# Patient Record
Sex: Male | Born: 1985
Health system: Southern US, Community
[De-identification: ages and names within clinical notes are randomized; demographics above are authoritative.]

## PROBLEM LIST (undated history)

## (undated) DIAGNOSIS — Z8719 Personal history of other diseases of the digestive system: Secondary | ICD-10-CM

## (undated) DIAGNOSIS — R011 Cardiac murmur, unspecified: Secondary | ICD-10-CM

## (undated) DIAGNOSIS — K279 Peptic ulcer, site unspecified, unspecified as acute or chronic, without hemorrhage or perforation: Secondary | ICD-10-CM

## (undated) DIAGNOSIS — K2210 Ulcer of esophagus without bleeding: Secondary | ICD-10-CM

## (undated) DIAGNOSIS — F329 Major depressive disorder, single episode, unspecified: Secondary | ICD-10-CM

## (undated) DIAGNOSIS — R519 Headache, unspecified: Secondary | ICD-10-CM

## (undated) DIAGNOSIS — D649 Anemia, unspecified: Secondary | ICD-10-CM

## (undated) DIAGNOSIS — Z992 Dependence on renal dialysis: Secondary | ICD-10-CM

## (undated) DIAGNOSIS — K209 Esophagitis, unspecified without bleeding: Secondary | ICD-10-CM

## (undated) DIAGNOSIS — R112 Nausea with vomiting, unspecified: Secondary | ICD-10-CM

## (undated) DIAGNOSIS — Z9889 Other specified postprocedural states: Secondary | ICD-10-CM

## (undated) DIAGNOSIS — N289 Disorder of kidney and ureter, unspecified: Secondary | ICD-10-CM

## (undated) DIAGNOSIS — G4700 Insomnia, unspecified: Secondary | ICD-10-CM

## (undated) DIAGNOSIS — F419 Anxiety disorder, unspecified: Secondary | ICD-10-CM

## (undated) DIAGNOSIS — N189 Chronic kidney disease, unspecified: Secondary | ICD-10-CM

## (undated) DIAGNOSIS — F32A Depression, unspecified: Secondary | ICD-10-CM

## (undated) DIAGNOSIS — G629 Polyneuropathy, unspecified: Secondary | ICD-10-CM

## (undated) DIAGNOSIS — R569 Unspecified convulsions: Secondary | ICD-10-CM

## (undated) DIAGNOSIS — R109 Unspecified abdominal pain: Secondary | ICD-10-CM

## (undated) DIAGNOSIS — E1043 Type 1 diabetes mellitus with diabetic autonomic (poly)neuropathy: Secondary | ICD-10-CM

## (undated) DIAGNOSIS — M419 Scoliosis, unspecified: Secondary | ICD-10-CM

## (undated) DIAGNOSIS — K3184 Gastroparesis: Secondary | ICD-10-CM

## (undated) DIAGNOSIS — M1990 Unspecified osteoarthritis, unspecified site: Secondary | ICD-10-CM

## (undated) DIAGNOSIS — M549 Dorsalgia, unspecified: Secondary | ICD-10-CM

## (undated) DIAGNOSIS — I10 Essential (primary) hypertension: Secondary | ICD-10-CM

## (undated) DIAGNOSIS — J189 Pneumonia, unspecified organism: Secondary | ICD-10-CM

## (undated) DIAGNOSIS — N186 End stage renal disease: Secondary | ICD-10-CM

## (undated) DIAGNOSIS — R51 Headache: Secondary | ICD-10-CM

## (undated) DIAGNOSIS — K219 Gastro-esophageal reflux disease without esophagitis: Secondary | ICD-10-CM

## (undated) DIAGNOSIS — E7800 Pure hypercholesterolemia, unspecified: Secondary | ICD-10-CM

## (undated) DIAGNOSIS — Q231 Congenital insufficiency of aortic valve: Secondary | ICD-10-CM

## (undated) DIAGNOSIS — E119 Type 2 diabetes mellitus without complications: Secondary | ICD-10-CM

## (undated) DIAGNOSIS — Q2381 Bicuspid aortic valve: Secondary | ICD-10-CM

## (undated) DIAGNOSIS — G8929 Other chronic pain: Secondary | ICD-10-CM

## (undated) HISTORY — PX: TYMPANOSTOMY TUBE PLACEMENT: SHX32

## (undated) HISTORY — PX: WISDOM TOOTH EXTRACTION: SHX21

## (undated) HISTORY — PX: EYE SURGERY: SHX253

## (undated) HISTORY — DX: Peptic ulcer, site unspecified, unspecified as acute or chronic, without hemorrhage or perforation: K27.9

---
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
## 2007-01-07 ENCOUNTER — Emergency Department (HOSPITAL_COMMUNITY): Admission: EM | Admit: 2007-01-07 | Discharge: 2007-01-08 | Payer: Self-pay | Admitting: Emergency Medicine

## 2007-02-07 ENCOUNTER — Emergency Department (HOSPITAL_COMMUNITY): Admission: EM | Admit: 2007-02-07 | Discharge: 2007-02-07 | Payer: Self-pay | Admitting: Emergency Medicine

## 2007-02-16 ENCOUNTER — Emergency Department (HOSPITAL_COMMUNITY): Admission: EM | Admit: 2007-02-16 | Discharge: 2007-02-16 | Payer: Self-pay | Admitting: Emergency Medicine

## 2007-02-23 ENCOUNTER — Emergency Department (HOSPITAL_COMMUNITY): Admission: EM | Admit: 2007-02-23 | Discharge: 2007-02-24 | Payer: Self-pay | Admitting: Emergency Medicine

## 2007-04-05 ENCOUNTER — Inpatient Hospital Stay (HOSPITAL_COMMUNITY): Admission: EM | Admit: 2007-04-05 | Discharge: 2007-04-07 | Payer: Self-pay | Admitting: Emergency Medicine

## 2009-07-06 ENCOUNTER — Encounter (INDEPENDENT_AMBULATORY_CARE_PROVIDER_SITE_OTHER): Payer: Self-pay | Admitting: Internal Medicine

## 2009-07-06 ENCOUNTER — Ambulatory Visit: Payer: Self-pay | Admitting: Cardiology

## 2010-01-13 ENCOUNTER — Inpatient Hospital Stay (HOSPITAL_COMMUNITY): Admission: EM | Admit: 2010-01-13 | Discharge: 2009-07-07 | Payer: Self-pay | Admitting: Emergency Medicine

## 2010-02-08 ENCOUNTER — Emergency Department (HOSPITAL_COMMUNITY)
Admission: EM | Admit: 2010-02-08 | Discharge: 2010-02-08 | Payer: Managed Care, Other (non HMO) | Source: Home / Self Care | Admitting: Emergency Medicine

## 2010-04-14 ENCOUNTER — Emergency Department (HOSPITAL_COMMUNITY): Payer: Managed Care, Other (non HMO)

## 2010-04-14 ENCOUNTER — Emergency Department (HOSPITAL_COMMUNITY)
Admission: EM | Admit: 2010-04-14 | Discharge: 2010-04-14 | Disposition: A | Discharge status: Home / Self Care | Payer: Managed Care, Other (non HMO) | Attending: Emergency Medicine | Admitting: Emergency Medicine

## 2010-04-14 DIAGNOSIS — S83106A Unspecified dislocation of unspecified knee, initial encounter: Secondary | ICD-10-CM | POA: Insufficient documentation

## 2010-04-14 DIAGNOSIS — X500XXA Overexertion from strenuous movement or load, initial encounter: Secondary | ICD-10-CM | POA: Insufficient documentation

## 2010-04-14 DIAGNOSIS — Y92009 Unspecified place in unspecified non-institutional (private) residence as the place of occurrence of the external cause: Secondary | ICD-10-CM | POA: Insufficient documentation

## 2010-04-14 LAB — GLUCOSE, CAPILLARY
Glucose-Capillary: 597 mg/dL (ref 70–99)
Glucose-Capillary: 314 mg/dL — ABNORMAL HIGH (ref 70–99)

## 2010-04-14 LAB — POCT I-STAT, CHEM 8: HCT: 45 % (ref 39.0–52.0)

## 2010-04-18 LAB — DIFFERENTIAL
Eosinophils Absolute: 0.1 10*3/uL (ref 0.0–0.7)
Eosinophils Relative: 1 % (ref 0–5)
Lymphocytes Relative: 34 % (ref 12–46)
Neutrophils Relative %: 60 % (ref 43–77)

## 2010-04-18 LAB — URINALYSIS, ROUTINE W REFLEX MICROSCOPIC
Glucose, UA: 500 mg/dL — AB
Protein, ur: 30 mg/dL — AB
Specific Gravity, Urine: >1.03 — ABNORMAL HIGH (ref 1.005–1.030)
Urobilinogen, UA: 0.2 mg/dL (ref 0.0–1.0)

## 2010-04-18 LAB — GLUCOSE, CAPILLARY
Glucose-Capillary: 229 mg/dL — ABNORMAL HIGH (ref 70–99)
Glucose-Capillary: 324 mg/dL — ABNORMAL HIGH (ref 70–99)

## 2010-04-18 LAB — COMPREHENSIVE METABOLIC PANEL
ALT: 17 U/L (ref 0–53)
Albumin: 5.2 g/dL (ref 3.5–5.2)
Alkaline Phosphatase: 88 U/L (ref 39–117)
BUN: 12 mg/dL (ref 6–23)
Calcium: 10 mg/dL (ref 8.4–10.5)
Creatinine, Ser: 1.43 mg/dL (ref 0.4–1.5)
Glucose, Bld: 336 mg/dL — ABNORMAL HIGH (ref 70–99)
Potassium: 4.2 mEq/L (ref 3.5–5.1)
Total Bilirubin: 1.8 mg/dL — ABNORMAL HIGH (ref 0.3–1.2)
Total Protein: 9.5 g/dL — ABNORMAL HIGH (ref 6.0–8.3)

## 2010-04-18 LAB — CBC
Hemoglobin: 17.4 g/dL — ABNORMAL HIGH (ref 13.0–17.0)
MCHC: 36.9 g/dL — ABNORMAL HIGH (ref 30.0–36.0)

## 2010-04-25 ENCOUNTER — Other Ambulatory Visit (HOSPITAL_COMMUNITY): Payer: Self-pay | Admitting: Orthopaedic Surgery

## 2010-04-25 DIAGNOSIS — M25569 Pain in unspecified knee: Secondary | ICD-10-CM

## 2010-04-25 LAB — URINALYSIS, ROUTINE W REFLEX MICROSCOPIC
Glucose, UA: >1000 mg/dL — AB
Leukocytes, UA: NEGATIVE
Nitrite: NEGATIVE
Protein, ur: NEGATIVE mg/dL
Urobilinogen, UA: 0.2 mg/dL (ref 0.0–1.0)

## 2010-04-25 LAB — CULTURE, BLOOD (ROUTINE X 2)
Report Status: 6052011
Report Status: 6052011

## 2010-04-25 LAB — BASIC METABOLIC PANEL
BUN: 10 mg/dL (ref 6–23)
BUN: 12 mg/dL (ref 6–23)
BUN: 12 mg/dL (ref 6–23)
BUN: 13 mg/dL (ref 6–23)
BUN: 13 mg/dL (ref 6–23)
CO2: 23 mEq/L (ref 19–32)
CO2: 27 mEq/L (ref 19–32)
CO2: 29 mEq/L (ref 19–32)
Calcium: 8.6 mg/dL (ref 8.4–10.5)
Calcium: 8.7 mg/dL (ref 8.4–10.5)
Calcium: 9.6 mg/dL (ref 8.4–10.5)
Chloride: 104 mEq/L (ref 96–112)
Chloride: 104 mEq/L (ref 96–112)
Chloride: 95 mEq/L — ABNORMAL LOW (ref 96–112)
Chloride: 96 mEq/L (ref 96–112)
Creatinine, Ser: 0.69 mg/dL (ref 0.4–1.5)
Creatinine, Ser: 0.69 mg/dL (ref 0.4–1.5)
Creatinine, Ser: 0.71 mg/dL (ref 0.4–1.5)
Creatinine, Ser: 0.76 mg/dL (ref 0.4–1.5)
Creatinine, Ser: 0.93 mg/dL (ref 0.4–1.5)
GFR calc Af Amer: >60 mL/min (ref >60)
GFR calc Af Amer: >60 mL/min (ref >60)
GFR calc Af Amer: >60 mL/min (ref >60)
GFR calc non Af Amer: >60 mL/min (ref >60)
GFR calc non Af Amer: >60 mL/min (ref >60)
GFR calc non Af Amer: >60 mL/min (ref >60)
GFR calc non Af Amer: >60 mL/min (ref >60)
Glucose, Bld: 179 mg/dL — ABNORMAL HIGH (ref 70–99)
Glucose, Bld: 151 mg/dL — ABNORMAL HIGH (ref 70–99)
Glucose, Bld: 252 mg/dL — ABNORMAL HIGH (ref 70–99)
Glucose, Bld: 418 mg/dL — ABNORMAL HIGH (ref 70–99)
Glucose, Bld: 482 mg/dL — ABNORMAL HIGH (ref 70–99)
Potassium: 3 mEq/L — ABNORMAL LOW (ref 3.5–5.1)
Potassium: 3.1 mEq/L — ABNORMAL LOW (ref 3.5–5.1)
Potassium: 3.6 mEq/L (ref 3.5–5.1)
Potassium: 4 mEq/L (ref 3.5–5.1)
Sodium: 138 mEq/L (ref 135–145)
Sodium: 134 mEq/L — ABNORMAL LOW (ref 135–145)
Sodium: 137 mEq/L (ref 135–145)
Sodium: 139 mEq/L (ref 135–145)

## 2010-04-25 LAB — GLUCOSE, CAPILLARY
Glucose-Capillary: 193 mg/dL — ABNORMAL HIGH (ref 70–99)
Glucose-Capillary: 127 mg/dL — ABNORMAL HIGH (ref 70–99)
Glucose-Capillary: 144 mg/dL — ABNORMAL HIGH (ref 70–99)
Glucose-Capillary: 152 mg/dL — ABNORMAL HIGH (ref 70–99)
Glucose-Capillary: 169 mg/dL — ABNORMAL HIGH (ref 70–99)
Glucose-Capillary: 215 mg/dL — ABNORMAL HIGH (ref 70–99)
Glucose-Capillary: 254 mg/dL — ABNORMAL HIGH (ref 70–99)
Glucose-Capillary: 271 mg/dL — ABNORMAL HIGH (ref 70–99)
Glucose-Capillary: 335 mg/dL — ABNORMAL HIGH (ref 70–99)
Glucose-Capillary: 421 mg/dL — ABNORMAL HIGH (ref 70–99)

## 2010-04-25 LAB — POCT CARDIAC MARKERS
CKMB, poc: <1 ng/mL — ABNORMAL LOW (ref 1.0–8.0)
Myoglobin, poc: 26.2 ng/mL (ref 12–200)
Troponin i, poc: <0.05 ng/mL (ref 0.00–0.09)

## 2010-04-25 LAB — CBC
HCT: 43.7 % (ref 39.0–52.0)
Hemoglobin: 14.8 g/dL (ref 13.0–17.0)
Platelets: 293 10*3/uL (ref 150–400)
RBC: 4.76 MIL/uL (ref 4.22–5.81)
RDW: 12.4 % (ref 11.5–15.5)

## 2010-04-25 LAB — DIFFERENTIAL
Basophils Relative: 0 % (ref 0–1)
Lymphocytes Relative: 15 % (ref 12–46)
Monocytes Absolute: 0.4 10*3/uL (ref 0.1–1.0)
Monocytes Relative: 4 % (ref 3–12)
Neutro Abs: 9.7 10*3/uL — ABNORMAL HIGH (ref 1.7–7.7)
Neutrophils Relative %: 81 % — ABNORMAL HIGH (ref 43–77)

## 2010-04-25 LAB — LIPID PANEL
Cholesterol: 187 mg/dL (ref 0–200)
HDL: 33 mg/dL — ABNORMAL LOW (ref >39)
LDL Cholesterol: 110 mg/dL — ABNORMAL HIGH (ref 0–99)
Total CHOL/HDL Ratio: 5.7 RATIO

## 2010-04-25 LAB — CARDIAC PANEL(CRET KIN+CKTOT+MB+TROPI)
CK, MB: 0.7 ng/mL (ref 0.3–4.0)
Relative Index: INVALID (ref 0.0–2.5)
Relative Index: INVALID (ref 0.0–2.5)
Total CK: 41 U/L (ref 7–232)
Total CK: 45 U/L (ref 7–232)
Total CK: 48 U/L (ref 7–232)
Troponin I: 0.01 ng/mL (ref 0.00–0.06)

## 2010-04-25 LAB — RAPID URINE DRUG SCREEN, HOSP PERFORMED
Amphetamines: NOT DETECTED
Opiates: NOT DETECTED
Tetrahydrocannabinol: NOT DETECTED

## 2010-04-25 LAB — URINE MICROSCOPIC-ADD ON

## 2010-04-25 LAB — PHOSPHORUS: Phosphorus: 2.6 mg/dL (ref 2.3–4.6)

## 2010-04-25 LAB — URINE CULTURE
Colony Count: NO GROWTH
Culture: NO GROWTH
Special Requests: NEGATIVE

## 2010-04-26 ENCOUNTER — Other Ambulatory Visit (HOSPITAL_COMMUNITY): Payer: Managed Care, Other (non HMO)

## 2010-04-28 ENCOUNTER — Ambulatory Visit (HOSPITAL_COMMUNITY)
Admission: RE | Admit: 2010-04-28 | Discharge: 2010-04-28 | Disposition: A | Discharge status: Home / Self Care | Payer: Managed Care, Other (non HMO) | Source: Ambulatory Visit | Attending: Orthopaedic Surgery | Admitting: Orthopaedic Surgery

## 2010-04-28 DIAGNOSIS — M949 Disorder of cartilage, unspecified: Secondary | ICD-10-CM | POA: Insufficient documentation

## 2010-04-28 DIAGNOSIS — M25469 Effusion, unspecified knee: Secondary | ICD-10-CM | POA: Insufficient documentation

## 2010-04-28 DIAGNOSIS — M899 Disorder of bone, unspecified: Secondary | ICD-10-CM | POA: Insufficient documentation

## 2010-04-28 DIAGNOSIS — M25569 Pain in unspecified knee: Secondary | ICD-10-CM | POA: Insufficient documentation

## 2010-06-21 NOTE — H&P (Signed)
NAME:  Brent Daniels, Brent Daniels NO.:  [id number]   MEDICAL RECORD NO.:  [id number]          PATIENT TYPE:  EMS   LOCATION:  ED                            FACILITY:  [facility]   PHYSICIAN:  Bonnielee Haff, MD     DATE OF BIRTH:  November 27, 1985   DATE OF ADMISSION:  04/05/2007  DATE OF DISCHARGE:  LH                              HISTORY & PHYSICAL   PRIMARY CARE PHYSICIAN:  Dr. Theda Belfast in [place], [place], and he saw  Dr. Theda Belfast about two weeks ago.   ADMISSION DIAGNOSES:  1. Severe diabetic ketoacidosis.  2. Leukocytosis of unclear etiology.  3. Mild acute renal failure.   CHIEF COMPLAINT:  Nausea and vomiting, diarrhea since this morning.   HISTORY OF PRESENT ILLNESS:  The patient is a 25 year old Caucasian male  who has a history of type 1 diabetes who unfortunately is not very  compliant and is not on any basal insulin.  He takes only sliding scale  insulin.  He was in his usual state of health until last night and  actually early this morning when he started having nausea, vomiting, and  diarrhea.  Apparently his sister is also sick with similar complaints.  He got worse this morning, his blood sugars went very high, and then he  decided to come into the hospital.  He is complaining of some acid  reflux, some pain in the right side of his chest after he started  vomiting.  He denies any fever or chills and denies any abdominal pain  at this time.  Upon asking him why he does not take Lantus, he said it  caused him cold sweats and so he discontinued it about six months ago.  He was taking about 32 units on a daily basis.   MEDICATIONS:  Currently he is just on Humalog and a sliding scale.   ALLERGIES:  No known drug allergies.   PAST MEDICAL HISTORY:  Positive for insulin-dependent diabetes mellitus  for the past 13 years.  He has not had any surgeries in the past and no  other medical problems.   SOCIAL HISTORY:  He lives in [place], [place], with his mother.   He  smokes a pack of cigarettes on a daily basis.  No alcohol use and no  illicit drug use.  He lost his job just a day ago and he was working in  [employer]ishing [employer].   FAMILY HISTORY:  Unremarkable for diabetes.   REVIEW OF SYSTEMS:  Difficult to do as the patient is not very  cooperative at this time.  He is somewhat in an agitated state  requesting something to drink.   PHYSICAL EXAMINATION:  VITAL SIGNS:  Temperature 97.8, blood pressure  160/92, heart rate 130s and regular, respiratory rate 20, saturations  99% on room air.  GENERAL:  Thin white male agitated, but in no distress.  HEENT:  No pallor and no icterus.  Oral mucosa is very dry.  Enlarged  tonsils are noted and does not appear to be inflamed.  NECK:  Soft and supple.  No thyromegaly  is appreciated.  LUNGS:  Clear to auscultation bilaterally with no wheezing, rales, or  rhonchi.  CARDIOVASCULAR:  S1 and S2 is normal and regular, no murmurs  appreciated.  No S3 or S4.  No rubs are noted.  No bruits appreciated.  ABDOMEN:  Soft, nontender, and nondistended.  Bowel sounds present.  No  mass or organomegaly is appreciated.  CHEST:  Right chest is tender to palpation.  The pain is reproducible.  NEUROLOGY:  The patient is alert and agitated, but oriented to time,  place, and person.  No neurological deficits are present.   LABORATORY DATA:  White count is 24,500, with 84% neutrophils,  hemoglobin 16.8, platelet count 373.  Sodium 127, potassium is 5.9,  chloride 88, bicarb 10, glucose 742, BUN 13, creatinine 1.9, calcium  11.1.  His corrected sodium is 137.  His anion gap is 29.   No imaging studies have been done yet.   ASSESSMENT:  This is a 25 year old Caucasian male with type 1 diabetes  who presents with a few-hour history of nausea, vomiting, and diarrhea,  who presents with hyperglycemia.  The patient has severe diabetic  ketoacidosis.  He probably has gastroenteritis as he has contact  exposure to similar  symptoms.  He has leukocytosis, possibly from  dehydration rather than infection.  He has hyperkalemia and other  metabolic disturbances.   PLAN:  1. Diabetic ketoacidosis.  He will be admitted to the telemetry floor      on diabetic ketoacidosis protocol with IV insulin and IV fluids.      The goal will be to get his blood sugar quickly under control and      get his acidosis under control as well.  He will be kept NPO.  The      patient is requesting something to eat and drink and is somewhat      belligerent about this.  I have made it very clear to him the      importance of being NPO at this time.  2. His metabolic disturbances should hopefully correct with IV fluids      and control of his blood sugars.  We will keep a close watch on his      metabolic profile every two hours.  3. For his leukocytosis, I will empirically give him one dose of      Levaquin, recheck his labs tomorrow.  Blood cultures will be sent      if he becomes febrile.  UA will be checked when he starts making      urine.  If he does not urinate in the next 3-4 hours a Foley will      be placed.  I will also check LFT's, amylase, and lipase to make      sure there is no pancreatitis or other hepatic abnormalities.  4. His right chest pain is probably coming from the process of his      emesis, but I will get a chest x-ray to be sure there is no other      reason for his chest pain.  5. Acute renal failure should improve with IV hydration.   I have told him the importance of taking some kind of basal insulin such  as Lantus to ensure that this does not happen again.      Bonnielee Haff, MD  Electronically Signed     GK/MEDQ  D:  04/05/2007  T:  04/05/2007  Job:  [id number]

## 2010-06-21 NOTE — Discharge Summary (Signed)
NAME:  Brent Daniels, Brent Daniels NO.:  [id number]   MEDICAL RECORD NO.:  [id number]          PATIENT TYPE:  INP   LOCATION:  A326                          FACILITY:  [facility]   PHYSICIAN:  Bonnielee Haff, MD     DATE OF BIRTH:  1985/02/22   DATE OF ADMISSION:  04/05/2007  DATE OF DISCHARGE:  03/01/2009LH                               DISCHARGE SUMMARY   PRIMARY MEDICAL DOCTOR:  Dr. Wenda Overland in [place], [place].   DISCHARGE DIAGNOSES:  1. Diabetic ketoacidosis.  2. Gastroenteritis.   BRIEF HOSPITAL COURSE:  Briefly, this is a 25 year old Caucasian male  who has type 1 diabetes, who presented with complaints of nausea,  vomiting and diarrhea.  The patient's sister had similar complaints as  well prior to his onset of symptoms.  The patient started having  gastroenteritis, which triggered his DKA.  He was found to have elevated  anion gap and his blood sugars were 742.  The patient was admitted to  the hospital, put on an insulin drip, which was transitioned to Lantus.  The patient told at the time of admission that he was not taking Lantus  for the past 6 month because it did not suit him.  I have told him the  importance of continuing to take Lantus insulin, which he has tolerated  during this hospitalization.  I have told him the importance of having  some kind of a basal insulin in his body; otherwise, he can again  relapse into a DKA and which could potentially be very harmful to his  health.  He seems to understand now.   His gastroenteritis symptoms have also resolved with symptomatic  treatment.   He was found to have a leukocytosis of 24,000, though he was afebrile.  His UA did not suggest any infection.  He was put on Levaquin for 3  days; his white count has come down to 11 today.  I think his elevated  white count was a combination of gastroenteritis and the fact that he  was extremely dehydrated.   He also had an elevated lipase of 130, but his amylase was  normal.  It  was felt that his lipase was elevated because of nausea and vomiting  rather than pancreatitis.  A repeat check showed normalization of the  lipase.  He also had when he came in elevated potassium of 5.9, which  normalized and actually this morning went down to 2.8.  We will replace  this aggressively and recheck his potassium this afternoon and if it is  normalizing, we will let him go home.   On the day of discharge, the patient is feeling well, no nausea,  vomiting or diarrhea, no abdominal pain.  He is tolerating his p.o.  intake.  His blood sugars show these have been running 229, 240, 191.   Examination was unremarkable.   We will plan to increase the Lantus, replace his potassium and hopefully  he will be able to go home later today.   DISCHARGE MEDICATIONS:  1. Lantus insulin 20 units subcu nightly.  2. Prilosec 20 mg  daily for 4 weeks.  3. Humalog on a sliding scale.   FOLLOWUP:  Dr. Wenda Overland within a week.   DIET:  Modified carbohydrate.   PHYSICAL ACTIVITY:  No restrictions.   COMMENT:  Providing his potassium comes up to within normal range, he  will be discharged later today.   TOTAL TIME SPENT ON THIS DISCHARGE ENCOUNTER:  About 35 inches.   COMMENT:  Please note above is preliminary until signed.      Bonnielee Haff, MD  Electronically Signed     GK/MEDQ  D:  04/07/2007  T:  04/07/2007  Job:  [id number]   cc:   Magda Paganini MD

## 2010-09-18 ENCOUNTER — Emergency Department (HOSPITAL_COMMUNITY): Payer: Managed Care, Other (non HMO)

## 2010-09-18 ENCOUNTER — Emergency Department (HOSPITAL_COMMUNITY)
Admission: EM | Admit: 2010-09-18 | Discharge: 2010-09-18 | Disposition: A | Discharge status: Home / Self Care | Payer: Managed Care, Other (non HMO) | Attending: Emergency Medicine | Admitting: Emergency Medicine

## 2010-09-18 ENCOUNTER — Encounter: Payer: Self-pay | Admitting: Emergency Medicine

## 2010-09-18 DIAGNOSIS — Z794 Long term (current) use of insulin: Secondary | ICD-10-CM | POA: Insufficient documentation

## 2010-09-18 DIAGNOSIS — W540XXA Bitten by dog, initial encounter: Secondary | ICD-10-CM | POA: Insufficient documentation

## 2010-09-18 DIAGNOSIS — E119 Type 2 diabetes mellitus without complications: Secondary | ICD-10-CM | POA: Insufficient documentation

## 2010-09-18 DIAGNOSIS — S61409A Unspecified open wound of unspecified hand, initial encounter: Secondary | ICD-10-CM | POA: Insufficient documentation

## 2010-09-18 HISTORY — DX: Cardiac murmur, unspecified: R01.1

## 2010-09-18 MED ORDER — HYDROCODONE-ACETAMINOPHEN 5-325 MG PO TABS
2.0000 | ORAL_TABLET | Freq: Once | ORAL | Status: AC
  Administered 2010-09-18: 2 via ORAL
  Filled 2010-09-18: qty 2

## 2010-09-18 MED ORDER — AMOXICILLIN-POT CLAVULANATE 875-125 MG PO TABS: 1.0000 | ORAL_TABLET | Freq: Two times a day (BID) | ORAL | Status: AC

## 2010-09-18 MED ORDER — HYDROCODONE-ACETAMINOPHEN 5-325 MG PO TABS: 2.0000 | ORAL_TABLET | ORAL | Status: AC | PRN

## 2010-09-18 NOTE — ED Notes (Signed)
Med admin per [facility] and MD order, pt to Xray.

## 2010-09-18 NOTE — ED Provider Notes (Signed)
Scribed for Brent Essex, MD, the patient was seen in room 8. This chart was scribed by Pincus Badder. This patient's care was started at 09:32.   CSN: [id number] Arrival date & time: 09/18/2010  9:05 AM  Chief Complaint  Patient presents with  . Animal Bite   HPI Brent Daniels is a 25 y.o. male with a history of diabetes mellitus currently on Insulin who presents to the Emergency Department complaining of pain and puncture wounds to right hand status post dog bite. Patient has two pit bull terriers at home. States last night the two dogs were fighting, and in attempt to separate them, the dog bit him on the right hand. Patient reports puncture wounds and multiple abrasions on his right hand, and pain is exacerbated by movement. He denies any distal weakness or sensory problems. Tetanus is up to date, ~2-3 years ago. There are no other associated symptoms and no other alleviating or aggravating factors.   HPI ELEMENTS:  Location: Right hand  Onset: last night Timing: constant   Severity: "9"/10  Modifying factors: aggravating by movement  Context: as above   Past Medical History  Diagnosis Date  . Diabetes mellitus   . Heart murmur     Past Surgical History  Procedure Date  . Tympanostomy tube placement     Family History  Problem Relation Age of Onset  . Cancer Father    MEDICATIONS:  Insulin  Previous Medications   INSULIN REGULAR (HUMULIN R,NOVOLIN R) 100 UNITS/ML INJECTION    Inject into the skin as directed. Sliding Scale. Inject required units based on Blood Glucose levels up to 5 times daily with meals.     ALLERGIES:  Allergies as of 09/18/2010  . (No Known Allergies)     History  Substance Use Topics  . Smoking status: Current Everyday Smoker -- 0.5 packs/day    Types: Cigarettes  . Smokeless tobacco: Former Systems developer    Quit date: 09/17/2000  . Alcohol Use: No    Review of Systems  Musculoskeletal:       Right hand pain with puncture wounds/abrasions  from dog bite   All other systems reviewed and are negative.    Physical Exam  BP 145/101  Pulse 93  Temp(Src) 98.4 F (36.9 C) (Oral)  Resp 17  Ht 6\' 2"  (1.88 m)  Wt 185 lb (83.915 kg)  BMI 23.75 kg/m2  SpO2 98%  Physical Exam  Constitutional: He is oriented to person, place, and time. He appears well-developed and well-nourished. No distress.  HENT:  Head: Normocephalic and atraumatic.  Eyes: Pupils are equal, round, and reactive to light.  Neck: Neck supple.  Cardiovascular: Intact distal pulses.   Pulses:      Radial pulses are 2+ on the right side, and 2+ on the left side.  Pulmonary/Chest: Effort normal.  Abdominal: He exhibits no distension.  Musculoskeletal: Normal range of motion.       Arms:      +2 radial pulse. Intact motor and sensation right hand. Carpal hands movements are intact  Neurological: He is alert and oriented to person, place, and time. He has normal strength. No sensory deficit.  Skin: Skin is warm and dry.  Psychiatric: He has a normal mood and affect. His behavior is normal.    Procedures  OTHER DATA REVIEWED: Nursing notes, vital signs, and past medical records reviewed.   DIAGNOSTIC STUDIES: Oxygen Saturation is 98% on room air, normal  by my interpretation.    LABS:  Results for orders placed during the hospital encounter of 09/18/10  GLUCOSE, CAPILLARY      Component Value Range   Glucose-Capillary 268 (*) 70 - 99 (mg/dL)    RADIOLOGY:  XRAY RIGHT HAND: 3 View; Interpreted by Radiologist Dr. Julian Hy and reviewed by me: No fracture or dislocation is seen.   ED COURSE / COORDINATION OF CARE: 09:35 - ED physician informed the patient that dog bite wounds are unsuturable in order to prevent infection. XRAy right hand ordered.  10:41 - XRAY shows no acute abnormalities. Patient stable for discharge.   MDM: animal bite, bleeding controlled and neurovascularly intact.  Tetanus UTD.  Xray, pain control, clean  wounds.   IMPRESSION: Diagnoses that have been ruled out:  Diagnoses that are still under consideration:  Final diagnoses:  Dog bite    PLAN:  Home  The patient is to return the emergency department if there is any worsening of symptoms. I have reviewed the discharge instructions with the patient.    CONDITION ON DISCHARGE: Stable   MEDICATIONS GIVEN IN THE E.D.  Medications  insulin regular (HUMULIN R,NOVOLIN R) 100 units/mL injection (not administered)  amoxicillin-clavulanate (AUGMENTIN) 875-125 MG per tablet (not administered)  HYDROcodone-acetaminophen (NORCO) 5-325 MG per tablet (not administered)  HYDROcodone-acetaminophen (NORCO) 5-325 MG per tablet 2 tablet (2 tablet Oral Given 09/18/10 0942)     DISCHARGE MEDICATIONS: New Prescriptions   AMOXICILLIN-CLAVULANATE (AUGMENTIN) 875-125 MG PER TABLET    Take 1 tablet by mouth every 12 (twelve) hours.   HYDROCODONE-ACETAMINOPHEN (NORCO) 5-325 MG PER TABLET    Take 2 tablets by mouth every 4 (four) hours as needed for pain.   I personally performed the services described in this documentation, which was scribed in my presence.  The recorded information has been reviewed and considered.     Brent Essex, MD 09/18/10 1425

## 2010-09-18 NOTE — ED Notes (Signed)
Pt bitten by pit bull around 1230 this am. C/O numbness in last 2 digits, unable to move last digit of R hand.

## 2010-10-27 LAB — URINALYSIS, ROUTINE W REFLEX MICROSCOPIC
Bilirubin Urine: NEGATIVE
Ketones, ur: NEGATIVE
Protein, ur: NEGATIVE
Urobilinogen, UA: 0.2

## 2010-10-27 LAB — BASIC METABOLIC PANEL
CO2: 29
Calcium: 10.2
Creatinine, Ser: 0.82
GFR calc Af Amer: >60
GFR calc non Af Amer: >60
Glucose, Bld: 474 — ABNORMAL HIGH

## 2010-10-28 LAB — BASIC METABOLIC PANEL
BUN: 15
CO2: 10 — ABNORMAL LOW
CO2: 19
Calcium: 10
Calcium: 11.1 — ABNORMAL HIGH
Calcium: 9.9
Chloride: 102
Chloride: 103
Chloride: 88 — ABNORMAL LOW
Creatinine, Ser: 1.28
Creatinine, Ser: 1.38
Creatinine, Ser: 1.45
Creatinine, Ser: 1.65 — ABNORMAL HIGH
GFR calc Af Amer: 53 — ABNORMAL LOW
GFR calc Af Amer: >60
GFR calc Af Amer: >60
GFR calc Af Amer: >60
GFR calc Af Amer: >60
GFR calc Af Amer: >60
GFR calc Af Amer: >60
GFR calc non Af Amer: 52 — ABNORMAL LOW
GFR calc non Af Amer: 59 — ABNORMAL LOW
GFR calc non Af Amer: >60
Potassium: 3.6
Potassium: 4.2
Potassium: 4.2
Potassium: 5.2 — ABNORMAL HIGH
Potassium: 5.9 — ABNORMAL HIGH
Sodium: 127 — ABNORMAL LOW
Sodium: 133 — ABNORMAL LOW
Sodium: 134 — ABNORMAL LOW
Sodium: 134 — ABNORMAL LOW
Sodium: 135
Sodium: 136

## 2010-10-28 LAB — DIFFERENTIAL
Basophils Relative: 0
Eosinophils Relative: 0
Lymphocytes Relative: 10 — ABNORMAL LOW
Lymphs Abs: 2.1
Monocytes Absolute: 1.2 — ABNORMAL HIGH
Monocytes Relative: 5
Neutro Abs: 20.5 — ABNORMAL HIGH

## 2010-10-28 LAB — CBC
HCT: 42.3
HCT: 49.1
Hemoglobin: 14.9
Hemoglobin: 16.8
MCHC: 34.2
MCV: 92.5
Platelets: 300
RBC: 4.7
RBC: 5.31
WBC: 22 — ABNORMAL HIGH

## 2010-10-28 LAB — URINALYSIS, ROUTINE W REFLEX MICROSCOPIC
Specific Gravity, Urine: 1.025
Urobilinogen, UA: 0.2

## 2010-10-28 LAB — HEPATIC FUNCTION PANEL
ALT: 18
Alkaline Phosphatase: 92
Indirect Bilirubin: 1.3 — ABNORMAL HIGH
Total Protein: 6.9

## 2010-10-28 LAB — MAGNESIUM: Magnesium: 2.3

## 2010-10-28 LAB — AMYLASE: Amylase: 79

## 2010-10-28 LAB — PHOSPHORUS: Phosphorus: 7.4 — ABNORMAL HIGH

## 2010-10-31 LAB — COMPREHENSIVE METABOLIC PANEL
BUN: 5 — ABNORMAL LOW
CO2: 26
Chloride: 100
Creatinine, Ser: 0.97
GFR calc non Af Amer: >60
Total Bilirubin: 1.7 — ABNORMAL HIGH

## 2010-10-31 LAB — DIFFERENTIAL
Blasts: 0
Lymphocytes Relative: 20
Metamyelocytes Relative: 0
Monocytes Relative: 4
Promyelocytes Absolute: 0
nRBC: 0

## 2010-10-31 LAB — CBC
HCT: 39.5
Hemoglobin: 14
MCV: 90.9
RBC: 4.35
WBC: 11 — ABNORMAL HIGH

## 2010-10-31 LAB — POTASSIUM: Potassium: 3.6

## 2011-01-03 ENCOUNTER — Other Ambulatory Visit (HOSPITAL_COMMUNITY): Payer: Self-pay | Admitting: Orthopaedic Surgery

## 2011-01-03 DIAGNOSIS — M25561 Pain in right knee: Secondary | ICD-10-CM

## 2011-01-04 ENCOUNTER — Ambulatory Visit (HOSPITAL_COMMUNITY)
Admission: RE | Admit: 2011-01-04 | Discharge: 2011-01-04 | Disposition: A | Discharge status: Home / Self Care | Payer: Managed Care, Other (non HMO) | Source: Ambulatory Visit | Attending: Orthopaedic Surgery | Admitting: Orthopaedic Surgery

## 2011-01-04 DIAGNOSIS — M25569 Pain in unspecified knee: Secondary | ICD-10-CM | POA: Insufficient documentation

## 2011-01-04 DIAGNOSIS — R937 Abnormal findings on diagnostic imaging of other parts of musculoskeletal system: Secondary | ICD-10-CM | POA: Insufficient documentation

## 2011-01-04 DIAGNOSIS — M25561 Pain in right knee: Secondary | ICD-10-CM

## 2011-01-25 ENCOUNTER — Encounter (HOSPITAL_COMMUNITY): Payer: Self-pay | Admitting: *Deleted

## 2011-01-25 ENCOUNTER — Emergency Department (HOSPITAL_COMMUNITY)
Admission: EM | Admit: 2011-01-25 | Discharge: 2011-01-25 | Disposition: A | Discharge status: Home / Self Care | Payer: Managed Care, Other (non HMO) | Attending: Emergency Medicine | Admitting: Emergency Medicine

## 2011-01-25 ENCOUNTER — Emergency Department (HOSPITAL_COMMUNITY): Payer: Managed Care, Other (non HMO)

## 2011-01-25 DIAGNOSIS — F17200 Nicotine dependence, unspecified, uncomplicated: Secondary | ICD-10-CM | POA: Insufficient documentation

## 2011-01-25 DIAGNOSIS — E119 Type 2 diabetes mellitus without complications: Secondary | ICD-10-CM | POA: Insufficient documentation

## 2011-01-25 DIAGNOSIS — Z794 Long term (current) use of insulin: Secondary | ICD-10-CM | POA: Insufficient documentation

## 2011-01-25 DIAGNOSIS — J40 Bronchitis, not specified as acute or chronic: Secondary | ICD-10-CM | POA: Insufficient documentation

## 2011-01-25 MED ORDER — ALBUTEROL SULFATE (5 MG/ML) 0.5% IN NEBU
2.5000 mg | INHALATION_SOLUTION | Freq: Once | RESPIRATORY_TRACT | Status: AC
  Administered 2011-01-25: 2.5 mg via RESPIRATORY_TRACT
  Filled 2011-01-25: qty 0.5

## 2011-01-25 MED ORDER — DEXAMETHASONE 10 MG/ML FOR PEDIATRIC ORAL USE
10.0000 mg | Freq: Once | INTRAMUSCULAR | Status: AC
  Administered 2011-01-25: 10 mg via ORAL
  Filled 2011-01-25: qty 1

## 2011-01-25 MED ORDER — TRAMADOL HCL 50 MG PO TABS: 50.0000 mg | ORAL_TABLET | Freq: Three times a day (TID) | ORAL | Status: AC | PRN

## 2011-01-25 MED ORDER — KETOROLAC TROMETHAMINE 60 MG/2ML IM SOLN
60.0000 mg | Freq: Once | INTRAMUSCULAR | Status: AC
  Administered 2011-01-25: 60 mg via INTRAMUSCULAR
  Filled 2011-01-25: qty 2

## 2011-01-25 NOTE — ED Provider Notes (Signed)
History     CSN: [id number] Arrival date & time: 01/25/2011  5:58 PM   First MD Initiated Contact with Patient 01/25/11 1806      Chief Complaint  Patient presents with  . Cough    (Consider location/radiation/quality/duration/timing/severity/associated sxs/prior treatment) HPI This young male with insulin-dependent diabetes now presents with 3 days of cough, congestion, generalized discomfort. He notes that his pain is soreness, with worsening when coughing. No relief with OTC medications. No vomiting, no diarrhea. The patient also complains of mild subjective fever. Past Medical History  Diagnosis Date  . Diabetes mellitus   . Heart murmur     Past Surgical History  Procedure Date  . Tympanostomy tube placement     Family History  Problem Relation Age of Onset  . Cancer Father     History  Substance Use Topics  . Smoking status: Current Everyday Smoker -- 0.5 packs/day    Types: Cigarettes  . Smokeless tobacco: Former Systems developer    Quit date: 09/17/2000  . Alcohol Use: No      Review of Systems  Constitutional:       Per HPI, otherwise negative  HENT:       Per HPI, otherwise negative  Eyes: Negative.   Respiratory:       Per HPI, otherwise negative  Cardiovascular:       Per HPI, otherwise negative  Gastrointestinal: Negative for vomiting.  Genitourinary: Negative.   Musculoskeletal:       Per HPI, otherwise negative  Skin: Negative.   Neurological: Negative for syncope.    Allergies  Review of patient's allergies indicates no known allergies.  Home Medications   Current Outpatient Rx  Name Route Sig Dispense Refill  . INSULIN REGULAR HUMAN 100 UNIT/ML IJ SOLN Subcutaneous Inject into the skin as directed. Sliding Scale. Inject required units based on Blood Glucose levels up to 5 times daily with meals.    . TRAMADOL HCL 50 MG PO TABS Oral Take 1 tablet (50 mg total) by mouth every 8 (eight) hours as needed for pain. Maximum dose= 8 tablets per day  15 tablet 0    BP 123/76  Pulse 80  Temp(Src) 98.1 F (36.7 C) (Oral)  Resp 20  Ht 6\' 2"  (1.88 m)  Wt 185 lb (83.915 kg)  BMI 23.75 kg/m2  SpO2 100%  Physical Exam  Nursing note and vitals reviewed. Constitutional: He is oriented to person, place, and time. He appears well-developed and well-nourished.  HENT:  Head: Normocephalic and atraumatic.  Mouth/Throat: Oropharynx is clear and moist. No oropharyngeal exudate.  Eyes: Conjunctivae and EOM are normal.  Cardiovascular: Normal rate and regular rhythm.   Pulmonary/Chest: No stridor. No respiratory distress.  Abdominal: Soft. There is no tenderness.  Musculoskeletal: He exhibits no edema.  Neurological: He is alert and oriented to person, place, and time.  Skin: Skin is warm and dry.  Psychiatric: He has a normal mood and affect.    ED Course  Procedures (including critical care time)  Labs Reviewed - No data to display Dg Chest 2 View  01/25/2011  *RADIOLOGY REPORT*  Clinical Data: Cough  CHEST - 2 VIEW  Comparison: 07/06/2009  Findings: Normal heart size, mediastinal contours, and pulmonary vascularity. Peribronchial thickening with slight hyperaeration. No pulmonary infiltrate, pleural effusion or pneumothorax. No acute osseous findings.  IMPRESSION: Peribronchial thickening and slight hyperaeration, which could represent bronchitis or asthma. No acute infiltrate.  Original Report Authenticated By: Burnetta Sabin, M.D.   Reviewed by  me  1. Bronchitis       MDM  This 25 year old male presents with 3 days of generalized discomfort, cough, congestion. On exam the patient is in no distress though he appears uncomfortable. The patient's vital signs are unremarkable, as is his physical exam. The patient's chest x-ray suggests bronchitis.  The patient received a nebulizer treatment for comfort, as well as Toradol, and per his request was discharged with analgesics to followup with his primary care physician for this episode of  what seems most consistent with bronchitis versus URI        Carmin Muskrat, MD 01/25/11 [phone]

## 2011-01-25 NOTE — ED Notes (Signed)
C/o non-productive cough, nasal congestion, body aches x 3 days; c/o chest pain and abd pain worse with coughing, onset after coughing-states feels like sore muscles.

## 2011-01-25 NOTE — ED Notes (Signed)
Respiratory at the bedside at this time.

## 2011-01-25 NOTE — ED Notes (Signed)
Nonproductive cough x 3 days. States abdomen, chest and back are sore from coughing, "feels like I pulled some muscles" (referring to pervious areas) Denies fever. Sore throat x 1 day from the cough, per. Pt.

## 2011-02-15 ENCOUNTER — Telehealth: Payer: Self-pay | Admitting: Orthopedic Surgery

## 2011-02-15 NOTE — Telephone Encounter (Signed)
Patient called back this afternoon and cancelled the 30-minute ACL evaluation appointment for 02/16/11. States he does not have the finances to cover the "high deductible" that his [employer] requires. He states he is covered as an "adult dependent" on his father's insurance.  He asked what the fee would be; I asked about copay amount on his card, and I also quoted our minimum payment amounts, as he said he originally planned to "pay for the full visit".  He states he is actually unable to pay any upfront amount at all this time.    He said he still can drop off the MRI film and he will bring in his insurance card for [place] to further check.  He said he would be best to re-schedule the appointment in May or June, and apologized for having to cancel.

## 2011-02-16 ENCOUNTER — Ambulatory Visit: Payer: Managed Care, Other (non HMO) | Admitting: Orthopedic Surgery

## 2011-02-20 NOTE — Telephone Encounter (Signed)
Dr Aline Brochure is aware, and referring office, Dr. Luna Glasgow, also aware.

## 2011-05-31 ENCOUNTER — Encounter: Payer: Self-pay | Admitting: Orthopedic Surgery

## 2011-05-31 ENCOUNTER — Ambulatory Visit (INDEPENDENT_AMBULATORY_CARE_PROVIDER_SITE_OTHER): Payer: Managed Care, Other (non HMO) | Admitting: Orthopedic Surgery

## 2011-05-31 VITALS — BP 138/60 | Ht 74.0 in | Wt 173.0 lb

## 2011-05-31 DIAGNOSIS — M23329 Other meniscus derangements, posterior horn of medial meniscus, unspecified knee: Secondary | ICD-10-CM

## 2011-05-31 MED ORDER — HYDROCODONE-ACETAMINOPHEN 7.5-325 MG PO TABS: 1.0000 | ORAL_TABLET | ORAL | Status: DC | PRN

## 2011-05-31 NOTE — Progress Notes (Signed)
Patient ID: Brent Daniels, male   DOB: March 16, 1985, 26 y.o.   MRN: [id number]  Referral from Dr Luna Glasgow   chief complaint RIGHT knee pain  The patient was unloading some furniture, the other person, lost some grip of furniture. He fell down some stairs onto a flexed knee in the spring of 2012 since that time. He is having consistent pain described as sharp and throbbing in the RIGHT knee with increased pain standing, walking, working. He cannot ask pain, painful employment secondary to knee pain, swelling in the knee gives out. His pain level is 10 out of 10. He's had 2 MRIs one in March when in November. Both show ACL abnormalities with what looks like a partial ACL tear in the RIGHT knee.  Review of Systems  Constitutional: Negative.   HENT: Negative.   Eyes: Negative.   Respiratory: Negative.   Cardiovascular: Negative.   Gastrointestinal: Negative.   Genitourinary: Negative.   Musculoskeletal: Positive for joint pain.  Skin: Negative.   Neurological: Negative.   Endo/Heme/Allergies: Negative.   Psychiatric/Behavioral: Negative.     Vital signs are stable as recorded  General appearance is normal  The patient is alert and oriented x3  The patient's mood and affect are normal  Gait assessment: The date is normal without assistive device The cardiovascular exam reveals normal pulses and temperature without edema swelling.  The lymphatic system is negative for palpable lymph nodes  The sensory exam is normal.  There are no pathologic reflexes.  Balance is normal.   Exam of the RIGHT knee shows an intact Lockman pain with the pivot shift maneuver without instability and a positive McMurray sign with medial joint line tenderness. Her range of motion is normal. Collateral ligaments are stable. Muscle tone is excellent. Skin intact.  Exam of the LEFT knee shows an intact Lockman normal range of motion, stability, and strength, with no tenderness or swelling.  An MRI was  obtained and reviewed from March and also of November. They are somewhat inconclusive, although there are bone contusions consistent with ACL injury.  Most likely he had a partial tear. There no meniscal changes noted that his clinical exam is consistent with medial meniscal tear.  I recommend that we do an exam under anesthesia Then follow that with arthroscopic examination of the knee Then, a partial medial meniscectomy versus meniscal repair.  The patient is aware of the risks and benefits of the procedure and agrees to proceed with surgery.   Diagnoses #1 torn medial meniscus. Diagnosis #2 partial ACL tear.   Procedure planned arthroscopy, LEFT knee exam under anesthesia, and partial medial meniscectomy versus meniscal repair Subjective:     Referral from Dr Luna Glasgow   chief complaint RIGHT knee pain  The patient was unloading some furniture, the other person, lost some grip of furniture. He fell down some stairs onto a flexed knee in the spring of 2012 since that time. He is having consistent pain described as sharp and throbbing in the RIGHT knee with increased pain standing, walking, working. He cannot ask pain, painful employment secondary to knee pain, swelling in the knee gives out. His pain level is 10 out of 10. He's had 2 MRIs one in March when in November. Both show ACL abnormalities with what looks like a partial ACL tear in the RIGHT knee.  Review of Systems  Constitutional: Negative.   HENT: Negative.   Eyes: Negative.   Respiratory: Negative.   Cardiovascular: Negative.   Gastrointestinal: Negative.  Genitourinary: Negative.   Musculoskeletal: Positive for joint pain.  Skin: Negative.   Neurological: Negative.   Endo/Heme/Allergies: Negative.   Psychiatric/Behavioral: Negative.     Vital signs are stable as recorded  General appearance is normal  The patient is alert and oriented x3  The patient's mood and affect are normal  Gait assessment: The date  is normal without assistive device The cardiovascular exam reveals normal pulses and temperature without edema swelling.  The lymphatic system is negative for palpable lymph nodes  The sensory exam is normal.  There are no pathologic reflexes.  Balance is normal.   Exam of the RIGHT knee shows an intact Lockman pain with the pivot shift maneuver without instability and a positive McMurray sign with medial joint line tenderness. Her range of motion is normal. Collateral ligaments are stable. Muscle tone is excellent. Skin intact.  Exam of the LEFT knee shows an intact Lockman normal range of motion, stability, and strength, with no tenderness or swelling.  An MRI was obtained and reviewed from March and also of November. They are somewhat inconclusive, although there are bone contusions consistent with ACL injury.  Most likely he had a partial tear. There no meniscal changes noted that his clinical exam is consistent with medial meniscal tear.  I recommend that we do an exam under anesthesia Then follow that with arthroscopic examination of the knee Then, a partial medial meniscectomy versus meniscal repair.  The patient is aware of the risks and benefits of the procedure and agrees to proceed with surgery.   Diagnoses #1 torn medial meniscus. Diagnosis #2 partial ACL tear.   Procedure planned arthroscopy, LEFT knee exam under anesthesia, and partial medial meniscectomy versus meniscal repair

## 2011-05-31 NOTE — Patient Instructions (Addendum)
Right knee arthroscopy examine under anesthesia and partial menisectomy   You have been scheduled for arthroscocpic knee surgery.  All surgeries carry some risk.  Remember you always have the option of continued nonsurgical treatment. However in this situation the risks vs. the benefits favor surgery as the best treatment option. The risks of the surgery includes the following but is not limited to bleeding, infection, pulmonary embolus, death from anesthesia, nerve injury vascular injury or need for further surgery, continued pain.  Specific to this procedure the following risks and complications are rare but possible Stiffness, pain, weakness, giving out  I expect  recovery will be in 3-4 weeks some patients take 6 weeks.  You  will need physical therapy after the procedure   Stop any medications that can cause bleeding such as Ibuprofen, aspirin, naprosyn, etc

## 2011-06-15 ENCOUNTER — Encounter (HOSPITAL_COMMUNITY): Payer: Self-pay | Admitting: Pharmacy Technician

## 2011-06-19 ENCOUNTER — Other Ambulatory Visit: Payer: Self-pay | Admitting: Orthopedic Surgery

## 2011-06-19 ENCOUNTER — Encounter (HOSPITAL_COMMUNITY)
Admission: RE | Admit: 2011-06-19 | Discharge: 2011-06-19 | Payer: Managed Care, Other (non HMO) | Source: Ambulatory Visit

## 2011-06-19 DIAGNOSIS — M23329 Other meniscus derangements, posterior horn of medial meniscus, unspecified knee: Secondary | ICD-10-CM

## 2011-06-19 MED ORDER — HYDROCODONE-ACETAMINOPHEN 7.5-325 MG PO TABS: 1.0000 | ORAL_TABLET | ORAL | Status: DC | PRN

## 2011-06-19 NOTE — Patient Instructions (Signed)
[address]EL[address]  06/19/2011   Your procedure is scheduled on:  5.24.13  Report to [facility] at [place] AM.  Call this number if you have problems the morning of surgery: [phone]   Remember:   Do not eat food:After Midnight.  May have clear liquids:until Midnight .  Clear liquids include soda, tea, black coffee, apple or grape juice, broth.  Take these medicines the morning of surgery with A SIP OF WATER: pain pill if needed. NO INSULIN the day of procedure.   Do not wear jewelry, make-up or nail polish.  Do not wear lotions, powders, or perfumes. You may wear deodorant.  Do not shave 48 hours prior to surgery.  Do not bring valuables to the hospital.  Contacts, dentures or bridgework may not be worn into surgery.  Leave suitcase in the car. After surgery it may be brought to your room.  For patients admitted to the hospital, checkout time is 11:00 AM the day of discharge.   Patients discharged the day of surgery will not be allowed to drive home.  Name and phone number of your driver: family  Special Instructions: CHG Shower Use Special Wash: 1/2 bottle night before surgery and 1/2 bottle morning of surgery.   Please read over the following fact sheets that you were given: Pain Booklet, MRSA Information, Surgical Site Infection Prevention, Anesthesia Post-op Instructions and Care and Recovery After Surgery PATIENT INSTRUCTIONS POST-ANESTHESIA  IMMEDIATELY FOLLOWING SURGERY:  Do not drive or operate machinery for the first twenty four hours after surgery.  Do not make any important decisions for twenty four hours after surgery or while taking narcotic pain medications or sedatives.  If you develop intractable nausea and vomiting or a severe headache please notify your doctor immediately.  FOLLOW-UP:  Please make an appointment with your surgeon as instructed. You do not need to follow up with anesthesia unless specifically instructed to do so.  WOUND CARE INSTRUCTIONS (if  applicable):  Keep a dry clean dressing on the anesthesia/puncture wound site if there is drainage.  Once the wound has quit draining you may leave it open to air.  Generally you should leave the bandage intact for twenty four hours unless there is drainage.  If the epidural site drains for more than 36-48 hours please call the anesthesia department.  QUESTIONS?:  Please feel free to call your physician or the hospital operator if you have any questions, and they will be happy to assist you.         Arthroscopic Procedure, Knee An arthroscopic procedure can find what is wrong with your knee. PROCEDURE Arthroscopy is a surgical technique that allows your orthopedic surgeon to diagnose and treat your knee injury with accuracy. They will look into your knee through a small instrument. This is almost like a small (pencil sized) telescope. Because arthroscopy affects your knee less than open knee surgery, you can anticipate a more rapid recovery. Taking an active role by following your caregiver's instructions will help with rapid and complete recovery. Use crutches, rest, elevation, ice, and knee exercises as instructed. The length of recovery depends on various factors including type of injury, age, physical condition, medical conditions, and your rehabilitation. Your knee is the joint between the large bones (femur and tibia) in your leg. Cartilage covers these bone ends which are smooth and slippery and allow your knee to bend and move smoothly. Two menisci, thick, semi-lunar shaped pads of cartilage which form a rim inside the joint, help absorb  shock and stabilize your knee. Ligaments bind the bones together and support your knee joint. Muscles move the joint, help support your knee, and take stress off the joint itself. Because of this all programs and physical therapy to rehabilitate an injured or repaired knee require rebuilding and strengthening your muscles. AFTER THE PROCEDURE  After the  procedure, you will be moved to a recovery area until most of the effects of the medication have worn off. Your caregiver will discuss the test results with you.   Only take over-the-counter or prescription medicines for pain, discomfort, or fever as directed by your caregiver.  SEEK MEDICAL CARE IF:   You have increased bleeding from your wounds.   You see redness, swelling, or have increasing pain in your wounds.   You have pus coming from your wound.   You have an oral temperature above 102 F (38.9 C).   You notice a bad smell coming from the wound or dressing.   You have severe pain with any motion of your knee.  SEEK IMMEDIATE MEDICAL CARE IF:   You develop a rash.   You have difficulty breathing.   You have any allergic problems.  Document Released: 01/21/2000 Document Revised: 01/12/2011 Document Reviewed: 08/14/2007 [facility] Patient Information 2012 [place].

## 2011-06-21 ENCOUNTER — Encounter (HOSPITAL_COMMUNITY)
Admission: RE | Admit: 2011-06-21 | Discharge: 2011-06-21 | Disposition: A | Discharge status: Home / Self Care | Payer: Managed Care, Other (non HMO) | Source: Ambulatory Visit | Attending: Orthopedic Surgery | Admitting: Orthopedic Surgery

## 2011-06-21 ENCOUNTER — Encounter (HOSPITAL_COMMUNITY): Payer: Self-pay

## 2011-06-21 HISTORY — DX: Gastro-esophageal reflux disease without esophagitis: K21.9

## 2011-06-21 HISTORY — DX: Unspecified osteoarthritis, unspecified site: M19.90

## 2011-06-21 LAB — BASIC METABOLIC PANEL
BUN: 15 mg/dL (ref 6–23)
CO2: 29 mEq/L (ref 19–32)
Chloride: 95 mEq/L — ABNORMAL LOW (ref 96–112)
Creatinine, Ser: 0.71 mg/dL (ref 0.50–1.35)

## 2011-06-23 ENCOUNTER — Telehealth: Payer: Self-pay | Admitting: Orthopedic Surgery

## 2011-06-23 NOTE — Telephone Encounter (Signed)
Riva, ph# [phone], regarding out-patient surgery, 06/30/11 at [facility], CPT codes [phone], [phone].  Reached automated response system, which indicates no pre-authorization required for these procedures for in-network providers.  Conf/Ref# for CPT [id number]:  [id number];  Ref# for CPT 29880: [id number].

## 2011-06-29 NOTE — H&P (Addendum)
  Patient ID: Brent Daniels, male DOB: [birth year], 26 y.o. MRN: [id number]  Referral from Dr Luna Glasgow   chief complaint RIGHT knee pain   The patient was unloading some furniture, the other person, lost some grip of furniture. He fell down some stairs onto a flexed knee in the spring of 2012 since that time. He is having consistent pain described as sharp and throbbing in the RIGHT knee with increased pain standing, walking, working. He cannot ask pain, painful employment secondary to knee pain, swelling in the knee gives out. His pain level is 10 out of 10. He's had 2 MRIs one in March when in November. Both show ACL abnormalities with what looks like a partial ACL tear in the RIGHT knee.   Review of Systems  Constitutional: Negative.  HENT: Negative.  Eyes: Negative.  Respiratory: Negative.  Cardiovascular: Negative.  Gastrointestinal: Negative.  Genitourinary: Negative.  Musculoskeletal: Positive for joint pain.  Skin: Negative.  Neurological: Negative.  Endo/Heme/Allergies: Negative.  Psychiatric/Behavioral: Negative.   Vital signs are stable as recorded   General appearance is normal   The patient is alert and oriented x3   The patient's mood and affect are normal   Gait assessment: The date is normal without assistive device   The cardiovascular exam reveals normal pulses and temperature without edema swelling.   The lymphatic system is negative for palpable lymph nodes   The sensory exam is normal.   There are no pathologic reflexes.   Balance is normal.   Exam of the RIGHT knee shows an intact Lockman pain with the pivot shift maneuver without instability and a positive McMurray sign with medial joint line tenderness. Her range of motion is normal. Collateral ligaments are stable. Muscle tone is excellent. Skin intact.   Exam of the LEFT knee shows an intact Lockman normal range of motion, stability, and strength, with no tenderness or swelling.    An MRI was  obtained and reviewed from March and also of November. They are somewhat inconclusive, although there are bone contusions consistent with ACL injury.   Most likely he had a partial tear. There no meniscal changes noted that his clinical exam is consistent with medial meniscal tear.   I recommend that we do an exam under anesthesia  Then follow that with arthroscopic examination of the knee  Then, a partial medial meniscectomy versus meniscal repair.   The patient is aware of the risks and benefits of the procedure and agrees to proceed with surgery.   Diagnoses #1 torn medial meniscus.  Diagnosis #2 partial ACL tear.   Procedure planned arthroscopy, right  knee exam under anesthesia, and partial medial meniscectomy versus meniscal repair

## 2011-06-30 ENCOUNTER — Encounter (HOSPITAL_COMMUNITY)
Admission: RE | Disposition: A | Discharge status: Home / Self Care | Payer: Self-pay | Source: Ambulatory Visit | Attending: Orthopedic Surgery

## 2011-06-30 ENCOUNTER — Encounter (HOSPITAL_COMMUNITY): Payer: Self-pay | Admitting: Anesthesiology

## 2011-06-30 ENCOUNTER — Encounter (HOSPITAL_COMMUNITY): Payer: Self-pay | Admitting: *Deleted

## 2011-06-30 ENCOUNTER — Ambulatory Visit (HOSPITAL_COMMUNITY)
Admission: RE | Admit: 2011-06-30 | Discharge: 2011-06-30 | Disposition: A | Discharge status: Home / Self Care | Payer: Managed Care, Other (non HMO) | Source: Ambulatory Visit | Attending: Orthopedic Surgery | Admitting: Orthopedic Surgery

## 2011-06-30 ENCOUNTER — Ambulatory Visit (HOSPITAL_COMMUNITY): Payer: Managed Care, Other (non HMO) | Admitting: Anesthesiology

## 2011-06-30 DIAGNOSIS — M2350 Chronic instability of knee, unspecified knee: Secondary | ICD-10-CM

## 2011-06-30 DIAGNOSIS — Z794 Long term (current) use of insulin: Secondary | ICD-10-CM | POA: Insufficient documentation

## 2011-06-30 DIAGNOSIS — Z01812 Encounter for preprocedural laboratory examination: Secondary | ICD-10-CM | POA: Insufficient documentation

## 2011-06-30 DIAGNOSIS — E119 Type 2 diabetes mellitus without complications: Secondary | ICD-10-CM | POA: Insufficient documentation

## 2011-06-30 DIAGNOSIS — M2390 Unspecified internal derangement of unspecified knee: Secondary | ICD-10-CM

## 2011-06-30 DIAGNOSIS — S83519A Sprain of anterior cruciate ligament of unspecified knee, initial encounter: Secondary | ICD-10-CM

## 2011-06-30 DIAGNOSIS — M25569 Pain in unspecified knee: Secondary | ICD-10-CM

## 2011-06-30 HISTORY — PX: KNEE ARTHROSCOPY: SHX127

## 2011-06-30 LAB — GLUCOSE, CAPILLARY: Glucose-Capillary: 366 mg/dL — ABNORMAL HIGH (ref 70–99)

## 2011-06-30 SURGERY — ARTHROSCOPY, KNEE
Anesthesia: General | Site: Knee | Laterality: Right | Wound class: Clean

## 2011-06-30 MED ORDER — MIDAZOLAM HCL 2 MG/2ML IJ SOLN
INTRAMUSCULAR | Status: AC
  Filled 2011-06-30: qty 2

## 2011-06-30 MED ORDER — ONDANSETRON HCL 4 MG/2ML IJ SOLN
4.0000 mg | Freq: Once | INTRAMUSCULAR | Status: AC
  Administered 2011-06-30: 4 mg via INTRAVENOUS

## 2011-06-30 MED ORDER — CHLORHEXIDINE GLUCONATE 4 % EX LIQD: 60.0000 mL | Freq: Once | CUTANEOUS | Status: DC

## 2011-06-30 MED ORDER — SUCCINYLCHOLINE CHLORIDE 20 MG/ML IJ SOLN
INTRAMUSCULAR | Status: AC
  Filled 2011-06-30: qty 1

## 2011-06-30 MED ORDER — OXYCODONE HCL 5 MG PO TABS
5.0000 mg | ORAL_TABLET | ORAL | Status: DC
  Administered 2011-06-30: 5 mg via ORAL

## 2011-06-30 MED ORDER — ONDANSETRON HCL 4 MG/2ML IJ SOLN
4.0000 mg | Freq: Once | INTRAMUSCULAR | Status: AC | PRN
  Administered 2011-06-30: 4 mg via INTRAVENOUS

## 2011-06-30 MED ORDER — FENTANYL CITRATE 0.05 MG/ML IJ SOLN
INTRAMUSCULAR | Status: DC | PRN
  Administered 2011-06-30: 100 ug via INTRAVENOUS
  Administered 2011-06-30 (×2): 50 ug via INTRAVENOUS

## 2011-06-30 MED ORDER — LACTATED RINGERS IV SOLN
INTRAVENOUS | Status: DC | PRN
  Administered 2011-06-30: 1000 mL
  Administered 2011-06-30 (×2): via INTRAVENOUS

## 2011-06-30 MED ORDER — ACETAMINOPHEN 10 MG/ML IV SOLN
1000.0000 mg | Freq: Once | INTRAVENOUS | Status: AC
  Administered 2011-06-30: 1000 mg via INTRAVENOUS

## 2011-06-30 MED ORDER — EPHEDRINE SULFATE 50 MG/ML IJ SOLN
INTRAMUSCULAR | Status: DC | PRN
  Administered 2011-06-30 (×2): 10 mg via INTRAVENOUS

## 2011-06-30 MED ORDER — CEFAZOLIN SODIUM 1-5 GM-% IV SOLN: 1.0000 g | INTRAVENOUS | Status: DC

## 2011-06-30 MED ORDER — FENTANYL CITRATE 0.05 MG/ML IJ SOLN
25.0000 ug | INTRAMUSCULAR | Status: DC | PRN
  Administered 2011-06-30 (×2): 50 ug via INTRAVENOUS

## 2011-06-30 MED ORDER — MIDAZOLAM HCL 2 MG/2ML IJ SOLN
INTRAMUSCULAR | Status: AC
  Administered 2011-06-30: 2 mg via INTRAVENOUS
  Filled 2011-06-30: qty 2

## 2011-06-30 MED ORDER — ONDANSETRON HCL 4 MG/2ML IJ SOLN
INTRAMUSCULAR | Status: AC
  Administered 2011-06-30: 4 mg via INTRAVENOUS
  Filled 2011-06-30: qty 2

## 2011-06-30 MED ORDER — ONDANSETRON HCL 4 MG/2ML IJ SOLN: 4.0000 mg | Freq: Once | INTRAMUSCULAR | Status: DC

## 2011-06-30 MED ORDER — PROPOFOL 10 MG/ML IV EMUL
INTRAVENOUS | Status: AC
  Filled 2011-06-30: qty 20

## 2011-06-30 MED ORDER — MIDAZOLAM HCL 5 MG/5ML IJ SOLN
INTRAMUSCULAR | Status: DC | PRN
  Administered 2011-06-30: 2 mg via INTRAVENOUS

## 2011-06-30 MED ORDER — EPINEPHRINE HCL 1 MG/ML IJ SOLN
INTRAMUSCULAR | Status: AC
  Filled 2011-06-30: qty 5

## 2011-06-30 MED ORDER — OXYCODONE HCL 5 MG PO TABS
ORAL_TABLET | ORAL | Status: AC
  Administered 2011-06-30: 5 mg via ORAL
  Filled 2011-06-30: qty 1

## 2011-06-30 MED ORDER — ACETAMINOPHEN 10 MG/ML IV SOLN
INTRAVENOUS | Status: AC
  Administered 2011-06-30: 1000 mg via INTRAVENOUS
  Filled 2011-06-30: qty 100

## 2011-06-30 MED ORDER — LACTATED RINGERS IV SOLN: INTRAVENOUS | Status: DC

## 2011-06-30 MED ORDER — PROPOFOL 10 MG/ML IV EMUL
INTRAVENOUS | Status: DC | PRN
  Administered 2011-06-30: 200 mg via INTRAVENOUS
  Administered 2011-06-30: 100 mg via INTRAVENOUS

## 2011-06-30 MED ORDER — INSULIN ASPART 100 UNIT/ML ~~LOC~~ SOLN
15.0000 [IU] | Freq: Once | SUBCUTANEOUS | Status: AC
  Administered 2011-06-30: 15 [IU] via SUBCUTANEOUS

## 2011-06-30 MED ORDER — SODIUM CHLORIDE 0.9 % IR SOLN
Status: DC | PRN
  Administered 2011-06-30: 08:00:00

## 2011-06-30 MED ORDER — MIDAZOLAM HCL 2 MG/2ML IJ SOLN
1.0000 mg | INTRAMUSCULAR | Status: DC | PRN
  Administered 2011-06-30 (×2): 2 mg via INTRAVENOUS

## 2011-06-30 MED ORDER — CELECOXIB 100 MG PO CAPS
400.0000 mg | ORAL_CAPSULE | Freq: Once | ORAL | Status: AC
  Administered 2011-06-30: 400 mg via ORAL

## 2011-06-30 MED ORDER — CELECOXIB 100 MG PO CAPS
ORAL_CAPSULE | ORAL | Status: AC
  Administered 2011-06-30: 400 mg via ORAL
  Filled 2011-06-30: qty 4

## 2011-06-30 MED ORDER — GLYCOPYRROLATE 0.2 MG/ML IJ SOLN
0.2000 mg | Freq: Once | INTRAMUSCULAR | Status: AC
  Administered 2011-06-30: 0.2 mg via INTRAVENOUS

## 2011-06-30 MED ORDER — PROMETHAZINE HCL 12.5 MG PO TABS: 12.5000 mg | ORAL_TABLET | Freq: Four times a day (QID) | ORAL | Status: DC | PRN

## 2011-06-30 MED ORDER — BUPIVACAINE-EPINEPHRINE PF 0.5-1:200000 % IJ SOLN
INTRAMUSCULAR | Status: AC
  Filled 2011-06-30: qty 20

## 2011-06-30 MED ORDER — SUCCINYLCHOLINE CHLORIDE 20 MG/ML IJ SOLN
INTRAMUSCULAR | Status: DC | PRN
  Administered 2011-06-30: 100 mg via INTRAVENOUS

## 2011-06-30 MED ORDER — CEFAZOLIN SODIUM 1-5 GM-% IV SOLN
INTRAVENOUS | Status: DC | PRN
  Administered 2011-06-30: 1 g via INTRAVENOUS

## 2011-06-30 MED ORDER — ACETAMINOPHEN 325 MG PO TABS
325.0000 mg | ORAL_TABLET | ORAL | Status: DC | PRN
Start: 2011-06-30 — End: 2011-06-30

## 2011-06-30 MED ORDER — FENTANYL CITRATE 0.05 MG/ML IJ SOLN
INTRAMUSCULAR | Status: AC
  Administered 2011-06-30: 50 ug via INTRAVENOUS
  Filled 2011-06-30: qty 2

## 2011-06-30 MED ORDER — CEFAZOLIN SODIUM 1-5 GM-% IV SOLN
INTRAVENOUS | Status: AC
  Filled 2011-06-30: qty 50

## 2011-06-30 MED ORDER — GLYCOPYRROLATE 0.2 MG/ML IJ SOLN
INTRAMUSCULAR | Status: AC
  Administered 2011-06-30: 0.2 mg via INTRAVENOUS
  Filled 2011-06-30: qty 1

## 2011-06-30 MED ORDER — EPHEDRINE SULFATE 50 MG/ML IJ SOLN
INTRAMUSCULAR | Status: AC
  Filled 2011-06-30: qty 1

## 2011-06-30 MED ORDER — OXYCODONE-ACETAMINOPHEN 5-325 MG PO TABS: 1.0000 | ORAL_TABLET | ORAL | Status: AC | PRN

## 2011-06-30 MED ORDER — LIDOCAINE HCL (PF) 1 % IJ SOLN
INTRAMUSCULAR | Status: AC
  Filled 2011-06-30: qty 5

## 2011-06-30 MED ORDER — BUPIVACAINE-EPINEPHRINE PF 0.5-1:200000 % IJ SOLN
INTRAMUSCULAR | Status: DC | PRN
  Administered 2011-06-30: 60 mL

## 2011-06-30 MED ORDER — FENTANYL CITRATE 0.05 MG/ML IJ SOLN
INTRAMUSCULAR | Status: AC
  Filled 2011-06-30: qty 2

## 2011-06-30 MED ORDER — LIDOCAINE HCL (CARDIAC) 10 MG/ML IV SOLN
INTRAVENOUS | Status: DC | PRN
  Administered 2011-06-30: 50 mg via INTRAVENOUS

## 2011-06-30 SURGICAL SUPPLY — 56 items
ARTHROWAND PARAGON T2 (SURGICAL WAND)
BAG HAMPER (MISCELLANEOUS) ×3 IMPLANT
BANDAGE ELASTIC 6 VELCRO NS (GAUZE/BANDAGES/DRESSINGS) ×3 IMPLANT
BLADE AGGRESSIVE PLUS 4.0 (BLADE) ×3 IMPLANT
BLADE SURG SZ11 CARB STEEL (BLADE) ×3 IMPLANT
CHLORAPREP W/TINT 26ML (MISCELLANEOUS) ×4 IMPLANT
CLOTH BEACON ORANGE TIMEOUT ST (SAFETY) ×3 IMPLANT
COOLER CRYO IC GRAV AND TUBE (ORTHOPEDIC SUPPLIES) ×3 IMPLANT
CUFF CRYO KNEE LG 20X31 COOLER (ORTHOPEDIC SUPPLIES) IMPLANT
CUFF CRYO KNEE18X23 MED (MISCELLANEOUS) ×2 IMPLANT
CUFF TOURNIQUET SINGLE 34IN LL (TOURNIQUET CUFF) ×2 IMPLANT
CUFF TOURNIQUET SINGLE 44IN (TOURNIQUET CUFF) IMPLANT
CUTTER ANGLED DBL BITE 4.5 (BURR) IMPLANT
DECANTER SPIKE VIAL GLASS SM (MISCELLANEOUS) ×6 IMPLANT
FLOOR PAD 36X40 (MISCELLANEOUS)
GAUZE SPONGE 4X4 16PLY XRAY LF (GAUZE/BANDAGES/DRESSINGS) ×3 IMPLANT
GAUZE XEROFORM 5X9 LF (GAUZE/BANDAGES/DRESSINGS) ×3 IMPLANT
GLOVE BIOGEL PI IND STRL 7.0 (GLOVE) ×1 IMPLANT
GLOVE BIOGEL PI INDICATOR 7.0 (GLOVE) ×1
GLOVE ECLIPSE 7.0 STRL STRAW (GLOVE) ×2 IMPLANT
GLOVE EXAM NITRILE MD LF STRL (GLOVE) ×4 IMPLANT
GLOVE SKINSENSE NS SZ8.0 LF (GLOVE) ×1
GLOVE SKINSENSE STRL SZ8.0 LF (GLOVE) ×2 IMPLANT
GLOVE SS N UNI LF 8.5 STRL (GLOVE) ×3 IMPLANT
GOWN STRL REIN XL XLG (GOWN DISPOSABLE) ×5 IMPLANT
HLDR LEG FOAM (MISCELLANEOUS) ×2 IMPLANT
IV NS IRRIG 3000ML ARTHROMATIC (IV SOLUTION) ×8 IMPLANT
KIT BLADEGUARD II DBL (SET/KITS/TRAYS/PACK) ×3 IMPLANT
KIT ROOM TURNOVER AP CYSTO (KITS) ×3 IMPLANT
LEG HOLDER FOAM (MISCELLANEOUS) ×1
MANIFOLD NEPTUNE II (INSTRUMENTS) ×3 IMPLANT
MARKER SKIN DUAL TIP RULER LAB (MISCELLANEOUS) ×3 IMPLANT
NDL HYPO 18GX1.5 BLUNT FILL (NEEDLE) ×1 IMPLANT
NDL HYPO 21X1.5 SAFETY (NEEDLE) ×1 IMPLANT
NDL SPNL 18GX3.5 QUINCKE PK (NEEDLE) ×1 IMPLANT
NEEDLE HYPO 18GX1.5 BLUNT FILL (NEEDLE) ×3 IMPLANT
NEEDLE HYPO 21X1.5 SAFETY (NEEDLE) ×3 IMPLANT
NEEDLE SPNL 18GX3.5 QUINCKE PK (NEEDLE) ×3 IMPLANT
NS IRRIG 1000ML POUR BTL (IV SOLUTION) ×3 IMPLANT
PACK ARTHRO LIMB DRAPE STRL (MISCELLANEOUS) ×3 IMPLANT
PAD ABD 5X9 TENDERSORB (GAUZE/BANDAGES/DRESSINGS) ×3 IMPLANT
PAD ARMBOARD 7.5X6 YLW CONV (MISCELLANEOUS) ×3 IMPLANT
PAD FLOOR 36X40 (MISCELLANEOUS) ×1 IMPLANT
PADDING CAST COTTON 6X4 STRL (CAST SUPPLIES) ×3 IMPLANT
SET ARTHROSCOPY INST (INSTRUMENTS) ×3 IMPLANT
SET ARTHROSCOPY PUMP TUBE (IRRIGATION / IRRIGATOR) ×3 IMPLANT
SET BASIN LINEN APH (SET/KITS/TRAYS/PACK) ×3 IMPLANT
SPONGE GAUZE 4X4 12PLY (GAUZE/BANDAGES/DRESSINGS) ×3 IMPLANT
STRIP CLOSURE SKIN 1/2X4 (GAUZE/BANDAGES/DRESSINGS) ×1 IMPLANT
SUT ETHILON 3 0 FSL (SUTURE) ×2 IMPLANT
SYR 30ML LL (SYRINGE) ×3 IMPLANT
SYRINGE 10CC LL (SYRINGE) ×3 IMPLANT
WAND 50 DEG COVAC W/CORD (SURGICAL WAND) IMPLANT
WAND 90 DEG TURBOVAC W/CORD (SURGICAL WAND) IMPLANT
WAND ARTHRO PARAGON T2 (SURGICAL WAND) IMPLANT
YANKAUER SUCT BULB TIP 10FT TU (MISCELLANEOUS) ×9 IMPLANT

## 2011-06-30 NOTE — Interval H&P Note (Signed)
History and Physical Interval Note:  06/30/2011 7:27 AM  Brent Daniels  has presented today for surgery, with the diagnosis of right knee arthroscopy with medial menisectomy  The various methods of treatment have been discussed with the patient and family. After consideration of risks, benefits and other options for treatment, the patient has consented to  Procedure(s) (LRB): KNEE ARTHROSCOPY WITH MEDIAL MENISECTOMY (Right) or repair as a surgical intervention .  The patients' history has been reviewed, patient examined, no change in status, stable for surgery.  I have reviewed the patients' chart and labs.  Questions were answered to the patient's satisfaction.     Arther Abbott

## 2011-06-30 NOTE — Preoperative (Signed)
Beta Blockers   Reason not to administer Beta Blockers:Not Applicable 

## 2011-06-30 NOTE — Anesthesia Preprocedure Evaluation (Signed)
Anesthesia Evaluation  Patient identified by MRN, date of birth, ID band Patient awake    Reviewed: Allergy & Precautions, H&P , NPO status , Patient's Chart, lab work & pertinent test results  Airway Mallampati: I TM Distance: >3 FB Neck ROM: Full    Dental No notable dental hx.    Pulmonary Current Smoker,    Pulmonary exam normal       Cardiovascular + Valvular Problems/Murmurs (asyptomatic for past 10 years) AS Rhythm:Regular Rate:Normal     Neuro/Psych negative neurological ROS  negative psych ROS   GI/Hepatic GERD-  ,  Endo/Other  Diabetes mellitus-, Type 1, Insulin Dependent  Renal/GU      Musculoskeletal  (+) Arthritis -,   Abdominal Normal abdominal exam  (+)   Peds  Hematology  (+) Blood dyscrasia, anemia ,   Anesthesia Other Findings   Reproductive/Obstetrics                           Anesthesia Physical Anesthesia Plan  ASA: II  Anesthesia Plan: General   Post-op Pain Management:    Induction: Intravenous  Airway Management Planned: LMA  Additional Equipment:   Intra-op Plan:   Post-operative Plan: Extubation in OR  Informed Consent: I have reviewed the patients History and Physical, chart, labs and discussed the procedure including the risks, benefits and alternatives for the proposed anesthesia with the patient or authorized representative who has indicated his/her understanding and acceptance.   Dental advisory given  Plan Discussed with: CRNA  Anesthesia Plan Comments:         Anesthesia Quick Evaluation

## 2011-06-30 NOTE — Anesthesia Procedure Notes (Addendum)
Procedure Name: LMA Insertion Date/Time: 06/30/2011 7:38 AM Performed by: Antony Contras, Ulrick Methot L Pre-anesthesia Checklist: Patient identified, Timeout performed, Emergency Drugs available, Suction available and Patient being monitored Patient Re-evaluated:Patient Re-evaluated prior to inductionOxygen Delivery Method: Circle system utilized Preoxygenation: Pre-oxygenation with 100% oxygen Intubation Type: IV induction Ventilation: Mask ventilation without difficulty LMA: LMA inserted LMA Size: 4.0 Number of attempts: 1 Placement Confirmation: breath sounds checked- equal and bilateral and positive ETCO2 Tube secured with: Tape Dental Injury: Teeth and Oropharynx as per pre-operative assessment    Procedure Name: Intubation Date/Time: 06/30/2011 7:46 AM Performed by: Antony Contras, Kahmari Herard L Pre-anesthesia Checklist: Patient identified, Patient being monitored, Timeout performed, Emergency Drugs available and Suction available Patient Re-evaluated:Patient Re-evaluated prior to inductionOxygen Delivery Method: Circle System Utilized Preoxygenation: Pre-oxygenation with 100% oxygen Intubation Type: IV induction Ventilation: Mask ventilation without difficulty Laryngoscope Size: 3 and Miller Grade View: Grade I Tube type: Oral Tube size: 7.0 mm Number of attempts: 1 Airway Equipment and Method: stylet Placement Confirmation: ETT inserted through vocal cords under direct vision,  positive ETCO2 and breath sounds checked- equal and bilateral Secured at: 21 cm Tube secured with: Tape Dental Injury: Teeth and Oropharynx as per pre-operative assessment

## 2011-06-30 NOTE — Brief Op Note (Signed)
06/30/2011  8:29 AM  PATIENT:  Brent Daniels  26 y.o. male  PRE-OPERATIVE DIAGNOSIS:  Medial meniscal tear vs acl tear  POST-OPERATIVE DIAGNOSIS:  partial acl tear  PROCEDURE:  Procedure(s) (LRB):EUA,  DIAGNOSTIC KNEE ARTHROSCOPY  (Right)  SURGEON:  Surgeon(s) and Role:    * Carole Civil, MD - Primary  PHYSICIAN ASSISTANT:  FINDINGS: The exam under anesthesia revealed full range of motion. Lachman test right to left normal and equal. Negative pivot shift. Negative posterior drawer. The lateral ligaments stable. Intra-articular findings normal medial meniscus and medial joint normal patellofemoral joint normal lateral compartment and lateral cartilage and lateral meniscus. Posterior-lateral bundle intact, fraying but intact anteromedial bundle   ASSISTANTS: none   ANESTHESIA:   general  EBL:  Total I/O In: 600 [I.V.:600] Out: -   BLOOD ADMINISTERED:none  DRAINS: none   LOCAL MEDICATIONS USED:  MARCAINE WITH EPI  , Amount: 60 ml   SPECIMEN:  No Specimen  DISPOSITION OF SPECIMEN:  N/A  COUNTS:  YES  TOURNIQUET:  * No tourniquets in log *  DICTATION: .Dragon Dictation  PLAN OF CARE: Discharge to home after PACU  PATIENT DISPOSITION:  PACU - hemodynamically stable.   Delay start of Pharmacological VTE agent (>24hrs) due to surgical blood loss or risk of bleeding: not applicable

## 2011-06-30 NOTE — Transfer of Care (Signed)
Immediate Anesthesia Transfer of Care Note  Patient: Brent Daniels  Procedure(s) Performed: Procedure(s) (LRB): ARTHROSCOPY KNEE (Right)  Patient Location: PACU  Anesthesia Type: General  Level of Consciousness: awake, alert , oriented and patient cooperative  Airway & Oxygen Therapy: Patient Spontanous Breathing and Patient connected to face mask oxygen  Post-op Assessment: Report given to PACU RN and Post -op Vital signs reviewed and stable  Post vital signs: Reviewed and stable  Complications: No apparent anesthesia complications

## 2011-06-30 NOTE — Anesthesia Postprocedure Evaluation (Signed)
  Anesthesia Post-op Note  Patient: Brent Daniels  Procedure(s) Performed: Procedure(s) (LRB): ARTHROSCOPY KNEE (Right)  Patient Location: PACU  Anesthesia Type: General  Level of Consciousness: awake, alert , oriented and patient cooperative  Airway and Oxygen Therapy: Patient Spontanous Breathing and Patient connected to face mask oxygen  Post-op Pain: moderate  Post-op Assessment: Post-op Vital signs reviewed, Patient's Cardiovascular Status Stable, Respiratory Function Stable, Patent Airway and No signs of Nausea or vomiting  Post-op Vital Signs: Reviewed and stable  Complications: No apparent anesthesia complications

## 2011-06-30 NOTE — Op Note (Signed)
06/30/2011  8:29 AM  PATIENT:  Brent Daniels  26 y.o. male  PRE-OPERATIVE DIAGNOSIS:  Medial meniscal tear vs acl tear  POST-OPERATIVE DIAGNOSIS:  partial acl tear  PROCEDURE:  Procedure(s) (LRB):EUA,  DIAGNOSTIC KNEE ARTHROSCOPY  (Right)  SURGEON:  Surgeon(s) and Role:    * Carole Civil, MD - Primary  PHYSICIAN ASSISTANT:   FINDINGS: The exam under anesthesia revealed full range of motion. Lachman test right to left normal and equal. Negative pivot shift. Negative posterior drawer. The lateral ligaments stable. Intra-articular findings normal medial meniscus and medial joint normal patellofemoral joint normal lateral compartment and lateral cartilage and lateral meniscus. Posterior-lateral bundle intact, fraying but intact anteromedial bundle  Details of procedure: The patient was identified in the preop area the surgical site was confirmed as a right knee and marked. The chart was reviewed. Consent was signed. The patient was given preoperative antibiotics and taken to the operating room for general anesthesia. Once under general anesthesia an exam under anesthesia was performed in the supine position. Exam under anesthesia revealed no abnormal ligamentous findings.  After timeout was completed a lateral portal was established and a diagnostic arthroscopy was performed. A medial portal was established and a diagnostic arthroscopy was repeated with the assistance of a probe. The medial structures were normal including the medial meniscus. The lateral structures were normal including the lateral meniscus. The patellofemoral joint was normal. The anteromedial bundle had some striations of the fibers all fibers were intact and the posterolateral bundle fibers were also intact and were seen inserted into the femur. The tension on the ligament was normal in extension as well as flexion.  The knee was irrigated and the portals were closed we injected 60 cc of Marcaine with epinephrine and  applied a sterile dressing and a Cryo/Cuff which was activated. ASSISTANTS: none   ANESTHESIA:   general  EBL:  Total I/O In: 600 [I.V.:600] Out: -   BLOOD ADMINISTERED:none  DRAINS: none   LOCAL MEDICATIONS USED:  MARCAINE WITH EPI  , Amount: 60 ml   SPECIMEN:  No Specimen  DISPOSITION OF SPECIMEN:  N/A  COUNTS:  YES  TOURNIQUET:  * No tourniquets in log *  DICTATION: .Dragon Dictation  PLAN OF CARE: Discharge to home after PACU  PATIENT DISPOSITION:  PACU - hemodynamically stable.   Delay start of Pharmacological VTE agent (>24hrs) due to surgical blood loss or risk of bleeding: not applicable

## 2011-06-30 NOTE — Discharge Instructions (Signed)
Anterior Cruciate Ligament Tear with Rehab The anterior cruciate ligament (ACL) of the knee is one of the four major ligaments of the knee. The ACL is responsible for preventing the shinbone (tibia) from moving to far forward (anteriorly) in relation to the thigh bone (femur). An ACL tear (sprain) is a common injury for athletes. The ACL is most important for sports in which pivoting, changing cutting (direction), or jumping and landing is necessary. In general, ligaments do not heal well, and these injuries often require surgery. Approximately 50% of people who tear their ACL also tear the cartilage of their meniscus at the same time. SYMPTOMS   "Pop" or tear heard or felt at the time of injury.   An inability to continue playing after the injury.   Swelling of the knee within 48 hours.   Inability to straighten knee.   Knee giving way or buckling, particularly when trying to pivot, cut (rapidly change direction) or jump.   Occasionally, locking when there is concurrent injury to the meniscus cartilage.  CAUSES  An ACL tear occurs when the ligament is subjected to a greater force than it can withstand. ACL tears commonly occur from contact (being tackled at the knee) or non-contact (ie. landing awkwardly) events. RISK INCREASES WITH:  Sports that require pivoting, jumping, cutting, or changing direction (ie. basketball, soccer, or volleyball) or contact sports. (football, rugby or lacrosse).   Poor strength and flexibility.   Women tend to be at a higher risk than men.   Improperly fitted or padded equipment.  PREVENTION   Warm up and stretch properly before activity.   Maintain physical fitness:   Thigh, leg, and knee flexibility.   Muscle strength and endurance.   Cardiovascular fitness.   Learn and use proper technique.   Wear properly fitted equipment (appropriate length of cleats for surface).  PROGNOSIS  ACL tears do not heal on their own. However, most people will  be able to perform activities of daily living after completing a rehabilitation program. For individuals who desire to return to activities that require pivoting, cutting, or jumping and landing, surgery is usually required. RELATED COMPLICATIONS   Frequent recurrence of symptoms, such as knee giving way, instability, and swelling.   Meniscus injury, which may cause locking and swelling of the knee.   Injury to other structures of the knee.   Arthritis of the knee.   Knee stiffness (loss of knee motion).  TREATMENT Treatment initially involves ice and pain medication to help reduce pain and inflammation. It is often recommended that one walk with crutches until your knee will allow walking without a limp. Rehabilitation programs that involve strengthening and stretching exercises to regain strength and a full range of motion are necessary to regain proper functioning of the knee. These exercises may be completed at home or with a therapist. Your caregiver may give you a knee brace to help support the instable joint. Rehabilitation programs also will educate you on how to avoid further injury to the joint.  MEDICATION  If pain medication is necessary, then nonsteroidal anti-inflammatory medications, such as aspirin and ibuprofen, or other minor pain relievers, such as acetaminophen, are often recommended.   Do not take pain medication within 7 days before surgery.   Prescription pain relievers may be given if deemed necessary by your caregiver. Use only as directed and only as much as you need.  HEAT AND COLD  Cold treatment (icing) relieves pain and reduces inflammation. Cold treatment should be applied for  10 to 15 minutes every 2 to 3 hours for inflammation and pain and immediately after any activity that aggravates your symptoms. Use ice packs or an ice massage.   Heat treatment may be used prior to performing the stretching and strengthening activities prescribed by your caregiver,  physical therapist or athletic trainer. Use a heat pack or a warm soak.  SEEK MEDICAL CARE IF:   Symptoms get worse or do not improve in 6 weeks despite treatment.   New, unexplained symptoms develop. (Drugs used in treatment may produce side effects).

## 2011-06-30 NOTE — Progress Notes (Signed)
Awake. Talking. Demanding. Asking multiple question. Wants to put clothes on. Wants to go home. Reassurance given. Ride called.

## 2011-06-30 NOTE — Progress Notes (Signed)
Awake. Talking. Wants pain med. Reassurance given. Voiced understanding.

## 2011-06-30 NOTE — Progress Notes (Signed)
FS[facility] 358. Dr Mirna Mires notified. Order given for novolog 15 units. Novolog 15 units SQ lt lower abd per pt. Pt wanted to give own injection of insulin.

## 2011-07-04 ENCOUNTER — Encounter: Payer: Self-pay | Admitting: Orthopedic Surgery

## 2011-07-04 ENCOUNTER — Ambulatory Visit (INDEPENDENT_AMBULATORY_CARE_PROVIDER_SITE_OTHER): Payer: Managed Care, Other (non HMO) | Admitting: Orthopedic Surgery

## 2011-07-04 VITALS — BP 110/70 | Ht 74.0 in | Wt 190.0 lb

## 2011-07-04 DIAGNOSIS — Z9889 Other specified postprocedural states: Secondary | ICD-10-CM

## 2011-07-04 HISTORY — DX: Other specified postprocedural states: Z98.890

## 2011-07-04 MED ORDER — HYDROCODONE-ACETAMINOPHEN 7.5-325 MG PO TABS: 1.0000 | ORAL_TABLET | ORAL | Status: AC | PRN

## 2011-07-04 MED ORDER — IBUPROFEN 800 MG PO TABS: 800.0000 mg | ORAL_TABLET | Freq: Three times a day (TID) | ORAL | Status: AC | PRN

## 2011-07-04 NOTE — Progress Notes (Signed)
Patient ID: Brent Daniels, male   DOB: 1985-11-25, 26 y.o.   MRN: [id number] Chief Complaint  Patient presents with  . Routine Post Op    post op 1 right knee, DOS 06/30/11   BP 110/70  Ht 6\' 2"  (1.88 m)  Wt 190 lb (86.183 kg)  BMI 24.39 kg/m2  Status post arthroscopy and examination under anesthesia right knee  Operative findings intact anterior cruciate ligament and normal exam under anesthesia  Meniscus not torn  Complains of swelling stiffness of the right knee. He came in nonweightbearing with crutches.  Stitches are removed. Joint effusion noted.  Recommend weightbearing as tolerated start physical therapy. He is to wear a hinged economy brace wrap style.  No anterior cruciate ligament surgery necessary exam under anesthesia showed no ligamentous abnormalities in the cruciate ligament was intact. Meniscus was intact.

## 2011-07-04 NOTE — Patient Instructions (Addendum)
Start physical therapy at [facility]   Start weight bearing with crutches

## 2011-07-05 ENCOUNTER — Ambulatory Visit (HOSPITAL_COMMUNITY): Payer: Managed Care, Other (non HMO) | Admitting: Physical Therapy

## 2011-07-07 ENCOUNTER — Inpatient Hospital Stay (HOSPITAL_COMMUNITY)
Admission: RE | Admit: 2011-07-07 | Payer: Managed Care, Other (non HMO) | Source: Ambulatory Visit | Admitting: Physical Therapy

## 2011-07-12 ENCOUNTER — Ambulatory Visit (HOSPITAL_COMMUNITY): Payer: Managed Care, Other (non HMO) | Admitting: Physical Therapy

## 2011-07-24 ENCOUNTER — Other Ambulatory Visit: Payer: Self-pay | Admitting: *Deleted

## 2011-07-24 MED ORDER — HYDROCODONE-ACETAMINOPHEN 5-325 MG PO TABS: 1.0000 | ORAL_TABLET | ORAL | Status: AC | PRN

## 2011-07-26 ENCOUNTER — Inpatient Hospital Stay (HOSPITAL_COMMUNITY)
Admission: RE | Admit: 2011-07-26 | Payer: Managed Care, Other (non HMO) | Source: Ambulatory Visit | Admitting: Physical Therapy

## 2011-08-02 ENCOUNTER — Ambulatory Visit (HOSPITAL_COMMUNITY)
Admission: RE | Admit: 2011-08-02 | Discharge: 2011-08-02 | Disposition: A | Discharge status: Home / Self Care | Payer: Managed Care, Other (non HMO) | Source: Ambulatory Visit | Attending: Orthopedic Surgery | Admitting: Orthopedic Surgery

## 2011-08-02 DIAGNOSIS — R262 Difficulty in walking, not elsewhere classified: Secondary | ICD-10-CM | POA: Insufficient documentation

## 2011-08-02 DIAGNOSIS — R29898 Other symptoms and signs involving the musculoskeletal system: Secondary | ICD-10-CM | POA: Insufficient documentation

## 2011-08-02 DIAGNOSIS — M25561 Pain in right knee: Secondary | ICD-10-CM | POA: Insufficient documentation

## 2011-08-02 DIAGNOSIS — M25669 Stiffness of unspecified knee, not elsewhere classified: Secondary | ICD-10-CM | POA: Insufficient documentation

## 2011-08-02 DIAGNOSIS — M25569 Pain in unspecified knee: Secondary | ICD-10-CM | POA: Insufficient documentation

## 2011-08-02 DIAGNOSIS — M6281 Muscle weakness (generalized): Secondary | ICD-10-CM | POA: Insufficient documentation

## 2011-08-02 DIAGNOSIS — IMO0001 Reserved for inherently not codable concepts without codable children: Secondary | ICD-10-CM | POA: Insufficient documentation

## 2011-08-02 NOTE — Evaluation (Signed)
Physical Therapy Evaluation  Patient Details  Name: Brent Daniels MRN: [id number] Date of Birth: Oct 16, 1985  Today's Date: 08/02/2011 Time: [id number] PT Time Calculation (min): 42 min  Visit#: 1  of 12   Re-eval: 09/01/11 Assessment Diagnosis: Partial ACL repair Surgical Date: 08/10/11 Next MD Visit: 08/13/2011   Past Medical History:  Past Medical History  Diagnosis Date  . Heart murmur   . Diabetes mellitus     type 1  . Arthritis   . Back pain   . GERD (gastroesophageal reflux disease)    Past Surgical History:  Past Surgical History  Procedure Date  . Tympanostomy tube placement   . Knee arthroscopy 06/30/2011    Procedure: ARTHROSCOPY KNEE;  Surgeon: Carole Civil, MD;  Location: [facility];  Service: Orthopedics;  Laterality: Right;  diagnostic arthroscopy    Subjective Symptoms/Limitations Symptoms: Brent Daniels states that he injured his right knee about a year ago while lifting furniture.  The patient continuted to have pain therefore he went to his orthopedic MD.  He had arthroscopic surgery on 06/30/11 and is being referred to PT due to continuted pain and weakness How long can you sit comfortably?: 30 minutes and then his knee becomes stiff but he can continue to sit. How long can you stand comfortably?: He can stand for 15 minutes before he has increased pain How long can you walk comfortably?: He can walk for ten minutes before he has increased pain but he will continue to push himself. Pain Assessment Currently in Pain?: Yes Pain Score:   6 Pain Location: Knee Pain Orientation: Right;Medial Pain Type: Surgical pain Pain Onset: 1 to 4 weeks ago Pain Frequency: Constant Pain Relieving Factors: ice Effect of Pain on Daily Activities: pt states pain stays about the same.   Cognition/Observation Cognition Overall Cognitive Status: Appears within functional limits for tasks assessed  Assessment RLE Strength Right Hip Flexion: 3/5 Right Hip Extension:  3+/5 Right Hip ABduction: 3+/5 Right Hip ADduction: 3/5 Right Knee Flexion: 3+/5 Right Knee Extension: 3/5 Right Ankle Dorsiflexion: 3+/5 Right Ankle Plantar Flexion: 3/5  Exercise/Treatments    Seated Long Arc Quad: 10 reps Other Seated Knee Exercises: ankle DF w/ blue t-band Supine Quad Sets: 10 reps Straight Leg Raises: 10 reps Sidelying Hip ABduction: 10 reps;Limitations Hip ABduction Limitations: 3# Hip ADduction: 10 reps Prone  Hamstring Curl: 10 reps;Limitations Hamstring Curl Limitations: 3# Hip Extension: 10 reps;Limitations Hip Extension Limitations: 3#   Modalities Modalities: Cryotherapy Cryotherapy Number Minutes Cryotherapy: 12 Minutes Cryotherapy Location: Knee Type of Cryotherapy: Ice pack  Physical Therapy Assessment and Plan PT Assessment and Plan Clinical Impression Statement: Pt s/p arthroscopic surgery for a partial ACL tear who will benefit from skilled PT to improve strength and proprioception to retrun pt to prior functional level. Pt will benefit from skilled therapeutic intervention in order to improve on the following deficits: Decreased strength;Difficulty walking;Pain Rehab Potential: Good PT Frequency: Min 2X/week PT Duration: 6 weeks PT Treatment/Interventions: Therapeutic activities;Therapeutic exercise PT Plan: begin elliptical, heel raise, squats, lateral step ups, standing terminal ext, standing knee flex w/wt, standing Baps, SLS next treatment   Third treatment add cybex leg pres; quad and ham machines.  Fourth begin wall sits and forward step ups, lunges both forward and side ...    Goals Home Exercise Program Pt will Perform Home Exercise Program: Independently PT Short Term Goals Time to Complete Short Term Goals: 2 weeks PT Short Term Goal 1: Pain to be no greater than a 4  PT Short Term Goal 2: strength to be increased 1/2 grade PT Short Term Goal 3: Pt to be able to walk for 20 min without increased pain PT Long Term  Goals Time to Complete Long Term Goals:  (6 weeks) PT Long Term Goal 1: I in advance HEP PT Long Term Goal 2: Pain less than a 2 80% of the day Long Term Goal 3: mm strength improved one grade  Long Term Goal 4: pt able to walk for an hour without any increased discomfort PT Long Term Goal 5: Pt able to ascend and desend steps without discomfort.  Problem List Patient Active Problem List  Diagnosis  . S/P arthroscopic knee surgery  . Difficulty in walking  . Pain in right knee  . Weakness of right leg    PT - End of Session Activity Tolerance: Patient tolerated treatment well General Behavior During Session: [facility] for tasks performed Cognition: [facility] for tasks performed PT Plan of Care PT Home Exercise Plan: Given Consulted and Agree with Plan of Care: Patient  GP    Lynanne Delgreco,CINDY 08/02/2011, 2:12 PM  Physician Documentation Your signature is required to indicate approval of the treatment plan as stated above.  Please sign and either send electronically or make a copy of this report for your files and return this physician signed original.   Please mark one 1.__approve of plan  2. ___approve of plan with the following conditions.   ______________________________                                                          _____________________ Physician Signature                                                                                                             Date

## 2011-08-14 ENCOUNTER — Encounter (HOSPITAL_COMMUNITY): Payer: Self-pay | Admitting: *Deleted

## 2011-08-14 ENCOUNTER — Emergency Department (HOSPITAL_COMMUNITY)
Admission: EM | Admit: 2011-08-14 | Discharge: 2011-08-14 | Disposition: A | Discharge status: Home / Self Care | Payer: Managed Care, Other (non HMO) | Attending: Emergency Medicine | Admitting: Emergency Medicine

## 2011-08-14 ENCOUNTER — Emergency Department (HOSPITAL_COMMUNITY): Payer: Managed Care, Other (non HMO)

## 2011-08-14 DIAGNOSIS — K219 Gastro-esophageal reflux disease without esophagitis: Secondary | ICD-10-CM | POA: Insufficient documentation

## 2011-08-14 DIAGNOSIS — L02419 Cutaneous abscess of limb, unspecified: Secondary | ICD-10-CM | POA: Insufficient documentation

## 2011-08-14 DIAGNOSIS — L03116 Cellulitis of left lower limb: Secondary | ICD-10-CM

## 2011-08-14 DIAGNOSIS — S20219A Contusion of unspecified front wall of thorax, initial encounter: Secondary | ICD-10-CM

## 2011-08-14 DIAGNOSIS — Z8739 Personal history of other diseases of the musculoskeletal system and connective tissue: Secondary | ICD-10-CM | POA: Insufficient documentation

## 2011-08-14 DIAGNOSIS — L03119 Cellulitis of unspecified part of limb: Secondary | ICD-10-CM | POA: Insufficient documentation

## 2011-08-14 DIAGNOSIS — E109 Type 1 diabetes mellitus without complications: Secondary | ICD-10-CM | POA: Insufficient documentation

## 2011-08-14 DIAGNOSIS — F17200 Nicotine dependence, unspecified, uncomplicated: Secondary | ICD-10-CM | POA: Insufficient documentation

## 2011-08-14 MED ORDER — CEPHALEXIN 500 MG PO CAPS: 500.0000 mg | ORAL_CAPSULE | Freq: Four times a day (QID) | ORAL | Status: AC

## 2011-08-14 MED ORDER — OXYCODONE-ACETAMINOPHEN 5-325 MG PO TABS: 1.0000 | ORAL_TABLET | ORAL | Status: AC | PRN

## 2011-08-14 MED ORDER — SULFAMETHOXAZOLE-TMP DS 800-160 MG PO TABS
1.0000 | ORAL_TABLET | Freq: Once | ORAL | Status: AC
  Administered 2011-08-14: 1 via ORAL
  Filled 2011-08-14: qty 1

## 2011-08-14 MED ORDER — KETOROLAC TROMETHAMINE 60 MG/2ML IM SOLN
60.0000 mg | Freq: Once | INTRAMUSCULAR | Status: AC
  Administered 2011-08-14: 60 mg via INTRAMUSCULAR
  Filled 2011-08-14: qty 2

## 2011-08-14 MED ORDER — SULFAMETHOXAZOLE-TRIMETHOPRIM 800-160 MG PO TABS: 1.0000 | ORAL_TABLET | Freq: Two times a day (BID) | ORAL | Status: DC

## 2011-08-14 MED ORDER — NAPROXEN 500 MG PO TABS: 500.0000 mg | ORAL_TABLET | Freq: Two times a day (BID) | ORAL | Status: DC
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
## 2011-08-15 ENCOUNTER — Ambulatory Visit (HOSPITAL_COMMUNITY): Payer: Managed Care, Other (non HMO) | Admitting: *Deleted

## 2011-08-15 ENCOUNTER — Ambulatory Visit: Payer: Managed Care, Other (non HMO) | Admitting: Orthopedic Surgery

## 2011-08-17 ENCOUNTER — Ambulatory Visit (HOSPITAL_COMMUNITY): Payer: Managed Care, Other (non HMO) | Attending: Orthopedic Surgery | Admitting: *Deleted

## 2011-08-17 DIAGNOSIS — M25669 Stiffness of unspecified knee, not elsewhere classified: Secondary | ICD-10-CM | POA: Insufficient documentation

## 2011-08-17 DIAGNOSIS — M25569 Pain in unspecified knee: Secondary | ICD-10-CM | POA: Insufficient documentation

## 2011-08-17 DIAGNOSIS — M6281 Muscle weakness (generalized): Secondary | ICD-10-CM | POA: Insufficient documentation

## 2011-08-17 DIAGNOSIS — IMO0001 Reserved for inherently not codable concepts without codable children: Secondary | ICD-10-CM | POA: Insufficient documentation

## 2011-08-22 ENCOUNTER — Ambulatory Visit (HOSPITAL_COMMUNITY): Payer: Managed Care, Other (non HMO) | Admitting: *Deleted

## 2011-08-24 ENCOUNTER — Ambulatory Visit (HOSPITAL_COMMUNITY): Payer: Managed Care, Other (non HMO) | Admitting: Physical Therapy

## 2011-08-29 ENCOUNTER — Inpatient Hospital Stay (HOSPITAL_COMMUNITY)
Admission: RE | Admit: 2011-08-29 | Discharge: 2011-08-29 | Payer: Managed Care, Other (non HMO) | Source: Ambulatory Visit | Attending: Physical Therapy | Admitting: Physical Therapy

## 2011-08-29 NOTE — Progress Notes (Signed)
  Patient Details  Name: Brent Daniels MRN: [id number] Date of Birth: October 13, 1985  Today's Date: 08/29/2011  Pt. Has not returned to therapy since initial evaluation on 08/02/11.  Pt. Will be discharged from therapy at this time.  Notification sent to Dr. Aline Brochure.  Teena Irani, PTA/CLT 08/29/2011, 2:04 PM

## 2011-08-31 ENCOUNTER — Ambulatory Visit (HOSPITAL_COMMUNITY): Payer: Managed Care, Other (non HMO) | Admitting: Physical Therapy

## 2011-09-20 ENCOUNTER — Ambulatory Visit (INDEPENDENT_AMBULATORY_CARE_PROVIDER_SITE_OTHER): Payer: Managed Care, Other (non HMO) | Admitting: Orthopedic Surgery

## 2011-09-20 ENCOUNTER — Encounter: Payer: Self-pay | Admitting: Orthopedic Surgery

## 2011-09-20 VITALS — BP 138/80 | Ht 74.0 in | Wt 163.0 lb

## 2011-09-20 DIAGNOSIS — M25569 Pain in unspecified knee: Secondary | ICD-10-CM

## 2011-09-20 MED ORDER — HYDROCODONE-ACETAMINOPHEN 5-325 MG PO TABS: 1.0000 | ORAL_TABLET | Freq: Four times a day (QID) | ORAL | Status: AC | PRN

## 2011-09-20 NOTE — Patient Instructions (Addendum)
Refer to [facility] for 2nd opinion

## 2011-09-20 NOTE — Progress Notes (Signed)
Patient ID: Brent Daniels, male   DOB: 26-Jan-1986, 26 y.o.   MRN: [id number] Chief Complaint  Patient presents with  . Follow-up    recheck Right knee, DOS 06/30/11    BP 138/80  Ht 6\' 2"  (1.88 m)  Wt 163 lb (73.936 kg)  BMI 20.93 kg/m2  Status post arthroscopy right knee exam under anesthesia we did not find evidence of anterior cruciate ligament symptomatic tear. Cruciate ligament was stable under anesthesia as well as visually arthroscopically. The patient still complains of medial knee pain and posterior knee pain  I told him I don't have a reason for his pain. He asked for second opinion.  I refilled his medication given a couple of refills told him he could not get any more pain medication he says he cannot take ibuprofen. Tylenol not relieving his pain.  He went to therapy visits and he says that there was discrepancy with the appointments regarding which day he was supposed to go so he did the therapy on his own and he was discharged from therapy.  He will be referred to another provider per her notes and operative reports will be sent.  PS: PRE-OPERATIVE DIAGNOSIS: Medial meniscal tear vs acl tear  POST-OPERATIVE DIAGNOSIS: partial acl tear  PROCEDURE: Procedure(s) (LRB):EUA,  DIAGNOSTIC KNEE ARTHROSCOPY (Right)  SURGEON: Surgeon(s) and Role:  * Carole Civil, MD - Primary  PHYSICIAN ASSISTANT:  FINDINGS: The exam under anesthesia revealed full range of motion. Lachman test right to left normal and equal. Negative pivot shift. Negative posterior drawer. The lateral ligaments stable.  Intra-articular findings normal medial meniscus and medial joint normal patellofemoral joint normal lateral compartment and lateral cartilage and lateral meniscus. Posterior-lateral bundle intact, fraying but intact anteromedial bundle  Details of procedure: The patient was identified in the preop area the surgical site was confirmed as a right knee and marked. The chart was reviewed. Consent  was signed. The patient was given preoperative antibiotics and taken to the operating room for general anesthesia. Once under general anesthesia an exam under anesthesia was performed in the supine position. Exam under anesthesia revealed no abnormal ligamentous findings.

## 2011-09-26 ENCOUNTER — Other Ambulatory Visit: Payer: Self-pay | Admitting: *Deleted

## 2011-09-26 ENCOUNTER — Telehealth: Payer: Self-pay | Admitting: *Deleted

## 2011-09-26 DIAGNOSIS — M25561 Pain in right knee: Secondary | ICD-10-CM

## 2011-09-26 NOTE — Telephone Encounter (Signed)
Referral sent to [facility] for second opinion, right knee pain

## 2011-10-07 ENCOUNTER — Emergency Department (HOSPITAL_COMMUNITY)
Admission: EM | Admit: 2011-10-07 | Discharge: 2011-10-07 | Disposition: A | Discharge status: Home / Self Care | Payer: Managed Care, Other (non HMO) | Attending: Emergency Medicine | Admitting: Emergency Medicine

## 2011-10-07 ENCOUNTER — Encounter (HOSPITAL_COMMUNITY): Payer: Self-pay | Admitting: *Deleted

## 2011-10-07 DIAGNOSIS — R011 Cardiac murmur, unspecified: Secondary | ICD-10-CM | POA: Insufficient documentation

## 2011-10-07 DIAGNOSIS — R229 Localized swelling, mass and lump, unspecified: Secondary | ICD-10-CM | POA: Insufficient documentation

## 2011-10-07 DIAGNOSIS — M79609 Pain in unspecified limb: Secondary | ICD-10-CM | POA: Insufficient documentation

## 2011-10-07 DIAGNOSIS — X12XXXA Contact with other hot fluids, initial encounter: Secondary | ICD-10-CM | POA: Insufficient documentation

## 2011-10-07 DIAGNOSIS — Z794 Long term (current) use of insulin: Secondary | ICD-10-CM | POA: Insufficient documentation

## 2011-10-07 DIAGNOSIS — E109 Type 1 diabetes mellitus without complications: Secondary | ICD-10-CM | POA: Insufficient documentation

## 2011-10-07 DIAGNOSIS — K219 Gastro-esophageal reflux disease without esophagitis: Secondary | ICD-10-CM | POA: Insufficient documentation

## 2011-10-07 DIAGNOSIS — X131XXA Other contact with steam and other hot vapors, initial encounter: Secondary | ICD-10-CM | POA: Insufficient documentation

## 2011-10-07 DIAGNOSIS — T23029A Burn of unspecified degree of unspecified single finger (nail) except thumb, initial encounter: Secondary | ICD-10-CM | POA: Insufficient documentation

## 2011-10-07 DIAGNOSIS — F17200 Nicotine dependence, unspecified, uncomplicated: Secondary | ICD-10-CM | POA: Insufficient documentation

## 2011-10-07 DIAGNOSIS — T300 Burn of unspecified body region, unspecified degree: Secondary | ICD-10-CM

## 2011-10-07 LAB — BASIC METABOLIC PANEL
CO2: 28 mEq/L (ref 19–32)
Calcium: 9.6 mg/dL (ref 8.4–10.5)
GFR calc non Af Amer: >90 mL/min (ref >90)
Sodium: 134 mEq/L — ABNORMAL LOW (ref 135–145)

## 2011-10-07 LAB — CBC WITH DIFFERENTIAL/PLATELET
Basophils Absolute: 0.1 10*3/uL (ref 0.0–0.1)
Eosinophils Relative: 6 % — ABNORMAL HIGH (ref 0–5)
Lymphocytes Relative: 24 % (ref 12–46)
MCV: 91.2 fL (ref 78.0–100.0)
Platelets: 355 10*3/uL (ref 150–400)
RDW: 13.5 % (ref 11.5–15.5)
WBC: 11 10*3/uL — ABNORMAL HIGH (ref 4.0–10.5)

## 2011-10-07 LAB — GLUCOSE, CAPILLARY: Glucose-Capillary: 421 mg/dL — ABNORMAL HIGH (ref 70–99)

## 2011-10-07 MED ORDER — DOXYCYCLINE HYCLATE 100 MG PO CAPS: 100.0000 mg | ORAL_CAPSULE | Freq: Two times a day (BID) | ORAL | Status: AC

## 2011-10-07 MED ORDER — VANCOMYCIN HCL IN DEXTROSE 1-5 GM/200ML-% IV SOLN
1000.0000 mg | Freq: Once | INTRAVENOUS | Status: AC
  Administered 2011-10-07: 1000 mg via INTRAVENOUS
  Filled 2011-10-07: qty 200

## 2011-10-07 MED ORDER — HYDROCODONE-ACETAMINOPHEN 5-325 MG PO TABS: 1.0000 | ORAL_TABLET | Freq: Four times a day (QID) | ORAL | Status: AC | PRN

## 2011-10-07 MED ORDER — ONDANSETRON HCL 4 MG/2ML IJ SOLN
4.0000 mg | Freq: Once | INTRAMUSCULAR | Status: AC
  Administered 2011-10-07: 4 mg via INTRAVENOUS
  Filled 2011-10-07: qty 2

## 2011-10-07 MED ORDER — HYDROMORPHONE HCL PF 1 MG/ML IJ SOLN
1.0000 mg | Freq: Once | INTRAMUSCULAR | Status: AC
Start: 2011-10-07 — End: 2011-10-07
  Administered 2011-10-07: 1 mg via INTRAVENOUS
  Filled 2011-10-07: qty 1
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
## 2011-10-07 NOTE — ED Provider Notes (Signed)
History   This chart was scribed for Maudry Diego, MD scribed by Mitchell Heir. The patient was seen in room APA02/APA02 at 16:22   CSN: [id number]  Arrival date & time 10/07/11  1503     Chief Complaint  Patient presents with  . Hand Burn    (Consider location/radiation/quality/duration/timing/severity/associated sxs/prior treatment) HPI Comments: Brent Daniels is a 26 y.o. male who presents to the Emergency Department complaining of constant moderate right hand pain with associated swelling as a result of a burn. Pt explains that he was changing his oil 4 days ago when he burned right hand. He explains that shortly after a blister appeared on his right middle finger, which later scarred and fell off. Pt says two days after the blister fell off he began experiencing moderate right hand swelling. Pt states he has tried ice application, kept his hand elevated, and applied Bactine with no relief. Tetanus UTD PCP: Dr. Wenda Overland  Patient is a 26 y.o. male presenting with hand pain. The history is provided by the patient. No language interpreter was used.  Hand Pain The current episode started more than 2 days ago. The problem occurs constantly. The problem has not changed since onset.Pertinent negatives include no chest pain, no abdominal pain and no headaches. Nothing aggravates the symptoms. Nothing relieves the symptoms. He has tried a cold compress for the symptoms. The treatment provided no relief.    Past Medical History  Diagnosis Date  . Heart murmur   . Diabetes mellitus     type 1  . Arthritis   . Back pain   . GERD (gastroesophageal reflux disease)     Past Surgical History  Procedure Date  . Tympanostomy tube placement   . Knee arthroscopy 06/30/2011    Procedure: ARTHROSCOPY KNEE;  Surgeon: Carole Civil, MD;  Location: [facility];  Service: Orthopedics;  Laterality: Right;  diagnostic arthroscopy    Family History  Problem Relation Age of Onset  . Cancer Father     . Pseudochol deficiency Neg Hx   . Malignant hyperthermia Neg Hx   . Hypotension Neg Hx   . Anesthesia problems Neg Hx     History  Substance Use Topics  . Smoking status: Current Everyday Smoker -- 1.0 packs/day for 10 years    Types: Cigarettes  . Smokeless tobacco: Former Systems developer    Quit date: 09/17/2000  . Alcohol Use: No      Review of Systems  Constitutional: Negative for fatigue.  HENT: Negative for congestion, sinus pressure and ear discharge.   Eyes: Negative for discharge.  Respiratory: Negative for cough.   Cardiovascular: Negative for chest pain.  Gastrointestinal: Negative for abdominal pain and diarrhea.  Genitourinary: Negative for frequency and hematuria.  Musculoskeletal: Negative for back pain.  Skin: Positive for wound. Negative for rash.  Neurological: Negative for seizures and headaches.  Hematological: Negative.   Psychiatric/Behavioral: Negative for hallucinations.  All other systems reviewed and are negative.    Allergies  Ibuprofen; Naproxen; and Sulfa antibiotics  Home Medications   Current Outpatient Rx  Name Route Sig Dispense Refill  . INSULIN REGULAR HUMAN 100 UNIT/ML IJ SOLN Subcutaneous Inject into the skin as directed. Sliding Scale. Inject required units based on Blood Glucose levels up to 5 times daily with meals.      BP 150/82  Pulse 98  Temp 98.2 F (36.8 C) (Oral)  Resp 18  Ht 6\' 2"  (1.88 m)  Wt 185 lb (83.915 kg)  BMI  23.75 kg/m2  SpO2 97%  Physical Exam  Nursing note and vitals reviewed. Constitutional: He is oriented to person, place, and time. He appears well-developed and well-nourished. No distress.  HENT:  Head: Normocephalic.  Eyes: Conjunctivae and EOM are normal.  Neck: Normal range of motion. No tracheal deviation present.  Cardiovascular: Normal rate.   No murmur heard. Pulmonary/Chest: Effort normal.  Musculoskeletal: Normal range of motion. He exhibits edema.       Right hand healing burn on the  middle finger around the PIT joint . A couple abrasions to the back of the right hand and swelling.   Neurological: He is alert and oriented to person, place, and time.  Skin: Skin is warm and dry. He is not diaphoretic.  Psychiatric: He has a normal mood and affect. His behavior is normal.    ED Course  Procedures (including critical care time) DIAGNOSTIC STUDIES: Oxygen Saturation is 97% on room air, normal by my interpretation.    COORDINATION OF CARE: 16:23: Physical exam started. 16:24: Physical exam completed.  18:38: Provided intent to place on abx and pt notified labs were nml except for high sugars. Recommended recheck on Tuesday. Notified if condition worsens for pt to follow-up at [facility] with hand specialist.  MEDICATIONS ORDERED 1630:  VANCOCIN IVPB 1000 mg/200 mL premix Once DILAUDID injection 1 mg Once  ZOFRAN injection 4 mg  Labs Reviewed  GLUCOSE, CAPILLARY - Abnormal; Notable for the following:    Glucose-Capillary 421 (*)     All other components within normal limits  CBC WITH DIFFERENTIAL - Abnormal; Notable for the following:    WBC 11.0 (*)     RBC 4.11 (*)     Hemoglobin 12.6 (*)     HCT 37.5 (*)     Eosinophils Relative 6 (*)     All other components within normal limits  BASIC METABOLIC PANEL - Abnormal; Notable for the following:    Sodium 134 (*)     Glucose, Bld 384 (*)     Creatinine, Ser 0.49 (*)     All other components within normal limits   No results found.   No diagnosis found.   Burn to right hand with swelling.  Possible infection.   MDM  The chart was scribed for me under my direct supervision.  I personally performed the history, physical, and medical decision making and all procedures in the evaluation of this patient.Maudry Diego, MD 10/07/11 [phone]

## 2012-01-04 ENCOUNTER — Emergency Department (HOSPITAL_COMMUNITY)
Admission: EM | Admit: 2012-01-04 | Discharge: 2012-01-04 | Disposition: A | Discharge status: Home / Self Care | Payer: Managed Care, Other (non HMO) | Attending: Emergency Medicine | Admitting: Emergency Medicine

## 2012-01-04 ENCOUNTER — Emergency Department (HOSPITAL_COMMUNITY): Payer: Managed Care, Other (non HMO)

## 2012-01-04 ENCOUNTER — Encounter (HOSPITAL_COMMUNITY): Payer: Self-pay | Admitting: *Deleted

## 2012-01-04 DIAGNOSIS — Z8739 Personal history of other diseases of the musculoskeletal system and connective tissue: Secondary | ICD-10-CM | POA: Insufficient documentation

## 2012-01-04 DIAGNOSIS — F17200 Nicotine dependence, unspecified, uncomplicated: Secondary | ICD-10-CM | POA: Insufficient documentation

## 2012-01-04 DIAGNOSIS — S8000XA Contusion of unspecified knee, initial encounter: Secondary | ICD-10-CM | POA: Insufficient documentation

## 2012-01-04 DIAGNOSIS — S8001XA Contusion of right knee, initial encounter: Secondary | ICD-10-CM

## 2012-01-04 DIAGNOSIS — Z8719 Personal history of other diseases of the digestive system: Secondary | ICD-10-CM | POA: Insufficient documentation

## 2012-01-04 DIAGNOSIS — Z794 Long term (current) use of insulin: Secondary | ICD-10-CM | POA: Insufficient documentation

## 2012-01-04 DIAGNOSIS — E119 Type 2 diabetes mellitus without complications: Secondary | ICD-10-CM | POA: Insufficient documentation

## 2012-01-04 DIAGNOSIS — Y93H9 Activity, other involving exterior property and land maintenance, building and construction: Secondary | ICD-10-CM | POA: Insufficient documentation

## 2012-01-04 DIAGNOSIS — Z862 Personal history of diseases of the blood and blood-forming organs and certain disorders involving the immune mechanism: Secondary | ICD-10-CM | POA: Insufficient documentation

## 2012-01-04 DIAGNOSIS — Z9889 Other specified postprocedural states: Secondary | ICD-10-CM | POA: Insufficient documentation

## 2012-01-04 DIAGNOSIS — Y92009 Unspecified place in unspecified non-institutional (private) residence as the place of occurrence of the external cause: Secondary | ICD-10-CM | POA: Insufficient documentation

## 2012-01-04 DIAGNOSIS — W208XXA Other cause of strike by thrown, projected or falling object, initial encounter: Secondary | ICD-10-CM | POA: Insufficient documentation

## 2012-01-04 LAB — GLUCOSE, CAPILLARY: Glucose-Capillary: 228 mg/dL — ABNORMAL HIGH (ref 70–99)

## 2012-01-04 MED ORDER — INSULIN REGULAR HUMAN 100 UNIT/ML IJ SOLN: INTRAMUSCULAR | Status: DC

## 2012-01-04 MED ORDER — TRAMADOL-ACETAMINOPHEN 37.5-325 MG PO TABS: ORAL_TABLET | ORAL | Status: DC

## 2012-01-04 MED ORDER — HYDROCODONE-ACETAMINOPHEN 5-325 MG PO TABS: ORAL_TABLET | ORAL | Status: DC

## 2012-01-04 MED ORDER — HYDROCODONE-ACETAMINOPHEN 5-325 MG PO TABS
2.0000 | ORAL_TABLET | Freq: Once | ORAL | Status: AC
  Administered 2012-01-04: 2 via ORAL
  Filled 2012-01-04: qty 2

## 2012-01-04 NOTE — ED Notes (Signed)
Pt was sawing a tree an hour ago to remove from driveway, states that tree had tension and snapped and popped back on his right knee, per pt had surgery may 2013

## 2012-01-04 NOTE — ED Provider Notes (Signed)
History   This chart was scribed for Janice Norrie, MD by Hampton Abbot, ED Scribe. This patient was seen in room APA16A/APA16A and the patient's care was started at 7:48 PM    CSN: [id number]  Arrival date & time 01/04/12  1933   First MD Initiated Contact with Patient 01/04/12 1940      Chief Complaint  Patient presents with  . Knee Pain     The history is provided by the patient. No language interpreter was used.   Brent Daniels is a 26 y.o. male with h/o JODM and GERD who presents to the Emergency Department complaining of right knee pain after cutting a tree branch loose that was under tension and it snapped back against medial aspect of his right knee today.  Pt was able to ambulate afterwards but with high level of pain and a limp after about 20 minutes and then with assistance.  Has pain on standing and ROM. Pt had ACL reconstructive surgery in May 2013 by Dr. Aline Brochure and only recently removed knee immobilizer 2 weeks ago and finished his rehab about 6 weeks ago.    Pt has no further complaints and states BG levels have been normal and under control recently. States his CBG this time of day is usually around 200.  Pt is a current everyday smoker but denies alcohol use.    PCP is Dr. Wenda Overland. Orthopedist Dr Aline Brochure  Past Medical History  Diagnosis Date  . Heart murmur   . Diabetes mellitus     type 1  Since 26 yo  . Arthritis   . Back pain   . GERD (gastroesophageal reflux disease)     Past Surgical History  Procedure Date  . Tympanostomy tube placement   . Knee arthroscopy 06/30/2011    Procedure: ARTHROSCOPY KNEE;  Surgeon: Carole Civil, MD;  Location: [facility];  Service: Orthopedics;  Laterality: Right;  diagnostic arthroscopy    Family History  Problem Relation Age of Onset  . Cancer Father   . Pseudochol deficiency Neg Hx   . Malignant hyperthermia Neg Hx   . Hypotension Neg Hx   . Anesthesia problems Neg Hx     History  Substance Use Topics  .  Smoking status: Current Every Day Smoker -- 1.0 packs/day for 10 years    Types: Cigarettes  . Smokeless tobacco: Former Systems developer    Quit date: 09/17/2000  . Alcohol Use: No   Full time student   Review of Systems  Musculoskeletal:       Right knee pain.  Skin: Negative for wound.    Allergies  Ibuprofen; Naproxen; Sulfa antibiotics; and Morphine and related  Home Medications   Current Outpatient Rx  Name  Route  Sig  Dispense  Refill  . INSULIN REGULAR HUMAN 100 UNIT/ML IJ SOLN   Subcutaneous   Inject into the skin as directed. Sliding Scale. Inject required units based on Blood Glucose levels up to 5 times daily with meals.           BP 141/90  Pulse 106  Temp 98 F (36.7 C) (Oral)  Resp 18  Ht 6\' 2"  (1.88 m)  Wt 185 lb (83.915 kg)  BMI 23.75 kg/m2  SpO2 100%  Vital signs normal except tachycardia   Physical Exam  Nursing note and vitals reviewed. Constitutional: He is oriented to person, place, and time. He appears well-developed and well-nourished.  Non-toxic appearance. He does not appear ill. No distress.  HENT:  Head: Normocephalic and atraumatic.  Right Ear: External ear normal.  Left Ear: External ear normal.  Nose: Nose normal. No mucosal edema or rhinorrhea.  Mouth/Throat: Oropharynx is clear and moist and mucous membranes are normal. No dental abscesses or uvula swelling.  Eyes: Conjunctivae normal and EOM are normal. Pupils are equal, round, and reactive to light.  Neck: Normal range of motion and full passive range of motion without pain. Neck supple.  Pulmonary/Chest: Effort normal and breath sounds normal. No respiratory distress. He has no rhonchi. He exhibits no crepitus.  Abdominal: Normal appearance.  Musculoskeletal: Normal range of motion. He exhibits tenderness. He exhibits no edema.       Legs:      No pain over right shin, no right or left knee joint effusion, right patellar tendon intact, redness and minimal swelling over right knee  medial joint.  Right foot and ankle non-tender, scattered 0.5cm well-healed lesions over lower legs.  Right DP pulse normal  Neurological: He is alert and oriented to person, place, and time. He has normal strength. No cranial nerve deficit.  Skin: Skin is warm, dry and intact. No rash noted. No erythema. No pallor.  Psychiatric: He has a normal mood and affect. His speech is normal and behavior is normal. His mood appears not anxious.    ED Course  Procedures (including critical care time)   Medications  HYDROcodone-acetaminophen (NORCO/VICODIN) 5-325 MG per tablet 2 tablet (2 tablet Oral Given 01/04/12 2008)    DIAGNOSTIC STUDIES: Oxygen Saturation is 100% on room air, normal by my interpretation.    COORDINATION OF CARE: 7:52 PM- Patient informed of clinical course, understands medical decision-making process, and agrees with plan.  Ordered PO Vicodin and right knee XR.  8:31 PM- Informed pt of negative XR.  Also discussed getting back into his brace and using his crutches and to recheck with Dr Aline Brochure this week if he continues to have pain   Wife states she lost his insulin bottle and are requesting a prescription  Labs Reviewed  GLUCOSE, CAPILLARY - Abnormal; Notable for the following:    Glucose-Capillary 228 (*)     All other components within normal limits   Laboratory interpretation all normal except mild hyperglycemia (states this is his usual CBG for this time of day)   Dg Knee Complete 4 Views Right  01/04/2012  *RADIOLOGY REPORT*  Clinical Data: 26 year old male with right knee pain following injury.  RIGHT KNEE - COMPLETE 4+ VIEW  Comparison: 04/14/2010 radiographs  Findings: No evidence of acute fracture, subluxation or dislocation identified.  No joint effusion noted.  No radio-opaque foreign bodies are present.  No focal bony lesions are noted.  The joint spaces are unremarkable.  IMPRESSION: Unremarkable right knee.   Original Report Authenticated By: Margarette Canada,  M.D.      1. Contusion of knee, right    New Prescriptions   HYDROCODONE-ACETAMINOPHEN (NORCO/VICODIN) 5-325 MG PER TABLET    Take 1 or 2 po Q 6hrs for pain   INSULIN REGULAR (HUMULIN R) 100 UNITS/ML INJECTION    Use sliding scale   TRAMADOL-ACETAMINOPHEN (ULTRACET) 37.5-325 MG PER TABLET    2 tabs po QID prn pain    Plan discharge  Rolland Porter, MD, FACEP   MDM   I personally performed the services described in this documentation, which was scribed in my presence. The recorded information has been reviewed and considered.  Rolland Porter, MD, [place],  MD 01/04/12 2049

## 2012-01-04 NOTE — ED Notes (Signed)
Patient with no complaints at this time. Respirations even and unlabored. Skin warm/dry. Discharge instructions reviewed with patient at this time. Patient given opportunity to voice concerns/ask questions. Patient discharged at this time and left Emergency Department with steady gait.   

## 2012-01-05 ENCOUNTER — Encounter (HOSPITAL_COMMUNITY): Payer: Self-pay | Admitting: *Deleted

## 2012-01-05 ENCOUNTER — Emergency Department (HOSPITAL_COMMUNITY)
Admission: EM | Admit: 2012-01-05 | Discharge: 2012-01-05 | Disposition: A | Discharge status: Home / Self Care | Payer: Managed Care, Other (non HMO) | Attending: Emergency Medicine | Admitting: Emergency Medicine

## 2012-01-05 DIAGNOSIS — E1065 Type 1 diabetes mellitus with hyperglycemia: Secondary | ICD-10-CM | POA: Insufficient documentation

## 2012-01-05 DIAGNOSIS — Z8739 Personal history of other diseases of the musculoskeletal system and connective tissue: Secondary | ICD-10-CM | POA: Insufficient documentation

## 2012-01-05 DIAGNOSIS — E1165 Type 2 diabetes mellitus with hyperglycemia: Secondary | ICD-10-CM

## 2012-01-05 DIAGNOSIS — Z794 Long term (current) use of insulin: Secondary | ICD-10-CM | POA: Insufficient documentation

## 2012-01-05 DIAGNOSIS — F17200 Nicotine dependence, unspecified, uncomplicated: Secondary | ICD-10-CM | POA: Insufficient documentation

## 2012-01-05 DIAGNOSIS — IMO0002 Reserved for concepts with insufficient information to code with codable children: Secondary | ICD-10-CM | POA: Insufficient documentation

## 2012-01-05 DIAGNOSIS — M25569 Pain in unspecified knee: Secondary | ICD-10-CM | POA: Insufficient documentation

## 2012-01-05 DIAGNOSIS — Z8679 Personal history of other diseases of the circulatory system: Secondary | ICD-10-CM | POA: Insufficient documentation

## 2012-01-05 DIAGNOSIS — Z8719 Personal history of other diseases of the digestive system: Secondary | ICD-10-CM | POA: Insufficient documentation

## 2012-01-05 DIAGNOSIS — S8000XA Contusion of unspecified knee, initial encounter: Secondary | ICD-10-CM

## 2012-01-05 LAB — GLUCOSE, CAPILLARY
Glucose-Capillary: >600 mg/dL (ref 70–99)
Glucose-Capillary: 305 mg/dL — ABNORMAL HIGH (ref 70–99)
Glucose-Capillary: 510 mg/dL — ABNORMAL HIGH (ref 70–99)

## 2012-01-05 LAB — CBC
HCT: 37.5 % — ABNORMAL LOW (ref 39.0–52.0)
Hemoglobin: 12.5 g/dL — ABNORMAL LOW (ref 13.0–17.0)
MCH: 31.5 pg (ref 26.0–34.0)
MCHC: 33.3 g/dL (ref 30.0–36.0)
MCV: 94.5 fL (ref 78.0–100.0)
Platelets: 225 10*3/uL (ref 150–400)
RBC: 3.97 MIL/uL — ABNORMAL LOW (ref 4.22–5.81)
RDW: 14.4 % (ref 11.5–15.5)
WBC: 8.4 10*3/uL (ref 4.0–10.5)

## 2012-01-05 LAB — BASIC METABOLIC PANEL
BUN: 18 mg/dL (ref 6–23)
CO2: 28 mEq/L (ref 19–32)
Calcium: 9 mg/dL (ref 8.4–10.5)
Chloride: 89 mEq/L — ABNORMAL LOW (ref 96–112)
Creatinine, Ser: 1.16 mg/dL (ref 0.50–1.35)
GFR calc Af Amer: >90 mL/min (ref >90)
GFR calc non Af Amer: 84 mL/min — ABNORMAL LOW (ref >90)
Glucose, Bld: 722 mg/dL (ref 70–99)
Potassium: 4.8 mEq/L (ref 3.5–5.1)
Sodium: 127 mEq/L — ABNORMAL LOW (ref 135–145)

## 2012-01-05 MED ORDER — HYDROMORPHONE HCL PF 1 MG/ML IJ SOLN
INTRAMUSCULAR | Status: AC
  Filled 2012-01-05: qty 1

## 2012-01-05 MED ORDER — SODIUM CHLORIDE 0.9 % IV BOLUS (SEPSIS)
1000.0000 mL | Freq: Once | INTRAVENOUS | Status: AC
  Administered 2012-01-05: 1000 mL via INTRAVENOUS

## 2012-01-05 MED ORDER — HYDROMORPHONE HCL PF 1 MG/ML IJ SOLN
1.0000 mg | Freq: Once | INTRAMUSCULAR | Status: AC
  Administered 2012-01-05: 1 mg via INTRAVENOUS

## 2012-01-05 MED ORDER — HYDROMORPHONE HCL PF 1 MG/ML IJ SOLN
1.0000 mg | Freq: Once | INTRAMUSCULAR | Status: AC
  Administered 2012-01-05: 1 mg via INTRAMUSCULAR

## 2012-01-05 MED ORDER — INSULIN REGULAR HUMAN 100 UNIT/ML IJ SOLN
16.0000 [IU] | Freq: Once | INTRAMUSCULAR | Status: AC
  Administered 2012-01-05: 16 [IU] via INTRAVENOUS
  Filled 2012-01-05: qty 0.16

## 2012-01-05 MED ORDER — INSULIN REGULAR HUMAN 100 UNIT/ML IJ SOLN
12.0000 [IU] | Freq: Once | INTRAMUSCULAR | Status: AC
  Administered 2012-01-05: 12 [IU] via INTRAVENOUS
  Filled 2012-01-05: qty 0.12

## 2012-01-05 NOTE — ED Notes (Signed)
Patient was seen earlier today in er and was dispensed lortab which did not help. Usually  takes percocet for pain per patient

## 2012-01-05 NOTE — ED Notes (Signed)
Patient refused to sign discharge papers. Left with male at side and stating he was going to another hospital to get his percocet. Upon leaving hospital he left papers in hallway .

## 2012-01-05 NOTE — ED Notes (Signed)
CRITICAL VALUE ALERT  Critical value received:  Glucose 722  Date of notification:  01/05/2012  Time of notification:  0303  Critical value read back:yes  Nurse who received alert: Lucy Antigua, RN   MD notified (1st page):  Dr. Wilson Singer

## 2012-01-05 NOTE — ED Provider Notes (Signed)
History    26yM with R knee pain. Seen in ED early today for same. Cutting tree branch under tension and snapped back and struck in R knee. Coming back because says told to if pain got worse. Denies new trauma. No numbness or tingling. Discharged with lortab. Requesting percocet. Pt with hx of IDDM. Says out of insulin just since today. Has script he can fill though. CSN: [id number]  Arrival date & time 01/05/12  0143   First MD Initiated Contact with Patient 01/05/12 0151      Chief Complaint  Patient presents with  . Knee Pain    (Consider location/radiation/quality/duration/timing/severity/associated sxs/prior treatment) HPI  Past Medical History  Diagnosis Date  . Heart murmur   . Diabetes mellitus     type 1  . Arthritis   . Back pain   . GERD (gastroesophageal reflux disease)     Past Surgical History  Procedure Date  . Tympanostomy tube placement   . Knee arthroscopy 06/30/2011    Procedure: ARTHROSCOPY KNEE;  Surgeon: Carole Civil, MD;  Location: [facility];  Service: Orthopedics;  Laterality: Right;  diagnostic arthroscopy    Family History  Problem Relation Age of Onset  . Cancer Father   . Pseudochol deficiency Neg Hx   . Malignant hyperthermia Neg Hx   . Hypotension Neg Hx   . Anesthesia problems Neg Hx     History  Substance Use Topics  . Smoking status: Current Every Day Smoker -- 1.0 packs/day for 10 years    Types: Cigarettes  . Smokeless tobacco: Former Systems developer    Quit date: 09/17/2000  . Alcohol Use: No      Review of Systems   Review of symptoms negative unless otherwise noted in HPI.   Allergies  Ibuprofen; Naproxen; Sulfa antibiotics; and Morphine and related  Home Medications   Current Outpatient Rx  Name  Route  Sig  Dispense  Refill  . HYDROCODONE-ACETAMINOPHEN 5-325 MG PO TABS      Take 1 or 2 po Q 6hrs for pain   6 tablet   0   . INSULIN REGULAR HUMAN 100 UNIT/ML IJ SOLN      Use sliding scale   10 mL   12   .  INSULIN REGULAR HUMAN 100 UNIT/ML IJ SOLN   Subcutaneous   Inject into the skin as directed. Sliding Scale. Inject required units based on Blood Glucose levels up to 5 times daily with meals.         . TRAMADOL-ACETAMINOPHEN 37.5-325 MG PO TABS      2 tabs po QID prn pain   16 tablet   0     BP 114/74  Pulse 75  Temp 97.8 F (36.6 C) (Oral)  Resp 20  Ht 6\' 2"  (1.88 m)  Wt 185 lb (83.915 kg)  BMI 23.75 kg/m2  SpO2 99%  Physical Exam  Nursing note and vitals reviewed. Constitutional: He appears well-developed and well-nourished. No distress.  HENT:  Head: Normocephalic and atraumatic.  Mouth/Throat: Oropharynx is clear and moist.       Mucus membranes moist.  Eyes: Conjunctivae normal are normal. Right eye exhibits no discharge. Left eye exhibits no discharge.  Neck: Neck supple.  Cardiovascular: Normal rate, regular rhythm and normal heart sounds.  Exam reveals no gallop and no friction rub.   No murmur heard. Pulmonary/Chest: Effort normal and breath sounds normal. No respiratory distress.  Abdominal: Soft. He exhibits no distension. There is no tenderness.  Musculoskeletal:  He exhibits no edema and no tenderness.       contusion to medial aspect R knee. NVI distally. Skin intact.   Neurological: He is alert.  Skin: Skin is warm and dry.  Psychiatric: He has a normal mood and affect. His behavior is normal. Thought content normal.    ED Course  Procedures (including critical care time)  Labs Reviewed  BASIC METABOLIC PANEL - Abnormal; Notable for the following:    Sodium 127 (*)     Chloride 89 (*)     Glucose, Bld 722 (*)     GFR calc non Af Amer 84 (*)     All other components within normal limits  CBC - Abnormal; Notable for the following:    RBC 3.97 (*)     Hemoglobin 12.5 (*)     HCT 37.5 (*)     All other components within normal limits  GLUCOSE, CAPILLARY - Abnormal; Notable for the following:    Glucose-Capillary >600 (*)     All other components  within normal limits  GLUCOSE, CAPILLARY - Abnormal; Notable for the following:    Glucose-Capillary 510 (*)     All other components within normal limits  GLUCOSE, CAPILLARY - Abnormal; Notable for the following:    Glucose-Capillary 305 (*)     All other components within normal limits  LAB REPORT - SCANNED   Dg Knee Complete 4 Views Right  01/04/2012  *RADIOLOGY REPORT*  Clinical Data: 26 year old male with right knee pain following injury.  RIGHT KNEE - COMPLETE 4+ VIEW  Comparison: 04/14/2010 radiographs  Findings: No evidence of acute fracture, subluxation or dislocation identified.  No joint effusion noted.  No radio-opaque foreign bodies are present.  No focal bony lesions are noted.  The joint spaces are unremarkable.  IMPRESSION: Unremarkable right knee.   Original Report Authenticated By: Margarette Canada, M.D.      1. Uncontrolled diabetes mellitus   2. Knee contusion       MDM  26 year old male with right knee pain. Patient was evaluated for the same earlier in the ER today. Denies any interim trauma since last evaluation. Imaging from this visit was reviewed. No acute osseous abnormality. Per patient and nursing report, swelling has improved since his last evaluation. Is requesting additional narcotic pain medication. He was was given multiple doses of IV narcotics in the ER today. Without evidence of fracture do not feel that he needs another prescription for more narcotics. Is also noted to be significantly hyperglycemic without evidence of metabolic acidosis. He was treated with IV fluids and insulin with improved blood glucose level. Patient states that had only been out of insulin for the past day and has prescription he can fill. Will DC for outpt FU.        Virgel Manifold, MD 01/08/12 [phone]

## 2012-01-09 MED FILL — Hydrocodone-Acetaminophen Tab 5-325 MG: ORAL | Qty: 6 | Status: AC

## 2012-06-21 ENCOUNTER — Encounter (HOSPITAL_COMMUNITY): Payer: Self-pay

## 2012-06-27 ENCOUNTER — Ambulatory Visit (HOSPITAL_COMMUNITY): Payer: Self-pay | Admitting: Psychiatry

## 2012-07-11 ENCOUNTER — Ambulatory Visit (HOSPITAL_COMMUNITY): Payer: 59 | Admitting: Psychiatry

## 2012-07-11 ENCOUNTER — Ambulatory Visit (INDEPENDENT_AMBULATORY_CARE_PROVIDER_SITE_OTHER): Payer: 59 | Admitting: Psychiatry

## 2012-07-11 ENCOUNTER — Encounter (HOSPITAL_COMMUNITY): Payer: Self-pay | Admitting: Psychiatry

## 2012-07-11 VITALS — BP 114/61 | HR 98 | Ht 73.0 in | Wt 180.4 lb

## 2012-07-11 DIAGNOSIS — E1122 Type 2 diabetes mellitus with diabetic chronic kidney disease: Secondary | ICD-10-CM | POA: Insufficient documentation

## 2012-07-11 DIAGNOSIS — M419 Scoliosis, unspecified: Secondary | ICD-10-CM

## 2012-07-11 DIAGNOSIS — M549 Dorsalgia, unspecified: Secondary | ICD-10-CM

## 2012-07-11 DIAGNOSIS — F329 Major depressive disorder, single episode, unspecified: Secondary | ICD-10-CM

## 2012-07-11 DIAGNOSIS — F411 Generalized anxiety disorder: Secondary | ICD-10-CM

## 2012-07-11 DIAGNOSIS — I10 Essential (primary) hypertension: Secondary | ICD-10-CM

## 2012-07-11 DIAGNOSIS — E119 Type 2 diabetes mellitus without complications: Secondary | ICD-10-CM

## 2012-07-11 DIAGNOSIS — F3289 Other specified depressive episodes: Secondary | ICD-10-CM

## 2012-07-11 HISTORY — DX: Scoliosis, unspecified: M41.9

## 2012-07-11 MED ORDER — CLONAZEPAM 0.5 MG PO TABS: 0.5000 mg | ORAL_TABLET | Freq: Every evening | ORAL | Status: DC | PRN

## 2012-07-11 MED ORDER — DULOXETINE HCL 30 MG PO CPEP: ORAL_CAPSULE | ORAL | Status: DC

## 2012-07-11 NOTE — Patient Instructions (Signed)
You are schedule to See Dr Sima Matas on June 9th at 1:45 PM.
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
## 2012-07-15 ENCOUNTER — Ambulatory Visit (HOSPITAL_COMMUNITY): Payer: Self-pay | Admitting: Psychology

## 2013-06-15 ENCOUNTER — Emergency Department (HOSPITAL_COMMUNITY): Payer: Managed Care, Other (non HMO)

## 2013-06-15 ENCOUNTER — Inpatient Hospital Stay (HOSPITAL_COMMUNITY)
Admission: EM | Admit: 2013-06-15 | Discharge: 2013-06-18 | DRG: 637 | Disposition: A | Discharge status: Home / Self Care | Payer: Managed Care, Other (non HMO) | Attending: Internal Medicine | Admitting: Internal Medicine

## 2013-06-15 ENCOUNTER — Encounter (HOSPITAL_COMMUNITY): Payer: Self-pay | Admitting: Emergency Medicine

## 2013-06-15 DIAGNOSIS — IMO0001 Reserved for inherently not codable concepts without codable children: Secondary | ICD-10-CM | POA: Diagnosis present

## 2013-06-15 DIAGNOSIS — M129 Arthropathy, unspecified: Secondary | ICD-10-CM | POA: Diagnosis present

## 2013-06-15 DIAGNOSIS — K209 Esophagitis, unspecified without bleeding: Secondary | ICD-10-CM | POA: Diagnosis present

## 2013-06-15 DIAGNOSIS — G8929 Other chronic pain: Secondary | ICD-10-CM | POA: Diagnosis present

## 2013-06-15 DIAGNOSIS — B3781 Candidal esophagitis: Secondary | ICD-10-CM | POA: Diagnosis present

## 2013-06-15 DIAGNOSIS — E1165 Type 2 diabetes mellitus with hyperglycemia: Secondary | ICD-10-CM

## 2013-06-15 DIAGNOSIS — K208 Other esophagitis without bleeding: Secondary | ICD-10-CM | POA: Diagnosis present

## 2013-06-15 DIAGNOSIS — Z9119 Patient's noncompliance with other medical treatment and regimen: Secondary | ICD-10-CM

## 2013-06-15 DIAGNOSIS — I10 Essential (primary) hypertension: Secondary | ICD-10-CM | POA: Diagnosis present

## 2013-06-15 DIAGNOSIS — K2971 Gastritis, unspecified, with bleeding: Secondary | ICD-10-CM | POA: Diagnosis present

## 2013-06-15 DIAGNOSIS — G589 Mononeuropathy, unspecified: Secondary | ICD-10-CM | POA: Diagnosis present

## 2013-06-15 DIAGNOSIS — K920 Hematemesis: Secondary | ICD-10-CM

## 2013-06-15 DIAGNOSIS — E1110 Type 2 diabetes mellitus with ketoacidosis without coma: Secondary | ICD-10-CM | POA: Diagnosis present

## 2013-06-15 DIAGNOSIS — E876 Hypokalemia: Secondary | ICD-10-CM | POA: Diagnosis present

## 2013-06-15 DIAGNOSIS — IMO0002 Reserved for concepts with insufficient information to code with codable children: Secondary | ICD-10-CM

## 2013-06-15 DIAGNOSIS — K3184 Gastroparesis: Secondary | ICD-10-CM | POA: Diagnosis present

## 2013-06-15 DIAGNOSIS — Z794 Long term (current) use of insulin: Secondary | ICD-10-CM

## 2013-06-15 DIAGNOSIS — F17200 Nicotine dependence, unspecified, uncomplicated: Secondary | ICD-10-CM | POA: Diagnosis present

## 2013-06-15 DIAGNOSIS — K219 Gastro-esophageal reflux disease without esophagitis: Secondary | ICD-10-CM | POA: Diagnosis present

## 2013-06-15 DIAGNOSIS — E119 Type 2 diabetes mellitus without complications: Secondary | ICD-10-CM

## 2013-06-15 DIAGNOSIS — Z91199 Patient's noncompliance with other medical treatment and regimen due to unspecified reason: Secondary | ICD-10-CM

## 2013-06-15 DIAGNOSIS — M549 Dorsalgia, unspecified: Secondary | ICD-10-CM | POA: Diagnosis present

## 2013-06-15 DIAGNOSIS — K259 Gastric ulcer, unspecified as acute or chronic, without hemorrhage or perforation: Secondary | ICD-10-CM

## 2013-06-15 DIAGNOSIS — E1010 Type 1 diabetes mellitus with ketoacidosis without coma: Principal | ICD-10-CM | POA: Diagnosis present

## 2013-06-15 DIAGNOSIS — E860 Dehydration: Secondary | ICD-10-CM | POA: Diagnosis present

## 2013-06-15 DIAGNOSIS — D72829 Elevated white blood cell count, unspecified: Secondary | ICD-10-CM | POA: Diagnosis present

## 2013-06-15 DIAGNOSIS — K254 Chronic or unspecified gastric ulcer with hemorrhage: Secondary | ICD-10-CM | POA: Diagnosis present

## 2013-06-15 DIAGNOSIS — K2960 Other gastritis without bleeding: Secondary | ICD-10-CM | POA: Diagnosis present

## 2013-06-15 DIAGNOSIS — K2991 Gastroduodenitis, unspecified, with bleeding: Secondary | ICD-10-CM

## 2013-06-15 HISTORY — DX: Essential (primary) hypertension: I10

## 2013-06-15 LAB — BASIC METABOLIC PANEL
BUN: 28 mg/dL — ABNORMAL HIGH (ref 6–23)
BUN: 28 mg/dL — ABNORMAL HIGH (ref 6–23)
BUN: 29 mg/dL — AB (ref 6–23)
BUN: 26 mg/dL — ABNORMAL HIGH (ref 6–23)
BUN: 26 mg/dL — ABNORMAL HIGH (ref 6–23)
CHLORIDE: 84 meq/L — AB (ref 96–112)
CHLORIDE: 89 meq/L — AB (ref 96–112)
CO2: 16 meq/L — AB (ref 19–32)
CO2: 14 meq/L — AB (ref 19–32)
CO2: 14 mEq/L — ABNORMAL LOW (ref 19–32)
CO2: 19 meq/L (ref 19–32)
CO2: 20 meq/L (ref 19–32)
Calcium: 10.3 mg/dL (ref 8.4–10.5)
Calcium: 9 mg/dL (ref 8.4–10.5)
Calcium: 8.9 mg/dL (ref 8.4–10.5)
Calcium: 8.8 mg/dL (ref 8.4–10.5)
Calcium: 9 mg/dL (ref 8.4–10.5)
Chloride: 74 mEq/L — ABNORMAL LOW (ref 96–112)
Chloride: 81 mEq/L — ABNORMAL LOW (ref 96–112)
Chloride: 89 mEq/L — ABNORMAL LOW (ref 96–112)
Creatinine, Ser: 1.16 mg/dL (ref 0.50–1.35)
Creatinine, Ser: 1.02 mg/dL (ref 0.50–1.35)
Creatinine, Ser: 1.03 mg/dL (ref 0.50–1.35)
Creatinine, Ser: 0.98 mg/dL (ref 0.50–1.35)
Creatinine, Ser: 0.95 mg/dL (ref 0.50–1.35)
GFR calc Af Amer: >90 mL/min (ref >90)
GFR calc Af Amer: >90 mL/min (ref >90)
GFR calc Af Amer: >90 mL/min (ref >90)
GFR calc Af Amer: >90 mL/min (ref >90)
GFR calc non Af Amer: 84 mL/min — ABNORMAL LOW (ref >90)
GFR calc non Af Amer: >90 mL/min (ref >90)
GFR calc non Af Amer: >90 mL/min (ref >90)
GLUCOSE: 668 mg/dL — AB (ref 70–99)
GLUCOSE: 643 mg/dL — AB (ref 70–99)
GLUCOSE: 569 mg/dL — AB (ref 70–99)
GLUCOSE: 279 mg/dL — AB (ref 70–99)
Glucose, Bld: 353 mg/dL — ABNORMAL HIGH (ref 70–99)
POTASSIUM: 4.4 meq/L (ref 3.7–5.3)
POTASSIUM: 4.4 meq/L (ref 3.7–5.3)
POTASSIUM: 4.1 meq/L (ref 3.7–5.3)
POTASSIUM: 4 meq/L (ref 3.7–5.3)
POTASSIUM: 3.6 meq/L — AB (ref 3.7–5.3)
SODIUM: 131 meq/L — AB (ref 137–147)
SODIUM: 133 meq/L — AB (ref 137–147)
SODIUM: 135 meq/L — AB (ref 137–147)
SODIUM: 135 meq/L — AB (ref 137–147)
Sodium: 134 mEq/L — ABNORMAL LOW (ref 137–147)

## 2013-06-15 LAB — CBC WITH DIFFERENTIAL/PLATELET
Basophils Absolute: 0.1 10*3/uL (ref 0.0–0.1)
Basophils Relative: 0 % (ref 0–1)
EOS PCT: 0 % (ref 0–5)
Eosinophils Absolute: 0 10*3/uL (ref 0.0–0.7)
HCT: 45.7 % (ref 39.0–52.0)
HEMOGLOBIN: 15.6 g/dL (ref 13.0–17.0)
LYMPHS ABS: 1.4 10*3/uL (ref 0.7–4.0)
LYMPHS PCT: 7 % — AB (ref 12–46)
MCH: 31.3 pg (ref 26.0–34.0)
MCHC: 34.1 g/dL (ref 30.0–36.0)
MCV: 91.6 fL (ref 78.0–100.0)
Monocytes Absolute: 1 10*3/uL (ref 0.1–1.0)
Monocytes Relative: 5 % (ref 3–12)
NEUTROS PCT: 88 % — AB (ref 43–77)
Neutro Abs: 16.7 10*3/uL — ABNORMAL HIGH (ref 1.7–7.7)
PLATELETS: 410 10*3/uL — AB (ref 150–400)
RBC: 4.99 MIL/uL (ref 4.22–5.81)
RDW: 13.4 % (ref 11.5–15.5)
WBC: 19.1 10*3/uL — AB (ref 4.0–10.5)

## 2013-06-15 LAB — GLUCOSE, CAPILLARY
GLUCOSE-CAPILLARY: 326 mg/dL — AB (ref 70–99)
GLUCOSE-CAPILLARY: 256 mg/dL — AB (ref 70–99)
Glucose-Capillary: 394 mg/dL — ABNORMAL HIGH (ref 70–99)
Glucose-Capillary: 321 mg/dL — ABNORMAL HIGH (ref 70–99)
Glucose-Capillary: 243 mg/dL — ABNORMAL HIGH (ref 70–99)

## 2013-06-15 LAB — URINALYSIS, ROUTINE W REFLEX MICROSCOPIC
Bilirubin Urine: NEGATIVE
GLUCOSE, UA: 500 mg/dL — AB
HGB URINE DIPSTICK: NEGATIVE
Ketones, ur: >80 mg/dL — AB
Leukocytes, UA: NEGATIVE
Nitrite: NEGATIVE
Protein, ur: NEGATIVE mg/dL
SPECIFIC GRAVITY, URINE: 1.025 (ref 1.005–1.030)
Urobilinogen, UA: 0.2 mg/dL (ref 0.0–1.0)
pH: 5.5 (ref 5.0–8.0)

## 2013-06-15 LAB — RAPID URINE DRUG SCREEN, HOSP PERFORMED
Amphetamines: NOT DETECTED
BARBITURATES: NOT DETECTED
Benzodiazepines: NOT DETECTED
Cocaine: NOT DETECTED
Opiates: NOT DETECTED
Tetrahydrocannabinol: NOT DETECTED

## 2013-06-15 LAB — MRSA PCR SCREENING: MRSA by PCR: NEGATIVE

## 2013-06-15 LAB — LACTIC ACID, PLASMA: Lactic Acid, Venous: 2.2 mmol/L (ref 0.5–2.2)

## 2013-06-15 LAB — CBG MONITORING, ED: Glucose-Capillary: 573 mg/dL (ref 70–99)

## 2013-06-15 LAB — LIPASE, BLOOD: Lipase: 8 U/L — ABNORMAL LOW (ref 11–59)

## 2013-06-15 LAB — MAGNESIUM: Magnesium: 2.2 mg/dL (ref 1.5–2.5)

## 2013-06-15 MED ORDER — SODIUM CHLORIDE 0.9 % IV SOLN
INTRAVENOUS | Status: AC
  Administered 2013-06-15: 18:00:00 via INTRAVENOUS

## 2013-06-15 MED ORDER — BIOTENE DRY MOUTH MT LIQD
15.0000 mL | Freq: Two times a day (BID) | OROMUCOSAL | Status: DC
  Administered 2013-06-15 – 2013-06-18 (×6): 15 mL via OROMUCOSAL

## 2013-06-15 MED ORDER — ENOXAPARIN SODIUM 40 MG/0.4ML ~~LOC~~ SOLN: 40.0000 mg | SUBCUTANEOUS | Status: DC

## 2013-06-15 MED ORDER — PROMETHAZINE HCL 25 MG/ML IJ SOLN
25.0000 mg | INTRAMUSCULAR | Status: DC | PRN
  Administered 2013-06-15 – 2013-06-17 (×3): 25 mg via INTRAVENOUS
  Filled 2013-06-15 (×3): qty 1

## 2013-06-15 MED ORDER — SODIUM CHLORIDE 0.9 % IV SOLN: INTRAVENOUS | Status: AC

## 2013-06-15 MED ORDER — GI COCKTAIL ~~LOC~~
30.0000 mL | Freq: Once | ORAL | Status: AC
  Administered 2013-06-15: 30 mL via ORAL
  Filled 2013-06-15: qty 30

## 2013-06-15 MED ORDER — FAMOTIDINE IN NACL 20-0.9 MG/50ML-% IV SOLN
INTRAVENOUS | Status: AC
  Filled 2013-06-15: qty 50

## 2013-06-15 MED ORDER — DEXTROSE 50 % IV SOLN: 25.0000 mL | INTRAVENOUS | Status: DC | PRN

## 2013-06-15 MED ORDER — SODIUM CHLORIDE 0.9 % IV BOLUS (SEPSIS)
1000.0000 mL | Freq: Once | INTRAVENOUS | Status: AC
  Administered 2013-06-15: 1000 mL via INTRAVENOUS

## 2013-06-15 MED ORDER — LORAZEPAM BOLUS VIA INFUSION: 1.0000 mg | Freq: Four times a day (QID) | INTRAVENOUS | Status: DC | PRN

## 2013-06-15 MED ORDER — LORAZEPAM 2 MG/ML IJ SOLN
2.0000 mg | Freq: Once | INTRAMUSCULAR | Status: AC
  Administered 2013-06-15: 2 mg via INTRAVENOUS
  Filled 2013-06-15: qty 1

## 2013-06-15 MED ORDER — POTASSIUM CHLORIDE 10 MEQ/100ML IV SOLN
10.0000 meq | INTRAVENOUS | Status: AC
  Administered 2013-06-15 (×2): 10 meq via INTRAVENOUS
  Filled 2013-06-15: qty 100

## 2013-06-15 MED ORDER — HYDRALAZINE HCL 20 MG/ML IJ SOLN: 10.0000 mg | Freq: Four times a day (QID) | INTRAMUSCULAR | Status: DC | PRN

## 2013-06-15 MED ORDER — SODIUM CHLORIDE 0.9 % IV SOLN
INTRAVENOUS | Status: DC
  Administered 2013-06-15: 5.1 [IU]/h via INTRAVENOUS
  Administered 2013-06-16: 10:00:00 via INTRAVENOUS
  Filled 2013-06-15: qty 1

## 2013-06-15 MED ORDER — SODIUM CHLORIDE 0.9 % IV SOLN
Freq: Once | INTRAVENOUS | Status: AC
  Administered 2013-06-15: 1000 mL via INTRAVENOUS

## 2013-06-15 MED ORDER — DEXTROSE-NACL 5-0.45 % IV SOLN: INTRAVENOUS | Status: DC

## 2013-06-15 MED ORDER — SODIUM CHLORIDE 0.9 % IV SOLN
INTRAVENOUS | Status: DC
  Filled 2013-06-15: qty 1

## 2013-06-15 MED ORDER — SODIUM CHLORIDE 0.9 % IV SOLN: INTRAVENOUS | Status: DC

## 2013-06-15 MED ORDER — FAMOTIDINE IN NACL 20-0.9 MG/50ML-% IV SOLN
20.0000 mg | Freq: Two times a day (BID) | INTRAVENOUS | Status: DC
  Administered 2013-06-15: 20 mg via INTRAVENOUS
  Filled 2013-06-15 (×2): qty 50

## 2013-06-15 MED ORDER — SODIUM CHLORIDE 0.9 % IV SOLN
INTRAVENOUS | Status: DC
  Administered 2013-06-15 (×2): via INTRAVENOUS

## 2013-06-15 MED ORDER — INSULIN ASPART 100 UNIT/ML IV SOLN
10.0000 [IU] | Freq: Once | INTRAVENOUS | Status: AC
  Administered 2013-06-15: 10 [IU] via INTRAVENOUS

## 2013-06-15 MED ORDER — LORAZEPAM 2 MG/ML IJ SOLN
1.0000 mg | Freq: Four times a day (QID) | INTRAMUSCULAR | Status: DC | PRN
  Administered 2013-06-16 (×2): 1 mg via INTRAVENOUS
  Filled 2013-06-15 (×2): qty 1

## 2013-06-15 MED ORDER — HYDROMORPHONE HCL PF 1 MG/ML IJ SOLN
1.0000 mg | Freq: Once | INTRAMUSCULAR | Status: AC
  Administered 2013-06-15: 1 mg via INTRAVENOUS
  Filled 2013-06-15: qty 1

## 2013-06-15 MED ORDER — HYDROMORPHONE HCL PF 1 MG/ML IJ SOLN
1.0000 mg | INTRAMUSCULAR | Status: DC | PRN
  Administered 2013-06-15 – 2013-06-16 (×3): 1 mg via INTRAVENOUS
  Filled 2013-06-15 (×3): qty 1

## 2013-06-15 MED ORDER — METOCLOPRAMIDE HCL 5 MG/ML IJ SOLN
10.0000 mg | Freq: Four times a day (QID) | INTRAMUSCULAR | Status: DC | PRN
  Administered 2013-06-15 – 2013-06-16 (×2): 10 mg via INTRAVENOUS
  Filled 2013-06-15 (×2): qty 2

## 2013-06-15 MED ORDER — ONDANSETRON HCL 4 MG/2ML IJ SOLN
4.0000 mg | Freq: Once | INTRAMUSCULAR | Status: AC
  Administered 2013-06-15: 4 mg via INTRAVENOUS
  Filled 2013-06-15: qty 2

## 2013-06-15 MED ORDER — DEXTROSE-NACL 5-0.45 % IV SOLN
INTRAVENOUS | Status: DC
  Administered 2013-06-15 – 2013-06-16 (×2): via INTRAVENOUS

## 2013-06-15 NOTE — H&P (Signed)
[facility] History and Physical  Brent Daniels [id number] DOB: [birth year] DOA: 06/15/2013  Referring physician: Dr. Roderic Palau PCP: Followed it PCP in [facility] [place]  Chief Complaint:  Nausea and vomiting for 2 days  HPI:  28 year old male with uncontrolled type 1 diabetes mellitus with history of DKA, GERD with hx 26f gastric ulcers and chr back pain, anxiety and depression, presented to the ED with persistent nausea and vomiting for past 2 days . Patient reports ongoing nausea and vomiting with inability to hold anything down. Also has polyuria and polydipsia for the same duration. He reports his symptoms to be similar to DKA that he had about 6 years back. His last hemoglobin A1c was 13 and fingersticks at home range between 150-350. He informs taking his insulin regularly and tries to be compliant with his diet. Reports dizziness and generalized weakness. Also reports epigastric pain. He denies eating anything outside, sick contacts, fevers, chills, headache, blurred vision. Denies chest pain, palpitations, SOB or bowel symptoms. Denies change in weight or appetite.  Course in the ED patient was tachycardic up to 140s. He was difficult to continue to and had mildly elevated blood pressure. He was afebrile. Blood will done showed WBC of 19,000, normal hemoglobin and hematocrit, normal platelets. Chemistry showed anion gap acidosis with gap of 41 (sodium 131, potassium 4.4, chloride 74, CO2 of 16, BUN of 28 and creatinine 1.1 ). Glucose was 668. Weight findings although DKA patient was given 2 L IV normal saline bolus. On my evaluation patient was getting second bag of IV normal saline and was still tachycardic to 120s. Chest x-ray was unremarkable. UA and EKG pending. Patient order for insulin drip and  hospitalists called for admission to step down  Review of Systems:  Constitutional: Denies fever, chills, diaphoresis, appetite change and fatigue.  HEENT: Denies photophobia, eye pain,  redness, hearing loss, ear pain, congestion, sore throat, rhinorrhea, sneezing, mouth sores, trouble swallowing, neck pain, neck stiffness and tinnitus.   Respiratory: Denies SOB, DOE, cough, chest tightness,  and wheezing.   Cardiovascular: Denies chest pain, palpitations and leg swelling.  Gastrointestinal:  nausea, vomiting, abdominal pain, denies diarrhea, constipation, blood in stool and abdominal distention.  Genitourinary: Denies dysuria, urgency, frequency, hematuria, flank pain and difficulty urinating.  Endocrine: Denies hot or cold intolerance,, polyuria, polydipsia. Musculoskeletal: Denies myalgias, back pain, joint swelling, arthralgias and gait problem.  Skin: Denies pallor, rash and wound.  Neurological: Denies dizziness, weakness and lightheadedness, denies seizures, syncope, numbness and headaches.  Psychiatric/Behavioral: Denies mood changes, confusion, nervousness, sleep disturbance and agitation   Past Medical History  Diagnosis Date  . Heart murmur   . Diabetes mellitus     type 1  . Arthritis   . Back pain   . GERD (gastroesophageal reflux disease)   . Multiple gastric ulcers    Past Surgical History  Procedure Laterality Date  . Tympanostomy tube placement    . Knee arthroscopy  06/30/2011    Procedure: ARTHROSCOPY KNEE;  Surgeon: Carole Civil, MD;  Location: [facility];  Service: Orthopedics;  Laterality: Right;  diagnostic arthroscopy   Social History:  reports that he has been smoking Cigarettes.  He has a 10 pack-year smoking history. He quit smokeless tobacco use about 12 years ago. He reports that he does not drink alcohol or use illicit drugs.  Allergies  Allergen Reactions  . Ibuprofen Other (See Comments)    Stomach ulcers  . Naproxen Other (See Comments)    Stomach ulcers  .  Sulfa Antibiotics Other (See Comments)    Stomach ulcers  . Morphine And Related Rash    Family History  Problem Relation Age of Onset  . Cancer Father   . Alcohol  abuse Father   . Pseudochol deficiency Neg Hx   . Malignant hyperthermia Neg Hx   . Hypotension Neg Hx   . Anesthesia problems Neg Hx     Prior to Admission medications   Medication Sig Start Date End Date Taking? Authorizing Provider  gabapentin (NEURONTIN) 600 MG tablet Take 600 mg by mouth 3 (three) times daily.   Yes Historical Provider, MD  insulin NPH-regular Human (NOVOLIN 70/30) (70-30) 100 UNIT/ML injection Inject 15-25 Units into the skin 2 (two) times daily. Patient takes 15 units in the morning and 25 units at bedtime   Yes Historical Provider, MD  insulin regular (HUMULIN R) 100 units/mL injection Use sliding scale 01/04/12  Yes Janice Norrie, MD  insulin regular (HUMULIN R,NOVOLIN R) 100 units/mL injection Inject into the skin as directed. Sliding Scale. Inject required units based on Blood Glucose levels up to 5 times daily with meals.   Yes Historical Provider, MD  lisinopril (PRINIVIL,ZESTRIL) 20 MG tablet Take 20 mg by mouth daily.   Yes Historical Provider, MD  traMADol (ULTRAM) 50 MG tablet Take 50 mg by mouth every 6 (six) hours as needed. pain   Yes Historical Provider, MD     Physical Exam:  Filed Vitals:   06/15/13 1442 06/15/13 1528  BP: 152/85 151/85  Pulse: 141 127  Temp: 98.5 F (36.9 C)   TempSrc: Oral   Resp: 22 20  Height: 6\' 2"  (1.88 m)   Weight: 81.647 kg (180 lb)   SpO2: 100% 100%    Constitutional: Vital signs reviewed.  Young male sitting up in bed in distress with ongoing nausea and vomiting HEENT: no pallor, no icterus, dry oral mucosa, no cervical lymphadenopathy Cardiovascular: S1 and S2 tachycardic, no MRG Chest: CTAB, no wheezes, rales, or rhonchi Abdominal: Soft.  non-distended, bowel sounds are normal, epigastric tenderness to palpation, no masses, organomegaly, or guarding present.  GU: no CVA tenderness Ext: warm, no edema Neurological: A&O x3, non focal  Labs on Admission:  Basic Metabolic Panel:  Recent Labs Lab  06/15/13 1512  NA 131*  K 4.4  CL 74*  CO2 16*  GLUCOSE 668*  BUN 28*  CREATININE 1.16  CALCIUM 10.3   Liver Function Tests: No results found for this basename: AST, ALT, ALKPHOS, BILITOT, PROT, ALBUMIN,  in the last 168 hours No results found for this basename: LIPASE, AMYLASE,  in the last 168 hours No results found for this basename: AMMONIA,  in the last 168 hours CBC:  Recent Labs Lab 06/15/13 1512  WBC 19.1*  NEUTROABS 16.7*  HGB 15.6  HCT 45.7  MCV 91.6  PLT 410*   Cardiac Enzymes: No results found for this basename: CKTOTAL, CKMB, CKMBINDEX, TROPONINI,  in the last 168 hours BNP: No components found with this basename: POCBNP,  CBG:  Recent Labs Lab 06/15/13 1439  GLUCAP >600*    Radiological Exams on Admission: Dg Chest Portable 1 View  06/15/2013   CLINICAL DATA:  Abdominal pain shortness of breath.  EXAM: PORTABLE CHEST - 1 VIEW  COMPARISON:  Single view of the chest 08/14/2011. PA and lateral chest 01/25/2011.  FINDINGS: The lungs are clear. Heart size is normal. There is no pneumothorax or pleural effusion.  IMPRESSION: Negative chest.   Electronically Signed  By: Inge Rise M.D.   On: 06/15/2013 15:39    EKG:   Assessment/Plan  Principal Problem:   DKA (diabetic ketoacidoses)  in the setting of uncontrolled diabetes mellitus Admit to stepdown. Anion gap of 41 on presentation. Started on insulin drip in ED. getting second liter IV normal saline bolus in the ED followed by normal saline at 150 cc per hour.  -Start on a glucose stabilizer  protocol. Check BMET 2 hrs for closure of anion gap. Monitor k and mg. Once anion gap closes will add basal insulin and overlap drip for next 2 hours. -Supportive care with IV Pepcid and antiemetics. -Patient reports his last A1c to be 13. Needs aggressive blood glucose control. Check A1C Diabetic coordinator consult.   Active Problems:   HTN (hypertension) Will place on when necessary  hydralazine.  Abdominal pain with nausea and vomiting Like in the setting of DKA. Check lipase and lactic acid. Check urine for drug screen  Leukocytosis Likely reactive. Monitor in a.m.    Back pain -Cannot take oral pain medications due to persistent nausea and vomiting. We'll place on when necessary dilatated    GERD (gastroesophageal reflux disease) IV Pepcid twice a day. Added GI cocktail if tolerated.       Diet: N.p.o.  DVT prophylaxis: sq lovenox   Code Status: full code Family Communication: discussed with mother at bedside Disposition Plan: Home once improved  Lynnley Doddridge [facility] Pager [phone]  Total time spent on admission :70 minutes  If 7PM-7AM, please contact night-coverage [email or web address] Password [email or web address] 06/15/2013, 5:04 PM

## 2013-06-15 NOTE — ED Provider Notes (Signed)
CSN: [id number]     Arrival date & time 06/15/13  1426 History  This chart was scribed for Maudry Diego, MD by Zettie Pho, ED Scribe. This patient was seen in room [facility] and the patient's care was started at 3:07 PM.   Chief Complaint  Patient presents with  . Emesis   Patient is a 28 y.o. male presenting with vomiting. The history is provided by the patient. No language interpreter was used.  Emesis Severity:  Severe Timing:  Intermittent Quality:  Stomach contents Chronicity:  New Relieved by:  Nothing Worsened by:  Nothing tried Ineffective treatments:  None tried Associated symptoms: abdominal pain, chills, fever, myalgias and sore throat   Associated symptoms: no diarrhea and no headaches   Abdominal pain:    Location:  Generalized   Severity:  Moderate   Onset quality:  Gradual   Timing:  Intermittent   Progression:  Unchanged   Chronicity:  New Fever:    Timing:  Intermittent   Temp source:  Subjective   Progression:  Resolved Myalgias:    Location:  Generalized   Severity:  Moderate   Onset quality:  Gradual   Timing:  Constant   Progression:  Unchanged Sore throat:    Severity:  Mild   Onset quality:  Gradual   Timing:  Constant   Progression:  Unable to specify Risk factors: diabetes    HPI Comments: Brent Daniels is a 28 y.o. male who presents to the Emergency Department complaining of multiple episodes of emesis with associated generalized abdominal pain, subjective fever (patient is afebrile at 98.5 in the ED), chills, sore throat, and diffuse myalgias onset 2-3 days ago. Patient has allergies to ibuprofen, naproxen, morphine and related drugs, sulfa antibiotics. Patient has a history of DM, GERD, and multiple gastric ulcers.   Past Medical History  Diagnosis Date  . Heart murmur   . Diabetes mellitus     type 1  . Arthritis   . Back pain   . GERD (gastroesophageal reflux disease)   . Multiple gastric ulcers    Past Surgical History   Procedure Laterality Date  . Tympanostomy tube placement    . Knee arthroscopy  06/30/2011    Procedure: ARTHROSCOPY KNEE;  Surgeon: Carole Civil, MD;  Location: [facility];  Service: Orthopedics;  Laterality: Right;  diagnostic arthroscopy   Family History  Problem Relation Age of Onset  . Cancer Father   . Alcohol abuse Father   . Pseudochol deficiency Neg Hx   . Malignant hyperthermia Neg Hx   . Hypotension Neg Hx   . Anesthesia problems Neg Hx    History  Substance Use Topics  . Smoking status: Current Every Day Smoker -- 1.00 packs/day for 10 years    Types: Cigarettes  . Smokeless tobacco: Former Systems developer    Quit date: 09/17/2000  . Alcohol Use: No    Review of Systems  Constitutional: Positive for chills. Negative for appetite change and fatigue.  HENT: Positive for sore throat. Negative for congestion, ear discharge and sinus pressure.   Eyes: Negative for discharge.  Respiratory: Negative for cough.   Cardiovascular: Negative for chest pain.  Gastrointestinal: Positive for vomiting and abdominal pain. Negative for diarrhea.  Genitourinary: Negative for frequency and hematuria.  Musculoskeletal: Positive for myalgias. Negative for back pain.  Skin: Negative for rash.  Neurological: Negative for seizures and headaches.  Psychiatric/Behavioral: Negative for hallucinations.      Allergies  Ibuprofen; Naproxen; Sulfa antibiotics; and  Morphine and related  Home Medications   Prior to Admission medications   Medication Sig Start Date End Date Taking? Authorizing Provider  citalopram (CELEXA) 40 MG tablet Take 40 mg by mouth daily.    Historical Provider, MD  clonazePAM (KLONOPIN) 0.5 MG tablet Take 1 tablet (0.5 mg total) by mouth at bedtime as needed for anxiety. 07/11/12 07/11/13  Kathlee Nations, MD  DULoxetine (CYMBALTA) 30 MG capsule Take 1 tab daily for 1 week and than 2 tab daily 07/11/12   Kathlee Nations, MD  gabapentin (NEURONTIN) 600 MG tablet Take 600 mg by mouth 3  (three) times daily.    Historical Provider, MD  HYDROcodone-acetaminophen (NORCO/VICODIN) 5-325 MG per tablet Take 1 or 2 po Q 6hrs for pain 01/04/12   Janice Norrie, MD  insulin regular (HUMULIN R) 100 units/mL injection Use sliding scale 01/04/12   Janice Norrie, MD  insulin regular (HUMULIN R,NOVOLIN R) 100 units/mL injection Inject into the skin as directed. Sliding Scale. Inject required units based on Blood Glucose levels up to 5 times daily with meals.    Historical Provider, MD  lisinopril (PRINIVIL,ZESTRIL) 20 MG tablet Take 20 mg by mouth daily.    Historical Provider, MD  traMADol-acetaminophen (ULTRACET) 37.5-325 MG per tablet 2 tabs po QID prn pain 01/04/12   Janice Norrie, MD   Triage Vitals: BP 152/85  Pulse 141  Temp(Src) 98.5 F (36.9 C) (Oral)  Resp 22  Ht 6\' 2"  (1.88 m)  Wt 180 lb (81.647 kg)  BMI 23.10 kg/m2  SpO2 100%  Physical Exam  Constitutional: He is oriented to person, place, and time. He appears well-developed.  HENT:  Head: Normocephalic.  Slightly dry mucus membranes.   Eyes: Conjunctivae and EOM are normal. No scleral icterus.  Neck: Neck supple. No thyromegaly present.  Cardiovascular: Regular rhythm.  Exam reveals no gallop and no friction rub.   No murmur heard. Tachycardic.   Pulmonary/Chest: No stridor. He has no wheezes. He has no rales. He exhibits no tenderness.  Abdominal: He exhibits no distension. There is tenderness. There is no rebound.  Moderate, generalized tenderness.   Musculoskeletal: Normal range of motion. He exhibits no edema.  Lymphadenopathy:    He has no cervical adenopathy.  Neurological: He is oriented to person, place, and time. He exhibits normal muscle tone. Coordination normal.  Skin: No rash noted. No erythema.  Psychiatric: He has a normal mood and affect. His behavior is normal.    ED Course  Procedures (including critical care time)  DIAGNOSTIC STUDIES: Oxygen Saturation is 100% on room air, normal by my  interpretation.    COORDINATION OF CARE: 3:09 PM- Ordered chest x-ray, UA, CBC, BMP, CBG. Ordered IV fluids, Zofran, and Dilaudid to manage symptoms. Discussed treatment plan with patient at bedside and patient verbalized agreement.     Labs Review Labs Reviewed  CBG MONITORING, ED - Abnormal; Notable for the following:    Glucose-Capillary >600 (*)    All other components within normal limits  CBC WITH DIFFERENTIAL  BASIC METABOLIC PANEL  CBG MONITORING, ED    Imaging Review No results found.   EKG Interpretation None     CRITICAL CARE Performed by: Maudry Diego Total critical care time: 40 Critical care time was exclusive of separately billable procedures and treating other patients. Critical care was necessary to treat or prevent imminent or life-threatening deterioration. Critical care was time spent personally by me on the following activities: development of treatment  plan with patient and/or surrogate as well as nursing, discussions with consultants, evaluation of patient's response to treatment, examination of patient, obtaining history from patient or surrogate, ordering and performing treatments and interventions, ordering and review of laboratory studies, ordering and review of radiographic studies, pulse oximetry and re-evaluation of patient's condition.   MDM   Final diagnoses:  None   The chart was scribed for me under my direct supervision.  I personally performed the history, physical, and medical decision making and all procedures in the evaluation of this patient.Maudry Diego, MD 06/15/13 [phone]

## 2013-06-15 NOTE — ED Notes (Signed)
Pt c/o n/v, generalized pain that started three days ago, ran out of insulin strips yesterday,

## 2013-06-15 NOTE — ED Notes (Signed)
CRITICAL VALUE ALERT  Critical value received:  Glucose-668  Date of notification:  06/15/13  Time of notification:  1554  Critical value read back:yes  Nurse who received alert:  t vogler rn  MD notified (1st page):    Time of first page:   MD notified (2nd page):  Time of second page:  Responding MD:    Time MD responded:

## 2013-06-16 ENCOUNTER — Encounter (HOSPITAL_COMMUNITY): Payer: Self-pay | Admitting: Gastroenterology

## 2013-06-16 DIAGNOSIS — E1110 Type 2 diabetes mellitus with ketoacidosis without coma: Secondary | ICD-10-CM

## 2013-06-16 DIAGNOSIS — M549 Dorsalgia, unspecified: Secondary | ICD-10-CM

## 2013-06-16 DIAGNOSIS — K219 Gastro-esophageal reflux disease without esophagitis: Secondary | ICD-10-CM

## 2013-06-16 DIAGNOSIS — K920 Hematemesis: Secondary | ICD-10-CM

## 2013-06-16 DIAGNOSIS — I10 Essential (primary) hypertension: Secondary | ICD-10-CM

## 2013-06-16 LAB — BASIC METABOLIC PANEL
BUN: 11 mg/dL (ref 6–23)
BUN: 13 mg/dL (ref 6–23)
BUN: 14 mg/dL (ref 6–23)
BUN: 18 mg/dL (ref 6–23)
BUN: 19 mg/dL (ref 6–23)
BUN: 22 mg/dL (ref 6–23)
BUN: 22 mg/dL (ref 6–23)
BUN: 23 mg/dL (ref 6–23)
BUN: 25 mg/dL — ABNORMAL HIGH (ref 6–23)
BUN: 9 mg/dL (ref 6–23)
BUN: 23 mg/dL (ref 6–23)
BUN: 16 mg/dL (ref 6–23)
BUN: 10 mg/dL (ref 6–23)
CALCIUM: 8.8 mg/dL (ref 8.4–10.5)
CALCIUM: 8.8 mg/dL (ref 8.4–10.5)
CALCIUM: 9 mg/dL (ref 8.4–10.5)
CALCIUM: 9 mg/dL (ref 8.4–10.5)
CALCIUM: 8.9 mg/dL (ref 8.4–10.5)
CHLORIDE: 93 meq/L — AB (ref 96–112)
CHLORIDE: 93 meq/L — AB (ref 96–112)
CHLORIDE: 94 meq/L — AB (ref 96–112)
CO2: 24 mEq/L (ref 19–32)
CO2: 24 meq/L (ref 19–32)
CO2: 26 meq/L (ref 19–32)
CO2: 30 meq/L (ref 19–32)
CO2: 31 meq/L (ref 19–32)
CO2: 23 mEq/L (ref 19–32)
CO2: 23 mEq/L (ref 19–32)
CO2: 24 mEq/L (ref 19–32)
CO2: 26 mEq/L (ref 19–32)
CO2: 27 mEq/L (ref 19–32)
CO2: 29 mEq/L (ref 19–32)
CO2: 29 mEq/L (ref 19–32)
CO2: 30 mEq/L (ref 19–32)
CREATININE: 0.78 mg/dL (ref 0.50–1.35)
CREATININE: 0.69 mg/dL (ref 0.50–1.35)
CREATININE: 0.68 mg/dL (ref 0.50–1.35)
CREATININE: 0.68 mg/dL (ref 0.50–1.35)
CREATININE: 0.62 mg/dL (ref 0.50–1.35)
CREATININE: 0.62 mg/dL (ref 0.50–1.35)
Calcium: 8.9 mg/dL (ref 8.4–10.5)
Calcium: 8.9 mg/dL (ref 8.4–10.5)
Calcium: 9 mg/dL (ref 8.4–10.5)
Calcium: 8.9 mg/dL (ref 8.4–10.5)
Calcium: 8.8 mg/dL (ref 8.4–10.5)
Calcium: 8.9 mg/dL (ref 8.4–10.5)
Calcium: 9 mg/dL (ref 8.4–10.5)
Calcium: 9 mg/dL (ref 8.4–10.5)
Chloride: 92 mEq/L — ABNORMAL LOW (ref 96–112)
Chloride: 92 mEq/L — ABNORMAL LOW (ref 96–112)
Chloride: 92 mEq/L — ABNORMAL LOW (ref 96–112)
Chloride: 93 mEq/L — ABNORMAL LOW (ref 96–112)
Chloride: 93 mEq/L — ABNORMAL LOW (ref 96–112)
Chloride: 93 mEq/L — ABNORMAL LOW (ref 96–112)
Chloride: 93 mEq/L — ABNORMAL LOW (ref 96–112)
Chloride: 93 mEq/L — ABNORMAL LOW (ref 96–112)
Chloride: 95 mEq/L — ABNORMAL LOW (ref 96–112)
Chloride: 96 mEq/L (ref 96–112)
Creatinine, Ser: 0.87 mg/dL (ref 0.50–1.35)
Creatinine, Ser: 0.78 mg/dL (ref 0.50–1.35)
Creatinine, Ser: 0.64 mg/dL (ref 0.50–1.35)
Creatinine, Ser: 0.66 mg/dL (ref 0.50–1.35)
Creatinine, Ser: 0.67 mg/dL (ref 0.50–1.35)
Creatinine, Ser: 0.58 mg/dL (ref 0.50–1.35)
Creatinine, Ser: 0.59 mg/dL (ref 0.50–1.35)
GFR calc Af Amer: >90 mL/min (ref >90)
GFR calc Af Amer: >90 mL/min (ref >90)
GFR calc Af Amer: >90 mL/min (ref >90)
GFR calc Af Amer: >90 mL/min (ref >90)
GFR calc Af Amer: >90 mL/min (ref >90)
GFR calc Af Amer: >90 mL/min (ref >90)
GFR calc Af Amer: >90 mL/min (ref >90)
GFR calc Af Amer: >90 mL/min (ref >90)
GFR calc Af Amer: >90 mL/min (ref >90)
GFR calc non Af Amer: >90 mL/min (ref >90)
GFR calc non Af Amer: >90 mL/min (ref >90)
GFR calc non Af Amer: >90 mL/min (ref >90)
GFR calc non Af Amer: >90 mL/min (ref >90)
GFR calc non Af Amer: >90 mL/min (ref >90)
GFR calc non Af Amer: >90 mL/min (ref >90)
GFR calc non Af Amer: >90 mL/min (ref >90)
GLUCOSE: 152 mg/dL — AB (ref 70–99)
GLUCOSE: 154 mg/dL — AB (ref 70–99)
GLUCOSE: 167 mg/dL — AB (ref 70–99)
GLUCOSE: 178 mg/dL — AB (ref 70–99)
GLUCOSE: 89 mg/dL (ref 70–99)
GLUCOSE: 192 mg/dL — AB (ref 70–99)
Glucose, Bld: 148 mg/dL — ABNORMAL HIGH (ref 70–99)
Glucose, Bld: 155 mg/dL — ABNORMAL HIGH (ref 70–99)
Glucose, Bld: 169 mg/dL — ABNORMAL HIGH (ref 70–99)
Glucose, Bld: 161 mg/dL — ABNORMAL HIGH (ref 70–99)
Glucose, Bld: 183 mg/dL — ABNORMAL HIGH (ref 70–99)
Glucose, Bld: 166 mg/dL — ABNORMAL HIGH (ref 70–99)
Glucose, Bld: 265 mg/dL — ABNORMAL HIGH (ref 70–99)
POTASSIUM: 3.5 meq/L — AB (ref 3.7–5.3)
POTASSIUM: 3.4 meq/L — AB (ref 3.7–5.3)
POTASSIUM: 2.9 meq/L — AB (ref 3.7–5.3)
POTASSIUM: 3.8 meq/L (ref 3.7–5.3)
Potassium: 3.7 mEq/L (ref 3.7–5.3)
Potassium: 3.5 mEq/L — ABNORMAL LOW (ref 3.7–5.3)
Potassium: 3.4 mEq/L — ABNORMAL LOW (ref 3.7–5.3)
Potassium: 3.6 mEq/L — ABNORMAL LOW (ref 3.7–5.3)
Potassium: 3.3 mEq/L — ABNORMAL LOW (ref 3.7–5.3)
Potassium: 3.2 mEq/L — ABNORMAL LOW (ref 3.7–5.3)
Potassium: 3.3 mEq/L — ABNORMAL LOW (ref 3.7–5.3)
Potassium: 3.1 mEq/L — ABNORMAL LOW (ref 3.7–5.3)
Potassium: 3 mEq/L — ABNORMAL LOW (ref 3.7–5.3)
SODIUM: 136 meq/L — AB (ref 137–147)
SODIUM: 135 meq/L — AB (ref 137–147)
SODIUM: 133 meq/L — AB (ref 137–147)
Sodium: 133 mEq/L — ABNORMAL LOW (ref 137–147)
Sodium: 134 mEq/L — ABNORMAL LOW (ref 137–147)
Sodium: 135 mEq/L — ABNORMAL LOW (ref 137–147)
Sodium: 135 mEq/L — ABNORMAL LOW (ref 137–147)
Sodium: 136 mEq/L — ABNORMAL LOW (ref 137–147)
Sodium: 136 mEq/L — ABNORMAL LOW (ref 137–147)
Sodium: 136 mEq/L — ABNORMAL LOW (ref 137–147)
Sodium: 136 mEq/L — ABNORMAL LOW (ref 137–147)
Sodium: 138 mEq/L (ref 137–147)
Sodium: 139 mEq/L (ref 137–147)

## 2013-06-16 LAB — GLUCOSE, CAPILLARY
GLUCOSE-CAPILLARY: 148 mg/dL — AB (ref 70–99)
GLUCOSE-CAPILLARY: 151 mg/dL — AB (ref 70–99)
GLUCOSE-CAPILLARY: 151 mg/dL — AB (ref 70–99)
GLUCOSE-CAPILLARY: 146 mg/dL — AB (ref 70–99)
GLUCOSE-CAPILLARY: 155 mg/dL — AB (ref 70–99)
GLUCOSE-CAPILLARY: 152 mg/dL — AB (ref 70–99)
Glucose-Capillary: 133 mg/dL — ABNORMAL HIGH (ref 70–99)
Glucose-Capillary: 164 mg/dL — ABNORMAL HIGH (ref 70–99)
Glucose-Capillary: 152 mg/dL — ABNORMAL HIGH (ref 70–99)
Glucose-Capillary: 152 mg/dL — ABNORMAL HIGH (ref 70–99)
Glucose-Capillary: 167 mg/dL — ABNORMAL HIGH (ref 70–99)
Glucose-Capillary: 141 mg/dL — ABNORMAL HIGH (ref 70–99)
Glucose-Capillary: 196 mg/dL — ABNORMAL HIGH (ref 70–99)
Glucose-Capillary: 185 mg/dL — ABNORMAL HIGH (ref 70–99)
Glucose-Capillary: 97 mg/dL (ref 70–99)

## 2013-06-16 LAB — CBC
HCT: 37.5 % — ABNORMAL LOW (ref 39.0–52.0)
Hemoglobin: 13.1 g/dL (ref 13.0–17.0)
MCH: 31.5 pg (ref 26.0–34.0)
MCHC: 34.9 g/dL (ref 30.0–36.0)
MCV: 90.1 fL (ref 78.0–100.0)
PLATELETS: 359 10*3/uL (ref 150–400)
RBC: 4.16 MIL/uL — AB (ref 4.22–5.81)
RDW: 13.2 % (ref 11.5–15.5)
WBC: 19 10*3/uL — AB (ref 4.0–10.5)

## 2013-06-16 LAB — HEMOGLOBIN A1C
HEMOGLOBIN A1C: 11.7 % — AB (ref <5.7)
Mean Plasma Glucose: 289 mg/dL — ABNORMAL HIGH (ref <117)

## 2013-06-16 LAB — ETHANOL

## 2013-06-16 MED ORDER — HYDROMORPHONE HCL PF 1 MG/ML IJ SOLN
1.0000 mg | Freq: Four times a day (QID) | INTRAMUSCULAR | Status: DC | PRN
  Administered 2013-06-16 – 2013-06-18 (×9): 1 mg via INTRAVENOUS
  Filled 2013-06-16 (×10): qty 1

## 2013-06-16 MED ORDER — LORAZEPAM 2 MG/ML IJ SOLN
1.0000 mg | Freq: Two times a day (BID) | INTRAMUSCULAR | Status: DC | PRN
  Administered 2013-06-16 – 2013-06-18 (×4): 1 mg via INTRAVENOUS
  Filled 2013-06-16 (×4): qty 1

## 2013-06-16 MED ORDER — POTASSIUM CHLORIDE 20 MEQ/15ML (10%) PO LIQD
40.0000 meq | Freq: Once | ORAL | Status: AC
  Administered 2013-06-16: 40 meq via ORAL
  Filled 2013-06-16: qty 30

## 2013-06-16 MED ORDER — POTASSIUM CHLORIDE IN NACL 40-0.9 MEQ/L-% IV SOLN
INTRAVENOUS | Status: AC
Start: 2013-06-16 — End: 2013-06-16
  Filled 2013-06-16: qty 1000

## 2013-06-16 MED ORDER — POTASSIUM CHLORIDE 20 MEQ/15ML (10%) PO LIQD: 40.0000 meq | Freq: Once | ORAL | Status: DC

## 2013-06-16 MED ORDER — INSULIN ASPART PROT & ASPART (70-30 MIX) 100 UNIT/ML ~~LOC~~ SUSP
SUBCUTANEOUS | Status: AC
  Filled 2013-06-16: qty 10

## 2013-06-16 MED ORDER — POTASSIUM CHLORIDE CRYS ER 20 MEQ PO TBCR: 40.0000 meq | EXTENDED_RELEASE_TABLET | Freq: Once | ORAL | Status: DC

## 2013-06-16 MED ORDER — INSULIN ASPART 100 UNIT/ML ~~LOC~~ SOLN
0.0000 [IU] | Freq: Three times a day (TID) | SUBCUTANEOUS | Status: DC
  Administered 2013-06-17 – 2013-06-18 (×3): 2 [IU] via SUBCUTANEOUS
  Administered 2013-06-18: 1 [IU] via SUBCUTANEOUS

## 2013-06-16 MED ORDER — SODIUM CHLORIDE 0.9 % IV SOLN
INTRAVENOUS | Status: DC
  Administered 2013-06-16: 19:00:00 via INTRAVENOUS
  Filled 2013-06-16 (×2): qty 1000

## 2013-06-16 MED ORDER — METOCLOPRAMIDE HCL 5 MG/ML IJ SOLN
5.0000 mg | Freq: Three times a day (TID) | INTRAMUSCULAR | Status: DC
  Administered 2013-06-16 – 2013-06-17 (×3): 5 mg via INTRAVENOUS
  Filled 2013-06-16 (×3): qty 2

## 2013-06-16 MED ORDER — PANTOPRAZOLE SODIUM 40 MG IV SOLR
40.0000 mg | Freq: Two times a day (BID) | INTRAVENOUS | Status: DC
  Administered 2013-06-16 (×2): 40 mg via INTRAVENOUS
  Filled 2013-06-16 (×2): qty 40

## 2013-06-16 MED ORDER — INSULIN ASPART PROT & ASPART (70-30 MIX) 100 UNIT/ML ~~LOC~~ SUSP
20.0000 [IU] | Freq: Two times a day (BID) | SUBCUTANEOUS | Status: DC
  Administered 2013-06-17 – 2013-06-18 (×3): 20 [IU] via SUBCUTANEOUS
  Filled 2013-06-16: qty 10

## 2013-06-16 MED ORDER — POTASSIUM CHLORIDE 10 MEQ/100ML IV SOLN
10.0000 meq | INTRAVENOUS | Status: AC
  Administered 2013-06-16 (×3): 10 meq via INTRAVENOUS
  Filled 2013-06-16 (×3): qty 100

## 2013-06-16 NOTE — Progress Notes (Addendum)
[facility] PROGRESS NOTE  Brent Daniels [id number] DOB: 1985/04/26 DOA: 06/15/2013 PCP: Celedonio Savage, MD  Assessment/Plan: 1-DKA: appears to be secondary to non-compliance/lack of insulin on current hypoglycemic regimen -follow A1C -Anion gap still around 19; will continue insulin drip -continue NPO status, IVF's and supportive care -follow electrolytes and replete them as needed  2-nausea/vomiting: due to gastroparesis most likely and DKA. -continue PRN antiemetics -start reglan  3-GERD and hematemesis: most likely gastritis/esophagitis and mallory-weiss -will use IV protonis -GI has been consulted, will rec's  4-leukocytosis: no signs of infection appreciated. Most likely stress demargination with dehydration and DKA -will follow trend  5-chronic back pain: continue PRN pain meds  6-HTN:will continue PRN hydralazine for now given NPO status. -once full hydrated and tolerating PO's will resume lisinopril  DVT: SCD's  Code Status: Full Family Communication: no family at bedside Disposition Plan: home when medically stable   Consultants:  GI  Procedures:  See below for x-ray reports  Antibiotics:  None   HPI/Subjective: Complaining of N/V and epigastric pain. No fever, no CP. Patient with episode of coffee ground emesis after multiple episodes of vomiting.  Objective: Filed Vitals:   06/16/13 0831  BP:   Pulse:   Temp: 98.2 F (36.8 C)  Resp:     Intake/Output Summary (Last 24 hours) at 06/16/13 1112 Last data filed at 06/16/13 0600  Gross per 24 hour  Intake 2026.12 ml  Output   2150 ml  Net -123.88 ml   Filed Weights   06/15/13 1442 06/15/13 1738 06/16/13 0453  Weight: 81.647 kg (180 lb) 78 kg (171 lb 15.3 oz) 76.8 kg (169 lb 5 oz)    Exam:   General:  Afebrile, mild distress secondary to N/V; denies CP; but endorses abd pain  Cardiovascular: mild tachycardic, no rubs, gallops or murmurs   Respiratory: CTA  bilaterally  Abdomen: soft, ND, positive BS; tender to palpation in epigastric area  Musculoskeletal: no edema, no cyanosis  Data Reviewed: Basic Metabolic Panel:  Recent Labs Lab 06/15/13 1735  06/16/13 0128 06/16/13 0256 06/16/13 0507 06/16/13 0713 06/16/13 0917  NA 134*  < > 135* 136* 139 138 136*  K 4.1  < > 3.5* 3.5* 3.4* 3.6* 3.3*  CL 84*  < > 92* 93* 96 95* 92*  CO2 14*  < > 23 23 24 24 26   GLUCOSE 569*  < > 148* 154* 155* 169* 178*  BUN 29*  < > 23 23 22 22 19   CREATININE 1.03  < > 0.78 0.78 0.69 0.68 0.68  CALCIUM 8.9  < > 8.9 8.9 9.0 9.0 8.9  MG 2.2  --   --   --   --   --   --   < > = values in this interval not displayed. Liver Function Tests:  Recent Labs Lab 06/15/13 1735  LIPASE 8*   CBC:  Recent Labs Lab 06/15/13 1512 06/16/13 1053  WBC 19.1* 19.0*  NEUTROABS 16.7*  --   HGB 15.6 13.1  HCT 45.7 37.5*  MCV 91.6 90.1  PLT 410* 359   CBG:  Recent Labs Lab 06/16/13 0452 06/16/13 0709 06/16/13 0806 06/16/13 0947 06/16/13 1109  GLUCAP 152* 152* 151* 167* 151*    Recent Results (from the past 240 hour(s))  MRSA PCR SCREENING     Status: None   Collection Time    06/15/13  5:27 PM      Result Value Ref Range Status   MRSA by PCR NEGATIVE  NEGATIVE Final   Comment:            The GeneXpert MRSA Assay (FDA     approved for NASAL specimens     only), is one component of a     comprehensive MRSA colonization     surveillance program. It is not     intended to diagnose MRSA     infection nor to guide or     monitor treatment for     MRSA infections.     Studies: Dg Chest Portable 1 View  06/15/2013   CLINICAL DATA:  Abdominal pain shortness of breath.  EXAM: PORTABLE CHEST - 1 VIEW  COMPARISON:  Single view of the chest 08/14/2011. PA and lateral chest 01/25/2011.  FINDINGS: The lungs are clear. Heart size is normal. There is no pneumothorax or pleural effusion.  IMPRESSION: Negative chest.   Electronically Signed   By: Inge Rise M.D.   On: 06/15/2013 15:39    Scheduled Meds: . antiseptic oral rinse  15 mL Mouth Rinse BID  . pantoprazole (PROTONIX) IV  40 mg Intravenous Q12H   Continuous Infusions: . sodium chloride    . sodium chloride Stopped (06/15/13 2313)  . dextrose 5 % and 0.45% NaCl    . dextrose 5 % and 0.45% NaCl 75 mL/hr at 06/16/13 0600  . insulin (NOVOLIN-R) infusion 3.7 Units/hr (06/16/13 0506)  . insulin (NOVOLIN-R) infusion      Principal Problem:   DKA (diabetic ketoacidoses) Active Problems:   HTN (hypertension)   Back pain   Uncontrolled diabetes mellitus   GERD (gastroesophageal reflux disease)   DKA, type 1    Time spent: >30 minutes    [facility] Pager [phone]. If 7PM-7AM, please contact night-coverage at [email or web address], password [facility] 06/16/2013, 11:12 AM  LOS: 1 day

## 2013-06-16 NOTE — Progress Notes (Signed)
Patient has removed Tele monitor, refused to wear. MD aware.

## 2013-06-16 NOTE — Progress Notes (Signed)
Critical Potassium of 2.9 called Midlevel provider notified by text page

## 2013-06-16 NOTE — Progress Notes (Signed)
Inpatient Diabetes Program Recommendations  AACE/ADA: New Consensus Statement on Inpatient Glycemic Control (2013)  Target Ranges:  Prepandial:   less than 140 mg/dL      Peak postprandial:   less than 180 mg/dL (1-2 hours)      Critically ill patients:  140 - 180 mg/dL   Results for Brent Daniels, Brent Daniels (MRN [id number]) as of 06/16/2013 14:16  Ref. Range 06/16/2013 01:08 06/16/2013 02:20 06/16/2013 03:52 06/16/2013 04:52 06/16/2013 07:09 06/16/2013 08:06 06/16/2013 09:47 06/16/2013 11:09 06/16/2013 12:05  Glucose-Capillary Latest Range: 70-99 mg/dL 133 (H) 148 (H) 164 (H) 152 (H) 152 (H) 151 (H) 167 (H) 151 (H) 141 (H)   Diabetes history: DM1 Outpatient Diabetes medications: Novolin 70/30 15 units in the morning, 70/30 25 units QHS, Novolin R sliding scale Current orders for Inpatient glycemic control: Novolin R via insulin drip per GlucoStabilizer under DKA order set  Inpatient Diabetes Program Recommendations Insulin - IV drip/GlucoStabilizer: At this time, patient remains on IV insulin per GlucoStabilizer under DKA order set. Labs drawn 06/16/13 @ 13:29 indicate CO2 29 and AG 14. Blood glucose has been in target range for over 12 hours. Note anion gap is not completely closed and MD wants to wait until AG closed before transitioning from IV insulin to SQ insulin. Insulin - Basal: At time of transition from IV insulin to SQ insulin, please consider ordering 70/30 20 units BID (based on 76.8 kg x 0.3 units and equivilant to Lantus 23 units) Correction (SSI): At time of transition from IV insulin to SQ insulin, please conisder ordering Novolog sensitive correction scale Q4H.  Note: Spoke with patient about diabetes and home regimen for diabetes control.  Patient was asleep upon entering the room and took several times of speaking to and touching shoulder to get him awake to talk. Patient states that he is followed by Dr. Ledora Daniels in [facility] for diabetes control.  Currently patient is taking Novolin  70/30 15 units in the morning, Novolin 70/30 25 units QHS, and Novolin R sliding scale for diabetes control.  Inquired if patient was able to get insulins as prescribed. Patient states that he has insurance but the copays for his insulin is expensive so he gets Novolin 70/30 and Novolin R from [facility] for $24.88 per vial.  He admits that he sometimes runs out of insulin and is not able to afford to get it.  Inquired about any prior attempt to apply for patient medication assistance through insulin manufactures.  Patient reports that he has applied before but was denied due to a prior drug charge on his record.  Author is not aware if insulin manufacture considers criminal record and no such information noted to be collected in reviewing current applications for Brent Daniels, Brent Daniels, and Brent Daniels patient Copy. Encouraged patient to check into insulin manufacture medication assistance programs again.  Inquired about most current A1C results and patient states that his last A1C was in the 13% range. Discussed what an A1C is and importance of getting A1C down and toward goal of <7.0%.  When asked if patient checks his blood sugar at home he states that he checks his blood sugar several times a day.  However during our conversation patient reports that he is out of testing strips.  Patient states that he has the Reli-On glucometer and gets his test strips from [facility] which is the most affordable brand of meter and testing supplies ($17 for Reli-On glucometer and $9 for 50 Reli-On test strips). Stressed importance of  checking CBGs and maintaining good CBG control to prevent long-term and short-term complications. Discussed impact of nutrition, exercise, stress, sickness, and medication compliance on diabetes control. Patient reports that he knows what he needs to do but is limited by financial barriers in being able to afford medications and supplies for diabetes management. Discussed community  resources (health department, free clinic, social services to apply for Medicaid) with the patient and he reports that he has been denied by Medicaid in the past. Encouraged patient to reapply for assistance through social services and health department.  Patient verbalized understanding of information discussed and states that he does not have any other questions related to diabetes at this time. Discussed patient situation with Tammy, RN, Case Manager and Brent Daniels, Therapist, sports. Will continue to follow while inpatient.  Thanks, Brent Alderman, RN, MSN, CCRN Diabetes Coordinator Inpatient Diabetes Program [phone] (Team Pager) [phone] ([facility] office) [phone] [name] office)

## 2013-06-16 NOTE — Progress Notes (Signed)
Patient is refusing to take potassium. Says it makes him throw up. Will not take iv or po route does not  Want any more dosages. Currently has NS with 45meq of potassium through main IV fluids. Will continue to monitor potassium levels through q2h bmets. Importance of potassium replacement explained to patient and education on electrolyte levels and the effects on the body completed.

## 2013-06-16 NOTE — Progress Notes (Signed)
Patient had and critical 12 lead EKG this AM with a Long QTc.  Patient has had several emesis episodes and is now hiccupping.  Patient did emesis blood with his last episode.  MD call and informed of his increased confusion as well.  GI consult order since the patient has a history of multiple gastric ulcers.

## 2013-06-16 NOTE — Consult Note (Addendum)
Referring Provider: Dr. Darrick Meigs Primary Care Physician:  Celedonio Savage, MD Primary Gastroenterologist:  Dr. [name]   Date of Admission: 06/15/13 Date of Consultation: 06/16/13  Reason for Consultation:  Hematemesis, possible history of gastric ulcers  HPI:  Brent Daniels is a 28 year old male with a history of uncontrolled Type 1 diabetes, history of DKA, GERD, presenting to the ED with persistent nausea and vomiting. Admitted with DKA. Several episodes of emesis this morning with blood in last episode of emesis. GI consulted for further evaluation.  States diffuse abdominal pain prior to presentation. Multiple episodes of nausea and vomiting. States he saw red blood yesterday, about a cupful. Another incidence early this morning. No melena. Pain 9/10. States legs/stomach/back/chest hurt. States he was told he had gastric ulcers a few years back. States his doctor in [place] told him. NO PRIOR EGD. No NSAIDs or aspirin powders since that time. Hx of GERD, takes Omeprazole BID. Doesn't seem to control it. No dysphagia. No ETOH use.   Past Medical History  Diagnosis Date  . Heart murmur   . Diabetes mellitus     type 1  . Arthritis   . Back pain   . GERD (gastroesophageal reflux disease)   . Multiple gastric ulcers     per patient, NO EGD PERFORMED IN PAST    Past Surgical History  Procedure Laterality Date  . Tympanostomy tube placement    . Knee arthroscopy  06/30/2011    Procedure: ARTHROSCOPY KNEE;  Surgeon: Carole Civil, MD;  Location: [facility];  Service: Orthopedics;  Laterality: Right;  diagnostic arthroscopy    Prior to Admission medications   Medication Sig Start Date End Date Taking? Authorizing Provider  gabapentin (NEURONTIN) 600 MG tablet Take 600 mg by mouth 3 (three) times daily.   Yes Historical Provider, MD  insulin NPH-regular Human (NOVOLIN 70/30) (70-30) 100 UNIT/ML injection Inject 15-25 Units into the skin 2 (two) times daily. Patient takes 15 units in the  morning and 25 units at bedtime   Yes Historical Provider, MD  insulin regular (HUMULIN R) 100 units/mL injection Use sliding scale 01/04/12  Yes Janice Norrie, MD  insulin regular (HUMULIN R,NOVOLIN R) 100 units/mL injection Inject into the skin as directed. Sliding Scale. Inject required units based on Blood Glucose levels up to 5 times daily with meals.   Yes Historical Provider, MD  lisinopril (PRINIVIL,ZESTRIL) 20 MG tablet Take 20 mg by mouth daily.   Yes Historical Provider, MD  traMADol (ULTRAM) 50 MG tablet Take 50 mg by mouth every 6 (six) hours as needed. pain   Yes Historical Provider, MD    Current Facility-Administered Medications  Medication Dose Route Frequency Provider Last Rate Last Dose  . 0.9 %  sodium chloride infusion   Intravenous Continuous Maudry Diego, MD      . 0.9 %  sodium chloride infusion   Intravenous Continuous Nishant Dhungel, MD      . antiseptic oral rinse (BIOTENE) solution 15 mL  15 mL Mouth Rinse BID Nishant Dhungel, MD   15 mL at 06/16/13 0829  . dextrose 5 %-0.45 % sodium chloride infusion   Intravenous Continuous Maudry Diego, MD      . dextrose 5 %-0.45 % sodium chloride infusion   Intravenous Continuous Nishant Dhungel, MD 75 mL/hr at 06/16/13 0600    . dextrose 50 % solution 25 mL  25 mL Intravenous PRN Maudry Diego, MD      . dextrose  50 % solution 25 mL  25 mL Intravenous PRN Nishant Dhungel, MD      . enoxaparin (LOVENOX) injection 40 mg  40 mg Subcutaneous Q24H Nishant Dhungel, MD      . famotidine (PEPCID) IVPB 20 mg  20 mg Intravenous Q12H Nishant Dhungel, MD   20 mg at 06/15/13 2150  . hydrALAZINE (APRESOLINE) injection 10 mg  10 mg Intravenous Q6H PRN Nishant Dhungel, MD      . HYDROmorphone (DILAUDID) injection 1 mg  1 mg Intravenous Q4H PRN Nishant Dhungel, MD   1 mg at 06/16/13 0826  . insulin regular (NOVOLIN R,HUMULIN R) 1 Units/mL in sodium chloride 0.9 % 100 mL infusion   Intravenous Continuous Maudry Diego, MD 3.7 mL/hr at  06/16/13 0506 3.7 Units/hr at 06/16/13 0506  . insulin regular (NOVOLIN R,HUMULIN R) 1 Units/mL in sodium chloride 0.9 % 100 mL infusion   Intravenous Continuous Nishant Dhungel, MD      . LORazepam (ATIVAN) injection 1 mg  1 mg Intravenous Q6H PRN Nishant Dhungel, MD   1 mg at 06/16/13 0826  . metoCLOPramide (REGLAN) injection 10 mg  10 mg Intravenous Q6H PRN Nishant Dhungel, MD   10 mg at 06/16/13 0459  . promethazine (PHENERGAN) injection 25 mg  25 mg Intravenous Q4H PRN Nishant Dhungel, MD   25 mg at 06/15/13 2336    Allergies as of 06/15/2013 - Review Complete 06/15/2013  Allergen Reaction Noted  . Ibuprofen Other (See Comments) 08/14/2011  . Naproxen Other (See Comments) 08/14/2011  . Sulfa antibiotics Other (See Comments) 08/14/2011  . Morphine and related Rash 01/04/2012    Family History  Problem Relation Age of Onset  . Cancer Father   . Alcohol abuse Father   . Pseudochol deficiency Neg Hx   . Malignant hyperthermia Neg Hx   . Hypotension Neg Hx   . Anesthesia problems Neg Hx   . Colon cancer Neg Hx     History   Social History  . Marital Status: Single    Spouse Name: N/A    Number of Children: N/A  . Years of Education: 12   Occupational History  . unemployed     trying to get disability   Social History Main Topics  . Smoking status: Current Every Day Smoker -- 0.50 packs/day for 10 years    Types: Cigarettes  . Smokeless tobacco: Former Systems developer    Quit date: 09/17/2000  . Alcohol Use: No  . Drug Use: No  . Sexual Activity: Not on file   Other Topics Concern  . Not on file   Social History Narrative  . No narrative on file    Review of Systems: Gen: see HPI CV: +chest pain Resp: Denies shortness of breath with rest, cough, wheezing GI: see HPI GU : Denies urinary burning, urinary frequency, urinary incontinence.  MS: back pain, leg pain  Derm: Denies rash, itching, dry skin Psych: Denies depression, anxiety,confusion, or memory loss Heme:  Denies bruising, bleeding, and enlarged lymph nodes.  Physical Exam: Vital signs in last 24 hours: Temp:  [98.2 F (36.8 C)-98.8 F (37.1 C)] 98.2 F (36.8 C) (05/11 0831) Pulse Rate:  [122-141] 138 (05/10 1738) Resp:  [13-37] 20 (05/11 0637) BP: (112-163)/(63-106) 159/93 mmHg (05/11 0637) SpO2:  [100 %] 100 % (05/10 1738) Weight:  [169 lb 5 oz (76.8 kg)-180 lb (81.647 kg)] 169 lb 5 oz (76.8 kg) (05/11 0453) Last BM Date: 06/15/13 General:   Alert,  Well-developed, well-nourished, appears  irritable. Short answers.  Head:  Normocephalic and atraumatic. Eyes:  Sclera clear, no icterus.    Ears:  Normal auditory acuity. Nose:  No deformity, discharge,  or lesions. Mouth:  No deformity or lesions, dentition normal. Neck:  Supple; no masses or thyromegaly. Lungs:  Clear throughout to auscultation.    Heart:  S1 S2 present, tachycardic Abdomen:  Soft, nondistended. Significantly TTP diffusely with only light touch. Out of proportion to physical exam. +BS.  Rectal:  Deferred  Msk:  Symmetrical without gross deformities. Normal posture. Extremities:  Without clubbing or edema. Neurologic:  Alert and  oriented x4;  grossly normal neurologically. Skin:  Intact without significant lesions or rashes. Cervical Nodes:  No significant cervical adenopathy. Psych:  Alert and cooperative but with short answers, appears irritable.   Intake/Output from previous day: 05/10 0701 - 05/11 0700 In: 2026.1 [P.O.:270; I.V.:1506.1; IV Piggyback:250] Out: 2150 [Urine:1500] Intake/Output this shift:    Lab Results:  Recent Labs  06/15/13 1512  WBC 19.1*  HGB 15.6  HCT 45.7  PLT 410*   BMET  Recent Labs  06/16/13 0256 06/16/13 0507 06/16/13 0713  NA 136* 139 138  K 3.5* 3.4* 3.6*  CL 93* 96 95*  CO2 23 24 24   GLUCOSE 154* 155* 169*  BUN 23 22 22   CREATININE 0.78 0.69 0.68  CALCIUM 8.9 9.0 9.0    Studies/Results: Dg Chest Portable 1 View  06/15/2013   CLINICAL DATA:  Abdominal  pain shortness of breath.  EXAM: PORTABLE CHEST - 1 VIEW  COMPARISON:  Single view of the chest 08/14/2011. PA and lateral chest 01/25/2011.  FINDINGS: The lungs are clear. Heart size is normal. There is no pneumothorax or pleural effusion.  IMPRESSION: Negative chest.   Electronically Signed   By: Inge Rise M.D.   On: 06/15/2013 15:39    Impression: 28 year old male admitted with DKA, hematemesis after multiple episodes of vomiting. Self-reported history of gastric ulcers, although no prior EGD has been performed to document this. Likely dealing with Mallory-Weiss tear, possible gastritis, esophagitis, PUD unable to be excluded. Denies use of NSAIDs or aspirin powders for several years. Needs EGD for further evaluation; however, patient is tachycardic with elevated BP, likely related to pain. Will order stat CBC. Need to hold Lovenox due to hematemesis. Discontinue Pepcid and start Protonix BID. Could consider EGD later this afternoon if tachycardia/BP improves.   Plan: CBC now Discontinue Lovenox and Pepcid Start Protonix IV BID Follow H/H, monitor for any further overt GI bleeding Supportive measures to include anti-emetics and pain control EGD possibly later this afternoon with Dr. [name]. Discussed risks and benefits in detail with patient, who states understanding.   Orvil Feil, ANP-BC [facility]     LOS: 1 day    06/16/2013, 8:36 AM   Attending note:  Patient seen and examined. Agree with above assessment and recommendations. Lovenox now held.  Patient has requested deep sedation for his EGD. I feel this is a good idea. We'll arrange deep sedation tomorrow. Patient understands Dr. Oneida Alar will perform.

## 2013-06-16 NOTE — Progress Notes (Signed)
UR chart review completed.  

## 2013-06-17 ENCOUNTER — Inpatient Hospital Stay (HOSPITAL_COMMUNITY): Payer: Managed Care, Other (non HMO) | Admitting: Anesthesiology

## 2013-06-17 ENCOUNTER — Encounter (HOSPITAL_COMMUNITY)
Admission: EM | Disposition: A | Discharge status: Home / Self Care | Payer: Self-pay | Source: Home / Self Care | Attending: Internal Medicine

## 2013-06-17 ENCOUNTER — Encounter (HOSPITAL_COMMUNITY): Payer: Self-pay | Admitting: *Deleted

## 2013-06-17 ENCOUNTER — Encounter (HOSPITAL_COMMUNITY): Payer: Managed Care, Other (non HMO) | Admitting: Anesthesiology

## 2013-06-17 DIAGNOSIS — K259 Gastric ulcer, unspecified as acute or chronic, without hemorrhage or perforation: Secondary | ICD-10-CM

## 2013-06-17 HISTORY — PX: ESOPHAGOGASTRODUODENOSCOPY (EGD) WITH PROPOFOL: SHX5813

## 2013-06-17 HISTORY — PX: BIOPSY: SHX5522

## 2013-06-17 LAB — GLUCOSE, CAPILLARY
GLUCOSE-CAPILLARY: 153 mg/dL — AB (ref 70–99)
GLUCOSE-CAPILLARY: 68 mg/dL — AB (ref 70–99)
GLUCOSE-CAPILLARY: 135 mg/dL — AB (ref 70–99)
Glucose-Capillary: 165 mg/dL — ABNORMAL HIGH (ref 70–99)
Glucose-Capillary: 75 mg/dL (ref 70–99)
Glucose-Capillary: 171 mg/dL — ABNORMAL HIGH (ref 70–99)

## 2013-06-17 LAB — BASIC METABOLIC PANEL
BUN: 10 mg/dL (ref 6–23)
CO2: 28 meq/L (ref 19–32)
Calcium: 9.1 mg/dL (ref 8.4–10.5)
Chloride: 96 mEq/L (ref 96–112)
Creatinine, Ser: 0.66 mg/dL (ref 0.50–1.35)
GFR calc Af Amer: >90 mL/min (ref >90)
GFR calc non Af Amer: >90 mL/min (ref >90)
Glucose, Bld: 217 mg/dL — ABNORMAL HIGH (ref 70–99)
POTASSIUM: 4.2 meq/L (ref 3.7–5.3)
SODIUM: 135 meq/L — AB (ref 137–147)

## 2013-06-17 LAB — KOH PREP

## 2013-06-17 LAB — BASIC METABOLIC PANEL WITH GFR
BUN: 10 mg/dL (ref 6–23)
CO2: 26 meq/L (ref 19–32)
Calcium: 9.2 mg/dL (ref 8.4–10.5)
Chloride: 90 meq/L — ABNORMAL LOW (ref 96–112)
Creatinine, Ser: 0.68 mg/dL (ref 0.50–1.35)
GFR calc Af Amer: >90 mL/min
GFR calc non Af Amer: >90 mL/min
Glucose, Bld: 322 mg/dL — ABNORMAL HIGH (ref 70–99)
Potassium: 4.4 meq/L (ref 3.7–5.3)
Sodium: 133 meq/L — ABNORMAL LOW (ref 137–147)

## 2013-06-17 SURGERY — ESOPHAGOGASTRODUODENOSCOPY (EGD) WITH PROPOFOL
Anesthesia: Monitor Anesthesia Care

## 2013-06-17 MED ORDER — GABAPENTIN 300 MG PO CAPS
600.0000 mg | ORAL_CAPSULE | Freq: Three times a day (TID) | ORAL | Status: DC
  Administered 2013-06-17 – 2013-06-18 (×5): 600 mg via ORAL
  Filled 2013-06-17 (×5): qty 2

## 2013-06-17 MED ORDER — MIDAZOLAM HCL 2 MG/2ML IJ SOLN
INTRAMUSCULAR | Status: AC
  Filled 2013-06-17: qty 2

## 2013-06-17 MED ORDER — MIDAZOLAM HCL 2 MG/2ML IJ SOLN
1.0000 mg | INTRAMUSCULAR | Status: DC | PRN
  Administered 2013-06-17 (×2): 2 mg via INTRAVENOUS
  Filled 2013-06-17: qty 2

## 2013-06-17 MED ORDER — PROPOFOL INFUSION 10 MG/ML OPTIME
INTRAVENOUS | Status: DC | PRN
  Administered 2013-06-17: 150 ug/kg/min via INTRAVENOUS
  Administered 2013-06-17: 200 ug/kg/min via INTRAVENOUS

## 2013-06-17 MED ORDER — TRAMADOL HCL 50 MG PO TABS
50.0000 mg | ORAL_TABLET | Freq: Four times a day (QID) | ORAL | Status: DC | PRN
  Administered 2013-06-17 (×2): 50 mg via ORAL
  Filled 2013-06-17 (×2): qty 1

## 2013-06-17 MED ORDER — PROPOFOL 10 MG/ML IV BOLUS
INTRAVENOUS | Status: AC
  Filled 2013-06-17: qty 20

## 2013-06-17 MED ORDER — GLYCOPYRROLATE 0.2 MG/ML IJ SOLN
INTRAMUSCULAR | Status: AC
  Filled 2013-06-17: qty 1

## 2013-06-17 MED ORDER — LACTATED RINGERS IV SOLN
INTRAVENOUS | Status: DC
  Administered 2013-06-17: 07:00:00 via INTRAVENOUS

## 2013-06-17 MED ORDER — PROPOFOL 10 MG/ML IV BOLUS
INTRAVENOUS | Status: DC | PRN
  Administered 2013-06-17: 20 mg via INTRAVENOUS

## 2013-06-17 MED ORDER — FENTANYL CITRATE 0.05 MG/ML IJ SOLN
INTRAMUSCULAR | Status: AC
  Filled 2013-06-17: qty 2

## 2013-06-17 MED ORDER — STERILE WATER FOR IRRIGATION IR SOLN
Status: DC | PRN
  Administered 2013-06-17: 08:00:00

## 2013-06-17 MED ORDER — FENTANYL CITRATE 0.05 MG/ML IJ SOLN
INTRAMUSCULAR | Status: DC | PRN
  Administered 2013-06-17 (×4): 25 ug via INTRAVENOUS

## 2013-06-17 MED ORDER — FENTANYL CITRATE 0.05 MG/ML IJ SOLN
25.0000 ug | INTRAMUSCULAR | Status: AC
  Administered 2013-06-17 (×2): 25 ug via INTRAVENOUS

## 2013-06-17 MED ORDER — METOCLOPRAMIDE HCL 5 MG/ML IJ SOLN
5.0000 mg | Freq: Three times a day (TID) | INTRAMUSCULAR | Status: DC
  Administered 2013-06-17 – 2013-06-18 (×4): 5 mg via INTRAVENOUS
  Filled 2013-06-17 (×4): qty 2

## 2013-06-17 MED ORDER — SUCRALFATE 1 GM/10ML PO SUSP
1.0000 g | Freq: Three times a day (TID) | ORAL | Status: DC
  Administered 2013-06-17 – 2013-06-18 (×5): 1 g via ORAL
  Filled 2013-06-17 (×5): qty 10

## 2013-06-17 MED ORDER — ONDANSETRON HCL 4 MG/2ML IJ SOLN
4.0000 mg | Freq: Once | INTRAMUSCULAR | Status: AC
  Administered 2013-06-17: 4 mg via INTRAVENOUS

## 2013-06-17 MED ORDER — LIDOCAINE HCL (PF) 1 % IJ SOLN
INTRAMUSCULAR | Status: AC
  Filled 2013-06-17: qty 5

## 2013-06-17 MED ORDER — SODIUM CHLORIDE 0.9 % IV SOLN
INTRAVENOUS | Status: DC
  Administered 2013-06-17: 10:00:00 via INTRAVENOUS

## 2013-06-17 MED ORDER — ONDANSETRON HCL 4 MG/2ML IJ SOLN
INTRAMUSCULAR | Status: AC
  Filled 2013-06-17: qty 2

## 2013-06-17 MED ORDER — FENTANYL CITRATE 0.05 MG/ML IJ SOLN: 25.0000 ug | INTRAMUSCULAR | Status: DC | PRN

## 2013-06-17 MED ORDER — SODIUM CHLORIDE 0.9 % IV SOLN: INTRAVENOUS | Status: DC

## 2013-06-17 MED ORDER — ONDANSETRON HCL 4 MG/2ML IJ SOLN: 4.0000 mg | Freq: Once | INTRAMUSCULAR | Status: DC | PRN

## 2013-06-17 MED ORDER — POTASSIUM CHLORIDE IN NACL 40-0.9 MEQ/L-% IV SOLN
INTRAVENOUS | Status: DC
  Administered 2013-06-17: 09:00:00 via INTRAVENOUS

## 2013-06-17 MED ORDER — GLYCOPYRROLATE 0.2 MG/ML IJ SOLN
0.2000 mg | Freq: Once | INTRAMUSCULAR | Status: AC
  Administered 2013-06-17: 0.2 mg via INTRAVENOUS

## 2013-06-17 MED ORDER — PANTOPRAZOLE SODIUM 40 MG PO TBEC
40.0000 mg | DELAYED_RELEASE_TABLET | Freq: Two times a day (BID) | ORAL | Status: DC
  Administered 2013-06-17 – 2013-06-18 (×2): 40 mg via ORAL
  Filled 2013-06-17 (×2): qty 1

## 2013-06-17 SURGICAL SUPPLY — 13 items
BLOCK BITE 60FR ADLT L/F BLUE (MISCELLANEOUS) ×2 IMPLANT
BRUSH CYTO GASTROINTESTINAL (MISCELLANEOUS) ×2 IMPLANT
FLOOR PAD 36X40 (MISCELLANEOUS) ×3
FORCEPS BIOP RAD 4 LRG CAP 4 (CUTTING FORCEPS) ×2 IMPLANT
FORMALIN 10 PREFIL 20ML (MISCELLANEOUS) ×2 IMPLANT
KIT CLEAN ENDO COMPLIANCE (KITS) ×2 IMPLANT
MANIFOLD NEPTUNE II (INSTRUMENTS) ×3 IMPLANT
PAD FLOOR 36X40 (MISCELLANEOUS) IMPLANT
SYR 50ML LL SCALE MARK (SYRINGE) ×2 IMPLANT
SYRINGE 10CC LL (SYRINGE) ×2 IMPLANT
TUBING ENDO SMARTCAP PENTAX (MISCELLANEOUS) ×2 IMPLANT
TUBING IRRIGATION ENDOGATOR (MISCELLANEOUS) ×2 IMPLANT
WATER STERILE IRR 1000ML POUR (IV SOLUTION) ×2 IMPLANT

## 2013-06-17 NOTE — Progress Notes (Signed)
Inpatient Diabetes Program Recommendations  AACE/ADA: New Consensus Statement on Inpatient Glycemic Control (2013)  Target Ranges:  Prepandial:   less than 140 mg/dL      Peak postprandial:   less than 180 mg/dL (1-2 hours)      Critically ill patients:  140 - 180 mg/dL   Results for Brent Daniels, Brent Daniels (MRN [id number]) as of 06/17/2013 12:59  Ref. Range 06/17/2013 06:42 06/17/2013 08:07 06/17/2013 11:26 06/17/2013 11:54  Glucose-Capillary Latest Range: 70-99 mg/dL 153 (H) 165 (H) 68 (L) 75   Diabetes history: DM1  Outpatient Diabetes medications: Novolin 70/30 15 units in the morning, 70/30 25 units QHS, Novolin R sliding scale  Current orders for Inpatient glycemic control:70/30 20 units BID, Novolog 0-9 units AC   Inpatient Diabetes Program Recommendations Insulin - Basal: Currently ordered 70/30 20 units BID. Noted Fasting glucose of 165 mg/dl and then 68 mg/dl at 11:26 am. Please consider decreasing am dose of 70/30 to 18 units in am.  Thanks, Barnie Alderman, RN, MSN, [place] Diabetes Coordinator Inpatient Diabetes Program [phone] (Team Pager) [phone] ([facility] office) [phone] [facility] office)

## 2013-06-17 NOTE — Op Note (Signed)
[facility] [address] [place], [address]   ENDOSCOPY PROCEDURE REPORT  PATIENT: Brent Daniels, Brent Daniels  MR#: [id number] BIRTHDATE: 1985-10-04 , 27  yrs. old GENDER: Male  ENDOSCOPIST: Barney Drain, MD REFERRED [id number] Wenda Overland, M.D.  PROCEDURE DATE: 06/17/2013 PROCEDURE:   EGD w/ biopsy INDICATIONS:Hematemesis.   ABDOMINAL PAIN. MEDICATIONS: MAC sedation, administered by CRNA TOPICAL ANESTHETIC:   Cetacaine Spray  DESCRIPTION OF PROCEDURE:     Physical exam was performed.  Informed consent was obtained from the patient after explaining the benefits, risks, and alternatives to the procedure.  The patient was connected to the monitor and placed in the left lateral position.  Continuous oxygen was provided by nasal cannula and IV medicine administered through an indwelling cannula.  After administration of sedation, the patients esophagus was intubated and the     endoscope was advanced under direct visualization to the second portion of the duodenum.  The scope was removed slowly by carefully examining the color, texture, anatomy, and integrity of the mucosa on the way out.  The patient was recovered in endoscopy and discharged home in satisfactory condition.   ESOPHAGUS: UES 17 CM FROM THE TEETH.  LINEAR EROSIONS & ERYTHEMA STARTING 29 CM FROM THE TEETH AND EXTENDING TO THE GE JUNCTION. BRUSH BIOPSIES OBTAINED TO EVALUATE FOR CANDIDA ESOPHAGITIS. STOMACH: TWO SHALLOW/LINEAR ULCERS WITH SUROUNDING ERYTHEMA/EDEMA IN THE FUNDUS.  COLD FORCEPS BIOPSIES OBTAINED.   Mild non-erosive gastritis (inflammation) was found in the gastric antrum.  Multiple biopsies were performed using cold forceps.   DUODENUM: The duodenal mucosa showed no abnormalities in the bulb and second portion of the duodenum.  COMPLICATIONS:   None  ENDOSCOPIC IMPRESSION: 1.   HEMATEMESIS DUE TO ESOPHAGITIS 2.   TWO GASTRIC ULCERS 3.   MILD ANTRAL gastritis  RECOMMENDATIONS: BID PPI FULL  LIQUID DIET. ADVANCE AS TOLERATED. AWAIT BIOPSY & KOH PREP. REGLAN QAC   REPEAT EXAM:   _______________________________ Lorrin MaisBarney Drain, MD 06/17/2013 11:10 AM

## 2013-06-17 NOTE — Anesthesia Postprocedure Evaluation (Signed)
  Anesthesia Post-op Note  Patient: Brent Daniels  Procedure(s) Performed: Procedure(s): ESOPHAGOGASTRODUODENOSCOPY (EGD) WITH PROPOFOL GASTRIC ULCER AND ANTRAL BIOPSIES; ESOPHAGEAL BRUSHING  Patient Location: PACU  Anesthesia Type:MAC  Level of Consciousness: awake and patient cooperative  Airway and Oxygen Therapy: Patient Spontanous Breathing  Post-op Pain: mild  Post-op Assessment: Post-op Vital signs reviewed, Patient's Cardiovascular Status Stable, Respiratory Function Stable, Patent Airway and No signs of Nausea or vomiting  Post-op Vital Signs: Reviewed and stable  Last Vitals:  Filed Vitals:   06/17/13 0804  BP: 121/72  Pulse: 115  Temp: 36.7 C  Resp: 14    Complications: No apparent anesthesia complications [id number] Glucose

## 2013-06-17 NOTE — Transfer of Care (Signed)
Immediate Anesthesia Transfer of Care Note  Patient: Brent Daniels  Procedure(s) Performed: Procedure(s): ESOPHAGOGASTRODUODENOSCOPY (EGD) WITH PROPOFOL GASTRIC ULCER AND ANTRAL BIOPSIES; ESOPHAGEAL BRUSHING  Patient Location: PACU  Anesthesia Type:MAC  Level of Consciousness: sedated and patient cooperative  Airway & Oxygen Therapy: Patient Spontanous Breathing  Post-op Assessment: Report given to PACU RN, Post -op Vital signs reviewed and stable and Patient moving all extremities  Post vital signs: Reviewed and stable  Complications: No apparent anesthesia complications

## 2013-06-17 NOTE — Anesthesia Procedure Notes (Signed)
Procedure Name: MAC Date/Time: 06/17/2013 7:31 AM Performed by: Vista Deck Pre-anesthesia Checklist: Patient identified, Emergency Drugs available, Suction available, Timeout performed and Patient being monitored Patient Re-evaluated:Patient Re-evaluated prior to inductionOxygen Delivery Method: Nasal Cannula    Procedure Name: MAC Date/Time: 06/17/2013 7:35 AM Performed by: Vista Deck Pre-anesthesia Checklist: Patient identified, Emergency Drugs available, Suction available, Timeout performed and Patient being monitored Patient Re-evaluated:Patient Re-evaluated prior to inductionOxygen Delivery Method: Non-rebreather mask

## 2013-06-17 NOTE — Progress Notes (Signed)
[facility] PROGRESS NOTE  Brent Daniels [id number] DOB: [birth year] DOA: 06/15/2013 PCP: Celedonio Savage, MD  Assessment/Plan: 1-type 1 diabetes with DKA on presentation: appears to be secondary to non-compliance/lack of insulin on current hypoglycemic regimen -A1C 11.7 (with mean plasma glucose of 289). -Anion gap closed (11), bicarbonate 28 and CBGs less than 200. -DKA is now resolved and the patient has been transitioned off insulin drip. -Follow electrolytes and replete them as needed -continue 70/30 20 BID and SSI; will follow CBG's and continue adjusting insulin as needed.  2-nausea/vomiting: due to gastroparesis, esophagitis, gastritis, gastric ulcer and DKA. -continue PRN antiemetics -Continue Reglan -Better now that the DKA has improved.  3-GERD and hematemesis: Secondary to gastric ulcer, severe esophagitis and gastritis -Status post EGD; no active bleeding appreciated. -Will advance diet to full liquid -Continue PPI twice a day and a start Carafate.  4-leukocytosis: no signs of infection appreciated. Most likely stress demargination with dehydration and DKA -will follow trend. -Patient remains afebrile.  5-chronic back pain and neuropathy: continue PRN pain meds and resumed Neurontin  6-HTN:will continue PRN hydralazine for now given NPO status. -once full hydrated and tolerating PO's will resume lisinopril  7-Hypokalemia: repleted.  DVT: SCD's  Code Status: Full Family Communication: no family at bedside Disposition Plan: home when medically stable   Consultants:  GI  Procedures:  See below for x-ray reports  EGD (06/17/2013): Severe esophagitis, gastric ulcer and positive gastritis.  Antibiotics:  None   HPI/Subjective: Afebrile, NAD; still complaining of in upper abdomen area and on his legs; DKA is not resolved. Patient is status post endoscopy (no complications).  Objective: Filed Vitals:   06/17/13 0841  BP: 131/87  Pulse:   Temp:  98.1 F (36.7 C)  Resp:     Intake/Output Summary (Last 24 hours) at 06/17/13 1009 Last data filed at 06/17/13 0805  Gross per 24 hour  Intake 1951.72 ml  Output   1800 ml  Net 151.72 ml   Filed Weights   06/15/13 1738 06/16/13 0453 06/17/13 0607  Weight: 78 kg (171 lb 15.3 oz) 76.8 kg (169 lb 5 oz) 78.8 kg (173 lb 11.6 oz)    Exam:   General:  Afebrile, NAD; still complaining of in upper abdomen area and on his legs  Cardiovascular: mild tachycardic, no rubs, gallops or murmurs   Respiratory: CTA bilaterally  Abdomen: soft, ND, positive BS; patient reports soreness with deep palpation in upper abdomen area.  Musculoskeletal: no edema, no cyanosis  Data Reviewed: Basic Metabolic Panel:  Recent Labs Lab 06/15/13 1735  06/16/13 1905 06/16/13 2100 06/16/13 2310 06/17/13 0116 06/17/13 0557  NA 134*  < > 133* 135* 133* 133* 135*  K 4.1  < > 2.9* 3.0* 3.8 4.4 4.2  CL 84*  < > 93* 94* 92* 90* 96  CO2 14*  < > 30 31 26 26 28   GLUCOSE 569*  < > 166* 89 265* 322* 217*  BUN 29*  < > 11 10 9 10 10   CREATININE 1.03  < > 0.58 0.59 0.62 0.68 0.66  CALCIUM 8.9  < > 8.9 9.0 9.0 9.2 9.1  MG 2.2  --   --   --   --   --   --   < > = values in this interval not displayed. Liver Function Tests:  Recent Labs Lab 06/15/13 1735  LIPASE 8*   CBC:  Recent Labs Lab 06/15/13 1512 06/16/13 1053  WBC 19.1* 19.0*  NEUTROABS 16.7*  --  HGB 15.6 13.1  HCT 45.7 37.5*  MCV 91.6 90.1  PLT 410* 359   CBG:  Recent Labs Lab 06/16/13 1626 06/16/13 1736 06/16/13 2145 06/17/13 0642 06/17/13 0807  GLUCAP 196* 185* 97 153* 165*    Recent Results (from the past 240 hour(s))  MRSA PCR SCREENING     Status: None   Collection Time    06/15/13  5:27 PM      Result Value Ref Range Status   MRSA by PCR NEGATIVE  NEGATIVE Final   Comment:            The GeneXpert MRSA Assay (FDA     approved for NASAL specimens     only), is one component of a     comprehensive MRSA  colonization     surveillance program. It is not     intended to diagnose MRSA     infection nor to guide or     monitor treatment for     MRSA infections.     Studies: Dg Chest Portable 1 View  06/15/2013   CLINICAL DATA:  Abdominal pain shortness of breath.  EXAM: PORTABLE CHEST - 1 VIEW  COMPARISON:  Single view of the chest 08/14/2011. PA and lateral chest 01/25/2011.  FINDINGS: The lungs are clear. Heart size is normal. There is no pneumothorax or pleural effusion.  IMPRESSION: Negative chest.   Electronically Signed   By: Inge Rise M.D.   On: 06/15/2013 15:39    Scheduled Meds: . antiseptic oral rinse  15 mL Mouth Rinse BID  . gabapentin  600 mg Oral TID  . insulin aspart  0-9 Units Subcutaneous TID WC  . insulin aspart protamine- aspart  20 Units Subcutaneous BID WC  . metoCLOPramide (REGLAN) injection  5 mg Intravenous TID AC  . pantoprazole  40 mg Oral BID AC  . potassium chloride  40 mEq Oral Once  . sucralfate  1 g Oral TID WC & HS   Continuous Infusions: . sodium chloride    . insulin (NOVOLIN-R) infusion Stopped (06/16/13 1925)    Principal Problem:   DKA (diabetic ketoacidoses) Active Problems:   HTN (hypertension)   Back pain   Uncontrolled diabetes mellitus   GERD (gastroesophageal reflux disease)   DKA, type 1    Time spent: >30 minutes    [facility] Pager [phone]. If 7PM-7AM, please contact night-coverage at [email or web address], password [email or web address] 06/17/2013, 10:09 AM  LOS: 2 days

## 2013-06-17 NOTE — Anesthesia Preprocedure Evaluation (Signed)
Anesthesia Evaluation  Patient identified by MRN, date of birth, ID band Patient awake    Reviewed: Allergy & Precautions, H&P , NPO status , Patient's Chart, lab work & pertinent test results  Airway Mallampati: III TM Distance: >3 FB Neck ROM: Full    Dental  (+) Teeth Intact   Pulmonary Current Smoker,  breath sounds clear to auscultation        Cardiovascular hypertension, Pt. on medications Rhythm:Regular Rate:Normal     Neuro/Psych    GI/Hepatic PUD, GERD-  ,  Endo/Other  diabetes, Poorly Controlled, Type 1, Insulin Dependent  Renal/GU      Musculoskeletal   Abdominal   Peds  Hematology   Anesthesia Other Findings   Reproductive/Obstetrics                           Anesthesia Physical Anesthesia Plan  ASA: III  Anesthesia Plan: MAC   Post-op Pain Management:    Induction: Intravenous  Airway Management Planned: Simple Face Mask  Additional Equipment:   Intra-op Plan:   Post-operative Plan:   Informed Consent: I have reviewed the patients History and Physical, chart, labs and discussed the procedure including the risks, benefits and alternatives for the proposed anesthesia with the patient or authorized representative who has indicated his/her understanding and acceptance.     Plan Discussed with:   Anesthesia Plan Comments:         Anesthesia Quick Evaluation

## 2013-06-17 NOTE — H&P (Signed)
Primary Care Physician:  Celedonio Savage, MD Primary Gastroenterologist:  Dr. Oneida Alar  Pre-Procedure History & Physical: HPI:  Brent Daniels is a 28 y.o. male here for HEMATEMESIS.  Past Medical History  Diagnosis Date  . Heart murmur   . Diabetes mellitus     type 1  . Arthritis   . Back pain   . GERD (gastroesophageal reflux disease)   . Multiple gastric ulcers     per patient, NO EGD PERFORMED IN PAST  . Hypertension     Past Surgical History  Procedure Laterality Date  . Tympanostomy tube placement    . Knee arthroscopy  06/30/2011    Procedure: ARTHROSCOPY KNEE;  Surgeon: Carole Civil, MD;  Location: [facility];  Service: Orthopedics;  Laterality: Right;  diagnostic arthroscopy    Prior to Admission medications   Medication Sig Start Date End Date Taking? Authorizing Provider  gabapentin (NEURONTIN) 600 MG tablet Take 600 mg by mouth 3 (three) times daily.   Yes Historical Provider, MD  insulin NPH-regular Human (NOVOLIN 70/30) (70-30) 100 UNIT/ML injection Inject 15-25 Units into the skin 2 (two) times daily. Patient takes 15 units in the morning and 25 units at bedtime   Yes Historical Provider, MD  insulin regular (HUMULIN R) 100 units/mL injection Use sliding scale 01/04/12  Yes Janice Norrie, MD  insulin regular (HUMULIN R,NOVOLIN R) 100 units/mL injection Inject into the skin as directed. Sliding Scale. Inject required units based on Blood Glucose levels up to 5 times daily with meals.   Yes Historical Provider, MD  lisinopril (PRINIVIL,ZESTRIL) 20 MG tablet Take 20 mg by mouth daily.   Yes Historical Provider, MD  traMADol (ULTRAM) 50 MG tablet Take 50 mg by mouth every 6 (six) hours as needed. pain   Yes Historical Provider, MD    Allergies as of 06/15/2013 - Review Complete 06/15/2013  Allergen Reaction Noted  . Ibuprofen Other (See Comments) 08/14/2011  . Naproxen Other (See Comments) 08/14/2011  . Sulfa antibiotics Other (See Comments) 08/14/2011  . Morphine and  related Rash 01/04/2012    Family History  Problem Relation Age of Onset  . Cancer Father   . Alcohol abuse Father   . Pseudochol deficiency Neg Hx   . Malignant hyperthermia Neg Hx   . Hypotension Neg Hx   . Anesthesia problems Neg Hx   . Colon cancer Neg Hx     History   Social History  . Marital Status: Single    Spouse Name: N/A    Number of Children: N/A  . Years of Education: 12   Occupational History  . unemployed     trying to get disability   Social History Main Topics  . Smoking status: Current Every Day Smoker -- 0.50 packs/day for 10 years    Types: Cigarettes  . Smokeless tobacco: Former Systems developer    Quit date: 09/17/2000  . Alcohol Use: No  . Drug Use: No  . Sexual Activity: Not on file   Other Topics Concern  . Not on file   Social History Narrative  . No narrative on file    Review of Systems: See HPI, otherwise negative ROS   Physical Exam: BP 132/86  Pulse 138  Temp(Src) 98.4 F (36.9 C) (Oral)  Resp 26  Ht 6\' 2"  (1.88 m)  Wt 173 lb 11.6 oz (78.8 kg)  BMI 22.30 kg/m2  SpO2 100% General:   Alert,  pleasant and cooperative in NAD Head:  Normocephalic and atraumatic.  Neck:  Supple; Lungs:  Clear throughout to auscultation.    Heart:  Regular rate and rhythm. Abdomen:  Soft, nontender and nondistended. Normal bowel sounds, without guarding, and without rebound.   Neurologic:  Alert and  oriented x4;  grossly normal neurologically.  Impression/Plan:     HEMATEMESIS  PLAN:  EGD TODAY

## 2013-06-18 ENCOUNTER — Encounter (HOSPITAL_COMMUNITY): Payer: Self-pay | Admitting: Gastroenterology

## 2013-06-18 ENCOUNTER — Encounter: Payer: Self-pay | Admitting: Gastroenterology

## 2013-06-18 DIAGNOSIS — E119 Type 2 diabetes mellitus without complications: Secondary | ICD-10-CM

## 2013-06-18 LAB — CBC
HEMATOCRIT: 32.5 % — AB (ref 39.0–52.0)
Hemoglobin: 10.9 g/dL — ABNORMAL LOW (ref 13.0–17.0)
MCH: 30.8 pg (ref 26.0–34.0)
MCHC: 33.5 g/dL (ref 30.0–36.0)
MCV: 91.8 fL (ref 78.0–100.0)
PLATELETS: 254 10*3/uL (ref 150–400)
RBC: 3.54 MIL/uL — ABNORMAL LOW (ref 4.22–5.81)
RDW: 12.8 % (ref 11.5–15.5)
WBC: 9.3 10*3/uL (ref 4.0–10.5)

## 2013-06-18 LAB — GLUCOSE, CAPILLARY
GLUCOSE-CAPILLARY: 125 mg/dL — AB (ref 70–99)
Glucose-Capillary: 155 mg/dL — ABNORMAL HIGH (ref 70–99)

## 2013-06-18 LAB — BASIC METABOLIC PANEL
BUN: 5 mg/dL — AB (ref 6–23)
CALCIUM: 8.6 mg/dL (ref 8.4–10.5)
CO2: 32 mEq/L (ref 19–32)
Chloride: 101 mEq/L (ref 96–112)
Creatinine, Ser: 0.76 mg/dL (ref 0.50–1.35)
GFR calc Af Amer: >90 mL/min (ref >90)
GFR calc non Af Amer: >90 mL/min (ref >90)
Glucose, Bld: 71 mg/dL (ref 70–99)
Potassium: 3 mEq/L — ABNORMAL LOW (ref 3.7–5.3)
Sodium: 141 mEq/L (ref 137–147)

## 2013-06-18 MED ORDER — PANTOPRAZOLE SODIUM 40 MG PO TBEC: 40.0000 mg | DELAYED_RELEASE_TABLET | Freq: Two times a day (BID) | ORAL | Status: DC

## 2013-06-18 MED ORDER — NICOTINE 21 MG/24HR TD PT24
21.0000 mg | MEDICATED_PATCH | Freq: Every day | TRANSDERMAL | Status: DC
  Administered 2013-06-18 (×2): 21 mg via TRANSDERMAL
  Filled 2013-06-18 (×2): qty 1

## 2013-06-18 MED ORDER — OXYCODONE HCL 5 MG PO TABS: 5.0000 mg | ORAL_TABLET | Freq: Three times a day (TID) | ORAL | Status: DC | PRN

## 2013-06-18 MED ORDER — GABAPENTIN 600 MG PO TABS: 600.0000 mg | ORAL_TABLET | Freq: Three times a day (TID) | ORAL | Status: DC

## 2013-06-18 MED ORDER — FLUCONAZOLE 100 MG PO TABS
200.0000 mg | ORAL_TABLET | Freq: Once | ORAL | Status: AC
  Administered 2013-06-18: 200 mg via ORAL
  Filled 2013-06-18: qty 2

## 2013-06-18 MED ORDER — FLUCONAZOLE 100 MG PO TABS: 100.0000 mg | ORAL_TABLET | Freq: Every day | ORAL | Status: AC

## 2013-06-18 MED ORDER — FLUCONAZOLE 100 MG PO TABS: 100.0000 mg | ORAL_TABLET | Freq: Every day | ORAL | Status: DC

## 2013-06-18 MED ORDER — NICOTINE 21 MG/24HR TD PT24: 21.0000 mg | MEDICATED_PATCH | Freq: Every day | TRANSDERMAL | Status: DC

## 2013-06-18 MED ORDER — SUCRALFATE 1 GM/10ML PO SUSP: 1.0000 g | Freq: Three times a day (TID) | ORAL | Status: DC

## 2013-06-18 MED ORDER — INSULIN NPH ISOPHANE & REGULAR (70-30) 100 UNIT/ML ~~LOC~~ SUSP: 25.0000 [IU] | Freq: Two times a day (BID) | SUBCUTANEOUS | Status: DC

## 2013-06-18 NOTE — Discharge Instructions (Signed)
Gastroesophageal Reflux Disease, Adult  Gastroesophageal reflux disease (GERD) happens when acid from your stomach flows up into the esophagus. When acid comes in contact with the esophagus, the acid causes soreness (inflammation) in the esophagus. Over time, GERD may create small holes (ulcers) in the lining of the esophagus.  CAUSES   · Increased body weight. This puts pressure on the stomach, making acid rise from the stomach into the esophagus.  · Smoking. This increases acid production in the stomach.  · Drinking alcohol. This causes decreased pressure in the lower esophageal sphincter (valve or ring of muscle between the esophagus and stomach), allowing acid from the stomach into the esophagus.  · Late evening meals and a full stomach. This increases pressure and acid production in the stomach.  · A malformed lower esophageal sphincter.  Sometimes, no cause is found.  SYMPTOMS   · Burning pain in the lower part of the mid-chest behind the breastbone and in the mid-stomach area. This may occur twice a week or more often.  · Trouble swallowing.  · Sore throat.  · Dry cough.  · Asthma-like symptoms including chest tightness, shortness of breath, or wheezing.  DIAGNOSIS   Your caregiver may be able to diagnose GERD based on your symptoms. In some cases, X-rays and other tests may be done to check for complications or to check the condition of your stomach and esophagus.  TREATMENT   Your caregiver may recommend over-the-counter or prescription medicines to help decrease acid production. Ask your caregiver before starting or adding any new medicines.   HOME CARE INSTRUCTIONS   · Change the factors that you can control. Ask your caregiver for guidance concerning weight loss, quitting smoking, and alcohol consumption.  · Avoid foods and drinks that make your symptoms worse, such as:  · Caffeine or alcoholic drinks.  · Chocolate.  · Peppermint or mint flavorings.  · Garlic and onions.  · Spicy foods.  · Citrus fruits,  such as oranges, lemons, or limes.  · Tomato-based foods such as sauce, chili, salsa, and pizza.  · Fried and fatty foods.  · Avoid lying down for the 3 hours prior to your bedtime or prior to taking a nap.  · Eat small, frequent meals instead of large meals.  · Wear loose-fitting clothing. Do not wear anything tight around your waist that causes pressure on your stomach.  · Raise the head of your bed 6 to 8 inches with wood blocks to help you sleep. Extra pillows will not help.  · Only take over-the-counter or prescription medicines for pain, discomfort, or fever as directed by your caregiver.  · Do not take aspirin, ibuprofen, or other nonsteroidal anti-inflammatory drugs (NSAIDs).  SEEK IMMEDIATE MEDICAL CARE IF:   · You have pain in your arms, neck, jaw, teeth, or back.  · Your pain increases or changes in intensity or duration.  · You develop nausea, vomiting, or sweating (diaphoresis).  · You develop shortness of breath, or you faint.  · Your vomit is green, yellow, black, or looks like coffee grounds or blood.  · Your stool is red, bloody, or black.  These symptoms could be signs of other problems, such as heart disease, gastric bleeding, or esophageal bleeding.  MAKE SURE YOU:   · Understand these instructions.  · Will watch your condition.  · Will get help right away if you are not doing well or get worse.  Document Released: 11/02/2004 Document Revised: 04/17/2011 Document Reviewed: 08/12/2010  [employer]® Patient   Information ©2014 [employer].

## 2013-06-18 NOTE — Progress Notes (Signed)
    Subjective: Wants to go home. Would like to eat something. Mild abdominal discomfort/back/leg. No N/V, melena. States he took Ibuprofen sparingly as outpatient, "only if severe cases".   Objective: Vital signs in last 24 hours: Temp:  [97.7 F (36.5 C)-98.4 F (36.9 C)] 98 F (36.7 C) (05/13 0546) Pulse Rate:  [83-115] 83 (05/13 0546) Resp:  [14-18] 18 (05/13 0546) BP: (118-186)/(72-96) 138/83 mmHg (05/13 0546) SpO2:  [98 %-100 %] 99 % (05/13 0546) Last BM Date: 06/15/13 General:   Alert and oriented, pleasant Head:  Normocephalic and atraumatic. Eyes:  No icterus, sclera clear. Conjuctiva pink.  Abdomen:  Bowel sounds present, soft, non-tender, non-distended. No HSM or hernias noted. No rebound or guarding. No masses appreciated  Msk:  Symmetrical without gross deformities. Normal posture. Extremities:  Without edema. Neurologic:  Alert and  oriented x4;  grossly normal neurologically. Psych:  Alert and cooperative. Normal mood and affect.  Intake/Output from previous day: 05/12 0701 - 05/13 0700 In: 700 [I.V.:700] Out: 900 [Urine:900] Intake/Output this shift:    Lab Results:  Recent Labs  06/15/13 1512 06/16/13 1053 06/18/13 0446  WBC 19.1* 19.0* 9.3  HGB 15.6 13.1 10.9*  HCT 45.7 37.5* 32.5*  PLT 410* 359 254   BMET  Recent Labs  06/17/13 0116 06/17/13 0557 06/18/13 0446  NA 133* 135* 141  K 4.4 4.2 3.0*  CL 90* 96 101  CO2 26 28 32  GLUCOSE 322* 217* 71  BUN 10 10 5*  CREATININE 0.68 0.66 0.76  CALCIUM 9.2 9.1 8.6    Assessment: 28 year old male admitted with DKA, noted hematemesis after multiple episodes of vomiting, s/p EGD with candida esophagitis and 2 gastric ulcers, mild antral gastritis. Gastric biopsies pending but KOH prep positive. Clinically improved and appropriate for discharge home today. We will arrange follow-up as outpatient.    Plan: BID PPI Follow-up on gastric biopsies Full liquids, advance as tolerated Reglan with  meals Diflucan for candida esophagitis X 21 days; first loading dose now, then 100 mg daily thereafter Outpatient follow-up in our office in 2-3 months; arrange surveillance EGD at that time   Orvil Feil, ANP-BC [facility]    LOS: 3 days    06/18/2013, 7:41 AM  Attending note:  Agree with above. Patient needs to absolutely refrain from taking nonsteroidal agents

## 2013-06-18 NOTE — Progress Notes (Unsigned)
Patient needs outpatient follow-up with [place] in about 2 months. Need to set up surveillance EGD at that time.

## 2013-06-18 NOTE — Progress Notes (Signed)
Patient states understanding of discharge instructions, prescriptions given 

## 2013-06-18 NOTE — Discharge Summary (Signed)
Physician Discharge Summary  Brent Daniels [id number] DOB: Dec 16, 1985 DOA: 06/15/2013  PCP: Celedonio Savage, MD  Admit date: 06/15/2013 Discharge date: 06/18/2013  Time spent: >30 minutes  Recommendations for Outpatient Follow-up:  -Close follow up to CBG's and diabetes; adjust hypoglycemic regimen as needed and if necessary referred to endocrinologist. -reassess BP and adjust therapy if required -BMET to follow electrolytes and renal function -assist with smoking cessation -will need follow up with GI in approx 2 months -further evaluation/treatment of chronic pain; if needed referred patient to pain clinic -reassess symptoms of chronic abd pain after therapy for current gastritis/GERD/esophagitis is completed. If discomfort and symptoms persist he might need chronic treatment for gastroparesis.  Discharge Diagnoses:  DKA (diabetic ketoacidoses) Diabetes type 1, uncontrolled HTN (hypertension) Chronic Back pain and legs pain Gastritis/gastric ulcer Candida esophagitis Hematemesis GERD (gastroesophageal reflux disease) Tobacco abuse   Discharge Condition: stable and improved. Discharge home with instruction to quit smoking, take medications as prescribed and follow with PCP in approx 10 days.  Diet recommendation: heart healthy and low carbohydrates diet.  Filed Weights   06/15/13 1738 06/16/13 0453 06/17/13 0607  Weight: 78 kg (171 lb 15.3 oz) 76.8 kg (169 lb 5 oz) 78.8 kg (173 lb 11.6 oz)    History of present illness:  28 year old male with uncontrolled type 1 diabetes mellitus with history of DKA, GERD with hx 60f gastric ulcers and chr back pain, anxiety and depression, presented to the ED with persistent nausea and vomiting for past 2 days . Patient reports ongoing nausea and vomiting with inability to hold anything down. Also has polyuria and polydipsia for the same duration. He reports his symptoms to be similar to DKA that he had about 6 years back. His last hemoglobin  A1c was 13 and fingersticks at home range between 150-350. He informs taking his insulin regularly and tries to be compliant with his diet. Reports dizziness and generalized weakness. Also reports epigastric pain. He denies eating anything outside, sick contacts, fevers, chills, headache, blurred vision. Denies chest pain, palpitations, SOB or bowel symptoms. Denies change in weight or appetite.   Hospital Course:  1-type 1 diabetes with DKA on presentation: appears to be secondary to non-compliance/not enough insulin on current hypoglycemic regimen  -A1C 11.7 (with mean plasma glucose of 289).  -Anion gap closed (11), bicarbonate 28 and CBGs in the 150-180 range.  -DKA resolved at discharge -patient insulin regimen adjusted to 70/30 25 units BID and has been advised to follow with low carbohydrates diet -close follow up with PCP for further adjustments to his regimen base on CBG's trend.   2-nausea/vomiting: due to gastroparesis, esophagitis, gastritis, gastric ulcer and DKA.  -continue PRN antiemetics  -good response to reglan while inpatient -Better now that the DKA has improved. -follow up symptoms and evaluate for needs of chronic therapy for gastroparesis -good control of diabetes.   3-GERD and hematemesis: Secondary to gastric ulcer, severe esophagitis (candida esophagitis) and gastritis  -Status post EGD; no active bleeding appreciated.  -Continue PPI twice a day and Carafate.  -will treat with diflucan for 21 days -follow up with GI in outpatient setting.  4-leukocytosis: no signs of infection appreciated. Most likely stress demargination with dehydration and DKA  -at discharge WBC's back to WNL range -Patient remains afebrile.   5-chronic back pain and neuropathy: continue PRN pain meds and Neurontin  -patient might benefit of referral to pain clinic.  6-HTN: -stable. -will continue home medication regimen (lisinopril) -patient advised to follow a  low sodium heart healthy  diet.  7-Hypokalemia: repleted prior to discharge  8-Tobacco abuse: smoking cessation counseling provided -nicotine patch prescription provided   Procedures: See below for x-ray reports  EGD (06/17/2013): Severe esophagitis, gastric ulcer and positive gastritis.   Consultations:  GI   Discharge Exam: Filed Vitals:   06/18/13 1459  BP: 148/85  Pulse: 96  Temp: 97.7 F (36.5 C)  Resp: 18   General: Afebrile, NAD; still complaining of some upper abdomen area and on his legs  Cardiovascular: RRR, no rubs, gallops or murmurs  Respiratory: CTA bilaterally  Abdomen: soft, ND, positive BS; patient reports soreness with deep palpation in upper abdomen area.  Musculoskeletal: no edema, no cyanosis   Discharge Instructions You were cared for by a hospitalist during your hospital stay. If you have any questions about your discharge medications or the care you received while you were in the hospital after you are discharged, you can call the unit and asked to speak with the hospitalist on call if the hospitalist that took care of you is not available. Once you are discharged, your primary care physician will handle any further medical issues. Please note that NO REFILLS for any discharge medications will be authorized once you are discharged, as it is imperative that you return to your primary care physician (or establish a relationship with a primary care physician if you do not have one) for your aftercare needs so that they can reassess your need for medications and monitor your lab values.  Discharge Orders   Future Orders Complete By Expires   Diet - low sodium heart healthy  As directed    Discharge instructions  As directed        Medication List         fluconazole 100 MG tablet  Commonly known as:  DIFLUCAN  Take 1 tablet (100 mg total) by mouth daily.  Start taking on:  06/19/2013     gabapentin 600 MG tablet  Commonly known as:  NEURONTIN  Take 1 tablet (600 mg  total) by mouth 3 (three) times daily.     insulin NPH-regular Human (70-30) 100 UNIT/ML injection  Commonly known as:  NOVOLIN 70/30  Inject 25 Units into the skin 2 (two) times daily with a meal. Patient takes 15 units in the morning and 25 units at bedtime     insulin regular 100 units/mL injection  Commonly known as:  NOVOLIN R,HUMULIN R  Inject into the skin as directed. Sliding Scale. Inject required units based on Blood Glucose levels up to 5 times daily with meals.     lisinopril 20 MG tablet  Commonly known as:  PRINIVIL,ZESTRIL  Take 20 mg by mouth daily.     nicotine 21 mg/24hr patch  Commonly known as:  NICODERM CQ - dosed in mg/24 hours  Place 1 patch (21 mg total) onto the skin daily.     oxyCODONE 5 MG immediate release tablet  Commonly known as:  ROXICODONE  Take 1 tablet (5 mg total) by mouth every 8 (eight) hours as needed for severe pain.     pantoprazole 40 MG tablet  Commonly known as:  PROTONIX  Take 1 tablet (40 mg total) by mouth 2 (two) times daily before a meal.     sucralfate 1 GM/10ML suspension  Commonly known as:  CARAFATE  Take 10 mLs (1 g total) by mouth 4 (four) times daily -  with meals and at bedtime.     traMADol  50 MG tablet  Commonly known as:  ULTRAM  Take 50 mg by mouth every 6 (six) hours as needed. pain       Allergies  Allergen Reactions  . Ibuprofen Other (See Comments)    Stomach ulcers  . Naproxen Other (See Comments)    Stomach ulcers  . Sulfa Antibiotics Other (See Comments)    Stomach ulcers  . Morphine And Related Rash       Follow-up Information   Follow up with Celedonio Savage, MD In 1 week.   Specialty:  Family Medicine   Contact information:   [place] [address] [phone]       Follow up with Manus Rudd, MD. (office will call you with appointment details.)    Specialty:  Gastroenterology   Contact information:   [place] [address] [place]  [address] [phone]        The results of significant diagnostics from this hospitalization (including imaging, microbiology, ancillary and laboratory) are listed below for reference.    Significant Diagnostic Studies: Dg Chest Portable 1 View  06/15/2013   CLINICAL DATA:  Abdominal pain shortness of breath.  EXAM: PORTABLE CHEST - 1 VIEW  COMPARISON:  Single view of the chest 08/14/2011. PA and lateral chest 01/25/2011.  FINDINGS: The lungs are clear. Heart size is normal. There is no pneumothorax or pleural effusion.  IMPRESSION: Negative chest.   Electronically Signed   By: Inge Rise M.D.   On: 06/15/2013 15:39    Microbiology: Recent Results (from the past 240 hour(s))  MRSA PCR SCREENING     Status: None   Collection Time    06/15/13  5:27 PM      Result Value Ref Range Status   MRSA by PCR NEGATIVE  NEGATIVE Final   Comment:            The GeneXpert MRSA Assay (FDA     approved for NASAL specimens     only), is one component of a     comprehensive MRSA colonization     surveillance program. It is not     intended to diagnose MRSA     infection nor to guide or     monitor treatment for     MRSA infections.  KOH PREP     Status: None   Collection Time    06/17/13  7:54 AM      Result Value Ref Range Status   Specimen Description ESOPHAGUS BRUSHINGS   Final   Special Requests NONE   Final   KOH Prep     Final   Value: FEW YEAST WITH PSEUDOHYPHAE     Performed at [facility]   Report Status 06/17/2013 FINAL   Final     Labs: Basic Metabolic Panel:  Recent Labs Lab 06/15/13 1735  06/16/13 2100 06/16/13 2310 06/17/13 0116 06/17/13 0557 06/18/13 0446  NA 134*  < > 135* 133* 133* 135* 141  K 4.1  < > 3.0* 3.8 4.4 4.2 3.0*  CL 84*  < > 94* 92* 90* 96 101  CO2 14*  < > 31 26 26 28  32  GLUCOSE 569*  < > 89 265* 322* 217* 71  BUN 29*  < > 10 9 10 10  5*  CREATININE 1.03  < > 0.59 0.62 0.68 0.66 0.76  CALCIUM 8.9  < > 9.0 9.0 9.2 9.1 8.6  MG 2.2  --    --   --   --   --   --   < > =  values in this interval not displayed.  Recent Labs Lab 06/15/13 1735  LIPASE 8*   CBC:  Recent Labs Lab 06/15/13 1512 06/16/13 1053 06/18/13 0446  WBC 19.1* 19.0* 9.3  NEUTROABS 16.7*  --   --   HGB 15.6 13.1 10.9*  HCT 45.7 37.5* 32.5*  MCV 91.6 90.1 91.8  PLT 410* 359 254   CBG:  Recent Labs Lab 06/17/13 1154 06/17/13 1638 06/17/13 2104 06/18/13 0723 06/18/13 1120  GLUCAP 75 171* 135* 155* 125*    Signed:  Barton Dubois  [facility] 06/18/2013, 3:50 PM

## 2013-06-19 ENCOUNTER — Encounter: Payer: Self-pay | Admitting: Gastroenterology

## 2013-06-19 NOTE — Progress Notes (Signed)
Pt is aware of OV on 7/14 at 11 with AS and appt card mailed

## 2013-07-02 ENCOUNTER — Telehealth: Payer: Self-pay | Admitting: Gastroenterology

## 2013-07-02 NOTE — Telephone Encounter (Signed)
Please call pt. His stomach Bx shows gastritis AND HE DOES NOT HAVE H PYLORI INFECTION IN HIS STOMACH.   CONTINUE PROTONIX. TAKE 30 MINUTES PRIOR TO MEALS TWICE DAILY.  FOLLOW A LOW FAT/Diabetic DIET.   He needs an OPV e30 CANDIDA ESOPHAGITIS/REFLUX/PUD IN AUG 2015.  HE WILL NEED A  REPEAT UPPER ENDOSCOPY IN SEP 2015.

## 2013-07-02 NOTE — Telephone Encounter (Signed)
I called and informed pt of the results. He said he is having a lot of abdominal pain, he can hardly eat. His stomach is sensitive to touch. He is taking all of his meds as prescribed.   He is scheduled to come in for an OV on 08/19/2013 at 11:00 AM to see Laban Emperor, NP.  He feels he needs something done before then. Please advise!

## 2013-07-03 ENCOUNTER — Other Ambulatory Visit: Payer: Self-pay

## 2013-07-03 DIAGNOSIS — R109 Unspecified abdominal pain: Secondary | ICD-10-CM

## 2013-07-03 MED ORDER — SUCRALFATE 1 GM/10ML PO SUSP: 1.0000 g | Freq: Three times a day (TID) | ORAL | Status: DC

## 2013-07-03 NOTE — Addendum Note (Signed)
Addended by: Orvil Feil on: 07/03/2013 01:25 PM   Modules accepted: Orders

## 2013-07-03 NOTE — Telephone Encounter (Signed)
Check lipase, HFP.  I will send in refill of Carafate for short-term use. May need CT if no improvement

## 2013-07-03 NOTE — Telephone Encounter (Signed)
Routing to Laban Emperor, NP in Dr. Nona Dell absence today.

## 2013-07-03 NOTE — Telephone Encounter (Signed)
Called and informed pt. Lab orders faxed to [facility].

## 2013-07-03 NOTE — Telephone Encounter (Signed)
Reminder in epic to have repeat upper endoscopy done in Sept 2015

## 2013-07-07 LAB — HEPATIC FUNCTION PANEL
ALT: 18 U/L (ref 0–53)
AST: 18 U/L (ref 0–37)
Albumin: 4.4 g/dL (ref 3.5–5.2)
Alkaline Phosphatase: 115 U/L (ref 39–117)
BILIRUBIN DIRECT: 0.1 mg/dL (ref 0.0–0.3)
BILIRUBIN INDIRECT: 0.4 mg/dL (ref 0.2–1.2)
Total Bilirubin: 0.5 mg/dL (ref 0.2–1.2)
Total Protein: 7.3 g/dL (ref 6.0–8.3)

## 2013-07-07 LAB — LIPASE

## 2013-07-08 NOTE — Telephone Encounter (Signed)
PLEASE NOTE PT IS AN RMR PT. WOULD DISCUSS SUBSEQUENT MANAGEMENT WITH DR. [name].

## 2013-07-08 NOTE — Telephone Encounter (Signed)
Noted  

## 2013-07-16 NOTE — Progress Notes (Signed)
Quick Note:  Normal lipase and HFP.  If persistent abdominal pain, let's pursue CT abd/pelvis. ______

## 2013-08-19 ENCOUNTER — Other Ambulatory Visit: Payer: Self-pay | Admitting: Internal Medicine

## 2013-08-19 ENCOUNTER — Encounter (INDEPENDENT_AMBULATORY_CARE_PROVIDER_SITE_OTHER): Payer: Self-pay

## 2013-08-19 ENCOUNTER — Ambulatory Visit (INDEPENDENT_AMBULATORY_CARE_PROVIDER_SITE_OTHER): Payer: Managed Care, Other (non HMO) | Admitting: Gastroenterology

## 2013-08-19 ENCOUNTER — Encounter: Payer: Self-pay | Admitting: Gastroenterology

## 2013-08-19 VITALS — BP 120/85 | HR 94 | Temp 97.6°F | Resp 20 | Ht 73.0 in | Wt 172.0 lb

## 2013-08-19 DIAGNOSIS — R1013 Epigastric pain: Secondary | ICD-10-CM | POA: Insufficient documentation

## 2013-08-19 MED ORDER — DEXLANSOPRAZOLE 60 MG PO CPDR: 60.0000 mg | DELAYED_RELEASE_CAPSULE | Freq: Two times a day (BID) | ORAL | Status: DC

## 2013-08-19 MED ORDER — HYDROCODONE-ACETAMINOPHEN 5-325 MG PO TABS: 1.0000 | ORAL_TABLET | Freq: Four times a day (QID) | ORAL | Status: DC | PRN

## 2013-08-19 NOTE — Assessment & Plan Note (Signed)
28 year old male with known PUD documented via EGD in May 2015 in the setting of occasional NSAID use; no H.pylori on path. Candida esophagitis treated with Diflucan. No further dysphagia but persistent epigastric pain, somewhat out of proportion to physical findings and medical history. Gallbladder remains in situ but does not appear to be biliary in nature. Recent LFTs and lipase completely normal. Due to persistent pain, proceed with CT abd/pelvis. Add CBC. Switch from Protonix to [place]. Finally, proceed with surveillance EGD with Dr. [name], utilizing Propofol for sedation, which was used during last EGD. Risks and benefits discussed with stated understanding.

## 2013-08-19 NOTE — Progress Notes (Signed)
Referring Provider: Celedonio Savage, MD Primary Care Physician:  No primary provider on file. Primary GI: Dr. [name]   Chief Complaint  Patient presents with  . Follow-up    HPI:   Brent Daniels presents today in hospital follow-up after admission for DKA, hematemesis. EGD performed with 2 gastric ulcers, mild antral gastritis, candida esophagitis. Treated with Diflucan. On Protonix BID, Carafate QID. Constant abdominal pain. Epigastric location. Will ache and throb, sometimes constant pain. Will have vomiting spells every now and then. Eating makes it better if can keep it down. Gallbladder remains in situ. If reflux really bad, can't swallow well. Doesn't feel constipation. States maybe twice a week. Baseline for him. Oxycodone only thing that helps. Tried/failed Nexium. Failed Prilosec. Admitted to sparse use of NSAIDs during hospitalization. Adamantly denies any currently.   Past Medical History  Diagnosis Date  . Heart murmur   . Diabetes mellitus     type 1  . Arthritis   . Back pain   . GERD (gastroesophageal reflux disease)   . PUD (peptic ulcer disease)   . Hypertension     Past Surgical History  Procedure Laterality Date  . Tympanostomy tube placement    . Knee arthroscopy  06/30/2011    Procedure: ARTHROSCOPY KNEE;  Surgeon: Carole Civil, MD;  Location: [facility];  Service: Orthopedics;  Laterality: Right;  diagnostic arthroscopy  . Esophagogastroduodenoscopy (egd) with propofol  06/17/2013    Dr. Oneida Alar: two gastric ulcers in fundus, mild antral gastritis, candida esophagitis  . Esophageal biopsy  06/17/2013    Procedure: GASTRIC ULCER AND ANTRAL BIOPSIES; ESOPHAGEAL BRUSHING;  Surgeon: Danie Binder, MD;  Location: [facility];  Service: Endoscopy;;    Current Outpatient Prescriptions  Medication Sig Dispense Refill  . gabapentin (NEURONTIN) 600 MG tablet Take 1 tablet (600 mg total) by mouth 3 (three) times daily.  90 tablet  0  . insulin NPH-regular Human  (NOVOLIN 70/30) (70-30) 100 UNIT/ML injection Inject 25 Units into the skin 2 (two) times daily with a meal. Patient takes 15 units in the morning and 25 units at bedtime  10 mL  3  . insulin regular (HUMULIN R,NOVOLIN R) 100 units/mL injection Inject into the skin as directed. Sliding Scale. Inject required units based on Blood Glucose levels up to 5 times daily with meals.      Marland Kitchen lisinopril (PRINIVIL,ZESTRIL) 20 MG tablet Take 20 mg by mouth daily.      . nicotine (NICODERM CQ - DOSED IN MG/24 HOURS) 21 mg/24hr patch Place 1 patch (21 mg total) onto the skin daily.  28 patch  0  . oxyCODONE (ROXICODONE) 5 MG immediate release tablet Take 1 tablet (5 mg total) by mouth every 8 (eight) hours as needed for severe pain.  40 tablet  0  . pantoprazole (PROTONIX) 40 MG tablet Take 1 tablet (40 mg total) by mouth 2 (two) times daily before a meal.  60 tablet  2  . sucralfate (CARAFATE) 1 GM/10ML suspension Take 10 mLs (1 g total) by mouth 4 (four) times daily -  with meals and at bedtime.  420 mL  1  . traMADol (ULTRAM) 50 MG tablet Take 50 mg by mouth every 6 (six) hours as needed. pain      . dexlansoprazole (DEXILANT) 60 MG capsule Take 1 capsule (60 mg total) by mouth 2 (two) times daily.  60 capsule  3  . HYDROcodone-acetaminophen (NORCO) 5-325 MG per tablet Take 1 tablet  by mouth every 6 (six) hours as needed for moderate pain.  30 tablet  0   No current facility-administered medications for this visit.    Allergies as of 08/19/2013 - Review Complete 08/19/2013  Allergen Reaction Noted  . Ibuprofen Other (See Comments) 08/14/2011  . Naproxen Other (See Comments) 08/14/2011  . Sulfa antibiotics Other (See Comments) 08/14/2011  . Morphine and related Rash 01/04/2012    Family History  Problem Relation Age of Onset  . Cancer Father   . Alcohol abuse Father   . Pseudochol deficiency Neg Hx   . Malignant hyperthermia Neg Hx   . Hypotension Neg Hx   . Anesthesia problems Neg Hx   . Colon  cancer Neg Hx     History   Social History  . Marital Status: Single    Spouse Name: N/A    Number of Children: N/A  . Years of Education: 12   Occupational History  . unemployed     trying to get disability   Social History Main Topics  . Smoking status: Current Every Day Smoker -- 0.50 packs/day for 10 years    Types: Cigarettes  . Smokeless tobacco: Former Systems developer    Quit date: 09/17/2000     Comment: smokes 4 cigarettes a day   . Alcohol Use: No  . Drug Use: No  . Sexual Activity: None   Other Topics Concern  . None   Social History Narrative  . None    Review of Systems: As mentioned in HPI  Physical Exam: BP 120/85  Pulse 94  Temp(Src) 97.6 F (36.4 C) (Oral)  Resp 20  Ht 6\' 1"  (1.854 m)  Wt 172 lb (78.019 kg)  BMI 22.70 kg/m2 General:   Alert and oriented. No distress noted. Pleasant and cooperative.  Head:  Normocephalic and atraumatic. Eyes:  Conjuctiva clear without scleral icterus. Mouth:  Oral mucosa pink and moist. Good dentition. No lesions. Neck:  Supple, without mass or thyromegaly. Heart:  S1, S2 present without murmurs, rubs, or gallops. Regular rate and rhythm. Abdomen:  +BS, soft, mild discomfort with palpation diffusely but without peritoneal signs. non-distended. No rebound or guarding.  Extremities:  Without edema. Neurologic:  Alert and  oriented x4;  grossly normal neurologically. Skin:  Intact without significant lesions or rashes. Psych:  Alert and cooperative. Normal mood and affect.  Lab Results  Component Value Date   WBC 9.3 06/18/2013   HGB 10.9* 06/18/2013   HCT 32.5* 06/18/2013   MCV 91.8 06/18/2013   PLT 254 06/18/2013   Lab Results  Component Value Date   ALT 18 07/07/2013   AST 18 07/07/2013   ALKPHOS 115 07/07/2013   BILITOT 0.5 07/07/2013   Lab Results  Component Value Date   LIPASE <10 07/07/2013

## 2013-08-19 NOTE — Patient Instructions (Addendum)
We have provided Dexilant samples. Take this twice a day for now. I have sent this to your pharmacy as well.   We have scheduled a CT scan for further evaluation of your pain.  Please have blood work done as well.  Finally, we have set you up for a repeat upper endoscopy to make sure the ulcer is healing. Do not take any insulin the morning of the procedure.

## 2013-08-20 ENCOUNTER — Other Ambulatory Visit: Payer: Self-pay | Admitting: Internal Medicine

## 2013-08-20 DIAGNOSIS — R1013 Epigastric pain: Secondary | ICD-10-CM

## 2013-08-20 NOTE — Progress Notes (Signed)
No pcp

## 2013-08-23 ENCOUNTER — Emergency Department (HOSPITAL_COMMUNITY): Payer: Managed Care, Other (non HMO)

## 2013-08-23 ENCOUNTER — Encounter (HOSPITAL_COMMUNITY): Payer: Self-pay | Admitting: Emergency Medicine

## 2013-08-23 ENCOUNTER — Emergency Department (HOSPITAL_COMMUNITY)
Admission: EM | Admit: 2013-08-23 | Discharge: 2013-08-23 | Disposition: A | Discharge status: Home / Self Care | Payer: Managed Care, Other (non HMO) | Attending: Emergency Medicine | Admitting: Emergency Medicine

## 2013-08-23 DIAGNOSIS — R109 Unspecified abdominal pain: Secondary | ICD-10-CM

## 2013-08-23 DIAGNOSIS — Z8711 Personal history of peptic ulcer disease: Secondary | ICD-10-CM | POA: Insufficient documentation

## 2013-08-23 DIAGNOSIS — K219 Gastro-esophageal reflux disease without esophagitis: Secondary | ICD-10-CM | POA: Insufficient documentation

## 2013-08-23 DIAGNOSIS — Z79899 Other long term (current) drug therapy: Secondary | ICD-10-CM | POA: Insufficient documentation

## 2013-08-23 DIAGNOSIS — R739 Hyperglycemia, unspecified: Secondary | ICD-10-CM

## 2013-08-23 DIAGNOSIS — F17200 Nicotine dependence, unspecified, uncomplicated: Secondary | ICD-10-CM | POA: Insufficient documentation

## 2013-08-23 DIAGNOSIS — M129 Arthropathy, unspecified: Secondary | ICD-10-CM | POA: Insufficient documentation

## 2013-08-23 DIAGNOSIS — Z794 Long term (current) use of insulin: Secondary | ICD-10-CM | POA: Insufficient documentation

## 2013-08-23 DIAGNOSIS — E109 Type 1 diabetes mellitus without complications: Secondary | ICD-10-CM | POA: Insufficient documentation

## 2013-08-23 DIAGNOSIS — R011 Cardiac murmur, unspecified: Secondary | ICD-10-CM | POA: Insufficient documentation

## 2013-08-23 DIAGNOSIS — I10 Essential (primary) hypertension: Secondary | ICD-10-CM | POA: Insufficient documentation

## 2013-08-23 LAB — CBC WITH DIFFERENTIAL/PLATELET
BASOS ABS: 0.1 10*3/uL (ref 0.0–0.1)
Basophils Relative: 1 % (ref 0–1)
Eosinophils Absolute: 0.5 10*3/uL (ref 0.0–0.7)
Eosinophils Relative: 4 % (ref 0–5)
HCT: 37.4 % — ABNORMAL LOW (ref 39.0–52.0)
Hemoglobin: 13.1 g/dL (ref 13.0–17.0)
Lymphocytes Relative: 40 % (ref 12–46)
Lymphs Abs: 4.4 10*3/uL — ABNORMAL HIGH (ref 0.7–4.0)
MCH: 31.3 pg (ref 26.0–34.0)
MCHC: 35 g/dL (ref 30.0–36.0)
MCV: 89.3 fL (ref 78.0–100.0)
MONO ABS: 0.8 10*3/uL (ref 0.1–1.0)
Monocytes Relative: 7 % (ref 3–12)
NEUTROS ABS: 5.2 10*3/uL (ref 1.7–7.7)
NEUTROS PCT: 48 % (ref 43–77)
Platelets: 296 10*3/uL (ref 150–400)
RBC: 4.19 MIL/uL — ABNORMAL LOW (ref 4.22–5.81)
RDW: 13.1 % (ref 11.5–15.5)
WBC: 10.9 10*3/uL — ABNORMAL HIGH (ref 4.0–10.5)

## 2013-08-23 LAB — COMPREHENSIVE METABOLIC PANEL
ALBUMIN: 4.1 g/dL (ref 3.5–5.2)
ALT: 17 U/L (ref 0–53)
AST: 18 U/L (ref 0–37)
Alkaline Phosphatase: 108 U/L (ref 39–117)
Anion gap: 14 (ref 5–15)
BILIRUBIN TOTAL: 0.4 mg/dL (ref 0.3–1.2)
BUN: 8 mg/dL (ref 6–23)
CHLORIDE: 91 meq/L — AB (ref 96–112)
CO2: 29 mEq/L (ref 19–32)
CREATININE: 0.82 mg/dL (ref 0.50–1.35)
Calcium: 10 mg/dL (ref 8.4–10.5)
GFR calc Af Amer: >90 mL/min (ref >90)
GFR calc non Af Amer: >90 mL/min (ref >90)
Glucose, Bld: 552 mg/dL (ref 70–99)
POTASSIUM: 3.9 meq/L (ref 3.7–5.3)
Sodium: 134 mEq/L — ABNORMAL LOW (ref 137–147)
Total Protein: 7.5 g/dL (ref 6.0–8.3)

## 2013-08-23 LAB — URINALYSIS, ROUTINE W REFLEX MICROSCOPIC
Bilirubin Urine: NEGATIVE
Glucose, UA: >1000 mg/dL — AB
HGB URINE DIPSTICK: NEGATIVE
KETONES UR: NEGATIVE mg/dL
Leukocytes, UA: NEGATIVE
NITRITE: NEGATIVE
PROTEIN: NEGATIVE mg/dL
Specific Gravity, Urine: <1.005 — ABNORMAL LOW (ref 1.005–1.030)
UROBILINOGEN UA: 0.2 mg/dL (ref 0.0–1.0)
pH: 5.5 (ref 5.0–8.0)

## 2013-08-23 LAB — URINE MICROSCOPIC-ADD ON

## 2013-08-23 LAB — CBG MONITORING, ED: Glucose-Capillary: 157 mg/dL — ABNORMAL HIGH (ref 70–99)

## 2013-08-23 LAB — LIPASE, BLOOD: LIPASE: 29 U/L (ref 11–59)

## 2013-08-23 MED ORDER — HYDROMORPHONE HCL PF 1 MG/ML IJ SOLN
1.0000 mg | Freq: Once | INTRAMUSCULAR | Status: AC
  Administered 2013-08-23: 1 mg via INTRAVENOUS
  Filled 2013-08-23: qty 1

## 2013-08-23 MED ORDER — INSULIN ASPART 100 UNIT/ML ~~LOC~~ SOLN
10.0000 [IU] | Freq: Once | SUBCUTANEOUS | Status: AC
  Administered 2013-08-23: 10 [IU] via INTRAVENOUS
  Filled 2013-08-23: qty 1

## 2013-08-23 MED ORDER — SODIUM CHLORIDE 0.9 % IV BOLUS (SEPSIS)
1000.0000 mL | Freq: Once | INTRAVENOUS | Status: AC
  Administered 2013-08-23: 1000 mL via INTRAVENOUS

## 2013-08-23 MED ORDER — ONDANSETRON HCL 4 MG/2ML IJ SOLN
4.0000 mg | Freq: Once | INTRAMUSCULAR | Status: AC
  Administered 2013-08-23: 4 mg via INTRAVENOUS
  Filled 2013-08-23: qty 2

## 2013-08-23 MED ORDER — OXYCODONE HCL 5 MG PO TABS: 5.0000 mg | ORAL_TABLET | ORAL | Status: DC | PRN

## 2013-08-23 MED ORDER — IOHEXOL 300 MG/ML  SOLN
100.0000 mL | Freq: Once | INTRAMUSCULAR | Status: AC | PRN
  Administered 2013-08-23: 100 mL via INTRAVENOUS

## 2013-08-23 MED ORDER — PROMETHAZINE HCL 25 MG PO TABS: 25.0000 mg | ORAL_TABLET | Freq: Four times a day (QID) | ORAL | Status: DC | PRN

## 2013-08-23 MED ORDER — SODIUM CHLORIDE 0.9 % IV SOLN
Freq: Once | INTRAVENOUS | Status: AC
  Administered 2013-08-23: 06:00:00 via INTRAVENOUS

## 2013-08-23 MED ORDER — IOHEXOL 300 MG/ML  SOLN
50.0000 mL | Freq: Once | INTRAMUSCULAR | Status: AC | PRN
  Administered 2013-08-23: 50 mL via ORAL

## 2013-08-23 MED ORDER — PANTOPRAZOLE SODIUM 40 MG IV SOLR
40.0000 mg | Freq: Once | INTRAVENOUS | Status: AC
  Administered 2013-08-23: 40 mg via INTRAVENOUS
  Filled 2013-08-23: qty 40

## 2013-08-23 MED ORDER — GI COCKTAIL ~~LOC~~
30.0000 mL | Freq: Once | ORAL | Status: AC
  Administered 2013-08-23: 30 mL via ORAL
  Filled 2013-08-23: qty 30

## 2013-08-23 NOTE — ED Notes (Signed)
Patient states "I was diagnosed with a peptic ulcer about 2 months ago and started having severe abdominal pain radiating into my back about 7 hours ago." Also complaining of vomiting.

## 2013-08-23 NOTE — Discharge Instructions (Signed)
Follow up with your PCP and your gastroenterologist. Continue taking all of your medication for your stomach.  Peptic Ulcer A peptic ulcer is a sore in the lining of in your esophagus (esophageal ulcer), stomach (gastric ulcer), or in the first part of your small intestine (duodenal ulcer). The ulcer causes erosion into the deeper tissue. CAUSES  Normally, the lining of the stomach and the small intestine protects itself from the acid that digests food. The protective lining can be damaged by:  An infection caused by a bacterium called Helicobacter pylori (H. pylori).  Regular use of nonsteroidal anti-inflammatory drugs (NSAIDs), such as ibuprofen or aspirin.  Smoking tobacco. Other risk factors include being older than 94, drinking alcohol excessively, and having a family history of ulcer disease.  SYMPTOMS   Burning pain or gnawing in the area between the chest and the belly button.  Heartburn.  Nausea and vomiting.  Bloating. The pain can be worse on an empty stomach and at night. If the ulcer results in bleeding, it can cause:  Black, tarry stools.  Vomiting of bright red blood.  Vomiting of coffee ground looking materials. DIAGNOSIS  A diagnosis is usually made based upon your history and an exam. Other tests and procedures may be performed to find the cause of the ulcer. Finding a cause will help determine the best treatment. Tests and procedures may include:  Blood tests, stool tests, or breath tests to check for the bacterium H. pylori.  An upper gastrointestinal (GI) series of the esophagus, stomach, and small intestine.  An endoscopy to examine the esophagus, stomach, and small intestine.  A biopsy. TREATMENT  Treatment may include:  Eliminating the cause of the ulcer, such as smoking, NSAIDs, or alcohol.  Medicines to reduce the amount of acid in your digestive tract.  Antibiotic medicines if the ulcer is caused by the H. pylori bacterium.  An upper  endoscopy to treat a bleeding ulcer.  Surgery if the bleeding is severe or if the ulcer created a hole somewhere in the digestive system. HOME CARE INSTRUCTIONS   Avoid tobacco, alcohol, and caffeine. Smoking can increase the acid in the stomach, and continued smoking will impair the healing of ulcers.  Avoid foods and drinks that seem to cause discomfort or aggravate your ulcer.  Only take medicines as directed by your caregiver. Do not substitute over-the-counter medicines for prescription medicines without talking to your caregiver.  Keep any follow-up appointments and tests as directed. SEEK MEDICAL CARE IF:   Your do not improve within 7 days of starting treatment.  You have ongoing indigestion or heartburn. SEEK IMMEDIATE MEDICAL CARE IF:   You have sudden, sharp, or persistent abdominal pain.  You have bloody or dark black, tarry stools.  You vomit blood or vomit that looks like coffee grounds.  You become light headed, weak, or feel faint.  You become sweaty or clammy. MAKE SURE YOU:   Understand these instructions.  Will watch your condition.  Will get help right away if you are not doing well or get worse. Document Released: 01/21/2000 Document Revised: 10/18/2011 Document Reviewed: 08/23/2011 [facility] Patient Information 2015 [place], [place]. This information is not intended to replace advice given to you by your health care provider. Make sure you discuss any questions you have with your health care provider.  Oxycodone tablets or capsules What is this medicine? OXYCODONE (ox i KOE done) is a pain reliever. It is used to treat moderate to severe pain. This medicine may be used  for other purposes; ask your health care provider or pharmacist if you have questions. COMMON BRAND NAME(S): Dazidox, Endocodone, OXECTA, OxyIR, Percolone, Roxicodone What should I tell my health care provider before I take this medicine? They need to know if you have any of these  conditions: -Addison's disease -brain tumor -drug abuse or addiction -head injury -heart disease -if you frequently drink alcohol containing drinks -kidney disease or problems going to the bathroom -liver disease -lung disease, asthma, or breathing problems -mental problems -an unusual or allergic reaction to oxycodone, codeine, hydrocodone, morphine, other medicines, foods, dyes, or preservatives -pregnant or trying to get pregnant -breast-feeding How should I use this medicine? Take this medicine by mouth with a glass of water. Follow the directions on the prescription label. You can take it with or without food. If it upsets your stomach, take it with food. Take your medicine at regular intervals. Do not take it more often than directed. Do not stop taking except on your doctor's advice. Some brands of this medicine, like Oxecta, have special instructions. Ask your doctor or pharmacist if these directions are for you: Do not cut, crush or chew this medicine. Swallow only one tablet at a time. Do not wet, soak, or lick the tablet before you take it. Talk to your pediatrician regarding the use of this medicine in children. Special care may be needed. Overdosage: If you think you have taken too much of this medicine contact a poison control center or emergency room at once. NOTE: This medicine is only for you. Do not share this medicine with others. What if I miss a dose? If you miss a dose, take it as soon as you can. If it is almost time for your next dose, take only that dose. Do not take double or extra doses. What may interact with this medicine? -alcohol -antihistamines -certain medicines used for nausea like chlorpromazine, droperidol -erythromycin -ketoconazole -medicines for depression, anxiety, or psychotic disturbances -medicines for sleep -muscle relaxants -naloxone -naltrexone -narcotic medicines (opiates) for  pain -nilotinib -phenobarbital -phenytoin -rifampin -ritonavir -voriconazole This list may not describe all possible interactions. Give your health care provider a list of all the medicines, herbs, non-prescription drugs, or dietary supplements you use. Also tell them if you smoke, drink alcohol, or use illegal drugs. Some items may interact with your medicine. What should I watch for while using this medicine? Tell your doctor or health care professional if your pain does not go away, if it gets worse, or if you have new or a different type of pain. You may develop tolerance to the medicine. Tolerance means that you will need a higher dose of the medicine for pain relief. Tolerance is normal and is expected if you take this medicine for a long time. Do not suddenly stop taking your medicine because you may develop a severe reaction. Your body becomes used to the medicine. This does NOT mean you are addicted. Addiction is a behavior related to getting and using a drug for a non-medical reason. If you have pain, you have a medical reason to take pain medicine. Your doctor will tell you how much medicine to take. If your doctor wants you to stop the medicine, the dose will be slowly lowered over time to avoid any side effects. You may get drowsy or dizzy when you first start taking this medicine or change doses. Do not drive, use machinery, or do anything that may be dangerous until you know how the medicine affects you. Stand  or sit up slowly. There are different types of narcotic medicines (opiates) for pain. If you take more than one type at the same time, you may have more side effects. Give your health care provider a list of all medicines you use. Your doctor will tell you how much medicine to take. Do not take more medicine than directed. Call emergency for help if you have problems breathing. This medicine will cause constipation. Try to have a bowel movement at least every 2 to 3 days. If you do  not have a bowel movement for 3 days, call your doctor or health care professional. Your mouth may get dry. Drinking water, chewing sugarless gum, or sucking on hard candy may help. See your dentist every 6 months. What side effects may I notice from receiving this medicine? Side effects that you should report to your doctor or health care professional as soon as possible: -allergic reactions like skin rash, itching or hives, swelling of the face, lips, or tongue -breathing problems -confusion -feeling faint or lightheaded, falls -trouble passing urine or change in the amount of urine -unusually weak or tired Side effects that usually do not require medical attention (report to your doctor or health care professional if they continue or are bothersome): -constipation -dry mouth -itching -nausea, vomiting -upset stomach This list may not describe all possible side effects. Call your doctor for medical advice about side effects. You may report side effects to FDA at 1-800-FDA-1088. Where should I keep my medicine? Keep out of the reach of children. This medicine can be abused. Keep your medicine in a safe place to protect it from theft. Do not share this medicine with anyone. Selling or giving away this medicine is dangerous and against the law. Store at room temperature between 15 and 30 degrees C (59 and 86 degrees F). Protect from light. Keep container tightly closed. This medicine may cause accidental overdose and death if it is taken by other adults, children, or pets. Flush any unused medicine down the toilet to reduce the chance of harm. Do not use the medicine after the expiration date. NOTE: This sheet is a summary. It may not cover all possible information. If you have questions about this medicine, talk to your doctor, pharmacist, or health care provider.  2015, [employer]. (2012-10-03 13:43:33)  Promethazine tablets What is this medicine? PROMETHAZINE (proe METH a  zeen) is an antihistamine. It is used to treat allergic reactions and to treat or prevent nausea and vomiting from illness or motion sickness. It is also used to make you sleep before surgery, and to help treat pain or nausea after surgery. This medicine may be used for other purposes; ask your health care provider or pharmacist if you have questions. COMMON BRAND NAME(S): Phenergan What should I tell my health care provider before I take this medicine? They need to know if you have any of these conditions: -glaucoma -high blood pressure or heart disease -kidney disease -liver disease -lung or breathing disease, like asthma -prostate trouble -pain or difficulty passing urine -seizures -an unusual or allergic reaction to promethazine or phenothiazines, other medicines, foods, dyes, or preservatives -pregnant or trying to get pregnant -breast-feeding How should I use this medicine? Take this medicine by mouth with a glass of water. Follow the directions on the prescription label. Take your doses at regular intervals. Do not take your medicine more often than directed. Talk to your pediatrician regarding the use of this medicine in children. Special care may be  needed. This medicine should not be given to infants and children younger than 12 years old. Overdosage: If you think you have taken too much of this medicine contact a poison control center or emergency room at once. NOTE: This medicine is only for you. Do not share this medicine with others. What if I miss a dose? If you miss a dose, take it as soon as you can. If it is almost time for your next dose, take only that dose. Do not take double or extra doses. What may interact with this medicine? Do not take this medicine with any of the following medications: -cisapride -dofetilide -dronedarone -MAOIs like Carbex, Eldepryl, Marplan, Nardil, Parnate -pimozide -quinidine, including dextromethorphan;  quinidine -thioridazine -ziprasidone This medicine may also interact with the following medications: -certain medicines for depression, anxiety, or psychotic disturbances -certain medicines for anxiety or sleep -certain medicines for seizures like carbamazepine, phenobarbital, phenytoin -certain medicines for movement abnormalities as in Parkinson's disease, or for gastrointestinal problems -epinephrine -medicines for allergies or colds -muscle relaxants -narcotic medicines for pain -other medicines that prolong the QT interval (cause an abnormal heart rhythm) -tramadol -trimethobenzamide This list may not describe all possible interactions. Give your health care provider a list of all the medicines, herbs, non-prescription drugs, or dietary supplements you use. Also tell them if you smoke, drink alcohol, or use illegal drugs. Some items may interact with your medicine. What should I watch for while using this medicine? Tell your doctor or health care professional if your symptoms do not start to get better in 1 to 2 days. You may get drowsy or dizzy. Do not drive, use machinery, or do anything that needs mental alertness until you know how this medicine affects you. To reduce the risk of dizzy or fainting spells, do not stand or sit up quickly, especially if you are an older patient. Alcohol may increase dizziness and drowsiness. Avoid alcoholic drinks. Your mouth may get dry. Chewing sugarless gum or sucking hard candy, and drinking plenty of water may help. Contact your doctor if the problem does not go away or is severe. This medicine may cause dry eyes and blurred vision. If you wear contact lenses you may feel some discomfort. Lubricating drops may help. See your eye doctor if the problem does not go away or is severe. This medicine can make you more sensitive to the sun. Keep out of the sun. If you cannot avoid being in the sun, wear protective clothing and use sunscreen. Do not use sun  lamps or tanning beds/booths. If you are diabetic, check your blood-sugar levels regularly. What side effects may I notice from receiving this medicine? Side effects that you should report to your doctor or health care professional as soon as possible: -blurred vision -irregular heartbeat, palpitations or chest pain -muscle or facial twitches -pain or difficulty passing urine -seizures -skin rash -slowed or shallow breathing -unusual bleeding or bruising -yellowing of the eyes or skin Side effects that usually do not require medical attention (report to your doctor or health care professional if they continue or are bothersome): -headache -nightmares, agitation, nervousness, excitability, not able to sleep (these are more likely in children) -stuffy nose This list may not describe all possible side effects. Call your doctor for medical advice about side effects. You may report side effects to FDA at 1-800-FDA-1088. Where should I keep my medicine? Keep out of the reach of children. Store at room temperature, between 20 and 25 degrees C (68 and 77 degrees  F). Protect from light. Throw away any unused medicine after the expiration date. NOTE: This sheet is a summary. It may not cover all possible information. If you have questions about this medicine, talk to your doctor, pharmacist, or health care provider.  2015, [employer]. (2012-09-24 15:04:46)

## 2013-08-23 NOTE — ED Notes (Signed)
CRITICAL VALUE ALERT  Critical value received:  Glucose - 552  Date of notification:  08/23/2013  Time of notification:  [id number]  Critical value read back: yes  Nurse who received alert:  [id number]  MD notified (1st page):  Dr Roxanne Mins  Time of first page:  [id number]  MD notified (2nd page):  Time of second page:  Responding MD:  Dr Roxanne Mins  Time MD responded:  [id number]

## 2013-08-23 NOTE — ED Provider Notes (Signed)
CSN: [id number]     Arrival date & time 08/23/13  0335 History   None    Chief Complaint  Patient presents with  . Abdominal Pain     (Consider location/radiation/quality/duration/timing/severity/associated sxs/prior Treatment) Patient is a 28 y.o. male presenting with abdominal pain. The history is provided by the patient.  Abdominal Pain He has been treated for gastric ulcers but noted sudden onset about 7 hours ago of severe epigastric pain with some radiation to the back and chest. Pain is dull with sharp components. There is associated nausea and vomiting. He denies any hematemesis. Pain is somewhat better he gets into a fetal position. Nothing makes it worse. He denies fever chills or sweats. He denies recent alcohol or NSAID use. He does smoke 4 cigarettes a day. He had seen his gastroenterologist earlier this week and was started on dexlansoprazole.  Past Medical History  Diagnosis Date  . Heart murmur   . Diabetes mellitus     type 1  . Arthritis   . Back pain   . GERD (gastroesophageal reflux disease)   . PUD (peptic ulcer disease)   . Hypertension    Past Surgical History  Procedure Laterality Date  . Tympanostomy tube placement    . Knee arthroscopy  06/30/2011    Procedure: ARTHROSCOPY KNEE;  Surgeon: Carole Civil, MD;  Location: [facility];  Service: Orthopedics;  Laterality: Right;  diagnostic arthroscopy  . Esophagogastroduodenoscopy (egd) with propofol  06/17/2013    Dr. Oneida Alar: two gastric ulcers in fundus, mild antral gastritis, candida esophagitis  . Esophageal biopsy  06/17/2013    Procedure: GASTRIC ULCER AND ANTRAL BIOPSIES; ESOPHAGEAL BRUSHING;  Surgeon: Danie Binder, MD;  Location: [facility];  Service: Endoscopy;;   Family History  Problem Relation Age of Onset  . Cancer Father   . Alcohol abuse Father   . Pseudochol deficiency Neg Hx   . Malignant hyperthermia Neg Hx   . Hypotension Neg Hx   . Anesthesia problems Neg Hx   . Colon cancer Neg Hx     History  Substance Use Topics  . Smoking status: Current Every Day Smoker -- 0.50 packs/day for 10 years    Types: Cigarettes  . Smokeless tobacco: Former Systems developer    Quit date: 09/17/2000     Comment: smokes 4 cigarettes a day   . Alcohol Use: No    Review of Systems  Gastrointestinal: Positive for abdominal pain.  All other systems reviewed and are negative.     Allergies  Hydrocodone; Ibuprofen; Naproxen; Sulfa antibiotics; and Morphine and related  Home Medications   Prior to Admission medications   Medication Sig Start Date End Date Taking? Authorizing Provider  dexlansoprazole (DEXILANT) 60 MG capsule Take 1 capsule (60 mg total) by mouth 2 (two) times daily. 08/19/13   Orvil Feil, NP  gabapentin (NEURONTIN) 600 MG tablet Take 1 tablet (600 mg total) by mouth 3 (three) times daily. 06/18/13   Barton Dubois, MD  HYDROcodone-acetaminophen (NORCO) 5-325 MG per tablet Take 1 tablet by mouth every 6 (six) hours as needed for moderate pain. 08/19/13   Orvil Feil, NP  insulin NPH-regular Human (NOVOLIN 70/30) (70-30) 100 UNIT/ML injection Inject 25 Units into the skin 2 (two) times daily with a meal. Patient takes 15 units in the morning and 25 units at bedtime 06/18/13   Barton Dubois, MD  insulin regular (HUMULIN R,NOVOLIN R) 100 units/mL injection Inject into the skin as directed. Sliding Scale. Inject required units  based on Blood Glucose levels up to 5 times daily with meals.    Historical Provider, MD  lisinopril (PRINIVIL,ZESTRIL) 20 MG tablet Take 20 mg by mouth daily.    Historical Provider, MD  nicotine (NICODERM CQ - DOSED IN MG/24 HOURS) 21 mg/24hr patch Place 1 patch (21 mg total) onto the skin daily. 06/18/13   Barton Dubois, MD  oxyCODONE (ROXICODONE) 5 MG immediate release tablet Take 1 tablet (5 mg total) by mouth every 8 (eight) hours as needed for severe pain. 06/18/13   Barton Dubois, MD  pantoprazole (PROTONIX) 40 MG tablet Take 1 tablet (40 mg total) by mouth 2 (two)  times daily before a meal. 06/18/13   Barton Dubois, MD  sucralfate (CARAFATE) 1 GM/10ML suspension Take 10 mLs (1 g total) by mouth 4 (four) times daily -  with meals and at bedtime. 07/03/13   Orvil Feil, NP  traMADol (ULTRAM) 50 MG tablet Take 50 mg by mouth every 6 (six) hours as needed. pain    Historical Provider, MD   BP 167/110  Pulse 118  Temp(Src) 98.1 F (36.7 C) (Oral)  Resp 20  Ht 6\' 1"  (1.854 m)  Wt 185 lb (83.915 kg)  BMI 24.41 kg/m2  SpO2 100% Physical Exam  Nursing note and vitals reviewed.  .28 year old male, who appears to be in pain, but in no acute distress. Vital signs are significant for tachycardia with heart rate 118, and hypertension with blood pressure 100/60 seven over one. Oxygen saturation is 100%, which is normal. Head is normocephalic and atraumatic. PERRLA, EOMI. Oropharynx is clear. Neck is nontender and supple without adenopathy or JVD. Back is nontender and there is no CVA tenderness. Lungs are clear without rales, wheezes, or rhonchi. Chest is nontender. Heart has regular rate and rhythm without murmur. Abdomen is soft, flat, with moderate tenderness across the upper abdomen. There's some voluntary guarding but no rebound tenderness. There are no masses or hepatosplenomegaly and peristalsis is hypoactive. Extremities have no cyanosis or edema, full range of motion is present. Skin is warm and dry without rash. Neurologic: Mental status is normal, cranial nerves are intact, there are no motor or sensory deficits.  ED Course  Procedures (including critical care time) Labs Review Results for orders placed during the hospital encounter of 08/23/13  CBC WITH DIFFERENTIAL      Result Value Ref Range   WBC 10.9 (*) 4.0 - 10.5 K/uL   RBC 4.19 (*) 4.22 - 5.81 MIL/uL   Hemoglobin 13.1  13.0 - 17.0 g/dL   HCT 37.4 (*) 39.0 - 52.0 %   MCV 89.3  78.0 - 100.0 fL   MCH 31.3  26.0 - 34.0 pg   MCHC 35.0  30.0 - 36.0 g/dL   RDW 13.1  11.5 - 15.5 %    Platelets 296  150 - 400 K/uL   Neutrophils Relative % 48  43 - 77 %   Neutro Abs 5.2  1.7 - 7.7 K/uL   Lymphocytes Relative 40  12 - 46 %   Lymphs Abs 4.4 (*) 0.7 - 4.0 K/uL   Monocytes Relative 7  3 - 12 %   Monocytes Absolute 0.8  0.1 - 1.0 K/uL   Eosinophils Relative 4  0 - 5 %   Eosinophils Absolute 0.5  0.0 - 0.7 K/uL   Basophils Relative 1  0 - 1 %   Basophils Absolute 0.1  0.0 - 0.1 K/uL  COMPREHENSIVE METABOLIC PANEL  Result Value Ref Range   Sodium 134 (*) 137 - 147 mEq/L   Potassium 3.9  3.7 - 5.3 mEq/L   Chloride 91 (*) 96 - 112 mEq/L   CO2 29  19 - 32 mEq/L   Glucose, Bld 552 (*) 70 - 99 mg/dL   BUN 8  6 - 23 mg/dL   Creatinine, Ser 0.82  0.50 - 1.35 mg/dL   Calcium 10.0  8.4 - 10.5 mg/dL   Total Protein 7.5  6.0 - 8.3 g/dL   Albumin 4.1  3.5 - 5.2 g/dL   AST 18  0 - 37 U/L   ALT 17  0 - 53 U/L   Alkaline Phosphatase 108  39 - 117 U/L   Total Bilirubin 0.4  0.3 - 1.2 mg/dL   GFR calc non Af Amer >90  >90 mL/min   GFR calc Af Amer >90  >90 mL/min   Anion gap 14  5 - 15  LIPASE, BLOOD      Result Value Ref Range   Lipase 29  11 - 59 U/L  URINALYSIS, ROUTINE W REFLEX MICROSCOPIC      Result Value Ref Range   Color, Urine YELLOW  YELLOW   APPearance CLEAR  CLEAR   Specific Gravity, Urine <1.005 (*) 1.005 - 1.030   pH 5.5  5.0 - 8.0   Glucose, UA >1000 (*) NEGATIVE mg/dL   Hgb urine dipstick NEGATIVE  NEGATIVE   Bilirubin Urine NEGATIVE  NEGATIVE   Ketones, ur NEGATIVE  NEGATIVE mg/dL   Protein, ur NEGATIVE  NEGATIVE mg/dL   Urobilinogen, UA 0.2  0.0 - 1.0 mg/dL   Nitrite NEGATIVE  NEGATIVE   Leukocytes, UA NEGATIVE  NEGATIVE  URINE MICROSCOPIC-ADD ON      Result Value Ref Range   Squamous Epithelial / LPF RARE  RARE   WBC, UA 0-2  <3 WBC/hpf   RBC / HPF 0-2  <3 RBC/hpf   Bacteria, UA RARE  RARE  CBG MONITORING, ED      Result Value Ref Range   Glucose-Capillary 157 (*) 70 - 99 mg/dL   Imaging Review Ct Abdomen Pelvis W Contrast  08/23/2013    CLINICAL DATA:  Epigastric abdominal pain and back pain. Nausea and vomiting. History of smoking.  EXAM: CT ABDOMEN AND PELVIS WITH CONTRAST  TECHNIQUE: Multidetector CT imaging of the abdomen and pelvis was performed using the standard protocol following bolus administration of intravenous contrast.  CONTRAST:  18mL OMNIPAQUE IOHEXOL 300 MG/ML SOLN, 152mL OMNIPAQUE IOHEXOL 300 MG/ML SOLN  COMPARISON:  Abdominal radiograph performed 04/05/2007  FINDINGS: Minimal right basilar atelectasis is noted.  The liver and spleen are unremarkable in appearance. The gallbladder is within normal limits. The pancreas and adrenal glands are unremarkable.  The kidneys are unremarkable in appearance. There is no evidence of hydronephrosis. No renal or ureteral stones are seen. No perinephric stranding is appreciated.  No free fluid is identified. The small bowel is unremarkable in appearance. The stomach is within normal limits. No acute vascular abnormalities are seen. Minimal calcification is seen along the common iliac arteries bilaterally.  The appendix is difficult to fully assess, but there is no evidence for appendicitis. The colon is partially filled with stool and is unremarkable in appearance.  The bladder is moderately distended and grossly unremarkable. The prostate remains normal in size. No inguinal lymphadenopathy is seen.  No acute osseous abnormalities are identified.  IMPRESSION: No acute abnormality seen in the abdomen or pelvis.  Electronically Signed   By: Garald Balding M.D.   On: 08/23/2013 06:15   Images viewed by me.   MDM   Final diagnoses:  Abdominal pain, unspecified abdominal location  Hyperglycemia    Severe abdominal pain in patient with known history of ulcer disease. Old records are reviewed and he did have an EGD showing esophagitis as well as gastritis and gastric ulcers when he was hospitalized at 2 months ago. That hospitalization was actually for diabetic ketoacidosis. Office record  states that he was to be scheduled for CT of his abdomen. You'll be sent for CT today both to evaluate his more chronic pain and also has acute pain. Certainly, he is at risk for perforation. In the meantime, he will be given IV fluids, hydromorphone for pain, ondansetron for nausea, and pantoprazole.  He initially did not get adequate pain relief with hydromorphone but did get adequate relief with the second dose. The trigger is come down with IV hydration and IV insulin. CT and laboratory workup is otherwise unremarkable. He states that he is unable to tolerate hydrocodone-acetaminophen which was prescribed by his gastroenterologist and he feels that if he had a prescription for oxycodone that he would do better. He had been prescribed that when he was discharged from the hospital. He is also requesting a prescription for promethazine. He is discharged with prescriptions for oxycodone and promethazine and is to followup with his PCP and with his gastroenterologist. Blood sugar has come down well with IV hydration and a single dose of insulin.  Delora Fuel, MD [id number]

## 2013-08-23 NOTE — ED Notes (Signed)
Patient complaining of returning pain. Advised Dr Roxanne Mins.

## 2013-08-28 ENCOUNTER — Encounter (HOSPITAL_COMMUNITY): Payer: Self-pay | Admitting: Pharmacy Technician

## 2013-09-03 NOTE — Patient Instructions (Signed)
CALYB SIMONSON  09/03/2013   Your procedure is scheduled on:  09/11/13  Report to [facility] at 06:15 AM.  Call this number if you have problems the morning of surgery: [phone]   Remember:   Do not eat food or drink liquids after midnight.   Take these medicines the morning of surgery with A SIP OF WATER: Lisinopril, Protonix, Gabapentin. May take your Oxycodone, Tizanidine, Tramadol and Phenergan if needed. Take only half of your dose of Novolin 70/30 insulin the night before your procedure.   Do not wear jewelry, make-up or nail polish.  Do not wear lotions, powders, or perfumes. You may wear deodorant.  Do not shave 48 hours prior to surgery. Men may shave face and neck.  Do not bring valuables to the hospital.  [facility] is not responsible for any belongings or valuables.               Contacts, dentures or bridgework may not be worn into surgery.  Leave suitcase in the car. After surgery it may be brought to your room.  For patients admitted to the hospital, discharge time is determined by your treatment team.               Patients discharged the day of surgery will not be allowed to drive home.    Special Instructions: Shower using Hibiclens the night before surgery and the morning of surgery.   Please read over the following fact sheets that you were given: Anesthesia Post-op Instructions and Care and Recovery After Surgery    Esophagogastroduodenoscopy Esophagogastroduodenoscopy (EGD) is a procedure to examine the lining of the esophagus, stomach, and first part of the small intestine (duodenum). A long, flexible, lighted tube with a camera attached (endoscope) is inserted down the throat to view these organs. This procedure is done to detect problems or abnormalities, such as inflammation, bleeding, ulcers, or growths, in order to treat them. The procedure lasts about 5-20 minutes. It is usually an outpatient procedure, but it may need to be performed in emergency cases  in the hospital. LET YOUR CAREGIVER KNOW ABOUT:   Allergies to food or medicine.  All medicines you are taking, including vitamins, herbs, eyedrops, and over-the-counter medicines and creams.  Use of steroids (by mouth or creams).  Previous problems you or members of your family have had with the use of anesthetics.  Any blood disorders you have.  Previous surgeries you have had.  Other health problems you have.  Possibility of pregnancy, if this applies. RISKS AND COMPLICATIONS  Generally, EGD is a safe procedure. However, as with any procedure, complications can occur. Possible complications include:  Infection.  Bleeding.  Tearing (perforation) of the esophagus, stomach, or duodenum.  Difficulty breathing or not being able to breath.  Excessive sweating.  Spasms of the larynx.  Slowed heartbeat.  Low blood pressure. BEFORE THE PROCEDURE  Do not eat or drink anything for 6-8 hours before the procedure or as directed by your caregiver.  Ask your caregiver about changing or stopping your regular medicines.  If you wear dentures, be prepared to remove them before the procedure.  Arrange for someone to drive you home after the procedure. PROCEDURE   A vein will be accessed to give medicines and fluids. A medicine to relax you (sedative) and a pain reliever will be given through that access into the vein.  A numbing medicine (local anesthetic) may be sprayed on your throat for comfort and to stop you from  gagging or coughing.  A mouth guard may be placed in your mouth to protect your teeth and to keep you from biting on the endoscope.  You will be asked to lie on your left side.  The endoscope is inserted down your throat and into the esophagus, stomach, and duodenum.  Air is put through the endoscope to allow your caregiver to view the lining of your esophagus clearly.  The esophagus, stomach, and duodenum is then examined. During the exam, your caregiver  may:  Remove tissue to be examined under a microscope (biopsy) for inflammation, infection, or other medical problems.  Remove growths.  Remove objects (foreign bodies) that are stuck.  Treat any bleeding with medicines or other devices that stop tissues from bleeding (hot cautery, clipping devices).  Widen (dilate) or stretch narrowed areas of the esophagus and stomach.  The endoscope will then be withdrawn. AFTER THE PROCEDURE  You will be taken to a recovery area to be monitored. You will be able to go home once you are stable and alert.  Do not eat or drink anything until the local anesthetic and numbing medicines have worn off. You may choke.  It is normal to feel bloated, have pain with swallowing, or have a sore throat for a short time. This will wear off.  Your caregiver should be able to discuss his or her findings with you. It will take longer to discuss the test results if any biopsies were taken. Document Released: 05/26/2004 Document Revised: 06/09/2013 Document Reviewed: 12/27/2011 [facility] Patient Information 2015 [place], [place]. This information is not intended to replace advice given to you by your health care provider. Make sure you discuss any questions you have with your health care provider.    PATIENT INSTRUCTIONS POST-ANESTHESIA  IMMEDIATELY FOLLOWING SURGERY:  Do not drive or operate machinery for the first twenty four hours after surgery.  Do not make any important decisions for twenty four hours after surgery or while taking narcotic pain medications or sedatives.  If you develop intractable nausea and vomiting or a severe headache please notify your doctor immediately.  FOLLOW-UP:  Please make an appointment with your surgeon as instructed. You do not need to follow up with anesthesia unless specifically instructed to do so.  WOUND CARE INSTRUCTIONS (if applicable):  Keep a dry clean dressing on the anesthesia/puncture wound site if there is drainage.   Once the wound has quit draining you may leave it open to air.  Generally you should leave the bandage intact for twenty four hours unless there is drainage.  If the epidural site drains for more than 36-48 hours please call the anesthesia department.  QUESTIONS?:  Please feel free to call your physician or the hospital operator if you have any questions, and they will be happy to assist you.

## 2013-09-04 NOTE — Patient Instructions (Signed)
Brent Daniels  09/04/2013   Your procedure is scheduled on:  09/11/13  Report to [facility] at 06:15 AM.  Call this number if you have problems the morning of surgery: [phone]   Remember:   Do not eat food or drink liquids after midnight.   Take these medicines the morning of surgery with A SIP OF WATER: Gabapentin, Lisinopril and Pantoprazole. May take your Oxycodone, Tramadol and Promethazine if needed.   Do not wear jewelry, make-up or nail polish.  Do not wear lotions, powders, or perfumes. You may wear deodorant.  Do not shave 48 hours prior to surgery. Men may shave face and neck.  Do not bring valuables to the hospital.  [facility] is not responsible for any belongings or valuables.               Contacts, dentures or bridgework may not be worn into surgery.  Leave suitcase in the car. After surgery it may be brought to your room.  For patients admitted to the hospital, discharge time is determined by your treatment team.               Patients discharged the day of surgery will not be allowed to drive home.    Special Instructions: N/A   Please read over the following fact sheets that you were given: Anesthesia Post-op Instructions and Care and Recovery After Surgery    Esophagogastroduodenoscopy Esophagogastroduodenoscopy (EGD) is a procedure to examine the lining of the esophagus, stomach, and first part of the small intestine (duodenum). A long, flexible, lighted tube with a camera attached (endoscope) is inserted down the throat to view these organs. This procedure is done to detect problems or abnormalities, such as inflammation, bleeding, ulcers, or growths, in order to treat them. The procedure lasts about 5-20 minutes. It is usually an outpatient procedure, but it may need to be performed in emergency cases in the hospital. LET YOUR CAREGIVER KNOW ABOUT:   Allergies to food or medicine.  All medicines you are taking, including vitamins, herbs, eyedrops, and  over-the-counter medicines and creams.  Use of steroids (by mouth or creams).  Previous problems you or members of your family have had with the use of anesthetics.  Any blood disorders you have.  Previous surgeries you have had.  Other health problems you have.  Possibility of pregnancy, if this applies. RISKS AND COMPLICATIONS  Generally, EGD is a safe procedure. However, as with any procedure, complications can occur. Possible complications include:  Infection.  Bleeding.  Tearing (perforation) of the esophagus, stomach, or duodenum.  Difficulty breathing or not being able to breath.  Excessive sweating.  Spasms of the larynx.  Slowed heartbeat.  Low blood pressure. BEFORE THE PROCEDURE  Do not eat or drink anything for 6-8 hours before the procedure or as directed by your caregiver.  Ask your caregiver about changing or stopping your regular medicines.  If you wear dentures, be prepared to remove them before the procedure.  Arrange for someone to drive you home after the procedure. PROCEDURE   A vein will be accessed to give medicines and fluids. A medicine to relax you (sedative) and a pain reliever will be given through that access into the vein.  A numbing medicine (local anesthetic) may be sprayed on your throat for comfort and to stop you from gagging or coughing.  A mouth guard may be placed in your mouth to protect your teeth and to keep you from biting on the endoscope.  You will be asked to lie on your left side.  The endoscope is inserted down your throat and into the esophagus, stomach, and duodenum.  Air is put through the endoscope to allow your caregiver to view the lining of your esophagus clearly.  The esophagus, stomach, and duodenum is then examined. During the exam, your caregiver may:  Remove tissue to be examined under a microscope (biopsy) for inflammation, infection, or other medical problems.  Remove growths.  Remove objects  (foreign bodies) that are stuck.  Treat any bleeding with medicines or other devices that stop tissues from bleeding (hot cautery, clipping devices).  Widen (dilate) or stretch narrowed areas of the esophagus and stomach.  The endoscope will then be withdrawn. AFTER THE PROCEDURE  You will be taken to a recovery area to be monitored. You will be able to go home once you are stable and alert.  Do not eat or drink anything until the local anesthetic and numbing medicines have worn off. You may choke.  It is normal to feel bloated, have pain with swallowing, or have a sore throat for a short time. This will wear off.  Your caregiver should be able to discuss his or her findings with you. It will take longer to discuss the test results if any biopsies were taken. Document Released: 05/26/2004 Document Revised: 06/09/2013 Document Reviewed: 12/27/2011 [facility] Patient Information 2015 [place], [place]. This information is not intended to replace advice given to you by your health care provider. Make sure you discuss any questions you have with your health care provider.    PATIENT INSTRUCTIONS POST-ANESTHESIA  IMMEDIATELY FOLLOWING SURGERY:  Do not drive or operate machinery for the first twenty four hours after surgery.  Do not make any important decisions for twenty four hours after surgery or while taking narcotic pain medications or sedatives.  If you develop intractable nausea and vomiting or a severe headache please notify your doctor immediately.  FOLLOW-UP:  Please make an appointment with your surgeon as instructed. You do not need to follow up with anesthesia unless specifically instructed to do so.  WOUND CARE INSTRUCTIONS (if applicable):  Keep a dry clean dressing on the anesthesia/puncture wound site if there is drainage.  Once the wound has quit draining you may leave it open to air.  Generally you should leave the bandage intact for twenty four hours unless there is drainage.   If the epidural site drains for more than 36-48 hours please call the anesthesia department.  QUESTIONS?:  Please feel free to call your physician or the hospital operator if you have any questions, and they will be happy to assist you.

## 2013-09-05 ENCOUNTER — Other Ambulatory Visit: Payer: Self-pay

## 2013-09-05 ENCOUNTER — Encounter (HOSPITAL_COMMUNITY): Payer: Self-pay

## 2013-09-05 ENCOUNTER — Encounter (HOSPITAL_COMMUNITY)
Admission: RE | Admit: 2013-09-05 | Discharge: 2013-09-05 | Disposition: A | Discharge status: Home / Self Care | Payer: Managed Care, Other (non HMO) | Source: Ambulatory Visit | Attending: Internal Medicine | Admitting: Internal Medicine

## 2013-09-05 DIAGNOSIS — Z01818 Encounter for other preprocedural examination: Secondary | ICD-10-CM | POA: Insufficient documentation

## 2013-09-05 DIAGNOSIS — Z01812 Encounter for preprocedural laboratory examination: Secondary | ICD-10-CM | POA: Insufficient documentation

## 2013-09-05 HISTORY — DX: Polyneuropathy, unspecified: G62.9

## 2013-09-05 LAB — BASIC METABOLIC PANEL
ANION GAP: 13 (ref 5–15)
BUN: 13 mg/dL (ref 6–23)
CO2: 29 meq/L (ref 19–32)
Calcium: 9.9 mg/dL (ref 8.4–10.5)
Chloride: 93 mEq/L — ABNORMAL LOW (ref 96–112)
Creatinine, Ser: 0.74 mg/dL (ref 0.50–1.35)
GFR calc Af Amer: >90 mL/min (ref >90)
GFR calc non Af Amer: >90 mL/min (ref >90)
GLUCOSE: 484 mg/dL — AB (ref 70–99)
POTASSIUM: 4.9 meq/L (ref 3.7–5.3)
Sodium: 135 mEq/L — ABNORMAL LOW (ref 137–147)

## 2013-09-05 LAB — HEMOGLOBIN AND HEMATOCRIT, BLOOD
HEMATOCRIT: 38.5 % — AB (ref 39.0–52.0)
HEMOGLOBIN: 13.4 g/dL (ref 13.0–17.0)

## 2013-09-05 NOTE — Pre-Procedure Instructions (Signed)
Patient given information to sign up for my chart at home. 

## 2013-09-10 NOTE — OR Nursing (Signed)
Abnormal glucose oof 484 reported to Dr. Patsey Berthold.  Ordered for CBG on arrival

## 2013-09-11 ENCOUNTER — Encounter (HOSPITAL_COMMUNITY): Payer: Managed Care, Other (non HMO) | Admitting: Anesthesiology

## 2013-09-11 ENCOUNTER — Encounter (HOSPITAL_COMMUNITY): Payer: Self-pay | Admitting: *Deleted

## 2013-09-11 ENCOUNTER — Emergency Department (HOSPITAL_COMMUNITY)
Admission: EM | Admit: 2013-09-11 | Discharge: 2013-09-11 | Discharge status: Against Medical Advice | Payer: Managed Care, Other (non HMO) | Attending: Emergency Medicine | Admitting: Emergency Medicine

## 2013-09-11 ENCOUNTER — Ambulatory Visit (HOSPITAL_COMMUNITY)
Admission: RE | Admit: 2013-09-11 | Discharge: 2013-09-11 | Disposition: A | Discharge status: Home / Self Care | Payer: Managed Care, Other (non HMO) | Source: Ambulatory Visit | Attending: Internal Medicine | Admitting: Internal Medicine

## 2013-09-11 ENCOUNTER — Encounter (HOSPITAL_COMMUNITY): Payer: Self-pay | Admitting: Emergency Medicine

## 2013-09-11 ENCOUNTER — Encounter (HOSPITAL_COMMUNITY)
Admission: RE | Disposition: A | Discharge status: Home / Self Care | Payer: Self-pay | Source: Ambulatory Visit | Attending: Internal Medicine

## 2013-09-11 ENCOUNTER — Ambulatory Visit (HOSPITAL_COMMUNITY): Payer: Managed Care, Other (non HMO) | Admitting: Anesthesiology

## 2013-09-11 DIAGNOSIS — R109 Unspecified abdominal pain: Secondary | ICD-10-CM | POA: Insufficient documentation

## 2013-09-11 DIAGNOSIS — F17200 Nicotine dependence, unspecified, uncomplicated: Secondary | ICD-10-CM | POA: Insufficient documentation

## 2013-09-11 DIAGNOSIS — I10 Essential (primary) hypertension: Secondary | ICD-10-CM | POA: Insufficient documentation

## 2013-09-11 DIAGNOSIS — K219 Gastro-esophageal reflux disease without esophagitis: Secondary | ICD-10-CM | POA: Insufficient documentation

## 2013-09-11 DIAGNOSIS — E109 Type 1 diabetes mellitus without complications: Secondary | ICD-10-CM | POA: Insufficient documentation

## 2013-09-11 DIAGNOSIS — Z538 Procedure and treatment not carried out for other reasons: Secondary | ICD-10-CM

## 2013-09-11 DIAGNOSIS — Z794 Long term (current) use of insulin: Secondary | ICD-10-CM | POA: Insufficient documentation

## 2013-09-11 DIAGNOSIS — G8929 Other chronic pain: Secondary | ICD-10-CM | POA: Insufficient documentation

## 2013-09-11 DIAGNOSIS — J392 Other diseases of pharynx: Secondary | ICD-10-CM | POA: Insufficient documentation

## 2013-09-11 DIAGNOSIS — Z8739 Personal history of other diseases of the musculoskeletal system and connective tissue: Secondary | ICD-10-CM | POA: Insufficient documentation

## 2013-09-11 DIAGNOSIS — Z8711 Personal history of peptic ulcer disease: Secondary | ICD-10-CM | POA: Insufficient documentation

## 2013-09-11 DIAGNOSIS — G608 Other hereditary and idiopathic neuropathies: Secondary | ICD-10-CM | POA: Insufficient documentation

## 2013-09-11 DIAGNOSIS — R011 Cardiac murmur, unspecified: Secondary | ICD-10-CM | POA: Insufficient documentation

## 2013-09-11 DIAGNOSIS — Z79899 Other long term (current) drug therapy: Secondary | ICD-10-CM | POA: Insufficient documentation

## 2013-09-11 DIAGNOSIS — R1013 Epigastric pain: Secondary | ICD-10-CM | POA: Insufficient documentation

## 2013-09-11 HISTORY — PX: ESOPHAGOGASTRODUODENOSCOPY (EGD) WITH PROPOFOL: SHX5813

## 2013-09-11 LAB — HEMOGLOBIN A1C
Hgb A1c MFr Bld: 11.4 % — ABNORMAL HIGH (ref <5.7)
Mean Plasma Glucose: 280 mg/dL — ABNORMAL HIGH (ref <117)

## 2013-09-11 LAB — CBG MONITORING, ED: Glucose-Capillary: 397 mg/dL — ABNORMAL HIGH (ref 70–99)

## 2013-09-11 LAB — GLUCOSE, CAPILLARY
GLUCOSE-CAPILLARY: 162 mg/dL — AB (ref 70–99)
Glucose-Capillary: 199 mg/dL — ABNORMAL HIGH (ref 70–99)

## 2013-09-11 SURGERY — ESOPHAGOGASTRODUODENOSCOPY (EGD) WITH PROPOFOL
Anesthesia: Monitor Anesthesia Care

## 2013-09-11 MED ORDER — FENTANYL CITRATE 0.05 MG/ML IJ SOLN
INTRAMUSCULAR | Status: AC
  Filled 2013-09-11: qty 2

## 2013-09-11 MED ORDER — SUCCINYLCHOLINE CHLORIDE 20 MG/ML IJ SOLN
INTRAMUSCULAR | Status: AC
  Filled 2013-09-11: qty 1

## 2013-09-11 MED ORDER — FENTANYL CITRATE 0.05 MG/ML IJ SOLN
25.0000 ug | Freq: Once | INTRAMUSCULAR | Status: AC
  Administered 2013-09-11: 50 ug via INTRAVENOUS

## 2013-09-11 MED ORDER — LIDOCAINE VISCOUS 2 % MT SOLN
OROMUCOSAL | Status: AC
  Administered 2013-09-11: 3 mL via OROMUCOSAL
  Filled 2013-09-11: qty 15

## 2013-09-11 MED ORDER — PROPOFOL 10 MG/ML IV BOLUS
INTRAVENOUS | Status: AC
  Filled 2013-09-11: qty 20

## 2013-09-11 MED ORDER — MIDAZOLAM HCL 2 MG/2ML IJ SOLN
INTRAMUSCULAR | Status: AC
  Filled 2013-09-11: qty 2

## 2013-09-11 MED ORDER — LIDOCAINE VISCOUS 2 % MT SOLN
OROMUCOSAL | Status: DC
Start: 2013-09-11 — End: 2013-09-11
  Filled 2013-09-11: qty 15

## 2013-09-11 MED ORDER — MIDAZOLAM HCL 2 MG/2ML IJ SOLN
1.0000 mg | INTRAMUSCULAR | Status: DC | PRN
  Administered 2013-09-11 (×2): 2 mg via INTRAVENOUS
  Filled 2013-09-11: qty 2

## 2013-09-11 MED ORDER — PROPOFOL INFUSION 10 MG/ML OPTIME
INTRAVENOUS | Status: DC | PRN
  Administered 2013-09-11: 100 ug/kg/min via INTRAVENOUS

## 2013-09-11 MED ORDER — LACTATED RINGERS IV SOLN
INTRAVENOUS | Status: DC | PRN
  Administered 2013-09-11: 07:00:00 via INTRAVENOUS

## 2013-09-11 MED ORDER — MIDAZOLAM HCL 5 MG/5ML IJ SOLN
INTRAMUSCULAR | Status: DC | PRN
  Administered 2013-09-11 (×2): 1 mg via INTRAVENOUS

## 2013-09-11 MED ORDER — EPHEDRINE SULFATE 50 MG/ML IJ SOLN
INTRAMUSCULAR | Status: AC
  Filled 2013-09-11: qty 1

## 2013-09-11 MED ORDER — FENTANYL CITRATE 0.05 MG/ML IJ SOLN
25.0000 ug | INTRAMUSCULAR | Status: AC
  Administered 2013-09-11 (×2): 25 ug via INTRAVENOUS

## 2013-09-11 MED ORDER — SODIUM CHLORIDE 0.9 % IJ SOLN
INTRAMUSCULAR | Status: AC
  Filled 2013-09-11: qty 10

## 2013-09-11 MED ORDER — ONDANSETRON HCL 4 MG/2ML IJ SOLN
INTRAMUSCULAR | Status: AC
  Filled 2013-09-11: qty 2

## 2013-09-11 MED ORDER — SODIUM CHLORIDE 0.9 % IV BOLUS (SEPSIS): 1000.0000 mL | Freq: Once | INTRAVENOUS | Status: DC

## 2013-09-11 MED ORDER — GI COCKTAIL ~~LOC~~
30.0000 mL | Freq: Once | ORAL | Status: AC
  Filled 2013-09-11: qty 30

## 2013-09-11 MED ORDER — ONDANSETRON HCL 4 MG/2ML IJ SOLN: 4.0000 mg | Freq: Once | INTRAMUSCULAR | Status: DC | PRN

## 2013-09-11 MED ORDER — PROMETHAZINE HCL 25 MG/ML IJ SOLN
12.5000 mg | Freq: Once | INTRAMUSCULAR | Status: AC
  Administered 2013-09-11: 12.5 mg via INTRAVENOUS

## 2013-09-11 MED ORDER — PROMETHAZINE HCL 25 MG/ML IJ SOLN
INTRAMUSCULAR | Status: AC
  Filled 2013-09-11: qty 1

## 2013-09-11 MED ORDER — FENTANYL CITRATE 0.05 MG/ML IJ SOLN
25.0000 ug | INTRAMUSCULAR | Status: DC | PRN
  Administered 2013-09-11 (×2): 25 ug via INTRAVENOUS
  Filled 2013-09-11: qty 2

## 2013-09-11 MED ORDER — LACTATED RINGERS IV SOLN
INTRAVENOUS | Status: DC
  Administered 2013-09-11: 07:00:00 via INTRAVENOUS

## 2013-09-11 MED ORDER — ONDANSETRON HCL 4 MG/2ML IJ SOLN
4.0000 mg | Freq: Once | INTRAMUSCULAR | Status: AC
  Administered 2013-09-11: 4 mg via INTRAVENOUS

## 2013-09-11 MED ORDER — LIDOCAINE HCL (PF) 1 % IJ SOLN
INTRAMUSCULAR | Status: AC
  Filled 2013-09-11: qty 5

## 2013-09-11 MED ORDER — LIDOCAINE HCL (CARDIAC) 10 MG/ML IV SOLN
INTRAVENOUS | Status: DC | PRN
  Administered 2013-09-11: 50 mg via INTRAVENOUS

## 2013-09-11 MED ORDER — FENTANYL CITRATE 0.05 MG/ML IJ SOLN
INTRAMUSCULAR | Status: DC | PRN
  Administered 2013-09-11 (×2): 50 ug via INTRAVENOUS

## 2013-09-11 MED ORDER — LIDOCAINE VISCOUS 2 % MT SOLN
3.0000 mL | Freq: Once | OROMUCOSAL | Status: AC
  Administered 2013-09-11: 3 mL via OROMUCOSAL
  Filled 2013-09-11: qty 5

## 2013-09-11 SURGICAL SUPPLY — 21 items
BLOCK BITE 60FR ADLT L/F BLUE (MISCELLANEOUS) ×2 IMPLANT
DEVICE CLIP HEMOSTAT 235CM (CLIP) IMPLANT
ELECT REM PT RETURN 9FT ADLT (ELECTROSURGICAL)
ELECTRODE REM PT RTRN 9FT ADLT (ELECTROSURGICAL) IMPLANT
FLOOR PAD 36X40 (MISCELLANEOUS) ×3
FORCEPS BIOP RAD 4 LRG CAP 4 (CUTTING FORCEPS) IMPLANT
FORMALIN 10 PREFIL 20ML (MISCELLANEOUS) IMPLANT
KIT CLEAN ENDO COMPLIANCE (KITS) ×2 IMPLANT
MANIFOLD NEPTUNE II (INSTRUMENTS) ×2 IMPLANT
NDL SCLEROTHERAPY 25GX240 (NEEDLE) IMPLANT
NEEDLE SCLEROTHERAPY 25GX240 (NEEDLE) IMPLANT
PAD FLOOR 36X40 (MISCELLANEOUS) IMPLANT
PROBE APC STR FIRE (PROBE) IMPLANT
PROBE INJECTION GOLD (MISCELLANEOUS)
PROBE INJECTION GOLD 7FR (MISCELLANEOUS) IMPLANT
SNARE ROTATE MED OVAL 20MM (MISCELLANEOUS) IMPLANT
SNARE SHORT THROW 13M SML OVAL (MISCELLANEOUS) ×1 IMPLANT
SYR 50ML LL SCALE MARK (SYRINGE) ×2 IMPLANT
SYR INFLATION 60ML (SYRINGE) ×1 IMPLANT
TUBING IRRIGATION ENDOGATOR (MISCELLANEOUS) ×2 IMPLANT
WATER STERILE IRR 1000ML POUR (IV SOLUTION) ×2 IMPLANT

## 2013-09-11 NOTE — H&P (View-Only) (Signed)
Referring Provider: Celedonio Savage, MD Primary Care Physician:  No primary provider on file. Primary GI: Dr. [name]   Chief Complaint  Patient presents with  . Follow-up    HPI:   Brent Daniels presents today in hospital follow-up after admission for DKA, hematemesis. EGD performed with 2 gastric ulcers, mild antral gastritis, candida esophagitis. Treated with Diflucan. On Protonix BID, Carafate QID. Constant abdominal pain. Epigastric location. Will ache and throb, sometimes constant pain. Will have vomiting spells every now and then. Eating makes it better if can keep it down. Gallbladder remains in situ. If reflux really bad, can't swallow well. Doesn't feel constipation. States maybe twice a week. Baseline for him. Oxycodone only thing that helps. Tried/failed Nexium. Failed Prilosec. Admitted to sparse use of NSAIDs during hospitalization. Adamantly denies any currently.   Past Medical History  Diagnosis Date  . Heart murmur   . Diabetes mellitus     type 1  . Arthritis   . Back pain   . GERD (gastroesophageal reflux disease)   . PUD (peptic ulcer disease)   . Hypertension     Past Surgical History  Procedure Laterality Date  . Tympanostomy tube placement    . Knee arthroscopy  06/30/2011    Procedure: ARTHROSCOPY KNEE;  Surgeon: Carole Civil, MD;  Location: [facility];  Service: Orthopedics;  Laterality: Right;  diagnostic arthroscopy  . Esophagogastroduodenoscopy (egd) with propofol  06/17/2013    Dr. Oneida Alar: two gastric ulcers in fundus, mild antral gastritis, candida esophagitis  . Esophageal biopsy  06/17/2013    Procedure: GASTRIC ULCER AND ANTRAL BIOPSIES; ESOPHAGEAL BRUSHING;  Surgeon: Danie Binder, MD;  Location: [facility];  Service: Endoscopy;;    Current Outpatient Prescriptions  Medication Sig Dispense Refill  . gabapentin (NEURONTIN) 600 MG tablet Take 1 tablet (600 mg total) by mouth 3 (three) times daily.  90 tablet  0  . insulin NPH-regular Human  (NOVOLIN 70/30) (70-30) 100 UNIT/ML injection Inject 25 Units into the skin 2 (two) times daily with a meal. Patient takes 15 units in the morning and 25 units at bedtime  10 mL  3  . insulin regular (HUMULIN R,NOVOLIN R) 100 units/mL injection Inject into the skin as directed. Sliding Scale. Inject required units based on Blood Glucose levels up to 5 times daily with meals.      Marland Kitchen lisinopril (PRINIVIL,ZESTRIL) 20 MG tablet Take 20 mg by mouth daily.      . nicotine (NICODERM CQ - DOSED IN MG/24 HOURS) 21 mg/24hr patch Place 1 patch (21 mg total) onto the skin daily.  28 patch  0  . oxyCODONE (ROXICODONE) 5 MG immediate release tablet Take 1 tablet (5 mg total) by mouth every 8 (eight) hours as needed for severe pain.  40 tablet  0  . pantoprazole (PROTONIX) 40 MG tablet Take 1 tablet (40 mg total) by mouth 2 (two) times daily before a meal.  60 tablet  2  . sucralfate (CARAFATE) 1 GM/10ML suspension Take 10 mLs (1 g total) by mouth 4 (four) times daily -  with meals and at bedtime.  420 mL  1  . traMADol (ULTRAM) 50 MG tablet Take 50 mg by mouth every 6 (six) hours as needed. pain      . dexlansoprazole (DEXILANT) 60 MG capsule Take 1 capsule (60 mg total) by mouth 2 (two) times daily.  60 capsule  3  . HYDROcodone-acetaminophen (NORCO) 5-325 MG per tablet Take 1 tablet  by mouth every 6 (six) hours as needed for moderate pain.  30 tablet  0   No current facility-administered medications for this visit.    Allergies as of 08/19/2013 - Review Complete 08/19/2013  Allergen Reaction Noted  . Ibuprofen Other (See Comments) 08/14/2011  . Naproxen Other (See Comments) 08/14/2011  . Sulfa antibiotics Other (See Comments) 08/14/2011  . Morphine and related Rash 01/04/2012    Family History  Problem Relation Age of Onset  . Cancer Father   . Alcohol abuse Father   . Pseudochol deficiency Neg Hx   . Malignant hyperthermia Neg Hx   . Hypotension Neg Hx   . Anesthesia problems Neg Hx   . Colon  cancer Neg Hx     History   Social History  . Marital Status: Single    Spouse Name: N/A    Number of Children: N/A  . Years of Education: 12   Occupational History  . unemployed     trying to get disability   Social History Main Topics  . Smoking status: Current Every Day Smoker -- 0.50 packs/day for 10 years    Types: Cigarettes  . Smokeless tobacco: Former Systems developer    Quit date: 09/17/2000     Comment: smokes 4 cigarettes a day   . Alcohol Use: No  . Drug Use: No  . Sexual Activity: None   Other Topics Concern  . None   Social History Narrative  . None    Review of Systems: As mentioned in HPI  Physical Exam: BP 120/85  Pulse 94  Temp(Src) 97.6 F (36.4 C) (Oral)  Resp 20  Ht 6\' 1"  (1.854 m)  Wt 172 lb (78.019 kg)  BMI 22.70 kg/m2 General:   Alert and oriented. No distress noted. Pleasant and cooperative.  Head:  Normocephalic and atraumatic. Eyes:  Conjuctiva clear without scleral icterus. Mouth:  Oral mucosa pink and moist. Good dentition. No lesions. Neck:  Supple, without mass or thyromegaly. Heart:  S1, S2 present without murmurs, rubs, or gallops. Regular rate and rhythm. Abdomen:  +BS, soft, mild discomfort with palpation diffusely but without peritoneal signs. non-distended. No rebound or guarding.  Extremities:  Without edema. Neurologic:  Alert and  oriented x4;  grossly normal neurologically. Skin:  Intact without significant lesions or rashes. Psych:  Alert and cooperative. Normal mood and affect.  Lab Results  Component Value Date   WBC 9.3 06/18/2013   HGB 10.9* 06/18/2013   HCT 32.5* 06/18/2013   MCV 91.8 06/18/2013   PLT 254 06/18/2013   Lab Results  Component Value Date   ALT 18 07/07/2013   AST 18 07/07/2013   ALKPHOS 115 07/07/2013   BILITOT 0.5 07/07/2013   Lab Results  Component Value Date   LIPASE <10 07/07/2013

## 2013-09-11 NOTE — ED Provider Notes (Signed)
This chart was scribed for [place], DO by Erling Conte, ED Scribe. This patient was seen in room [facility]  TIME SEEN: 10:20 AM   CHIEF COMPLAINT:  Chief Complaint  Patient presents with  . Abdominal Pain     HPI:  Brent Daniels is a 28 y.o. male with a h/o type 1 DM, arthritis, GERD, PUD, HTN, valvular stenosis, and neuropathy who presents to the Emergency Department complaining of generalized,  non radiating, "throbbing", "aching", "10/10" abdominal pain.  He states he has had associated chills, nausea and emesis due to a current stomach ulcer that he reports his been going on for 5 years. Patient states he was admitted 3 months ago for a stomach ulcer. Pt was seen in endo this morning with Dr. [name] and placed under general anesthesia but unable to complete the procedure because was told his stomach was too full. Patient was confused as to why the procedure could not be completed because he states his last meal was at 9:00 PM last night. Patient has DM and is insulin dependent. States that he monitors his blood sugars and they have been "good."   Patient states that he has taken oxycodone in the past for this pain. He states that Dr. [name] did not prescribe any pain medication nor will his primary care provider and that is why he is in the ER. He states he currently takes Protonix and Carafate. He denies any fever or diarrhea.  PCP: Dr. Talbert Forest Corrington   ROS: See HPI Constitutional: no fever. Positive for chills Eyes: no drainage  ENT: no runny nose   Cardiovascular:  no chest pain  Resp: no SOB  GI: no diarrhea. Positive for generalized abdominal pain or emesis.  GU: no dysuria Integumentary: no rash  Allergy: no hives  Musculoskeletal: no leg swelling  Neurological: no slurred speech ROS otherwise negative  PAST MEDICAL HISTORY/PAST SURGICAL HISTORY:  Past Medical History  Diagnosis Date  . Diabetes mellitus     type 1  . Arthritis   . Back pain   . GERD  (gastroesophageal reflux disease)   . PUD (peptic ulcer disease)   . Hypertension   . Heart murmur     valvular stenosis  . Neuropathy     MEDICATIONS:  Prior to Admission medications   Medication Sig Start Date End Date Taking? Authorizing Provider  gabapentin (NEURONTIN) 600 MG tablet Take 1 tablet (600 mg total) by mouth 3 (three) times daily. 06/18/13   Barton Dubois, MD  insulin NPH-regular Human (NOVOLIN 70/30) (70-30) 100 UNIT/ML injection Inject 25 Units into the skin 2 (two) times daily with a meal. Patient takes 15 units in the morning and 25 units at bedtime 06/18/13   Barton Dubois, MD  insulin regular (HUMULIN R,NOVOLIN R) 100 units/mL injection Inject into the skin as directed. Sliding Scale. Inject required units based on Blood Glucose levels up to 5 times daily with meals.    Historical Provider, MD  lisinopril (PRINIVIL,ZESTRIL) 20 MG tablet Take 20 mg by mouth daily.    Historical Provider, MD  oxyCODONE (ROXICODONE) 5 MG immediate release tablet Take 1 tablet (5 mg total) by mouth every 4 (four) hours as needed for severe pain. [id number]   Delora Fuel, MD  pantoprazole (PROTONIX) 40 MG tablet Take 1 tablet (40 mg total) by mouth 2 (two) times daily before a meal. 06/18/13   Barton Dubois, MD  promethazine (PHENERGAN) 25 MG tablet Take 1 tablet (25 mg total) by mouth  every 6 (six) hours as needed for nausea or vomiting. [id number]   Delora Fuel, MD  sucralfate (CARAFATE) 1 GM/10ML suspension Take 10 mLs (1 g total) by mouth 4 (four) times daily -  with meals and at bedtime. 07/03/13   Orvil Feil, NP  tiZANidine (ZANAFLEX) 4 MG tablet Take 1 tablet by mouth every 8 (eight) hours as needed for muscle spasms.  07/30/13   Historical Provider, MD  traMADol (ULTRAM) 50 MG tablet Take 50 mg by mouth every 6 (six) hours as needed. pain    Historical Provider, MD    ALLERGIES:  Allergies  Allergen Reactions  . Hydrocodone Hives  . Ibuprofen Other (See Comments)    Stomach ulcers  .  Naproxen Other (See Comments)    Stomach ulcers  . Sulfa Antibiotics Other (See Comments)    Stomach ulcers  . Morphine And Related Rash    SOCIAL HISTORY:  History  Substance Use Topics  . Smoking status: Current Every Day Smoker -- 0.50 packs/day for 10 years    Types: Cigarettes  . Smokeless tobacco: Former Systems developer    Quit date: 02/17/2013     Comment: smokes 4 cigarettes a day   . Alcohol Use: No    FAMILY HISTORY: Family History  Problem Relation Age of Onset  . Cancer Father   . Alcohol abuse Father   . Pseudochol deficiency Neg Hx   . Malignant hyperthermia Neg Hx   . Hypotension Neg Hx   . Anesthesia problems Neg Hx   . Colon cancer Neg Hx     EXAM:  Triage Vitals: BP 146/103  Pulse 99  Temp(Src) 97.7 F (36.5 C)  Resp 18  Ht 6\' 2"  (1.88 m)  Wt 179 lb (81.194 kg)  BMI 22.97 kg/m2  SpO2 99%  CONSTITUTIONAL: Alert and oriented and responds appropriately to questions. Well-appearing; well-nourished HEAD: Normocephalic EYES: Conjunctivae clear, PERRL ENT: normal nose; no rhinorrhea; moist mucous membranes; pharynx without lesions noted NECK: Supple, no meningismus, no LAD  CARD: RRR; S1 and S2 appreciated; no murmurs, no clicks, no rubs, no gallops RESP: Normal chest excursion without splinting or tachypnea; breath sounds clear and equal bilaterally; no wheezes, no rhonchi, no rales,  ABD/GI: Normal bowel sounds; non-distended; soft, when I auscultate patient's abdomen with a stethoscope I palpated his abdomen deeply without any pain. When I removed the stethoscope and palpate with my hand, patient complains of pain diffusely and is voluntarily guarding. BACK:  The back appears normal and is non-tender to palpation, there is no CVA tenderness EXT: Normal ROM in all joints; non-tender to palpation; no edema; normal capillary refill; no cyanosis    SKIN: Normal color for age and race; warm NEURO: Moves all extremities equally PSYCH: The patient's mood and manner  are appropriate. Grooming and personal hygiene are appropriate.  MEDICAL DECISION MAKING: Patient here with complaints of chronic pain. He has had history of antral gastritis, peptic ulcers and esophagitis that has been bothering him for 5 years. Discussed with patient that I do not feel it is appropriate to treat his chronic pain from the emergency department. He was told by both his primary care physician and GI but they also do not feel is appropriate to treat him with narcotic pain medication that is why he came to the ED. He states "the last doctor gave me 30 tablets of Percocet". He states he is out of this medication and would like more. When he is distracted, his abdominal exam is  completely benign. Have discussed with patient that I will contact his gastroenterologist given he did have an EGD this morning and discuss pain management options. Have offered GI cocktail which patient has refused. We'll check CBG.  ED PROGRESS: Patient's CBG is 397. Have recommended placing and ID, checking labs, giving IV fluids. Have placed a page to Dr. [name] with gastroenterology. Prior to discussing with gastroenterology or obtaining labs, patient left the emergency department [place]. He did not tell anyone he was leaving and I was unable to talk to him further and recommend further treatment for his hyperglycemia.     [place], DO 09/11/13 1047

## 2013-09-11 NOTE — ED Notes (Signed)
Pt has stomach ulcer. Pt seen in Endo (and placed under general anesthesia) this am for proceduce but unable to complete d/t still having food in stomach. Pt states the GI doctor would not prescribe him any pain medication for his ulcer so he came to ED.

## 2013-09-11 NOTE — Interval H&P Note (Signed)
History and Physical Interval Note:  09/11/2013 7:37 AM  Brent Daniels  has presented today for surgery, with the diagnosis of ESOPHAGEAL PAIN  The various methods of treatment have been discussed with the patient and family. After consideration of risks, benefits and other options for treatment, the patient has consented to  Procedure(s): ESOPHAGOGASTRODUODENOSCOPY (EGD) WITH PROPOFOL (N/A) as a surgical intervention .  The patient's history has been reviewed, patient examined, no change in status, stable for surgery.  I have reviewed the patient's chart and labs.  Questions were answered to the patient's satisfaction.     No change. CT and labs okay except for elevated blood sugar. EGD per plan.The risks, benefits, limitations, alternatives and imponderables have been reviewed with the patient. Potential for esophageal dilation, biopsy, etc. have also been reviewed.  Questions have been answered. All parties agreeable.   Manus Rudd

## 2013-09-11 NOTE — OR Nursing (Signed)
Upon arrival to post op, patient was very agitated, stating that his abdominal pain was 10/10.  Was hyper focused on the fact that he had no remaining pain medication at home.  No script provided for pain medication per MD, as patient has gastroparesis and has been encouraged not to take pain medication unless absolutely necessary.  Order obtained for 1 dose of fentanyl 50 mcg IV and Phenergan 12.5 mg IV from Dr Patsey Berthold.  Medication given as ordered, however patient states that he did not receive relief from medication and would be going to the ER to get a prescription.  Patients aunt Malachy Mood, made aware of situation and discharge instructions given to both of them, clearly outlining MD recommendations.

## 2013-09-11 NOTE — Op Note (Signed)
[facility] [address] [place], [address]   ENDOSCOPY PROCEDURE REPORT  PATIENT: Brent Daniels, Brent Daniels  MR#: [id number] BIRTHDATE: 12-08-1985 , 28  yrs. old GENDER: Male ENDOSCOPIST: R.  Garfield Cornea, MD [facility] REFERRED BY:     Dr. Neta Mends PROCEDURE DATE:  09/11/2013 PROCEDURE:     EGD-diagnostic-incomplete  INDICATIONS:     History of peptic ulcer disease; persisting abdominal pain. Negative CT LFTs and serum lipase recently. Poorly controlled blood sugar  INFORMED CONSENT:   The risks, benefits, limitations, alternatives and imponderables have been discussed.  The potential for biopsy, esophogeal dilation, etc. have also been reviewed.  Questions have been answered.  All parties agreeable.  Please see the history and physical in the medical record for more information.  MEDICATIONS: Deep sedation per Dr. [facility]  DESCRIPTION OF PROCEDURE:   The     endoscope was introduced through the mouth and advanced to the second portion of the duodenum without difficulty or limitations.  The mucosal surfaces were surveyed very carefully during advancement of the scope and upon withdrawal.  Retroflexion view of the proximal stomach and esophagogastric junction was performed.      FINDINGS:   Abnormal hypopharynx with what appears to be massively enlarged tonsils encroaching on the upper airway. Please see photos. Normal esophagus. Stomach full of food which precluded complete examination today. I was able to determine the pyloric channel was patent; it was easily traversed with the scope.  The first and second portion of the duodenum appeared normal. Stomach full of food. Gastric mucosa not completely seen. Examination terminated for this reason.  THERAPEUTIC / DIAGNOSTIC MANEUVERS PERFORMED:  none   COMPLICATIONS:  None  IMPRESSION:  Abnormal hypopharynx. Query massively enlarged tonsils. Normal esophagus. Stomach full of food precluded  complete examination. Patent pylorus. I suspect gastroparesis in the setting of very poorly controlled blood sugars.  RECOMMENDATIONS:    Continued Protonix 40 mg twice daily. Avoid narcotics. Patient is to stop Carafate. Minimize narcotics. Begin Reglan 5 mg a.c. and at bedtime-discussed side effects with family members. Gastroparesis diet. Patient should be referred to an ENT physician by their primary care provider. Office visit with [place] in one month. Will need a repeat EGD. Hemoglobin A1c today    _______________________________ R. Garfield Cornea, MD FACP [facility] eSigned:  R. Garfield Cornea, MD FACP [facility] 09/11/2013 8:19 AM     CC:  PATIENT NAME:  Brent Daniels, Brent Daniels MR#: [id number]

## 2013-09-11 NOTE — ED Notes (Signed)
Pt refused GI cocktail, stating it would not help. Attempted to explain the benefit of the GI cocktail but pt still refused. Explained to patient that labs were ordered and fluids in attempts to decrease glucose level. Pt refused and stated he needed something for pain and walked out, refusing to sign AMA, which was witnessed by Marcille Buffy, RN

## 2013-09-11 NOTE — Anesthesia Procedure Notes (Signed)
Procedure Name: MAC Date/Time: 09/11/2013 7:41 AM Performed by: Andree Elk, Keylor Rands A Pre-anesthesia Checklist: Patient identified, Timeout performed, Emergency Drugs available, Suction available and Patient being monitored Oxygen Delivery Method: Simple face mask

## 2013-09-11 NOTE — Anesthesia Postprocedure Evaluation (Signed)
  Anesthesia Post-op Note  Patient: Brent Daniels  Procedure(s) Performed: Procedure(s): ESOPHAGOGASTRODUODENOSCOPY (EGD) WITH PROPOFOL (N/A)  Patient Location: PACU  Anesthesia Type:MAC  Level of Consciousness: awake, alert , oriented and patient cooperative  Airway and Oxygen Therapy: Patient Spontanous Breathing and Patient connected to face mask oxygen  Post-op Pain: mild  Post-op Assessment: Post-op Vital signs reviewed, Patient's Cardiovascular Status Stable, Respiratory Function Stable, Patent Airway, No signs of Nausea or vomiting and Pain level controlled  Post-op Vital Signs: Reviewed and stable  Last Vitals:  Filed Vitals:   09/11/13 0740  BP: 125/88  Temp:   Resp: 20    Complications: No apparent anesthesia complications

## 2013-09-11 NOTE — Anesthesia Preprocedure Evaluation (Signed)
Anesthesia Evaluation  Patient identified by MRN, date of birth, ID band Patient awake    Reviewed: Allergy & Precautions, H&P , NPO status , Patient's Chart, lab work & pertinent test results  Airway Mallampati: III TM Distance: >3 FB Neck ROM: Full    Dental  (+) Teeth Intact   Pulmonary Current Smoker,  breath sounds clear to auscultation        Cardiovascular hypertension, Pt. on medications Rhythm:Regular Rate:Normal     Neuro/Psych    GI/Hepatic PUD, GERD-  ,  Endo/Other  diabetes, Poorly Controlled, Type 1, Insulin Dependent  Renal/GU      Musculoskeletal   Abdominal   Peds  Hematology   Anesthesia Other Findings   Reproductive/Obstetrics                           Anesthesia Physical Anesthesia Plan  ASA: III  Anesthesia Plan: MAC   Post-op Pain Management:    Induction: Intravenous  Airway Management Planned: Simple Face Mask  Additional Equipment:   Intra-op Plan:   Post-operative Plan:   Informed Consent: I have reviewed the patients History and Physical, chart, labs and discussed the procedure including the risks, benefits and alternatives for the proposed anesthesia with the patient or authorized representative who has indicated his/her understanding and acceptance.     Plan Discussed with:   Anesthesia Plan Comments:         Anesthesia Quick Evaluation

## 2013-09-11 NOTE — Transfer of Care (Signed)
Immediate Anesthesia Transfer of Care Note  Patient: Brent Daniels  Procedure(s) Performed: Procedure(s): ESOPHAGOGASTRODUODENOSCOPY (EGD) WITH PROPOFOL (N/A)  Patient Location: PACU  Anesthesia Type:MAC  Level of Consciousness: awake, alert , oriented and patient cooperative  Airway & Oxygen Therapy: Patient Spontanous Breathing and Patient connected to face mask oxygen  Post-op Assessment: Report given to PACU RN and Post -op Vital signs reviewed and stable  Post vital signs: Reviewed and stable  Complications: No apparent anesthesia complications

## 2013-09-11 NOTE — Discharge Instructions (Addendum)
EGD Discharge instructions Please read the instructions outlined below and refer to this sheet in the next few weeks. These discharge instructions provide you with general information on caring for yourself after you leave the hospital. Your doctor may also give you specific instructions. While your treatment has been planned according to the most current medical practices available, unavoidable complications occasionally occur. If you have any problems or questions after discharge, please call your doctor. ACTIVITY  You may resume your regular activity but move at a slower pace for the next 24 hours.   Take frequent rest periods for the next 24 hours.   Walking will help expel (get rid of) the air and reduce the bloated feeling in your abdomen.   No driving for 24 hours (because of the anesthesia (medicine) used during the test).   You may shower.   Do not sign any important legal documents or operate any machinery for 24 hours (because of the anesthesia used during the test).  NUTRITION  Drink plenty of fluids.   You may resume your normal diet.   Begin with a light meal and progress to your normal diet.   Avoid alcoholic beverages for 24 hours or as instructed by your caregiver.  MEDICATIONS  You may resume your normal medications unless your caregiver tells you otherwise.  WHAT YOU CAN EXPECT TODAY  You may experience abdominal discomfort such as a feeling of fullness or gas pains.  FOLLOW-UP  Your doctor will discuss the results of your test with you.  SEEK IMMEDIATE MEDICAL ATTENTION IF ANY OF THE FOLLOWING OCCUR:  Excessive nausea (feeling sick to your stomach) and/or vomiting.   Severe abdominal pain and distention (swelling).   Trouble swallowing.   Temperature over 101 F (37.8 C).   Rectal bleeding or vomiting of blood.      Hemoglobin A1c today  Continue Protonix 40 mg twice daily.  Stop Carafate  Begin Reglan 5 mg before meals and at  bedtime-discuss side effects with family members  See ENT doctor about abnormal throat, possible markedly enlarged tonsils  Office visit with [place] in one month. Will need a repeat EGD because your stomach was full of food today  Gastroparesis diet  You should really avoid all narcotics as much as possible as this will worsen or stomach emptying

## 2013-09-12 ENCOUNTER — Encounter (HOSPITAL_COMMUNITY): Payer: Self-pay | Admitting: Internal Medicine

## 2013-09-28 ENCOUNTER — Encounter (HOSPITAL_COMMUNITY): Payer: Self-pay | Admitting: Emergency Medicine

## 2013-09-28 ENCOUNTER — Inpatient Hospital Stay (HOSPITAL_COMMUNITY)
Admission: EM | Admit: 2013-09-28 | Discharge: 2013-10-01 | DRG: 638 | Disposition: A | Discharge status: Home / Self Care | Payer: Managed Care, Other (non HMO) | Attending: Internal Medicine | Admitting: Internal Medicine

## 2013-09-28 DIAGNOSIS — G8929 Other chronic pain: Secondary | ICD-10-CM

## 2013-09-28 DIAGNOSIS — D72829 Elevated white blood cell count, unspecified: Secondary | ICD-10-CM | POA: Diagnosis present

## 2013-09-28 DIAGNOSIS — E1049 Type 1 diabetes mellitus with other diabetic neurological complication: Secondary | ICD-10-CM

## 2013-09-28 DIAGNOSIS — E869 Volume depletion, unspecified: Secondary | ICD-10-CM | POA: Diagnosis present

## 2013-09-28 DIAGNOSIS — I498 Other specified cardiac arrhythmias: Secondary | ICD-10-CM | POA: Diagnosis present

## 2013-09-28 DIAGNOSIS — R51 Headache: Secondary | ICD-10-CM | POA: Diagnosis present

## 2013-09-28 DIAGNOSIS — E1142 Type 2 diabetes mellitus with diabetic polyneuropathy: Secondary | ICD-10-CM | POA: Diagnosis present

## 2013-09-28 DIAGNOSIS — Z8711 Personal history of peptic ulcer disease: Secondary | ICD-10-CM

## 2013-09-28 DIAGNOSIS — R110 Nausea: Secondary | ICD-10-CM

## 2013-09-28 DIAGNOSIS — I10 Essential (primary) hypertension: Secondary | ICD-10-CM | POA: Diagnosis present

## 2013-09-28 DIAGNOSIS — M4120 Other idiopathic scoliosis, site unspecified: Secondary | ICD-10-CM | POA: Diagnosis present

## 2013-09-28 DIAGNOSIS — E1010 Type 1 diabetes mellitus with ketoacidosis without coma: Secondary | ICD-10-CM | POA: Diagnosis not present

## 2013-09-28 DIAGNOSIS — R1115 Cyclical vomiting syndrome unrelated to migraine: Secondary | ICD-10-CM | POA: Diagnosis not present

## 2013-09-28 DIAGNOSIS — K920 Hematemesis: Secondary | ICD-10-CM

## 2013-09-28 DIAGNOSIS — D72819 Decreased white blood cell count, unspecified: Secondary | ICD-10-CM | POA: Diagnosis present

## 2013-09-28 DIAGNOSIS — K219 Gastro-esophageal reflux disease without esophagitis: Secondary | ICD-10-CM | POA: Diagnosis present

## 2013-09-28 DIAGNOSIS — R1084 Generalized abdominal pain: Secondary | ICD-10-CM

## 2013-09-28 DIAGNOSIS — Z794 Long term (current) use of insulin: Secondary | ICD-10-CM

## 2013-09-28 DIAGNOSIS — M129 Arthropathy, unspecified: Secondary | ICD-10-CM | POA: Diagnosis present

## 2013-09-28 DIAGNOSIS — K3184 Gastroparesis: Secondary | ICD-10-CM

## 2013-09-28 DIAGNOSIS — F17200 Nicotine dependence, unspecified, uncomplicated: Secondary | ICD-10-CM | POA: Diagnosis present

## 2013-09-28 DIAGNOSIS — E1165 Type 2 diabetes mellitus with hyperglycemia: Secondary | ICD-10-CM

## 2013-09-28 DIAGNOSIS — E1043 Type 1 diabetes mellitus with diabetic autonomic (poly)neuropathy: Secondary | ICD-10-CM

## 2013-09-28 DIAGNOSIS — R000 Tachycardia, unspecified: Secondary | ICD-10-CM | POA: Diagnosis present

## 2013-09-28 DIAGNOSIS — R1013 Epigastric pain: Secondary | ICD-10-CM

## 2013-09-28 DIAGNOSIS — E1065 Type 1 diabetes mellitus with hyperglycemia: Secondary | ICD-10-CM | POA: Diagnosis present

## 2013-09-28 DIAGNOSIS — R1110 Vomiting, unspecified: Secondary | ICD-10-CM

## 2013-09-28 DIAGNOSIS — M549 Dorsalgia, unspecified: Secondary | ICD-10-CM | POA: Diagnosis present

## 2013-09-28 DIAGNOSIS — R112 Nausea with vomiting, unspecified: Secondary | ICD-10-CM | POA: Diagnosis present

## 2013-09-28 DIAGNOSIS — E1140 Type 2 diabetes mellitus with diabetic neuropathy, unspecified: Secondary | ICD-10-CM | POA: Diagnosis present

## 2013-09-28 DIAGNOSIS — IMO0002 Reserved for concepts with insufficient information to code with codable children: Secondary | ICD-10-CM

## 2013-09-28 HISTORY — DX: Other specified postprocedural states: Z98.890

## 2013-09-28 HISTORY — DX: Gastroparesis: K31.84

## 2013-09-28 HISTORY — DX: Scoliosis, unspecified: M41.9

## 2013-09-28 HISTORY — DX: Type 1 diabetes mellitus with diabetic autonomic (poly)neuropathy: E10.43

## 2013-09-28 LAB — CBC WITH DIFFERENTIAL/PLATELET
Basophils Absolute: 0.1 10*3/uL (ref 0.0–0.1)
Basophils Relative: 1 % (ref 0–1)
Eosinophils Absolute: 0.2 10*3/uL (ref 0.0–0.7)
Eosinophils Relative: 2 % (ref 0–5)
HEMATOCRIT: 48.5 % (ref 39.0–52.0)
Hemoglobin: 16.7 g/dL (ref 13.0–17.0)
LYMPHS ABS: 2.6 10*3/uL (ref 0.7–4.0)
LYMPHS PCT: 17 % (ref 12–46)
MCH: 31 pg (ref 26.0–34.0)
MCHC: 34.4 g/dL (ref 30.0–36.0)
MCV: 90.1 fL (ref 78.0–100.0)
Monocytes Absolute: 0.6 10*3/uL (ref 0.1–1.0)
Monocytes Relative: 4 % (ref 3–12)
Neutro Abs: 12 10*3/uL — ABNORMAL HIGH (ref 1.7–7.7)
Neutrophils Relative %: 76 % (ref 43–77)
Platelets: 357 10*3/uL (ref 150–400)
RBC: 5.38 MIL/uL (ref 4.22–5.81)
RDW: 14 % (ref 11.5–15.5)
WBC: 15.6 10*3/uL — AB (ref 4.0–10.5)

## 2013-09-28 LAB — BASIC METABOLIC PANEL
Anion gap: 12 (ref 5–15)
Anion gap: 18 — ABNORMAL HIGH (ref 5–15)
BUN: 13 mg/dL (ref 6–23)
BUN: 12 mg/dL (ref 6–23)
CALCIUM: 9.4 mg/dL (ref 8.4–10.5)
CO2: 31 meq/L (ref 19–32)
CO2: 25 mEq/L (ref 19–32)
CREATININE: 0.84 mg/dL (ref 0.50–1.35)
CREATININE: 0.71 mg/dL (ref 0.50–1.35)
Calcium: 9.6 mg/dL (ref 8.4–10.5)
Chloride: 96 mEq/L (ref 96–112)
Chloride: 96 mEq/L (ref 96–112)
GFR calc Af Amer: >90 mL/min (ref >90)
GFR calc non Af Amer: >90 mL/min (ref >90)
Glucose, Bld: 273 mg/dL — ABNORMAL HIGH (ref 70–99)
Glucose, Bld: 251 mg/dL — ABNORMAL HIGH (ref 70–99)
POTASSIUM: 4.2 meq/L (ref 3.7–5.3)
Potassium: 4.7 mEq/L (ref 3.7–5.3)
Sodium: 139 mEq/L (ref 137–147)
Sodium: 139 mEq/L (ref 137–147)

## 2013-09-28 LAB — COMPREHENSIVE METABOLIC PANEL
ALT: 20 U/L (ref 0–53)
AST: 20 U/L (ref 0–37)
Albumin: 5.2 g/dL (ref 3.5–5.2)
Alkaline Phosphatase: 111 U/L (ref 39–117)
Anion gap: 21 — ABNORMAL HIGH (ref 5–15)
BILIRUBIN TOTAL: 1.8 mg/dL — AB (ref 0.3–1.2)
BUN: 15 mg/dL (ref 6–23)
CHLORIDE: 87 meq/L — AB (ref 96–112)
CO2: 29 meq/L (ref 19–32)
CREATININE: 0.9 mg/dL (ref 0.50–1.35)
Calcium: 10.8 mg/dL — ABNORMAL HIGH (ref 8.4–10.5)
GFR calc Af Amer: >90 mL/min (ref >90)
Glucose, Bld: 444 mg/dL — ABNORMAL HIGH (ref 70–99)
POTASSIUM: 4 meq/L (ref 3.7–5.3)
Sodium: 137 mEq/L (ref 137–147)
Total Protein: 9 g/dL — ABNORMAL HIGH (ref 6.0–8.3)

## 2013-09-28 LAB — GLUCOSE, CAPILLARY
GLUCOSE-CAPILLARY: 157 mg/dL — AB (ref 70–99)
GLUCOSE-CAPILLARY: 224 mg/dL — AB (ref 70–99)
GLUCOSE-CAPILLARY: 249 mg/dL — AB (ref 70–99)
Glucose-Capillary: 192 mg/dL — ABNORMAL HIGH (ref 70–99)
Glucose-Capillary: 196 mg/dL — ABNORMAL HIGH (ref 70–99)

## 2013-09-28 LAB — LIPASE, BLOOD: LIPASE: 10 U/L — AB (ref 11–59)

## 2013-09-28 LAB — URINALYSIS, ROUTINE W REFLEX MICROSCOPIC
Bilirubin Urine: NEGATIVE
HGB URINE DIPSTICK: NEGATIVE
KETONES UR: 40 mg/dL — AB
LEUKOCYTES UA: NEGATIVE
Nitrite: NEGATIVE
PH: 6 (ref 5.0–8.0)
PROTEIN: NEGATIVE mg/dL
Specific Gravity, Urine: <1.005 — ABNORMAL LOW (ref 1.005–1.030)
Urobilinogen, UA: 0.2 mg/dL (ref 0.0–1.0)

## 2013-09-28 LAB — MRSA PCR SCREENING: MRSA by PCR: NEGATIVE

## 2013-09-28 LAB — CBG MONITORING, ED
Glucose-Capillary: 419 mg/dL — ABNORMAL HIGH (ref 70–99)
Glucose-Capillary: 271 mg/dL — ABNORMAL HIGH (ref 70–99)

## 2013-09-28 LAB — URINE MICROSCOPIC-ADD ON

## 2013-09-28 MED ORDER — METOCLOPRAMIDE HCL 5 MG/ML IJ SOLN
10.0000 mg | Freq: Once | INTRAMUSCULAR | Status: AC
  Administered 2013-09-28: 10 mg via INTRAVENOUS
  Filled 2013-09-28: qty 2

## 2013-09-28 MED ORDER — ACETAMINOPHEN 650 MG RE SUPP: 650.0000 mg | Freq: Four times a day (QID) | RECTAL | Status: DC | PRN

## 2013-09-28 MED ORDER — FENTANYL CITRATE 0.05 MG/ML IJ SOLN
100.0000 ug | Freq: Once | INTRAMUSCULAR | Status: AC
  Administered 2013-09-28: 100 ug via INTRAVENOUS
  Filled 2013-09-28: qty 2

## 2013-09-28 MED ORDER — ONDANSETRON HCL 4 MG/2ML IJ SOLN
4.0000 mg | Freq: Once | INTRAMUSCULAR | Status: AC
  Administered 2013-09-28: 4 mg via INTRAVENOUS
  Filled 2013-09-28: qty 2

## 2013-09-28 MED ORDER — ACETAMINOPHEN 325 MG PO TABS: 650.0000 mg | ORAL_TABLET | Freq: Four times a day (QID) | ORAL | Status: DC | PRN

## 2013-09-28 MED ORDER — METOCLOPRAMIDE HCL 5 MG/ML IJ SOLN
5.0000 mg | Freq: Four times a day (QID) | INTRAMUSCULAR | Status: DC
  Administered 2013-09-28 – 2013-09-30 (×7): 5 mg via INTRAVENOUS
  Filled 2013-09-28 (×7): qty 2

## 2013-09-28 MED ORDER — PROMETHAZINE HCL 25 MG/ML IJ SOLN
25.0000 mg | Freq: Once | INTRAMUSCULAR | Status: AC
  Administered 2013-09-28: 25 mg via INTRAVENOUS
  Filled 2013-09-28: qty 1

## 2013-09-28 MED ORDER — INSULIN GLARGINE 100 UNIT/ML ~~LOC~~ SOLN
15.0000 [IU] | Freq: Once | SUBCUTANEOUS | Status: AC
  Administered 2013-09-28: 15 [IU] via SUBCUTANEOUS
  Filled 2013-09-28: qty 0.15

## 2013-09-28 MED ORDER — PROMETHAZINE HCL 25 MG/ML IJ SOLN
12.5000 mg | Freq: Four times a day (QID) | INTRAMUSCULAR | Status: DC | PRN
  Administered 2013-09-28 – 2013-09-30 (×4): 12.5 mg via INTRAVENOUS
  Filled 2013-09-28 (×4): qty 1

## 2013-09-28 MED ORDER — CLONIDINE HCL 0.1 MG/24HR TD PTWK
0.1000 mg | MEDICATED_PATCH | TRANSDERMAL | Status: DC
  Administered 2013-09-28: 0.1 mg via TRANSDERMAL
  Filled 2013-09-28: qty 1

## 2013-09-28 MED ORDER — ONDANSETRON HCL 4 MG/2ML IJ SOLN
4.0000 mg | Freq: Once | INTRAMUSCULAR | Status: DC
  Filled 2013-09-28: qty 2

## 2013-09-28 MED ORDER — ALBUTEROL SULFATE (2.5 MG/3ML) 0.083% IN NEBU: 2.5000 mg | INHALATION_SOLUTION | RESPIRATORY_TRACT | Status: DC | PRN

## 2013-09-28 MED ORDER — PANTOPRAZOLE SODIUM 40 MG IV SOLR
40.0000 mg | Freq: Two times a day (BID) | INTRAVENOUS | Status: DC
  Administered 2013-09-28 – 2013-09-30 (×4): 40 mg via INTRAVENOUS
  Filled 2013-09-28 (×4): qty 40

## 2013-09-28 MED ORDER — HYDROMORPHONE HCL PF 1 MG/ML IJ SOLN
1.0000 mg | INTRAMUSCULAR | Status: DC | PRN
  Administered 2013-09-28 – 2013-09-29 (×5): 1 mg via INTRAVENOUS
  Filled 2013-09-28 (×5): qty 1

## 2013-09-28 MED ORDER — DEXTROSE-NACL 5-0.45 % IV SOLN
INTRAVENOUS | Status: DC
Start: 2013-09-28 — End: 2013-09-28

## 2013-09-28 MED ORDER — ONDANSETRON HCL 4 MG PO TABS: 4.0000 mg | ORAL_TABLET | Freq: Four times a day (QID) | ORAL | Status: DC | PRN

## 2013-09-28 MED ORDER — SODIUM CHLORIDE 0.9 % IV SOLN
Freq: Once | INTRAVENOUS | Status: AC
  Administered 2013-09-28: 13:00:00 via INTRAVENOUS

## 2013-09-28 MED ORDER — SODIUM CHLORIDE 0.9 % IV SOLN
1000.0000 mL | INTRAVENOUS | Status: DC
  Administered 2013-09-28: 1000 mL via INTRAVENOUS

## 2013-09-28 MED ORDER — ONDANSETRON HCL 4 MG/2ML IJ SOLN
4.0000 mg | Freq: Four times a day (QID) | INTRAMUSCULAR | Status: DC | PRN
  Administered 2013-09-28 – 2013-09-29 (×3): 4 mg via INTRAVENOUS
  Filled 2013-09-28 (×3): qty 2

## 2013-09-28 MED ORDER — INSULIN ASPART 100 UNIT/ML ~~LOC~~ SOLN
0.0000 [IU] | SUBCUTANEOUS | Status: DC
  Administered 2013-09-28: 2 [IU] via SUBCUTANEOUS
  Administered 2013-09-28: 3 [IU] via SUBCUTANEOUS
  Administered 2013-09-29: 2 [IU] via SUBCUTANEOUS
  Administered 2013-09-29: 17:00:00 via SUBCUTANEOUS
  Administered 2013-09-29: 5 [IU] via SUBCUTANEOUS
  Administered 2013-09-29: 3 [IU] via SUBCUTANEOUS
  Administered 2013-09-29: 5 [IU] via SUBCUTANEOUS

## 2013-09-28 MED ORDER — HYDRALAZINE HCL 20 MG/ML IJ SOLN
5.0000 mg | INTRAMUSCULAR | Status: DC | PRN
  Administered 2013-09-28: 5 mg via INTRAVENOUS
  Filled 2013-09-28: qty 1

## 2013-09-28 MED ORDER — SODIUM CHLORIDE 0.9 % IV SOLN
INTRAVENOUS | Status: DC
  Administered 2013-09-28: 2.1 [IU]/h via INTRAVENOUS
  Filled 2013-09-28: qty 2.5

## 2013-09-28 MED ORDER — SODIUM CHLORIDE 0.9 % IV SOLN
INTRAVENOUS | Status: DC
  Administered 2013-09-28 – 2013-09-30 (×6): via INTRAVENOUS

## 2013-09-28 MED ORDER — DEXTROSE-NACL 5-0.45 % IV SOLN: INTRAVENOUS | Status: DC

## 2013-09-28 NOTE — H&P (Signed)
[facility] History and Physical  DAKHARI CHICHESTER [id number] DOB: [birth year] DOA: 09/28/2013  Referring physician: ED PHYSICIAN, DR. Alvino Chapel PCP: [name], MD   Chief Complaint: Nausea, vomiting, and abdominal pain.  HPI: Brent Daniels is a 28 y.o. male with a history of type 1 diabetes mellitus, chronic back pain, and peptic ulcer disease. His history is also significant for a recent EGD by Dr. Buford Dresser on 09/11/13 that revealed a suspicion of gastroparesis in the setting of very poorly controlled blood sugars. He presents to the emergency department today with a complaint of intractable nausea, vomiting, and acute on chronic abdominal pain. His symptoms started yesterday morning. They have progressed. His pain is described as sharp and achy. It has been constant. It as a 10 over 10 in intensity. It is generalized, but tends to radiate to the left upper and left lower quadrant. The pain has been associated with multiple episodes of nausea and vomiting. He has had more than 20 episodes of emesis over the past 24 hours. He has had several dark red colored emesis which he believes was blood. His last bowel movement was this morning and was considered normal. He denies black tarry stools. He denies associated fever, chills, chest pain, shortness of breath, or pain with urination. He denies missing any doses of insulin. He has not been able to eat or drink much over the past 24 hours.  In the emergency department, he was afebrile and hypertensive with a blood pressure ranging from 162/103-186-108. His lab data were significant for a venous glucose of 444, WBC of 15.6 and an elevated anion gap, but is CO2 was 29. His urinalysis revealed greater than 1000 glucose and 40 ketones. He was admitted for further evaluation and management.   Review of Systems:  As above in history present illness. In addition, he has chronic neuropathic numbness and tingling in his extremities; he has chronic low  back pain; he has chronic abdominal pain; otherwise review of systems is negative.   Past Medical History  Diagnosis Date  . Diabetes mellitus     type 1  . Arthritis   . Back pain   . GERD (gastroesophageal reflux disease)   . PUD (peptic ulcer disease)   . Hypertension   . Heart murmur     valvular stenosis  . Neuropathy   . Diabetic gastroparesis associated with type 1 diabetes mellitus   . S/P arthroscopic knee surgery 07/04/2011  . Scoliosis 07/11/2012   Past Surgical History  Procedure Laterality Date  . Tympanostomy tube placement    . Knee arthroscopy  06/30/2011    Procedure: ARTHROSCOPY KNEE;  Surgeon: Carole Civil, MD;  Location: [facility];  Service: Orthopedics;  Laterality: Right;  diagnostic arthroscopy  . Esophagogastroduodenoscopy (egd) with propofol  06/17/2013    Dr. Oneida Alar: two gastric ulcers in fundus, mild antral gastritis, candida esophagitis  . Esophageal biopsy  06/17/2013    Procedure: GASTRIC ULCER AND ANTRAL BIOPSIES; ESOPHAGEAL BRUSHING;  Surgeon: Danie Binder, MD;  Location: [facility];  Service: Endoscopy;;  . Esophagogastroduodenoscopy (egd) with propofol N/A 09/11/2013    Procedure: ESOPHAGOGASTRODUODENOSCOPY (EGD) WITH PROPOFOL DIAGNOSTIC INCOMPLETE (STOMACH FULL PRECLUDED FULL EXAM);  Surgeon: Daneil Dolin, MD;  Location: [facility];  Service: Endoscopy;  Laterality: N/A;   Social History: He is single. He has no children. He is unemployed. He lives with his mother. He smokes 5-6 cigarettes daily. He denies illicit drug use and alcohol use.  Allergies  Allergen Reactions  .  Hydrocodone Hives  . Ibuprofen Other (See Comments)    Stomach ulcers  . Naproxen Other (See Comments)    Stomach ulcers  . Sulfa Antibiotics Other (See Comments)    Stomach ulcers  . Morphine And Related Rash    Family History  Problem Relation Age of Onset  . Cancer Father   . Alcohol abuse Father   . Pseudochol deficiency Neg Hx   . Malignant hyperthermia Neg Hx   .  Hypotension Neg Hx   . Anesthesia problems Neg Hx   . Colon cancer Neg Hx      Prior to Admission medications   Medication Sig Start Date End Date Taking? Authorizing Provider  gabapentin (NEURONTIN) 600 MG tablet Take 1 tablet (600 mg total) by mouth 3 (three) times daily. 06/18/13  Yes Barton Dubois, MD  insulin NPH-regular Human (NOVOLIN 70/30) (70-30) 100 UNIT/ML injection Inject 15-25 Units into the skin 2 (two) times daily with a meal. 15 units in the morning and 25 at night.   Yes Historical Provider, MD  lisinopril (PRINIVIL,ZESTRIL) 20 MG tablet Take 20 mg by mouth daily.   Yes Historical Provider, MD  pantoprazole (PROTONIX) 40 MG tablet Take 1 tablet (40 mg total) by mouth 2 (two) times daily before a meal. 06/18/13  Yes Barton Dubois, MD  pravastatin (PRAVACHOL) 20 MG tablet Take 20 mg by mouth daily.  09/10/13 09/10/14 Yes Historical Provider, MD  promethazine (PHENERGAN) 25 MG tablet Take 1 tablet (25 mg total) by mouth every 6 (six) hours as needed for nausea or vomiting. [id number]  Yes Delora Fuel, MD  tiZANidine (ZANAFLEX) 4 MG tablet Take 1 tablet by mouth every 8 (eight) hours as needed for muscle spasms.  07/30/13  Yes Historical Provider, MD   Physical Exam: Filed Vitals:   09/28/13 1330 09/28/13 1400 09/28/13 1430 09/28/13 1500  BP: 186/108 169/92 162/103 176/98  Pulse: 103 83 83 93  Temp:      TempSrc:      Resp:      Height:      Weight:      SpO2: 100% 98% 98% 98%    Wt Readings from Last 3 Encounters:  09/28/13 81.647 kg (180 lb)  09/11/13 81.194 kg (179 lb)  09/05/13 80.287 kg (177 lb)    General:  Alert 28 year old man sitting up in bed, who appears in pain. Eyes: PERRL, normal lids, irises & conjunctiva ENT: grossly normal hearing, lips & tongue; mucous membranes are mildly dry. No posterior exudates or erythema. Neck: no LAD, masses or thyromegaly Cardiovascular: RRR, no m/r/g. No LE edema. Telemetry: SR, no arrhythmias  Respiratory: CTA bilaterally, no  w/r/r. Normal respiratory effort. Abdomen: Hypoactive bowel sounds, soft, moderately diffusely tender without distention; mild voluntary guarding; no masses palpated.  Skin: no rash or induration seen on limited exam Musculoskeletal: grossly normal tone BUE/BLE Psychiatric: Flat affect; speech fluent and appropriate Neurologic: grossly non-focal; cranial nerves II through XII are intact.           Labs on Admission:  Basic Metabolic Panel:  Recent Labs Lab 09/28/13 1215 09/28/13 1506  NA 137 139  K 4.0 4.7  CL 87* 96  CO2 29 31  GLUCOSE 444* 273*  BUN 15 13  CREATININE 0.90 0.84  CALCIUM 10.8* 9.6   Liver Function Tests:  Recent Labs Lab 09/28/13 1215  AST 20  ALT 20  ALKPHOS 111  BILITOT 1.8*  PROT 9.0*  ALBUMIN 5.2    Recent Labs Lab 09/28/13  1215  LIPASE 10*   No results found for this basename: AMMONIA,  in the last 168 hours CBC:  Recent Labs Lab 09/28/13 1215  WBC 15.6*  NEUTROABS 12.0*  HGB 16.7  HCT 48.5  MCV 90.1  PLT 357   Cardiac Enzymes: No results found for this basename: CKTOTAL, CKMB, CKMBINDEX, TROPONINI,  in the last 168 hours  BNP (last 3 results) No results found for this basename: PROBNP,  in the last 8760 hours CBG:  Recent Labs Lab 09/28/13 1205 09/28/13 1447  GLUCAP 419* 271*    Radiological Exams on Admission: No results found.  EKG: Independently reviewed. Not applicable.  Assessment/Plan Principal Problem:   DKA, type 1 Active Problems:   Intractable nausea and vomiting   Diabetic gastroparesis associated with type 1 diabetes mellitus   HTN (hypertension)   Chronic generalized abdominal pain   Hematemesis   Diabetic neuropathy   1. Type 1 diabetes mellitus with DKA. His anion gap was 20 on admission and has close to 12 following insulin drip and IV fluids started in the ED. He has a history of uncontrolled diabetes, but reports that his blood sugars have been relatively controlled at home up until today.  However, his hemoglobin A1c ordered a couple weeks ago was 11.4, indicating poor outpatient control. For ongoing treatment, will transition the insulin therapy to sliding scale NovoLog every 4 hours. We'll give him 15 units of Lantus prior to the discontinuation of the insulin drip. We'll continue vigorous IV fluids. We'll continue to monitor his blood glucose closely. 2. Intractable nausea and vomiting and abdominal pain. His lipase and liver transaminases are unremarkable. Per the recent evaluation by gastroenterology, there was a suspicion of gastroparesis as his stomach still had food in it during the exam. He also has a history of peptic ulcer disease. He was given IV fentanyl, Reglan, Phenergan, and Zofran in the ED without too much relief. Will try to manage his symptoms with as needed IV hydromorphone, Protonix IV every 12 hours, and every 6 hour dosing of Reglan. Will also give when necessary Zofran. We'll make him n.p.o. for now. [facility] consult gastroenterology for for further recommendations. 3. Possible hematemesis. The patient reports several emesis with suspected dark red blood this morning. He denies any recent hematemesis the last few times that he has vomited. We will continue PPI therapy IV every 12 hours. He has no history of liver disease. [facility] consult gastroenterology. 4. Hypertension. He is treated chronically with enalapril. His elevated blood pressure may be the consequence of pain, in part. While he is n.p.o., we'll treat with clonidine patch and when necessary IV hydralazine.    Code Status: Full code DVT Prophylaxis: SCDs Family Communication: Discussed with patient; family not available. Disposition Plan: Discharge to home when clinically improved.  Time spent: One hour.  [facility] Pager [phone]  **Disclaimer: This note may have been dictated with voice recognition software. Similar sounding words can inadvertently be transcribed and this note may  contain transcription errors which may not have been corrected upon publication of note.**

## 2013-09-28 NOTE — ED Provider Notes (Signed)
CSN: [id number]     Arrival date & time 09/28/13  1152 History  This chart was scribed for [employer]. Alvino Chapel, MD by Starleen Arms, ED Scribe. This patient was seen in room [facility] and the patient's care was started at 12:22 PM.    Chief Complaint  Patient presents with  . Emesis   The history is provided by the patient. No language interpreter was used.    HPI Comments: Brent Daniels is a 28 y.o. male with a history of PUD who presents to the Emergency Department complaining of vomiting consisting of normal stomach contents onset yesterday with associated nausea.  He states it feels like a typical exacerbation of his stomach ulcer.  Patient states he has never been examined for gastroparesis.  Patient states he takes insulin for DM and is compliant.  He states he can tell when his blood sugar is abnormal, but currently denies feeling that way.  He states he ran out of DM testing supplies yesterday.  Patient denies fever, diarrhea, sick contacts.   Past Medical History  Diagnosis Date  . Diabetes mellitus     type 1  . Arthritis   . Back pain   . GERD (gastroesophageal reflux disease)   . PUD (peptic ulcer disease)   . Hypertension   . Heart murmur     valvular stenosis  . Neuropathy    Past Surgical History  Procedure Laterality Date  . Tympanostomy tube placement    . Knee arthroscopy  06/30/2011    Procedure: ARTHROSCOPY KNEE;  Surgeon: Carole Civil, MD;  Location: [facility];  Service: Orthopedics;  Laterality: Right;  diagnostic arthroscopy  . Esophagogastroduodenoscopy (egd) with propofol  06/17/2013    Dr. Oneida Alar: two gastric ulcers in fundus, mild antral gastritis, candida esophagitis  . Esophageal biopsy  06/17/2013    Procedure: GASTRIC ULCER AND ANTRAL BIOPSIES; ESOPHAGEAL BRUSHING;  Surgeon: Danie Binder, MD;  Location: [facility];  Service: Endoscopy;;  . Esophagogastroduodenoscopy (egd) with propofol N/A 09/11/2013    Procedure: ESOPHAGOGASTRODUODENOSCOPY (EGD)  WITH PROPOFOL DIAGNOSTIC INCOMPLETE (STOMACH FULL PRECLUDED FULL EXAM);  Surgeon: Daneil Dolin, MD;  Location: [facility];  Service: Endoscopy;  Laterality: N/A;   Family History  Problem Relation Age of Onset  . Cancer Father   . Alcohol abuse Father   . Pseudochol deficiency Neg Hx   . Malignant hyperthermia Neg Hx   . Hypotension Neg Hx   . Anesthesia problems Neg Hx   . Colon cancer Neg Hx    History  Substance Use Topics  . Smoking status: Current Every Day Smoker -- 0.50 packs/day for 10 years    Types: Cigarettes  . Smokeless tobacco: Former Systems developer    Quit date: 02/17/2013     Comment: smokes 4 cigarettes a day   . Alcohol Use: No    Review of Systems  Constitutional: Negative for fever.  Gastrointestinal: Positive for nausea and vomiting. Negative for diarrhea.      Allergies  Hydrocodone; Ibuprofen; Naproxen; Sulfa antibiotics; and Morphine and related  Home Medications   Prior to Admission medications   Medication Sig Start Date End Date Taking? Authorizing Provider  gabapentin (NEURONTIN) 600 MG tablet Take 1 tablet (600 mg total) by mouth 3 (three) times daily. 06/18/13  Yes Barton Dubois, MD  insulin NPH-regular Human (NOVOLIN 70/30) (70-30) 100 UNIT/ML injection Inject 15-25 Units into the skin 2 (two) times daily with a meal. 15 units in the morning and 25 at night.  Yes Historical Provider, MD  lisinopril (PRINIVIL,ZESTRIL) 20 MG tablet Take 20 mg by mouth daily.   Yes Historical Provider, MD  pantoprazole (PROTONIX) 40 MG tablet Take 1 tablet (40 mg total) by mouth 2 (two) times daily before a meal. 06/18/13  Yes Barton Dubois, MD  pravastatin (PRAVACHOL) 20 MG tablet Take 20 mg by mouth daily.  09/10/13 09/10/14 Yes Historical Provider, MD  promethazine (PHENERGAN) 25 MG tablet Take 1 tablet (25 mg total) by mouth every 6 (six) hours as needed for nausea or vomiting. [id number]  Yes Delora Fuel, MD  tiZANidine (ZANAFLEX) 4 MG tablet Take 1 tablet by mouth every 8  (eight) hours as needed for muscle spasms.  07/30/13  Yes Historical Provider, MD   BP 169/104  Pulse 75  Temp(Src) 98 F (36.7 C) (Oral)  Resp 18  Ht 6\' 1"  (1.854 m)  Wt 180 lb (81.647 kg)  BMI 23.75 kg/m2  SpO2 100% Physical Exam  Nursing note and vitals reviewed. Constitutional:  Uncomfortable.   Cardiovascular:  Tachycardic  Abdominal: There is tenderness.  Upper abdominal tenderness but will not allow abdominal examination.     ED Course  Procedures (including critical care time)   COORDINATION OF CARE:  12:29 PM Discussed treatment plan with patient at bedside.  Patient acknowledges and agrees with plan.     Labs Review Labs Reviewed  CBC WITH DIFFERENTIAL - Abnormal; Notable for the following:    WBC 15.6 (*)    Neutro Abs 12.0 (*)    All other components within normal limits  URINALYSIS, ROUTINE W REFLEX MICROSCOPIC - Abnormal; Notable for the following:    Specific Gravity, Urine <1.005 (*)    Glucose, UA >1000 (*)    Ketones, ur 40 (*)    All other components within normal limits  LIPASE, BLOOD - Abnormal; Notable for the following:    Lipase 10 (*)    All other components within normal limits  COMPREHENSIVE METABOLIC PANEL - Abnormal; Notable for the following:    Chloride 87 (*)    Glucose, Bld 444 (*)    Calcium 10.8 (*)    Total Protein 9.0 (*)    Total Bilirubin 1.8 (*)    Anion gap 21 (*)    All other components within normal limits  CBG MONITORING, ED - Abnormal; Notable for the following:    Glucose-Capillary 419 (*)    All other components within normal limits  CBG MONITORING, ED - Abnormal; Notable for the following:    Glucose-Capillary 271 (*)    All other components within normal limits  URINE MICROSCOPIC-ADD ON  BASIC METABOLIC PANEL    Imaging Review No results found.   EKG Interpretation None      MDM   Final diagnoses:  Abdominal pain, epigastric  Diabetic ketoacidosis without coma associated with type 1 diabetes  mellitus    Patient with DKA and acute on chronic abdominal pain. Sugar of 400. Ketones in urine and has anion gap. Continued pain and nausea. Will admit to internal medicine.  I personally performed the services described in this documentation, which was scribed in my presence. The recorded information has been reviewed and is accurate.     Jasper Riling. Alvino Chapel, MD 09/28/13 [phone]

## 2013-09-28 NOTE — ED Notes (Signed)
Pt c/o n/v that started two days ago, pt also concerned that his blood sugar is running high, states that he has not checked it in three days due to not having test strips,

## 2013-09-29 ENCOUNTER — Inpatient Hospital Stay (HOSPITAL_COMMUNITY): Payer: Managed Care, Other (non HMO)

## 2013-09-29 ENCOUNTER — Encounter (HOSPITAL_COMMUNITY): Payer: Self-pay | Admitting: Gastroenterology

## 2013-09-29 DIAGNOSIS — R000 Tachycardia, unspecified: Secondary | ICD-10-CM | POA: Diagnosis present

## 2013-09-29 DIAGNOSIS — IMO0001 Reserved for inherently not codable concepts without codable children: Secondary | ICD-10-CM

## 2013-09-29 DIAGNOSIS — I10 Essential (primary) hypertension: Secondary | ICD-10-CM

## 2013-09-29 DIAGNOSIS — D72829 Elevated white blood cell count, unspecified: Secondary | ICD-10-CM | POA: Diagnosis present

## 2013-09-29 DIAGNOSIS — E1165 Type 2 diabetes mellitus with hyperglycemia: Secondary | ICD-10-CM

## 2013-09-29 DIAGNOSIS — D72819 Decreased white blood cell count, unspecified: Secondary | ICD-10-CM | POA: Diagnosis present

## 2013-09-29 LAB — COMPREHENSIVE METABOLIC PANEL
ALT: 13 U/L (ref 0–53)
AST: 15 U/L (ref 0–37)
Albumin: 4 g/dL (ref 3.5–5.2)
Alkaline Phosphatase: 82 U/L (ref 39–117)
BUN: 13 mg/dL (ref 6–23)
CALCIUM: 9.4 mg/dL (ref 8.4–10.5)
CO2: 21 mEq/L (ref 19–32)
Chloride: 96 mEq/L (ref 96–112)
Creatinine, Ser: 0.76 mg/dL (ref 0.50–1.35)
GFR calc non Af Amer: >90 mL/min (ref >90)
GLUCOSE: 302 mg/dL — AB (ref 70–99)
POTASSIUM: 4.1 meq/L (ref 3.7–5.3)
SODIUM: 140 meq/L (ref 137–147)
TOTAL PROTEIN: 7.2 g/dL (ref 6.0–8.3)
Total Bilirubin: 1.6 mg/dL — ABNORMAL HIGH (ref 0.3–1.2)

## 2013-09-29 LAB — URINALYSIS, ROUTINE W REFLEX MICROSCOPIC
Bilirubin Urine: NEGATIVE
GLUCOSE, UA: 500 mg/dL — AB
HGB URINE DIPSTICK: NEGATIVE
Leukocytes, UA: NEGATIVE
Nitrite: NEGATIVE
PROTEIN: NEGATIVE mg/dL
Specific Gravity, Urine: 1.025 (ref 1.005–1.030)
UROBILINOGEN UA: 0.2 mg/dL (ref 0.0–1.0)
pH: 5.5 (ref 5.0–8.0)

## 2013-09-29 LAB — GLUCOSE, CAPILLARY
GLUCOSE-CAPILLARY: 287 mg/dL — AB (ref 70–99)
Glucose-Capillary: 239 mg/dL — ABNORMAL HIGH (ref 70–99)
Glucose-Capillary: 266 mg/dL — ABNORMAL HIGH (ref 70–99)
Glucose-Capillary: 248 mg/dL — ABNORMAL HIGH (ref 70–99)
Glucose-Capillary: 199 mg/dL — ABNORMAL HIGH (ref 70–99)

## 2013-09-29 LAB — CBC
HCT: 41.2 % (ref 39.0–52.0)
HEMOGLOBIN: 14 g/dL (ref 13.0–17.0)
MCH: 30.8 pg (ref 26.0–34.0)
MCHC: 34 g/dL (ref 30.0–36.0)
MCV: 90.5 fL (ref 78.0–100.0)
Platelets: 320 10*3/uL (ref 150–400)
RBC: 4.55 MIL/uL (ref 4.22–5.81)
RDW: 14.2 % (ref 11.5–15.5)
WBC: 19.7 10*3/uL — ABNORMAL HIGH (ref 4.0–10.5)

## 2013-09-29 MED ORDER — ONDANSETRON HCL 4 MG/2ML IJ SOLN
4.0000 mg | Freq: Three times a day (TID) | INTRAMUSCULAR | Status: DC
  Administered 2013-09-29 – 2013-09-30 (×5): 4 mg via INTRAVENOUS
  Filled 2013-09-29 (×5): qty 2

## 2013-09-29 MED ORDER — INSULIN GLARGINE 100 UNIT/ML ~~LOC~~ SOLN
20.0000 [IU] | Freq: Two times a day (BID) | SUBCUTANEOUS | Status: DC
  Administered 2013-09-29 – 2013-09-30 (×3): 20 [IU] via SUBCUTANEOUS
  Filled 2013-09-29 (×5): qty 0.2

## 2013-09-29 MED ORDER — GI COCKTAIL ~~LOC~~
30.0000 mL | Freq: Once | ORAL | Status: AC
  Administered 2013-09-29: 30 mL via ORAL
  Filled 2013-09-29: qty 30

## 2013-09-29 MED ORDER — HYDROMORPHONE HCL PF 1 MG/ML IJ SOLN
1.0000 mg | INTRAMUSCULAR | Status: DC | PRN
  Administered 2013-09-29 – 2013-10-01 (×17): 1 mg via INTRAVENOUS
  Filled 2013-09-29 (×17): qty 1

## 2013-09-29 MED ORDER — GI COCKTAIL ~~LOC~~
30.0000 mL | Freq: Once | ORAL | Status: AC
  Administered 2013-09-29: 30 mL via ORAL
  Filled 2013-09-29 (×2): qty 30

## 2013-09-29 NOTE — Progress Notes (Signed)
UR chart review completed.  

## 2013-09-29 NOTE — Progress Notes (Signed)
Pt has had multiple recurrent bouts of emesis  All anti-emetic/nausea meds given  Physician(David) notified - orders for GI cocktail et Al received read back and given. Pt states some relief at this notation time.

## 2013-09-29 NOTE — Progress Notes (Signed)
Patient was hospitalized May 10-13 2015 with DKA and diabetes coordinator talked with patient extensively about glycemic control and various resources. Spoke with patient about diabetes and home regimen for diabetes control. Patient states that he was diagnosed with diabetes at the age of 28 years old and is followed by Dr. Talbert Forest Corrington in [place] for diabetes control. Currently patient is taking Novolin 70/30 15 units in the morning, Novolin 70/30 25 units QHS, and Novolin R sliding scale for diabetes control. Inquired if patient was able to get insulins as prescribed. Patient states that he has insurance but they do not cover any of the cost for Novolin 70/30 or Novolin R ("since they are considered over the counter medications) so he  gets Novolin 70/30 and Novolin R from [employer] for $24.88 per vial. He admits that he sometimes runs out of insulin and is not able to afford them. When patient was seen in May he was encouraged to look into medication assistance through insulin manufactures.  Patient reports that he has applied before them before but he is always turned down. Patient also states that he has applied for disability 3 times and has been turned down each time.  Encouraged patient to check into insulin manufacture medication assistance programs again. Discussed A1C (11.4% on 09/11/13) and importance of getting A1C down and toward goal of <7.0%. When asked if patient checks his blood sugar at home he states that he checks his blood sugar several times a day and reports that he has the Reli-On glucometer and gets his test strips from [employer] which is the most affordable brand of meter and testing supplies ($17 for Reli-On glucometer and $9 for 50 Reli-On test strips). Stressed importance of checking CBGs and maintaining good CBG control to prevent long-term and short-term complications. Patient reports that he knows what he needs to do but is limited by financial barriers in being able to afford  medications and supplies for diabetes management. Discussed community resources (health department, free clinic, social services to apply for Medicaid) with the patient and he reports that he has been denied by Medicaid in the past. Encouraged patient to reapply for assistance through social services and health department. Patient verbalized understanding of information discussed and states that he does not have any other questions related to diabetes at this time.  Thanks, Barnie Alderman, RN, MSN, CCRN Diabetes Coordinator Inpatient Diabetes Program [phone] (Team Pager) [phone] ([facility] office) [phone] [facility] office)

## 2013-09-29 NOTE — Consult Note (Addendum)
Referring Provider: Dr. Caryn Section Primary Care Physician:  [name], MD Primary Gastroenterologist:  Dr. [name]   Date of Admission: 09/28/13 Date of Consultation: 09/29/13  Reason for Consultation:  Intractable N/V, hematemesis  HPI:  Brent Daniels is a 28 year old male with a history of PUD, candida esophagitis, DKA, and likely diabetic gastroparesis, presenting with intractable nausea, vomiting, and acute on chronic abdominal pain. Originally, EGD in May 2015 revealed 2 gastric ulcers in fundus, mild antral gastritis, and candida esophagitis; surveillance EGD in Aug 2015 incomplete due to stomach full of food. He still will need a repeat EGD for verification of ulcer healing at some point in the near future.   Admitted again with DKA. States acute on chronic N/V 2 days ok. Prior to this was having vomiting spells 2-3 times a week but not as severe. Notes constant upper abdominal/LUQ pain. Appetite fair prior to acute illness. Denies NSAIDs, aspirin powders. States he has been compliant with Reglan dosing and Protonix BID. However, Reglan is not listed in outpatient medications. Unable to afford glucose test strips, stating they are not covered by insurance. No fever, chills, ETOH use, illicit drug use reported. No lower GI symptoms. Small amount of blood-tinged hematemesis yesterday. Wants ice chips/water to drink.   Past Medical History  Diagnosis Date  . Diabetes mellitus     type 1  . Arthritis   . Back pain   . GERD (gastroesophageal reflux disease)   . PUD (peptic ulcer disease)   . Hypertension   . Heart murmur     valvular stenosis  . Neuropathy   . Diabetic gastroparesis associated with type 1 diabetes mellitus   . S/P arthroscopic knee surgery 07/04/2011  . Scoliosis 07/11/2012    Past Surgical History  Procedure Laterality Date  . Tympanostomy tube placement    . Knee arthroscopy  06/30/2011    Procedure: ARTHROSCOPY KNEE;  Surgeon: Carole Civil, MD;   Location: [facility];  Service: Orthopedics;  Laterality: Right;  diagnostic arthroscopy  . Esophagogastroduodenoscopy (egd) with propofol  06/17/2013    Dr. Oneida Alar: two gastric ulcers in fundus, mild antral gastritis, candida esophagitis  . Esophageal biopsy  06/17/2013    Procedure: GASTRIC ULCER AND ANTRAL BIOPSIES; ESOPHAGEAL BRUSHING;  Surgeon: Danie Binder, MD;  Location: [facility];  Service: Endoscopy;;  . Esophagogastroduodenoscopy (egd) with propofol N/A 09/11/2013    Dr. [name]: abnormal hypopharynx, question massively enlarged tonsils, stomach full of food precluded exam, needs EGD for verification of ulcer healing at a later date    Prior to Admission medications   Medication Sig Start Date End Date Taking? Authorizing Provider  gabapentin (NEURONTIN) 600 MG tablet Take 1 tablet (600 mg total) by mouth 3 (three) times daily. 06/18/13  Yes Barton Dubois, MD  insulin NPH-regular Human (NOVOLIN 70/30) (70-30) 100 UNIT/ML injection Inject 15-25 Units into the skin 2 (two) times daily with a meal. 15 units in the morning and 25 at night.   Yes Historical Provider, MD  lisinopril (PRINIVIL,ZESTRIL) 20 MG tablet Take 20 mg by mouth daily.   Yes Historical Provider, MD  pantoprazole (PROTONIX) 40 MG tablet Take 1 tablet (40 mg total) by mouth 2 (two) times daily before a meal. 06/18/13  Yes Barton Dubois, MD  pravastatin (PRAVACHOL) 20 MG tablet Take 20 mg by mouth daily.  09/10/13 09/10/14 Yes Historical Provider, MD  promethazine (PHENERGAN) 25 MG tablet Take 1 tablet (25 mg total) by mouth every 6 (six) hours as needed  for nausea or vomiting. [id number]  Yes Delora Fuel, MD  tiZANidine (ZANAFLEX) 4 MG tablet Take 1 tablet by mouth every 8 (eight) hours as needed for muscle spasms.  07/30/13  Yes Historical Provider, MD    Current Facility-Administered Medications  Medication Dose Route Frequency Provider Last Rate Last Dose  . 0.9 %  sodium chloride infusion   Intravenous Continuous Rexene Alberts, MD 150  mL/hr at 09/29/13 0802    . acetaminophen (TYLENOL) tablet 650 mg  650 mg Oral Q6H PRN Rexene Alberts, MD       Or  . acetaminophen (TYLENOL) suppository 650 mg  650 mg Rectal Q6H PRN Rexene Alberts, MD      . albuterol (PROVENTIL) (2.5 MG/3ML) 0.083% nebulizer solution 2.5 mg  2.5 mg Nebulization Q2H PRN Rexene Alberts, MD      . cloNIDine (CATAPRES - Dosed in mg/24 hr) patch 0.1 mg  0.1 mg Transdermal Weekly Rexene Alberts, MD   0.1 mg at 09/28/13 1655  . hydrALAZINE (APRESOLINE) injection 5 mg  5 mg Intravenous Q4H PRN Rexene Alberts, MD   5 mg at 09/28/13 1817  . HYDROmorphone (DILAUDID) injection 1 mg  1 mg Intravenous Q4H PRN Rexene Alberts, MD   1 mg at 09/29/13 0759  . insulin aspart (novoLOG) injection 0-9 Units  0-9 Units Subcutaneous 6 times per day Rexene Alberts, MD   3 Units at 09/29/13 [phone]  . insulin glargine (LANTUS) injection 20 Units  20 Units Subcutaneous BID Rexene Alberts, MD      . metoCLOPramide (REGLAN) injection 5 mg  5 mg Intravenous 4 times per day Rexene Alberts, MD   5 mg at 09/29/13 [phone]  . ondansetron (ZOFRAN) tablet 4 mg  4 mg Oral Q6H PRN Rexene Alberts, MD       Or  . ondansetron [facility]) injection 4 mg  4 mg Intravenous Q6H PRN Rexene Alberts, MD   4 mg at 09/29/13 0701  . pantoprazole (PROTONIX) injection 40 mg  40 mg Intravenous Q12H Rexene Alberts, MD   40 mg at 09/29/13 0900  . promethazine (PHENERGAN) injection 12.5 mg  12.5 mg Intravenous Q6H PRN Rhetta Mura Schorr, NP   12.5 mg at 09/29/13 0739    Allergies as of 09/28/2013 - Review Complete 09/28/2013  Allergen Reaction Noted  . Hydrocodone Hives 08/23/2013  . Ibuprofen Other (See Comments) 08/14/2011  . Naproxen Other (See Comments) 08/14/2011  . Sulfa antibiotics Other (See Comments) 08/14/2011  . Morphine and related Rash 01/04/2012    Family History  Problem Relation Age of Onset  . Cancer Father   . Alcohol abuse Father   . Pseudochol deficiency Neg Hx   . Malignant hyperthermia Neg Hx   .  Hypotension Neg Hx   . Anesthesia problems Neg Hx   . Colon cancer Neg Hx     History   Social History  . Marital Status: Single    Spouse Name: N/A    Number of Children: N/A  . Years of Education: 12   Occupational History  . unemployed     trying to get disability   Social History Main Topics  . Smoking status: Current Every Day Smoker -- 0.50 packs/day for 10 years    Types: Cigarettes  . Smokeless tobacco: Former Systems developer    Quit date: 02/17/2013     Comment: smokes 4 cigarettes a day   . Alcohol Use: No  . Drug Use: No  . Sexual Activity: Not on file   Other  Topics Concern  . Not on file   Social History Narrative  . No narrative on file    Review of Systems: As mentioned in HPI.   Physical Exam: Vital signs in last 24 hours: Temp:  [97.9 F (36.6 C)-99.7 F (37.6 C)] 98.4 F (36.9 C) (08/24 0700) Pulse Rate:  [75-120] 111 (08/24 0630) Resp:  [9-25] 19 (08/24 0700) BP: (120-188)/(68-113) 168/89 mmHg (08/24 0700) SpO2:  [92 %-100 %] 95 % (08/24 0630) Weight:  [171 lb 8.3 oz (77.8 kg)-180 lb (81.647 kg)] 171 lb 8.3 oz (77.8 kg) (08/24 0500) Last BM Date: 09/28/13 General:   Alert,  Well-developed, well-nourished, pleasant and cooperative= Head:  Normocephalic and atraumatic. Eyes:  Sclera clear, no icterus.   Conjunctiva pink. Ears:  Normal auditory acuity. Nose:  No deformity, discharge,  or lesions. Lungs:  Clear throughout to auscultation.   No wheezes, crackles, or rhonchi. No acute distress. Heart:  S1 S2 present, no murmurs appreciated, tachycardic in the 1teens Abdomen: Soft. +BS. Winces just with stethoscope placed on abdomen. TTP LUQ/epigastrum.  Rectal:  Deferred  Msk:  Symmetrical without gross deformities. Normal posture. Extremities:  Without clubbing or edema. Neurologic:  Alert and  oriented x4;  grossly normal neurologically. Skin:  Intact without significant lesions or rashes. Psych:  Alert and cooperative. Flat affect  Intake/Output  from previous day: 08/23 0701 - 08/24 0700 In: 480 [P.O.:480] Out: 1650 [Urine:1650] Intake/Output this shift:    Lab Results:  Recent Labs  09/28/13 1215 09/29/13 0505  WBC 15.6* 19.7*  HGB 16.7 14.0  HCT 48.5 41.2  PLT 357 320   BMET  Recent Labs  09/28/13 1506 09/28/13 2042 09/29/13 0505  NA 139 139 140  K 4.7 4.2 4.1  CL 96 96 96  CO2 31 25 21   GLUCOSE 273* 251* 302*  BUN 13 12 13   CREATININE 0.84 0.71 0.76  CALCIUM 9.6 9.4 9.4   LFT  Recent Labs  09/28/13 1215 09/29/13 0505  PROT 9.0* 7.2  ALBUMIN 5.2 4.0  AST 20 15  ALT 20 13  ALKPHOS 111 82  BILITOT 1.8* 1.6*   Lab Results  Component Value Date   LIPASE 10* 09/28/2013     Impression: 28 year old male well-known to our practice from prior admissions and outpatient visits, with history of PUD and likely diabetic gastroparesis. Now with acute on chronic abdominal pain, acute on chronic N/V in the setting of DKA. Scant blood-tinged emesis noted likely secondary to repeated vomiting. No melena or evidence of anemia. Surveillance EGD for PUD attempted in Aug, but stomach was full of food. Elective EGD for surveillance of ulcers can be performed in the outpatient setting unless evidence of anemia, melena, further hematemesis.   Compliance an issue due to finances; admits to not monitoring blood sugars at home due to lack of test strips. Poorly controlled diabetes as culprit for current presentation. Underlying history of PUD could also contribute. Chronic abdominal pain likely multifactorial in this setting.   Leukocytosis: likely reactive, no concerning signs on exam. At baseline, patient winces with just slight palpation, which is consistent with outpatient evaluations previously. LFTs and lipase normal.    Plan: PPI IV BID Scheduled Zofran IV Reglan IV scheduled; may need to increase to 10 mg qac and hs if no improvement with scheduled Zofran.  Phenergan prn Hold on EGD unless anemia, overt GI  bleeding, no improvement in symptoms Needs outpatient surveillance via EGD due to history of PUD, diagnosed in May 2015 Will  allow ice chips/water per patient request Tight glycemic control   Orvil Feil, ANP-BC [facility]     LOS: 1 day    09/29/2013, 8:49 AM Attending note: Patient seen and examined.  Agree with above assessment and recommendations.

## 2013-09-29 NOTE — Care Management Note (Signed)
    Page 1 of 1   09/29/2013     2:04:46 PM CARE MANAGEMENT NOTE 09/29/2013  Patient:  HOLT, LEARD   Account Number:  [id number]  Date Initiated:  09/29/2013  Documentation initiated by:  Theophilus Kinds  Subjective/Objective Assessment:   Pt admitted from home with DKA. Pt lives with his mother and will return home at discharge. Pt is independent with ADL's.     Action/Plan:   No CM needs noted.   Anticipated DC Date:  09/30/2013   Anticipated DC Plan:  [place]  CM consult      Choice offered to / List presented to:             Status of service:  Completed, signed off Medicare Important Message given?   (If response is "NO", the following Medicare IM given date fields will be blank) Date Medicare IM given:   Medicare IM given by:   Date Additional Medicare IM given:   Additional Medicare IM given by:    Discharge Disposition:  HOME/SELF CARE  Per UR Regulation:    If discussed at Long Length of Stay Meetings, dates discussed:    Comments:  09/29/13 [place], RN BSN CM

## 2013-09-29 NOTE — Progress Notes (Signed)
[facility] PROGRESS NOTE  INMAN KOSIN [id number] DOB: 09-28-1985 DOA: 09/28/2013 PCP: [name], MD    Code Status: Full code Family Communication: No family available; discussed with patient Disposition Plan: Discharge to home clinically appropriate   Consultants:  Gastroenterology  Procedures:  None  Antibiotics:  None  HPI/Subjective: The patient continues to complain of 10 over 10 abdominal pain, epigastric, radiating to the left. He has had several episodes of emesis, some blood-tinged this morning. He denies a. cough, diarrhea, or pain with urination. He denies subjective fever or chills.  Objective: Filed Vitals:   09/29/13 0900  BP: 144/89  Pulse:   Temp:   Resp: 15   pulse 111 temperature 98.4 oxygen saturation 95% on room air  Intake/Output Summary (Last 24 hours) at 09/29/13 0944 Last data filed at 09/29/13 0700  Gross per 24 hour  Intake    480 ml  Output   1650 ml  Net  -1170 ml   Filed Weights   09/28/13 1157 09/29/13 0500  Weight: 81.647 kg (180 lb) 77.8 kg (171 lb 8.3 oz)    Exam:   General:  Alert, but ill-appearing 28 year old Caucasian man sitting up in bed, in no acute distress.   Cardiovascular: S1, S2, with tachycardia.   Respiratory: clear to auscultation bilaterally.  Abdomen: rare bowel sounds, soft, moderately tender in the epigastrium and right mid quadrant.   Musculoskeletal: pedal pulses palpable. No pedal edema. No acute hot red joints.   Neuropsychiatric: He is slightly anxious, but his speech is clear. He is alert and oriented x3. Cranial nerves II through XII are intact.    Data Reviewed: Basic Metabolic Panel:  Recent Labs Lab 09/28/13 1215 09/28/13 1506 09/28/13 2042 09/29/13 0505  NA 137 139 139 140  K 4.0 4.7 4.2 4.1  CL 87* 96 96 96  CO2 29 31 25 21   GLUCOSE 444* 273* 251* 302*  BUN 15 13 12 13   CREATININE 0.90 0.84 0.71 0.76  CALCIUM 10.8* 9.6 9.4 9.4   Liver Function  Tests:  Recent Labs Lab 09/28/13 1215 09/29/13 0505  AST 20 15  ALT 20 13  ALKPHOS 111 82  BILITOT 1.8* 1.6*  PROT 9.0* 7.2  ALBUMIN 5.2 4.0    Recent Labs Lab 09/28/13 1215  LIPASE 10*   No results found for this basename: AMMONIA,  in the last 168 hours CBC:  Recent Labs Lab 09/28/13 1215 09/29/13 0505  WBC 15.6* 19.7*  NEUTROABS 12.0*  --   HGB 16.7 14.0  HCT 48.5 41.2  MCV 90.1 90.5  PLT 357 320   Cardiac Enzymes: No results found for this basename: CKTOTAL, CKMB, CKMBINDEX, TROPONINI,  in the last 168 hours BNP (last 3 results) No results found for this basename: PROBNP,  in the last 8760 hours CBG:  Recent Labs Lab 09/28/13 1818 09/28/13 1951 09/28/13 2345 09/29/13 0343 09/29/13 0724  GLUCAP 157* 196* 249* 287* 239*    Recent Results (from the past 240 hour(s))  MRSA PCR SCREENING     Status: None   Collection Time    09/28/13  3:43 PM      Result Value Ref Range Status   MRSA by PCR NEGATIVE  NEGATIVE Final   Comment:            The GeneXpert MRSA Assay (FDA     approved for NASAL specimens     only), is one component of a     comprehensive MRSA colonization  surveillance program. It is not     intended to diagnose MRSA     infection nor to guide or     monitor treatment for     MRSA infections.     Studies: No results found.  Scheduled Meds: . cloNIDine  0.1 mg Transdermal Weekly  . insulin aspart  0-9 Units Subcutaneous 6 times per day  . insulin glargine  20 Units Subcutaneous BID  . metoCLOPramide (REGLAN) injection  5 mg Intravenous 4 times per day  . ondansetron (ZOFRAN) IV  4 mg Intravenous TID AC, HS, 0200  . pantoprazole (PROTONIX) IV  40 mg Intravenous Q12H   Continuous Infusions: . sodium chloride 150 mL/hr at 09/29/13 0802    Principal Problem:   DKA, type 1 Active Problems:   Intractable nausea and vomiting   Diabetic gastroparesis associated with type 1 diabetes mellitus   HTN (hypertension)   Chronic  generalized abdominal pain   Hematemesis   Diabetic neuropathy   Leukocytosis, unspecified   Sinus tachycardia  Type 1 diabetes mellitus with DKA.  Now resolved, status post DKA insulin drip protocol and vigorous IV fluids. We will continue sliding scale NovoLog every 4 hours. We'll add twice a day Lantus to try to achieve better glycemic control. He reports good control of his diabetes, but his hemoglobin A1c a couple weeks ago was 11.4, indicating poor outpatient control. Intractable nausea, vomiting, low-grade hematemesis, and acute on chronic abdominal pain  Etiology likely multifactorial including DKA, peptic ulcer disease, and diabetic gastroparesis. His lipase and liver transaminases were unremarkable. Per the recent evaluation by gastroenterology, there was a suspicion of gastroparesis as his stomach still had food in it during the exam. He also has a history of peptic ulcer disease. We will continue scheduled IV Reglan, with a possibility of increasing the dose to 10 mg IV every 6 hours. We'll continue IV Protonix every 12 hours. Gastroenterology has evaluated the patient and have scheduled Zofran IV rather than when necessary. Continue as needed IV hydromorphone; will increase the frequency to every 3 hours when necessary as the patient's pain is not being relieved. Will defer further evaluation endoscopically or radiographically to GI. Will keep virtually n.p.o. with an occasional ice chips.   Hypertension. He is treated chronically with enalapril. His elevated blood pressure may be the consequence of pain, in part. While he is n.p.o., we'll treat with clonidine patch and when necessary IV hydralazine. Sinus tachycardia. This may be the result of pain and mild volume depletion. We will continue to treat him symptomatically. Continue IV fluid hydration. We'll order TSH for further evaluation. Leukocytosis. He has no infective type symptoms. He is afebrile. His chest x-ray reveals no acute  findings. Followup urinalysis pending, but initial urinalysis was negative for WBCs. We will continue to monitor and treat him supportively.   Time spent: 35 minutes.    [facility] Pager [phone]. If 7PM-7AM, please contact night-coverage at [email or web address], password [facility] 09/29/2013, 9:44 AM  LOS: 1 day

## 2013-09-29 NOTE — Progress Notes (Signed)
Pt a/o.vss. Iv patent. Resting comfortably at this time. Report given to [place], Therapist, sports. Pt to be transferred to room 324 via wheelchair by nursing staff.

## 2013-09-29 NOTE — Progress Notes (Signed)
INITIAL NUTRITION ASSESSMENT  DOCUMENTATION CODES Per approved criteria  -Not Applicable   INTERVENTION: Follow for diet advancement  NUTRITION DIAGNOSIS: Inadequate oral intake related to altered GI function as evidenced by NPO.   Goal: Pt will meet >90% of estimated nutritional needs  Monitor:  Diet advancement, PO intake, labs, weight changes, I/O's  Reason for Assessment: MST=2  28 y.o. male  Admitting Dx: DKA, type 1  Brent Daniels is a 28 y.o. male with a history of type 1 diabetes mellitus, chronic back pain, and peptic ulcer disease. His history is also significant for a recent EGD by Dr. Buford Dresser on 09/11/13 that revealed a suspicion of gastroparesis in the setting of very poorly controlled blood sugars. He presents to the emergency department today with a complaint of intractable nausea, vomiting, and acute on chronic abdominal pain. His symptoms started yesterday morning. They have progressed. His pain is described as sharp and achy. It has been constant. It as a 10 over 10 in intensity. It is generalized, but tends to radiate to the left upper and left lower quadrant. The pain has been associated with multiple episodes of nausea and vomiting. He has had more than 20 episodes of emesis over the past 24 hours. He has had several dark red colored emesis which he believes was blood. His last bowel movement was this morning and was considered normal. He denies black tarry stools. He denies associated fever, chills, chest pain, shortness of breath, or pain with urination. He denies missing any doses of insulin. He has not been able to eat or drink much over the past 24 hours.  In the emergency department, he was afebrile and hypertensive with a blood pressure ranging from 162/103-186-108. His lab data were significant for a venous glucose of 444, WBC of 15.6 and an elevated anion gap, but is CO2 was 29. His urinalysis revealed greater than 1000 glucose and 40 ketones. He was admitted for  further evaluation and management.  ASSESSMENT: Pt admitted with DKA with possible diabetic gastroparesis. Pt with hx of gastric ulcers, per EGD in 5/15. Repeat EGD was unable to be completed this month due to poor prep; will need EGD in the future to verify healing of ulcer. Pt has been noncompliant with self-management due to inability to afford testing strips. Reports compliance with insulin.  He is currently NPO. Unable to perform nutrition focused physical exam at this time.  Wt fluctuates between 173-177# over the past year. No significant wt changes noted.  Labs reviewed. Glucose: 302. CBGs: 239-287. Hgb A1c: 11.4, which demonstrates poor control. Pt would benefit from education once condition improves.  Height: Ht Readings from Last 1 Encounters:  09/28/13 6\' 1"  (1.854 m)    Weight: Wt Readings from Last 1 Encounters:  09/29/13 171 lb 8.3 oz (77.8 kg)    Ideal Body Weight: 184#  % Ideal Body Weight: 93%  Wt Readings from Last 10 Encounters:  09/29/13 171 lb 8.3 oz (77.8 kg)  09/11/13 179 lb (81.194 kg)  09/05/13 177 lb (80.287 kg)  08/23/13 185 lb (83.915 kg)  08/19/13 172 lb (78.019 kg)  06/17/13 173 lb 11.6 oz (78.8 kg)  06/17/13 173 lb 11.6 oz (78.8 kg)  07/11/12 180 lb 6.4 oz (81.829 kg)  01/05/12 185 lb (83.915 kg)  01/04/12 185 lb (83.915 kg)    Usual Body Weight: 185#  % Usual Body Weight: 92%  BMI:  Body mass index is 22.63 kg/(m^2).  Estimated Nutritional Needs: Kcal: 1900-2100 Protein: 79-89  grams Fluid: 1.9-2.1 L  Skin: WDL  Diet Order: NPO  EDUCATION NEEDS: -Education not appropriate at this time   Intake/Output Summary (Last 24 hours) at 09/29/13 1040 Last data filed at 09/29/13 0700  Gross per 24 hour  Intake    480 ml  Output   1650 ml  Net  -1170 ml    Last BM: 09/28/13  Labs:   Recent Labs Lab 09/28/13 1506 09/28/13 2042 09/29/13 0505  NA 139 139 140  K 4.7 4.2 4.1  CL 96 96 96  CO2 31 25 21   BUN 13 12 13   CREATININE  0.84 0.71 0.76  CALCIUM 9.6 9.4 9.4  GLUCOSE 273* 251* 302*    CBG (last 3)   Recent Labs  09/28/13 2345 09/29/13 0343 09/29/13 0724  GLUCAP 249* 287* 239*   Lab Results  Component Value Date   HGBA1C 11.4* 09/11/2013   Scheduled Meds: . cloNIDine  0.1 mg Transdermal Weekly  . insulin aspart  0-9 Units Subcutaneous 6 times per day  . insulin glargine  20 Units Subcutaneous BID  . metoCLOPramide (REGLAN) injection  5 mg Intravenous 4 times per day  . ondansetron (ZOFRAN) IV  4 mg Intravenous TID AC, HS, 0200  . pantoprazole (PROTONIX) IV  40 mg Intravenous Q12H    Continuous Infusions: . sodium chloride 150 mL/hr at 09/29/13 [id number]    Past Medical History  Diagnosis Date  . Diabetes mellitus     type 1  . Arthritis   . Back pain   . GERD (gastroesophageal reflux disease)   . PUD (peptic ulcer disease)   . Hypertension   . Heart murmur     valvular stenosis  . Neuropathy   . Diabetic gastroparesis associated with type 1 diabetes mellitus   . S/P arthroscopic knee surgery 07/04/2011  . Scoliosis 07/11/2012    Past Surgical History  Procedure Laterality Date  . Tympanostomy tube placement    . Knee arthroscopy  06/30/2011    Procedure: ARTHROSCOPY KNEE;  Surgeon: Carole Civil, MD;  Location: [facility];  Service: Orthopedics;  Laterality: Right;  diagnostic arthroscopy  . Esophagogastroduodenoscopy (egd) with propofol  06/17/2013    Dr. Oneida Alar: two gastric ulcers in fundus, mild antral gastritis, candida esophagitis  . Esophageal biopsy  06/17/2013    Procedure: GASTRIC ULCER AND ANTRAL BIOPSIES; ESOPHAGEAL BRUSHING;  Surgeon: Danie Binder, MD;  Location: [facility];  Service: Endoscopy;;  . Esophagogastroduodenoscopy (egd) with propofol N/A 09/11/2013    Dr. [name]: abnormal hypopharynx, question massively enlarged tonsils, stomach full of food precluded exam, needs EGD for verification of ulcer healing at a later date    Lotta [place] A. Jimmye Norman, RD, LDN Pager: [phone]

## 2013-09-30 DIAGNOSIS — R1013 Epigastric pain: Secondary | ICD-10-CM

## 2013-09-30 DIAGNOSIS — R1115 Cyclical vomiting syndrome unrelated to migraine: Secondary | ICD-10-CM

## 2013-09-30 LAB — GLUCOSE, CAPILLARY
GLUCOSE-CAPILLARY: 100 mg/dL — AB (ref 70–99)
GLUCOSE-CAPILLARY: 212 mg/dL — AB (ref 70–99)
GLUCOSE-CAPILLARY: 139 mg/dL — AB (ref 70–99)
GLUCOSE-CAPILLARY: 191 mg/dL — AB (ref 70–99)
Glucose-Capillary: 125 mg/dL — ABNORMAL HIGH (ref 70–99)
Glucose-Capillary: 172 mg/dL — ABNORMAL HIGH (ref 70–99)
Glucose-Capillary: 62 mg/dL — ABNORMAL LOW (ref 70–99)

## 2013-09-30 LAB — BASIC METABOLIC PANEL
Anion gap: 14 (ref 5–15)
BUN: 10 mg/dL (ref 6–23)
CHLORIDE: 100 meq/L (ref 96–112)
CO2: 25 meq/L (ref 19–32)
Calcium: 8.7 mg/dL (ref 8.4–10.5)
Creatinine, Ser: 0.65 mg/dL (ref 0.50–1.35)
GFR calc Af Amer: >90 mL/min (ref >90)
GFR calc non Af Amer: >90 mL/min (ref >90)
GLUCOSE: 121 mg/dL — AB (ref 70–99)
Potassium: 3.6 mEq/L — ABNORMAL LOW (ref 3.7–5.3)
SODIUM: 139 meq/L (ref 137–147)

## 2013-09-30 LAB — CBC
HCT: 36.5 % — ABNORMAL LOW (ref 39.0–52.0)
HEMOGLOBIN: 12.3 g/dL — AB (ref 13.0–17.0)
MCH: 30.4 pg (ref 26.0–34.0)
MCHC: 33.7 g/dL (ref 30.0–36.0)
MCV: 90.1 fL (ref 78.0–100.0)
PLATELETS: 259 10*3/uL (ref 150–400)
RBC: 4.05 MIL/uL — AB (ref 4.22–5.81)
RDW: 13.8 % (ref 11.5–15.5)
WBC: 12.2 10*3/uL — ABNORMAL HIGH (ref 4.0–10.5)

## 2013-09-30 LAB — TSH: TSH: 0.904 u[IU]/mL (ref 0.350–4.500)

## 2013-09-30 MED ORDER — INSULIN GLARGINE 100 UNIT/ML ~~LOC~~ SOLN
15.0000 [IU] | Freq: Two times a day (BID) | SUBCUTANEOUS | Status: DC
  Administered 2013-09-30: 15 [IU] via SUBCUTANEOUS
  Filled 2013-09-30 (×4): qty 0.15

## 2013-09-30 MED ORDER — PANTOPRAZOLE SODIUM 40 MG PO TBEC
40.0000 mg | DELAYED_RELEASE_TABLET | Freq: Two times a day (BID) | ORAL | Status: DC
  Administered 2013-09-30 – 2013-10-01 (×3): 40 mg via ORAL
  Filled 2013-09-30 (×3): qty 1

## 2013-09-30 MED ORDER — METOCLOPRAMIDE HCL 5 MG/ML IJ SOLN
5.0000 mg | Freq: Three times a day (TID) | INTRAMUSCULAR | Status: DC
  Administered 2013-09-30 – 2013-10-01 (×3): 5 mg via INTRAVENOUS
  Filled 2013-09-30 (×3): qty 2

## 2013-09-30 MED ORDER — INSULIN ASPART 100 UNIT/ML ~~LOC~~ SOLN: 0.0000 [IU] | Freq: Three times a day (TID) | SUBCUTANEOUS | Status: DC

## 2013-09-30 MED ORDER — ONDANSETRON HCL 4 MG/2ML IJ SOLN
4.0000 mg | Freq: Four times a day (QID) | INTRAMUSCULAR | Status: DC
  Administered 2013-09-30 – 2013-10-01 (×3): 4 mg via INTRAVENOUS
  Filled 2013-09-30 (×3): qty 2

## 2013-09-30 MED ORDER — POTASSIUM CHLORIDE IN NACL 20-0.9 MEQ/L-% IV SOLN
INTRAVENOUS | Status: DC
  Administered 2013-09-30 – 2013-10-01 (×3): via INTRAVENOUS

## 2013-09-30 MED ORDER — LISINOPRIL 10 MG PO TABS
20.0000 mg | ORAL_TABLET | Freq: Every day | ORAL | Status: DC
  Administered 2013-09-30 – 2013-10-01 (×2): 20 mg via ORAL
  Filled 2013-09-30 (×2): qty 2

## 2013-09-30 MED ORDER — INSULIN ASPART 100 UNIT/ML ~~LOC~~ SOLN: 0.0000 [IU] | Freq: Every day | SUBCUTANEOUS | Status: DC

## 2013-09-30 MED ORDER — DEXTROSE 50 % IV SOLN
50.0000 mL | Freq: Once | INTRAVENOUS | Status: AC
  Administered 2013-09-30: 50 mL via INTRAVENOUS

## 2013-09-30 MED ORDER — DEXTROSE 50 % IV SOLN
INTRAVENOUS | Status: AC
  Filled 2013-09-30: qty 50

## 2013-09-30 MED ORDER — POTASSIUM CHLORIDE CRYS ER 10 MEQ PO TBCR
10.0000 meq | EXTENDED_RELEASE_TABLET | Freq: Two times a day (BID) | ORAL | Status: AC
  Administered 2013-09-30 – 2013-10-01 (×2): 10 meq via ORAL
  Filled 2013-09-30 (×2): qty 1

## 2013-09-30 NOTE — Progress Notes (Signed)
Hypoglycemic Event  CBG: 62  Treatment: D50 IV 50 mL  Symptoms: None  Follow-up CBG: Time:1330 CBG Result:212  Possible Reasons for Event: inadequate meal intake   Comments/MD notified: Dr. Caryn Section made aware, patient tolerating clear liquids well, order from MD to also give D 86 IV    Marcell Anger, Marveen Reeks  Remember to initiate Hypoglycemia Order Set & complete

## 2013-09-30 NOTE — Progress Notes (Signed)
Inpatient Diabetes Program Recommendations  AACE/ADA: New Consensus Statement on Inpatient Glycemic Control (2013)  Target Ranges:  Prepandial:   less than 140 mg/dL      Peak postprandial:   less than 180 mg/dL (1-2 hours)      Critically ill patients:  140 - 180 mg/dL  Results for Brent Daniels, Brent Daniels (MRN [id number]) as of 09/30/2013 08:24  Ref. Range 09/29/2013 07:24 09/29/2013 11:07 09/29/2013 16:55 09/29/2013 20:50 09/29/2013 23:56 09/30/2013 05:22 09/30/2013 08:17  Glucose-Capillary Latest Range: 70-99 mg/dL 239 (H) 266 (H) 248 (H) 199 (H) 172 (H) 125 (H) 100 (H)    Diabetes history: Type 1 Outpatient Diabetes medications: 70/30 15 units qam, 70/30 25 units qpm, Novolin R sliding scale Current orders for Inpatient glycemic control: Lantus 20 units BID,Novolog 0-9 units q 4h  Inpatient Diabetes Program Recommendations Insulin - Basal: May want to consider changing basal insulin from Lantus to home  70/30. Correction (SSI): Please consider changing correction frequency to achs. Insulin - Meal Coverage: If Lantus is continued might consider meal coverage 3-4 units tid with meals  Thank you, Lucious Groves RD, CDE

## 2013-09-30 NOTE — Progress Notes (Signed)
[facility] PROGRESS NOTE  Brent Daniels [id number] DOB: 12-17-1985 DOA: 09/28/2013 PCP: [name], MD    Code Status: Full code Family Communication: No family available; discussed with patient Disposition Plan: Discharge to home clinically appropriate   Consultants:  Gastroenterology  Procedures:  None  Antibiotics:  None  HPI/Subjective: The patient has less abdominal pain and less nausea. His diet was advanced by GI and he is tolerating clear liquids.  Objective: Filed Vitals:   09/30/13 1446  BP: 130/76  Pulse: 83  Temp: 98.2 F (36.8 C)  Resp: 20     Intake/Output Summary (Last 24 hours) at 09/30/13 1659 Last data filed at 09/30/13 0520  Gross per 24 hour  Intake   3945 ml  Output   1400 ml  Net   2545 ml   Filed Weights   09/28/13 1157 09/29/13 0500 09/30/13 0610  Weight: 81.647 kg (180 lb) 77.8 kg (171 lb 8.3 oz) 76.386 kg (168 lb 6.4 oz)    Exam:   General:  Alert. Looks much better.   Cardiovascular: S1, S2, with no tachycardia, no rubs or gallops.   Respiratory: clear to auscultation bilaterally.  Abdomen: rare bowel sounds, soft, less tender in the epigastrium and right mid quadrant.   Musculoskeletal: pedal pulses palpable. No pedal edema. No acute hot red joints.   Neuropsychiatric: He is alert and oriented x3. Cranial nerves II through XII are intact.    Data Reviewed: Basic Metabolic Panel:  Recent Labs Lab 09/28/13 1215 09/28/13 1506 09/28/13 2042 09/29/13 0505 09/30/13 0538  NA 137 139 139 140 139  K 4.0 4.7 4.2 4.1 3.6*  CL 87* 96 96 96 100  CO2 29 31 25 21 25   GLUCOSE 444* 273* 251* 302* 121*  BUN 15 13 12 13 10   CREATININE 0.90 0.84 0.71 0.76 0.65  CALCIUM 10.8* 9.6 9.4 9.4 8.7   Liver Function Tests:  Recent Labs Lab 09/28/13 1215 09/29/13 0505  AST 20 15  ALT 20 13  ALKPHOS 111 82  BILITOT 1.8* 1.6*  PROT 9.0* 7.2  ALBUMIN 5.2 4.0    Recent Labs Lab 09/28/13 1215  LIPASE 10*    No results found for this basename: AMMONIA,  in the last 168 hours CBC:  Recent Labs Lab 09/28/13 1215 09/29/13 0505 09/30/13 0538  WBC 15.6* 19.7* 12.2*  NEUTROABS 12.0*  --   --   HGB 16.7 14.0 12.3*  HCT 48.5 41.2 36.5*  MCV 90.1 90.5 90.1  PLT 357 320 259   Cardiac Enzymes: No results found for this basename: CKTOTAL, CKMB, CKMBINDEX, TROPONINI,  in the last 168 hours BNP (last 3 results) No results found for this basename: PROBNP,  in the last 8760 hours CBG:  Recent Labs Lab 09/29/13 2356 09/30/13 0522 09/30/13 0817 09/30/13 1219 09/30/13 1335  GLUCAP 172* 125* 100* 62* 212*    Recent Results (from the past 240 hour(s))  MRSA PCR SCREENING     Status: None   Collection Time    09/28/13  3:43 PM      Result Value Ref Range Status   MRSA by PCR NEGATIVE  NEGATIVE Final   Comment:            The GeneXpert MRSA Assay (FDA     approved for NASAL specimens     only), is one component of a     comprehensive MRSA colonization     surveillance program. It is not     intended to  diagnose MRSA     infection nor to guide or     monitor treatment for     MRSA infections.     Studies: Dg Chest Port 1 View  09/29/2013   CLINICAL DATA:  Elevated white blood cell count.  EXAM: PORTABLE CHEST - 1 VIEW  COMPARISON:  06/15/2013.  FINDINGS: Mediastinum and hilar structures normal. Lungs are clear. Heart size normal. No pleural effusion or pneumothorax. No acute bony abnormality.  IMPRESSION: No acute abnormality .  Stable chest.   Electronically Signed   By: Marcello Moores  Register   On: 09/29/2013 09:45    Scheduled Meds: . cloNIDine  0.1 mg Transdermal Weekly  . insulin aspart  0-9 Units Subcutaneous 6 times per day  . insulin glargine  20 Units Subcutaneous BID  . metoCLOPramide (REGLAN) injection  5 mg Intravenous TID AC & HS  . ondansetron (ZOFRAN) IV  4 mg Intravenous QID  . pantoprazole  40 mg Oral BID AC   Continuous Infusions: . sodium chloride 150 mL/hr at  09/30/13 1043    Principal Problem:   DKA, type 1 Active Problems:   Intractable nausea and vomiting   Diabetic gastroparesis associated with type 1 diabetes mellitus   HTN (hypertension)   Chronic generalized abdominal pain   Hematemesis   Diabetic neuropathy   Leukocytosis, unspecified   Sinus tachycardia  Type 1 diabetes mellitus with DKA.  Now resolved, status post DKA insulin drip protocol and vigorous IV fluids. His blood glucose fell to 62 earlier. We'll change sliding scale NovoLog q. a.c. and each bedtime from every 4 hours. Will decrease Lantus from 20 units twice a day to 12 units twice a day to avoid recurrent hypoglycemia. Intractable nausea, vomiting, low-grade hematemesis, and acute on chronic abdominal pain  Etiology likely multifactorial including DKA, peptic ulcer disease, and diabetic gastroparesis. His lipase and liver transaminases were unremarkable. Per the recent evaluation by gastroenterology, there was a suspicion of gastroparesis as his stomach still had food in it during the exam. He also has a history of peptic ulcer disease. We will continue scheduled IV Reglan, with a possibility of increasing the dose to 10 mg IV every 6 hours. We'll continue IV Protonix every 12 hours. Gastroenterology has evaluated the patient and have scheduled Zofran IV rather than when necessary. Continue as needed IV hydromorphone. Will defer further evaluation endoscopically or radiographically to GI. GI has advanced his diet to clear liquids which she is tolerating well. He is improving clinically and symptomatically. Hypertension. He is treated chronically with lisinopril. His elevated blood pressure may have been the consequence of pain, in part. While he is n.p.o., he was treated with clonidine patch and when necessary IV hydralazine. We'll discontinue the clonidine patch and restart lisinopril Sinus tachycardia. Likely the result of pain and mild volume depletion. Now resolved  following IV fluid hydration and supportive treatment. TSH ordered and pending. Leukocytosis. Likely reactive and is trending down. He has no infective type symptoms. He is afebrile. His chest x-ray reveals no acute findings.  Initial urinalysis was negative for WBCs.   Time spent: 25 minutes.    [facility] Pager [phone]. If 7PM-7AM, please contact night-coverage at [email or web address], password [facility] 09/30/2013, 4:59 PM  LOS: 2 days

## 2013-09-30 NOTE — Progress Notes (Signed)
REVIEWED-NO ADDITIONAL RECOMMENDATIONS. 

## 2013-09-30 NOTE — Progress Notes (Signed)
    Subjective: Vomiting resolved. Nausea improved. Has headache. Wants to try and advance diet. Abdominal pain persistent.   Objective: Vital signs in last 24 hours: Temp:  [98.2 F (36.8 C)-98.4 F (36.9 C)] 98.2 F (36.8 C) (08/25 0517) Pulse Rate:  [88-117] 88 (08/25 0517) Resp:  [18-22] 20 (08/25 0517) BP: (132-160)/(79-94) 135/79 mmHg (08/25 0517) SpO2:  [99 %-100 %] 99 % (08/25 0517) Weight:  [168 lb 6.4 oz (76.386 kg)] 168 lb 6.4 oz (76.386 kg) (08/25 0610) Last BM Date: 09/29/13 General:   Alert and oriented, flat affect Abdomen:  Bowel sounds present, soft, TTP upper abdomen/LUQ Msk:  Symmetrical without gross deformities. Normal posture. Pulses:  Normal pulses noted. Extremities:  Without clubbing or edema. Neurologic:  Alert and  oriented x4;  grossly normal neurologically. Psych:  Alert and cooperative. Normal mood and affect.  Intake/Output from previous day: 08/24 0701 - 08/25 0700 In: 3945 [P.O.:240; I.V.:3705] Out: 2100 [Urine:2100] Intake/Output this shift:    Lab Results:  Recent Labs  09/28/13 1215 09/29/13 0505 09/30/13 0538  WBC 15.6* 19.7* 12.2*  HGB 16.7 14.0 12.3*  HCT 48.5 41.2 36.5*  PLT 357 320 259   BMET  Recent Labs  09/28/13 2042 09/29/13 0505 09/30/13 0538  NA 139 140 139  K 4.2 4.1 3.6*  CL 96 96 100  CO2 25 21 25   GLUCOSE 251* 302* 121*  BUN 12 13 10   CREATININE 0.71 0.76 0.65  CALCIUM 9.4 9.4 8.7   LFT  Recent Labs  09/28/13 1215 09/29/13 0505  PROT 9.0* 7.2  ALBUMIN 5.2 4.0  AST 20 15  ALT 20 13  ALKPHOS 111 82  BILITOT 1.8* 1.6*     Studies/Results: Dg Chest Port 1 View  09/29/2013   CLINICAL DATA:  Elevated white blood cell count.  EXAM: PORTABLE CHEST - 1 VIEW  COMPARISON:  06/15/2013.  FINDINGS: Mediastinum and hilar structures normal. Lungs are clear. Heart size normal. No pleural effusion or pneumothorax. No acute bony abnormality.  IMPRESSION: No acute abnormality .  Stable chest.   Electronically  Signed   By: Marcello Moores  Register   On: 09/29/2013 09:45    Assessment: 28 year old male well-known to our practice from prior admissions and outpatient visits, with history of PUD and likely diabetic gastroparesis. Now with acute on chronic abdominal pain, acute on chronic N/V in the setting of DKA. Scant blood-tinged emesis noted likely secondary to repeated vomiting. No melena or evidence of anemia. Surveillance EGD for PUD attempted in Aug, but stomach was full of food. Elective EGD for surveillance of ulcers can be performed in the outpatient setting unless evidence of anemia, melena, further hematemesis.   Clinically improved with scheduled Reglan and Zofran.  Poorly controlled diabetes as culprit for current presentation. Underlying history of PUD could also contribute. Chronic abdominal pain likely multifactorial in this setting.   Leukocytosis: likely reactive, no concerning signs on exam. At baseline, patient winces with just slight palpation, which is consistent with outpatient evaluations previously.   Plan: PPI BID; switch to oral when tolerating diet Scheduled Zofran IV with meals and bedtime Reglan IV scheduled Phenergan prn No EGD indicated Outpatient EGD for surveillance Clear liquid diet Tight glycemic control  Orvil Feil, ANP-BC [facility]    LOS: 2 days    09/30/2013, 9:23 AM

## 2013-10-01 DIAGNOSIS — E1010 Type 1 diabetes mellitus with ketoacidosis without coma: Secondary | ICD-10-CM

## 2013-10-01 LAB — BASIC METABOLIC PANEL
ANION GAP: 8 (ref 5–15)
BUN: 3 mg/dL — ABNORMAL LOW (ref 6–23)
CALCIUM: 8.6 mg/dL (ref 8.4–10.5)
CO2: 31 mEq/L (ref 19–32)
Chloride: 103 mEq/L (ref 96–112)
Creatinine, Ser: 0.66 mg/dL (ref 0.50–1.35)
GFR calc non Af Amer: >90 mL/min (ref >90)
Glucose, Bld: 120 mg/dL — ABNORMAL HIGH (ref 70–99)
Potassium: 3.5 mEq/L — ABNORMAL LOW (ref 3.7–5.3)
SODIUM: 142 meq/L (ref 137–147)

## 2013-10-01 LAB — GLUCOSE, CAPILLARY
GLUCOSE-CAPILLARY: 75 mg/dL (ref 70–99)
GLUCOSE-CAPILLARY: 107 mg/dL — AB (ref 70–99)
Glucose-Capillary: 65 mg/dL — ABNORMAL LOW (ref 70–99)
Glucose-Capillary: 95 mg/dL (ref 70–99)
Glucose-Capillary: 108 mg/dL — ABNORMAL HIGH (ref 70–99)
Glucose-Capillary: 91 mg/dL (ref 70–99)

## 2013-10-01 LAB — CBC
HEMATOCRIT: 35.6 % — AB (ref 39.0–52.0)
Hemoglobin: 11.7 g/dL — ABNORMAL LOW (ref 13.0–17.0)
MCH: 29.8 pg (ref 26.0–34.0)
MCHC: 32.9 g/dL (ref 30.0–36.0)
MCV: 90.6 fL (ref 78.0–100.0)
PLATELETS: 227 10*3/uL (ref 150–400)
RBC: 3.93 MIL/uL — ABNORMAL LOW (ref 4.22–5.81)
RDW: 13.6 % (ref 11.5–15.5)
WBC: 7.7 10*3/uL (ref 4.0–10.5)

## 2013-10-01 MED ORDER — PANTOPRAZOLE SODIUM 40 MG PO TBEC: 40.0000 mg | DELAYED_RELEASE_TABLET | Freq: Two times a day (BID) | ORAL | Status: DC

## 2013-10-01 MED ORDER — INSULIN GLARGINE 100 UNIT/ML ~~LOC~~ SOLN
10.0000 [IU] | Freq: Once | SUBCUTANEOUS | Status: AC
  Administered 2013-10-01: 10 [IU] via SUBCUTANEOUS
  Filled 2013-10-01: qty 0.1

## 2013-10-01 MED ORDER — ONDANSETRON HCL 4 MG PO TABS
4.0000 mg | ORAL_TABLET | Freq: Three times a day (TID) | ORAL | Status: DC
  Administered 2013-10-01 (×2): 4 mg via ORAL
  Filled 2013-10-01 (×2): qty 1

## 2013-10-01 MED ORDER — OXYCODONE-ACETAMINOPHEN 5-325 MG PO TABS: 1.0000 | ORAL_TABLET | Freq: Three times a day (TID) | ORAL | Status: DC | PRN

## 2013-10-01 MED ORDER — ONDANSETRON HCL 4 MG PO TABS: 4.0000 mg | ORAL_TABLET | Freq: Three times a day (TID) | ORAL | Status: DC

## 2013-10-01 MED ORDER — METOCLOPRAMIDE HCL 5 MG PO TABS: 5.0000 mg | ORAL_TABLET | Freq: Three times a day (TID) | ORAL | Status: DC

## 2013-10-01 MED ORDER — METOCLOPRAMIDE HCL 10 MG PO TABS
5.0000 mg | ORAL_TABLET | Freq: Three times a day (TID) | ORAL | Status: DC
  Administered 2013-10-01 (×2): 5 mg via ORAL
  Filled 2013-10-01 (×2): qty 1

## 2013-10-01 NOTE — Discharge Summary (Signed)
Physician Discharge Summary  Brent Daniels [id number] DOB: [birth year] DOA: 09/28/2013  PCP: [name], MD  Admit date: 09/28/2013 Discharge date: 10/01/2013  Time spent: 40 minutes  Recommendations for Outpatient Follow-up:  1. Patient will follow with gastroenterology next week. He'll be considered for repeat endoscopy 2. Followup primary care physician in one to 2 weeks  Discharge Diagnoses:  Principal Problem:   DKA, type 1 Active Problems:   HTN (hypertension)   Intractable nausea and vomiting   Chronic generalized abdominal pain   Diabetic gastroparesis associated with type 1 diabetes mellitus   Hematemesis   Diabetic neuropathy   Leukocytosis, unspecified   Sinus tachycardia   Discharge Condition: Improved  Diet recommendation: Low carb  Filed Weights   09/29/13 0500 09/30/13 0610 10/01/13 0541  Weight: 77.8 kg (171 lb 8.3 oz) 76.386 kg (168 lb 6.4 oz) 82.283 kg (181 lb 6.4 oz)    History of present illness and hospital course:  This patient was admitted to the hospital with nausea, vomiting abdominal pain. He was found to have an increased anion gap consistent with type I diabetic ketoacidosis. He was admitted to the step down unit and started on intravenous fluids and insulin infusion per DKA protocol. His anion gap was closed and he was transitioned to subcutaneous insulin. For his intractable nausea and vomiting, he was seen by gastroenterology. It was felt that he had a history of peptic ulcer disease which could be contributing to his symptoms. He likely had an element of gastroparesis which improved with administration of Reglan. With Protonix, Reglan, pain management, patient had significant improvement in his symptoms it is now tolerating a diet without vomiting. He does feel ready to discharge home. He will undergo repeat endoscopy as an outpatient since his last endoscopy in 09/2013 with obstructed by remaining food debris in his stomach. The remainder of  his medical issues appear to be stable. He plans on following up with an endocrinologist for further management of his diabetes.  Procedures:    Consultations:  Gastroenterology  Discharge Exam: Filed Vitals:   10/01/13 1355  BP: 131/86  Pulse: 77  Temp: 98.2 F (36.8 C)  Resp: 16    General: No acute distress Cardiovascular: S1, S2, regular rate and rhythm Respiratory: Clear to auscultation bilaterally  Discharge Instructions You were cared for by a hospitalist during your hospital stay. If you have any questions about your discharge medications or the care you received while you were in the hospital after you are discharged, you can call the unit and asked to speak with the hospitalist on call if the hospitalist that took care of you is not available. Once you are discharged, your primary care physician will handle any further medical issues. Please note that NO REFILLS for any discharge medications will be authorized once you are discharged, as it is imperative that you return to your primary care physician (or establish a relationship with a primary care physician if you do not have one) for your aftercare needs so that they can reassess your need for medications and monitor your lab values.  Discharge Instructions   Call MD for:  persistant nausea and vomiting    Complete by:  As directed      Call MD for:  severe uncontrolled pain    Complete by:  As directed      Diet - low sodium heart healthy    Complete by:  As directed      Diet Carb Modified  Complete by:  As directed      Increase activity slowly    Complete by:  As directed             Medication List         gabapentin 600 MG tablet  Commonly known as:  NEURONTIN  Take 1 tablet (600 mg total) by mouth 3 (three) times daily.     insulin NPH-regular Human (70-30) 100 UNIT/ML injection  Commonly known as:  NOVOLIN 70/30  Inject 15-25 Units into the skin 2 (two) times daily with a meal. 15 units in the  morning and 25 at night.     lisinopril 20 MG tablet  Commonly known as:  PRINIVIL,ZESTRIL  Take 20 mg by mouth daily.     metoCLOPramide 5 MG tablet  Commonly known as:  REGLAN  Take 1 tablet (5 mg total) by mouth 4 (four) times daily -  before meals and at bedtime.     ondansetron 4 MG tablet  Commonly known as:  ZOFRAN  Take 1 tablet (4 mg total) by mouth 3 times daily with meals, bedtime and 2 AM.     oxyCODONE-acetaminophen 5-325 MG per tablet  Commonly known as:  ROXICET  Take 1 tablet by mouth every 8 (eight) hours as needed for severe pain.     pantoprazole 40 MG tablet  Commonly known as:  PROTONIX  Take 1 tablet (40 mg total) by mouth 2 (two) times daily before a meal.     PRAVACHOL 20 MG tablet  Generic drug:  pravastatin  Take 20 mg by mouth daily.     promethazine 25 MG tablet  Commonly known as:  PHENERGAN  Take 1 tablet (25 mg total) by mouth every 6 (six) hours as needed for nausea or vomiting.     tiZANidine 4 MG tablet  Commonly known as:  ZANAFLEX  Take 1 tablet by mouth every 8 (eight) hours as needed for muscle spasms.       Allergies  Allergen Reactions  . Hydrocodone Hives  . Ibuprofen Other (See Comments)    Stomach ulcers  . Naproxen Other (See Comments)    Stomach ulcers  . Sulfa Antibiotics Other (See Comments)    Stomach ulcers  . Morphine And Related Rash       Follow-up Information   Follow up with CORRINGTON,KIP A, MD. Schedule an appointment as soon as possible for a visit in 2 weeks.   Specialty:  Family Medicine   Contact information:   [place] [id number] [phone]       Follow up with Manus Rudd, MD. (as scheduled)    Specialty:  Gastroenterology   Contact information:   [place] [address] [place] [address] [phone]        The results of significant diagnostics from this hospitalization (including imaging, microbiology, ancillary and laboratory)  are listed below for reference.    Significant Diagnostic Studies: Dg Chest Port 1 View  09/29/2013   CLINICAL DATA:  Elevated white blood cell count.  EXAM: PORTABLE CHEST - 1 VIEW  COMPARISON:  06/15/2013.  FINDINGS: Mediastinum and hilar structures normal. Lungs are clear. Heart size normal. No pleural effusion or pneumothorax. No acute bony abnormality.  IMPRESSION: No acute abnormality .  Stable chest.   Electronically Signed   By: Marcello Moores  Register   On: 09/29/2013 09:45    Microbiology: Recent Results (from the past 240  hour(s))  MRSA PCR SCREENING     Status: None   Collection Time    09/28/13  3:43 PM      Result Value Ref Range Status   MRSA by PCR NEGATIVE  NEGATIVE Final   Comment:            The GeneXpert MRSA Assay (FDA     approved for NASAL specimens     only), is one component of a     comprehensive MRSA colonization     surveillance program. It is not     intended to diagnose MRSA     infection nor to guide or     monitor treatment for     MRSA infections.     Labs: Basic Metabolic Panel:  Recent Labs Lab 09/28/13 1506 09/28/13 2042 09/29/13 0505 09/30/13 0538 10/01/13 0546  NA 139 139 140 139 142  K 4.7 4.2 4.1 3.6* 3.5*  CL 96 96 96 100 103  CO2 31 25 21 25 31   GLUCOSE 273* 251* 302* 121* 120*  BUN 13 12 13 10  3*  CREATININE 0.84 0.71 0.76 0.65 0.66  CALCIUM 9.6 9.4 9.4 8.7 8.6   Liver Function Tests:  Recent Labs Lab 09/28/13 1215 09/29/13 0505  AST 20 15  ALT 20 13  ALKPHOS 111 82  BILITOT 1.8* 1.6*  PROT 9.0* 7.2  ALBUMIN 5.2 4.0    Recent Labs Lab 09/28/13 1215  LIPASE 10*   No results found for this basename: AMMONIA,  in the last 168 hours CBC:  Recent Labs Lab 09/28/13 1215 09/29/13 0505 09/30/13 0538 10/01/13 0546  WBC 15.6* 19.7* 12.2* 7.7  NEUTROABS 12.0*  --   --   --   HGB 16.7 14.0 12.3* 11.7*  HCT 48.5 41.2 36.5* 35.6*  MCV 90.1 90.5 90.1 90.6  PLT 357 320 259 227   Cardiac Enzymes: No results found for  this basename: CKTOTAL, CKMB, CKMBINDEX, TROPONINI,  in the last 168 hours BNP: BNP (last 3 results) No results found for this basename: PROBNP,  in the last 8760 hours CBG:  Recent Labs Lab 10/01/13 0251 10/01/13 0558 10/01/13 0741 10/01/13 1136 10/01/13 1649  GLUCAP 95 108* 75 91 107*       Signed:  MEMON,JEHANZEB  [facility] 10/01/2013, 9:13 PM

## 2013-10-01 NOTE — Progress Notes (Signed)
    Subjective: No N/V. Baseline abdominal pain. Desires to go home soon.   Objective: Vital signs in last 24 hours: Temp:  [98.2 F (36.8 C)-98.5 F (36.9 C)] 98.5 F (36.9 C) (08/26 0541) Pulse Rate:  [78-83] 78 (08/26 0541) Resp:  [20] 20 (08/26 0541) BP: (130-142)/(76-86) 130/77 mmHg (08/26 0541) SpO2:  [99 %-100 %] 99 % (08/26 0541) Weight:  [181 lb 6.4 oz (82.283 kg)] 181 lb 6.4 oz (82.283 kg) (08/26 0541) Last BM Date: 09/28/13 General:   Alert and oriented, pleasant Abdomen:  Bowel sounds present, soft, TTP LUQ with just light touch. Out of proportion to physical exam. Baseline for patient. Extremities:  Without clubbing or edema. Neurologic:  Alert and  oriented x4;  grossly normal neurologically.   Intake/Output from previous day: 08/25 0701 - 08/26 0700 In: [id number] [P.O.:720; I.V.:1000] Out: 2450 [Urine:2450] Intake/Output this shift:    Lab Results:  Recent Labs  09/29/13 0505 09/30/13 0538 10/01/13 0546  WBC 19.7* 12.2* 7.7  HGB 14.0 12.3* 11.7*  HCT 41.2 36.5* 35.6*  PLT 320 259 227   BMET  Recent Labs  09/29/13 0505 09/30/13 0538 10/01/13 0546  NA 140 139 142  K 4.1 3.6* 3.5*  CL 96 100 103  CO2 21 25 31   GLUCOSE 302* 121* 120*  BUN 13 10 3*  CREATININE 0.76 0.65 0.66  CALCIUM 9.4 8.7 8.6   LFT  Recent Labs  09/28/13 1215 09/29/13 0505  PROT 9.0* 7.2  ALBUMIN 5.2 4.0  AST 20 15  ALT 20 13  ALKPHOS 111 53  BILITOT 1.8* 1.6*       Assessment: 28 year old male well-known to our practice from prior admissions and outpatient visits, with history of PUD and likely diabetic gastroparesis. Admitted with acute on chronic abdominal pain, acute on chronic N/V in the setting of DKA. Scant blood-tinged emesis noted likely secondary to repeated vomiting. No melena or evidence of anemia. Surveillance EGD for PUD attempted in Aug, but stomach was full of food. Elective EGD for surveillance of ulcers to be performed as outpatient. Clinically  improved with scheduled Reglan and Zofran. Poorly controlled diabetes as culprit for current presentation. Underlying history of PUD could also contribute. Chronic abdominal pain likely multifactorial in this setting. At baseline.   Plan: Continue PPI BID Scheduled Zofran with meals Reglan scheduled with meals Phenergan prn EGD as outpatient for surveillance Advance diet Tight glycemic control Stable from GI standpoint.  Orvil Feil, ANP-BC [facility]    LOS: 3 days    10/01/2013, 7:52 AM

## 2013-10-01 NOTE — Discharge Instructions (Signed)

## 2013-10-02 NOTE — Progress Notes (Signed)
UR review complete.  

## 2013-10-09 ENCOUNTER — Encounter: Payer: Self-pay | Admitting: Gastroenterology

## 2013-10-14 ENCOUNTER — Encounter (HOSPITAL_COMMUNITY): Payer: Self-pay | Admitting: Emergency Medicine

## 2013-10-14 ENCOUNTER — Emergency Department (HOSPITAL_COMMUNITY)
Admission: EM | Admit: 2013-10-14 | Discharge: 2013-10-14 | Disposition: A | Discharge status: Home / Self Care | Payer: Managed Care, Other (non HMO) | Attending: Emergency Medicine | Admitting: Emergency Medicine

## 2013-10-14 ENCOUNTER — Emergency Department (HOSPITAL_COMMUNITY): Payer: Managed Care, Other (non HMO)

## 2013-10-14 DIAGNOSIS — R112 Nausea with vomiting, unspecified: Secondary | ICD-10-CM | POA: Diagnosis present

## 2013-10-14 DIAGNOSIS — Z794 Long term (current) use of insulin: Secondary | ICD-10-CM | POA: Diagnosis not present

## 2013-10-14 DIAGNOSIS — R1110 Vomiting, unspecified: Secondary | ICD-10-CM

## 2013-10-14 DIAGNOSIS — K219 Gastro-esophageal reflux disease without esophagitis: Secondary | ICD-10-CM | POA: Diagnosis not present

## 2013-10-14 DIAGNOSIS — E1049 Type 1 diabetes mellitus with other diabetic neurological complication: Secondary | ICD-10-CM | POA: Insufficient documentation

## 2013-10-14 DIAGNOSIS — R011 Cardiac murmur, unspecified: Secondary | ICD-10-CM | POA: Diagnosis not present

## 2013-10-14 DIAGNOSIS — Z79899 Other long term (current) drug therapy: Secondary | ICD-10-CM | POA: Diagnosis not present

## 2013-10-14 DIAGNOSIS — Z8711 Personal history of peptic ulcer disease: Secondary | ICD-10-CM | POA: Insufficient documentation

## 2013-10-14 DIAGNOSIS — M129 Arthropathy, unspecified: Secondary | ICD-10-CM | POA: Diagnosis not present

## 2013-10-14 DIAGNOSIS — Z8669 Personal history of other diseases of the nervous system and sense organs: Secondary | ICD-10-CM | POA: Diagnosis not present

## 2013-10-14 DIAGNOSIS — I10 Essential (primary) hypertension: Secondary | ICD-10-CM | POA: Diagnosis not present

## 2013-10-14 DIAGNOSIS — R1115 Cyclical vomiting syndrome unrelated to migraine: Secondary | ICD-10-CM | POA: Insufficient documentation

## 2013-10-14 DIAGNOSIS — Z87891 Personal history of nicotine dependence: Secondary | ICD-10-CM | POA: Insufficient documentation

## 2013-10-14 DIAGNOSIS — K3184 Gastroparesis: Secondary | ICD-10-CM | POA: Insufficient documentation

## 2013-10-14 LAB — CBC WITH DIFFERENTIAL/PLATELET
BASOS ABS: 0.1 10*3/uL (ref 0.0–0.1)
Basophils Relative: 1 % (ref 0–1)
EOS ABS: 0.1 10*3/uL (ref 0.0–0.7)
Eosinophils Relative: 1 % (ref 0–5)
HCT: 45.9 % (ref 39.0–52.0)
Hemoglobin: 15.8 g/dL (ref 13.0–17.0)
Lymphocytes Relative: 18 % (ref 12–46)
Lymphs Abs: 2 10*3/uL (ref 0.7–4.0)
MCH: 30.2 pg (ref 26.0–34.0)
MCHC: 34.4 g/dL (ref 30.0–36.0)
MCV: 87.8 fL (ref 78.0–100.0)
Monocytes Absolute: 0.5 10*3/uL (ref 0.1–1.0)
Monocytes Relative: 5 % (ref 3–12)
NEUTROS PCT: 75 % (ref 43–77)
Neutro Abs: 8.5 10*3/uL — ABNORMAL HIGH (ref 1.7–7.7)
PLATELETS: 350 10*3/uL (ref 150–400)
RBC: 5.23 MIL/uL (ref 4.22–5.81)
RDW: 13.5 % (ref 11.5–15.5)
WBC: 11.3 10*3/uL — ABNORMAL HIGH (ref 4.0–10.5)

## 2013-10-14 LAB — COMPREHENSIVE METABOLIC PANEL
ALK PHOS: 105 U/L (ref 39–117)
ALT: 16 U/L (ref 0–53)
AST: 24 U/L (ref 0–37)
Albumin: 4.5 g/dL (ref 3.5–5.2)
Anion gap: 16 — ABNORMAL HIGH (ref 5–15)
BILIRUBIN TOTAL: 0.9 mg/dL (ref 0.3–1.2)
BUN: 17 mg/dL (ref 6–23)
CHLORIDE: 90 meq/L — AB (ref 96–112)
CO2: 31 meq/L (ref 19–32)
CREATININE: 0.81 mg/dL (ref 0.50–1.35)
Calcium: 10.3 mg/dL (ref 8.4–10.5)
GFR calc non Af Amer: >90 mL/min (ref >90)
GLUCOSE: 334 mg/dL — AB (ref 70–99)
POTASSIUM: 4.1 meq/L (ref 3.7–5.3)
Sodium: 137 mEq/L (ref 137–147)
Total Protein: 8.4 g/dL — ABNORMAL HIGH (ref 6.0–8.3)

## 2013-10-14 LAB — TYPE AND SCREEN
ABO/RH(D): A POS
Antibody Screen: NEGATIVE

## 2013-10-14 LAB — LIPASE, BLOOD: Lipase: 9 U/L — ABNORMAL LOW (ref 11–59)

## 2013-10-14 MED ORDER — SODIUM CHLORIDE 0.9 % IV SOLN
1000.0000 mL | INTRAVENOUS | Status: DC
  Administered 2013-10-14: 1000 mL via INTRAVENOUS

## 2013-10-14 MED ORDER — METOCLOPRAMIDE HCL 10 MG PO TABS: 10.0000 mg | ORAL_TABLET | Freq: Four times a day (QID) | ORAL | Status: DC

## 2013-10-14 MED ORDER — GI COCKTAIL ~~LOC~~
30.0000 mL | Freq: Once | ORAL | Status: AC
  Administered 2013-10-14: 30 mL via ORAL
  Filled 2013-10-14: qty 30

## 2013-10-14 MED ORDER — ONDANSETRON HCL 4 MG/2ML IJ SOLN
4.0000 mg | Freq: Once | INTRAMUSCULAR | Status: AC
  Administered 2013-10-14: 4 mg via INTRAVENOUS
  Filled 2013-10-14: qty 2

## 2013-10-14 MED ORDER — METOCLOPRAMIDE HCL 5 MG/ML IJ SOLN
10.0000 mg | Freq: Once | INTRAMUSCULAR | Status: AC
  Administered 2013-10-14: 10 mg via INTRAVENOUS
  Filled 2013-10-14: qty 2

## 2013-10-14 MED ORDER — SODIUM CHLORIDE 0.9 % IV SOLN
1000.0000 mL | Freq: Once | INTRAVENOUS | Status: AC
  Administered 2013-10-14: 1000 mL via INTRAVENOUS

## 2013-10-14 MED ORDER — FENTANYL CITRATE 0.05 MG/ML IJ SOLN
100.0000 ug | Freq: Once | INTRAMUSCULAR | Status: AC
  Administered 2013-10-14: 100 ug via INTRAVENOUS
  Filled 2013-10-14: qty 2

## 2013-10-14 NOTE — ED Notes (Signed)
Pt requested that pain medication be pushed into the closest port to pt and as fast as possible. Informed pt that would not be taking place.

## 2013-10-14 NOTE — ED Notes (Signed)
Pt c/o n/v and lower abd pain since yesterday. Pt states he is vomiting blood. Pt has small amount of emesis in bag that is brown. Pt stated he has no test strips to test sugar with. Pt mm moist. nad at this time. deneis diarrhea.

## 2013-10-14 NOTE — ED Notes (Signed)
Pt actively vomiting brown vomitus. Requesting more nausea medication EDP aware

## 2013-10-14 NOTE — ED Notes (Signed)
Pt constantly dry heaving with intermittent vomiting in triage. Pt unable to take temp at this time.

## 2013-10-14 NOTE — ED Notes (Signed)
Pt actively vomiting in triage 

## 2013-10-14 NOTE — ED Notes (Signed)
Pt given ice chips

## 2013-10-14 NOTE — Discharge Instructions (Signed)

## 2013-10-14 NOTE — ED Notes (Signed)
Pt refusing occult blood collection at this time, states "There is no blood in my stool. I have had two bowel movements since I've been here."

## 2013-10-14 NOTE — ED Notes (Signed)
Pt states he has not been able to check his blood sugar because he is out of strips and does not have money to buy them

## 2013-10-14 NOTE — ED Provider Notes (Signed)
CSN: [id number]     Arrival date & time 10/14/13  1640 History  This chart was scribed for Dorie Rank, MD by Delphia Grates, ED Scribe. This patient was seen in room [facility] and the patient's care was started at 5:10 PM.    Chief Complaint  Patient presents with  . Emesis   The history is provided by the patient. No language interpreter was used.    HPI Comments: Brent Daniels is a 28 y.o. male who presents to the Emergency Department complaining of intermittent, emesis since yesterday. Patient reports history of PUD and suspects this may be related. There is associated upper abdominal pain that is severe and hematemesis. Patient denies fever, LOC, cough, diarrhea. Patient has history of DM, but has been unable to check his sugar in the last week (ran out of test strips). In addition to DM and PUD,  patient also has history of arthritis, back pain, GERD, HTN, neuropathy, and scoliosis. He denies any problems with his pancreas or gallbladder. Patient is allergic to Tylenol, ibuprofen, naproxen, sulfa abx, hydrocodone, and morphine.  Past Medical History  Diagnosis Date  . Diabetes mellitus     type 1  . Arthritis   . Back pain   . GERD (gastroesophageal reflux disease)   . PUD (peptic ulcer disease)   . Hypertension   . Heart murmur     valvular stenosis  . Neuropathy   . Diabetic gastroparesis associated with type 1 diabetes mellitus   . S/P arthroscopic knee surgery 07/04/2011  . Scoliosis 07/11/2012   Past Surgical History  Procedure Laterality Date  . Tympanostomy tube placement    . Knee arthroscopy  06/30/2011    Procedure: ARTHROSCOPY KNEE;  Surgeon: Carole Civil, MD;  Location: [facility];  Service: Orthopedics;  Laterality: Right;  diagnostic arthroscopy  . Esophagogastroduodenoscopy (egd) with propofol  06/17/2013    Dr. Oneida Alar: two gastric ulcers in fundus, mild antral gastritis, candida esophagitis  . Esophageal biopsy  06/17/2013    Procedure: GASTRIC ULCER AND  ANTRAL BIOPSIES; ESOPHAGEAL BRUSHING;  Surgeon: Danie Binder, MD;  Location: [facility];  Service: Endoscopy;;  . Esophagogastroduodenoscopy (egd) with propofol N/A 09/11/2013    Dr. [name]: abnormal hypopharynx, question massively enlarged tonsils, stomach full of food precluded exam, needs EGD for verification of ulcer healing at a later date   Family History  Problem Relation Age of Onset  . Cancer Father   . Alcohol abuse Father   . Pseudochol deficiency Neg Hx   . Malignant hyperthermia Neg Hx   . Hypotension Neg Hx   . Anesthesia problems Neg Hx   . Colon cancer Neg Hx    History  Substance Use Topics  . Smoking status: Former Smoker -- 0.50 packs/day for 10 years    Types: Cigarettes    Quit date: 10/07/2013  . Smokeless tobacco: Former Systems developer    Quit date: 02/17/2013     Comment: smokes 4 cigarettes a day   . Alcohol Use: No    Review of Systems  All other systems reviewed and are negative.  A complete 10 system review of systems was obtained and all systems are negative except as noted in the HPI and PMH.     Allergies  Hydrocodone; Ibuprofen; Naproxen; Sulfa antibiotics; Tramadol; and Morphine and related  Home Medications   Prior to Admission medications   Medication Sig Start Date End Date Taking? Authorizing Provider  gabapentin (NEURONTIN) 800 MG tablet Take 800 mg by mouth  3 (three) times daily.   Yes Historical Provider, MD  lisinopril (PRINIVIL,ZESTRIL) 20 MG tablet Take 20 mg by mouth daily.   Yes Historical Provider, MD  insulin NPH-regular Human (NOVOLIN 70/30) (70-30) 100 UNIT/ML injection Inject 15-25 Units into the skin 2 (two) times daily with a meal. 15 units in the morning and 25 at night.    Historical Provider, MD  metoCLOPramide (REGLAN) 5 MG tablet Take 1 tablet (5 mg total) by mouth 4 (four) times daily -  before meals and at bedtime. 10/01/13   Kathie Dike, MD  ondansetron (ZOFRAN) 4 MG tablet Take 1 tablet (4 mg total) by mouth 3 times daily with  meals, bedtime and 2 AM. 10/01/13   Kathie Dike, MD  oxyCODONE-acetaminophen (ROXICET) 5-325 MG per tablet Take 1 tablet by mouth every 8 (eight) hours as needed for severe pain. 10/01/13   Kathie Dike, MD  pantoprazole (PROTONIX) 40 MG tablet Take 1 tablet (40 mg total) by mouth 2 (two) times daily before a meal. 10/01/13   Kathie Dike, MD  pravastatin (PRAVACHOL) 20 MG tablet Take 20 mg by mouth daily.  09/10/13 09/10/14  Historical Provider, MD  promethazine (PHENERGAN) 25 MG tablet Take 1 tablet (25 mg total) by mouth every 6 (six) hours as needed for nausea or vomiting. [id number]   Delora Fuel, MD  tiZANidine (ZANAFLEX) 4 MG tablet Take 1 tablet by mouth every 8 (eight) hours as needed for muscle spasms.  07/30/13   Historical Provider, MD   Triage Vitals: BP 150/113  Pulse 123  Resp 20  Ht 6\' 1"  (1.854 m)  Wt 180 lb (81.647 kg)  BMI 23.75 kg/m2  SpO2 100%  Physical Exam  Nursing note and vitals reviewed. Constitutional: He appears well-developed and well-nourished. He appears distressed.  HENT:  Head: Normocephalic and atraumatic.  Right Ear: External ear normal.  Left Ear: External ear normal.  Mouth/Throat: No oropharyngeal exudate.  Eyes: Conjunctivae are normal. Right eye exhibits no discharge. Left eye exhibits no discharge. No scleral icterus.  Neck: Neck supple. No tracheal deviation present.  Cardiovascular: Normal rate, regular rhythm and intact distal pulses.   Pulmonary/Chest: Effort normal and breath sounds normal. No stridor. No respiratory distress. He has no wheezes. He has no rales.  Abdominal: Soft. Bowel sounds are normal. He exhibits no distension. There is tenderness in the epigastric area. There is no rebound and no guarding.  Actively vomiting   Musculoskeletal: He exhibits no edema and no tenderness.  Neurological: He is alert. He has normal strength. No cranial nerve deficit (no facial droop, extraocular movements intact, no slurred speech) or sensory  deficit. He exhibits normal muscle tone. He displays no seizure activity. Coordination normal.  Skin: Skin is warm and dry. No rash noted.  Psychiatric: He has a normal mood and affect.    ED Course  Procedures (including critical care time)  DIAGNOSTIC STUDIES: Oxygen Saturation is 100% on room air, normal by my interpretation.    COORDINATION OF CARE: At [id number] Discussed treatment plan with patient which includes imaging, labs, and IV fluids. Patient agrees.   7:31 PM- Labs delayed. Currently awaiting results.   Labs Review Labs Reviewed  CBC WITH DIFFERENTIAL  BASIC METABOLIC PANEL  CBG MONITORING, ED  CBC nl except wbc 11.2, GLuc 334, cl 90, bicarb 31, cr 0.81, bun 17 UA nl, lipase normal  Imaging Review Dg Abd Acute W/chest  10/14/2013   CLINICAL DATA:  Pain.  EXAM: ACUTE ABDOMEN SERIES (ABDOMEN 2 VIEW &  CHEST 1 VIEW)  COMPARISON:  Chest x-ray 05/26/2013.  CT abdomen 08/23/2013.  FINDINGS: Soft tissue structures are unremarkable. The gas pattern is nonspecific. Several nondistended air-filled loops of small bowel noted, this is nonspecific. Pelvic phleboliths. No acute bony abnormality. No acute cardiopulmonary disease.  IMPRESSION: Negative abdominal radiographs.  No acute cardiopulmonary disease.   Electronically Signed   By: Marcello Moores  Register   On: 10/14/2013 17:46      MDM   Final diagnoses:  Intractable vomiting with nausea, vomiting of unspecified type    Patient improved with IV fluids and pain medications eventually was able to tolerate oral fluids. The patient's x-rays did not show any signs of obstruction. His laboratory tests did not show any acidosis to suggest DKA.  I suspect his symptoms could be related to his recurrent gastritis and gastroparesis.  At this time there does not appear to be any evidence of an acute emergency medical condition and the patient appears stable for discharge with appropriate outpatient follow up.   I personally performed the  services described in this documentation, which was scribed in my presence.  The recorded information has been reviewed and is accurate.    Dorie Rank, MD 10/14/13 [phone]

## 2013-10-15 LAB — URINALYSIS, ROUTINE W REFLEX MICROSCOPIC
BILIRUBIN URINE: NEGATIVE
Glucose, UA: >1000 mg/dL — AB
HGB URINE DIPSTICK: NEGATIVE
Ketones, ur: 40 mg/dL — AB
Leukocytes, UA: NEGATIVE
NITRITE: NEGATIVE
Protein, ur: NEGATIVE mg/dL
SPECIFIC GRAVITY, URINE: 1.01 (ref 1.005–1.030)
UROBILINOGEN UA: 0.2 mg/dL (ref 0.0–1.0)
pH: 6 (ref 5.0–8.0)

## 2013-10-15 LAB — URINE MICROSCOPIC-ADD ON

## 2013-10-22 ENCOUNTER — Telehealth: Payer: Self-pay | Admitting: Internal Medicine

## 2013-10-22 NOTE — Telephone Encounter (Signed)
PATIENT CALLED STATING HIS STOMACH ISSUES ARE WORSE AND WANTS TO KNOW IF HE CAN GET IN SOONER TO BE SEEN.  [place]

## 2013-10-23 NOTE — Telephone Encounter (Signed)
Please get him a sooner appt. According to his discharge instructions from 09/11/13, pt should have had a follow up office visit in 1 month.

## 2013-10-24 NOTE — Telephone Encounter (Signed)
I GAVE HIM AN OPEN APPT FOR NEXT WEEK .  HE IS AWARE.

## 2013-10-29 ENCOUNTER — Other Ambulatory Visit: Payer: Self-pay

## 2013-10-29 ENCOUNTER — Encounter (HOSPITAL_COMMUNITY): Payer: Self-pay | Admitting: Pharmacy Technician

## 2013-10-29 ENCOUNTER — Encounter: Payer: Self-pay | Admitting: Gastroenterology

## 2013-10-29 ENCOUNTER — Ambulatory Visit (INDEPENDENT_AMBULATORY_CARE_PROVIDER_SITE_OTHER): Payer: Managed Care, Other (non HMO) | Admitting: Gastroenterology

## 2013-10-29 VITALS — BP 147/105 | HR 112 | Temp 97.9°F | Ht 73.0 in | Wt 174.0 lb

## 2013-10-29 DIAGNOSIS — K3184 Gastroparesis: Secondary | ICD-10-CM

## 2013-10-29 DIAGNOSIS — K279 Peptic ulcer, site unspecified, unspecified as acute or chronic, without hemorrhage or perforation: Secondary | ICD-10-CM | POA: Insufficient documentation

## 2013-10-29 DIAGNOSIS — R1013 Epigastric pain: Secondary | ICD-10-CM

## 2013-10-29 MED ORDER — OXYCODONE-ACETAMINOPHEN 5-325 MG PO TABS: 1.0000 | ORAL_TABLET | ORAL | Status: DC | PRN

## 2013-10-29 NOTE — Patient Instructions (Addendum)
1. If you continue to have fast heart rate, 115-120, consider ER evaluation. 2. Upper endoscopy to recheck ulcer. You need to take Reglan four times a day every day for now. Full liquids 3 days before and clear liquids 2 days before your procedure. Adjust your insulin accordingly.

## 2013-10-29 NOTE — Progress Notes (Signed)
Primary Care Physician:  [name], MD  Primary Gastroenterologist:  Garfield Cornea, MD   Chief Complaint  Patient presents with  . Emesis    gastroparesis  . Peptic Ulcer Disease    x 6 months    HPI:  Brent Daniels is a 28 y.o. male here for followup. History of EGD back in May with 2 gastric ulcers, mild antral gastritis, candida esophagitis. Attempted followup EGD in August to verify ulcer healing. Unfortunately his stomach was full of food felt to be related to gastroparesis. Noted to have abnormal hypopharynx possibly massively enlarged tonsils. Recommended that he discuss with PCP and consider ENT referral at this was not done.   Since the attempted EGD last month, he has been seen in ER on multiple occasions and admitted for intractable N/V, DKA, hematemesis. C/O ongoing N/V at least twice per day. Pain in abd excruciating. Eating frequently even without appetite due to DM. Hematemesis few weeks ago. Taking CBGs 12 times per day. Epigastric/LLQ pain. BM once per week, has always been his norm. Soft/brown. No melena, brbpr. No ASA/NSAIDs. No drugs or etoh.   Current Outpatient Prescriptions  Medication Sig Dispense Refill  . gabapentin (NEURONTIN) 800 MG tablet Take 800 mg by mouth 3 (three) times daily.      . insulin NPH-regular Human (NOVOLIN 70/30) (70-30) 100 UNIT/ML injection Inject 15-25 Units into the skin 2 (two) times daily with a meal. 15 units in the morning and 25 at night.      Marland Kitchen lisinopril (PRINIVIL,ZESTRIL) 20 MG tablet Take 20 mg by mouth daily.      . metoCLOPramide (REGLAN) 10 MG tablet Take 1 tablet (10 mg total) by mouth every 6 (six) hours.  30 tablet  0  . ondansetron (ZOFRAN) 4 MG tablet Take 1 tablet (4 mg total) by mouth 3 times daily with meals, bedtime and 2 AM.  20 tablet  0  . pantoprazole (PROTONIX) 40 MG tablet Take 1 tablet (40 mg total) by mouth 2 (two) times daily before a meal.  60 tablet  2  . pravastatin (PRAVACHOL) 20 MG tablet Take 20 mg  by mouth daily.       Marland Kitchen tiZANidine (ZANAFLEX) 4 MG tablet Take 1 tablet by mouth every 8 (eight) hours as needed for muscle spasms.        No current facility-administered medications for this visit.    Allergies as of 10/29/2013 - Review Complete 10/29/2013  Allergen Reaction Noted  . Hydrocodone Hives 08/23/2013  . Ibuprofen Other (See Comments) 08/14/2011  . Naproxen Other (See Comments) 08/14/2011  . Sulfa antibiotics Other (See Comments) 08/14/2011  . Tramadol  10/14/2013  . Morphine and related Rash 01/04/2012    Past Medical History  Diagnosis Date  . Diabetes mellitus     type 1  . Arthritis   . Back pain   . GERD (gastroesophageal reflux disease)   . PUD (peptic ulcer disease)   . Hypertension   . Heart murmur     valvular stenosis  . Neuropathy   . Diabetic gastroparesis associated with type 1 diabetes mellitus   . S/P arthroscopic knee surgery 07/04/2011  . Scoliosis 07/11/2012    Past Surgical History  Procedure Laterality Date  . Tympanostomy tube placement    . Knee arthroscopy  06/30/2011    Procedure: ARTHROSCOPY KNEE;  Surgeon: Carole Civil, MD;  Location: [facility];  Service: Orthopedics;  Laterality: Right;  diagnostic arthroscopy  . Esophagogastroduodenoscopy (egd) with propofol  06/17/2013    Dr. Oneida Alar: two gastric ulcers in fundus, mild antral gastritis, candida esophagitis  . Esophageal biopsy  06/17/2013    Procedure: GASTRIC ULCER AND ANTRAL BIOPSIES; ESOPHAGEAL BRUSHING;  Surgeon: Danie Binder, MD;  Location: [facility];  Service: Endoscopy;;  . Esophagogastroduodenoscopy (egd) with propofol N/A 09/11/2013    Dr. [name]: abnormal hypopharynx, question massively enlarged tonsils, stomach full of food precluded exam, needs EGD for verification of ulcer healing at a later date    Family History  Problem Relation Age of Onset  . Cancer Father   . Alcohol abuse Father   . Pseudochol deficiency Neg Hx   . Malignant hyperthermia Neg Hx   . Hypotension  Neg Hx   . Anesthesia problems Neg Hx   . Colon cancer Neg Hx     History   Social History  . Marital Status: Single    Spouse Name: N/A    Number of Children: N/A  . Years of Education: 12   Occupational History  . unemployed     trying to get disability   Social History Main Topics  . Smoking status: Current Some Day Smoker -- 0.50 packs/day for 10 years    Types: Cigarettes    Last Attempt to Quit: 10/07/2013  . Smokeless tobacco: Former Systems developer    Quit date: 02/17/2013     Comment: smokes 4 cigarettes a day   . Alcohol Use: No  . Drug Use: No  . Sexual Activity: Not on file   Other Topics Concern  . Not on file   Social History Narrative  . No narrative on file      ROS:  General: Negative for anorexia, weight loss, fever, chills, fatigue, weakness. Eyes: Negative for vision changes.  ENT: Negative for hoarseness, difficulty swallowing , nasal congestion. CV: Negative for chest pain, angina, palpitations, dyspnea on exertion, peripheral edema.  Respiratory: Negative for dyspnea at rest, dyspnea on exertion, cough, sputum, wheezing.  GI: See history of present illness. GU:  Negative for dysuria, hematuria, urinary incontinence, urinary frequency, nocturnal urination.  MS: Negative for joint pain, low back pain.  Derm: Negative for rash or itching.  Neuro: Negative for weakness, abnormal sensation, seizure, frequent headaches, memory loss, confusion.  Psych: Negative for anxiety, depression, suicidal ideation, hallucinations.  Endo: Negative for unusual weight change.  Heme: Negative for bruising or bleeding. Allergy: Negative for rash or hives.    Physical Examination:  BP 147/105  Pulse 112  Temp(Src) 97.9 F (36.6 C) (Oral)  Ht 6\' 1"  (1.854 m)  Wt 174 lb (78.926 kg)  BMI 22.96 kg/m2   General: Well-nourished, well-developed in no acute distress.  Head: Normocephalic, atraumatic.   Eyes: Conjunctiva pink, no icterus. Mouth: Oropharyngeal mucosa  moist and pink , no lesions erythema or exudate. Neck: Supple without thyromegaly, masses, or lymphadenopathy.  Lungs: Clear to auscultation bilaterally.  Heart: Slight tachycardia Regular rhythm, no murmurs rubs or gallops.  Abdomen: Bowel sounds are normal, moderate epigastric tenderness, nondistended, no hepatosplenomegaly or masses, no abdominal bruits or    hernia , no rebound or guarding.   Rectal: Not performed Extremities: No lower extremity edema. No clubbing or deformities.  Neuro: Alert and oriented x 4 , grossly normal neurologically.  Skin: Warm and dry, no rash or jaundice.   Psych: Alert and cooperative, normal mood and affect.  Labs: Lab Results  Component Value Date   WBC 11.3* 10/14/2013   HGB 15.8 10/14/2013   HCT 45.9 10/14/2013  MCV 87.8 10/14/2013   PLT 350 10/14/2013   Lab Results  Component Value Date   CREATININE 0.81 10/14/2013   BUN 17 10/14/2013   NA 137 10/14/2013   K 4.1 10/14/2013   CL 90* 10/14/2013   CO2 31 10/14/2013   Lab Results  Component Value Date   ALT 16 10/14/2013   AST 24 10/14/2013   ALKPHOS 105 10/14/2013   BILITOT 0.9 10/14/2013   Lab Results  Component Value Date   LIPASE 9* 10/14/2013     Imaging Studies: Dg Abd Acute W/chest  10/14/2013   CLINICAL DATA:  Pain.  EXAM: ACUTE ABDOMEN SERIES (ABDOMEN 2 VIEW & CHEST 1 VIEW)  COMPARISON:  Chest x-ray 05/26/2013.  CT abdomen 08/23/2013.  FINDINGS: Soft tissue structures are unremarkable. The gas pattern is nonspecific. Several nondistended air-filled loops of small bowel noted, this is nonspecific. Pelvic phleboliths. No acute bony abnormality. No acute cardiopulmonary disease.  IMPRESSION: Negative abdominal radiographs.  No acute cardiopulmonary disease.   Electronically Signed   By: Marcello Moores  Register   On: 10/14/2013 17:46   CT abdomen pelvis with contrast 08/23/2013 unremarkable

## 2013-10-31 NOTE — Assessment & Plan Note (Addendum)
28 year old gentleman with diabetes mellitus, suspected gastroparesis, peptic ulcer disease who presents for followup. Continues to have ongoing symptoms with daily vomiting and abdominal pain. Due for surveillance EGD, last month EGD precluded by retained gastric contents and was incomplete exam. Needs upper endoscopy this time to further evaluate ongoing symptoms and check for ulcer healing. Gallbladder remains in situ.  EGD with deep sedation given history of previous sedation requirements.  I have discussed the risks, alternatives, benefits with regards to but not limited to the risk of reaction to medication, bleeding, infection, perforation and the patient is agreeable to proceed. Written consent to be obtained.  He will take Reglan QID for now. Fulls/soft mechanical diet three days before EGD.  Clear liquids for two days prior to procedure. Insulin adjusted accordingly. He will recheck his pulse later today and if remains tachycardic, consider ER evaluation. Roxicet provided for abdominal pain, advised to take for severe pain only due to gastroparesis.

## 2013-11-06 NOTE — Progress Notes (Signed)
cc'ed to pcp °

## 2013-11-06 NOTE — Pre-Procedure Instructions (Signed)
Second attempt made to contact patient by phone for interview.

## 2013-11-07 ENCOUNTER — Encounter (HOSPITAL_COMMUNITY)
Admission: RE | Admit: 2013-11-07 | Discharge: 2013-11-07 | Disposition: A | Discharge status: Home / Self Care | Payer: Managed Care, Other (non HMO) | Source: Ambulatory Visit | Attending: Internal Medicine | Admitting: Internal Medicine

## 2013-11-07 ENCOUNTER — Encounter (HOSPITAL_COMMUNITY): Payer: Self-pay

## 2013-11-07 NOTE — Pre-Procedure Instructions (Signed)
Telephone Call Completed.  Verbalized understanding of instructions.  Advised him to cover with his Regular insulin pm prior to procedure and take only 1/2 of usual 70/30 dose evening before, per Dr Patsey Berthold' instruction.

## 2013-11-11 ENCOUNTER — Encounter (HOSPITAL_COMMUNITY): Payer: Self-pay | Admitting: Emergency Medicine

## 2013-11-11 ENCOUNTER — Emergency Department (HOSPITAL_COMMUNITY)
Admission: EM | Admit: 2013-11-11 | Discharge: 2013-11-11 | Disposition: A | Discharge status: Home / Self Care | Payer: Managed Care, Other (non HMO) | Attending: Emergency Medicine | Admitting: Emergency Medicine

## 2013-11-11 ENCOUNTER — Emergency Department (HOSPITAL_COMMUNITY): Payer: Managed Care, Other (non HMO)

## 2013-11-11 DIAGNOSIS — K279 Peptic ulcer, site unspecified, unspecified as acute or chronic, without hemorrhage or perforation: Secondary | ICD-10-CM | POA: Diagnosis not present

## 2013-11-11 DIAGNOSIS — K3184 Gastroparesis: Secondary | ICD-10-CM

## 2013-11-11 DIAGNOSIS — Z8669 Personal history of other diseases of the nervous system and sense organs: Secondary | ICD-10-CM | POA: Insufficient documentation

## 2013-11-11 DIAGNOSIS — Z79899 Other long term (current) drug therapy: Secondary | ICD-10-CM | POA: Insufficient documentation

## 2013-11-11 DIAGNOSIS — Z794 Long term (current) use of insulin: Secondary | ICD-10-CM | POA: Diagnosis not present

## 2013-11-11 DIAGNOSIS — M1990 Unspecified osteoarthritis, unspecified site: Secondary | ICD-10-CM | POA: Insufficient documentation

## 2013-11-11 DIAGNOSIS — Z720 Tobacco use: Secondary | ICD-10-CM | POA: Insufficient documentation

## 2013-11-11 DIAGNOSIS — R011 Cardiac murmur, unspecified: Secondary | ICD-10-CM | POA: Insufficient documentation

## 2013-11-11 DIAGNOSIS — I10 Essential (primary) hypertension: Secondary | ICD-10-CM | POA: Diagnosis not present

## 2013-11-11 DIAGNOSIS — E1043 Type 1 diabetes mellitus with diabetic autonomic (poly)neuropathy: Secondary | ICD-10-CM | POA: Diagnosis not present

## 2013-11-11 DIAGNOSIS — K219 Gastro-esophageal reflux disease without esophagitis: Secondary | ICD-10-CM | POA: Insufficient documentation

## 2013-11-11 DIAGNOSIS — R109 Unspecified abdominal pain: Secondary | ICD-10-CM | POA: Diagnosis present

## 2013-11-11 LAB — CBC WITH DIFFERENTIAL/PLATELET
Basophils Absolute: 0.1 10*3/uL (ref 0.0–0.1)
Basophils Relative: 1 % (ref 0–1)
Eosinophils Absolute: 0 10*3/uL (ref 0.0–0.7)
Eosinophils Relative: 0 % (ref 0–5)
HCT: 40.9 % (ref 39.0–52.0)
Hemoglobin: 14.3 g/dL (ref 13.0–17.0)
Lymphocytes Relative: 12 % (ref 12–46)
Lymphs Abs: 1.1 10*3/uL (ref 0.7–4.0)
MCH: 30.6 pg (ref 26.0–34.0)
MCHC: 35 g/dL (ref 30.0–36.0)
MCV: 87.4 fL (ref 78.0–100.0)
Monocytes Absolute: 0.2 10*3/uL (ref 0.1–1.0)
Monocytes Relative: 2 % — ABNORMAL LOW (ref 3–12)
Neutro Abs: 7.9 10*3/uL — ABNORMAL HIGH (ref 1.7–7.7)
Neutrophils Relative %: 85 % — ABNORMAL HIGH (ref 43–77)
Platelets: 335 10*3/uL (ref 150–400)
RBC: 4.68 MIL/uL (ref 4.22–5.81)
RDW: 13.1 % (ref 11.5–15.5)
WBC: 9.2 10*3/uL (ref 4.0–10.5)

## 2013-11-11 LAB — URINALYSIS, ROUTINE W REFLEX MICROSCOPIC
Bilirubin Urine: NEGATIVE
Glucose, UA: >1000 mg/dL — AB
Hgb urine dipstick: NEGATIVE
Ketones, ur: >80 mg/dL — AB
Leukocytes, UA: NEGATIVE
Nitrite: NEGATIVE
Protein, ur: NEGATIVE mg/dL
Specific Gravity, Urine: <1.005 — ABNORMAL LOW (ref 1.005–1.030)
Urobilinogen, UA: 0.2 mg/dL (ref 0.0–1.0)
pH: 6.5 (ref 5.0–8.0)

## 2013-11-11 LAB — COMPREHENSIVE METABOLIC PANEL
ALBUMIN: 4.5 g/dL (ref 3.5–5.2)
ALT: 16 U/L (ref 0–53)
AST: 16 U/L (ref 0–37)
Alkaline Phosphatase: 94 U/L (ref 39–117)
BUN: 13 mg/dL (ref 6–23)
CO2: 26 mEq/L (ref 19–32)
CREATININE: 0.76 mg/dL (ref 0.50–1.35)
Calcium: 10.1 mg/dL (ref 8.4–10.5)
Chloride: 86 mEq/L — ABNORMAL LOW (ref 96–112)
GFR calc Af Amer: >90 mL/min (ref >90)
GFR calc non Af Amer: >90 mL/min (ref >90)
Glucose, Bld: 575 mg/dL (ref 70–99)
POTASSIUM: 4.9 meq/L (ref 3.7–5.3)
Sodium: 134 mEq/L — ABNORMAL LOW (ref 137–147)
Total Bilirubin: 1.3 mg/dL — ABNORMAL HIGH (ref 0.3–1.2)
Total Protein: 7.9 g/dL (ref 6.0–8.3)

## 2013-11-11 LAB — CBG MONITORING, ED
Glucose-Capillary: 487 mg/dL — ABNORMAL HIGH (ref 70–99)
Glucose-Capillary: 543 mg/dL — ABNORMAL HIGH (ref 70–99)
Glucose-Capillary: 430 mg/dL — ABNORMAL HIGH (ref 70–99)

## 2013-11-11 LAB — URINE MICROSCOPIC-ADD ON

## 2013-11-11 LAB — LIPASE, BLOOD: Lipase: 9 U/L — ABNORMAL LOW (ref 11–59)

## 2013-11-11 MED ORDER — HYDROMORPHONE HCL 1 MG/ML IJ SOLN
1.0000 mg | Freq: Once | INTRAMUSCULAR | Status: AC
  Administered 2013-11-11: 1 mg via INTRAVENOUS
  Filled 2013-11-11: qty 1

## 2013-11-11 MED ORDER — ONDANSETRON HCL 4 MG/2ML IJ SOLN
4.0000 mg | Freq: Once | INTRAMUSCULAR | Status: AC
  Administered 2013-11-11: 4 mg via INTRAVENOUS
  Filled 2013-11-11: qty 2

## 2013-11-11 MED ORDER — HYDROMORPHONE HCL 1 MG/ML IJ SOLN
0.5000 mg | Freq: Once | INTRAMUSCULAR | Status: AC
  Administered 2013-11-11: 0.5 mg via INTRAVENOUS
  Filled 2013-11-11: qty 1

## 2013-11-11 MED ORDER — METOCLOPRAMIDE HCL 5 MG/ML IJ SOLN
10.0000 mg | Freq: Once | INTRAMUSCULAR | Status: AC
  Administered 2013-11-11: 10 mg via INTRAVENOUS
  Filled 2013-11-11: qty 2

## 2013-11-11 MED ORDER — INSULIN ASPART 100 UNIT/ML ~~LOC~~ SOLN
5.0000 [IU] | Freq: Once | SUBCUTANEOUS | Status: AC
  Administered 2013-11-11: 5 [IU] via INTRAVENOUS
  Filled 2013-11-11: qty 1

## 2013-11-11 MED ORDER — SODIUM CHLORIDE 0.9 % IV SOLN
Freq: Once | INTRAVENOUS | Status: AC
  Administered 2013-11-11: 12:00:00 via INTRAVENOUS

## 2013-11-11 MED ORDER — ONDANSETRON HCL 4 MG/2ML IJ SOLN: 4.0000 mg | Freq: Once | INTRAMUSCULAR | Status: DC

## 2013-11-11 MED ORDER — IOHEXOL 300 MG/ML  SOLN
100.0000 mL | Freq: Once | INTRAMUSCULAR | Status: AC | PRN
  Administered 2013-11-11: 100 mL via INTRAVENOUS

## 2013-11-11 MED ORDER — SODIUM CHLORIDE 0.9 % IV BOLUS (SEPSIS)
1000.0000 mL | Freq: Once | INTRAVENOUS | Status: AC
  Administered 2013-11-11: 1000 mL via INTRAVENOUS

## 2013-11-11 MED ORDER — TIZANIDINE HCL 4 MG PO TABS: 4.0000 mg | ORAL_TABLET | Freq: Four times a day (QID) | ORAL | Status: DC | PRN

## 2013-11-11 MED ORDER — OXYCODONE-ACETAMINOPHEN 5-325 MG PO TABS: 1.0000 | ORAL_TABLET | ORAL | Status: DC | PRN

## 2013-11-11 MED ORDER — PROMETHAZINE HCL 25 MG PO TABS: 25.0000 mg | ORAL_TABLET | Freq: Four times a day (QID) | ORAL | Status: DC | PRN

## 2013-11-11 MED ORDER — FAMOTIDINE IN NACL 20-0.9 MG/50ML-% IV SOLN
20.0000 mg | Freq: Once | INTRAVENOUS | Status: AC
  Administered 2013-11-11: 20 mg via INTRAVENOUS
  Filled 2013-11-11: qty 50

## 2013-11-11 NOTE — ED Notes (Signed)
CRITICAL VALUE ALERT  Critical value received:  Glucose - 575  Date of notification:  11/11/2013  Time of notification:  1127  Critical value read back: yes  Nurse who received alert:  LJS  MD notified (1st page):  Dr Lacinda Axon  Time of first page:  1127  MD notified (2nd page):  Time of second page:  Responding MD:  Dr Lacinda Axon  Time MD responded:  1128

## 2013-11-11 NOTE — ED Notes (Signed)
edp back in to reeval

## 2013-11-11 NOTE — ED Notes (Signed)
Pt request more dilaudid. Pt continue to vomit. EDP aware

## 2013-11-11 NOTE — ED Notes (Signed)
Pt states right sided abdominal pain, vomiting, and CBG over 600 at home. Symptoms began yesterday.

## 2013-11-11 NOTE — Discharge Instructions (Signed)
Clear liquids. Medication for pain and nausea. See gastroenterologist in 2 days

## 2013-11-11 NOTE — ED Provider Notes (Signed)
CSN: [id number]     Arrival date & time 11/11/13  [id number] History   This chart was scribed for Nat Christen, MD, by Neta Ehlers, ED Scribe. This patient was seen in room APA19/APA19 and the patient's care was started at 10:39 AM.  First MD Initiated Contact with Patient 11/11/13 1016     Chief Complaint  Patient presents with  . Abdominal Pain  . Hyperglycemia     The history is provided by the patient. No language interpreter was used.   HPI Comments: Brent Daniels is a 28 y.o. male, with a h/o DM type I, diabetic gastroparesis, and PUD, who presents to the Emergency Department complaining of right-sided abdominal pain, onset yesterday, which has been associated with emesis and decreased appetite. He denies  diarrhea. He states his glucose levels were over 500 at home; in the ED his glucose levels are 487. He reports a h/o similar symptoms over the course of four months. The pt has been treated multiple times in the ED for abdominal pain and emesis including four weeks ago.  He reports he is scheduled to have a scope with Dr. [name] in two days on Thursday; the pt believes he has an ulcer.   Past Medical History  Diagnosis Date  . Diabetes mellitus     type 1  . Arthritis   . Back pain   . GERD (gastroesophageal reflux disease)   . PUD (peptic ulcer disease)   . Hypertension   . Heart murmur     valvular stenosis  . Neuropathy   . Diabetic gastroparesis associated with type 1 diabetes mellitus   . S/P arthroscopic knee surgery 07/04/2011  . Scoliosis 07/11/2012   Past Surgical History  Procedure Laterality Date  . Tympanostomy tube placement    . Knee arthroscopy  06/30/2011    Procedure: ARTHROSCOPY KNEE;  Surgeon: Carole Civil, MD;  Location: [facility];  Service: Orthopedics;  Laterality: Right;  diagnostic arthroscopy  . Esophagogastroduodenoscopy (egd) with propofol  06/17/2013    Dr. Oneida Alar: two gastric ulcers in fundus, mild antral gastritis, candida esophagitis  .  Esophageal biopsy  06/17/2013    Procedure: GASTRIC ULCER AND ANTRAL BIOPSIES; ESOPHAGEAL BRUSHING;  Surgeon: Danie Binder, MD;  Location: [facility];  Service: Endoscopy;;  . Esophagogastroduodenoscopy (egd) with propofol N/A 09/11/2013    Dr. [name]: abnormal hypopharynx, question massively enlarged tonsils, stomach full of food precluded exam, needs EGD for verification of ulcer healing at a later date   Family History  Problem Relation Age of Onset  . Cancer Father   . Alcohol abuse Father   . Pseudochol deficiency Neg Hx   . Malignant hyperthermia Neg Hx   . Hypotension Neg Hx   . Anesthesia problems Neg Hx   . Colon cancer Neg Hx    History  Substance Use Topics  . Smoking status: Current Some Day Smoker -- 0.50 packs/day for 10 years    Types: Cigarettes    Last Attempt to Quit: 10/07/2013  . Smokeless tobacco: Former Systems developer    Quit date: 02/17/2013     Comment: smokes 4 cigarettes a day   . Alcohol Use: No    Review of Systems  A complete 10 system review of systems was obtained, and all systems were negative except where indicated in the HPI and PE.    Allergies  Hydrocodone; Ibuprofen; Naproxen; Sulfa antibiotics; Tramadol; and Morphine and related  Home Medications   Prior to Admission medications   Medication  Sig Start Date End Date Taking? Authorizing Provider  metoCLOPramide (REGLAN) 10 MG tablet Take 1 tablet (10 mg total) by mouth every 6 (six) hours. 10/14/13  Yes Dorie Rank, MD  bismuth subsalicylate (PEPTO BISMOL) 262 MG/15ML suspension Take 30 mLs by mouth every 6 (six) hours as needed for indigestion or diarrhea or loose stools.    Historical Provider, MD  gabapentin (NEURONTIN) 800 MG tablet Take 800 mg by mouth 3 (three) times daily.    Historical Provider, MD  insulin NPH-regular Human (NOVOLIN 70/30) (70-30) 100 UNIT/ML injection Inject 15-25 Units into the skin 2 (two) times daily with a meal. 15 units in the morning and 25 at night.    Historical Provider, MD   insulin regular (NOVOLIN R,HUMULIN R) 100 units/mL injection Inject 2-10 Units into the skin 3 (three) times daily before meals. Sliding scale.    Historical Provider, MD  lisinopril (PRINIVIL,ZESTRIL) 20 MG tablet Take 20 mg by mouth daily.    Historical Provider, MD  oxyCODONE-acetaminophen (PERCOCET) 5-325 MG per tablet Take 1-2 tablets by mouth every 4 (four) hours as needed. 11/11/13   Nat Christen, MD  pantoprazole (PROTONIX) 40 MG tablet Take 1 tablet (40 mg total) by mouth 2 (two) times daily before a meal. 10/01/13   Kathie Dike, MD  pravastatin (PRAVACHOL) 20 MG tablet Take 20 mg by mouth daily.  09/10/13 09/10/14  Historical Provider, MD  promethazine (PHENERGAN) 25 MG tablet Take 1 tablet (25 mg total) by mouth every 6 (six) hours as needed. 11/11/13   Nat Christen, MD  tiZANidine (ZANAFLEX) 4 MG tablet Take 1 tablet by mouth every 8 (eight) hours as needed for muscle spasms.  07/30/13   Historical Provider, MD  tiZANidine (ZANAFLEX) 4 MG tablet Take 1 tablet (4 mg total) by mouth every 6 (six) hours as needed for muscle spasms. 11/11/13   Nat Christen, MD   Triage Vitals: BP 185/87  Pulse 79  Resp 16  Ht 6\' 2"  (1.88 m)  Wt 174 lb (78.926 kg)  BMI 22.33 kg/m2  SpO2 100%  Physical Exam  Nursing note and vitals reviewed. Constitutional: He is oriented to person, place, and time. He appears well-developed and well-nourished.  HENT:  Head: Normocephalic and atraumatic.  Eyes: Conjunctivae and EOM are normal. Pupils are equal, round, and reactive to light.  Neck: Normal range of motion. Neck supple.  Cardiovascular: Normal rate, regular rhythm and normal heart sounds.   Pulmonary/Chest: Effort normal and breath sounds normal.  Abdominal: Soft. Bowel sounds are normal. There is tenderness.  Epigastric and right lower abdominal pain.   Musculoskeletal: Normal range of motion.  Neurological: He is alert and oriented to person, place, and time.  Skin: Skin is warm and dry.  Psychiatric: He  has a normal mood and affect. His behavior is normal.    ED Course  Procedures (including critical care time)  DIAGNOSTIC STUDIES: Oxygen Saturation is 100% on room air, normal by my interpretation.    COORDINATION OF CARE:  10:42 AM- Discussed treatment plan with patient, and the patient agreed to the plan. The pt requests pain medication. The plan includes lab work.   Results for orders placed during the hospital encounter of 11/11/13  COMPREHENSIVE METABOLIC PANEL      Result Value Ref Range   Sodium 134 (*) 137 - 147 mEq/L   Potassium 4.9  3.7 - 5.3 mEq/L   Chloride 86 (*) 96 - 112 mEq/L   CO2 26  19 - 32 mEq/L  Glucose, Bld 575 (*) 70 - 99 mg/dL   BUN 13  6 - 23 mg/dL   Creatinine, Ser 0.76  0.50 - 1.35 mg/dL   Calcium 10.1  8.4 - 10.5 mg/dL   Total Protein 7.9  6.0 - 8.3 g/dL   Albumin 4.5  3.5 - 5.2 g/dL   AST 16  0 - 37 U/L   ALT 16  0 - 53 U/L   Alkaline Phosphatase 94  39 - 117 U/L   Total Bilirubin 1.3 (*) 0.3 - 1.2 mg/dL   GFR calc non Af Amer >90  >90 mL/min   GFR calc Af Amer >90  >90 mL/min  CBC WITH DIFFERENTIAL      Result Value Ref Range   WBC 9.2  4.0 - 10.5 K/uL   RBC 4.68  4.22 - 5.81 MIL/uL   Hemoglobin 14.3  13.0 - 17.0 g/dL   HCT 40.9  39.0 - 52.0 %   MCV 87.4  78.0 - 100.0 fL   MCH 30.6  26.0 - 34.0 pg   MCHC 35.0  30.0 - 36.0 g/dL   RDW 13.1  11.5 - 15.5 %   Platelets 335  150 - 400 K/uL   Neutrophils Relative % 85 (*) 43 - 77 %   Neutro Abs 7.9 (*) 1.7 - 7.7 K/uL   Lymphocytes Relative 12  12 - 46 %   Lymphs Abs 1.1  0.7 - 4.0 K/uL   Monocytes Relative 2 (*) 3 - 12 %   Monocytes Absolute 0.2  0.1 - 1.0 K/uL   Eosinophils Relative 0  0 - 5 %   Eosinophils Absolute 0.0  0.0 - 0.7 K/uL   Basophils Relative 1  0 - 1 %   Basophils Absolute 0.1  0.0 - 0.1 K/uL  LIPASE, BLOOD      Result Value Ref Range   Lipase 9 (*) 11 - 59 U/L  URINALYSIS, ROUTINE W REFLEX MICROSCOPIC      Result Value Ref Range   Color, Urine YELLOW  YELLOW    APPearance CLEAR  CLEAR   Specific Gravity, Urine <1.005 (*) 1.005 - 1.030   pH 6.5  5.0 - 8.0   Glucose, UA >1000 (*) NEGATIVE mg/dL   Hgb urine dipstick NEGATIVE  NEGATIVE   Bilirubin Urine NEGATIVE  NEGATIVE   Ketones, ur >80 (*) NEGATIVE mg/dL   Protein, ur NEGATIVE  NEGATIVE mg/dL   Urobilinogen, UA 0.2  0.0 - 1.0 mg/dL   Nitrite NEGATIVE  NEGATIVE   Leukocytes, UA NEGATIVE  NEGATIVE  URINE MICROSCOPIC-ADD ON      Result Value Ref Range   RBC / HPF 0-2  <3 RBC/hpf  CBG MONITORING, ED      Result Value Ref Range   Glucose-Capillary 487 (*) 70 - 99 mg/dL  CBG MONITORING, ED      Result Value Ref Range   Glucose-Capillary 543 (*) 70 - 99 mg/dL   Comment 1 Notify RN    CBG MONITORING, ED      Result Value Ref Range   Glucose-Capillary 430 (*) 70 - 99 mg/dL    Imaging Review Ct Abdomen Pelvis W Contrast  11/11/2013   CLINICAL DATA:  Right lower quadrant pain  EXAM: CT ABDOMEN AND PELVIS WITH CONTRAST  TECHNIQUE: Multidetector CT imaging of the abdomen and pelvis was performed using the standard protocol following bolus administration of intravenous contrast.  CONTRAST:  145mL OMNIPAQUE IOHEXOL 300 MG/ML  SOLN  COMPARISON:  CT  08/23/2013  FINDINGS: Lung bases are clear.  Heart size normal  Liver gallbladder and bile ducts are normal. Atrophic pancreas without edema or mass. Spleen is normal. Kidneys show no obstruction or mass. No renal calculi  Negative for bowel obstruction. No bowel thickening or mass. No adenopathy or free fluid.  Lumbar spine is negative.  IMPRESSION: Negative   Electronically Signed   By: Franchot Gallo M.D.   On: 11/11/2013 14:10     EKG Interpretation None      MDM   Final diagnoses:  Gastroparesis  PUD (peptic ulcer disease)  Diabetic gastroparesis associated with type 1 diabetes mellitus    CT scan reveals no acute findings. Patient is not in DKA. Rx IV fluids, pain management. No acute abdomen. Patient will see a gastroenterologist in 2 days for  EGD.  Discharge medications Percocet, Phenergan 25 mg, Zanaflex 4 mg  I personally performed the services described in this documentation, which was scribed in my presence. The recorded information has been reviewed and is accurate.    Nat Christen, MD 11/12/13 [phone]

## 2013-11-13 ENCOUNTER — Emergency Department (HOSPITAL_COMMUNITY): Payer: Managed Care, Other (non HMO)

## 2013-11-13 ENCOUNTER — Ambulatory Visit (HOSPITAL_COMMUNITY)
Admission: RE | Admit: 2013-11-13 | Discharge: 2013-11-13 | Disposition: A | Discharge status: Still In Hospital | Payer: Managed Care, Other (non HMO) | Source: Ambulatory Visit | Attending: Internal Medicine | Admitting: Internal Medicine

## 2013-11-13 ENCOUNTER — Encounter (HOSPITAL_COMMUNITY)
Admission: RE | Disposition: A | Discharge status: Still In Hospital | Payer: Self-pay | Source: Ambulatory Visit | Attending: Internal Medicine

## 2013-11-13 ENCOUNTER — Encounter (HOSPITAL_COMMUNITY): Payer: Self-pay | Admitting: Anesthesiology

## 2013-11-13 ENCOUNTER — Inpatient Hospital Stay (HOSPITAL_COMMUNITY)
Admission: EM | Admit: 2013-11-13 | Discharge: 2013-11-17 | DRG: 638 | Disposition: A | Discharge status: Home / Self Care | Payer: Managed Care, Other (non HMO) | Attending: Internal Medicine | Admitting: Internal Medicine

## 2013-11-13 ENCOUNTER — Encounter (HOSPITAL_COMMUNITY): Payer: Self-pay | Admitting: Emergency Medicine

## 2013-11-13 ENCOUNTER — Telehealth: Payer: Self-pay | Admitting: Internal Medicine

## 2013-11-13 ENCOUNTER — Encounter (HOSPITAL_COMMUNITY): Payer: Self-pay | Admitting: *Deleted

## 2013-11-13 DIAGNOSIS — M1990 Unspecified osteoarthritis, unspecified site: Secondary | ICD-10-CM | POA: Diagnosis present

## 2013-11-13 DIAGNOSIS — Z794 Long term (current) use of insulin: Secondary | ICD-10-CM | POA: Diagnosis not present

## 2013-11-13 DIAGNOSIS — F17210 Nicotine dependence, cigarettes, uncomplicated: Secondary | ICD-10-CM | POA: Diagnosis present

## 2013-11-13 DIAGNOSIS — K920 Hematemesis: Secondary | ICD-10-CM | POA: Diagnosis present

## 2013-11-13 DIAGNOSIS — IMO0002 Reserved for concepts with insufficient information to code with codable children: Secondary | ICD-10-CM

## 2013-11-13 DIAGNOSIS — R1110 Vomiting, unspecified: Secondary | ICD-10-CM

## 2013-11-13 DIAGNOSIS — R109 Unspecified abdominal pain: Secondary | ICD-10-CM

## 2013-11-13 DIAGNOSIS — Z809 Family history of malignant neoplasm, unspecified: Secondary | ICD-10-CM | POA: Diagnosis not present

## 2013-11-13 DIAGNOSIS — E860 Dehydration: Secondary | ICD-10-CM | POA: Diagnosis present

## 2013-11-13 DIAGNOSIS — E1110 Type 2 diabetes mellitus with ketoacidosis without coma: Secondary | ICD-10-CM | POA: Diagnosis present

## 2013-11-13 DIAGNOSIS — M419 Scoliosis, unspecified: Secondary | ICD-10-CM | POA: Diagnosis present

## 2013-11-13 DIAGNOSIS — R1011 Right upper quadrant pain: Secondary | ICD-10-CM | POA: Diagnosis present

## 2013-11-13 DIAGNOSIS — K219 Gastro-esophageal reflux disease without esophagitis: Secondary | ICD-10-CM | POA: Diagnosis present

## 2013-11-13 DIAGNOSIS — R1013 Epigastric pain: Secondary | ICD-10-CM

## 2013-11-13 DIAGNOSIS — E1010 Type 1 diabetes mellitus with ketoacidosis without coma: Secondary | ICD-10-CM | POA: Diagnosis not present

## 2013-11-13 DIAGNOSIS — G8929 Other chronic pain: Secondary | ICD-10-CM

## 2013-11-13 DIAGNOSIS — K279 Peptic ulcer, site unspecified, unspecified as acute or chronic, without hemorrhage or perforation: Secondary | ICD-10-CM

## 2013-11-13 DIAGNOSIS — K3184 Gastroparesis: Secondary | ICD-10-CM

## 2013-11-13 DIAGNOSIS — E1165 Type 2 diabetes mellitus with hyperglycemia: Secondary | ICD-10-CM

## 2013-11-13 DIAGNOSIS — I10 Essential (primary) hypertension: Secondary | ICD-10-CM | POA: Diagnosis present

## 2013-11-13 DIAGNOSIS — E1043 Type 1 diabetes mellitus with diabetic autonomic (poly)neuropathy: Secondary | ICD-10-CM

## 2013-11-13 LAB — BASIC METABOLIC PANEL
ANION GAP: 20 — AB (ref 5–15)
ANION GAP: 17 — AB (ref 5–15)
ANION GAP: 14 (ref 5–15)
BUN: 13 mg/dL (ref 6–23)
BUN: 12 mg/dL (ref 6–23)
BUN: 11 mg/dL (ref 6–23)
CALCIUM: 9 mg/dL (ref 8.4–10.5)
CHLORIDE: 96 meq/L (ref 96–112)
CHLORIDE: 96 meq/L (ref 96–112)
CO2: 20 mEq/L (ref 19–32)
CO2: 21 mEq/L (ref 19–32)
CO2: 25 meq/L (ref 19–32)
CREATININE: 0.67 mg/dL (ref 0.50–1.35)
Calcium: 8.3 mg/dL — ABNORMAL LOW (ref 8.4–10.5)
Calcium: 8.7 mg/dL (ref 8.4–10.5)
Chloride: 94 mEq/L — ABNORMAL LOW (ref 96–112)
Creatinine, Ser: 0.74 mg/dL (ref 0.50–1.35)
Creatinine, Ser: 0.67 mg/dL (ref 0.50–1.35)
GFR calc Af Amer: >90 mL/min (ref >90)
GFR calc Af Amer: >90 mL/min (ref >90)
GFR calc non Af Amer: >90 mL/min (ref >90)
GFR calc non Af Amer: >90 mL/min (ref >90)
GFR calc non Af Amer: >90 mL/min (ref >90)
Glucose, Bld: 233 mg/dL — ABNORMAL HIGH (ref 70–99)
Glucose, Bld: 178 mg/dL — ABNORMAL HIGH (ref 70–99)
Glucose, Bld: 138 mg/dL — ABNORMAL HIGH (ref 70–99)
Potassium: 3.9 mEq/L (ref 3.7–5.3)
Potassium: 3.6 mEq/L — ABNORMAL LOW (ref 3.7–5.3)
Potassium: 3.4 mEq/L — ABNORMAL LOW (ref 3.7–5.3)
SODIUM: 134 meq/L — AB (ref 137–147)
SODIUM: 135 meq/L — AB (ref 137–147)
Sodium: 134 mEq/L — ABNORMAL LOW (ref 137–147)

## 2013-11-13 LAB — COMPREHENSIVE METABOLIC PANEL
ALK PHOS: 82 U/L (ref 39–117)
ALT: 10 U/L (ref 0–53)
ANION GAP: 28 — AB (ref 5–15)
AST: 10 U/L (ref 0–37)
Albumin: 4 g/dL (ref 3.5–5.2)
BILIRUBIN TOTAL: 1.4 mg/dL — AB (ref 0.3–1.2)
BUN: 13 mg/dL (ref 6–23)
CHLORIDE: 85 meq/L — AB (ref 96–112)
CO2: 18 mEq/L — ABNORMAL LOW (ref 19–32)
Calcium: 9.3 mg/dL (ref 8.4–10.5)
Creatinine, Ser: 0.74 mg/dL (ref 0.50–1.35)
GFR calc Af Amer: >90 mL/min (ref >90)
GLUCOSE: 450 mg/dL — AB (ref 70–99)
Potassium: 4 mEq/L (ref 3.7–5.3)
Sodium: 131 mEq/L — ABNORMAL LOW (ref 137–147)
TOTAL PROTEIN: 7.3 g/dL (ref 6.0–8.3)

## 2013-11-13 LAB — GLUCOSE, CAPILLARY
GLUCOSE-CAPILLARY: 233 mg/dL — AB (ref 70–99)
GLUCOSE-CAPILLARY: 208 mg/dL — AB (ref 70–99)
Glucose-Capillary: 280 mg/dL — ABNORMAL HIGH (ref 70–99)
Glucose-Capillary: 182 mg/dL — ABNORMAL HIGH (ref 70–99)
Glucose-Capillary: 154 mg/dL — ABNORMAL HIGH (ref 70–99)
Glucose-Capillary: 144 mg/dL — ABNORMAL HIGH (ref 70–99)
Glucose-Capillary: 130 mg/dL — ABNORMAL HIGH (ref 70–99)

## 2013-11-13 LAB — CBG MONITORING, ED
GLUCOSE-CAPILLARY: 419 mg/dL — AB (ref 70–99)
Glucose-Capillary: 374 mg/dL — ABNORMAL HIGH (ref 70–99)

## 2013-11-13 LAB — CBC
HCT: 39.6 % (ref 39.0–52.0)
Hemoglobin: 13.5 g/dL (ref 13.0–17.0)
MCH: 30.1 pg (ref 26.0–34.0)
MCHC: 34.1 g/dL (ref 30.0–36.0)
MCV: 88.4 fL (ref 78.0–100.0)
Platelets: 320 10*3/uL (ref 150–400)
RBC: 4.48 MIL/uL (ref 4.22–5.81)
RDW: 13 % (ref 11.5–15.5)
WBC: 15.4 10*3/uL — ABNORMAL HIGH (ref 4.0–10.5)

## 2013-11-13 LAB — URINALYSIS, ROUTINE W REFLEX MICROSCOPIC
Bilirubin Urine: NEGATIVE
GLUCOSE, UA: 500 mg/dL — AB
HGB URINE DIPSTICK: NEGATIVE
Ketones, ur: >80 mg/dL — AB
Leukocytes, UA: NEGATIVE
Nitrite: NEGATIVE
PH: 5 (ref 5.0–8.0)
Protein, ur: NEGATIVE mg/dL
SPECIFIC GRAVITY, URINE: 1.015 (ref 1.005–1.030)
Urobilinogen, UA: 0.2 mg/dL (ref 0.0–1.0)

## 2013-11-13 LAB — LACTIC ACID, PLASMA: Lactic Acid, Venous: 1.7 mmol/L (ref 0.5–2.2)

## 2013-11-13 LAB — LIPASE, BLOOD: Lipase: 8 U/L — ABNORMAL LOW (ref 11–59)

## 2013-11-13 LAB — MRSA PCR SCREENING: MRSA by PCR: NEGATIVE

## 2013-11-13 SURGERY — CANCELLED PROCEDURE

## 2013-11-13 SURGERY — ESOPHAGOGASTRODUODENOSCOPY (EGD) WITH PROPOFOL
Anesthesia: Monitor Anesthesia Care

## 2013-11-13 MED ORDER — DEXTROSE-NACL 5-0.45 % IV SOLN
INTRAVENOUS | Status: DC
  Administered 2013-11-13: 18:00:00 via INTRAVENOUS

## 2013-11-13 MED ORDER — ONDANSETRON HCL 4 MG/2ML IJ SOLN
4.0000 mg | Freq: Once | INTRAMUSCULAR | Status: AC
  Administered 2013-11-13: 4 mg via INTRAVENOUS
  Filled 2013-11-13: qty 2

## 2013-11-13 MED ORDER — SODIUM CHLORIDE 0.9 % IV SOLN: 250.0000 mL | INTRAVENOUS | Status: DC | PRN

## 2013-11-13 MED ORDER — GABAPENTIN 100 MG PO CAPS
ORAL_CAPSULE | ORAL | Status: AC
  Filled 2013-11-13: qty 2

## 2013-11-13 MED ORDER — ONDANSETRON HCL 4 MG/2ML IJ SOLN
4.0000 mg | Freq: Three times a day (TID) | INTRAMUSCULAR | Status: AC | PRN
  Administered 2013-11-13: 4 mg via INTRAVENOUS
  Filled 2013-11-13: qty 2

## 2013-11-13 MED ORDER — POTASSIUM CHLORIDE 10 MEQ/100ML IV SOLN
10.0000 meq | Freq: Once | INTRAVENOUS | Status: AC
  Administered 2013-11-13: 10 meq via INTRAVENOUS
  Filled 2013-11-13: qty 100

## 2013-11-13 MED ORDER — SODIUM CHLORIDE 0.9 % IV SOLN
INTRAVENOUS | Status: DC
  Administered 2013-11-13: 3.1 [IU]/h via INTRAVENOUS
  Filled 2013-11-13: qty 2.5

## 2013-11-13 MED ORDER — GABAPENTIN 300 MG PO CAPS
ORAL_CAPSULE | ORAL | Status: AC
  Filled 2013-11-13: qty 2

## 2013-11-13 MED ORDER — LISINOPRIL 10 MG PO TABS
20.0000 mg | ORAL_TABLET | Freq: Every day | ORAL | Status: DC
  Administered 2013-11-13 – 2013-11-17 (×5): 20 mg via ORAL
  Filled 2013-11-13 (×5): qty 2

## 2013-11-13 MED ORDER — HYDROMORPHONE HCL 1 MG/ML IJ SOLN
1.0000 mg | INTRAMUSCULAR | Status: AC | PRN
  Administered 2013-11-13 – 2013-11-14 (×4): 1 mg via INTRAVENOUS
  Filled 2013-11-13 (×5): qty 1

## 2013-11-13 MED ORDER — SODIUM CHLORIDE 0.9 % IV SOLN
INTRAVENOUS | Status: DC
  Administered 2013-11-13: 15:00:00 via INTRAVENOUS

## 2013-11-13 MED ORDER — ASPIRIN 81 MG PO CHEW: 324.0000 mg | CHEWABLE_TABLET | ORAL | Status: AC

## 2013-11-13 MED ORDER — SODIUM CHLORIDE 0.9 % IV BOLUS (SEPSIS)
1000.0000 mL | Freq: Once | INTRAVENOUS | Status: AC
  Administered 2013-11-13: 1000 mL via INTRAVENOUS

## 2013-11-13 MED ORDER — SODIUM CHLORIDE 0.9 % IV SOLN: INTRAVENOUS | Status: AC

## 2013-11-13 MED ORDER — DEXTROSE-NACL 5-0.45 % IV SOLN
INTRAVENOUS | Status: DC
  Administered 2013-11-13: 20:00:00 via INTRAVENOUS

## 2013-11-13 MED ORDER — HYDROMORPHONE HCL 1 MG/ML IJ SOLN: 1.0000 mg | INTRAMUSCULAR | Status: DC | PRN

## 2013-11-13 MED ORDER — GABAPENTIN 400 MG PO CAPS
800.0000 mg | ORAL_CAPSULE | Freq: Three times a day (TID) | ORAL | Status: DC
  Administered 2013-11-13 – 2013-11-17 (×12): 800 mg via ORAL
  Filled 2013-11-13 (×12): qty 2

## 2013-11-13 MED ORDER — HYDROMORPHONE HCL 1 MG/ML IJ SOLN
1.0000 mg | Freq: Once | INTRAMUSCULAR | Status: AC
  Administered 2013-11-13: 1 mg via INTRAVENOUS
  Filled 2013-11-13: qty 1

## 2013-11-13 MED ORDER — PANTOPRAZOLE SODIUM 40 MG IV SOLR
40.0000 mg | Freq: Every day | INTRAVENOUS | Status: DC
  Administered 2013-11-13: 40 mg via INTRAVENOUS
  Filled 2013-11-13: qty 40

## 2013-11-13 MED ORDER — INSULIN ASPART 100 UNIT/ML ~~LOC~~ SOLN
10.0000 [IU] | Freq: Once | SUBCUTANEOUS | Status: AC
  Administered 2013-11-13: 10 [IU] via INTRAVENOUS
  Filled 2013-11-13: qty 1

## 2013-11-13 MED ORDER — SODIUM CHLORIDE 0.9 % IV SOLN: INTRAVENOUS | Status: DC

## 2013-11-13 MED ORDER — POTASSIUM CHLORIDE 10 MEQ/100ML IV SOLN
10.0000 meq | INTRAVENOUS | Status: AC
  Administered 2013-11-14 (×2): 10 meq via INTRAVENOUS
  Filled 2013-11-13 (×2): qty 100

## 2013-11-13 MED ORDER — ASPIRIN 300 MG RE SUPP: 300.0000 mg | RECTAL | Status: AC

## 2013-11-13 MED ORDER — PANTOPRAZOLE SODIUM 40 MG IV SOLR
40.0000 mg | Freq: Once | INTRAVENOUS | Status: AC
  Administered 2013-11-13: 40 mg via INTRAVENOUS
  Filled 2013-11-13: qty 40

## 2013-11-13 MED ORDER — METOCLOPRAMIDE HCL 5 MG/ML IJ SOLN
10.0000 mg | Freq: Once | INTRAMUSCULAR | Status: AC
  Administered 2013-11-13: 10 mg via INTRAVENOUS
  Filled 2013-11-13: qty 2

## 2013-11-13 MED ORDER — GABAPENTIN 800 MG PO TABS
800.0000 mg | ORAL_TABLET | Freq: Three times a day (TID) | ORAL | Status: DC
  Filled 2013-11-13 (×2): qty 1

## 2013-11-13 SURGICAL SUPPLY — 22 items
BLOCK BITE 60FR ADLT L/F BLUE (MISCELLANEOUS) IMPLANT
DEVICE CLIP HEMOSTAT 235CM (CLIP) IMPLANT
ELECT REM PT RETURN 9FT ADLT (ELECTROSURGICAL)
ELECTRODE REM PT RTRN 9FT ADLT (ELECTROSURGICAL) IMPLANT
FLOOR PAD 36X40 (MISCELLANEOUS)
FORCEPS BIOP RAD 4 LRG CAP 4 (CUTTING FORCEPS) IMPLANT
FORMALIN 10 PREFIL 20ML (MISCELLANEOUS) IMPLANT
KIT CLEAN ENDO COMPLIANCE (KITS) IMPLANT
MANIFOLD NEPTUNE II (INSTRUMENTS) IMPLANT
NDL SCLEROTHERAPY 25GX240 (NEEDLE) IMPLANT
NEEDLE SCLEROTHERAPY 25GX240 (NEEDLE) IMPLANT
PAD FLOOR 36X40 (MISCELLANEOUS) IMPLANT
PROBE APC STR FIRE (PROBE) IMPLANT
PROBE INJECTION GOLD (MISCELLANEOUS)
PROBE INJECTION GOLD 7FR (MISCELLANEOUS) IMPLANT
SNARE ROTATE MED OVAL 20MM (MISCELLANEOUS) IMPLANT
SNARE SHORT THROW 13M SML OVAL (MISCELLANEOUS) ×1 IMPLANT
SYR 50ML LL SCALE MARK (SYRINGE) IMPLANT
SYR INFLATION 60ML (SYRINGE) ×1 IMPLANT
TUBING INSUFFLATOR CO2MPACT (TUBING) ×1 IMPLANT
TUBING IRRIGATION ENDOGATOR (MISCELLANEOUS) IMPLANT
WATER STERILE IRR 1000ML POUR (IV SOLUTION) IMPLANT

## 2013-11-13 NOTE — ED Notes (Signed)
POC CBG 419.

## 2013-11-13 NOTE — ED Provider Notes (Signed)
CSN: [id number]     Arrival date & time 11/13/13  1247 History  This chart was scribed for Brent Sorrow, MD by Zola Button, ED Scribe. This patient was seen in room APA04/APA04 and the patient's care was started at 1:34 PM.     Chief Complaint  Patient presents with  . Abdominal Pain     Patient is a 28 y.o. male presenting with abdominal pain. The history is provided by the patient. No language interpreter was used.  Abdominal Pain Pain location:  Epigastric Pain quality: sharp   Pain radiates to:  Chest and back Duration:  1 week Timing:  Constant Progression:  Unchanged Chronicity:  Chronic Relieved by:  Nothing Worsened by:  Nothing tried Ineffective treatments:  None tried Associated symptoms: chest pain, nausea and vomiting   Associated symptoms: no chills, no diarrhea, no dysuria, no fever, no hematuria and no shortness of breath    HPI Comments: Brent Daniels is a 28 y.o. male who presents to the Emergency Department complaining of chronic, constant, sharp, epigastric and right-sided abdominal pain radiating to his chest and back that he has had for a long time, but worsened 1 week ago. Patient has been vomiting at least 5 times a day, sometimes with blood. He notes pools of blood with his vomit yesterday. He was seen 2 days ago and had a CT scan done that turned out normal. He notes associated nausea and vomiting that began more than 3 days ago. He was scheduled for an upper endoscopy procedure this morning with Dr. Gala Daniels, but could not have it done today due to his conditions. He has tried Pepto but without relief to symptoms. Patient has been on Reglan. He states his abdominal pain is similar to prior episodes. Patient denies diarrhea.  PCP: Dr. Neta Mends of [facility] with [facility]  Past Medical History  Diagnosis Date  . Diabetes mellitus     type 1  . Arthritis   . Back pain   . GERD (gastroesophageal reflux disease)   . PUD (peptic ulcer disease)   .  Hypertension   . Heart murmur     valvular stenosis  . Neuropathy   . Diabetic gastroparesis associated with type 1 diabetes mellitus   . S/P arthroscopic knee surgery 07/04/2011  . Scoliosis 07/11/2012   Past Surgical History  Procedure Laterality Date  . Tympanostomy tube placement    . Knee arthroscopy  06/30/2011    Procedure: ARTHROSCOPY KNEE;  Surgeon: Carole Civil, MD;  Location: [facility];  Service: Orthopedics;  Laterality: Right;  diagnostic arthroscopy  . Esophagogastroduodenoscopy (egd) with propofol  06/17/2013    Dr. Oneida Alar: two gastric ulcers in fundus, mild antral gastritis, candida esophagitis  . Esophageal biopsy  06/17/2013    Procedure: GASTRIC ULCER AND ANTRAL BIOPSIES; ESOPHAGEAL BRUSHING;  Surgeon: Danie Binder, MD;  Location: [facility];  Service: Endoscopy;;  . Esophagogastroduodenoscopy (egd) with propofol N/A 09/11/2013    Dr. Gala Daniels: abnormal hypopharynx, question massively enlarged tonsils, stomach full of food precluded exam, needs EGD for verification of ulcer healing at a later date   Family History  Problem Relation Age of Onset  . Cancer Father   . Alcohol abuse Father   . Pseudochol deficiency Neg Hx   . Malignant hyperthermia Neg Hx   . Hypotension Neg Hx   . Anesthesia problems Neg Hx   . Colon cancer Neg Hx    History  Substance Use Topics  . Smoking status: Current Some  Day Smoker -- 0.50 packs/day for 10 years    Types: Cigarettes    Last Attempt to Quit: 10/07/2013  . Smokeless tobacco: Former Systems developer    Quit date: 02/17/2013     Comment: smokes 4 cigarettes a day   . Alcohol Use: No    Review of Systems  Constitutional: Negative for fever and chills.  HENT: Negative for congestion.   Eyes: Negative for visual disturbance.  Respiratory: Negative for choking and shortness of breath.   Cardiovascular: Positive for chest pain. Negative for leg swelling.  Gastrointestinal: Positive for nausea, vomiting and abdominal pain. Negative for diarrhea.   Genitourinary: Negative for dysuria and hematuria.  Musculoskeletal: Positive for back pain.  Skin: Negative for rash.  Neurological: Negative for dizziness, light-headedness and headaches.  Hematological: Does not bruise/bleed easily.  Psychiatric/Behavioral: Negative for confusion.      Allergies  Hydrocodone; Ibuprofen; Naproxen; Sulfa antibiotics; Tramadol; and Morphine and related  Home Medications   Prior to Admission medications   Medication Sig Start Date End Date Taking? Authorizing Provider  bismuth subsalicylate (PEPTO BISMOL) 262 MG/15ML suspension Take 30 mLs by mouth every 6 (six) hours as needed for indigestion or diarrhea or loose stools.    Historical Provider, MD  gabapentin (NEURONTIN) 800 MG tablet Take 800 mg by mouth 3 (three) times daily.    Historical Provider, MD  insulin NPH-regular Human (NOVOLIN 70/30) (70-30) 100 UNIT/ML injection Inject 15-25 Units into the skin 2 (two) times daily with a meal. 15 units in the morning and 25 at night.    Historical Provider, MD  insulin regular (NOVOLIN R,HUMULIN R) 100 units/mL injection Inject 2-10 Units into the skin 3 (three) times daily before meals. Sliding scale.    Historical Provider, MD  lisinopril (PRINIVIL,ZESTRIL) 20 MG tablet Take 20 mg by mouth daily.    Historical Provider, MD  metoCLOPramide (REGLAN) 10 MG tablet Take 1 tablet (10 mg total) by mouth every 6 (six) hours. 10/14/13   Brent Rank, MD  oxyCODONE-acetaminophen (PERCOCET) 5-325 MG per tablet Take 1-2 tablets by mouth every 4 (four) hours as needed. 11/11/13   Nat Christen, MD  pantoprazole (PROTONIX) 40 MG tablet Take 1 tablet (40 mg total) by mouth 2 (two) times daily before a meal. 10/01/13   Kathie Dike, MD  pravastatin (PRAVACHOL) 20 MG tablet Take 20 mg by mouth daily.  09/10/13 09/10/14  Historical Provider, MD  promethazine (PHENERGAN) 25 MG tablet Take 1 tablet (25 mg total) by mouth every 6 (six) hours as needed. 11/11/13   Nat Christen, MD   tiZANidine (ZANAFLEX) 4 MG tablet Take 1 tablet by mouth every 6 (six) hours as needed for muscle spasms.  07/30/13   Historical Provider, MD   BP 151/81  Pulse 119  Temp(Src) 98.2 F (36.8 C) (Oral)  Resp 20  Ht 6\' 2"  (1.88 m)  Wt 170 lb (77.111 kg)  BMI 21.82 kg/m2  SpO2 100% Physical Exam  Nursing note and vitals reviewed. Constitutional: He is oriented to person, place, and time. He appears well-developed and well-nourished. No distress.  HENT:  Head: Normocephalic and atraumatic.  Mouth/Throat: No oropharyngeal exudate.  Dry mucous membranes  Eyes: Conjunctivae and EOM are normal. Pupils are equal, round, and reactive to light.  Neck: Neck supple.  Cardiovascular: Regular rhythm and normal heart sounds.  Tachycardia present.   No murmur heard. Pulmonary/Chest: Effort normal and breath sounds normal.  Abdominal: Soft. There is tenderness. There is no guarding.  Bowel sounds decreased  Musculoskeletal: He exhibits no edema.  Neurological: He is alert and oriented to person, place, and time. No cranial nerve deficit. He exhibits normal muscle tone. Coordination normal.  Skin: Skin is warm and dry. No rash noted.  Psychiatric: He has a normal mood and affect. His behavior is normal.    ED Course  Procedures  DIAGNOSTIC STUDIES: Oxygen Saturation is 100% on RA, nml by my interpretation.    COORDINATION OF CARE: 1:44 PM-Discussed treatment plan which includes labs with pt at bedside and pt agreed to plan.   Labs Review Labs Reviewed  CBC - Abnormal; Notable for the following:    WBC 15.4 (*)    All other components within normal limits  COMPREHENSIVE METABOLIC PANEL - Abnormal; Notable for the following:    Sodium 131 (*)    Chloride 85 (*)    CO2 18 (*)    Glucose, Bld 450 (*)    Total Bilirubin 1.4 (*)    Anion gap 28 (*)    All other components within normal limits  LIPASE, BLOOD - Abnormal; Notable for the following:    Lipase 8 (*)    All other components  within normal limits  URINALYSIS, ROUTINE W REFLEX MICROSCOPIC - Abnormal; Notable for the following:    Glucose, UA 500 (*)    Ketones, ur >80 (*)    All other components within normal limits  CBG MONITORING, ED - Abnormal; Notable for the following:    Glucose-Capillary 419 (*)    All other components within normal limits  BLOOD GAS, VENOUS  LACTIC ACID, PLASMA  CBG MONITORING, ED  CBG MONITORING, ED   Results for orders placed during the hospital encounter of 11/13/13  CBC      Result Value Ref Range   WBC 15.4 (*) 4.0 - 10.5 K/uL   RBC 4.48  4.22 - 5.81 MIL/uL   Hemoglobin 13.5  13.0 - 17.0 g/dL   HCT 39.6  39.0 - 52.0 %   MCV 88.4  78.0 - 100.0 fL   MCH 30.1  26.0 - 34.0 pg   MCHC 34.1  30.0 - 36.0 g/dL   RDW 13.0  11.5 - 15.5 %   Platelets 320  150 - 400 K/uL  COMPREHENSIVE METABOLIC PANEL      Result Value Ref Range   Sodium 131 (*) 137 - 147 mEq/L   Potassium 4.0  3.7 - 5.3 mEq/L   Chloride 85 (*) 96 - 112 mEq/L   CO2 18 (*) 19 - 32 mEq/L   Glucose, Bld 450 (*) 70 - 99 mg/dL   BUN 13  6 - 23 mg/dL   Creatinine, Ser 0.74  0.50 - 1.35 mg/dL   Calcium 9.3  8.4 - 10.5 mg/dL   Total Protein 7.3  6.0 - 8.3 g/dL   Albumin 4.0  3.5 - 5.2 g/dL   AST 10  0 - 37 U/L   ALT 10  0 - 53 U/L   Alkaline Phosphatase 82  39 - 117 U/L   Total Bilirubin 1.4 (*) 0.3 - 1.2 mg/dL   GFR calc non Af Amer >90  >90 mL/min   GFR calc Af Amer >90  >90 mL/min   Anion gap 28 (*) 5 - 15  LIPASE, BLOOD      Result Value Ref Range   Lipase 8 (*) 11 - 59 U/L  URINALYSIS, ROUTINE W REFLEX MICROSCOPIC      Result Value Ref Range   Color, Urine YELLOW  YELLOW   APPearance CLEAR  CLEAR   Specific Gravity, Urine 1.015  1.005 - 1.030   pH 5.0  5.0 - 8.0   Glucose, UA 500 (*) NEGATIVE mg/dL   Hgb urine dipstick NEGATIVE  NEGATIVE   Bilirubin Urine NEGATIVE  NEGATIVE   Ketones, ur >80 (*) NEGATIVE mg/dL   Protein, ur NEGATIVE  NEGATIVE mg/dL   Urobilinogen, UA 0.2  0.0 - 1.0 mg/dL   Nitrite  NEGATIVE  NEGATIVE   Leukocytes, UA NEGATIVE  NEGATIVE  CBG MONITORING, ED      Result Value Ref Range   Glucose-Capillary 419 (*) 70 - 99 mg/dL     Imaging Review Dg Abd Acute W/chest  11/13/2013   CLINICAL DATA:  Right-sided abdominal and periumbilical pain with vomiting for the past 8 days; diabetes; possible gastric [place] is; upper endoscopy originally scheduled for today was canceled due to hyperglycemia  EXAM: ACUTE ABDOMEN SERIES (ABDOMEN 2 VIEW & CHEST 1 VIEW)  COMPARISON:  Abdominal series of October 14, 2013  FINDINGS: The lungs are adequately inflated and clear. The heart and pulmonary vascularity are normal. There is no pleural effusion. Within the abdomen the colonic stool pattern is unremarkable. There is no small bowel obstruction. There is no evidence of perforation. There is air and fluid within the stomach. There is a phlebolith in the pelvis. The lumbar spine and bony pelvis are unremarkable where visualized.  IMPRESSION: There is no acute cardiopulmonary abnormality nor acute intra-abdominal abnormality demonstrated on today's study.   Electronically Signed   By: David  Martinique   On: 11/13/2013 15:02     EKG Interpretation None      CRITICAL CARE Performed by: Brent Daniels Total critical care time: 30 Critical care time was exclusive of separately billable procedures and treating other patients. Critical care was necessary to treat or prevent imminent or life-threatening deterioration. Critical care was time spent personally by me on the following activities: development of treatment plan with patient and/or surrogate as well as nursing, discussions with consultants, evaluation of patient's response to treatment, examination of patient, obtaining history from patient or surrogate, ordering and performing treatments and interventions, ordering and review of laboratory studies, ordering and review of radiographic studies, pulse oximetry and re-evaluation of patient's  condition.    MDM   Final diagnoses:  Gastroparesis  Diabetic ketoacidosis without coma associated with type 1 diabetes mellitus    Patient with history of insulin-dependent diabetes. Patient with nausea and vomiting for about 7 days felt to be related to his gastroparesis. Patient had upper endoscopy ordered today. Unable to get that done due to him persistently vomiting so patient's procedure was canceled and sent over your for further evaluation.  Patient appeared dehydrated. Patient noted that he had vomiting poles of blood yesterday but none today. Hemoglobin and hematocrit seems relatively stable. However is consistent with a metabolic acidosis most likely DKA. Blood sugars were in the 400s. Potassium was not that elevated at 4.0 so patient started with IV potassium along with a glucose stabilizer. Patient did receive 10 units of regular insulin as a bolus. Patient is also receiving fluid hydration start he had 1 L second liter ordered. Will contact the hospitalist for admission. Dr Brent Daniels can follow from GI.  Patient with complaint of abdominal pain but no acute surgical abdomen. Patient did have CT scan of his abdomen on October 6 which was negative. Suspect abdominal pain is related to the acidosis.    I personally performed the services described  in this documentation, which was scribed in my presence. The recorded information has been reviewed and is accurate.     Brent Sorrow, MD 11/13/13 1537

## 2013-11-13 NOTE — Telephone Encounter (Signed)
He needs to address his glucose of over 400 with provider who is managing his diabetes. He has had elevated sugars on several occasions recently and likely needs change in therapy for better control.  Dr. [name] is requesting that he get his sugars under control prior to attempting another EGD.   Please schedule a follow up OV here once he has seen his diabetic doctor.

## 2013-11-13 NOTE — ED Notes (Addendum)
Pt reports generalized pain,n/v,abdominal pain. Pt sent from post-op. Pt supposed to have EGD completed today but due elevated sugar and pain level. EGD not completed. nad noted. CBG 410 in post op.

## 2013-11-13 NOTE — Telephone Encounter (Signed)
Pt is on a SSI  So he would have to check his sugar to know how much insulin to give himself.  Please advise

## 2013-11-13 NOTE — H&P (Signed)
History and Physical  Brent Daniels [id number] DOB: [birth year] DOA: 11/13/2013  Referring physician: Dr Rogene Houston, ED physician PCP: [name], MD   Chief Complaint: High blood sugar  HPI: Brent Daniels is a 28 y.o. male  With a history of type 1 diabetes on insulin, diabetic gastroparesis, GERD, peptic ulcer disease, hypertension, neuropathy. The patient has a history of one week of hematemesis there has been worsening. Due to his hematemesis and severe, sharp, nonradiating abdominal pain, which he rates 10 out of 10 and mostly located in the epigastric area, the patient has had limited dietary intake. He comes today for his limited intake, the patient has not been taking his full dose of insulin his blood sugars have been increasing.  The patient is followed by Dr. [name] of gastroenterology and was scheduled for a procedure this morning to evaluate his peptic ulcer disease in the OR. The patient's blood sugar was checked in the perioperative area, it was found to be quite elevated and patient was sent to the emergency department for evaluation.   Review of Systems:   Pt complains of abdominal pain, hematemesis that last occurred a day ago.  Pt denies any fevers, chills, chest pain, shortness of breath, confusion, blurred vision, edema, headache, palpitations.  Review of systems are otherwise negative  Past Medical History  Diagnosis Date  . Diabetes mellitus     type 1  . Arthritis   . Back pain   . GERD (gastroesophageal reflux disease)   . PUD (peptic ulcer disease)   . Hypertension   . Heart murmur     valvular stenosis  . Neuropathy   . Diabetic gastroparesis associated with type 1 diabetes mellitus   . S/P arthroscopic knee surgery 07/04/2011  . Scoliosis 07/11/2012   Past Surgical History  Procedure Laterality Date  . Tympanostomy tube placement    . Knee arthroscopy  06/30/2011    Procedure: ARTHROSCOPY KNEE;  Surgeon: Carole Civil, MD;  Location: [facility];  Service: Orthopedics;  Laterality: Right;  diagnostic arthroscopy  . Esophagogastroduodenoscopy (egd) with propofol  06/17/2013    Dr. Oneida Alar: two gastric ulcers in fundus, mild antral gastritis, candida esophagitis  . Esophageal biopsy  06/17/2013    Procedure: GASTRIC ULCER AND ANTRAL BIOPSIES; ESOPHAGEAL BRUSHING;  Surgeon: Danie Binder, MD;  Location: [facility];  Service: Endoscopy;;  . Esophagogastroduodenoscopy (egd) with propofol N/A 09/11/2013    Dr. [name]: abnormal hypopharynx, question massively enlarged tonsils, stomach full of food precluded exam, needs EGD for verification of ulcer healing at a later date   Social History:  reports that he has been smoking Cigarettes.  He has a 5 pack-year smoking history. He quit smokeless tobacco use about 8 months ago. He reports that he does not drink alcohol or use illicit drugs. Patient lives at home & is able to participate in activities of daily living without assistance  Allergies  Allergen Reactions  . Hydrocodone Hives  . Ibuprofen Other (See Comments)    Stomach ulcers  . Naproxen Other (See Comments)    Stomach ulcers  . Sulfa Antibiotics Other (See Comments)    Stomach ulcers  . Tramadol   . Morphine And Related Rash    Family History  Problem Relation Age of Onset  . Cancer Father   . Alcohol abuse Father   . Pseudochol deficiency Neg Hx   . Malignant hyperthermia Neg Hx   . Hypotension Neg Hx   . Anesthesia problems Neg Hx   .  Colon cancer Neg Hx      Prior to Admission medications   Medication Sig Start Date End Date Taking? Authorizing Provider  bismuth subsalicylate (PEPTO BISMOL) 262 MG/15ML suspension Take 30 mLs by mouth every 6 (six) hours as needed for indigestion or diarrhea or loose stools.   Yes Historical Provider, MD  gabapentin (NEURONTIN) 800 MG tablet Take 800 mg by mouth 3 (three) times daily.   Yes Historical Provider, MD  insulin NPH-regular Human (NOVOLIN 70/30) (70-30) 100 UNIT/ML injection  Inject 15-25 Units into the skin 2 (two) times daily with a meal. 15 units in the morning and 25 at night.   Yes Historical Provider, MD  insulin regular (NOVOLIN R,HUMULIN R) 100 units/mL injection Inject 2-10 Units into the skin 3 (three) times daily before meals. Sliding scale.   Yes Historical Provider, MD  lisinopril (PRINIVIL,ZESTRIL) 20 MG tablet Take 20 mg by mouth daily.   Yes Historical Provider, MD  metoCLOPramide (REGLAN) 10 MG tablet Take 1 tablet (10 mg total) by mouth every 6 (six) hours. 10/14/13  Yes Dorie Rank, MD  oxyCODONE-acetaminophen (PERCOCET) 5-325 MG per tablet Take 1-2 tablets by mouth every 4 (four) hours as needed. 11/11/13  Yes Nat Christen, MD  pantoprazole (PROTONIX) 40 MG tablet Take 1 tablet (40 mg total) by mouth 2 (two) times daily before a meal. 10/01/13  Yes Kathie Dike, MD  pravastatin (PRAVACHOL) 20 MG tablet Take 20 mg by mouth daily.  09/10/13 09/10/14 Yes Historical Provider, MD  promethazine (PHENERGAN) 25 MG tablet Take 1 tablet (25 mg total) by mouth every 6 (six) hours as needed. 11/11/13  Yes Nat Christen, MD  tiZANidine (ZANAFLEX) 4 MG tablet Take 1 tablet by mouth every 6 (six) hours as needed for muscle spasms.  07/30/13  Yes Historical Provider, MD    Physical Exam: BP 144/90  Pulse 119  Temp(Src) 98.8 F (37.1 C) (Oral)  Resp 12  Ht 6\' 2"  (1.88 m)  Wt 77.111 kg (170 lb)  BMI 21.82 kg/m2  SpO2 100%  General: Young Caucasian male. Awake and alert and oriented x3. No acute cardiopulmonary distress.  Eyes: Pupils equal, round, reactive to light. Extraocular muscles are intact. Sclerae anicteric and noninjected.  ENT: Moist mucosal membranes. No mucosal lesions.   Neck: Neck supple without lymphadenopathy. No carotid bruits. No masses palpated.  Cardiovascular: Regular rate with normal S1-S2 sounds. No murmurs, rubs, gallops auscultated. No JVD.  Respiratory: Good respiratory effort with no wheezes, rales, rhonchi. Lungs clear to auscultation  bilaterally.  Abdomen: Soft,  diffusely tender, although more tender in the epigastric area. His abdomen is nondistended.  Hyperactive bowel sounds. No masses or hepatosplenomegaly.  Mild guarding but no rebound tenderness.  Skin: Dry, warm to touch. 2+ dorsalis pedis and radial pulses. Musculoskeletal: No calf or leg pain. All major joints not erythematous nontender.  Psychiatric: Intact judgment and insight.  Neurologic: No focal neurological deficits. Cranial nerves II through XII are grossly intact.           Labs on Admission:  Basic Metabolic Panel:  Recent Labs Lab 11/11/13 1051 11/13/13 1254  NA 134* 131*  K 4.9 4.0  CL 86* 85*  CO2 26 18*  GLUCOSE 575* 450*  BUN 13 13  CREATININE 0.76 0.74  CALCIUM 10.1 9.3   Liver Function Tests:  Recent Labs Lab 11/11/13 1051 11/13/13 1254  AST 16 10  ALT 16 10  ALKPHOS 94 82  BILITOT 1.3* 1.4*  PROT 7.9 7.3  ALBUMIN  4.5 4.0    Recent Labs Lab 11/11/13 1051 11/13/13 1254  LIPASE 9* 8*   No results found for this basename: AMMONIA,  in the last 168 hours CBC:  Recent Labs Lab 11/11/13 1051 11/13/13 1254  WBC 9.2 15.4*  NEUTROABS 7.9*  --   HGB 14.3 13.5  HCT 40.9 39.6  MCV 87.4 88.4  PLT 335 320   Cardiac Enzymes: No results found for this basename: CKTOTAL, CKMB, CKMBINDEX, TROPONINI,  in the last 168 hours  BNP (last 3 results) No results found for this basename: PROBNP,  in the last 8760 hours CBG:  Recent Labs Lab 11/11/13 1533 11/13/13 1250 11/13/13 1538 11/13/13 1707 11/13/13 1809  GLUCAP 430* 419* 374* 280* 233*    Radiological Exams on Admission: Dg Abd Acute W/chest  11/13/2013   CLINICAL DATA:  Right-sided abdominal and periumbilical pain with vomiting for the past 8 days; diabetes; possible gastric Paris is; upper endoscopy originally scheduled for today was canceled due to hyperglycemia  EXAM: ACUTE ABDOMEN SERIES (ABDOMEN 2 VIEW & CHEST 1 VIEW)  COMPARISON:  Abdominal series of  October 14, 2013  FINDINGS: The lungs are adequately inflated and clear. The heart and pulmonary vascularity are normal. There is no pleural effusion. Within the abdomen the colonic stool pattern is unremarkable. There is no small bowel obstruction. There is no evidence of perforation. There is air and fluid within the stomach. There is a phlebolith in the pelvis. The lumbar spine and bony pelvis are unremarkable where visualized.  IMPRESSION: There is no acute cardiopulmonary abnormality nor acute intra-abdominal abnormality demonstrated on today's study.   Electronically Signed   By: David  Martinique   On: 11/13/2013 15:02    Assessment/Plan Present on Admission:  . DKA (diabetic ketoacidoses) . Hematemesis . DKA, type 1 . Abdominal pain, epigastric  #1 diabetic ketoacidosis Likely secondary to poor intake and poor control with insulin. Admit to step down unit on insulin drip. The last metabolic panel was obtained in the emergency Department 6 hours ago. We'll get a stat BMP now and then repeat basic metabolic panels every 2 hours with blood sugar checks every hour. Should the patient's gap show dramatic improvement, can to decrease basic metabolic checks every 4 hours. As patient is still tachycardic, in only one liter bolus was given in the emergency department, will give an additional bolus of IV fluids.  #2 abdominal pain Likely related to peptic ulcer disease. Will treat the patient with pain medicine attempts to help keep patient comfortable.  #3 hematemesis Possibly related to peptic ulcer disease. Keep patient n.p.o. except for ice chips and sips with meds. Protonix IV will will be given to the patient. Consult GI in the morning. Patient hemodynamically stable at this point.  #4 hypertension Continue lisinopril  #5 chronic neuropathy Gabapentin  DVT prophylaxis: SCDs  Consultants: Gastroenterology  Code Status: Full code  Family Communication: None   Disposition Plan: Home  following treatment  Time spent: 70 minutes  Loma Boston, DO [facility] Pager [phone]  **Disclaimer: This note may have been dictated with voice recognition software. Similar sounding words can inadvertently be transcribed and this note may contain transcription errors which may not have been corrected upon publication of note.**

## 2013-11-13 NOTE — Progress Notes (Signed)
Glucose 410.  Reported to Dr Patsey Berthold and Dr [name].  Procedure cancelled and patient taken to ED for assessment, per Dr [name].

## 2013-11-13 NOTE — ED Notes (Signed)
Normal saline fluids bolusing at time of pt arrival.

## 2013-11-13 NOTE — Telephone Encounter (Signed)
PER RMR PROCEDURE FROM 11/13/13 HAD TO BE CANCELLED DUE TO HIGH BLOOD SUGAR LEVEL.  PATIENT NEEDS TO COME BACK FOR OFFICE VISIT IN A FEW WEEKS ONLY IF HE IS DOING SOMETHING TO TAKE CARE OF HIS BLOOD SUGAR.  PLEASE CALL PATIENT TO FIND OUT IF HE IS DOING THIS AND THEN WE CAN GET HIM AN APPOINTMENT TO START PROCESS OVER AGAIN.

## 2013-11-14 DIAGNOSIS — R1013 Epigastric pain: Secondary | ICD-10-CM

## 2013-11-14 DIAGNOSIS — R110 Nausea: Secondary | ICD-10-CM

## 2013-11-14 DIAGNOSIS — K279 Peptic ulcer, site unspecified, unspecified as acute or chronic, without hemorrhage or perforation: Secondary | ICD-10-CM

## 2013-11-14 DIAGNOSIS — K920 Hematemesis: Secondary | ICD-10-CM

## 2013-11-14 DIAGNOSIS — E1043 Type 1 diabetes mellitus with diabetic autonomic (poly)neuropathy: Secondary | ICD-10-CM

## 2013-11-14 DIAGNOSIS — E1165 Type 2 diabetes mellitus with hyperglycemia: Secondary | ICD-10-CM

## 2013-11-14 DIAGNOSIS — K3184 Gastroparesis: Secondary | ICD-10-CM

## 2013-11-14 LAB — GLUCOSE, CAPILLARY
GLUCOSE-CAPILLARY: 410 mg/dL — AB (ref 70–99)
GLUCOSE-CAPILLARY: 126 mg/dL — AB (ref 70–99)
GLUCOSE-CAPILLARY: 179 mg/dL — AB (ref 70–99)
GLUCOSE-CAPILLARY: 160 mg/dL — AB (ref 70–99)
GLUCOSE-CAPILLARY: 185 mg/dL — AB (ref 70–99)
Glucose-Capillary: 126 mg/dL — ABNORMAL HIGH (ref 70–99)
Glucose-Capillary: 130 mg/dL — ABNORMAL HIGH (ref 70–99)
Glucose-Capillary: 135 mg/dL — ABNORMAL HIGH (ref 70–99)
Glucose-Capillary: 142 mg/dL — ABNORMAL HIGH (ref 70–99)
Glucose-Capillary: 146 mg/dL — ABNORMAL HIGH (ref 70–99)
Glucose-Capillary: 158 mg/dL — ABNORMAL HIGH (ref 70–99)
Glucose-Capillary: 168 mg/dL — ABNORMAL HIGH (ref 70–99)
Glucose-Capillary: 175 mg/dL — ABNORMAL HIGH (ref 70–99)
Glucose-Capillary: 177 mg/dL — ABNORMAL HIGH (ref 70–99)
Glucose-Capillary: 179 mg/dL — ABNORMAL HIGH (ref 70–99)
Glucose-Capillary: 185 mg/dL — ABNORMAL HIGH (ref 70–99)
Glucose-Capillary: 190 mg/dL — ABNORMAL HIGH (ref 70–99)
Glucose-Capillary: 218 mg/dL — ABNORMAL HIGH (ref 70–99)

## 2013-11-14 LAB — BASIC METABOLIC PANEL
ANION GAP: 14 (ref 5–15)
ANION GAP: 16 — AB (ref 5–15)
ANION GAP: 11 (ref 5–15)
Anion gap: 15 (ref 5–15)
BUN: 10 mg/dL (ref 6–23)
BUN: 10 mg/dL (ref 6–23)
BUN: 9 mg/dL (ref 6–23)
BUN: 8 mg/dL (ref 6–23)
CALCIUM: 8.7 mg/dL (ref 8.4–10.5)
CALCIUM: 8.4 mg/dL (ref 8.4–10.5)
CHLORIDE: 96 meq/L (ref 96–112)
CHLORIDE: 96 meq/L (ref 96–112)
CHLORIDE: 96 meq/L (ref 96–112)
CHLORIDE: 97 meq/L (ref 96–112)
CO2: 24 mEq/L (ref 19–32)
CO2: 26 mEq/L (ref 19–32)
CO2: 21 meq/L (ref 19–32)
CO2: 24 meq/L (ref 19–32)
Calcium: 8.7 mg/dL (ref 8.4–10.5)
Calcium: 8.4 mg/dL (ref 8.4–10.5)
Creatinine, Ser: 0.67 mg/dL (ref 0.50–1.35)
Creatinine, Ser: 0.63 mg/dL (ref 0.50–1.35)
Creatinine, Ser: 0.59 mg/dL (ref 0.50–1.35)
Creatinine, Ser: 0.56 mg/dL (ref 0.50–1.35)
GFR calc Af Amer: >90 mL/min (ref >90)
GFR calc Af Amer: >90 mL/min (ref >90)
GFR calc Af Amer: >90 mL/min (ref >90)
GFR calc Af Amer: >90 mL/min (ref >90)
GFR calc non Af Amer: >90 mL/min (ref >90)
GFR calc non Af Amer: >90 mL/min (ref >90)
GFR calc non Af Amer: >90 mL/min (ref >90)
GFR calc non Af Amer: >90 mL/min (ref >90)
Glucose, Bld: 119 mg/dL — ABNORMAL HIGH (ref 70–99)
Glucose, Bld: 139 mg/dL — ABNORMAL HIGH (ref 70–99)
Glucose, Bld: 183 mg/dL — ABNORMAL HIGH (ref 70–99)
Glucose, Bld: 150 mg/dL — ABNORMAL HIGH (ref 70–99)
POTASSIUM: 3.5 meq/L — AB (ref 3.7–5.3)
Potassium: 3.3 mEq/L — ABNORMAL LOW (ref 3.7–5.3)
Potassium: 3.8 mEq/L (ref 3.7–5.3)
Potassium: 3.3 mEq/L — ABNORMAL LOW (ref 3.7–5.3)
SODIUM: 134 meq/L — AB (ref 137–147)
SODIUM: 133 meq/L — AB (ref 137–147)
SODIUM: 134 meq/L — AB (ref 137–147)
Sodium: 135 mEq/L — ABNORMAL LOW (ref 137–147)

## 2013-11-14 LAB — CBC
HCT: 36.4 % — ABNORMAL LOW (ref 39.0–52.0)
Hemoglobin: 12.7 g/dL — ABNORMAL LOW (ref 13.0–17.0)
MCH: 30.5 pg (ref 26.0–34.0)
MCHC: 34.9 g/dL (ref 30.0–36.0)
MCV: 87.3 fL (ref 78.0–100.0)
Platelets: 289 10*3/uL (ref 150–400)
RBC: 4.17 MIL/uL — ABNORMAL LOW (ref 4.22–5.81)
RDW: 13.2 % (ref 11.5–15.5)
WBC: 14.7 10*3/uL — AB (ref 4.0–10.5)

## 2013-11-14 MED ORDER — INSULIN ASPART PROT & ASPART (70-30 MIX) 100 UNIT/ML ~~LOC~~ SUSP
20.0000 [IU] | Freq: Two times a day (BID) | SUBCUTANEOUS | Status: DC
  Administered 2013-11-15 – 2013-11-17 (×5): 20 [IU] via SUBCUTANEOUS
  Filled 2013-11-14: qty 10

## 2013-11-14 MED ORDER — HYDROMORPHONE HCL 1 MG/ML IJ SOLN
1.0000 mg | INTRAMUSCULAR | Status: DC | PRN
  Administered 2013-11-14 – 2013-11-15 (×6): 1 mg via INTRAVENOUS
  Filled 2013-11-14 (×5): qty 1

## 2013-11-14 MED ORDER — INSULIN GLARGINE 100 UNIT/ML ~~LOC~~ SOLN
15.0000 [IU] | Freq: Once | SUBCUTANEOUS | Status: AC
  Administered 2013-11-14: 15 [IU] via SUBCUTANEOUS
  Filled 2013-11-14: qty 0.15

## 2013-11-14 MED ORDER — PROMETHAZINE HCL 25 MG/ML IJ SOLN: 12.5000 mg | Freq: Four times a day (QID) | INTRAMUSCULAR | Status: DC | PRN

## 2013-11-14 MED ORDER — PROMETHAZINE HCL 25 MG/ML IJ SOLN
12.5000 mg | Freq: Four times a day (QID) | INTRAMUSCULAR | Status: DC | PRN
  Administered 2013-11-14 (×2): 12.5 mg via INTRAVENOUS
  Filled 2013-11-14 (×2): qty 1

## 2013-11-14 MED ORDER — METOCLOPRAMIDE HCL 5 MG/ML IJ SOLN
10.0000 mg | Freq: Four times a day (QID) | INTRAMUSCULAR | Status: DC
  Administered 2013-11-14 – 2013-11-17 (×13): 10 mg via INTRAVENOUS
  Filled 2013-11-14 (×13): qty 2

## 2013-11-14 MED ORDER — POTASSIUM CHLORIDE CRYS ER 20 MEQ PO TBCR
40.0000 meq | EXTENDED_RELEASE_TABLET | Freq: Once | ORAL | Status: AC
  Administered 2013-11-14: 40 meq via ORAL
  Filled 2013-11-14: qty 2

## 2013-11-14 MED ORDER — PANTOPRAZOLE SODIUM 40 MG IV SOLR
40.0000 mg | Freq: Two times a day (BID) | INTRAVENOUS | Status: DC
  Administered 2013-11-14 – 2013-11-17 (×6): 40 mg via INTRAVENOUS
  Filled 2013-11-14 (×6): qty 40

## 2013-11-14 MED ORDER — SODIUM CHLORIDE 0.9 % IV SOLN
INTRAVENOUS | Status: DC
  Administered 2013-11-14 – 2013-11-16 (×5): via INTRAVENOUS

## 2013-11-14 MED ORDER — INSULIN ASPART PROT & ASPART (70-30 MIX) 100 UNIT/ML ~~LOC~~ SUSP
20.0000 [IU] | Freq: Two times a day (BID) | SUBCUTANEOUS | Status: DC
  Filled 2013-11-14: qty 10

## 2013-11-14 MED ORDER — INSULIN ASPART 100 UNIT/ML ~~LOC~~ SOLN
0.0000 [IU] | Freq: Three times a day (TID) | SUBCUTANEOUS | Status: DC
  Administered 2013-11-14: 2 [IU] via SUBCUTANEOUS
  Administered 2013-11-15: 5 [IU] via SUBCUTANEOUS
  Administered 2013-11-15: 3 [IU] via SUBCUTANEOUS
  Administered 2013-11-15: 2 [IU] via SUBCUTANEOUS
  Administered 2013-11-16: 5 [IU] via SUBCUTANEOUS
  Administered 2013-11-16: 8 [IU] via SUBCUTANEOUS
  Administered 2013-11-17: 3 [IU] via SUBCUTANEOUS
  Administered 2013-11-17: 8 [IU] via SUBCUTANEOUS

## 2013-11-14 NOTE — Progress Notes (Signed)
Called by staff RN about patient being transitioned off glucostabilizer.  Recommend that patient have one dose of Lantus 15 units one to two hours before transitioning off drip.  Then give Novolog per SENSITIVE correction scale as drip is stopped.  Continue Novolog correction scale every 4 hours if NPO or TID & HS if eating.  Then, in the morning ( on 10/10)  if patient is able to eat, start the 70/30 insulin twice a day as ordered.  Will continue to follow while in hospital.  Harvel Ricks RN BSN CDE

## 2013-11-14 NOTE — Progress Notes (Signed)
Subjective:  28 y/o male with history of gastric ulcers, candida esophagitis in 06/2013, who was recently seen in the office to reschedule EGD to verify ulcer healing. He had attempted EGD in 09/2013 but his stomach was full of food and could not be done. Suspected gastroparesis. He has frequent intractable N/V, epigastric/ruq pain. Multiple ER visits for same and for DKA. Reports hematemesis of fresh blood couple of days ago. BM yesterday, no melena, brbpr.  BM once per week, normal for him. Denies ASA/NSAIDs. No etoh or drugs.   He presented for EGD yesterday and due to glucose over 400 he was sent to ER. Admitted with DKA.   Objective: Vital signs in last 24 hours: Temp:  [97.8 F (36.6 C)-98.8 F (37.1 C)] 97.8 F (36.6 C) (10/09 0730) Pulse Rate:  [108-121] 121 (10/08 1600) Resp:  [12-36] 16 (10/09 1100) BP: (111-167)/(72-97) 167/92 mmHg (10/09 1100) SpO2:  [98 %-100 %] 98 % (10/09 0200) Weight:  [170 lb (77.111 kg)] 170 lb (77.111 kg) (10/08 1248) Last BM Date: 11/12/13 General:   Alert,  Well-developed, well-nourished, pleasant and cooperative in NAD Head:  Normocephalic and atraumatic. Eyes:  Sclera clear, no icterus.  Chest: CTA bilaterally without rales, rhonchi, crackles.    Heart:  Regular rate and rhythm; no murmurs, clicks, rubs,  or gallops. Abdomen:  Soft, diffuse upper abdominal tenderness moderate, and nondistended. No masses, hepatosplenomegaly or hernias noted. Normal bowel sounds, without guarding, and without rebound.   Extremities:  Without clubbing, deformity or edema. Neurologic:  Alert and  oriented x4;  grossly normal neurologically. Skin:  Intact without significant lesions or rashes. Psych:  Alert and cooperative. Normal mood and affect.  Intake/Output from previous day: 10/08 0701 - 10/09 0700 In: 1250 [I.V.:1250] Out: 950 [Urine:950] Intake/Output this shift: Total I/O In: 625 [I.V.:625] Out: -   Lab Results: CBC  Recent Labs  11/13/13 1254  11/14/13 0449  WBC 15.4* 14.7*  HGB 13.5 12.7*  HCT 39.6 36.4*  MCV 88.4 87.3  PLT 320 289   BMET  Recent Labs  11/14/13 0228 11/14/13 0449 11/14/13 0648  NA 134* 133* 134*  K 3.5* 3.8 3.3*  CL 96 96 97  CO2 24 21 26   GLUCOSE 139* 183* 150*  BUN 10 9 8   CREATININE 0.63 0.59 0.56  CALCIUM 8.4 8.7 8.4   LFTs  Recent Labs  11/13/13 1254  BILITOT 1.4*  ALKPHOS 82  AST 10  ALT 10  PROT 7.3  ALBUMIN 4.0    Recent Labs  11/13/13 1254  LIPASE 8*   PT/INR No results found for this basename: LABPROT, INR,  in the last 72 hours    Imaging Studies: Ct Abdomen Pelvis W Contrast  11/11/2013   CLINICAL DATA:  Right lower quadrant pain  EXAM: CT ABDOMEN AND PELVIS WITH CONTRAST  TECHNIQUE: Multidetector CT imaging of the abdomen and pelvis was performed using the standard protocol following bolus administration of intravenous contrast.  CONTRAST:  145mL OMNIPAQUE IOHEXOL 300 MG/ML  SOLN  COMPARISON:  CT 08/23/2013  FINDINGS: Lung bases are clear.  Heart size normal  Liver gallbladder and bile ducts are normal. Atrophic pancreas without edema or mass. Spleen is normal. Kidneys show no obstruction or mass. No renal calculi  Negative for bowel obstruction. No bowel thickening or mass. No adenopathy or free fluid.  Lumbar spine is negative.  IMPRESSION: Negative   Electronically Signed   By: Franchot Gallo M.D.   On: 11/11/2013 14:10   Dg  Abd Acute W/chest  11/13/2013   CLINICAL DATA:  Right-sided abdominal and periumbilical pain with vomiting for the past 8 days; diabetes; possible gastric Paris is; upper endoscopy originally scheduled for today was canceled due to hyperglycemia  EXAM: ACUTE ABDOMEN SERIES (ABDOMEN 2 VIEW & CHEST 1 VIEW)  COMPARISON:  Abdominal series of October 14, 2013  FINDINGS: The lungs are adequately inflated and clear. The heart and pulmonary vascularity are normal. There is no pleural effusion. Within the abdomen the colonic stool pattern is unremarkable.  There is no small bowel obstruction. There is no evidence of perforation. There is air and fluid within the stomach. There is a phlebolith in the pelvis. The lumbar spine and bony pelvis are unremarkable where visualized.  IMPRESSION: There is no acute cardiopulmonary abnormality nor acute intra-abdominal abnormality demonstrated on today's study.   Electronically Signed   By: David  Martinique   On: 11/13/2013 15:02  [2 weeks]   Assessment: 28 y/o male with uncontrolled DM, suspected gastroparesis, PUD who was admitted for DKAs. EGD cancelled yesterday, to be done for h/o gastric ulcers and ongoing abd pain, vomiting. Patient complains of frequent vomiting and daily epigastric/ruq pain. Suspect ongoing PUD. Cannot exclude biliary etiology. Reports hematemesis, without melena. None seen today by nursing staff. Hgb down some likely due to hemodilutional.   Plan: 1. Patient will need EGD once diabetes is better controlled. Procedure will be done with deep sedation and he has h/o failed EGD due to retained gastric contents. Given these factors, ideally should plan for EGD Monday if he is still inpatient. Otherwise, could be offered as outpatient.  2. Would continue clear liquids this weekend. NPO after midnight Sunday. 3. Reglan QID. 4. Pantoprazole IV BID.  5. Watch for signs of overt GI bleeding or significant drop in H/H.  6. Discussed need for patient to address poorly controlled DM with PCP or endocrinologist. Last HgbA1C over 11.   We would like to thank you for the opportunity to participate in the care of Brent Daniels.  There will be no GI coverage after 5pm today through Monday 11/17/13 at 7am.   LOS: 1 day   Neil Crouch  11/14/2013, 11:47 AM  Attending note:  Patient seen and examined the ICU this afternoon. Agree with assessment and plan as outlined above.

## 2013-11-14 NOTE — Progress Notes (Signed)
[facility] PROGRESS NOTE  Brent Daniels [id number] DOB: Mar 10, 1985 DOA: 11/13/2013 PCP: [name], MD  Assessment/Plan: 1. Diabetic ketoacidosis. Improved with IV fluids and insulin infusion. Anion gap is closing and he was transitioned to subcutaneous insulin. We'll check hemoglobin A1c. Advance diet as tolerated 2. Nausea and vomiting. Likely related to peptic ulcer versus gastroparesis. Continue Reglan, Protonix. Advance diet as tolerated 3. Hematemesis. Question Mallory-Weiss tear. Plans are for endoscopy on 10/12 in the operating room. Continue supportive treatment for now. Monitor CBC for now 4. Gastroparesis. Continue Reglan, clear liquids for now 5. Diabetic neuropathy. Continue gabapentin 6. Hypertension. Continue lisinopril  Code Status: Full code Family Communication: discussed with patient Disposition Plan: discharge home once improved   Consultants:  Gastroenterology  Procedures:    Antibiotics:    HPI/Subjective: Has continued epigastric pain and feels nauseous  Objective: Filed Vitals:   11/14/13 1700  BP: 148/89  Pulse:   Temp:   Resp: 17    Intake/Output Summary (Last 24 hours) at 11/14/13 1904 Last data filed at 11/14/13 1600  Gross per 24 hour  Intake   2500 ml  Output   1950 ml  Net    550 ml   Filed Weights   11/13/13 1248  Weight: 77.111 kg (170 lb)    Exam:   General:  NAD  Cardiovascular: S1, S2 RRR  Respiratory: CTA B  Abdomen: soft, tender in epigastrium, bs+  Musculoskeletal: no edema b/l   Data Reviewed: Basic Metabolic Panel:  Recent Labs Lab 11/13/13 2226 11/14/13 0031 11/14/13 0228 11/14/13 0449 11/14/13 0648  NA 135* 135* 134* 133* 134*  K 3.4* 3.3* 3.5* 3.8 3.3*  CL 96 96 96 96 97  CO2 25 24 24 21 26   GLUCOSE 138* 119* 139* 183* 150*  BUN 11 10 10 9 8   CREATININE 0.67 0.67 0.63 0.59 0.56  CALCIUM 8.7 8.7 8.4 8.7 8.4   Liver Function Tests:  Recent Labs Lab 11/11/13 1051  11/13/13 1254  AST 16 10  ALT 16 10  ALKPHOS 94 82  BILITOT 1.3* 1.4*  PROT 7.9 7.3  ALBUMIN 4.5 4.0    Recent Labs Lab 11/11/13 1051 11/13/13 1254  LIPASE 9* 8*   No results found for this basename: AMMONIA,  in the last 168 hours CBC:  Recent Labs Lab 11/11/13 1051 11/13/13 1254 11/14/13 0449  WBC 9.2 15.4* 14.7*  NEUTROABS 7.9*  --   --   HGB 14.3 13.5 12.7*  HCT 40.9 39.6 36.4*  MCV 87.4 88.4 87.3  PLT 335 320 289   Cardiac Enzymes: No results found for this basename: CKTOTAL, CKMB, CKMBINDEX, TROPONINI,  in the last 168 hours BNP (last 3 results) No results found for this basename: PROBNP,  in the last 8760 hours CBG:  Recent Labs Lab 11/14/13 1258 11/14/13 1418 11/14/13 1636 11/14/13 1735 11/14/13 1828  GLUCAP 179* 185* 177* 158* 142*    Recent Results (from the past 240 hour(s))  MRSA PCR SCREENING     Status: None   Collection Time    11/13/13  3:20 PM      Result Value Ref Range Status   MRSA by PCR NEGATIVE  NEGATIVE Final   Comment:            The GeneXpert MRSA Assay (FDA     approved for NASAL specimens     only), is one component of a     comprehensive MRSA colonization     surveillance program. It is  not     intended to diagnose MRSA     infection nor to guide or     monitor treatment for     MRSA infections.     Studies: Dg Abd Acute W/chest  11/13/2013   CLINICAL DATA:  Right-sided abdominal and periumbilical pain with vomiting for the past 8 days; diabetes; possible gastric Paris is; upper endoscopy originally scheduled for today was canceled due to hyperglycemia  EXAM: ACUTE ABDOMEN SERIES (ABDOMEN 2 VIEW & CHEST 1 VIEW)  COMPARISON:  Abdominal series of October 14, 2013  FINDINGS: The lungs are adequately inflated and clear. The heart and pulmonary vascularity are normal. There is no pleural effusion. Within the abdomen the colonic stool pattern is unremarkable. There is no small bowel obstruction. There is no evidence of  perforation. There is air and fluid within the stomach. There is a phlebolith in the pelvis. The lumbar spine and bony pelvis are unremarkable where visualized.  IMPRESSION: There is no acute cardiopulmonary abnormality nor acute intra-abdominal abnormality demonstrated on today's study.   Electronically Signed   By: David  Martinique   On: 11/13/2013 15:02    Scheduled Meds: . aspirin  324 mg Oral NOW   Or  . aspirin  300 mg Rectal NOW  . gabapentin  800 mg Oral TID  . insulin aspart  0-15 Units Subcutaneous TID WC  . [START ON 11/15/2013] insulin aspart protamine- aspart  20 Units Subcutaneous BID WC  . lisinopril  20 mg Oral Daily  . metoCLOPramide (REGLAN) injection  10 mg Intravenous 4 times per day  . pantoprazole (PROTONIX) IV  40 mg Intravenous Q12H   Continuous Infusions: . sodium chloride      Active Problems:   DKA (diabetic ketoacidoses)   DKA, type 1   Abdominal pain, epigastric   Hematemesis    Time spent: 15mins    Aseem Sessums  [facility] Pager [phone]. If 7PM-7AM, please contact night-coverage at [email or web address], password [facility] 11/14/2013, 7:04 PM  LOS: 1 day

## 2013-11-14 NOTE — Progress Notes (Signed)
UR chart review completed.  

## 2013-11-14 NOTE — Progress Notes (Signed)
Inpatient Diabetes Program Recommendations  AACE/ADA: New Consensus Statement on Inpatient Glycemic Control (2013)  Target Ranges:  Prepandial:   less than 140 mg/dL      Peak postprandial:   less than 180 mg/dL (1-2 hours)      Critically ill patients:  140 - 180 mg/dL   Results for GARET, HOOTON (MRN [id number]) as of 11/14/2013 13:05  Ref. Range 11/14/2013 05:31 11/14/2013 06:23 11/14/2013 07:39 11/14/2013 09:07 11/14/2013 10:14 11/14/2013 11:50 11/14/2013 12:58  Glucose-Capillary Latest Range: 70-99 mg/dL 179 (H) 168 (H) 146 (H) 135 (H) 160 (H) 175 (H) 179 (H)   Diabetes history: DM1 Outpatient Diabetes medications: 70/30 15 units QAM, 70/30 25 units QHS, Novolin R 2-10 units TID with meals Current orders for Inpatient glycemic control: Novolin R insulin drip via DKA protocol  Inpatient Diabetes Program Recommendations Insulin - IV drip/GlucoStabilizer: Please consider transitioning from IV insulin drip to SQ insulin injections since criteria has been met (according to labs 10/9 at 6:48 CO2 26 and AG 11; CBGs have been in target for 9 consecutive hours. Insulin - Basal: At time of transition, please consider ordering 70/30 18 units QAM with breakfast and 70/30 20 units QPM with supper (equivalent to 26.6 units of basal insulin).  Correction (SSI): At time of transition, please consider ordering Novolog sensitive correction scale ACHS if diet is resumed (if patient will remain NPO then Q4H). HgbA1C: Last A1C 11.4% on 09/11/13. May want to consider ordering an A1C to evaluate glycemic control over the past 2-3 months.  Thanks, Brent Alderman, RN, MSN, CCRN Diabetes Coordinator Inpatient Diabetes Program [phone] (Team Pager) [phone] ([facility] office) [phone] [facility] office)

## 2013-11-14 NOTE — Progress Notes (Signed)
INITIAL NUTRITION ASSESSMENT  DOCUMENTATION CODES Per approved criteria  -Not Applicable   INTERVENTION: -Follow for diet advancement -Pt would benefit from diabetes education once condition has improved  NUTRITION DIAGNOSIS: Inadequate oral intake related to altered GI function as evidenced by NPO.   Goal: Pt will meet >90% of estimated nutritional needs  Monitor:  Diet advancement, PO intake, labs, weight changes, I/O's  Reason for Assessment: MST=3  28 y.o. male  Admitting Dx: <principal problem not specified>  Brent Daniels is a 28 y.o. male  With a history of type 1 diabetes on insulin, diabetic gastroparesis, GERD, peptic ulcer disease, hypertension, neuropathy. The patient has a history of one week of hematemesis there has been worsening. Due to his hematemesis and severe, sharp, nonradiating abdominal pain, which he rates 10 out of 10 and mostly located in the epigastric area, the patient has had limited dietary intake. He comes today for his limited intake, the patient has not been taking his full dose of insulin his blood sugars have been increasing. The patient is followed by Dr. [name] of gastroenterology and was scheduled for a procedure this morning to evaluate his peptic ulcer disease in the OR. The patient's blood sugar was checked in the perioperative area, it was found to be quite elevated and patient was sent to the emergency department for evaluation.  ASSESSMENT: Pt admitted with DKA. Pt with hx of poorly controlled diabetes and peptic ulcer disease. Pt has hx of noncompliance.  Pt is currently NPO. He was somnolent at time of visit. Wt hx reveals fluctuations in weight. Documented wt hx reveals pt weight ranges from 173-185#. Weight fluctuations may likely be related to uncontrolled diabetes.  Nutrition focused physical exam reveals no evidence of fat or muscle wasting.  Labs reviewed. Na: 134, K: 3.3, Glucose: 150, CBGS: 160-179. Latest Hgb A1c reveals poor  control. Pt may benefit from education once condition improves.   Height: Ht Readings from Last 1 Encounters:  11/13/13 6\' 2"  (1.88 m)    Weight: Wt Readings from Last 1 Encounters:  11/13/13 170 lb (77.111 kg)    Ideal Body Weight: 184#  % Ideal Body Weight: 92%  Wt Readings from Last 50 Encounters:  11/13/13 170 lb (77.111 kg)  11/11/13 174 lb (78.926 kg)  10/29/13 174 lb (78.926 kg)  10/14/13 180 lb (81.647 kg)  10/01/13 181 lb 6.4 oz (82.283 kg)  09/11/13 179 lb (81.194 kg)  09/05/13 177 lb (80.287 kg)  08/23/13 185 lb (83.915 kg)  08/19/13 172 lb (78.019 kg)  06/17/13 173 lb 11.6 oz (78.8 kg)  06/17/13 173 lb 11.6 oz (78.8 kg)  07/11/12 180 lb 6.4 oz (81.829 kg)  01/05/12 185 lb (83.915 kg)  01/04/12 185 lb (83.915 kg)  10/07/11 185 lb (83.915 kg)  09/20/11 163 lb (73.936 kg)  08/14/11 180 lb (81.647 kg)  07/04/11 190 lb (86.183 kg)  06/21/11 190 lb (86.183 kg)  05/31/11 173 lb (78.472 kg)  01/25/11 185 lb (83.915 kg)  09/18/10 185 lb (83.915 kg)   Usual Body Weight: 180#  % Usual Body Weight: 94%  BMI:  Body mass index is 21.82 kg/(m^2). Normal weight range  Estimated Nutritional Needs: Kcal: 1900-2100 Protein: 85-95 grams  Fluid: 1.9-2.1 L  Skin: WDL  Diet Order: NPO  EDUCATION NEEDS: -Education not appropriate at this time   Intake/Output Summary (Last 24 hours) at 11/14/13 1301 Last data filed at 11/14/13 1200  Gross per 24 hour  Intake   2000 ml  Output   1950 ml  Net     50 ml    Last BM: 11/12/13  Labs:   Recent Labs Lab 11/14/13 0228 11/14/13 0449 11/14/13 0648  NA 134* 133* 134*  K 3.5* 3.8 3.3*  CL 96 96 97  CO2 24 21 26   BUN 10 9 8   CREATININE 0.63 0.59 0.56  CALCIUM 8.4 8.7 8.4  GLUCOSE 139* 183* 150*    CBG (last 3)   Recent Labs  11/14/13 1014 11/14/13 1150 11/14/13 1258  GLUCAP 160* 175* 179*   Lab Results  Component Value Date   HGBA1C 11.4* 09/11/2013   Scheduled Meds: . aspirin  324 mg Oral NOW    Or  . aspirin  300 mg Rectal NOW  . gabapentin  800 mg Oral TID  . lisinopril  20 mg Oral Daily  . metoCLOPramide (REGLAN) injection  10 mg Intravenous 4 times per day  . pantoprazole (PROTONIX) IV  40 mg Intravenous QHS    Continuous Infusions: . sodium chloride    . dextrose 5 % and 0.45% NaCl 125 mL/hr at 11/14/13 1100  . insulin (NOVOLIN-R) infusion 1.2 mL/hr at 11/14/13 1151    Past Medical History  Diagnosis Date  . Diabetes mellitus     type 1  . Arthritis   . Back pain   . GERD (gastroesophageal reflux disease)   . PUD (peptic ulcer disease)   . Hypertension   . Heart murmur     valvular stenosis  . Neuropathy   . Diabetic gastroparesis associated with type 1 diabetes mellitus   . S/P arthroscopic knee surgery 07/04/2011  . Scoliosis 07/11/2012    Past Surgical History  Procedure Laterality Date  . Tympanostomy tube placement    . Knee arthroscopy  06/30/2011    Procedure: ARTHROSCOPY KNEE;  Surgeon: Carole Civil, MD;  Location: [facility];  Service: Orthopedics;  Laterality: Right;  diagnostic arthroscopy  . Esophagogastroduodenoscopy (egd) with propofol  06/17/2013    Dr. Oneida Alar: two gastric ulcers in fundus, mild antral gastritis, candida esophagitis  . Esophageal biopsy  06/17/2013    Procedure: GASTRIC ULCER AND ANTRAL BIOPSIES; ESOPHAGEAL BRUSHING;  Surgeon: Danie Binder, MD;  Location: [facility];  Service: Endoscopy;;  . Esophagogastroduodenoscopy (egd) with propofol N/A 09/11/2013    Dr. [name]: abnormal hypopharynx, question massively enlarged tonsils, stomach full of food precluded exam, needs EGD for verification of ulcer healing at a later date   Latrice Storlie A. Jimmye Norman, RD, LDN Pager: [phone]

## 2013-11-14 NOTE — Progress Notes (Signed)
Brent Daniels was notified about [name]. Order received to continue with insulin drip until AG is 12 or less, and give 2 runs of Potassium. Continue to monitor the patient.

## 2013-11-15 LAB — CBC
HCT: 38.7 % — ABNORMAL LOW (ref 39.0–52.0)
HEMOGLOBIN: 13 g/dL (ref 13.0–17.0)
MCH: 30 pg (ref 26.0–34.0)
MCHC: 33.6 g/dL (ref 30.0–36.0)
MCV: 89.2 fL (ref 78.0–100.0)
Platelets: 239 10*3/uL (ref 150–400)
RBC: 4.34 MIL/uL (ref 4.22–5.81)
RDW: 12.7 % (ref 11.5–15.5)
WBC: 8.7 10*3/uL (ref 4.0–10.5)

## 2013-11-15 LAB — GLUCOSE, CAPILLARY
GLUCOSE-CAPILLARY: 134 mg/dL — AB (ref 70–99)
Glucose-Capillary: 207 mg/dL — ABNORMAL HIGH (ref 70–99)
Glucose-Capillary: 153 mg/dL — ABNORMAL HIGH (ref 70–99)
Glucose-Capillary: 121 mg/dL — ABNORMAL HIGH (ref 70–99)

## 2013-11-15 LAB — BASIC METABOLIC PANEL
Anion gap: 8 (ref 5–15)
BUN: 3 mg/dL — AB (ref 6–23)
CHLORIDE: 98 meq/L (ref 96–112)
CO2: 30 meq/L (ref 19–32)
Calcium: 8.5 mg/dL (ref 8.4–10.5)
Creatinine, Ser: 0.6 mg/dL (ref 0.50–1.35)
GFR calc Af Amer: >90 mL/min (ref >90)
GFR calc non Af Amer: >90 mL/min (ref >90)
GLUCOSE: 216 mg/dL — AB (ref 70–99)
POTASSIUM: 3.7 meq/L (ref 3.7–5.3)
Sodium: 136 mEq/L — ABNORMAL LOW (ref 137–147)

## 2013-11-15 MED ORDER — ZOLPIDEM TARTRATE 5 MG PO TABS
5.0000 mg | ORAL_TABLET | Freq: Once | ORAL | Status: AC
  Administered 2013-11-16: 5 mg via ORAL
  Filled 2013-11-15: qty 1

## 2013-11-15 MED ORDER — OXYCODONE-ACETAMINOPHEN 5-325 MG PO TABS
1.0000 | ORAL_TABLET | ORAL | Status: DC | PRN
  Administered 2013-11-15 – 2013-11-16 (×4): 2 via ORAL
  Administered 2013-11-16: 1 via ORAL
  Administered 2013-11-16 (×3): 2 via ORAL
  Filled 2013-11-15 (×7): qty 2
  Filled 2013-11-15: qty 1

## 2013-11-15 NOTE — Progress Notes (Signed)
Pt to be transferred upstairs to 300 as a med-surg pt. Pt is aware and agreeable to transfer. Receiving RN has been given report. All pt belongings will transfer with pt.

## 2013-11-15 NOTE — Progress Notes (Signed)
[facility] PROGRESS NOTE  Brent Daniels [id number] DOB: [birth year] DOA: 11/13/2013 PCP: Brent Rim, MD  Assessment/Plan: 1. Diabetic ketoacidosis. Improved with IV fluids and insulin infusion. Anion gap is closing and he was transitioned to subcutaneous insulin. We'll check hemoglobin A1c. Advance diet as tolerated. Blood sugars currently stable 2. Nausea and vomiting. Likely related to peptic ulcer versus gastroparesis. Continue Reglan, Protonix. Advance diet to carb mod 3. Hematemesis. Question Mallory-Weiss tear. Plans are for endoscopy on 10/12 in the operating room. Continue supportive treatment for now. Monitor CBC for now 4. Gastroparesis. Continue Reglan 5. Diabetic neuropathy. Continue gabapentin 6. Hypertension. Continue lisinopril  Code Status: Full code Family Communication: discussed with patient Disposition Plan: discharge home once improved   Consultants:  Gastroenterology  Procedures:    Antibiotics:    HPI/Subjective: Wants to eat solid food, no vomiting, still has epigastric discomfort and back pain  Objective: Filed Vitals:   11/15/13 1000  BP: 140/89  Pulse:   Temp:   Resp: 16    Intake/Output Summary (Last 24 hours) at 11/15/13 1140 Last data filed at 11/15/13 1100  Gross per 24 hour  Intake   2800 ml  Output   6751 ml  Net  -3951 ml   Filed Weights   11/13/13 1248 11/14/13 2100 11/15/13 0426  Weight: 77.111 kg (170 lb) 78 kg (171 lb 15.3 oz) 78 kg (171 lb 15.3 oz)    Exam:   General:  NAD  Cardiovascular: S1, S2 RRR  Respiratory: CTA B  Abdomen: soft, tender in epigastrium, bs+  Musculoskeletal: no edema b/l   Data Reviewed: Basic Metabolic Panel:  Recent Labs Lab 11/14/13 0031 11/14/13 0228 11/14/13 0449 11/14/13 0648 11/15/13 0831  NA 135* 134* 133* 134* 136*  K 3.3* 3.5* 3.8 3.3* 3.7  CL 96 96 96 97 98  CO2 24 24 21 26 30   GLUCOSE 119* 139* 183* 150* 216*  BUN 10 10 9 8  3*  CREATININE 0.67  0.63 0.59 0.56 0.60  CALCIUM 8.7 8.4 8.7 8.4 8.5   Liver Function Tests:  Recent Labs Lab 11/11/13 1051 11/13/13 1254  AST 16 10  ALT 16 10  ALKPHOS 94 82  BILITOT 1.3* 1.4*  PROT 7.9 7.3  ALBUMIN 4.5 4.0    Recent Labs Lab 11/11/13 1051 11/13/13 1254  LIPASE 9* 8*   No results found for this basename: AMMONIA,  in the last 168 hours CBC:  Recent Labs Lab 11/11/13 1051 11/13/13 1254 11/14/13 0449 11/15/13 0831  WBC 9.2 15.4* 14.7* 8.7  NEUTROABS 7.9*  --   --   --   HGB 14.3 13.5 12.7* 13.0  HCT 40.9 39.6 36.4* 38.7*  MCV 87.4 88.4 87.3 89.2  PLT 335 320 289 239   Cardiac Enzymes: No results found for this basename: CKTOTAL, CKMB, CKMBINDEX, TROPONINI,  in the last 168 hours BNP (last 3 results) No results found for this basename: PROBNP,  in the last 8760 hours CBG:  Recent Labs Lab 11/14/13 1735 11/14/13 1828 11/14/13 2100 11/15/13 0726 11/15/13 1125  GLUCAP 158* 142* 185* 207* 134*    Recent Results (from the past 240 hour(s))  MRSA PCR SCREENING     Status: None   Collection Time    11/13/13  3:20 PM      Result Value Ref Range Status   MRSA by PCR NEGATIVE  NEGATIVE Final   Comment:            The GeneXpert MRSA Assay (FDA  approved for NASAL specimens     only), is one component of a     comprehensive MRSA colonization     surveillance program. It is not     intended to diagnose MRSA     infection nor to guide or     monitor treatment for     MRSA infections.     Studies: Dg Abd Acute W/chest  11/13/2013   CLINICAL DATA:  Right-sided abdominal and periumbilical pain with vomiting for the past 8 days; diabetes; possible gastric [place] is; upper endoscopy originally scheduled for today was canceled due to hyperglycemia  EXAM: ACUTE ABDOMEN SERIES (ABDOMEN 2 VIEW & CHEST 1 VIEW)  COMPARISON:  Abdominal series of October 14, 2013  FINDINGS: The lungs are adequately inflated and clear. The heart and pulmonary vascularity are normal. There  is no pleural effusion. Within the abdomen the colonic stool pattern is unremarkable. There is no small bowel obstruction. There is no evidence of perforation. There is air and fluid within the stomach. There is a phlebolith in the pelvis. The lumbar spine and bony pelvis are unremarkable where visualized.  IMPRESSION: There is no acute cardiopulmonary abnormality nor acute intra-abdominal abnormality demonstrated on today's study.   Electronically Signed   By: Brent  Daniels   On: 11/13/2013 15:02    Scheduled Meds: . gabapentin  800 mg Oral TID  . insulin aspart  0-15 Units Subcutaneous TID WC  . insulin aspart protamine- aspart  20 Units Subcutaneous BID WC  . lisinopril  20 mg Oral Daily  . metoCLOPramide (REGLAN) injection  10 mg Intravenous 4 times per day  . pantoprazole (PROTONIX) IV  40 mg Intravenous Q12H   Continuous Infusions: . sodium chloride 100 mL/hr at 11/15/13 1100    Active Problems:   DKA (diabetic ketoacidoses)   DKA, type 1   Abdominal pain, epigastric   Hematemesis    Time spent: 21mins    Brent Daniels  [facility] Pager [phone]. If 7PM-7AM, please contact night-coverage at [email or web address], password [facility] 11/15/2013, 11:40 AM  LOS: 2 days

## 2013-11-16 DIAGNOSIS — R1110 Vomiting, unspecified: Secondary | ICD-10-CM

## 2013-11-16 LAB — HEMOGLOBIN A1C
Hgb A1c MFr Bld: 11.3 % — ABNORMAL HIGH (ref <5.7)
Mean Plasma Glucose: 278 mg/dL — ABNORMAL HIGH (ref <117)

## 2013-11-16 LAB — GLUCOSE, CAPILLARY
GLUCOSE-CAPILLARY: 273 mg/dL — AB (ref 70–99)
Glucose-Capillary: 175 mg/dL — ABNORMAL HIGH (ref 70–99)
Glucose-Capillary: 250 mg/dL — ABNORMAL HIGH (ref 70–99)
Glucose-Capillary: 118 mg/dL — ABNORMAL HIGH (ref 70–99)
Glucose-Capillary: 163 mg/dL — ABNORMAL HIGH (ref 70–99)

## 2013-11-16 MED ORDER — HYDROMORPHONE HCL 1 MG/ML IJ SOLN
0.5000 mg | INTRAMUSCULAR | Status: DC | PRN
  Administered 2013-11-16 – 2013-11-17 (×7): 0.5 mg via INTRAVENOUS
  Filled 2013-11-16 (×7): qty 1

## 2013-11-16 NOTE — Progress Notes (Addendum)
[facility] PROGRESS NOTE  Brent Daniels [id number] DOB: Jun 11, 1985 DOA: 11/13/2013 PCP: [name], MD  Assessment/Plan: 1. Diabetic ketoacidosis. Improved with IV fluids and insulin infusion. Anion gap is closing and he was transitioned to subcutaneous insulin. We'll check hemoglobin A1c. Advance diet as tolerated. Blood sugars currently stable 2. Nausea and vomiting. Likely related to peptic ulcer versus gastroparesis. Continue Reglan, Protonix. Unfortunately, diet was advanced to carb modified over the weekend. May need to post pone EGD to outpatient. Will discuss with GI tomorrow. 3. Hematemesis. Question Mallory-Weiss tear. Plans are for endoscopy on 10/12 in the operating room. Continue supportive treatment for now. Hemoglobin stable, no further vomiting. 4. Gastroparesis. Continue Reglan 5. Diabetic neuropathy. Continue gabapentin 6. Hypertension. Continue lisinopril  Code Status: Full code Family Communication: discussed with patient Disposition Plan: discharge home once improved   Consultants:  Gastroenterology  Procedures:    Antibiotics:    HPI/Subjective: No vomiting, still has epigastric pain. Wants to get EGD done tomorrow  Objective: Filed Vitals:   11/16/13 1351  BP: 134/88  Pulse: 87  Temp: 98.4 F (36.9 C)  Resp: 18    Intake/Output Summary (Last 24 hours) at 11/16/13 1907 Last data filed at 11/16/13 1700  Gross per 24 hour  Intake   2240 ml  Output      0 ml  Net   2240 ml   Filed Weights   11/14/13 2100 11/15/13 0426 11/16/13 0500  Weight: 78 kg (171 lb 15.3 oz) 78 kg (171 lb 15.3 oz) 78.064 kg (172 lb 1.6 oz)    Exam:   General:  NAD  Cardiovascular: S1, S2 RRR  Respiratory: CTA B  Abdomen: soft, tender in epigastrium, bs+  Musculoskeletal: no edema b/l   Data Reviewed: Basic Metabolic Panel:  Recent Labs Lab 11/14/13 0031 11/14/13 0228 11/14/13 0449 11/14/13 0648 11/15/13 0831  NA 135* 134* 133* 134*  136*  K 3.3* 3.5* 3.8 3.3* 3.7  CL 96 96 96 97 98  CO2 24 24 21 26 30   GLUCOSE 119* 139* 183* 150* 216*  BUN 10 10 9 8  3*  CREATININE 0.67 0.63 0.59 0.56 0.60  CALCIUM 8.7 8.4 8.7 8.4 8.5   Liver Function Tests:  Recent Labs Lab 11/11/13 1051 11/13/13 1254  AST 16 10  ALT 16 10  ALKPHOS 94 82  BILITOT 1.3* 1.4*  PROT 7.9 7.3  ALBUMIN 4.5 4.0    Recent Labs Lab 11/11/13 1051 11/13/13 1254  LIPASE 9* 8*   No results found for this basename: AMMONIA,  in the last 168 hours CBC:  Recent Labs Lab 11/11/13 1051 11/13/13 1254 11/14/13 0449 11/15/13 0831  WBC 9.2 15.4* 14.7* 8.7  NEUTROABS 7.9*  --   --   --   HGB 14.3 13.5 12.7* 13.0  HCT 40.9 39.6 36.4* 38.7*  MCV 87.4 88.4 87.3 89.2  PLT 335 320 289 239   Cardiac Enzymes: No results found for this basename: CKTOTAL, CKMB, CKMBINDEX, TROPONINI,  in the last 168 hours BNP (last 3 results) No results found for this basename: PROBNP,  in the last 8760 hours CBG:  Recent Labs Lab 11/15/13 2149 11/16/13 0240 11/16/13 0801 11/16/13 1136 11/16/13 1644  GLUCAP 121* 175* 273* 250* 118*    Recent Results (from the past 240 hour(s))  MRSA PCR SCREENING     Status: None   Collection Time    11/13/13  3:20 PM      Result Value Ref Range Status   MRSA  by PCR NEGATIVE  NEGATIVE Final   Comment:            The GeneXpert MRSA Assay (FDA     approved for NASAL specimens     only), is one component of a     comprehensive MRSA colonization     surveillance program. It is not     intended to diagnose MRSA     infection nor to guide or     monitor treatment for     MRSA infections.     Studies: No results found.  Scheduled Meds: . gabapentin  800 mg Oral TID  . insulin aspart  0-15 Units Subcutaneous TID WC  . insulin aspart protamine- aspart  20 Units Subcutaneous BID WC  . lisinopril  20 mg Oral Daily  . metoCLOPramide (REGLAN) injection  10 mg Intravenous 4 times per day  . pantoprazole (PROTONIX) IV  40  mg Intravenous Q12H   Continuous Infusions: . sodium chloride 100 mL/hr at 11/16/13 [id number]    Active Problems:   DKA (diabetic ketoacidoses)   DKA, type 1   Abdominal pain, epigastric   Hematemesis    Time spent: 56mins    Coran Dipaola  [facility] Pager [phone]. If 7PM-7AM, please contact night-coverage at [email or web address], password [facility] 11/16/2013, 7:07 PM  LOS: 3 days

## 2013-11-17 ENCOUNTER — Encounter (HOSPITAL_COMMUNITY)
Admission: EM | Disposition: A | Discharge status: Home / Self Care | Payer: Self-pay | Source: Home / Self Care | Attending: Internal Medicine

## 2013-11-17 ENCOUNTER — Encounter: Payer: Self-pay | Admitting: Gastroenterology

## 2013-11-17 ENCOUNTER — Telehealth: Payer: Self-pay | Admitting: Gastroenterology

## 2013-11-17 LAB — GLUCOSE, CAPILLARY: GLUCOSE-CAPILLARY: 297 mg/dL — AB (ref 70–99)

## 2013-11-17 SURGERY — ESOPHAGOGASTRODUODENOSCOPY (EGD) WITH PROPOFOL
Anesthesia: Monitor Anesthesia Care

## 2013-11-17 MED ORDER — OXYCODONE-ACETAMINOPHEN 5-325 MG PO TABS: 30.0000 | ORAL_TABLET | ORAL | Status: DC | PRN

## 2013-11-17 MED ORDER — SODIUM CHLORIDE 0.9 % IV SOLN
INTRAVENOUS | Status: DC
  Administered 2013-11-17: 12:00:00 via INTRAVENOUS

## 2013-11-17 MED ORDER — INSULIN NPH ISOPHANE & REGULAR (70-30) 100 UNIT/ML ~~LOC~~ SUSP
20.0000 [IU] | Freq: Two times a day (BID) | SUBCUTANEOUS | Status: DC
Start: 2013-11-17 — End: 2016-08-27

## 2013-11-17 MED ORDER — LORAZEPAM 2 MG/ML IJ SOLN
0.5000 mg | Freq: Once | INTRAMUSCULAR | Status: AC
  Administered 2013-11-17: 0.5 mg via INTRAVENOUS
  Filled 2013-11-17: qty 1

## 2013-11-17 NOTE — Telephone Encounter (Signed)
This was taken care of while pt was an inpatient at [facility].

## 2013-11-17 NOTE — Plan of Care (Signed)
Problem: Phase I Progression Outcomes Goal: Nausea/vomiting controlled with antiemetics Educated on different ways to control N/V and also to not ignore these symptoms.Marland Kitchenas they may be s/sx of high or low Blood sugars

## 2013-11-17 NOTE — Care Management Note (Signed)
    Page 1 of 1   11/17/2013     11:17:33 AM CARE MANAGEMENT NOTE 11/17/2013  Patient:  Brent Daniels, Brent Daniels   Account Number:  [id number]  Date Initiated:  11/17/2013  Documentation initiated by:  Theophilus Kinds  Subjective/Objective Assessment:   Pt admitted from home with DKA. Pt lives with his mother and will return home at discharge. Pt is independent with ADL's.     Action/Plan:   Pt for discharge today. No CM needs noted.   Anticipated DC Date:  11/17/2013   Anticipated DC Plan:  [place]  CM consult      Choice offered to / List presented to:             Status of service:  Completed, signed off Medicare Important Message given?   (If response is "NO", the following Medicare IM given date fields will be blank) Date Medicare IM given:   Medicare IM given by:   Date Additional Medicare IM given:   Additional Medicare IM given by:    Discharge Disposition:  HOME/SELF CARE  Per UR Regulation:    If discussed at Long Length of Stay Meetings, dates discussed:    Comments:  11/17/13 [place], RN BSN CM

## 2013-11-17 NOTE — Progress Notes (Signed)
Paged MD twice to get patient something for anxiety. Elmo Putt RN

## 2013-11-17 NOTE — Progress Notes (Signed)
Inpatient Diabetes Program Recommendations  AACE/ADA: New Consensus Statement on Inpatient Glycemic Control (2013)  Target Ranges:  Prepandial:   less than 140 mg/dL      Peak postprandial:   less than 180 mg/dL (1-2 hours)      Critically ill patients:  140 - 180 mg/dL   Results for CURVIN, HAGGAN (MRN [id number]) as of 11/17/2013 07:58  Ref. Range 11/15/2013 11:25 11/15/2013 16:40 11/15/2013 21:49 11/16/2013 02:40 11/16/2013 08:01 11/16/2013 11:36 11/16/2013 16:44 11/16/2013 22:38  Glucose-Capillary Latest Range: 70-99 mg/dL 134 (H) 153 (H) 121 (H) 175 (H) 273 (H) 250 (H) 118 (H) 163 (H)   Diabetes history: DM1 Outpatient Diabetes medications: 70/30 15 units QAM, 70/30 25 units QHS, Novolin R 2-10 units TID with meals  Current orders for Inpatient glycemic control: 70/30 20 units BID, Novolog 0-15 units [facility]  Inpatient Diabetes Program Recommendations Insulin - Basal: Noted patient is NPO for Endoscopy in OR today. Since patient is NPO, recommend holding am dose of 70/30 and ordering one time dose of Lantus 8 units x1 now. Correction (SSI): Please consider changing CBGs and Novolog correction to Q4H while NPO.  Note: Patient is currently NPO for Endoscopy in OR today according to the chart and progress notes. Talked with Rodman Key, RN and he reports that CBG this am is 163 mg/dl and patient is indeed NPO for procedure. Milagros Reap, RN in [place] [facility] area and patient is not on OR list today. Then talked with Tommie, RN in Endo room with Dr. [name] and patient is not on OR list and Dr. [name] has not seen any inpatients yet. According to Tommie, RN, If patient is added to schedule he will be done at the end of the day. Explained that patient is a type 1 diabetic and is on 70/30 insulin and since he is NPO would not recommend patient get 70/30 this morning but would appreciate some idea of when he will be scheduled in OR today. Therefore, at this time recommend patient's am dose of 70/30 not be  given, order one time dose of Lantus 8 units for now, and change CBGs and Novolog correction to Q4H while NPO. Paged Dr. Roderic Palau to discuss.  Thanks, Barnie Alderman, RN, MSN, CCRN Diabetes Coordinator Inpatient Diabetes Program [phone] (Team Pager) [phone] ([facility] office) [phone] [facility] office)

## 2013-11-17 NOTE — Telephone Encounter (Signed)
CAN WE PUT HIM NOV 3RD IN AN URGENT SLOT WITH YOU.

## 2013-11-17 NOTE — Telephone Encounter (Signed)
APPT MADE AND LETTER SENT  °

## 2013-11-17 NOTE — Progress Notes (Addendum)
    Subjective: Constant right-sided abdominal pain and epigastric pain. Associated with nausea. Wants to have EGD today. Blood sugars under better control. Hungry. Wants to eat.   Objective: Vital signs in last 24 hours: Temp:  [97.8 F (36.6 C)-98.9 F (37.2 C)] 97.8 F (36.6 C) (10/12 0622) Pulse Rate:  [84-87] 84 (10/12 0622) Resp:  [18-20] 20 (10/12 0622) BP: (134-148)/(88-96) 144/90 mmHg (10/12 0824) SpO2:  [95 %-100 %] 99 % (10/12 0622) Weight:  [168 lb 9.6 oz (76.476 kg)] 168 lb 9.6 oz (76.476 kg) (10/12 0500) Last BM Date: 11/14/13 General:   Alert and oriented, flat affect Head:  Normocephalic and atraumatic. Eyes:  No icterus, sclera clear. Conjuctiva pink.  Abdomen:  Bowel sounds present, soft, TTP to right of umbilicus, RUQ, lower abdomen Msk:  Symmetrical without gross deformities. Normal posture. Neurologic:  Alert and  oriented x4;  grossly normal neurologically. Skin:  Warm and dry, intact without significant lesions.   Intake/Output from previous day: 10/11 0701 - 10/12 0700 In: 2000 [P.O.:1200; I.V.:800] Out: -  Intake/Output this shift:    Lab Results:  Recent Labs  11/15/13 0831  WBC 8.7  HGB 13.0  HCT 38.7*  PLT 239   BMET  Recent Labs  11/15/13 0831  NA 136*  K 3.7  CL 98  CO2 30  GLUCOSE 216*  BUN 3*  CREATININE 0.60  CALCIUM 8.5   Assessment: 28 y/o male with uncontrolled DM, suspected gastroparesis, PUD who was admitted with DKA. EGD cancelled last week due to hyperglycemia, and patient subsequently admitted. Needs EGD for surveillance due to history of PUD, persistent abdominal pain, N/V. Gallbladder remains in situ; unable to completely exclude a biliary component if EGD notes appropriate healing. Likely chronic abdominal pain contributing as well.   Plan: Remain NPO EGD today with Dr. [name] with Propofol Continue PPI BID Reglan 10 mg QID Consider [place] of abdomen if EGD normal Tight glycemic control   Orvil Feil,  ANP-BC [facility]    LOS: 4 days    11/17/2013, 8:37 AM    Addendum at 1000: Due to patient receiving regular diet over the weekend, it would be best to postpone EGD for today. Option #1: clear liquids today with EGD tomorrow or Option #2: Discharge today with outpatient follow-up to arrange EGD. [place] of abdomen can be arranged today regardless. US abdomen ordered.   Orvil Feil, ANP-BC [facility]

## 2013-11-17 NOTE — Discharge Planning (Signed)
Pt stated he was ready to be [facility] and got pain meds just before leaving (since someone else will be driving him home). IV removed and pt educated on future s/sx of hyperglycemia and how to prevent.  Pt FU with [facility] was made and pt asked to consult with PCP to get referral to pain management Dr.  Abbott Pao will be wheeled to car once ride arrives.

## 2013-11-17 NOTE — Discharge Summary (Signed)
Physician Discharge Summary  Brent Daniels [id number] DOB: 01/05/1986 DOA: 11/13/2013  PCP: [name], MD  Admit date: 11/13/2013 Discharge date: 11/17/2013  Time spent: 40 minutes  Recommendations for Outpatient Follow-up:  1. followup with gastroenterology to pursue abdominal ultrasound and EGD with deep sedation 2. Follow up will be scheduled with Dr. Dorris Fetch 3. He will be referred to Dr. Merlene Laughter for pain management  Discharge Diagnoses:  Active Problems:   DKA (diabetic ketoacidoses)   DKA, type 1   Abdominal pain, epigastric   Hematemesis  nausea and vomiting Diabetic gastroparesis Diabetic neuropathy Essential hypertension  Discharge Condition: stable  Diet recommendation: low carb  Filed Weights   11/15/13 0426 11/16/13 0500 11/17/13 0500  Weight: 78 kg (171 lb 15.3 oz) 78.064 kg (172 lb 1.6 oz) 76.476 kg (168 lb 9.6 oz)    History of present illness and hospital course:  This is a gentleman with type 1 diabetes, diabetic gastroparesis who presented to [facility] for scheduled endoscopy in the OR under deep sedation. On evaluation, patient to be tachycardic and hyperglycemic. He was sent to the emergency room where labs were drawn and he was found to be in diabetic ketoacidosis. He was admitted to the hospital started on IV fluids and insulin infusion per protocol. His anion gap closed and he was transitioned back to subcutaneous insulin. Blood sugars have been fair. He'll be referred to an endocrinologist on discharge.  Regarding his nausea, vomiting and epigastric pain, he was followed by gastroenterology. Plans were for endoscopy as well as possible abdominal ultrasound. The patient requested to be discharged today and have these tests done as an outpatient. This was discussed with GI and it was felt appropriate to pursue these procedures as an outpatient. The remainder of his medical problems have been stable he'll be discharged today. He is tolerating  a solid diet without any vomiting.  Procedures:    Consultations:  gastroenterology  Discharge Exam: Filed Vitals:   11/17/13 1411  BP: 142/81  Pulse: 97  Temp: 98.2 F (36.8 C)  Resp: 20    General: NAD Cardiovascular: S1, S2 RRR Respiratory: CTA B Abd: epigastric tenderness, soft, bs+  Discharge Instructions You were cared for by a hospitalist during your hospital stay. If you have any questions about your discharge medications or the care you received while you were in the hospital after you are discharged, you can call the unit and asked to speak with the hospitalist on call if the hospitalist that took care of you is not available. Once you are discharged, your primary care physician will handle any further medical issues. Please note that NO REFILLS for any discharge medications will be authorized once you are discharged, as it is imperative that you return to your primary care physician (or establish a relationship with a primary care physician if you do not have one) for your aftercare needs so that they can reassess your need for medications and monitor your lab values.  Discharge Instructions   Call MD for:  persistant nausea and vomiting    Complete by:  As directed      Call MD for:  severe uncontrolled pain    Complete by:  As directed      Diet - low sodium heart healthy    Complete by:  As directed      Increase activity slowly    Complete by:  As directed           Current Discharge Medication  List    CONTINUE these medications which have CHANGED   Details  insulin NPH-regular Human (NOVOLIN 70/30) (70-30) 100 UNIT/ML injection Inject 20 Units into the skin 2 (two) times daily with a meal. 15 units in the morning and 25 at night. Qty: 10 mL, Refills: 11    oxyCODONE-acetaminophen (PERCOCET) 5-325 MG per tablet Take 30 tablets by mouth every 4 (four) hours as needed. Qty: 35 tablet, Refills: 0      CONTINUE these medications which have NOT CHANGED    Details  bismuth subsalicylate (PEPTO BISMOL) 262 MG/15ML suspension Take 30 mLs by mouth every 6 (six) hours as needed for indigestion or diarrhea or loose stools.    gabapentin (NEURONTIN) 800 MG tablet Take 800 mg by mouth 3 (three) times daily.    insulin regular (NOVOLIN R,HUMULIN R) 100 units/mL injection Inject 2-10 Units into the skin 3 (three) times daily before meals. Sliding scale.    lisinopril (PRINIVIL,ZESTRIL) 20 MG tablet Take 20 mg by mouth daily.    metoCLOPramide (REGLAN) 10 MG tablet Take 1 tablet (10 mg total) by mouth every 6 (six) hours. Qty: 30 tablet, Refills: 0    pantoprazole (PROTONIX) 40 MG tablet Take 1 tablet (40 mg total) by mouth 2 (two) times daily before a meal. Qty: 60 tablet, Refills: 2    pravastatin (PRAVACHOL) 20 MG tablet Take 20 mg by mouth daily.     promethazine (PHENERGAN) 25 MG tablet Take 1 tablet (25 mg total) by mouth every 6 (six) hours as needed. Qty: 20 tablet, Refills: 0    tiZANidine (ZANAFLEX) 4 MG tablet Take 1 tablet by mouth every 6 (six) hours as needed for muscle spasms.        Allergies  Allergen Reactions  . Hydrocodone Hives  . Ibuprofen Other (See Comments)    Stomach ulcers  . Naproxen Other (See Comments)    Stomach ulcers  . Sulfa Antibiotics Other (See Comments)    Stomach ulcers  . Tramadol   . Morphine And Related Rash      The results of significant diagnostics from this hospitalization (including imaging, microbiology, ancillary and laboratory) are listed below for reference.    Significant Diagnostic Studies: Ct Abdomen Pelvis W Contrast  11/11/2013   CLINICAL DATA:  Right lower quadrant pain  EXAM: CT ABDOMEN AND PELVIS WITH CONTRAST  TECHNIQUE: Multidetector CT imaging of the abdomen and pelvis was performed using the standard protocol following bolus administration of intravenous contrast.  CONTRAST:  170mL OMNIPAQUE IOHEXOL 300 MG/ML  SOLN  COMPARISON:  CT 08/23/2013  FINDINGS: Lung bases are  clear.  Heart size normal  Liver gallbladder and bile ducts are normal. Atrophic pancreas without edema or mass. Spleen is normal. Kidneys show no obstruction or mass. No renal calculi  Negative for bowel obstruction. No bowel thickening or mass. No adenopathy or free fluid.  Lumbar spine is negative.  IMPRESSION: Negative   Electronically Signed   By: Franchot Gallo M.D.   On: 11/11/2013 14:10   Dg Abd Acute W/chest  11/13/2013   CLINICAL DATA:  Right-sided abdominal and periumbilical pain with vomiting for the past 8 days; diabetes; possible gastric [place] is; upper endoscopy originally scheduled for today was canceled due to hyperglycemia  EXAM: ACUTE ABDOMEN SERIES (ABDOMEN 2 VIEW & CHEST 1 VIEW)  COMPARISON:  Abdominal series of October 14, 2013  FINDINGS: The lungs are adequately inflated and clear. The heart and pulmonary vascularity are normal. There is no pleural effusion. Within  the abdomen the colonic stool pattern is unremarkable. There is no small bowel obstruction. There is no evidence of perforation. There is air and fluid within the stomach. There is a phlebolith in the pelvis. The lumbar spine and bony pelvis are unremarkable where visualized.  IMPRESSION: There is no acute cardiopulmonary abnormality nor acute intra-abdominal abnormality demonstrated on today's study.   Electronically Signed   By: David  Martinique   On: 11/13/2013 15:02    Microbiology: Recent Results (from the past 240 hour(s))  MRSA PCR SCREENING     Status: None   Collection Time    11/13/13  3:20 PM      Result Value Ref Range Status   MRSA by PCR NEGATIVE  NEGATIVE Final   Comment:            The GeneXpert MRSA Assay (FDA     approved for NASAL specimens     only), is one component of a     comprehensive MRSA colonization     surveillance program. It is not     intended to diagnose MRSA     infection nor to guide or     monitor treatment for     MRSA infections.     Labs: Basic Metabolic Panel:  Recent  Labs Lab 11/14/13 0031 11/14/13 0228 11/14/13 0449 11/14/13 0648 11/15/13 0831  NA 135* 134* 133* 134* 136*  K 3.3* 3.5* 3.8 3.3* 3.7  CL 96 96 96 97 98  CO2 24 24 21 26 30   GLUCOSE 119* 139* 183* 150* 216*  BUN 10 10 9 8  3*  CREATININE 0.67 0.63 0.59 0.56 0.60  CALCIUM 8.7 8.4 8.7 8.4 8.5   Liver Function Tests:  Recent Labs Lab 11/11/13 1051 11/13/13 1254  AST 16 10  ALT 16 10  ALKPHOS 94 82  BILITOT 1.3* 1.4*  PROT 7.9 7.3  ALBUMIN 4.5 4.0    Recent Labs Lab 11/11/13 1051 11/13/13 1254  LIPASE 9* 8*   No results found for this basename: AMMONIA,  in the last 168 hours CBC:  Recent Labs Lab 11/11/13 1051 11/13/13 1254 11/14/13 0449 11/15/13 0831  WBC 9.2 15.4* 14.7* 8.7  NEUTROABS 7.9*  --   --   --   HGB 14.3 13.5 12.7* 13.0  HCT 40.9 39.6 36.4* 38.7*  MCV 87.4 88.4 87.3 89.2  PLT 335 320 289 239   Cardiac Enzymes: No results found for this basename: CKTOTAL, CKMB, CKMBINDEX, TROPONINI,  in the last 168 hours BNP: BNP (last 3 results) No results found for this basename: PROBNP,  in the last 8760 hours CBG:  Recent Labs Lab 11/16/13 0801 11/16/13 1136 11/16/13 1644 11/16/13 2238 11/17/13 1148  GLUCAP 273* 250* 118* 163* 297*       Signed:  MEMON,JEHANZEB  [facility] 11/17/2013, 2:12 PM

## 2013-11-17 NOTE — Telephone Encounter (Signed)
Patient to be discharged today. Needs office follow-up in next 2 weeks or so. We will then schedule him for surveillance EGD with Propofol.  Let's set up an outpatient abdominal ultrasound in the interim.

## 2013-11-17 NOTE — Progress Notes (Signed)
[place] of abdomen cancelled. Will pursue as outpatient. EGD to be arranged as outpatient. Will need OV with [place] first. Patient desires to go home.  Orvil Feil, ANP-BC [facility]

## 2013-11-18 NOTE — Progress Notes (Signed)
UR chart review completed.  

## 2013-11-20 ENCOUNTER — Other Ambulatory Visit: Payer: Self-pay

## 2013-11-20 DIAGNOSIS — R109 Unspecified abdominal pain: Secondary | ICD-10-CM

## 2013-11-26 ENCOUNTER — Ambulatory Visit (HOSPITAL_COMMUNITY)
Admission: RE | Admit: 2013-11-26 | Discharge: 2013-11-26 | Disposition: A | Discharge status: Home / Self Care | Payer: Managed Care, Other (non HMO) | Source: Ambulatory Visit | Attending: Gastroenterology | Admitting: Gastroenterology

## 2013-11-26 DIAGNOSIS — R1013 Epigastric pain: Secondary | ICD-10-CM | POA: Diagnosis present

## 2013-11-26 DIAGNOSIS — R112 Nausea with vomiting, unspecified: Secondary | ICD-10-CM | POA: Diagnosis not present

## 2013-11-26 DIAGNOSIS — R109 Unspecified abdominal pain: Secondary | ICD-10-CM

## 2013-11-26 DIAGNOSIS — G8929 Other chronic pain: Secondary | ICD-10-CM | POA: Diagnosis not present

## 2013-11-28 ENCOUNTER — Ambulatory Visit: Payer: Self-pay | Admitting: Internal Medicine

## 2013-11-29 ENCOUNTER — Emergency Department (HOSPITAL_COMMUNITY)
Admission: EM | Admit: 2013-11-29 | Discharge: 2013-11-30 | Disposition: A | Discharge status: Home / Self Care | Payer: Managed Care, Other (non HMO) | Attending: Emergency Medicine | Admitting: Emergency Medicine

## 2013-11-29 ENCOUNTER — Emergency Department (HOSPITAL_COMMUNITY)
Admission: EM | Admit: 2013-11-29 | Discharge: 2013-11-29 | Disposition: A | Discharge status: Home / Self Care | Payer: Managed Care, Other (non HMO) | Attending: Emergency Medicine | Admitting: Emergency Medicine

## 2013-11-29 ENCOUNTER — Emergency Department (HOSPITAL_COMMUNITY): Payer: Managed Care, Other (non HMO)

## 2013-11-29 ENCOUNTER — Encounter (HOSPITAL_COMMUNITY): Payer: Self-pay | Admitting: Emergency Medicine

## 2013-11-29 DIAGNOSIS — S3992XA Unspecified injury of lower back, initial encounter: Secondary | ICD-10-CM | POA: Diagnosis not present

## 2013-11-29 DIAGNOSIS — M1990 Unspecified osteoarthritis, unspecified site: Secondary | ICD-10-CM | POA: Insufficient documentation

## 2013-11-29 DIAGNOSIS — Z794 Long term (current) use of insulin: Secondary | ICD-10-CM | POA: Insufficient documentation

## 2013-11-29 DIAGNOSIS — Z8711 Personal history of peptic ulcer disease: Secondary | ICD-10-CM | POA: Diagnosis not present

## 2013-11-29 DIAGNOSIS — Z8739 Personal history of other diseases of the musculoskeletal system and connective tissue: Secondary | ICD-10-CM | POA: Insufficient documentation

## 2013-11-29 DIAGNOSIS — W19XXXA Unspecified fall, initial encounter: Secondary | ICD-10-CM

## 2013-11-29 DIAGNOSIS — W1789XA Other fall from one level to another, initial encounter: Secondary | ICD-10-CM | POA: Diagnosis not present

## 2013-11-29 DIAGNOSIS — S3991XA Unspecified injury of abdomen, initial encounter: Secondary | ICD-10-CM | POA: Diagnosis not present

## 2013-11-29 DIAGNOSIS — K219 Gastro-esophageal reflux disease without esophagitis: Secondary | ICD-10-CM | POA: Diagnosis not present

## 2013-11-29 DIAGNOSIS — G629 Polyneuropathy, unspecified: Secondary | ICD-10-CM | POA: Insufficient documentation

## 2013-11-29 DIAGNOSIS — Y9289 Other specified places as the place of occurrence of the external cause: Secondary | ICD-10-CM | POA: Insufficient documentation

## 2013-11-29 DIAGNOSIS — G8929 Other chronic pain: Secondary | ICD-10-CM | POA: Insufficient documentation

## 2013-11-29 DIAGNOSIS — Z9889 Other specified postprocedural states: Secondary | ICD-10-CM | POA: Diagnosis not present

## 2013-11-29 DIAGNOSIS — S0990XA Unspecified injury of head, initial encounter: Secondary | ICD-10-CM | POA: Insufficient documentation

## 2013-11-29 DIAGNOSIS — E1065 Type 1 diabetes mellitus with hyperglycemia: Secondary | ICD-10-CM | POA: Diagnosis present

## 2013-11-29 DIAGNOSIS — Z720 Tobacco use: Secondary | ICD-10-CM | POA: Diagnosis not present

## 2013-11-29 DIAGNOSIS — R0602 Shortness of breath: Secondary | ICD-10-CM | POA: Diagnosis not present

## 2013-11-29 DIAGNOSIS — Z79899 Other long term (current) drug therapy: Secondary | ICD-10-CM | POA: Insufficient documentation

## 2013-11-29 DIAGNOSIS — R739 Hyperglycemia, unspecified: Secondary | ICD-10-CM

## 2013-11-29 DIAGNOSIS — E860 Dehydration: Secondary | ICD-10-CM | POA: Diagnosis not present

## 2013-11-29 DIAGNOSIS — R011 Cardiac murmur, unspecified: Secondary | ICD-10-CM | POA: Insufficient documentation

## 2013-11-29 DIAGNOSIS — K3184 Gastroparesis: Secondary | ICD-10-CM | POA: Diagnosis not present

## 2013-11-29 DIAGNOSIS — R109 Unspecified abdominal pain: Secondary | ICD-10-CM | POA: Diagnosis not present

## 2013-11-29 DIAGNOSIS — R112 Nausea with vomiting, unspecified: Secondary | ICD-10-CM | POA: Insufficient documentation

## 2013-11-29 DIAGNOSIS — E1043 Type 1 diabetes mellitus with diabetic autonomic (poly)neuropathy: Secondary | ICD-10-CM | POA: Diagnosis not present

## 2013-11-29 DIAGNOSIS — I10 Essential (primary) hypertension: Secondary | ICD-10-CM | POA: Insufficient documentation

## 2013-11-29 DIAGNOSIS — S199XXA Unspecified injury of neck, initial encounter: Secondary | ICD-10-CM | POA: Diagnosis not present

## 2013-11-29 LAB — CBC WITH DIFFERENTIAL/PLATELET
BASOS ABS: 0.1 10*3/uL (ref 0.0–0.1)
Basophils Relative: 1 % (ref 0–1)
Eosinophils Absolute: 0.2 10*3/uL (ref 0.0–0.7)
Eosinophils Relative: 3 % (ref 0–5)
HCT: 36.6 % — ABNORMAL LOW (ref 39.0–52.0)
HEMOGLOBIN: 12.4 g/dL — AB (ref 13.0–17.0)
Lymphocytes Relative: 21 % (ref 12–46)
Lymphs Abs: 1.7 10*3/uL (ref 0.7–4.0)
MCH: 30 pg (ref 26.0–34.0)
MCHC: 33.9 g/dL (ref 30.0–36.0)
MCV: 88.6 fL (ref 78.0–100.0)
MONOS PCT: 7 % (ref 3–12)
Monocytes Absolute: 0.5 10*3/uL (ref 0.1–1.0)
NEUTROS PCT: 68 % (ref 43–77)
Neutro Abs: 5.3 10*3/uL (ref 1.7–7.7)
Platelets: 277 10*3/uL (ref 150–400)
RBC: 4.13 MIL/uL — ABNORMAL LOW (ref 4.22–5.81)
RDW: 13.7 % (ref 11.5–15.5)
WBC: 7.9 10*3/uL (ref 4.0–10.5)

## 2013-11-29 LAB — BASIC METABOLIC PANEL
Anion gap: 10 (ref 5–15)
BUN: 13 mg/dL (ref 6–23)
CO2: 32 mEq/L (ref 19–32)
CREATININE: 0.64 mg/dL (ref 0.50–1.35)
Calcium: 10 mg/dL (ref 8.4–10.5)
Chloride: 99 mEq/L (ref 96–112)
GFR calc Af Amer: >90 mL/min (ref >90)
GFR calc non Af Amer: >90 mL/min (ref >90)
Glucose, Bld: 95 mg/dL (ref 70–99)
Potassium: 3.6 mEq/L — ABNORMAL LOW (ref 3.7–5.3)
Sodium: 141 mEq/L (ref 137–147)

## 2013-11-29 LAB — CBG MONITORING, ED: Glucose-Capillary: >600 mg/dL (ref 70–99)

## 2013-11-29 MED ORDER — SODIUM CHLORIDE 0.9 % IV BOLUS (SEPSIS)
1000.0000 mL | Freq: Once | INTRAVENOUS | Status: AC
  Administered 2013-11-29: 1000 mL via INTRAVENOUS

## 2013-11-29 MED ORDER — SODIUM CHLORIDE 0.9 % IJ SOLN
INTRAMUSCULAR | Status: AC
  Filled 2013-11-29: qty 250

## 2013-11-29 MED ORDER — IOHEXOL 300 MG/ML  SOLN
100.0000 mL | Freq: Once | INTRAMUSCULAR | Status: AC | PRN
  Administered 2013-11-29: 100 mL via INTRAVENOUS

## 2013-11-29 MED ORDER — SODIUM CHLORIDE 0.9 % IV SOLN: INTRAVENOUS | Status: DC

## 2013-11-29 MED ORDER — HYDROMORPHONE HCL 1 MG/ML IJ SOLN
1.0000 mg | Freq: Once | INTRAMUSCULAR | Status: AC
  Administered 2013-11-29: 1 mg via INTRAVENOUS
  Filled 2013-11-29: qty 1

## 2013-11-29 MED ORDER — SODIUM CHLORIDE 0.9 % IJ SOLN
INTRAMUSCULAR | Status: AC
  Filled 2013-11-29: qty 30

## 2013-11-29 MED ORDER — ONDANSETRON HCL 4 MG/2ML IJ SOLN
4.0000 mg | Freq: Once | INTRAMUSCULAR | Status: AC
  Administered 2013-11-29: 4 mg via INTRAVENOUS
  Filled 2013-11-29: qty 2

## 2013-11-29 NOTE — ED Notes (Signed)
Pt states he has severe abdominal pain, nausea and vomiting.

## 2013-11-29 NOTE — ED Notes (Signed)
Pt states he was chasing after his dogs and fell over a 12 foot railing and landed on his back. Pt states he has pain from below his neck down to lower back.

## 2013-11-29 NOTE — ED Notes (Signed)
Pt c/o pain, EDP aware. No new order given for pain.

## 2013-11-29 NOTE — ED Provider Notes (Signed)
CSN: [id number]     Arrival date & time 11/29/13  [id number] History  This chart was scribed for Fredia Sorrow, MD by Marlowe Kays, ED Scribe. This patient was seen in room APA12/APA12 and the patient's care was started at 7:27 AM.  No chief complaint on file.  Patient is a 28 y.o. male presenting with back pain and fall. The history is provided by the patient. No language interpreter was used.  Back Pain Location:  Thoracic spine and lumbar spine Quality:  Unable to specify Radiates to:  Does not radiate Pain severity:  Severe Onset quality:  Sudden Timing:  Constant Chronicity:  New Context: falling   Associated symptoms: abdominal pain and headaches   Associated symptoms: no chest pain, no dysuria and no fever   Fall This is a new problem. The current episode started 3 to 5 hours ago. The problem has not changed since onset.Associated symptoms include abdominal pain, headaches and shortness of breath. Pertinent negatives include no chest pain. The symptoms are aggravated by twisting, bending, walking, exertion and standing. Nothing relieves the symptoms. He has tried nothing for the symptoms.   HPI Comments:  Brent Daniels is a 28 y.o. male with PMHx of gastroparesis, DM type I, chronic back pain, and HTN who presents to the Emergency Department complaining of a fall that occurred PTA. Pt states he was chasing after a dog and fell approximately 12 feet landing on his back. He reports severe neck and back pain. He endorses SOB, HA, abdominal pain, nausea, vomiting and HA. He denies leg pain, CP, LOC, head injury, diarrhea, sore throat, rhinorrhea, fever or chills. Pt falls asleep several time during exam and is difficult to arouse to answer questions.  PCP- Dr. Neta Mends  Past Medical History  Diagnosis Date  . Diabetes mellitus     type 1  . Arthritis   . Back pain   . GERD (gastroesophageal reflux disease)   . PUD (peptic ulcer disease)   . Hypertension   . Heart murmur      valvular stenosis  . Neuropathy   . Diabetic gastroparesis associated with type 1 diabetes mellitus   . S/P arthroscopic knee surgery 07/04/2011  . Scoliosis 07/11/2012   Past Surgical History  Procedure Laterality Date  . Tympanostomy tube placement    . Knee arthroscopy  06/30/2011    Procedure: ARTHROSCOPY KNEE;  Surgeon: Carole Civil, MD;  Location: [facility];  Service: Orthopedics;  Laterality: Right;  diagnostic arthroscopy  . Esophagogastroduodenoscopy (egd) with propofol  06/17/2013    Dr. Oneida Alar: two gastric ulcers in fundus, mild antral gastritis, candida esophagitis  . Esophageal biopsy  06/17/2013    Procedure: GASTRIC ULCER AND ANTRAL BIOPSIES; ESOPHAGEAL BRUSHING;  Surgeon: Danie Binder, MD;  Location: [facility];  Service: Endoscopy;;  . Esophagogastroduodenoscopy (egd) with propofol N/A 09/11/2013    Dr. [name]: abnormal hypopharynx, question massively enlarged tonsils, stomach full of food precluded exam, needs EGD for verification of ulcer healing at a later date   Family History  Problem Relation Age of Onset  . Cancer Father   . Alcohol abuse Father   . Pseudochol deficiency Neg Hx   . Malignant hyperthermia Neg Hx   . Hypotension Neg Hx   . Anesthesia problems Neg Hx   . Colon cancer Neg Hx    History  Substance Use Topics  . Smoking status: Current Some Day Smoker -- 0.50 packs/day for 10 years    Types: Cigarettes  Last Attempt to Quit: 10/07/2013  . Smokeless tobacco: Former Systems developer    Quit date: 02/17/2013     Comment: smokes 4 cigarettes a day   . Alcohol Use: No    Review of Systems  Constitutional: Negative for fever and chills.  HENT: Negative for rhinorrhea and sore throat.   Eyes: Negative for visual disturbance.  Respiratory: Positive for shortness of breath. Negative for cough.   Cardiovascular: Negative for chest pain and leg swelling.  Gastrointestinal: Positive for nausea, vomiting and abdominal pain. Negative for diarrhea.  Genitourinary:  Negative for dysuria.  Musculoskeletal: Positive for back pain and neck pain.  Skin: Negative for rash.  Neurological: Positive for headaches. Negative for syncope.  Hematological: Does not bruise/bleed easily.  Psychiatric/Behavioral: Negative for confusion.    Allergies  Hydrocodone; Ibuprofen; Naproxen; Sulfa antibiotics; Tramadol; and Morphine and related  Home Medications   Prior to Admission medications   Medication Sig Start Date End Date Taking? Authorizing Provider  bismuth subsalicylate (PEPTO BISMOL) 262 MG/15ML suspension Take 30 mLs by mouth every 6 (six) hours as needed for indigestion or diarrhea or loose stools.    Historical Provider, MD  gabapentin (NEURONTIN) 800 MG tablet Take 800 mg by mouth 3 (three) times daily.    Historical Provider, MD  insulin NPH-regular Human (NOVOLIN 70/30) (70-30) 100 UNIT/ML injection Inject 20 Units into the skin 2 (two) times daily with a meal. 15 units in the morning and 25 at night. 11/17/13   Kathie Dike, MD  insulin regular (NOVOLIN R,HUMULIN R) 100 units/mL injection Inject 2-10 Units into the skin 3 (three) times daily before meals. Sliding scale.    Historical Provider, MD  lisinopril (PRINIVIL,ZESTRIL) 20 MG tablet Take 20 mg by mouth daily.    Historical Provider, MD  metoCLOPramide (REGLAN) 10 MG tablet Take 1 tablet (10 mg total) by mouth every 6 (six) hours. 10/14/13   Dorie Rank, MD  oxyCODONE-acetaminophen (PERCOCET) 5-325 MG per tablet Take 30 tablets by mouth every 4 (four) hours as needed. 11/17/13   Kathie Dike, MD  pantoprazole (PROTONIX) 40 MG tablet Take 1 tablet (40 mg total) by mouth 2 (two) times daily before a meal. 10/01/13   Kathie Dike, MD  pravastatin (PRAVACHOL) 20 MG tablet Take 20 mg by mouth daily.  09/10/13 09/10/14  Historical Provider, MD  promethazine (PHENERGAN) 25 MG tablet Take 1 tablet (25 mg total) by mouth every 6 (six) hours as needed. 11/11/13   Nat Christen, MD  tiZANidine (ZANAFLEX) 4 MG tablet  Take 1 tablet by mouth every 6 (six) hours as needed for muscle spasms.  07/30/13   Historical Provider, MD   Triage Vitals: BP 175/109  Pulse 122  Temp(Src) 98 F (36.7 C) (Oral)  Resp 28  Ht 6\' 1"  (1.854 m)  Wt 185 lb (83.915 kg)  BMI 24.41 kg/m2  SpO2 100% Physical Exam  Nursing note and vitals reviewed. Constitutional: He is oriented to person, place, and time. He appears well-developed and well-nourished.  HENT:  Head: Normocephalic and atraumatic.  Mouth/Throat: Mucous membranes are normal.  Eyes: EOM are normal.  Neck: Normal range of motion.  Cardiovascular: Normal rate, regular rhythm and normal heart sounds.  Exam reveals no gallop and no friction rub.   No murmur heard. No leg swelling.  Pulmonary/Chest: Effort normal and breath sounds normal. No respiratory distress. He has no wheezes. He has no rales.  Abdominal: Bowel sounds are normal. There is tenderness (lower quadrants). There is guarding.  Musculoskeletal: Normal  range of motion. He exhibits tenderness.  Lumbar back area.  Neurological: He is alert and oriented to person, place, and time. No cranial nerve deficit. He exhibits normal muscle tone. Coordination normal.  Skin: Skin is warm and dry.  Psychiatric: He has a normal mood and affect. His behavior is normal.    ED Course  Procedures (including critical care time) DIAGNOSTIC STUDIES: Oxygen Saturation is 100% on RA, normal by my interpretation.   COORDINATION OF CARE: 7:34 AM- Will order imaging. Pt verbalizes understanding and agrees to plan.  Medications  0.9 %  sodium chloride infusion (not administered)  sodium chloride 0.9 % injection (not administered)  sodium chloride 0.9 % injection (not administered)  sodium chloride 0.9 % bolus 1,000 mL (0 mLs Intravenous Stopped 11/29/13 0917)  ondansetron (ZOFRAN) injection 4 mg (4 mg Intravenous Given 11/29/13 0812)  HYDROmorphone (DILAUDID) injection 1 mg (1 mg Intravenous Given 11/29/13 0812)   iohexol (OMNIPAQUE) 300 MG/ML solution 100 mL (100 mLs Intravenous Contrast Given 11/29/13 0845)    Labs Review Labs Reviewed  BASIC METABOLIC PANEL - Abnormal; Notable for the following:    Potassium 3.6 (*)    All other components within normal limits  CBC WITH DIFFERENTIAL - Abnormal; Notable for the following:    RBC 4.13 (*)    Hemoglobin 12.4 (*)    HCT 36.6 (*)    All other components within normal limits   Results for orders placed during the hospital encounter of [id number]  BASIC METABOLIC PANEL      Result Value Ref Range   Sodium 141  137 - 147 mEq/L   Potassium 3.6 (*) 3.7 - 5.3 mEq/L   Chloride 99  96 - 112 mEq/L   CO2 32  19 - 32 mEq/L   Glucose, Bld 95  70 - 99 mg/dL   BUN 13  6 - 23 mg/dL   Creatinine, Ser 0.64  0.50 - 1.35 mg/dL   Calcium 10.0  8.4 - 10.5 mg/dL   GFR calc non Af Amer >90  >90 mL/min   GFR calc Af Amer >90  >90 mL/min   Anion gap 10  5 - 15  CBC WITH DIFFERENTIAL      Result Value Ref Range   WBC 7.9  4.0 - 10.5 K/uL   RBC 4.13 (*) 4.22 - 5.81 MIL/uL   Hemoglobin 12.4 (*) 13.0 - 17.0 g/dL   HCT 36.6 (*) 39.0 - 52.0 %   MCV 88.6  78.0 - 100.0 fL   MCH 30.0  26.0 - 34.0 pg   MCHC 33.9  30.0 - 36.0 g/dL   RDW 13.7  11.5 - 15.5 %   Platelets 277  150 - 400 K/uL   Neutrophils Relative % 68  43 - 77 %   Neutro Abs 5.3  1.7 - 7.7 K/uL   Lymphocytes Relative 21  12 - 46 %   Lymphs Abs 1.7  0.7 - 4.0 K/uL   Monocytes Relative 7  3 - 12 %   Monocytes Absolute 0.5  0.1 - 1.0 K/uL   Eosinophils Relative 3  0 - 5 %   Eosinophils Absolute 0.2  0.0 - 0.7 K/uL   Basophils Relative 1  0 - 1 %   Basophils Absolute 0.1  0.0 - 0.1 K/uL     Imaging Review Ct Head Wo Contrast  11/29/2013   CLINICAL DATA:  Patient fell approximate 12 feet onto his back this morning. Fell onto back and posterior head  complaining of head and neck pain.  EXAM: CT HEAD WITHOUT CONTRAST  CT CERVICAL SPINE WITHOUT CONTRAST  TECHNIQUE: Multidetector CT imaging of the head  and cervical spine was performed following the standard protocol without intravenous contrast. Multiplanar CT image reconstructions of the cervical spine were also generated.  COMPARISON:  None.  FINDINGS: CT HEAD FINDINGS  Ventricles are normal in configuration. There is mild ventricular and sulcal enlargement reflecting volume loss greater than generally seen in a patient of this age.  No parenchymal masses or mass effect. There is no evidence of an infarct. There are no extra-axial masses or abnormal fluid collections.  No intracranial hemorrhage.  No skull fracture.  There is mild ethmoid sinus mucosal thickening.  CT CERVICAL SPINE FINDINGS  No fracture. No spondylolisthesis. There are no significant degenerative changes. Soft tissues are unremarkable. Lung apices are clear.  IMPRESSION: HEAD CT: No acute intracranial abnormalities. Volume loss greater than expected for this patient's age.  CERVICAL CT:  Normal.   Electronically Signed   By: Lajean Manes M.D.   On: 11/29/2013 09:14   Ct Chest W Contrast  11/29/2013   CLINICAL DATA:  Patient fell approximate 12 feet off his porch onto his back this morning. Complaining of back pain. History of hypertension.  EXAM: CT CHEST, ABDOMEN, AND PELVIS WITH CONTRAST  TECHNIQUE: Multidetector CT imaging of the chest, abdomen and pelvis was performed following the standard protocol during bolus administration of intravenous contrast.  CONTRAST:  182mL OMNIPAQUE IOHEXOL 300 MG/ML  SOLN  COMPARISON:  None.  FINDINGS: CT CHEST FINDINGS  Thoracic inlet is unremarkable. Cardiac silhouette is normal in size and configuration. Great vessels are normal in caliber. No mediastinal hematoma. No mediastinal or hilar masses or adenopathy. Clear lungs. No pleural effusion or pneumothorax  CT ABDOMEN AND PELVIS FINDINGS  Liver is mildly enlarged measuring 25 cm in greatest dimension. No liver mass or focal lesion. No liver contusion or laceration.  Normal spleen, gallbladder,  pancreas, adrenal glands, kidneys, ureters and bladder.  No adenopathy.  No abnormal fluid collections.  No evidence of a bowel wall hematoma or mesenteric hematoma. Colon and small bowel are unremarkable.  MUSCULOSKELETAL:  No fractures.  IMPRESSION: 1. No acute injury to the chest, abdomen or pelvis. 2. Mild hepatomegaly of unclear etiology. 3. No other abnormalities.   Electronically Signed   By: Lajean Manes M.D.   On: 11/29/2013 09:21   Ct Cervical Spine Wo Contrast  11/29/2013   CLINICAL DATA:  Patient fell approximate 12 feet onto his back this morning. Fell onto back and posterior head complaining of head and neck pain.  EXAM: CT HEAD WITHOUT CONTRAST  CT CERVICAL SPINE WITHOUT CONTRAST  TECHNIQUE: Multidetector CT imaging of the head and cervical spine was performed following the standard protocol without intravenous contrast. Multiplanar CT image reconstructions of the cervical spine were also generated.  COMPARISON:  None.  FINDINGS: CT HEAD FINDINGS  Ventricles are normal in configuration. There is mild ventricular and sulcal enlargement reflecting volume loss greater than generally seen in a patient of this age.  No parenchymal masses or mass effect. There is no evidence of an infarct. There are no extra-axial masses or abnormal fluid collections.  No intracranial hemorrhage.  No skull fracture.  There is mild ethmoid sinus mucosal thickening.  CT CERVICAL SPINE FINDINGS  No fracture. No spondylolisthesis. There are no significant degenerative changes. Soft tissues are unremarkable. Lung apices are clear.  IMPRESSION: HEAD CT: No acute intracranial abnormalities. Volume  loss greater than expected for this patient's age.  CERVICAL CT:  Normal.   Electronically Signed   By: Lajean Manes M.D.   On: 11/29/2013 09:14   Ct Abdomen Pelvis W Contrast  11/29/2013   CLINICAL DATA:  Patient fell approximate 12 feet off his porch onto his back this morning. Complaining of back pain. History of  hypertension.  EXAM: CT CHEST, ABDOMEN, AND PELVIS WITH CONTRAST  TECHNIQUE: Multidetector CT imaging of the chest, abdomen and pelvis was performed following the standard protocol during bolus administration of intravenous contrast.  CONTRAST:  13mL OMNIPAQUE IOHEXOL 300 MG/ML  SOLN  COMPARISON:  None.  FINDINGS: CT CHEST FINDINGS  Thoracic inlet is unremarkable. Cardiac silhouette is normal in size and configuration. Great vessels are normal in caliber. No mediastinal hematoma. No mediastinal or hilar masses or adenopathy. Clear lungs. No pleural effusion or pneumothorax  CT ABDOMEN AND PELVIS FINDINGS  Liver is mildly enlarged measuring 25 cm in greatest dimension. No liver mass or focal lesion. No liver contusion or laceration.  Normal spleen, gallbladder, pancreas, adrenal glands, kidneys, ureters and bladder.  No adenopathy.  No abnormal fluid collections.  No evidence of a bowel wall hematoma or mesenteric hematoma. Colon and small bowel are unremarkable.  MUSCULOSKELETAL:  No fractures.  IMPRESSION: 1. No acute injury to the chest, abdomen or pelvis. 2. Mild hepatomegaly of unclear etiology. 3. No other abnormalities.   Electronically Signed   By: Lajean Manes M.D.   On: 11/29/2013 09:21     EKG Interpretation None      MDM   Final diagnoses:  Fall    Patient with history of fall workup head CT head neck chest abdomen and pelvis without any significant findings. That includes the bony part of the neck and the back. Patient treated with pain medication here. Patient has a history of chronic pain and gastroparesis issues. Patient will go home and rest. Patient will not be given a prescription of narcotics. Patient nontoxic no acute distress.  I personally performed the services described in this documentation, which was scribed in my presence. The recorded information has been reviewed and is accurate.    Fredia Sorrow, MD 11/29/13 1008

## 2013-11-29 NOTE — Discharge Instructions (Signed)
Workup for the fall without any acute findings. Expect to be sore and stiff for a couple days. Rest for the next 2 days.

## 2013-11-29 NOTE — ED Notes (Signed)
Pt cursing and upset he is being discharged without pain prescription, EDP at bedside explaining results and reason for no prescriptions.

## 2013-11-30 LAB — CBC WITH DIFFERENTIAL/PLATELET
Basophils Absolute: 0.1 10*3/uL (ref 0.0–0.1)
Basophils Relative: 1 % (ref 0–1)
Eosinophils Absolute: 0.2 10*3/uL (ref 0.0–0.7)
Eosinophils Relative: 4 % (ref 0–5)
HCT: 33.7 % — ABNORMAL LOW (ref 39.0–52.0)
Hemoglobin: 11.3 g/dL — ABNORMAL LOW (ref 13.0–17.0)
LYMPHS ABS: 1.7 10*3/uL (ref 0.7–4.0)
LYMPHS PCT: 29 % (ref 12–46)
MCH: 30.5 pg (ref 26.0–34.0)
MCHC: 33.5 g/dL (ref 30.0–36.0)
MCV: 91.1 fL (ref 78.0–100.0)
Monocytes Absolute: 0.4 10*3/uL (ref 0.1–1.0)
Monocytes Relative: 7 % (ref 3–12)
NEUTROS PCT: 59 % (ref 43–77)
Neutro Abs: 3.4 10*3/uL (ref 1.7–7.7)
PLATELETS: 247 10*3/uL (ref 150–400)
RBC: 3.7 MIL/uL — AB (ref 4.22–5.81)
RDW: 14 % (ref 11.5–15.5)
WBC: 5.8 10*3/uL (ref 4.0–10.5)

## 2013-11-30 LAB — BASIC METABOLIC PANEL
Anion gap: 16 — ABNORMAL HIGH (ref 5–15)
BUN: 13 mg/dL (ref 6–23)
CO2: 24 mEq/L (ref 19–32)
Calcium: 9.4 mg/dL (ref 8.4–10.5)
Chloride: 87 mEq/L — ABNORMAL LOW (ref 96–112)
Creatinine, Ser: 0.87 mg/dL (ref 0.50–1.35)
Glucose, Bld: 569 mg/dL (ref 70–99)
POTASSIUM: 4.2 meq/L (ref 3.7–5.3)
SODIUM: 127 meq/L — AB (ref 137–147)

## 2013-11-30 LAB — CBG MONITORING, ED: GLUCOSE-CAPILLARY: 249 mg/dL — AB (ref 70–99)

## 2013-11-30 MED ORDER — SODIUM CHLORIDE 0.9 % IV BOLUS (SEPSIS)
1000.0000 mL | Freq: Once | INTRAVENOUS | Status: AC
  Administered 2013-11-30: 1000 mL via INTRAVENOUS

## 2013-11-30 MED ORDER — SODIUM CHLORIDE 0.9 % IV BOLUS (SEPSIS): 1000.0000 mL | Freq: Once | INTRAVENOUS | Status: DC

## 2013-11-30 MED ORDER — METOCLOPRAMIDE HCL 5 MG/ML IJ SOLN
10.0000 mg | Freq: Once | INTRAMUSCULAR | Status: AC
  Administered 2013-11-30: 10 mg via INTRAVENOUS
  Filled 2013-11-30: qty 2

## 2013-11-30 MED ORDER — SODIUM CHLORIDE 0.9 % IV SOLN
INTRAVENOUS | Status: DC
  Filled 2013-11-30: qty 2.5

## 2013-11-30 NOTE — ED Notes (Signed)
Security called to pt's room by charge nurse for pt's disruptive behavior towards his mother. Dr [name] in room, pt refusing further care. Cursive towards staff. Pt pulled off EKG leads, did allow me to d/c his IV but then promptly walked out of ED, leaving his mother behind. I asked mom if she felt safe going home with him and she felt she was OK. Mom visibly shaken by this episode, apologizing for him and states, "he keeps exhibiting this addictive behavior and I just don't know what to do" I advised her to first and foremost keep herself safe and to call 911 if she ever feels threatened. She was escorted to her car by security and Teaching laboratory technician who was in the department.

## 2013-11-30 NOTE — ED Notes (Signed)
CRITICAL VALUE ALERT  Critical value received:  Glucose 569  Date of notification:  11/30/13  Time of notification:  0057  Critical value read back: Yes  Nurse who received alert:  Elwanda Brooklyn, RN  MD notified:  Dr. [name]  Time of notification:  [phone]

## 2013-11-30 NOTE — ED Provider Notes (Signed)
CSN: [id number]     Arrival date & time 11/29/13  2320 History  This chart was scribed for Brent Cable, MD by Jeanell Sparrow, ED Scribe. This patient was seen in room APA04/[facility]04 and the patient's care was started at 12:23 AM.   Chief Complaint  Patient presents with  . Hyperglycemia   Patient is a 28 y.o. male presenting with hyperglycemia. The history is provided by the patient. No language interpreter was used.  Hyperglycemia Severity:  Moderate Onset quality:  Sudden Duration:  1 day Timing:  Sporadic Progression:  Unchanged Associated symptoms: abdominal pain, nausea and vomiting   Associated symptoms: no fever    HPI Comments: Brent Daniels is a 28 y.o. male with a hx of type DM who presents to the Emergency Department complaining of constant moderate diffuse abdominal pain that started today. He reports that his pain is due to hyperglycemia. He reports that the pain radiates to his chest and back. He states that he has associated nausea and vomiting. He denies any diarrhea, blood in stools or emesis, fevers, cough, weakness, or numbness.   Past Medical History  Diagnosis Date  . Diabetes mellitus     type 1  . Arthritis   . Back pain   . GERD (gastroesophageal reflux disease)   . PUD (peptic ulcer disease)   . Hypertension   . Heart murmur     valvular stenosis  . Neuropathy   . Diabetic gastroparesis associated with type 1 diabetes mellitus   . S/P arthroscopic knee surgery 07/04/2011  . Scoliosis 07/11/2012   Past Surgical History  Procedure Laterality Date  . Tympanostomy tube placement    . Knee arthroscopy  06/30/2011    Procedure: ARTHROSCOPY KNEE;  Surgeon: Carole Civil, MD;  Location: [facility];  Service: Orthopedics;  Laterality: Right;  diagnostic arthroscopy  . Esophagogastroduodenoscopy (egd) with propofol  06/17/2013    Dr. Oneida Alar: two gastric ulcers in fundus, mild antral gastritis, candida esophagitis  . Esophageal biopsy  06/17/2013    Procedure:  GASTRIC ULCER AND ANTRAL BIOPSIES; ESOPHAGEAL BRUSHING;  Surgeon: Danie Binder, MD;  Location: [facility];  Service: Endoscopy;;  . Esophagogastroduodenoscopy (egd) with propofol N/A 09/11/2013    Dr. [name]: abnormal hypopharynx, question massively enlarged tonsils, stomach full of food precluded exam, needs EGD for verification of ulcer healing at a later date   Family History  Problem Relation Age of Onset  . Cancer Father   . Alcohol abuse Father   . Pseudochol deficiency Neg Hx   . Malignant hyperthermia Neg Hx   . Hypotension Neg Hx   . Anesthesia problems Neg Hx   . Colon cancer Neg Hx    History  Substance Use Topics  . Smoking status: Current Some Day Smoker -- 0.50 packs/day for 10 years    Types: Cigarettes    Last Attempt to Quit: 10/07/2013  . Smokeless tobacco: Former Systems developer    Quit date: 02/17/2013     Comment: smokes 4 cigarettes a day   . Alcohol Use: No    Review of Systems  Constitutional: Negative for fever.  Respiratory: Negative for cough.   Gastrointestinal: Positive for nausea, vomiting and abdominal pain. Negative for diarrhea.  Neurological: Negative for weakness and numbness.  All other systems reviewed and are negative.   Allergies  Hydrocodone; Ibuprofen; Naproxen; Sulfa antibiotics; Tramadol; and Morphine and related  Home Medications   Prior to Admission medications   Medication Sig Start Date End Date Taking?  Authorizing Provider  bismuth subsalicylate (PEPTO BISMOL) 262 MG/15ML suspension Take 30 mLs by mouth every 6 (six) hours as needed for indigestion or diarrhea or loose stools.    Historical Provider, MD  gabapentin (NEURONTIN) 800 MG tablet Take 800 mg by mouth 3 (three) times daily.    Historical Provider, MD  insulin NPH-regular Human (NOVOLIN 70/30) (70-30) 100 UNIT/ML injection Inject 20 Units into the skin 2 (two) times daily with a meal. 15 units in the morning and 25 at night. 11/17/13   Kathie Dike, MD  insulin regular (NOVOLIN  R,HUMULIN R) 100 units/mL injection Inject 2-10 Units into the skin 3 (three) times daily before meals. Sliding scale.    Historical Provider, MD  lisinopril (PRINIVIL,ZESTRIL) 20 MG tablet Take 20 mg by mouth daily.    Historical Provider, MD  pantoprazole (PROTONIX) 40 MG tablet Take 1 tablet (40 mg total) by mouth 2 (two) times daily before a meal. 10/01/13   Kathie Dike, MD  pravastatin (PRAVACHOL) 20 MG tablet Take 20 mg by mouth daily.  09/10/13 09/10/14  Historical Provider, MD  tiZANidine (ZANAFLEX) 4 MG tablet Take 1 tablet by mouth every 6 (six) hours as needed for muscle spasms.  07/30/13   Historical Provider, MD   BP 141/99  Pulse 112  Temp(Src) 98.1 F (36.7 C)  Resp 20  Ht 6\' 1"  (1.854 m)  Wt 185 lb (83.915 kg)  BMI 24.41 kg/m2  SpO2 100% Physical Exam CONSTITUTIONAL: Well developed/well nourished HEAD: Normocephalic/atraumatic EYES: EOMI/PERRL ENMT: Mucous membranes moist NECK: supple no meningeal signs SPINE:entire spine nontender CV: S1/S2 noted, no murmurs/rubs/gallops noted LUNGS: Lungs are clear to auscultation bilaterally, no apparent distress ABDOMEN: soft, mild diffuse TTP, no rebound or guarding GU:no cva tenderness NEURO: Pt is awake/alert, moves all extremitiesx4 EXTREMITIES: pulses normal, full ROM SKIN: warm, color normal PSYCH: no abnormalities of mood noted  ED Course  Procedures  DIAGNOSTIC STUDIES: Oxygen Saturation is 100% on RA, normal by my interpretation.    COORDINATION OF CARE: 12:29 AM- Pt advised of plan for treatment which includes medication and labs and pt agrees. Hyperglycemia improved with IV fluids He is dehydrated by no signs of DKA He is in no distress He just had CT imaging of abd/pelvis on 10/24 that was negative I don't feel further workup indicated Pt became abusive towards staff when he was not given any IV narcotics He was given reglan for known h/o gastroparesis Pt ambulatory, no distress, discharged home with  mother Labs Review Labs Reviewed  BASIC METABOLIC PANEL - Abnormal; Notable for the following:    Sodium 127 (*)    Chloride 87 (*)    Glucose, Bld 569 (*)    Anion gap 16 (*)    All other components within normal limits  CBC WITH DIFFERENTIAL - Abnormal; Notable for the following:    RBC 3.70 (*)    Hemoglobin 11.3 (*)    HCT 33.7 (*)    All other components within normal limits  CBG MONITORING, ED - Abnormal; Notable for the following:    Glucose-Capillary >600 (*)    All other components within normal limits  CBG MONITORING, ED - Abnormal; Notable for the following:    Glucose-Capillary 249 (*)    All other components within normal limits    Imaging Review Ct Head Wo Contrast  11/29/2013   CLINICAL DATA:  Patient fell approximate 12 feet onto his back this morning. Fell onto back and posterior head complaining of head and neck  pain.  EXAM: CT HEAD WITHOUT CONTRAST  CT CERVICAL SPINE WITHOUT CONTRAST  TECHNIQUE: Multidetector CT imaging of the head and cervical spine was performed following the standard protocol without intravenous contrast. Multiplanar CT image reconstructions of the cervical spine were also generated.  COMPARISON:  None.  FINDINGS: CT HEAD FINDINGS  Ventricles are normal in configuration. There is mild ventricular and sulcal enlargement reflecting volume loss greater than generally seen in a patient of this age.  No parenchymal masses or mass effect. There is no evidence of an infarct. There are no extra-axial masses or abnormal fluid collections.  No intracranial hemorrhage.  No skull fracture.  There is mild ethmoid sinus mucosal thickening.  CT CERVICAL SPINE FINDINGS  No fracture. No spondylolisthesis. There are no significant degenerative changes. Soft tissues are unremarkable. Lung apices are clear.  IMPRESSION: HEAD CT: No acute intracranial abnormalities. Volume loss greater than expected for this patient's age.  CERVICAL CT:  Normal.   Electronically Signed    By: Lajean Manes M.D.   On: 11/29/2013 09:14   Ct Chest W Contrast  11/29/2013   CLINICAL DATA:  Patient fell approximate 12 feet off his porch onto his back this morning. Complaining of back pain. History of hypertension.  EXAM: CT CHEST, ABDOMEN, AND PELVIS WITH CONTRAST  TECHNIQUE: Multidetector CT imaging of the chest, abdomen and pelvis was performed following the standard protocol during bolus administration of intravenous contrast.  CONTRAST:  121mL OMNIPAQUE IOHEXOL 300 MG/ML  SOLN  COMPARISON:  None.  FINDINGS: CT CHEST FINDINGS  Thoracic inlet is unremarkable. Cardiac silhouette is normal in size and configuration. Great vessels are normal in caliber. No mediastinal hematoma. No mediastinal or hilar masses or adenopathy. Clear lungs. No pleural effusion or pneumothorax  CT ABDOMEN AND PELVIS FINDINGS  Liver is mildly enlarged measuring 25 cm in greatest dimension. No liver mass or focal lesion. No liver contusion or laceration.  Normal spleen, gallbladder, pancreas, adrenal glands, kidneys, ureters and bladder.  No adenopathy.  No abnormal fluid collections.  No evidence of a bowel wall hematoma or mesenteric hematoma. Colon and small bowel are unremarkable.  MUSCULOSKELETAL:  No fractures.  IMPRESSION: 1. No acute injury to the chest, abdomen or pelvis. 2. Mild hepatomegaly of unclear etiology. 3. No other abnormalities.   Electronically Signed   By: Lajean Manes M.D.   On: 11/29/2013 09:21   Ct Cervical Spine Wo Contrast  11/29/2013   CLINICAL DATA:  Patient fell approximate 12 feet onto his back this morning. Fell onto back and posterior head complaining of head and neck pain.  EXAM: CT HEAD WITHOUT CONTRAST  CT CERVICAL SPINE WITHOUT CONTRAST  TECHNIQUE: Multidetector CT imaging of the head and cervical spine was performed following the standard protocol without intravenous contrast. Multiplanar CT image reconstructions of the cervical spine were also generated.  COMPARISON:  None.  FINDINGS:  CT HEAD FINDINGS  Ventricles are normal in configuration. There is mild ventricular and sulcal enlargement reflecting volume loss greater than generally seen in a patient of this age.  No parenchymal masses or mass effect. There is no evidence of an infarct. There are no extra-axial masses or abnormal fluid collections.  No intracranial hemorrhage.  No skull fracture.  There is mild ethmoid sinus mucosal thickening.  CT CERVICAL SPINE FINDINGS  No fracture. No spondylolisthesis. There are no significant degenerative changes. Soft tissues are unremarkable. Lung apices are clear.  IMPRESSION: HEAD CT: No acute intracranial abnormalities. Volume loss greater than expected for  this patient's age.  CERVICAL CT:  Normal.   Electronically Signed   By: Lajean Manes M.D.   On: 11/29/2013 09:14   Ct Abdomen Pelvis W Contrast  11/29/2013   CLINICAL DATA:  Patient fell approximate 12 feet off his porch onto his back this morning. Complaining of back pain. History of hypertension.  EXAM: CT CHEST, ABDOMEN, AND PELVIS WITH CONTRAST  TECHNIQUE: Multidetector CT imaging of the chest, abdomen and pelvis was performed following the standard protocol during bolus administration of intravenous contrast.  CONTRAST:  181mL OMNIPAQUE IOHEXOL 300 MG/ML  SOLN  COMPARISON:  None.  FINDINGS: CT CHEST FINDINGS  Thoracic inlet is unremarkable. Cardiac silhouette is normal in size and configuration. Great vessels are normal in caliber. No mediastinal hematoma. No mediastinal or hilar masses or adenopathy. Clear lungs. No pleural effusion or pneumothorax  CT ABDOMEN AND PELVIS FINDINGS  Liver is mildly enlarged measuring 25 cm in greatest dimension. No liver mass or focal lesion. No liver contusion or laceration.  Normal spleen, gallbladder, pancreas, adrenal glands, kidneys, ureters and bladder.  No adenopathy.  No abnormal fluid collections.  No evidence of a bowel wall hematoma or mesenteric hematoma. Colon and small bowel are  unremarkable.  MUSCULOSKELETAL:  No fractures.  IMPRESSION: 1. No acute injury to the chest, abdomen or pelvis. 2. Mild hepatomegaly of unclear etiology. 3. No other abnormalities.   Electronically Signed   By: Lajean Manes M.D.   On: 11/29/2013 09:21     EKG Interpretation   Date/Time:  Sunday November 30 2013 00:33:32 EDT Ventricular Rate:  101 PR Interval:  142 QRS Duration: 110 QT Interval:  353 QTC Calculation: 457 R Axis:   51 Text Interpretation:  Sinus tachycardia RSR' in V1 or V2, right VCD or RVH  Non-specific ST-t changes No significant change since last tracing  Confirmed by [name]  MD, [place] ([id number]) on 11/30/2013 1:26:59 AM      MDM   Final diagnoses:  Chronic abdominal pain  Hyperglycemia  Dehydration    Nursing notes including past medical history and social history reviewed and considered in documentation Previous records reviewed and considered Labs/vital reviewed and considered   I personally performed the services described in this documentation, which was scribed in my presence. The recorded information has been reviewed and is accurate.       Brent Cable, MD 11/30/13 [phone]

## 2013-11-30 NOTE — ED Notes (Signed)
Sleeping until I disturbed him with IV change. Needs to go to bathroom, offered urinal, states he needs to have BM and wants to go to the bathroom. surly, argumentative with staff and his mom.  Refused help getting off stretcher.cursive towards staff for not giving him pain medications.

## 2013-11-30 NOTE — ED Notes (Signed)
Arguing with his mom, states "reglan never does anything" did not refuse the reglan. "what am I supposed to do when I'm still in excruciating pain in 45 min" I told him we would reassess and talk with the physician as needed. Pt very sleepy, head bobbing on stretcher, fighting sleep. tried to reassure him, turned down the lights, TV off.

## 2013-11-30 NOTE — Discharge Instructions (Signed)

## 2013-12-03 NOTE — Progress Notes (Signed)
Quick Note:  [place] with small amount of gallbladder sludge.  Let's proceed with HIDA.   ______

## 2013-12-05 ENCOUNTER — Other Ambulatory Visit: Payer: Self-pay

## 2013-12-05 DIAGNOSIS — R109 Unspecified abdominal pain: Secondary | ICD-10-CM

## 2013-12-09 ENCOUNTER — Encounter: Payer: Self-pay | Admitting: Gastroenterology

## 2013-12-09 ENCOUNTER — Encounter (HOSPITAL_COMMUNITY): Payer: Self-pay

## 2013-12-09 ENCOUNTER — Encounter (HOSPITAL_COMMUNITY)
Admission: RE | Admit: 2013-12-09 | Discharge: 2013-12-09 | Disposition: A | Discharge status: Home / Self Care | Payer: Managed Care, Other (non HMO) | Source: Ambulatory Visit | Attending: Diagnostic Radiology | Admitting: Diagnostic Radiology

## 2013-12-09 ENCOUNTER — Other Ambulatory Visit: Payer: Self-pay

## 2013-12-09 ENCOUNTER — Ambulatory Visit (INDEPENDENT_AMBULATORY_CARE_PROVIDER_SITE_OTHER): Payer: Managed Care, Other (non HMO) | Admitting: Gastroenterology

## 2013-12-09 VITALS — BP 152/108 | HR 121 | Temp 98.0°F | Ht 73.0 in | Wt 176.0 lb

## 2013-12-09 DIAGNOSIS — R112 Nausea with vomiting, unspecified: Secondary | ICD-10-CM | POA: Insufficient documentation

## 2013-12-09 DIAGNOSIS — R109 Unspecified abdominal pain: Secondary | ICD-10-CM

## 2013-12-09 DIAGNOSIS — R1013 Epigastric pain: Secondary | ICD-10-CM

## 2013-12-09 DIAGNOSIS — R1011 Right upper quadrant pain: Secondary | ICD-10-CM | POA: Insufficient documentation

## 2013-12-09 DIAGNOSIS — K279 Peptic ulcer, site unspecified, unspecified as acute or chronic, without hemorrhage or perforation: Secondary | ICD-10-CM

## 2013-12-09 MED ORDER — PROMETHAZINE HCL 25 MG PO TABS: 25.0000 mg | ORAL_TABLET | Freq: Four times a day (QID) | ORAL | Status: DC | PRN

## 2013-12-09 MED ORDER — OXYCODONE HCL 5 MG PO CAPS: 5.0000 mg | ORAL_CAPSULE | ORAL | Status: DC | PRN

## 2013-12-09 MED ORDER — TECHNETIUM TC 99M MEBROFENIN IV KIT
5.0000 | PACK | Freq: Once | INTRAVENOUS | Status: AC | PRN
  Administered 2013-12-09: 5 via INTRAVENOUS

## 2013-12-09 MED ORDER — STERILE WATER FOR INJECTION IJ SOLN
INTRAMUSCULAR | Status: AC
  Administered 2013-12-09: 1.68 mL via INTRAVENOUS
  Filled 2013-12-09: qty 10

## 2013-12-09 MED ORDER — ONDANSETRON HCL 4 MG PO TABS: 4.0000 mg | ORAL_TABLET | Freq: Three times a day (TID) | ORAL | Status: DC

## 2013-12-09 MED ORDER — SODIUM CHLORIDE 0.9 % IJ SOLN
INTRAMUSCULAR | Status: AC
  Filled 2013-12-09: qty 36

## 2013-12-09 MED ORDER — SINCALIDE 5 MCG IJ SOLR
INTRAMUSCULAR | Status: AC
  Administered 2013-12-09: 1.68 ug via INTRAVENOUS
  Filled 2013-12-09: qty 5

## 2013-12-09 NOTE — Patient Instructions (Signed)
Start taking Zofran with meals, scheduled. Continue Reglan with meals and at bedtime. I sent Phenergan to your pharmacy to use as needed. I have provided a short course of pain medication. If you do not start feeling better, present to the emergency room.   We have scheduled you for an upper endoscopy with Dr. [name] for assessment of ulcer healing.   I have referred you to Dr. Arnoldo Morale to discuss elective cholecystectomy.

## 2013-12-09 NOTE — Progress Notes (Signed)
Referring Provider: [name], MD Primary Care Physician:  [name], MD  Primary GI: Dr. [name]   Chief Complaint  Patient presents with  . Gastoephresis    HPI:   KITAI DEVINCENT presents today in follow-up with history of uncontrolled diabetes, suspected gastroparesis, and PUD initially diagnosed in May 2015. Surveillance EGD still needed. Aug 2015 EGD with food debris in stomach, limiting exam. US abdomen 11/2013 with normal CBD, no gallstones, small gallbladder sludge. Subsequent HIDA with normal EF at 88%. He DID experience symptoms during CCK infusion.   Been on clear liquids since last hospitalized. Chest pain, back pain, radiating throughout chest. Vomiting about twice a day. HIDA scan with severe symptoms. Protonix BID. Reglan QID. Zofran doesn't help as well as Phenergan. Checks blood sugars 6 times a day. States keeping blood sugar regulated. No blood sugar over 200 for past 2 weeks. Schedule to see an endocrinologist in [place] in a few weeks.   Past Medical History  Diagnosis Date  . Diabetes mellitus     type 1  . Arthritis   . Back pain   . GERD (gastroesophageal reflux disease)   . PUD (peptic ulcer disease)   . Hypertension   . Heart murmur     valvular stenosis  . Neuropathy   . Diabetic gastroparesis associated with type 1 diabetes mellitus   . S/P arthroscopic knee surgery 07/04/2011  . Scoliosis 07/11/2012    Past Surgical History  Procedure Laterality Date  . Tympanostomy tube placement    . Knee arthroscopy  06/30/2011    Procedure: ARTHROSCOPY KNEE;  Surgeon: Carole Civil, MD;  Location: [facility];  Service: Orthopedics;  Laterality: Right;  diagnostic arthroscopy  . Esophagogastroduodenoscopy (egd) with propofol  06/17/2013    Dr. Oneida Alar: two gastric ulcers in fundus, mild antral gastritis, candida esophagitis  . Esophageal biopsy  06/17/2013    Procedure: GASTRIC ULCER AND ANTRAL BIOPSIES; ESOPHAGEAL BRUSHING;  Surgeon: Danie Binder, MD;  Location: [facility];  Service: Endoscopy;;  . Esophagogastroduodenoscopy (egd) with propofol N/A 09/11/2013    Dr. [name]: abnormal hypopharynx, question massively enlarged tonsils, stomach full of food precluded exam, needs EGD for verification of ulcer healing at a later date    Current Outpatient Prescriptions  Medication Sig Dispense Refill  . bismuth subsalicylate (PEPTO BISMOL) 262 MG/15ML suspension Take 30 mLs by mouth every 6 (six) hours as needed for indigestion or diarrhea or loose stools.    . gabapentin (NEURONTIN) 800 MG tablet Take 800 mg by mouth 3 (three) times daily.    . insulin NPH-regular Human (NOVOLIN 70/30) (70-30) 100 UNIT/ML injection Inject 20 Units into the skin 2 (two) times daily with a meal. 15 units in the morning and 25 at night. 10 mL 11  . insulin regular (NOVOLIN R,HUMULIN R) 100 units/mL injection Inject 2-10 Units into the skin 3 (three) times daily before meals. Sliding scale.    Marland Kitchen lisinopril (PRINIVIL,ZESTRIL) 20 MG tablet Take 20 mg by mouth daily.    . metoCLOPramide (REGLAN) 10 MG tablet Take 10 mg by mouth 4 (four) times daily -  before meals and at bedtime.    . pantoprazole (PROTONIX) 40 MG tablet Take 1 tablet (40 mg total) by mouth 2 (two) times daily before a meal. 60 tablet 2  . pravastatin (PRAVACHOL) 20 MG tablet Take 20 mg by mouth daily.     Marland Kitchen tiZANidine (ZANAFLEX) 4 MG tablet Take 1 tablet by mouth  every 6 (six) hours as needed for muscle spasms.     . ondansetron (ZOFRAN) 4 MG tablet Take 1 tablet (4 mg total) by mouth 3 (three) times daily with meals. 90 tablet 5  . oxycodone (OXY-IR) 5 MG capsule Take 1 capsule (5 mg total) by mouth every 4 (four) hours as needed. 20 capsule 0  . promethazine (PHENERGAN) 25 MG tablet Take 1 tablet (25 mg total) by mouth every 6 (six) hours as needed for nausea or vomiting. 40 tablet 1   No current facility-administered medications for this visit.    Allergies as of 12/09/2013 - Review Complete  12/09/2013  Allergen Reaction Noted  . Hydrocodone Hives 08/23/2013  . Ibuprofen Other (See Comments) 08/14/2011  . Naproxen Other (See Comments) 08/14/2011  . Sulfa antibiotics Other (See Comments) 08/14/2011  . Tramadol  10/14/2013  . Morphine and related Rash 01/04/2012    Family History  Problem Relation Age of Onset  . Cancer Father   . Alcohol abuse Father   . Pseudochol deficiency Neg Hx   . Malignant hyperthermia Neg Hx   . Hypotension Neg Hx   . Anesthesia problems Neg Hx   . Colon cancer Neg Hx     History   Social History  . Marital Status: Single    Spouse Name: N/A    Number of Children: N/A  . Years of Education: 12   Occupational History  . unemployed     trying to get disability   Social History Main Topics  . Smoking status: Current Some Day Smoker -- 0.50 packs/day for 10 years    Types: Cigarettes    Last Attempt to Quit: 10/07/2013  . Smokeless tobacco: Former Systems developer    Quit date: 02/17/2013     Comment: smokes 4 cigarettes a day   . Alcohol Use: No  . Drug Use: No  . Sexual Activity: None   Other Topics Concern  . None   Social History Narrative    Review of Systems: As mentioned in HPI  Physical Exam: BP 152/108 mmHg  Pulse 121  Temp(Src) 98 F (36.7 C) (Oral)  Ht 6\' 1"  (1.854 m)  Wt 176 lb (79.833 kg)  BMI 23.23 kg/m2 General:   Alert and oriented. Tachycardic and hypertensive without overtly concerning features. States significant pain from HIDA, which was done just prior to presentation to our office.  Head:  Normocephalic and atraumatic. Eyes:  Conjuctiva clear without scleral icterus. Mouth:  Oral mucosa pink and moist. Good dentition. No lesions. Neck:  Supple, without mass or thyromegaly. Heart:  S1, S2 present without murmurs, rubs, or gallops. Regular rate and rhythm. Abdomen:  +BS, soft, TTP epigastric and RUQ and non-distended. No rebound or guarding. No HSM or masses noted. Msk:  Symmetrical without gross deformities.  Normal posture. Extremities:  Without edema. Neurologic:  Alert and  oriented x4;  grossly normal neurologically. Skin:  Intact without significant lesions or rashes. Psych:  Alert and cooperative. Normal mood and affect.

## 2013-12-11 NOTE — Assessment & Plan Note (Signed)
28 year old male with known PUD, due for surveillance EGD now. Last complete EGD in May 2015. Continue Protonix 40 mg po BID. Needs clear liquid diet for 2 days prior to EGD due to concern for underlying diabetic gastroparesis.   Proceed with upper endoscopy in the near future with Dr. [name]. The risks, benefits, and alternatives have been discussed in detail with patient. They have stated understanding and desire to proceed.  PROPOFOL for sedation.

## 2013-12-11 NOTE — Assessment & Plan Note (Signed)
Likely multifactorial. Gallbladder remains in situ. [place] normal, subsequent HIDA with NORMAL EF at 88%; however, patient notes significant pain with CCK infusion. Will refer to Dr. Arnoldo Morale for consideration of elective cholecystectomy; however, I am not convinced this will alleviate his chronic abdominal pain. Discussed this with the patient, informing him that even despite cholecystectomy, he may still be dealing with chronic abdominal pain. Will leave at the discretion of Dr. Arnoldo Morale.

## 2013-12-15 ENCOUNTER — Telehealth: Payer: Self-pay | Admitting: Internal Medicine

## 2013-12-15 NOTE — Telephone Encounter (Signed)
Patient called having severe abdominal pain.  Wants to have another script for pain medicine.  Please advise [phone]

## 2013-12-15 NOTE — Telephone Encounter (Signed)
Does he have a PCP? Needs to obtain further from there.   When is appt with Dr. Arnoldo Morale?

## 2013-12-16 NOTE — Progress Notes (Signed)
cc'ed to pcp °

## 2013-12-16 NOTE — Telephone Encounter (Signed)
Referral has been faxed today

## 2013-12-16 NOTE — Telephone Encounter (Signed)
Patient states Dr Neta Mends in [place] is his PCP.   I made him aware of Anna's recommendation for pain meds.  He voiced understanding and disconnected the call.

## 2013-12-16 NOTE — Telephone Encounter (Signed)
Routing Ginger

## 2013-12-17 NOTE — Patient Instructions (Signed)
Brent Daniels  12/17/2013   Your procedure is scheduled on:   12/25/2013  Report to [facility] at  745  AM.  Call this number if you have problems the morning of surgery: [phone]   Remember:   Do not eat food or drink liquids after midnight.   Take these medicines the morning of surgery with A SIP OF WATER:  Gabapentin, lisinopril, reglan, zofran or ( phenergan), oxycodone, protonix, zanaflex   Do not wear jewelry, make-up or nail polish.  Do not wear lotions, powders, or perfumes.   Do not shave 48 hours prior to surgery. Men may shave face and neck.  Do not bring valuables to the hospital.  [facility] is not responsible for any belongings or valuables.               Contacts, dentures or bridgework may not be worn into surgery.  Leave suitcase in the car. After surgery it may be brought to your room.  For patients admitted to the hospital, discharge time is determined by your treatment team.               Patients discharged the day of surgery will not be allowed to drive home.  Name and phone number of your driver: family  Special Instructions: N/A   Please read over the following fact sheets that you were given: Pain Booklet, Coughing and Deep Breathing, Surgical Site Infection Prevention, Anesthesia Post-op Instructions and Care and Recovery After Surgery Esophagogastroduodenoscopy Esophagogastroduodenoscopy (EGD) is a procedure to examine the lining of the esophagus, stomach, and first part of the small intestine (duodenum). A long, flexible, lighted tube with a camera attached (endoscope) is inserted down the throat to view these organs. This procedure is done to detect problems or abnormalities, such as inflammation, bleeding, ulcers, or growths, in order to treat them. The procedure lasts about 5-20 minutes. It is usually an outpatient procedure, but it may need to be performed in emergency cases in the hospital. LET YOUR CAREGIVER KNOW ABOUT:   Allergies to  food or medicine.  All medicines you are taking, including vitamins, herbs, eyedrops, and over-the-counter medicines and creams.  Use of steroids (by mouth or creams).  Previous problems you or members of your family have had with the use of anesthetics.  Any blood disorders you have.  Previous surgeries you have had.  Other health problems you have.  Possibility of pregnancy, if this applies. RISKS AND COMPLICATIONS  Generally, EGD is a safe procedure. However, as with any procedure, complications can occur. Possible complications include:  Infection.  Bleeding.  Tearing (perforation) of the esophagus, stomach, or duodenum.  Difficulty breathing or not being able to breath.  Excessive sweating.  Spasms of the larynx.  Slowed heartbeat.  Low blood pressure. BEFORE THE PROCEDURE  Do not eat or drink anything for 6-8 hours before the procedure or as directed by your caregiver.  Ask your caregiver about changing or stopping your regular medicines.  If you wear dentures, be prepared to remove them before the procedure.  Arrange for someone to drive you home after the procedure. PROCEDURE   A vein will be accessed to give medicines and fluids. A medicine to relax you (sedative) and a pain reliever will be given through that access into the vein.  A numbing medicine (local anesthetic) may be sprayed on your throat for comfort and to stop you from gagging or coughing.  A mouth guard may be placed  in your mouth to protect your teeth and to keep you from biting on the endoscope.  You will be asked to lie on your left side.  The endoscope is inserted down your throat and into the esophagus, stomach, and duodenum.  Air is put through the endoscope to allow your caregiver to view the lining of your esophagus clearly.  The esophagus, stomach, and duodenum is then examined. During the exam, your caregiver may:  Remove tissue to be examined under a microscope (biopsy) for  inflammation, infection, or other medical problems.  Remove growths.  Remove objects (foreign bodies) that are stuck.  Treat any bleeding with medicines or other devices that stop tissues from bleeding (hot cautery, clipping devices).  Widen (dilate) or stretch narrowed areas of the esophagus and stomach.  The endoscope will then be withdrawn. AFTER THE PROCEDURE  You will be taken to a recovery area to be monitored. You will be able to go home once you are stable and alert.  Do not eat or drink anything until the local anesthetic and numbing medicines have worn off. You may choke.  It is normal to feel bloated, have pain with swallowing, or have a sore throat for a short time. This will wear off.  Your caregiver should be able to discuss his or her findings with you. It will take longer to discuss the test results if any biopsies were taken. Document Released: 05/26/2004 Document Revised: 06/09/2013 Document Reviewed: 12/27/2011 [facility] Patient Information 2015 [place], [place]. This information is not intended to replace advice given to you by your health care provider. Make sure you discuss any questions you have with your health care provider. PATIENT INSTRUCTIONS POST-ANESTHESIA  IMMEDIATELY FOLLOWING SURGERY:  Do not drive or operate machinery for the first twenty four hours after surgery.  Do not make any important decisions for twenty four hours after surgery or while taking narcotic pain medications or sedatives.  If you develop intractable nausea and vomiting or a severe headache please notify your doctor immediately.  FOLLOW-UP:  Please make an appointment with your surgeon as instructed. You do not need to follow up with anesthesia unless specifically instructed to do so.  WOUND CARE INSTRUCTIONS (if applicable):  Keep a dry clean dressing on the anesthesia/puncture wound site if there is drainage.  Once the wound has quit draining you may leave it open to air.  Generally you  should leave the bandage intact for twenty four hours unless there is drainage.  If the epidural site drains for more than 36-48 hours please call the anesthesia department.  QUESTIONS?:  Please feel free to call your physician or the hospital operator if you have any questions, and they will be happy to assist you.

## 2013-12-17 NOTE — Telephone Encounter (Signed)
Pt has appointment with Dr.Jenkins on 12/30/13 @ 10:45. He is aware

## 2013-12-19 ENCOUNTER — Encounter (HOSPITAL_COMMUNITY)
Admission: RE | Admit: 2013-12-19 | Discharge: 2013-12-19 | Disposition: A | Discharge status: Home / Self Care | Payer: Managed Care, Other (non HMO) | Source: Ambulatory Visit | Attending: Internal Medicine | Admitting: Internal Medicine

## 2013-12-19 ENCOUNTER — Encounter (HOSPITAL_COMMUNITY): Payer: Self-pay | Admitting: Emergency Medicine

## 2013-12-19 ENCOUNTER — Inpatient Hospital Stay (HOSPITAL_COMMUNITY)
Admission: EM | Admit: 2013-12-19 | Discharge: 2013-12-23 | DRG: 391 | Disposition: A | Discharge status: Home / Self Care | Payer: Managed Care, Other (non HMO) | Attending: Internal Medicine | Admitting: Internal Medicine

## 2013-12-19 DIAGNOSIS — F17210 Nicotine dependence, cigarettes, uncomplicated: Secondary | ICD-10-CM | POA: Diagnosis present

## 2013-12-19 DIAGNOSIS — G8929 Other chronic pain: Secondary | ICD-10-CM | POA: Diagnosis present

## 2013-12-19 DIAGNOSIS — R112 Nausea with vomiting, unspecified: Secondary | ICD-10-CM | POA: Diagnosis not present

## 2013-12-19 DIAGNOSIS — K279 Peptic ulcer, site unspecified, unspecified as acute or chronic, without hemorrhage or perforation: Secondary | ICD-10-CM | POA: Diagnosis present

## 2013-12-19 DIAGNOSIS — R739 Hyperglycemia, unspecified: Secondary | ICD-10-CM

## 2013-12-19 DIAGNOSIS — D72829 Elevated white blood cell count, unspecified: Secondary | ICD-10-CM | POA: Diagnosis present

## 2013-12-19 DIAGNOSIS — E1043 Type 1 diabetes mellitus with diabetic autonomic (poly)neuropathy: Secondary | ICD-10-CM | POA: Diagnosis present

## 2013-12-19 DIAGNOSIS — E1165 Type 2 diabetes mellitus with hyperglycemia: Secondary | ICD-10-CM | POA: Diagnosis present

## 2013-12-19 DIAGNOSIS — K210 Gastro-esophageal reflux disease with esophagitis: Principal | ICD-10-CM | POA: Diagnosis present

## 2013-12-19 DIAGNOSIS — K3184 Gastroparesis: Secondary | ICD-10-CM

## 2013-12-19 DIAGNOSIS — M419 Scoliosis, unspecified: Secondary | ICD-10-CM | POA: Diagnosis present

## 2013-12-19 DIAGNOSIS — R1110 Vomiting, unspecified: Secondary | ICD-10-CM

## 2013-12-19 DIAGNOSIS — M1990 Unspecified osteoarthritis, unspecified site: Secondary | ICD-10-CM | POA: Diagnosis present

## 2013-12-19 DIAGNOSIS — K209 Esophagitis, unspecified without bleeding: Secondary | ICD-10-CM | POA: Diagnosis present

## 2013-12-19 DIAGNOSIS — E1010 Type 1 diabetes mellitus with ketoacidosis without coma: Secondary | ICD-10-CM | POA: Diagnosis present

## 2013-12-19 DIAGNOSIS — Z794 Long term (current) use of insulin: Secondary | ICD-10-CM

## 2013-12-19 DIAGNOSIS — R109 Unspecified abdominal pain: Secondary | ICD-10-CM

## 2013-12-19 DIAGNOSIS — R1084 Generalized abdominal pain: Secondary | ICD-10-CM

## 2013-12-19 DIAGNOSIS — IMO0002 Reserved for concepts with insufficient information to code with codable children: Secondary | ICD-10-CM | POA: Diagnosis present

## 2013-12-19 DIAGNOSIS — R000 Tachycardia, unspecified: Secondary | ICD-10-CM | POA: Diagnosis present

## 2013-12-19 DIAGNOSIS — Z8711 Personal history of peptic ulcer disease: Secondary | ICD-10-CM

## 2013-12-19 DIAGNOSIS — E1110 Type 2 diabetes mellitus with ketoacidosis without coma: Secondary | ICD-10-CM | POA: Diagnosis present

## 2013-12-19 DIAGNOSIS — I10 Essential (primary) hypertension: Secondary | ICD-10-CM | POA: Diagnosis present

## 2013-12-19 HISTORY — DX: Nausea with vomiting, unspecified: R11.2

## 2013-12-19 HISTORY — DX: Esophagitis, unspecified without bleeding: K20.90

## 2013-12-19 HISTORY — DX: Dorsalgia, unspecified: M54.9

## 2013-12-19 HISTORY — DX: Unspecified abdominal pain: R10.9

## 2013-12-19 HISTORY — DX: Esophagitis, unspecified: K20.9

## 2013-12-19 HISTORY — DX: Other chronic pain: G89.29

## 2013-12-19 LAB — CBG MONITORING, ED: Glucose-Capillary: 414 mg/dL — ABNORMAL HIGH (ref 70–99)

## 2013-12-19 MED ORDER — SODIUM CHLORIDE 0.9 % IV BOLUS (SEPSIS)
1000.0000 mL | Freq: Once | INTRAVENOUS | Status: AC
  Administered 2013-12-19: 1000 mL via INTRAVENOUS

## 2013-12-19 MED ORDER — FAMOTIDINE IN NACL 20-0.9 MG/50ML-% IV SOLN
20.0000 mg | Freq: Once | INTRAVENOUS | Status: AC
  Administered 2013-12-20: 20 mg via INTRAVENOUS
  Filled 2013-12-19: qty 50

## 2013-12-19 MED ORDER — PROMETHAZINE HCL 25 MG/ML IJ SOLN
12.5000 mg | Freq: Once | INTRAMUSCULAR | Status: AC
  Administered 2013-12-19: 12.5 mg via INTRAVENOUS
  Filled 2013-12-19: qty 1

## 2013-12-19 MED ORDER — FENTANYL CITRATE 0.05 MG/ML IJ SOLN
100.0000 ug | INTRAMUSCULAR | Status: AC | PRN
  Administered 2013-12-20 (×2): 100 ug via INTRAVENOUS
  Filled 2013-12-19 (×2): qty 2

## 2013-12-19 MED ORDER — SODIUM CHLORIDE 0.9 % IV SOLN
INTRAVENOUS | Status: DC
  Administered 2013-12-19: 1000 mL via INTRAVENOUS

## 2013-12-19 NOTE — ED Notes (Signed)
Patient was given Zofran 4mg  IV en route.  Patient states took Insulin about 5 hours ago.  Patient actively vomiting at this time.

## 2013-12-19 NOTE — ED Notes (Signed)
Patient c/o vomiting and hyperglycemia x 1 day.  Patient states has OR consult later this month for cholecystectomy.

## 2013-12-19 NOTE — ED Notes (Signed)
Pt states he is out of test strips & unable to check blood sugars. Pt also complaining of abdominal pain. Has appt to see Dr Arnoldo Morale on the 24th about his gall bladder.

## 2013-12-20 ENCOUNTER — Observation Stay (HOSPITAL_COMMUNITY): Payer: Managed Care, Other (non HMO)

## 2013-12-20 ENCOUNTER — Emergency Department (HOSPITAL_COMMUNITY): Payer: Managed Care, Other (non HMO)

## 2013-12-20 DIAGNOSIS — G8929 Other chronic pain: Secondary | ICD-10-CM

## 2013-12-20 DIAGNOSIS — R112 Nausea with vomiting, unspecified: Secondary | ICD-10-CM | POA: Diagnosis present

## 2013-12-20 DIAGNOSIS — K210 Gastro-esophageal reflux disease with esophagitis: Secondary | ICD-10-CM | POA: Diagnosis present

## 2013-12-20 DIAGNOSIS — Z8711 Personal history of peptic ulcer disease: Secondary | ICD-10-CM | POA: Diagnosis not present

## 2013-12-20 DIAGNOSIS — M1990 Unspecified osteoarthritis, unspecified site: Secondary | ICD-10-CM | POA: Diagnosis present

## 2013-12-20 DIAGNOSIS — R1110 Vomiting, unspecified: Secondary | ICD-10-CM

## 2013-12-20 DIAGNOSIS — M419 Scoliosis, unspecified: Secondary | ICD-10-CM | POA: Diagnosis present

## 2013-12-20 DIAGNOSIS — E1010 Type 1 diabetes mellitus with ketoacidosis without coma: Secondary | ICD-10-CM | POA: Diagnosis present

## 2013-12-20 DIAGNOSIS — D72829 Elevated white blood cell count, unspecified: Secondary | ICD-10-CM | POA: Diagnosis present

## 2013-12-20 DIAGNOSIS — E1165 Type 2 diabetes mellitus with hyperglycemia: Secondary | ICD-10-CM

## 2013-12-20 DIAGNOSIS — E1043 Type 1 diabetes mellitus with diabetic autonomic (poly)neuropathy: Secondary | ICD-10-CM | POA: Diagnosis present

## 2013-12-20 DIAGNOSIS — K279 Peptic ulcer, site unspecified, unspecified as acute or chronic, without hemorrhage or perforation: Secondary | ICD-10-CM

## 2013-12-20 DIAGNOSIS — K3184 Gastroparesis: Secondary | ICD-10-CM | POA: Diagnosis present

## 2013-12-20 DIAGNOSIS — F17210 Nicotine dependence, cigarettes, uncomplicated: Secondary | ICD-10-CM | POA: Diagnosis present

## 2013-12-20 DIAGNOSIS — I10 Essential (primary) hypertension: Secondary | ICD-10-CM | POA: Diagnosis present

## 2013-12-20 DIAGNOSIS — Z794 Long term (current) use of insulin: Secondary | ICD-10-CM | POA: Diagnosis not present

## 2013-12-20 DIAGNOSIS — R739 Hyperglycemia, unspecified: Secondary | ICD-10-CM | POA: Diagnosis present

## 2013-12-20 DIAGNOSIS — R1084 Generalized abdominal pain: Secondary | ICD-10-CM

## 2013-12-20 LAB — BASIC METABOLIC PANEL
Anion gap: 20 — ABNORMAL HIGH (ref 5–15)
Anion gap: 20 — ABNORMAL HIGH (ref 5–15)
Anion gap: 20 — ABNORMAL HIGH (ref 5–15)
Anion gap: 22 — ABNORMAL HIGH (ref 5–15)
BUN: 19 mg/dL (ref 6–23)
BUN: 18 mg/dL (ref 6–23)
BUN: 15 mg/dL (ref 6–23)
BUN: 14 mg/dL (ref 6–23)
BUN: 15 mg/dL (ref 6–23)
CHLORIDE: 95 meq/L — AB (ref 96–112)
CHLORIDE: 93 meq/L — AB (ref 96–112)
CO2: 27 meq/L (ref 19–32)
CO2: 25 meq/L (ref 19–32)
CO2: 24 meq/L (ref 19–32)
CO2: 24 meq/L (ref 19–32)
CO2: 21 meq/L (ref 19–32)
CREATININE: 0.92 mg/dL (ref 0.50–1.35)
Calcium: 9.2 mg/dL (ref 8.4–10.5)
Calcium: 9.2 mg/dL (ref 8.4–10.5)
Calcium: 9.1 mg/dL (ref 8.4–10.5)
Calcium: 9.2 mg/dL (ref 8.4–10.5)
Calcium: 9.3 mg/dL (ref 8.4–10.5)
Chloride: 94 mEq/L — ABNORMAL LOW (ref 96–112)
Chloride: 94 mEq/L — ABNORMAL LOW (ref 96–112)
Chloride: 94 mEq/L — ABNORMAL LOW (ref 96–112)
Creatinine, Ser: 0.95 mg/dL (ref 0.50–1.35)
Creatinine, Ser: 0.85 mg/dL (ref 0.50–1.35)
Creatinine, Ser: 0.83 mg/dL (ref 0.50–1.35)
Creatinine, Ser: 0.79 mg/dL (ref 0.50–1.35)
GFR calc Af Amer: >90 mL/min (ref >90)
GFR calc Af Amer: >90 mL/min (ref >90)
GFR calc Af Amer: >90 mL/min (ref >90)
GFR calc Af Amer: >90 mL/min (ref >90)
GFR calc non Af Amer: >90 mL/min (ref >90)
GFR calc non Af Amer: >90 mL/min (ref >90)
GFR calc non Af Amer: >90 mL/min (ref >90)
GFR calc non Af Amer: >90 mL/min (ref >90)
GLUCOSE: 273 mg/dL — AB (ref 70–99)
Glucose, Bld: 252 mg/dL — ABNORMAL HIGH (ref 70–99)
Glucose, Bld: 226 mg/dL — ABNORMAL HIGH (ref 70–99)
Glucose, Bld: 228 mg/dL — ABNORMAL HIGH (ref 70–99)
Glucose, Bld: 246 mg/dL — ABNORMAL HIGH (ref 70–99)
POTASSIUM: 3.9 meq/L (ref 3.7–5.3)
POTASSIUM: 3.9 meq/L (ref 3.7–5.3)
POTASSIUM: 3.9 meq/L (ref 3.7–5.3)
POTASSIUM: 4 meq/L (ref 3.7–5.3)
Potassium: 4.4 mEq/L (ref 3.7–5.3)
SODIUM: 138 meq/L (ref 137–147)
SODIUM: 138 meq/L (ref 137–147)
SODIUM: 136 meq/L — AB (ref 137–147)
Sodium: 141 mEq/L (ref 137–147)
Sodium: 142 mEq/L (ref 137–147)

## 2013-12-20 LAB — COMPREHENSIVE METABOLIC PANEL
ALT: 13 U/L (ref 0–53)
ALT: 11 U/L (ref 0–53)
ANION GAP: 22 — AB (ref 5–15)
AST: 14 U/L (ref 0–37)
AST: 13 U/L (ref 0–37)
Albumin: 4.5 g/dL (ref 3.5–5.2)
Albumin: 4.1 g/dL (ref 3.5–5.2)
Alkaline Phosphatase: 104 U/L (ref 39–117)
Alkaline Phosphatase: 94 U/L (ref 39–117)
Anion gap: 19 — ABNORMAL HIGH (ref 5–15)
BUN: 21 mg/dL (ref 6–23)
BUN: 19 mg/dL (ref 6–23)
CALCIUM: 9.1 mg/dL (ref 8.4–10.5)
CALCIUM: 9.7 mg/dL (ref 8.4–10.5)
CO2: 28 mEq/L (ref 19–32)
CO2: 27 mEq/L (ref 19–32)
CREATININE: 0.95 mg/dL (ref 0.50–1.35)
CREATININE: 1.09 mg/dL (ref 0.50–1.35)
Chloride: 89 mEq/L — ABNORMAL LOW (ref 96–112)
Chloride: 96 mEq/L (ref 96–112)
GFR calc non Af Amer: >90 mL/min (ref >90)
GFR, EST NON AFRICAN AMERICAN: 90 mL/min — AB (ref >90)
GLUCOSE: 251 mg/dL — AB (ref 70–99)
GLUCOSE: 354 mg/dL — AB (ref 70–99)
Potassium: 3.7 mEq/L (ref 3.7–5.3)
Potassium: 4 mEq/L (ref 3.7–5.3)
Sodium: 139 mEq/L (ref 137–147)
Sodium: 142 mEq/L (ref 137–147)
TOTAL PROTEIN: 7.5 g/dL (ref 6.0–8.3)
Total Bilirubin: 1.3 mg/dL — ABNORMAL HIGH (ref 0.3–1.2)
Total Bilirubin: 1.1 mg/dL (ref 0.3–1.2)
Total Protein: 8 g/dL (ref 6.0–8.3)

## 2013-12-20 LAB — GLUCOSE, CAPILLARY
GLUCOSE-CAPILLARY: 210 mg/dL — AB (ref 70–99)
GLUCOSE-CAPILLARY: 189 mg/dL — AB (ref 70–99)
Glucose-Capillary: 242 mg/dL — ABNORMAL HIGH (ref 70–99)
Glucose-Capillary: 226 mg/dL — ABNORMAL HIGH (ref 70–99)
Glucose-Capillary: 216 mg/dL — ABNORMAL HIGH (ref 70–99)
Glucose-Capillary: 239 mg/dL — ABNORMAL HIGH (ref 70–99)
Glucose-Capillary: 247 mg/dL — ABNORMAL HIGH (ref 70–99)

## 2013-12-20 LAB — CBC
HCT: 38.1 % — ABNORMAL LOW (ref 39.0–52.0)
HEMATOCRIT: 38.8 % — AB (ref 39.0–52.0)
HEMOGLOBIN: 13.3 g/dL (ref 13.0–17.0)
HEMOGLOBIN: 13.2 g/dL (ref 13.0–17.0)
MCH: 30.6 pg (ref 26.0–34.0)
MCH: 30.3 pg (ref 26.0–34.0)
MCHC: 34.9 g/dL (ref 30.0–36.0)
MCHC: 34 g/dL (ref 30.0–36.0)
MCV: 87.8 fL (ref 78.0–100.0)
MCV: 89.2 fL (ref 78.0–100.0)
Platelets: 395 10*3/uL (ref 150–400)
Platelets: 418 10*3/uL — ABNORMAL HIGH (ref 150–400)
RBC: 4.34 MIL/uL (ref 4.22–5.81)
RBC: 4.35 MIL/uL (ref 4.22–5.81)
RDW: 14.4 % (ref 11.5–15.5)
RDW: 14.7 % (ref 11.5–15.5)
WBC: 21.8 10*3/uL — AB (ref 4.0–10.5)
WBC: 22.3 10*3/uL — ABNORMAL HIGH (ref 4.0–10.5)

## 2013-12-20 LAB — CBC WITH DIFFERENTIAL/PLATELET
BASOS ABS: 0 10*3/uL (ref 0.0–0.1)
Basophils Relative: 0 % (ref 0–1)
Eosinophils Absolute: 0 10*3/uL (ref 0.0–0.7)
Eosinophils Relative: 0 % (ref 0–5)
HCT: 40.4 % (ref 39.0–52.0)
Hemoglobin: 14 g/dL (ref 13.0–17.0)
LYMPHS ABS: 1.4 10*3/uL (ref 0.7–4.0)
LYMPHS PCT: 8 % — AB (ref 12–46)
MCH: 30.2 pg (ref 26.0–34.0)
MCHC: 34.7 g/dL (ref 30.0–36.0)
MCV: 87.3 fL (ref 78.0–100.0)
MONO ABS: 1 10*3/uL (ref 0.1–1.0)
Monocytes Relative: 6 % (ref 3–12)
Neutro Abs: 14.9 10*3/uL — ABNORMAL HIGH (ref 1.7–7.7)
Neutrophils Relative %: 86 % — ABNORMAL HIGH (ref 43–77)
Platelets: 409 10*3/uL — ABNORMAL HIGH (ref 150–400)
RBC: 4.63 MIL/uL (ref 4.22–5.81)
RDW: 14.3 % (ref 11.5–15.5)
WBC: 17.4 10*3/uL — AB (ref 4.0–10.5)

## 2013-12-20 LAB — URINALYSIS, ROUTINE W REFLEX MICROSCOPIC
Hgb urine dipstick: NEGATIVE
Ketones, ur: >80 mg/dL — AB
Leukocytes, UA: NEGATIVE
Nitrite: NEGATIVE
Protein, ur: NEGATIVE mg/dL
Specific Gravity, Urine: 1.015 (ref 1.005–1.030)
Urobilinogen, UA: 0.2 mg/dL (ref 0.0–1.0)
pH: 5.5 (ref 5.0–8.0)

## 2013-12-20 LAB — URINE MICROSCOPIC-ADD ON

## 2013-12-20 LAB — CBG MONITORING, ED: GLUCOSE-CAPILLARY: 296 mg/dL — AB (ref 70–99)

## 2013-12-20 LAB — GLUCOSE, RANDOM: Glucose, Bld: 270 mg/dL — ABNORMAL HIGH (ref 70–99)

## 2013-12-20 LAB — HEMOGLOBIN A1C
HEMOGLOBIN A1C: 10.1 % — AB (ref <5.7)
MEAN PLASMA GLUCOSE: 243 mg/dL — AB (ref <117)

## 2013-12-20 LAB — LIPASE, BLOOD: LIPASE: 7 U/L — AB (ref 11–59)

## 2013-12-20 MED ORDER — INSULIN ASPART 100 UNIT/ML ~~LOC~~ SOLN
3.0000 [IU] | Freq: Once | SUBCUTANEOUS | Status: AC
  Administered 2013-12-20: 3 [IU] via SUBCUTANEOUS
  Filled 2013-12-20: qty 1

## 2013-12-20 MED ORDER — HYDROMORPHONE HCL 1 MG/ML IJ SOLN: 0.5000 mg | Freq: Once | INTRAMUSCULAR | Status: AC

## 2013-12-20 MED ORDER — METOPROLOL TARTRATE 1 MG/ML IV SOLN
5.0000 mg | INTRAVENOUS | Status: DC
  Administered 2013-12-20 – 2013-12-22 (×12): 5 mg via INTRAVENOUS
  Filled 2013-12-20 (×13): qty 5

## 2013-12-20 MED ORDER — KCL IN DEXTROSE-NACL 20-5-0.45 MEQ/L-%-% IV SOLN
INTRAVENOUS | Status: DC
  Administered 2013-12-20: 06:00:00 via INTRAVENOUS

## 2013-12-20 MED ORDER — LORAZEPAM 2 MG/ML IJ SOLN
0.2500 mg | Freq: Four times a day (QID) | INTRAMUSCULAR | Status: DC | PRN
  Administered 2013-12-20 – 2013-12-23 (×8): 0.25 mg via INTRAVENOUS
  Filled 2013-12-20 (×8): qty 1

## 2013-12-20 MED ORDER — PROMETHAZINE HCL 25 MG/ML IJ SOLN
12.5000 mg | Freq: Once | INTRAMUSCULAR | Status: AC
  Administered 2013-12-20: 12.5 mg via INTRAVENOUS

## 2013-12-20 MED ORDER — GABAPENTIN 400 MG PO CAPS: 800.0000 mg | ORAL_CAPSULE | Freq: Three times a day (TID) | ORAL | Status: DC

## 2013-12-20 MED ORDER — ALUM & MAG HYDROXIDE-SIMETH 200-200-20 MG/5ML PO SUSP: 30.0000 mL | Freq: Four times a day (QID) | ORAL | Status: DC | PRN

## 2013-12-20 MED ORDER — ONDANSETRON HCL 4 MG PO TABS: 4.0000 mg | ORAL_TABLET | Freq: Four times a day (QID) | ORAL | Status: DC | PRN

## 2013-12-20 MED ORDER — HYDROMORPHONE HCL 1 MG/ML IJ SOLN
1.0000 mg | INTRAMUSCULAR | Status: DC | PRN
  Administered 2013-12-20 – 2013-12-23 (×23): 1 mg via INTRAVENOUS
  Filled 2013-12-20 (×24): qty 1

## 2013-12-20 MED ORDER — PANTOPRAZOLE SODIUM 40 MG IV SOLR
40.0000 mg | Freq: Two times a day (BID) | INTRAVENOUS | Status: DC
  Administered 2013-12-20 – 2013-12-22 (×6): 40 mg via INTRAVENOUS
  Filled 2013-12-20 (×6): qty 40

## 2013-12-20 MED ORDER — POTASSIUM CHLORIDE IN NACL 20-0.45 MEQ/L-% IV SOLN
INTRAVENOUS | Status: DC
  Administered 2013-12-20: 09:00:00 via INTRAVENOUS
  Filled 2013-12-20 (×7): qty 1000

## 2013-12-20 MED ORDER — TIZANIDINE HCL 4 MG PO TABS
4.0000 mg | ORAL_TABLET | Freq: Four times a day (QID) | ORAL | Status: DC | PRN
  Filled 2013-12-20: qty 1

## 2013-12-20 MED ORDER — HYDROMORPHONE HCL 1 MG/ML IJ SOLN
1.0000 mg | INTRAMUSCULAR | Status: DC | PRN
  Filled 2013-12-20: qty 1

## 2013-12-20 MED ORDER — GABAPENTIN 400 MG PO CAPS
800.0000 mg | ORAL_CAPSULE | Freq: Three times a day (TID) | ORAL | Status: DC
  Administered 2013-12-20: 800 mg via ORAL
  Filled 2013-12-20: qty 2

## 2013-12-20 MED ORDER — INSULIN ASPART 100 UNIT/ML ~~LOC~~ SOLN
1.0000 [IU] | Freq: Once | SUBCUTANEOUS | Status: AC
Start: 2013-12-20 — End: 2013-12-20
  Administered 2013-12-20: 1 [IU] via SUBCUTANEOUS

## 2013-12-20 MED ORDER — METOCLOPRAMIDE HCL 5 MG/ML IJ SOLN
10.0000 mg | Freq: Four times a day (QID) | INTRAMUSCULAR | Status: DC
  Administered 2013-12-20 – 2013-12-22 (×8): 10 mg via INTRAVENOUS
  Filled 2013-12-20 (×9): qty 2

## 2013-12-20 MED ORDER — METOCLOPRAMIDE HCL 5 MG/ML IJ SOLN
10.0000 mg | Freq: Three times a day (TID) | INTRAMUSCULAR | Status: DC
  Administered 2013-12-20: 10 mg via INTRAVENOUS
  Filled 2013-12-20: qty 2

## 2013-12-20 MED ORDER — SODIUM CHLORIDE 0.9 % IV SOLN
INTRAVENOUS | Status: DC
  Administered 2013-12-20: 04:00:00 via INTRAVENOUS

## 2013-12-20 MED ORDER — ENOXAPARIN SODIUM 40 MG/0.4ML ~~LOC~~ SOLN
40.0000 mg | SUBCUTANEOUS | Status: DC
  Administered 2013-12-20 – 2013-12-22 (×3): 40 mg via SUBCUTANEOUS
  Filled 2013-12-20 (×3): qty 0.4

## 2013-12-20 MED ORDER — LISINOPRIL 10 MG PO TABS
20.0000 mg | ORAL_TABLET | Freq: Every day | ORAL | Status: DC
  Filled 2013-12-20: qty 2

## 2013-12-20 MED ORDER — INSULIN ASPART 100 UNIT/ML ~~LOC~~ SOLN
2.0000 [IU] | Freq: Once | SUBCUTANEOUS | Status: AC
  Administered 2013-12-20: 2 [IU] via SUBCUTANEOUS

## 2013-12-20 MED ORDER — SODIUM CHLORIDE 0.9 % IV BOLUS (SEPSIS): 1000.0000 mL | Freq: Once | INTRAVENOUS | Status: DC

## 2013-12-20 MED ORDER — ONDANSETRON HCL 4 MG/2ML IJ SOLN
4.0000 mg | Freq: Four times a day (QID) | INTRAMUSCULAR | Status: DC | PRN
  Administered 2013-12-20 (×2): 4 mg via INTRAVENOUS
  Filled 2013-12-20 (×2): qty 2

## 2013-12-20 MED ORDER — SODIUM CHLORIDE 0.9 % IJ SOLN
3.0000 mL | Freq: Two times a day (BID) | INTRAMUSCULAR | Status: DC
  Administered 2013-12-20 – 2013-12-22 (×4): 3 mL via INTRAVENOUS
  Administered 2013-12-22: 10 mL via INTRAVENOUS
  Administered 2013-12-22: 3 mL via INTRAVENOUS

## 2013-12-20 MED ORDER — PROMETHAZINE HCL 25 MG/ML IJ SOLN
INTRAMUSCULAR | Status: AC
  Filled 2013-12-20: qty 1

## 2013-12-20 MED ORDER — PANTOPRAZOLE SODIUM 40 MG PO TBEC: 40.0000 mg | DELAYED_RELEASE_TABLET | Freq: Two times a day (BID) | ORAL | Status: DC

## 2013-12-20 MED ORDER — HYDROMORPHONE HCL 1 MG/ML IJ SOLN
1.0000 mg | INTRAMUSCULAR | Status: DC | PRN
  Administered 2013-12-20: 1 mg via INTRAVENOUS

## 2013-12-20 MED ORDER — SODIUM CHLORIDE 0.9 % IV SOLN: Freq: Once | INTRAVENOUS | Status: DC

## 2013-12-20 MED ORDER — ONDANSETRON HCL 4 MG/2ML IJ SOLN: 4.0000 mg | Freq: Three times a day (TID) | INTRAMUSCULAR | Status: DC | PRN

## 2013-12-20 MED ORDER — SODIUM CHLORIDE 0.9 % IV BOLUS (SEPSIS)
1000.0000 mL | Freq: Once | INTRAVENOUS | Status: AC
  Administered 2013-12-20: 1000 mL via INTRAVENOUS

## 2013-12-20 MED ORDER — INSULIN ASPART 100 UNIT/ML ~~LOC~~ SOLN
0.0000 [IU] | SUBCUTANEOUS | Status: DC
  Administered 2013-12-20: 3 [IU] via SUBCUTANEOUS
  Administered 2013-12-20 (×3): 5 [IU] via SUBCUTANEOUS
  Administered 2013-12-21: 8 [IU] via SUBCUTANEOUS
  Administered 2013-12-21: 2 [IU] via SUBCUTANEOUS

## 2013-12-20 MED ORDER — CLONIDINE HCL 0.1 MG/24HR TD PTWK
0.1000 mg | MEDICATED_PATCH | TRANSDERMAL | Status: DC
  Administered 2013-12-20: 0.1 mg via TRANSDERMAL
  Filled 2013-12-20: qty 1

## 2013-12-20 MED ORDER — SODIUM CHLORIDE 0.9 % IV SOLN: INTRAVENOUS | Status: DC

## 2013-12-20 MED ORDER — ONDANSETRON HCL 4 MG/2ML IJ SOLN
4.0000 mg | INTRAMUSCULAR | Status: DC | PRN
  Administered 2013-12-20: 4 mg via INTRAVENOUS
  Filled 2013-12-20: qty 2

## 2013-12-20 NOTE — ED Notes (Signed)
Pt vomiting again at this time & requesting more pain meds.

## 2013-12-20 NOTE — Progress Notes (Signed)
UR completed 

## 2013-12-20 NOTE — H&P (Signed)
PCP:   [name], MD   Chief Complaint:  abd pain n/v  HPI: 28 yo male h/o type 1 dm (since age 64yo), gastroparesis, PUD, chronic abd pain comes in with ruq abd pain with associated n/v.  Pt has been seeing GI as outpt and has had hida scan which showed nml ED but pt had pain with cck infusion.  He has been referred to dr Arnoldo Morale as outpt for evaluation of elective cholecystecomy.  Pt says he avoids all unhealthy foods, b/c when he eats certain things such as pork or fried foods the pain is worse (pt last hga1c last month was over 11%).  When these episodes occur his reglan does not help.  No fevers.  He denies his diabetes is poorly controlled.  But does admit that he cannot afford his insulin and insulin stripes.  He is on his fathers health insurance policy at [employer] (he is a lifelong dependent) and the insurance does not pay for his insulin /or stripes he says because its all over the counter.  Denies any blood in vomit.  Review of Systems:  Positive and negative as per HPI otherwise all other systems are negative  Past Medical History: Past Medical History  Diagnosis Date  . Diabetes mellitus     type 1  . Arthritis   . GERD (gastroesophageal reflux disease)   . PUD (peptic ulcer disease)   . Hypertension   . Heart murmur     valvular stenosis  . Neuropathy   . Diabetic gastroparesis associated with type 1 diabetes mellitus   . S/P arthroscopic knee surgery 07/04/2011  . Scoliosis 07/11/2012  . Chronic back pain   . Chronic abdominal pain   . Nausea and vomiting     chronic, recurrent   Past Surgical History  Procedure Laterality Date  . Tympanostomy tube placement    . Knee arthroscopy  06/30/2011    Procedure: ARTHROSCOPY KNEE;  Surgeon: Carole Civil, MD;  Location: [facility];  Service: Orthopedics;  Laterality: Right;  diagnostic arthroscopy  . Esophagogastroduodenoscopy (egd) with propofol  06/17/2013    Dr. Oneida Alar: two gastric ulcers in fundus, mild  antral gastritis, candida esophagitis  . Esophageal biopsy  06/17/2013    Procedure: GASTRIC ULCER AND ANTRAL BIOPSIES; ESOPHAGEAL BRUSHING;  Surgeon: Danie Binder, MD;  Location: [facility];  Service: Endoscopy;;  . Esophagogastroduodenoscopy (egd) with propofol N/A 09/11/2013    Dr. [name]: abnormal hypopharynx, question massively enlarged tonsils, stomach full of food precluded exam, needs EGD for verification of ulcer healing at a later date    Medications: Prior to Admission medications   Medication Sig Start Date End Date Taking? Authorizing Provider  bismuth subsalicylate (PEPTO BISMOL) 262 MG/15ML suspension Take 30 mLs by mouth every 6 (six) hours as needed for indigestion or diarrhea or loose stools.   Yes Historical Provider, MD  gabapentin (NEURONTIN) 800 MG tablet Take 800 mg by mouth 3 (three) times daily.   Yes Historical Provider, MD  insulin NPH-regular Human (NOVOLIN 70/30) (70-30) 100 UNIT/ML injection Inject 20 Units into the skin 2 (two) times daily with a meal. 15 units in the morning and 25 at night. 11/17/13  Yes Kathie Dike, MD  insulin regular (NOVOLIN R,HUMULIN R) 100 units/mL injection Inject 2-10 Units into the skin 3 (three) times daily before meals. Sliding scale.   Yes Historical Provider, MD  lisinopril (PRINIVIL,ZESTRIL) 20 MG tablet Take 20 mg by mouth daily.   Yes Historical Provider, MD  metoCLOPramide (REGLAN) 10 MG tablet Take 10 mg by mouth 4 (four) times daily -  before meals and at bedtime.   Yes Historical Provider, MD  ondansetron (ZOFRAN) 4 MG tablet Take 1 tablet (4 mg total) by mouth 3 (three) times daily with meals. 12/09/13  Yes Orvil Feil, NP  oxycodone (OXY-IR) 5 MG capsule Take 1 capsule (5 mg total) by mouth every 4 (four) hours as needed. 12/09/13  Yes Orvil Feil, NP  pantoprazole (PROTONIX) 40 MG tablet Take 1 tablet (40 mg total) by mouth 2 (two) times daily before a meal. 10/01/13  Yes Kathie Dike, MD  pravastatin (PRAVACHOL) 20 MG tablet  Take 20 mg by mouth daily.  09/10/13 09/10/14 Yes Historical Provider, MD  promethazine (PHENERGAN) 25 MG tablet Take 1 tablet (25 mg total) by mouth every 6 (six) hours as needed for nausea or vomiting. 12/09/13  Yes Orvil Feil, NP  tiZANidine (ZANAFLEX) 4 MG tablet Take 1 tablet by mouth every 6 (six) hours as needed for muscle spasms.  07/30/13  Yes Historical Provider, MD    Allergies:   Allergies  Allergen Reactions  . Hydrocodone Hives  . Ibuprofen Other (See Comments)    Stomach ulcers  . Naproxen Other (See Comments)    Stomach ulcers  . Sulfa Antibiotics Other (See Comments)    Stomach ulcers  . Tramadol   . Morphine And Related Rash    Social History:  reports that he has been smoking Cigarettes.  He has a 5 pack-year smoking history. He quit smokeless tobacco use about 10 months ago. He reports that he does not drink alcohol or use illicit drugs.  Family History: Family History  Problem Relation Age of Onset  . Cancer Father   . Alcohol abuse Father   . Pseudochol deficiency Neg Hx   . Malignant hyperthermia Neg Hx   . Hypotension Neg Hx   . Anesthesia problems Neg Hx   . Colon cancer Neg Hx     Physical Exam: Filed Vitals:   12/19/13 2336  BP: 171/102  Pulse: 115  Temp: 98.1 F (36.7 C)  TempSrc: Oral  Resp: 18  SpO2: 100%   General appearance: alert, cooperative and mild distress Head: Normocephalic, without obvious abnormality, atraumatic Eyes: negative Nose: Nares normal. Septum midline. Mucosa normal. No drainage or sinus tenderness. Neck: no JVD and supple, symmetrical, trachea midline Lungs: clear to auscultation bilaterally Heart: regular rate and rhythm, S1, S2 normal, no murmur, click, rub or gallop Abdomen: soft, non-tender; bowel sounds normal; no masses,  no organomegaly  ttp ruq nonacute abd Extremities: extremities normal, atraumatic, no cyanosis or edema Pulses: 2+ and symmetric Skin: Skin color, texture, turgor normal. No rashes or  lesions Neurologic: Grossly normal    Labs on Admission:   Recent Labs  12/20/13 0040  NA 139  K 3.7  CL 89*  CO2 28  GLUCOSE 354*  BUN 21  CREATININE 1.09  CALCIUM 9.7    Recent Labs  12/20/13 0040  AST 14  ALT 13  ALKPHOS 104  BILITOT 1.3*  PROT 8.0  ALBUMIN 4.5    Recent Labs  12/20/13 0040  LIPASE 7*    Recent Labs  12/20/13 0040  WBC 17.4*  NEUTROABS 14.9*  HGB 14.0  HCT 40.4  MCV 87.3  PLT 409*   Radiological Exams on Admission: US Abdomen Complete  11/26/2013   CLINICAL DATA:  Chronic abdominal pain, epigastric pain, nausea and vomiting  EXAM: ULTRASOUND ABDOMEN  COMPLETE  COMPARISON:  CT scan 11/11/2013  FINDINGS: Gallbladder: No shadowing gallstones are noted within gallbladder. Small gallbladder sludge. No thickening of gallbladder wall. No sonographic Murphy's sign.  Common bile duct: Diameter: 2.7 mm in diameter within normal limits.  Liver: No focal lesion identified. Within normal limits in parenchymal echogenicity. Liver measures 17 cm in length.  IVC: No abnormality visualized.  Pancreas: Visualized portion unremarkable.  Spleen: Size and appearance within normal limits. Measures 8 cm in length.  Right Kidney: Length: 12.2 cm. Echogenicity within normal limits. No mass or hydronephrosis visualized.  Left Kidney: Length: 13.2 cm. Echogenicity within normal limits. No mass or hydronephrosis visualized.  Abdominal aorta: No aneurysm visualized. Measures up to 1.9 cm in diameter.  Other findings: None.  IMPRESSION: 1. No shadowing gallstones are noted.  Small gallbladder sludge. 2. Normal CBD. 3. No hydronephrosis or renal calculus. 4. No aortic aneurysm.   Electronically Signed   By: Lahoma Crocker M.D.   On: 11/26/2013 10:54   Ct Abdomen Pelvis W Contrast  11/29/2013   CLINICAL DATA:  Patient fell approximate 12 feet off his porch onto his back this morning. Complaining of back pain. History of hypertension.  EXAM: CT CHEST, ABDOMEN, AND PELVIS WITH  CONTRAST  TECHNIQUE: Multidetector CT imaging of the chest, abdomen and pelvis was performed following the standard protocol during bolus administration of intravenous contrast.  CONTRAST:  161mL OMNIPAQUE IOHEXOL 300 MG/ML  SOLN  COMPARISON:  None.  FINDINGS: CT CHEST FINDINGS  Thoracic inlet is unremarkable. Cardiac silhouette is normal in size and configuration. Great vessels are normal in caliber. No mediastinal hematoma. No mediastinal or hilar masses or adenopathy. Clear lungs. No pleural effusion or pneumothorax  CT ABDOMEN AND PELVIS FINDINGS  Liver is mildly enlarged measuring 25 cm in greatest dimension. No liver mass or focal lesion. No liver contusion or laceration.  Normal spleen, gallbladder, pancreas, adrenal glands, kidneys, ureters and bladder.  No adenopathy.  No abnormal fluid collections.  No evidence of a bowel wall hematoma or mesenteric hematoma. Colon and small bowel are unremarkable.  MUSCULOSKELETAL:  No fractures.  IMPRESSION: 1. No acute injury to the chest, abdomen or pelvis. 2. Mild hepatomegaly of unclear etiology. 3. No other abnormalities.   Electronically Signed   By: Lajean Manes M.D.   On: 11/29/2013 09:21   Nm Hepato W/eject Fract  12/09/2013   CLINICAL DATA:  Right upper quadrant abdominal pain, nausea, vomiting for 7 months.  EXAM: NUCLEAR MEDICINE HEPATOBILIARY IMAGING WITH GALLBLADDER EF  TECHNIQUE: Sequential images of the abdomen were obtained out to 60 minutes following intravenous administration of radiopharmaceutical. After slow intravenous infusion of 1.68 micrograms Cholecystokinin, gallbladder ejection fraction was determined.  RADIOPHARMACEUTICALS:  5.0 Millicurie 123XX123 Choletec  COMPARISON:  CT 11/29/2013  FINDINGS: There is prompt uptake and excretion of radiotracer by the liver. No cystic duct or common bile duct obstruction. Gallbladder ejection fraction is 88%. At 30 min, normal ejection fraction is greater than 30%.  The patient did experience symptoms  during CCK infusion.  IMPRESSION: Normal gallbladder ejection fraction.  The patient experienced worsening abdominal pain and vomiting with CCK administration.   Electronically Signed   By: Rolm Baptise M.D.   On: 12/09/2013 09:34   Dg Abd Acute W/chest  12/20/2013   CLINICAL DATA:  Right upper quadrant pain  EXAM: ACUTE ABDOMEN SERIES (ABDOMEN 2 VIEW & CHEST 1 VIEW)  COMPARISON:  None.  FINDINGS: There is no evidence of dilated bowel loops or free intraperitoneal  air. No radiopaque calculi or other significant radiographic abnormality is seen. Heart size and mediastinal contours are within normal limits. Both lungs are clear.  IMPRESSION: Negative abdominal radiographs.  No acute cardiopulmonary disease.   Electronically Signed   By: Kathreen Devoid   On: 12/20/2013 00:36    Assessment/Plan  28 yo male with abd pain , n/v of unclear etiology possible gallbladder disease with mild dka  Principal Problem:   Intractable nausea and vomiting-  Will obtain gen surgery consult as inpt.  Unclear if this is his gallbladder.  Keep npo.  Place on zofran/dilaudid/reglan/protonix.  Exam is benign.  Active Problems:   DKA (diabetic ketoacidoses)-  Try to manage on telemetry overnight.  Last hgba1c over 11%.  Not clear with whats going on with his insurance ?   Uncontrolled diabetes mellitus   Abdominal pain, epigastric   Chronic generalized abdominal pain   Gastroparesis   PUD (peptic ulcer disease)  obs on tele. Full code.  Burke Terry A 12/20/2013, 2:06 AM

## 2013-12-20 NOTE — Consult Note (Signed)
Patient seen, chart reviewed.  Full consult to follow. 

## 2013-12-20 NOTE — Progress Notes (Signed)
The patient is a 28 year old with a history of type 1 diabetes mellitus, diabetic neuropathy, peptic ulcer disease, and diabetic gastroparesis, who was admitted this morning by Dr. Shanon Brow for acute on chronic abdominal pain, nausea, and vomiting. He was briefly seen and examined. His chart, vital signs, and laboratory studies were reviewed. Agree with management with exceptions/additions below:  1. Will change Protonix to IV every 12 hours. 2. Will make him completely nothing by mouth. 3. Will increase frequency IV Reglan to every 6 hours. 4. Will change basic metabolic panel to every 4 hours and change sliding scale NovoLog to every 4 hours. Continue to monitor for improvement or worsening DKA. 5. Discontinue oral lisinopril and add scheduled IV metoprolol and Catapres. 6. Blood cultures for low-grade fever. 7. Ultrasound of the abdomen.   Review of patient's chart reveals that he had a HIDA scan on 12/09/13 which was normal. General surgeon, Dr. Arnoldo Morale consulted.

## 2013-12-20 NOTE — ED Provider Notes (Signed)
CSN: [id number]     Arrival date & time 12/19/13  2330 History   First MD Initiated Contact with Patient 12/19/13 2344     Chief Complaint  Patient presents with  . Hyperglycemia      HPI Pt was seen at 2345. Per pt, c/o gradual onset and persistence of multiple intermittent episodes of N/V that began this morning. Endorses he has N/V "daily." Has been associated with acute flair of his chronic abd "pain."  Pt states he is due to see the General Surgeon regarding elective cholecystectomy in the next few weeks. Denies any change in his usual chronic recurrent symptoms. Pt states he has not taken his insulin 70/30 since yesterday night before bedtime, but did take his regular insulin 5 hours ago. EMS gave IV zofran en route without relief of N/V. Denies diarrhea, no CP/SOB, no back pain, no fevers, no black or blood in stools or emesis. The symptoms have been associated with no other complaints. The patient has a significant history of similar symptoms previously, recently being evaluated for this complaint and multiple prior evals for same.       Past Medical History  Diagnosis Date  . Diabetes mellitus     type 1  . Arthritis   . GERD (gastroesophageal reflux disease)   . PUD (peptic ulcer disease)   . Hypertension   . Heart murmur     valvular stenosis  . Neuropathy   . Diabetic gastroparesis associated with type 1 diabetes mellitus   . S/P arthroscopic knee surgery 07/04/2011  . Scoliosis 07/11/2012  . Chronic back pain   . Chronic abdominal pain   . Nausea and vomiting     chronic, recurrent   Past Surgical History  Procedure Laterality Date  . Tympanostomy tube placement    . Knee arthroscopy  06/30/2011    Procedure: ARTHROSCOPY KNEE;  Surgeon: Carole Civil, MD;  Location: [facility];  Service: Orthopedics;  Laterality: Right;  diagnostic arthroscopy  . Esophagogastroduodenoscopy (egd) with propofol  06/17/2013    Dr. Oneida Alar: two gastric ulcers in fundus, mild antral  gastritis, candida esophagitis  . Esophageal biopsy  06/17/2013    Procedure: GASTRIC ULCER AND ANTRAL BIOPSIES; ESOPHAGEAL BRUSHING;  Surgeon: Danie Binder, MD;  Location: [facility];  Service: Endoscopy;;  . Esophagogastroduodenoscopy (egd) with propofol N/A 09/11/2013    Dr. [name]: abnormal hypopharynx, question massively enlarged tonsils, stomach full of food precluded exam, needs EGD for verification of ulcer healing at a later date   Family History  Problem Relation Age of Onset  . Cancer Father   . Alcohol abuse Father   . Pseudochol deficiency Neg Hx   . Malignant hyperthermia Neg Hx   . Hypotension Neg Hx   . Anesthesia problems Neg Hx   . Colon cancer Neg Hx    History  Substance Use Topics  . Smoking status: Current Some Day Smoker -- 0.50 packs/day for 10 years    Types: Cigarettes    Last Attempt to Quit: 10/07/2013  . Smokeless tobacco: Former Systems developer    Quit date: 02/17/2013     Comment: smokes 4 cigarettes a day   . Alcohol Use: No    Review of Systems ROS: Statement: All systems negative except as marked or noted in the HPI; Constitutional: Negative for fever and chills. ; ; Eyes: Negative for eye pain, redness and discharge. ; ; ENMT: Negative for ear pain, hoarseness, nasal congestion, sinus pressure and sore throat. ; ; Cardiovascular:  Negative for chest pain, palpitations, diaphoresis, dyspnea and peripheral edema. ; ; Respiratory: Negative for cough, wheezing and stridor. ; ; Gastrointestinal: +N/V, abd pain. Negative for diarrhea, blood in stool, hematemesis, jaundice and rectal bleeding. . ; ; Genitourinary: Negative for dysuria, flank pain and hematuria. ; ; Musculoskeletal: Negative for back pain and neck pain. Negative for swelling and trauma.; ; Skin: Negative for pruritus, rash, abrasions, blisters, bruising and skin lesion.; ; Neuro: Negative for headache, lightheadedness and neck stiffness. Negative for weakness, altered level of consciousness , altered mental  status, extremity weakness, paresthesias, involuntary movement, seizure and syncope.      Allergies  Hydrocodone; Ibuprofen; Naproxen; Sulfa antibiotics; Tramadol; and Morphine and related  Home Medications   Prior to Admission medications   Medication Sig Start Date End Date Taking? Authorizing Provider  bismuth subsalicylate (PEPTO BISMOL) 262 MG/15ML suspension Take 30 mLs by mouth every 6 (six) hours as needed for indigestion or diarrhea or loose stools.   Yes Historical Provider, MD  gabapentin (NEURONTIN) 800 MG tablet Take 800 mg by mouth 3 (three) times daily.   Yes Historical Provider, MD  insulin NPH-regular Human (NOVOLIN 70/30) (70-30) 100 UNIT/ML injection Inject 20 Units into the skin 2 (two) times daily with a meal. 15 units in the morning and 25 at night. 11/17/13  Yes Kathie Dike, MD  insulin regular (NOVOLIN R,HUMULIN R) 100 units/mL injection Inject 2-10 Units into the skin 3 (three) times daily before meals. Sliding scale.   Yes Historical Provider, MD  lisinopril (PRINIVIL,ZESTRIL) 20 MG tablet Take 20 mg by mouth daily.   Yes Historical Provider, MD  metoCLOPramide (REGLAN) 10 MG tablet Take 10 mg by mouth 4 (four) times daily -  before meals and at bedtime.   Yes Historical Provider, MD  ondansetron (ZOFRAN) 4 MG tablet Take 1 tablet (4 mg total) by mouth 3 (three) times daily with meals. 12/09/13  Yes Orvil Feil, NP  oxycodone (OXY-IR) 5 MG capsule Take 1 capsule (5 mg total) by mouth every 4 (four) hours as needed. 12/09/13  Yes Orvil Feil, NP  pantoprazole (PROTONIX) 40 MG tablet Take 1 tablet (40 mg total) by mouth 2 (two) times daily before a meal. 10/01/13  Yes Kathie Dike, MD  pravastatin (PRAVACHOL) 20 MG tablet Take 20 mg by mouth daily.  09/10/13 09/10/14 Yes Historical Provider, MD  promethazine (PHENERGAN) 25 MG tablet Take 1 tablet (25 mg total) by mouth every 6 (six) hours as needed for nausea or vomiting. 12/09/13  Yes Orvil Feil, NP  tiZANidine (ZANAFLEX)  4 MG tablet Take 1 tablet by mouth every 6 (six) hours as needed for muscle spasms.  07/30/13  Yes Historical Provider, MD   BP 171/102 mmHg  Pulse 115  Temp(Src) 98.1 F (36.7 C) (Oral)  Resp 18  SpO2 100% Physical Exam  2350: Physical examination:  Nursing notes reviewed; Vital signs and O2 SAT reviewed;  Constitutional: Well developed, Well nourished, Well hydrated, +gagging and dry heaving during exam.; Head:  Normocephalic, atraumatic; Eyes: EOMI, PERRL, No scleral icterus; ENMT: Mouth and pharynx normal, Mucous membranes moist; Neck: Supple, Full range of motion, No lymphadenopathy; Cardiovascular: Regular rate and rhythm, No gallop; Respiratory: Breath sounds clear & equal bilaterally, No rales, rhonchi, wheezes.  Speaking full sentences with ease, Normal respiratory effort/excursion; Chest: Nontender, Movement normal; Abdomen: Soft, +mid-epigastric tenderness to palp. No rebound or guarding. Nondistended, Normal bowel sounds; Genitourinary: No CVA tenderness; Extremities: Pulses normal, No tenderness, No edema, No calf  edema or asymmetry.; Neuro: AA&Ox3, Major CN grossly intact.  Speech clear. No gross focal motor or sensory deficits in extremities.; Skin: Color normal, Warm, Dry.    ED Course  Procedures    2355:  Pt told the ED RN and myself that his symptoms "are my gallbladder" and "my gallbladder is bad." Pt reminded of the conversation he had with his GI provider (below). Pt then stated: "well, I need some pain medicines then." Pt also requesting "phenergan" for his N/V "because it's the only thing that works."  Will dose both while workup progresses.     Abdominal pain - Orvil Feil, NP at 12/11/2013 1:52 PM     Status: Written Related Problem: Abdominal pain   Expand All Collapse All   Likely multifactorial. Gallbladder remains in situ. [place] normal, subsequent HIDA with NORMAL EF at 88%; however, patient notes significant pain with CCK infusion. Will refer to Dr. Arnoldo Morale for  consideration of elective cholecystectomy; however, I am not convinced this will alleviate his chronic abdominal pain. Discussed this with the patient, informing him that even despite cholecystectomy, he may still be dealing with chronic abdominal pain. Will leave at the discretion of Dr. Arnoldo Morale.       0205:  Pt continues to c/o nausea and is unable to tol PO without N/V despite multiple doses of IV zofran and phenergan. IVF x2L given with CBG trending downward. AG 22; will dose SQ insulin.   T/C to [facility] Dr. Shanon Brow, case discussed, including:  HPI, pertinent PM/SHx, VS/PE, dx testing, ED course and treatment:  Agreeable to admit, requests to write temporary orders, obtain observation tele bed.      EKG Interpretation None      MDM  MDM Reviewed: previous chart, nursing note and vitals Reviewed previous: labs, ultrasound and CT scan Interpretation: labs and x-ray     Results for orders placed or performed during the hospital encounter of 12/19/13  Urinalysis, Routine w reflex microscopic  Result Value Ref Range   Color, Urine YELLOW YELLOW   APPearance CLEAR CLEAR   Specific Gravity, Urine 1.015 1.005 - 1.030   pH 5.5 5.0 - 8.0   Glucose, UA >1000 (A) NEGATIVE mg/dL   Hgb urine dipstick NEGATIVE NEGATIVE   Bilirubin Urine SMALL (A) NEGATIVE   Ketones, ur >80 (A) NEGATIVE mg/dL   Protein, ur NEGATIVE NEGATIVE mg/dL   Urobilinogen, UA 0.2 0.0 - 1.0 mg/dL   Nitrite NEGATIVE NEGATIVE   Leukocytes, UA NEGATIVE NEGATIVE  CBC with Differential  Result Value Ref Range   WBC 17.4 (H) 4.0 - 10.5 K/uL   RBC 4.63 4.22 - 5.81 MIL/uL   Hemoglobin 14.0 13.0 - 17.0 g/dL   HCT 40.4 39.0 - 52.0 %   MCV 87.3 78.0 - 100.0 fL   MCH 30.2 26.0 - 34.0 pg   MCHC 34.7 30.0 - 36.0 g/dL   RDW 14.3 11.5 - 15.5 %   Platelets 409 (H) 150 - 400 K/uL   Neutrophils Relative % 86 (H) 43 - 77 %   Neutro Abs 14.9 (H) 1.7 - 7.7 K/uL   Lymphocytes Relative 8 (L) 12 - 46 %   Lymphs Abs 1.4 0.7 - 4.0 K/uL    Monocytes Relative 6 3 - 12 %   Monocytes Absolute 1.0 0.1 - 1.0 K/uL   Eosinophils Relative 0 0 - 5 %   Eosinophils Absolute 0.0 0.0 - 0.7 K/uL   Basophils Relative 0 0 - 1 %   Basophils Absolute  0.0 0.0 - 0.1 K/uL  Comprehensive metabolic panel  Result Value Ref Range   Sodium 139 137 - 147 mEq/L   Potassium 3.7 3.7 - 5.3 mEq/L   Chloride 89 (L) 96 - 112 mEq/L   CO2 28 19 - 32 mEq/L   Glucose, Bld 354 (H) 70 - 99 mg/dL   BUN 21 6 - 23 mg/dL   Creatinine, Ser 1.09 0.50 - 1.35 mg/dL   Calcium 9.7 8.4 - 10.5 mg/dL   Total Protein 8.0 6.0 - 8.3 g/dL   Albumin 4.5 3.5 - 5.2 g/dL   AST 14 0 - 37 U/L   ALT 13 0 - 53 U/L   Alkaline Phosphatase 104 39 - 117 U/L   Total Bilirubin 1.3 (H) 0.3 - 1.2 mg/dL   GFR calc non Af Amer 90 (L) >90 mL/min   GFR calc Af Amer >90 >90 mL/min   Anion gap 22 (H) 5 - 15  Lipase, blood  Result Value Ref Range   Lipase 7 (L) 11 - 59 U/L  Urine microscopic-add on  Result Value Ref Range   Squamous Epithelial / LPF RARE RARE   WBC, UA 0-2 <3 WBC/hpf   RBC / HPF 0-2 <3 RBC/hpf   Bacteria, UA RARE RARE  CBG monitoring, ED  Result Value Ref Range   Glucose-Capillary 414 (H) 70 - 99 mg/dL   Comment 1 Documented in Chart    Comment 2 Notify RN   POC CBG, ED  Result Value Ref Range   Glucose-Capillary 296 (H) 70 - 99 mg/dL    Dg Abd Acute W/chest 12/20/2013   CLINICAL DATA:  Right upper quadrant pain  EXAM: ACUTE ABDOMEN SERIES (ABDOMEN 2 VIEW & CHEST 1 VIEW)  COMPARISON:  None.  FINDINGS: There is no evidence of dilated bowel loops or free intraperitoneal air. No radiopaque calculi or other significant radiographic abnormality is seen. Heart size and mediastinal contours are within normal limits. Both lungs are clear.  IMPRESSION: Negative abdominal radiographs.  No acute cardiopulmonary disease.   Electronically Signed   By: Kathreen Devoid   On: 12/20/2013 00:36      Francine Graven, DO 12/22/13 [id number]

## 2013-12-21 DIAGNOSIS — E1010 Type 1 diabetes mellitus with ketoacidosis without coma: Secondary | ICD-10-CM

## 2013-12-21 DIAGNOSIS — E1043 Type 1 diabetes mellitus with diabetic autonomic (poly)neuropathy: Secondary | ICD-10-CM

## 2013-12-21 LAB — CBC
HEMATOCRIT: 38.4 % — AB (ref 39.0–52.0)
HEMOGLOBIN: 13.1 g/dL (ref 13.0–17.0)
MCH: 30.6 pg (ref 26.0–34.0)
MCHC: 34.1 g/dL (ref 30.0–36.0)
MCV: 89.7 fL (ref 78.0–100.0)
Platelets: 373 10*3/uL (ref 150–400)
RBC: 4.28 MIL/uL (ref 4.22–5.81)
RDW: 14.5 % (ref 11.5–15.5)
WBC: 18.9 10*3/uL — ABNORMAL HIGH (ref 4.0–10.5)

## 2013-12-21 LAB — BASIC METABOLIC PANEL
Anion gap: 18 — ABNORMAL HIGH (ref 5–15)
Anion gap: 19 — ABNORMAL HIGH (ref 5–15)
Anion gap: 20 — ABNORMAL HIGH (ref 5–15)
Anion gap: 23 — ABNORMAL HIGH (ref 5–15)
Anion gap: 26 — ABNORMAL HIGH (ref 5–15)
BUN: 10 mg/dL (ref 6–23)
BUN: 11 mg/dL (ref 6–23)
BUN: 12 mg/dL (ref 6–23)
BUN: 13 mg/dL (ref 6–23)
BUN: 14 mg/dL (ref 6–23)
BUN: 9 mg/dL (ref 6–23)
CALCIUM: 9.3 mg/dL (ref 8.4–10.5)
CALCIUM: 9.5 mg/dL (ref 8.4–10.5)
CHLORIDE: 93 meq/L — AB (ref 96–112)
CO2: 19 mEq/L (ref 19–32)
CO2: 20 mEq/L (ref 19–32)
CO2: 17 mEq/L — ABNORMAL LOW (ref 19–32)
CO2: 17 meq/L — AB (ref 19–32)
CO2: 22 mEq/L (ref 19–32)
CO2: 21 mEq/L (ref 19–32)
CREATININE: 0.71 mg/dL (ref 0.50–1.35)
Calcium: 9.5 mg/dL (ref 8.4–10.5)
Calcium: 9.6 mg/dL (ref 8.4–10.5)
Calcium: 9.3 mg/dL (ref 8.4–10.5)
Calcium: 9.4 mg/dL (ref 8.4–10.5)
Chloride: 90 mEq/L — ABNORMAL LOW (ref 96–112)
Chloride: 93 mEq/L — ABNORMAL LOW (ref 96–112)
Chloride: 94 mEq/L — ABNORMAL LOW (ref 96–112)
Chloride: 94 mEq/L — ABNORMAL LOW (ref 96–112)
Chloride: 95 mEq/L — ABNORMAL LOW (ref 96–112)
Creatinine, Ser: 0.75 mg/dL (ref 0.50–1.35)
Creatinine, Ser: 0.75 mg/dL (ref 0.50–1.35)
Creatinine, Ser: 0.76 mg/dL (ref 0.50–1.35)
Creatinine, Ser: 0.8 mg/dL (ref 0.50–1.35)
Creatinine, Ser: 0.82 mg/dL (ref 0.50–1.35)
GFR calc Af Amer: >90 mL/min (ref >90)
GFR calc Af Amer: >90 mL/min (ref >90)
GFR calc Af Amer: >90 mL/min (ref >90)
GFR calc Af Amer: >90 mL/min (ref >90)
GFR calc Af Amer: >90 mL/min (ref >90)
GFR calc non Af Amer: >90 mL/min (ref >90)
GFR calc non Af Amer: >90 mL/min (ref >90)
GFR calc non Af Amer: >90 mL/min (ref >90)
GLUCOSE: 143 mg/dL — AB (ref 70–99)
GLUCOSE: 158 mg/dL — AB (ref 70–99)
GLUCOSE: 203 mg/dL — AB (ref 70–99)
GLUCOSE: 220 mg/dL — AB (ref 70–99)
GLUCOSE: 265 mg/dL — AB (ref 70–99)
Glucose, Bld: 140 mg/dL — ABNORMAL HIGH (ref 70–99)
POTASSIUM: 4.1 meq/L (ref 3.7–5.3)
POTASSIUM: 4.3 meq/L (ref 3.7–5.3)
Potassium: 4 mEq/L (ref 3.7–5.3)
Potassium: 4.1 mEq/L (ref 3.7–5.3)
Potassium: 4.3 mEq/L (ref 3.7–5.3)
Potassium: 4.4 mEq/L (ref 3.7–5.3)
SODIUM: 133 meq/L — AB (ref 137–147)
Sodium: 133 mEq/L — ABNORMAL LOW (ref 137–147)
Sodium: 134 mEq/L — ABNORMAL LOW (ref 137–147)
Sodium: 133 mEq/L — ABNORMAL LOW (ref 137–147)
Sodium: 135 mEq/L — ABNORMAL LOW (ref 137–147)
Sodium: 134 mEq/L — ABNORMAL LOW (ref 137–147)

## 2013-12-21 LAB — GLUCOSE, CAPILLARY
GLUCOSE-CAPILLARY: 152 mg/dL — AB (ref 70–99)
GLUCOSE-CAPILLARY: 287 mg/dL — AB (ref 70–99)
GLUCOSE-CAPILLARY: 236 mg/dL — AB (ref 70–99)
GLUCOSE-CAPILLARY: 140 mg/dL — AB (ref 70–99)
GLUCOSE-CAPILLARY: 163 mg/dL — AB (ref 70–99)
GLUCOSE-CAPILLARY: 211 mg/dL — AB (ref 70–99)
GLUCOSE-CAPILLARY: 179 mg/dL — AB (ref 70–99)
Glucose-Capillary: 131 mg/dL — ABNORMAL HIGH (ref 70–99)
Glucose-Capillary: 143 mg/dL — ABNORMAL HIGH (ref 70–99)
Glucose-Capillary: 144 mg/dL — ABNORMAL HIGH (ref 70–99)
Glucose-Capillary: 149 mg/dL — ABNORMAL HIGH (ref 70–99)
Glucose-Capillary: 151 mg/dL — ABNORMAL HIGH (ref 70–99)
Glucose-Capillary: 158 mg/dL — ABNORMAL HIGH (ref 70–99)
Glucose-Capillary: 174 mg/dL — ABNORMAL HIGH (ref 70–99)
Glucose-Capillary: 210 mg/dL — ABNORMAL HIGH (ref 70–99)
Glucose-Capillary: 218 mg/dL — ABNORMAL HIGH (ref 70–99)
Glucose-Capillary: 262 mg/dL — ABNORMAL HIGH (ref 70–99)
Glucose-Capillary: 270 mg/dL — ABNORMAL HIGH (ref 70–99)

## 2013-12-21 LAB — MRSA PCR SCREENING: MRSA by PCR: NEGATIVE

## 2013-12-21 LAB — HEPATIC FUNCTION PANEL
ALT: 12 U/L (ref 0–53)
AST: 14 U/L (ref 0–37)
Albumin: 4.2 g/dL (ref 3.5–5.2)
Alkaline Phosphatase: 100 U/L (ref 39–117)
BILIRUBIN DIRECT: 0.2 mg/dL (ref 0.0–0.3)
Indirect Bilirubin: 1.3 mg/dL — ABNORMAL HIGH (ref 0.3–0.9)
TOTAL PROTEIN: 7.5 g/dL (ref 6.0–8.3)
Total Bilirubin: 1.5 mg/dL — ABNORMAL HIGH (ref 0.3–1.2)

## 2013-12-21 MED ORDER — INSULIN REGULAR HUMAN 100 UNIT/ML IJ SOLN
INTRAMUSCULAR | Status: DC
  Administered 2013-12-21: 2.3 [IU]/h via INTRAVENOUS
  Filled 2013-12-21: qty 2.5

## 2013-12-21 MED ORDER — ACETAMINOPHEN 325 MG PO TABS
650.0000 mg | ORAL_TABLET | Freq: Four times a day (QID) | ORAL | Status: DC | PRN
  Administered 2013-12-21: 650 mg via ORAL
  Filled 2013-12-21: qty 2

## 2013-12-21 MED ORDER — DEXTROSE 50 % IV SOLN
INTRAVENOUS | Status: AC
  Filled 2013-12-21: qty 50

## 2013-12-21 MED ORDER — DEXTROSE 50 % IV SOLN: 25.0000 mL | INTRAVENOUS | Status: DC | PRN

## 2013-12-21 MED ORDER — DEXTROSE 50 % IV SOLN
1.0000 | Freq: Once | INTRAVENOUS | Status: AC
  Administered 2013-12-21: 50 mL via INTRAVENOUS

## 2013-12-21 MED ORDER — DEXTROSE-NACL 5-0.45 % IV SOLN: INTRAVENOUS | Status: DC

## 2013-12-21 MED ORDER — DEXTROSE-NACL 5-0.45 % IV SOLN
INTRAVENOUS | Status: DC
  Administered 2013-12-21: 1000 mL via INTRAVENOUS
  Administered 2013-12-21 – 2013-12-22 (×2): via INTRAVENOUS

## 2013-12-21 MED ORDER — SODIUM CHLORIDE 0.9 % IV SOLN
INTRAVENOUS | Status: DC
  Administered 2013-12-21: 10:00:00 via INTRAVENOUS

## 2013-12-21 NOTE — Progress Notes (Signed)
Pt transferred to floor from unit 300. Pt alert and oriented x4. VS stable, will continue to monitor.

## 2013-12-21 NOTE — Plan of Care (Signed)
Problem: Phase I Progression Outcomes Goal: Hemodynamically stable Outcome: Progressing     

## 2013-12-21 NOTE — Progress Notes (Signed)
Subjective: Patient still having nonspecific abdominal pain. He was post to see me my office in one week for elective cholecystectomy.  Objective: Vital signs in last 24 hours: Temp:  [98.2 F (36.8 C)-99.5 F (37.5 C)] 98.5 F (36.9 C) (11/15 0510) Pulse Rate:  [100-123] 108 (11/15 0510) Resp:  [20] 20 (11/15 0510) BP: (130-157)/(71-93) 145/93 mmHg (11/15 0510) SpO2:  [95 %-100 %] 100 % (11/15 0510) Last BM Date: 12/18/13  Intake/Output from previous day:   Intake/Output this shift:    General appearance: alert, cooperative and no distress GI: soft with nonspecific tenderness in the upper abdomen. No rigidity is noted.  Lab Results:   Recent Labs  12/20/13 0559 12/21/13 0456  WBC 22.3* 18.9*  HGB 13.2 13.1  HCT 38.8* 38.4*  PLT 418* 373   BMET  Recent Labs  12/21/13 0456 12/21/13 0817  NA 133* 134*  K 4.1 4.3  CL 93* 94*  CO2 19 20  GLUCOSE 140* 158*  BUN 13 12  CREATININE 0.75 0.76  CALCIUM 9.5 9.6   PT/INR No results for input(s): LABPROT, INR in the last 72 hours.  Studies/Results: US Abdomen Complete  12/20/2013   CLINICAL DATA:  Abdominal pain.  EXAM: ULTRASOUND ABDOMEN COMPLETE  COMPARISON:  Nuclear medicine gallbladder study 12/09/2013. CT of the abdomen and pelvis 11/29/2013. Acute abdominal series from the same day. Gallbladder wall thickness is 1.6 mm, within normal limits.  FINDINGS: Gallbladder: No gallstones or wall thickening visualized. The gallbladder is mildly distended. The gallbladder wall thickness is within normal limits at 1.6 mm. The patient did exhibit a sonographic Murphy's sign with pressure over the gallbladder.  Common bile duct: Diameter: 3.1 mm, within normal limits  Liver: The liver is mildly enlarged, measuring up to 20.8 cm in length. This corresponds with the prior CT scan and may represent a Riedel lobe. No focal lesions are present.  IVC: No abnormality visualized.  Pancreas: The pancreas is poorly visualized due to  overlying bowel gas. The visualized portions are within normal limits.  Spleen: The spleen is of normal size and echotexture, measuring 9.0 cm maximally, within normal limits  Right Kidney: Length: 12.6 cm, within normal limits. Echogenicity within normal limits. No mass or hydronephrosis visualized.  Left Kidney: Length: 13.0 cm, within normal limits. Echogenicity within normal limits. No mass or hydronephrosis visualized.  Abdominal aorta: No aneurysm visualized.  Other findings: None.  IMPRESSION: 1. Although there are no stones or gallbladder wall thickening, the patient is tender with pressure over the gallbladder. This is nonspecific but could suggest inflammation of the gallbladder. 2. Otherwise negative abdominal ultrasound.   Electronically Signed   By: Lawrence Santiago M.D.   On: 12/20/2013 12:10   Dg Abd Acute W/chest  12/20/2013   CLINICAL DATA:  Right upper quadrant pain  EXAM: ACUTE ABDOMEN SERIES (ABDOMEN 2 VIEW & CHEST 1 VIEW)  COMPARISON:  None.  FINDINGS: There is no evidence of dilated bowel loops or free intraperitoneal air. No radiopaque calculi or other significant radiographic abnormality is seen. Heart size and mediastinal contours are within normal limits. Both lungs are clear.  IMPRESSION: Negative abdominal radiographs.  No acute cardiopulmonary disease.   Electronically Signed   By: Kathreen Devoid   On: 12/20/2013 00:36    Anti-infectives: Anti-infectives    None      Assessment/Plan: Impression: Patient currently in DKA. Ultrasounds gallbladder was negative. His liver enzyme tests are within normal limits. No need for acute surgical intervention at this time.  After discussion with the patient, we have decided to delay surgery and discuss it at his scheduled outpatient visit with me. Will follow peripherally with you.  LOS: 2 days    Jeff Frieden A 12/21/2013

## 2013-12-21 NOTE — Progress Notes (Signed)
[facility] PROGRESS NOTE  Brent Daniels [id number] DOB: 11-Nov-1985 DOA: 12/19/2013 PCP: [name], MD    Code Status: full code Family Communication: family not available; discussed with patient Disposition Plan: discharge when clinically appropriate   Consultants:  Gen. Surgery, Dr. Arnoldo Morale  Procedures:  none  Antibiotics:  none  HPI/Subjective: Patient still complains of moderate to severe epigastric/right upper quadrant pain. IV hydromorphone is helping. He continues to have nausea and vomiting with witnessed green colored vomitus in the basin.  Objective: Filed Vitals:   12/21/13 1430  BP: 133/91  Pulse: 104  Temp:   Resp: 19  oxygen saturation 100%.  Intake/Output Summary (Last 24 hours) at 12/21/13 1524 Last data filed at 12/21/13 1300  Gross per 24 hour  Intake 3223.81 ml  Output    700 ml  Net 2523.81 ml   Filed Weights   12/20/13 0617  Weight: 77.066 kg (169 lb 14.4 oz)    Exam:   General: ill-appearing 28 year old Caucasian man sitting up in bed.  Cardiovascular: S1, S2, with mild tachycardia.  Respiratory: occasional wheezes auscultated.  Abdomen: hypoactive bowel sounds, soft, moderately tender in the epigastrium and right upper quadrant. No masses palpated.  Musculoskeletal: no acute hot red joints. No pedal edema.   Data Reviewed: Basic Metabolic Panel:  Recent Labs Lab 12/20/13 1948 12/20/13 2348 12/21/13 0456 12/21/13 0817 12/21/13 1120  NA 136* 133* 133* 134* 133*  K 4.4 4.4 4.1 4.3 4.3  CL 93* 90* 93* 94* 93*  CO2 21 17* 19 20 17*  GLUCOSE 246* 265* 140* 158* 220*  BUN 15 14 13 12 11   CREATININE 0.79 0.80 0.75 0.76 0.82  CALCIUM 9.3 9.3 9.5 9.6 9.5   Liver Function Tests:  Recent Labs Lab 12/20/13 0040 12/20/13 0441 12/21/13 0456  AST 14 13 14   ALT 13 11 12   ALKPHOS 104 94 100  BILITOT 1.3* 1.1 1.5*  PROT 8.0 7.5 7.5  ALBUMIN 4.5 4.1 4.2    Recent Labs Lab 12/20/13 0040  LIPASE 7*    No results for input(s): AMMONIA in the last 168 hours. CBC:  Recent Labs Lab 12/20/13 0040 12/20/13 0441 12/20/13 0559 12/21/13 0456  WBC 17.4* 21.8* 22.3* 18.9*  NEUTROABS 14.9*  --   --   --   HGB 14.0 13.3 13.2 13.1  HCT 40.4 38.1* 38.8* 38.4*  MCV 87.3 87.8 89.2 89.7  PLT 409* 395 418* 373   Cardiac Enzymes: No results for input(s): CKTOTAL, CKMB, CKMBINDEX, TROPONINI in the last 168 hours. BNP (last 3 results) No results for input(s): PROBNP in the last 8760 hours. CBG:  Recent Labs Lab 12/21/13 1031 12/21/13 1130 12/21/13 1234 12/21/13 1332 12/21/13 1446  GLUCAP 236* 218* 151* 144* 131*    Recent Results (from the past 240 hour(s))  Culture, blood (routine x 2)     Status: None (Preliminary result)   Collection Time: 12/20/13 10:37 AM  Result Value Ref Range Status   Specimen Description RIGHT ANTECUBITAL  Final   Special Requests BOTTLES DRAWN AEROBIC AND ANAEROBIC 8CC  Final   Culture NO GROWTH 1 DAY  Final   Report Status PENDING  Incomplete  Culture, blood (routine x 2)     Status: None (Preliminary result)   Collection Time: 12/20/13 10:37 AM  Result Value Ref Range Status   Specimen Description BLOOD RIGHT HAND  Final   Special Requests BOTTLES DRAWN AEROBIC AND ANAEROBIC 4CC  Final   Culture NO GROWTH 1 DAY  Final  Report Status PENDING  Incomplete  MRSA PCR Screening     Status: None   Collection Time: 12/21/13 11:14 AM  Result Value Ref Range Status   MRSA by PCR NEGATIVE NEGATIVE Final    Comment:        The GeneXpert MRSA Assay (FDA approved for NASAL specimens only), is one component of a comprehensive MRSA colonization surveillance program. It is not intended to diagnose MRSA infection nor to guide or monitor treatment for MRSA infections.      Studies: US Abdomen Complete  12/20/2013   CLINICAL DATA:  Abdominal pain.  EXAM: ULTRASOUND ABDOMEN COMPLETE  COMPARISON:  Nuclear medicine gallbladder study 12/09/2013. CT of the  abdomen and pelvis 11/29/2013. Acute abdominal series from the same day. Gallbladder wall thickness is 1.6 mm, within normal limits.  FINDINGS: Gallbladder: No gallstones or wall thickening visualized. The gallbladder is mildly distended. The gallbladder wall thickness is within normal limits at 1.6 mm. The patient did exhibit a sonographic Murphy's sign with pressure over the gallbladder.  Common bile duct: Diameter: 3.1 mm, within normal limits  Liver: The liver is mildly enlarged, measuring up to 20.8 cm in length. This corresponds with the prior CT scan and may represent a Riedel lobe. No focal lesions are present.  IVC: No abnormality visualized.  Pancreas: The pancreas is poorly visualized due to overlying bowel gas. The visualized portions are within normal limits.  Spleen: The spleen is of normal size and echotexture, measuring 9.0 cm maximally, within normal limits  Right Kidney: Length: 12.6 cm, within normal limits. Echogenicity within normal limits. No mass or hydronephrosis visualized.  Left Kidney: Length: 13.0 cm, within normal limits. Echogenicity within normal limits. No mass or hydronephrosis visualized.  Abdominal aorta: No aneurysm visualized.  Other findings: None.  IMPRESSION: 1. Although there are no stones or gallbladder wall thickening, the patient is tender with pressure over the gallbladder. This is nonspecific but could suggest inflammation of the gallbladder. 2. Otherwise negative abdominal ultrasound.   Electronically Signed   By: Lawrence Santiago M.D.   On: 12/20/2013 12:10   Dg Abd Acute W/chest  12/20/2013   CLINICAL DATA:  Right upper quadrant pain  EXAM: ACUTE ABDOMEN SERIES (ABDOMEN 2 VIEW & CHEST 1 VIEW)  COMPARISON:  None.  FINDINGS: There is no evidence of dilated bowel loops or free intraperitoneal air. No radiopaque calculi or other significant radiographic abnormality is seen. Heart size and mediastinal contours are within normal limits. Both lungs are clear.  IMPRESSION:  Negative abdominal radiographs.  No acute cardiopulmonary disease.   Electronically Signed   By: Kathreen Devoid   On: 12/20/2013 00:36    Scheduled Meds: . cloNIDine  0.1 mg Transdermal Weekly  . enoxaparin (LOVENOX) injection  40 mg Subcutaneous Q24H  . metoCLOPramide (REGLAN) injection  10 mg Intravenous Q6H  . metoprolol  5 mg Intravenous Q4H  . pantoprazole (PROTONIX) IV  40 mg Intravenous Q12H  . sodium chloride  3 mL Intravenous Q12H   Continuous Infusions: . dextrose 5 % and 0.45% NaCl 50 mL/hr at 12/21/13 1300  . insulin (NOVOLIN-R) infusion 1.4 Units/hr (12/21/13 1447)   Assessment and plan:  Principal Problem:   Intractable nausea and vomiting Active Problems:   Diabetic gastroparesis associated with type 1 diabetes mellitus   DKA, type 1   PUD (peptic ulcer disease)   Uncontrolled diabetes mellitus   Chronic generalized abdominal pain   Sinus tachycardia   1. Acute on chronic epigastric/right upper quadrant abdominal  pain with associated intractable nausea and vomiting. His symptomatology is likely secondary to recurrent diabetic gastroparesis with concomitant DKA. He may also have biliary colic and exacerbation of gastritis or peptic ulcer disease. -We'll continue IV fluid hydration, every 12 hours IV Protonix, every 6 hours IV Reglan. -Utrasound of the abdomen revealed no gallstones or gallbladder wall thickening, but the patient is tender over the gallbladder; otherwise negative ultrasound. -Lipase and liver transaminases were within normal limits, but total bilirubin was slightly elevated at 1.5. -Dr. Arnoldo Morale evaluated the patient and recommended outpatient evaluation for an elective cholecystectomy.  DKA in type 1 diabetes mellitus. The patient's anion gap widened, so the insulin drip protocol has been started. Will monitor his before meals metabolic panel every 4 hours and his CBGs every hour until his gap closes. Then we'll transition him to sliding scale NovoLog  and Lantus.  Sinus tachycardia. The patient has a history of sinus tachycardia associated with pain and DKA. Generally, it improves with supportive treatment.  Essential hypertension. His blood pressure has improved on IV metoprolol and Catapres patch.Lisinopril is on hold while he is nothing by mouth.  Leukocytosis. Etiology likely reactive from DKA. His urinalysis and chest x-ray revealed no evidence of infections. Blood cultures 2 are negative to date. We'll continue to monitor.   Time spent: 35 minutes    [facility] Pager [phone]. If 7PM-7AM, please contact night-coverage at [email or web address], password [facility] 12/21/2013, 3:24 PM  LOS: 2 days

## 2013-12-21 NOTE — Progress Notes (Signed)
Notified by MD that patient needed to be transferred to stepdown unit to be placed on insulin drip. Orders were placed. Report was called and given to Jadene Pierini, RN in ICU. Patient was transferred to [place]. Patient was alert and oriented and had no complaints at the time.

## 2013-12-22 LAB — GLUCOSE, CAPILLARY
GLUCOSE-CAPILLARY: 125 mg/dL — AB (ref 70–99)
GLUCOSE-CAPILLARY: 119 mg/dL — AB (ref 70–99)
GLUCOSE-CAPILLARY: 119 mg/dL — AB (ref 70–99)
GLUCOSE-CAPILLARY: 173 mg/dL — AB (ref 70–99)
GLUCOSE-CAPILLARY: 172 mg/dL — AB (ref 70–99)
GLUCOSE-CAPILLARY: 123 mg/dL — AB (ref 70–99)
GLUCOSE-CAPILLARY: 127 mg/dL — AB (ref 70–99)
Glucose-Capillary: 124 mg/dL — ABNORMAL HIGH (ref 70–99)
Glucose-Capillary: 125 mg/dL — ABNORMAL HIGH (ref 70–99)
Glucose-Capillary: 144 mg/dL — ABNORMAL HIGH (ref 70–99)
Glucose-Capillary: 144 mg/dL — ABNORMAL HIGH (ref 70–99)
Glucose-Capillary: 150 mg/dL — ABNORMAL HIGH (ref 70–99)
Glucose-Capillary: 164 mg/dL — ABNORMAL HIGH (ref 70–99)
Glucose-Capillary: 168 mg/dL — ABNORMAL HIGH (ref 70–99)
Glucose-Capillary: 176 mg/dL — ABNORMAL HIGH (ref 70–99)
Glucose-Capillary: 188 mg/dL — ABNORMAL HIGH (ref 70–99)
Glucose-Capillary: 197 mg/dL — ABNORMAL HIGH (ref 70–99)

## 2013-12-22 LAB — URINE CULTURE
COLONY COUNT: NO GROWTH
CULTURE: NO GROWTH

## 2013-12-22 LAB — BASIC METABOLIC PANEL
ANION GAP: 10 (ref 5–15)
ANION GAP: 11 (ref 5–15)
ANION GAP: 13 (ref 5–15)
ANION GAP: 13 (ref 5–15)
Anion gap: 14 (ref 5–15)
BUN: 8 mg/dL (ref 6–23)
BUN: 7 mg/dL (ref 6–23)
BUN: 5 mg/dL — AB (ref 6–23)
BUN: 7 mg/dL (ref 6–23)
BUN: 7 mg/dL (ref 6–23)
CALCIUM: 9.2 mg/dL (ref 8.4–10.5)
CHLORIDE: 97 meq/L (ref 96–112)
CHLORIDE: 96 meq/L (ref 96–112)
CHLORIDE: 96 meq/L (ref 96–112)
CO2: 25 mEq/L (ref 19–32)
CO2: 26 mEq/L (ref 19–32)
CO2: 25 mEq/L (ref 19–32)
CO2: 26 mEq/L (ref 19–32)
CO2: 27 mEq/L (ref 19–32)
CREATININE: 0.68 mg/dL (ref 0.50–1.35)
Calcium: 9.4 mg/dL (ref 8.4–10.5)
Calcium: 9.4 mg/dL (ref 8.4–10.5)
Calcium: 9.2 mg/dL (ref 8.4–10.5)
Calcium: 9.3 mg/dL (ref 8.4–10.5)
Chloride: 96 mEq/L (ref 96–112)
Chloride: 96 mEq/L (ref 96–112)
Creatinine, Ser: 0.66 mg/dL (ref 0.50–1.35)
Creatinine, Ser: 0.62 mg/dL (ref 0.50–1.35)
Creatinine, Ser: 0.68 mg/dL (ref 0.50–1.35)
Creatinine, Ser: 0.7 mg/dL (ref 0.50–1.35)
GFR calc Af Amer: >90 mL/min (ref >90)
GFR calc Af Amer: >90 mL/min (ref >90)
GFR calc Af Amer: >90 mL/min (ref >90)
GFR calc non Af Amer: >90 mL/min (ref >90)
GFR calc non Af Amer: >90 mL/min (ref >90)
GLUCOSE: 178 mg/dL — AB (ref 70–99)
Glucose, Bld: 124 mg/dL — ABNORMAL HIGH (ref 70–99)
Glucose, Bld: 121 mg/dL — ABNORMAL HIGH (ref 70–99)
Glucose, Bld: 187 mg/dL — ABNORMAL HIGH (ref 70–99)
Glucose, Bld: 129 mg/dL — ABNORMAL HIGH (ref 70–99)
POTASSIUM: 4 meq/L (ref 3.7–5.3)
Potassium: 3.3 mEq/L — ABNORMAL LOW (ref 3.7–5.3)
Potassium: 3.4 mEq/L — ABNORMAL LOW (ref 3.7–5.3)
Potassium: 3.8 mEq/L (ref 3.7–5.3)
Potassium: 3.9 mEq/L (ref 3.7–5.3)
SODIUM: 136 meq/L — AB (ref 137–147)
Sodium: 134 mEq/L — ABNORMAL LOW (ref 137–147)
Sodium: 135 mEq/L — ABNORMAL LOW (ref 137–147)
Sodium: 133 mEq/L — ABNORMAL LOW (ref 137–147)
Sodium: 133 mEq/L — ABNORMAL LOW (ref 137–147)

## 2013-12-22 LAB — CBC
HCT: 38 % — ABNORMAL LOW (ref 39.0–52.0)
HEMOGLOBIN: 13.2 g/dL (ref 13.0–17.0)
MCH: 30.6 pg (ref 26.0–34.0)
MCHC: 34.7 g/dL (ref 30.0–36.0)
MCV: 88.2 fL (ref 78.0–100.0)
Platelets: 314 10*3/uL (ref 150–400)
RBC: 4.31 MIL/uL (ref 4.22–5.81)
RDW: 14 % (ref 11.5–15.5)
WBC: 11 10*3/uL — ABNORMAL HIGH (ref 4.0–10.5)

## 2013-12-22 MED ORDER — INSULIN ASPART 100 UNIT/ML ~~LOC~~ SOLN: 0.0000 [IU] | SUBCUTANEOUS | Status: DC

## 2013-12-22 MED ORDER — METOCLOPRAMIDE HCL 5 MG/ML IJ SOLN
10.0000 mg | Freq: Three times a day (TID) | INTRAMUSCULAR | Status: DC
  Administered 2013-12-22 (×3): 10 mg via INTRAVENOUS
  Filled 2013-12-22 (×3): qty 2

## 2013-12-22 MED ORDER — INSULIN GLARGINE 100 UNIT/ML ~~LOC~~ SOLN
7.0000 [IU] | Freq: Two times a day (BID) | SUBCUTANEOUS | Status: DC
Start: 2013-12-22 — End: 2013-12-23
  Administered 2013-12-22: 7 [IU] via SUBCUTANEOUS
  Filled 2013-12-22 (×2): qty 0.07

## 2013-12-22 MED ORDER — INSULIN ASPART 100 UNIT/ML ~~LOC~~ SOLN
0.0000 [IU] | SUBCUTANEOUS | Status: DC
  Administered 2013-12-22 (×2): 1 [IU] via SUBCUTANEOUS
  Administered 2013-12-23: 2 [IU] via SUBCUTANEOUS

## 2013-12-22 MED ORDER — METOCLOPRAMIDE HCL 5 MG/ML IJ SOLN: 10.0000 mg | Freq: Three times a day (TID) | INTRAMUSCULAR | Status: DC

## 2013-12-22 MED ORDER — POTASSIUM CHLORIDE 2 MEQ/ML IV SOLN: INTRAVENOUS | Status: DC

## 2013-12-22 MED ORDER — KCL IN DEXTROSE-NACL 20-5-0.45 MEQ/L-%-% IV SOLN
INTRAVENOUS | Status: DC
  Administered 2013-12-22: 125 mL via INTRAVENOUS
  Administered 2013-12-22: 16:00:00 via INTRAVENOUS

## 2013-12-22 MED ORDER — ONDANSETRON HCL 4 MG/2ML IJ SOLN
4.0000 mg | Freq: Three times a day (TID) | INTRAMUSCULAR | Status: DC
  Administered 2013-12-22 (×3): 4 mg via INTRAVENOUS
  Filled 2013-12-22 (×3): qty 2

## 2013-12-22 MED ORDER — POTASSIUM CHLORIDE 10 MEQ/100ML IV SOLN
10.0000 meq | INTRAVENOUS | Status: AC
  Administered 2013-12-22 (×3): 10 meq via INTRAVENOUS
  Filled 2013-12-22 (×3): qty 100

## 2013-12-22 MED ORDER — INSULIN GLARGINE 100 UNIT/ML ~~LOC~~ SOLN
15.0000 [IU] | Freq: Two times a day (BID) | SUBCUTANEOUS | Status: DC
  Administered 2013-12-22: 15 [IU] via SUBCUTANEOUS
  Filled 2013-12-22 (×3): qty 0.15

## 2013-12-22 NOTE — Progress Notes (Addendum)
[facility] PROGRESS NOTE  Brent Daniels [id number] DOB: October 15, 1985 DOA: 12/19/2013 PCP: [name], MD    Code Status: full code Family Communication: family not available; discussed with patient Disposition Plan: discharge when clinically appropriate   Consultants:  Gen. Surgery, Dr. Arnoldo Morale  gastroenterology  Procedures:  none  Antibiotics:  none  HPI/Subjective: Patient still complains of epigastric and right upper quadrant abdominal pain, but slightly less compared to yesterday. No further nausea or vomiting. He is keeping noncaloric liquids down.  Objective: Filed Vitals:   12/22/13 0846  BP: 138/93  Pulse: 90  Temp:   Resp: 12  temperature 97.1. Oxygen saturation 99% on room air.  Intake/Output Summary (Last 24 hours) at 12/22/13 0930 Last data filed at 12/22/13 0657  Gross per 24 hour  Intake 5343.62 ml  Output   2200 ml  Net 3143.62 ml   Filed Weights   12/20/13 0617 12/22/13 0500  Weight: 77.066 kg (169 lb 14.4 oz) 77.1 kg (169 lb 15.6 oz)    Exam:   General: 28 year old Caucasian man sitting up in bed. He appears less ill.  Cardiovascular: S1, S2, with resolved tachycardia  Respiratory: occasional wheezes auscultated.  Abdomen: hypoactive bowel sounds, soft, moderately tender in the epigastrium and right upper quadrant. No masses palpated.  Musculoskeletal: no acute hot red joints. No pedal edema.   Data Reviewed: Basic Metabolic Panel:  Recent Labs Lab 12/21/13 1523 12/21/13 1951 12/22/13 0054 12/22/13 0336 12/22/13 0749  NA 135* 134* 134* 136* 135*  K 4.1 4.0 3.3* 3.4* 4.0  CL 95* 94* 96 97 96  CO2 22 21 25 26 25   GLUCOSE 143* 203* 124* 121* 187*  BUN 10 9 8 7 7   CREATININE 0.75 0.71 0.66 0.62 0.68  CALCIUM 9.3 9.4 9.4 9.4 9.2   Liver Function Tests:  Recent Labs Lab 12/20/13 0040 12/20/13 0441 12/21/13 0456  AST 14 13 14   ALT 13 11 12   ALKPHOS 104 94 100  BILITOT 1.3* 1.1 1.5*  PROT 8.0 7.5 7.5   ALBUMIN 4.5 4.1 4.2    Recent Labs Lab 12/20/13 0040  LIPASE 7*   No results for input(s): AMMONIA in the last 168 hours. CBC:  Recent Labs Lab 12/20/13 0040 12/20/13 0441 12/20/13 0559 12/21/13 0456 12/22/13 0336  WBC 17.4* 21.8* 22.3* 18.9* 11.0*  NEUTROABS 14.9*  --   --   --   --   HGB 14.0 13.3 13.2 13.1 13.2  HCT 40.4 38.1* 38.8* 38.4* 38.0*  MCV 87.3 87.8 89.2 89.7 88.2  PLT 409* 395 418* 373 314   Cardiac Enzymes: No results for input(s): CKTOTAL, CKMB, CKMBINDEX, TROPONINI in the last 168 hours. BNP (last 3 results) No results for input(s): PROBNP in the last 8760 hours. CBG:  Recent Labs Lab 12/22/13 0451 12/22/13 0555 12/22/13 0653 12/22/13 0745 12/22/13 0856  GLUCAP 144* 144* 164* 173* 197*    Recent Results (from the past 240 hour(s))  Urine culture     Status: None   Collection Time: 12/20/13 12:51 AM  Result Value Ref Range Status   Specimen Description URINE, RANDOM  Final   Special Requests NONE  Final   Culture  Setup Time   Final    12/20/2013 23:56 Performed at [place] Performed at [facility]   Final   Culture NO GROWTH Performed at [facility]   Final   Report Status 12/22/2013 FINAL  Final  Culture, blood (  routine x 2)     Status: None (Preliminary result)   Collection Time: 12/20/13 10:37 AM  Result Value Ref Range Status   Specimen Description RIGHT ANTECUBITAL  Final   Special Requests BOTTLES DRAWN AEROBIC AND ANAEROBIC 8CC  Final   Culture NO GROWTH 1 DAY  Final   Report Status PENDING  Incomplete  Culture, blood (routine x 2)     Status: None (Preliminary result)   Collection Time: 12/20/13 10:37 AM  Result Value Ref Range Status   Specimen Description BLOOD RIGHT HAND  Final   Special Requests BOTTLES DRAWN AEROBIC AND ANAEROBIC 4CC  Final   Culture NO GROWTH 1 DAY  Final   Report Status PENDING  Incomplete  MRSA PCR Screening     Status: None   Collection  Time: 12/21/13 11:14 AM  Result Value Ref Range Status   MRSA by PCR NEGATIVE NEGATIVE Final    Comment:        The GeneXpert MRSA Assay (FDA approved for NASAL specimens only), is one component of a comprehensive MRSA colonization surveillance program. It is not intended to diagnose MRSA infection nor to guide or monitor treatment for MRSA infections.      Studies: US Abdomen Complete  12/20/2013   CLINICAL DATA:  Abdominal pain.  EXAM: ULTRASOUND ABDOMEN COMPLETE  COMPARISON:  Nuclear medicine gallbladder study 12/09/2013. CT of the abdomen and pelvis 11/29/2013. Acute abdominal series from the same day. Gallbladder wall thickness is 1.6 mm, within normal limits.  FINDINGS: Gallbladder: No gallstones or wall thickening visualized. The gallbladder is mildly distended. The gallbladder wall thickness is within normal limits at 1.6 mm. The patient did exhibit a sonographic Murphy's sign with pressure over the gallbladder.  Common bile duct: Diameter: 3.1 mm, within normal limits  Liver: The liver is mildly enlarged, measuring up to 20.8 cm in length. This corresponds with the prior CT scan and may represent a Riedel lobe. No focal lesions are present.  IVC: No abnormality visualized.  Pancreas: The pancreas is poorly visualized due to overlying bowel gas. The visualized portions are within normal limits.  Spleen: The spleen is of normal size and echotexture, measuring 9.0 cm maximally, within normal limits  Right Kidney: Length: 12.6 cm, within normal limits. Echogenicity within normal limits. No mass or hydronephrosis visualized.  Left Kidney: Length: 13.0 cm, within normal limits. Echogenicity within normal limits. No mass or hydronephrosis visualized.  Abdominal aorta: No aneurysm visualized.  Other findings: None.  IMPRESSION: 1. Although there are no stones or gallbladder wall thickening, the patient is tender with pressure over the gallbladder. This is nonspecific but could suggest  inflammation of the gallbladder. 2. Otherwise negative abdominal ultrasound.   Electronically Signed   By: Lawrence Santiago M.D.   On: 12/20/2013 12:10    Scheduled Meds: . cloNIDine  0.1 mg Transdermal Weekly  . enoxaparin (LOVENOX) injection  40 mg Subcutaneous Q24H  . metoCLOPramide (REGLAN) injection  10 mg Intravenous TID WC & HS  . ondansetron (ZOFRAN) IV  4 mg Intravenous TID WC & HS  . pantoprazole (PROTONIX) IV  40 mg Intravenous Q12H  . potassium chloride  10 mEq Intravenous Q1 Hr x 3  . sodium chloride  3 mL Intravenous Q12H   Continuous Infusions: . dextrose 5 % and 0.45 % NaCl with KCl 20 mEq/L Stopped (12/22/13 0741)  . insulin (NOVOLIN-R) infusion 2.7 Units/hr (12/22/13 0858)   Assessment and plan:  Principal Problem:   Intractable nausea and vomiting Active  Problems:   Diabetic gastroparesis associated with type 1 diabetes mellitus   DKA, type 1   PUD (peptic ulcer disease)   Essential hypertension   Uncontrolled diabetes mellitus   Chronic generalized abdominal pain   Leukocytosis   Sinus tachycardia   1. Acute on chronic epigastric/right upper quadrant abdominal pain with associated intractable nausea and vomiting. Clinically improving slowly His symptomatology is likely secondary to recurrent diabetic gastroparesis with concomitant DKA. He may also have biliary colic and exacerbation of gastritis or peptic ulcer disease. We'll continue IV fluid hydration, every 12 hours IV Protonix, every 6 hours IV Reglan. Gastroenterology evaluated the patient and started IV Zofran-scheduled. EGD is being considered. Utrasound of the abdomen revealed no gallstones or gallbladder wall thickening, but the patient is tender over the gallbladder; otherwise negative ultrasound. Lipase and liver transaminases were within normal limits, but total bilirubin was slightly elevated at 1.5. General surgeon,Dr. Arnoldo Morale evaluated the patient and recommended outpatient evaluation for an  elective cholecystectomy.  DKA in type 1 diabetes mellitus. His anion gap is closing on start of the insulin drip protocol on 11/15. Increase rate of D5 half-normal saline as his blood glucose was in the 120s. Will monitor his before meals metabolic panel every 4 hours and his CBGs every hour until his gap closes. Then we'll transition him to sliding scale NovoLog and Lantus.  Sinus tachycardia. The patient has a history of sinus tachycardia associated with pain and DKA. Generally, it improves with supportive treatment. Now resolved.  Essential hypertension. His blood pressure has improved on IV metoprolol and Catapres patch.Lisinopril is on hold while he is nothing by mouth. Restart lisinopril when he is reliably taking by mouth liquids without nausea/vomiting.  Leukocytosis. Etiology likely reactive from DKA. His urinalysis and chest x-ray revealed no evidence of infections. Blood cultures 2 are negative to date. His white blood cell count has improved. We'll continue to monitor.   Time spent: 30 minutes    [place] [facility] Pager [phone]. If 7PM-7AM, please contact night-coverage at [email or web address], password [facility] 12/22/2013, 9:30 AM  LOS: 3 days

## 2013-12-22 NOTE — Progress Notes (Addendum)
Inpatient Diabetes Program Recommendations  AACE/ADA: New Consensus Statement on Inpatient Glycemic Control (2013)  Target Ranges:  Prepandial:   less than 140 mg/dL      Peak postprandial:   less than 180 mg/dL (1-2 hours)      Critically ill patients:  140 - 180 mg/dL   Results for WINTON, OFFORD (MRN [id number]) as of 12/22/2013 08:11  Ref. Range 12/21/2013 22:26 12/21/2013 23:18 12/22/2013 00:38 12/22/2013 01:26 12/22/2013 02:37 12/22/2013 03:46 12/22/2013 04:51 12/22/2013 05:55 12/22/2013 06:53 12/22/2013 07:45  Glucose-Capillary Latest Range: 70-99 mg/dL 179 (H) 149 (H) 150 (H) 125 (H) 119 (H) 119 (H) 144 (H) 144 (H) 164 (H) 173 (H)   Diabetes history: DM1 Outpatient Diabetes medications: 70/30 15 units QAM, 70/30 25 units QPM, Novolin R 2-10 units TID with meals Current orders for Inpatient glycemic control: Novolin R insulin drip via GlucoStabilizer  Inpatient Diabetes Program Recommendations Insulin - IV drip/GlucoStabilizer: Please consider transitioning from IV insulin drip to SQ insulin injections since criteria has been met (according to labs 12/22/13@ 3:36am CO2 26 and AG 13) and CBGs have been in target range for over 10 hours. Insulin - Basal: At time of transition from IV to SQ insulin, please consider ordering 70/30 20 units BID (if diet is resumed; if not then please consider ordering NPH 14 BID). Correction (SSI): At time of transition from IV to SQ insulin, please consider ordering Novolog sensitive correction scale.  Thanks, Barnie Alderman, RN, MSN, CCRN, CDE Diabetes Coordinator Inpatient Diabetes Program [phone] (Team Pager) [phone] ([facility] office) [phone] [facility] office)

## 2013-12-22 NOTE — Progress Notes (Deleted)
Went in with Dr. Sarajane Jews to talk with patient about complaints. Offered to restart IV and Nitroglycerine per doctors orders. Patient states that he does not want an IV restarted, he wants his discharge papers signed, and to go to another hospital. Reminded the patient that the doctor was not going to sign discharge orders because he did not recommend discharge with such a high blood pressure. Patient states he will be leaving. Expressed to patient to please let me know if he decides to leave for good or if he changes his mind and would like to me to restart his IV and medications. Patient refused.

## 2013-12-22 NOTE — Progress Notes (Addendum)
Subjective: Persistent abdominal pain, RUQ/Right-sided abdomen/RLQ. Nausea. States small amount hematemesis on admission. No further vomiting.   Objective: Vital signs in last 24 hours: Temp:  [97.1 F (36.2 C)-97.8 F (36.6 C)] 97.1 F (36.2 C) (11/16 0804) Pulse Rate:  [76-108] 90 (11/16 0846) Resp:  [10-25] 12 (11/16 0846) BP: (122-157)/(69-95) 138/93 mmHg (11/16 0846) SpO2:  [95 %-100 %] 99 % (11/16 0846) Weight:  [169 lb 15.6 oz (77.1 kg)] 169 lb 15.6 oz (77.1 kg) (11/16 0500) Last BM Date: 12/19/13 General:   Alert and oriented, pleasant Head:  Normocephalic and atraumatic. Abdomen:  Bowel sounds present, soft, TTP RUQ, right-sided abdomen, RLQ, no rebound, no HSM Neurologic:  Alert and  oriented x4;  grossly normal neurologically. Psych:  Alert and cooperative. Flat affect but pleasant   Intake/Output from previous day: 11/15 0701 - 11/16 0700 In: 5343.6 [I.V.:5343.6] Out: 2200 [Urine:2200] Intake/Output this shift:    Lab Results:  Recent Labs  12/20/13 0559 12/21/13 0456 12/22/13 0336  WBC 22.3* 18.9* 11.0*  HGB 13.2 13.1 13.2  HCT 38.8* 38.4* 38.0*  PLT 418* 373 314   BMET  Recent Labs  12/22/13 0054 12/22/13 0336 12/22/13 0749  NA 134* 136* 135*  K 3.3* 3.4* 4.0  CL 96 97 96  CO2 25 26 25   GLUCOSE 124* 121* 187*  BUN 8 7 7   CREATININE 0.66 0.62 0.68  CALCIUM 9.4 9.4 9.2   LFT  Recent Labs  12/20/13 0040 12/20/13 0441 12/21/13 0456  PROT 8.0 7.5 7.5  ALBUMIN 4.5 4.1 4.2  AST 14 13 14   ALT 13 11 12   ALKPHOS 104 94 100  BILITOT 1.3* 1.1 1.5*  BILIDIR  --   --  0.2  IBILI  --   --  1.3*     Studies/Results: US Abdomen Complete  12/20/2013   CLINICAL DATA:  Abdominal pain.  EXAM: ULTRASOUND ABDOMEN COMPLETE  COMPARISON:  Nuclear medicine gallbladder study 12/09/2013. CT of the abdomen and pelvis 11/29/2013. Acute abdominal series from the same day. Gallbladder wall thickness is 1.6 mm, within normal limits.  FINDINGS:  Gallbladder: No gallstones or wall thickening visualized. The gallbladder is mildly distended. The gallbladder wall thickness is within normal limits at 1.6 mm. The patient did exhibit a sonographic Murphy's sign with pressure over the gallbladder.  Common bile duct: Diameter: 3.1 mm, within normal limits  Liver: The liver is mildly enlarged, measuring up to 20.8 cm in length. This corresponds with the prior CT scan and may represent a Riedel lobe. No focal lesions are present.  IVC: No abnormality visualized.  Pancreas: The pancreas is poorly visualized due to overlying bowel gas. The visualized portions are within normal limits.  Spleen: The spleen is of normal size and echotexture, measuring 9.0 cm maximally, within normal limits  Right Kidney: Length: 12.6 cm, within normal limits. Echogenicity within normal limits. No mass or hydronephrosis visualized.  Left Kidney: Length: 13.0 cm, within normal limits. Echogenicity within normal limits. No mass or hydronephrosis visualized.  Abdominal aorta: No aneurysm visualized.  Other findings: None.  IMPRESSION: 1. Although there are no stones or gallbladder wall thickening, the patient is tender with pressure over the gallbladder. This is nonspecific but could suggest inflammation of the gallbladder. 2. Otherwise negative abdominal ultrasound.   Electronically Signed   By: Lawrence Santiago M.D.   On: 12/20/2013 12:10    Assessment: 28 year old male well-known to our practice from multiple prior admissions with history of PUD, likely diabetic gastroparesis,  admitted now with recurrent acute on chronic abdominal pain, N/V in the setting of DKA. Reports hematemesis on admission but none recently; abdominal pain persistent and likely multifactorial with known PUD, likely chronic abdominal pain. US/HIDA performed as outpatient with normal EF at 88% but significant pain with CCK infusion. As discussed in the outpatient setting, elective cholecystectomy could be considered  once surveillance EGD performed for PUD and ruling out further GI etiology. However, I did discuss with him as an outpatient that a cholecystectomy may not be indicated, as well as the possibility that even despite a cholecystectomy, he may still be dealing with chronic abdominal pain. Needs to keep appt with Dr. Arnoldo Morale as outpatient.   While inpatient, could proceed with EGD with Propofol, which has already been scheduled for 11/19; will discuss timing with Dr. [name]. Will need clear liquids for at least 2 days prior due to failed prior EGD in the setting of retained gastric contents.   Plan: PPI BID Reglan scheduled at mealtimes and evening Start Zofran IV scheduled qac and hs Sips of clears EGD with Propofol indicated in near future: will discuss timing with Dr. [name] Will hold Lovenox X 24 hours prior to EGD Keep outpatient appt with Dr. Arnoldo Morale Will continue to follow with you   Orvil Feil, ANP-BC [facility]     LOS: 3 days    12/22/2013, 8:50 AM   Attending note:  Patient seen and examined this afternoon in the ICU. Agree with the above assessment and recommendations. We'll plan for EGD tomorrow with deep sedation. Risks, benefits, limitations, alternatives and imponderables have been reviewed. Patient is agreeable.

## 2013-12-22 NOTE — Care Management Note (Addendum)
    Page 1 of 1   12/23/2013     1:32:25 PM CARE MANAGEMENT NOTE 12/23/2013  Patient:  Brent Daniels, Brent Daniels   Account Number:  [id number]  Date Initiated:  12/22/2013  Documentation initiated by:  Jolene Provost  Subjective/Objective Assessment:   Pt lives with mother. Pt has no HH serivces, medication needs or DME's prior to admission.     Action/Plan:   Pt plans to discharge home with self care. No CM needs identified at this time.   Anticipated DC Date:  12/23/2013   Anticipated DC Plan:  [place]  CM consult      Choice offered to / List presented to:             Status of service:  Completed, signed off Medicare Important Message given?   (If response is "NO", the following Medicare IM given date fields will be blank) Date Medicare IM given:   Medicare IM given by:   Date Additional Medicare IM given:   Additional Medicare IM given by:    Discharge Disposition:  HOME/SELF CARE  Per UR Regulation:    If discussed at Long Length of Stay Meetings, dates discussed:    Comments:  12/23/2013 [place], RN, MSN, PCCN Pt discharged home today with self care. No CM needs at the time of discahrge. 12/22/2013 [place], RN, MSN, [employer]

## 2013-12-23 ENCOUNTER — Inpatient Hospital Stay (HOSPITAL_COMMUNITY): Payer: Managed Care, Other (non HMO) | Admitting: Anesthesiology

## 2013-12-23 ENCOUNTER — Encounter (HOSPITAL_COMMUNITY)
Admission: EM | Disposition: A | Discharge status: Home / Self Care | Payer: Self-pay | Source: Home / Self Care | Attending: Internal Medicine

## 2013-12-23 ENCOUNTER — Encounter (HOSPITAL_COMMUNITY): Payer: Self-pay | Admitting: *Deleted

## 2013-12-23 DIAGNOSIS — K209 Esophagitis, unspecified without bleeding: Secondary | ICD-10-CM | POA: Diagnosis present

## 2013-12-23 DIAGNOSIS — K2210 Ulcer of esophagus without bleeding: Secondary | ICD-10-CM

## 2013-12-23 DIAGNOSIS — I10 Essential (primary) hypertension: Secondary | ICD-10-CM

## 2013-12-23 HISTORY — DX: Ulcer of esophagus without bleeding: K22.10

## 2013-12-23 HISTORY — PX: BIOPSY: SHX5522

## 2013-12-23 HISTORY — PX: ESOPHAGOGASTRODUODENOSCOPY (EGD) WITH PROPOFOL: SHX5813

## 2013-12-23 LAB — KOH PREP

## 2013-12-23 LAB — BASIC METABOLIC PANEL
ANION GAP: 11 (ref 5–15)
BUN: 4 mg/dL — ABNORMAL LOW (ref 6–23)
CALCIUM: 9.4 mg/dL (ref 8.4–10.5)
CHLORIDE: 97 meq/L (ref 96–112)
CO2: 29 mEq/L (ref 19–32)
Creatinine, Ser: 0.69 mg/dL (ref 0.50–1.35)
GFR calc Af Amer: >90 mL/min (ref >90)
GFR calc non Af Amer: >90 mL/min (ref >90)
Glucose, Bld: 194 mg/dL — ABNORMAL HIGH (ref 70–99)
POTASSIUM: 3.5 meq/L — AB (ref 3.7–5.3)
Sodium: 137 mEq/L (ref 137–147)

## 2013-12-23 LAB — GLUCOSE, CAPILLARY
Glucose-Capillary: 123 mg/dL — ABNORMAL HIGH (ref 70–99)
Glucose-Capillary: 132 mg/dL — ABNORMAL HIGH (ref 70–99)
Glucose-Capillary: 188 mg/dL — ABNORMAL HIGH (ref 70–99)

## 2013-12-23 LAB — CBC
HCT: 37.7 % — ABNORMAL LOW (ref 39.0–52.0)
HEMOGLOBIN: 13.1 g/dL (ref 13.0–17.0)
MCH: 30.5 pg (ref 26.0–34.0)
MCHC: 34.7 g/dL (ref 30.0–36.0)
MCV: 87.9 fL (ref 78.0–100.0)
Platelets: 291 10*3/uL (ref 150–400)
RBC: 4.29 MIL/uL (ref 4.22–5.81)
RDW: 13.9 % (ref 11.5–15.5)
WBC: 7.5 10*3/uL (ref 4.0–10.5)

## 2013-12-23 SURGERY — ESOPHAGOGASTRODUODENOSCOPY (EGD) WITH PROPOFOL
Anesthesia: Monitor Anesthesia Care

## 2013-12-23 MED ORDER — FENTANYL CITRATE 0.05 MG/ML IJ SOLN
INTRAMUSCULAR | Status: DC | PRN
  Administered 2013-12-23: 25 ug via INTRAVENOUS

## 2013-12-23 MED ORDER — SIMETHICONE 40 MG/0.6ML PO SUSP
ORAL | Status: AC
  Filled 2013-12-23: qty 0.6

## 2013-12-23 MED ORDER — MIDAZOLAM HCL 2 MG/2ML IJ SOLN
1.0000 mg | INTRAMUSCULAR | Status: DC | PRN
  Administered 2013-12-23 (×2): 2 mg via INTRAVENOUS
  Filled 2013-12-23: qty 2

## 2013-12-23 MED ORDER — MIDAZOLAM HCL 2 MG/2ML IJ SOLN
INTRAMUSCULAR | Status: AC
  Filled 2013-12-23: qty 2

## 2013-12-23 MED ORDER — ONDANSETRON HCL 4 MG/2ML IJ SOLN: 4.0000 mg | Freq: Once | INTRAMUSCULAR | Status: DC | PRN

## 2013-12-23 MED ORDER — SODIUM CHLORIDE 0.9 % IV SOLN: INTRAVENOUS | Status: DC

## 2013-12-23 MED ORDER — LACTATED RINGERS IV SOLN
INTRAVENOUS | Status: DC
  Administered 2013-12-23: 08:00:00 via INTRAVENOUS

## 2013-12-23 MED ORDER — PROPOFOL INFUSION 10 MG/ML OPTIME
INTRAVENOUS | Status: DC | PRN
  Administered 2013-12-23: 175 ug/kg/min via INTRAVENOUS

## 2013-12-23 MED ORDER — PROPOFOL 10 MG/ML IV BOLUS
INTRAVENOUS | Status: DC | PRN
  Administered 2013-12-23: 10 mg via INTRAVENOUS
  Administered 2013-12-23: 20 mg via INTRAVENOUS

## 2013-12-23 MED ORDER — LIDOCAINE VISCOUS 2 % MT SOLN
OROMUCOSAL | Status: AC
  Filled 2013-12-23: qty 15

## 2013-12-23 MED ORDER — FENTANYL CITRATE 0.05 MG/ML IJ SOLN
25.0000 ug | INTRAMUSCULAR | Status: DC
  Administered 2013-12-23 (×2): 25 ug via INTRAVENOUS

## 2013-12-23 MED ORDER — LIDOCAINE VISCOUS 2 % MT SOLN
3.0000 mL | Freq: Once | OROMUCOSAL | Status: AC
  Administered 2013-12-23: 3 mL via OROMUCOSAL
  Filled 2013-12-23: qty 5

## 2013-12-23 MED ORDER — FENTANYL CITRATE 0.05 MG/ML IJ SOLN: 25.0000 ug | INTRAMUSCULAR | Status: DC | PRN

## 2013-12-23 MED ORDER — FENTANYL CITRATE 0.05 MG/ML IJ SOLN
INTRAMUSCULAR | Status: AC
  Filled 2013-12-23: qty 2

## 2013-12-23 MED ORDER — SUCRALFATE 1 GM/10ML PO SUSP: 1.0000 g | Freq: Three times a day (TID) | ORAL | Status: DC

## 2013-12-23 SURGICAL SUPPLY — 22 items
BLOCK BITE 60FR ADLT L/F BLUE (MISCELLANEOUS) ×2 IMPLANT
BRUSH CYTO GASTROINTESTINAL (MISCELLANEOUS) ×2 IMPLANT
DEVICE CLIP HEMOSTAT 235CM (CLIP) IMPLANT
ELECT REM PT RETURN 9FT ADLT (ELECTROSURGICAL)
ELECTRODE REM PT RTRN 9FT ADLT (ELECTROSURGICAL) IMPLANT
FLOOR PAD 36X40 (MISCELLANEOUS) ×3
FORCEPS BIOP RAD 4 LRG CAP 4 (CUTTING FORCEPS) ×2 IMPLANT
FORMALIN 10 PREFIL 20ML (MISCELLANEOUS) IMPLANT
KIT CLEAN ENDO COMPLIANCE (KITS) ×2 IMPLANT
MANIFOLD NEPTUNE II (INSTRUMENTS) ×2 IMPLANT
NDL SCLEROTHERAPY 25GX240 (NEEDLE) IMPLANT
NEEDLE SCLEROTHERAPY 25GX240 (NEEDLE) IMPLANT
PAD FLOOR 36X40 (MISCELLANEOUS) IMPLANT
PROBE APC STR FIRE (PROBE) IMPLANT
PROBE INJECTION GOLD (MISCELLANEOUS)
PROBE INJECTION GOLD 7FR (MISCELLANEOUS) IMPLANT
SNARE ROTATE MED OVAL 20MM (MISCELLANEOUS) IMPLANT
SNARE SHORT THROW 13M SML OVAL (MISCELLANEOUS) ×1 IMPLANT
SYR 50ML LL SCALE MARK (SYRINGE) ×2 IMPLANT
SYR INFLATION 60ML (SYRINGE) ×1 IMPLANT
TUBING IRRIGATION ENDOGATOR (MISCELLANEOUS) ×2 IMPLANT
WATER STERILE IRR 1000ML POUR (IV SOLUTION) ×2 IMPLANT

## 2013-12-23 NOTE — Discharge Summary (Signed)
Physician Discharge Summary  Brent Daniels [id number] DOB: 23-Apr-1985 DOA: 12/19/2013  PCP: [name], MD  Admit date: 12/19/2013 Discharge date: 12/23/2013  Time spent: less than 30 minutes  Recommendations for Outpatient Follow-up:  1. The patient left AMA  Discharge Diagnoses:  1. The patient left AMA. 2. Severe for quadrant exudative erosive reflux esophagitis, per EGD, Dr. [name], 12/23/13. 3. Type 1 diabetes mellitus with DKA. 4. Diabetic gastroparesis associated with type 1 diabetes mellitus. 5. Acute on chronic abdominal pain with nausea and vomiting. Likely secondary to #2 and #4. 6. Essential hypertension. 7. Reactive leukocytosis. 8. Sinus tachycardia secondary to volume depletion and pain, resolved.  Discharge Condition: patient left AMA  Diet recommendation: patient left [facility]  Filed Weights   12/20/13 0617 12/22/13 0500 12/23/13 0500  Weight: 77.066 kg (169 lb 14.4 oz) 77.1 kg (169 lb 15.6 oz) 73.9 kg (162 lb 14.7 oz)    History of present illness:  The patient is a 28 year old with type 1 diabetes mellitus, diabetic asked paresis, and peptic ulcer disease, who presented to the emergency department on 12/19/13, with a complaint of recurrent abdominal pain, nausea, and vomiting. In the ED, he was hypertensive and mildly tachycardic. His glucose was 354, CO2 28, white blood cell count 17.4, a urinalysis negative for WBCs/bacteria. His anion gap was slightly elevated. His lipase and liver transaminases were within normal limits. He was admitted for further evaluation and management.  Hospital Course:   1. Acute on chronic epigastric/right upper quadrant abdominal pain with associated intractable nausea and vomiting.; in part secondary to erosive exudative reflux esophagitis. The patient was started on supportive treatment with IV fluid hydration, scheduled IV Reglan, scheduled IV Protonix, and as needed IV analgesics and antiemetics. For further evaluation,  number studies were ordered. His lipase and liver transaminases were within normal limits, but his total bilirubin was slightly elevated at 1.5. Ultrasound of his abdomen revealed no gallstones or gallbladder wall thickening, but the patient was tender over the gallbladder. General surgeon, Dr. Arnoldo Morale evaluated the patient and recommended outpatient evaluation for an elective cholecystectomy. Gastroenterology was consulted. Dr. [name] performed an EGD prior to the patient leaving AMA. The results of the EGD were dictated above and, in essence, revealed severe erosive exudative reflux esophagitis. The area was biopsied and brushed for KOH. He was started on a clear liquid diet and Carafate. However, the patient left AMA before he was seen by the dictating physician.  DKA in type 1 diabetes mellitus. He was started on the insulin drip protocol. When his anion gap closed, he was started on sliding scale NovoLog and Lantus. Vigorous IV fluids were given and adjusted per the insulin drip protocol.  Sinus tachycardia. The patient has a history of sinus tachycardia associated with pain and DKA. Generally, it improved with supportive treatment. It did resolve.  Essential hypertension. His lisinopril was placed on hold. His blood pressure was managed with IV metoprolol and Catapres patch. His blood pressure improved.His blood pressure has improved on IV metoprolol and Catapres patch.  Leukocytosis.  His urinalysis and chest x-ray revealed no evidence of infections. Blood cultures 2 were negative to date. His white blood cell count  Improved.  Patient left AMA. He was not seen prior to leaving. Was unclear why he left.   Procedures: EGD 12/23/13:  ENDOSCOPIC IMPRESSION: "Severe exudative esophagitis?"likely predominantly reflux related. Superimposed Candida infection not excluded?"status post KOH brushing and biopsy. Localized excoriating gastric mucosa most consistent with trauma(vomiting). No  evidence of peptic  ulcer disease or other gastric/duodenal pathology. I suspect severe inflammation involving the distal esophagus may account for at least some of patient's upper abdominal complaints."-per Dr. [name]  Consultations:  Gastroenterology  General surgery  Discharge Exam: Filed Vitals:   12/23/13 0815  BP: 124/77  Pulse: 112  Temp: 98.4 F (36.9 C)  Resp: 15      Discharge Instructions You were cared for by a hospitalist during your hospital stay. If you have any questions about your discharge medications or the care you received while you were in the hospital after you are discharged, you can call the unit and asked to speak with the hospitalist on call if the hospitalist that took care of you is not available. Once you are discharged, your primary care physician will handle any further medical issues. Please note that NO REFILLS for any discharge medications will be authorized once you are discharged, as it is imperative that you return to your primary care physician (or establish a relationship with a primary care physician if you do not have one) for your aftercare needs so that they can reassess your need for medications and monitor your lab values.   Discharge Medication List as of 12/23/2013 10:42 AM    CONTINUE these medications which have NOT CHANGED   Details  bismuth subsalicylate (PEPTO BISMOL) 262 MG/15ML suspension Take 30 mLs by mouth every 6 (six) hours as needed for indigestion or diarrhea or loose stools., Until Discontinued, Historical Med    gabapentin (NEURONTIN) 800 MG tablet Take 800 mg by mouth 3 (three) times daily., Until Discontinued, Historical Med    insulin NPH-regular Human (NOVOLIN 70/30) (70-30) 100 UNIT/ML injection Inject 20 Units into the skin 2 (two) times daily with a meal. 15 units in the morning and 25 at night., Starting 11/17/2013, Until Discontinued, Normal    insulin regular (NOVOLIN R,HUMULIN R) 100 units/mL injection  Inject 2-10 Units into the skin 3 (three) times daily before meals. Sliding scale., Until Discontinued, Historical Med    lisinopril (PRINIVIL,ZESTRIL) 20 MG tablet Take 20 mg by mouth daily., Until Discontinued, Historical Med    metoCLOPramide (REGLAN) 10 MG tablet Take 10 mg by mouth 4 (four) times daily -  before meals and at bedtime., Until Discontinued, Historical Med    ondansetron (ZOFRAN) 4 MG tablet Take 1 tablet (4 mg total) by mouth 3 (three) times daily with meals., Starting 12/09/2013, Until Discontinued, Normal    pantoprazole (PROTONIX) 40 MG tablet Take 1 tablet (40 mg total) by mouth 2 (two) times daily before a meal., Starting 10/01/2013, Until Discontinued, Print    pravastatin (PRAVACHOL) 20 MG tablet Take 20 mg by mouth daily. , Starting 09/10/2013, Until Thu 09/10/14, Historical Med    promethazine (PHENERGAN) 25 MG tablet Take 1 tablet (25 mg total) by mouth every 6 (six) hours as needed for nausea or vomiting., Starting 12/09/2013, Until Discontinued, Normal    tiZANidine (ZANAFLEX) 4 MG tablet Take 1 tablet by mouth every 6 (six) hours as needed for muscle spasms. , Starting 07/30/2013, Until Discontinued, Historical Med       Allergies  Allergen Reactions  . Hydrocodone Hives  . Ibuprofen Other (See Comments)    Stomach ulcers  . Naproxen Other (See Comments)    Stomach ulcers  . Sulfa Antibiotics Other (See Comments)    Stomach ulcers  . Tramadol   . Morphine And Related Rash      The results of significant diagnostics from this hospitalization (including imaging, microbiology,  ancillary and laboratory) are listed below for reference.    Significant Diagnostic Studies: Ct Head Wo Contrast  11/29/2013   CLINICAL DATA:  Patient fell approximate 12 feet onto his back this morning. Fell onto back and posterior head complaining of head and neck pain.  EXAM: CT HEAD WITHOUT CONTRAST  CT CERVICAL SPINE WITHOUT CONTRAST  TECHNIQUE: Multidetector CT imaging of the  head and cervical spine was performed following the standard protocol without intravenous contrast. Multiplanar CT image reconstructions of the cervical spine were also generated.  COMPARISON:  None.  FINDINGS: CT HEAD FINDINGS  Ventricles are normal in configuration. There is mild ventricular and sulcal enlargement reflecting volume loss greater than generally seen in a patient of this age.  No parenchymal masses or mass effect. There is no evidence of an infarct. There are no extra-axial masses or abnormal fluid collections.  No intracranial hemorrhage.  No skull fracture.  There is mild ethmoid sinus mucosal thickening.  CT CERVICAL SPINE FINDINGS  No fracture. No spondylolisthesis. There are no significant degenerative changes. Soft tissues are unremarkable. Lung apices are clear.  IMPRESSION: HEAD CT: No acute intracranial abnormalities. Volume loss greater than expected for this patient's age.  CERVICAL CT:  Normal.   Electronically Signed   By: Lajean Manes M.D.   On: 11/29/2013 09:14   Ct Chest W Contrast  11/29/2013   CLINICAL DATA:  Patient fell approximate 12 feet off his porch onto his back this morning. Complaining of back pain. History of hypertension.  EXAM: CT CHEST, ABDOMEN, AND PELVIS WITH CONTRAST  TECHNIQUE: Multidetector CT imaging of the chest, abdomen and pelvis was performed following the standard protocol during bolus administration of intravenous contrast.  CONTRAST:  140mL OMNIPAQUE IOHEXOL 300 MG/ML  SOLN  COMPARISON:  None.  FINDINGS: CT CHEST FINDINGS  Thoracic inlet is unremarkable. Cardiac silhouette is normal in size and configuration. Great vessels are normal in caliber. No mediastinal hematoma. No mediastinal or hilar masses or adenopathy. Clear lungs. No pleural effusion or pneumothorax  CT ABDOMEN AND PELVIS FINDINGS  Liver is mildly enlarged measuring 25 cm in greatest dimension. No liver mass or focal lesion. No liver contusion or laceration.  Normal spleen, gallbladder,  pancreas, adrenal glands, kidneys, ureters and bladder.  No adenopathy.  No abnormal fluid collections.  No evidence of a bowel wall hematoma or mesenteric hematoma. Colon and small bowel are unremarkable.  MUSCULOSKELETAL:  No fractures.  IMPRESSION: 1. No acute injury to the chest, abdomen or pelvis. 2. Mild hepatomegaly of unclear etiology. 3. No other abnormalities.   Electronically Signed   By: Lajean Manes M.D.   On: 11/29/2013 09:21   Ct Cervical Spine Wo Contrast  11/29/2013   CLINICAL DATA:  Patient fell approximate 12 feet onto his back this morning. Fell onto back and posterior head complaining of head and neck pain.  EXAM: CT HEAD WITHOUT CONTRAST  CT CERVICAL SPINE WITHOUT CONTRAST  TECHNIQUE: Multidetector CT imaging of the head and cervical spine was performed following the standard protocol without intravenous contrast. Multiplanar CT image reconstructions of the cervical spine were also generated.  COMPARISON:  None.  FINDINGS: CT HEAD FINDINGS  Ventricles are normal in configuration. There is mild ventricular and sulcal enlargement reflecting volume loss greater than generally seen in a patient of this age.  No parenchymal masses or mass effect. There is no evidence of an infarct. There are no extra-axial masses or abnormal fluid collections.  No intracranial hemorrhage.  No skull fracture.  There is mild ethmoid sinus mucosal thickening.  CT CERVICAL SPINE FINDINGS  No fracture. No spondylolisthesis. There are no significant degenerative changes. Soft tissues are unremarkable. Lung apices are clear.  IMPRESSION: HEAD CT: No acute intracranial abnormalities. Volume loss greater than expected for this patient's age.  CERVICAL CT:  Normal.   Electronically Signed   By: Lajean Manes M.D.   On: 11/29/2013 09:14   US Abdomen Complete  12/20/2013   CLINICAL DATA:  Abdominal pain.  EXAM: ULTRASOUND ABDOMEN COMPLETE  COMPARISON:  Nuclear medicine gallbladder study 12/09/2013. CT of the abdomen  and pelvis 11/29/2013. Acute abdominal series from the same day. Gallbladder wall thickness is 1.6 mm, within normal limits.  FINDINGS: Gallbladder: No gallstones or wall thickening visualized. The gallbladder is mildly distended. The gallbladder wall thickness is within normal limits at 1.6 mm. The patient did exhibit a sonographic Murphy's sign with pressure over the gallbladder.  Common bile duct: Diameter: 3.1 mm, within normal limits  Liver: The liver is mildly enlarged, measuring up to 20.8 cm in length. This corresponds with the prior CT scan and may represent a Riedel lobe. No focal lesions are present.  IVC: No abnormality visualized.  Pancreas: The pancreas is poorly visualized due to overlying bowel gas. The visualized portions are within normal limits.  Spleen: The spleen is of normal size and echotexture, measuring 9.0 cm maximally, within normal limits  Right Kidney: Length: 12.6 cm, within normal limits. Echogenicity within normal limits. No mass or hydronephrosis visualized.  Left Kidney: Length: 13.0 cm, within normal limits. Echogenicity within normal limits. No mass or hydronephrosis visualized.  Abdominal aorta: No aneurysm visualized.  Other findings: None.  IMPRESSION: 1. Although there are no stones or gallbladder wall thickening, the patient is tender with pressure over the gallbladder. This is nonspecific but could suggest inflammation of the gallbladder. 2. Otherwise negative abdominal ultrasound.   Electronically Signed   By: Lawrence Santiago M.D.   On: 12/20/2013 12:10   US Abdomen Complete  11/26/2013   CLINICAL DATA:  Chronic abdominal pain, epigastric pain, nausea and vomiting  EXAM: ULTRASOUND ABDOMEN COMPLETE  COMPARISON:  CT scan 11/11/2013  FINDINGS: Gallbladder: No shadowing gallstones are noted within gallbladder. Small gallbladder sludge. No thickening of gallbladder wall. No sonographic Murphy's sign.  Common bile duct: Diameter: 2.7 mm in diameter within normal limits.   Liver: No focal lesion identified. Within normal limits in parenchymal echogenicity. Liver measures 17 cm in length.  IVC: No abnormality visualized.  Pancreas: Visualized portion unremarkable.  Spleen: Size and appearance within normal limits. Measures 8 cm in length.  Right Kidney: Length: 12.2 cm. Echogenicity within normal limits. No mass or hydronephrosis visualized.  Left Kidney: Length: 13.2 cm. Echogenicity within normal limits. No mass or hydronephrosis visualized.  Abdominal aorta: No aneurysm visualized. Measures up to 1.9 cm in diameter.  Other findings: None.  IMPRESSION: 1. No shadowing gallstones are noted.  Small gallbladder sludge. 2. Normal CBD. 3. No hydronephrosis or renal calculus. 4. No aortic aneurysm.   Electronically Signed   By: Lahoma Crocker M.D.   On: 11/26/2013 10:54   Ct Abdomen Pelvis W Contrast  11/29/2013   CLINICAL DATA:  Patient fell approximate 12 feet off his porch onto his back this morning. Complaining of back pain. History of hypertension.  EXAM: CT CHEST, ABDOMEN, AND PELVIS WITH CONTRAST  TECHNIQUE: Multidetector CT imaging of the chest, abdomen and pelvis was performed following the standard protocol during bolus administration of intravenous contrast.  CONTRAST:  134mL OMNIPAQUE IOHEXOL 300 MG/ML  SOLN  COMPARISON:  None.  FINDINGS: CT CHEST FINDINGS  Thoracic inlet is unremarkable. Cardiac silhouette is normal in size and configuration. Great vessels are normal in caliber. No mediastinal hematoma. No mediastinal or hilar masses or adenopathy. Clear lungs. No pleural effusion or pneumothorax  CT ABDOMEN AND PELVIS FINDINGS  Liver is mildly enlarged measuring 25 cm in greatest dimension. No liver mass or focal lesion. No liver contusion or laceration.  Normal spleen, gallbladder, pancreas, adrenal glands, kidneys, ureters and bladder.  No adenopathy.  No abnormal fluid collections.  No evidence of a bowel wall hematoma or mesenteric hematoma. Colon and small bowel are  unremarkable.  MUSCULOSKELETAL:  No fractures.  IMPRESSION: 1. No acute injury to the chest, abdomen or pelvis. 2. Mild hepatomegaly of unclear etiology. 3. No other abnormalities.   Electronically Signed   By: Lajean Manes M.D.   On: 11/29/2013 09:21   Nm Hepato W/eject Fract  12/09/2013   CLINICAL DATA:  Right upper quadrant abdominal pain, nausea, vomiting for 7 months.  EXAM: NUCLEAR MEDICINE HEPATOBILIARY IMAGING WITH GALLBLADDER EF  TECHNIQUE: Sequential images of the abdomen were obtained out to 60 minutes following intravenous administration of radiopharmaceutical. After slow intravenous infusion of 1.68 micrograms Cholecystokinin, gallbladder ejection fraction was determined.  RADIOPHARMACEUTICALS:  5.0 [place] 123XX123 Choletec  COMPARISON:  CT 11/29/2013  FINDINGS: There is prompt uptake and excretion of radiotracer by the liver. No cystic duct or common bile duct obstruction. Gallbladder ejection fraction is 88%. At 30 min, normal ejection fraction is greater than 30%.  The patient did experience symptoms during CCK infusion.  IMPRESSION: Normal gallbladder ejection fraction.  The patient experienced worsening abdominal pain and vomiting with CCK administration.   Electronically Signed   By: Rolm Baptise M.D.   On: 12/09/2013 09:34   Dg Abd Acute W/chest  12/20/2013   CLINICAL DATA:  Right upper quadrant pain  EXAM: ACUTE ABDOMEN SERIES (ABDOMEN 2 VIEW & CHEST 1 VIEW)  COMPARISON:  None.  FINDINGS: There is no evidence of dilated bowel loops or free intraperitoneal air. No radiopaque calculi or other significant radiographic abnormality is seen. Heart size and mediastinal contours are within normal limits. Both lungs are clear.  IMPRESSION: Negative abdominal radiographs.  No acute cardiopulmonary disease.   Electronically Signed   By: Kathreen Devoid   On: 12/20/2013 00:36    Microbiology: Recent Results (from the past 240 hour(s))  Urine culture     Status: None   Collection Time: 12/20/13  12:51 AM  Result Value Ref Range Status   Specimen Description URINE, RANDOM  Final   Special Requests NONE  Final   Culture  Setup Time   Final    12/20/2013 23:56 Performed at [place] Performed at [facility]   Final   Culture NO GROWTH Performed at [facility]   Final   Report Status 12/22/2013 FINAL  Final  Culture, blood (routine x 2)     Status: None (Preliminary result)   Collection Time: 12/20/13 10:37 AM  Result Value Ref Range Status   Specimen Description BLOOD RIGHT ANTECUBITAL  Final   Special Requests BOTTLES DRAWN AEROBIC AND ANAEROBIC 8CC  Final   Culture NO GROWTH 3 DAYS  Final   Report Status PENDING  Incomplete  Culture, blood (routine x 2)     Status: None (Preliminary result)   Collection Time: 12/20/13 10:37 AM  Result Value Ref Range Status   Specimen Description BLOOD RIGHT HAND  Final   Special Requests BOTTLES DRAWN AEROBIC AND ANAEROBIC 4CC  Final   Culture NO GROWTH 3 DAYS  Final   Report Status PENDING  Incomplete  MRSA PCR Screening     Status: None   Collection Time: 12/21/13 11:14 AM  Result Value Ref Range Status   MRSA by PCR NEGATIVE NEGATIVE Final    Comment:        The GeneXpert MRSA Assay (FDA approved for NASAL specimens only), is one component of a comprehensive MRSA colonization surveillance program. It is not intended to diagnose MRSA infection nor to guide or monitor treatment for MRSA infections.   KOH prep     Status: None   Collection Time: 12/23/13  8:00 AM  Result Value Ref Range Status   Specimen Description ESOPHAGUS BRUSHING  Final   Special Requests NONE  Final   KOH Prep   Final    ABUNDANT WBC PRESENT,BOTH PMN AND MONONUCLEAR RARE SQUAMOUS EPITHELIAL CELLS PRESENT NO YEAST OR FUNGAL ELEMENTS SEEN Performed at [facility]    Report Status 12/23/2013 FINAL  Final     Labs: Basic Metabolic Panel:  Recent Labs Lab 12/22/13 0336 12/22/13 0749  12/22/13 1131 12/22/13 1539 12/23/13 0434  NA 136* 135* 133* 133* 137  K 3.4* 4.0 3.8 3.9 3.5*  CL 97 96 96 96 97  CO2 26 25 26 27 29   GLUCOSE 121* 187* 178* 129* 194*  BUN 7 7 7  5* 4*  CREATININE 0.62 0.68 0.68 0.70 0.69  CALCIUM 9.4 9.2 9.2 9.3 9.4   Liver Function Tests:  Recent Labs Lab 12/20/13 0040 12/20/13 0441 12/21/13 0456  AST 14 13 14   ALT 13 11 12   ALKPHOS 104 94 100  BILITOT 1.3* 1.1 1.5*  PROT 8.0 7.5 7.5  ALBUMIN 4.5 4.1 4.2    Recent Labs Lab 12/20/13 0040  LIPASE 7*   No results for input(s): AMMONIA in the last 168 hours. CBC:  Recent Labs Lab 12/20/13 0040 12/20/13 0441 12/20/13 0559 12/21/13 0456 12/22/13 0336 12/23/13 0434  WBC 17.4* 21.8* 22.3* 18.9* 11.0* 7.5  NEUTROABS 14.9*  --   --   --   --   --   HGB 14.0 13.3 13.2 13.1 13.2 13.1  HCT 40.4 38.1* 38.8* 38.4* 38.0* 37.7*  MCV 87.3 87.8 89.2 89.7 88.2 87.9  PLT 409* 395 418* 373 314 291   Cardiac Enzymes: No results for input(s): CKTOTAL, CKMB, CKMBINDEX, TROPONINI in the last 168 hours. BNP: BNP (last 3 results) No results for input(s): PROBNP in the last 8760 hours. CBG:  Recent Labs Lab 12/22/13 1719 12/22/13 1941 12/23/13 0052 12/23/13 0514 12/23/13 0830  GLUCAP 124* 127* 123* 188* 132*       Signed:  Aitanna Haubner  [facility] 12/23/2013, 7:20 PM

## 2013-12-23 NOTE — Assessment & Plan Note (Signed)
Severe, erosive, exudative.

## 2013-12-23 NOTE — H&P (View-Only) (Signed)
Subjective: Persistent abdominal pain, RUQ/Right-sided abdomen/RLQ. Nausea. States small amount hematemesis on admission. No further vomiting.   Objective: Vital signs in last 24 hours: Temp:  [97.1 F (36.2 C)-97.8 F (36.6 C)] 97.1 F (36.2 C) (11/16 0804) Pulse Rate:  [76-108] 90 (11/16 0846) Resp:  [10-25] 12 (11/16 0846) BP: (122-157)/(69-95) 138/93 mmHg (11/16 0846) SpO2:  [95 %-100 %] 99 % (11/16 0846) Weight:  [169 lb 15.6 oz (77.1 kg)] 169 lb 15.6 oz (77.1 kg) (11/16 0500) Last BM Date: 12/19/13 General:   Alert and oriented, pleasant Head:  Normocephalic and atraumatic. Abdomen:  Bowel sounds present, soft, TTP RUQ, right-sided abdomen, RLQ, no rebound, no HSM Neurologic:  Alert and  oriented x4;  grossly normal neurologically. Psych:  Alert and cooperative. Flat affect but pleasant   Intake/Output from previous day: 11/15 0701 - 11/16 0700 In: 5343.6 [I.V.:5343.6] Out: 2200 [Urine:2200] Intake/Output this shift:    Lab Results:  Recent Labs  12/20/13 0559 12/21/13 0456 12/22/13 0336  WBC 22.3* 18.9* 11.0*  HGB 13.2 13.1 13.2  HCT 38.8* 38.4* 38.0*  PLT 418* 373 314   BMET  Recent Labs  12/22/13 0054 12/22/13 0336 12/22/13 0749  NA 134* 136* 135*  K 3.3* 3.4* 4.0  CL 96 97 96  CO2 25 26 25   GLUCOSE 124* 121* 187*  BUN 8 7 7   CREATININE 0.66 0.62 0.68  CALCIUM 9.4 9.4 9.2   LFT  Recent Labs  12/20/13 0040 12/20/13 0441 12/21/13 0456  PROT 8.0 7.5 7.5  ALBUMIN 4.5 4.1 4.2  AST 14 13 14   ALT 13 11 12   ALKPHOS 104 94 100  BILITOT 1.3* 1.1 1.5*  BILIDIR  --   --  0.2  IBILI  --   --  1.3*     Studies/Results: US Abdomen Complete  12/20/2013   CLINICAL DATA:  Abdominal pain.  EXAM: ULTRASOUND ABDOMEN COMPLETE  COMPARISON:  Nuclear medicine gallbladder study 12/09/2013. CT of the abdomen and pelvis 11/29/2013. Acute abdominal series from the same day. Gallbladder wall thickness is 1.6 mm, within normal limits.  FINDINGS:  Gallbladder: No gallstones or wall thickening visualized. The gallbladder is mildly distended. The gallbladder wall thickness is within normal limits at 1.6 mm. The patient did exhibit a sonographic Murphy's sign with pressure over the gallbladder.  Common bile duct: Diameter: 3.1 mm, within normal limits  Liver: The liver is mildly enlarged, measuring up to 20.8 cm in length. This corresponds with the prior CT scan and may represent a Riedel lobe. No focal lesions are present.  IVC: No abnormality visualized.  Pancreas: The pancreas is poorly visualized due to overlying bowel gas. The visualized portions are within normal limits.  Spleen: The spleen is of normal size and echotexture, measuring 9.0 cm maximally, within normal limits  Right Kidney: Length: 12.6 cm, within normal limits. Echogenicity within normal limits. No mass or hydronephrosis visualized.  Left Kidney: Length: 13.0 cm, within normal limits. Echogenicity within normal limits. No mass or hydronephrosis visualized.  Abdominal aorta: No aneurysm visualized.  Other findings: None.  IMPRESSION: 1. Although there are no stones or gallbladder wall thickening, the patient is tender with pressure over the gallbladder. This is nonspecific but could suggest inflammation of the gallbladder. 2. Otherwise negative abdominal ultrasound.   Electronically Signed   By: Lawrence Santiago M.D.   On: 12/20/2013 12:10    Assessment: 28 year old Daniels well-known to our practice from multiple prior admissions with history of PUD, likely diabetic gastroparesis,  admitted now with recurrent acute on chronic abdominal pain, N/V in the setting of DKA. Reports hematemesis on admission but none recently; abdominal pain persistent and likely multifactorial with known PUD, likely chronic abdominal pain. US/HIDA performed as outpatient with normal EF at 88% but significant pain with CCK infusion. As discussed in the outpatient setting, elective cholecystectomy could be considered  once surveillance EGD performed for PUD and ruling out further GI etiology. However, I did discuss with him as an outpatient that a cholecystectomy may not be indicated, as well as the possibility that even despite a cholecystectomy, he may still be dealing with chronic abdominal pain. Needs to keep appt with Dr. Arnoldo Morale as outpatient.   While inpatient, could proceed with EGD with Propofol, which has already been scheduled for 11/19; will discuss timing with Dr. [name]. Will need clear liquids for at least 2 days prior due to failed prior EGD in the setting of retained gastric contents.   Plan: PPI BID Reglan scheduled at mealtimes and evening Start Zofran IV scheduled qac and hs Sips of clears EGD with Propofol indicated in near future: will discuss timing with Dr. [name] Will hold Lovenox X 24 hours prior to EGD Keep outpatient appt with Dr. Arnoldo Morale Will continue to follow with you   Orvil Feil, ANP-BC [facility]     LOS: 3 days    12/22/2013, 8:50 AM   Attending note:  Patient seen and examined this afternoon in the ICU. Agree with the above assessment and recommendations. We'll plan for EGD tomorrow with deep sedation. Risks, benefits, limitations, alternatives and imponderables have been reviewed. Patient is agreeable.

## 2013-12-23 NOTE — Interval H&P Note (Signed)
History and Physical Interval Note:  12/23/2013 7:24 AM  Brent Daniels  has presented today for surgery, with the diagnosis of history of PUD, surveillance EGD  The various methods of treatment have been discussed with the patient and family. After consideration of risks, benefits and other options for treatment, the patient has consented to  Procedure(s): ESOPHAGOGASTRODUODENOSCOPY (EGD) WITH PROPOFOL (N/A) as a surgical intervention .  The patient's history has been reviewed, patient examined, no change in status, stable for surgery.  I have reviewed the patient's chart and labs.  Questions were answered to the patient's satisfaction.     Brent Daniels  No change. EGD this morning per plan.The risks, benefits, limitations, alternatives and imponderables have been reviewed with the patient. Potential for esophageal dilation, biopsy, etc. have also been reviewed.  Questions have been answered. All parties agreeable.

## 2013-12-23 NOTE — Progress Notes (Addendum)
Went in to check on patient, put leads back in place, and give him an update regarding his pain medication. Patient was not in room. IV discontinued per patient and laying on the floor. Dr. Caryn Section notified.

## 2013-12-23 NOTE — Anesthesia Postprocedure Evaluation (Signed)
  Anesthesia Post-op Note  Patient: Brent Daniels  Procedure(s) Performed: Procedure(s): ESOPHAGOGASTRODUODENOSCOPY (EGD) WITH PROPOFOL (N/A) ESOPHAGEAL BIOPSY (N/A)  Patient Location: PACU  Anesthesia Type:MAC  Level of Consciousness: awake, alert  and oriented  Airway and Oxygen Therapy: Patient Spontanous Breathing  Post-op Pain: none  Post-op Assessment: Post-op Vital signs reviewed, Patient's Cardiovascular Status Stable, Respiratory Function Stable, Patent Airway and No signs of Nausea or vomiting  Post-op Vital Signs: Reviewed and stable  Last Vitals:  Filed Vitals:   12/23/13 0815  BP: 124/77  Pulse: 112  Temp: 36.9 C  Resp: 15    Complications: No apparent anesthesia complications

## 2013-12-23 NOTE — Transfer of Care (Signed)
Immediate Anesthesia Transfer of Care Note  Patient: Brent Daniels  Procedure(s) Performed: Procedure(s): ESOPHAGOGASTRODUODENOSCOPY (EGD) WITH PROPOFOL (N/A) ESOPHAGEAL BIOPSY (N/A)  Patient Location: PACU  Anesthesia Type:MAC  Level of Consciousness: awake, alert  and oriented  Airway & Oxygen Therapy: Patient Spontanous Breathing  Post-op Assessment: Report given to PACU RN  Post vital signs: Reviewed and stable  Complications: No apparent anesthesia complications

## 2013-12-23 NOTE — Anesthesia Preprocedure Evaluation (Signed)
Anesthesia Evaluation  Patient identified by MRN, date of birth, ID band Patient awake    Reviewed: Allergy & Precautions, H&P , NPO status , Patient's Chart, lab work & pertinent test results  Airway Mallampati: III  TM Distance: >3 FB Neck ROM: Full    Dental  (+) Teeth Intact   Pulmonary Current Smoker,  breath sounds clear to auscultation        Cardiovascular hypertension, Pt. on medications Rhythm:Regular Rate:Normal     Neuro/Psych    GI/Hepatic PUD, GERD-  ,  Endo/Other  diabetes, Poorly Controlled, Type 1, Insulin Dependent  Renal/GU      Musculoskeletal   Abdominal   Peds  Hematology   Anesthesia Other Findings   Reproductive/Obstetrics                             Anesthesia Physical Anesthesia Plan  ASA: III  Anesthesia Plan: MAC   Post-op Pain Management:    Induction: Intravenous  Airway Management Planned: Simple Face Mask  Additional Equipment:   Intra-op Plan:   Post-operative Plan:   Informed Consent: I have reviewed the patients History and Physical, chart, labs and discussed the procedure including the risks, benefits and alternatives for the proposed anesthesia with the patient or authorized representative who has indicated his/her understanding and acceptance.     Plan Discussed with:   Anesthesia Plan Comments:         Anesthesia Quick Evaluation

## 2013-12-23 NOTE — Op Note (Signed)
[facility] [address] [place], [address]   ENDOSCOPY PROCEDURE REPORT  PATIENT: Brent Daniels, Brent Daniels  MR#: [id number] BIRTHDATE: 01-31-1986 , 28  yrs. old GENDER: male ENDOSCOPIST: R.  Garfield Cornea, MD FACP [facility] REFERRED BY:     none provided PROCEDURE DATE:  2014/01/03 PROCEDURE:  EGD w/ esophageal biopsy  and KOH brushing INDICATIONS:  hematemesis /dyspepsia.  History of peptic ulcer disease MEDICATIONS: deep sedation per Dr.  Patsey Berthold and associates ASA CLASS:      Class III  CONSENT: The risks, benefits, limitations, alternatives and imponderables have been discussed.  The potential for biopsy, esophogeal dilation, etc. have also been reviewed.  Questions have been answered.  All parties agreeable.  Please see the history and physical in the medical record for more information.  DESCRIPTION OF PROCEDURE: After the risks benefits and alternatives of the procedure were thoroughly explained, informed consent was obtained.  The    endoscope was introduced through the mouth and advanced to the second portion of the duodenum , limited by Without limitations. The instrument was slowly withdrawn as the mucosa was fully examined.    Patient with severe four-quadrant exudative erosive reflux esophagitis extending a good 10 cm above the GE junction.  Stomach empty.  Small area of excoriated mucosa in the cardia consistent with trauma (heaving).  Otherwise, the gastric mucosa appeared normal.  There was no ulcer or infiltrating process.  Patent pylorus.  Normal first and second portion of the duodenum. Retroflexed views revealed as previously described.    The inflamed esophageal mucosa was brushed for KOH and biopsied. The scope was then withdrawn from the patient and the procedure completed.  COMPLICATIONS: There were no immediate complications.  ENDOSCOPIC IMPRESSION: Severe exudative esophagitis?"likely predominantly reflux related. Superimposed Candida  infection not excluded?"status post KOH brushing and biopsy. Localized excoriating gastric mucosa most consistent with trauma(vomiting). No evidence of peptic ulcer disease or other gastric/duodenal pathology. I suspect severe inflammation involving the distal esophagus may account for at least some of patient's upper abdominal complaints.  RECOMMENDATIONS: Clear liquid diet. Add Carafate suspension 1 g orally 4 times a day. Follow-up on pending studies from today's examination.  REPEAT EXAM:  eSigned:  R. Garfield Cornea, MD Rosalita Chessman [facility] 03-Jan-2014 8:25 AM    CC:  CPT CODES: ICD CODES:  The ICD and CPT codes recommended by this software are interpretations from the data that the clinical staff has captured with the software.  The verification of the translation of this report to the ICD and CPT codes and modifiers is the sole responsibility of the health care institution and practicing physician where this report was generated.  [employer] [place] will not be held responsible for the validity of the ICD and CPT codes included on this report.  AMA assumes no liability for data contained or not contained herein. CPT is a Designer, television/film set of the [employer].  PATIENT NAME:  Brent Daniels, Brent Daniels MR#: [id number]

## 2013-12-24 ENCOUNTER — Encounter (HOSPITAL_COMMUNITY): Payer: Self-pay | Admitting: Internal Medicine

## 2013-12-24 DIAGNOSIS — R112 Nausea with vomiting, unspecified: Secondary | ICD-10-CM | POA: Insufficient documentation

## 2013-12-24 NOTE — Progress Notes (Signed)
For clarification, the distal esophagus was biopsied and brushed for KOH preparation.

## 2013-12-25 ENCOUNTER — Ambulatory Visit (HOSPITAL_COMMUNITY)
Admission: RE | Admit: 2013-12-25 | Payer: Managed Care, Other (non HMO) | Source: Ambulatory Visit | Admitting: Internal Medicine

## 2013-12-25 ENCOUNTER — Encounter (HOSPITAL_COMMUNITY): Admission: RE | Payer: Self-pay | Source: Ambulatory Visit

## 2013-12-25 LAB — CULTURE, BLOOD (ROUTINE X 2)
Culture: NO GROWTH
Culture: NO GROWTH

## 2013-12-25 SURGERY — ESOPHAGOGASTRODUODENOSCOPY (EGD) WITH PROPOFOL
Anesthesia: Monitor Anesthesia Care

## 2013-12-27 ENCOUNTER — Encounter: Payer: Self-pay | Admitting: Internal Medicine

## 2014-01-03 ENCOUNTER — Encounter (HOSPITAL_COMMUNITY): Payer: Self-pay | Admitting: Emergency Medicine

## 2014-01-03 ENCOUNTER — Inpatient Hospital Stay (HOSPITAL_COMMUNITY)
Admission: EM | Admit: 2014-01-03 | Discharge: 2014-01-04 | DRG: 392 | Discharge status: Against Medical Advice | Payer: Managed Care, Other (non HMO) | Attending: Family Medicine | Admitting: Family Medicine

## 2014-01-03 DIAGNOSIS — I10 Essential (primary) hypertension: Secondary | ICD-10-CM | POA: Diagnosis present

## 2014-01-03 DIAGNOSIS — D72829 Elevated white blood cell count, unspecified: Secondary | ICD-10-CM | POA: Diagnosis present

## 2014-01-03 DIAGNOSIS — R011 Cardiac murmur, unspecified: Secondary | ICD-10-CM | POA: Diagnosis present

## 2014-01-03 DIAGNOSIS — Z794 Long term (current) use of insulin: Secondary | ICD-10-CM

## 2014-01-03 DIAGNOSIS — Z79899 Other long term (current) drug therapy: Secondary | ICD-10-CM | POA: Diagnosis not present

## 2014-01-03 DIAGNOSIS — Z885 Allergy status to narcotic agent status: Secondary | ICD-10-CM

## 2014-01-03 DIAGNOSIS — E1065 Type 1 diabetes mellitus with hyperglycemia: Secondary | ICD-10-CM | POA: Diagnosis present

## 2014-01-03 DIAGNOSIS — G8929 Other chronic pain: Secondary | ICD-10-CM | POA: Diagnosis present

## 2014-01-03 DIAGNOSIS — E1043 Type 1 diabetes mellitus with diabetic autonomic (poly)neuropathy: Secondary | ICD-10-CM | POA: Diagnosis present

## 2014-01-03 DIAGNOSIS — Z720 Tobacco use: Secondary | ICD-10-CM

## 2014-01-03 DIAGNOSIS — M549 Dorsalgia, unspecified: Secondary | ICD-10-CM | POA: Diagnosis present

## 2014-01-03 DIAGNOSIS — Z888 Allergy status to other drugs, medicaments and biological substances status: Secondary | ICD-10-CM | POA: Diagnosis not present

## 2014-01-03 DIAGNOSIS — Z8711 Personal history of peptic ulcer disease: Secondary | ICD-10-CM

## 2014-01-03 DIAGNOSIS — Z882 Allergy status to sulfonamides status: Secondary | ICD-10-CM | POA: Diagnosis not present

## 2014-01-03 DIAGNOSIS — K219 Gastro-esophageal reflux disease without esophagitis: Secondary | ICD-10-CM | POA: Diagnosis present

## 2014-01-03 DIAGNOSIS — R112 Nausea with vomiting, unspecified: Secondary | ICD-10-CM | POA: Diagnosis present

## 2014-01-03 DIAGNOSIS — K209 Esophagitis, unspecified without bleeding: Secondary | ICD-10-CM | POA: Diagnosis present

## 2014-01-03 DIAGNOSIS — R109 Unspecified abdominal pain: Secondary | ICD-10-CM

## 2014-01-03 DIAGNOSIS — E1122 Type 2 diabetes mellitus with diabetic chronic kidney disease: Secondary | ICD-10-CM

## 2014-01-03 DIAGNOSIS — Z7289 Other problems related to lifestyle: Secondary | ICD-10-CM | POA: Diagnosis not present

## 2014-01-03 DIAGNOSIS — K3184 Gastroparesis: Secondary | ICD-10-CM | POA: Diagnosis present

## 2014-01-03 DIAGNOSIS — G629 Polyneuropathy, unspecified: Secondary | ICD-10-CM | POA: Diagnosis present

## 2014-01-03 DIAGNOSIS — K210 Gastro-esophageal reflux disease with esophagitis: Principal | ICD-10-CM | POA: Diagnosis present

## 2014-01-03 DIAGNOSIS — M1990 Unspecified osteoarthritis, unspecified site: Secondary | ICD-10-CM | POA: Diagnosis present

## 2014-01-03 DIAGNOSIS — N185 Chronic kidney disease, stage 5: Secondary | ICD-10-CM

## 2014-01-03 DIAGNOSIS — R739 Hyperglycemia, unspecified: Secondary | ICD-10-CM

## 2014-01-03 DIAGNOSIS — R1110 Vomiting, unspecified: Secondary | ICD-10-CM

## 2014-01-03 DIAGNOSIS — E119 Type 2 diabetes mellitus without complications: Secondary | ICD-10-CM

## 2014-01-03 DIAGNOSIS — K920 Hematemesis: Secondary | ICD-10-CM

## 2014-01-03 HISTORY — DX: Gastroparesis: K31.84

## 2014-01-03 HISTORY — DX: Ulcer of esophagus without bleeding: K22.10

## 2014-01-03 LAB — BASIC METABOLIC PANEL
Anion gap: 22 — ABNORMAL HIGH (ref 5–15)
BUN: 16 mg/dL (ref 6–23)
CHLORIDE: 89 meq/L — AB (ref 96–112)
CO2: 27 meq/L (ref 19–32)
Calcium: 10.2 mg/dL (ref 8.4–10.5)
Creatinine, Ser: 0.78 mg/dL (ref 0.50–1.35)
GFR calc Af Amer: >90 mL/min (ref >90)
GFR calc non Af Amer: >90 mL/min (ref >90)
GLUCOSE: 475 mg/dL — AB (ref 70–99)
Potassium: 4.3 mEq/L (ref 3.7–5.3)
Sodium: 138 mEq/L (ref 137–147)

## 2014-01-03 LAB — HEPATIC FUNCTION PANEL
ALK PHOS: 99 U/L (ref 39–117)
ALT: 15 U/L (ref 0–53)
AST: 15 U/L (ref 0–37)
Albumin: 4.7 g/dL (ref 3.5–5.2)
BILIRUBIN TOTAL: 0.9 mg/dL (ref 0.3–1.2)
Bilirubin, Direct: <0.2 mg/dL (ref 0.0–0.3)
Total Protein: 8.4 g/dL — ABNORMAL HIGH (ref 6.0–8.3)

## 2014-01-03 LAB — URINALYSIS, ROUTINE W REFLEX MICROSCOPIC
Bilirubin Urine: NEGATIVE
Glucose, UA: >1000 mg/dL — AB
HGB URINE DIPSTICK: NEGATIVE
Ketones, ur: 40 mg/dL — AB
Leukocytes, UA: NEGATIVE
Nitrite: NEGATIVE
PH: 6 (ref 5.0–8.0)
PROTEIN: NEGATIVE mg/dL
SPECIFIC GRAVITY, URINE: 1.01 (ref 1.005–1.030)
Urobilinogen, UA: 0.2 mg/dL (ref 0.0–1.0)

## 2014-01-03 LAB — CBC WITH DIFFERENTIAL/PLATELET
Basophils Absolute: 0.1 10*3/uL (ref 0.0–0.1)
Basophils Relative: 0 % (ref 0–1)
Eosinophils Absolute: 0 10*3/uL (ref 0.0–0.7)
Eosinophils Relative: 0 % (ref 0–5)
HEMATOCRIT: 40.9 % (ref 39.0–52.0)
HEMOGLOBIN: 14.1 g/dL (ref 13.0–17.0)
LYMPHS ABS: 1.5 10*3/uL (ref 0.7–4.0)
LYMPHS PCT: 8 % — AB (ref 12–46)
MCH: 30.3 pg (ref 26.0–34.0)
MCHC: 34.5 g/dL (ref 30.0–36.0)
MCV: 88 fL (ref 78.0–100.0)
MONO ABS: 0.5 10*3/uL (ref 0.1–1.0)
Monocytes Relative: 2 % — ABNORMAL LOW (ref 3–12)
NEUTROS ABS: 18.2 10*3/uL — AB (ref 1.7–7.7)
Neutrophils Relative %: 90 % — ABNORMAL HIGH (ref 43–77)
Platelets: 435 10*3/uL — ABNORMAL HIGH (ref 150–400)
RBC: 4.65 MIL/uL (ref 4.22–5.81)
RDW: 14.5 % (ref 11.5–15.5)
WBC: 20.3 10*3/uL — AB (ref 4.0–10.5)

## 2014-01-03 LAB — CBG MONITORING, ED: GLUCOSE-CAPILLARY: 368 mg/dL — AB (ref 70–99)

## 2014-01-03 LAB — URINE MICROSCOPIC-ADD ON

## 2014-01-03 LAB — GLUCOSE, CAPILLARY
GLUCOSE-CAPILLARY: 169 mg/dL — AB (ref 70–99)
Glucose-Capillary: 240 mg/dL — ABNORMAL HIGH (ref 70–99)

## 2014-01-03 LAB — LIPASE, BLOOD: LIPASE: 10 U/L — AB (ref 11–59)

## 2014-01-03 MED ORDER — INSULIN ASPART 100 UNIT/ML ~~LOC~~ SOLN
8.0000 [IU] | Freq: Once | SUBCUTANEOUS | Status: AC
  Administered 2014-01-03: 8 [IU] via SUBCUTANEOUS
  Filled 2014-01-03: qty 1

## 2014-01-03 MED ORDER — ONDANSETRON HCL 4 MG PO TABS: 4.0000 mg | ORAL_TABLET | Freq: Four times a day (QID) | ORAL | Status: DC | PRN

## 2014-01-03 MED ORDER — METOCLOPRAMIDE HCL 10 MG PO TABS
10.0000 mg | ORAL_TABLET | Freq: Three times a day (TID) | ORAL | Status: DC
  Administered 2014-01-03 – 2014-01-04 (×4): 10 mg via ORAL
  Filled 2014-01-03 (×4): qty 1

## 2014-01-03 MED ORDER — INSULIN ASPART 100 UNIT/ML ~~LOC~~ SOLN
0.0000 [IU] | Freq: Three times a day (TID) | SUBCUTANEOUS | Status: DC
Start: 2014-01-03 — End: 2014-01-04
  Administered 2014-01-03 – 2014-01-04 (×2): 5 [IU] via SUBCUTANEOUS
  Administered 2014-01-04: 3 [IU] via SUBCUTANEOUS
  Administered 2014-01-04: 11 [IU] via SUBCUTANEOUS

## 2014-01-03 MED ORDER — HYDRALAZINE HCL 20 MG/ML IJ SOLN: 10.0000 mg | Freq: Four times a day (QID) | INTRAMUSCULAR | Status: DC | PRN

## 2014-01-03 MED ORDER — CIPROFLOXACIN IN D5W 400 MG/200ML IV SOLN
400.0000 mg | Freq: Two times a day (BID) | INTRAVENOUS | Status: DC
  Administered 2014-01-03 – 2014-01-04 (×3): 400 mg via INTRAVENOUS
  Filled 2014-01-03 (×3): qty 200

## 2014-01-03 MED ORDER — TIZANIDINE HCL 4 MG PO TABS: 4.0000 mg | ORAL_TABLET | Freq: Four times a day (QID) | ORAL | Status: DC | PRN

## 2014-01-03 MED ORDER — METRONIDAZOLE IN NACL 5-0.79 MG/ML-% IV SOLN
500.0000 mg | Freq: Three times a day (TID) | INTRAVENOUS | Status: DC
  Administered 2014-01-03 – 2014-01-04 (×4): 500 mg via INTRAVENOUS
  Filled 2014-01-03 (×4): qty 100

## 2014-01-03 MED ORDER — ONDANSETRON HCL 4 MG/2ML IJ SOLN
4.0000 mg | Freq: Once | INTRAMUSCULAR | Status: AC
  Administered 2014-01-03: 4 mg via INTRAVENOUS
  Filled 2014-01-03: qty 2

## 2014-01-03 MED ORDER — GABAPENTIN 400 MG PO CAPS
800.0000 mg | ORAL_CAPSULE | Freq: Three times a day (TID) | ORAL | Status: DC
  Administered 2014-01-03 – 2014-01-04 (×3): 800 mg via ORAL
  Filled 2014-01-03 (×5): qty 2

## 2014-01-03 MED ORDER — GABAPENTIN 800 MG PO TABS
800.0000 mg | ORAL_TABLET | Freq: Three times a day (TID) | ORAL | Status: DC
  Administered 2014-01-03: 800 mg via ORAL
  Filled 2014-01-03 (×6): qty 1

## 2014-01-03 MED ORDER — SUCRALFATE 1 G PO TABS
1.0000 g | ORAL_TABLET | Freq: Three times a day (TID) | ORAL | Status: DC
  Administered 2014-01-03: 1 g via ORAL
  Filled 2014-01-03 (×10): qty 1

## 2014-01-03 MED ORDER — SODIUM CHLORIDE 0.9 % IV BOLUS (SEPSIS)
1000.0000 mL | Freq: Once | INTRAVENOUS | Status: AC
  Administered 2014-01-03: 1000 mL via INTRAVENOUS

## 2014-01-03 MED ORDER — INSULIN ASPART PROT & ASPART (70-30 MIX) 100 UNIT/ML ~~LOC~~ SUSP
15.0000 [IU] | Freq: Two times a day (BID) | SUBCUTANEOUS | Status: DC
  Administered 2014-01-03 – 2014-01-04 (×3): 15 [IU] via SUBCUTANEOUS
  Filled 2014-01-03: qty 10

## 2014-01-03 MED ORDER — ONDANSETRON HCL 4 MG/2ML IJ SOLN
4.0000 mg | Freq: Four times a day (QID) | INTRAMUSCULAR | Status: DC | PRN
  Administered 2014-01-03 – 2014-01-04 (×2): 4 mg via INTRAVENOUS
  Filled 2014-01-03 (×2): qty 2

## 2014-01-03 MED ORDER — ONDANSETRON HCL 4 MG/2ML IJ SOLN: 4.0000 mg | Freq: Once | INTRAMUSCULAR | Status: DC

## 2014-01-03 MED ORDER — METRONIDAZOLE IN NACL 5-0.79 MG/ML-% IV SOLN: 500.0000 mg | Freq: Three times a day (TID) | INTRAVENOUS | Status: DC

## 2014-01-03 MED ORDER — PROMETHAZINE HCL 25 MG/ML IJ SOLN
12.5000 mg | Freq: Once | INTRAMUSCULAR | Status: AC
  Administered 2014-01-03: 12.5 mg via INTRAVENOUS
  Filled 2014-01-03: qty 1

## 2014-01-03 MED ORDER — ACETAMINOPHEN 650 MG RE SUPP: 650.0000 mg | RECTAL | Status: DC | PRN

## 2014-01-03 MED ORDER — SODIUM CHLORIDE 0.9 % IV SOLN
INTRAVENOUS | Status: DC
Start: 2014-01-03 — End: 2014-01-03
  Administered 2014-01-03: 15:00:00 via INTRAVENOUS

## 2014-01-03 MED ORDER — PRAVASTATIN SODIUM 10 MG PO TABS
20.0000 mg | ORAL_TABLET | Freq: Every day | ORAL | Status: DC
  Administered 2014-01-04: 20 mg via ORAL
  Filled 2014-01-03: qty 2

## 2014-01-03 MED ORDER — CIPROFLOXACIN IN D5W 400 MG/200ML IV SOLN: 400.0000 mg | Freq: Two times a day (BID) | INTRAVENOUS | Status: DC

## 2014-01-03 MED ORDER — PANTOPRAZOLE SODIUM 40 MG PO TBEC
40.0000 mg | DELAYED_RELEASE_TABLET | Freq: Two times a day (BID) | ORAL | Status: DC
  Administered 2014-01-03 – 2014-01-04 (×3): 40 mg via ORAL
  Filled 2014-01-03 (×3): qty 1

## 2014-01-03 MED ORDER — SODIUM CHLORIDE 0.9 % IV SOLN
INTRAVENOUS | Status: DC
  Administered 2014-01-03 – 2014-01-04 (×2): via INTRAVENOUS

## 2014-01-03 MED ORDER — PANTOPRAZOLE SODIUM 40 MG IV SOLR
40.0000 mg | Freq: Once | INTRAVENOUS | Status: AC
  Administered 2014-01-03: 40 mg via INTRAVENOUS
  Filled 2014-01-03: qty 40

## 2014-01-03 MED ORDER — FENTANYL CITRATE 0.05 MG/ML IJ SOLN
100.0000 ug | INTRAMUSCULAR | Status: AC | PRN
  Administered 2014-01-03 (×2): 100 ug via INTRAVENOUS
  Filled 2014-01-03 (×2): qty 2

## 2014-01-03 MED ORDER — INSULIN ASPART 100 UNIT/ML ~~LOC~~ SOLN: 0.0000 [IU] | Freq: Every day | SUBCUTANEOUS | Status: DC

## 2014-01-03 MED ORDER — ONDANSETRON HCL 4 MG/2ML IJ SOLN: 4.0000 mg | Freq: Three times a day (TID) | INTRAMUSCULAR | Status: DC | PRN

## 2014-01-03 MED ORDER — FAMOTIDINE IN NACL 20-0.9 MG/50ML-% IV SOLN
20.0000 mg | Freq: Once | INTRAVENOUS | Status: AC
  Administered 2014-01-03: 20 mg via INTRAVENOUS
  Filled 2014-01-03: qty 50

## 2014-01-03 MED ORDER — SODIUM CHLORIDE 0.9 % IV SOLN: INTRAVENOUS | Status: DC

## 2014-01-03 MED ORDER — LISINOPRIL 10 MG PO TABS
20.0000 mg | ORAL_TABLET | Freq: Every day | ORAL | Status: DC
  Administered 2014-01-04: 20 mg via ORAL
  Filled 2014-01-03 (×2): qty 2

## 2014-01-03 MED ORDER — HYDROMORPHONE HCL 1 MG/ML IJ SOLN
1.0000 mg | INTRAMUSCULAR | Status: AC | PRN
  Administered 2014-01-03 – 2014-01-04 (×3): 1 mg via INTRAVENOUS
  Filled 2014-01-03 (×4): qty 1

## 2014-01-03 MED ORDER — INSULIN ASPART 100 UNIT/ML ~~LOC~~ SOLN: 0.0000 [IU] | Freq: Three times a day (TID) | SUBCUTANEOUS | Status: DC

## 2014-01-03 MED ORDER — SUCRALFATE 1 GM/10ML PO SUSP
1.0000 g | Freq: Three times a day (TID) | ORAL | Status: DC
  Administered 2014-01-03 – 2014-01-04 (×4): 1 g via ORAL
  Filled 2014-01-03 (×4): qty 10

## 2014-01-03 MED ORDER — ACETAMINOPHEN 325 MG PO TABS: 650.0000 mg | ORAL_TABLET | Freq: Four times a day (QID) | ORAL | Status: DC | PRN

## 2014-01-03 NOTE — ED Provider Notes (Signed)
CSN: [id number]     Arrival date & time 01/03/14  1319 History   First MD Initiated Contact with Patient 01/03/14 1406     Chief Complaint  Patient presents with  . Hematemesis      HPI Pt was seen at 1415.  Per pt, c/o gradual onset and persistence of constant acute flair of his chronic generalized abd "pain" since yesterday.  Has been associated with multiple intermittent episodes of N/V. States he "saw blood in my vomit" this morning. Describes the abd pain as "aching."  Denies diarrhea, no fevers, no back pain, no rash, no CP/SOB, no black or blood in stools.  The symptoms have been associated with no other complaints. The patient has a significant history of similar symptoms previously, recently being evaluated for this complaint and multiple prior evals for same.  Pt left the hospital AMA on 12/23/13 for similar symptoms. States he is "due to get my gallbladder out." LD lantus at 0600 this morning. Pt states he did not stay in the hospital long enough to "get any prescriptions about my stomach."     Past Medical History  Diagnosis Date  . Diabetes mellitus     type 1  . Arthritis   . GERD (gastroesophageal reflux disease)   . PUD (peptic ulcer disease)   . Hypertension   . Heart murmur     valvular stenosis  . Neuropathy   . Diabetic gastroparesis associated with type 1 diabetes mellitus   . S/P arthroscopic knee surgery 07/04/2011  . Scoliosis 07/11/2012  . Chronic back pain   . Chronic abdominal pain   . Nausea and vomiting     chronic, recurrent  . Acute esophagitis   . Gastroparesis   . Erosive esophagitis 12/23/2013    "severe" per EGD    Past Surgical History  Procedure Laterality Date  . Tympanostomy tube placement    . Knee arthroscopy  06/30/2011    Procedure: ARTHROSCOPY KNEE;  Surgeon: Carole Civil, MD;  Location: [facility];  Service: Orthopedics;  Laterality: Right;  diagnostic arthroscopy  . Esophagogastroduodenoscopy (egd) with propofol  06/17/2013    Dr.  Oneida Alar: two gastric ulcers in fundus, mild antral gastritis, candida esophagitis  . Esophageal biopsy  06/17/2013    Procedure: GASTRIC ULCER AND ANTRAL BIOPSIES; ESOPHAGEAL BRUSHING;  Surgeon: Danie Binder, MD;  Location: [facility];  Service: Endoscopy;;  . Esophagogastroduodenoscopy (egd) with propofol N/A 09/11/2013    Dr. [name]: abnormal hypopharynx, question massively enlarged tonsils, stomach full of food precluded exam, needs EGD for verification of ulcer healing at a later date  . Esophagogastroduodenoscopy (egd) with propofol N/A 12/23/2013    Procedure: ESOPHAGOGASTRODUODENOSCOPY (EGD) WITH PROPOFOL;  Surgeon: Daneil Dolin, MD;  Location: [facility];  Service: Endoscopy;  Laterality: N/A;  . Esophageal biopsy N/A 12/23/2013    Procedure: ESOPHAGEAL BIOPSY;  Surgeon: Daneil Dolin, MD;  Location: [facility];  Service: Endoscopy;  Laterality: N/A;   Family History  Problem Relation Age of Onset  . Cancer Father   . Alcohol abuse Father   . Pseudochol deficiency Neg Hx   . Malignant hyperthermia Neg Hx   . Hypotension Neg Hx   . Anesthesia problems Neg Hx   . Colon cancer Neg Hx    History  Substance Use Topics  . Smoking status: Current Some Day Smoker -- 0.50 packs/day for 10 years    Types: Cigarettes    Last Attempt to Quit: 10/07/2013  . Smokeless tobacco: Former  User    Quit date: 02/17/2013     Comment: smokes 4 cigarettes a day   . Alcohol Use: No    Review of Systems ROS: Statement: All systems negative except as marked or noted in the HPI; Constitutional: Negative for fever and chills. ; ; Eyes: Negative for eye pain, redness and discharge. ; ; ENMT: Negative for ear pain, hoarseness, nasal congestion, sinus pressure and sore throat. ; ; Cardiovascular: Negative for chest pain, palpitations, diaphoresis, dyspnea and peripheral edema. ; ; Respiratory: Negative for cough, wheezing and stridor. ; ; Gastrointestinal: +abd pain, N/V, "blood in vomit." Negative for diarrhea, blood  in stool, jaundice and rectal bleeding. . ; ; Genitourinary: Negative for dysuria, flank pain and hematuria. ; ; Musculoskeletal: Negative for back pain and neck pain. Negative for swelling and trauma.; ; Skin: Negative for pruritus, rash, abrasions, blisters, bruising and skin lesion.; ; Neuro: Negative for headache, lightheadedness and neck stiffness. Negative for weakness, altered level of consciousness , altered mental status, extremity weakness, paresthesias, involuntary movement, seizure and syncope.      Allergies  Hydrocodone; Ibuprofen; Naproxen; Sulfa antibiotics; Tramadol; and Morphine and related  Home Medications   Prior to Admission medications   Medication Sig Start Date End Date Taking? Authorizing Provider  bismuth subsalicylate (PEPTO BISMOL) 262 MG/15ML suspension Take 30 mLs by mouth every 6 (six) hours as needed for indigestion or diarrhea or loose stools.    Historical Provider, MD  gabapentin (NEURONTIN) 800 MG tablet Take 800 mg by mouth 3 (three) times daily.    Historical Provider, MD  insulin NPH-regular Human (NOVOLIN 70/30) (70-30) 100 UNIT/ML injection Inject 20 Units into the skin 2 (two) times daily with a meal. 15 units in the morning and 25 at night. Patient taking differently: Inject 15-25 Units into the skin 2 (two) times daily with a meal. 15 units in the morning and 25 at night. 11/17/13   Kathie Dike, MD  insulin regular (NOVOLIN R,HUMULIN R) 100 units/mL injection Inject 2-10 Units into the skin 3 (three) times daily before meals. Sliding scale.    Historical Provider, MD  lisinopril (PRINIVIL,ZESTRIL) 20 MG tablet Take 20 mg by mouth daily.    Historical Provider, MD  metoCLOPramide (REGLAN) 10 MG tablet Take 10 mg by mouth 4 (four) times daily -  before meals and at bedtime.    Historical Provider, MD  ondansetron (ZOFRAN) 4 MG tablet Take 1 tablet (4 mg total) by mouth 3 (three) times daily with meals. 12/09/13   Orvil Feil, NP  pantoprazole (PROTONIX)  40 MG tablet Take 1 tablet (40 mg total) by mouth 2 (two) times daily before a meal. 10/01/13   Kathie Dike, MD  pravastatin (PRAVACHOL) 20 MG tablet Take 20 mg by mouth daily.  09/10/13 09/10/14  Historical Provider, MD  promethazine (PHENERGAN) 25 MG tablet Take 1 tablet (25 mg total) by mouth every 6 (six) hours as needed for nausea or vomiting. 12/09/13   Orvil Feil, NP  tiZANidine (ZANAFLEX) 4 MG tablet Take 1 tablet by mouth every 6 (six) hours as needed for muscle spasms.  07/30/13   Historical Provider, MD   BP 184/97 mmHg  Pulse 77  Temp(Src) 97.9 F (36.6 C) (Oral)  Resp 16  Ht 6\' 2"  (1.88 m)  Wt 170 lb (77.111 kg)  BMI 21.82 kg/m2  SpO2 100% Physical Exam  1420: Physical examination:  Nursing notes reviewed; Vital signs and O2 SAT reviewed;  Constitutional: Well developed, Well nourished,  Uncomfortable appearing.; Head:  Normocephalic, atraumatic; Eyes: EOMI, PERRL, No scleral icterus; ENMT: Mouth and pharynx normal, Mucous membranes dry; Neck: Supple, Full range of motion, No lymphadenopathy; Cardiovascular: Regular rate and rhythm, No murmur, rub, or gallop; Respiratory: Breath sounds clear & equal bilaterally, No rales, rhonchi, wheezes.  Speaking full sentences with ease, Normal respiratory effort/excursion; Chest: Nontender, Movement normal; Abdomen: Soft, +diffuse tenderness to palp. No rebound or guarding. Nondistended, Normal bowel sounds; Genitourinary: No CVA tenderness; Extremities: Pulses normal, No tenderness, No edema, No calf edema or asymmetry.; Neuro: AA&Ox3, Major CN grossly intact.  Speech clear. No gross focal motor or sensory deficits in extremities.; Skin: Color normal, Warm, Dry.   ED Course  Procedures     MDM  MDM Reviewed: previous chart, nursing note and vitals Reviewed previous: labs Interpretation: labs     Results for orders placed or performed during the hospital encounter of 01/03/14  Urinalysis, Routine w reflex microscopic  Result Value Ref  Range   Color, Urine YELLOW YELLOW   APPearance CLEAR CLEAR   Specific Gravity, Urine 1.010 1.005 - 1.030   pH 6.0 5.0 - 8.0   Glucose, UA >1000 (A) NEGATIVE mg/dL   Hgb urine dipstick NEGATIVE NEGATIVE   Bilirubin Urine NEGATIVE NEGATIVE   Ketones, ur 40 (A) NEGATIVE mg/dL   Protein, ur NEGATIVE NEGATIVE mg/dL   Urobilinogen, UA 0.2 0.0 - 1.0 mg/dL   Nitrite NEGATIVE NEGATIVE   Leukocytes, UA NEGATIVE NEGATIVE  CBC with Differential  Result Value Ref Range   WBC 20.3 (H) 4.0 - 10.5 K/uL   RBC 4.65 4.22 - 5.81 MIL/uL   Hemoglobin 14.1 13.0 - 17.0 g/dL   HCT 40.9 39.0 - 52.0 %   MCV 88.0 78.0 - 100.0 fL   MCH 30.3 26.0 - 34.0 pg   MCHC 34.5 30.0 - 36.0 g/dL   RDW 14.5 11.5 - 15.5 %   Platelets 435 (H) 150 - 400 K/uL   Neutrophils Relative % 90 (H) 43 - 77 %   Neutro Abs 18.2 (H) 1.7 - 7.7 K/uL   Lymphocytes Relative 8 (L) 12 - 46 %   Lymphs Abs 1.5 0.7 - 4.0 K/uL   Monocytes Relative 2 (L) 3 - 12 %   Monocytes Absolute 0.5 0.1 - 1.0 K/uL   Eosinophils Relative 0 0 - 5 %   Eosinophils Absolute 0.0 0.0 - 0.7 K/uL   Basophils Relative 0 0 - 1 %   Basophils Absolute 0.1 0.0 - 0.1 K/uL  Basic metabolic panel  Result Value Ref Range   Sodium 138 137 - 147 mEq/L   Potassium 4.3 3.7 - 5.3 mEq/L   Chloride 89 (L) 96 - 112 mEq/L   CO2 27 19 - 32 mEq/L   Glucose, Bld 475 (H) 70 - 99 mg/dL   BUN 16 6 - 23 mg/dL   Creatinine, Ser 0.78 0.50 - 1.35 mg/dL   Calcium 10.2 8.4 - 10.5 mg/dL   GFR calc non Af Amer >90 >90 mL/min   GFR calc Af Amer >90 >90 mL/min   Anion gap 22 (H) 5 - 15  Lipase, blood  Result Value Ref Range   Lipase 10 (L) 11 - 59 U/L  Urine microscopic-add on  Result Value Ref Range   WBC, UA 0-2 <3 WBC/hpf   RBC / HPF 0-2 <3 RBC/hpf   US Abdomen Complete 12/20/2013   CLINICAL DATA:  Abdominal pain.  EXAM: ULTRASOUND ABDOMEN COMPLETE  COMPARISON:  Nuclear medicine gallbladder study  12/09/2013. CT of the abdomen and pelvis 11/29/2013. Acute abdominal series from  the same day. Gallbladder wall thickness is 1.6 mm, within normal limits.  FINDINGS: Gallbladder: No gallstones or wall thickening visualized. The gallbladder is mildly distended. The gallbladder wall thickness is within normal limits at 1.6 mm. The patient did exhibit a sonographic Murphy's sign with pressure over the gallbladder.  Common bile duct: Diameter: 3.1 mm, within normal limits  Liver: The liver is mildly enlarged, measuring up to 20.8 cm in length. This corresponds with the prior CT scan and may represent a Riedel lobe. No focal lesions are present.  IVC: No abnormality visualized.  Pancreas: The pancreas is poorly visualized due to overlying bowel gas. The visualized portions are within normal limits.  Spleen: The spleen is of normal size and echotexture, measuring 9.0 cm maximally, within normal limits  Right Kidney: Length: 12.6 cm, within normal limits. Echogenicity within normal limits. No mass or hydronephrosis visualized.  Left Kidney: Length: 13.0 cm, within normal limits. Echogenicity within normal limits. No mass or hydronephrosis visualized.  Abdominal aorta: No aneurysm visualized.  Other findings: None.  IMPRESSION: 1. Although there are no stones or gallbladder wall thickening, the patient is tender with pressure over the gallbladder. This is nonspecific but could suggest inflammation of the gallbladder. 2. Otherwise negative abdominal ultrasound.   Electronically Signed   By: Lawrence Santiago M.D.   On: 12/20/2013 12:10   Nm Hepato W/eject Fract 12/09/2013   CLINICAL DATA:  Right upper quadrant abdominal pain, nausea, vomiting for 7 months.  EXAM: NUCLEAR MEDICINE HEPATOBILIARY IMAGING WITH GALLBLADDER EF  TECHNIQUE: Sequential images of the abdomen were obtained out to 60 minutes following intravenous administration of radiopharmaceutical. After slow intravenous infusion of 1.68 micrograms Cholecystokinin, gallbladder ejection fraction was determined.  RADIOPHARMACEUTICALS:  5.0  [place] [id number] Choletec  COMPARISON:  CT 11/29/2013  FINDINGS: There is prompt uptake and excretion of radiotracer by the liver. No cystic duct or common bile duct obstruction. Gallbladder ejection fraction is 88%. At 30 min, normal ejection fraction is greater than 30%.  The patient did experience symptoms during CCK infusion.  IMPRESSION: Normal gallbladder ejection fraction.  The patient experienced worsening abdominal pain and vomiting with CCK administration.   Electronically Signed   By: Rolm Baptise M.D.   On: 12/09/2013 09:34    1455:  No frank hematemesis while in the ED; will dose IV pepcid and protonix for known erosive esophagitis. CBG trending downward after IVF. Will dose SQ insulin for elevated glucose with AG. WBC count elevated, but pt remains afebrile and without specific area of tenderness on abd exam; doubt acute surgical abd at this time. T/C to [facility] Dr. Wendee Beavers, case discussed, including:  HPI, pertinent PM/SHx, VS/PE, dx testing, ED course and treatment:  Agreeable to admit, requests to write temporary orders, obtain medical bed to team [facility].      Francine Graven, DO 01/05/14 [phone]

## 2014-01-03 NOTE — H&P (Signed)
[facility] History and Physical  Brent Daniels [id number] DOB: 1985/12/11 DOA: 01/03/2014  Referring physician: Dr. Thurnell Garbe PCP: [name], MD   Chief Complaint: Nausea and emesis  HPI: Brent Daniels is a 28 y.o. male  With history of diabetes mellitus, hypertension, GERD, back pain. Who presented to the hospital complaining of the above complaints. The patient recently was admitted for the same problem and diagnosed after EGD with esophagitis. He left AMA and presents today with the above complaints. The problem has been persistent. Eating and retching makes it worse. Nothing he is aware of makes it better. Patient has a lot of reported intolerances and allergies to medications including morphine. While I was evaluating patient was asking for Dilaudid and exhibited drug-seeking behaviors.  While in the ED patient was found to have an elevated white blood cell count of 20.3 and we were subsequently consulted for further evaluation recommendations.  Review of Systems:  Constitutional:  No weight loss, night sweats, Fevers, chills, fatigue.  HEENT:  No headaches, Difficulty swallowing,Tooth/dental problems,Sore throat,  No sneezing, itching, ear ache, nasal congestion, post nasal drip,  Cardio-vascular:  No chest pain, Orthopnea, PND, swelling in lower extremities, anasarca, dizziness, palpitations  GI:  No heartburn, indigestion, + abdominal discomfort, + nausea, + vomiting, diarrhea, change in bowel habits, loss of appetite  Resp:  No shortness of breath with exertion or at rest. No excess mucus, no productive cough, No non-productive cough, No coughing up of blood.No change in color of mucus.No wheezing.No chest wall deformity  Skin:  no rash or lesions.  GU:  no dysuria, change in color of urine, no urgency or frequency. No flank pain.  Musculoskeletal:  No joint pain or swelling. No decreased range of motion. No back pain.  Psych:  No change in mood or  affect. No depression or anxiety. No memory loss.   Past Medical History  Diagnosis Date  . Diabetes mellitus     type 1  . Arthritis   . GERD (gastroesophageal reflux disease)   . PUD (peptic ulcer disease)   . Hypertension   . Heart murmur     valvular stenosis  . Neuropathy   . Diabetic gastroparesis associated with type 1 diabetes mellitus   . S/P arthroscopic knee surgery 07/04/2011  . Scoliosis 07/11/2012  . Chronic back pain   . Chronic abdominal pain   . Nausea and vomiting     chronic, recurrent  . Acute esophagitis   . Gastroparesis   . Erosive esophagitis 12/23/2013    "severe" per EGD    Past Surgical History  Procedure Laterality Date  . Tympanostomy tube placement    . Knee arthroscopy  06/30/2011    Procedure: ARTHROSCOPY KNEE;  Surgeon: Carole Civil, MD;  Location: [facility];  Service: Orthopedics;  Laterality: Right;  diagnostic arthroscopy  . Esophagogastroduodenoscopy (egd) with propofol  06/17/2013    Dr. Oneida Alar: two gastric ulcers in fundus, mild antral gastritis, candida esophagitis  . Esophageal biopsy  06/17/2013    Procedure: GASTRIC ULCER AND ANTRAL BIOPSIES; ESOPHAGEAL BRUSHING;  Surgeon: Danie Binder, MD;  Location: [facility];  Service: Endoscopy;;  . Esophagogastroduodenoscopy (egd) with propofol N/A 09/11/2013    Dr. [name]: abnormal hypopharynx, question massively enlarged tonsils, stomach full of food precluded exam, needs EGD for verification of ulcer healing at a later date  . Esophagogastroduodenoscopy (egd) with propofol N/A 12/23/2013    Procedure: ESOPHAGOGASTRODUODENOSCOPY (EGD) WITH PROPOFOL;  Surgeon: Daneil Dolin, MD;  Location:  [facility];  Service: Endoscopy;  Laterality: N/A;  . Esophageal biopsy N/A 12/23/2013    Procedure: ESOPHAGEAL BIOPSY;  Surgeon: Daneil Dolin, MD;  Location: [facility];  Service: Endoscopy;  Laterality: N/A;   Social History:  reports that he has been smoking Cigarettes.  He has a 5 pack-year smoking history. He quit  smokeless tobacco use about 10 months ago. He reports that he does not drink alcohol or use illicit drugs.  Allergies  Allergen Reactions  . Hydrocodone Hives  . Tramadol Anaphylaxis and Other (See Comments)    Acid Reflux  . Ibuprofen Other (See Comments)    Stomach ulcers  . Naproxen Other (See Comments)    Stomach ulcers  . Sulfa Antibiotics Other (See Comments)    Stomach ulcers  . Morphine And Related Rash    Family History  Problem Relation Age of Onset  . Cancer Father   . Alcohol abuse Father   . Pseudochol deficiency Neg Hx   . Malignant hyperthermia Neg Hx   . Hypotension Neg Hx   . Anesthesia problems Neg Hx   . Colon cancer Neg Hx      Prior to Admission medications   Medication Sig Start Date End Date Taking? Authorizing Provider  bismuth subsalicylate (PEPTO BISMOL) 262 MG/15ML suspension Take 30 mLs by mouth every 6 (six) hours as needed for indigestion or diarrhea or loose stools.   Yes Historical Provider, MD  gabapentin (NEURONTIN) 800 MG tablet Take 800 mg by mouth 3 (three) times daily.   Yes Historical Provider, MD  insulin NPH-regular Human (NOVOLIN 70/30) (70-30) 100 UNIT/ML injection Inject 20 Units into the skin 2 (two) times daily with a meal. 15 units in the morning and 25 at night. Patient taking differently: Inject 15-25 Units into the skin 2 (two) times daily with a meal. 15 units in the morning and 25 at night. 11/17/13  Yes Kathie Dike, MD  insulin regular (NOVOLIN R,HUMULIN R) 100 units/mL injection Inject 2-10 Units into the skin 3 (three) times daily before meals. Sliding scale.   Yes Historical Provider, MD  lisinopril (PRINIVIL,ZESTRIL) 20 MG tablet Take 20 mg by mouth daily.   Yes Historical Provider, MD  metoCLOPramide (REGLAN) 10 MG tablet Take 10 mg by mouth 4 (four) times daily -  before meals and at bedtime.   Yes Historical Provider, MD  ondansetron (ZOFRAN) 4 MG tablet Take 1 tablet (4 mg total) by mouth 3 (three) times daily with  meals. Patient taking differently: Take 4 mg by mouth every 8 (eight) hours as needed for nausea or vomiting.  12/09/13  Yes Orvil Feil, NP  pantoprazole (PROTONIX) 40 MG tablet Take 1 tablet (40 mg total) by mouth 2 (two) times daily before a meal. 10/01/13  Yes Kathie Dike, MD  pravastatin (PRAVACHOL) 20 MG tablet Take 20 mg by mouth daily.  09/10/13 09/10/14 Yes Historical Provider, MD  promethazine (PHENERGAN) 25 MG tablet Take 1 tablet (25 mg total) by mouth every 6 (six) hours as needed for nausea or vomiting. 12/09/13  Yes Orvil Feil, NP  tiZANidine (ZANAFLEX) 4 MG tablet Take 1 tablet by mouth every 6 (six) hours as needed for muscle spasms.  07/30/13  Yes Historical Provider, MD   Physical Exam: Filed Vitals:   01/03/14 1325 01/03/14 1606  BP: 184/97 191/106  Pulse: 77 101  Temp: 97.9 F (36.6 C)   TempSrc: Oral   Resp: 16 18  Height: 6\' 2"  (1.88 m)  Weight: 77.111 kg (170 lb)   SpO2: 100% 100%    Wt Readings from Last 3 Encounters:  01/03/14 77.111 kg (170 lb)  12/23/13 73.9 kg (162 lb 14.7 oz)  12/09/13 79.833 kg (176 lb)    General:  Patient is alert and awake Eyes: PERRL, normal lids, irises & conjunctiva ENT: grossly normal hearing, lips & tongue Neck: no LAD, masses or thyromegaly Cardiovascular: RRR, no m/r/g. No LE edema. Telemetry: SR, no arrhythmias  Respiratory: CTA bilaterally, no w/r/r. Normal respiratory effort. Abdomen: soft, nondistended, generalized tenderness on palpation Skin: no rash or induration seen on limited exam Musculoskeletal: grossly normal tone BUE/BLE Psychiatric: grossly normal mood and affect, speech fluent and appropriate Neurologic: grossly non-focal.          Labs on Admission:  Basic Metabolic Panel:  Recent Labs Lab 01/03/14 1330  NA 138  K 4.3  CL 89*  CO2 27  GLUCOSE 475*  BUN 16  CREATININE 0.78  CALCIUM 10.2   Liver Function Tests:  Recent Labs Lab 01/03/14 1420  AST 15  ALT 15  ALKPHOS 99  BILITOT 0.9    PROT 8.4*  ALBUMIN 4.7    Recent Labs Lab 01/03/14 1330  LIPASE 10*   No results for input(s): AMMONIA in the last 168 hours. CBC:  Recent Labs Lab 01/03/14 1330  WBC 20.3*  NEUTROABS 18.2*  HGB 14.1  HCT 40.9  MCV 88.0  PLT 435*   Cardiac Enzymes: No results for input(s): CKTOTAL, CKMB, CKMBINDEX, TROPONINI in the last 168 hours.  BNP (last 3 results) No results for input(s): PROBNP in the last 8760 hours. CBG:  Recent Labs Lab 01/03/14 1458  GLUCAP 368*    Radiological Exams on Admission: No results found.  EKG: Independently reviewed. Sinus tachycardia with no ST elevations or depressions.  Assessment/Plan  Leukocytosis - At this juncture could be stress reaction to nausea and emesis. - Nonetheless we'll cover with Cipro and Flagyl given history of esophagitis. - Reassess white blood cell count levels next a.m.    DM (diabetes mellitus) - Currently not well controlled - We'll place on NovoLog 70/30 at 15 units. Subcutaneous twice a day - Clear liquid diet  Esophagitis - As per recommendations from gastroenterologist will place on sucralfate - Most likely contributing to principal problem  Active Problems:   Essential hypertension - Continue home regimen -We'll add hydralazine 10 mg IV to be administered every 6 hours as needed for elevated blood pressure please see orders for specific 8    Back pain - Patient has a lot of drug allergies to opioids and unable to take NSAIDs as such will treat pain with acetaminophen    GERD (gastroesophageal reflux disease) - Patient will be given Protonix 40 mg by mouth twice a day if unable to tolerate by mouth we will change to IV    Intractable nausea and vomiting - We'll treat supportively Zofran on board either by mouth or IV depending on route which patient is able to tolerate      Code Status: full DVT Prophylaxis: SCD's Family Communication: none at bedside Disposition Plan: Med surg  Time spent:  > 45 minutes  Velvet Bathe [facility] Pager [phone]

## 2014-01-03 NOTE — ED Notes (Signed)
Pt started vomiting last night and noticed blood in vomit this morning.

## 2014-01-04 LAB — BASIC METABOLIC PANEL
ANION GAP: 17 — AB (ref 5–15)
BUN: 13 mg/dL (ref 6–23)
CHLORIDE: 97 meq/L (ref 96–112)
CO2: 27 meq/L (ref 19–32)
Calcium: 9.1 mg/dL (ref 8.4–10.5)
Creatinine, Ser: 0.65 mg/dL (ref 0.50–1.35)
GFR calc non Af Amer: >90 mL/min (ref >90)
Glucose, Bld: 268 mg/dL — ABNORMAL HIGH (ref 70–99)
POTASSIUM: 3.7 meq/L (ref 3.7–5.3)
SODIUM: 141 meq/L (ref 137–147)

## 2014-01-04 LAB — CBC
HEMATOCRIT: 36.2 % — AB (ref 39.0–52.0)
HEMOGLOBIN: 12.3 g/dL — AB (ref 13.0–17.0)
MCH: 30.3 pg (ref 26.0–34.0)
MCHC: 34 g/dL (ref 30.0–36.0)
MCV: 89.2 fL (ref 78.0–100.0)
Platelets: 366 10*3/uL (ref 150–400)
RBC: 4.06 MIL/uL — AB (ref 4.22–5.81)
RDW: 14.9 % (ref 11.5–15.5)
WBC: 16.6 10*3/uL — AB (ref 4.0–10.5)

## 2014-01-04 LAB — GLUCOSE, CAPILLARY
GLUCOSE-CAPILLARY: 324 mg/dL — AB (ref 70–99)
Glucose-Capillary: 165 mg/dL — ABNORMAL HIGH (ref 70–99)
Glucose-Capillary: 218 mg/dL — ABNORMAL HIGH (ref 70–99)

## 2014-01-04 MED ORDER — LIDOCAINE VISCOUS 2 % MT SOLN
15.0000 mL | Freq: Four times a day (QID) | OROMUCOSAL | Status: DC | PRN
  Administered 2014-01-04: 15 mL via OROMUCOSAL
  Filled 2014-01-04: qty 15

## 2014-01-04 MED ORDER — HYDROMORPHONE HCL 1 MG/ML IJ SOLN
1.0000 mg | INTRAMUSCULAR | Status: DC | PRN
  Administered 2014-01-04 (×3): 1 mg via INTRAVENOUS
  Filled 2014-01-04 (×4): qty 1

## 2014-01-04 NOTE — Discharge Planning (Addendum)
Pt asked for his pain meds and I noticed that his orders had recently expired. Dr. Was called to get meds re-started.  When pt was informed that meds would not be restarted.  Pt ripped IV out and proceeded to walk out of the hospital.  Pt did sign AMA paperwork and was informed of possible risks of leaving against dr. Kayleen Memos or before care was complete. Dr. Informed of pt leaving AMA.   Pt's full dose of 1mg  dilaudid was pulled - but unable to give, per Dr. Kayleen Memos OR waste (since pt left AMA and was  from pyxis  - prior to attempting to waste. [facility] and instructed to write progress note and waste in sink.  Witnessed by Fransisca Connors, RN. Full dose of 1mg  dilaudid wasted in sink.

## 2014-01-04 NOTE — Progress Notes (Signed)
Witnessed waste of 1 mg dilaudid in sink

## 2014-01-04 NOTE — Progress Notes (Signed)
[facility] PROGRESS NOTE  ZAYAAN SANGHERA [id number] DOB: Apr 27, 1985 DOA: 01/03/2014 PCP: [name], MD  Assessment/Plan: Esophagitis - We'll continue current medical management and supportive therapy. - We'll add viscous lidocaine - Advance diet as tolerated   Leukocytosis -Patient was placed on Cipro and Flagyl. Currently white blood cell count trending down.  Active Problems:   Essential hypertension - Patient is currently on lisinopril scheduled and hydralazine when necessary    DM (diabetes mellitus) - Continue sliding scale insulin and daily at bedtime coverage. - Currently on clear liquid diet -We'll continue NovoLog 70/30 at 15 units subcutaneous twice a day. Should blood sugars continue to be elevated we'll plan on increasing    Back pain - Tylenol    GERD (gastroesophageal reflux disease) - Most likely cause of principal problem listed above. We'll continue PPI    Intractable nausea and vomiting - Continue Zofran    Code Status: full Family Communication: None at bedside Disposition Plan: Pending continued improvement. We'll try to advance diet   Consultants:  None  Procedures:  None  Antibiotics:  Cipro and Flagyl  HPI/Subjective: Patient reports feeling better today.  Objective: Filed Vitals:   01/04/14 1425  BP: 153/89  Pulse: 94  Temp: 99.4 F (37.4 C)  Resp: 18    Intake/Output Summary (Last 24 hours) at 01/04/14 1608 Last data filed at 01/04/14 1354  Gross per 24 hour  Intake    360 ml  Output      0 ml  Net    360 ml   Filed Weights   01/03/14 1325 01/03/14 1659  Weight: 77.111 kg (170 lb) 77.11 kg (170 lb)    Exam:   General:  Pt in nad, alert and awake  Cardiovascular: rrr, no mrg  Respiratory: cta bl, no wheezes  Abdomen: soft, NT, ND  Musculoskeletal: no cyanosis or clubbing   Data Reviewed: Basic Metabolic Panel:  Recent Labs Lab 01/03/14 1330 01/04/14 0554  NA 138 141  K 4.3 3.7   CL 89* 97  CO2 27 27  GLUCOSE 475* 268*  BUN 16 13  CREATININE 0.78 0.65  CALCIUM 10.2 9.1   Liver Function Tests:  Recent Labs Lab 01/03/14 1420  AST 15  ALT 15  ALKPHOS 99  BILITOT 0.9  PROT 8.4*  ALBUMIN 4.7    Recent Labs Lab 01/03/14 1330  LIPASE 10*   No results for input(s): AMMONIA in the last 168 hours. CBC:  Recent Labs Lab 01/03/14 1330 01/04/14 0554  WBC 20.3* 16.6*  NEUTROABS 18.2*  --   HGB 14.1 12.3*  HCT 40.9 36.2*  MCV 88.0 89.2  PLT 435* 366   Cardiac Enzymes: No results for input(s): CKTOTAL, CKMB, CKMBINDEX, TROPONINI in the last 168 hours. BNP (last 3 results) No results for input(s): PROBNP in the last 8760 hours. CBG:  Recent Labs Lab 01/03/14 1458 01/03/14 1747 01/03/14 2027 01/04/14 0744 01/04/14 1130  GLUCAP 368* 240* 169* 324* 165*    No results found for this or any previous visit (from the past 240 hour(s)).   Studies: No results found.  Scheduled Meds: . sodium chloride   Intravenous STAT  . ciprofloxacin  400 mg Intravenous Q12H  . gabapentin  800 mg Oral TID  . insulin aspart  0-15 Units Subcutaneous TID WC  . insulin aspart  0-5 Units Subcutaneous QHS  . insulin aspart protamine- aspart  15 Units Subcutaneous BID WC  . lisinopril  20 mg Oral Daily  . metoCLOPramide  10 mg Oral TID AC & HS  . metronidazole  500 mg Intravenous Q8H  . pantoprazole  40 mg Oral BID AC  . pravastatin  20 mg Oral q1800  . sucralfate  1 g Oral TID WC & HS   Continuous Infusions: . sodium chloride 75 mL/hr at 01/04/14 0527    Time spent: > 35 minutes    Velvet Bathe  [facility] Pager [phone] If 7PM-7AM, please contact night-coverage at [email or web address], password [facility] 01/04/2014, 4:08 PM  LOS: 1 day

## 2014-01-05 NOTE — Discharge Summary (Signed)
Patient left AMA on 11/29 2015.   Was evaluated earlier that day prior to leaving. Patient has multiple opioid allergies to morphine, fentanyl, tramadol. Was requesting specifically Dilaudid of which I did not agree to continue. Patient's pain was treated with acetaminophen as well as viscous lidocaine. The patient left minutes after Dilaudid was discontinued. Patient exhibited drug-seeking behaviors and in the future I would avoid prescribing Dilaudid to this patient as he has multiple opioid allergies and reported history of diabetic gastroparesis.

## 2014-01-07 NOTE — Care Management Utilization Note (Signed)
UR completed 

## 2014-01-21 NOTE — H&P (Signed)
  NTS SOAP Note  Vital Signs:  Vitals as of: [id number]: Systolic [id number]: Diastolic 83: Heart Rate 83: Temp 98.2F: Height 39ft 1.5in: Weight 176Lbs 0 Ounces: Pain Level 9: BMI 22.91  BMI : 22.91 kg/m2  Subjective: This 28 year old male presents for of upper abdominal pain.  Has been having right upper and epigastric abdominal pain and nausea for several months.  Has difficulty eating.  U/S negative.  HIDA showed normal ejection fraction but significant reproduction of his symptoms with cck injection.  No fever,  chills,  jaundice.  Review of Symptoms:  Constitutional:unremarkable   Head:unremarkable Eyes:unremarkable   Nose/Mouth/Throat:unremarkable Cardiovascular:  unremarkable Respiratory:unremarkable Gastrointestinabdominal pain, nausea, vomiting, heartburn Genitourinary:unremarkable   joint,  back,  and neck pain Hematolgic/Lymphatic:unremarkable   Allergic/Immunologic:unremarkable   Past Medical History:  Reviewed  Past Medical History  Surgical History: none Medical Problems: IDDM,  HTN,  high cholesterol  Allergies: hydrocodone,  morphine,  NSAID,  sulfa,  tramadol  Medications: insulin,  gabapentin,  protonix,  pravastatin   Social History:Reviewed  Social History  Preferred Language: English Race:  White Ethnicity: Not Hispanic / Latino Age: 58 year Marital Status:  S Alcohol: no   Smoking Status: Current every day smoker reviewed on 01/15/2014 Started Date:  Packs per day: 0.50 Functional Status reviewed on 01/15/2014 ------------------------------------------------ Bathing: Normal Cooking: Normal Dressing: Normal Driving: Normal Eating: Normal Managing Meds: Normal Oral Care: Normal Shopping: Normal Toileting: Normal Transferring: Normal Walking: Normal Cognitive Status reviewed on 01/15/2014 ------------------------------------------------ Attention: Normal Decision Making: Normal Language: Normal Memory: Normal Motor:  Normal Perception: Normal Problem Solving: Normal Visual and Spatial: Normal   Family History:Reviewed  Family Health History Mother, Living; Healthy;  Father, Living; Healthy;     Objective Information: General:Well appearing, well nourished in no distress. no scleral icterus Heart:RRR, no murmur or gallop.  Normal S1, S2.  No S3, S4.  Lungs:  CTA bilaterally, no wheezes, rhonchi, rales.  Breathing unlabored. Abdomen:Soft, tender in right upper quadrant to palpation,  ND, normal bowel sounds, no HSM, no masses.  No peritoneal signs.  Assessment:Chronic cholecystitis  Diagnoses: 575.11  K81.1 Chronic cholecystitis (Chronic cholecystitis)  Procedures: [id number] - OFFICE OUTPATIENT NEW 30 MINUTES    Plan:  Scheduled for laparoscopic cholecystectomy on 01/28/14.   Patient Education:Alternative treatments to surgery were discussed with patient (and family).  Risks and benefits  of procedure iincluding bleeding,  infection,  hepatobiliary injury,  the possibility of an open procedure,  and recurrence of the pain were fully explained to the patient (and family) who gave informed consent. Patient/family questions were addressed.  Follow-up:Pending Surgery

## 2014-01-23 ENCOUNTER — Encounter (HOSPITAL_COMMUNITY)
Admission: RE | Admit: 2014-01-23 | Discharge: 2014-01-23 | Disposition: A | Discharge status: Home / Self Care | Payer: Managed Care, Other (non HMO) | Source: Ambulatory Visit | Attending: General Surgery | Admitting: General Surgery

## 2014-01-23 ENCOUNTER — Encounter (HOSPITAL_COMMUNITY): Payer: Self-pay

## 2014-01-23 DIAGNOSIS — Z01812 Encounter for preprocedural laboratory examination: Secondary | ICD-10-CM | POA: Diagnosis present

## 2014-01-23 HISTORY — DX: Anxiety disorder, unspecified: F41.9

## 2014-01-23 HISTORY — DX: Insomnia, unspecified: G47.00

## 2014-01-23 HISTORY — DX: Pure hypercholesterolemia, unspecified: E78.00

## 2014-01-23 LAB — HEPATIC FUNCTION PANEL
ALT: 13 U/L (ref 0–53)
AST: 17 U/L (ref 0–37)
Albumin: 3.5 g/dL (ref 3.5–5.2)
Alkaline Phosphatase: 94 U/L (ref 39–117)
Bilirubin, Direct: <0.2 mg/dL (ref 0.0–0.3)
TOTAL PROTEIN: 6.7 g/dL (ref 6.0–8.3)
Total Bilirubin: 0.3 mg/dL (ref 0.3–1.2)

## 2014-01-23 LAB — BASIC METABOLIC PANEL
Anion gap: 15 (ref 5–15)
BUN: 15 mg/dL (ref 6–23)
CHLORIDE: 93 meq/L — AB (ref 96–112)
CO2: 26 meq/L (ref 19–32)
CREATININE: 0.73 mg/dL (ref 0.50–1.35)
Calcium: 9.5 mg/dL (ref 8.4–10.5)
GFR calc Af Amer: >90 mL/min (ref >90)
GFR calc non Af Amer: >90 mL/min (ref >90)
Glucose, Bld: 531 mg/dL — ABNORMAL HIGH (ref 70–99)
POTASSIUM: 4.6 meq/L (ref 3.7–5.3)
Sodium: 134 mEq/L — ABNORMAL LOW (ref 137–147)

## 2014-01-23 LAB — CBC WITH DIFFERENTIAL/PLATELET
BASOS PCT: 1 % (ref 0–1)
Basophils Absolute: 0.1 10*3/uL (ref 0.0–0.1)
Eosinophils Absolute: 0.5 10*3/uL (ref 0.0–0.7)
Eosinophils Relative: 4 % (ref 0–5)
HEMATOCRIT: 37.9 % — AB (ref 39.0–52.0)
Hemoglobin: 12.4 g/dL — ABNORMAL LOW (ref 13.0–17.0)
LYMPHS ABS: 2.5 10*3/uL (ref 0.7–4.0)
Lymphocytes Relative: 20 % (ref 12–46)
MCH: 30 pg (ref 26.0–34.0)
MCHC: 32.7 g/dL (ref 30.0–36.0)
MCV: 91.5 fL (ref 78.0–100.0)
MONO ABS: 0.6 10*3/uL (ref 0.1–1.0)
MONOS PCT: 5 % (ref 3–12)
Neutro Abs: 9.1 10*3/uL — ABNORMAL HIGH (ref 1.7–7.7)
Neutrophils Relative %: 70 % (ref 43–77)
Platelets: 314 10*3/uL (ref 150–400)
RBC: 4.14 MIL/uL — AB (ref 4.22–5.81)
RDW: 14 % (ref 11.5–15.5)
WBC: 12.8 10*3/uL — ABNORMAL HIGH (ref 4.0–10.5)

## 2014-01-23 NOTE — Pre-Procedure Instructions (Signed)
Patient given information to sign up for my chart at home. 

## 2014-01-23 NOTE — Patient Instructions (Signed)
Brent Daniels  01/23/2014   Your procedure is scheduled on:  01/28/2014  Report to [facility] at  4  AM.  Call this number if you have problems the morning of surgery: [phone]   Remember:   Do not eat food or drink liquids after midnight.   Take these medicines the morning of surgery with A SIP OF WATER:  Gabapentin, lisinopril, reglan, zofran, oxycodone, protonix, phenergan, zanaflex. Take 1/2 of your insulin the night before.   Do not wear jewelry, make-up or nail polish.  Do not wear lotions, powders, or perfumes.   Do not shave 48 hours prior to surgery. Men may shave face and neck.  Do not bring valuables to the hospital.  [facility] is not responsible for any belongings or valuables.               Contacts, dentures or bridgework may not be worn into surgery.  Leave suitcase in the car. After surgery it may be brought to your room.  For patients admitted to the hospital, discharge time is determined by your treatment team.               Patients discharged the day of surgery will not be allowed to drive home.  Name and phone number of your driver: family  Special Instructions: Shower using CHG 2 nights before surgery and the night before surgery.  If you shower the day of surgery use CHG.  Use special wash - you have one bottle of CHG for all showers.  You should use approximately 1/3 of the bottle for each shower.   Please read over the following fact sheets that you were given: Pain Booklet, Coughing and Deep Breathing, Surgical Site Infection Prevention, Anesthesia Post-op Instructions and Care and Recovery After Surgery Laparoscopic Cholecystectomy Laparoscopic cholecystectomy is surgery to remove the gallbladder. The gallbladder is located in the upper right part of the abdomen, behind the liver. It is a storage sac for bile produced in the liver. Bile aids in the digestion and absorption of fats. Cholecystectomy is often done for inflammation of the  gallbladder (cholecystitis). This condition is usually caused by a buildup of gallstones (cholelithiasis) in your gallbladder. Gallstones can block the flow of bile, resulting in inflammation and pain. In severe cases, emergency surgery may be required. When emergency surgery is not required, you will have time to prepare for the procedure. Laparoscopic surgery is an alternative to open surgery. Laparoscopic surgery has a shorter recovery time. Your common bile duct may also need to be examined during the procedure. If stones are found in the common bile duct, they may be removed. LET [facility] CARE PROVIDER KNOW ABOUT:  Any allergies you have.  All medicines you are taking, including vitamins, herbs, eye drops, creams, and over-the-counter medicines.  Previous problems you or members of your family have had with the use of anesthetics.  Any blood disorders you have.  Previous surgeries you have had.  Medical conditions you have. RISKS AND COMPLICATIONS Generally, this is a safe procedure. However, as with any procedure, complications can occur. Possible complications include:  Infection.  Damage to the common bile duct, nerves, arteries, veins, or other internal organs such as the stomach, liver, or intestines.  Bleeding.  A stone may remain in the common bile duct.  A bile leak from the cyst duct that is clipped when your gallbladder is removed.  The need to convert to open surgery, which requires a  larger incision in the abdomen. This may be necessary if your surgeon thinks it is not safe to continue with a laparoscopic procedure. BEFORE THE PROCEDURE  Ask your health care provider about changing or stopping any regular medicines. You will need to stop taking aspirin or blood thinners at least 5 days prior to surgery.  Do not eat or drink anything after midnight the night before surgery.  Let your health care provider know if you develop a cold or other infectious problem  before surgery. PROCEDURE   You will be given medicine to make you sleep through the procedure (general anesthetic). A breathing tube will be placed in your mouth.  When you are asleep, your surgeon will make several small cuts (incisions) in your abdomen.  A thin, lighted tube with a tiny camera on the end (laparoscope) is inserted through one of the small incisions. The camera on the laparoscope sends a picture to a TV screen in the operating room. This gives the surgeon a good view inside your abdomen.  A gas will be pumped into your abdomen. This expands your abdomen so that the surgeon has more room to perform the surgery.  Other tools needed for the procedure are inserted through the other incisions. The gallbladder is removed through one of the incisions.  After the removal of your gallbladder, the incisions will be closed with stitches, staples, or skin glue. AFTER THE PROCEDURE  You will be taken to a recovery area where your progress will be checked often.  You may be allowed to go home the same day if your pain is controlled and you can tolerate liquids. Document Released: 01/23/2005 Document Revised: 11/13/2012 Document Reviewed: 09/04/2012 [facility] Patient Information 2015 [place], [place]. This information is not intended to replace advice given to you by your health care provider. Make sure you discuss any questions you have with your health care provider. PATIENT INSTRUCTIONS POST-ANESTHESIA  IMMEDIATELY FOLLOWING SURGERY:  Do not drive or operate machinery for the first twenty four hours after surgery.  Do not make any important decisions for twenty four hours after surgery or while taking narcotic pain medications or sedatives.  If you develop intractable nausea and vomiting or a severe headache please notify your doctor immediately.  FOLLOW-UP:  Please make an appointment with your surgeon as instructed. You do not need to follow up with anesthesia unless specifically  instructed to do so.  WOUND CARE INSTRUCTIONS (if applicable):  Keep a dry clean dressing on the anesthesia/puncture wound site if there is drainage.  Once the wound has quit draining you may leave it open to air.  Generally you should leave the bandage intact for twenty four hours unless there is drainage.  If the epidural site drains for more than 36-48 hours please call the anesthesia department.  QUESTIONS?:  Please feel free to call your physician or the hospital operator if you have any questions, and they will be happy to assist you.

## 2014-01-28 ENCOUNTER — Ambulatory Visit (HOSPITAL_COMMUNITY): Payer: Managed Care, Other (non HMO) | Admitting: Anesthesiology

## 2014-01-28 ENCOUNTER — Ambulatory Visit (HOSPITAL_COMMUNITY)
Admission: RE | Admit: 2014-01-28 | Discharge: 2014-01-28 | Disposition: A | Discharge status: Home / Self Care | Payer: Managed Care, Other (non HMO) | Source: Ambulatory Visit | Attending: General Surgery | Admitting: General Surgery

## 2014-01-28 ENCOUNTER — Encounter (HOSPITAL_COMMUNITY)
Admission: RE | Disposition: A | Discharge status: Home / Self Care | Payer: Self-pay | Source: Ambulatory Visit | Attending: General Surgery

## 2014-01-28 ENCOUNTER — Encounter (HOSPITAL_COMMUNITY): Payer: Self-pay

## 2014-01-28 DIAGNOSIS — F17200 Nicotine dependence, unspecified, uncomplicated: Secondary | ICD-10-CM | POA: Diagnosis not present

## 2014-01-28 DIAGNOSIS — Z79899 Other long term (current) drug therapy: Secondary | ICD-10-CM | POA: Diagnosis not present

## 2014-01-28 DIAGNOSIS — E780 Pure hypercholesterolemia: Secondary | ICD-10-CM | POA: Insufficient documentation

## 2014-01-28 DIAGNOSIS — K219 Gastro-esophageal reflux disease without esophagitis: Secondary | ICD-10-CM | POA: Diagnosis not present

## 2014-01-28 DIAGNOSIS — I10 Essential (primary) hypertension: Secondary | ICD-10-CM | POA: Insufficient documentation

## 2014-01-28 DIAGNOSIS — K811 Chronic cholecystitis: Secondary | ICD-10-CM | POA: Diagnosis not present

## 2014-01-28 DIAGNOSIS — K7689 Other specified diseases of liver: Secondary | ICD-10-CM | POA: Diagnosis not present

## 2014-01-28 DIAGNOSIS — Z87891 Personal history of nicotine dependence: Secondary | ICD-10-CM | POA: Diagnosis not present

## 2014-01-28 DIAGNOSIS — Z794 Long term (current) use of insulin: Secondary | ICD-10-CM | POA: Insufficient documentation

## 2014-01-28 DIAGNOSIS — E108 Type 1 diabetes mellitus with unspecified complications: Secondary | ICD-10-CM | POA: Diagnosis not present

## 2014-01-28 DIAGNOSIS — E1065 Type 1 diabetes mellitus with hyperglycemia: Secondary | ICD-10-CM | POA: Insufficient documentation

## 2014-01-28 HISTORY — PX: CHOLECYSTECTOMY: SHX55

## 2014-01-28 LAB — GLUCOSE, CAPILLARY
GLUCOSE-CAPILLARY: 308 mg/dL — AB (ref 70–99)
GLUCOSE-CAPILLARY: 372 mg/dL — AB (ref 70–99)
Glucose-Capillary: 394 mg/dL — ABNORMAL HIGH (ref 70–99)
Glucose-Capillary: 282 mg/dL — ABNORMAL HIGH (ref 70–99)

## 2014-01-28 LAB — GLUCOSE, RANDOM: Glucose, Bld: 307 mg/dL — ABNORMAL HIGH (ref 70–99)

## 2014-01-28 SURGERY — LAPAROSCOPIC CHOLECYSTECTOMY
Anesthesia: General

## 2014-01-28 MED ORDER — SUCCINYLCHOLINE CHLORIDE 20 MG/ML IJ SOLN
INTRAMUSCULAR | Status: AC
  Filled 2014-01-28: qty 1

## 2014-01-28 MED ORDER — INSULIN ASPART 100 UNIT/ML ~~LOC~~ SOLN
8.0000 [IU] | Freq: Once | SUBCUTANEOUS | Status: AC
  Administered 2014-01-28: 8 [IU] via SUBCUTANEOUS

## 2014-01-28 MED ORDER — POVIDONE-IODINE 10 % OINT PACKET
TOPICAL_OINTMENT | CUTANEOUS | Status: DC | PRN
  Administered 2014-01-28: 1 via TOPICAL

## 2014-01-28 MED ORDER — FENTANYL CITRATE 0.05 MG/ML IJ SOLN
INTRAMUSCULAR | Status: AC
  Filled 2014-01-28: qty 2

## 2014-01-28 MED ORDER — GLYCOPYRROLATE 0.2 MG/ML IJ SOLN
INTRAMUSCULAR | Status: DC | PRN
  Administered 2014-01-28: .6 mg via INTRAVENOUS

## 2014-01-28 MED ORDER — ROCURONIUM BROMIDE 100 MG/10ML IV SOLN
INTRAVENOUS | Status: DC | PRN
  Administered 2014-01-28: 23 mg via INTRAVENOUS
  Administered 2014-01-28: 7 mg via INTRAVENOUS

## 2014-01-28 MED ORDER — LIDOCAINE HCL 1 % IJ SOLN
INTRAMUSCULAR | Status: DC | PRN
  Administered 2014-01-28: 40 mg via INTRADERMAL

## 2014-01-28 MED ORDER — ONDANSETRON HCL 4 MG/2ML IJ SOLN
INTRAMUSCULAR | Status: AC
  Filled 2014-01-28: qty 2

## 2014-01-28 MED ORDER — ROCURONIUM BROMIDE 50 MG/5ML IV SOLN
INTRAVENOUS | Status: AC
  Filled 2014-01-28: qty 1

## 2014-01-28 MED ORDER — HEMOSTATIC AGENTS (NO CHARGE) OPTIME
TOPICAL | Status: DC | PRN
  Administered 2014-01-28: 1 via TOPICAL

## 2014-01-28 MED ORDER — PROMETHAZINE HCL 25 MG/ML IJ SOLN
INTRAMUSCULAR | Status: AC
  Filled 2014-01-28: qty 1

## 2014-01-28 MED ORDER — BUPIVACAINE HCL (PF) 0.5 % IJ SOLN
INTRAMUSCULAR | Status: AC
  Filled 2014-01-28: qty 30

## 2014-01-28 MED ORDER — POVIDONE-IODINE 10 % EX OINT
TOPICAL_OINTMENT | CUTANEOUS | Status: AC
  Filled 2014-01-28: qty 1

## 2014-01-28 MED ORDER — SCOPOLAMINE 1 MG/3DAYS TD PT72
MEDICATED_PATCH | TRANSDERMAL | Status: AC
  Filled 2014-01-28: qty 1

## 2014-01-28 MED ORDER — SCOPOLAMINE 1 MG/3DAYS TD PT72
1.0000 | MEDICATED_PATCH | TRANSDERMAL | Status: DC
  Administered 2014-01-28: 1.5 mg via TRANSDERMAL

## 2014-01-28 MED ORDER — PROMETHAZINE HCL 25 MG/ML IJ SOLN
12.5000 mg | Freq: Once | INTRAMUSCULAR | Status: AC
  Administered 2014-01-28: 12.5 mg via INTRAVENOUS

## 2014-01-28 MED ORDER — HYDROMORPHONE HCL 1 MG/ML IJ SOLN
INTRAMUSCULAR | Status: AC
  Filled 2014-01-28: qty 1

## 2014-01-28 MED ORDER — SUCCINYLCHOLINE CHLORIDE 20 MG/ML IJ SOLN
INTRAMUSCULAR | Status: DC | PRN
  Administered 2014-01-28: 120 mg via INTRAVENOUS

## 2014-01-28 MED ORDER — PROPOFOL 10 MG/ML IV EMUL
INTRAVENOUS | Status: AC
  Filled 2014-01-28: qty 20

## 2014-01-28 MED ORDER — FENTANYL CITRATE 0.05 MG/ML IJ SOLN
INTRAMUSCULAR | Status: AC
  Filled 2014-01-28: qty 5

## 2014-01-28 MED ORDER — MIDAZOLAM HCL 2 MG/2ML IJ SOLN
INTRAMUSCULAR | Status: AC
  Filled 2014-01-28: qty 2

## 2014-01-28 MED ORDER — FENTANYL CITRATE 0.05 MG/ML IJ SOLN
INTRAMUSCULAR | Status: DC | PRN
  Administered 2014-01-28 (×7): 50 ug via INTRAVENOUS

## 2014-01-28 MED ORDER — SODIUM CHLORIDE 0.9 % IR SOLN
Status: DC | PRN
  Administered 2014-01-28: 1000 mL

## 2014-01-28 MED ORDER — LIDOCAINE HCL (PF) 1 % IJ SOLN
INTRAMUSCULAR | Status: AC
  Filled 2014-01-28: qty 5

## 2014-01-28 MED ORDER — CIPROFLOXACIN IN D5W 400 MG/200ML IV SOLN
INTRAVENOUS | Status: AC
  Filled 2014-01-28: qty 200

## 2014-01-28 MED ORDER — CIPROFLOXACIN IN D5W 400 MG/200ML IV SOLN
400.0000 mg | INTRAVENOUS | Status: AC
  Administered 2014-01-28: 400 mg via INTRAVENOUS

## 2014-01-28 MED ORDER — ONDANSETRON HCL 4 MG/2ML IJ SOLN: 4.0000 mg | Freq: Once | INTRAMUSCULAR | Status: DC | PRN

## 2014-01-28 MED ORDER — HYDROMORPHONE HCL 1 MG/ML IJ SOLN
0.5000 mg | INTRAMUSCULAR | Status: AC | PRN
  Administered 2014-01-28 (×4): 0.5 mg via INTRAVENOUS

## 2014-01-28 MED ORDER — ONDANSETRON HCL 4 MG/2ML IJ SOLN
4.0000 mg | Freq: Once | INTRAMUSCULAR | Status: AC
  Administered 2014-01-28: 4 mg via INTRAVENOUS

## 2014-01-28 MED ORDER — CHLORHEXIDINE GLUCONATE 4 % EX LIQD: 1.0000 "application " | Freq: Once | CUTANEOUS | Status: DC

## 2014-01-28 MED ORDER — OXYCODONE-ACETAMINOPHEN 10-325 MG PO TABS: 1.0000 | ORAL_TABLET | ORAL | Status: DC | PRN

## 2014-01-28 MED ORDER — BUPIVACAINE HCL (PF) 0.5 % IJ SOLN
INTRAMUSCULAR | Status: DC | PRN
  Administered 2014-01-28: 10 mL

## 2014-01-28 MED ORDER — MIDAZOLAM HCL 2 MG/2ML IJ SOLN
1.0000 mg | INTRAMUSCULAR | Status: DC | PRN
  Administered 2014-01-28 (×2): 2 mg via INTRAVENOUS
  Filled 2014-01-28: qty 2

## 2014-01-28 MED ORDER — LACTATED RINGERS IV SOLN
INTRAVENOUS | Status: DC
  Administered 2014-01-28 (×2): via INTRAVENOUS

## 2014-01-28 MED ORDER — NEOSTIGMINE METHYLSULFATE 10 MG/10ML IV SOLN
INTRAVENOUS | Status: DC | PRN
  Administered 2014-01-28: 3 mg via INTRAVENOUS

## 2014-01-28 MED ORDER — FENTANYL CITRATE 0.05 MG/ML IJ SOLN
25.0000 ug | INTRAMUSCULAR | Status: DC | PRN
  Administered 2014-01-28 (×4): 50 ug via INTRAVENOUS

## 2014-01-28 MED ORDER — PROPOFOL 10 MG/ML IV BOLUS
INTRAVENOUS | Status: DC | PRN
  Administered 2014-01-28: 50 mg via INTRAVENOUS
  Administered 2014-01-28: 200 mg via INTRAVENOUS

## 2014-01-28 MED ORDER — ONDANSETRON HCL 4 MG/2ML IJ SOLN
INTRAMUSCULAR | Status: DC | PRN
  Administered 2014-01-28: 4 mg via INTRAVENOUS

## 2014-01-28 SURGICAL SUPPLY — 46 items
APPLIER CLIP LAPSCP 10X32 DD (CLIP) ×3 IMPLANT
BAG HAMPER (MISCELLANEOUS) ×3 IMPLANT
BAG SPEC RTRVL LRG 6X4 10 (ENDOMECHANICALS) ×1
BLADE 11 SAFETY STRL DISP (BLADE) ×3 IMPLANT
CHLORAPREP W/TINT 26ML (MISCELLANEOUS) ×3 IMPLANT
CLOTH BEACON ORANGE TIMEOUT ST (SAFETY) ×3 IMPLANT
COVER LIGHT HANDLE STERIS (MISCELLANEOUS) ×6 IMPLANT
DECANTER SPIKE VIAL GLASS SM (MISCELLANEOUS) ×3 IMPLANT
ELECT REM PT RETURN 9FT ADLT (ELECTROSURGICAL) ×3
ELECTRODE REM PT RTRN 9FT ADLT (ELECTROSURGICAL) ×1 IMPLANT
FILTER SMOKE EVAC LAPAROSHD (FILTER) ×3 IMPLANT
FORMALIN 10 PREFIL 120ML (MISCELLANEOUS) ×3 IMPLANT
GLOVE BIO SURGEON STRL SZ7 (GLOVE) ×2 IMPLANT
GLOVE BIOGEL PI IND STRL 7.0 (GLOVE) IMPLANT
GLOVE BIOGEL PI IND STRL 7.5 (GLOVE) IMPLANT
GLOVE BIOGEL PI IND STRL 8 (GLOVE) IMPLANT
GLOVE BIOGEL PI INDICATOR 7.0 (GLOVE) ×4
GLOVE BIOGEL PI INDICATOR 7.5 (GLOVE) ×2
GLOVE BIOGEL PI INDICATOR 8 (GLOVE) ×2
GLOVE SURG SS PI 7.5 STRL IVOR (GLOVE) ×5 IMPLANT
GOWN STRL REUS W/ TWL XL LVL3 (GOWN DISPOSABLE) ×1 IMPLANT
GOWN STRL REUS W/TWL LRG LVL3 (GOWN DISPOSABLE) ×8 IMPLANT
GOWN STRL REUS W/TWL XL LVL3 (GOWN DISPOSABLE) ×3
HEMOSTAT SNOW SURGICEL 2X4 (HEMOSTASIS) ×3 IMPLANT
INST SET LAPROSCOPIC AP (KITS) ×3 IMPLANT
KIT ROOM TURNOVER APOR (KITS) ×3 IMPLANT
MANIFOLD NEPTUNE II (INSTRUMENTS) ×3 IMPLANT
NDL INSUFFLATION 14GA 120MM (NEEDLE) ×1 IMPLANT
NEEDLE INSUFFLATION 14GA 120MM (NEEDLE) ×3 IMPLANT
NS IRRIG 1000ML POUR BTL (IV SOLUTION) ×3 IMPLANT
PACK LAP CHOLE LZT030E (CUSTOM PROCEDURE TRAY) ×3 IMPLANT
PAD ARMBOARD 7.5X6 YLW CONV (MISCELLANEOUS) ×3 IMPLANT
POUCH SPECIMEN RETRIEVAL 10MM (ENDOMECHANICALS) ×3 IMPLANT
SET BASIN LINEN APH (SET/KITS/TRAYS/PACK) ×3 IMPLANT
SLEEVE ENDOPATH XCEL 5M (ENDOMECHANICALS) ×3 IMPLANT
SPONGE GAUZE 2X2 8PLY STER LF (GAUZE/BANDAGES/DRESSINGS) ×4
SPONGE GAUZE 2X2 8PLY STRL LF (GAUZE/BANDAGES/DRESSINGS) ×8 IMPLANT
STAPLER VISISTAT (STAPLE) ×3 IMPLANT
SUT VICRYL 0 UR6 27IN ABS (SUTURE) ×3 IMPLANT
TAPE CLOTH SURG 4X10 WHT LF (GAUZE/BANDAGES/DRESSINGS) ×2 IMPLANT
TROCAR ENDO BLADELESS 11MM (ENDOMECHANICALS) ×3 IMPLANT
TROCAR XCEL NON-BLD 5MMX100MML (ENDOMECHANICALS) ×3 IMPLANT
TROCAR XCEL UNIV SLVE 11M 100M (ENDOMECHANICALS) ×3 IMPLANT
TUBING INSUFFLATION (TUBING) ×3 IMPLANT
WARMER LAPAROSCOPE (MISCELLANEOUS) ×3 IMPLANT
YANKAUER SUCT 12FT TUBE ARGYLE (SUCTIONS) ×3 IMPLANT

## 2014-01-28 NOTE — Op Note (Signed)
Patient:  Brent Daniels  DOB:  November 11, 1985  MRN:  [id number]   Preop Diagnosis:  Chronic cholecystitis  Postop Diagnosis:  Same  Procedure:  Laparoscopic cholecystectomy  Surgeon:  Aviva Signs, M.D.  Anes:  Gen. endotracheal  Indications:  Patient is a 28 year old white male who presents with biliary colic secondary to chronic cholecystitis. The risks and benefits of the procedure including bleeding, infection, hepatobiliary injury, and the possibility of an open procedure were fully explained to the patient, who gave informed consent.  Procedure note:  The patient was placed the supine position. After induction of general endotracheal anesthesia, the abdomen was prepped and draped using usual sterile technique with ChloraPrep. Surgical site confirmation was performed.  An infraumbilical incision was made down to the fascia. A Veress needle was introduced into the abdominal cavity and confirmation of placement was done using the saline drop test. The abdomen was then insufflated 15 mmHg pressure. An 11 mm trocar was introduced into the abdominal cavity under direct visualization without difficulty. The patient was placed in reverse Trendelenburg position and an additional 11 mm trocar was placed the epigastric region and 5 mm trochars were placed the right upper quadrant and right flank regions. The liver was inspected and noted within normal limits. The gallbladder had some omental adhesions around it. These were freed away without difficulty. The gallbladder was retracted in a dynamic fashion in order to expose the triangle of Calot. The cystic duct was first identified. Its junction to the infundibulum was fully identified. Endoclips placed proximally distally on the cystic duct, and cystic duct was divided. This was likewise done cystic artery. The gallbladder was freed away from the gallbladder fossa using Bovie electrocautery. The gallbladder was delivered to the epigastric trocar site  using an Endo Catch bag. The gallbladder fossa was inspected and no abnormal bleeding or bile leakage was noted. Surgicel is placed the gallbladder fossa. All fluid and air were then evacuated from the abdominal cavity prior to removal of the trochars.  All wounds were irrigated with normal saline. All wounds were injected with 0.5% Sensorcaine. The infraumbilical fashion as well as epigastric fascia were reapproximated using 0 Vicryl interrupted sutures. All skin incisions were closed using staples. Betadine ointment and dry sterile dressings were applied.  All tape and needle counts were correct at the end of the procedure. The patient was extubated in the operating room and transferred to PACU in stable condition.  Complications:  None  EBL:  Minimal  Specimen:  Gallbladder

## 2014-01-28 NOTE — Transfer of Care (Signed)
Immediate Anesthesia Transfer of Care Note  Patient: Brent Daniels  Procedure(s) Performed: Procedure(s): LAPAROSCOPIC CHOLECYSTECTOMY (N/A)  Patient Location: PACU  Anesthesia Type:General  Level of Consciousness: awake, alert  and oriented  Airway & Oxygen Therapy: Patient Spontanous Breathing and Patient connected to face mask oxygen  Post-op Assessment: Report given to PACU RN  Post vital signs: Reviewed and stable  Complications: No apparent anesthesia complications

## 2014-01-28 NOTE — Discharge Instructions (Signed)

## 2014-01-28 NOTE — Anesthesia Preprocedure Evaluation (Addendum)
Anesthesia Evaluation  Patient identified by MRN, date of birth, ID band Patient awake    Reviewed: Allergy & Precautions, H&P , NPO status , Patient's Chart, lab work & pertinent test results  Airway Mallampati: III  TM Distance: >3 FB Neck ROM: Full    Dental  (+) Teeth Intact   Pulmonary Current Smoker,  breath sounds clear to auscultation        Cardiovascular hypertension, Pt. on medications Rhythm:Regular Rate:Normal     Neuro/Psych PSYCHIATRIC DISORDERS Anxiety    GI/Hepatic PUD, GERD-  Poorly Controlled,  Endo/Other  diabetes, Poorly Controlled, Type 1, Insulin Dependent  Renal/GU      Musculoskeletal   Abdominal   Peds  Hematology   Anesthesia Other Findings   Reproductive/Obstetrics                            Anesthesia Physical Anesthesia Plan  ASA: III  Anesthesia Plan: General   Post-op Pain Management:    Induction: Intravenous, Rapid sequence and Cricoid pressure planned  Airway Management Planned: Oral ETT  Additional Equipment:   Intra-op Plan:   Post-operative Plan: Extubation in OR  Informed Consent: I have reviewed the patients History and Physical, chart, labs and discussed the procedure including the risks, benefits and alternatives for the proposed anesthesia with the patient or authorized representative who has indicated his/her understanding and acceptance.     Plan Discussed with:   Anesthesia Plan Comments:         Anesthesia Quick Evaluation

## 2014-01-28 NOTE — Interval H&P Note (Signed)
History and Physical Interval Note:  01/28/2014 9:07 AM  Brent Daniels  has presented today for surgery, with the diagnosis of chronic cholecystitis  The various methods of treatment have been discussed with the patient and family. After consideration of risks, benefits and other options for treatment, the patient has consented to  Procedure(s): LAPAROSCOPIC CHOLECYSTECTOMY (N/A) as a surgical intervention .  The patient's history has been reviewed, patient examined, no change in status, stable for surgery.  I have reviewed the patient's chart and labs.  Questions were answered to the patient's satisfaction.     Aviva Signs A

## 2014-01-28 NOTE — Anesthesia Postprocedure Evaluation (Signed)
  Anesthesia Post-op Note  Patient: Brent Daniels  Procedure(s) Performed: Procedure(s): LAPAROSCOPIC CHOLECYSTECTOMY (N/A)  Patient Location: PACU  Anesthesia Type:General  Level of Consciousness: awake, alert  and oriented  Airway and Oxygen Therapy: Patient Spontanous Breathing and Patient connected to face mask oxygen  Post-op Pain: none  Post-op Assessment: Post-op Vital signs reviewed, Patient's Cardiovascular Status Stable, Respiratory Function Stable and Patent Airway  Post-op Vital Signs: Reviewed and stable  Last Vitals:  Filed Vitals:   01/28/14 1042  BP: 154/104  Pulse: 69  Temp: 37.1 C  Resp: 10    Complications: No apparent anesthesia complications

## 2014-01-28 NOTE — Anesthesia Procedure Notes (Signed)
Procedure Name: Intubation Performed by: Ollen Bowl Pre-anesthesia Checklist: Patient identified, Patient being monitored, Timeout performed, Emergency Drugs available and Suction available Patient Re-evaluated:Patient Re-evaluated prior to inductionOxygen Delivery Method: Circle System Utilized Preoxygenation: Pre-oxygenation with 100% oxygen Intubation Type: IV induction, Rapid sequence and Cricoid Pressure applied Ventilation: Mask ventilation without difficulty Laryngoscope Size: Mac and 3 Grade View: Grade I Tube type: Oral Tube size: 8.0 mm Number of attempts: 1 Airway Equipment and Method: stylet Placement Confirmation: ETT inserted through vocal cords under direct vision,  positive ETCO2 and breath sounds checked- equal and bilateral Secured at: 23 cm Tube secured with: Tape Dental Injury: Teeth and Oropharynx as per pre-operative assessment

## 2014-01-31 ENCOUNTER — Inpatient Hospital Stay (HOSPITAL_COMMUNITY)
Admission: EM | Admit: 2014-01-31 | Discharge: 2014-02-02 | DRG: 380 | Disposition: A | Discharge status: Home / Self Care | Payer: Managed Care, Other (non HMO) | Attending: Family Medicine | Admitting: Family Medicine

## 2014-01-31 ENCOUNTER — Encounter (HOSPITAL_COMMUNITY): Payer: Self-pay | Admitting: Emergency Medicine

## 2014-01-31 DIAGNOSIS — E10649 Type 1 diabetes mellitus with hypoglycemia without coma: Secondary | ICD-10-CM | POA: Diagnosis present

## 2014-01-31 DIAGNOSIS — E873 Alkalosis: Secondary | ICD-10-CM | POA: Diagnosis present

## 2014-01-31 DIAGNOSIS — G4700 Insomnia, unspecified: Secondary | ICD-10-CM | POA: Diagnosis present

## 2014-01-31 DIAGNOSIS — Z882 Allergy status to sulfonamides status: Secondary | ICD-10-CM

## 2014-01-31 DIAGNOSIS — E1010 Type 1 diabetes mellitus with ketoacidosis without coma: Secondary | ICD-10-CM | POA: Diagnosis present

## 2014-01-31 DIAGNOSIS — K2210 Ulcer of esophagus without bleeding: Principal | ICD-10-CM | POA: Diagnosis present

## 2014-01-31 DIAGNOSIS — Z720 Tobacco use: Secondary | ICD-10-CM

## 2014-01-31 DIAGNOSIS — Z794 Long term (current) use of insulin: Secondary | ICD-10-CM

## 2014-01-31 DIAGNOSIS — Z809 Family history of malignant neoplasm, unspecified: Secondary | ICD-10-CM

## 2014-01-31 DIAGNOSIS — G8929 Other chronic pain: Secondary | ICD-10-CM | POA: Diagnosis present

## 2014-01-31 DIAGNOSIS — Z9119 Patient's noncompliance with other medical treatment and regimen: Secondary | ICD-10-CM | POA: Diagnosis present

## 2014-01-31 DIAGNOSIS — E1040 Type 1 diabetes mellitus with diabetic neuropathy, unspecified: Secondary | ICD-10-CM | POA: Diagnosis present

## 2014-01-31 DIAGNOSIS — F419 Anxiety disorder, unspecified: Secondary | ICD-10-CM | POA: Diagnosis present

## 2014-01-31 DIAGNOSIS — I10 Essential (primary) hypertension: Secondary | ICD-10-CM | POA: Diagnosis present

## 2014-01-31 DIAGNOSIS — R109 Unspecified abdominal pain: Secondary | ICD-10-CM

## 2014-01-31 DIAGNOSIS — Z888 Allergy status to other drugs, medicaments and biological substances status: Secondary | ICD-10-CM

## 2014-01-31 DIAGNOSIS — K3184 Gastroparesis: Secondary | ICD-10-CM | POA: Diagnosis present

## 2014-01-31 DIAGNOSIS — K219 Gastro-esophageal reflux disease without esophagitis: Secondary | ICD-10-CM | POA: Diagnosis present

## 2014-01-31 DIAGNOSIS — M1990 Unspecified osteoarthritis, unspecified site: Secondary | ICD-10-CM | POA: Diagnosis present

## 2014-01-31 DIAGNOSIS — E780 Pure hypercholesterolemia: Secondary | ICD-10-CM | POA: Diagnosis present

## 2014-01-31 DIAGNOSIS — Z9049 Acquired absence of other specified parts of digestive tract: Secondary | ICD-10-CM | POA: Diagnosis present

## 2014-01-31 LAB — CBG MONITORING, ED: Glucose-Capillary: 499 mg/dL — ABNORMAL HIGH (ref 70–99)

## 2014-01-31 MED ORDER — ONDANSETRON HCL 4 MG/2ML IJ SOLN
INTRAMUSCULAR | Status: AC
  Administered 2014-01-31: 4 mg via INTRAMUSCULAR
  Filled 2014-01-31: qty 2

## 2014-01-31 NOTE — ED Notes (Signed)
Patient reports had cholecystectomy on 01/28/14. Reports abdominal and emesis since that is worsening.

## 2014-02-01 ENCOUNTER — Emergency Department (HOSPITAL_COMMUNITY): Payer: Managed Care, Other (non HMO)

## 2014-02-01 ENCOUNTER — Observation Stay (HOSPITAL_COMMUNITY): Payer: Managed Care, Other (non HMO)

## 2014-02-01 DIAGNOSIS — E873 Alkalosis: Secondary | ICD-10-CM | POA: Diagnosis present

## 2014-02-01 DIAGNOSIS — M1990 Unspecified osteoarthritis, unspecified site: Secondary | ICD-10-CM | POA: Diagnosis present

## 2014-02-01 DIAGNOSIS — R109 Unspecified abdominal pain: Secondary | ICD-10-CM

## 2014-02-01 DIAGNOSIS — E10649 Type 1 diabetes mellitus with hypoglycemia without coma: Secondary | ICD-10-CM | POA: Diagnosis present

## 2014-02-01 DIAGNOSIS — K2210 Ulcer of esophagus without bleeding: Secondary | ICD-10-CM | POA: Diagnosis present

## 2014-02-01 DIAGNOSIS — Z882 Allergy status to sulfonamides status: Secondary | ICD-10-CM | POA: Diagnosis not present

## 2014-02-01 DIAGNOSIS — I10 Essential (primary) hypertension: Secondary | ICD-10-CM | POA: Diagnosis present

## 2014-02-01 DIAGNOSIS — K219 Gastro-esophageal reflux disease without esophagitis: Secondary | ICD-10-CM | POA: Diagnosis present

## 2014-02-01 DIAGNOSIS — Z9049 Acquired absence of other specified parts of digestive tract: Secondary | ICD-10-CM | POA: Diagnosis present

## 2014-02-01 DIAGNOSIS — F419 Anxiety disorder, unspecified: Secondary | ICD-10-CM | POA: Diagnosis present

## 2014-02-01 DIAGNOSIS — E1010 Type 1 diabetes mellitus with ketoacidosis without coma: Secondary | ICD-10-CM | POA: Diagnosis present

## 2014-02-01 DIAGNOSIS — K3184 Gastroparesis: Secondary | ICD-10-CM | POA: Diagnosis present

## 2014-02-01 DIAGNOSIS — Z720 Tobacco use: Secondary | ICD-10-CM | POA: Diagnosis not present

## 2014-02-01 DIAGNOSIS — Z794 Long term (current) use of insulin: Secondary | ICD-10-CM | POA: Diagnosis not present

## 2014-02-01 DIAGNOSIS — Z888 Allergy status to other drugs, medicaments and biological substances status: Secondary | ICD-10-CM | POA: Diagnosis not present

## 2014-02-01 DIAGNOSIS — Z9119 Patient's noncompliance with other medical treatment and regimen: Secondary | ICD-10-CM | POA: Diagnosis present

## 2014-02-01 DIAGNOSIS — E780 Pure hypercholesterolemia: Secondary | ICD-10-CM | POA: Diagnosis present

## 2014-02-01 DIAGNOSIS — E1040 Type 1 diabetes mellitus with diabetic neuropathy, unspecified: Secondary | ICD-10-CM | POA: Diagnosis present

## 2014-02-01 DIAGNOSIS — Z809 Family history of malignant neoplasm, unspecified: Secondary | ICD-10-CM | POA: Diagnosis not present

## 2014-02-01 DIAGNOSIS — G4700 Insomnia, unspecified: Secondary | ICD-10-CM | POA: Diagnosis present

## 2014-02-01 DIAGNOSIS — G8929 Other chronic pain: Secondary | ICD-10-CM | POA: Diagnosis present

## 2014-02-01 LAB — BASIC METABOLIC PANEL
ANION GAP: 12 (ref 5–15)
Anion gap: 12 (ref 5–15)
BUN: 25 mg/dL — AB (ref 6–23)
BUN: 19 mg/dL (ref 6–23)
CALCIUM: 9.3 mg/dL (ref 8.4–10.5)
CHLORIDE: 87 meq/L — AB (ref 96–112)
CO2: 38 mmol/L — ABNORMAL HIGH (ref 19–32)
CO2: 34 mmol/L — ABNORMAL HIGH (ref 19–32)
CREATININE: 0.89 mg/dL (ref 0.50–1.35)
Calcium: 8.8 mg/dL (ref 8.4–10.5)
Chloride: 85 mEq/L — ABNORMAL LOW (ref 96–112)
Creatinine, Ser: 0.98 mg/dL (ref 0.50–1.35)
GFR calc Af Amer: >90 mL/min (ref >90)
GFR calc non Af Amer: >90 mL/min (ref >90)
Glucose, Bld: 201 mg/dL — ABNORMAL HIGH (ref 70–99)
Glucose, Bld: 196 mg/dL — ABNORMAL HIGH (ref 70–99)
POTASSIUM: 3.3 mmol/L — AB (ref 3.5–5.1)
Potassium: 3 mmol/L — ABNORMAL LOW (ref 3.5–5.1)
Sodium: 135 mmol/L (ref 135–145)
Sodium: 133 mmol/L — ABNORMAL LOW (ref 135–145)

## 2014-02-01 LAB — BLOOD GAS, ARTERIAL
ACID-BASE EXCESS: 12.4 mmol/L — AB (ref 0.0–2.0)
BICARBONATE: 37.2 meq/L — AB (ref 20.0–24.0)
Drawn by: 22223
FIO2: 21 %
O2 Saturation: 95.5 %
PCO2 ART: 54.8 mmHg — AB (ref 35.0–45.0)
Patient temperature: 37
TCO2: 33.2 mmol/L (ref 0–100)
pH, Arterial: 7.447 (ref 7.350–7.450)
pO2, Arterial: 76 mmHg — ABNORMAL LOW (ref 80.0–100.0)

## 2014-02-01 LAB — GLUCOSE, CAPILLARY
GLUCOSE-CAPILLARY: 244 mg/dL — AB (ref 70–99)
GLUCOSE-CAPILLARY: 131 mg/dL — AB (ref 70–99)
GLUCOSE-CAPILLARY: 277 mg/dL — AB (ref 70–99)
GLUCOSE-CAPILLARY: 218 mg/dL — AB (ref 70–99)
Glucose-Capillary: 195 mg/dL — ABNORMAL HIGH (ref 70–99)
Glucose-Capillary: 209 mg/dL — ABNORMAL HIGH (ref 70–99)
Glucose-Capillary: 218 mg/dL — ABNORMAL HIGH (ref 70–99)
Glucose-Capillary: 102 mg/dL — ABNORMAL HIGH (ref 70–99)

## 2014-02-01 LAB — COMPREHENSIVE METABOLIC PANEL WITH GFR
ALT: 44 U/L (ref 0–53)
AST: 23 U/L (ref 0–37)
Albumin: 4.4 g/dL (ref 3.5–5.2)
Alkaline Phosphatase: 145 U/L — ABNORMAL HIGH (ref 39–117)
Anion gap: 20 — ABNORMAL HIGH (ref 5–15)
BUN: 33 mg/dL — ABNORMAL HIGH (ref 6–23)
CO2: 37 mmol/L — ABNORMAL HIGH (ref 19–32)
Calcium: 10.2 mg/dL (ref 8.4–10.5)
Chloride: 69 meq/L — ABNORMAL LOW (ref 96–112)
Creatinine, Ser: 1.33 mg/dL (ref 0.50–1.35)
GFR calc Af Amer: 82 mL/min — ABNORMAL LOW
GFR calc non Af Amer: 71 mL/min — ABNORMAL LOW
Glucose, Bld: 446 mg/dL — ABNORMAL HIGH (ref 70–99)
Potassium: 3.7 mmol/L (ref 3.5–5.1)
Sodium: 126 mmol/L — ABNORMAL LOW (ref 135–145)
Total Bilirubin: 1.5 mg/dL — ABNORMAL HIGH (ref 0.3–1.2)
Total Protein: 8.9 g/dL — ABNORMAL HIGH (ref 6.0–8.3)

## 2014-02-01 LAB — CBC WITH DIFFERENTIAL/PLATELET
Basophils Absolute: 0 10*3/uL (ref 0.0–0.1)
Basophils Relative: 0 % (ref 0–1)
EOS PCT: 0 % (ref 0–5)
Eosinophils Absolute: 0 10*3/uL (ref 0.0–0.7)
HCT: 39.7 % (ref 39.0–52.0)
Hemoglobin: 13.4 g/dL (ref 13.0–17.0)
LYMPHS ABS: 1.6 10*3/uL (ref 0.7–4.0)
Lymphocytes Relative: 8 % — ABNORMAL LOW (ref 12–46)
MCH: 30.2 pg (ref 26.0–34.0)
MCHC: 33.8 g/dL (ref 30.0–36.0)
MCV: 89.6 fL (ref 78.0–100.0)
Monocytes Absolute: 1.2 10*3/uL — ABNORMAL HIGH (ref 0.1–1.0)
Monocytes Relative: 6 % (ref 3–12)
Neutro Abs: 17.8 10*3/uL — ABNORMAL HIGH (ref 1.7–7.7)
Neutrophils Relative %: 86 % — ABNORMAL HIGH (ref 43–77)
Platelets: 473 10*3/uL — ABNORMAL HIGH (ref 150–400)
RBC: 4.43 MIL/uL (ref 4.22–5.81)
RDW: 13.7 % (ref 11.5–15.5)
WBC: 20.7 10*3/uL — ABNORMAL HIGH (ref 4.0–10.5)

## 2014-02-01 LAB — CBC
HEMATOCRIT: 37.2 % — AB (ref 39.0–52.0)
Hemoglobin: 12.2 g/dL — ABNORMAL LOW (ref 13.0–17.0)
MCH: 29.7 pg (ref 26.0–34.0)
MCHC: 32.8 g/dL (ref 30.0–36.0)
MCV: 90.5 fL (ref 78.0–100.0)
PLATELETS: 422 10*3/uL — AB (ref 150–400)
RBC: 4.11 MIL/uL — ABNORMAL LOW (ref 4.22–5.81)
RDW: 13.7 % (ref 11.5–15.5)
WBC: 17.7 10*3/uL — ABNORMAL HIGH (ref 4.0–10.5)

## 2014-02-01 LAB — URINALYSIS, ROUTINE W REFLEX MICROSCOPIC
Glucose, UA: >1000 mg/dL — AB
Hgb urine dipstick: NEGATIVE
KETONES UR: 40 mg/dL — AB
Leukocytes, UA: NEGATIVE
NITRITE: NEGATIVE
PH: 7 (ref 5.0–8.0)
PROTEIN: NEGATIVE mg/dL
Specific Gravity, Urine: <1.005 — ABNORMAL LOW (ref 1.005–1.030)
Urobilinogen, UA: 0.2 mg/dL (ref 0.0–1.0)

## 2014-02-01 LAB — CREATININE, SERUM
Creatinine, Ser: 1.17 mg/dL (ref 0.50–1.35)
GFR calc Af Amer: >90 mL/min (ref >90)
GFR calc non Af Amer: 82 mL/min — ABNORMAL LOW (ref >90)

## 2014-02-01 LAB — CBG MONITORING, ED
Glucose-Capillary: 379 mg/dL — ABNORMAL HIGH (ref 70–99)
Glucose-Capillary: 385 mg/dL — ABNORMAL HIGH (ref 70–99)
Glucose-Capillary: 435 mg/dL — ABNORMAL HIGH (ref 70–99)
Glucose-Capillary: 360 mg/dL — ABNORMAL HIGH (ref 70–99)

## 2014-02-01 LAB — URINE MICROSCOPIC-ADD ON

## 2014-02-01 LAB — LACTIC ACID, PLASMA: Lactic Acid, Venous: 1.4 mmol/L (ref 0.5–2.2)

## 2014-02-01 LAB — MRSA PCR SCREENING: MRSA BY PCR: NEGATIVE

## 2014-02-01 LAB — LIPASE, BLOOD: Lipase: 17 U/L (ref 11–59)

## 2014-02-01 MED ORDER — SODIUM CHLORIDE 0.9 % IV SOLN
INTRAVENOUS | Status: DC
  Administered 2014-02-01: 07:00:00 via INTRAVENOUS
  Filled 2014-02-01: qty 2.5

## 2014-02-01 MED ORDER — HYDROMORPHONE HCL 1 MG/ML IJ SOLN
INTRAMUSCULAR | Status: AC
  Filled 2014-02-01: qty 1

## 2014-02-01 MED ORDER — PANTOPRAZOLE SODIUM 40 MG IV SOLR
40.0000 mg | Freq: Two times a day (BID) | INTRAVENOUS | Status: DC
  Administered 2014-02-01 – 2014-02-02 (×3): 40 mg via INTRAVENOUS
  Filled 2014-02-01 (×3): qty 40

## 2014-02-01 MED ORDER — HYDROMORPHONE HCL 1 MG/ML IJ SOLN
1.0000 mg | INTRAMUSCULAR | Status: DC | PRN
  Administered 2014-02-01 – 2014-02-02 (×7): 1 mg via INTRAVENOUS
  Filled 2014-02-01 (×7): qty 1

## 2014-02-01 MED ORDER — HYDROMORPHONE HCL 1 MG/ML IJ SOLN
1.0000 mg | Freq: Once | INTRAMUSCULAR | Status: AC
  Administered 2014-02-01: 1 mg via INTRAVENOUS

## 2014-02-01 MED ORDER — INSULIN REGULAR BOLUS VIA INFUSION
0.0000 [IU] | Freq: Three times a day (TID) | INTRAVENOUS | Status: DC
  Filled 2014-02-01: qty 10

## 2014-02-01 MED ORDER — OXYCODONE-ACETAMINOPHEN 10-325 MG PO TABS: 1.0000 | ORAL_TABLET | ORAL | Status: DC | PRN

## 2014-02-01 MED ORDER — DEXTROSE 50 % IV SOLN
INTRAVENOUS | Status: AC
  Filled 2014-02-01: qty 50

## 2014-02-01 MED ORDER — ONDANSETRON HCL 4 MG/2ML IJ SOLN
4.0000 mg | Freq: Once | INTRAMUSCULAR | Status: AC
  Administered 2014-01-31: 4 mg via INTRAMUSCULAR

## 2014-02-01 MED ORDER — IOHEXOL 300 MG/ML  SOLN
100.0000 mL | Freq: Once | INTRAMUSCULAR | Status: AC | PRN
  Administered 2014-02-01: 100 mL via INTRAVENOUS

## 2014-02-01 MED ORDER — ALUM & MAG HYDROXIDE-SIMETH 200-200-20 MG/5ML PO SUSP
15.0000 mL | Freq: Four times a day (QID) | ORAL | Status: DC | PRN
  Administered 2014-02-01: 15 mL via ORAL
  Filled 2014-02-01: qty 30

## 2014-02-01 MED ORDER — ONDANSETRON HCL 4 MG PO TABS
4.0000 mg | ORAL_TABLET | Freq: Three times a day (TID) | ORAL | Status: DC
Start: 2014-02-01 — End: 2014-02-02
  Administered 2014-02-01 – 2014-02-02 (×5): 4 mg via ORAL
  Filled 2014-02-01 (×5): qty 1

## 2014-02-01 MED ORDER — SUCRALFATE 1 GM/10ML PO SUSP
1.0000 g | Freq: Three times a day (TID) | ORAL | Status: DC
  Administered 2014-02-01 – 2014-02-02 (×6): 1 g via ORAL
  Filled 2014-02-01 (×6): qty 10

## 2014-02-01 MED ORDER — SODIUM CHLORIDE 0.9 % IV SOLN
1000.0000 mL | Freq: Once | INTRAVENOUS | Status: AC
  Administered 2014-02-01: 1000 mL via INTRAVENOUS

## 2014-02-01 MED ORDER — SODIUM CHLORIDE 0.9 % IV BOLUS (SEPSIS): 1000.0000 mL | Freq: Once | INTRAVENOUS | Status: DC

## 2014-02-01 MED ORDER — METOCLOPRAMIDE HCL 10 MG PO TABS
10.0000 mg | ORAL_TABLET | Freq: Three times a day (TID) | ORAL | Status: DC
  Administered 2014-02-01 – 2014-02-02 (×6): 10 mg via ORAL
  Filled 2014-02-01 (×6): qty 1

## 2014-02-01 MED ORDER — SODIUM CHLORIDE 0.9 % IV SOLN: 1000.0000 mL | INTRAVENOUS | Status: DC

## 2014-02-01 MED ORDER — DEXTROSE-NACL 5-0.45 % IV SOLN
INTRAVENOUS | Status: DC
  Administered 2014-02-01: 06:00:00 via INTRAVENOUS

## 2014-02-01 MED ORDER — OXYCODONE-ACETAMINOPHEN 5-325 MG PO TABS
1.0000 | ORAL_TABLET | ORAL | Status: DC | PRN
  Administered 2014-02-01: 1 via ORAL
  Filled 2014-02-01: qty 1

## 2014-02-01 MED ORDER — GABAPENTIN 400 MG PO CAPS
800.0000 mg | ORAL_CAPSULE | Freq: Three times a day (TID) | ORAL | Status: DC
  Administered 2014-02-01 – 2014-02-02 (×5): 800 mg via ORAL
  Filled 2014-02-01 (×5): qty 2

## 2014-02-01 MED ORDER — SODIUM CHLORIDE 0.9 % IV SOLN
INTRAVENOUS | Status: DC
  Administered 2014-02-01 – 2014-02-02 (×3): via INTRAVENOUS

## 2014-02-01 MED ORDER — HEPARIN SODIUM (PORCINE) 5000 UNIT/ML IJ SOLN
5000.0000 [IU] | Freq: Three times a day (TID) | INTRAMUSCULAR | Status: DC
  Administered 2014-02-01 – 2014-02-02 (×4): 5000 [IU] via SUBCUTANEOUS
  Filled 2014-02-01 (×5): qty 1

## 2014-02-01 MED ORDER — PROMETHAZINE HCL 12.5 MG PO TABS
25.0000 mg | ORAL_TABLET | Freq: Four times a day (QID) | ORAL | Status: DC | PRN
  Administered 2014-02-01 (×3): 25 mg via ORAL
  Filled 2014-02-01 (×3): qty 2

## 2014-02-01 MED ORDER — TIZANIDINE HCL 4 MG PO TABS
4.0000 mg | ORAL_TABLET | Freq: Four times a day (QID) | ORAL | Status: DC | PRN
  Filled 2014-02-01: qty 1

## 2014-02-01 MED ORDER — PROMETHAZINE HCL 25 MG/ML IJ SOLN
INTRAMUSCULAR | Status: AC
  Administered 2014-02-01: 12.5 mg
  Filled 2014-02-01: qty 1

## 2014-02-01 MED ORDER — NICOTINE 14 MG/24HR TD PT24
14.0000 mg | MEDICATED_PATCH | Freq: Every day | TRANSDERMAL | Status: DC
  Administered 2014-02-01 – 2014-02-02 (×2): 14 mg via TRANSDERMAL
  Filled 2014-02-01 (×2): qty 1

## 2014-02-01 MED ORDER — SODIUM CHLORIDE 0.9 % IV SOLN
INTRAVENOUS | Status: DC
  Administered 2014-02-01: 3.2 [IU]/h via INTRAVENOUS
  Filled 2014-02-01: qty 2.5

## 2014-02-01 MED ORDER — INSULIN DETEMIR 100 UNIT/ML ~~LOC~~ SOLN
7.0000 [IU] | SUBCUTANEOUS | Status: DC
  Administered 2014-02-01: 7 [IU] via SUBCUTANEOUS
  Filled 2014-02-01 (×3): qty 0.07

## 2014-02-01 MED ORDER — HYDROMORPHONE HCL 1 MG/ML IJ SOLN
1.0000 mg | INTRAMUSCULAR | Status: DC | PRN
  Administered 2014-02-01: 1 mg via INTRAVENOUS
  Filled 2014-02-01: qty 1

## 2014-02-01 MED ORDER — HYDROMORPHONE HCL 1 MG/ML IJ SOLN
1.0000 mg | Freq: Once | INTRAMUSCULAR | Status: AC
  Administered 2014-02-01: 1 mg via INTRAVENOUS
  Filled 2014-02-01: qty 1

## 2014-02-01 MED ORDER — ONDANSETRON HCL 4 MG/2ML IJ SOLN
4.0000 mg | Freq: Once | INTRAMUSCULAR | Status: AC
Start: 2014-02-01 — End: 2014-02-01
  Administered 2014-02-01: 4 mg via INTRAVENOUS
  Filled 2014-02-01: qty 2

## 2014-02-01 MED ORDER — HYDROMORPHONE HCL 1 MG/ML IJ SOLN
INTRAMUSCULAR | Status: AC
Start: 2014-02-01 — End: 2014-02-01
  Filled 2014-02-01: qty 1

## 2014-02-01 MED ORDER — DEXTROSE-NACL 5-0.45 % IV SOLN: INTRAVENOUS | Status: DC

## 2014-02-01 MED ORDER — OXYCODONE HCL 5 MG PO TABS
5.0000 mg | ORAL_TABLET | ORAL | Status: DC | PRN
  Administered 2014-02-01: 5 mg via ORAL
  Filled 2014-02-01: qty 1

## 2014-02-01 MED ORDER — SODIUM CHLORIDE 0.9 % IV SOLN
INTRAVENOUS | Status: DC
  Administered 2014-02-01: 10:00:00 via INTRAVENOUS

## 2014-02-01 MED ORDER — INSULIN ASPART 100 UNIT/ML ~~LOC~~ SOLN
3.0000 [IU] | Freq: Three times a day (TID) | SUBCUTANEOUS | Status: DC
  Administered 2014-02-02 (×2): 3 [IU] via SUBCUTANEOUS

## 2014-02-01 MED ORDER — IOHEXOL 300 MG/ML  SOLN
50.0000 mL | Freq: Once | INTRAMUSCULAR | Status: AC | PRN
  Administered 2014-02-01: 50 mL via ORAL

## 2014-02-01 MED ORDER — HYDROMORPHONE HCL 1 MG/ML IJ SOLN
0.5000 mg | INTRAMUSCULAR | Status: DC | PRN
  Administered 2014-02-01: 1 mg via INTRAVENOUS
  Filled 2014-02-01: qty 1

## 2014-02-01 MED ORDER — ONDANSETRON HCL 4 MG/2ML IJ SOLN
4.0000 mg | Freq: Once | INTRAMUSCULAR | Status: AC
  Administered 2014-02-01: 4 mg via INTRAVENOUS
  Filled 2014-02-01: qty 2

## 2014-02-01 MED ORDER — INSULIN ASPART 100 UNIT/ML ~~LOC~~ SOLN
0.0000 [IU] | Freq: Three times a day (TID) | SUBCUTANEOUS | Status: DC
  Administered 2014-02-01: 5 [IU] via SUBCUTANEOUS
  Administered 2014-02-02: 3 [IU] via SUBCUTANEOUS
  Administered 2014-02-02: 9 [IU] via SUBCUTANEOUS

## 2014-02-01 MED ORDER — DEXTROSE 50 % IV SOLN: 25.0000 mL | INTRAVENOUS | Status: DC | PRN

## 2014-02-01 MED ORDER — PRAVASTATIN SODIUM 20 MG PO TABS
20.0000 mg | ORAL_TABLET | Freq: Every day | ORAL | Status: DC
  Filled 2014-02-01: qty 1

## 2014-02-01 NOTE — Progress Notes (Addendum)
7:58 AM I agree with HPI/GPe and A/P per Dr. Ernestina Patches  28 y/o ? known h/o severe Esophagitis on Endo11/17/15 followed by Dr. [name], recent uncomplicatedGB surgery Dr. Arnoldo Morale 01/28/14 admitted with DKA-AG ion admit=20, Sodum 126, Cl 69.  Found to have other mixed metabolic anomalies, thought 2/2 to n/v + DKA Patient admitted ot ICU, placed on Insulin Stabilizer  Has continued to have nausea vomiting and feels queasy in his stomach. He states however he is hungry in the same sentence. Has not eaten properly meal in the past 2-3 days States he's had some weight loss since all of his reflux issues started. Requesting IV Dilaudid for pain control as nothing by mouth and post-op 4 days out       HEENT alert but disheveled Caucasian male no apparent distress CHEST clinically clear no added sound CARDIAC S1-S2 no murmur rub or gallop ABDOMEN soft nontender surgical scars noted in abdomen which appear clean NEURO grossly intact SKIN/MUSCULAR see above  Patient Active Problem List   Diagnosis Date Noted  . Esophagitis 01/03/2014  . Nausea with vomiting   . Acute esophagitis   . Hyperglycemia 12/20/2013  . Abdominal pain 12/09/2013  . PUD (peptic ulcer disease) 10/29/2013  . Leukocytosis 09/29/2013  . Sinus tachycardia 09/29/2013  . Intractable nausea and vomiting 09/28/2013  . Chronic generalized abdominal pain 09/28/2013  . Diabetic gastroparesis associated with type 1 diabetes mellitus 09/28/2013  . Hematemesis 09/28/2013  . Diabetic neuropathy 09/28/2013  . Abdominal pain, epigastric 08/19/2013  . Uncontrolled diabetes mellitus 06/15/2013  . GERD (gastroesophageal reflux disease) 06/15/2013  . DKA, type 1 06/15/2013  . Essential hypertension 07/11/2012  . DM (diabetes mellitus) 07/11/2012  . Scoliosis 07/11/2012  . Back pain 07/11/2012  . S/P arthroscopic knee surgery 07/04/2011   Abdominal pain secondary to multiple factors-mild DKA =/- diabetic gastroparesis versus erosive  esophagitis on CT scan-gastroenterology Dr. to to see the patient in consult-last EGD 12/2013 showed severe erosive esophagitis. Patient has appropriately been placed on IV Protonix 40 twice a day and Carafate 1 g 3 times a day with meals and bedtime. Type 1 diabetes mellitus diagnosed at age 53-H and does not have an endocrinologist. He needs regular screening for neuropathy, op, they as well as nephropathy-I'm not sure how compliant he is and we will inquire into this.  He should be able to transition to basal bolus insulin from IV insulin stabilizer today. History of noncompliance-patient has left against medical device on numerous occasions secondary to his repeated requests for IV Dilaudid. He has outpatient scheduled follow-up with primary care physician in [facility] as well as his gastroenterologist--patient voices to me himself that "I might have chronic pain" and I think it is an excellent idea to get a chronic pain physician to help him with his multiple other issues and needs Tobacco abuse-I will place the patient on 14 g patch Sinus tachycardia-probably related to volume depletion from vomiting, DKA-bolus IV fluids 1 L now and then continue IV fluids at 150 cc per hour  Rest as per history of present illness dictated earlier this morning  Verneita Griffes, MD [facility] [phone]

## 2014-02-01 NOTE — H&P (Signed)
Hospitalist Admission History and Physical  Patient name: Brent Daniels Medical record number: [id number] Date of birth: 27-Mar-1985 Age: 28 y.o. Gender: male  Primary Care Provider: [name], MD  Chief Complaint: abd pain, nausea and vomiting, leukocytosis, hyperglycemia   History of Present Illness:This is a 28 y.o. year old male with significant past medical history of chronic abdominal pain, intractable nausea and vomiting,  esophagitis, chronic cholecystitis s/p cholecystectomy, IDDM  presenting with abd pain, nausea, vomiting, leukocytosis, hyperglycemia. Patient noted to have an elective cholecystectomy 4 days ago. Reports severe generalized abdominal pain, nausea and vomiting over past 1-2 days. Subjective fevers and chills at home. This is a recurrent issue. States the intensity is much worsened previous. No change morphology. Denies any alcohol use. Presents to the ER temperature 98.4, heart rate in the 110s to 130s, blood pressure in the 130s to 170s over 90s to 100s, satting 97% on room air. Ambulating with minimal observable pain. White blood cell count 20.7, sodium 126, bicarbonate 37, creatinine 1.33, glucose 446. ALP 145. Lipase 17. Lactate 1.4. CT of abdomen shows no acute abnormalities. Small amount fluid around the gallbladder fossa status post cholecystectomy with no evidence of bile leak. Small amount of free air tracking consistent with recent cholecystectomy. Diffuse thickening of the distal esophagus. Started on insulin drip.  Assessment and Plan: Brent Daniels is a 28 y.o. year old male presenting with abd pain, nausea and vomiting, leukocytosis, hyperglycemia Active Problems:   Abdominal pain   1- Abd Pain/Nausea and Vomiting  - Acute on chronic issue -Status post cholecystectomy 4 days ago -No acute findings on imaging concerning for biliary leak -Pain control -Continue anti-emetic and gastroparesis regimen -Suspect element of recurrent esophagitis -IV  protonix gtt -Carafate -GI consult as clinically indicated -Touch base with surgery in the morning   2-leukocytosis -Elevated white blood cell count in the postoperative state -Afebrile -Urinalysis pending -Check chest x-ray -Defer antibiotics for now -Continue to follow closely -Follow imaging and lab work  3-hyperglycemia -No metabolic acidosis or anion gap -Continue insulin drip -Transition to home regimen as clinically indicated  4-metabolic alkalosis -Likely secondary to recurrent nausea and vomiting -hydrate  -follow    FEN/GI: NPO  Prophylaxis: sub q heparin  Disposition: pending further evaluation  Code Status:Full Code    Patient Active Problem List   Diagnosis Date Noted  . Esophagitis 01/03/2014  . Nausea with vomiting   . Acute esophagitis   . Hyperglycemia 12/20/2013  . Abdominal pain 12/09/2013  . PUD (peptic ulcer disease) 10/29/2013  . Leukocytosis 09/29/2013  . Sinus tachycardia 09/29/2013  . Intractable nausea and vomiting 09/28/2013  . Chronic generalized abdominal pain 09/28/2013  . Diabetic gastroparesis associated with type 1 diabetes mellitus 09/28/2013  . Hematemesis 09/28/2013  . Diabetic neuropathy 09/28/2013  . Abdominal pain, epigastric 08/19/2013  . Uncontrolled diabetes mellitus 06/15/2013  . GERD (gastroesophageal reflux disease) 06/15/2013  . DKA, type 1 06/15/2013  . Essential hypertension 07/11/2012  . DM (diabetes mellitus) 07/11/2012  . Scoliosis 07/11/2012  . Back pain 07/11/2012  . S/P arthroscopic knee surgery 07/04/2011   Past Medical History: Past Medical History  Diagnosis Date  . Diabetes mellitus     type 1  . Arthritis   . GERD (gastroesophageal reflux disease)   . PUD (peptic ulcer disease)   . Hypertension   . Heart murmur     valvular stenosis  . Neuropathy   . Diabetic gastroparesis associated with type 1 diabetes mellitus   .  S/P arthroscopic knee surgery 07/04/2011  . Scoliosis 07/11/2012  .  Chronic back pain   . Chronic abdominal pain   . Nausea and vomiting     chronic, recurrent  . Acute esophagitis   . Gastroparesis   . Erosive esophagitis 12/23/2013    "severe" per EGD   . Insomnia   . Anxiety   . Hypercholesteremia     Past Surgical History: Past Surgical History  Procedure Laterality Date  . Tympanostomy tube placement    . Knee arthroscopy  06/30/2011    Procedure: ARTHROSCOPY KNEE;  Surgeon: Carole Civil, MD;  Location: [facility];  Service: Orthopedics;  Laterality: Right;  diagnostic arthroscopy  . Esophagogastroduodenoscopy (egd) with propofol  06/17/2013    Dr. Oneida Alar: two gastric ulcers in fundus, mild antral gastritis, candida esophagitis  . Esophageal biopsy  06/17/2013    Procedure: GASTRIC ULCER AND ANTRAL BIOPSIES; ESOPHAGEAL BRUSHING;  Surgeon: Danie Binder, MD;  Location: [facility];  Service: Endoscopy;;  . Esophagogastroduodenoscopy (egd) with propofol N/A 09/11/2013    Dr. [name]: abnormal hypopharynx, question massively enlarged tonsils, stomach full of food precluded exam, needs EGD for verification of ulcer healing at a later date  . Esophagogastroduodenoscopy (egd) with propofol N/A 12/23/2013    Procedure: ESOPHAGOGASTRODUODENOSCOPY (EGD) WITH PROPOFOL;  Surgeon: Daneil Dolin, MD;  Location: [facility];  Service: Endoscopy;  Laterality: N/A;  . Esophageal biopsy N/A 12/23/2013    Procedure: ESOPHAGEAL BIOPSY;  Surgeon: Daneil Dolin, MD;  Location: [facility];  Service: Endoscopy;  Laterality: N/A;    Social History: History   Social History  . Marital Status: Single    Spouse Name: N/A    Number of Children: N/A  . Years of Education: 12   Occupational History  . unemployed     trying to get disability   Social History Main Topics  . Smoking status: Current Some Day Smoker -- 0.25 packs/day for 10 years    Types: Cigarettes  . Smokeless tobacco: Former Systems developer    Quit date: 02/17/2013     Comment: smokes 4 cigarettes a day   . Alcohol  Use: No  . Drug Use: No  . Sexual Activity: None   Other Topics Concern  . None   Social History Narrative    Family History: Family History  Problem Relation Age of Onset  . Cancer Father   . Alcohol abuse Father   . Pseudochol deficiency Neg Hx   . Malignant hyperthermia Neg Hx   . Hypotension Neg Hx   . Anesthesia problems Neg Hx   . Colon cancer Neg Hx     Allergies: Allergies  Allergen Reactions  . Hydrocodone Hives  . Tramadol Anaphylaxis and Other (See Comments)    Acid Reflux  . Ibuprofen Other (See Comments)    Stomach ulcers  . Naproxen Other (See Comments)    Stomach ulcers  . Sulfa Antibiotics Other (See Comments)    Stomach ulcers  . Morphine And Related Rash    Current Facility-Administered Medications  Medication Dose Route Frequency Provider Last Rate Last Dose  . 0.9 %  sodium chloride infusion  1,000 mL Intravenous Continuous Evalee Jefferson, PA-C      . 0.9 %  sodium chloride infusion   Intravenous Continuous Shanda Howells, MD      . dextrose 5 %-0.45 % sodium chloride infusion   Intravenous Continuous Almyra Free Idol, PA-C      . dextrose 5 %-0.45 % sodium chloride infusion  Intravenous Continuous Shanda Howells, MD      . dextrose 50 % solution 25 mL  25 mL Intravenous PRN Shanda Howells, MD      . heparin injection 5,000 Units  5,000 Units Subcutaneous 3 times per day Shanda Howells, MD      . HYDROmorphone (DILAUDID) injection 0.5-1 mg  0.5-1 mg Intravenous Q3H PRN Shanda Howells, MD      . insulin regular (NOVOLIN R,HUMULIN R) 250 Units in sodium chloride 0.9 % 250 mL (1 Units/mL) infusion   Intravenous Continuous Shanda Howells, MD      . insulin regular bolus via infusion 0-10 Units  0-10 Units Intravenous TID WC Shanda Howells, MD       Current Outpatient Prescriptions  Medication Sig Dispense Refill  . gabapentin (NEURONTIN) 800 MG tablet Take 800 mg by mouth 3 (three) times daily.    . insulin NPH-regular Human (NOVOLIN 70/30) (70-30) 100 UNIT/ML  injection Inject 20 Units into the skin 2 (two) times daily with a meal. 15 units in the morning and 25 at night. (Patient taking differently: Inject 15-25 Units into the skin 2 (two) times daily with a meal. 15 units in the morning and 25 at night.) 10 mL 11  . insulin regular (NOVOLIN R,HUMULIN R) 100 units/mL injection Inject 2-10 Units into the skin 3 (three) times daily before meals. Sliding scale.    Marland Kitchen lisinopril (PRINIVIL,ZESTRIL) 20 MG tablet Take 20 mg by mouth daily.    . metoCLOPramide (REGLAN) 10 MG tablet Take 10 mg by mouth 4 (four) times daily -  before meals and at bedtime.    . ondansetron (ZOFRAN) 4 MG tablet Take 1 tablet (4 mg total) by mouth 3 (three) times daily with meals. (Patient taking differently: Take 4 mg by mouth every 8 (eight) hours as needed for nausea or vomiting. ) 90 tablet 5  . oxyCODONE-acetaminophen (PERCOCET) 10-325 MG per tablet Take 1 tablet by mouth every 4 (four) hours as needed for pain. 50 tablet 0  . pantoprazole (PROTONIX) 40 MG tablet Take 1 tablet (40 mg total) by mouth 2 (two) times daily before a meal. 60 tablet 2  . pravastatin (PRAVACHOL) 20 MG tablet Take 20 mg by mouth daily.     . promethazine (PHENERGAN) 25 MG tablet Take 1 tablet (25 mg total) by mouth every 6 (six) hours as needed for nausea or vomiting. 40 tablet 1  . tiZANidine (ZANAFLEX) 4 MG tablet Take 1 tablet by mouth every 6 (six) hours as needed for muscle spasms.      Review Of Systems: 12 point ROS negative except as noted above in HPI.  Physical Exam: Filed Vitals:   02/01/14 0200  BP: 164/102  Pulse: 119  Temp:   Resp:     General: alert, cooperative and NAD  HEENT: PERRLA and extra ocular movement intact Heart: S1, S2 normal, no murmur, rub or gallop, regular rate and rhythm Lungs: clear to auscultation, no wheezes or rales and unlabored breathing Abdomen: post surgical changes on abd-healing appropriately. Mild abd tenderness, diffusely. + bowel sounds Extremities:  extremities normal, atraumatic, no cyanosis or edema Skin:no rashes Neurology: normal without focal findings  Labs and Imaging: Lab Results  Component Value Date/Time   NA 126* 02/01/2014 12:11 AM   K 3.7 02/01/2014 12:11 AM   CL 69* 02/01/2014 12:11 AM   CO2 37* 02/01/2014 12:11 AM   BUN 33* 02/01/2014 12:11 AM   CREATININE 1.33 02/01/2014 12:11 AM   GLUCOSE 446*  02/01/2014 12:11 AM   Lab Results  Component Value Date   WBC 20.7* 02/01/2014   HGB 13.4 02/01/2014   HCT 39.7 02/01/2014   MCV 89.6 02/01/2014   PLT 473* 02/01/2014   Urinalysis    Component Value Date/Time   COLORURINE YELLOW 02/01/2014 0150   APPEARANCEUR CLEAR 02/01/2014 0150   LABSPEC <1.005* 02/01/2014 0150   PHURINE 7.0 02/01/2014 0150   GLUCOSEU >1000* 02/01/2014 0150   HGBUR NEGATIVE 02/01/2014 0150   BILIRUBINUR SMALL* 02/01/2014 0150   KETONESUR 40* 02/01/2014 0150   PROTEINUR NEGATIVE 02/01/2014 0150   UROBILINOGEN 0.2 02/01/2014 0150   NITRITE NEGATIVE 02/01/2014 0150   LEUKOCYTESUR NEGATIVE 02/01/2014 0150       Ct Abdomen Pelvis W Contrast  02/01/2014   CLINICAL DATA:  Acute onset of generalized abdominal pain and vomiting. Status post cholecystectomy 4 days ago. Initial encounter.  EXAM: CT ABDOMEN AND PELVIS WITH CONTRAST  TECHNIQUE: Multidetector CT imaging of the abdomen and pelvis was performed using the standard protocol following bolus administration of intravenous contrast.  CONTRAST:  81mL OMNIPAQUE IOHEXOL 300 MG/ML SOLN, 175mL OMNIPAQUE IOHEXOL 300 MG/ML SOLN  COMPARISON:  CT of the abdomen and pelvis from 11/29/2013, and abdominal ultrasound performed 12/20/2013  FINDINGS: The visualized lung bases are clear. Diffuse thickening is noted at the distal esophagus. Would correlate to exclude esophagitis, and correlate with prior esophageal biopsy results.  Free air is noted tracking about the upper abdomen. This is thought to remain within normal limits 4 days status post cholecystectomy.   A small amount of fluid and air is noted tracking along the gallbladder fossa. This likely remains within normal limits given recent cholecystectomy. No free fluid is seen tracking into the pelvis to suggest bile leak. Scattered clips are seen in the gallbladder fossa.  The liver and spleen are unremarkable in appearance. The pancreas and adrenal glands are unremarkable.  The kidneys are unremarkable in appearance. There is no evidence of hydronephrosis. No renal or ureteral stones are seen. No perinephric stranding is appreciated.  No free fluid is identified. The small bowel is unremarkable in appearance. The stomach is within normal limits. No acute vascular abnormalities are seen.  The appendix is not definitely seen; there is no evidence of appendicitis. The colon is partially filled with stool and is unremarkable in appearance.  The bladder is moderately distended and grossly unremarkable. The prostate remains normal in size. No inguinal lymphadenopathy is seen.  No acute osseous abnormalities are identified.  IMPRESSION: 1. No acute abnormality seen to explain the patient's symptoms. 2. Small amount of fluid and air at the gallbladder fossa likely remains within normal limits given recent cholecystectomy. No evidence of bile leak. 3. Free air tracking about the upper abdomen is thought to remain within normal limits, 4 days status post cholecystectomy. 4. Diffuse thickening at the distal esophagus. Would correlate to exclude esophagitis, and correlate with prior esophageal biopsy results.   Electronically Signed   By: Garald Balding M.D.   On: 02/01/2014 02:49           Shanda Howells MD  Pager: [phone]

## 2014-02-01 NOTE — Progress Notes (Signed)
Aware of admission.  Nothing to add from surgical standpoint.  Will follow with you.

## 2014-02-01 NOTE — Progress Notes (Signed)
Patient transferred to medical floor. Report given to [facility]. Vital signs stable at transfer.

## 2014-02-01 NOTE — ED Provider Notes (Signed)
CSN: [id number]     Arrival date & time 01/31/14  2229 History   First MD Initiated Contact with Patient 01/31/14 2338     Chief Complaint  Patient presents with  . Abdominal Pain  . Hyperglycemia     (Consider location/radiation/quality/duration/timing/severity/associated sxs/prior Treatment) The history is provided by the patient and a parent.   Brent Daniels is a 28 y.o. male with a medical history significant for insulin dependent diabetes with recent history of dka and who is 3 days out from undergoing a lap chole by Dr Arnoldo Morale reporting nausea, vomiting and diffuse abdominal and back pain which radiates into his chest which has worsened today.  He has had associated subjective fevers and chills.  He has been unable to maintain any PO intake since his surgery.  His pain is constant, burning in quality and feels his back in "on fire". He denies diarrhea, dysuria and shortness of breath, but does endorse worsened abdominal pain with deep inspiration.  He was prescribed percocet post surgery but has been unable to keep this medicine down.      Past Medical History  Diagnosis Date  . Diabetes mellitus     type 1  . Arthritis   . GERD (gastroesophageal reflux disease)   . PUD (peptic ulcer disease)   . Hypertension   . Heart murmur     valvular stenosis  . Neuropathy   . Diabetic gastroparesis associated with type 1 diabetes mellitus   . S/P arthroscopic knee surgery 07/04/2011  . Scoliosis 07/11/2012  . Chronic back pain   . Chronic abdominal pain   . Nausea and vomiting     chronic, recurrent  . Acute esophagitis   . Gastroparesis   . Erosive esophagitis 12/23/2013    "severe" per EGD   . Insomnia   . Anxiety   . Hypercholesteremia    Past Surgical History  Procedure Laterality Date  . Tympanostomy tube placement    . Knee arthroscopy  06/30/2011    Procedure: ARTHROSCOPY KNEE;  Surgeon: Carole Civil, MD;  Location: [facility];  Service: Orthopedics;  Laterality:  Right;  diagnostic arthroscopy  . Esophagogastroduodenoscopy (egd) with propofol  06/17/2013    Dr. Oneida Alar: two gastric ulcers in fundus, mild antral gastritis, candida esophagitis  . Esophageal biopsy  06/17/2013    Procedure: GASTRIC ULCER AND ANTRAL BIOPSIES; ESOPHAGEAL BRUSHING;  Surgeon: Danie Binder, MD;  Location: [facility];  Service: Endoscopy;;  . Esophagogastroduodenoscopy (egd) with propofol N/A 09/11/2013    Dr. [name]: abnormal hypopharynx, question massively enlarged tonsils, stomach full of food precluded exam, needs EGD for verification of ulcer healing at a later date  . Esophagogastroduodenoscopy (egd) with propofol N/A 12/23/2013    Procedure: ESOPHAGOGASTRODUODENOSCOPY (EGD) WITH PROPOFOL;  Surgeon: Daneil Dolin, MD;  Location: [facility];  Service: Endoscopy;  Laterality: N/A;  . Esophageal biopsy N/A 12/23/2013    Procedure: ESOPHAGEAL BIOPSY;  Surgeon: Daneil Dolin, MD;  Location: [facility];  Service: Endoscopy;  Laterality: N/A;   Family History  Problem Relation Age of Onset  . Cancer Father   . Alcohol abuse Father   . Pseudochol deficiency Neg Hx   . Malignant hyperthermia Neg Hx   . Hypotension Neg Hx   . Anesthesia problems Neg Hx   . Colon cancer Neg Hx    History  Substance Use Topics  . Smoking status: Current Some Day Smoker -- 0.25 packs/day for 10 years    Types: Cigarettes  .  Smokeless tobacco: Former Systems developer    Quit date: 02/17/2013     Comment: smokes 4 cigarettes a day   . Alcohol Use: No    Review of Systems  Constitutional: Positive for fever.  HENT: Negative for congestion and sore throat.   Eyes: Negative.   Respiratory: Negative for chest tightness and shortness of breath.   Gastrointestinal: Positive for nausea, vomiting and abdominal pain.  Genitourinary: Negative.   Musculoskeletal: Positive for back pain. Negative for joint swelling, arthralgias and neck pain.  Skin: Negative.  Negative for rash and wound.  Neurological: Negative for  dizziness, weakness, light-headedness, numbness and headaches.  Psychiatric/Behavioral: Negative.       Allergies  Hydrocodone; Tramadol; Ibuprofen; Naproxen; Sulfa antibiotics; and Morphine and related  Home Medications   Prior to Admission medications   Medication Sig Start Date End Date Taking? Authorizing Provider  gabapentin (NEURONTIN) 800 MG tablet Take 800 mg by mouth 3 (three) times daily.    Historical Provider, MD  insulin NPH-regular Human (NOVOLIN 70/30) (70-30) 100 UNIT/ML injection Inject 20 Units into the skin 2 (two) times daily with a meal. 15 units in the morning and 25 at night. Patient taking differently: Inject 15-25 Units into the skin 2 (two) times daily with a meal. 15 units in the morning and 25 at night. 11/17/13   Kathie Dike, MD  insulin regular (NOVOLIN R,HUMULIN R) 100 units/mL injection Inject 2-10 Units into the skin 3 (three) times daily before meals. Sliding scale.    Historical Provider, MD  lisinopril (PRINIVIL,ZESTRIL) 20 MG tablet Take 20 mg by mouth daily.    Historical Provider, MD  metoCLOPramide (REGLAN) 10 MG tablet Take 10 mg by mouth 4 (four) times daily -  before meals and at bedtime.    Historical Provider, MD  ondansetron (ZOFRAN) 4 MG tablet Take 1 tablet (4 mg total) by mouth 3 (three) times daily with meals. Patient taking differently: Take 4 mg by mouth every 8 (eight) hours as needed for nausea or vomiting.  12/09/13   Orvil Feil, NP  oxyCODONE-acetaminophen (PERCOCET) 10-325 MG per tablet Take 1 tablet by mouth every 4 (four) hours as needed for pain. 01/28/14   Jamesetta So, MD  pantoprazole (PROTONIX) 40 MG tablet Take 1 tablet (40 mg total) by mouth 2 (two) times daily before a meal. 10/01/13   Kathie Dike, MD  pravastatin (PRAVACHOL) 20 MG tablet Take 20 mg by mouth daily.  09/10/13 09/10/14  Historical Provider, MD  promethazine (PHENERGAN) 25 MG tablet Take 1 tablet (25 mg total) by mouth every 6 (six) hours as needed for nausea  or vomiting. 12/09/13   Orvil Feil, NP  tiZANidine (ZANAFLEX) 4 MG tablet Take 1 tablet by mouth every 6 (six) hours as needed for muscle spasms.  07/30/13   Historical Provider, MD   BP 158/90 mmHg  Pulse 119  Temp(Src) 98.4 F (36.9 C) (Oral)  Resp 22  Ht 6' (1.829 m)  Wt 185 lb (83.915 kg)  BMI 25.08 kg/m2  SpO2 91% Physical Exam  Constitutional: He appears well-developed and well-nourished.  Actively vomiting.  HENT:  Head: Normocephalic and atraumatic.  Eyes: Conjunctivae are normal.  Neck: Normal range of motion.  Cardiovascular: Regular rhythm, normal heart sounds and intact distal pulses.  Tachycardia present.   Pulmonary/Chest: Effort normal and breath sounds normal. He has no wheezes.  Abdominal: Soft. Bowel sounds are normal. There is generalized tenderness. There is guarding. There is no rigidity.  Surgical staples  in place, no edema, erythema, no drainage.  Musculoskeletal: Normal range of motion.  Neurological: He is alert.  Skin: Skin is warm and dry.  Psychiatric: He has a normal mood and affect.  Nursing note and vitals reviewed.   ED Course  Procedures (including critical care time) Labs Review Labs Reviewed  CBC WITH DIFFERENTIAL - Abnormal; Notable for the following:    WBC 20.7 (*)    Platelets 473 (*)    Neutrophils Relative % 86 (*)    Neutro Abs 17.8 (*)    Lymphocytes Relative 8 (*)    Monocytes Absolute 1.2 (*)    All other components within normal limits  COMPREHENSIVE METABOLIC PANEL - Abnormal; Notable for the following:    Sodium 126 (*)    Chloride 69 (*)    CO2 37 (*)    Glucose, Bld 446 (*)    BUN 33 (*)    Total Protein 8.9 (*)    Alkaline Phosphatase 145 (*)    Total Bilirubin 1.5 (*)    GFR calc non Af Amer 71 (*)    GFR calc Af Amer 82 (*)    Anion gap 20 (*)    All other components within normal limits  URINALYSIS, ROUTINE W REFLEX MICROSCOPIC - Abnormal; Notable for the following:    Specific Gravity, Urine <1.005 (*)     Glucose, UA >1000 (*)    Bilirubin Urine SMALL (*)    Ketones, ur 40 (*)    All other components within normal limits  BLOOD GAS, ARTERIAL - Abnormal; Notable for the following:    pCO2 arterial 54.8 (*)    pO2, Arterial 76.0 (*)    Bicarbonate 37.2 (*)    Acid-Base Excess 12.4 (*)    All other components within normal limits  CBG MONITORING, ED - Abnormal; Notable for the following:    Glucose-Capillary 499 (*)    All other components within normal limits  CBG MONITORING, ED - Abnormal; Notable for the following:    Glucose-Capillary 379 (*)    All other components within normal limits  LIPASE, BLOOD  LACTIC ACID, PLASMA  URINE MICROSCOPIC-ADD ON    Imaging Review No results found.   EKG Interpretation None        No results found.  Medications  0.9 %  sodium chloride infusion (1,000 mLs Intravenous New Bag/Given 02/01/14 0147)    Followed by  0.9 %  sodium chloride infusion (0 mLs Intravenous Stopped 02/01/14 0145)    Followed by  0.9 %  sodium chloride infusion (0 mLs Intravenous Stopped 02/01/14 0201)    Followed by  0.9 %  sodium chloride infusion (not administered)  dextrose 5 %-0.45 % sodium chloride infusion (not administered)  insulin regular (NOVOLIN R,HUMULIN R) 250 Units in sodium chloride 0.9 % 250 mL (1 Units/mL) infusion (3.2 Units/hr Intravenous New Bag/Given 02/01/14 0224)  HYDROmorphone (DILAUDID) injection 1 mg (1 mg Intravenous Given 02/01/14 0020)  ondansetron (ZOFRAN) injection 4 mg (4 mg Intramuscular Given 01/31/14 2331)  promethazine (PHENERGAN) 25 MG/ML injection (12.5 mg  Given 02/01/14 0052)  iohexol (OMNIPAQUE) 300 MG/ML solution 50 mL (50 mLs Oral Contrast Given 02/01/14 0209)  iohexol (OMNIPAQUE) 300 MG/ML solution 100 mL (100 mLs Intravenous Contrast Given 02/01/14 0209)  promethazine (PHENERGAN) 25 MG/ML injection (12.5 mg  Given 02/01/14 0224)   Pt was given NS per IV, dilaudid with zofran with persistent pain and nausea.  Repeated  dilaudid x 1, phenergan 12.5 mg. Persistent emesis. Repeat of  phenergan x 1 with improved nausea.      MDM   Final diagnoses:  Abdominal pain    Pt with mixed respiratory acidosis/metabolic alkalosis.  Hyperglycemia.  Corrected Na+ 132.  Abdominal pain and leukocytosis s/p lab chole.  Currently obtaining Ct abdomen.  Dr Lita Mains to dispo patient once results obtained.      Evalee Jefferson, PA-C 02/01/14 [id number]  Julianne Rice, MD 02/03/14 [phone]

## 2014-02-02 ENCOUNTER — Encounter (HOSPITAL_COMMUNITY): Payer: Self-pay | Admitting: General Surgery

## 2014-02-02 DIAGNOSIS — R1013 Epigastric pain: Secondary | ICD-10-CM

## 2014-02-02 LAB — COMPREHENSIVE METABOLIC PANEL
ALT: 26 U/L (ref 0–53)
ANION GAP: 19 — AB (ref 5–15)
AST: 12 U/L (ref 0–37)
Albumin: 3.3 g/dL — ABNORMAL LOW (ref 3.5–5.2)
Alkaline Phosphatase: 108 U/L (ref 39–117)
BUN: 15 mg/dL (ref 6–23)
CALCIUM: 9 mg/dL (ref 8.4–10.5)
CO2: 20 mmol/L (ref 19–32)
CREATININE: 1.08 mg/dL (ref 0.50–1.35)
Chloride: 92 mEq/L — ABNORMAL LOW (ref 96–112)
GFR calc Af Amer: >90 mL/min (ref >90)
GLUCOSE: 382 mg/dL — AB (ref 70–99)
Potassium: 3.9 mmol/L (ref 3.5–5.1)
Sodium: 131 mmol/L — ABNORMAL LOW (ref 135–145)
TOTAL PROTEIN: 6.5 g/dL (ref 6.0–8.3)
Total Bilirubin: 2 mg/dL — ABNORMAL HIGH (ref 0.3–1.2)

## 2014-02-02 LAB — CBC WITH DIFFERENTIAL/PLATELET
Basophils Absolute: 0.1 10*3/uL (ref 0.0–0.1)
Basophils Relative: 1 % (ref 0–1)
Eosinophils Absolute: 0.2 10*3/uL (ref 0.0–0.7)
Eosinophils Relative: 2 % (ref 0–5)
HCT: 37.1 % — ABNORMAL LOW (ref 39.0–52.0)
HEMOGLOBIN: 12.4 g/dL — AB (ref 13.0–17.0)
LYMPHS ABS: 2.4 10*3/uL (ref 0.7–4.0)
Lymphocytes Relative: 19 % (ref 12–46)
MCH: 30.3 pg (ref 26.0–34.0)
MCHC: 33.4 g/dL (ref 30.0–36.0)
MCV: 90.7 fL (ref 78.0–100.0)
MONOS PCT: 7 % (ref 3–12)
Monocytes Absolute: 0.9 10*3/uL (ref 0.1–1.0)
NEUTROS ABS: 9 10*3/uL — AB (ref 1.7–7.7)
NEUTROS PCT: 71 % (ref 43–77)
PLATELETS: 377 10*3/uL (ref 150–400)
RBC: 4.09 MIL/uL — AB (ref 4.22–5.81)
RDW: 12.9 % (ref 11.5–15.5)
WBC: 12.6 10*3/uL — AB (ref 4.0–10.5)

## 2014-02-02 LAB — GLUCOSE, CAPILLARY
Glucose-Capillary: 391 mg/dL — ABNORMAL HIGH (ref 70–99)
Glucose-Capillary: 235 mg/dL — ABNORMAL HIGH (ref 70–99)

## 2014-02-02 MED ORDER — NICOTINE 14 MG/24HR TD PT24: 14.0000 mg | MEDICATED_PATCH | Freq: Every day | TRANSDERMAL | Status: DC

## 2014-02-02 MED ORDER — SUCRALFATE 1 GM/10ML PO SUSP: 1.0000 g | Freq: Three times a day (TID) | ORAL | Status: DC

## 2014-02-02 MED ORDER — OXYCODONE-ACETAMINOPHEN 7.5-325 MG PO TABS: 1.0000 | ORAL_TABLET | ORAL | Status: DC | PRN

## 2014-02-02 MED ORDER — SENNOSIDES-DOCUSATE SODIUM 8.6-50 MG PO TABS
2.0000 | ORAL_TABLET | Freq: Two times a day (BID) | ORAL | Status: DC
  Administered 2014-02-02: 2 via ORAL
  Filled 2014-02-02: qty 2

## 2014-02-02 NOTE — Discharge Summary (Signed)
Physician Discharge Summary  Brent Daniels [id number] DOB: Dec 04, 1985 DOA: 01/31/2014  PCP: [name], MD  Admit date: 01/31/2014 Discharge date: 02/02/2014  Time spent: 15 minutes  Recommendations for Outpatient Follow-up:  1. Needs OP pain MD 2. Follow up already scheduled with Dr. Arnoldo Morale 3. Follow up with GI    Discharge Diagnoses:  Active Problems:   Abdominal pain   Discharge Condition:  fair  Diet recommendation:  Soft diet  Filed Weights   01/31/14 2305 02/01/14 0225 02/01/14 0614  Weight: 83.915 kg (185 lb) 83.915 kg (185 lb) 77.7 kg (171 lb 4.8 oz)    History of present illness:  28 y/o ? known h/o severe Esophagitis on Endo11/17/15 followed by Dr. [name], recent uncomplicatedGB surgery Dr. Arnoldo Morale 01/28/14 admitted with DKA-AG ion admit=20, Sodum 126, Cl 69. Found to have other mixed metabolic anomalies, thought 2/2 to n/v + DKA Patient admitted ot ICU, placed on Insulin Stabilizer  anion gap closed quickly , patient was able to tolerate soft diet but still had some queasiness in his abdomen. She was seen as a courtesy visit by Dr. Arnoldo Morale general surgery and he was in less pain although still nauseous. He subsequently was discharged home in stable state and encouraged follow-up Dr. Arnoldo Morale He was given 40 tablets of assessment 7.5 -325 and Will get further refills as an out issue from his primary care physician  It is recommended that he find a pain physician   Consultations:   general surgery  Discharge Exam: Filed Vitals:   02/02/14 0647  BP: 173/99  Pulse: 108  Temp: 98.6 F (37 C)  Resp: 16    General:  EOMI NCAT Cardiovascular:  S1-S2 no murmur rub or gallop Respiratory:  Clinically clear  Postsurgical changes to abdomen with staples abdomen nontender soft benign and nondistended  Discharge Instructions   Discharge Instructions    Diet - low sodium heart healthy    Complete by:  As directed      Discharge instructions     Complete by:  As directed   FOllow up woth both Dr. Arnoldo Morale and Dr. [name]     Increase activity slowly    Complete by:  As directed           Current Discharge Medication List    START taking these medications   Details  nicotine (NICODERM CQ - DOSED IN MG/24 HOURS) 14 mg/24hr patch Place 1 patch (14 mg total) onto the skin daily. Qty: 28 patch, Refills: 0    oxyCODONE-acetaminophen (PERCOCET) 7.5-325 MG per tablet Take 1 tablet by mouth every 4 (four) hours as needed for pain. Qty: 40 tablet, Refills: 0    sucralfate (CARAFATE) 1 GM/10ML suspension Take 10 mLs (1 g total) by mouth 4 (four) times daily -  with meals and at bedtime. Qty: 420 mL, Refills: 0      CONTINUE these medications which have NOT CHANGED   Details  gabapentin (NEURONTIN) 800 MG tablet Take 800 mg by mouth 3 (three) times daily.    insulin NPH-regular Human (NOVOLIN 70/30) (70-30) 100 UNIT/ML injection Inject 20 Units into the skin 2 (two) times daily with a meal. 15 units in the morning and 25 at night. Qty: 10 mL, Refills: 11    insulin regular (NOVOLIN R,HUMULIN R) 100 units/mL injection Inject 2-10 Units into the skin 3 (three) times daily before meals. Sliding scale.    lisinopril (PRINIVIL,ZESTRIL) 20 MG tablet Take 20 mg by mouth daily.    metoCLOPramide (  REGLAN) 10 MG tablet Take 10 mg by mouth 4 (four) times daily -  before meals and at bedtime.    ondansetron (ZOFRAN) 4 MG tablet Take 1 tablet (4 mg total) by mouth 3 (three) times daily with meals. Qty: 90 tablet, Refills: 5    pantoprazole (PROTONIX) 40 MG tablet Take 1 tablet (40 mg total) by mouth 2 (two) times daily before a meal. Qty: 60 tablet, Refills: 2    pravastatin (PRAVACHOL) 20 MG tablet Take 20 mg by mouth daily.     promethazine (PHENERGAN) 25 MG tablet Take 1 tablet (25 mg total) by mouth every 6 (six) hours as needed for nausea or vomiting. Qty: 40 tablet, Refills: 1    tiZANidine (ZANAFLEX) 4 MG tablet Take 1 tablet by  mouth every 6 (six) hours as needed for muscle spasms.        Allergies  Allergen Reactions  . Hydrocodone Hives  . Tramadol Anaphylaxis and Other (See Comments)    Acid Reflux  . Ibuprofen Other (See Comments)    Stomach ulcers  . Naproxen Other (See Comments)    Stomach ulcers  . Sulfa Antibiotics Other (See Comments)    Stomach ulcers  . Morphine And Related Rash   Follow-up Information    Follow up with Jamesetta So, MD. Schedule an appointment as soon as possible for a visit on 02/05/2014.   Specialty:  General Surgery   Why:  10:30am   Contact information:   [id number] [facility] [id number] [phone]        The results of significant diagnostics from this hospitalization (including imaging, microbiology, ancillary and laboratory) are listed below for reference.    Significant Diagnostic Studies: Ct Abdomen Pelvis W Contrast  02/01/2014   CLINICAL DATA:  Acute onset of generalized abdominal pain and vomiting. Status post cholecystectomy 4 days ago. Initial encounter.  EXAM: CT ABDOMEN AND PELVIS WITH CONTRAST  TECHNIQUE: Multidetector CT imaging of the abdomen and pelvis was performed using the standard protocol following bolus administration of intravenous contrast.  CONTRAST:  51mL OMNIPAQUE IOHEXOL 300 MG/ML SOLN, 135mL OMNIPAQUE IOHEXOL 300 MG/ML SOLN  COMPARISON:  CT of the abdomen and pelvis from 11/29/2013, and abdominal ultrasound performed 12/20/2013  FINDINGS: The visualized lung bases are clear. Diffuse thickening is noted at the distal esophagus. Would correlate to exclude esophagitis, and correlate with prior esophageal biopsy results.  Free air is noted tracking about the upper abdomen. This is thought to remain within normal limits 4 days status post cholecystectomy.  A small amount of fluid and air is noted tracking along the gallbladder fossa. This likely remains within normal limits given recent cholecystectomy. No free fluid is seen tracking  into the pelvis to suggest bile leak. Scattered clips are seen in the gallbladder fossa.  The liver and spleen are unremarkable in appearance. The pancreas and adrenal glands are unremarkable.  The kidneys are unremarkable in appearance. There is no evidence of hydronephrosis. No renal or ureteral stones are seen. No perinephric stranding is appreciated.  No free fluid is identified. The small bowel is unremarkable in appearance. The stomach is within normal limits. No acute vascular abnormalities are seen.  The appendix is not definitely seen; there is no evidence of appendicitis. The colon is partially filled with stool and is unremarkable in appearance.  The bladder is moderately distended and grossly unremarkable. The prostate remains normal in size. No inguinal lymphadenopathy is seen.  No acute osseous abnormalities are identified.  IMPRESSION: 1.  No acute abnormality seen to explain the patient's symptoms. 2. Small amount of fluid and air at the gallbladder fossa likely remains within normal limits given recent cholecystectomy. No evidence of bile leak. 3. Free air tracking about the upper abdomen is thought to remain within normal limits, 4 days status post cholecystectomy. 4. Diffuse thickening at the distal esophagus. Would correlate to exclude esophagitis, and correlate with prior esophageal biopsy results.   Electronically Signed   By: Garald Balding M.D.   On: 02/01/2014 02:49   Dg Chest Port 1 View  02/01/2014   CLINICAL DATA:  Laparoscopic cholecystectomy 4 days ago with abdominal pain and vomiting.  EXAM: PORTABLE CHEST - 1 VIEW  COMPARISON:  12/20/2013  FINDINGS: Lungs are adequately inflated and otherwise clear. Cardiomediastinal silhouette is within normal. Tiny amount of free right-sided subdiaphragmatic air from patient's recent cholecystectomy. Remainder of the exam is unchanged.  IMPRESSION: No active disease.   Electronically Signed   By: Marin Olp M.D.   On: 02/01/2014 08:53     Microbiology: Recent Results (from the past 240 hour(s))  MRSA PCR Screening     Status: None   Collection Time: 02/01/14  6:16 AM  Result Value Ref Range Status   MRSA by PCR NEGATIVE NEGATIVE Final    Comment:        The GeneXpert MRSA Assay (FDA approved for NASAL specimens only), is one component of a comprehensive MRSA colonization surveillance program. It is not intended to diagnose MRSA infection nor to guide or monitor treatment for MRSA infections.      Labs: Basic Metabolic Panel:  Recent Labs Lab 01/28/14 0924 02/01/14 0011 02/01/14 0536 02/01/14 0815 02/01/14 1406 02/02/14 0652  NA  --  126*  --  135 133* 131*  K  --  3.7  --  3.0* 3.3* 3.9  CL  --  69*  --  85* 87* 92*  CO2  --  37*  --  38* 34* 20  GLUCOSE 307* 446*  --  201* 196* 382*  BUN  --  33*  --  25* 19 15  CREATININE  --  1.33 1.17 0.98 0.89 1.08  CALCIUM  --  10.2  --  9.3 8.8 9.0   Liver Function Tests:  Recent Labs Lab 02/01/14 0011 02/02/14 0652  AST 23 12  ALT 44 26  ALKPHOS 145* 108  BILITOT 1.5* 2.0*  PROT 8.9* 6.5  ALBUMIN 4.4 3.3*    Recent Labs Lab 02/01/14 0011  LIPASE 17   No results for input(s): AMMONIA in the last 168 hours. CBC:  Recent Labs Lab 02/01/14 0011 02/01/14 0536 02/02/14 0652  WBC 20.7* 17.7* 12.6*  NEUTROABS 17.8*  --  9.0*  HGB 13.4 12.2* 12.4*  HCT 39.7 37.2* 37.1*  MCV 89.6 90.5 90.7  PLT 473* 422* 377   Cardiac Enzymes: No results for input(s): CKTOTAL, CKMB, CKMBINDEX, TROPONINI in the last 168 hours. BNP: BNP (last 3 results) No results for input(s): PROBNP in the last 8760 hours. CBG:  Recent Labs Lab 02/01/14 1141 02/01/14 1704 02/01/14 2031 02/02/14 0715 02/02/14 1135  GLUCAP 102* 277* 218* 391* 235*       Signed:  Nita Sells  [facility] 02/02/2014, 1:54 PM

## 2014-02-02 NOTE — Progress Notes (Signed)
UR chart review completed.  

## 2014-02-02 NOTE — Progress Notes (Signed)
Inpatient Diabetes Program Recommendations  AACE/ADA: New Consensus Statement on Inpatient Glycemic Control (2013)  Target Ranges:  Prepandial:   less than 140 mg/dL      Peak postprandial:   less than 180 mg/dL (1-2 hours)      Critically ill patients:  140 - 180 mg/dL   Results for KENT, GRABINSKI (MRN [id number]) as of 02/02/2014 11:56  Ref. Range 02/01/2014 07:30 02/01/2014 08:28 02/01/2014 09:29 02/01/2014 10:49 02/01/2014 11:41 02/01/2014 17:04 02/01/2014 20:31 02/02/2014 07:15  Glucose-Capillary Latest Range: 70-99 mg/dL 209 (H) 195 (H) 218 (H) 131 (H) 102 (H) 277 (H) 218 (H) 391 (H)   Diabetes history: DM1 Outpatient Diabetes medications: 70/30 15 units with breakfast, 70/30 25 units with supper, Novolin R 2-10 units TID with meals Current orders for Inpatient glycemic control: Levemir 7 units Q24H, Novolog 0-9 units AC, Novolog 3 units TID with meals  Inpatient Diabetes Program Recommendations Insulin - Basal: Patient has Type 1 DM and takes 70/3015 units with breakfast, 70/30 25 units with supper as an outpatient. Currently ordered: Levemir 7 units Q24H for basal insulin and fasting glucose 391 mg/dl this morning. Please consider increasing Levemir to 20 units Q24H (starting now). Correction (SSI): Please order Novolog bedtime correction scale.  Thanks, Barnie Alderman, RN, MSN, CCRN, CDE Diabetes Coordinator Inpatient Diabetes Program [phone] (Team Pager) [phone] ([facility] office) [phone] [facility] office)

## 2014-02-02 NOTE — Progress Notes (Signed)
Patient discharged with instructions, prescription, and care notes.  Verbalized understanding via teach back.  IV was removed and the site was WNL. Patient voiced no further complaints or concerns at the time of discharge.  Appointments scheduled per instructions.  Patient left the floor via w/c with staff and family in stable condition. 

## 2014-02-19 ENCOUNTER — Encounter (HOSPITAL_COMMUNITY): Payer: Self-pay | Admitting: Internal Medicine

## 2014-03-06 ENCOUNTER — Inpatient Hospital Stay (HOSPITAL_COMMUNITY)
Admission: EM | Admit: 2014-03-06 | Discharge: 2014-03-10 | DRG: 074 | Disposition: A | Discharge status: Home / Self Care | Payer: Managed Care, Other (non HMO) | Attending: Internal Medicine | Admitting: Internal Medicine

## 2014-03-06 ENCOUNTER — Emergency Department (HOSPITAL_COMMUNITY): Payer: Managed Care, Other (non HMO)

## 2014-03-06 ENCOUNTER — Encounter (HOSPITAL_COMMUNITY): Payer: Self-pay

## 2014-03-06 DIAGNOSIS — K210 Gastro-esophageal reflux disease with esophagitis: Secondary | ICD-10-CM | POA: Diagnosis present

## 2014-03-06 DIAGNOSIS — R1010 Upper abdominal pain, unspecified: Secondary | ICD-10-CM

## 2014-03-06 DIAGNOSIS — R109 Unspecified abdominal pain: Secondary | ICD-10-CM

## 2014-03-06 DIAGNOSIS — I10 Essential (primary) hypertension: Secondary | ICD-10-CM | POA: Diagnosis present

## 2014-03-06 DIAGNOSIS — M419 Scoliosis, unspecified: Secondary | ICD-10-CM | POA: Diagnosis present

## 2014-03-06 DIAGNOSIS — E1065 Type 1 diabetes mellitus with hyperglycemia: Secondary | ICD-10-CM | POA: Diagnosis present

## 2014-03-06 DIAGNOSIS — R112 Nausea with vomiting, unspecified: Secondary | ICD-10-CM | POA: Diagnosis present

## 2014-03-06 DIAGNOSIS — R079 Chest pain, unspecified: Secondary | ICD-10-CM

## 2014-03-06 DIAGNOSIS — Z794 Long term (current) use of insulin: Secondary | ICD-10-CM | POA: Diagnosis not present

## 2014-03-06 DIAGNOSIS — Z9049 Acquired absence of other specified parts of digestive tract: Secondary | ICD-10-CM | POA: Diagnosis present

## 2014-03-06 DIAGNOSIS — F17210 Nicotine dependence, cigarettes, uncomplicated: Secondary | ICD-10-CM | POA: Diagnosis present

## 2014-03-06 DIAGNOSIS — Z809 Family history of malignant neoplasm, unspecified: Secondary | ICD-10-CM

## 2014-03-06 DIAGNOSIS — B370 Candidal stomatitis: Secondary | ICD-10-CM | POA: Insufficient documentation

## 2014-03-06 DIAGNOSIS — M1990 Unspecified osteoarthritis, unspecified site: Secondary | ICD-10-CM | POA: Diagnosis present

## 2014-03-06 DIAGNOSIS — R1011 Right upper quadrant pain: Secondary | ICD-10-CM

## 2014-03-06 DIAGNOSIS — Z9114 Patient's other noncompliance with medication regimen: Secondary | ICD-10-CM | POA: Diagnosis present

## 2014-03-06 DIAGNOSIS — E872 Acidosis, unspecified: Secondary | ICD-10-CM | POA: Diagnosis not present

## 2014-03-06 DIAGNOSIS — R1110 Vomiting, unspecified: Secondary | ICD-10-CM

## 2014-03-06 DIAGNOSIS — Z79891 Long term (current) use of opiate analgesic: Secondary | ICD-10-CM | POA: Diagnosis not present

## 2014-03-06 DIAGNOSIS — R188 Other ascites: Secondary | ICD-10-CM

## 2014-03-06 DIAGNOSIS — G8918 Other acute postprocedural pain: Secondary | ICD-10-CM

## 2014-03-06 DIAGNOSIS — E1043 Type 1 diabetes mellitus with diabetic autonomic (poly)neuropathy: Principal | ICD-10-CM | POA: Diagnosis present

## 2014-03-06 DIAGNOSIS — R739 Hyperglycemia, unspecified: Secondary | ICD-10-CM

## 2014-03-06 DIAGNOSIS — E780 Pure hypercholesterolemia: Secondary | ICD-10-CM | POA: Diagnosis present

## 2014-03-06 DIAGNOSIS — K3184 Gastroparesis: Secondary | ICD-10-CM | POA: Diagnosis present

## 2014-03-06 DIAGNOSIS — G8929 Other chronic pain: Secondary | ICD-10-CM | POA: Diagnosis present

## 2014-03-06 DIAGNOSIS — I2699 Other pulmonary embolism without acute cor pulmonale: Secondary | ICD-10-CM

## 2014-03-06 DIAGNOSIS — R1084 Generalized abdominal pain: Secondary | ICD-10-CM

## 2014-03-06 LAB — RAPID URINE DRUG SCREEN, HOSP PERFORMED
Amphetamines: NOT DETECTED
Barbiturates: NOT DETECTED
Benzodiazepines: NOT DETECTED
Cocaine: NOT DETECTED
Opiates: POSITIVE — AB
Tetrahydrocannabinol: POSITIVE — AB

## 2014-03-06 LAB — COMPREHENSIVE METABOLIC PANEL
ALT: 26 U/L (ref 0–53)
AST: 28 U/L (ref 0–37)
Albumin: 4.7 g/dL (ref 3.5–5.2)
Alkaline Phosphatase: 106 U/L (ref 39–117)
Anion gap: 11 (ref 5–15)
BILIRUBIN TOTAL: 0.8 mg/dL (ref 0.3–1.2)
BUN: 16 mg/dL (ref 6–23)
CALCIUM: 10 mg/dL (ref 8.4–10.5)
CHLORIDE: 97 mmol/L (ref 96–112)
CO2: 27 mmol/L (ref 19–32)
Creatinine, Ser: 0.91 mg/dL (ref 0.50–1.35)
GFR calc Af Amer: >90 mL/min (ref >90)
GFR calc non Af Amer: >90 mL/min (ref >90)
GLUCOSE: 269 mg/dL — AB (ref 70–99)
POTASSIUM: 3.6 mmol/L (ref 3.5–5.1)
Sodium: 135 mmol/L (ref 135–145)
Total Protein: 8.4 g/dL — ABNORMAL HIGH (ref 6.0–8.3)

## 2014-03-06 LAB — CBC WITH DIFFERENTIAL/PLATELET
Basophils Absolute: 0.1 10*3/uL (ref 0.0–0.1)
Basophils Relative: 0 % (ref 0–1)
EOS PCT: 1 % (ref 0–5)
Eosinophils Absolute: 0.1 10*3/uL (ref 0.0–0.7)
HEMATOCRIT: 43.5 % (ref 39.0–52.0)
Hemoglobin: 14.6 g/dL (ref 13.0–17.0)
LYMPHS ABS: 1.9 10*3/uL (ref 0.7–4.0)
LYMPHS PCT: 14 % (ref 12–46)
MCH: 29.4 pg (ref 26.0–34.0)
MCHC: 33.6 g/dL (ref 30.0–36.0)
MCV: 87.7 fL (ref 78.0–100.0)
MONO ABS: 0.5 10*3/uL (ref 0.1–1.0)
Monocytes Relative: 4 % (ref 3–12)
Neutro Abs: 11.2 10*3/uL — ABNORMAL HIGH (ref 1.7–7.7)
Neutrophils Relative %: 81 % — ABNORMAL HIGH (ref 43–77)
PLATELETS: 352 10*3/uL (ref 150–400)
RBC: 4.96 MIL/uL (ref 4.22–5.81)
RDW: 13.5 % (ref 11.5–15.5)
WBC: 13.8 10*3/uL — ABNORMAL HIGH (ref 4.0–10.5)

## 2014-03-06 LAB — URINALYSIS, ROUTINE W REFLEX MICROSCOPIC
Bilirubin Urine: NEGATIVE
Glucose, UA: 500 mg/dL — AB
HGB URINE DIPSTICK: NEGATIVE
Ketones, ur: 15 mg/dL — AB
Leukocytes, UA: NEGATIVE
Nitrite: NEGATIVE
PH: 7 (ref 5.0–8.0)
Protein, ur: NEGATIVE mg/dL
SPECIFIC GRAVITY, URINE: 1.015 (ref 1.005–1.030)
UROBILINOGEN UA: 0.2 mg/dL (ref 0.0–1.0)

## 2014-03-06 LAB — GLUCOSE, CAPILLARY
Glucose-Capillary: 284 mg/dL — ABNORMAL HIGH (ref 70–99)
Glucose-Capillary: 347 mg/dL — ABNORMAL HIGH (ref 70–99)

## 2014-03-06 LAB — CBG MONITORING, ED
GLUCOSE-CAPILLARY: 269 mg/dL — AB (ref 70–99)
Glucose-Capillary: 236 mg/dL — ABNORMAL HIGH (ref 70–99)
Glucose-Capillary: 164 mg/dL — ABNORMAL HIGH (ref 70–99)

## 2014-03-06 LAB — LIPASE, BLOOD: Lipase: 16 U/L (ref 11–59)

## 2014-03-06 MED ORDER — POTASSIUM CHLORIDE IN NACL 20-0.9 MEQ/L-% IV SOLN
INTRAVENOUS | Status: DC
  Administered 2014-03-06 – 2014-03-08 (×5): via INTRAVENOUS

## 2014-03-06 MED ORDER — INSULIN ASPART 100 UNIT/ML ~~LOC~~ SOLN: 0.0000 [IU] | Freq: Every day | SUBCUTANEOUS | Status: DC

## 2014-03-06 MED ORDER — ONDANSETRON HCL 4 MG/2ML IJ SOLN
4.0000 mg | Freq: Four times a day (QID) | INTRAMUSCULAR | Status: DC | PRN
  Administered 2014-03-06 – 2014-03-07 (×2): 4 mg via INTRAVENOUS
  Filled 2014-03-06 (×2): qty 2

## 2014-03-06 MED ORDER — SODIUM CHLORIDE 0.9 % IV SOLN
INTRAVENOUS | Status: AC
  Administered 2014-03-06: 17:00:00 via INTRAVENOUS

## 2014-03-06 MED ORDER — INSULIN ASPART 100 UNIT/ML ~~LOC~~ SOLN: 0.0000 [IU] | SUBCUTANEOUS | Status: DC

## 2014-03-06 MED ORDER — HEPARIN SODIUM (PORCINE) 5000 UNIT/ML IJ SOLN
5000.0000 [IU] | Freq: Three times a day (TID) | INTRAMUSCULAR | Status: DC
  Administered 2014-03-06 – 2014-03-07 (×4): 5000 [IU] via SUBCUTANEOUS
  Filled 2014-03-06 (×3): qty 1

## 2014-03-06 MED ORDER — METOCLOPRAMIDE HCL 5 MG/ML IJ SOLN
10.0000 mg | Freq: Once | INTRAMUSCULAR | Status: AC
  Administered 2014-03-06: 10 mg via INTRAVENOUS
  Filled 2014-03-06: qty 2

## 2014-03-06 MED ORDER — HYDROMORPHONE HCL 1 MG/ML IJ SOLN
1.0000 mg | INTRAMUSCULAR | Status: DC | PRN
  Administered 2014-03-06 – 2014-03-09 (×16): 1 mg via INTRAVENOUS
  Filled 2014-03-06 (×16): qty 1

## 2014-03-06 MED ORDER — INSULIN ASPART 100 UNIT/ML ~~LOC~~ SOLN
10.0000 [IU] | Freq: Once | SUBCUTANEOUS | Status: AC
  Administered 2014-03-06: 10 [IU] via SUBCUTANEOUS
  Filled 2014-03-06: qty 1

## 2014-03-06 MED ORDER — ONDANSETRON HCL 4 MG PO TABS: 4.0000 mg | ORAL_TABLET | Freq: Four times a day (QID) | ORAL | Status: DC | PRN

## 2014-03-06 MED ORDER — METOCLOPRAMIDE HCL 5 MG/ML IJ SOLN
10.0000 mg | Freq: Four times a day (QID) | INTRAMUSCULAR | Status: DC
  Administered 2014-03-06 – 2014-03-10 (×15): 10 mg via INTRAVENOUS
  Filled 2014-03-06 (×15): qty 2

## 2014-03-06 MED ORDER — GI COCKTAIL ~~LOC~~
30.0000 mL | Freq: Once | ORAL | Status: AC
  Administered 2014-03-06: 30 mL via ORAL
  Filled 2014-03-06: qty 30

## 2014-03-06 MED ORDER — ONDANSETRON HCL 4 MG/2ML IJ SOLN
4.0000 mg | Freq: Once | INTRAMUSCULAR | Status: AC
  Administered 2014-03-06: 4 mg via INTRAVENOUS
  Filled 2014-03-06: qty 2

## 2014-03-06 MED ORDER — INSULIN ASPART 100 UNIT/ML ~~LOC~~ SOLN
0.0000 [IU] | SUBCUTANEOUS | Status: DC
  Administered 2014-03-06: 11 [IU] via SUBCUTANEOUS
  Administered 2014-03-06 – 2014-03-07 (×2): 8 [IU] via SUBCUTANEOUS
  Administered 2014-03-07: 3 [IU] via SUBCUTANEOUS
  Administered 2014-03-07: 8 [IU] via SUBCUTANEOUS
  Administered 2014-03-07 (×2): 5 [IU] via SUBCUTANEOUS
  Administered 2014-03-07 – 2014-03-08 (×2): 11 [IU] via SUBCUTANEOUS
  Administered 2014-03-08: 8 [IU] via SUBCUTANEOUS
  Administered 2014-03-08: 3 [IU] via SUBCUTANEOUS
  Administered 2014-03-08: 2 [IU] via SUBCUTANEOUS
  Administered 2014-03-08: 8 [IU] via SUBCUTANEOUS
  Administered 2014-03-08: 2 [IU] via SUBCUTANEOUS
  Administered 2014-03-09: 3 [IU] via SUBCUTANEOUS
  Administered 2014-03-09: 8 [IU] via SUBCUTANEOUS

## 2014-03-06 MED ORDER — HYDRALAZINE HCL 20 MG/ML IJ SOLN
5.0000 mg | INTRAMUSCULAR | Status: DC | PRN
  Administered 2014-03-06: 5 mg via INTRAVENOUS
  Filled 2014-03-06 (×2): qty 1

## 2014-03-06 MED ORDER — SODIUM CHLORIDE 0.9 % IV SOLN
Freq: Once | INTRAVENOUS | Status: AC
  Administered 2014-03-06: 1000 mL via INTRAVENOUS

## 2014-03-06 MED ORDER — HYDROMORPHONE HCL 1 MG/ML IJ SOLN
1.0000 mg | Freq: Once | INTRAMUSCULAR | Status: AC
Start: 2014-03-06 — End: 2014-03-06
  Administered 2014-03-06: 1 mg via INTRAVENOUS
  Filled 2014-03-06: qty 1

## 2014-03-06 MED ORDER — PROMETHAZINE HCL 25 MG/ML IJ SOLN
25.0000 mg | Freq: Once | INTRAMUSCULAR | Status: AC
  Administered 2014-03-06: 25 mg via INTRAVENOUS
  Filled 2014-03-06: qty 1

## 2014-03-06 MED ORDER — ONDANSETRON HCL 4 MG/2ML IJ SOLN: 4.0000 mg | Freq: Three times a day (TID) | INTRAMUSCULAR | Status: DC | PRN

## 2014-03-06 MED ORDER — FENTANYL CITRATE 0.05 MG/ML IJ SOLN
100.0000 ug | Freq: Once | INTRAMUSCULAR | Status: AC
  Administered 2014-03-06: 100 ug via INTRAVENOUS
  Filled 2014-03-06: qty 2

## 2014-03-06 NOTE — H&P (Signed)
[facility] History and Physical  LAVERL ANGERS [id number] DOB: [birth year] DOA: 03/06/2014  Referring physician: ER PCP: [name], MD   Chief Complaint: Intractable nausea and vomiting.  HPI: JOVON LETTER is a 29 y.o. male  This is a 29 year old man who has type 1 diabetes for the last 20 years, who presents now with at least 24 hours history of intractable nausea and vomiting. He does have a history of gastroparesis and has seen gastroenterology in the past and this appears to be a similar presentation. Thankfully he does not appear to be in DKA on this admission. He has abdominal pain with his nausea and vomiting. He does have a history of chronic abdominal pain and he is awaiting to see a pain clinic. He is having difficulty getting into one. He denies any fever. There is no diarrhea. There is no hematemesis or rectal bleeding. There is no chest pain, dyspnea or palpitations. He is now being admitted to control his gastroparesis.   Review of Systems:  Apart from symptoms above, all systems negative.   Past Medical History  Diagnosis Date  . Diabetes mellitus     type 1  . Arthritis   . GERD (gastroesophageal reflux disease)   . PUD (peptic ulcer disease)   . Hypertension   . Heart murmur     valvular stenosis  . Neuropathy   . Diabetic gastroparesis associated with type 1 diabetes mellitus   . S/P arthroscopic knee surgery 07/04/2011  . Scoliosis 07/11/2012  . Chronic back pain   . Chronic abdominal pain   . Nausea and vomiting     chronic, recurrent  . Acute esophagitis   . Gastroparesis   . Erosive esophagitis 12/23/2013    "severe" per EGD   . Insomnia   . Anxiety   . Hypercholesteremia    Past Surgical History  Procedure Laterality Date  . Tympanostomy tube placement    . Knee arthroscopy  06/30/2011    Procedure: ARTHROSCOPY KNEE;  Surgeon: Carole Civil, MD;  Location: [facility];  Service: Orthopedics;  Laterality: Right;  diagnostic  arthroscopy  . Esophagogastroduodenoscopy (egd) with propofol  06/17/2013    Dr. Oneida Alar: two gastric ulcers in fundus, mild antral gastritis, candida esophagitis  . Esophageal biopsy  06/17/2013    Procedure: GASTRIC ULCER AND ANTRAL BIOPSIES; ESOPHAGEAL BRUSHING;  Surgeon: Danie Binder, MD;  Location: [facility];  Service: Endoscopy;;  . Esophagogastroduodenoscopy (egd) with propofol N/A 09/11/2013    Dr. [name]: abnormal hypopharynx, question massively enlarged tonsils, stomach full of food precluded exam, needs EGD for verification of ulcer healing at a later date  . Esophagogastroduodenoscopy (egd) with propofol N/A 12/23/2013    Procedure: ESOPHAGOGASTRODUODENOSCOPY (EGD) WITH PROPOFOL;  Surgeon: Daneil Dolin, MD;  Location: [facility];  Service: Endoscopy;  Laterality: N/A;  . Esophageal biopsy N/A 12/23/2013    Procedure: ESOPHAGEAL BIOPSY;  Surgeon: Daneil Dolin, MD;  Location: [facility];  Service: Endoscopy;  Laterality: N/A;  . Cholecystectomy N/A 01/28/2014    Procedure: LAPAROSCOPIC CHOLECYSTECTOMY;  Surgeon: Jamesetta So, MD;  Location: [facility];  Service: General;  Laterality: N/A;   Social History:  reports that he has been smoking Cigarettes.  He has a 2.5 pack-year smoking history. He quit smokeless tobacco use about 12 months ago. He reports that he does not drink alcohol or use illicit drugs.  Allergies  Allergen Reactions  . Hydrocodone Hives  . Tramadol Anaphylaxis and Other (See Comments)  Acid Reflux  . Ibuprofen Other (See Comments)    Stomach ulcers  . Naproxen Other (See Comments)    Stomach ulcers  . Sulfa Antibiotics Other (See Comments)    Stomach ulcers  . Morphine And Related Rash    Family History  Problem Relation Age of Onset  . Cancer Father   . Alcohol abuse Father   . Pseudochol deficiency Neg Hx   . Malignant hyperthermia Neg Hx   . Hypotension Neg Hx   . Anesthesia problems Neg Hx   . Colon cancer Neg Hx      Prior to Admission medications     Medication Sig Start Date End Date Taking? Authorizing Provider  gabapentin (NEURONTIN) 800 MG tablet Take 800 mg by mouth 3 (three) times daily.   Yes Historical Provider, MD  insulin NPH-regular Human (NOVOLIN 70/30) (70-30) 100 UNIT/ML injection Inject 20 Units into the skin 2 (two) times daily with a meal. 15 units in the morning and 25 at night. Patient taking differently: Inject 15-25 Units into the skin 2 (two) times daily with a meal. 15 units in the morning and 25 at night. 11/17/13  Yes Kathie Dike, MD  insulin regular (NOVOLIN R,HUMULIN R) 100 units/mL injection Inject 2-10 Units into the skin 3 (three) times daily before meals. Sliding scale.   Yes Historical Provider, MD  lisinopril (PRINIVIL,ZESTRIL) 20 MG tablet Take 20 mg by mouth daily.   Yes Historical Provider, MD  metoCLOPramide (REGLAN) 10 MG tablet Take 10 mg by mouth 4 (four) times daily -  before meals and at bedtime.   Yes Historical Provider, MD  ondansetron (ZOFRAN) 4 MG tablet Take 1 tablet (4 mg total) by mouth 3 (three) times daily with meals. Patient taking differently: Take 4 mg by mouth every 8 (eight) hours as needed for nausea or vomiting.  12/09/13  Yes Orvil Feil, NP  pantoprazole (PROTONIX) 40 MG tablet Take 1 tablet (40 mg total) by mouth 2 (two) times daily before a meal. 10/01/13  Yes Kathie Dike, MD  pravastatin (PRAVACHOL) 20 MG tablet Take 20 mg by mouth daily.  09/10/13 09/10/14 Yes Historical Provider, MD  promethazine (PHENERGAN) 25 MG tablet Take 1 tablet (25 mg total) by mouth every 6 (six) hours as needed for nausea or vomiting. 12/09/13  Yes Orvil Feil, NP  tiZANidine (ZANAFLEX) 4 MG tablet Take 1 tablet by mouth every 6 (six) hours as needed for muscle spasms.  07/30/13  Yes Historical Provider, MD  nicotine (NICODERM CQ - DOSED IN MG/24 HOURS) 14 mg/24hr patch Place 1 patch (14 mg total) onto the skin daily. Patient not taking: Reported on 03/06/2014 02/02/14   Nita Sells, MD   oxyCODONE-acetaminophen (PERCOCET) 7.5-325 MG per tablet Take 1 tablet by mouth every 4 (four) hours as needed for pain. Patient not taking: Reported on 03/06/2014 02/02/14   Nita Sells, MD  sucralfate (CARAFATE) 1 GM/10ML suspension Take 10 mLs (1 g total) by mouth 4 (four) times daily -  with meals and at bedtime. Patient not taking: Reported on 03/06/2014 02/02/14   Nita Sells, MD   Physical Exam: Filed Vitals:   03/06/14 1316 03/06/14 1517  BP: 182/121 169/102  Pulse: 120 107  Temp: 98.9 F (37.2 C)   TempSrc: Oral   Resp: 22 22  Height: 6\' 1"  (1.854 m)   Weight: 81.647 kg (180 lb)   SpO2: 100% 100%    Wt Readings from Last 3 Encounters:  03/06/14 81.647 kg (180 lb)  02/01/14 77.7 kg (171 lb 4.8 oz)  01/03/14 77.11 kg (170 lb)    General:  Appears in pain. Not clinically dehydrated at this point. He does not have Kussmaul respiration. Eyes: PERRL, normal lids, irises & conjunctiva ENT: grossly normal hearing, lips & tongue Neck: no LAD, masses or thyromegaly Cardiovascular: RRR, no m/r/g. No LE edema. Telemetry: SR, no arrhythmias  Respiratory: CTA bilaterally, no w/r/r. Normal respiratory effort. Abdomen: soft, nonspecific mild tenderness. Bowel sounds are heard but scanty. Skin: no rash or induration seen on limited exam Musculoskeletal: grossly normal tone BUE/BLE Psychiatric: grossly normal mood and affect, speech fluent and appropriate Neurologic: grossly non-focal.          Labs on Admission:  Basic Metabolic Panel:  Recent Labs Lab 03/06/14 1321  NA 135  K 3.6  CL 97  CO2 27  GLUCOSE 269*  BUN 16  CREATININE 0.91  CALCIUM 10.0   Liver Function Tests:  Recent Labs Lab 03/06/14 1321  AST 28  ALT 26  ALKPHOS 106  BILITOT 0.8  PROT 8.4*  ALBUMIN 4.7    Recent Labs Lab 03/06/14 1321  LIPASE 16   No results for input(s): AMMONIA in the last 168 hours. CBC:  Recent Labs Lab 03/06/14 1321  WBC 13.8*  NEUTROABS 11.2*   HGB 14.6  HCT 43.5  MCV 87.7  PLT 352   Cardiac Enzymes: No results for input(s): CKTOTAL, CKMB, CKMBINDEX, TROPONINI in the last 168 hours.  BNP (last 3 results) No results for input(s): PROBNP in the last 8760 hours. CBG:  Recent Labs Lab 03/06/14 1351 03/06/14 1525 03/06/14 1618  GLUCAP 269* 236* 164*    Radiological Exams on Admission: Dg Abd 2 Views  03/06/2014   CLINICAL DATA:  Generalized abdominal pain, back pain, vomiting  EXAM: ABDOMEN - 2 VIEW  COMPARISON:  None.  FINDINGS: There is nonspecific nonobstructive bowel gas pattern. No free abdominal air is noted. Postcholecystectomy surgical clips are noted.  IMPRESSION: Negative.   Electronically Signed   By: Lahoma Crocker M.D.   On: 03/06/2014 14:30      Assessment/Plan   1. Intractable nausea and vomiting. This is likely secondary to the gastroparesis. He will be treated with intravenous metoclopramide and when necessary Zofran. He'll be given IV fluids. Nothing by mouth. 2. Abdominal pain. I suspect this is multifactorial but part of the pain is secondary to his gastroparesis. He will be treated as needed. 3. Diabetic gastroparesis. We will ask gastroenterology to see him again. 4. Type 1 diabetes mellitus. Tight control of his diabetes with sliding scale of insulin. 5. Hypertension, uncontrolled. Since he is nothing by mouth, he will be treated with intravenous hydralazine as required for systolic blood pressure above 160.  Further recommendations will depend on patient's hospital progress.   Code Status: Full code.  DVT Prophylaxis: Heparin.  Family Communication: I discussed the plan with the patient at the bedside.   Disposition Plan: Home when medically stable.   Time spent: 60 minutes.  Doree Albee [facility] Pager [phone].

## 2014-03-06 NOTE — Progress Notes (Signed)
Pharmacy called and stated that pt has orders for both night coverage insulin and q4hr sliding scale. MD called and stated to cancel night time coverage and continue with sliding scale since pt is NPO. Notified pharmacy.

## 2014-03-06 NOTE — ED Notes (Signed)
Pt reports n/v and abd pain since last night.  Denies diarrhea.  LBM was this morning.  Reports vomiting bright red blood, says he thinks he has an ulcer.

## 2014-03-06 NOTE — ED Provider Notes (Signed)
CSN: [id number]     Arrival date & time 03/06/14  1308 History   First MD Initiated Contact with Patient 03/06/14 1320     Chief Complaint  Patient presents with  . Abdominal Pain     (Consider location/radiation/quality/duration/timing/severity/associated sxs/prior Treatment) HPI Comments: The patient is a 29 year old male with a history of type 1 diabetes, peptic ulcer disease as seen on endoscopy, diabetic gastroparesis, esophagitis, cholecystectomy in December 2015 approximately one month ago. He states that he  has had intermittent nausea and vomiting which is gradually worsening over the last several days. Has not had anything to eat in the last 24 hours. Symptoms are persistent, severe, gradually worsening, associated with epigastric pain. he also reports a small mount of blood when he vomits. He denies diarrhea, swelling, fever, chills, blurred vision.   Patient is a 29 y.o. male presenting with abdominal pain. The history is provided by the patient.  Abdominal Pain   Past Medical History  Diagnosis Date  . Diabetes mellitus     type 1  . Arthritis   . GERD (gastroesophageal reflux disease)   . PUD (peptic ulcer disease)   . Hypertension   . Heart murmur     valvular stenosis  . Neuropathy   . Diabetic gastroparesis associated with type 1 diabetes mellitus   . S/P arthroscopic knee surgery 07/04/2011  . Scoliosis 07/11/2012  . Chronic back pain   . Chronic abdominal pain   . Nausea and vomiting     chronic, recurrent  . Acute esophagitis   . Gastroparesis   . Erosive esophagitis 12/23/2013    "severe" per EGD   . Insomnia   . Anxiety   . Hypercholesteremia    Past Surgical History  Procedure Laterality Date  . Tympanostomy tube placement    . Knee arthroscopy  06/30/2011    Procedure: ARTHROSCOPY KNEE;  Surgeon: Carole Civil, MD;  Location: [facility];  Service: Orthopedics;  Laterality: Right;  diagnostic arthroscopy  . Esophagogastroduodenoscopy (egd) with  propofol  06/17/2013    Dr. Oneida Alar: two gastric ulcers in fundus, mild antral gastritis, candida esophagitis  . Esophageal biopsy  06/17/2013    Procedure: GASTRIC ULCER AND ANTRAL BIOPSIES; ESOPHAGEAL BRUSHING;  Surgeon: Danie Binder, MD;  Location: [facility];  Service: Endoscopy;;  . Esophagogastroduodenoscopy (egd) with propofol N/A 09/11/2013    Dr. [name]: abnormal hypopharynx, question massively enlarged tonsils, stomach full of food precluded exam, needs EGD for verification of ulcer healing at a later date  . Esophagogastroduodenoscopy (egd) with propofol N/A 12/23/2013    Procedure: ESOPHAGOGASTRODUODENOSCOPY (EGD) WITH PROPOFOL;  Surgeon: Daneil Dolin, MD;  Location: [facility];  Service: Endoscopy;  Laterality: N/A;  . Esophageal biopsy N/A 12/23/2013    Procedure: ESOPHAGEAL BIOPSY;  Surgeon: Daneil Dolin, MD;  Location: [facility];  Service: Endoscopy;  Laterality: N/A;  . Cholecystectomy N/A 01/28/2014    Procedure: LAPAROSCOPIC CHOLECYSTECTOMY;  Surgeon: Jamesetta So, MD;  Location: [facility];  Service: General;  Laterality: N/A;   Family History  Problem Relation Age of Onset  . Cancer Father   . Alcohol abuse Father   . Pseudochol deficiency Neg Hx   . Malignant hyperthermia Neg Hx   . Hypotension Neg Hx   . Anesthesia problems Neg Hx   . Colon cancer Neg Hx    History  Substance Use Topics  . Smoking status: Current Some Day Smoker -- 0.25 packs/day for 10 years    Types: Cigarettes  .  Smokeless tobacco: Former Systems developer    Quit date: 02/17/2013     Comment: smokes 4 cigarettes a day   . Alcohol Use: No    Review of Systems  Gastrointestinal: Positive for abdominal pain.  All other systems reviewed and are negative.     Allergies  Hydrocodone; Tramadol; Ibuprofen; Naproxen; Sulfa antibiotics; and Morphine and related  Home Medications   Prior to Admission medications   Medication Sig Start Date End Date Taking? Authorizing Provider  gabapentin (NEURONTIN) 800 MG  tablet Take 800 mg by mouth 3 (three) times daily.   Yes Historical Provider, MD  insulin NPH-regular Human (NOVOLIN 70/30) (70-30) 100 UNIT/ML injection Inject 20 Units into the skin 2 (two) times daily with a meal. 15 units in the morning and 25 at night. Patient taking differently: Inject 15-25 Units into the skin 2 (two) times daily with a meal. 15 units in the morning and 25 at night. 11/17/13  Yes Kathie Dike, MD  insulin regular (NOVOLIN R,HUMULIN R) 100 units/mL injection Inject 2-10 Units into the skin 3 (three) times daily before meals. Sliding scale.   Yes Historical Provider, MD  lisinopril (PRINIVIL,ZESTRIL) 20 MG tablet Take 20 mg by mouth daily.   Yes Historical Provider, MD  metoCLOPramide (REGLAN) 10 MG tablet Take 10 mg by mouth 4 (four) times daily -  before meals and at bedtime.   Yes Historical Provider, MD  ondansetron (ZOFRAN) 4 MG tablet Take 1 tablet (4 mg total) by mouth 3 (three) times daily with meals. Patient taking differently: Take 4 mg by mouth every 8 (eight) hours as needed for nausea or vomiting.  12/09/13  Yes Orvil Feil, NP  pantoprazole (PROTONIX) 40 MG tablet Take 1 tablet (40 mg total) by mouth 2 (two) times daily before a meal. 10/01/13  Yes Kathie Dike, MD  pravastatin (PRAVACHOL) 20 MG tablet Take 20 mg by mouth daily.  09/10/13 09/10/14 Yes Historical Provider, MD  promethazine (PHENERGAN) 25 MG tablet Take 1 tablet (25 mg total) by mouth every 6 (six) hours as needed for nausea or vomiting. 12/09/13  Yes Orvil Feil, NP  tiZANidine (ZANAFLEX) 4 MG tablet Take 1 tablet by mouth every 6 (six) hours as needed for muscle spasms.  07/30/13  Yes Historical Provider, MD  nicotine (NICODERM CQ - DOSED IN MG/24 HOURS) 14 mg/24hr patch Place 1 patch (14 mg total) onto the skin daily. Patient not taking: Reported on 03/06/2014 02/02/14   Nita Sells, MD  oxyCODONE-acetaminophen (PERCOCET) 7.5-325 MG per tablet Take 1 tablet by mouth every 4 (four) hours as needed  for pain. Patient not taking: Reported on 03/06/2014 02/02/14   Nita Sells, MD  sucralfate (CARAFATE) 1 GM/10ML suspension Take 10 mLs (1 g total) by mouth 4 (four) times daily -  with meals and at bedtime. Patient not taking: Reported on 03/06/2014 02/02/14   Nita Sells, MD   BP 169/102 mmHg  Pulse 107  Temp(Src) 98.9 F (37.2 C) (Oral)  Resp 22  Ht 6\' 1"  (1.854 m)  Wt 180 lb (81.647 kg)  BMI 23.75 kg/m2  SpO2 100% Physical Exam  Constitutional: He appears well-developed and well-nourished. He appears distressed.  HENT:  Head: Normocephalic and atraumatic.  Mouth/Throat: Oropharynx is clear and moist. No oropharyngeal exudate.  Eyes: Conjunctivae and EOM are normal. Pupils are equal, round, and reactive to light. Right eye exhibits no discharge. Left eye exhibits no discharge. No scleral icterus.  Neck: Normal range of motion. Neck supple. No JVD  present. No thyromegaly present.  Cardiovascular: Regular rhythm, normal heart sounds and intact distal pulses.  Exam reveals no gallop and no friction rub.   No murmur heard. tachycardic  Pulmonary/Chest: Effort normal and breath sounds normal. No respiratory distress. He has no wheezes. He has no rales.  Abdominal: Soft. Bowel sounds are normal. He exhibits no distension and no mass. There is tenderness.  The patient has reproducible tenderness on epigastric palpation, no distention, no tympanitic sounds to percussion, nonsurgical abdomen  Musculoskeletal: Normal range of motion. He exhibits no edema or tenderness.  Lymphadenopathy:    He has no cervical adenopathy.  Neurological: He is alert. Coordination normal.  Skin: Skin is warm and dry. No rash noted. No erythema.  Psychiatric: He has a normal mood and affect. His behavior is normal.  Nursing note and vitals reviewed.   ED Course  Procedures (including critical care time) Labs Review Labs Reviewed  CBC WITH DIFFERENTIAL/PLATELET - Abnormal; Notable for the  following:    WBC 13.8 (*)    Neutrophils Relative % 81 (*)    Neutro Abs 11.2 (*)    All other components within normal limits  COMPREHENSIVE METABOLIC PANEL - Abnormal; Notable for the following:    Glucose, Bld 269 (*)    Total Protein 8.4 (*)    All other components within normal limits  CBG MONITORING, ED - Abnormal; Notable for the following:    Glucose-Capillary 269 (*)    All other components within normal limits  CBG MONITORING, ED - Abnormal; Notable for the following:    Glucose-Capillary 236 (*)    All other components within normal limits  LIPASE, BLOOD  URINALYSIS, ROUTINE W REFLEX MICROSCOPIC  URINE RAPID DRUG SCREEN (HOSP PERFORMED)    Imaging Review Dg Abd 2 Views  03/06/2014   CLINICAL DATA:  Generalized abdominal pain, back pain, vomiting  EXAM: ABDOMEN - 2 VIEW  COMPARISON:  None.  FINDINGS: There is nonspecific nonobstructive bowel gas pattern. No free abdominal air is noted. Postcholecystectomy surgical clips are noted.  IMPRESSION: Negative.   Electronically Signed   By: Lahoma Crocker M.D.   On: 03/06/2014 14:30    ED ECG REPORT  I personally interpreted this EKG   Date: 03/06/2014   Rate: 111  Rhythm: sinus tachycardia  QRS Axis: normal  Intervals: normal  ST/T Wave abnormalities: nonspecific T wave changes  Conduction Disutrbances:none  Narrative Interpretation:   Old EKG Reviewed: none available   MDM   Final diagnoses:  Abdominal pain  Hyperglycemia  Gastroparesis    The patient is tachycardic, persistently vomiting, has reducible epigastric tenderness concerning for either pancreatitis, peptic ulcer disease, doubt perforation, or would also consider cyclic vomiting from diabetic gastroparesis. Less likely to be bowel obstruction given his abdominal exam, labs, urinalysis, symptomatically controlled.  The pt has had ongoing N/V, he has ongoing tachycardia and persistent hypertension He is not tolerating meds well He has hx of diabetic  gastroparesis, Not makine much urine Labs otherwise no signs of DKA.  Imaging reassuring, Admit for hydration / symptom control.  D/w Dr. Anastasio Champion who will admit  Meds given in ED:  Medications  metoCLOPramide (REGLAN) injection 10 mg (not administered)  gi cocktail (Maalox,Lidocaine,Donnatal) (30 mLs Oral Given 03/06/14 1347)  ondansetron (ZOFRAN) injection 4 mg (4 mg Intravenous Given 03/06/14 1340)  ondansetron (ZOFRAN) injection 4 mg (4 mg Intravenous Given 03/06/14 1418)  HYDROmorphone (DILAUDID) injection 1 mg (1 mg Intravenous Given 03/06/14 1347)  0.9 %  sodium chloride  infusion (1,000 mLs Intravenous New Bag/Given 03/06/14 1337)  promethazine (PHENERGAN) injection 25 mg (25 mg Intravenous Given 03/06/14 1521)  fentaNYL (SUBLIMAZE) injection 100 mcg (100 mcg Intravenous Given 03/06/14 1525)  insulin aspart (novoLOG) injection 10 Units (10 Units Subcutaneous Given 03/06/14 1526)    New Prescriptions   No medications on file      Johnna Acosta, MD 03/06/14 [phone]

## 2014-03-07 ENCOUNTER — Inpatient Hospital Stay (HOSPITAL_COMMUNITY): Payer: Managed Care, Other (non HMO)

## 2014-03-07 DIAGNOSIS — D72829 Elevated white blood cell count, unspecified: Secondary | ICD-10-CM

## 2014-03-07 DIAGNOSIS — R1011 Right upper quadrant pain: Secondary | ICD-10-CM

## 2014-03-07 LAB — COMPREHENSIVE METABOLIC PANEL
ALT: 21 U/L (ref 0–53)
ANION GAP: 15 (ref 5–15)
AST: 17 U/L (ref 0–37)
Albumin: 4 g/dL (ref 3.5–5.2)
Alkaline Phosphatase: 99 U/L (ref 39–117)
BILIRUBIN TOTAL: 1.3 mg/dL — AB (ref 0.3–1.2)
BUN: 16 mg/dL (ref 6–23)
CHLORIDE: 91 mmol/L — AB (ref 96–112)
CO2: 27 mmol/L (ref 19–32)
Calcium: 9 mg/dL (ref 8.4–10.5)
Creatinine, Ser: 0.9 mg/dL (ref 0.50–1.35)
GFR calc Af Amer: >90 mL/min (ref >90)
GFR calc non Af Amer: >90 mL/min (ref >90)
GLUCOSE: 254 mg/dL — AB (ref 70–99)
Potassium: 3.4 mmol/L — ABNORMAL LOW (ref 3.5–5.1)
Sodium: 133 mmol/L — ABNORMAL LOW (ref 135–145)
Total Protein: 7.2 g/dL (ref 6.0–8.3)

## 2014-03-07 LAB — HEMOGLOBIN A1C
Hgb A1c MFr Bld: 10.6 % — ABNORMAL HIGH (ref 4.8–5.6)
MEAN PLASMA GLUCOSE: 258 mg/dL

## 2014-03-07 LAB — CBC
HCT: 39.6 % (ref 39.0–52.0)
Hemoglobin: 13.2 g/dL (ref 13.0–17.0)
MCH: 29.7 pg (ref 26.0–34.0)
MCHC: 33.3 g/dL (ref 30.0–36.0)
MCV: 89 fL (ref 78.0–100.0)
Platelets: 351 10*3/uL (ref 150–400)
RBC: 4.45 MIL/uL (ref 4.22–5.81)
RDW: 13.9 % (ref 11.5–15.5)
WBC: 19.5 10*3/uL — AB (ref 4.0–10.5)

## 2014-03-07 LAB — GLUCOSE, CAPILLARY
GLUCOSE-CAPILLARY: 294 mg/dL — AB (ref 70–99)
GLUCOSE-CAPILLARY: 253 mg/dL — AB (ref 70–99)
GLUCOSE-CAPILLARY: 203 mg/dL — AB (ref 70–99)
Glucose-Capillary: 185 mg/dL — ABNORMAL HIGH (ref 70–99)
Glucose-Capillary: 215 mg/dL — ABNORMAL HIGH (ref 70–99)
Glucose-Capillary: 237 mg/dL — ABNORMAL HIGH (ref 70–99)
Glucose-Capillary: 335 mg/dL — ABNORMAL HIGH (ref 70–99)

## 2014-03-07 MED ORDER — PANTOPRAZOLE SODIUM 40 MG IV SOLR
40.0000 mg | Freq: Two times a day (BID) | INTRAVENOUS | Status: DC
  Administered 2014-03-07 – 2014-03-09 (×6): 40 mg via INTRAVENOUS
  Filled 2014-03-07 (×6): qty 40

## 2014-03-07 MED ORDER — TECHNETIUM TC 99M MEBROFENIN IV KIT: 5.0000 | PACK | Freq: Once | INTRAVENOUS | Status: AC | PRN

## 2014-03-07 MED ORDER — SODIUM CHLORIDE 0.9 % IV SOLN
1.5000 g | Freq: Four times a day (QID) | INTRAVENOUS | Status: DC
  Administered 2014-03-07 – 2014-03-10 (×12): 1.5 g via INTRAVENOUS
  Filled 2014-03-07 (×16): qty 1.5

## 2014-03-07 MED ORDER — STERILE WATER FOR INJECTION IJ SOLN
INTRAMUSCULAR | Status: AC
  Filled 2014-03-07: qty 10

## 2014-03-07 MED ORDER — PROMETHAZINE HCL 25 MG/ML IJ SOLN
12.5000 mg | Freq: Four times a day (QID) | INTRAMUSCULAR | Status: DC | PRN
  Administered 2014-03-07 – 2014-03-09 (×8): 12.5 mg via INTRAVENOUS
  Filled 2014-03-07 (×8): qty 1

## 2014-03-07 MED ORDER — SODIUM CHLORIDE 0.9 % IV SOLN
INTRAVENOUS | Status: AC
  Filled 2014-03-07 (×3): qty 1.5

## 2014-03-07 NOTE — Progress Notes (Signed)
Patient unable to tolerate completion of HIDA.  However, early images obtained suggest a bile leak.  Discussed with Dr. Autumn Patty and Dr. Roderic Palau.  Recommend begining IV Unasyn and stopping heparin.  RUQ ultrasound in am to assess for fluid in GB fossa.  Pt may well need ERCP with stenting.

## 2014-03-07 NOTE — Progress Notes (Signed)
Utilization review Completed Ephraim Reichel RN BSN   

## 2014-03-07 NOTE — Progress Notes (Signed)
Pharmacy Note:  Unasyn per protocol  Initial antibiotics for Unasyn ordered for intra-abdominal infection.  Estimated Creatinine Clearance: 135.6 mL/min (by C-G formula based on Cr of 0.9).   Allergies  Allergen Reactions  . Hydrocodone Hives  . Tramadol Anaphylaxis and Other (See Comments)    Acid Reflux  . Ibuprofen Other (See Comments)    Stomach ulcers  . Naproxen Other (See Comments)    Stomach ulcers  . Sulfa Antibiotics Other (See Comments)    Stomach ulcers  . Morphine And Related Rash    Filed Vitals:   03/07/14 1300  BP: 158/86  Pulse: 118  Temp: 98.9 F (37.2 C)  Resp: 20    Anti-infectives    Start     Dose/Rate Route Frequency Ordered Stop   03/07/14 2000  ampicillin-sulbactam (UNASYN) 1.5 g in sodium chloride 0.9 % 50 mL IVPB     1.5 g100 mL/hr over 30 Minutes Intravenous Every 6 hours 03/07/14 1920       Assessment/Plan: 29yo male with good renal fxn.  Estimated Creatinine Clearance: 135.6 mL/min (by C-G formula based on Cr of 0.9).  Initial doses of Unasyn 1.5gm IV q6hrs. F/U labs, progress, and cultures  Ena Dawley, [facility] 03/07/2014 7:21 PM

## 2014-03-07 NOTE — Consult Note (Signed)
Referring Provider: No ref. provider found Primary Care Physician:  [name], MD Dr. Roderic Palau Primary Gastroenterologist:  Dr.Lilyanna Lunt  Reason for Consultation:  Nausea and vomiting   HPI:   29 year old gentleman with very poorly controlled insulin-dependent diabetes mellitus (hemoglobin A1c  consistently in the 10 range), cannabis exposure,  status post cholecystectomy a little over a month ago for biliary colic now admitted with ongoing upper abdominal pain, leukocytosis and refractory nonbloody emesis. Persisting leukocytosis noted, waxing and waning-19,000 this admission. LFTs okay;  lipase 10. Patient states gallbladder surgery did not help. 4 days after laparoscopic cholecystectomy, he presented to the ED where he was found to have some free air and fluid in the location of the gallbladder fossa-felt to be consistent with a four-day postoperative abdomen.  Back in November of last year, I performed an EGD and found severe ulcerative reflux esophagitis (KOH and biopsies negative for candida).  Patient reports he has only been taking omeprazole 40 mg once daily and drinking "milk" for reflux. Has been on Reglan as an outpatient. Also, oxycodone.  Urine drugs screen positive for cannabis. Patient states he does not smoke but he has multiple family members that smoke marijuana within the home and he is exposed to that smoke.  Patient complains of ongoing upper abdominal and lower chest pain which is actually persisted for couple of months now. He states he did not derive any improvement from cholecystectomy.  Past Medical History  Diagnosis Date  . Diabetes mellitus     type 1  . Arthritis   . GERD (gastroesophageal reflux disease)   . PUD (peptic ulcer disease)   . Hypertension   . Heart murmur     valvular stenosis  . Neuropathy   . Diabetic gastroparesis associated with type 1 diabetes mellitus   . S/P arthroscopic knee surgery 07/04/2011  . Scoliosis 07/11/2012  . Chronic  back pain   . Chronic abdominal pain   . Nausea and vomiting     chronic, recurrent  . Acute esophagitis   . Gastroparesis   . Erosive esophagitis 12/23/2013    "severe" per EGD   . Insomnia   . Anxiety   . Hypercholesteremia     Past Surgical History  Procedure Laterality Date  . Tympanostomy tube placement    . Knee arthroscopy  06/30/2011    Procedure: ARTHROSCOPY KNEE;  Surgeon: Carole Civil, MD;  Location: [facility];  Service: Orthopedics;  Laterality: Right;  diagnostic arthroscopy  . Esophagogastroduodenoscopy (egd) with propofol  06/17/2013    Dr. Oneida Alar: two gastric ulcers in fundus, mild antral gastritis, candida esophagitis  . Esophageal biopsy  06/17/2013    Procedure: GASTRIC ULCER AND ANTRAL BIOPSIES; ESOPHAGEAL BRUSHING;  Surgeon: Danie Binder, MD;  Location: [facility];  Service: Endoscopy;;  . Esophagogastroduodenoscopy (egd) with propofol N/A 09/11/2013    Dr. [name]: abnormal hypopharynx, question massively enlarged tonsils, stomach full of food precluded exam, needs EGD for verification of ulcer healing at a later date  . Esophagogastroduodenoscopy (egd) with propofol N/A 12/23/2013    Procedure: ESOPHAGOGASTRODUODENOSCOPY (EGD) WITH PROPOFOL;  Surgeon: Daneil Dolin, MD;  Location: [facility];  Service: Endoscopy;  Laterality: N/A;  . Esophageal biopsy N/A 12/23/2013    Procedure: ESOPHAGEAL BIOPSY;  Surgeon: Daneil Dolin, MD;  Location: [facility];  Service: Endoscopy;  Laterality: N/A;  . Cholecystectomy N/A 01/28/2014    Procedure: LAPAROSCOPIC CHOLECYSTECTOMY;  Surgeon: Jamesetta So, MD;  Location: [facility];  Service: General;  Laterality: N/A;    Prior to Admission medications   Medication Sig Start Date End Date Taking? Authorizing Provider  gabapentin (NEURONTIN) 800 MG tablet Take 800 mg by mouth 3 (three) times daily.   Yes Historical Provider, MD  insulin NPH-regular Human (NOVOLIN 70/30) (70-30) 100 UNIT/ML injection Inject 20 Units into the skin 2 (two) times  daily with a meal. 15 units in the morning and 25 at night. Patient taking differently: Inject 15-25 Units into the skin 2 (two) times daily with a meal. 15 units in the morning and 25 at night. 11/17/13  Yes Kathie Dike, MD  insulin regular (NOVOLIN R,HUMULIN R) 100 units/mL injection Inject 2-10 Units into the skin 3 (three) times daily before meals. Sliding scale.   Yes Historical Provider, MD  lisinopril (PRINIVIL,ZESTRIL) 20 MG tablet Take 20 mg by mouth daily.   Yes Historical Provider, MD  metoCLOPramide (REGLAN) 10 MG tablet Take 10 mg by mouth 4 (four) times daily -  before meals and at bedtime.   Yes Historical Provider, MD  ondansetron (ZOFRAN) 4 MG tablet Take 1 tablet (4 mg total) by mouth 3 (three) times daily with meals. Patient taking differently: Take 4 mg by mouth every 8 (eight) hours as needed for nausea or vomiting.  12/09/13  Yes Orvil Feil, NP  pantoprazole (PROTONIX) 40 MG tablet Take 1 tablet (40 mg total) by mouth 2 (two) times daily before a meal. 10/01/13  Yes Kathie Dike, MD  pravastatin (PRAVACHOL) 20 MG tablet Take 20 mg by mouth daily.  09/10/13 09/10/14 Yes Historical Provider, MD  promethazine (PHENERGAN) 25 MG tablet Take 1 tablet (25 mg total) by mouth every 6 (six) hours as needed for nausea or vomiting. 12/09/13  Yes Orvil Feil, NP  tiZANidine (ZANAFLEX) 4 MG tablet Take 1 tablet by mouth every 6 (six) hours as needed for muscle spasms.  07/30/13  Yes Historical Provider, MD  nicotine (NICODERM CQ - DOSED IN MG/24 HOURS) 14 mg/24hr patch Place 1 patch (14 mg total) onto the skin daily. Patient not taking: Reported on 03/06/2014 02/02/14   Nita Sells, MD  oxyCODONE-acetaminophen (PERCOCET) 7.5-325 MG per tablet Take 1 tablet by mouth every 4 (four) hours as needed for pain. Patient not taking: Reported on 03/06/2014 02/02/14   Nita Sells, MD  sucralfate (CARAFATE) 1 GM/10ML suspension Take 10 mLs (1 g total) by mouth 4 (four) times daily -  with  meals and at bedtime. Patient not taking: Reported on 03/06/2014 02/02/14   Nita Sells, MD    Current Facility-Administered Medications  Medication Dose Route Frequency Provider Last Rate Last Dose  . 0.9 % NaCl with KCl 20 mEq/ L  infusion   Intravenous Continuous Doree Albee, MD 125 mL/hr at 03/07/14 0922    . heparin injection 5,000 Units  5,000 Units Subcutaneous 3 times per day Doree Albee, MD   5,000 Units at 03/07/14 0518  . hydrALAZINE (APRESOLINE) injection 5 mg  5 mg Intravenous Q4H PRN Doree Albee, MD   5 mg at 03/06/14 2118  . HYDROmorphone (DILAUDID) injection 1 mg  1 mg Intravenous Q4H PRN Doree Albee, MD   1 mg at 03/07/14 0920  . insulin aspart (novoLOG) injection 0-15 Units  0-15 Units Subcutaneous Q4H Doree Albee, MD   8 Units at 03/07/14 1213  . metoCLOPramide (REGLAN) injection 10 mg  10 mg Intravenous 4 times per day Doree Albee, MD   10 mg at 03/07/14 1136  .  pantoprazole (PROTONIX) injection 40 mg  40 mg Intravenous Q12H Kathie Dike, MD   40 mg at 03/07/14 0820  . promethazine (PHENERGAN) injection 12.5 mg  12.5 mg Intravenous Q6H PRN Kathie Dike, MD   12.5 mg at 03/07/14 0820    Allergies as of 03/06/2014 - Review Complete 03/06/2014  Allergen Reaction Noted  . Hydrocodone Hives 08/23/2013  . Tramadol Anaphylaxis and Other (See Comments) 10/14/2013  . Ibuprofen Other (See Comments) 08/14/2011  . Naproxen Other (See Comments) 08/14/2011  . Sulfa antibiotics Other (See Comments) 08/14/2011  . Morphine and related Rash 01/04/2012    Family History  Problem Relation Age of Onset  . Cancer Father   . Alcohol abuse Father   . Pseudochol deficiency Neg Hx   . Malignant hyperthermia Neg Hx   . Hypotension Neg Hx   . Anesthesia problems Neg Hx   . Colon cancer Neg Hx     History   Social History  . Marital Status: Single    Spouse Name: N/A    Number of Children: N/A  . Years of Education: 12   Occupational  History  . unemployed     trying to get disability   Social History Main Topics  . Smoking status: Current Some Day Smoker -- 0.25 packs/day for 10 years    Types: Cigarettes  . Smokeless tobacco: Former Systems developer    Quit date: 02/17/2013     Comment: smokes 4 cigarettes a day   . Alcohol Use: No  . Drug Use: No  . Sexual Activity: Not on file   Other Topics Concern  . Not on file   Social History Narrative    Review of Systems:  Resp: Denies dyspnea at rest, dyspnea with exercise, cough, sputum, wheezing, coughing up blood, and pleurisy. GI: Denies vomiting blood, jaundice, and fecal incontinence.   Denies dysphagia or odynophagia. Derm: Denies rash, itching, dry skin, hives, moles, warts, or unhealing ulcers.  Psych: Denies depression, anxiety, memory loss, suicidal ideation, hallucinations, paranoia, and confusion. Heme: Denies bruising, bleeding, and enlarged lymph nodes.   Physical Exam: Vital signs in last 24 hours: Temp:  [98.4 F (36.9 C)-99.2 F (37.3 C)] 98.6 F (37 C) (01/30 0650) Pulse Rate:  [107-132] 132 (01/30 0650) Resp:  [16-22] 20 (01/30 0650) BP: (161-189)/(81-121) 177/83 mmHg (01/30 0650) SpO2:  [97 %-100 %] 100 % (01/30 0650) Weight:  [180 lb (81.647 kg)] 180 lb (81.647 kg) (01/29 1316) Last BM Date: 03/06/14 General:   Alert,  Well-developed, well-nourished, pleasant and cooperative in NAD Head:  Normocephalic and atraumatic. Eyes:  Sclera clear, no icterus.   Conjunctiva pink. Ears:  Normal auditory acuity. Nose:  No deformity, discharge,  or lesions. Mouth:  No deformity or lesions, dentition normal. Neck:  Supple; no masses or thyromegaly. Lungs:  Clear throughout to auscultation.   No wheezes, crackles, or rhonchi. No acute distress. Heart:  Regular rate and rhythm; no murmurs, clicks, rubs,  or gallops. Abdomen:  Nondistended. Laparoscopy port sites appear to be healing well. Positive bowel sounds. No succussion splash appreciated.Marland Kitchen He has  diffuse abdominal tenderness to palpation worse in both upper quadrants but localizes to the right upper quadrant. No appreciable mass or organomegaly.    Msk:  Symmetrical without gross deformities. Normal posture. Pulses:  Normal pulses noted. Extremities:  Without clubbing or edema. Neurologic:  Alert and  oriented x4;  grossly normal neurologically. Skin:  Intact without significant lesions or rashes. Cervical Nodes:  No significant cervical adenopathy. Psych:  Alert and cooperative. Normal mood and affect.  Intake/Output from previous day: 01/29 0701 - 01/30 0700 In: -  Out: 700 [Emesis/NG output:700] Intake/Output this shift:    Lab Results:  Recent Labs  03/06/14 1321 03/07/14 0621  WBC 13.8* 19.5*  HGB 14.6 13.2  HCT 43.5 39.6  PLT 352 351   BMET  Recent Labs  03/06/14 1321 03/07/14 0621  NA 135 133*  K 3.6 3.4*  CL 97 91*  CO2 27 27  GLUCOSE 269* 254*  BUN 16 16  CREATININE 0.91 0.90  CALCIUM 10.0 9.0   LFT  Recent Labs  03/07/14 0621  PROT 7.2  ALBUMIN 4.0  AST 17  ALT 21  ALKPHOS 99  BILITOT 1.3*   LOS: 1 day   Impression:   29 year old gentleman with very poorly controlled insulin-dependent diabetes mellitus (double digit A1 C's) admitted to the hospital with recurrent nausea, vomiting chest and abdominal pain. He is about 5 weeks out from cholecystectomy. Drug screen positive for cannabis although patient denies using directly (patient admits to second hand smoke exposure). Noncompliant with his acid suppression regimen.  Chronic narcotic use.  Nausea and vomiting almost certainly related to very poor gastric emptying in the setting of out-of-control diabetes, narcotic use and cannabis exposure. An element of cannabinoid hyperemesis syndrome is not excluded.  With ongoing narcotic use, cannabis exposure and out-of-control diabetes mellitus,  he will likely continue to have difficulties with symptomatic gastroparesis and secondary severe reflux  esophagitis..  Waxing and waning leukocytosis is bothersome. This could be somewhat explained by the stress of refractory nausea and vomiting and ulcerative reflux esophagitis. Ongoing right upper quadrant abdominal pain may well be a result of recurrent abdominal wall/diaphragm strain from ongoing nausea and vomiting. However, with his leukocytosis, we need to rule out a postoperative biliary leak which I have personally seen diagnosed as late as 6 weeks following cholecystectomy.  Recommendations:    Agree with scheduled IV metoclopramide dosing for now. Agree with twice a day PPI therapy.  He needs to come off narcotic therapy if at all possible and eliminate cannabis exposure if gastroparesis is to be successfully managed going forward.  Proceed with a HIDA scan to rule out a postoperative biliary leak  Further recommendations to follow.     Manus Rudd  03/07/2014, 12:54 PM    Notice:  This dictation was prepared with Dragon dictation along with smaller phrase technology. Any transcriptional errors that result from this process are unintentional and may not be corrected upon review.

## 2014-03-07 NOTE — Progress Notes (Signed)
[facility] PROGRESS NOTE  Brent Daniels [id number] DOB: July 18, 1985 DOA: 03/06/2014 PCP: [name], MD  Assessment/Plan: 1. Intractable nausea and vomiting. Felt to be due to gastroparesis. GI following, appreciate assistance. HIDA scan ordered to evaluate for bile leak from recent cholecystectomy. Continue on reglan, anti emetics. Patient is currently npo, will start ice chips 2. Abdominal pain. Likely related to #1 3. Diabetic gastroparesis. On reglan. Will be difficult to manage since patient is on chronic narcotics and uses cannabis 4. DM type 1. Continue sliding scale insulin. 5. Essential hypertension. Lisinopril on hold due to nausea and vomiting. Use PRN hydralazine 6. Esophagitis. Continue protonix. 7. Leukocytosis. Present since 12/2013. Patient does not appear septic or toxic. Will likely need further outpatient work up.  Code Status: full code Family Communication: discussed with patient Disposition Plan: discharge home once improved   Consultants:  Gastroenterology  Procedures:    Antibiotics:    HPI/Subjective: Complains of continued nausea, vomiting and abdominal pain  Objective: Filed Vitals:   03/07/14 1300  BP: 158/86  Pulse: 118  Temp: 98.9 F (37.2 C)  Resp: 20    Intake/Output Summary (Last 24 hours) at 03/07/14 1604 Last data filed at 03/07/14 1500  Gross per 24 hour  Intake   1000 ml  Output    700 ml  Net    300 ml   Filed Weights   03/06/14 1316  Weight: 81.647 kg (180 lb)    Exam:   General:  NAD  Cardiovascular: s1, s2, tachycardic  Respiratory: CTA B  Abdomen: soft, diffusely tender, bs+  Musculoskeletal: no edema b/l   Data Reviewed: Basic Metabolic Panel:  Recent Labs Lab 03/06/14 1321 03/07/14 0621  NA 135 133*  K 3.6 3.4*  CL 97 91*  CO2 27 27  GLUCOSE 269* 254*  BUN 16 16  CREATININE 0.91 0.90  CALCIUM 10.0 9.0   Liver Function Tests:  Recent Labs Lab 03/06/14 1321 03/07/14 0621   AST 28 17  ALT 26 21  ALKPHOS 106 99  BILITOT 0.8 1.3*  PROT 8.4* 7.2  ALBUMIN 4.7 4.0    Recent Labs Lab 03/06/14 1321  LIPASE 16   No results for input(s): AMMONIA in the last 168 hours. CBC:  Recent Labs Lab 03/06/14 1321 03/07/14 0621  WBC 13.8* 19.5*  NEUTROABS 11.2*  --   HGB 14.6 13.2  HCT 43.5 39.6  MCV 87.7 89.0  PLT 352 351   Cardiac Enzymes: No results for input(s): CKTOTAL, CKMB, CKMBINDEX, TROPONINI in the last 168 hours. BNP (last 3 results) No results for input(s): PROBNP in the last 8760 hours. CBG:  Recent Labs Lab 03/07/14 0007 03/07/14 0149 03/07/14 0418 03/07/14 0750 03/07/14 1128  GLUCAP 237* 215* 185* 294* 253*    No results found for this or any previous visit (from the past 240 hour(s)).   Studies: Dg Abd 2 Views  03/06/2014   CLINICAL DATA:  Generalized abdominal pain, back pain, vomiting  EXAM: ABDOMEN - 2 VIEW  COMPARISON:  None.  FINDINGS: There is nonspecific nonobstructive bowel gas pattern. No free abdominal air is noted. Postcholecystectomy surgical clips are noted.  IMPRESSION: Negative.   Electronically Signed   By: Lahoma Crocker M.D.   On: 03/06/2014 14:30    Scheduled Meds: . heparin  5,000 Units Subcutaneous 3 times per day  . insulin aspart  0-15 Units Subcutaneous Q4H  . metoCLOPramide (REGLAN) injection  10 mg Intravenous 4 times per day  . pantoprazole (PROTONIX) IV  40 mg Intravenous Q12H  . sterile water (preservative free)       Continuous Infusions: . 0.9 % NaCl with KCl 20 mEq / L 125 mL/hr at 03/07/14 [id number]    Active Problems:   Essential hypertension   Intractable nausea and vomiting   Chronic generalized abdominal pain   Diabetic gastroparesis associated with type 1 diabetes mellitus   Hyperglycemia    Time spent: 79mins    Telecia Larocque  [facility] Pager [phone]. If 7PM-7AM, please contact night-coverage at [email or web address], password [facility] 03/07/2014, 4:04 PM  LOS: 1 day

## 2014-03-07 NOTE — Progress Notes (Signed)
Patient has had fewer and fewer vomiting episodes since the administration of Reglan. Pt has vomited clear liquid four times since the beginning of shift. Pt states he had his gall bladder removed in December. Pt states he is upset that the MD did not prescribe Phenergan. Overall pt's blood sugars have been decreasing. Last blood sugar was 215 and pt will get coverage.

## 2014-03-08 ENCOUNTER — Inpatient Hospital Stay (HOSPITAL_COMMUNITY): Payer: Managed Care, Other (non HMO)

## 2014-03-08 DIAGNOSIS — R109 Unspecified abdominal pain: Secondary | ICD-10-CM | POA: Diagnosis present

## 2014-03-08 DIAGNOSIS — R100 Acute abdomen: Secondary | ICD-10-CM

## 2014-03-08 DIAGNOSIS — R188 Other ascites: Secondary | ICD-10-CM | POA: Insufficient documentation

## 2014-03-08 DIAGNOSIS — R1084 Generalized abdominal pain: Secondary | ICD-10-CM

## 2014-03-08 DIAGNOSIS — G8929 Other chronic pain: Secondary | ICD-10-CM

## 2014-03-08 LAB — BASIC METABOLIC PANEL
ANION GAP: 16 — AB (ref 5–15)
BUN: 12 mg/dL (ref 6–23)
CO2: 14 mmol/L — AB (ref 19–32)
Calcium: 9 mg/dL (ref 8.4–10.5)
Chloride: 104 mmol/L (ref 96–112)
Creatinine, Ser: 0.95 mg/dL (ref 0.50–1.35)
GFR calc non Af Amer: >90 mL/min (ref >90)
Glucose, Bld: 227 mg/dL — ABNORMAL HIGH (ref 70–99)
POTASSIUM: 4.5 mmol/L (ref 3.5–5.1)
SODIUM: 134 mmol/L — AB (ref 135–145)

## 2014-03-08 LAB — GLUCOSE, CAPILLARY
GLUCOSE-CAPILLARY: 293 mg/dL — AB (ref 70–99)
GLUCOSE-CAPILLARY: 325 mg/dL — AB (ref 70–99)
GLUCOSE-CAPILLARY: 190 mg/dL — AB (ref 70–99)
Glucose-Capillary: 303 mg/dL — ABNORMAL HIGH (ref 70–99)
Glucose-Capillary: 144 mg/dL — ABNORMAL HIGH (ref 70–99)
Glucose-Capillary: 194 mg/dL — ABNORMAL HIGH (ref 70–99)

## 2014-03-08 LAB — D-DIMER, QUANTITATIVE: D-Dimer, Quant: 0.57 ug/mL-FEU — ABNORMAL HIGH (ref 0.00–0.48)

## 2014-03-08 LAB — CBC
HEMATOCRIT: 40 % (ref 39.0–52.0)
Hemoglobin: 13.2 g/dL (ref 13.0–17.0)
MCH: 29.7 pg (ref 26.0–34.0)
MCHC: 33 g/dL (ref 30.0–36.0)
MCV: 89.9 fL (ref 78.0–100.0)
PLATELETS: 336 10*3/uL (ref 150–400)
RBC: 4.45 MIL/uL (ref 4.22–5.81)
RDW: 13.7 % (ref 11.5–15.5)
WBC: 23.5 10*3/uL — ABNORMAL HIGH (ref 4.0–10.5)

## 2014-03-08 LAB — COMPREHENSIVE METABOLIC PANEL
ALT: 21 U/L (ref 0–53)
AST: 19 U/L (ref 0–37)
Albumin: 4.2 g/dL (ref 3.5–5.2)
Alkaline Phosphatase: 104 U/L (ref 39–117)
Anion gap: 24 — ABNORMAL HIGH (ref 5–15)
BUN: 16 mg/dL (ref 6–23)
CALCIUM: 9.1 mg/dL (ref 8.4–10.5)
CHLORIDE: 94 mmol/L — AB (ref 96–112)
CO2: 15 mmol/L — ABNORMAL LOW (ref 19–32)
Creatinine, Ser: 1.17 mg/dL (ref 0.50–1.35)
GFR calc Af Amer: >90 mL/min (ref >90)
GFR calc non Af Amer: 82 mL/min — ABNORMAL LOW (ref >90)
Glucose, Bld: 362 mg/dL — ABNORMAL HIGH (ref 70–99)
Potassium: 4.6 mmol/L (ref 3.5–5.1)
SODIUM: 133 mmol/L — AB (ref 135–145)
Total Bilirubin: 2.3 mg/dL — ABNORMAL HIGH (ref 0.3–1.2)
Total Protein: 7.2 g/dL (ref 6.0–8.3)

## 2014-03-08 LAB — TROPONIN I: Troponin I: <0.03 ng/mL (ref <0.031)

## 2014-03-08 LAB — CK TOTAL AND CKMB (NOT AT ARMC)
CK, MB: 1.7 ng/mL (ref 0.3–4.0)
Relative Index: INVALID (ref 0.0–2.5)
Total CK: 48 U/L (ref 7–232)

## 2014-03-08 LAB — LACTIC ACID, PLASMA: Lactic Acid, Venous: 0.7 mmol/L (ref 0.5–2.0)

## 2014-03-08 MED ORDER — SODIUM CHLORIDE 0.9 % IV BOLUS (SEPSIS)
1000.0000 mL | Freq: Once | INTRAVENOUS | Status: AC
  Administered 2014-03-08: 1000 mL via INTRAVENOUS

## 2014-03-08 MED ORDER — IOHEXOL 350 MG/ML SOLN
100.0000 mL | Freq: Once | INTRAVENOUS | Status: AC | PRN
  Administered 2014-03-08: 100 mL via INTRAVENOUS

## 2014-03-08 MED ORDER — METOPROLOL TARTRATE 1 MG/ML IV SOLN
INTRAVENOUS | Status: AC
  Filled 2014-03-08: qty 5

## 2014-03-08 MED ORDER — SODIUM CHLORIDE 0.45 % IV SOLN
INTRAVENOUS | Status: DC
  Administered 2014-03-08 – 2014-03-09 (×2): via INTRAVENOUS
  Filled 2014-03-08 (×3): qty 1000

## 2014-03-08 MED ORDER — HYDROMORPHONE HCL 1 MG/ML IJ SOLN
1.0000 mg | INTRAMUSCULAR | Status: AC
  Administered 2014-03-08: 1 mg via INTRAVENOUS

## 2014-03-08 MED ORDER — SODIUM BICARBONATE 8.4 % IV SOLN
INTRAVENOUS | Status: AC
  Filled 2014-03-08: qty 100

## 2014-03-08 MED ORDER — METOPROLOL TARTRATE 1 MG/ML IV SOLN
2.5000 mg | Freq: Once | INTRAVENOUS | Status: AC
  Administered 2014-03-08: 2.5 mg via INTRAVENOUS

## 2014-03-08 NOTE — Progress Notes (Signed)
Patient continues to have a right upper quadrant abdominal pain. Significant chest pain component overnight for which he underwent a cardiac evaluation CT angiogram; all of which  negative.  White count 23,000 this morning.  Ultrasound demonstrated a 3 x 4 cm fluid collection in the gallbladder fossa. Total bilirubin 2.3 but alkaline phosphatase and transaminases normal.  Discussed case at length with the radiologist, Dr. Orlene Plum.  We re-reviewed HIDA and ultrasound.  HIDA felt not to be conclusive for a leak but suspicious. Fluid collection nonspecific but bothersome when you compare ultrasound to CT which was done some 5 weeks ago.   Vital signs in last 24 hours: Temp:  [97.8 F (36.6 C)-99.1 F (37.3 C)] 97.8 F (36.6 C) (01/31 0233) Pulse Rate:  [118-139] 139 (01/31 0233) Resp:  [20] 20 (01/31 0233) BP: (158-162)/(76-86) 161/76 mmHg (01/31 0233) SpO2:  [98 %-100 %] 100 % (01/31 0233) Last BM Date: 03/06/14 General:   Alert,   cooperative ; continues to appear not to feel well but does not appear toxic at this time.  Abdomen:  Nondistended. Positive bowel sounds no succussion splash. Continues to have localized tenderness right upper quadrant  Extremities:  Without clubbing or edema.    Intake/Output from previous day: 01/30 0701 - 01/31 0700 In: 1000 [I.V.:1000] Out: -  Intake/Output this shift:    Lab Results:  Recent Labs  03/06/14 1321 03/07/14 0621 03/08/14 0240  WBC 13.8* 19.5* 23.5*  HGB 14.6 13.2 13.2  HCT 43.5 39.6 40.0  PLT 352 351 336   BMET  Recent Labs  03/06/14 1321 03/07/14 0621 03/08/14 0240  NA 135 133* 133*  K 3.6 3.4* 4.6  CL 97 91* 94*  CO2 27 27 15*  GLUCOSE 269* 254* 362*  BUN 16 16 16   CREATININE 0.91 0.90 1.17  CALCIUM 10.0 9.0 9.1   LFT  Recent Labs  03/08/14 0240  PROT 7.2  ALBUMIN 4.2  AST 19  ALT 21  ALKPHOS 104  BILITOT 2.3*   Impression:  29 year old with poorly controlled diabetes mellitus, gastroparesis,  nausea vomiting, severe reflux esophagitis, persisting leukocytosis and right upper quadrant abdominal pain. HIDA inconclusive but suspicious for leak. Ultrasound shows a fluid collection but the study was technically limited as well.  I continue to be somewhat suspicious for a leak. Database does not surpass the threshold for an ERCP at this time.  Fluid collection could be a biloma, seroma or subhepatic abscess.  Recommendations:  Continue IV Unasyn. Continue nothing by mouth. Proceed with contrast CT the abdomen and pelvis.  Continue IV Reglan and IV twice a day PPI therapy. Further recommendations to follow.

## 2014-03-08 NOTE — Progress Notes (Addendum)
Pt requesting ice chips. Pt resting comfortably in bed. Will continue to monitor pt.

## 2014-03-08 NOTE — Progress Notes (Signed)
[facility] PROGRESS NOTE  Brent Daniels [id number] DOB: [birth year] DOA: 03/06/2014 PCP: [name], MD  Assessment/Plan: 1. Intractable nausea and vomiting. Multifactorial. Likely has an element of gastroparesis and esophagitis. Continue with reglan and anti emetics. Continue NPO. 2. Abdominal pain. Patient had a cholecystectomy done approximately 5 weeks ago. He underwent HIDA scan yesterday which could not rule out an underlying bile leak. Ultrasound of the abdomen today indicated a fluid collection in the gallbladder fossa. This will need to be further evaluated with CT. Patient will be able to receive IV contrast tomorrow, since he received IV contrast overnight. Patient has been empirically started on antibiotics. 3. Diabetic gastroparesis. On reglan. Will be difficult to manage since patient is on chronic narcotics and uses cannabis 4. DM type 1. Continue sliding scale insulin. 5. Essential hypertension. Lisinopril on hold due to nausea and vomiting. Use PRN hydralazine 6. Esophagitis. Continue protonix. 7. Leukocytosis. Present since 12/2013. Although there may be a chronic component to this, it is worrisome setting of #2. Continue antibiotics for now. 8. Metabolic acidosis. Lactic acid is normal. Suspect this is related to GI losses from vomiting. We'll start the patient on bicarbonate infusion.  Code Status: full code Family Communication: discussed with patient Disposition Plan: discharge home once improved   Consultants:  Gastroenterology  Procedures:    Antibiotics:  unasyn 1/30  HPI/Subjective: Had some chest pain overnight. Described it as a burning type of pain. With his ongoing tachycardia, he underwent CT angio of chest that did not show any pulmonary embolus. Complains of continued vomiting and abdominal pain  Objective: Filed Vitals:   03/08/14 0233  BP: 161/76  Pulse: 139  Temp: 97.8 F (36.6 C)  Resp: 20    Intake/Output Summary (Last  24 hours) at 03/08/14 1511 Last data filed at 03/07/14 1747  Gross per 24 hour  Intake      0 ml  Output      0 ml  Net      0 ml   Filed Weights   03/06/14 1316  Weight: 81.647 kg (180 lb)    Exam:   General:  Appears to be uncomfortable  Cardiovascular: s1, s2, tachycardic  Respiratory: CTA B  Abdomen: soft, diffusely tender, bs+  Musculoskeletal: no edema b/l   Data Reviewed: Basic Metabolic Panel:  Recent Labs Lab 03/06/14 1321 03/07/14 0621 03/08/14 0240  NA 135 133* 133*  K 3.6 3.4* 4.6  CL 97 91* 94*  CO2 27 27 15*  GLUCOSE 269* 254* 362*  BUN 16 16 16   CREATININE 0.91 0.90 1.17  CALCIUM 10.0 9.0 9.1   Liver Function Tests:  Recent Labs Lab 03/06/14 1321 03/07/14 0621 03/08/14 0240  AST 28 17 19   ALT 26 21 21   ALKPHOS 106 99 104  BILITOT 0.8 1.3* 2.3*  PROT 8.4* 7.2 7.2  ALBUMIN 4.7 4.0 4.2    Recent Labs Lab 03/06/14 1321  LIPASE 16   No results for input(s): AMMONIA in the last 168 hours. CBC:  Recent Labs Lab 03/06/14 1321 03/07/14 0621 03/08/14 0240  WBC 13.8* 19.5* 23.5*  NEUTROABS 11.2*  --   --   HGB 14.6 13.2 13.2  HCT 43.5 39.6 40.0  MCV 87.7 89.0 89.9  PLT 352 351 336   Cardiac Enzymes:  Recent Labs Lab 03/08/14 0240 03/08/14 0245 03/08/14 0809  CKTOTAL 48  --   --   CKMB PENDING  --   --   TROPONINI  --  <0.03 <0.03  BNP (last 3 results) No results for input(s): PROBNP in the last 8760 hours. CBG:  Recent Labs Lab 03/07/14 2021 03/08/14 0204 03/08/14 0422 03/08/14 0729 03/08/14 1135  GLUCAP 203* 293* 303* 144* 194*    No results found for this or any previous visit (from the past 240 hour(s)).   Studies: Dg Chest 1 View  03/08/2014   CLINICAL DATA:  29 year old male with mid chest pain radiating to the right upper quadrant.  EXAM: CHEST - 1 VIEW  COMPARISON:  Chest x-ray 02/01/2014.  FINDINGS: Lung volumes are normal. No consolidative airspace disease. No pleural effusions. No pneumothorax. No  pulmonary nodule or mass noted. Pulmonary vasculature and the cardiomediastinal silhouette are within normal limits.  IMPRESSION: No radiographic evidence of acute cardiopulmonary disease.   Electronically Signed   By: Vinnie Langton M.D.   On: 03/08/2014 07:29   Ct Angio Chest Pe W/cm &/or Wo Cm  03/08/2014   CLINICAL DATA:  Increasing chest pain and shortness of breath for 3 days. Recent cholecystectomy.  EXAM: CT ANGIOGRAPHY CHEST WITH CONTRAST  TECHNIQUE: Multidetector CT imaging of the chest was performed using the standard protocol during bolus administration of intravenous contrast. Multiplanar CT image reconstructions and MIPs were obtained to evaluate the vascular anatomy.  CONTRAST:  175mL OMNIPAQUE IOHEXOL 350 MG/ML SOLN  COMPARISON:  CT abdomen and pelvis 02/01/2014  FINDINGS: Technically adequate study with good opacification of the central and segmental pulmonary arteries. No focal filling defects demonstrated. No evidence of significant pulmonary embolus.  Normal heart size. Normal caliber thoracic aorta. No evidence of aortic dissection allowing for motion artifact. Great vessel origins are patent. Esophagus is decompressed. No significant lymphadenopathy in the chest.  No focal airspace disease or consolidation in the lungs. No interstitial changes. No pleural effusions. No pneumothorax. Airways appear patent.  Included portions of the upper abdominal organs demonstrate diffuse fatty infiltration of the liver. No destructive bone lesions.  Review of the MIP images confirms the above findings.  IMPRESSION: No evidence of significant pulmonary embolus. No evidence of aortic dissection. No evidence of active pulmonary disease. Diffuse fatty infiltration of the visualized liver.   Electronically Signed   By: Lucienne Capers M.D.   On: 03/08/2014 06:39   Nm Hepatobiliary Liver Func  03/07/2014   CLINICAL DATA:  Tc Choletec 5 mCi in Right wrist. Patient vomiting and in severe abdominal pain.  Patient refused to finish the scan due to level of pain and vomiting. Radiologist called and consulted before leaving the department. Patient unable to make it 30 mins and insisted on being able to go back to his room.  EXAM: NUCLEAR MEDICINE HEPATOBILIARY IMAGING  TECHNIQUE: Sequential images of the abdomen were obtained, acquired for less than 30 min as detailed above, following intravenous administration of radiopharmaceutical.  RADIOPHARMACEUTICALS:  5.0 [place] 123XX123 Choletec  COMPARISON:  CT, 02/01/2014  FINDINGS: There is homogeneous accumulation of radiotracer by the liver. During short span of this exam, no activity was seen in the intra or extrahepatic biliary tree.  Final image shows some hazy activity proceeding medially from the porta hepatis region of the liver which suggests, but is not conclusive for, a biliary leak.  IMPRESSION: 1. Exam incomplete. Patient could not tolerate the full study. The early findings do suggest a biliary leak, with some subtle on dispersing medially from the porta hepatis of the liver, and show no evidence of normal activity within the intrahepatic or extrahepatic biliary tree.   Electronically Signed   By:  Lajean Manes M.D.   On: 03/07/2014 16:28   US Abdomen Limited Ruq  03/08/2014   CLINICAL DATA:  29 year old male status post cholecystectomy on 01/28/2014. Evaluate for potential bile leak.  EXAM: US ABDOMEN LIMITED - RIGHT UPPER QUADRANT  COMPARISON:  No priors.  FINDINGS: Gallbladder:  Status post cholecystectomy. Poorly defined hypoechoic collection of material in the gallbladder fossa estimated to measure approximately 3.9 x 2.7 cm, best demonstrated on image 43.  Common bile duct:  Diameter: 4 mm.  Liver:  No focal lesion identified. Within normal limits in parenchymal echogenicity. The liver appears enlarged measuring up to 22 cm in craniocaudal span.  IMPRESSION: 1. Status post cholecystectomy with ill-defined hypoechoic collection in the region of the  gallbladder fossa suspicious for either a persistent postoperative fluid collection (e.g., seroma) or potential biloma. This is incompletely imaged by ultrasound secondary to overlying shadowing, and could be better evaluated with contrast-enhanced CT scan to evaluate the full extent of this collection, if clinically appropriate. 2. Hepatomegaly. These results will be called to the ordering clinician or representative by the Radiologist Assistant, and communication documented in the PACS or zVision Dashboard.   Electronically Signed   By: Vinnie Langton M.D.   On: 03/08/2014 09:37    Scheduled Meds: . ampicillin-sulbactam (UNASYN) IV  1.5 g Intravenous Q6H  . insulin aspart  0-15 Units Subcutaneous Q4H  . metoCLOPramide (REGLAN) injection  10 mg Intravenous 4 times per day  . pantoprazole (PROTONIX) IV  40 mg Intravenous Q12H   Continuous Infusions: . 0.9 % NaCl with KCl 20 mEq / L 125 mL/hr at 03/08/14 0116    Active Problems:   Essential hypertension   Intractable nausea and vomiting   Chronic generalized abdominal pain   Diabetic gastroparesis associated with type 1 diabetes mellitus   Hyperglycemia    Time spent: 54mins    MEMON,JEHANZEB  [facility] Pager [phone]. If 7PM-7AM, please contact night-coverage at [email or web address], password [facility] 03/08/2014, 3:11 PM  LOS: 2 days

## 2014-03-08 NOTE — Progress Notes (Addendum)
Went into pt's room to give insulin. Pt started complaining of chest pain and began grabbing at chest. Obtained pt's vitals. BP 161/76, T 97.8, HR 141 O2 100. A Stat EKG was ordered. Pt was placed on telemetry. MD was notified. MD stated to give pt metoprolol to help slow down the heart rate. D Dimer, CBC, Troponin Labs, and portable chest x ray ordered.

## 2014-03-08 NOTE — Progress Notes (Signed)
Notified MD of lab results and CT angio ordered.

## 2014-03-09 ENCOUNTER — Inpatient Hospital Stay (HOSPITAL_COMMUNITY): Payer: Managed Care, Other (non HMO)

## 2014-03-09 DIAGNOSIS — B370 Candidal stomatitis: Secondary | ICD-10-CM | POA: Insufficient documentation

## 2014-03-09 DIAGNOSIS — E872 Acidosis, unspecified: Secondary | ICD-10-CM | POA: Diagnosis not present

## 2014-03-09 LAB — BLOOD GAS, ARTERIAL
Acid-base deficit: 17 mmol/L — ABNORMAL HIGH (ref 0.0–2.0)
BICARBONATE: 8.9 meq/L — AB (ref 20.0–24.0)
Drawn by: 23534
FIO2: 0.21 %
O2 Content: 21 L/min
O2 Saturation: 97.9 %
PATIENT TEMPERATURE: 37
TCO2: 8.3 mmol/L (ref 0–100)
pCO2 arterial: 20.9 mmHg — ABNORMAL LOW (ref 35.0–45.0)
pH, Arterial: 7.252 — ABNORMAL LOW (ref 7.350–7.450)
pO2, Arterial: 118 mmHg — ABNORMAL HIGH (ref 80.0–100.0)

## 2014-03-09 LAB — COMPREHENSIVE METABOLIC PANEL
ALK PHOS: 88 U/L (ref 39–117)
ALT: 18 U/L (ref 0–53)
AST: 14 U/L (ref 0–37)
Albumin: 3.6 g/dL (ref 3.5–5.2)
Anion gap: 18 — ABNORMAL HIGH (ref 5–15)
BUN: 11 mg/dL (ref 6–23)
CALCIUM: 8.6 mg/dL (ref 8.4–10.5)
CO2: 11 mmol/L — ABNORMAL LOW (ref 19–32)
Chloride: 101 mmol/L (ref 96–112)
Creatinine, Ser: 1.02 mg/dL (ref 0.50–1.35)
GFR calc Af Amer: >90 mL/min (ref >90)
GFR calc non Af Amer: >90 mL/min (ref >90)
GLUCOSE: 242 mg/dL — AB (ref 70–99)
Potassium: 4.2 mmol/L (ref 3.5–5.1)
Sodium: 130 mmol/L — ABNORMAL LOW (ref 135–145)
Total Bilirubin: 1.9 mg/dL — ABNORMAL HIGH (ref 0.3–1.2)
Total Protein: 6.6 g/dL (ref 6.0–8.3)

## 2014-03-09 LAB — CBC
HCT: 37.3 % — ABNORMAL LOW (ref 39.0–52.0)
Hemoglobin: 12.1 g/dL — ABNORMAL LOW (ref 13.0–17.0)
MCH: 29.6 pg (ref 26.0–34.0)
MCHC: 32.4 g/dL (ref 30.0–36.0)
MCV: 91.2 fL (ref 78.0–100.0)
Platelets: 309 10*3/uL (ref 150–400)
RBC: 4.09 MIL/uL — ABNORMAL LOW (ref 4.22–5.81)
RDW: 13.7 % (ref 11.5–15.5)
WBC: 16.4 10*3/uL — AB (ref 4.0–10.5)

## 2014-03-09 LAB — GLUCOSE, CAPILLARY
GLUCOSE-CAPILLARY: 253 mg/dL — AB (ref 70–99)
GLUCOSE-CAPILLARY: 272 mg/dL — AB (ref 70–99)
Glucose-Capillary: 189 mg/dL — ABNORMAL HIGH (ref 70–99)
Glucose-Capillary: 191 mg/dL — ABNORMAL HIGH (ref 70–99)
Glucose-Capillary: 234 mg/dL — ABNORMAL HIGH (ref 70–99)
Glucose-Capillary: 246 mg/dL — ABNORMAL HIGH (ref 70–99)
Glucose-Capillary: 262 mg/dL — ABNORMAL HIGH (ref 70–99)

## 2014-03-09 LAB — KETONES, QUALITATIVE

## 2014-03-09 MED ORDER — DEXTROSE 5 % IV SOLN
INTRAVENOUS | Status: DC
  Administered 2014-03-09 – 2014-03-10 (×3): via INTRAVENOUS
  Filled 2014-03-09 (×7): qty 1000

## 2014-03-09 MED ORDER — INSULIN ASPART 100 UNIT/ML ~~LOC~~ SOLN
0.0000 [IU] | SUBCUTANEOUS | Status: DC
  Administered 2014-03-09: 5 [IU] via SUBCUTANEOUS
  Administered 2014-03-09: 3 [IU] via SUBCUTANEOUS
  Administered 2014-03-09: 5 [IU] via SUBCUTANEOUS
  Administered 2014-03-09: 3 [IU] via SUBCUTANEOUS
  Administered 2014-03-10: 5 [IU] via SUBCUTANEOUS
  Administered 2014-03-10 (×3): 2 [IU] via SUBCUTANEOUS
  Administered 2014-03-10: 3 [IU] via SUBCUTANEOUS

## 2014-03-09 MED ORDER — INSULIN GLARGINE 100 UNIT/ML ~~LOC~~ SOLN
20.0000 [IU] | Freq: Every day | SUBCUTANEOUS | Status: DC
  Administered 2014-03-09 – 2014-03-10 (×2): 20 [IU] via SUBCUTANEOUS
  Filled 2014-03-09 (×3): qty 0.2

## 2014-03-09 MED ORDER — HEPARIN SODIUM (PORCINE) 5000 UNIT/ML IJ SOLN
5000.0000 [IU] | Freq: Three times a day (TID) | INTRAMUSCULAR | Status: DC
  Administered 2014-03-09 – 2014-03-10 (×3): 5000 [IU] via SUBCUTANEOUS
  Filled 2014-03-09 (×3): qty 1

## 2014-03-09 MED ORDER — HYDROMORPHONE HCL 1 MG/ML IJ SOLN
1.0000 mg | INTRAMUSCULAR | Status: DC | PRN
  Administered 2014-03-09 – 2014-03-10 (×11): 1 mg via INTRAVENOUS
  Filled 2014-03-09 (×11): qty 1

## 2014-03-09 MED ORDER — FLUCONAZOLE 100 MG PO TABS
250.0000 mg | ORAL_TABLET | Freq: Every day | ORAL | Status: DC
  Administered 2014-03-10: 250 mg via ORAL
  Filled 2014-03-09: qty 3

## 2014-03-09 MED ORDER — SODIUM BICARBONATE 8.4 % IV SOLN
INTRAVENOUS | Status: AC
  Filled 2014-03-09: qty 100

## 2014-03-09 MED ORDER — FLUCONAZOLE 100 MG PO TABS
400.0000 mg | ORAL_TABLET | Freq: Every day | ORAL | Status: AC
  Administered 2014-03-09: 400 mg via ORAL
  Filled 2014-03-09: qty 4

## 2014-03-09 MED ORDER — IOHEXOL 300 MG/ML  SOLN
100.0000 mL | Freq: Once | INTRAMUSCULAR | Status: AC | PRN
Start: 2014-03-09 — End: 2014-03-09
  Administered 2014-03-09: 100 mL via INTRAVENOUS

## 2014-03-09 NOTE — Care Management Note (Signed)
    Page 1 of 1   03/09/2014     1:16:43 PM CARE MANAGEMENT NOTE 03/09/2014  Patient:  Brent Daniels, Brent Daniels   Account Number:  [id number]  Date Initiated:  03/09/2014  Documentation initiated by:  Theophilus Kinds  Subjective/Objective Assessment:   Pt admitted from home with nausea and vomiting. Pt lives with his mother and will return home at discharge. Pt is independent with ADl's.     Action/Plan:   Pt requesting a new MD to manage pain. Cm explained to pt that PCP are no longer managing chronic pain and he stated that the clinics turn him down. He has a PCP.   Anticipated DC Date:  03/11/2014   Anticipated DC Plan:  [place]  CM consult      Choice offered to / List presented to:             Status of service:  Completed, signed off Medicare Important Message given?   (If response is "NO", the following Medicare IM given date fields will be blank) Date Medicare IM given:   Medicare IM given by:   Date Additional Medicare IM given:   Additional Medicare IM given by:    Discharge Disposition:  HOME/SELF CARE  Per UR Regulation:    If discussed at Long Length of Stay Meetings, dates discussed:    Comments:  03/09/14 [place], RN BSN CM

## 2014-03-09 NOTE — Consult Note (Signed)
Patient seen and chart reviewed. Appreciate Dr. [name] evaluation and follow-up. CT scan of the abdomen today was negative for biloma. In talking with patient, he states the abdominal pain is similar to what he had prior to his cholecystectomy. Bile leak is farther down on differential diagnostic list given the CT scan findings. Will follow peripherally with you.

## 2014-03-09 NOTE — Progress Notes (Signed)
Subjective: Persistent RUQ pain, no relieving or exacerbating factors. Unable to tolerate a diet. Intermittent N/V. Endorses painful swallowing.   Objective: Vital signs in last 24 hours: Temp:  [98.3 F (36.8 C)-98.6 F (37 C)] 98.3 F (36.8 C) (02/01 0422) Pulse Rate:  [116-137] 116 (02/01 0422) Resp:  [20] 20 (02/01 0422) BP: (136-146)/(73-76) 146/76 mmHg (02/01 0422) SpO2:  [99 %-100 %] 100 % (02/01 0422) Last BM Date: 03/06/14 General:   Alert and oriented, flat affect, appears uncomfortable Head:  Normocephalic and atraumatic. Mouth: white plaques noted on tongue. Beefy red buccal mucosa Abdomen:  Bowel sounds present, soft, TTP diffusely with just light touch, significantly moreso RUQ Extremities:  Without edema. Neurologic:  Alert and  oriented x4 Psych:  Alert and cooperative. Flat affect  Intake/Output from previous day: 01/31 0701 - 02/01 0700 In: 4954.2 [I.V.:2754.2; IV Piggyback:2200] Out: -  Intake/Output this shift:    Lab Results:  Recent Labs  03/07/14 0621 03/08/14 0240 03/09/14 0722  WBC 19.5* 23.5* 16.4*  HGB 13.2 13.2 12.1*  HCT 39.6 40.0 37.3*  PLT 351 336 309   BMET  Recent Labs  03/08/14 0240 03/08/14 1436 03/09/14 0722  NA 133* 134* 130*  K 4.6 4.5 4.2  CL 94* 104 101  CO2 15* 14* 11*  GLUCOSE 362* 227* 242*  BUN 16 12 11   CREATININE 1.17 0.95 1.02  CALCIUM 9.1 9.0 8.6   LFT  Recent Labs  03/07/14 0621 03/08/14 0240 03/09/14 0722  PROT 7.2 7.2 6.6  ALBUMIN 4.0 4.2 3.6  AST 17 19 14   ALT 21 21 18   ALKPHOS 99 104 88  BILITOT 1.3* 2.3* 1.9*    Studies/Results: Dg Chest 1 View  03/08/2014   CLINICAL DATA:  29 year old male with mid chest pain radiating to the right upper quadrant.  EXAM: CHEST - 1 VIEW  COMPARISON:  Chest x-ray 02/01/2014.  FINDINGS: Lung volumes are normal. No consolidative airspace disease. No pleural effusions. No pneumothorax. No pulmonary nodule or mass noted. Pulmonary vasculature and the  cardiomediastinal silhouette are within normal limits.  IMPRESSION: No radiographic evidence of acute cardiopulmonary disease.   Electronically Signed   By: Vinnie Langton M.D.   On: 03/08/2014 07:29   Ct Angio Chest Pe W/cm &/or Wo Cm  03/08/2014   CLINICAL DATA:  Increasing chest pain and shortness of breath for 3 days. Recent cholecystectomy.  EXAM: CT ANGIOGRAPHY CHEST WITH CONTRAST  TECHNIQUE: Multidetector CT imaging of the chest was performed using the standard protocol during bolus administration of intravenous contrast. Multiplanar CT image reconstructions and MIPs were obtained to evaluate the vascular anatomy.  CONTRAST:  117mL OMNIPAQUE IOHEXOL 350 MG/ML SOLN  COMPARISON:  CT abdomen and pelvis 02/01/2014  FINDINGS: Technically adequate study with good opacification of the central and segmental pulmonary arteries. No focal filling defects demonstrated. No evidence of significant pulmonary embolus.  Normal heart size. Normal caliber thoracic aorta. No evidence of aortic dissection allowing for motion artifact. Great vessel origins are patent. Esophagus is decompressed. No significant lymphadenopathy in the chest.  No focal airspace disease or consolidation in the lungs. No interstitial changes. No pleural effusions. No pneumothorax. Airways appear patent.  Included portions of the upper abdominal organs demonstrate diffuse fatty infiltration of the liver. No destructive bone lesions.  Review of the MIP images confirms the above findings.  IMPRESSION: No evidence of significant pulmonary embolus. No evidence of aortic dissection. No evidence of active pulmonary disease. Diffuse fatty infiltration of the  visualized liver.   Electronically Signed   By: Lucienne Capers M.D.   On: 03/08/2014 06:39   Nm Hepatobiliary Liver Func  03/07/2014   CLINICAL DATA:  Tc Choletec 5 mCi in Right wrist. Patient vomiting and in severe abdominal pain. Patient refused to finish the scan due to level of pain and  vomiting. Radiologist called and consulted before leaving the department. Patient unable to make it 30 mins and insisted on being able to go back to his room.  EXAM: NUCLEAR MEDICINE HEPATOBILIARY IMAGING  TECHNIQUE: Sequential images of the abdomen were obtained, acquired for less than 30 min as detailed above, following intravenous administration of radiopharmaceutical.  RADIOPHARMACEUTICALS:  5.0 [employer] 123XX123 Choletec  COMPARISON:  CT, 02/01/2014  FINDINGS: There is homogeneous accumulation of radiotracer by the liver. During short span of this exam, no activity was seen in the intra or extrahepatic biliary tree.  Final image shows some hazy activity proceeding medially from the porta hepatis region of the liver which suggests, but is not conclusive for, a biliary leak.  IMPRESSION: 1. Exam incomplete. Patient could not tolerate the full study. The early findings do suggest a biliary leak, with some subtle on dispersing medially from the porta hepatis of the liver, and show no evidence of normal activity within the intrahepatic or extrahepatic biliary tree.   Electronically Signed   By: Lajean Manes M.D.   On: 03/07/2014 16:28   Ct Abdomen Pelvis W Contrast  03/09/2014   CLINICAL DATA:  29 year old male with history of cholecystectomy 5 weeks ago. Possible postoperative fluid collection in the gallbladder fossa noted on ultrasound examination 03/08/2014. Clinically suspected biliary leak.  EXAM: CT ABDOMEN AND PELVIS WITH CONTRAST  TECHNIQUE: Multidetector CT imaging of the abdomen and pelvis was performed using the standard protocol following bolus administration of intravenous contrast.  CONTRAST:  143mL OMNIPAQUE IOHEXOL 300 MG/ML  SOLN  COMPARISON:  CT of the abdomen and pelvis 02/01/2014. Abdominal ultrasound 03/08/2014.  FINDINGS: Lower chest:  Unremarkable.  Hepatobiliary: Diffuse low attenuation throughout the hepatic parenchyma, indicative of hepatic steatosis. No discrete cystic or solid hepatic  lesions. No intra or extrahepatic biliary ductal dilatation. Status post cholecystectomy. No definite fluid collection identified within the gallbladder fossa on today's examination. No surrounding fluid or other fluid collections identified near the undersurface of the liver.  Pancreas: Unremarkable.  Spleen: Unremarkable.  Adrenals/Urinary Tract: Normal appearance of the kidneys and adrenal glands bilaterally. No hydroureteronephrosis. Urinary bladder is normal in appearance.  Stomach/Bowel: Normal appearance of the stomach. No pathologic dilatation of small bowel or colon. Normal appendix.  Vascular/Lymphatic: Mild atherosclerosis in the abdominal and pelvic vasculature, without evidence of aneurysm or dissection. No lymphadenopathy noted in the abdomen or pelvis.  Reproductive: Prostate gland and seminal vesicles are normal in appearance.  Other: Trace volume of ascites in the lower abdomen and pelvis, presumably related to recent surgery. No larger volume of ascites or focal loculated fluid collection within the peritoneal cavity in the abdomen or pelvis. No pneumoperitoneum.  Musculoskeletal: Small amount of gas in the subcutaneous fat of the anterior abdominal wall, presumably iatrogenic in this patient with history of type 1 diabetes. There are no aggressive appearing lytic or blastic lesions noted in the visualized portions of the skeleton.  IMPRESSION: 1. Status post cholecystectomy. No unexpected postoperative fluid collection identified in the gallbladder fossa. The finding on the recent ultrasound examination was presumably a fluid-filled loop of duodenum. No suspicious fluid collections elsewhere in the abdomen or pelvis to  suggests a biliary leak or biloma. 2. Low-attenuation in the hepatic parenchyma, indicative of hepatic steatosis. 3. Trace volume of ascites. 4. Additional incidental findings, as above.   Electronically Signed   By: Vinnie Langton M.D.   On: 03/09/2014 07:52   US Abdomen  Limited Ruq  03/08/2014   CLINICAL DATA:  29 year old male status post cholecystectomy on 01/28/2014. Evaluate for potential bile leak.  EXAM: US ABDOMEN LIMITED - RIGHT UPPER QUADRANT  COMPARISON:  No priors.  FINDINGS: Gallbladder:  Status post cholecystectomy. Poorly defined hypoechoic collection of material in the gallbladder fossa estimated to measure approximately 3.9 x 2.7 cm, best demonstrated on image 43.  Common bile duct:  Diameter: 4 mm.  Liver:  No focal lesion identified. Within normal limits in parenchymal echogenicity. The liver appears enlarged measuring up to 22 cm in craniocaudal span.  IMPRESSION: 1. Status post cholecystectomy with ill-defined hypoechoic collection in the region of the gallbladder fossa suspicious for either a persistent postoperative fluid collection (e.g., seroma) or potential biloma. This is incompletely imaged by ultrasound secondary to overlying shadowing, and could be better evaluated with contrast-enhanced CT scan to evaluate the full extent of this collection, if clinically appropriate. 2. Hepatomegaly. These results will be called to the ordering clinician or representative by the Radiologist Assistant, and communication documented in the PACS or zVision Dashboard.   Electronically Signed   By: Vinnie Langton M.D.   On: 03/08/2014 09:37    Assessment: 29 year old male with chronic N/V secondary to diabetic gastroparesis, poorly controlled diabetes, severe reflux esophagitis, RUQ pain s/p cholecystectomy. CT negative for bile leak. LFTs normal except for non-specific mild elevation of Tbili at 1.9. Oral thrush noted on exam; will empirically treat with Diflucan in case of candida esophagitis as well, with hopes of some improvement in constellation of symptoms. If no improvement in the next 48 hours, consider EGD with Propofol.   Plan: Diflucan X 21 days (weight based dosing of 3mg /kg daily for 21 days).  Repeat HFP in am Clear liquids as tolerated Change IV  pain medication to every 3 hours instead of 4 for 24 hours then back to every 4 hours PPI BID Reglan QID  Orvil Feil, ANP-BC [facility]     LOS: 3 days    03/09/2014, 8:19 AM  Attending note:  CT scan reviewed with Dr. Orlene Plum. Findings benign and not consistent with an ongoing biliary leak. Agree with treating for thrush/empirically for Candida esophagitis. Hopefully we will see improvement in symptoms in the next 24-48 hours.

## 2014-03-09 NOTE — Progress Notes (Signed)
ANTIBIOTIC CONSULT NOTE - FOLLOW UP  Pharmacy Consult for Unasyn Indication: intra-abdominal infection  Allergies  Allergen Reactions  . Hydrocodone Hives  . Tramadol Anaphylaxis and Other (See Comments)    Acid Reflux  . Ibuprofen Other (See Comments)    Stomach ulcers  . Naproxen Other (See Comments)    Stomach ulcers  . Sulfa Antibiotics Other (See Comments)    Stomach ulcers  . Morphine And Related Rash   Patient Measurements: Height: 6\' 1"  (185.4 cm) Weight: 180 lb (81.647 kg) IBW/kg (Calculated) : 79.9  Vital Signs: Temp: 98.3 F (36.8 C) (02/01 0422) Temp Source: Oral (02/01 0422) BP: 146/76 mmHg (02/01 0422) Pulse Rate: 116 (02/01 0422) Intake/Output from previous day: 01/31 0701 - 02/01 0700 In: 4954.2 [I.V.:2754.2; IV Piggyback:2200] Out: -  Intake/Output from this shift:    Labs:  Recent Labs  03/07/14 0621 03/08/14 0240 03/08/14 1436 03/09/14 0722  WBC 19.5* 23.5*  --  16.4*  HGB 13.2 13.2  --  12.1*  PLT 351 336  --  309  CREATININE 0.90 1.17 0.95 1.02   Estimated Creatinine Clearance: 119.7 mL/min (by C-G formula based on Cr of 1.02). No results for input(s): VANCOTROUGH, VANCOPEAK, VANCORANDOM, GENTTROUGH, GENTPEAK, GENTRANDOM, TOBRATROUGH, TOBRAPEAK, TOBRARND, AMIKACINPEAK, AMIKACINTROU, AMIKACIN in the last 72 hours.   Microbiology: No results found for this or any previous visit (from the past 720 hour(s)).  Anti-infectives    Start     Dose/Rate Route Frequency Ordered Stop   03/10/14 1000  fluconazole (DIFLUCAN) tablet 250 mg     250 mg Oral Daily 03/09/14 0909 03/30/14 0959   03/09/14 1000  fluconazole (DIFLUCAN) tablet 400 mg    Comments:  Loading dose.   400 mg Oral Daily 03/09/14 0909 03/09/14 0923   03/07/14 2000  ampicillin-sulbactam (UNASYN) 1.5 g in sodium chloride 0.9 % 50 mL IVPB     1.5 g100 mL/hr over 30 Minutes Intravenous Every 6 hours 03/07/14 1920       Assessment: 29yo male with recent cholecystectomy.  Pt with  n/v and abdominal pain.  Pt on Unasyn for intra-abdominal infection.  Diflucan has been added.  Afebrile.  WBC improving.  Goal of Therapy:  Eradicate infection.  Plan:  Continue Unasyn 1.5gm IV q6hrs Continue Diflucan per MD (anticipate 21 days Rx) Switch to PO Augmentin when appropriate Monitor labs and progress  Hart Robinsons A 03/09/2014,11:48 AM

## 2014-03-09 NOTE — Progress Notes (Signed)
Inpatient Diabetes Program Recommendations  AACE/ADA: New Consensus Statement on Inpatient Glycemic Control (2013)  Target Ranges:  Prepandial:   less than 140 mg/dL      Peak postprandial:   less than 180 mg/dL (1-2 hours)      Critically ill patients:  140 - 180 mg/dL   Results for ABHIJAY, MOSCOSO (MRN [id number]) as of 03/09/2014 07:40  Ref. Range 03/08/2014 04:22 03/08/2014 07:29 03/08/2014 11:35 03/08/2014 16:23 03/08/2014 19:42 03/09/2014 00:32 03/09/2014 04:16  Glucose-Capillary Latest Range: 70-99 mg/dL 303 (H) 144 (H) 194 (H) 325 (H) 190 (H) 272 (H) 189 (H)   Diabetes history: DM1 Outpatient Diabetes medications: Novolin 70/30 15 units QAM, 70/30 25 units QPM, Novolin R 2-10 units TID with meals Current orders for Inpatient glycemic control: Novolog 0-15 units Q4H  Inpatient Diabetes Program Recommendations Insulin - Basal: Blood glucose has fluctuated from 144-325 mg/dl over the past 24 hours.  Patient has Type 1 diabetes and requires basal insulin. Please consider ordering Lantus instead of 70/30 at this time since patient is NPO. Please order Lantus 20 units Q24H starting now. Correction (SSI): Patient has Type 1 diabetes and is sensitive to insulin. Please decrease Novolog correction to sensitive scale Q4H.  Thanks, Barnie Alderman, RN, MSN, CCRN, CDE Diabetes Coordinator Inpatient Diabetes Program [phone] (Team Pager) [phone] ([place] office) [phone] [facility] office)

## 2014-03-09 NOTE — Progress Notes (Signed)
[facility] PROGRESS NOTE  Brent Daniels [id number] DOB: [birth year] DOA: 03/06/2014 PCP: [name], MD  Assessment/Plan: 1. Intractable nausea and vomiting. Multifactorial. Likely has an element of gastroparesis and esophagitis. Continue with reglan and anti emetics. Started on clear liquids. 2. Abdominal pain. Patient had a cholecystectomy done approximately 5 weeks ago. He initially underwent HIDA scan which could not rule out an underlying bile leak. Subsequent Ultrasound of the abdomen indicated a fluid collection in the gallbladder fossa. To further characterize this finding, he underwent CT abdomen and pelvis which did not show any acute abnormalities. It was felt that the fluid collection was likely related to small bowel. 3. Diabetic gastroparesis. On reglan. Will be difficult to manage since patient is on chronic narcotics and uses cannabis 4. DM type 1. Continue sliding scale insulin. 5. Essential hypertension. Lisinopril on hold due to nausea and vomiting. Use PRN hydralazine 6. Esophagitis. Continue protonix. Concern for underlying Candida esophagitis. Patient started on fluconazole. 7. Leukocytosis. Present since 12/2013. Although there may be a chronic component to this, it is worrisome in the setting of #2. Continue antibiotics for now. 8. Metabolic acidosis. Lactic acid is normal. Suspect this is related to GI losses from vomiting. Continue bicarbonate infusion for now.  Code Status: full code Family Communication: discussed with patient Disposition Plan: discharge home once improved   Consultants:  Gastroenterology  Procedures:    Antibiotics:  unasyn 1/30  Fluconazole 2/1  HPI/Subjective: Reports that he is not feeling better today. Has continued nausea or vomiting. Still having significant abdominal pain.  Objective: Filed Vitals:   03/09/14 1541  BP: 129/71  Pulse: 113  Temp: 98.1 F (36.7 C)  Resp: 20    Intake/Output Summary (Last  24 hours) at 03/09/14 1816 Last data filed at 03/09/14 1332  Gross per 24 hour  Intake 1974.17 ml  Output      0 ml  Net 1974.17 ml   Filed Weights   03/06/14 1316  Weight: 81.647 kg (180 lb)    Exam:   General:  Sitting up beside the bed  Cardiovascular: s1, s2, tachycardic  Respiratory: CTA B  Abdomen: soft, diffusely tender, bs+  Musculoskeletal: no edema b/l   Data Reviewed: Basic Metabolic Panel:  Recent Labs Lab 03/06/14 1321 03/07/14 0621 03/08/14 0240 03/08/14 1436 03/09/14 0722  NA 135 133* 133* 134* 130*  K 3.6 3.4* 4.6 4.5 4.2  CL 97 91* 94* 104 101  CO2 27 27 15* 14* 11*  GLUCOSE 269* 254* 362* 227* 242*  BUN 16 16 16 12 11   CREATININE 0.91 0.90 1.17 0.95 1.02  CALCIUM 10.0 9.0 9.1 9.0 8.6   Liver Function Tests:  Recent Labs Lab 03/06/14 1321 03/07/14 0621 03/08/14 0240 03/09/14 0722  AST 28 17 19 14   ALT 26 21 21 18   ALKPHOS 106 99 104 88  BILITOT 0.8 1.3* 2.3* 1.9*  PROT 8.4* 7.2 7.2 6.6  ALBUMIN 4.7 4.0 4.2 3.6    Recent Labs Lab 03/06/14 1321  LIPASE 16   No results for input(s): AMMONIA in the last 168 hours. CBC:  Recent Labs Lab 03/06/14 1321 03/07/14 0621 03/08/14 0240 03/09/14 0722  WBC 13.8* 19.5* 23.5* 16.4*  NEUTROABS 11.2*  --   --   --   HGB 14.6 13.2 13.2 12.1*  HCT 43.5 39.6 40.0 37.3*  MCV 87.7 89.0 89.9 91.2  PLT 352 351 336 309   Cardiac Enzymes:  Recent Labs Lab 03/08/14 0240 03/08/14 0245 03/08/14 0809 03/08/14  Tatum  --   --   --   CKMB 1.7  --   --   --   TROPONINI  --  <0.03 <0.03 <0.03   BNP (last 3 results) No results for input(s): PROBNP in the last 8760 hours. CBG:  Recent Labs Lab 03/09/14 0032 03/09/14 0416 03/09/14 0752 03/09/14 1134 03/09/14 1618  GLUCAP 272* 189* 234* 253* 262*    No results found for this or any previous visit (from the past 240 hour(s)).   Studies: Dg Chest 1 View  03/08/2014   CLINICAL DATA:  29 year old male with mid chest pain  radiating to the right upper quadrant.  EXAM: CHEST - 1 VIEW  COMPARISON:  Chest x-ray 02/01/2014.  FINDINGS: Lung volumes are normal. No consolidative airspace disease. No pleural effusions. No pneumothorax. No pulmonary nodule or mass noted. Pulmonary vasculature and the cardiomediastinal silhouette are within normal limits.  IMPRESSION: No radiographic evidence of acute cardiopulmonary disease.   Electronically Signed   By: Vinnie Langton M.D.   On: 03/08/2014 07:29   Ct Angio Chest Pe W/cm &/or Wo Cm  03/08/2014   CLINICAL DATA:  Increasing chest pain and shortness of breath for 3 days. Recent cholecystectomy.  EXAM: CT ANGIOGRAPHY CHEST WITH CONTRAST  TECHNIQUE: Multidetector CT imaging of the chest was performed using the standard protocol during bolus administration of intravenous contrast. Multiplanar CT image reconstructions and MIPs were obtained to evaluate the vascular anatomy.  CONTRAST:  157mL OMNIPAQUE IOHEXOL 350 MG/ML SOLN  COMPARISON:  CT abdomen and pelvis 02/01/2014  FINDINGS: Technically adequate study with good opacification of the central and segmental pulmonary arteries. No focal filling defects demonstrated. No evidence of significant pulmonary embolus.  Normal heart size. Normal caliber thoracic aorta. No evidence of aortic dissection allowing for motion artifact. Great vessel origins are patent. Esophagus is decompressed. No significant lymphadenopathy in the chest.  No focal airspace disease or consolidation in the lungs. No interstitial changes. No pleural effusions. No pneumothorax. Airways appear patent.  Included portions of the upper abdominal organs demonstrate diffuse fatty infiltration of the liver. No destructive bone lesions.  Review of the MIP images confirms the above findings.  IMPRESSION: No evidence of significant pulmonary embolus. No evidence of aortic dissection. No evidence of active pulmonary disease. Diffuse fatty infiltration of the visualized liver.    Electronically Signed   By: Lucienne Capers M.D.   On: 03/08/2014 06:39   Ct Abdomen Pelvis W Contrast  03/09/2014   CLINICAL DATA:  29 year old male with history of cholecystectomy 5 weeks ago. Possible postoperative fluid collection in the gallbladder fossa noted on ultrasound examination 03/08/2014. Clinically suspected biliary leak.  EXAM: CT ABDOMEN AND PELVIS WITH CONTRAST  TECHNIQUE: Multidetector CT imaging of the abdomen and pelvis was performed using the standard protocol following bolus administration of intravenous contrast.  CONTRAST:  167mL OMNIPAQUE IOHEXOL 300 MG/ML  SOLN  COMPARISON:  CT of the abdomen and pelvis 02/01/2014. Abdominal ultrasound 03/08/2014.  FINDINGS: Lower chest:  Unremarkable.  Hepatobiliary: Diffuse low attenuation throughout the hepatic parenchyma, indicative of hepatic steatosis. No discrete cystic or solid hepatic lesions. No intra or extrahepatic biliary ductal dilatation. Status post cholecystectomy. No definite fluid collection identified within the gallbladder fossa on today's examination. No surrounding fluid or other fluid collections identified near the undersurface of the liver.  Pancreas: Unremarkable.  Spleen: Unremarkable.  Adrenals/Urinary Tract: Normal appearance of the kidneys and adrenal glands bilaterally. No hydroureteronephrosis. Urinary bladder is normal in appearance.  Stomach/Bowel: Normal appearance of the stomach. No pathologic dilatation of small bowel or colon. Normal appendix.  Vascular/Lymphatic: Mild atherosclerosis in the abdominal and pelvic vasculature, without evidence of aneurysm or dissection. No lymphadenopathy noted in the abdomen or pelvis.  Reproductive: Prostate gland and seminal vesicles are normal in appearance.  Other: Trace volume of ascites in the lower abdomen and pelvis, presumably related to recent surgery. No larger volume of ascites or focal loculated fluid collection within the peritoneal cavity in the abdomen or pelvis. No  pneumoperitoneum.  Musculoskeletal: Small amount of gas in the subcutaneous fat of the anterior abdominal wall, presumably iatrogenic in this patient with history of type 1 diabetes. There are no aggressive appearing lytic or blastic lesions noted in the visualized portions of the skeleton.  IMPRESSION: 1. Status post cholecystectomy. No unexpected postoperative fluid collection identified in the gallbladder fossa. The finding on the recent ultrasound examination was presumably a fluid-filled loop of duodenum. No suspicious fluid collections elsewhere in the abdomen or pelvis to suggests a biliary leak or biloma. 2. Low-attenuation in the hepatic parenchyma, indicative of hepatic steatosis. 3. Trace volume of ascites. 4. Additional incidental findings, as above.   Electronically Signed   By: Vinnie Langton M.D.   On: 03/09/2014 07:52   US Abdomen Limited Ruq  03/08/2014   CLINICAL DATA:  29 year old male status post cholecystectomy on 01/28/2014. Evaluate for potential bile leak.  EXAM: US ABDOMEN LIMITED - RIGHT UPPER QUADRANT  COMPARISON:  No priors.  FINDINGS: Gallbladder:  Status post cholecystectomy. Poorly defined hypoechoic collection of material in the gallbladder fossa estimated to measure approximately 3.9 x 2.7 cm, best demonstrated on image 43.  Common bile duct:  Diameter: 4 mm.  Liver:  No focal lesion identified. Within normal limits in parenchymal echogenicity. The liver appears enlarged measuring up to 22 cm in craniocaudal span.  IMPRESSION: 1. Status post cholecystectomy with ill-defined hypoechoic collection in the region of the gallbladder fossa suspicious for either a persistent postoperative fluid collection (e.g., seroma) or potential biloma. This is incompletely imaged by ultrasound secondary to overlying shadowing, and could be better evaluated with contrast-enhanced CT scan to evaluate the full extent of this collection, if clinically appropriate. 2. Hepatomegaly. These results will  be called to the ordering clinician or representative by the Radiologist Assistant, and communication documented in the PACS or zVision Dashboard.   Electronically Signed   By: Vinnie Langton M.D.   On: 03/08/2014 09:37    Scheduled Meds: . ampicillin-sulbactam (UNASYN) IV  1.5 g Intravenous Q6H  . [START ON 03/10/2014] fluconazole  250 mg Oral Daily  . insulin aspart  0-9 Units Subcutaneous 6 times per day  . insulin glargine  20 Units Subcutaneous Q lunch  . metoCLOPramide (REGLAN) injection  10 mg Intravenous 4 times per day  . pantoprazole (PROTONIX) IV  40 mg Intravenous Q12H   Continuous Infusions: . dextrose 5 % 1,000 mL with sodium bicarbonate 150 mEq infusion 125 mL/hr at 03/09/14 1011    Active Problems:   Essential hypertension   Intractable nausea and vomiting   Chronic generalized abdominal pain   Diabetic gastroparesis associated with type 1 diabetes mellitus   Hyperglycemia   Abdominal fluid collection   Abdominal pain, acute   Oropharyngeal candidiasis    Time spent: 46mins    Brylie Sneath  [facility] Pager [phone]. If 7PM-7AM, please contact night-coverage at [email or web address], password [facility] 03/09/2014, 6:16 PM  LOS: 3 days

## 2014-03-10 ENCOUNTER — Telehealth: Payer: Self-pay | Admitting: Gastroenterology

## 2014-03-10 LAB — BASIC METABOLIC PANEL
ANION GAP: 9 (ref 5–15)
BUN: 6 mg/dL (ref 6–23)
CO2: 29 mmol/L (ref 19–32)
Calcium: 8.7 mg/dL (ref 8.4–10.5)
Chloride: 96 mmol/L (ref 96–112)
Creatinine, Ser: 0.81 mg/dL (ref 0.50–1.35)
GFR calc Af Amer: >90 mL/min (ref >90)
GFR calc non Af Amer: >90 mL/min (ref >90)
Glucose, Bld: 211 mg/dL — ABNORMAL HIGH (ref 70–99)
POTASSIUM: 3 mmol/L — AB (ref 3.5–5.1)
Sodium: 134 mmol/L — ABNORMAL LOW (ref 135–145)

## 2014-03-10 LAB — CBC
HEMATOCRIT: 36.1 % — AB (ref 39.0–52.0)
HEMOGLOBIN: 12 g/dL — AB (ref 13.0–17.0)
MCH: 29.6 pg (ref 26.0–34.0)
MCHC: 33.2 g/dL (ref 30.0–36.0)
MCV: 88.9 fL (ref 78.0–100.0)
PLATELETS: 294 10*3/uL (ref 150–400)
RBC: 4.06 MIL/uL — ABNORMAL LOW (ref 4.22–5.81)
RDW: 13.2 % (ref 11.5–15.5)
WBC: 8.5 10*3/uL (ref 4.0–10.5)

## 2014-03-10 LAB — GLUCOSE, CAPILLARY
GLUCOSE-CAPILLARY: 192 mg/dL — AB (ref 70–99)
GLUCOSE-CAPILLARY: 201 mg/dL — AB (ref 70–99)
Glucose-Capillary: 200 mg/dL — ABNORMAL HIGH (ref 70–99)
Glucose-Capillary: 265 mg/dL — ABNORMAL HIGH (ref 70–99)

## 2014-03-10 MED ORDER — METOCLOPRAMIDE HCL 10 MG PO TABS
10.0000 mg | ORAL_TABLET | Freq: Three times a day (TID) | ORAL | Status: DC
  Administered 2014-03-10 (×2): 10 mg via ORAL
  Filled 2014-03-10 (×2): qty 1

## 2014-03-10 MED ORDER — POTASSIUM CHLORIDE CRYS ER 20 MEQ PO TBCR
40.0000 meq | EXTENDED_RELEASE_TABLET | Freq: Once | ORAL | Status: AC
Start: 2014-03-10 — End: 2014-03-10
  Administered 2014-03-10: 40 meq via ORAL
  Filled 2014-03-10: qty 2

## 2014-03-10 MED ORDER — OXYCODONE-ACETAMINOPHEN 7.5-325 MG PO TABS: 1.0000 | ORAL_TABLET | ORAL | Status: DC | PRN

## 2014-03-10 MED ORDER — FLUCONAZOLE 50 MG PO TABS: 250.0000 mg | ORAL_TABLET | Freq: Every day | ORAL | Status: DC

## 2014-03-10 MED ORDER — METOCLOPRAMIDE HCL 10 MG PO TABS: 10.0000 mg | ORAL_TABLET | Freq: Three times a day (TID) | ORAL | Status: DC

## 2014-03-10 MED ORDER — PANTOPRAZOLE SODIUM 40 MG PO TBEC
40.0000 mg | DELAYED_RELEASE_TABLET | Freq: Two times a day (BID) | ORAL | Status: DC
  Administered 2014-03-10 (×2): 40 mg via ORAL
  Filled 2014-03-10 (×2): qty 1

## 2014-03-10 NOTE — Telephone Encounter (Signed)
Please cancel appt on 2/4 and reschedule for several weeks from now, hospital follow-up.

## 2014-03-10 NOTE — Progress Notes (Signed)
Inpatient Diabetes Program Recommendations  AACE/ADA: New Consensus Statement on Inpatient Glycemic Control (2013)  Target Ranges:  Prepandial:   less than 140 mg/dL      Peak postprandial:   less than 180 mg/dL (1-2 hours)      Critically ill patients:  140 - 180 mg/dL   Results for LANDRY, SCHWINN (MRN [id number]) as of 03/10/2014 07:53  Ref. Range 03/09/2014 04:16 03/09/2014 07:52 03/09/2014 11:34 03/09/2014 16:18 03/09/2014 20:07 03/09/2014 23:51 03/10/2014 04:07 03/10/2014 07:35  Glucose-Capillary Latest Range: 70-99 mg/dL 189 (H) 234 (H) 253 (H) 262 (H) 246 (H) 191 (H) 192 (H) 201 (H)   Diabetes history: DM1 Outpatient Diabetes medications: Novolin 70/30 15 units QAM, 70/30 25 units QPM, Novolin R 2-10 units TID with meals Current orders for Inpatient glycemic control: Lantus 20 units daily with lunch, Novolog 0-9 units Q4H  Inpatient Diabetes Program Recommendations Insulin - Basal: Please consider increasing Lantus to 25 units daily with lunch. Correction (SSI): If diet is advanced and patient eating well, please consider changing frequency to ACHS. Insulin - Meal Coverage: If diet is advanced and patient tolerating well, please consider ordering Novolog 3 units TID with meals for meal coverage.  Thanks, Barnie Alderman, RN, MSN, CCRN, CDE Diabetes Coordinator Inpatient Diabetes Program [phone] (Team Pager) [phone] ([facility] office) [phone] [facility] office)

## 2014-03-10 NOTE — Progress Notes (Signed)
Subjective: Nausea but no vomiting. Wants to go home. Abdominal pain improved slightly. States abdominal pain worsens nausea.   Objective: Vital signs in last 24 hours: Temp:  [98 F (36.7 C)-98.9 F (37.2 C)] 98 F (36.7 C) (02/02 0404) Pulse Rate:  [90-119] 90 (02/02 0404) Resp:  [20] 20 (02/02 0404) BP: (129-156)/(70-87) 148/87 mmHg (02/02 0404) SpO2:  [98 %-100 %] 100 % (02/02 0404) Last BM Date: 03/06/14 General:   Alert and oriented, pleasant Head:  Normocephalic and atraumatic. Abdomen:  Bowel sounds present, soft,TTP diffusely, moreso epigastric/RUQ Extremities:  Without  edema. Neurologic:  Alert and  oriented x4;  grossly normal neurologically. Psych:  Alert and cooperative. Flat affect  Intake/Output from previous day: 02/01 0701 - 02/02 0700 In: 240 [P.O.:240] Out: -  Intake/Output this shift:    Lab Results:  Recent Labs  03/08/14 0240 03/09/14 0722 03/10/14 0834  WBC 23.5* 16.4* 8.5  HGB 13.2 12.1* 12.0*  HCT 40.0 37.3* 36.1*  PLT 336 309 294   BMET  Recent Labs  03/08/14 1436 03/09/14 0722 03/10/14 0834  NA 134* 130* 134*  K 4.5 4.2 3.0*  CL 104 101 96  CO2 14* 11* 29  GLUCOSE 227* 242* 211*  BUN 12 11 6   CREATININE 0.95 1.02 0.81  CALCIUM 9.0 8.6 8.7   LFT  Recent Labs  03/08/14 0240 03/09/14 0722  PROT 7.2 6.6  ALBUMIN 4.2 3.6  AST 19 14  ALT 21 18  ALKPHOS 104 88  BILITOT 2.3* 1.9*     Studies/Results: Ct Abdomen Pelvis W Contrast  03/09/2014   CLINICAL DATA:  29 year old male with history of cholecystectomy 5 weeks ago. Possible postoperative fluid collection in the gallbladder fossa noted on ultrasound examination 03/08/2014. Clinically suspected biliary leak.  EXAM: CT ABDOMEN AND PELVIS WITH CONTRAST  TECHNIQUE: Multidetector CT imaging of the abdomen and pelvis was performed using the standard protocol following bolus administration of intravenous contrast.  CONTRAST:  1106mL OMNIPAQUE IOHEXOL 300 MG/ML  SOLN   COMPARISON:  CT of the abdomen and pelvis 02/01/2014. Abdominal ultrasound 03/08/2014.  FINDINGS: Lower chest:  Unremarkable.  Hepatobiliary: Diffuse low attenuation throughout the hepatic parenchyma, indicative of hepatic steatosis. No discrete cystic or solid hepatic lesions. No intra or extrahepatic biliary ductal dilatation. Status post cholecystectomy. No definite fluid collection identified within the gallbladder fossa on today's examination. No surrounding fluid or other fluid collections identified near the undersurface of the liver.  Pancreas: Unremarkable.  Spleen: Unremarkable.  Adrenals/Urinary Tract: Normal appearance of the kidneys and adrenal glands bilaterally. No hydroureteronephrosis. Urinary bladder is normal in appearance.  Stomach/Bowel: Normal appearance of the stomach. No pathologic dilatation of small bowel or colon. Normal appendix.  Vascular/Lymphatic: Mild atherosclerosis in the abdominal and pelvic vasculature, without evidence of aneurysm or dissection. No lymphadenopathy noted in the abdomen or pelvis.  Reproductive: Prostate gland and seminal vesicles are normal in appearance.  Other: Trace volume of ascites in the lower abdomen and pelvis, presumably related to recent surgery. No larger volume of ascites or focal loculated fluid collection within the peritoneal cavity in the abdomen or pelvis. No pneumoperitoneum.  Musculoskeletal: Small amount of gas in the subcutaneous fat of the anterior abdominal wall, presumably iatrogenic in this patient with history of type 1 diabetes. There are no aggressive appearing lytic or blastic lesions noted in the visualized portions of the skeleton.  IMPRESSION: 1. Status post cholecystectomy. No unexpected postoperative fluid collection identified in the gallbladder fossa. The finding on the  recent ultrasound examination was presumably a fluid-filled loop of duodenum. No suspicious fluid collections elsewhere in the abdomen or pelvis to suggests a  biliary leak or biloma. 2. Low-attenuation in the hepatic parenchyma, indicative of hepatic steatosis. 3. Trace volume of ascites. 4. Additional incidental findings, as above.   Electronically Signed   By: Vinnie Langton M.D.   On: 03/09/2014 07:52    Assessment: 29 year old male with chronic N/V secondary to diabetic gastroparesis, poorly controlled diabetes, severe reflux esophagitis, RUQ pain s/p cholecystectomy. CT negative for bile leak. LFTs normal except for non-specific mild elevation of Tbili at 1.9. Treatment for oral thrush and possible candida esophagitis empirically with Diflucan weight-based dosing. Clinically, patient notes improvement with abdominal pain and nausea today, asking to go home. Tolerating small amounts of clear liquids but desires a soft diet. Will advance to soft diet. If able to tolerate, hopeful discharge in near future. Would benefit from pain management referral versus tertiary care facility evaluation.   Plan: Diflucan for 21 days total Advance to soft diet PPI BID Reglan QID    Orvil Feil, ANP-BC [facility]     LOS: 4 days    03/10/2014, 9:47 AM

## 2014-03-10 NOTE — Progress Notes (Signed)
AVS reviewed with patient. IVs' removed and site with in normal limits. Verbalized understanding of discharge instructions, medications, and physician follow up. Prescriptions given to patient. Patient walked to Emergency Room Entrance by nurse. Stable at time of discharge.

## 2014-03-11 ENCOUNTER — Encounter: Payer: Self-pay | Admitting: Internal Medicine

## 2014-03-11 NOTE — Telephone Encounter (Signed)
APPOINTMENT MOVED TO MARCH AND LETTER SENT

## 2014-03-11 NOTE — Discharge Summary (Signed)
Physician Discharge Summary  Brent Daniels [id number] DOB: [birth year] DOA: 03/06/2014  PCP: [name], MD  Admit date: 03/06/2014 Discharge date: 03/10/2014  Time spent: 40 minutes  Recommendations for Outpatient Follow-up:  1. Follow up with primary care physician in 1-2 weeks 2. Follow up with pain management clinic for abdominal pain  Discharge Diagnoses:  Active Problems:   Essential hypertension   Intractable nausea and vomiting   Chronic generalized abdominal pain   Diabetic gastroparesis associated with type 1 diabetes mellitus   Hyperglycemia   Abdominal fluid collection   Abdominal pain, acute   Oropharyngeal candidiasis   Metabolic acidosis   Discharge Condition: improved  Diet recommendation: low carb, low salt  Filed Weights   03/06/14 1316  Weight: 81.647 kg (180 lb)    History of present illness:  This patient was admitted to the hospital with acute on chronic abdominal pain and intractable nausea and vomiting.  Hospital Course:  This patient was admitted to the hospital with abdominal pain and vomiting. He was treated supportively with IV fluids, antiemetics. He was seen by GI and underwent initial imaging of abdomen with HIDA scan which initially showed a potential bile leak. This was further evaluated with CT abdomen which did not show any acute findings. It was noted that he did have oropharyngeal candidiasis and was started on fluconazole. His diet was slowly advanced and he is now tolerating a solid diet. He did develop metabolic acidosis due to his persistent vomiting, but that has resolved after bicarbonate infusion. Patient is feeling significantly improved today and is requesting discharge home. He will follow up with GI as an outpatient.  Procedures:    Consultations:  gastroenterology  Discharge Exam: Filed Vitals:   03/10/14 1342  BP: 145/90  Pulse: 99  Temp: 98.2 F (36.8 C)  Resp: 20    General: NAD Cardiovascular: s1,  s2, rrr Respiratory: cta b  Discharge Instructions   Discharge Instructions    Call MD for:  persistant nausea and vomiting    Complete by:  As directed      Call MD for:  severe uncontrolled pain    Complete by:  As directed      Call MD for:  temperature >100.4    Complete by:  As directed      Diet - low sodium heart healthy    Complete by:  As directed      Diet Carb Modified    Complete by:  As directed      Increase activity slowly    Complete by:  As directed           Discharge Medication List as of 03/10/2014  5:17 PM    START taking these medications   Details  fluconazole (DIFLUCAN) 50 MG tablet Take 5 tablets (250 mg total) by mouth daily., Starting 03/10/2014, Until Discontinued, Print      CONTINUE these medications which have CHANGED   Details  metoCLOPramide (REGLAN) 10 MG tablet Take 1 tablet (10 mg total) by mouth 4 (four) times daily -  before meals and at bedtime., Starting 03/10/2014, Until Discontinued, Print    oxyCODONE-acetaminophen (PERCOCET) 7.5-325 MG per tablet Take 1 tablet by mouth every 4 (four) hours as needed for pain., Starting 03/10/2014, Until Discontinued, Print      CONTINUE these medications which have NOT CHANGED   Details  gabapentin (NEURONTIN) 800 MG tablet Take 800 mg by mouth 3 (three) times daily., Until Discontinued, Historical Med  insulin NPH-regular Human (NOVOLIN 70/30) (70-30) 100 UNIT/ML injection Inject 20 Units into the skin 2 (two) times daily with a meal. 15 units in the morning and 25 at night., Starting 11/17/2013, Until Discontinued, Normal    insulin regular (NOVOLIN R,HUMULIN R) 100 units/mL injection Inject 2-10 Units into the skin 3 (three) times daily before meals. Sliding scale., Until Discontinued, Historical Med    lisinopril (PRINIVIL,ZESTRIL) 20 MG tablet Take 20 mg by mouth daily., Until Discontinued, Historical Med    ondansetron (ZOFRAN) 4 MG tablet Take 1 tablet (4 mg total) by mouth 3 (three) times  daily with meals., Starting 12/09/2013, Until Discontinued, Normal    pantoprazole (PROTONIX) 40 MG tablet Take 1 tablet (40 mg total) by mouth 2 (two) times daily before a meal., Starting 10/01/2013, Until Discontinued, Print    pravastatin (PRAVACHOL) 20 MG tablet Take 20 mg by mouth daily. , Starting 09/10/2013, Until Thu 09/10/14, Historical Med    promethazine (PHENERGAN) 25 MG tablet Take 1 tablet (25 mg total) by mouth every 6 (six) hours as needed for nausea or vomiting., Starting 12/09/2013, Until Discontinued, Normal    tiZANidine (ZANAFLEX) 4 MG tablet Take 1 tablet by mouth every 6 (six) hours as needed for muscle spasms. , Starting 07/30/2013, Until Discontinued, Historical Med    nicotine (NICODERM CQ - DOSED IN MG/24 HOURS) 14 mg/24hr patch Place 1 patch (14 mg total) onto the skin daily., Starting 02/02/2014, Until Discontinued, Normal      STOP taking these medications     sucralfate (CARAFATE) 1 GM/10ML suspension        Allergies  Allergen Reactions  . Hydrocodone Hives  . Tramadol Anaphylaxis and Other (See Comments)    Acid Reflux  . Ibuprofen Other (See Comments)    Stomach ulcers  . Naproxen Other (See Comments)    Stomach ulcers  . Sulfa Antibiotics Other (See Comments)    Stomach ulcers  . Morphine And Related Rash   Follow-up Information    Follow up with Manus Rudd, MD. Schedule an appointment as soon as possible for a visit in 2 weeks.   Specialty:  Gastroenterology   Contact information:   [address] [place] [address] [phone]        The results of significant diagnostics from this hospitalization (including imaging, microbiology, ancillary and laboratory) are listed below for reference.    Significant Diagnostic Studies: Dg Chest 1 View  03/08/2014   CLINICAL DATA:  29 year old male with mid chest pain radiating to the right upper quadrant.  EXAM: CHEST - 1 VIEW  COMPARISON:  Chest x-ray 02/01/2014.  FINDINGS: Lung volumes are  normal. No consolidative airspace disease. No pleural effusions. No pneumothorax. No pulmonary nodule or mass noted. Pulmonary vasculature and the cardiomediastinal silhouette are within normal limits.  IMPRESSION: No radiographic evidence of acute cardiopulmonary disease.   Electronically Signed   By: Vinnie Langton M.D.   On: 03/08/2014 07:29   Ct Angio Chest Pe W/cm &/or Wo Cm  03/08/2014   CLINICAL DATA:  Increasing chest pain and shortness of breath for 3 days. Recent cholecystectomy.  EXAM: CT ANGIOGRAPHY CHEST WITH CONTRAST  TECHNIQUE: Multidetector CT imaging of the chest was performed using the standard protocol during bolus administration of intravenous contrast. Multiplanar CT image reconstructions and MIPs were obtained to evaluate the vascular anatomy.  CONTRAST:  169mL OMNIPAQUE IOHEXOL 350 MG/ML SOLN  COMPARISON:  CT abdomen and pelvis 02/01/2014  FINDINGS: Technically adequate study with good opacification of the  central and segmental pulmonary arteries. No focal filling defects demonstrated. No evidence of significant pulmonary embolus.  Normal heart size. Normal caliber thoracic aorta. No evidence of aortic dissection allowing for motion artifact. Great vessel origins are patent. Esophagus is decompressed. No significant lymphadenopathy in the chest.  No focal airspace disease or consolidation in the lungs. No interstitial changes. No pleural effusions. No pneumothorax. Airways appear patent.  Included portions of the upper abdominal organs demonstrate diffuse fatty infiltration of the liver. No destructive bone lesions.  Review of the MIP images confirms the above findings.  IMPRESSION: No evidence of significant pulmonary embolus. No evidence of aortic dissection. No evidence of active pulmonary disease. Diffuse fatty infiltration of the visualized liver.   Electronically Signed   By: Lucienne Capers M.D.   On: 03/08/2014 06:39   Nm Hepatobiliary Liver Func  03/07/2014   CLINICAL DATA:   Tc Choletec 5 mCi in Right wrist. Patient vomiting and in severe abdominal pain. Patient refused to finish the scan due to level of pain and vomiting. Radiologist called and consulted before leaving the department. Patient unable to make it 30 mins and insisted on being able to go back to his room.  EXAM: NUCLEAR MEDICINE HEPATOBILIARY IMAGING  TECHNIQUE: Sequential images of the abdomen were obtained, acquired for less than 30 min as detailed above, following intravenous administration of radiopharmaceutical.  RADIOPHARMACEUTICALS:  5.0 [place] 123XX123 Choletec  COMPARISON:  CT, 02/01/2014  FINDINGS: There is homogeneous accumulation of radiotracer by the liver. During short span of this exam, no activity was seen in the intra or extrahepatic biliary tree.  Final image shows some hazy activity proceeding medially from the porta hepatis region of the liver which suggests, but is not conclusive for, a biliary leak.  IMPRESSION: 1. Exam incomplete. Patient could not tolerate the full study. The early findings do suggest a biliary leak, with some subtle on dispersing medially from the porta hepatis of the liver, and show no evidence of normal activity within the intrahepatic or extrahepatic biliary tree.   Electronically Signed   By: Lajean Manes M.D.   On: 03/07/2014 16:28   Ct Abdomen Pelvis W Contrast  03/09/2014   CLINICAL DATA:  29 year old male with history of cholecystectomy 5 weeks ago. Possible postoperative fluid collection in the gallbladder fossa noted on ultrasound examination 03/08/2014. Clinically suspected biliary leak.  EXAM: CT ABDOMEN AND PELVIS WITH CONTRAST  TECHNIQUE: Multidetector CT imaging of the abdomen and pelvis was performed using the standard protocol following bolus administration of intravenous contrast.  CONTRAST:  156mL OMNIPAQUE IOHEXOL 300 MG/ML  SOLN  COMPARISON:  CT of the abdomen and pelvis 02/01/2014. Abdominal ultrasound 03/08/2014.  FINDINGS: Lower chest:  Unremarkable.   Hepatobiliary: Diffuse low attenuation throughout the hepatic parenchyma, indicative of hepatic steatosis. No discrete cystic or solid hepatic lesions. No intra or extrahepatic biliary ductal dilatation. Status post cholecystectomy. No definite fluid collection identified within the gallbladder fossa on today's examination. No surrounding fluid or other fluid collections identified near the undersurface of the liver.  Pancreas: Unremarkable.  Spleen: Unremarkable.  Adrenals/Urinary Tract: Normal appearance of the kidneys and adrenal glands bilaterally. No hydroureteronephrosis. Urinary bladder is normal in appearance.  Stomach/Bowel: Normal appearance of the stomach. No pathologic dilatation of small bowel or colon. Normal appendix.  Vascular/Lymphatic: Mild atherosclerosis in the abdominal and pelvic vasculature, without evidence of aneurysm or dissection. No lymphadenopathy noted in the abdomen or pelvis.  Reproductive: Prostate gland and seminal vesicles are normal in appearance.  Other: Trace volume of ascites in the lower abdomen and pelvis, presumably related to recent surgery. No larger volume of ascites or focal loculated fluid collection within the peritoneal cavity in the abdomen or pelvis. No pneumoperitoneum.  Musculoskeletal: Small amount of gas in the subcutaneous fat of the anterior abdominal wall, presumably iatrogenic in this patient with history of type 1 diabetes. There are no aggressive appearing lytic or blastic lesions noted in the visualized portions of the skeleton.  IMPRESSION: 1. Status post cholecystectomy. No unexpected postoperative fluid collection identified in the gallbladder fossa. The finding on the recent ultrasound examination was presumably a fluid-filled loop of duodenum. No suspicious fluid collections elsewhere in the abdomen or pelvis to suggests a biliary leak or biloma. 2. Low-attenuation in the hepatic parenchyma, indicative of hepatic steatosis. 3. Trace volume of  ascites. 4. Additional incidental findings, as above.   Electronically Signed   By: Vinnie Langton M.D.   On: 03/09/2014 07:52   Dg Abd 2 Views  03/06/2014   CLINICAL DATA:  Generalized abdominal pain, back pain, vomiting  EXAM: ABDOMEN - 2 VIEW  COMPARISON:  None.  FINDINGS: There is nonspecific nonobstructive bowel gas pattern. No free abdominal air is noted. Postcholecystectomy surgical clips are noted.  IMPRESSION: Negative.   Electronically Signed   By: Lahoma Crocker M.D.   On: 03/06/2014 14:30   US Abdomen Limited Ruq  03/08/2014   CLINICAL DATA:  29 year old male status post cholecystectomy on 01/28/2014. Evaluate for potential bile leak.  EXAM: US ABDOMEN LIMITED - RIGHT UPPER QUADRANT  COMPARISON:  No priors.  FINDINGS: Gallbladder:  Status post cholecystectomy. Poorly defined hypoechoic collection of material in the gallbladder fossa estimated to measure approximately 3.9 x 2.7 cm, best demonstrated on image 43.  Common bile duct:  Diameter: 4 mm.  Liver:  No focal lesion identified. Within normal limits in parenchymal echogenicity. The liver appears enlarged measuring up to 22 cm in craniocaudal span.  IMPRESSION: 1. Status post cholecystectomy with ill-defined hypoechoic collection in the region of the gallbladder fossa suspicious for either a persistent postoperative fluid collection (e.g., seroma) or potential biloma. This is incompletely imaged by ultrasound secondary to overlying shadowing, and could be better evaluated with contrast-enhanced CT scan to evaluate the full extent of this collection, if clinically appropriate. 2. Hepatomegaly. These results will be called to the ordering clinician or representative by the Radiologist Assistant, and communication documented in the PACS or zVision Dashboard.   Electronically Signed   By: Vinnie Langton M.D.   On: 03/08/2014 09:37    Microbiology: No results found for this or any previous visit (from the past 240 hour(s)).   Labs: Basic  Metabolic Panel:  Recent Labs Lab 03/07/14 0621 03/08/14 0240 03/08/14 1436 03/09/14 0722 03/10/14 0834  NA 133* 133* 134* 130* 134*  K 3.4* 4.6 4.5 4.2 3.0*  CL 91* 94* 104 101 96  CO2 27 15* 14* 11* 29  GLUCOSE 254* 362* 227* 242* 211*  BUN 16 16 12 11 6   CREATININE 0.90 1.17 0.95 1.02 0.81  CALCIUM 9.0 9.1 9.0 8.6 8.7   Liver Function Tests:  Recent Labs Lab 03/06/14 1321 03/07/14 0621 03/08/14 0240 03/09/14 0722  AST 28 17 19 14   ALT 26 21 21 18   ALKPHOS 106 99 104 88  BILITOT 0.8 1.3* 2.3* 1.9*  PROT 8.4* 7.2 7.2 6.6  ALBUMIN 4.7 4.0 4.2 3.6    Recent Labs Lab 03/06/14 1321  LIPASE 16   No results  for input(s): AMMONIA in the last 168 hours. CBC:  Recent Labs Lab 03/06/14 1321 03/07/14 0621 03/08/14 0240 03/09/14 0722 03/10/14 0834  WBC 13.8* 19.5* 23.5* 16.4* 8.5  NEUTROABS 11.2*  --   --   --   --   HGB 14.6 13.2 13.2 12.1* 12.0*  HCT 43.5 39.6 40.0 37.3* 36.1*  MCV 87.7 89.0 89.9 91.2 88.9  PLT 352 351 336 309 294   Cardiac Enzymes:  Recent Labs Lab 03/08/14 0240 03/08/14 0245 03/08/14 0809 03/08/14 1436  CKTOTAL 48  --   --   --   CKMB 1.7  --   --   --   TROPONINI  --  <0.03 <0.03 <0.03   BNP: BNP (last 3 results) No results for input(s): BNP in the last 8760 hours.  ProBNP (last 3 results) No results for input(s): PROBNP in the last 8760 hours.  CBG:  Recent Labs Lab 03/09/14 2351 03/10/14 0407 03/10/14 0735 03/10/14 1123 03/10/14 1706  GLUCAP 191* 192* 201* 200* 265*       Signed:  MEMON,JEHANZEB  [facility] 03/11/2014, 12:17 AM

## 2014-03-12 ENCOUNTER — Ambulatory Visit: Payer: Managed Care, Other (non HMO) | Admitting: Gastroenterology

## 2014-03-12 NOTE — Progress Notes (Signed)
UR chart review completed.  

## 2014-03-19 ENCOUNTER — Emergency Department (HOSPITAL_COMMUNITY)
Admission: EM | Admit: 2014-03-19 | Discharge: 2014-03-19 | Disposition: A | Discharge status: Home / Self Care | Payer: Managed Care, Other (non HMO) | Attending: Emergency Medicine | Admitting: Emergency Medicine

## 2014-03-19 ENCOUNTER — Emergency Department (HOSPITAL_COMMUNITY): Payer: Managed Care, Other (non HMO)

## 2014-03-19 ENCOUNTER — Encounter (HOSPITAL_COMMUNITY): Payer: Self-pay | Admitting: Emergency Medicine

## 2014-03-19 DIAGNOSIS — Z9089 Acquired absence of other organs: Secondary | ICD-10-CM | POA: Insufficient documentation

## 2014-03-19 DIAGNOSIS — G8929 Other chronic pain: Secondary | ICD-10-CM | POA: Insufficient documentation

## 2014-03-19 DIAGNOSIS — K219 Gastro-esophageal reflux disease without esophagitis: Secondary | ICD-10-CM | POA: Diagnosis not present

## 2014-03-19 DIAGNOSIS — Z79899 Other long term (current) drug therapy: Secondary | ICD-10-CM | POA: Insufficient documentation

## 2014-03-19 DIAGNOSIS — R011 Cardiac murmur, unspecified: Secondary | ICD-10-CM | POA: Diagnosis not present

## 2014-03-19 DIAGNOSIS — Z720 Tobacco use: Secondary | ICD-10-CM | POA: Diagnosis not present

## 2014-03-19 DIAGNOSIS — K5900 Constipation, unspecified: Secondary | ICD-10-CM

## 2014-03-19 DIAGNOSIS — Z8711 Personal history of peptic ulcer disease: Secondary | ICD-10-CM | POA: Insufficient documentation

## 2014-03-19 DIAGNOSIS — I10 Essential (primary) hypertension: Secondary | ICD-10-CM | POA: Diagnosis not present

## 2014-03-19 DIAGNOSIS — F419 Anxiety disorder, unspecified: Secondary | ICD-10-CM | POA: Insufficient documentation

## 2014-03-19 DIAGNOSIS — E109 Type 1 diabetes mellitus without complications: Secondary | ICD-10-CM | POA: Insufficient documentation

## 2014-03-19 DIAGNOSIS — R1013 Epigastric pain: Secondary | ICD-10-CM

## 2014-03-19 DIAGNOSIS — Z8639 Personal history of other endocrine, nutritional and metabolic disease: Secondary | ICD-10-CM | POA: Diagnosis not present

## 2014-03-19 DIAGNOSIS — Z794 Long term (current) use of insulin: Secondary | ICD-10-CM | POA: Diagnosis not present

## 2014-03-19 DIAGNOSIS — Z9889 Other specified postprocedural states: Secondary | ICD-10-CM | POA: Diagnosis not present

## 2014-03-19 DIAGNOSIS — R112 Nausea with vomiting, unspecified: Secondary | ICD-10-CM | POA: Diagnosis not present

## 2014-03-19 DIAGNOSIS — E780 Pure hypercholesterolemia: Secondary | ICD-10-CM | POA: Diagnosis not present

## 2014-03-19 DIAGNOSIS — G629 Polyneuropathy, unspecified: Secondary | ICD-10-CM | POA: Insufficient documentation

## 2014-03-19 LAB — URINALYSIS, ROUTINE W REFLEX MICROSCOPIC
BILIRUBIN URINE: NEGATIVE
Hgb urine dipstick: NEGATIVE
Ketones, ur: NEGATIVE mg/dL
Leukocytes, UA: NEGATIVE
Nitrite: NEGATIVE
PH: 6 (ref 5.0–8.0)
Protein, ur: NEGATIVE mg/dL
SPECIFIC GRAVITY, URINE: 1.015 (ref 1.005–1.030)
Urobilinogen, UA: 0.2 mg/dL (ref 0.0–1.0)

## 2014-03-19 LAB — CBC WITH DIFFERENTIAL/PLATELET
BASOS ABS: 0.1 10*3/uL (ref 0.0–0.1)
BASOS PCT: 1 % (ref 0–1)
Eosinophils Absolute: 0.4 10*3/uL (ref 0.0–0.7)
Eosinophils Relative: 3 % (ref 0–5)
HCT: 35.6 % — ABNORMAL LOW (ref 39.0–52.0)
Hemoglobin: 11.6 g/dL — ABNORMAL LOW (ref 13.0–17.0)
LYMPHS ABS: 4.2 10*3/uL — AB (ref 0.7–4.0)
LYMPHS PCT: 35 % (ref 12–46)
MCH: 29.1 pg (ref 26.0–34.0)
MCHC: 32.6 g/dL (ref 30.0–36.0)
MCV: 89.2 fL (ref 78.0–100.0)
MONO ABS: 1.3 10*3/uL — AB (ref 0.1–1.0)
Monocytes Relative: 11 % (ref 3–12)
NEUTROS ABS: 6 10*3/uL (ref 1.7–7.7)
Neutrophils Relative %: 50 % (ref 43–77)
PLATELETS: 339 10*3/uL (ref 150–400)
RBC: 3.99 MIL/uL — AB (ref 4.22–5.81)
RDW: 13.9 % (ref 11.5–15.5)
WBC: 12 10*3/uL — AB (ref 4.0–10.5)

## 2014-03-19 LAB — COMPREHENSIVE METABOLIC PANEL
ALT: 21 U/L (ref 0–53)
ANION GAP: 8 (ref 5–15)
AST: 23 U/L (ref 0–37)
Albumin: 3.8 g/dL (ref 3.5–5.2)
Alkaline Phosphatase: 86 U/L (ref 39–117)
BUN: 11 mg/dL (ref 6–23)
CALCIUM: 9.4 mg/dL (ref 8.4–10.5)
CO2: 30 mmol/L (ref 19–32)
CREATININE: 0.7 mg/dL (ref 0.50–1.35)
Chloride: 99 mmol/L (ref 96–112)
GFR calc Af Amer: >90 mL/min (ref >90)
Glucose, Bld: 175 mg/dL — ABNORMAL HIGH (ref 70–99)
Potassium: 3.1 mmol/L — ABNORMAL LOW (ref 3.5–5.1)
SODIUM: 137 mmol/L (ref 135–145)
TOTAL PROTEIN: 6.8 g/dL (ref 6.0–8.3)
Total Bilirubin: 0.4 mg/dL (ref 0.3–1.2)

## 2014-03-19 LAB — I-STAT CG4 LACTIC ACID, ED: LACTIC ACID, VENOUS: 0.85 mmol/L (ref 0.5–2.0)

## 2014-03-19 LAB — CBG MONITORING, ED
Glucose-Capillary: 153 mg/dL — ABNORMAL HIGH (ref 70–99)
Glucose-Capillary: 131 mg/dL — ABNORMAL HIGH (ref 70–99)

## 2014-03-19 LAB — RAPID URINE DRUG SCREEN, HOSP PERFORMED
AMPHETAMINES: NOT DETECTED
BENZODIAZEPINES: NOT DETECTED
Barbiturates: NOT DETECTED
Cocaine: NOT DETECTED
Opiates: NOT DETECTED
Tetrahydrocannabinol: POSITIVE — AB

## 2014-03-19 LAB — URINE MICROSCOPIC-ADD ON

## 2014-03-19 LAB — LIPASE, BLOOD: LIPASE: 23 U/L (ref 11–59)

## 2014-03-19 MED ORDER — PANTOPRAZOLE SODIUM 40 MG IV SOLR
40.0000 mg | Freq: Once | INTRAVENOUS | Status: AC
  Administered 2014-03-19: 40 mg via INTRAVENOUS
  Filled 2014-03-19: qty 40

## 2014-03-19 MED ORDER — POTASSIUM CHLORIDE 10 MEQ/100ML IV SOLN
10.0000 meq | INTRAVENOUS | Status: AC
  Administered 2014-03-19 (×2): 10 meq via INTRAVENOUS
  Filled 2014-03-19 (×2): qty 100

## 2014-03-19 MED ORDER — POTASSIUM CHLORIDE CRYS ER 20 MEQ PO TBCR
EXTENDED_RELEASE_TABLET | ORAL | Status: AC
  Filled 2014-03-19: qty 1

## 2014-03-19 MED ORDER — METOCLOPRAMIDE HCL 5 MG/ML IJ SOLN
10.0000 mg | Freq: Once | INTRAMUSCULAR | Status: AC
  Administered 2014-03-19: 10 mg via INTRAVENOUS
  Filled 2014-03-19: qty 2

## 2014-03-19 MED ORDER — HYDROMORPHONE HCL 1 MG/ML IJ SOLN
1.0000 mg | Freq: Once | INTRAMUSCULAR | Status: AC
  Administered 2014-03-19: 1 mg via INTRAVENOUS
  Filled 2014-03-19: qty 1

## 2014-03-19 MED ORDER — SODIUM CHLORIDE 0.9 % IV BOLUS (SEPSIS)
1000.0000 mL | Freq: Once | INTRAVENOUS | Status: AC
  Administered 2014-03-19: 1000 mL via INTRAVENOUS

## 2014-03-19 MED ORDER — POTASSIUM CHLORIDE CRYS ER 20 MEQ PO TBCR
40.0000 meq | EXTENDED_RELEASE_TABLET | Freq: Once | ORAL | Status: AC
  Administered 2014-03-19: 40 meq via ORAL
  Filled 2014-03-19: qty 2

## 2014-03-19 MED ORDER — GI COCKTAIL ~~LOC~~
30.0000 mL | Freq: Once | ORAL | Status: AC
  Administered 2014-03-19: 30 mL via ORAL
  Filled 2014-03-19: qty 30

## 2014-03-19 MED ORDER — ONDANSETRON HCL 4 MG/2ML IJ SOLN
4.0000 mg | Freq: Once | INTRAMUSCULAR | Status: AC
  Administered 2014-03-19: 4 mg via INTRAVENOUS
  Filled 2014-03-19: qty 2

## 2014-03-19 MED ORDER — POLYETHYLENE GLYCOL 3350 17 G PO PACK: 17.0000 g | PACK | Freq: Every day | ORAL | Status: DC

## 2014-03-19 MED ORDER — OXYCODONE-ACETAMINOPHEN 5-325 MG PO TABS: 2.0000 | ORAL_TABLET | ORAL | Status: DC | PRN

## 2014-03-19 NOTE — ED Notes (Signed)
Pt given diet sprite and water. Pt tolerating well. Pt denies nausea. nad noted.

## 2014-03-19 NOTE — ED Notes (Signed)
Potassium infusion paused prior to dilaudid admin. Line flushed with normal saline and potassium infusion restarted.nad noted. Pt tolerated well.

## 2014-03-19 NOTE — ED Notes (Signed)
EDP aware of pt CBG. EDP reported for pt to be discharged once potassium infuses.

## 2014-03-19 NOTE — ED Provider Notes (Signed)
CSN: [id number]     Arrival date & time 03/19/14  [id number] History   First MD Initiated Contact with Patient 03/19/14 [phone]     Chief Complaint  Patient presents with  . Abdominal Pain     (Consider location/radiation/quality/duration/timing/severity/associated sxs/prior Treatment) HPI Comments: Patient is a 29 year old male with history of type 1 diabetes, chronic epigastric pain felt to be due to gastroparesis, and recent cholecystectomy. He presents today with complaints of severe epigastric pain that started yesterday evening at approximately 10:00. This is associated with nausea and nonbloody vomiting, but no diarrhea. He denies fevers or chills. He denies any urinary complaints.  He states that his primary doctor will not help him and his gastroenterologist (Dr. [name]) is unable to see him for another 2 weeks. He has had multiple admissions with similar presentations.  Patient is a 29 y.o. male presenting with abdominal pain. The history is provided by the patient.  Abdominal Pain Pain location:  Epigastric Pain quality: stabbing   Pain radiates to:  Does not radiate Pain severity:  Severe Onset quality:  Sudden Duration:  10 hours Timing:  Constant Progression:  Worsening Chronicity:  Recurrent Relieved by:  Nothing Worsened by:  Nothing tried Ineffective treatments:  None tried Associated symptoms: nausea and vomiting   Associated symptoms: no diarrhea     Past Medical History  Diagnosis Date  . Diabetes mellitus     type 1  . Arthritis   . GERD (gastroesophageal reflux disease)   . PUD (peptic ulcer disease)   . Hypertension   . Heart murmur     valvular stenosis  . Neuropathy   . Diabetic gastroparesis associated with type 1 diabetes mellitus   . S/P arthroscopic knee surgery 07/04/2011  . Scoliosis 07/11/2012  . Chronic back pain   . Chronic abdominal pain   . Nausea and vomiting     chronic, recurrent  . Acute esophagitis   . Gastroparesis   . Erosive  esophagitis 12/23/2013    "severe" per EGD   . Insomnia   . Anxiety   . Hypercholesteremia    Past Surgical History  Procedure Laterality Date  . Tympanostomy tube placement    . Knee arthroscopy  06/30/2011    Procedure: ARTHROSCOPY KNEE;  Surgeon: Carole Civil, MD;  Location: [facility];  Service: Orthopedics;  Laterality: Right;  diagnostic arthroscopy  . Esophagogastroduodenoscopy (egd) with propofol  06/17/2013    Dr. Oneida Alar: two gastric ulcers in fundus, mild antral gastritis, candida esophagitis  . Esophageal biopsy  06/17/2013    Procedure: GASTRIC ULCER AND ANTRAL BIOPSIES; ESOPHAGEAL BRUSHING;  Surgeon: Danie Binder, MD;  Location: [facility];  Service: Endoscopy;;  . Esophagogastroduodenoscopy (egd) with propofol N/A 09/11/2013    Dr. [name]: abnormal hypopharynx, question massively enlarged tonsils, stomach full of food precluded exam, needs EGD for verification of ulcer healing at a later date  . Esophagogastroduodenoscopy (egd) with propofol N/A 12/23/2013    RMR: Severe exudative esophagitis likely predominatly reflux related. Superimposed Candida infection not excluded status post KOH brushing and biopsy. Localized excoriating gastric mucosa most consistant with trauma(vomiting). No evidence of peptic ulcer disease or other gastric/duodenal pathology. I suspect severe inflammation involving the distal esophagus may account  for at least some of patii  . Esophageal biopsy N/A 12/23/2013    Procedure: ESOPHAGEAL BIOPSY;  Surgeon: Daneil Dolin, MD;  Location: [facility];  Service: Endoscopy;  Laterality: N/A;  . Cholecystectomy N/A 01/28/2014    Procedure: LAPAROSCOPIC CHOLECYSTECTOMY;  Surgeon: Jamesetta So, MD;  Location: [facility];  Service: General;  Laterality: N/A;   Family History  Problem Relation Age of Onset  . Cancer Father   . Alcohol abuse Father   . Pseudochol deficiency Neg Hx   . Malignant hyperthermia Neg Hx   . Hypotension Neg Hx   . Anesthesia problems Neg Hx    . Colon cancer Neg Hx    History  Substance Use Topics  . Smoking status: Current Some Day Smoker -- 0.25 packs/day for 10 years    Types: Cigarettes  . Smokeless tobacco: Former Systems developer    Quit date: 02/17/2013     Comment: smokes 4 cigarettes a day   . Alcohol Use: No    Review of Systems  Gastrointestinal: Positive for nausea, vomiting and abdominal pain. Negative for diarrhea.  All other systems reviewed and are negative.     Allergies  Hydrocodone; Tramadol; Ibuprofen; Naproxen; Sulfa antibiotics; and Morphine and related  Home Medications   Prior to Admission medications   Medication Sig Start Date End Date Taking? Authorizing Provider  gabapentin (NEURONTIN) 800 MG tablet Take 800 mg by mouth 3 (three) times daily.   Yes Historical Provider, MD  insulin NPH-regular Human (NOVOLIN 70/30) (70-30) 100 UNIT/ML injection Inject 20 Units into the skin 2 (two) times daily with a meal. 15 units in the morning and 25 at night. Patient taking differently: Inject 15-25 Units into the skin 2 (two) times daily with a meal. 15 units in the morning and 25 at night. 11/17/13  Yes Kathie Dike, MD  insulin regular (NOVOLIN R,HUMULIN R) 100 units/mL injection Inject 2-10 Units into the skin 3 (three) times daily before meals. Sliding scale.   Yes Historical Provider, MD  lisinopril (PRINIVIL,ZESTRIL) 20 MG tablet Take 20 mg by mouth daily.   Yes Historical Provider, MD  metoCLOPramide (REGLAN) 10 MG tablet Take 1 tablet (10 mg total) by mouth 4 (four) times daily -  before meals and at bedtime. 03/10/14  Yes Kathie Dike, MD  ondansetron (ZOFRAN) 4 MG tablet Take 1 tablet (4 mg total) by mouth 3 (three) times daily with meals. Patient taking differently: Take 4 mg by mouth every 8 (eight) hours as needed for nausea or vomiting.  12/09/13  Yes Orvil Feil, NP  pantoprazole (PROTONIX) 40 MG tablet Take 1 tablet (40 mg total) by mouth 2 (two) times daily before a meal. 10/01/13  Yes Kathie Dike, MD  pravastatin (PRAVACHOL) 20 MG tablet Take 20 mg by mouth daily.  09/10/13 09/10/14 Yes Historical Provider, MD  promethazine (PHENERGAN) 25 MG tablet Take 1 tablet (25 mg total) by mouth every 6 (six) hours as needed for nausea or vomiting. 12/09/13  Yes Orvil Feil, NP  tiZANidine (ZANAFLEX) 4 MG tablet Take 1 tablet by mouth every 6 (six) hours as needed for muscle spasms.  07/30/13  Yes Historical Provider, MD  fluconazole (DIFLUCAN) 50 MG tablet Take 5 tablets (250 mg total) by mouth daily. 03/10/14   Kathie Dike, MD  nicotine (NICODERM CQ - DOSED IN MG/24 HOURS) 14 mg/24hr patch Place 1 patch (14 mg total) onto the skin daily. Patient not taking: Reported on 03/06/2014 02/02/14   Nita Sells, MD  oxyCODONE-acetaminophen (PERCOCET) 7.5-325 MG per tablet Take 1 tablet by mouth every 4 (four) hours as needed for pain. 03/10/14   Kathie Dike, MD   BP 158/93 mmHg  Pulse 82  Temp(Src) 98.1 F (36.7 C) (Oral)  Resp 18  Ht 6' 1.5" (1.867 m)  Wt 185 lb (83.915 kg)  BMI 24.07 kg/m2  SpO2 100% Physical Exam  Constitutional: He is oriented to person, place, and time. He appears well-developed and well-nourished. No distress.  HENT:  Head: Normocephalic and atraumatic.  Mouth/Throat: Oropharynx is clear and moist.  Neck: Normal range of motion. Neck supple.  Cardiovascular: Normal rate, regular rhythm and normal heart sounds.   No murmur heard. Pulmonary/Chest: Effort normal and breath sounds normal. No respiratory distress. He has no wheezes.  Abdominal: Soft. Bowel sounds are normal. He exhibits no distension and no mass. There is tenderness. There is no rebound and no guarding.  There is tenderness to palpation in the epigastric region.  Musculoskeletal: Normal range of motion. He exhibits no edema.  Lymphadenopathy:    He has no cervical adenopathy.  Neurological: He is alert and oriented to person, place, and time.  Skin: Skin is warm and dry. He is not diaphoretic.   Nursing note and vitals reviewed.   ED Course  Procedures (including critical care time) Labs Review Labs Reviewed  CBC WITH DIFFERENTIAL/PLATELET  LIPASE, BLOOD  COMPREHENSIVE METABOLIC PANEL    Imaging Review No results found.   EKG Interpretation None      MDM   Final diagnoses:  None    Patient with history of chronic abdominal pain. He presents with ongoing epigastric discomfort several weeks following cholecystectomy. His physical examination reveals tenderness in the epigastric region, however no peritoneal signs. Upon reviewing his record he has been admitted in the past with similar complaints, however no definite cause has been found.  He was given IV hydration, pain medication, anti-medics and will be observed further. His laboratory studies at this point are pending. His care will be signed out at shift change to Dr. Wyvonnia Dusky who will obtain the results of the studies and determine the final disposition.    Veryl Speak, MD 03/20/14 [phone]

## 2014-03-19 NOTE — ED Notes (Signed)
EDP aware of pt CBG. no other orders given.

## 2014-03-19 NOTE — ED Notes (Signed)
EDP aware of pt v/s. EDP reported okay to continue with d/c if pt has bp med dose at home. Pt reports hasn't taken bp meds today and reports does have dose at home. Pt aware that needs to take bp med as soon as gets home. nad noted at d/c.

## 2014-03-19 NOTE — ED Provider Notes (Signed)
Care assumed from Dr. Stark Jock at [facility]. Patient with recurrent upper abdominal pain, nausea and vomiting. Recent admission for same. Recent CT scan was negative for bile leak status post cholecystectomy.  Labs remarkable for hypokalemia. LFTs and lipase normal. Anion gap normal no evidence of DKA. Lactate normal.  UA negative.  Pain controlled in the ED.  Patient tolerating PO. Hypokalemia replaced.   patient will be given a short course of pain medication. He'll be given Miralax for constipation. Follow-up with Dr. [name] as scheduled.  BP 153/95 mmHg  Pulse 99  Temp(Src) 98.4 F (36.9 C) (Oral)  Resp 23  Ht 6' 1.5" (1.867 m)  Wt 185 lb (83.915 kg)  BMI 24.07 kg/m2  SpO2 97%   Brent Essex, MD 03/19/14 [phone]

## 2014-03-19 NOTE — ED Notes (Signed)
Pt has chronic abdominal pain, pain started at 2000 hrs last night, nausea, denies V/D. Have appt at [facility] 04/23/14 for stomach problems.

## 2014-03-19 NOTE — Discharge Instructions (Signed)
Abdominal Pain Take your medications as prescribed. Followup with Dr. [name]. Return to the ED if you develop new or worsening symptoms. Many things can cause abdominal pain. Usually, abdominal pain is not caused by a disease and will improve without treatment. It can often be observed and treated at home. Your health care provider will do a physical exam and possibly order blood tests and X-rays to help determine the seriousness of your pain. However, in many cases, more time must pass before a clear cause of the pain can be found. Before that point, your health care provider may not know if you need more testing or further treatment. HOME CARE INSTRUCTIONS  Monitor your abdominal pain for any changes. The following actions may help to alleviate any discomfort you are experiencing:  Only take over-the-counter or prescription medicines as directed by your health care provider.  Do not take laxatives unless directed to do so by your health care provider.  Try a clear liquid diet (broth, tea, or water) as directed by your health care provider. Slowly move to a bland diet as tolerated. SEEK MEDICAL CARE IF:  You have unexplained abdominal pain.  You have abdominal pain associated with nausea or diarrhea.  You have pain when you urinate or have a bowel movement.  You experience abdominal pain that wakes you in the night.  You have abdominal pain that is worsened or improved by eating food.  You have abdominal pain that is worsened with eating fatty foods.  You have a fever. SEEK IMMEDIATE MEDICAL CARE IF:   Your pain does not go away within 2 hours.  You keep throwing up (vomiting).  Your pain is felt only in portions of the abdomen, such as the right side or the left lower portion of the abdomen.  You pass bloody or black tarry stools. MAKE SURE YOU:  Understand these instructions.   Will watch your condition.   Will get help right away if you are not doing well or get worse.   Document Released: 11/02/2004 Document Revised: 01/28/2013 Document Reviewed: 10/02/2012 [facility] Patient Information 2015 [place], [place]. This information is not intended to replace advice given to you by your health care provider. Make sure you discuss any questions you have with your health care provider.

## 2014-03-27 ENCOUNTER — Telehealth: Payer: Self-pay | Admitting: Internal Medicine

## 2014-03-27 NOTE — Telephone Encounter (Signed)
Pt is aware.  Please move up his appt.

## 2014-03-27 NOTE — Telephone Encounter (Signed)
Narcotic therapy is not in his best interest. He should touch base with his primary care doctor. Let's move his appointment up by one week with [place]. If he can't stand the pain or it gets worse, he has to fall back on the emergency department.

## 2014-03-27 NOTE — Telephone Encounter (Signed)
PATIENT CALLED COMPLAINING OF SEVERE ABD PAIN, HAS GONE TO HOSPITAL.  iF HE CAN NOT BE SEEN SOONER CAN HE HAVE SOMETHING FOR PAIN.  PLEASE CALL

## 2014-03-27 NOTE — Telephone Encounter (Signed)
Spoke with the pt-  He said he continues to have abd pain and vomiting. He vomits 1-2x a day. He is on reglan and zofran and that is helping some. He is following a gastroparesis diet. He is currently on clear liquids only. He has an ov March 4th with [facility].  He is concerned because he is having so much abd pain. He said that it was all the time and he doesn't have any pain meds.  He also said they told him in the ER to not come back because they couldn't do anything for him and he doesn't know what to do.  He wants to know if he can have something for the pain?

## 2014-03-29 ENCOUNTER — Encounter (HOSPITAL_COMMUNITY): Payer: Self-pay | Admitting: Emergency Medicine

## 2014-03-29 ENCOUNTER — Emergency Department (HOSPITAL_COMMUNITY)
Admission: EM | Admit: 2014-03-29 | Discharge: 2014-03-29 | Disposition: A | Discharge status: Home / Self Care | Payer: Managed Care, Other (non HMO) | Attending: Emergency Medicine | Admitting: Emergency Medicine

## 2014-03-29 DIAGNOSIS — Z79899 Other long term (current) drug therapy: Secondary | ICD-10-CM | POA: Diagnosis not present

## 2014-03-29 DIAGNOSIS — K219 Gastro-esophageal reflux disease without esophagitis: Secondary | ICD-10-CM | POA: Diagnosis not present

## 2014-03-29 DIAGNOSIS — M419 Scoliosis, unspecified: Secondary | ICD-10-CM | POA: Diagnosis not present

## 2014-03-29 DIAGNOSIS — R1013 Epigastric pain: Secondary | ICD-10-CM

## 2014-03-29 DIAGNOSIS — G621 Alcoholic polyneuropathy: Secondary | ICD-10-CM | POA: Diagnosis not present

## 2014-03-29 DIAGNOSIS — K3184 Gastroparesis: Secondary | ICD-10-CM | POA: Diagnosis not present

## 2014-03-29 DIAGNOSIS — Z8711 Personal history of peptic ulcer disease: Secondary | ICD-10-CM | POA: Diagnosis not present

## 2014-03-29 DIAGNOSIS — R000 Tachycardia, unspecified: Secondary | ICD-10-CM | POA: Diagnosis not present

## 2014-03-29 DIAGNOSIS — E1043 Type 1 diabetes mellitus with diabetic autonomic (poly)neuropathy: Secondary | ICD-10-CM | POA: Diagnosis not present

## 2014-03-29 DIAGNOSIS — I10 Essential (primary) hypertension: Secondary | ICD-10-CM | POA: Insufficient documentation

## 2014-03-29 DIAGNOSIS — M1990 Unspecified osteoarthritis, unspecified site: Secondary | ICD-10-CM | POA: Insufficient documentation

## 2014-03-29 DIAGNOSIS — Z9049 Acquired absence of other specified parts of digestive tract: Secondary | ICD-10-CM | POA: Insufficient documentation

## 2014-03-29 DIAGNOSIS — R112 Nausea with vomiting, unspecified: Secondary | ICD-10-CM | POA: Diagnosis not present

## 2014-03-29 DIAGNOSIS — R011 Cardiac murmur, unspecified: Secondary | ICD-10-CM | POA: Insufficient documentation

## 2014-03-29 DIAGNOSIS — Z720 Tobacco use: Secondary | ICD-10-CM | POA: Insufficient documentation

## 2014-03-29 DIAGNOSIS — Z794 Long term (current) use of insulin: Secondary | ICD-10-CM | POA: Diagnosis not present

## 2014-03-29 DIAGNOSIS — M549 Dorsalgia, unspecified: Secondary | ICD-10-CM | POA: Insufficient documentation

## 2014-03-29 DIAGNOSIS — G8929 Other chronic pain: Secondary | ICD-10-CM | POA: Insufficient documentation

## 2014-03-29 DIAGNOSIS — G4700 Insomnia, unspecified: Secondary | ICD-10-CM | POA: Diagnosis not present

## 2014-03-29 DIAGNOSIS — F419 Anxiety disorder, unspecified: Secondary | ICD-10-CM | POA: Diagnosis not present

## 2014-03-29 DIAGNOSIS — Z9889 Other specified postprocedural states: Secondary | ICD-10-CM | POA: Insufficient documentation

## 2014-03-29 DIAGNOSIS — E780 Pure hypercholesterolemia: Secondary | ICD-10-CM | POA: Insufficient documentation

## 2014-03-29 LAB — CBC WITH DIFFERENTIAL/PLATELET
Basophils Absolute: 0.1 10*3/uL (ref 0.0–0.1)
Basophils Relative: 1 % (ref 0–1)
Eosinophils Absolute: 0.3 10*3/uL (ref 0.0–0.7)
Eosinophils Relative: 2 % (ref 0–5)
HCT: 40 % (ref 39.0–52.0)
Hemoglobin: 13.2 g/dL (ref 13.0–17.0)
Lymphocytes Relative: 10 % — ABNORMAL LOW (ref 12–46)
Lymphs Abs: 1.9 10*3/uL (ref 0.7–4.0)
MCH: 29.6 pg (ref 26.0–34.0)
MCHC: 33 g/dL (ref 30.0–36.0)
MCV: 89.7 fL (ref 78.0–100.0)
Monocytes Absolute: 0.8 10*3/uL (ref 0.1–1.0)
Monocytes Relative: 4 % (ref 3–12)
Neutro Abs: 16.7 10*3/uL — ABNORMAL HIGH (ref 1.7–7.7)
Neutrophils Relative %: 84 % — ABNORMAL HIGH (ref 43–77)
Platelets: 416 10*3/uL — ABNORMAL HIGH (ref 150–400)
RBC: 4.46 MIL/uL (ref 4.22–5.81)
RDW: 14.1 % (ref 11.5–15.5)
WBC: 19.8 10*3/uL — ABNORMAL HIGH (ref 4.0–10.5)

## 2014-03-29 LAB — URINALYSIS, ROUTINE W REFLEX MICROSCOPIC
Bilirubin Urine: NEGATIVE
Glucose, UA: >1000 mg/dL — AB
Hgb urine dipstick: NEGATIVE
Ketones, ur: NEGATIVE mg/dL
Leukocytes, UA: NEGATIVE
Nitrite: NEGATIVE
Protein, ur: NEGATIVE mg/dL
Specific Gravity, Urine: 1.015 (ref 1.005–1.030)
Urobilinogen, UA: 0.2 mg/dL (ref 0.0–1.0)
pH: 8 (ref 5.0–8.0)

## 2014-03-29 LAB — COMPREHENSIVE METABOLIC PANEL
ALT: 27 U/L (ref 0–53)
AST: 23 U/L (ref 0–37)
Albumin: 4 g/dL (ref 3.5–5.2)
Alkaline Phosphatase: 119 U/L — ABNORMAL HIGH (ref 39–117)
Anion gap: 4 — ABNORMAL LOW (ref 5–15)
BUN: 14 mg/dL (ref 6–23)
CO2: 31 mmol/L (ref 19–32)
Calcium: 9.5 mg/dL (ref 8.4–10.5)
Chloride: 99 mmol/L (ref 96–112)
Creatinine, Ser: 0.89 mg/dL (ref 0.50–1.35)
GFR calc Af Amer: >90 mL/min (ref >90)
GFR calc non Af Amer: >90 mL/min (ref >90)
Glucose, Bld: 239 mg/dL — ABNORMAL HIGH (ref 70–99)
Potassium: 4.6 mmol/L (ref 3.5–5.1)
Sodium: 134 mmol/L — ABNORMAL LOW (ref 135–145)
Total Bilirubin: 0.6 mg/dL (ref 0.3–1.2)
Total Protein: 7.6 g/dL (ref 6.0–8.3)

## 2014-03-29 LAB — URINE MICROSCOPIC-ADD ON

## 2014-03-29 LAB — LIPASE, BLOOD: Lipase: 19 U/L (ref 11–59)

## 2014-03-29 MED ORDER — HYDROMORPHONE HCL 2 MG/ML IJ SOLN
1.5000 mg | Freq: Once | INTRAMUSCULAR | Status: AC
  Administered 2014-03-29: 1.5 mg via INTRAVENOUS
  Filled 2014-03-29: qty 1

## 2014-03-29 MED ORDER — OXYCODONE-ACETAMINOPHEN 5-325 MG PO TABS: 1.0000 | ORAL_TABLET | ORAL | Status: DC | PRN

## 2014-03-29 MED ORDER — HYDROMORPHONE HCL 2 MG/ML IJ SOLN
2.0000 mg | Freq: Once | INTRAMUSCULAR | Status: AC
  Administered 2014-03-29: 2 mg via INTRAVENOUS
  Filled 2014-03-29: qty 1

## 2014-03-29 MED ORDER — PROMETHAZINE HCL 25 MG/ML IJ SOLN
12.5000 mg | Freq: Once | INTRAMUSCULAR | Status: AC
  Administered 2014-03-29: 12.5 mg via INTRAVENOUS
  Filled 2014-03-29: qty 1

## 2014-03-29 MED ORDER — SODIUM CHLORIDE 0.9 % IV BOLUS (SEPSIS)
1000.0000 mL | Freq: Once | INTRAVENOUS | Status: AC
  Administered 2014-03-29: 1000 mL via INTRAVENOUS

## 2014-03-29 NOTE — ED Provider Notes (Signed)
CSN: [id number]     Arrival date & time 03/29/14  [id number] History  This chart was scribed for Virgel Manifold, MD by Dellis Filbert, ED Scribe. The patient was seen in [place] and the patient's care was started at 9:42 AM.  Chief Complaint  Patient presents with  . Emesis   Patient is a 29 y.o. male presenting with vomiting. The history is provided by the patient. No language interpreter was used.  Emesis Severity:  Severe Duration:  8 hours Progression:  Unchanged Chronicity:  Recurrent Associated symptoms: abdominal pain   Associated symptoms: no chills and no diarrhea   Abdominal pain:    Location:  Epigastric   Severity:  Severe   Duration:  8 hours   Chronicity:  Chronic   HPI Comments: Brent Daniels is a 29 y.o. male with chronic abdominal painwho presents to the Emergency Department complaining of epigastric abdominal pain, onset 2 AM this evening. The pain radiates through to his back and down both legs. Has nausea and several episodes of vomiting as associated symptoms. He is been seen here several times with similar symptoms and is usually treated with dilaudid and phenergan which provides relief. Pt is scheduled to his his gastroenterologist. Dr. [name] on the 4th of March. No SOB, fever or chills.  Past Medical History  Diagnosis Date  . Diabetes mellitus     type 1  . Arthritis   . GERD (gastroesophageal reflux disease)   . PUD (peptic ulcer disease)   . Hypertension   . Heart murmur     valvular stenosis  . Neuropathy   . Diabetic gastroparesis associated with type 1 diabetes mellitus   . S/P arthroscopic knee surgery 07/04/2011  . Scoliosis 07/11/2012  . Chronic back pain   . Chronic abdominal pain   . Nausea and vomiting     chronic, recurrent  . Acute esophagitis   . Gastroparesis   . Erosive esophagitis 12/23/2013    "severe" per EGD   . Insomnia   . Anxiety   . Hypercholesteremia    Past Surgical History  Procedure Laterality Date  . Tympanostomy  tube placement    . Knee arthroscopy  06/30/2011    Procedure: ARTHROSCOPY KNEE;  Surgeon: Carole Civil, MD;  Location: [facility];  Service: Orthopedics;  Laterality: Right;  diagnostic arthroscopy  . Esophagogastroduodenoscopy (egd) with propofol  06/17/2013    Dr. Oneida Alar: two gastric ulcers in fundus, mild antral gastritis, candida esophagitis  . Esophageal biopsy  06/17/2013    Procedure: GASTRIC ULCER AND ANTRAL BIOPSIES; ESOPHAGEAL BRUSHING;  Surgeon: Danie Binder, MD;  Location: [facility];  Service: Endoscopy;;  . Esophagogastroduodenoscopy (egd) with propofol N/A 09/11/2013    Dr. [name]: abnormal hypopharynx, question massively enlarged tonsils, stomach full of food precluded exam, needs EGD for verification of ulcer healing at a later date  . Esophagogastroduodenoscopy (egd) with propofol N/A 12/23/2013    RMR: Severe exudative esophagitis likely predominatly reflux related. Superimposed Candida infection not excluded status post KOH brushing and biopsy. Localized excoriating gastric mucosa most consistant with trauma(vomiting). No evidence of peptic ulcer disease or other gastric/duodenal pathology. I suspect severe inflammation involving the distal esophagus may account  for at least some of patii  . Esophageal biopsy N/A 12/23/2013    Procedure: ESOPHAGEAL BIOPSY;  Surgeon: Daneil Dolin, MD;  Location: [facility];  Service: Endoscopy;  Laterality: N/A;  . Cholecystectomy N/A 01/28/2014    Procedure: LAPAROSCOPIC CHOLECYSTECTOMY;  Surgeon: Jamesetta So,  MD;  Location: [facility];  Service: General;  Laterality: N/A;   Family History  Problem Relation Age of Onset  . Cancer Father   . Alcohol abuse Father   . Pseudochol deficiency Neg Hx   . Malignant hyperthermia Neg Hx   . Hypotension Neg Hx   . Anesthesia problems Neg Hx   . Colon cancer Neg Hx    History  Substance Use Topics  . Smoking status: Current Some Day Smoker -- 0.25 packs/day for 10 years    Types: Cigarettes  .  Smokeless tobacco: Former Systems developer    Quit date: 02/17/2013     Comment: smokes 4 cigarettes a day   . Alcohol Use: No    Review of Systems  Constitutional: Negative for fever and chills.  Respiratory: Negative for shortness of breath.   Gastrointestinal: Positive for nausea, vomiting and abdominal pain. Negative for diarrhea.  Musculoskeletal: Positive for back pain.  All other systems reviewed and are negative.   Allergies  Hydrocodone; Tramadol; Ibuprofen; Naproxen; Sulfa antibiotics; and Morphine and related  Home Medications   Prior to Admission medications   Medication Sig Start Date End Date Taking? Authorizing Provider  fluconazole (DIFLUCAN) 50 MG tablet Take 5 tablets (250 mg total) by mouth daily. Patient not taking: Reported on 03/19/2014 03/10/14   Kathie Dike, MD  gabapentin (NEURONTIN) 800 MG tablet Take 800 mg by mouth 3 (three) times daily.    Historical Provider, MD  insulin NPH-regular Human (NOVOLIN 70/30) (70-30) 100 UNIT/ML injection Inject 20 Units into the skin 2 (two) times daily with a meal. 15 units in the morning and 25 at night. Patient taking differently: Inject 15-25 Units into the skin 2 (two) times daily with a meal. 15 units in the morning and 25 at night. 11/17/13   Kathie Dike, MD  insulin regular (NOVOLIN R,HUMULIN R) 100 units/mL injection Inject 2-10 Units into the skin 3 (three) times daily before meals. Sliding scale.    Historical Provider, MD  lisinopril (PRINIVIL,ZESTRIL) 20 MG tablet Take 20 mg by mouth daily.    Historical Provider, MD  metoCLOPramide (REGLAN) 10 MG tablet Take 1 tablet (10 mg total) by mouth 4 (four) times daily -  before meals and at bedtime. 03/10/14   Kathie Dike, MD  nicotine (NICODERM CQ - DOSED IN MG/24 HOURS) 14 mg/24hr patch Place 1 patch (14 mg total) onto the skin daily. Patient not taking: Reported on 03/06/2014 02/02/14   Nita Sells, MD  ondansetron (ZOFRAN) 4 MG tablet Take 1 tablet (4 mg total) by  mouth 3 (three) times daily with meals. Patient taking differently: Take 4 mg by mouth every 8 (eight) hours as needed for nausea or vomiting.  12/09/13   Orvil Feil, NP  oxyCODONE-acetaminophen (PERCOCET/ROXICET) 5-325 MG per tablet Take 2 tablets by mouth every 4 (four) hours as needed for severe pain. 03/19/14   [name], MD  pantoprazole (PROTONIX) 40 MG tablet Take 1 tablet (40 mg total) by mouth 2 (two) times daily before a meal. 10/01/13   Kathie Dike, MD  polyethylene glycol (MIRALAX) packet Take 17 g by mouth daily. 03/19/14   [name], MD  pravastatin (PRAVACHOL) 20 MG tablet Take 20 mg by mouth daily.  09/10/13 09/10/14  Historical Provider, MD  promethazine (PHENERGAN) 25 MG tablet Take 1 tablet (25 mg total) by mouth every 6 (six) hours as needed for nausea or vomiting. 12/09/13   Orvil Feil, NP  tiZANidine (ZANAFLEX) 4 MG tablet Take  1 tablet by mouth every 6 (six) hours as needed for muscle spasms.  07/30/13   Historical Provider, MD   BP 150/101 mmHg  Pulse 116  Temp(Src) 98.4 F (36.9 C) (Oral)  Resp 18  Ht 6\' 2"  (1.88 m)  Wt 190 lb (86.183 kg)  BMI 24.38 kg/m2  SpO2 100% Physical Exam  Constitutional: He appears well-developed and well-nourished. No distress.  Actively vomiting.  HENT:  Head: Normocephalic and atraumatic.  Eyes: Conjunctivae are normal. Right eye exhibits no discharge. Left eye exhibits no discharge.  Neck: Neck supple.  Cardiovascular: Regular rhythm and normal heart sounds.  Tachycardia present.  Exam reveals no gallop and no friction rub.   No murmur heard. Pulmonary/Chest: Effort normal and breath sounds normal. No respiratory distress.  Abdominal: Soft. He exhibits no distension. There is tenderness. There is guarding.  Tender in his epigastrium with voluntary guarding  Musculoskeletal: He exhibits no edema or tenderness.  Neurological: He is alert.  Skin: Skin is warm and dry.  Psychiatric: He has a normal mood and affect. His  behavior is normal. Thought content normal.  Nursing note and vitals reviewed.   ED Course  Procedures  DIAGNOSTIC STUDIES: Oxygen Saturation is 100% on room air, normal by my interpretation.    COORDINATION OF CARE: 9:47 AM Discussed treatment plan with pt at bedside and pt agreed to plan.  Labs Review Labs Reviewed  COMPREHENSIVE METABOLIC PANEL - Abnormal; Notable for the following:    Sodium 134 (*)    Glucose, Bld 239 (*)    Alkaline Phosphatase 119 (*)    Anion gap 4 (*)    All other components within normal limits  CBC WITH DIFFERENTIAL/PLATELET - Abnormal; Notable for the following:    WBC 19.8 (*)    Platelets 416 (*)    Neutrophils Relative % 84 (*)    Neutro Abs 16.7 (*)    Lymphocytes Relative 10 (*)    All other components within normal limits  URINALYSIS, ROUTINE W REFLEX MICROSCOPIC - Abnormal; Notable for the following:    Glucose, UA >1000 (*)    All other components within normal limits  LIPASE, BLOOD  URINE MICROSCOPIC-ADD ON    Imaging Review No results found.   EKG Interpretation None      MDM   Final diagnoses:  Epigastric pain  Non-intractable vomiting with nausea, vomiting of unspecified type   29year-old male with abdominal pain and nausea and vomiting. History of gastroparesis and reports that he is followed by Dr. [name]. Similarity of current symptoms to previous. He is afebrile. Hemodynamically stable. Has a leukocytosis, but this is nonspecific. Lipase is normal. Hyperglycemia without ketonuria or metabolic acidosis. She's symptomatically with improvement. Of note, patient with numerous requests for pain medicine and other behaviors concerning for possible med seeking such as requesting nursing to push his medicines faster. Low suspicion for emergent process. Pt feels comfortable with discharge.   I personally preformed the services scribed in my presence. The recorded information has been reviewed is accurate. Virgel Manifold,  MD.      Virgel Manifold, MD 04/08/14 [phone]

## 2014-03-29 NOTE — ED Notes (Signed)
Pt updated on delay, expressed understanding,  

## 2014-03-29 NOTE — ED Notes (Signed)
Pt c/o abd pain with n/v that pt reports has been ongoing since he had his gallbadder out in December,

## 2014-03-29 NOTE — ED Notes (Signed)
Pt states that the pain is "a little better" still remains constant, urine sample obtained, sent to lab,

## 2014-03-29 NOTE — Discharge Instructions (Signed)

## 2014-03-29 NOTE — ED Notes (Addendum)
Dr Wilson Singer notified of pt's request for pain medication,

## 2014-03-29 NOTE — ED Notes (Signed)
While drawing up pt's medications, pt insisted on RN giving him his pain medication first, explained to pt that the pain medication could have a side effect of n/v and that it would be better to give the nausea medication first.  After being given phenergan in upper port with 10cc of NS, pt requested RN to given dilaudid at port closeted to iv site so that he could get the pain relief faster, explained to pt that RN could not push the 1.5mg  dilaudid extremitally fast. Pt expressed understanding

## 2014-03-29 NOTE — ED Notes (Signed)
Pt reports that his pain is not any better, nausea has improved, Dr Wilson Singer notified, additional orders given

## 2014-03-29 NOTE — ED Notes (Signed)
Having vomiting since 2am, vomiting about every 30 minutes.

## 2014-03-29 NOTE — ED Notes (Signed)
Pt unable to obtain urine sample

## 2014-03-30 ENCOUNTER — Encounter (HOSPITAL_COMMUNITY): Payer: Self-pay | Admitting: Emergency Medicine

## 2014-03-30 ENCOUNTER — Inpatient Hospital Stay (HOSPITAL_COMMUNITY): Payer: Managed Care, Other (non HMO)

## 2014-03-30 ENCOUNTER — Emergency Department (HOSPITAL_COMMUNITY): Payer: Managed Care, Other (non HMO)

## 2014-03-30 ENCOUNTER — Inpatient Hospital Stay (HOSPITAL_COMMUNITY)
Admission: EM | Admit: 2014-03-30 | Discharge: 2014-03-31 | DRG: 074 | Discharge status: Against Medical Advice | Payer: Managed Care, Other (non HMO) | Attending: Family Medicine | Admitting: Family Medicine

## 2014-03-30 DIAGNOSIS — E780 Pure hypercholesterolemia: Secondary | ICD-10-CM | POA: Diagnosis present

## 2014-03-30 DIAGNOSIS — I10 Essential (primary) hypertension: Secondary | ICD-10-CM | POA: Diagnosis present

## 2014-03-30 DIAGNOSIS — M419 Scoliosis, unspecified: Secondary | ICD-10-CM | POA: Diagnosis present

## 2014-03-30 DIAGNOSIS — K3184 Gastroparesis: Secondary | ICD-10-CM | POA: Diagnosis present

## 2014-03-30 DIAGNOSIS — Z809 Family history of malignant neoplasm, unspecified: Secondary | ICD-10-CM | POA: Diagnosis not present

## 2014-03-30 DIAGNOSIS — E1049 Type 1 diabetes mellitus with other diabetic neurological complication: Secondary | ICD-10-CM

## 2014-03-30 DIAGNOSIS — Z720 Tobacco use: Secondary | ICD-10-CM

## 2014-03-30 DIAGNOSIS — K219 Gastro-esophageal reflux disease without esophagitis: Secondary | ICD-10-CM | POA: Diagnosis present

## 2014-03-30 DIAGNOSIS — M1990 Unspecified osteoarthritis, unspecified site: Secondary | ICD-10-CM | POA: Diagnosis present

## 2014-03-30 DIAGNOSIS — R739 Hyperglycemia, unspecified: Secondary | ICD-10-CM | POA: Diagnosis not present

## 2014-03-30 DIAGNOSIS — E1165 Type 2 diabetes mellitus with hyperglycemia: Secondary | ICD-10-CM | POA: Diagnosis present

## 2014-03-30 DIAGNOSIS — E1143 Type 2 diabetes mellitus with diabetic autonomic (poly)neuropathy: Secondary | ICD-10-CM

## 2014-03-30 DIAGNOSIS — E1043 Type 1 diabetes mellitus with diabetic autonomic (poly)neuropathy: Secondary | ICD-10-CM | POA: Diagnosis not present

## 2014-03-30 DIAGNOSIS — E1140 Type 2 diabetes mellitus with diabetic neuropathy, unspecified: Secondary | ICD-10-CM | POA: Diagnosis present

## 2014-03-30 DIAGNOSIS — R079 Chest pain, unspecified: Secondary | ICD-10-CM

## 2014-03-30 DIAGNOSIS — E785 Hyperlipidemia, unspecified: Secondary | ICD-10-CM

## 2014-03-30 DIAGNOSIS — G8929 Other chronic pain: Secondary | ICD-10-CM | POA: Diagnosis present

## 2014-03-30 DIAGNOSIS — R7989 Other specified abnormal findings of blood chemistry: Secondary | ICD-10-CM

## 2014-03-30 DIAGNOSIS — R1084 Generalized abdominal pain: Secondary | ICD-10-CM

## 2014-03-30 DIAGNOSIS — Z811 Family history of alcohol abuse and dependence: Secondary | ICD-10-CM | POA: Diagnosis not present

## 2014-03-30 DIAGNOSIS — R000 Tachycardia, unspecified: Secondary | ICD-10-CM

## 2014-03-30 DIAGNOSIS — Z794 Long term (current) use of insulin: Secondary | ICD-10-CM | POA: Diagnosis not present

## 2014-03-30 DIAGNOSIS — D72829 Elevated white blood cell count, unspecified: Secondary | ICD-10-CM | POA: Diagnosis present

## 2014-03-30 DIAGNOSIS — F1910 Other psychoactive substance abuse, uncomplicated: Secondary | ICD-10-CM

## 2014-03-30 DIAGNOSIS — F17210 Nicotine dependence, cigarettes, uncomplicated: Secondary | ICD-10-CM | POA: Diagnosis present

## 2014-03-30 DIAGNOSIS — R109 Unspecified abdominal pain: Secondary | ICD-10-CM

## 2014-03-30 DIAGNOSIS — IMO0002 Reserved for concepts with insufficient information to code with codable children: Secondary | ICD-10-CM | POA: Diagnosis present

## 2014-03-30 LAB — CBC WITH DIFFERENTIAL/PLATELET
BASOS ABS: 0.1 10*3/uL (ref 0.0–0.1)
Basophils Relative: 1 % (ref 0–1)
Eosinophils Absolute: 0.1 10*3/uL (ref 0.0–0.7)
Eosinophils Relative: 1 % (ref 0–5)
HCT: 42.9 % (ref 39.0–52.0)
HEMOGLOBIN: 14.3 g/dL (ref 13.0–17.0)
Lymphocytes Relative: 10 % — ABNORMAL LOW (ref 12–46)
Lymphs Abs: 1.6 10*3/uL (ref 0.7–4.0)
MCH: 29.9 pg (ref 26.0–34.0)
MCHC: 33.3 g/dL (ref 30.0–36.0)
MCV: 89.6 fL (ref 78.0–100.0)
Monocytes Absolute: 0.5 10*3/uL (ref 0.1–1.0)
Monocytes Relative: 3 % (ref 3–12)
NEUTROS PCT: 85 % — AB (ref 43–77)
Neutro Abs: 13.2 10*3/uL — ABNORMAL HIGH (ref 1.7–7.7)
Platelets: 464 10*3/uL — ABNORMAL HIGH (ref 150–400)
RBC: 4.79 MIL/uL (ref 4.22–5.81)
RDW: 14.1 % (ref 11.5–15.5)
WBC: 15.5 10*3/uL — AB (ref 4.0–10.5)

## 2014-03-30 LAB — COMPREHENSIVE METABOLIC PANEL
ALBUMIN: 4.6 g/dL (ref 3.5–5.2)
ALT: 30 U/L (ref 0–53)
AST: 27 U/L (ref 0–37)
Alkaline Phosphatase: 134 U/L — ABNORMAL HIGH (ref 39–117)
Anion gap: 15 (ref 5–15)
BUN: 14 mg/dL (ref 6–23)
CHLORIDE: 92 mmol/L — AB (ref 96–112)
CO2: 25 mmol/L (ref 19–32)
CREATININE: 1.05 mg/dL (ref 0.50–1.35)
Calcium: 9.6 mg/dL (ref 8.4–10.5)
GFR calc Af Amer: >90 mL/min (ref >90)
Glucose, Bld: 420 mg/dL — ABNORMAL HIGH (ref 70–99)
POTASSIUM: 4.5 mmol/L (ref 3.5–5.1)
SODIUM: 132 mmol/L — AB (ref 135–145)
Total Bilirubin: 1.8 mg/dL — ABNORMAL HIGH (ref 0.3–1.2)
Total Protein: 8.5 g/dL — ABNORMAL HIGH (ref 6.0–8.3)

## 2014-03-30 LAB — LIPASE, BLOOD: Lipase: 16 U/L (ref 11–59)

## 2014-03-30 LAB — CBG MONITORING, ED
GLUCOSE-CAPILLARY: 354 mg/dL — AB (ref 70–99)
Glucose-Capillary: 399 mg/dL — ABNORMAL HIGH (ref 70–99)

## 2014-03-30 LAB — GLUCOSE, CAPILLARY: Glucose-Capillary: 398 mg/dL — ABNORMAL HIGH (ref 70–99)

## 2014-03-30 LAB — LACTIC ACID, PLASMA

## 2014-03-30 MED ORDER — SODIUM CHLORIDE 0.9 % IV BOLUS (SEPSIS)
1000.0000 mL | Freq: Once | INTRAVENOUS | Status: AC
  Administered 2014-03-30: 1000 mL via INTRAVENOUS

## 2014-03-30 MED ORDER — IOHEXOL 300 MG/ML  SOLN
100.0000 mL | Freq: Once | INTRAMUSCULAR | Status: AC | PRN
  Administered 2014-03-30: 100 mL via INTRAVENOUS

## 2014-03-30 MED ORDER — ACETAMINOPHEN 650 MG RE SUPP: 650.0000 mg | Freq: Four times a day (QID) | RECTAL | Status: DC | PRN

## 2014-03-30 MED ORDER — LISINOPRIL 10 MG PO TABS: 20.0000 mg | ORAL_TABLET | Freq: Every day | ORAL | Status: DC

## 2014-03-30 MED ORDER — INSULIN ASPART 100 UNIT/ML ~~LOC~~ SOLN
0.0000 [IU] | Freq: Every day | SUBCUTANEOUS | Status: DC
  Administered 2014-03-30: 5 [IU] via SUBCUTANEOUS

## 2014-03-30 MED ORDER — PROMETHAZINE HCL 25 MG/ML IJ SOLN
12.5000 mg | Freq: Four times a day (QID) | INTRAMUSCULAR | Status: DC | PRN
Start: 2014-03-30 — End: 2014-03-31
  Administered 2014-03-30: 25 mg via INTRAVENOUS
  Filled 2014-03-30: qty 1

## 2014-03-30 MED ORDER — PROMETHAZINE HCL 25 MG/ML IJ SOLN
INTRAMUSCULAR | Status: AC
  Filled 2014-03-30: qty 1

## 2014-03-30 MED ORDER — HYDROMORPHONE HCL 1 MG/ML IJ SOLN
1.0000 mg | INTRAMUSCULAR | Status: DC | PRN
  Administered 2014-03-30 – 2014-03-31 (×2): 1 mg via INTRAVENOUS
  Filled 2014-03-30 (×2): qty 1

## 2014-03-30 MED ORDER — PROMETHAZINE HCL 25 MG/ML IJ SOLN
12.5000 mg | Freq: Once | INTRAMUSCULAR | Status: AC
  Administered 2014-03-30: 12.5 mg via INTRAVENOUS
  Filled 2014-03-30 (×2): qty 1

## 2014-03-30 MED ORDER — INSULIN ASPART 100 UNIT/ML ~~LOC~~ SOLN
10.0000 [IU] | Freq: Once | SUBCUTANEOUS | Status: AC
  Administered 2014-03-30: 10 [IU] via INTRAVENOUS
  Filled 2014-03-30: qty 1

## 2014-03-30 MED ORDER — SODIUM CHLORIDE 0.9 % IV SOLN
INTRAVENOUS | Status: DC
  Administered 2014-03-30 (×2): via INTRAVENOUS

## 2014-03-30 MED ORDER — PANTOPRAZOLE SODIUM 40 MG IV SOLR: 40.0000 mg | INTRAVENOUS | Status: DC

## 2014-03-30 MED ORDER — SODIUM CHLORIDE 0.9 % IV SOLN: INTRAVENOUS | Status: DC

## 2014-03-30 MED ORDER — INSULIN ASPART 100 UNIT/ML ~~LOC~~ SOLN: 0.0000 [IU] | Freq: Three times a day (TID) | SUBCUTANEOUS | Status: DC

## 2014-03-30 MED ORDER — NICOTINE 14 MG/24HR TD PT24: 14.0000 mg | MEDICATED_PATCH | Freq: Every day | TRANSDERMAL | Status: DC

## 2014-03-30 MED ORDER — METOCLOPRAMIDE HCL 5 MG/ML IJ SOLN
10.0000 mg | Freq: Once | INTRAMUSCULAR | Status: AC
  Administered 2014-03-30: 10 mg via INTRAVENOUS
  Filled 2014-03-30: qty 2

## 2014-03-30 MED ORDER — PRAVASTATIN SODIUM 20 MG PO TABS
20.0000 mg | ORAL_TABLET | Freq: Every day | ORAL | Status: DC
  Filled 2014-03-30: qty 1

## 2014-03-30 MED ORDER — GABAPENTIN 400 MG PO CAPS
800.0000 mg | ORAL_CAPSULE | Freq: Three times a day (TID) | ORAL | Status: DC
  Administered 2014-03-30: 800 mg via ORAL
  Filled 2014-03-30: qty 2

## 2014-03-30 MED ORDER — PANTOPRAZOLE SODIUM 40 MG IV SOLR
40.0000 mg | INTRAVENOUS | Status: DC
  Administered 2014-03-30: 40 mg via INTRAVENOUS
  Filled 2014-03-30: qty 40

## 2014-03-30 MED ORDER — ONDANSETRON HCL 4 MG/2ML IJ SOLN
4.0000 mg | Freq: Four times a day (QID) | INTRAMUSCULAR | Status: DC | PRN
  Administered 2014-03-30 – 2014-03-31 (×2): 4 mg via INTRAVENOUS
  Filled 2014-03-30 (×3): qty 2

## 2014-03-30 MED ORDER — POLYETHYLENE GLYCOL 3350 17 G PO PACK: 17.0000 g | PACK | Freq: Every day | ORAL | Status: DC | PRN

## 2014-03-30 MED ORDER — SENNA 8.6 MG PO TABS
1.0000 | ORAL_TABLET | Freq: Two times a day (BID) | ORAL | Status: DC
  Administered 2014-03-30: 8.6 mg via ORAL
  Filled 2014-03-30: qty 1

## 2014-03-30 MED ORDER — HEPARIN SODIUM (PORCINE) 5000 UNIT/ML IJ SOLN
5000.0000 [IU] | Freq: Three times a day (TID) | INTRAMUSCULAR | Status: DC
  Administered 2014-03-30: 5000 [IU] via SUBCUTANEOUS
  Filled 2014-03-30: qty 1

## 2014-03-30 MED ORDER — METOCLOPRAMIDE HCL 10 MG PO TABS
10.0000 mg | ORAL_TABLET | Freq: Three times a day (TID) | ORAL | Status: DC
  Administered 2014-03-30: 10 mg via ORAL
  Filled 2014-03-30: qty 1

## 2014-03-30 MED ORDER — ACETAMINOPHEN 325 MG PO TABS: 650.0000 mg | ORAL_TABLET | Freq: Four times a day (QID) | ORAL | Status: DC | PRN

## 2014-03-30 MED ORDER — DEXTROSE-NACL 5-0.45 % IV SOLN: INTRAVENOUS | Status: DC

## 2014-03-30 MED ORDER — SODIUM CHLORIDE 0.9 % IV SOLN
INTRAVENOUS | Status: DC
  Filled 2014-03-30: qty 2.5

## 2014-03-30 MED ORDER — IOHEXOL 300 MG/ML  SOLN
50.0000 mL | Freq: Once | INTRAMUSCULAR | Status: AC | PRN
  Administered 2014-03-30: 50 mL via ORAL

## 2014-03-30 MED ORDER — HYDROMORPHONE HCL 1 MG/ML IJ SOLN
1.0000 mg | Freq: Once | INTRAMUSCULAR | Status: AC
Start: 2014-03-30 — End: 2014-03-30
  Administered 2014-03-30: 1 mg via INTRAVENOUS
  Filled 2014-03-30: qty 1

## 2014-03-30 MED ORDER — HYDRALAZINE HCL 20 MG/ML IJ SOLN: 5.0000 mg | INTRAMUSCULAR | Status: DC | PRN

## 2014-03-30 MED ORDER — PROMETHAZINE HCL 25 MG/ML IJ SOLN
12.5000 mg | Freq: Once | INTRAMUSCULAR | Status: AC
  Administered 2014-03-30: 12.5 mg via INTRAVENOUS
  Filled 2014-03-30: qty 1

## 2014-03-30 MED ORDER — SODIUM CHLORIDE 0.9 % IJ SOLN
INTRAMUSCULAR | Status: AC
  Filled 2014-03-30: qty 60

## 2014-03-30 MED ORDER — HYDROMORPHONE HCL 1 MG/ML IJ SOLN
1.0000 mg | Freq: Once | INTRAMUSCULAR | Status: AC
  Administered 2014-03-30: 1 mg via INTRAVENOUS
  Filled 2014-03-30: qty 1

## 2014-03-30 MED ORDER — LORAZEPAM 2 MG/ML IJ SOLN
0.5000 mg | INTRAMUSCULAR | Status: AC | PRN
  Administered 2014-03-30 – 2014-03-31 (×2): 0.5 mg via INTRAVENOUS
  Filled 2014-03-30 (×2): qty 1

## 2014-03-30 NOTE — ED Notes (Signed)
POC CBG result:[id number]

## 2014-03-30 NOTE — Progress Notes (Signed)
CRITICAL VALUE ALERT  Critical value received:  Lactic Acid 3.3  Date of notification:  03/30/2014  Time of notification: 19:55  Critical value read back: yes  Nurse who received alert:  Stephannie Peters  MD notified (1st page):  Dr. Tylene Fantasia  Time of first page:  19:57  MD notified (2nd page):   Time of second page:  Responding MD:  Dr. Baltazar Najjar  Time MD responded:  20:07

## 2014-03-30 NOTE — ED Notes (Signed)
Pt reports to [facility] station stating now his chest hurts, pt taken to room for EKG, EKG given to EDP. Pt returned to [place].

## 2014-03-30 NOTE — Progress Notes (Signed)
Paged mid level. Pt states he is anxious and cannot sleep. He is requesting medication to help him rest and to decrease anxiety.

## 2014-03-30 NOTE — ED Notes (Signed)
Patient requesting more pain medication. MD aware. No further orders at present.

## 2014-03-30 NOTE — ED Notes (Signed)
Patient called out for nausea and pain medication. Dr Marily Memos on phone with RN. Verbal order for 10 mg Reglan.

## 2014-03-30 NOTE — Progress Notes (Signed)
Patient arrive to unit via wheelchair.

## 2014-03-30 NOTE — H&P (Addendum)
[facility] History and Physical  Brent Daniels [id number] DOB: 11-Feb-1985 DOA: 03/30/2014  Referring physician: Dr Rogene Houston - [facility] PCP: Brent Rim, MD   Chief Complaint: Nausea and vomiting  HPI: Brent Daniels is a 30 y.o. male  Developed nausea and vomiting at 02:00 today. Associated with epigastric pain.. Reports dark brown, nonbilious emesis. Symptoms are constant Reports 26 episodes of emesis since onset. Patient states that these episodes are similar to episodes of gastroparesis he's had in the past. Tried Maalox, tums w/o benefit.  Home CBG 350s in the days leading up to symptoms BM QOD.  Next scheduled appointment with GI specialist Dr. [name] on March 4.  Review of Systems:  Constitutional:  No weight loss, night sweats, Fevers, chills, fatigue.  HEENT: No headaches, Difficulty swallowing,Tooth/dental problems,Sore throat,  No sneezing, itching, ear ache, nasal congestion, post nasal drip,  Cardio-vascular: No chest pain, Orthopnea, PND, swelling in lower extremities, anasarca, dizziness, palpitations  GI: Per HPI  Resp:   No shortness of breath with exertion or at rest. No excess mucus, no productive cough, No non-productive cough, No coughing up of blood.No change in color of mucus.No wheezing.No chest wall deformity  Skin:  no rash or lesions.  GU:  no dysuria, change in color of urine, no urgency or frequency. No flank pain.  Musculoskeletal:   No joint pain or swelling. No decreased range of motion. No back pain.  Psych:  No change in mood or affect. No depression or anxiety. No memory loss.   Past Medical History  Diagnosis Date  . Diabetes mellitus     type 1  . Arthritis   . GERD (gastroesophageal reflux disease)   . PUD (peptic ulcer disease)   . Hypertension   . Heart murmur     valvular stenosis  . Neuropathy   . Diabetic gastroparesis associated with type 1 diabetes mellitus   . S/P arthroscopic knee surgery 07/04/2011  .  Scoliosis 07/11/2012  . Chronic back pain   . Chronic abdominal pain   . Nausea and vomiting     chronic, recurrent  . Acute esophagitis   . Gastroparesis   . Erosive esophagitis 12/23/2013    "severe" per EGD   . Insomnia   . Anxiety   . Hypercholesteremia    Past Surgical History  Procedure Laterality Date  . Tympanostomy tube placement    . Knee arthroscopy  06/30/2011    Procedure: ARTHROSCOPY KNEE;  Surgeon: Carole Civil, MD;  Location: [facility];  Service: Orthopedics;  Laterality: Right;  diagnostic arthroscopy  . Esophagogastroduodenoscopy (egd) with propofol  06/17/2013    Dr. Oneida Alar: two gastric ulcers in fundus, mild antral gastritis, candida esophagitis  . Esophageal biopsy  06/17/2013    Procedure: GASTRIC ULCER AND ANTRAL BIOPSIES; ESOPHAGEAL BRUSHING;  Surgeon: Danie Binder, MD;  Location: [facility];  Service: Endoscopy;;  . Esophagogastroduodenoscopy (egd) with propofol N/A 09/11/2013    Dr. [name]: abnormal hypopharynx, question massively enlarged tonsils, stomach full of food precluded exam, needs EGD for verification of ulcer healing at a later date  . Esophagogastroduodenoscopy (egd) with propofol N/A 12/23/2013    RMR: Severe exudative esophagitis likely predominatly reflux related. Superimposed Candida infection not excluded status post KOH brushing and biopsy. Localized excoriating gastric mucosa most consistant with trauma(vomiting). No evidence of peptic ulcer disease or other gastric/duodenal pathology. I suspect severe inflammation involving the distal esophagus may account  for at least some of patii  . Esophageal biopsy N/A 12/23/2013  Procedure: ESOPHAGEAL BIOPSY;  Surgeon: Daneil Dolin, MD;  Location: [facility];  Service: Endoscopy;  Laterality: N/A;  . Cholecystectomy N/A 01/28/2014    Procedure: LAPAROSCOPIC CHOLECYSTECTOMY;  Surgeon: Jamesetta So, MD;  Location: [facility];  Service: General;  Laterality: N/A;   Social History:  reports that he has been  smoking Cigarettes.  He has a 2.5 pack-year smoking history. He quit smokeless tobacco use about 13 months ago. He reports that he does not drink alcohol or use illicit drugs.  Allergies  Allergen Reactions  . Hydrocodone Hives  . Tramadol Anaphylaxis and Other (See Comments)    Acid Reflux  . Ibuprofen Other (See Comments)    Stomach ulcers  . Naproxen Other (See Comments)    Stomach ulcers  . Sulfa Antibiotics Other (See Comments)    Stomach ulcers  . Morphine And Related Rash    Family History  Problem Relation Age of Onset  . Cancer Father   . Alcohol abuse Father   . Pseudochol deficiency Neg Hx   . Malignant hyperthermia Neg Hx   . Hypotension Neg Hx   . Anesthesia problems Neg Hx   . Colon cancer Neg Hx      Prior to Admission medications   Medication Sig Start Date End Date Taking? Authorizing Provider  fluconazole (DIFLUCAN) 50 MG tablet Take 5 tablets (250 mg total) by mouth daily. Patient not taking: Reported on 03/19/2014 03/10/14   Kathie Dike, MD  gabapentin (NEURONTIN) 800 MG tablet Take 800 mg by mouth 3 (three) times daily.    Historical Provider, MD  insulin NPH-regular Human (NOVOLIN 70/30) (70-30) 100 UNIT/ML injection Inject 20 Units into the skin 2 (two) times daily with a meal. 15 units in the morning and 25 at night. Patient taking differently: Inject 15-25 Units into the skin 2 (two) times daily with a meal. 15 units in the morning and 25 at night. 11/17/13   Kathie Dike, MD  insulin regular (NOVOLIN R,HUMULIN R) 100 units/mL injection Inject 2-10 Units into the skin 3 (three) times daily before meals. Sliding scale.    Historical Provider, MD  lisinopril (PRINIVIL,ZESTRIL) 20 MG tablet Take 20 mg by mouth daily.    Historical Provider, MD  metoCLOPramide (REGLAN) 10 MG tablet Take 1 tablet (10 mg total) by mouth 4 (four) times daily -  before meals and at bedtime. 03/10/14   Kathie Dike, MD  nicotine (NICODERM CQ - DOSED IN MG/24 HOURS) 14 mg/24hr  patch Place 1 patch (14 mg total) onto the skin daily. Patient not taking: Reported on 03/06/2014 02/02/14   Nita Sells, MD  ondansetron (ZOFRAN) 4 MG tablet Take 1 tablet (4 mg total) by mouth 3 (three) times daily with meals. Patient taking differently: Take 4 mg by mouth every 8 (eight) hours as needed for nausea or vomiting.  12/09/13   Orvil Feil, NP  oxyCODONE-acetaminophen (PERCOCET/ROXICET) 5-325 MG per tablet Take 2 tablets by mouth every 4 (four) hours as needed for severe pain. 03/19/14   [name], MD  oxyCODONE-acetaminophen (PERCOCET/ROXICET) 5-325 MG per tablet Take 1-2 tablets by mouth every 4 (four) hours as needed. 03/29/14   Virgel Manifold, MD  pantoprazole (PROTONIX) 40 MG tablet Take 1 tablet (40 mg total) by mouth 2 (two) times daily before a meal. 10/01/13   Kathie Dike, MD  polyethylene glycol (MIRALAX) packet Take 17 g by mouth daily. 03/19/14   [name], MD  pravastatin (PRAVACHOL) 20 MG tablet Take 20 mg by  mouth daily.  09/10/13 09/10/14  Historical Provider, MD  promethazine (PHENERGAN) 25 MG tablet Take 1 tablet (25 mg total) by mouth every 6 (six) hours as needed for nausea or vomiting. 12/09/13   Orvil Feil, NP  tiZANidine (ZANAFLEX) 4 MG tablet Take 1 tablet by mouth every 6 (six) hours as needed for muscle spasms.  07/30/13   Historical Provider, MD   Physical Exam: Filed Vitals:   03/30/14 1600 03/30/14 1612 03/30/14 1700 03/30/14 1833  BP: 194/109 195/107 194/112 189/106  Pulse: 118 101  113  Temp:    98.1 F (36.7 C)  TempSrc:    Oral  Resp:  18  18  Height:      Weight:      SpO2: 98% 100%  96%    Wt Readings from Last 3 Encounters:  03/30/14 86.183 kg (190 lb)  03/29/14 86.183 kg (190 lb)  03/19/14 83.915 kg (185 lb)    General: Anxious and appears to be in pain Eyes:  PERRL, normal lids, irises & conjunctiva ENT: dry mucus membranes Neck:  no LAD, masses or thyromegaly Cardiovascular:  RRR, no m/r/g. No LE edema. Respiratory:   CTA bilaterally, no w/r/r. Normal respiratory effort. Abdomen: Diffusely ttp throughout. Skin:  no rash or induration seen on limited exam Musculoskeletal:  grossly normal tone BUE/BLE Psychiatric:  grossly normal mood and affect, speech fluent and appropriate Neurologic:  grossly non-focal.          Labs on Admission:  Basic Metabolic Panel:  Recent Labs Lab 03/29/14 0959 03/30/14 1355  NA 134* 132*  K 4.6 4.5  CL 99 92*  CO2 31 25  GLUCOSE 239* 420*  BUN 14 14  CREATININE 0.89 1.05  CALCIUM 9.5 9.6   Liver Function Tests:  Recent Labs Lab 03/29/14 0959 03/30/14 1355  AST 23 27  ALT 27 30  ALKPHOS 119* 134*  BILITOT 0.6 1.8*  PROT 7.6 8.5*  ALBUMIN 4.0 4.6    Recent Labs Lab 03/29/14 0959 03/30/14 1355  LIPASE 19 16   No results for input(s): AMMONIA in the last 168 hours. CBC:  Recent Labs Lab 03/29/14 0959 03/30/14 1355  WBC 19.8* 15.5*  NEUTROABS 16.7* 13.2*  HGB 13.2 14.3  HCT 40.0 42.9  MCV 89.7 89.6  PLT 416* 464*   Cardiac Enzymes: No results for input(s): CKTOTAL, CKMB, CKMBINDEX, TROPONINI in the last 168 hours.  BNP (last 3 results) No results for input(s): BNP in the last 8760 hours.  ProBNP (last 3 results) No results for input(s): PROBNP in the last 8760 hours.  CBG:  Recent Labs Lab 03/30/14 1308 03/30/14 1611  GLUCAP 399* 354*    Radiological Exams on Admission: Dg Abd Acute W/chest  03/30/2014   CLINICAL DATA:  29 year old male with mid epigastric pain. Chest and back pain with nausea vomiting for 2-3 days. Initial encounter.  EXAM: ACUTE ABDOMEN SERIES (ABDOMEN 2 VIEW & CHEST 1 VIEW)  COMPARISON:  03/19/2014 and earlier.  FINDINGS: Stable lung volumes. The lungs remain clear. No pneumothorax or pneumoperitoneum.  Stable cholecystectomy clips. Non obstructed bowel gas pattern. Abdominal and pelvic visceral contours are stable and within normal limits. No acute osseous abnormality identified.  IMPRESSION: 1.  Normal bowel  gas pattern, no free air. 2.  No acute cardiopulmonary abnormality.   Electronically Signed   By: Genevie Ann M.D.   On: 03/30/2014 14:40    EKG: Independently reviewed. Sinus. Tachy. No ACS  Assessment/Plan Principal Problem:   Diabetic  gastroparesis associated with type 1 diabetes mellitus Active Problems:   Essential hypertension   Uncontrolled diabetes mellitus   GERD (gastroesophageal reflux disease)   Chronic generalized abdominal pain   Diabetic neuropathy   Leukocytosis   Drug abuse   Tobacco use   HLD (hyperlipidemia)   Nausea, vomiting and abdominal pain: Chronic issue for pt. Likely secondary to diabetic gastroparesis vs opioid/THC abuse/withdrawel vs psychosomatic. Marked elevation in glucose over the last several days per patient report. Reports compliance with home Reglan. Reports bowel movements every other day. Abdominal film without acute process. White count 15.5. Afebrile. Discussed case w/ Dr. [name] and Fields who agree w/ admission and further investigative workup - Admit  - Lactic acid - CT abd if lactic acid elevated - NS 11ml/ht - UDS (THC + at last admission but adamently denies use - Reglan, phenergan, zofran - Morphine 4mg  Q4 - Glucose control as below - Hpylori  DM type 1: out of control glucose 420 on admission w/o DKA. Endorses glucose in mid 300s on a regular basis. A1c 10.6 in January 2016 - SSI  HTN: elevated. Likely from pain - continue lisinorpil - hydralazine PRN  Leukocytosis: 15.5 on admission. Likely stress reaction but workup above to include infectious etiology.   Neuropathic pain:  - continue neurontin  Drug abuse: pt denies drug use. "I'm to ill to do that stuff." Last UDS + for THC on 03/19/14 - UDS  GERD: Continue ppi  Tobacco: 1/2 ppd - nicotine patch  HLD - continue pravastatin   Code Status: FULL DVT Prophylaxis: Hep Family Communication: None Disposition Plan: pending improvement  Sueann Brownley Lenna Sciara, MD Family  [facility] [email or web address] Password [email or web address]

## 2014-03-30 NOTE — ED Notes (Signed)
Pt states that he has been having abd pain with n/v since yesterday.  Was seen yesterday for same thing.

## 2014-03-30 NOTE — ED Provider Notes (Addendum)
CSN: [id number]     Arrival date & time 03/30/14  1036 History  This chart was scribed for Fredia Sorrow, MD by Stephania Fragmin, ED Scribe. This patient was seen in room APA01/APA01 and the patient's care was started at 1:19 PM.     Chief Complaint  Patient presents with  . Abdominal Pain  . Emesis   Patient is a 29 y.o. male presenting with abdominal pain and vomiting. The history is provided by the patient. No language interpreter was used.  Abdominal Pain Pain location:  RUQ Pain radiates to:  Does not radiate Pain severity:  Severe Onset quality:  Sudden Duration:  11 hours Timing:  Constant Progression:  Worsening Chronicity:  Recurrent Relieved by:  None tried Worsened by:  Nothing tried Ineffective treatments:  None tried Associated symptoms: nausea and vomiting   Associated symptoms: no chest pain, no chills, no cough, no diarrhea, no dysuria, no fever, no hematuria and no sore throat   Emesis Associated symptoms: abdominal pain   Associated symptoms: no chills, no diarrhea, no headaches, no myalgias and no sore throat     HPI Comments: Brent Daniels is a 29 y.o. male with a history of IDDM and gastroparesis who presents to the Emergency Department complaining of constant RUQ and epigastric abdominal pain that began yesterday, subsided for a period of time, and returned at 11 PM last night, as well as about 25 episodes of vomiting today since 2 AM. He complains of associated burning chest pain, likely from vomiting. Patient was seen for the same yesterday, and he states that he thinks he needs to be admitted for DKA. Phenergan and Dilaudid usually alleviate his symptoms. Patient denies fevers, chills, visual changes, cough, rhinorrhea, sore throat, SOB, diarrhea, dysuria, hematuria, leg swelling, bleeding easily, myalgia, rash, or headache.  Dr. [name] is patient's gastroenterologist.  Past Medical History  Diagnosis Date  . Diabetes mellitus     type 1  . Arthritis   . GERD  (gastroesophageal reflux disease)   . PUD (peptic ulcer disease)   . Hypertension   . Heart murmur     valvular stenosis  . Neuropathy   . Diabetic gastroparesis associated with type 1 diabetes mellitus   . S/P arthroscopic knee surgery 07/04/2011  . Scoliosis 07/11/2012  . Chronic back pain   . Chronic abdominal pain   . Nausea and vomiting     chronic, recurrent  . Acute esophagitis   . Gastroparesis   . Erosive esophagitis 12/23/2013    "severe" per EGD   . Insomnia   . Anxiety   . Hypercholesteremia    Past Surgical History  Procedure Laterality Date  . Tympanostomy tube placement    . Knee arthroscopy  06/30/2011    Procedure: ARTHROSCOPY KNEE;  Surgeon: Carole Civil, MD;  Location: [facility];  Service: Orthopedics;  Laterality: Right;  diagnostic arthroscopy  . Esophagogastroduodenoscopy (egd) with propofol  06/17/2013    Dr. Oneida Alar: two gastric ulcers in fundus, mild antral gastritis, candida esophagitis  . Esophageal biopsy  06/17/2013    Procedure: GASTRIC ULCER AND ANTRAL BIOPSIES; ESOPHAGEAL BRUSHING;  Surgeon: Danie Binder, MD;  Location: [facility];  Service: Endoscopy;;  . Esophagogastroduodenoscopy (egd) with propofol N/A 09/11/2013    Dr. [name]: abnormal hypopharynx, question massively enlarged tonsils, stomach full of food precluded exam, needs EGD for verification of ulcer healing at a later date  . Esophagogastroduodenoscopy (egd) with propofol N/A 12/23/2013    RMR: Severe exudative esophagitis likely  predominatly reflux related. Superimposed Candida infection not excluded status post KOH brushing and biopsy. Localized excoriating gastric mucosa most consistant with trauma(vomiting). No evidence of peptic ulcer disease or other gastric/duodenal pathology. I suspect severe inflammation involving the distal esophagus may account  for at least some of patii  . Esophageal biopsy N/A 12/23/2013    Procedure: ESOPHAGEAL BIOPSY;  Surgeon: Daneil Dolin, MD;  Location: [facility];  Service: Endoscopy;  Laterality: N/A;  . Cholecystectomy N/A 01/28/2014    Procedure: LAPAROSCOPIC CHOLECYSTECTOMY;  Surgeon: Jamesetta So, MD;  Location: [facility];  Service: General;  Laterality: N/A;   Family History  Problem Relation Age of Onset  . Cancer Father   . Alcohol abuse Father   . Pseudochol deficiency Neg Hx   . Malignant hyperthermia Neg Hx   . Hypotension Neg Hx   . Anesthesia problems Neg Hx   . Colon cancer Neg Hx    History  Substance Use Topics  . Smoking status: Current Some Day Smoker -- 0.25 packs/day for 10 years    Types: Cigarettes  . Smokeless tobacco: Former Systems developer    Quit date: 02/17/2013     Comment: smokes 4 cigarettes a day   . Alcohol Use: No    Review of Systems  Constitutional: Negative for fever and chills.  HENT: Negative for rhinorrhea and sore throat.   Eyes: Negative for visual disturbance.  Respiratory: Negative for cough.   Cardiovascular: Negative for chest pain and leg swelling.  Gastrointestinal: Positive for nausea, vomiting and abdominal pain. Negative for diarrhea.  Genitourinary: Negative for dysuria and hematuria.  Musculoskeletal: Negative for myalgias and back pain.  Skin: Negative for rash.  Neurological: Negative for headaches.  Hematological: Does not bruise/bleed easily.  All other systems reviewed and are negative.     Allergies  Hydrocodone; Tramadol; Ibuprofen; Naproxen; Sulfa antibiotics; and Morphine and related  Home Medications   Prior to Admission medications   Medication Sig Start Date End Date Taking? Authorizing Provider  fluconazole (DIFLUCAN) 50 MG tablet Take 5 tablets (250 mg total) by mouth daily. Patient not taking: Reported on 03/19/2014 03/10/14   Kathie Dike, MD  gabapentin (NEURONTIN) 800 MG tablet Take 800 mg by mouth 3 (three) times daily.    Historical Provider, MD  insulin NPH-regular Human (NOVOLIN 70/30) (70-30) 100 UNIT/ML injection Inject 20 Units into the skin 2 (two) times  daily with a meal. 15 units in the morning and 25 at night. Patient taking differently: Inject 15-25 Units into the skin 2 (two) times daily with a meal. 15 units in the morning and 25 at night. 11/17/13   Kathie Dike, MD  insulin regular (NOVOLIN R,HUMULIN R) 100 units/mL injection Inject 2-10 Units into the skin 3 (three) times daily before meals. Sliding scale.    Historical Provider, MD  lisinopril (PRINIVIL,ZESTRIL) 20 MG tablet Take 20 mg by mouth daily.    Historical Provider, MD  metoCLOPramide (REGLAN) 10 MG tablet Take 1 tablet (10 mg total) by mouth 4 (four) times daily -  before meals and at bedtime. 03/10/14   Kathie Dike, MD  nicotine (NICODERM CQ - DOSED IN MG/24 HOURS) 14 mg/24hr patch Place 1 patch (14 mg total) onto the skin daily. Patient not taking: Reported on 03/06/2014 02/02/14   Nita Sells, MD  ondansetron (ZOFRAN) 4 MG tablet Take 1 tablet (4 mg total) by mouth 3 (three) times daily with meals. Patient taking differently: Take 4 mg by mouth every 8 (eight) hours  as needed for nausea or vomiting.  12/09/13   Orvil Feil, NP  oxyCODONE-acetaminophen (PERCOCET/ROXICET) 5-325 MG per tablet Take 2 tablets by mouth every 4 (four) hours as needed for severe pain. 03/19/14   [name], MD  oxyCODONE-acetaminophen (PERCOCET/ROXICET) 5-325 MG per tablet Take 1-2 tablets by mouth every 4 (four) hours as needed. 03/29/14   Virgel Manifold, MD  pantoprazole (PROTONIX) 40 MG tablet Take 1 tablet (40 mg total) by mouth 2 (two) times daily before a meal. 10/01/13   Kathie Dike, MD  polyethylene glycol (MIRALAX) packet Take 17 g by mouth daily. 03/19/14   [name], MD  pravastatin (PRAVACHOL) 20 MG tablet Take 20 mg by mouth daily.  09/10/13 09/10/14  Historical Provider, MD  promethazine (PHENERGAN) 25 MG tablet Take 1 tablet (25 mg total) by mouth every 6 (six) hours as needed for nausea or vomiting. 12/09/13   Orvil Feil, NP  tiZANidine (ZANAFLEX) 4 MG tablet Take 1 tablet  by mouth every 6 (six) hours as needed for muscle spasms.  07/30/13   Historical Provider, MD   BP 195/107 mmHg  Pulse 101  Temp(Src) 98.2 F (36.8 C) (Oral)  Resp 18  Ht 6\' 2"  (1.88 m)  Wt 190 lb (86.183 kg)  BMI 24.38 kg/m2  SpO2 100% Physical Exam  Constitutional: He is oriented to person, place, and time. He appears well-developed and well-nourished. No distress.  HENT:  Head: Normocephalic and atraumatic.  Mouth/Throat: Oropharynx is clear and moist.  Mucous membranes are moist.  Eyes: Conjunctivae and EOM are normal. Pupils are equal, round, and reactive to light. No scleral icterus.  Neck: Neck supple. No tracheal deviation present.  Cardiovascular: Regular rhythm.  Tachycardia present.   Tachycardic but regular.  Pulmonary/Chest: Effort normal and breath sounds normal. No respiratory distress. He has no wheezes. He has no rales.  Lungs are clear to auscultation.  Abdominal: Soft. Bowel sounds are normal. There is tenderness.  Diffusely tender.  Musculoskeletal: Normal range of motion. He exhibits no edema (No leg swelling.).  Neurological: He is alert and oriented to person, place, and time.  Skin: Skin is warm and dry.  Psychiatric: He has a normal mood and affect. His behavior is normal.  Nursing note and vitals reviewed.   ED Course  Procedures (including critical care time)  DIAGNOSTIC STUDIES: Oxygen Saturation is 100% on room air, normal by my interpretation.    COORDINATION OF CARE: 1:30 PM - Discussed treatment plan with pt at bedside which includes dilaudid, phenergan, and lab tests, and pt agreed to plan.   Labs Review Labs Reviewed  COMPREHENSIVE METABOLIC PANEL - Abnormal; Notable for the following:    Sodium 132 (*)    Chloride 92 (*)    Glucose, Bld 420 (*)    Total Protein 8.5 (*)    Alkaline Phosphatase 134 (*)    Total Bilirubin 1.8 (*)    All other components within normal limits  CBC WITH DIFFERENTIAL/PLATELET - Abnormal; Notable for the  following:    WBC 15.5 (*)    Platelets 464 (*)    Neutrophils Relative % 85 (*)    Neutro Abs 13.2 (*)    Lymphocytes Relative 10 (*)    All other components within normal limits  CBG MONITORING, ED - Abnormal; Notable for the following:    Glucose-Capillary 399 (*)    All other components within normal limits  CBG MONITORING, ED - Abnormal; Notable for the following:    Glucose-Capillary 354 (*)  All other components within normal limits  LIPASE, BLOOD   Results for orders placed or performed during the hospital encounter of 03/30/14  Comprehensive metabolic panel  Result Value Ref Range   Sodium 132 (L) 135 - 145 mmol/L   Potassium 4.5 3.5 - 5.1 mmol/L   Chloride 92 (L) 96 - 112 mmol/L   CO2 25 19 - 32 mmol/L   Glucose, Bld 420 (H) 70 - 99 mg/dL   BUN 14 6 - 23 mg/dL   Creatinine, Ser 1.05 0.50 - 1.35 mg/dL   Calcium 9.6 8.4 - 10.5 mg/dL   Total Protein 8.5 (H) 6.0 - 8.3 g/dL   Albumin 4.6 3.5 - 5.2 g/dL   AST 27 0 - 37 U/L   ALT 30 0 - 53 U/L   Alkaline Phosphatase 134 (H) 39 - 117 U/L   Total Bilirubin 1.8 (H) 0.3 - 1.2 mg/dL   GFR calc non Af Amer >90 >90 mL/min   GFR calc Af Amer >90 >90 mL/min   Anion gap 15 5 - 15  CBC with Differential/Platelet  Result Value Ref Range   WBC 15.5 (H) 4.0 - 10.5 K/uL   RBC 4.79 4.22 - 5.81 MIL/uL   Hemoglobin 14.3 13.0 - 17.0 g/dL   HCT 42.9 39.0 - 52.0 %   MCV 89.6 78.0 - 100.0 fL   MCH 29.9 26.0 - 34.0 pg   MCHC 33.3 30.0 - 36.0 g/dL   RDW 14.1 11.5 - 15.5 %   Platelets 464 (H) 150 - 400 K/uL   Neutrophils Relative % 85 (H) 43 - 77 %   Neutro Abs 13.2 (H) 1.7 - 7.7 K/uL   Lymphocytes Relative 10 (L) 12 - 46 %   Lymphs Abs 1.6 0.7 - 4.0 K/uL   Monocytes Relative 3 3 - 12 %   Monocytes Absolute 0.5 0.1 - 1.0 K/uL   Eosinophils Relative 1 0 - 5 %   Eosinophils Absolute 0.1 0.0 - 0.7 K/uL   Basophils Relative 1 0 - 1 %   Basophils Absolute 0.1 0.0 - 0.1 K/uL  Lipase, blood  Result Value Ref Range   Lipase 16 11 - 59  U/L  CBG monitoring, ED  Result Value Ref Range   Glucose-Capillary 399 (H) 70 - 99 mg/dL  POC CBG, ED  Result Value Ref Range   Glucose-Capillary 354 (H) 70 - 99 mg/dL     Imaging Review Dg Abd Acute W/chest  03/30/2014   CLINICAL DATA:  29 year old male with mid epigastric pain. Chest and back pain with nausea vomiting for 2-3 days. Initial encounter.  EXAM: ACUTE ABDOMEN SERIES (ABDOMEN 2 VIEW & CHEST 1 VIEW)  COMPARISON:  03/19/2014 and earlier.  FINDINGS: Stable lung volumes. The lungs remain clear. No pneumothorax or pneumoperitoneum.  Stable cholecystectomy clips. Non obstructed bowel gas pattern. Abdominal and pelvic visceral contours are stable and within normal limits. No acute osseous abnormality identified.  IMPRESSION: 1.  Normal bowel gas pattern, no free air. 2.  No acute cardiopulmonary abnormality.   Electronically Signed   By: Genevie Ann M.D.   On: 03/30/2014 14:40     EKG Interpretation   Date/Time:  Monday March 30 2014 11:51:36 EST Ventricular Rate:  104 PR Interval:  132 QRS Duration: 90 QT Interval:  350 QTC Calculation: 460 R Axis:   44 Text Interpretation:  Sinus tachycardia Possible Left atrial enlargement  Borderline ECG Abnormal ekg Since last tracing rate slower Confirmed by  MILLER  MD,  BRIAN ([id number]) on 03/30/2014 12:32:48 PM Also confirmed by  Rogene Houston  MD, Vivek Grealish [phone])  on 03/30/2014 1:13:21 PM        CRITICAL CARE Performed by: Fredia Sorrow Total critical care time: 30 Critical care time was exclusive of separately billable procedures and treating other patients. Critical care was necessary to treat or prevent imminent or life-threatening deterioration. Critical care was time spent personally by me on the following activities: development of treatment plan with patient and/or surrogate as well as nursing, discussions with consultants, evaluation of patient's response to treatment, examination of patient, obtaining history from patient or  surrogate, ordering and performing treatments and interventions, ordering and review of laboratory studies, ordering and review of radiographic studies, pulse oximetry and re-evaluation of patient's condition.  MDM   Final diagnoses:  Abdominal pain  Chest pain  Hyperglycemia  Tachycardia  Gastroparesis due to DM   The patient with known chronic pain. Patient also with known problems with gastroparesis. Followed by Dr. Sydell Axon from GI. Primary care doctor is in the [facility] area. Patient seen here last evening for similar symptoms. The vomiting restarted his vomited about 26 times since midnight. Patient with hyperglycemia no evidence of DKA currently. Was persistent with the vomiting. Will need hydration and control of the gastroparesis. Patient received 10 units of IV insulin. Will be started on the glucose stabilizer. Patient's blood sugar has improved but is still in the 300 range. Patient will require admission. Hospitalist consult. Patient's of vomiting and pain treated with 2 mg total of hydromorphone. And has received a total of 25 mg of Phenergan. Some improvement but not resolved. The patient started on the glucose stabilizer to control his blood sugars. This normally requires admission to the stepdown unit.    I personally performed the services described in this documentation, which was scribed in my presence. The recorded information has been reviewed and is accurate.      Fredia Sorrow, MD 03/30/14 1644  Fredia Sorrow, MD 03/30/14 [place], MD 03/30/14 [phone]

## 2014-03-30 NOTE — ED Notes (Signed)
Patient instructing RN how to give pain medications to him. Patient stating he wanted pain medication first, followed by nausea medication. RN instructed patient on order of medications. While pushing medications, patient states that RN should push the medication faster, "it doesn't bother me or sting". Patient again instructed on rate of medication administration.

## 2014-03-30 NOTE — ED Notes (Signed)
Dr. Merrell at bedside. 

## 2014-03-31 ENCOUNTER — Inpatient Hospital Stay (HOSPITAL_COMMUNITY)
Admission: EM | Admit: 2014-03-31 | Discharge: 2014-04-02 | DRG: 638 | Discharge status: Against Medical Advice | Payer: Managed Care, Other (non HMO) | Attending: Internal Medicine | Admitting: Internal Medicine

## 2014-03-31 ENCOUNTER — Emergency Department (HOSPITAL_COMMUNITY): Payer: Managed Care, Other (non HMO)

## 2014-03-31 ENCOUNTER — Encounter (HOSPITAL_COMMUNITY): Payer: Self-pay | Admitting: Emergency Medicine

## 2014-03-31 DIAGNOSIS — M549 Dorsalgia, unspecified: Secondary | ICD-10-CM | POA: Diagnosis present

## 2014-03-31 DIAGNOSIS — R1084 Generalized abdominal pain: Secondary | ICD-10-CM

## 2014-03-31 DIAGNOSIS — F1120 Opioid dependence, uncomplicated: Secondary | ICD-10-CM | POA: Diagnosis present

## 2014-03-31 DIAGNOSIS — I10 Essential (primary) hypertension: Secondary | ICD-10-CM | POA: Diagnosis present

## 2014-03-31 DIAGNOSIS — G8929 Other chronic pain: Secondary | ICD-10-CM | POA: Diagnosis present

## 2014-03-31 DIAGNOSIS — K219 Gastro-esophageal reflux disease without esophagitis: Secondary | ICD-10-CM | POA: Diagnosis present

## 2014-03-31 DIAGNOSIS — Z79899 Other long term (current) drug therapy: Secondary | ICD-10-CM | POA: Diagnosis not present

## 2014-03-31 DIAGNOSIS — Z794 Long term (current) use of insulin: Secondary | ICD-10-CM

## 2014-03-31 DIAGNOSIS — M1990 Unspecified osteoarthritis, unspecified site: Secondary | ICD-10-CM | POA: Diagnosis present

## 2014-03-31 DIAGNOSIS — E1043 Type 1 diabetes mellitus with diabetic autonomic (poly)neuropathy: Secondary | ICD-10-CM | POA: Diagnosis present

## 2014-03-31 DIAGNOSIS — K3184 Gastroparesis: Secondary | ICD-10-CM | POA: Diagnosis not present

## 2014-03-31 DIAGNOSIS — R739 Hyperglycemia, unspecified: Secondary | ICD-10-CM | POA: Diagnosis present

## 2014-03-31 DIAGNOSIS — G629 Polyneuropathy, unspecified: Secondary | ICD-10-CM | POA: Diagnosis present

## 2014-03-31 DIAGNOSIS — F419 Anxiety disorder, unspecified: Secondary | ICD-10-CM | POA: Diagnosis present

## 2014-03-31 DIAGNOSIS — E1140 Type 2 diabetes mellitus with diabetic neuropathy, unspecified: Secondary | ICD-10-CM | POA: Diagnosis present

## 2014-03-31 DIAGNOSIS — E872 Acidosis, unspecified: Secondary | ICD-10-CM | POA: Diagnosis present

## 2014-03-31 DIAGNOSIS — S46812A Strain of other muscles, fascia and tendons at shoulder and upper arm level, left arm, initial encounter: Secondary | ICD-10-CM

## 2014-03-31 DIAGNOSIS — E1010 Type 1 diabetes mellitus with ketoacidosis without coma: Secondary | ICD-10-CM | POA: Diagnosis not present

## 2014-03-31 DIAGNOSIS — E780 Pure hypercholesterolemia: Secondary | ICD-10-CM | POA: Diagnosis present

## 2014-03-31 DIAGNOSIS — E0810 Diabetes mellitus due to underlying condition with ketoacidosis without coma: Secondary | ICD-10-CM

## 2014-03-31 DIAGNOSIS — G4700 Insomnia, unspecified: Secondary | ICD-10-CM | POA: Diagnosis present

## 2014-03-31 DIAGNOSIS — R112 Nausea with vomiting, unspecified: Secondary | ICD-10-CM

## 2014-03-31 LAB — URINALYSIS, ROUTINE W REFLEX MICROSCOPIC
Bilirubin Urine: NEGATIVE
HGB URINE DIPSTICK: NEGATIVE
Leukocytes, UA: NEGATIVE
Nitrite: NEGATIVE
PH: 5.5 (ref 5.0–8.0)
Protein, ur: NEGATIVE mg/dL
SPECIFIC GRAVITY, URINE: 1.02 (ref 1.005–1.030)
Urobilinogen, UA: 0.2 mg/dL (ref 0.0–1.0)

## 2014-03-31 LAB — BASIC METABOLIC PANEL
Anion gap: 15 (ref 5–15)
Anion gap: 11 (ref 5–15)
Anion gap: 10 (ref 5–15)
BUN: 22 mg/dL (ref 6–23)
BUN: 18 mg/dL (ref 6–23)
BUN: 16 mg/dL (ref 6–23)
CALCIUM: 8.7 mg/dL (ref 8.4–10.5)
CALCIUM: 8.7 mg/dL (ref 8.4–10.5)
CHLORIDE: 99 mmol/L (ref 96–112)
CO2: 15 mmol/L — ABNORMAL LOW (ref 19–32)
CO2: 19 mmol/L (ref 19–32)
CO2: 21 mmol/L (ref 19–32)
CREATININE: 1.42 mg/dL — AB (ref 0.50–1.35)
Calcium: 8.9 mg/dL (ref 8.4–10.5)
Chloride: 99 mmol/L (ref 96–112)
Chloride: 102 mmol/L (ref 96–112)
Creatinine, Ser: 1.12 mg/dL (ref 0.50–1.35)
Creatinine, Ser: 0.92 mg/dL (ref 0.50–1.35)
GFR calc Af Amer: 76 mL/min — ABNORMAL LOW (ref >90)
GFR calc non Af Amer: 87 mL/min — ABNORMAL LOW (ref >90)
GFR calc non Af Amer: >90 mL/min (ref >90)
GFR, EST NON AFRICAN AMERICAN: 65 mL/min — AB (ref >90)
GLUCOSE: 174 mg/dL — AB (ref 70–99)
Glucose, Bld: 250 mg/dL — ABNORMAL HIGH (ref 70–99)
Glucose, Bld: 173 mg/dL — ABNORMAL HIGH (ref 70–99)
POTASSIUM: 4.1 mmol/L (ref 3.5–5.1)
POTASSIUM: 4 mmol/L (ref 3.5–5.1)
Potassium: 4.3 mmol/L (ref 3.5–5.1)
SODIUM: 129 mmol/L — AB (ref 135–145)
SODIUM: 129 mmol/L — AB (ref 135–145)
Sodium: 133 mmol/L — ABNORMAL LOW (ref 135–145)

## 2014-03-31 LAB — I-STAT CHEM 8, ED
BUN: 23 mg/dL (ref 6–23)
CALCIUM ION: 1.15 mmol/L (ref 1.12–1.23)
CHLORIDE: 95 mmol/L — AB (ref 96–112)
Creatinine, Ser: 1 mg/dL (ref 0.50–1.35)
Glucose, Bld: 616 mg/dL (ref 70–99)
HEMATOCRIT: 49 % (ref 39.0–52.0)
Hemoglobin: 16.7 g/dL (ref 13.0–17.0)
POTASSIUM: 5.6 mmol/L — AB (ref 3.5–5.1)
Sodium: 127 mmol/L — ABNORMAL LOW (ref 135–145)
TCO2: 8 mmol/L (ref 0–100)

## 2014-03-31 LAB — BLOOD GAS, VENOUS
ACID-BASE DEFICIT: 10.9 mmol/L — AB (ref 0.0–2.0)
Bicarbonate: 14.2 mEq/L — ABNORMAL LOW (ref 20.0–24.0)
O2 Saturation: 92.7 %
PCO2 VEN: 29.6 mmHg — AB (ref 45.0–50.0)
PH VEN: 7.302 — AB (ref 7.250–7.300)
PO2 VEN: 68.7 mmHg — AB (ref 30.0–45.0)
Patient temperature: 37
TCO2: 13.2 mmol/L (ref 0–100)

## 2014-03-31 LAB — RAPID URINE DRUG SCREEN, HOSP PERFORMED
Amphetamines: NOT DETECTED
Barbiturates: NOT DETECTED
Benzodiazepines: NOT DETECTED
Cocaine: NOT DETECTED
OPIATES: NOT DETECTED
Tetrahydrocannabinol: NOT DETECTED

## 2014-03-31 LAB — CBC WITH DIFFERENTIAL/PLATELET
Basophils Absolute: 0.1 10*3/uL (ref 0.0–0.1)
Basophils Relative: 0 % (ref 0–1)
Eosinophils Absolute: 0 10*3/uL (ref 0.0–0.7)
Eosinophils Relative: 0 % (ref 0–5)
HEMATOCRIT: 36.2 % — AB (ref 39.0–52.0)
HEMOGLOBIN: 11.6 g/dL — AB (ref 13.0–17.0)
LYMPHS ABS: 1.1 10*3/uL (ref 0.7–4.0)
Lymphocytes Relative: 4 % — ABNORMAL LOW (ref 12–46)
MCH: 29.7 pg (ref 26.0–34.0)
MCHC: 32 g/dL (ref 30.0–36.0)
MCV: 92.6 fL (ref 78.0–100.0)
MONO ABS: 1.7 10*3/uL — AB (ref 0.1–1.0)
MONOS PCT: 6 % (ref 3–12)
NEUTROS PCT: 91 % — AB (ref 43–77)
Neutro Abs: 27.1 10*3/uL — ABNORMAL HIGH (ref 1.7–7.7)
RBC: 3.91 MIL/uL — AB (ref 4.22–5.81)
RDW: 14.7 % (ref 11.5–15.5)
WBC: 29.9 10*3/uL — ABNORMAL HIGH (ref 4.0–10.5)

## 2014-03-31 LAB — GLUCOSE, CAPILLARY
GLUCOSE-CAPILLARY: 281 mg/dL — AB (ref 70–99)
GLUCOSE-CAPILLARY: 192 mg/dL — AB (ref 70–99)
GLUCOSE-CAPILLARY: 175 mg/dL — AB (ref 70–99)
GLUCOSE-CAPILLARY: 164 mg/dL — AB (ref 70–99)
Glucose-Capillary: 530 mg/dL — ABNORMAL HIGH (ref 70–99)
Glucose-Capillary: 404 mg/dL — ABNORMAL HIGH (ref 70–99)
Glucose-Capillary: 333 mg/dL — ABNORMAL HIGH (ref 70–99)
Glucose-Capillary: 206 mg/dL — ABNORMAL HIGH (ref 70–99)
Glucose-Capillary: 146 mg/dL — ABNORMAL HIGH (ref 70–99)

## 2014-03-31 LAB — COMPREHENSIVE METABOLIC PANEL
ALK PHOS: 129 U/L — AB (ref 39–117)
ALT: 25 U/L (ref 0–53)
AST: 19 U/L (ref 0–37)
Albumin: 4.3 g/dL (ref 3.5–5.2)
BUN: 24 mg/dL — AB (ref 6–23)
CO2: 9 mmol/L — CL (ref 19–32)
Calcium: 9 mg/dL (ref 8.4–10.5)
Chloride: 89 mmol/L — ABNORMAL LOW (ref 96–112)
Creatinine, Ser: 1.62 mg/dL — ABNORMAL HIGH (ref 0.50–1.35)
GFR calc non Af Amer: 56 mL/min — ABNORMAL LOW (ref >90)
GFR, EST AFRICAN AMERICAN: 64 mL/min — AB (ref >90)
Glucose, Bld: 582 mg/dL (ref 70–99)
POTASSIUM: 5.6 mmol/L — AB (ref 3.5–5.1)
Sodium: 126 mmol/L — ABNORMAL LOW (ref 135–145)
TOTAL PROTEIN: 7.8 g/dL (ref 6.0–8.3)
Total Bilirubin: 2.5 mg/dL — ABNORMAL HIGH (ref 0.3–1.2)

## 2014-03-31 LAB — CBG MONITORING, ED
Glucose-Capillary: 522 mg/dL — ABNORMAL HIGH (ref 70–99)
Glucose-Capillary: 539 mg/dL — ABNORMAL HIGH (ref 70–99)

## 2014-03-31 LAB — ETHANOL

## 2014-03-31 LAB — LACTIC ACID, PLASMA: Lactic Acid, Venous: 2.4 mmol/L (ref 0.5–2.0)

## 2014-03-31 LAB — KETONES, QUALITATIVE

## 2014-03-31 LAB — URINE MICROSCOPIC-ADD ON

## 2014-03-31 MED ORDER — SODIUM CHLORIDE 0.9 % IV SOLN: INTRAVENOUS | Status: AC

## 2014-03-31 MED ORDER — HEPARIN SODIUM (PORCINE) 5000 UNIT/ML IJ SOLN
5000.0000 [IU] | Freq: Three times a day (TID) | INTRAMUSCULAR | Status: DC
  Administered 2014-03-31 – 2014-04-01 (×5): 5000 [IU] via SUBCUTANEOUS
  Filled 2014-03-31 (×5): qty 1

## 2014-03-31 MED ORDER — INSULIN ASPART 100 UNIT/ML ~~LOC~~ SOLN
0.0000 [IU] | SUBCUTANEOUS | Status: DC
  Administered 2014-03-31 (×2): 2 [IU] via SUBCUTANEOUS
  Administered 2014-04-01: 9 [IU] via SUBCUTANEOUS
  Administered 2014-04-01: 5 [IU] via SUBCUTANEOUS

## 2014-03-31 MED ORDER — DIPHENHYDRAMINE HCL 50 MG/ML IJ SOLN
25.0000 mg | Freq: Once | INTRAMUSCULAR | Status: AC
  Administered 2014-03-31: 25 mg via INTRAVENOUS
  Filled 2014-03-31: qty 1

## 2014-03-31 MED ORDER — ONDANSETRON HCL 4 MG/2ML IJ SOLN: 4.0000 mg | Freq: Four times a day (QID) | INTRAMUSCULAR | Status: DC

## 2014-03-31 MED ORDER — INSULIN ASPART 100 UNIT/ML ~~LOC~~ SOLN
20.0000 [IU] | Freq: Once | SUBCUTANEOUS | Status: AC
Start: 2014-03-31 — End: 2014-03-31
  Administered 2014-03-31: 20 [IU] via INTRAVENOUS
  Filled 2014-03-31: qty 1

## 2014-03-31 MED ORDER — INSULIN ASPART 100 UNIT/ML ~~LOC~~ SOLN: 0.0000 [IU] | Freq: Three times a day (TID) | SUBCUTANEOUS | Status: DC

## 2014-03-31 MED ORDER — SODIUM CHLORIDE 0.9 % IV SOLN
INTRAVENOUS | Status: DC
  Administered 2014-03-31: 09:00:00 via INTRAVENOUS

## 2014-03-31 MED ORDER — METOCLOPRAMIDE HCL 5 MG/ML IJ SOLN
10.0000 mg | Freq: Once | INTRAMUSCULAR | Status: AC
  Administered 2014-03-31: 10 mg via INTRAVENOUS
  Filled 2014-03-31: qty 2

## 2014-03-31 MED ORDER — INSULIN ASPART 100 UNIT/ML ~~LOC~~ SOLN: 0.0000 [IU] | SUBCUTANEOUS | Status: DC

## 2014-03-31 MED ORDER — HYDROMORPHONE HCL 1 MG/ML IJ SOLN
1.0000 mg | INTRAMUSCULAR | Status: DC | PRN
  Administered 2014-03-31 – 2014-04-02 (×12): 1 mg via INTRAVENOUS
  Filled 2014-03-31 (×12): qty 1

## 2014-03-31 MED ORDER — SODIUM CHLORIDE 0.9 % IV SOLN
1000.0000 mL | Freq: Once | INTRAVENOUS | Status: AC
  Administered 2014-03-31: 1000 mL via INTRAVENOUS

## 2014-03-31 MED ORDER — FENTANYL CITRATE 0.05 MG/ML IJ SOLN
50.0000 ug | Freq: Once | INTRAMUSCULAR | Status: AC
  Administered 2014-03-31: 50 ug via INTRAVENOUS
  Filled 2014-03-31: qty 2

## 2014-03-31 MED ORDER — ONDANSETRON HCL 4 MG/2ML IJ SOLN
4.0000 mg | Freq: Four times a day (QID) | INTRAMUSCULAR | Status: DC | PRN
  Administered 2014-03-31 – 2014-04-02 (×7): 4 mg via INTRAVENOUS
  Filled 2014-03-31 (×8): qty 2

## 2014-03-31 MED ORDER — SODIUM CHLORIDE 0.9 % IV SOLN
INTRAVENOUS | Status: DC
  Administered 2014-03-31: 4.7 [IU]/h via INTRAVENOUS
  Filled 2014-03-31: qty 2.5

## 2014-03-31 MED ORDER — LORAZEPAM 2 MG/ML IJ SOLN
1.0000 mg | INTRAMUSCULAR | Status: DC
  Administered 2014-03-31 (×3): 2 mg via INTRAVENOUS
  Administered 2014-03-31: 1 mg via INTRAVENOUS
  Administered 2014-04-01 – 2014-04-02 (×9): 2 mg via INTRAVENOUS
  Filled 2014-03-31 (×13): qty 1

## 2014-03-31 MED ORDER — FENTANYL CITRATE 0.05 MG/ML IJ SOLN: 50.0000 ug | Freq: Once | INTRAMUSCULAR | Status: DC

## 2014-03-31 MED ORDER — SODIUM CHLORIDE 0.9 % IV SOLN: 1000.0000 mL | INTRAVENOUS | Status: DC

## 2014-03-31 MED ORDER — INSULIN GLARGINE 100 UNIT/ML ~~LOC~~ SOLN
10.0000 [IU] | Freq: Every day | SUBCUTANEOUS | Status: DC
  Administered 2014-03-31: 10 [IU] via SUBCUTANEOUS
  Filled 2014-03-31 (×2): qty 0.1

## 2014-03-31 MED ORDER — MORPHINE SULFATE 2 MG/ML IJ SOLN
1.0000 mg | INTRAMUSCULAR | Status: DC | PRN
  Administered 2014-03-31: 1 mg via INTRAVENOUS
  Filled 2014-03-31: qty 1

## 2014-03-31 MED ORDER — SODIUM CHLORIDE 0.9 % IV SOLN
INTRAVENOUS | Status: DC
  Filled 2014-03-31: qty 2.5

## 2014-03-31 MED ORDER — NICOTINE 14 MG/24HR TD PT24
14.0000 mg | MEDICATED_PATCH | Freq: Every day | TRANSDERMAL | Status: DC
  Administered 2014-03-31 – 2014-04-02 (×3): 14 mg via TRANSDERMAL
  Filled 2014-03-31 (×3): qty 1

## 2014-03-31 MED ORDER — DEXTROSE-NACL 5-0.45 % IV SOLN
INTRAVENOUS | Status: DC
  Administered 2014-03-31 (×2): via INTRAVENOUS

## 2014-03-31 MED ORDER — LORAZEPAM 2 MG/ML IJ SOLN
1.0000 mg | Freq: Four times a day (QID) | INTRAMUSCULAR | Status: DC | PRN
  Filled 2014-03-31: qty 1
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
## 2014-03-31 NOTE — Progress Notes (Signed)
Patient pulled out NG tube and vomiting dark brown emesis. States he cannot stand having the NG tube and it makes him more nauseated. Zofran given per PRN orders, patient's nurse, Mikki Santee, notified.

## 2014-03-31 NOTE — Progress Notes (Signed)
Patient's sister came to visit. Stated that patient buys prescription pain medications off the street. She also states that when he has been sent home with prescription narcotics, he took the entire bottle in one day. Doctor notified. Patient anxious, restless at this time. May need to start CIWA protocol.
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
## 2014-03-31 NOTE — Progress Notes (Signed)
Patient continues to have bouts of nausea, and coffee ground emesis. NGT, 12 fr, placed right nare, by Terence Lux RN. On low-intermittent wall suction. Patient tolerated procedure.

## 2014-03-31 NOTE — Progress Notes (Signed)
CRITICAL VALUE ALERT  Critical value received:  Lactic Acid  Date of notification:  03/30/2014  Time of notification:  22:37  Critical value read back: yes  Nurse who received alert:  Stephannie Peters  MD notified (1st page):  Ernestina Patches  Time of first page:  2300  MD notified (2nd page):  Time of second page:  Responding MD:  Ernestina Patches  Time MD responded:  2302
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
## 2014-03-31 NOTE — Progress Notes (Signed)
Pt knowingly removed iv and stated that he did so. Notified AC, who notified MD. MD stated to leave iv access for now.

## 2014-03-31 NOTE — Progress Notes (Signed)
Pt stated he wanted to leave AMA. Notified Dr. Ernestina Patches. Checked pt iv site and was clean, dry, and intact. Pt was educated on risks of leaving and pt still insisted on signing out AMA.

## 2014-03-31 NOTE — ED Notes (Addendum)
CRITICAL VALUE ALERT  Critical value received:  co2  9  Glucose  582  Date of notification:  2/03/11/14  Time of notification:  0753  Critical value read back: yes  Nurse who received alert:  j Tamarick Kovalcik rn  MD notified (1st page):  knapp  Time of first page:  0754  MD notified (2nd page):  Time of second page:  Responding MD:  I knapp  Time MD responded:  [phone]

## 2014-03-31 NOTE — Progress Notes (Signed)
Pt states he has been feeling better since the last administration of Reglan. Pt has not had any more vomiting episodes since 12:00a.m. Will continue to monitor pt.
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
## 2014-03-31 NOTE — Telephone Encounter (Signed)
APPOINTMENT HAS BEEN MOVED TO A SOONER DATE

## 2014-04-01 DIAGNOSIS — I10 Essential (primary) hypertension: Secondary | ICD-10-CM

## 2014-04-01 LAB — GLUCOSE, CAPILLARY
Glucose-Capillary: 153 mg/dL — ABNORMAL HIGH (ref 70–99)
Glucose-Capillary: 156 mg/dL — ABNORMAL HIGH (ref 70–99)
Glucose-Capillary: 156 mg/dL — ABNORMAL HIGH (ref 70–99)
Glucose-Capillary: 168 mg/dL — ABNORMAL HIGH (ref 70–99)
Glucose-Capillary: 370 mg/dL — ABNORMAL HIGH (ref 70–99)
Glucose-Capillary: 251 mg/dL — ABNORMAL HIGH (ref 70–99)
Glucose-Capillary: 248 mg/dL — ABNORMAL HIGH (ref 70–99)
Glucose-Capillary: 274 mg/dL — ABNORMAL HIGH (ref 70–99)
Glucose-Capillary: 169 mg/dL — ABNORMAL HIGH (ref 70–99)

## 2014-04-01 LAB — BASIC METABOLIC PANEL
ANION GAP: 12 (ref 5–15)
BUN: 15 mg/dL (ref 6–23)
CHLORIDE: 100 mmol/L (ref 96–112)
CO2: 19 mmol/L (ref 19–32)
Calcium: 8.9 mg/dL (ref 8.4–10.5)
Creatinine, Ser: 1 mg/dL (ref 0.50–1.35)
GFR calc non Af Amer: >90 mL/min (ref >90)
Glucose, Bld: 258 mg/dL — ABNORMAL HIGH (ref 70–99)
Potassium: 4 mmol/L (ref 3.5–5.1)
SODIUM: 131 mmol/L — AB (ref 135–145)

## 2014-04-01 MED ORDER — INSULIN ASPART PROT & ASPART (70-30 MIX) 100 UNIT/ML ~~LOC~~ SUSP
20.0000 [IU] | Freq: Two times a day (BID) | SUBCUTANEOUS | Status: DC
  Filled 2014-04-01: qty 10

## 2014-04-01 MED ORDER — SODIUM CHLORIDE 0.9 % IV SOLN
INTRAVENOUS | Status: DC
  Administered 2014-04-01 – 2014-04-02 (×2): via INTRAVENOUS

## 2014-04-01 MED ORDER — INSULIN ASPART PROT & ASPART (70-30 MIX) 100 UNIT/ML ~~LOC~~ SUSP
20.0000 [IU] | Freq: Two times a day (BID) | SUBCUTANEOUS | Status: DC
  Administered 2014-04-01 – 2014-04-02 (×2): 20 [IU] via SUBCUTANEOUS
  Filled 2014-04-01: qty 10

## 2014-04-01 MED ORDER — INSULIN ASPART PROT & ASPART (70-30 MIX) 100 UNIT/ML ~~LOC~~ SUSP
20.0000 [IU] | Freq: Once | SUBCUTANEOUS | Status: AC
  Administered 2014-04-01: 20 [IU] via SUBCUTANEOUS
  Filled 2014-04-01: qty 10
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
## 2014-04-01 NOTE — Progress Notes (Signed)
Patient transferring to room 321. Report given to Janace Aris  RN. Tachycardia persists, otherwise vitals stable.

## 2014-04-01 NOTE — Progress Notes (Addendum)
Inpatient Diabetes Program Recommendations  AACE/ADA: New Consensus Statement on Inpatient Glycemic Control (2013)  Target Ranges:  Prepandial:   less than 140 mg/dL      Peak postprandial:   less than 180 mg/dL (1-2 hours)      Critically ill patients:  140 - 180 mg/dL   Results for Brent Daniels, Brent Daniels (MRN [id number]) as of 04/01/2014 10:31  Ref. Range 03/31/2014 19:32 03/31/2014 20:34 03/31/2014 21:42 03/31/2014 23:42 04/01/2014 04:08 04/01/2014 07:36  Glucose-Capillary Latest Range: 70-99 mg/dL 156 (H) 168 (H) 153 (H) 156 (H) 370 (H) 251 (H)    Diabetes history: DM1 Outpatient Diabetes medications: 70/30 20 units BID, Novolin R 2-10 TID Current orders for Inpatient glycemic control: Lantus 10 QHS, Novolog 0-9 units Q4H  Inpatient Diabetes Program Recommendations Insulin - Basal: Patient was given Lantus 10 units at 19:34 on 2/23 and transitioned off IV insulin to SQ insulin. Patient's CBG has ranged from 153-370 mg/dl and has received a total of Novolog 18 units since being transitioned off IV insulin.Please consider increasing Lantus to 23 units (based on 78 kg x 0.3 units).  Thanks, Barnie Alderman, RN, MSN, CCRN, CDE Diabetes Coordinator Inpatient Diabetes Program [phone] (Team Pager) [phone] ([facility] office) [phone] [facility] office)

## 2014-04-02 LAB — BASIC METABOLIC PANEL
ANION GAP: 15 (ref 5–15)
BUN: 10 mg/dL (ref 6–23)
CALCIUM: 9 mg/dL (ref 8.4–10.5)
CO2: 19 mmol/L (ref 19–32)
Chloride: 101 mmol/L (ref 96–112)
Creatinine, Ser: 0.95 mg/dL (ref 0.50–1.35)
GFR calc Af Amer: >90 mL/min (ref >90)
GFR calc non Af Amer: >90 mL/min (ref >90)
Glucose, Bld: 244 mg/dL — ABNORMAL HIGH (ref 70–99)
Potassium: 3.7 mmol/L (ref 3.5–5.1)
Sodium: 135 mmol/L (ref 135–145)

## 2014-04-02 LAB — CBC
HEMATOCRIT: 37.5 % — AB (ref 39.0–52.0)
Hemoglobin: 12.1 g/dL — ABNORMAL LOW (ref 13.0–17.0)
MCH: 28.9 pg (ref 26.0–34.0)
MCHC: 32.3 g/dL (ref 30.0–36.0)
MCV: 89.7 fL (ref 78.0–100.0)
PLATELETS: 463 10*3/uL — AB (ref 150–400)
RBC: 4.18 MIL/uL — ABNORMAL LOW (ref 4.22–5.81)
RDW: 14.2 % (ref 11.5–15.5)
WBC: 14.8 10*3/uL — ABNORMAL HIGH (ref 4.0–10.5)

## 2014-04-02 LAB — GLUCOSE, CAPILLARY
GLUCOSE-CAPILLARY: 239 mg/dL — AB (ref 70–99)
Glucose-Capillary: 171 mg/dL — ABNORMAL HIGH (ref 70–99)
Glucose-Capillary: 152 mg/dL — ABNORMAL HIGH (ref 70–99)

## 2014-04-02 LAB — POCT I-STAT, CHEM 8
BUN: 23 mg/dL (ref 6–23)
CALCIUM ION: 1.15 mmol/L (ref 1.12–1.23)
Chloride: 95 mmol/L — ABNORMAL LOW (ref 96–112)
Creatinine, Ser: 1 mg/dL (ref 0.50–1.35)
Glucose, Bld: 616 mg/dL (ref 70–99)
HCT: 49 % (ref 39.0–52.0)
Hemoglobin: 16.7 g/dL (ref 13.0–17.0)
Potassium: 5.6 mmol/L — ABNORMAL HIGH (ref 3.5–5.1)
Sodium: 127 mmol/L — ABNORMAL LOW (ref 135–145)
TCO2: 8 mmol/L (ref 0–100)

## 2014-04-02 NOTE — Progress Notes (Signed)
Patient decided to leave AMA. Patient was educated about risk involved with leaving AMA. Risks includes death, seizures, falls with injury, high blood glucose, and nausea and vomiting. After educating, the patient still signed out [place]. Dr. Jerilee Hoh notified. IV removed. Patient walked out.

## 2014-04-02 NOTE — Progress Notes (Signed)
Patient reports continued pain and nausea in spite of receiving pain and nausea medication every 4-6 hours. MD notified and no change in medication ordered. Patient has vomited 4 times this shift; vomitus is mostly clear in basin. However, patient continues to request milk and water to drink. MD notified and diet order was changed to NPO for tonight. Patient continues to ask for pain medicine after 45 minutes of receiving it until it is due again. PRN medication times are written on the board for the patient's information. Patient appears agitated. Charge nurse was also notified and spoke with patient regarding medications. Will continue to monitor.

## 2014-04-02 NOTE — Plan of Care (Signed)
Problem: Consults Goal: Diabetic Ketoacidosis (DKA) Patient Education See Patient Education Modules for education specifics.  Outcome: Not Met (add Reason) Patient left AMA Goal: Diabetes Guidelines if Diabetic/Glucose > 140 If diabetic or lab glucose is > 140 mg/dl - Initiate Diabetes/Hyperglycemia Guidelines & Document Interventions  Outcome: Not Met (add Reason) Patient left AMA  Problem: Phase I Progression Outcomes Goal: Nausea/vomiting controlled with antiemetics Outcome: Not Met (add Reason) Patient left AMA Goal: Pain controlled with appropriate interventions Outcome: Not Met (add Reason) Patient left AMA Goal: Initial discharge plan identified Outcome: Not Met (add Reason) Patient left AMA Goal: Pt. states reason for hospitalization Outcome: Not Met (add Reason) Patient left AMA Goal: Order "Choose to Live" book from Pharmacy Outcome: Not Met (add Reason) Patient left AMA Goal: Other Phase I Outcomes/Goals Outcome: Not Met (add Reason) Patient left AMA  Problem: Phase II Progression Outcomes Goal: CBGs stable on SQ insulin Outcome: Not Met (add Reason) Patient left AMA Goal: Tolerating PO clear liquid diet Outcome: Not Met (add Reason) Patient left AMA Goal: Nausea & vomiting resolved Outcome: Not Met (add Reason) Patient left AMA Goal: Discharge plan established Outcome: Not Met (add Reason) Patient left AMA Goal: Other Phase II Outcomes/Goals Outcome: Not Met (add Reason) Patient left AMA  Problem: Phase III Progression Outcomes Goal: CBGs stable on SQ insulin Outcome: Not Met (add Reason) Patient left AMA Goal: Tolerating PO carb modified diet Outcome: Not Met (add Reason) Patient left AMA Goal: Discharge plan remains appropriate-arrangements made Outcome: Not Met (add Reason) Patient left AMA Goal: Other Phase III Outcomes/Goals Outcome: Not Met (add Reason) Patient left AMA  Problem: Discharge Progression Outcomes Goal: CBGs controlled on DM  discharge meds Outcome: Not Met (add Reason) Patient left AMA Goal: Obtain signed CBG meter Rx form Outcome: Not Met (add Reason) Patient left AMA Goal: Barriers To Progression Addressed/Resolved Outcome: Not Met (add Reason) Patient left AMA Goal: Discharge plan in place and appropriate Outcome: Not Met (add Reason) Patient left AMA Goal: Pain controlled with appropriate interventions Outcome: Not Met (add Reason) Patient left AMA Goal: Hemodynamically stable Outcome: Not Met (add Reason) Patient left AMA Goal: Complications resolved/controlled Outcome: Not Met (add Reason) Patient left AMA Goal: Tolerating diet Outcome: Not Met (add Reason) Patient left AMA Goal: Activity appropriate for discharge plan Outcome: Not Met (add Reason) Patient left AMA Goal: Pt. states knowledge of home DM medications Outcome: Not Met (add Reason) Patient left AMA Goal: Other Discharge Outcomes/Goals Outcome: Not Met (add Reason) Patient left AMA

## 2014-04-02 NOTE — Care Management Note (Signed)
    Page 1 of 1   04/02/2014     10:23:32 AM CARE MANAGEMENT NOTE 04/02/2014  Patient:  Brent Daniels, Brent Daniels   Account Number:  [id number]  Date Initiated:  04/02/2014  Documentation initiated by:  Theophilus Kinds  Subjective/Objective Assessment:   Pt admitted from home with DKA. Pt admitted from home and is independent with ADL's.     Action/Plan:   Pt signed out AMA.   Anticipated DC Date:  04/02/2014   Anticipated DC Plan:  [place]  CM consult      Choice offered to / List presented to:             Status of service:  Completed, signed off Medicare Important Message given?   (If response is "NO", the following Medicare IM given date fields will be blank) Date Medicare IM given:   Medicare IM given by:   Date Additional Medicare IM given:   Additional Medicare IM given by:    Discharge Disposition:  AGAINST MEDICAL ADVICE  Per UR Regulation:    If discussed at Long Length of Stay Meetings, dates discussed:    Comments:  04/02/14 [place], RN BSN CM

## 2014-04-02 NOTE — Discharge Summary (Signed)
Patient decided to leave the hospital AMA before I had assessed him. No prescriptions or follow ups were arranged for him.  Domingo Mend, MD [facility] Pager: [phone]

## 2014-04-02 NOTE — Progress Notes (Signed)
Patient continually tries to convince nursing staff to work outside of scope of practice by asking for medications more frequently than ordered by MD. Patients continues to ask for liquids but continues to vomit on the floor with basin at bedside within reach. Patient appears agitated and is verbally aggressive with staff. Will continue to monitor patient and reiterate MD orders and hospital policy.

## 2014-04-02 NOTE — Progress Notes (Signed)
Patient BP 152/102. Dr Jerilee Hoh notified.

## 2014-04-02 NOTE — Progress Notes (Signed)
Inpatient Diabetes Program Recommendations  AACE/ADA: New Consensus Statement on Inpatient Glycemic Control (2013)  Target Ranges:  Prepandial:   less than 140 mg/dL      Peak postprandial:   less than 180 mg/dL (1-2 hours)      Critically ill patients:  140 - 180 mg/dL   Results for Brent Daniels, Brent Daniels (MRN [id number]) as of 04/02/2014 07:40  Ref. Range 04/01/2014 07:36 04/01/2014 11:11 04/01/2014 16:53 04/01/2014 20:53 04/02/2014 04:37  Glucose-Capillary Latest Range: 70-99 mg/dL 251 (H) 248 (H) 274 (H) 169 (H) 171 (H)    Diabetes history: DM1 Outpatient Diabetes medications: 70/30 20 units BID, Novolin R 2-10 TID Current orders for Inpatient glycemic control: 70/30 20 units BID with meals  Inpatient Diabetes Program Recommendations Correction (SSI): Note patient has been ordered 70/30 20 units BID as taken as an outpatient. Patient still needs correction insulin when CBGs above target glucose levels. Please consider reordering Novolog sensitive correction scale Q4H.  Thanks, Barnie Alderman, RN, MSN, CCRN, CDE Diabetes Coordinator Inpatient Diabetes Program [phone] (Team Pager) [phone] (AP office) [phone] [facility] office)

## 2014-04-02 NOTE — Progress Notes (Signed)
Patient requested to see a MD this morning hoping to be discharged with pain medication. Stated he needed to leave to go to work and would leave AMA, but that he didn't want to. MD notified and stated that day shift MD would assess patient later this morning. Patient then decided to leave AMA. Patient was educated that a responsible party would have to pick him up since he has received pain medication recently. Patient informed sister that he was being discharged instead of leaving AMA. Patient's sister refused to come pick patient up from hospital. As a result, patient decided to stay in the hospital at this time.

## 2014-04-03 ENCOUNTER — Inpatient Hospital Stay (HOSPITAL_COMMUNITY)
Admission: EM | Admit: 2014-04-03 | Discharge: 2014-04-03 | DRG: 638 | Discharge status: Against Medical Advice | Payer: Managed Care, Other (non HMO) | Attending: Internal Medicine | Admitting: Internal Medicine

## 2014-04-03 ENCOUNTER — Encounter (HOSPITAL_COMMUNITY): Payer: Self-pay | Admitting: Emergency Medicine

## 2014-04-03 DIAGNOSIS — R112 Nausea with vomiting, unspecified: Secondary | ICD-10-CM | POA: Diagnosis not present

## 2014-04-03 DIAGNOSIS — G8929 Other chronic pain: Secondary | ICD-10-CM | POA: Diagnosis present

## 2014-04-03 DIAGNOSIS — K210 Gastro-esophageal reflux disease with esophagitis: Secondary | ICD-10-CM | POA: Diagnosis present

## 2014-04-03 DIAGNOSIS — F1120 Opioid dependence, uncomplicated: Secondary | ICD-10-CM | POA: Diagnosis present

## 2014-04-03 DIAGNOSIS — Z794 Long term (current) use of insulin: Secondary | ICD-10-CM

## 2014-04-03 DIAGNOSIS — N185 Chronic kidney disease, stage 5: Secondary | ICD-10-CM

## 2014-04-03 DIAGNOSIS — G4700 Insomnia, unspecified: Secondary | ICD-10-CM | POA: Diagnosis present

## 2014-04-03 DIAGNOSIS — F17210 Nicotine dependence, cigarettes, uncomplicated: Secondary | ICD-10-CM | POA: Diagnosis present

## 2014-04-03 DIAGNOSIS — K219 Gastro-esophageal reflux disease without esophagitis: Secondary | ICD-10-CM | POA: Diagnosis present

## 2014-04-03 DIAGNOSIS — I10 Essential (primary) hypertension: Secondary | ICD-10-CM | POA: Diagnosis present

## 2014-04-03 DIAGNOSIS — F419 Anxiety disorder, unspecified: Secondary | ICD-10-CM | POA: Diagnosis present

## 2014-04-03 DIAGNOSIS — E780 Pure hypercholesterolemia: Secondary | ICD-10-CM | POA: Diagnosis present

## 2014-04-03 DIAGNOSIS — E1122 Type 2 diabetes mellitus with diabetic chronic kidney disease: Secondary | ICD-10-CM

## 2014-04-03 DIAGNOSIS — K3184 Gastroparesis: Secondary | ICD-10-CM | POA: Diagnosis present

## 2014-04-03 DIAGNOSIS — Z7982 Long term (current) use of aspirin: Secondary | ICD-10-CM | POA: Diagnosis not present

## 2014-04-03 DIAGNOSIS — E1110 Type 2 diabetes mellitus with ketoacidosis without coma: Secondary | ICD-10-CM | POA: Diagnosis present

## 2014-04-03 DIAGNOSIS — M1990 Unspecified osteoarthritis, unspecified site: Secondary | ICD-10-CM | POA: Diagnosis present

## 2014-04-03 DIAGNOSIS — E1043 Type 1 diabetes mellitus with diabetic autonomic (poly)neuropathy: Secondary | ICD-10-CM | POA: Diagnosis present

## 2014-04-03 DIAGNOSIS — M549 Dorsalgia, unspecified: Secondary | ICD-10-CM | POA: Diagnosis present

## 2014-04-03 DIAGNOSIS — Z720 Tobacco use: Secondary | ICD-10-CM | POA: Diagnosis present

## 2014-04-03 DIAGNOSIS — E1010 Type 1 diabetes mellitus with ketoacidosis without coma: Principal | ICD-10-CM | POA: Diagnosis present

## 2014-04-03 LAB — CBC WITH DIFFERENTIAL/PLATELET
BASOS PCT: 1 % (ref 0–1)
Basophils Absolute: 0.1 10*3/uL (ref 0.0–0.1)
EOS PCT: 1 % (ref 0–5)
Eosinophils Absolute: 0.1 10*3/uL (ref 0.0–0.7)
HEMATOCRIT: 36.9 % — AB (ref 39.0–52.0)
HEMOGLOBIN: 12.1 g/dL — AB (ref 13.0–17.0)
Lymphocytes Relative: 17 % (ref 12–46)
Lymphs Abs: 1.6 10*3/uL (ref 0.7–4.0)
MCH: 29 pg (ref 26.0–34.0)
MCHC: 32.8 g/dL (ref 30.0–36.0)
MCV: 88.5 fL (ref 78.0–100.0)
MONO ABS: 0.6 10*3/uL (ref 0.1–1.0)
Monocytes Relative: 6 % (ref 3–12)
Neutro Abs: 7.1 10*3/uL (ref 1.7–7.7)
Neutrophils Relative %: 75 % (ref 43–77)
Platelets: 410 10*3/uL — ABNORMAL HIGH (ref 150–400)
RBC: 4.17 MIL/uL — ABNORMAL LOW (ref 4.22–5.81)
RDW: 13.6 % (ref 11.5–15.5)
WBC: 9.3 10*3/uL (ref 4.0–10.5)

## 2014-04-03 LAB — I-STAT CG4 LACTIC ACID, ED: LACTIC ACID, VENOUS: 2.85 mmol/L — AB (ref 0.5–2.0)

## 2014-04-03 LAB — COMPREHENSIVE METABOLIC PANEL
ALBUMIN: 3.4 g/dL — AB (ref 3.5–5.2)
ALT: 16 U/L (ref 0–53)
AST: 15 U/L (ref 0–37)
Alkaline Phosphatase: 108 U/L (ref 39–117)
Anion gap: 16 — ABNORMAL HIGH (ref 5–15)
BUN: 12 mg/dL (ref 6–23)
CO2: 18 mmol/L — ABNORMAL LOW (ref 19–32)
CREATININE: 1.07 mg/dL (ref 0.50–1.35)
Calcium: 8.6 mg/dL (ref 8.4–10.5)
Chloride: 98 mmol/L (ref 96–112)
GFR calc Af Amer: >90 mL/min (ref >90)
GFR calc non Af Amer: >90 mL/min (ref >90)
Glucose, Bld: 415 mg/dL — ABNORMAL HIGH (ref 70–99)
Potassium: 3 mmol/L — ABNORMAL LOW (ref 3.5–5.1)
Sodium: 132 mmol/L — ABNORMAL LOW (ref 135–145)
TOTAL PROTEIN: 6.6 g/dL (ref 6.0–8.3)
Total Bilirubin: 1.2 mg/dL (ref 0.3–1.2)

## 2014-04-03 LAB — CBG MONITORING, ED
GLUCOSE-CAPILLARY: 419 mg/dL — AB (ref 70–99)
Glucose-Capillary: 300 mg/dL — ABNORMAL HIGH (ref 70–99)

## 2014-04-03 LAB — BASIC METABOLIC PANEL
Anion gap: 5 (ref 5–15)
BUN: 10 mg/dL (ref 6–23)
CALCIUM: 8.5 mg/dL (ref 8.4–10.5)
CO2: 24 mmol/L (ref 19–32)
CREATININE: 0.93 mg/dL (ref 0.50–1.35)
Chloride: 105 mmol/L (ref 96–112)
Glucose, Bld: 234 mg/dL — ABNORMAL HIGH (ref 70–99)
POTASSIUM: 3.8 mmol/L (ref 3.5–5.1)
Sodium: 134 mmol/L — ABNORMAL LOW (ref 135–145)

## 2014-04-03 LAB — MRSA PCR SCREENING: MRSA by PCR: NEGATIVE

## 2014-04-03 LAB — LIPASE, BLOOD: LIPASE: 13 U/L (ref 11–59)

## 2014-04-03 MED ORDER — PANTOPRAZOLE SODIUM 40 MG IV SOLR
40.0000 mg | INTRAVENOUS | Status: DC
  Administered 2014-04-03: 40 mg via INTRAVENOUS
  Filled 2014-04-03: qty 40

## 2014-04-03 MED ORDER — HYDROMORPHONE HCL 1 MG/ML IJ SOLN
1.0000 mg | Freq: Once | INTRAMUSCULAR | Status: AC
Start: 2014-04-03 — End: 2014-04-03
  Administered 2014-04-03: 1 mg via INTRAVENOUS
  Filled 2014-04-03: qty 1

## 2014-04-03 MED ORDER — ENOXAPARIN SODIUM 40 MG/0.4ML ~~LOC~~ SOLN
40.0000 mg | SUBCUTANEOUS | Status: DC
  Administered 2014-04-03: 40 mg via SUBCUTANEOUS
  Filled 2014-04-03: qty 0.4

## 2014-04-03 MED ORDER — SODIUM CHLORIDE 0.9 % IV SOLN
INTRAVENOUS | Status: DC
  Administered 2014-04-03: 13:00:00 via INTRAVENOUS

## 2014-04-03 MED ORDER — SODIUM CHLORIDE 0.9 % IV SOLN
INTRAVENOUS | Status: DC
  Filled 2014-04-03: qty 2.5

## 2014-04-03 MED ORDER — SODIUM CHLORIDE 0.9 % IV BOLUS (SEPSIS)
1000.0000 mL | Freq: Once | INTRAVENOUS | Status: AC
  Administered 2014-04-03: 1000 mL via INTRAVENOUS

## 2014-04-03 MED ORDER — POTASSIUM CHLORIDE 10 MEQ/100ML IV SOLN
10.0000 meq | INTRAVENOUS | Status: DC
  Administered 2014-04-03: 10 meq via INTRAVENOUS
  Filled 2014-04-03: qty 100

## 2014-04-03 MED ORDER — HYDRALAZINE HCL 20 MG/ML IJ SOLN
5.0000 mg | Freq: Four times a day (QID) | INTRAMUSCULAR | Status: DC | PRN
  Administered 2014-04-03: 5 mg via INTRAVENOUS
  Filled 2014-04-03: qty 1

## 2014-04-03 MED ORDER — METOCLOPRAMIDE HCL 5 MG/ML IJ SOLN
10.0000 mg | Freq: Four times a day (QID) | INTRAMUSCULAR | Status: DC
  Administered 2014-04-03: 10 mg via INTRAVENOUS
  Filled 2014-04-03: qty 2

## 2014-04-03 MED ORDER — PROMETHAZINE HCL 25 MG/ML IJ SOLN
25.0000 mg | Freq: Once | INTRAMUSCULAR | Status: AC
  Administered 2014-04-03: 25 mg via INTRAVENOUS
  Filled 2014-04-03: qty 1

## 2014-04-03 MED ORDER — SODIUM CHLORIDE 0.9 % IV SOLN
INTRAVENOUS | Status: DC
  Administered 2014-04-03: 14:00:00 via INTRAVENOUS

## 2014-04-03 MED ORDER — DEXTROSE-NACL 5-0.45 % IV SOLN
INTRAVENOUS | Status: DC
  Administered 2014-04-03: 14:00:00 via INTRAVENOUS

## 2014-04-03 MED ORDER — SODIUM CHLORIDE 0.9 % IV SOLN
INTRAVENOUS | Status: DC
  Administered 2014-04-03: 2.4 [IU]/h via INTRAVENOUS
  Filled 2014-04-03: qty 2.5

## 2014-04-03 MED ORDER — POTASSIUM CHLORIDE 10 MEQ/100ML IV SOLN
10.0000 meq | INTRAVENOUS | Status: DC
  Administered 2014-04-03 (×3): 10 meq via INTRAVENOUS
  Filled 2014-04-03: qty 100

## 2014-04-03 MED ORDER — LISINOPRIL 10 MG PO TABS: 20.0000 mg | ORAL_TABLET | Freq: Every day | ORAL | Status: DC

## 2014-04-03 MED ORDER — POTASSIUM CHLORIDE CRYS ER 20 MEQ PO TBCR
40.0000 meq | EXTENDED_RELEASE_TABLET | Freq: Once | ORAL | Status: AC
  Administered 2014-04-03: 40 meq via ORAL
  Filled 2014-04-03: qty 2

## 2014-04-03 NOTE — Care Management Utilization Note (Signed)
UR completed 

## 2014-04-03 NOTE — Progress Notes (Signed)
Pt decided to leave AMA this afternoon. He became agitated and pulled both IVs out when he was told he would not be given narcotics for pain medication. Pt was educated on importance of staying in hospital to get blood sugar and DKA under control. He understands the risks to leaving against medical advice including the risk of death. MD has been notified.

## 2014-04-03 NOTE — Progress Notes (Signed)
Pt transferred to unit from ED. Pt is alert and oriented complaining of "10/10 sharp, shooting" pain in his abdomen. MD has been made aware. VS are stable at HR 118, BP, 145/96, RR 20. Will continue to monitor.

## 2014-04-03 NOTE — ED Notes (Signed)
Insulin gtt verified by C. Khalani Novoa, RN and L. Cardwell, RN.

## 2014-04-03 NOTE — ED Notes (Signed)
Pt transferred to ICU bed #12

## 2014-04-03 NOTE — ED Provider Notes (Signed)
This chart was scribed for [name], DO by Einar Pheasant, ED Scribe. This patient was seen in room APA02/APA02 and the patient's care was started at 12:15 PM.   TIME SEEN: 12:15PM  CHIEF COMPLAINT: Hyperglycemia  HPI:  HPI Comments: MILLAGE PACE is a 29 y.o. male with PMhx of IDDM, DKA and cholecystectomy in December was brought to the Emergency Department complaining by of hyperglycemia. Pt states that he was at home with a blood sugar around 500. He reports associated abdominal pain, nausea, and emesis. Denies diarrhea. Two episodes of emesis endorsed prior to arrival. He denies any fever, chills, SOB, chest pain, weakness, numbness, cough or HA. Was just recently here and admitted for DKA but left when he did not receive pain medication.   ROS: See HPI Constitutional: no fever  Eyes: no drainage  ENT: no runny nose   Cardiovascular:  no chest pain  Resp: no SOB  GI: Positive nausea, vomiting, and abdominal pain.  GU: no dysuria Integumentary: no rash  Allergy: no hives  Musculoskeletal: no leg swelling  Neurological: no slurred speech ROS otherwise negative  PAST MEDICAL HISTORY/PAST SURGICAL HISTORY:  Past Medical History  Diagnosis Date  . Diabetes mellitus     type 1  . Arthritis   . GERD (gastroesophageal reflux disease)   . PUD (peptic ulcer disease)   . Hypertension   . Heart murmur     valvular stenosis  . Neuropathy   . Diabetic gastroparesis associated with type 1 diabetes mellitus   . S/P arthroscopic knee surgery 07/04/2011  . Scoliosis 07/11/2012  . Chronic back pain   . Chronic abdominal pain   . Nausea and vomiting     chronic, recurrent  . Acute esophagitis   . Gastroparesis   . Erosive esophagitis 12/23/2013    "severe" per EGD   . Insomnia   . Anxiety   . Hypercholesteremia     MEDICATIONS:  Prior to Admission medications   Medication Sig Start Date End Date Taking? Authorizing Provider  fluconazole (DIFLUCAN) 50 MG tablet Take 5  tablets (250 mg total) by mouth daily. Patient not taking: Reported on 03/31/2014 03/10/14   Kathie Dike, MD  gabapentin (NEURONTIN) 800 MG tablet Take 800 mg by mouth 3 (three) times daily.    Historical Provider, MD  insulin NPH-regular Human (NOVOLIN 70/30) (70-30) 100 UNIT/ML injection Inject 20 Units into the skin 2 (two) times daily with a meal. 15 units in the morning and 25 at night. Patient taking differently: Inject 15-25 Units into the skin 2 (two) times daily with a meal. 15 units in the morning and 25 at night. 11/17/13   Kathie Dike, MD  insulin regular (NOVOLIN R,HUMULIN R) 100 units/mL injection Inject 2-10 Units into the skin 3 (three) times daily before meals. Sliding scale.    Historical Provider, MD  lisinopril (PRINIVIL,ZESTRIL) 20 MG tablet Take 20 mg by mouth daily.    Historical Provider, MD  metoCLOPramide (REGLAN) 10 MG tablet Take 1 tablet (10 mg total) by mouth 4 (four) times daily -  before meals and at bedtime. 03/10/14   Kathie Dike, MD  nicotine (NICODERM CQ - DOSED IN MG/24 HOURS) 14 mg/24hr patch Place 1 patch (14 mg total) onto the skin daily. Patient not taking: Reported on 03/31/2014 02/02/14   Nita Sells, MD  ondansetron (ZOFRAN) 4 MG tablet Take 1 tablet (4 mg total) by mouth 3 (three) times daily with meals. Patient taking differently: Take 4 mg by  mouth every 8 (eight) hours as needed for nausea or vomiting.  12/09/13   Orvil Feil, NP  oxyCODONE-acetaminophen (PERCOCET/ROXICET) 5-325 MG per tablet Take 2 tablets by mouth every 4 (four) hours as needed for severe pain. Patient not taking: Reported on 03/31/2014 03/19/14   [name], MD  oxyCODONE-acetaminophen (PERCOCET/ROXICET) 5-325 MG per tablet Take 1-2 tablets by mouth every 4 (four) hours as needed. Patient not taking: Reported on 03/31/2014 03/29/14   Virgel Manifold, MD  pantoprazole (PROTONIX) 40 MG tablet Take 1 tablet (40 mg total) by mouth 2 (two) times daily before a meal. 10/01/13    Kathie Dike, MD  polyethylene glycol (MIRALAX) packet Take 17 g by mouth daily. 03/19/14   [name], MD  pravastatin (PRAVACHOL) 20 MG tablet Take 20 mg by mouth daily.  09/10/13 09/10/14  Historical Provider, MD  promethazine (PHENERGAN) 25 MG tablet Take 1 tablet (25 mg total) by mouth every 6 (six) hours as needed for nausea or vomiting. 12/09/13   Orvil Feil, NP    ALLERGIES:  Allergies  Allergen Reactions  . Hydrocodone Hives  . Tramadol Anaphylaxis and Other (See Comments)    Acid Reflux  . Ibuprofen Other (See Comments)    Stomach ulcers  . Naproxen Other (See Comments)    Stomach ulcers  . Sulfa Antibiotics Other (See Comments)    Stomach ulcers  . Morphine And Related Rash    SOCIAL HISTORY:  History  Substance Use Topics  . Smoking status: Current Some Day Smoker -- 0.25 packs/day for 10 years    Types: Cigarettes  . Smokeless tobacco: Former Systems developer    Quit date: 02/17/2013     Comment: smokes 4 cigarettes a day   . Alcohol Use: No    FAMILY HISTORY: Family History  Problem Relation Age of Onset  . Cancer Father   . Alcohol abuse Father   . Pseudochol deficiency Neg Hx   . Malignant hyperthermia Neg Hx   . Hypotension Neg Hx   . Anesthesia problems Neg Hx   . Colon cancer Neg Hx     EXAM: BP 147/96 mmHg  Pulse 128  Temp(Src) 98.1 F (36.7 C) (Oral)  Resp 20  Ht 6\' 2"  (1.88 m)  Wt 190 lb (86.183 kg)  BMI 24.38 kg/m2  SpO2 100% CONSTITUTIONAL: Alert and oriented and responds appropriately to questions. Well-appearing; well-nourished HEAD: Normocephalic EYES: Conjunctivae clear, PERRL ENT: normal nose; no rhinorrhea; dry mucous membranes; pharynx without lesions noted NECK: Supple, no meningismus, no LAD  CARD: Regular and tachycardic; S1 and S2 appreciated; no murmurs, no clicks, no rubs, no gallops RESP: Normal chest excursion without splinting or tachypnea; breath sounds clear and equal bilaterally; no wheezes, no rhonchi, no rales ABD/GI:  Normal bowel sounds; non-distended; soft, diffusely tender to palpation without guarding or rebound BACK:  The back appears normal and is non-tender to palpation, there is no CVA tenderness EXT: Normal ROM in all joints; non-tender to palpation; no edema; normal capillary refill; no cyanosis    SKIN: Normal color for age and race; warm NEURO: Moves all extremities equally, no facial droop or slurred speech PSYCH: The patient's mood and manner are appropriate. Grooming and personal hygiene are appropriate.  MEDICAL DECISION MAKING: Patient here in DKA. Blood glucose in the 400s. Anion gap is 16. Her bicarbonate 18. Potassium is low at 3.0. Will replace. Lactate elevated at 2.85. We'll continue IV hydration and start insulin drip. Discussed with Dr. Roderic Palau for admission to step  down.     CRITICAL CARE Performed by: Nyra Jabs   Total critical care time: 35 minutes  Critical care time was exclusive of separately billable procedures and treating other patients.  Critical care was necessary to treat or prevent imminent or life-threatening deterioration.  Critical care was time spent personally by me on the following activities: development of treatment plan with patient and/or surrogate as well as nursing, discussions with consultants, evaluation of patient's response to treatment, examination of patient, obtaining history from patient or surrogate, ordering and performing treatments and interventions, ordering and review of laboratory studies, ordering and review of radiographic studies, pulse oximetry and re-evaluation of patient's condition.    I personally performed the services described in this documentation, which was scribed in my presence. The recorded information has been reviewed and is accurate.   [place], DO 04/03/14 1547

## 2014-04-03 NOTE — ED Notes (Signed)
Pt received 1000 ml NS bolus en route. Second bag hung.

## 2014-04-03 NOTE — ED Notes (Signed)
EMS called for hyperglycemia, abdominal pain and emesis.

## 2014-04-03 NOTE — Discharge Summary (Signed)
Patient decided to leave the hospital [place]. He was told he was not going to receive any narcotics for his chronic pain during his hospital stay. The patient became very upset and left the hospital [place]. No medications were arranged at the time of discharge. See history and physical from earlier today for further details.  MEMON,JEHANZEB

## 2014-04-03 NOTE — Care Management Note (Signed)
    Page 1 of 1   04/03/2014     2:51:26 PM CARE MANAGEMENT NOTE 04/03/2014  Patient:  Brent Daniels, Brent Daniels   Account Number:  [id number]  Date Initiated:  04/03/2014  Documentation initiated by:  Jolene Provost  Subjective/Objective Assessment:   Pt from home, admitted with DKA. No HH services, DME's or med needs prior to admission. Plans to discharge home. Recently signed out AMA mult times. Will cont to follow.     Action/Plan:   Anticipated DC Date:  04/05/2014   Anticipated DC Plan:  [place]  CM consult      Choice offered to / List presented to:             Status of service:  Completed, signed off Medicare Important Message given?   (If response is "NO", the following Medicare IM given date fields will be blank) Date Medicare IM given:   Medicare IM given by:   Date Additional Medicare IM given:   Additional Medicare IM given by:    Discharge Disposition:  HOME/SELF CARE  Per UR Regulation:  Reviewed for med. necessity/level of care/duration of stay  If discussed at [place] of Stay Meetings, dates discussed:    Comments:  04/03/2014 [place], RN, MSN, CM

## 2014-04-03 NOTE — H&P (Signed)
[facility] History and Physical  Brent Daniels QAS:[id number] DOB: March 04, 1985 DOA: 04/03/2014  Referring physician:  PCP: [name], MD   Chief Complaint: Nausea and vomiting  HPI: Brent Daniels is a 29 y.o. male with past medical history of uncontrolled diabetes frequent DKA admissions status post cholecystectomy, esophagitis left the hospital AMA yesterday presents to the emergency department with chief complaint of persistent nausea vomiting and hyperglycemia. Initial evaluation reveals DKA with an anion gap of 16. Patient reports he went home yesterday "I don't know what happened" this started with emesis. Denies coffee ground emesis. He states it began about 2 AM and he said 2-3 episodes since then. Associated symptoms include abdominal pain. He denies headache visual disturbances fever chills chest pain palpitation shortness of breath cough. Lab work in the emergency department reveals serum glucose of 415 CO2 18 potassium 3.0 sodium 132. He is given 2 L of normal saline in the emergency department and insulin drip is initiated.   Review of Systems:  And point review of systems completed and all systems are negative except as indicated in the history of present illness  Past Medical History  Diagnosis Date  . Diabetes mellitus     type 1  . Arthritis   . GERD (gastroesophageal reflux disease)   . PUD (peptic ulcer disease)   . Hypertension   . Heart murmur     valvular stenosis  . Neuropathy   . Diabetic gastroparesis associated with type 1 diabetes mellitus   . S/P arthroscopic knee surgery 07/04/2011  . Scoliosis 07/11/2012  . Chronic back pain   . Chronic abdominal pain   . Nausea and vomiting     chronic, recurrent  . Acute esophagitis   . Gastroparesis   . Erosive esophagitis 12/23/2013    "severe" per EGD   . Insomnia   . Anxiety   . Hypercholesteremia    Past Surgical History  Procedure Laterality Date  . Tympanostomy tube placement    . Knee  arthroscopy  06/30/2011    Procedure: ARTHROSCOPY KNEE;  Surgeon: Carole Civil, MD;  Location: [facility];  Service: Orthopedics;  Laterality: Right;  diagnostic arthroscopy  . Esophagogastroduodenoscopy (egd) with propofol  06/17/2013    Dr. Oneida Alar: two gastric ulcers in fundus, mild antral gastritis, candida esophagitis  . Esophageal biopsy  06/17/2013    Procedure: GASTRIC ULCER AND ANTRAL BIOPSIES; ESOPHAGEAL BRUSHING;  Surgeon: Danie Binder, MD;  Location: [facility];  Service: Endoscopy;;  . Esophagogastroduodenoscopy (egd) with propofol N/A 09/11/2013    Dr. [name]: abnormal hypopharynx, question massively enlarged tonsils, stomach full of food precluded exam, needs EGD for verification of ulcer healing at a later date  . Esophagogastroduodenoscopy (egd) with propofol N/A 12/23/2013    RMR: Severe exudative esophagitis likely predominatly reflux related. Superimposed Candida infection not excluded status post KOH brushing and biopsy. Localized excoriating gastric mucosa most consistant with trauma(vomiting). No evidence of peptic ulcer disease or other gastric/duodenal pathology. I suspect severe inflammation involving the distal esophagus may account  for at least some of patii  . Esophageal biopsy N/A 12/23/2013    Procedure: ESOPHAGEAL BIOPSY;  Surgeon: Daneil Dolin, MD;  Location: [facility];  Service: Endoscopy;  Laterality: N/A;  . Cholecystectomy N/A 01/28/2014    Procedure: LAPAROSCOPIC CHOLECYSTECTOMY;  Surgeon: Jamesetta So, MD;  Location: [facility];  Service: General;  Laterality: N/A;   Social History:  reports that he has been smoking Cigarettes.  He has a 2.5 pack-year  smoking history. He quit smokeless tobacco use about 13 months ago. He reports that he does not drink alcohol or use illicit drugs.  Allergies  Allergen Reactions  . Hydrocodone Hives  . Tramadol Anaphylaxis and Other (See Comments)    Acid Reflux  . Ibuprofen Other (See Comments)    Stomach ulcers  . Naproxen Other  (See Comments)    Stomach ulcers  . Sulfa Antibiotics Other (See Comments)    Stomach ulcers  . Morphine And Related Rash    Family History  Problem Relation Age of Onset  . Cancer Father   . Alcohol abuse Father   . Pseudochol deficiency Neg Hx   . Malignant hyperthermia Neg Hx   . Hypotension Neg Hx   . Anesthesia problems Neg Hx   . Colon cancer Neg Hx      Prior to Admission medications   Medication Sig Start Date End Date Taking? Authorizing Provider  gabapentin (NEURONTIN) 800 MG tablet Take 800 mg by mouth 3 (three) times daily.   Yes Historical Provider, MD  insulin NPH-regular Human (NOVOLIN 70/30) (70-30) 100 UNIT/ML injection Inject 20 Units into the skin 2 (two) times daily with a meal. 15 units in the morning and 25 at night. Patient taking differently: Inject 15-25 Units into the skin 2 (two) times daily with a meal. 15 units in the morning and 25 at night. 11/17/13  Yes Kathie Dike, MD  insulin regular (NOVOLIN R,HUMULIN R) 100 units/mL injection Inject 2-10 Units into the skin 3 (three) times daily before meals. Sliding scale.   Yes Historical Provider, MD  lisinopril (PRINIVIL,ZESTRIL) 20 MG tablet Take 20 mg by mouth daily.   Yes Historical Provider, MD  metoCLOPramide (REGLAN) 10 MG tablet Take 1 tablet (10 mg total) by mouth 4 (four) times daily -  before meals and at bedtime. 03/10/14  Yes Kathie Dike, MD  ondansetron (ZOFRAN) 4 MG tablet Take 1 tablet (4 mg total) by mouth 3 (three) times daily with meals. Patient taking differently: Take 4 mg by mouth every 8 (eight) hours as needed for nausea or vomiting.  12/09/13  Yes Orvil Feil, NP  pantoprazole (PROTONIX) 40 MG tablet Take 1 tablet (40 mg total) by mouth 2 (two) times daily before a meal. 10/01/13  Yes Kathie Dike, MD  polyethylene glycol (MIRALAX) packet Take 17 g by mouth daily. 03/19/14  Yes [name], MD  pravastatin (PRAVACHOL) 20 MG tablet Take 20 mg by mouth daily.  09/10/13 09/10/14 Yes  Historical Provider, MD  fluconazole (DIFLUCAN) 50 MG tablet Take 5 tablets (250 mg total) by mouth daily. Patient not taking: Reported on 03/31/2014 03/10/14   Kathie Dike, MD  nicotine (NICODERM CQ - DOSED IN MG/24 HOURS) 14 mg/24hr patch Place 1 patch (14 mg total) onto the skin daily. Patient not taking: Reported on 03/31/2014 02/02/14   Nita Sells, MD  oxyCODONE-acetaminophen (PERCOCET/ROXICET) 5-325 MG per tablet Take 2 tablets by mouth every 4 (four) hours as needed for severe pain. Patient not taking: Reported on 03/31/2014 03/19/14   [name], MD  oxyCODONE-acetaminophen (PERCOCET/ROXICET) 5-325 MG per tablet Take 1-2 tablets by mouth every 4 (four) hours as needed. Patient not taking: Reported on 03/31/2014 03/29/14   Virgel Manifold, MD  promethazine (PHENERGAN) 25 MG tablet Take 1 tablet (25 mg total) by mouth every 6 (six) hours as needed for nausea or vomiting. Patient not taking: Reported on 04/03/2014 12/09/13   Orvil Feil, NP   Physical Exam: Danley Danker Vitals:  04/03/14 1121 04/03/14 1130 04/03/14 1200 04/03/14 1230  BP: 147/96 151/97 160/107 168/100  Pulse: 128 122 117 122  Temp: 98.1 F (36.7 C)     TempSrc: Oral     Resp: 20     Height: _0  (1.88 m)     Weight: 86.183 kg (190 lb)     SpO2: 100% 100% 100% 100%    Wt Readings from Last 3 Encounters:  04/03/14 86.183 kg (190 lb)  04/01/14 78.2 kg (172 lb 6.4 oz)  03/29/14 86.183 kg (190 lb)    General:  Appears calm and comfortable Eyes: PERRL, normal lids, irises & conjunctiva ENT: grossly normal hearing, l this membranes of his mouth are pink slightly dry Neck: no LAD, masses or thyromegaly Cardiovascular: Tachycardic but regular, no m/r/g. No LE edema. Respiratory: CTA bilaterally, no w/r/r. Normal respiratory effort. Abdomen: soft, ntnd very very sluggish bowel sounds mild diffuse tenderness to palpation no guarding no rebounding no mass organomegaly noted Skin: no rash or induration seen on limited  exam Musculoskeletal: grossly normal tone BUE/BLE Psychiatric: grossly normal mood and affect, speech fluent and appropriate Neurologic: grossly non-focal.          Labs on Admission:  Basic Metabolic Panel:  Recent Labs Lab 03/31/14 1641 03/31/14 2013 04/01/14 0215 04/02/14 0550 04/03/14 1133  NA 129* 133* 131* 135 132*  K 4.1 4.0 4.0 3.7 3.0*  CL 99 102 100 101 98  CO2 _1 18*  GLUCOSE 174* 173* 258* 244* 415*  BUN _2 CREATININE 1.12 0.92 1.00 0.95 1.07  CALCIUM 8.7 8.9 8.9 9.0 8.6   Liver Function Tests:  Recent Labs Lab 03/29/14 0959 03/30/14 1355 03/31/14 0705 04/03/14 1133  AST _3 ALT _4 ALKPHOS 119* 134* 129* 108  BILITOT 0.6 1.8* 2.5* 1.2  PROT 7.6 8.5* 7.8 6.6  ALBUMIN 4.0 4.6 4.3 3.4*    Recent Labs Lab 03/29/14 0959 03/30/14 1355 04/03/14 1133  LIPASE _5 No results for input(s): AMMONIA in the last 168 hours. CBC:  Recent Labs Lab 03/29/14 0959 03/30/14 1355 03/31/14 0706 03/31/14 0733 04/02/14 0550 04/03/14 1133  WBC 19.8* 15.5* 29.9*  --  14.8* 9.3  NEUTROABS 16.7* 13.2* 27.1*  --   --  7.1  HGB 13.2 14.3 11.6* 16.7  16.7 12.1* 12.1*  HCT 40.0 42.9 36.2* 49.0  49.0 37.5* 36.9*  MCV 89.7 89.6 92.6  --  89.7 88.5  PLT 416* 464* PLATELET CLUMPS NOTED ON SMEAR, COUNT APPEARS INCREASED  --  463* 410*   Cardiac Enzymes: No results for input(s): CKTOTAL, CKMB, CKMBINDEX, TROPONINI in the last 168 hours.  BNP (last 3 results) No results for input(s): BNP in the last 8760 hours.  ProBNP (last 3 results) No results for input(s): PROBNP in the last 8760 hours.  CBG:  Recent Labs Lab 04/01/14 2053 04/02/14 0044 04/02/14 0437 04/02/14 0737 04/03/14 1121  GLUCAP 169* 152* 171* 239* 419*    Radiological Exams on Admission: No results found.  EKG:  Assessment/Plan Principal Problem:   DKA (diabetic ketoacidoses): Patient left hospital yesterday AMA after 1 day admission for  same. Home regimen includes 7030. Will admit to step down on an insulin drip. Will follow protocol with be met every 4 hours vigorous IV fluids transition to D5 half-normal saline when CBGs less than 250 per protocol. Active Problems: Intractable nausea and vomiting: Related to #1 in  the setting of chronic gastroparesis secondary to uncontrolled diabetes. Will provide ice chips only, Zofran as needed and Reglan. Protonix as well    Gastroparesis: See above    Essential hypertension: Home meds include lisinopril. Continue this with when necessary hydralazine.    GERD (gastroesophageal reflux disease): Continue PPI  Esophagitis: Appears to be stable at baseline.  Chronic back pain. Ears stable at baseline. Chart review indicates history of chronic pain. Some concern for drug-seeking behavior. Recommend referral to a pain clinic.     Code Status: full DVT Prophylaxis: Family Communication: none present Disposition Plan: home when ready  Time spent: 65 minutes  [facility] Pager [phone]

## 2014-04-04 LAB — GLUCOSE, CAPILLARY
Glucose-Capillary: 244 mg/dL — ABNORMAL HIGH (ref 70–99)
Glucose-Capillary: 197 mg/dL — ABNORMAL HIGH (ref 70–99)
Glucose-Capillary: 144 mg/dL — ABNORMAL HIGH (ref 70–99)

## 2014-04-10 ENCOUNTER — Encounter: Payer: Self-pay | Admitting: Gastroenterology

## 2014-04-10 ENCOUNTER — Ambulatory Visit (INDEPENDENT_AMBULATORY_CARE_PROVIDER_SITE_OTHER): Payer: Managed Care, Other (non HMO) | Admitting: Gastroenterology

## 2014-04-10 VITALS — BP 128/77 | HR 99 | Temp 97.0°F | Ht 74.0 in | Wt 177.6 lb

## 2014-04-10 DIAGNOSIS — R1011 Right upper quadrant pain: Secondary | ICD-10-CM

## 2014-04-10 MED ORDER — PANTOPRAZOLE SODIUM 40 MG PO TBEC: 40.0000 mg | DELAYED_RELEASE_TABLET | Freq: Two times a day (BID) | ORAL | Status: DC

## 2014-04-10 MED ORDER — FLUCONAZOLE 50 MG PO TABS: 250.0000 mg | ORAL_TABLET | Freq: Every day | ORAL | Status: DC

## 2014-04-10 MED ORDER — OXYCODONE HCL 7.5 MG PO TABS: 1.0000 | ORAL_TABLET | Freq: Three times a day (TID) | ORAL | Status: DC

## 2014-04-10 MED ORDER — PROMETHAZINE HCL 25 MG PO TABS: 25.0000 mg | ORAL_TABLET | Freq: Four times a day (QID) | ORAL | Status: DC | PRN

## 2014-04-10 NOTE — Progress Notes (Signed)
Referring Provider: [name], MD Primary Care Physician:  [name], MD  Primary GI: Dr. [name]   Chief Complaint  Patient presents with  . Abdominal Pain  . Emesis    HPI:   Brent Daniels is a 29 y.o. male presenting today with a history of chronic N/V secondary to diabetic gastroparesis, poorly controlled diabetes, severe reflux esophagitis, PUD, RUQ pain persistent s/p cholecystectomy but CT negative for bile leak. Treatment for oral thrush and possible candida esophagitis during hospitalization early Feb 2016.   Diflucan helped some. Then pain returned. RUQ constant pain. Worse with eating. Ran out of oxycodone, only thing that helps. Has been miserable. Takes every 4-6 hours, sometimes 2 at a time.   Takes Reglan religiously. Zofran. Needs Phenergan and Protonix refilled. N/V under control overall but pain makes him vomit. Feels like someone is driving a stake through his RUQ/chest.   Past Medical History  Diagnosis Date  . Diabetes mellitus     type 1  . Arthritis   . GERD (gastroesophageal reflux disease)   . PUD (peptic ulcer disease)   . Hypertension   . Heart murmur     valvular stenosis  . Neuropathy   . Diabetic gastroparesis associated with type 1 diabetes mellitus   . S/P arthroscopic knee surgery 07/04/2011  . Scoliosis 07/11/2012  . Chronic back pain   . Chronic abdominal pain   . Nausea and vomiting     chronic, recurrent  . Acute esophagitis   . Gastroparesis   . Erosive esophagitis 12/23/2013    "severe" per EGD   . Insomnia   . Anxiety   . Hypercholesteremia     Past Surgical History  Procedure Laterality Date  . Tympanostomy tube placement    . Knee arthroscopy  06/30/2011    Procedure: ARTHROSCOPY KNEE;  Surgeon: Carole Civil, MD;  Location: [facility];  Service: Orthopedics;  Laterality: Right;  diagnostic arthroscopy  . Esophagogastroduodenoscopy (egd) with propofol  06/17/2013    Dr. Oneida Alar: two gastric ulcers in fundus,  mild antral gastritis, candida esophagitis  . Esophageal biopsy  06/17/2013    Procedure: GASTRIC ULCER AND ANTRAL BIOPSIES; ESOPHAGEAL BRUSHING;  Surgeon: Danie Binder, MD;  Location: [facility];  Service: Endoscopy;;  . Esophagogastroduodenoscopy (egd) with propofol N/A 09/11/2013    Dr. [name]: abnormal hypopharynx, question massively enlarged tonsils, stomach full of food precluded exam, needs EGD for verification of ulcer healing at a later date  . Esophagogastroduodenoscopy (egd) with propofol N/A 12/23/2013    RMR: Severe exudative esophagitis likely predominatly reflux related. Superimposed Candida infection not excluded status post KOH brushing and biopsy. Localized excoriating gastric mucosa most consistant with trauma(vomiting). No evidence of peptic ulcer disease or other gastric/duodenal pathology. I suspect severe inflammation involving the distal esophagus may account  for at least some of patii  . Esophageal biopsy N/A 12/23/2013    Procedure: ESOPHAGEAL BIOPSY;  Surgeon: Daneil Dolin, MD;  Location: [facility];  Service: Endoscopy;  Laterality: N/A;  . Cholecystectomy N/A 01/28/2014    Procedure: LAPAROSCOPIC CHOLECYSTECTOMY;  Surgeon: Jamesetta So, MD;  Location: [facility];  Service: General;  Laterality: N/A;    Current Outpatient Prescriptions  Medication Sig Dispense Refill  . gabapentin (NEURONTIN) 800 MG tablet Take 800 mg by mouth 3 (three) times daily.    . insulin NPH-regular Human (NOVOLIN 70/30) (70-30) 100 UNIT/ML injection Inject 20 Units into the skin 2 (two) times daily with a meal. 15  units in the morning and 25 at night. (Patient taking differently: Inject 15-25 Units into the skin 2 (two) times daily with a meal. 15 units in the morning and 25 at night.) 10 mL 11  . insulin regular (NOVOLIN R,HUMULIN R) 100 units/mL injection Inject 2-10 Units into the skin 3 (three) times daily before meals. Sliding scale.    Marland Kitchen lisinopril (PRINIVIL,ZESTRIL) 20 MG tablet Take 20 mg by  mouth daily.    . metoCLOPramide (REGLAN) 10 MG tablet Take 1 tablet (10 mg total) by mouth 4 (four) times daily -  before meals and at bedtime. 90 tablet 1  . ondansetron (ZOFRAN) 4 MG tablet Take 1 tablet (4 mg total) by mouth 3 (three) times daily with meals. (Patient taking differently: Take 4 mg by mouth every 8 (eight) hours as needed for nausea or vomiting. ) 90 tablet 5  . polyethylene glycol (MIRALAX) packet Take 17 g by mouth daily. 14 each 0  . pravastatin (PRAVACHOL) 20 MG tablet Take 20 mg by mouth daily.     . fluconazole (DIFLUCAN) 50 MG tablet Take 5 tablets (250 mg total) by mouth daily. (Patient not taking: Reported on 03/31/2014) 21 tablet 0  . nicotine (NICODERM CQ - DOSED IN MG/24 HOURS) 14 mg/24hr patch Place 1 patch (14 mg total) onto the skin daily. (Patient not taking: Reported on 03/31/2014) 28 patch 0  . oxyCODONE-acetaminophen (PERCOCET/ROXICET) 5-325 MG per tablet Take 2 tablets by mouth every 4 (four) hours as needed for severe pain. (Patient not taking: Reported on 03/31/2014) 15 tablet 0  . oxyCODONE-acetaminophen (PERCOCET/ROXICET) 5-325 MG per tablet Take 1-2 tablets by mouth every 4 (four) hours as needed. (Patient not taking: Reported on 03/31/2014) 12 tablet 0  . pantoprazole (PROTONIX) 40 MG tablet Take 1 tablet (40 mg total) by mouth 2 (two) times daily before a meal. (Patient not taking: Reported on 04/10/2014) 60 tablet 2  . promethazine (PHENERGAN) 25 MG tablet Take 1 tablet (25 mg total) by mouth every 6 (six) hours as needed for nausea or vomiting. (Patient not taking: Reported on 04/10/2014) 40 tablet 1   No current facility-administered medications for this visit.    Allergies as of 04/10/2014 - Review Complete 04/10/2014  Allergen Reaction Noted  . Hydrocodone Hives 08/23/2013  . Tramadol Anaphylaxis and Other (See Comments) 10/14/2013  . Ibuprofen Other (See Comments) 08/14/2011  . Naproxen Other (See Comments) 08/14/2011  . Sulfa antibiotics Other (See  Comments) 08/14/2011  . Morphine and related Rash 01/04/2012    Family History  Problem Relation Age of Onset  . Cancer Father   . Alcohol abuse Father   . Pseudochol deficiency Neg Hx   . Malignant hyperthermia Neg Hx   . Hypotension Neg Hx   . Anesthesia problems Neg Hx   . Colon cancer Neg Hx     History   Social History  . Marital Status: Single    Spouse Name: N/A  . Number of Children: N/A  . Years of Education: 12   Occupational History  . unemployed     trying to get disability   Social History Main Topics  . Smoking status: Current Some Day Smoker -- 0.25 packs/day for 10 years    Types: Cigarettes  . Smokeless tobacco: Former Systems developer    Quit date: 02/17/2013     Comment: smokes 4 cigarettes a day   . Alcohol Use: No  . Drug Use: No  . Sexual Activity: Not on file   Other  Topics Concern  . None   Social History Narrative    Review of Systems: As mentioned in HPI.   Physical Exam: BP 128/77 mmHg  Pulse 99  Temp(Src) 97 F (36.1 C)  Ht 6\' 2"  (1.88 m)  Wt 177 lb 9.6 oz (80.559 kg)  BMI 22.79 kg/m2 General:   Alert and oriented. Flat affect.  Head:  Normocephalic and atraumatic. Eyes:  Conjuctiva clear without scleral icterus. Mouth: oral mucosa pink and moist  Heart:  S1, S2 present without murmurs, rubs, or gallops. Regular rate and rhythm. Abdomen:  +BS, soft,TTP RUQ, upper abdomen, no rebound or guarding. Msk:  Symmetrical without gross deformities. Normal posture. Extremities:  Without edema. Neurologic:  Alert and  oriented x4;  grossly normal neurologically. Psych:  Alert and cooperative.

## 2014-04-10 NOTE — Patient Instructions (Signed)
I have refilled Protonix for twice a day, refilled Phenergan every 8 hours as needed for nausea and vomiting. Continue Reglan 4 times a day. You have a short supply of pain medication, which should only be taken sparingly. We will not be able to provide any additional pain medication.   I have referred you to [facility] for further evaluation and to Pain management here in town.

## 2014-04-14 DIAGNOSIS — R1011 Right upper quadrant pain: Secondary | ICD-10-CM | POA: Insufficient documentation

## 2014-04-14 NOTE — Assessment & Plan Note (Signed)
Chronic, no improvement since cholecystectomy but thorough work-up completed with CT negative for bile leak. Did note treatment with Diflucan for oral thrush and possible candida esophagitis during hospitalization in Feb 2016. Pain worse with eating. N/V under control with Reglan, Zofran, Phenergan, and Protonix. Will empirically treat for candida esophagitis an additional round, refill Phenergan and Protonix, and refer to Pain Management and [facility] for further evaluation of chronic abdominal pain.

## 2014-04-14 NOTE — Progress Notes (Signed)
CC'ED TO PCP 

## 2014-04-15 ENCOUNTER — Other Ambulatory Visit: Payer: Self-pay

## 2014-04-15 DIAGNOSIS — R109 Unspecified abdominal pain: Secondary | ICD-10-CM

## 2014-04-23 ENCOUNTER — Ambulatory Visit: Payer: Managed Care, Other (non HMO) | Admitting: Gastroenterology

## 2014-04-24 ENCOUNTER — Other Ambulatory Visit (HOSPITAL_COMMUNITY): Payer: Self-pay

## 2014-04-24 ENCOUNTER — Inpatient Hospital Stay (HOSPITAL_COMMUNITY)
Admission: EM | Admit: 2014-04-24 | Discharge: 2014-04-24 | DRG: 074 | Discharge status: Against Medical Advice | Payer: Managed Care, Other (non HMO) | Attending: Family Medicine | Admitting: Family Medicine

## 2014-04-24 ENCOUNTER — Encounter (HOSPITAL_COMMUNITY): Payer: Self-pay

## 2014-04-24 ENCOUNTER — Emergency Department (HOSPITAL_COMMUNITY): Payer: Managed Care, Other (non HMO)

## 2014-04-24 DIAGNOSIS — R1084 Generalized abdominal pain: Secondary | ICD-10-CM

## 2014-04-24 DIAGNOSIS — E1122 Type 2 diabetes mellitus with diabetic chronic kidney disease: Secondary | ICD-10-CM

## 2014-04-24 DIAGNOSIS — E1065 Type 1 diabetes mellitus with hyperglycemia: Secondary | ICD-10-CM

## 2014-04-24 DIAGNOSIS — E780 Pure hypercholesterolemia: Secondary | ICD-10-CM | POA: Diagnosis present

## 2014-04-24 DIAGNOSIS — F419 Anxiety disorder, unspecified: Secondary | ICD-10-CM | POA: Diagnosis present

## 2014-04-24 DIAGNOSIS — I10 Essential (primary) hypertension: Secondary | ICD-10-CM | POA: Diagnosis present

## 2014-04-24 DIAGNOSIS — E875 Hyperkalemia: Secondary | ICD-10-CM | POA: Diagnosis present

## 2014-04-24 DIAGNOSIS — Z79899 Other long term (current) drug therapy: Secondary | ICD-10-CM | POA: Diagnosis not present

## 2014-04-24 DIAGNOSIS — R1013 Epigastric pain: Secondary | ICD-10-CM

## 2014-04-24 DIAGNOSIS — M1990 Unspecified osteoarthritis, unspecified site: Secondary | ICD-10-CM | POA: Diagnosis present

## 2014-04-24 DIAGNOSIS — G8929 Other chronic pain: Secondary | ICD-10-CM

## 2014-04-24 DIAGNOSIS — K219 Gastro-esophageal reflux disease without esophagitis: Secondary | ICD-10-CM | POA: Diagnosis present

## 2014-04-24 DIAGNOSIS — Z794 Long term (current) use of insulin: Secondary | ICD-10-CM

## 2014-04-24 DIAGNOSIS — R1110 Vomiting, unspecified: Secondary | ICD-10-CM

## 2014-04-24 DIAGNOSIS — E1043 Type 1 diabetes mellitus with diabetic autonomic (poly)neuropathy: Principal | ICD-10-CM

## 2014-04-24 DIAGNOSIS — F17210 Nicotine dependence, cigarettes, uncomplicated: Secondary | ICD-10-CM | POA: Diagnosis present

## 2014-04-24 DIAGNOSIS — R112 Nausea with vomiting, unspecified: Secondary | ICD-10-CM | POA: Diagnosis present

## 2014-04-24 DIAGNOSIS — K3184 Gastroparesis: Secondary | ICD-10-CM | POA: Diagnosis present

## 2014-04-24 DIAGNOSIS — E1140 Type 2 diabetes mellitus with diabetic neuropathy, unspecified: Secondary | ICD-10-CM | POA: Diagnosis present

## 2014-04-24 DIAGNOSIS — N185 Chronic kidney disease, stage 5: Secondary | ICD-10-CM

## 2014-04-24 DIAGNOSIS — R739 Hyperglycemia, unspecified: Secondary | ICD-10-CM

## 2014-04-24 LAB — GLUCOSE, CAPILLARY
Glucose-Capillary: 132 mg/dL — ABNORMAL HIGH (ref 70–99)
Glucose-Capillary: 124 mg/dL — ABNORMAL HIGH (ref 70–99)

## 2014-04-24 LAB — COMPREHENSIVE METABOLIC PANEL
ALBUMIN: 3.4 g/dL — AB (ref 3.5–5.2)
ALK PHOS: 99 U/L (ref 39–117)
ALT: 17 U/L (ref 0–53)
AST: 18 U/L (ref 0–37)
Anion gap: 9 (ref 5–15)
BUN: 11 mg/dL (ref 6–23)
CO2: 27 mmol/L (ref 19–32)
Calcium: 8.8 mg/dL (ref 8.4–10.5)
Chloride: 95 mmol/L — ABNORMAL LOW (ref 96–112)
Creatinine, Ser: 0.85 mg/dL (ref 0.50–1.35)
GFR calc Af Amer: >90 mL/min (ref >90)
GFR calc non Af Amer: >90 mL/min (ref >90)
Glucose, Bld: 516 mg/dL — ABNORMAL HIGH (ref 70–99)
POTASSIUM: 5.7 mmol/L — AB (ref 3.5–5.1)
Sodium: 131 mmol/L — ABNORMAL LOW (ref 135–145)
TOTAL PROTEIN: 6.6 g/dL (ref 6.0–8.3)
Total Bilirubin: 0.9 mg/dL (ref 0.3–1.2)

## 2014-04-24 LAB — CBC WITH DIFFERENTIAL/PLATELET
Basophils Absolute: 0.1 10*3/uL (ref 0.0–0.1)
Basophils Relative: 1 % (ref 0–1)
EOS PCT: 2 % (ref 0–5)
Eosinophils Absolute: 0.3 10*3/uL (ref 0.0–0.7)
HCT: 39.8 % (ref 39.0–52.0)
HEMOGLOBIN: 12.9 g/dL — AB (ref 13.0–17.0)
LYMPHS ABS: 2.1 10*3/uL (ref 0.7–4.0)
LYMPHS PCT: 19 % (ref 12–46)
MCH: 28.6 pg (ref 26.0–34.0)
MCHC: 32.4 g/dL (ref 30.0–36.0)
MCV: 88.2 fL (ref 78.0–100.0)
MONOS PCT: 5 % (ref 3–12)
Monocytes Absolute: 0.5 10*3/uL (ref 0.1–1.0)
NEUTROS ABS: 7.8 10*3/uL — AB (ref 1.7–7.7)
Neutrophils Relative %: 73 % (ref 43–77)
Platelets: 357 10*3/uL (ref 150–400)
RBC: 4.51 MIL/uL (ref 4.22–5.81)
RDW: 14.5 % (ref 11.5–15.5)
WBC: 10.8 10*3/uL — AB (ref 4.0–10.5)

## 2014-04-24 LAB — BASIC METABOLIC PANEL
ANION GAP: 8 (ref 5–15)
BUN: 10 mg/dL (ref 6–23)
CALCIUM: 8.7 mg/dL (ref 8.4–10.5)
CO2: 29 mmol/L (ref 19–32)
Chloride: 102 mmol/L (ref 96–112)
Creatinine, Ser: 0.72 mg/dL (ref 0.50–1.35)
GFR calc Af Amer: >90 mL/min (ref >90)
Glucose, Bld: 131 mg/dL — ABNORMAL HIGH (ref 70–99)
Potassium: 3.2 mmol/L — ABNORMAL LOW (ref 3.5–5.1)
SODIUM: 139 mmol/L (ref 135–145)

## 2014-04-24 LAB — CBG MONITORING, ED
GLUCOSE-CAPILLARY: 404 mg/dL — AB (ref 70–99)
Glucose-Capillary: 555 mg/dL (ref 70–99)
Glucose-Capillary: 262 mg/dL — ABNORMAL HIGH (ref 70–99)
Glucose-Capillary: 212 mg/dL — ABNORMAL HIGH (ref 70–99)

## 2014-04-24 LAB — POTASSIUM: POTASSIUM: 5.9 mmol/L — AB (ref 3.5–5.1)

## 2014-04-24 LAB — LIPASE, BLOOD: Lipase: 12 U/L (ref 11–59)

## 2014-04-24 MED ORDER — ONDANSETRON HCL 4 MG/2ML IJ SOLN: 4.0000 mg | Freq: Four times a day (QID) | INTRAMUSCULAR | Status: DC | PRN

## 2014-04-24 MED ORDER — SODIUM CHLORIDE 0.9 % IJ SOLN: 3.0000 mL | Freq: Two times a day (BID) | INTRAMUSCULAR | Status: DC

## 2014-04-24 MED ORDER — SODIUM POLYSTYRENE SULFONATE 15 GM/60ML PO SUSP: 15.0000 g | Freq: Once | ORAL | Status: DC

## 2014-04-24 MED ORDER — PRAVASTATIN SODIUM 10 MG PO TABS
20.0000 mg | ORAL_TABLET | Freq: Every day | ORAL | Status: DC
Start: 2014-04-25 — End: 2014-04-25

## 2014-04-24 MED ORDER — OXYCODONE HCL 5 MG PO TABS: 5.0000 mg | ORAL_TABLET | Freq: Four times a day (QID) | ORAL | Status: DC | PRN

## 2014-04-24 MED ORDER — INSULIN REGULAR BOLUS VIA INFUSION
0.0000 [IU] | Freq: Three times a day (TID) | INTRAVENOUS | Status: DC
  Filled 2014-04-24: qty 10

## 2014-04-24 MED ORDER — SODIUM CHLORIDE 0.9 % IV SOLN
INTRAVENOUS | Status: DC
  Filled 2014-04-24: qty 2.5

## 2014-04-24 MED ORDER — SODIUM CHLORIDE 0.9 % IV BOLUS (SEPSIS)
1000.0000 mL | Freq: Once | INTRAVENOUS | Status: AC
  Administered 2014-04-24: 1000 mL via INTRAVENOUS

## 2014-04-24 MED ORDER — SODIUM CHLORIDE 0.9 % IV SOLN
INTRAVENOUS | Status: DC
  Administered 2014-04-24: 2 [IU]/h via INTRAVENOUS
  Filled 2014-04-24: qty 2.5

## 2014-04-24 MED ORDER — GABAPENTIN 400 MG PO CAPS: 800.0000 mg | ORAL_CAPSULE | Freq: Three times a day (TID) | ORAL | Status: DC

## 2014-04-24 MED ORDER — DEXTROSE-NACL 5-0.45 % IV SOLN: INTRAVENOUS | Status: DC

## 2014-04-24 MED ORDER — SODIUM CHLORIDE 0.9 % IV SOLN: INTRAVENOUS | Status: DC

## 2014-04-24 MED ORDER — INSULIN ASPART 100 UNIT/ML ~~LOC~~ SOLN
10.0000 [IU] | Freq: Once | SUBCUTANEOUS | Status: AC
  Administered 2014-04-24: 10 [IU] via SUBCUTANEOUS
  Filled 2014-04-24: qty 1

## 2014-04-24 MED ORDER — HYDROMORPHONE HCL 1 MG/ML IJ SOLN: 1.0000 mg | Freq: Once | INTRAMUSCULAR | Status: DC

## 2014-04-24 MED ORDER — ACETAMINOPHEN 650 MG RE SUPP: 650.0000 mg | Freq: Four times a day (QID) | RECTAL | Status: DC | PRN

## 2014-04-24 MED ORDER — DEXTROSE 50 % IV SOLN: 25.0000 mL | INTRAVENOUS | Status: DC | PRN

## 2014-04-24 MED ORDER — HYDROMORPHONE HCL 1 MG/ML IJ SOLN
0.5000 mg | Freq: Once | INTRAMUSCULAR | Status: AC
  Administered 2014-04-24: 0.5 mg via INTRAVENOUS
  Filled 2014-04-24: qty 1

## 2014-04-24 MED ORDER — ACETAMINOPHEN 325 MG PO TABS
650.0000 mg | ORAL_TABLET | Freq: Four times a day (QID) | ORAL | Status: DC | PRN
  Administered 2014-04-24: 650 mg via ORAL
  Filled 2014-04-24: qty 2

## 2014-04-24 MED ORDER — DEXTROSE-NACL 5-0.45 % IV SOLN
INTRAVENOUS | Status: DC
  Administered 2014-04-24: 17:00:00 via INTRAVENOUS

## 2014-04-24 MED ORDER — METOCLOPRAMIDE HCL 5 MG/ML IJ SOLN
10.0000 mg | Freq: Four times a day (QID) | INTRAMUSCULAR | Status: DC
  Administered 2014-04-24: 10 mg via INTRAVENOUS
  Filled 2014-04-24: qty 2

## 2014-04-24 MED ORDER — PANTOPRAZOLE SODIUM 40 MG PO TBEC: 40.0000 mg | DELAYED_RELEASE_TABLET | Freq: Two times a day (BID) | ORAL | Status: DC

## 2014-04-24 MED ORDER — TIZANIDINE HCL 4 MG PO TABS
4.0000 mg | ORAL_TABLET | Freq: Three times a day (TID) | ORAL | Status: DC | PRN
  Filled 2014-04-24: qty 1

## 2014-04-24 MED ORDER — ONDANSETRON HCL 4 MG PO TABS: 4.0000 mg | ORAL_TABLET | Freq: Four times a day (QID) | ORAL | Status: DC | PRN

## 2014-04-24 MED ORDER — METOCLOPRAMIDE HCL 5 MG/ML IJ SOLN
5.0000 mg | Freq: Once | INTRAMUSCULAR | Status: AC
Start: 2014-04-24 — End: 2014-04-24
  Administered 2014-04-24: 5 mg via INTRAVENOUS
  Filled 2014-04-24: qty 2

## 2014-04-24 MED ORDER — PROMETHAZINE HCL 12.5 MG PO TABS: 25.0000 mg | ORAL_TABLET | Freq: Four times a day (QID) | ORAL | Status: DC | PRN

## 2014-04-24 NOTE — ED Provider Notes (Signed)
CSN: [id number]     Arrival date & time 04/24/14  1200 History  This chart was scribed for [name], MD by Edison Simon, ED Scribe. This patient was seen in room [facility] and the patient's care was started at 12:27 PM.    Chief Complaint  Patient presents with  . Emesis   The history is provided by the patient. No language interpreter was used.    HPI Comments: Brent Daniels is a 29 y.o. male with history of DM, gastroparesis, chronic abdominal pain who presents to the Emergency Department complaining of abdominal pain and vomiting with onset yesterday.He notes he had cholecystectomy in December and occasionally has similar throbbing pain in that area that last for a few days at a time. He states pain radiates to chest and back. He rate pain at 8/10. He states he has not used any medication for pain. He states blood sugar was 460s by EMT and has been taking insulin; he states he took blood sugar this morning and it was 200s. He denies alcohol or drug use. He denies fever or SOB. He states he is moving his bowels.   Patient has recurrent admissions for hyperglycemia and abdominal pain. Has left AMA multiple times for not receiving narcotic pain medications. Has also been seen as an outpatient by GI and has had recent CT imaging.  Past Medical History  Diagnosis Date  . Diabetes mellitus     type 1  . Arthritis   . GERD (gastroesophageal reflux disease)   . PUD (peptic ulcer disease)   . Hypertension   . Heart murmur     valvular stenosis  . Neuropathy   . Diabetic gastroparesis associated with type 1 diabetes mellitus   . S/P arthroscopic knee surgery 07/04/2011  . Scoliosis 07/11/2012  . Chronic back pain   . Chronic abdominal pain   . Nausea and vomiting     chronic, recurrent  . Acute esophagitis   . Gastroparesis   . Erosive esophagitis 12/23/2013    "severe" per EGD   . Insomnia   . Anxiety   . Hypercholesteremia    Past Surgical History  Procedure Laterality  Date  . Tympanostomy tube placement    . Knee arthroscopy  06/30/2011    Procedure: ARTHROSCOPY KNEE;  Surgeon: Carole Civil, MD;  Location: [facility];  Service: Orthopedics;  Laterality: Right;  diagnostic arthroscopy  . Esophagogastroduodenoscopy (egd) with propofol  06/17/2013    Dr. Oneida Alar: two gastric ulcers in fundus, mild antral gastritis, candida esophagitis  . Esophageal biopsy  06/17/2013    Procedure: GASTRIC ULCER AND ANTRAL BIOPSIES; ESOPHAGEAL BRUSHING;  Surgeon: Danie Binder, MD;  Location: [facility];  Service: Endoscopy;;  . Esophagogastroduodenoscopy (egd) with propofol N/A 09/11/2013    Dr. [name]: abnormal hypopharynx, question massively enlarged tonsils, stomach full of food precluded exam, needs EGD for verification of ulcer healing at a later date  . Esophagogastroduodenoscopy (egd) with propofol N/A 12/23/2013    RMR: Severe exudative esophagitis likely predominatly reflux related. Superimposed Candida infection not excluded status post KOH brushing and biopsy. Localized excoriating gastric mucosa most consistant with trauma(vomiting). No evidence of peptic ulcer disease or other gastric/duodenal pathology. I suspect severe inflammation involving the distal esophagus may account  for at least some of patii  . Esophageal biopsy N/A 12/23/2013    Procedure: ESOPHAGEAL BIOPSY;  Surgeon: Daneil Dolin, MD;  Location: [facility];  Service: Endoscopy;  Laterality: N/A;  . Cholecystectomy N/A 01/28/2014  Procedure: LAPAROSCOPIC CHOLECYSTECTOMY;  Surgeon: Jamesetta So, MD;  Location: [facility];  Service: General;  Laterality: N/A;   Family History  Problem Relation Age of Onset  . Cancer Father   . Alcohol abuse Father   . Pseudochol deficiency Neg Hx   . Malignant hyperthermia Neg Hx   . Hypotension Neg Hx   . Anesthesia problems Neg Hx   . Colon cancer Neg Hx    History  Substance Use Topics  . Smoking status: Current Some Day Smoker -- 0.25 packs/day for 10 years    Types:  Cigarettes  . Smokeless tobacco: Former Systems developer    Quit date: 02/17/2013     Comment: smokes 4 cigarettes a day   . Alcohol Use: No    Review of Systems  Constitutional: Negative.  Negative for fever.  Respiratory: Negative.  Negative for chest tightness and shortness of breath.   Cardiovascular: Negative.  Negative for chest pain.  Gastrointestinal: Positive for nausea, vomiting and abdominal pain. Negative for diarrhea.  Genitourinary: Negative.  Negative for dysuria.  Skin: Negative for rash.  Neurological: Negative for headaches.  All other systems reviewed and are negative.     Allergies  Hydrocodone; Tramadol; Ibuprofen; Naproxen; Sulfa antibiotics; and Morphine and related  Home Medications   Prior to Admission medications   Medication Sig Start Date End Date Taking? Authorizing Provider  fluconazole (DIFLUCAN) 50 MG tablet Take 5 tablets (250 mg total) by mouth daily. 04/10/14  Yes Orvil Feil, NP  gabapentin (NEURONTIN) 800 MG tablet Take 800 mg by mouth 3 (three) times daily.   Yes Historical Provider, MD  insulin NPH-regular Human (NOVOLIN 70/30) (70-30) 100 UNIT/ML injection Inject 20 Units into the skin 2 (two) times daily with a meal. 15 units in the morning and 25 at night. Patient taking differently: Inject 15-25 Units into the skin 2 (two) times daily with a meal. 15 units in the morning and 25 at night. 11/17/13  Yes Kathie Dike, MD  insulin regular (NOVOLIN R,HUMULIN R) 100 units/mL injection Inject 2-10 Units into the skin 3 (three) times daily before meals. Sliding scale.   Yes Historical Provider, MD  lisinopril (PRINIVIL,ZESTRIL) 20 MG tablet Take 20 mg by mouth daily.   Yes Historical Provider, MD  metoCLOPramide (REGLAN) 10 MG tablet Take 1 tablet (10 mg total) by mouth 4 (four) times daily -  before meals and at bedtime. 03/10/14  Yes Kathie Dike, MD  ondansetron (ZOFRAN) 4 MG tablet Take 1 tablet (4 mg total) by mouth 3 (three) times daily with  meals. Patient taking differently: Take 4 mg by mouth every 8 (eight) hours as needed for nausea or vomiting.  12/09/13  Yes Orvil Feil, NP  OxyCODONE HCl 7.5 MG TABA Take 1 tablet by mouth every 8 (eight) hours. Patient taking differently: Take 1 tablet by mouth every 6 (six) hours.  04/10/14  Yes Orvil Feil, NP  pantoprazole (PROTONIX) 40 MG tablet Take 1 tablet (40 mg total) by mouth 2 (two) times daily before a meal. 04/10/14  Yes Orvil Feil, NP  polyethylene glycol [facility]) packet Take 17 g by mouth daily. 03/19/14  Yes [name], MD  pravastatin (PRAVACHOL) 20 MG tablet Take 20 mg by mouth daily.  09/10/13 09/10/14 Yes Historical Provider, MD  promethazine (PHENERGAN) 25 MG tablet Take 1 tablet (25 mg total) by mouth every 6 (six) hours as needed for nausea or vomiting. 04/10/14  Yes Orvil Feil, NP  tiZANidine (ZANAFLEX)  4 MG tablet Take 4 mg by mouth every 8 (eight) hours as needed for muscle spasms.  04/07/14  Yes Historical Provider, MD   BP 113/81 mmHg  Pulse 67  Temp(Src) 98.2 F (36.8 C) (Oral)  Resp 18  Ht 6\' 2"  (1.88 m)  Wt 180 lb (81.647 kg)  BMI 23.10 kg/m2  SpO2 100% Physical Exam  Constitutional: He is oriented to person, place, and time. No distress.  HENT:  Head: Normocephalic and atraumatic.  Mucous membranes dry  Eyes: Pupils are equal, round, and reactive to light.  Neck: Neck supple.  Cardiovascular: Normal rate, regular rhythm and normal heart sounds.   No murmur heard. Pulmonary/Chest: Effort normal and breath sounds normal. No respiratory distress. He has no wheezes.  Abdominal: Soft. Bowel sounds are normal. There is tenderness. There is no rebound and no guarding.  Mild epigastric tenderness to palpation without rebound or guarding, laparoscopic well-healed incisions in the right upper quadrant  Musculoskeletal: He exhibits no edema.  Neurological: He is alert and oriented to person, place, and time.  Skin: Skin is warm and dry.  Psychiatric: He has a  normal mood and affect.  Nursing note and vitals reviewed.   ED Course  Procedures (including critical care time)  CRITICAL CARE Performed by: Merryl Hacker   Total critical care time: 35 min  Critical care time was exclusive of separately billable procedures and treating other patients.  Critical care was necessary to treat or prevent imminent or life-threatening deterioration.  Critical care was time spent personally by me on the following activities: development of treatment plan with patient and/or surrogate as well as nursing, discussions with consultants, evaluation of patient's response to treatment, examination of patient, obtaining history from patient or surrogate, ordering and performing treatments and interventions, ordering and review of laboratory studies, ordering and review of radiographic studies, pulse oximetry and re-evaluation of patient's condition.   DIAGNOSTIC STUDIES: Oxygen Saturation is 100% on room air, normal by my interpretation.    COORDINATION OF CARE: 12:30 PM Discussed treatment plan with patient at beside, the patient agrees with the plan and has no further questions at this time.   Labs Review Labs Reviewed  CBC WITH DIFFERENTIAL/PLATELET - Abnormal; Notable for the following:    WBC 10.8 (*)    Hemoglobin 12.9 (*)    Neutro Abs 7.8 (*)    All other components within normal limits  COMPREHENSIVE METABOLIC PANEL - Abnormal; Notable for the following:    Sodium 131 (*)    Potassium 5.7 (*)    Chloride 95 (*)    Glucose, Bld 516 (*)    Albumin 3.4 (*)    All other components within normal limits  POTASSIUM - Abnormal; Notable for the following:    Potassium 5.9 (*)    All other components within normal limits  CBG MONITORING, ED - Abnormal; Notable for the following:    Glucose-Capillary 555 (*)    All other components within normal limits  CBG MONITORING, ED - Abnormal; Notable for the following:    Glucose-Capillary 404 (*)     All other components within normal limits  LIPASE, BLOOD    Imaging Review Dg Abd 1 View  04/24/2014   CLINICAL DATA:  Right upper quadrant pain, nausea/vomiting  EXAM: ABDOMEN - 1 VIEW  COMPARISON:  CT abdomen pelvis dated 03/30/2014  FINDINGS: Nonobstructive bowel gas pattern.  Cholecystectomy clips.  Visualized osseous structures are within normal limits.  IMPRESSION: Unremarkable abdominal radiograph.  Electronically Signed   By: Julian Hy M.D.   On: 04/24/2014 13:54     EKG Interpretation None      MDM   Final diagnoses:  Hyperglycemia  Epigastric pain    Patient presents with hyperglycemia and abdominal pain. Nontoxic on exam and initial vital signs are reassuring. History of chronic pain and DKA. Patient given fluids and insulin. Initial CBG 555.  Patient given one dose of narcotic pain medication while waiting for lab work. Lab work notable for sodium of 131, potassium 5.7, glucose of 516. Normal lipase and LFTs. Normal KUB. Suspect chronic gastroparesis as patient's source of abdominal pain. Discuss with patient that he would not be receiving any further narcotic pain medications. Following 2 L of fluid and one dose of 10 units of insulin, repeat glucose 404. Will give a third liter of fluids.  Patient does not appear indicated given persistent hyperglycemia, will admit for further management. Repeat potassium sent.  In discussion with hospitalist, will initiate glucose stabilizer.  I personally performed the services described in this documentation, which was scribed in my presence. The recorded information has been reviewed and is accurate.   Merryl Hacker, MD 04/24/14 [phone]

## 2014-04-24 NOTE — Progress Notes (Signed)
Walked in room the patient had pulled all his leads off and gown.  Patient had called someone to pick him up and requested to leave.  I had informed of the risk and stated he had about 10 more minutes and I could him his Zanaflex.  Patient still wanted to leave and had another nurse take out his IV while I went to get Against Medical Advice papers.  The Nurse and Nurse Tech step out a minute and patient then bolted down the steps to an exit where ride was waiting he was followed and security alerted with no success at reaching him.  He had remove his own access and did not sign the papers. Dr. Susanne Borders and House Supervisor T. Goins notified as well.

## 2014-04-24 NOTE — ED Notes (Signed)
Attempted to call report, nurse to call back. 

## 2014-04-24 NOTE — ED Notes (Signed)
CBG done, Critical value noted to be 555. MD notified.

## 2014-04-24 NOTE — Progress Notes (Signed)
Patient requesting stronger medication explained from Dr. Sarajane Jews.  Patient took Tylenol 650 mg by mouth and Reglan 10 mg by intravenous to help with abdominal pain.  Patient educated about we had to let medication take it course per doctor's orders and would check in 45 minutes and if he was still feeling bad would give his other as needed medications.  Patient asked what his other medications were and then complained of all issues after he knew what he could get them for.  Again told the patient if the Tylenol did not work after 45 minutes would give him his Zanaflex.

## 2014-04-24 NOTE — ED Provider Notes (Signed)
Date: 04/24/2014  Rate: 88  Rhythm: normal sinus rhythm  QRS Axis: normal  Intervals: normal  ST/T Wave abnormalities: normal  Conduction Disutrbances: none  Narrative Interpretation: unremarkable     Nat Christen, MD 04/24/14 1526

## 2014-04-24 NOTE — H&P (Addendum)
History and Physical  Brent Daniels [id number] DOB: April 25, 1985 DOA: 04/24/2014  Referring physician: Dr. Dina Rich PCP: [name], MD  Preferred pain management Dr. Paralee Cancel (was to see today) Has appointment with [facility] on 3/30.  Chief Complaint: Abdominal pain  HPI:  29 year old man with chronic abdominal pain, status post cholecystectomy last year without improvement, status post extensive workup, follows with gastroenterology as an outpatient who presented with acute onset abdominal pain today associated nausea or vomiting. Reports compliance with insulin. He does report having run out of oral narcotics 3 days ago. He reports having had a pain management appointment today however he missed this because he became ill. He reports with compliance with all his medications.  He was feeling well until the last 24 hours. He has acute on chronic abdominal pain which is located in the right upper quadrant. No specific aggravating or alleviating factors noted. He said some nausea and vomiting. He has not had anything to eat today. He was noted to have significant hyperglycemia on transport to the emergency department. Review of his chart is notable for multiple instances of leaving AMA when he did not receive narcotics.  In the emergency department afebrile, vital signs stable. Complete metabolic panel unremarkable except for potassium of 5.7. CBC with modest leukocytosis. Hemoglobin stable 12.9. Glucose 516. Abdominal x-ray unremarkable.  Review of Systems:  Negative for fever, visual changes, sore throat, rash, new muscle aches, chest pain, SOB, dysuria, bleeding  Past Medical History  Diagnosis Date  . Diabetes mellitus     type 1  . Arthritis   . GERD (gastroesophageal reflux disease)   . PUD (peptic ulcer disease)   . Hypertension   . Heart murmur     valvular stenosis  . Neuropathy   . Diabetic gastroparesis associated with type 1 diabetes mellitus   . S/P arthroscopic  knee surgery 07/04/2011  . Scoliosis 07/11/2012  . Chronic back pain   . Chronic abdominal pain   . Nausea and vomiting     chronic, recurrent  . Acute esophagitis   . Gastroparesis   . Erosive esophagitis 12/23/2013    "severe" per EGD   . Insomnia   . Anxiety   . Hypercholesteremia     Past Surgical History  Procedure Laterality Date  . Tympanostomy tube placement    . Knee arthroscopy  06/30/2011    Procedure: ARTHROSCOPY KNEE;  Surgeon: Carole Civil, MD;  Location: [facility];  Service: Orthopedics;  Laterality: Right;  diagnostic arthroscopy  . Esophagogastroduodenoscopy (egd) with propofol  06/17/2013    Dr. Oneida Alar: two gastric ulcers in fundus, mild antral gastritis, candida esophagitis  . Esophageal biopsy  06/17/2013    Procedure: GASTRIC ULCER AND ANTRAL BIOPSIES; ESOPHAGEAL BRUSHING;  Surgeon: Danie Binder, MD;  Location: [facility];  Service: Endoscopy;;  . Esophagogastroduodenoscopy (egd) with propofol N/A 09/11/2013    Dr. [name]: abnormal hypopharynx, question massively enlarged tonsils, stomach full of food precluded exam, needs EGD for verification of ulcer healing at a later date  . Esophagogastroduodenoscopy (egd) with propofol N/A 12/23/2013    RMR: Severe exudative esophagitis likely predominatly reflux related. Superimposed Candida infection not excluded status post KOH brushing and biopsy. Localized excoriating gastric mucosa most consistant with trauma(vomiting). No evidence of peptic ulcer disease or other gastric/duodenal pathology. I suspect severe inflammation involving the distal esophagus may account  for at least some of patii  . Esophageal biopsy N/A 12/23/2013    Procedure: ESOPHAGEAL BIOPSY;  Surgeon: Cristopher Estimable  Rourk, MD;  Location: [facility];  Service: Endoscopy;  Laterality: N/A;  . Cholecystectomy N/A 01/28/2014    Procedure: LAPAROSCOPIC CHOLECYSTECTOMY;  Surgeon: Jamesetta So, MD;  Location: [facility];  Service: General;  Laterality: N/A;    Social History:   reports that he has been smoking Cigarettes.  He has a 2.5 pack-year smoking history. He quit smokeless tobacco use about 14 months ago. He reports that he does not drink alcohol or use illicit drugs.  Allergies  Allergen Reactions  . Hydrocodone Hives  . Tramadol Anaphylaxis and Other (See Comments)    Acid Reflux  . Ibuprofen Other (See Comments)    Stomach ulcers  . Naproxen Other (See Comments)    Stomach ulcers  . Sulfa Antibiotics Other (See Comments)    Stomach ulcers  . Morphine And Related Rash    Family History  Problem Relation Age of Onset  . Cancer Father   . Alcohol abuse Father   . Pseudochol deficiency Neg Hx   . Malignant hyperthermia Neg Hx   . Hypotension Neg Hx   . Anesthesia problems Neg Hx   . Colon cancer Neg Hx      Prior to Admission medications   Medication Sig Start Date End Date Taking? Authorizing Provider  fluconazole (DIFLUCAN) 50 MG tablet Take 5 tablets (250 mg total) by mouth daily. 04/10/14  Yes Orvil Feil, NP  gabapentin (NEURONTIN) 800 MG tablet Take 800 mg by mouth 3 (three) times daily.   Yes Historical Provider, MD  insulin NPH-regular Human (NOVOLIN 70/30) (70-30) 100 UNIT/ML injection Inject 20 Units into the skin 2 (two) times daily with a meal. 15 units in the morning and 25 at night. Patient taking differently: Inject 15-25 Units into the skin 2 (two) times daily with a meal. 15 units in the morning and 25 at night. 11/17/13  Yes Kathie Dike, MD  insulin regular (NOVOLIN R,HUMULIN R) 100 units/mL injection Inject 2-10 Units into the skin 3 (three) times daily before meals. Sliding scale.   Yes Historical Provider, MD  lisinopril (PRINIVIL,ZESTRIL) 20 MG tablet Take 20 mg by mouth daily.   Yes Historical Provider, MD  metoCLOPramide (REGLAN) 10 MG tablet Take 1 tablet (10 mg total) by mouth 4 (four) times daily -  before meals and at bedtime. 03/10/14  Yes Kathie Dike, MD  ondansetron (ZOFRAN) 4 MG tablet Take 1 tablet (4 mg total)  by mouth 3 (three) times daily with meals. Patient taking differently: Take 4 mg by mouth every 8 (eight) hours as needed for nausea or vomiting.  12/09/13  Yes Orvil Feil, NP  OxyCODONE HCl 7.5 MG TABA Take 1 tablet by mouth every 8 (eight) hours. Patient taking differently: Take 1 tablet by mouth every 6 (six) hours.  04/10/14  Yes Orvil Feil, NP  pantoprazole (PROTONIX) 40 MG tablet Take 1 tablet (40 mg total) by mouth 2 (two) times daily before a meal. 04/10/14  Yes Orvil Feil, NP  polyethylene glycol [facility]) packet Take 17 g by mouth daily. 03/19/14  Yes [name], MD  pravastatin (PRAVACHOL) 20 MG tablet Take 20 mg by mouth daily.  09/10/13 09/10/14 Yes Historical Provider, MD  promethazine (PHENERGAN) 25 MG tablet Take 1 tablet (25 mg total) by mouth every 6 (six) hours as needed for nausea or vomiting. 04/10/14  Yes Orvil Feil, NP  tiZANidine (ZANAFLEX) 4 MG tablet Take 4 mg by mouth every 8 (eight) hours as needed for muscle spasms.  04/07/14  Yes Historical Provider, MD   Physical Exam: Filed Vitals:   04/24/14 1204 04/24/14 1536 04/24/14 1600 04/24/14 1700  BP:  145/101 146/101 163/102  Pulse:  90 108 85  Temp:  98.1 F (36.7 C)  98.2 F (36.8 C)  TempSrc:  Oral  Oral  Resp:  18 23   Height: 6\' 2"  (1.88 m)   6\' 2"  (1.88 m)  Weight: 81.647 kg (180 lb)   84.3 kg (185 lb 13.6 oz)  SpO2:  100% 98%    General:  Appears calm and comfortable; after examiner enters the room he starts complaining of pain Eyes: PERRL, normal lids, irises  ENT: grossly normal hearing, lips & tongue Neck: no LAD, masses or thyromegaly Cardiovascular: RRR, no m/r/g. No LE edema. Respiratory: CTA bilaterally, no w/r/r. Normal respiratory effort. Abdomen: soft, ntnd, no pain with stethoscope pressure. Mild right upper quadrant pain to palpation. No rebound. No guarding. Skin: no rash or induration noted Musculoskeletal: grossly normal tone BUE/BLE Psychiatric: grossly normal mood and affect, speech fluent  and appropriate Neurologic: grossly non-focal.  Wt Readings from Last 3 Encounters:  04/24/14 84.3 kg (185 lb 13.6 oz)  04/10/14 80.559 kg (177 lb 9.6 oz)  04/03/14 86.183 kg (190 lb)    Labs on Admission:  Basic Metabolic Panel:  Recent Labs Lab 04/24/14 1243 04/24/14 1433  NA 131*  --   K 5.7* 5.9*  CL 95*  --   CO2 27  --   GLUCOSE 516*  --   BUN 11  --   CREATININE 0.85  --   CALCIUM 8.8  --     Liver Function Tests:  Recent Labs Lab 04/24/14 1243  AST 18  ALT 17  ALKPHOS 99  BILITOT 0.9  PROT 6.6  ALBUMIN 3.4*    Recent Labs Lab 04/24/14 1243  LIPASE 12    CBC:  Recent Labs Lab 04/24/14 1243  WBC 10.8*  NEUTROABS 7.8*  HGB 12.9*  HCT 39.8  MCV 88.2  PLT 357    CBG:  Recent Labs Lab 04/24/14 1351 04/24/14 1514 04/24/14 1623 04/24/14 1722 04/24/14 1816  GLUCAP 404* 262* 212* 132* 124*     Radiological Exams on Admission: Dg Abd 1 View  04/24/2014   CLINICAL DATA:  Right upper quadrant pain, nausea/vomiting  EXAM: ABDOMEN - 1 VIEW  COMPARISON:  CT abdomen pelvis dated 03/30/2014  FINDINGS: Nonobstructive bowel gas pattern.  Cholecystectomy clips.  Visualized osseous structures are within normal limits.  IMPRESSION: Unremarkable abdominal radiograph.   Electronically Signed   By: Julian Hy M.D.   On: 04/24/2014 13:54    EKG: Independently reviewed. Sinus rhythm, no acute changes   Principal Problem:   Hyperglycemia Active Problems:   DM (diabetes mellitus)   Intractable nausea and vomiting   Chronic generalized abdominal pain   Diabetic gastroparesis associated with type 1 diabetes mellitus   Diabetic neuropathy   Assessment/Plan 1. Hyperglycemia without DKA or coma. Inciting event unclear, but suspect gastroparesis flare +/- mild narcotic withdrawal. EKG nonacute. No signs of acute intra-abdominal process, no evidence of infection. No focal neurologic symptoms. 2. Hyperkalemia, unclear etiology, possibly related to  ACE-I, asymptomatic, EKG non-acute. Not yet improved but has excellent UOP. 3. DM type 1 diagnosed age 27 with associated gastroparesis, neuropathy. 4. Acute on chronic diabetic gastroparesis. Patient understands narcotics will not help the situation. Possible element of mild narcotic withdrawal but no sig autonomic symptoms. Given clinical situation start with Tylenol. 5. Chronic RUQ  abdominal pain. Unrelieved with cholecystectomy last year and has had extensive workup, has been referred to [facility] for further evaluation. Exam is benign. No further evaluation suggested at this point. 6. Cigarette smoker. recommend cessation.  7. History of leaving AMA when does not receive narcotics.   Plan admission for IV fluids, insulin infusion until blood sugars under control, then transition to subcutaneous insulin  Kayexalate, recheck BMP--call to night team  Discontinue lisinopril for now  Avoid IV medications as does not appear to have any acute pain. Reschedule follow-up with pain clinic in the near future. Avoid oral narcotics if possible but would treat if develops autonomic symptoms. Doubt dependency at this point.  Schedule Reglan IV, antiemetics PRN  Screen for HIV  Code Status: full code  DVT prophylaxis:SCDs Family Communication: none  Disposition Plan/Anticipated LOS: admit 1-2 days  Time spent: 50 minutes  Murray Hodgkins, MD  [facility] Pager [phone] 04/24/2014, 6:32 PM

## 2014-04-24 NOTE — ED Notes (Signed)
Pt complain of vomiting and severe abdominal pain

## 2014-04-25 LAB — GLUCOSE, CAPILLARY: Glucose-Capillary: 110 mg/dL — ABNORMAL HIGH (ref 70–99)

## 2014-04-28 DIAGNOSIS — R739 Hyperglycemia, unspecified: Secondary | ICD-10-CM

## 2014-04-28 NOTE — Discharge Summary (Signed)
  Physician Discharge Summary  Brent Daniels [id number] DOB: [birth year] DOA: 04/24/2014  PCP: [name], MD  Admit date: 04/24/2014 Discharge date: 04/28/2014  Patient left AMA  Discharge Diagnoses:  1. Hyperglycemia with DKA or coma 2. Hyperkalemia 3. DM type 1 with associated gastroparesis and neuropathy 4. Acute on chronic diabetic gastroparesis 5. Chronic RUQ abdominal pain 6. Cigarette smoker  Discharge Condition: walked out per RN not Disposition: left AMA  History of present illness:  29 year old man with chronic abdominal pain, status post cholecystectomy last year without improvement, status post extensive workup, follows with gastroenterology as an outpatient who presented with acute onset abdominal pain today associated nausea or vomiting. Reports compliance with insulin. He does report having run out of oral narcotics 3 days prior to presentation. He reports having had a pain management appointment 3/18 however he missed this because he became ill. He reports with compliance with all his medications.  Hospital Course:  Mr. Samek was admitted and treated for hyperglycemia and hyperkalemia as well as flare of gastroparesis. Patient requested further pain medication and decided to leave AMA prior to MD reevaluation.  Discharge Instructions    The results of significant diagnostics from this hospitalization (including imaging, microbiology, ancillary and laboratory) are listed below for reference.    Significant Diagnostic Studies: Dg Abd 1 View  04/24/2014   CLINICAL DATA:  Right upper quadrant pain, nausea/vomiting  EXAM: ABDOMEN - 1 VIEW  COMPARISON:  CT abdomen pelvis dated 03/30/2014  FINDINGS: Nonobstructive bowel gas pattern.  Cholecystectomy clips.  Visualized osseous structures are within normal limits.  IMPRESSION: Unremarkable abdominal radiograph.   Electronically Signed   By: Julian Hy M.D.   On: 04/24/2014 13:54    Labs: Basic Metabolic  Panel:  Recent Labs Lab 04/24/14 1243 04/24/14 1433 04/24/14 1932  NA 131*  --  139  K 5.7* 5.9* 3.2*  CL 95*  --  102  CO2 27  --  29  GLUCOSE 516*  --  131*  BUN 11  --  10  CREATININE 0.85  --  0.72  CALCIUM 8.8  --  8.7   Liver Function Tests:  Recent Labs Lab 04/24/14 1243  AST 18  ALT 17  ALKPHOS 99  BILITOT 0.9  PROT 6.6  ALBUMIN 3.4*    Recent Labs Lab 04/24/14 1243  LIPASE 12   CBC:  Recent Labs Lab 04/24/14 1243  WBC 10.8*  NEUTROABS 7.8*  HGB 12.9*  HCT 39.8  MCV 88.2  PLT 357   CBG:  Recent Labs Lab 04/24/14 1514 04/24/14 1623 04/24/14 1722 04/24/14 1816 04/24/14 1956  GLUCAP 262* 212* 132* 124* 110*    Principal Problem:   Hyperglycemia Active Problems:   DM (diabetes mellitus)   Intractable nausea and vomiting   Chronic generalized abdominal pain   Diabetic gastroparesis associated with type 1 diabetes mellitus   Diabetic neuropathy   Hyperglycemia due to type 1 diabetes mellitus   Signed:  Murray Hodgkins, MD [facility] 04/28/2014, 2:01 PM

## 2014-04-29 NOTE — Care Management Utilization Note (Signed)
UR completed 

## 2014-06-09 ENCOUNTER — Other Ambulatory Visit (HOSPITAL_COMMUNITY): Payer: Self-pay | Admitting: Pain Medicine

## 2014-06-09 DIAGNOSIS — M545 Low back pain: Secondary | ICD-10-CM

## 2014-06-17 ENCOUNTER — Ambulatory Visit (HOSPITAL_COMMUNITY)
Admission: RE | Admit: 2014-06-17 | Discharge: 2014-06-17 | Disposition: A | Discharge status: Home / Self Care | Payer: Managed Care, Other (non HMO) | Source: Ambulatory Visit | Attending: Pain Medicine | Admitting: Pain Medicine

## 2014-06-17 DIAGNOSIS — M5416 Radiculopathy, lumbar region: Secondary | ICD-10-CM | POA: Diagnosis not present

## 2014-06-17 DIAGNOSIS — M545 Low back pain: Secondary | ICD-10-CM

## 2014-11-13 ENCOUNTER — Emergency Department (HOSPITAL_COMMUNITY): Payer: Managed Care, Other (non HMO)

## 2014-11-13 ENCOUNTER — Encounter (HOSPITAL_COMMUNITY): Payer: Self-pay | Admitting: *Deleted

## 2014-11-13 ENCOUNTER — Inpatient Hospital Stay (HOSPITAL_COMMUNITY)
Admission: EM | Admit: 2014-11-13 | Discharge: 2014-11-13 | DRG: 683 | Discharge status: Against Medical Advice | Payer: Managed Care, Other (non HMO) | Attending: Internal Medicine | Admitting: Internal Medicine

## 2014-11-13 DIAGNOSIS — K219 Gastro-esophageal reflux disease without esophagitis: Secondary | ICD-10-CM | POA: Diagnosis present

## 2014-11-13 DIAGNOSIS — E871 Hypo-osmolality and hyponatremia: Secondary | ICD-10-CM | POA: Diagnosis present

## 2014-11-13 DIAGNOSIS — E1069 Type 1 diabetes mellitus with other specified complication: Secondary | ICD-10-CM | POA: Diagnosis present

## 2014-11-13 DIAGNOSIS — R112 Nausea with vomiting, unspecified: Secondary | ICD-10-CM | POA: Diagnosis present

## 2014-11-13 DIAGNOSIS — D72829 Elevated white blood cell count, unspecified: Secondary | ICD-10-CM | POA: Diagnosis present

## 2014-11-13 DIAGNOSIS — Z8639 Personal history of other endocrine, nutritional and metabolic disease: Secondary | ICD-10-CM

## 2014-11-13 DIAGNOSIS — E1049 Type 1 diabetes mellitus with other diabetic neurological complication: Secondary | ICD-10-CM | POA: Diagnosis not present

## 2014-11-13 DIAGNOSIS — Z794 Long term (current) use of insulin: Secondary | ICD-10-CM | POA: Diagnosis not present

## 2014-11-13 DIAGNOSIS — K3184 Gastroparesis: Secondary | ICD-10-CM

## 2014-11-13 DIAGNOSIS — Z87898 Personal history of other specified conditions: Secondary | ICD-10-CM

## 2014-11-13 DIAGNOSIS — E1043 Type 1 diabetes mellitus with diabetic autonomic (poly)neuropathy: Secondary | ICD-10-CM | POA: Diagnosis present

## 2014-11-13 DIAGNOSIS — I10 Essential (primary) hypertension: Secondary | ICD-10-CM | POA: Diagnosis present

## 2014-11-13 DIAGNOSIS — E860 Dehydration: Secondary | ICD-10-CM

## 2014-11-13 DIAGNOSIS — G43A1 Cyclical vomiting, intractable: Secondary | ICD-10-CM

## 2014-11-13 DIAGNOSIS — N179 Acute kidney failure, unspecified: Principal | ICD-10-CM | POA: Diagnosis present

## 2014-11-13 DIAGNOSIS — E7800 Pure hypercholesterolemia, unspecified: Secondary | ICD-10-CM | POA: Diagnosis present

## 2014-11-13 DIAGNOSIS — E1042 Type 1 diabetes mellitus with diabetic polyneuropathy: Secondary | ICD-10-CM | POA: Diagnosis present

## 2014-11-13 DIAGNOSIS — F17210 Nicotine dependence, cigarettes, uncomplicated: Secondary | ICD-10-CM | POA: Diagnosis present

## 2014-11-13 DIAGNOSIS — M419 Scoliosis, unspecified: Secondary | ICD-10-CM | POA: Diagnosis present

## 2014-11-13 DIAGNOSIS — E1065 Type 1 diabetes mellitus with hyperglycemia: Secondary | ICD-10-CM | POA: Diagnosis present

## 2014-11-13 DIAGNOSIS — R1110 Vomiting, unspecified: Secondary | ICD-10-CM

## 2014-11-13 DIAGNOSIS — N185 Chronic kidney disease, stage 5: Secondary | ICD-10-CM

## 2014-11-13 DIAGNOSIS — M1990 Unspecified osteoarthritis, unspecified site: Secondary | ICD-10-CM | POA: Diagnosis present

## 2014-11-13 DIAGNOSIS — E1122 Type 2 diabetes mellitus with diabetic chronic kidney disease: Secondary | ICD-10-CM

## 2014-11-13 LAB — CBC WITH DIFFERENTIAL/PLATELET
Basophils Absolute: 0.1 10*3/uL (ref 0.0–0.1)
Basophils Relative: 0 %
EOS ABS: 0.1 10*3/uL (ref 0.0–0.7)
EOS PCT: 0 %
HCT: 42 % (ref 39.0–52.0)
Hemoglobin: 14.4 g/dL (ref 13.0–17.0)
Lymphocytes Relative: 11 %
Lymphs Abs: 2.2 10*3/uL (ref 0.7–4.0)
MCH: 30.5 pg (ref 26.0–34.0)
MCHC: 34.3 g/dL (ref 30.0–36.0)
MCV: 89 fL (ref 78.0–100.0)
Monocytes Absolute: 1.3 10*3/uL — ABNORMAL HIGH (ref 0.1–1.0)
Monocytes Relative: 6 %
Neutro Abs: 16.9 10*3/uL — ABNORMAL HIGH (ref 1.7–7.7)
Neutrophils Relative %: 83 %
PLATELETS: 407 10*3/uL — AB (ref 150–400)
RBC: 4.72 MIL/uL (ref 4.22–5.81)
RDW: 13.9 % (ref 11.5–15.5)
WBC: 20.5 10*3/uL — ABNORMAL HIGH (ref 4.0–10.5)

## 2014-11-13 LAB — URINALYSIS, ROUTINE W REFLEX MICROSCOPIC
Glucose, UA: >1000 mg/dL — AB
KETONES UR: 15 mg/dL — AB
LEUKOCYTES UA: NEGATIVE
NITRITE: NEGATIVE
Protein, ur: 30 mg/dL — AB
SPECIFIC GRAVITY, URINE: 1.025 (ref 1.005–1.030)
UROBILINOGEN UA: 1 mg/dL (ref 0.0–1.0)
pH: 6 (ref 5.0–8.0)

## 2014-11-13 LAB — LACTIC ACID, PLASMA: Lactic Acid, Venous: 1 mmol/L (ref 0.5–2.0)

## 2014-11-13 LAB — MRSA PCR SCREENING: MRSA by PCR: NEGATIVE

## 2014-11-13 LAB — COMPREHENSIVE METABOLIC PANEL
ALT: 19 U/L (ref 17–63)
AST: 20 U/L (ref 15–41)
Albumin: 5 g/dL (ref 3.5–5.0)
Alkaline Phosphatase: 109 U/L (ref 38–126)
Anion gap: 12 (ref 5–15)
BUN: 38 mg/dL — ABNORMAL HIGH (ref 6–20)
CO2: 37 mmol/L — AB (ref 22–32)
CREATININE: 1.39 mg/dL — AB (ref 0.61–1.24)
Calcium: 10 mg/dL (ref 8.9–10.3)
Chloride: 85 mmol/L — ABNORMAL LOW (ref 101–111)
GFR calc Af Amer: >60 mL/min (ref >60)
GFR calc non Af Amer: >60 mL/min (ref >60)
Glucose, Bld: 285 mg/dL — ABNORMAL HIGH (ref 65–99)
Potassium: 3.8 mmol/L (ref 3.5–5.1)
Sodium: 134 mmol/L — ABNORMAL LOW (ref 135–145)
Total Bilirubin: 2.6 mg/dL — ABNORMAL HIGH (ref 0.3–1.2)
Total Protein: 8.5 g/dL — ABNORMAL HIGH (ref 6.5–8.1)

## 2014-11-13 LAB — URINE MICROSCOPIC-ADD ON

## 2014-11-13 LAB — GLUCOSE, CAPILLARY: GLUCOSE-CAPILLARY: 360 mg/dL — AB (ref 65–99)

## 2014-11-13 LAB — LIPASE, BLOOD: Lipase: 11 U/L — ABNORMAL LOW (ref 22–51)

## 2014-11-13 LAB — BILIRUBIN, DIRECT: BILIRUBIN DIRECT: 0.3 mg/dL (ref 0.1–0.5)

## 2014-11-13 LAB — CBG MONITORING, ED: GLUCOSE-CAPILLARY: 259 mg/dL — AB (ref 65–99)

## 2014-11-13 MED ORDER — ONDANSETRON HCL 4 MG/2ML IJ SOLN
4.0000 mg | Freq: Four times a day (QID) | INTRAMUSCULAR | Status: DC | PRN
  Administered 2014-11-13: 4 mg via INTRAVENOUS
  Filled 2014-11-13: qty 2

## 2014-11-13 MED ORDER — PANTOPRAZOLE SODIUM 40 MG PO TBEC
40.0000 mg | DELAYED_RELEASE_TABLET | Freq: Two times a day (BID) | ORAL | Status: DC
  Administered 2014-11-13: 40 mg via ORAL
  Filled 2014-11-13: qty 1

## 2014-11-13 MED ORDER — SODIUM CHLORIDE 0.9 % IV BOLUS (SEPSIS)
1000.0000 mL | Freq: Once | INTRAVENOUS | Status: AC
  Administered 2014-11-13: 1000 mL via INTRAVENOUS

## 2014-11-13 MED ORDER — INSULIN GLARGINE 100 UNIT/ML ~~LOC~~ SOLN
20.0000 [IU] | Freq: Two times a day (BID) | SUBCUTANEOUS | Status: DC
  Filled 2014-11-13 (×6): qty 0.2

## 2014-11-13 MED ORDER — METOCLOPRAMIDE HCL 5 MG/ML IJ SOLN
10.0000 mg | Freq: Four times a day (QID) | INTRAMUSCULAR | Status: DC
  Administered 2014-11-13: 10 mg via INTRAVENOUS
  Filled 2014-11-13: qty 2

## 2014-11-13 MED ORDER — KETOROLAC TROMETHAMINE 15 MG/ML IJ SOLN
15.0000 mg | Freq: Four times a day (QID) | INTRAMUSCULAR | Status: DC | PRN
  Administered 2014-11-13: 15 mg via INTRAVENOUS
  Filled 2014-11-13: qty 1

## 2014-11-13 MED ORDER — ACETAMINOPHEN 325 MG PO TABS: 650.0000 mg | ORAL_TABLET | Freq: Four times a day (QID) | ORAL | Status: DC | PRN

## 2014-11-13 MED ORDER — METOCLOPRAMIDE HCL 5 MG/ML IJ SOLN
5.0000 mg | Freq: Once | INTRAMUSCULAR | Status: AC
  Administered 2014-11-13: 5 mg via INTRAVENOUS
  Filled 2014-11-13: qty 2

## 2014-11-13 MED ORDER — LORAZEPAM 2 MG/ML IJ SOLN
1.0000 mg | Freq: Once | INTRAMUSCULAR | Status: AC
  Administered 2014-11-13: 1 mg via INTRAVENOUS
  Filled 2014-11-13: qty 1

## 2014-11-13 MED ORDER — OXYCODONE-ACETAMINOPHEN 5-325 MG PO TABS
1.0000 | ORAL_TABLET | Freq: Once | ORAL | Status: AC
  Administered 2014-11-13: 1 via ORAL
  Filled 2014-11-13: qty 1

## 2014-11-13 MED ORDER — ONDANSETRON HCL 4 MG PO TABS: 4.0000 mg | ORAL_TABLET | Freq: Four times a day (QID) | ORAL | Status: DC | PRN

## 2014-11-13 MED ORDER — SODIUM CHLORIDE 0.9 % IV SOLN
INTRAVENOUS | Status: DC
  Administered 2014-11-13: 1000 mL via INTRAVENOUS
  Administered 2014-11-13: 04:00:00 via INTRAVENOUS

## 2014-11-13 MED ORDER — PANTOPRAZOLE SODIUM 40 MG IV SOLR
40.0000 mg | Freq: Once | INTRAVENOUS | Status: AC
  Administered 2014-11-13: 40 mg via INTRAVENOUS
  Filled 2014-11-13: qty 40

## 2014-11-13 MED ORDER — ENOXAPARIN SODIUM 40 MG/0.4ML ~~LOC~~ SOLN
40.0000 mg | SUBCUTANEOUS | Status: DC
  Administered 2014-11-13: 40 mg via SUBCUTANEOUS
  Filled 2014-11-13: qty 0.4

## 2014-11-13 MED ORDER — INSULIN GLARGINE 100 UNIT/ML ~~LOC~~ SOLN
15.0000 [IU] | Freq: Two times a day (BID) | SUBCUTANEOUS | Status: DC
  Administered 2014-11-13: 15 [IU] via SUBCUTANEOUS
  Filled 2014-11-13 (×7): qty 0.15

## 2014-11-13 MED ORDER — INSULIN ASPART 100 UNIT/ML ~~LOC~~ SOLN
0.0000 [IU] | Freq: Three times a day (TID) | SUBCUTANEOUS | Status: DC
  Administered 2014-11-13: 3 [IU] via SUBCUTANEOUS
  Administered 2014-11-13: 15 [IU] via SUBCUTANEOUS

## 2014-11-13 MED ORDER — INSULIN ASPART 100 UNIT/ML ~~LOC~~ SOLN: 0.0000 [IU] | Freq: Every day | SUBCUTANEOUS | Status: DC

## 2014-11-13 MED ORDER — DICYCLOMINE HCL 10 MG PO CAPS
10.0000 mg | ORAL_CAPSULE | Freq: Once | ORAL | Status: AC
  Administered 2014-11-13: 10 mg via ORAL
  Filled 2014-11-13: qty 1

## 2014-11-13 MED ORDER — DIPHENHYDRAMINE HCL 50 MG/ML IJ SOLN
25.0000 mg | Freq: Once | INTRAMUSCULAR | Status: AC
  Administered 2014-11-13: 25 mg via INTRAVENOUS
  Filled 2014-11-13: qty 1

## 2014-11-13 MED ORDER — HALOPERIDOL LACTATE 5 MG/ML IJ SOLN
2.0000 mg | Freq: Once | INTRAMUSCULAR | Status: AC
  Administered 2014-11-13: 2 mg via INTRAVENOUS
  Filled 2014-11-13: qty 1

## 2014-11-13 MED ORDER — GABAPENTIN 400 MG PO CAPS
800.0000 mg | ORAL_CAPSULE | Freq: Three times a day (TID) | ORAL | Status: DC
  Administered 2014-11-13: 800 mg via ORAL
  Filled 2014-11-13: qty 2

## 2014-11-13 MED ORDER — PROCHLORPERAZINE EDISYLATE 5 MG/ML IJ SOLN
10.0000 mg | Freq: Four times a day (QID) | INTRAMUSCULAR | Status: DC | PRN
  Administered 2014-11-13 (×2): 10 mg via INTRAVENOUS
  Filled 2014-11-13 (×3): qty 2

## 2014-11-13 NOTE — ED Notes (Signed)
Report given to Robbie RN.

## 2014-11-13 NOTE — ED Notes (Signed)
Dr Dina Rich notified,

## 2014-11-13 NOTE — H&P (Signed)
History and Physical  Brent Daniels  [id number]  DOB: Sep 23, 1985  DOA: 11/13/2014  Referring physician: Thayer Jew, MD PCP: [name], MD   Chief Complaint: Abdominal pain, intractable N/V  HPI: Brent Daniels is a 29 y.o. male with a past medical history significant for T1 DM complicated by gastroparesis, chronic abdominal pain followed by Dr. [name], and recurrent nausea and vomiting who presents with abdominal pain and nausea and vomiting for 3 days.  The patient was in his usual state of health until about 3 days ago when he presented to [facility] with severe epigastric pain and nausea and vomiting. He was treated there symptomatically and released. Since then he hasn't been able to eat anything. His home pantoprazole and metoclopramide do nothing. His pain is epigastric, severe, constant.  In the ED, the patient was slightly tachycardic, hypertensive, and had evidence of metabolic alkalosis, leukocytosis, and AKI. He was hyperglycemic but nonketotic. His lactic acid was normal. His bilirubin was slightly elevated but transaminases were normal. He was admitted to [facility] for AKI and intractable nausea and vomiting.   Review of Systems:  Patient seen 3:53 AM on 11/13/2014. Pt complains of severe abdominal pain, nausea, vomiting. Pt denies any fever, chills, cough, chest pain, diarrhea, hematemesis, hematochezia.  All other systems negative except as just noted or noted in the history of present illness.  Past Medical History  Diagnosis Date  . Diabetes mellitus     type 1  . Arthritis   . GERD (gastroesophageal reflux disease)   . PUD (peptic ulcer disease)   . Hypertension   . Heart murmur     valvular stenosis  . Neuropathy ([place])   . Diabetic gastroparesis associated with type 1 diabetes mellitus ([place])   . S/P arthroscopic knee surgery 07/04/2011  . Scoliosis 07/11/2012  . Chronic back pain   . Chronic abdominal pain   . Nausea and vomiting     chronic,  recurrent  . Acute esophagitis   . Gastroparesis   . Erosive esophagitis 12/23/2013    "severe" per EGD   . Insomnia   . Anxiety   . Hypercholesteremia   The above past medical history was reviewed.  Past Surgical History  Procedure Laterality Date  . Tympanostomy tube placement    . Knee arthroscopy  06/30/2011    Procedure: ARTHROSCOPY KNEE;  Surgeon: Carole Civil, MD;  Location: [facility];  Service: Orthopedics;  Laterality: Right;  diagnostic arthroscopy  . Esophagogastroduodenoscopy (egd) with propofol  06/17/2013    Dr. Oneida Alar: two gastric ulcers in fundus, mild antral gastritis, candida esophagitis  . Esophageal biopsy  06/17/2013    Procedure: GASTRIC ULCER AND ANTRAL BIOPSIES; ESOPHAGEAL BRUSHING;  Surgeon: Danie Binder, MD;  Location: [facility];  Service: Endoscopy;;  . Esophagogastroduodenoscopy (egd) with propofol N/A 09/11/2013    Dr. [name]: abnormal hypopharynx, question massively enlarged tonsils, stomach full of food precluded exam, needs EGD for verification of ulcer healing at a later date  . Esophagogastroduodenoscopy (egd) with propofol N/A 12/23/2013    RMR: Severe exudative esophagitis likely predominatly reflux related. Superimposed Candida infection not excluded status post KOH brushing and biopsy. Localized excoriating gastric mucosa most consistant with trauma(vomiting). No evidence of peptic ulcer disease or other gastric/duodenal pathology. I suspect severe inflammation involving the distal esophagus may account  for at least some of patii  . Esophageal biopsy N/A 12/23/2013    Procedure: ESOPHAGEAL BIOPSY;  Surgeon: Daneil Dolin, MD;  Location: [facility];  Service: Endoscopy;  Laterality: N/A;  . Cholecystectomy N/A 01/28/2014    Procedure: LAPAROSCOPIC CHOLECYSTECTOMY;  Surgeon: Jamesetta So, MD;  Location: [facility];  Service: General;  Laterality: N/A;  The above surgical history was reviewed.  Social History: The patient is an active smoker. He denies illicit  drug use.     Allergies  Allergen Reactions  . Hydrocodone Hives  . Tramadol Anaphylaxis and Other (See Comments)    Acid Reflux  . Ibuprofen Other (See Comments)    Stomach ulcers  . Naproxen Other (See Comments)    Stomach ulcers  . Sulfa Antibiotics Other (See Comments)    Stomach ulcers  . Morphine And Related Rash    Family History  Problem Relation Age of Onset  . Cancer Father   . Alcohol abuse Father   . Pseudochol deficiency Neg Hx   . Malignant hyperthermia Neg Hx   . Hypotension Neg Hx   . Anesthesia problems Neg Hx   . Colon cancer Neg Hx     Prior to Admission medications   Medication Sig Start Date End Date Taking? Authorizing Provider  gabapentin (NEURONTIN) 800 MG tablet Take 800 mg by mouth 3 (three) times daily.   Yes Historical Provider, MD  insulin NPH-regular Human (NOVOLIN 70/30) (70-30) 100 UNIT/ML injection Inject 20 Units into the skin 2 (two) times daily with a meal. 15 units in the morning and 25 at night. Patient taking differently: Inject 15-25 Units into the skin 2 (two) times daily with a meal. 15 units in the morning and 25 at night. 11/17/13  Yes Kathie Dike, MD  insulin regular (NOVOLIN R,HUMULIN R) 100 units/mL injection Inject 2-10 Units into the skin 3 (three) times daily before meals. Sliding scale.   Yes Historical Provider, MD  lisinopril (PRINIVIL,ZESTRIL) 20 MG tablet Take 20 mg by mouth daily.   Yes Historical Provider, MD  pantoprazole (PROTONIX) 40 MG tablet Take 1 tablet (40 mg total) by mouth 2 (two) times daily before a meal. 04/10/14  Yes Orvil Feil, NP  pravastatin (PRAVACHOL) 20 MG tablet Take 20 mg by mouth daily.  09/10/13 09/10/14  Historical Provider, MD    Physical Exam: BP 149/85 mmHg  Pulse 93  Temp(Src) 98.4 F (36.9 C) (Oral)  Resp 20  Ht 6\' 1"  (1.854 m)  Wt 81.647 kg (180 lb)  BMI 23.75 kg/m2  SpO2 98% General appearance: Well-developed, adult male, alert and in obvious distress from pain and nausea.  Dry  heaves occasionally.  Responds appropriately to questions.   Eyes: Sclerae normal without icterus, conjunctiva pink, lids and lashes normal.  PERRL and EOMI.   Nose: No deformity, discharge, or epistaxis.   Mouth: OP tacky without erythema, exudates, cobblestoning, or ulcers.  No airway deformities.   Skin: Warm and dry.  No jaundice.  No suspicious rashes or lesions. Cardiac: Tachycardic, nl S1-S2, no murmurs appreciated on my exam.  Capillary refill is less than 2 seconds.  No LE edema.  Radial pulses 2+ and symmetric. Respiratory: Normal respiratory rate and rhythm.  CTAB without rales or wheezes. Abdomen: BS present.  Abdomen soft without rigidity.  Moderate diffuse TTP no rebound. No ascites, distension.  Old well-healed cholecystectomy scars. Neuro: Sensorium intact.  Speech is fluent.  Attention and concentration are normal.  Memory seems intact.  Moves all extremities equally and with normal coordination.    Psych: Appropriate affect.  Speech normal. Thought content/process linear/appropriate.  No evidence of aural or visual hallucinations or delusions.  Labs on Admission:  The metabolic panel is notable for metabolic alkalosis, AKI. The patient is hyperglycemic. Anion gap is normal. The potassium is normal. Lactic acid is normal. Transaminases are normal. The total bilirubin level is 2.6 g/dL. The complete blood count is notable for leukocytosis with normal hemoglobin and platelet count.   Radiological Exams on Admission: Personally reviewed: Dg Abd Acute W/chest 11/13/2014    Clear chest.  Non-specific bowel gas pattern without evidence of SBO or free air.   EKG: Independently reviewed. Sinus tachycardia.    Assessment/Plan Principal Problem:   AKI (acute kidney injury) ([place]) Active Problems:   Essential hypertension   DM (diabetes mellitus) ([facility])   Leukocytosis   Gastroparesis   Nausea & vomiting  1. AKI:  This is new.  Urinalysis shows white cell casts which  suggests AIN. However he denies new medicines or over-the-counter medicines. -Urine electrolytes -Patient received 2 L normal saline in the ED -Continue MIVF  2. Gastroparesis, nausea vomiting, abdominal pain, leukocytosis:  This is recurrent.  We'll attempt combination of lorazepam and Phenergan now. -Avoid motility slowing agents -Zofran when necessary -Repeat CBC to monitor WBC  3. Elevated bilirubin: Suspect this is related to nausea/vomiting.   -Fractionated bilirubin is ordered  4. Type 1 diabetes, poorly controlled, with neuropathic complications:  -Hemoglobin A1c -Continue home Lantus, at reduced dose of 15 units twice daily -Sliding scale with meals  5. HTN:  Hypertensive at admission. -Hold lisinopril until AKI resolves.     DVT PPx: Lovenox Diet: Clears Code Status: Full   Disposition Plan:  At the time of admission, it appears that the appropriate admission status for this patient is INPATIENT. This is judged to be reasonable and necessary in order to provide the required intensity of service to ensure the patient's safety given the presenting symptoms, physical exam findings, and initial radiographic and laboratory data in the context of their chronic comorbidities.  Together, these circumstances are felt to place her/him at high risk for further clinical deterioration. The following factors support the admission status of inpatient:   A. The patient's presenting symptoms include intractable nauesea and vomiting. B. The worrisome physical exam findings include tachycardia. C. The initial radiographic and laboratory data are worrisome because of elevated creatinine. D. The chronic co-morbidities include type 1 diabetes with gastroparesis. E. Patient requires inpatient status due to high intensity of service, high risk for further deterioration and high frequency of surveillance required. F. I certify that at the point of admission it is my clinical judgment that the  patient will require inpatient hospital care spanning beyond 2 midnights from the point of admission.    Edwin Dada [facility] Pager [phone]

## 2014-11-13 NOTE — ED Provider Notes (Addendum)
CSN: [id number]     Arrival date & time 11/13/14  0023 History   First MD Initiated Contact with Patient 11/13/14 0025     Chief Complaint  Patient presents with  . Abdominal Pain     (Consider location/radiation/quality/duration/timing/severity/associated sxs/prior Treatment) HPI  This is a 29 year old male with a history of diabetes, peptic ulcer disease, gastroparesis, chronic abdominal pain who presents with abdominal pain and nausea and vomiting. Patient reports 2 to three-day history of ongoing diffuse abdominal pain. It is nonradiating. Rates his pain a 10 out of 10. He has not taken anything at home for the pain.  He reports associated vomiting. No diarrhea or constipation.  Reports that his blood sugars at home have been high. He reports they've been high as the 570s. History of DKA.  Of note, patient has a long-standing history of gastroparesis and chronic abdominal pain. He has not been in our ER in approximately 6 months. However, prior to that he had multiple admissions with similar symptoms.  He was admitted for DKA and left multiple times [place] for not receiving narcotic pain medication.  During history taking, patient was noted to have adhesive tape residue on his chest. When asked if he has recently been in the hospital, he states that he was seen at [facility] one week ago for the same.  It appears that he had an ultrasound 2 days ago (presumably at [facility]) that was normal.  Medical records requested from [place] records from [place] indicate patient was seen on 11/11/14. Similar presentation for nausea and vomiting. He reported to them that he was still in pain management which he failed to report to me.   Based on documentation, there was concern for narcotic withdrawal and patient was told he would not receive narcotic pain medication. He was offered admission for hydration and gastroparesis. When told he would not receive narcotic pain  medication, he signed out [place]. Of note, patient tolerated morphine while in the ER at [facility] which he reports an allergy to here.   Past Medical History  Diagnosis Date  . Diabetes mellitus     type 1  . Arthritis   . GERD (gastroesophageal reflux disease)   . PUD (peptic ulcer disease)   . Hypertension   . Heart murmur     valvular stenosis  . Neuropathy ([place])   . Diabetic gastroparesis associated with type 1 diabetes mellitus ([place])   . S/P arthroscopic knee surgery 07/04/2011  . Scoliosis 07/11/2012  . Chronic back pain   . Chronic abdominal pain   . Nausea and vomiting     chronic, recurrent  . Acute esophagitis   . Gastroparesis   . Erosive esophagitis 12/23/2013    "severe" per EGD   . Insomnia   . Anxiety   . Hypercholesteremia    Past Surgical History  Procedure Laterality Date  . Tympanostomy tube placement    . Knee arthroscopy  06/30/2011    Procedure: ARTHROSCOPY KNEE;  Surgeon: Carole Civil, MD;  Location: [facility];  Service: Orthopedics;  Laterality: Right;  diagnostic arthroscopy  . Esophagogastroduodenoscopy (egd) with propofol  06/17/2013    Dr. Oneida Alar: two gastric ulcers in fundus, mild antral gastritis, candida esophagitis  . Esophageal biopsy  06/17/2013    Procedure: GASTRIC ULCER AND ANTRAL BIOPSIES; ESOPHAGEAL BRUSHING;  Surgeon: Danie Binder, MD;  Location: [facility];  Service: Endoscopy;;  . Esophagogastroduodenoscopy (egd) with propofol N/A 09/11/2013    Dr.  Rourk: abnormal hypopharynx, question massively enlarged tonsils, stomach full of food precluded exam, needs EGD for verification of ulcer healing at a later date  . Esophagogastroduodenoscopy (egd) with propofol N/A 12/23/2013    RMR: Severe exudative esophagitis likely predominatly reflux related. Superimposed Candida infection not excluded status post KOH brushing and biopsy. Localized excoriating gastric mucosa most consistant with trauma(vomiting). No evidence of peptic ulcer  disease or other gastric/duodenal pathology. I suspect severe inflammation involving the distal esophagus may account  for at least some of patii  . Esophageal biopsy N/A 12/23/2013    Procedure: ESOPHAGEAL BIOPSY;  Surgeon: Daneil Dolin, MD;  Location: [facility];  Service: Endoscopy;  Laterality: N/A;  . Cholecystectomy N/A 01/28/2014    Procedure: LAPAROSCOPIC CHOLECYSTECTOMY;  Surgeon: Jamesetta So, MD;  Location: [facility];  Service: General;  Laterality: N/A;   Family History  Problem Relation Age of Onset  . Cancer Father   . Alcohol abuse Father   . Pseudochol deficiency Neg Hx   . Malignant hyperthermia Neg Hx   . Hypotension Neg Hx   . Anesthesia problems Neg Hx   . Colon cancer Neg Hx    Social History  Substance Use Topics  . Smoking status: Current Some Day Smoker -- 0.25 packs/day for 10 years    Types: Cigarettes  . Smokeless tobacco: Former Systems developer    Quit date: 02/17/2013     Comment: smokes 4 cigarettes a day   . Alcohol Use: No    Review of Systems  Constitutional: Negative.  Negative for fever.  Respiratory: Negative.  Negative for chest tightness and shortness of breath.   Cardiovascular: Negative.  Negative for chest pain.  Gastrointestinal: Positive for nausea, vomiting and abdominal pain. Negative for diarrhea and constipation.  Genitourinary: Negative.  Negative for dysuria.  Neurological: Negative for headaches.  All other systems reviewed and are negative.     Allergies  Hydrocodone; Tramadol; Ibuprofen; Naproxen; Sulfa antibiotics; and Morphine and related  Home Medications   Prior to Admission medications   Medication Sig Start Date End Date Taking? Authorizing Provider  gabapentin (NEURONTIN) 800 MG tablet Take 800 mg by mouth 3 (three) times daily.   Yes Historical Provider, MD  insulin NPH-regular Human (NOVOLIN 70/30) (70-30) 100 UNIT/ML injection Inject 20 Units into the skin 2 (two) times daily with a meal. 15 units in the morning and 25 at  night. Patient taking differently: Inject 15-25 Units into the skin 2 (two) times daily with a meal. 15 units in the morning and 25 at night. 11/17/13  Yes Kathie Dike, MD  insulin regular (NOVOLIN R,HUMULIN R) 100 units/mL injection Inject 2-10 Units into the skin 3 (three) times daily before meals. Sliding scale.   Yes Historical Provider, MD  lisinopril (PRINIVIL,ZESTRIL) 20 MG tablet Take 20 mg by mouth daily.   Yes Historical Provider, MD  pantoprazole (PROTONIX) 40 MG tablet Take 1 tablet (40 mg total) by mouth 2 (two) times daily before a meal. 04/10/14  Yes Orvil Feil, NP  polyethylene glycol [facility]) packet Take 17 g by mouth daily. 03/19/14  Yes [name], MD  tiZANidine (ZANAFLEX) 4 MG tablet Take 4 mg by mouth every 8 (eight) hours as needed for muscle spasms.  04/07/14  Yes Historical Provider, MD  fluconazole (DIFLUCAN) 50 MG tablet Take 5 tablets (250 mg total) by mouth daily. 04/10/14   Orvil Feil, NP  metoCLOPramide (REGLAN) 10 MG tablet Take 1 tablet (10 mg total) by mouth 4 (  four) times daily -  before meals and at bedtime. 03/10/14   Kathie Dike, MD  ondansetron (ZOFRAN) 4 MG tablet Take 1 tablet (4 mg total) by mouth 3 (three) times daily with meals. Patient taking differently: Take 4 mg by mouth every 8 (eight) hours as needed for nausea or vomiting.  12/09/13   Orvil Feil, NP  OxyCODONE HCl 7.5 MG TABA Take 1 tablet by mouth every 8 (eight) hours. Patient taking differently: Take 1 tablet by mouth every 6 (six) hours.  04/10/14   Orvil Feil, NP  pravastatin (PRAVACHOL) 20 MG tablet Take 20 mg by mouth daily.  09/10/13 09/10/14  Historical Provider, MD  promethazine (PHENERGAN) 25 MG tablet Take 1 tablet (25 mg total) by mouth every 6 (six) hours as needed for nausea or vomiting. 04/10/14   Orvil Feil, NP   BP 165/93 mmHg  Pulse 84  Temp(Src) 98.4 F (36.9 C) (Oral)  Resp 16  Ht 6\' 1"  (1.854 m)  Wt 180 lb (81.647 kg)  BMI 23.75 kg/m2  SpO2 98% Physical Exam   Constitutional: He is oriented to person, place, and time. He appears well-developed and well-nourished. No distress.  HENT:  Head: Normocephalic and atraumatic.  Cardiovascular: Normal rate, regular rhythm and normal heart sounds.   No murmur heard. Pulmonary/Chest: Effort normal and breath sounds normal. No respiratory distress. He has no wheezes.  Abdominal: Soft. Bowel sounds are normal. There is tenderness. There is no rebound and no guarding.  Diffuse tenderness to palpation on exam but distractible  Musculoskeletal: He exhibits no edema.  Neurological: He is alert and oriented to person, place, and time.  Skin: Skin is warm and dry.  Psychiatric: He has a normal mood and affect.  Nursing note and vitals reviewed.   ED Course  Procedures (including critical care time) Labs Review Labs Reviewed  CBC WITH DIFFERENTIAL/PLATELET - Abnormal; Notable for the following:    WBC 20.5 (*)    Platelets 407 (*)    Neutro Abs 16.9 (*)    Monocytes Absolute 1.3 (*)    All other components within normal limits  COMPREHENSIVE METABOLIC PANEL - Abnormal; Notable for the following:    Sodium 134 (*)    Chloride 85 (*)    CO2 37 (*)    Glucose, Bld 285 (*)    BUN 38 (*)    Creatinine, Ser 1.39 (*)    Total Protein 8.5 (*)    Total Bilirubin 2.6 (*)    All other components within normal limits  LIPASE, BLOOD - Abnormal; Notable for the following:    Lipase 11 (*)    All other components within normal limits  URINALYSIS, ROUTINE W REFLEX MICROSCOPIC (NOT AT [facility]) - Abnormal; Notable for the following:    Color, Urine AMBER (*)    Glucose, UA >1000 (*)    Hgb urine dipstick SMALL (*)    Bilirubin Urine SMALL (*)    Ketones, ur 15 (*)    Protein, ur 30 (*)    All other components within normal limits  URINE MICROSCOPIC-ADD ON - Abnormal; Notable for the following:    Casts WBC CAST (*)    All other components within normal limits  CBG MONITORING, ED - Abnormal; Notable for the  following:    Glucose-Capillary 259 (*)    All other components within normal limits  LACTIC ACID, PLASMA    Imaging Review Dg Abd Acute W/chest  11/13/2014   CLINICAL DATA:  Generalized  abdominal pain with nausea and vomiting, 3 days duration.  EXAM: DG ABDOMEN ACUTE W/ 1V CHEST  COMPARISON:  CT 04/25/14  FINDINGS: There is no evidence of dilated bowel loops or free intraperitoneal air. No radiopaque calculi or other significant radiographic abnormality is seen. Heart size and mediastinal contours are within normal limits. Both lungs are clear.  IMPRESSION: Negative abdominal radiographs.  No acute cardiopulmonary disease.   Electronically Signed   By: Andreas Newport M.D.   On: 11/13/2014 01:49   I have personally reviewed and evaluated these images and lab results as part of my medical decision-making.   EKG Interpretation   Date/Time:  Friday November 13 2014 02:06:26 EDT Ventricular Rate:  90 PR Interval:  140 QRS Duration: 102 QT Interval:  376 QTC Calculation: 459 R Axis:   65 Text Interpretation:  Normal sinus rhythm RSR' or QR pattern in V1  suggests right ventricular conduction delay Nonspecific T wave abnormality  Abnormal ECG No significant change since last tracing Confirmed by Treesa Mccully   MD, Honora Searson ([id number]) on 11/13/2014 2:10:49 AM      MDM   Final diagnoses:  Intractable vomiting with nausea, vomiting of unspecified type  History of diabetic gastroparesis  History of chronic pain  Dehydration   Patient presents with abdominal pain, nausea, and vomiting. History of drug-seeking behavior. Patient was not 100% forthcoming in his history. He failed to tell me he was in pain management and that he previously been seen at [facility] for the same. It was only after further questioning that he admitted to this. Repeat lab work was obtained. Patient was given fluids, Reglan, Protonix, and Bentyl. On multiple occasions he continues to ask for IV pain medication. It was  discussed with the patient that this was not advisable given his history of gastroparesis.  2:16 AM The patient's lab reviewed. No evidence of DKA. Does appear dehydrated with mild hyponatremia and AKI. Also noted to have leukocytosis to 20.5. However, he appears hemoconcentrated and and at least part of his leukocytosis may be secondary to dehydration and hemoconcentration. Patient is requesting IV narcotics multiple times. He was dosed Reglan, Protonix, and was given 2 L of fluid. He was offered oral Bentyl and Percocet. On multiple occasions, I discussed with the patient that I did not feel narcotic pain medication was appropriate given his history of chronic pain and gastroparesis. Patient reports ongoing pain and nausea. EKG was obtained and shows no evidence of QT prolongation. 2 mg of IV Haldol was ordered for patient's symptoms. We'll continue to monitor and hydrate. Patient may require admission if unable to tolerate fluids.  Abdominal labs including lactate are reassuring. Patient had a normal abdominal ultrasound 2 days ago. Do not feel he needs further imaging at this time. No evidence of obstruction or abnormality on plain films.  3:12 AM Patient reports continued pain and nausea. Inability to tolerate liquids. Patient was offered admission for hydration and symptom control for his gastroparesis. I once again reinforced that I did not feel narcotic pain medications were appropriate for his treatment and I would be discussing this with the admitting hospitalist. Patient continues to state "well you need to give me something."  At this time, he is agreeable to admission.    Merryl Hacker, MD 11/13/14 [place], MD 11/13/14 [phone]

## 2014-11-13 NOTE — ED Notes (Signed)
Pt c/o generalized abd pain with n/v that started three days ago with elevated blood sugar's, pt arrived to er by ems had been given zofran 4mg  iv and 500NS bolus given as well.

## 2014-11-13 NOTE — ED Notes (Signed)
Pt given warm blanket, lights dimmed for comfort,

## 2014-11-13 NOTE — Progress Notes (Signed)
Consult order for nephrology. Called Dr. Lowanda Foster and received response. Place on consult log.

## 2014-11-13 NOTE — Care Management Note (Signed)
Case Management Note  Patient Details  Name: Brent Daniels MRN: [id number] Date of Birth: [birth year]  Expected Discharge Date:  11/16/14               Expected Discharge Plan:  Home/Self Care  In-House Referral:  NA  Discharge planning Services  CM Consult  Post Acute Care Choice:  NA Choice offered to:  NA  DME Arranged:    DME Agency:     HH Arranged:    [place] Agency:     Status of Service:  Completed, signed off  Medicare Important Message Given:    Date Medicare IM Given:    Medicare IM give by:    Date Additional Medicare IM Given:    Additional Medicare Important Message give by:     If discussed at [place] of Stay Meetings, dates discussed:    Additional Comments: Pt is from home and ind with ADL's. Pt admitted with AKI. Pt plans to return home with self care at DC. No CM needs anticipated.  Sherald Barge, RN 11/13/2014, 1:27 PM

## 2014-11-13 NOTE — ED Notes (Signed)
Pt requesting additional haldol, Dr Dina Rich notified, no additional orders given

## 2014-11-13 NOTE — Progress Notes (Signed)
Summary of outside records The patient's records from [facility] from this week were obtained.  Evidently, the patient was admitted briefly to [facility] a few days ago.  At that time, he was complaining of abdominal pain, nausea, vomiting and hematemesis.  He had creatinine 1, bicarbonate 25, WBC 12, and total bili 1.7.  Right upper quadrant ultrasound showed no ductal dilatation.  Per his admission H&P, because of the hematemesis, the patient is being admitted for endoscopy. Around the time of admission he demanded narcotics repeatedly, and with the admitting physician explained that narcotics were not appropriate treatment for gastroparesis or UGIB the patient left AMA.  There was also the allegation in that note, that the patient was taking 4 Percocet per day up until a few days before admission, and it was the opinion of the admitting physician that the patient was possibly complaining of symptoms of opiate withdrawal. The patient did not endorse regular opiate use to me.

## 2014-11-13 NOTE — ED Notes (Signed)
Pt requesting pain medication, requesting to speak with EDP, advised pt that edp would be in very soon to see him, pt expressed understanding,

## 2014-11-13 NOTE — Progress Notes (Signed)
Patient seen and examined. Was admitted earlier today with abdominal pain,n,v and hyperglycemia. He continued to exhibit drug-seeking behavior. At one point he told me "just give me one shot of dilaudid and I will go home". I explained to him that narcotics are contraindicated in the treatment of gastroparesis to which he responded "others have done it before so I don't see why you can't". I offered him tylenol, ibuprofen, tramadol and toradol, all of which he professes to have allergies to. He has left AMA after all of his previous admission (including 3 days ago at [facility]) after he was refused narcotics. Not surprisingly, I later received a phone call by RN that he had signed out AMA. No prescriptions were provided.  Domingo Mend, MD [facility] Pager: [phone]

## 2014-11-13 NOTE — Consult Note (Signed)
Reason for Consult: Acute kidney injury Referring Physician: Dr. Kathyrn Drown is an 29 y.o. male.  HPI: He is a patient was long-standing history of diabetes, hypertension, diabetic gastropathy presently came with complaints of severe abdominal pain, nausea, vomiting for the last couple of days. Patient stated that he was not able to keep anything down and was not able to drink. When he was evaluated patient was found to have elevated BUN and creatinine hence consult is called. Patient does not have any previous history of renal failure, kidney stone. He states that his blood sugar is reasonably controlled.  Past Medical History  Diagnosis Date  . Diabetes mellitus     type 1  . Arthritis   . GERD (gastroesophageal reflux disease)   . PUD (peptic ulcer disease)   . Hypertension   . Heart murmur     valvular stenosis  . Neuropathy ([place])   . Diabetic gastroparesis associated with type 1 diabetes mellitus ([place])   . S/P arthroscopic knee surgery 07/04/2011  . Scoliosis 07/11/2012  . Chronic back pain   . Chronic abdominal pain   . Nausea and vomiting     chronic, recurrent  . Acute esophagitis   . Gastroparesis   . Erosive esophagitis 12/23/2013    "severe" per EGD   . Insomnia   . Anxiety   . Hypercholesteremia     Past Surgical History  Procedure Laterality Date  . Tympanostomy tube placement    . Knee arthroscopy  06/30/2011    Procedure: ARTHROSCOPY KNEE;  Surgeon: Carole Civil, MD;  Location: [facility];  Service: Orthopedics;  Laterality: Right;  diagnostic arthroscopy  . Esophagogastroduodenoscopy (egd) with propofol  06/17/2013    Dr. Oneida Alar: two gastric ulcers in fundus, mild antral gastritis, candida esophagitis  . Esophageal biopsy  06/17/2013    Procedure: GASTRIC ULCER AND ANTRAL BIOPSIES; ESOPHAGEAL BRUSHING;  Surgeon: Danie Binder, MD;  Location: [facility];  Service: Endoscopy;;  . Esophagogastroduodenoscopy (egd) with propofol N/A 09/11/2013    Dr. [name]:  abnormal hypopharynx, question massively enlarged tonsils, stomach full of food precluded exam, needs EGD for verification of ulcer healing at a later date  . Esophagogastroduodenoscopy (egd) with propofol N/A 12/23/2013    RMR: Severe exudative esophagitis likely predominatly reflux related. Superimposed Candida infection not excluded status post KOH brushing and biopsy. Localized excoriating gastric mucosa most consistant with trauma(vomiting). No evidence of peptic ulcer disease or other gastric/duodenal pathology. I suspect severe inflammation involving the distal esophagus may account  for at least some of patii  . Esophageal biopsy N/A 12/23/2013    Procedure: ESOPHAGEAL BIOPSY;  Surgeon: Daneil Dolin, MD;  Location: [facility];  Service: Endoscopy;  Laterality: N/A;  . Cholecystectomy N/A 01/28/2014    Procedure: LAPAROSCOPIC CHOLECYSTECTOMY;  Surgeon: Jamesetta So, MD;  Location: [facility];  Service: General;  Laterality: N/A;    Family History  Problem Relation Age of Onset  . Cancer Father   . Alcohol abuse Father   . Pseudochol deficiency Neg Hx   . Malignant hyperthermia Neg Hx   . Hypotension Neg Hx   . Anesthesia problems Neg Hx   . Colon cancer Neg Hx     Social History:  reports that he has been smoking Cigarettes.  He has a 2.5 pack-year smoking history. He quit smokeless tobacco use about 20 months ago. He reports that he does not drink alcohol or use illicit drugs.  Allergies:  Allergies  Allergen Reactions  .  Hydrocodone Hives  . Tramadol Anaphylaxis and Other (See Comments)    Acid Reflux  . Ibuprofen Other (See Comments)    Stomach ulcers  . Naproxen Other (See Comments)    Stomach ulcers  . Sulfa Antibiotics Other (See Comments)    Stomach ulcers  . Morphine And Related Rash    Medications: I have reviewed the patient's current medications.  Results for orders placed or performed during the hospital encounter of 11/13/14 (from the past 48 hour(s))  POC CBG,  ED     Status: Abnormal   Collection Time: 11/13/14 12:26 AM  Result Value Ref Range   Glucose-Capillary 259 (H) 65 - 99 mg/dL  CBC with Differential     Status: Abnormal   Collection Time: 11/13/14 12:54 AM  Result Value Ref Range   WBC 20.5 (H) 4.0 - 10.5 K/uL   RBC 4.72 4.22 - 5.81 MIL/uL   Hemoglobin 14.4 13.0 - 17.0 g/dL   HCT 42.0 39.0 - 52.0 %   MCV 89.0 78.0 - 100.0 fL   MCH 30.5 26.0 - 34.0 pg   MCHC 34.3 30.0 - 36.0 g/dL   RDW 13.9 11.5 - 15.5 %   Platelets 407 (H) 150 - 400 K/uL   Neutrophils Relative % 83 %   Neutro Abs 16.9 (H) 1.7 - 7.7 K/uL   Lymphocytes Relative 11 %   Lymphs Abs 2.2 0.7 - 4.0 K/uL   Monocytes Relative 6 %   Monocytes Absolute 1.3 (H) 0.1 - 1.0 K/uL   Eosinophils Relative 0 %   Eosinophils Absolute 0.1 0.0 - 0.7 K/uL   Basophils Relative 0 %   Basophils Absolute 0.1 0.0 - 0.1 K/uL  Comprehensive metabolic panel     Status: Abnormal   Collection Time: 11/13/14 12:54 AM  Result Value Ref Range   Sodium 134 (L) 135 - 145 mmol/L   Potassium 3.8 3.5 - 5.1 mmol/L   Chloride 85 (L) 101 - 111 mmol/L   CO2 37 (H) 22 - 32 mmol/L   Glucose, Bld 285 (H) 65 - 99 mg/dL   BUN 38 (H) 6 - 20 mg/dL   Creatinine, Ser 1.39 (H) 0.61 - 1.24 mg/dL   Calcium 10.0 8.9 - 10.3 mg/dL   Total Protein 8.5 (H) 6.5 - 8.1 g/dL   Albumin 5.0 3.5 - 5.0 g/dL   AST 20 15 - 41 U/L   ALT 19 17 - 63 U/L   Alkaline Phosphatase 109 38 - 126 U/L   Total Bilirubin 2.6 (H) 0.3 - 1.2 mg/dL   GFR calc non Af Amer >60 >60 mL/min   GFR calc Af Amer >60 >60 mL/min    Comment: (NOTE) The eGFR has been calculated using the CKD EPI equation. This calculation has not been validated in all clinical situations. eGFR's persistently <60 mL/min signify possible Chronic Kidney Disease.    Anion gap 12 5 - 15  Lipase, blood     Status: Abnormal   Collection Time: 11/13/14 12:54 AM  Result Value Ref Range   Lipase 11 (L) 22 - 51 U/L  Lactic acid, plasma     Status: None   Collection Time:  11/13/14 12:54 AM  Result Value Ref Range   Lactic Acid, Venous 1.0 0.5 - 2.0 mmol/L  Bilirubin, direct     Status: None   Collection Time: 11/13/14 12:54 AM  Result Value Ref Range   Bilirubin, Direct 0.3 0.1 - 0.5 mg/dL  Urinalysis, Routine w reflex microscopic (not at  [facility])     Status: Abnormal   Collection Time: 11/13/14  1:55 AM  Result Value Ref Range   Color, Urine AMBER (A) YELLOW    Comment: BIOCHEMICALS MAY BE AFFECTED BY COLOR   APPearance CLEAR CLEAR   Specific Gravity, Urine 1.025 1.005 - 1.030   pH 6.0 5.0 - 8.0   Glucose, UA >1000 (A) NEGATIVE mg/dL   Hgb urine dipstick SMALL (A) NEGATIVE   Bilirubin Urine SMALL (A) NEGATIVE   Ketones, ur 15 (A) NEGATIVE mg/dL   Protein, ur 30 (A) NEGATIVE mg/dL   Urobilinogen, UA 1.0 0.0 - 1.0 mg/dL   Nitrite NEGATIVE NEGATIVE   Leukocytes, UA NEGATIVE NEGATIVE  Urine microscopic-add on     Status: Abnormal   Collection Time: 11/13/14  1:55 AM  Result Value Ref Range   Squamous Epithelial / LPF FEW (A) RARE    Comment: CORRECTED ON 10/07 AT 0309: PREVIOUSLY REPORTED AS RARE   WBC, UA 0-2 <3 WBC/hpf   RBC / HPF 0-2 <3 RBC/hpf   Bacteria, UA MANY (A) RARE    Comment: CORRECTED ON 10/07 AT 0309: PREVIOUSLY REPORTED AS RARE   Casts WBC CAST (A) NEGATIVE    Dg Abd Acute W/chest  11/13/2014   CLINICAL DATA:  Generalized abdominal pain with nausea and vomiting, 3 days duration.  EXAM: DG ABDOMEN ACUTE W/ 1V CHEST  COMPARISON:  CT 04/25/14  FINDINGS: There is no evidence of dilated bowel loops or free intraperitoneal air. No radiopaque calculi or other significant radiographic abnormality is seen. Heart size and mediastinal contours are within normal limits. Both lungs are clear.  IMPRESSION: Negative abdominal radiographs.  No acute cardiopulmonary disease.   Electronically Signed   By: Andreas Newport M.D.   On: 11/13/2014 01:49    Review of Systems  Constitutional: Negative for fever and chills.  Respiratory: Negative for cough.    Cardiovascular: Negative for orthopnea and leg swelling.  Gastrointestinal: Positive for nausea, vomiting and abdominal pain. Negative for diarrhea and constipation.   Blood pressure 193/103, pulse 88, temperature 99 F (37.2 C), temperature source Oral, resp. rate 20, height 6' 1.5" (1.867 m), weight 182 lb 1.6 oz (82.6 kg), SpO2 98 %. Physical Exam  Constitutional: He is oriented to person, place, and time. No distress.  Eyes: No scleral icterus.  Neck: No JVD present.  Cardiovascular: Normal rate and regular rhythm.   Respiratory: No respiratory distress. He has no wheezes. He has no rales.  GI: He exhibits no distension. There is tenderness. There is no rebound and no guarding.  Musculoskeletal: He exhibits no edema.  Neurological: He is alert and oriented to person, place, and time.    Assessment/Plan: Problem #1 acute kidney injury: Presently his BUN and creatinine seems to be high. His last creatinine was normal. This elevation is possibly secondary to severe dehydration/ATN. Problem #2 diabetes: His blood sugar is 285 which seems to be high. Patient also with glycosuria and kept on board since the urine. Presently he doesn't have any anion gap in the blood possibly secondary to loss of ketone bodies in the urine Problem #3 nausea and vomiting: This is a recurrent problem. Patient with diabetic gastropathy and has been admitted to the hospital Of times. His followed by GI. Patient is asking for IV pain medication so that he will feel better and go home. Problem #4 hypertension: His blood pressure is high but fluctuating. Problem #5 leukocytosis Problem #6 proteinuria: Presently patient with high urine specific gravity hence  concentrated. Previously no history of proteinuria. Possibly secondary to microalbuminuria related to his diabetes. Plan: Agree with hydration We'll decrease IV fluid to 135 mL per hour We'll check urine albumen to creatinine ratio.  Darnesha Diloreto  S 11/13/2014, 10:02 AM

## 2014-11-13 NOTE — ED Notes (Signed)
Pt spitting in emesis bag, refuses percocet, bentyl, requesting dilaudid, states " i am throwing up, I need dilaudid for my abd pain", explained to pt that I would given him the reglan, let it start working and then administer the other two medications, pt refused, states "i needed pani medication 10 minutes ago",

## 2014-11-13 NOTE — Progress Notes (Signed)
Patient signed AMA papers against medical advice. Patient dressed and is waiting for his ride. All IV access was removed.

## 2014-11-13 NOTE — ED Notes (Signed)
Dr Loleta Books (hospitalist) at bedside,

## 2014-11-14 LAB — HEMOGLOBIN A1C
HEMOGLOBIN A1C: 10 % — AB (ref 4.8–5.6)
Mean Plasma Glucose: 240 mg/dL

## 2015-04-19 ENCOUNTER — Telehealth: Payer: Self-pay

## 2015-04-19 NOTE — Telephone Encounter (Signed)
Pt called the office today wanting to be referred to a pain clinic. States he was having issues with the previous center we referred him to.   Routing to [employer]

## 2015-04-20 NOTE — Telephone Encounter (Signed)
I have not seen the patient since March 2016. We referred him to [facility], and he was seen by Dr. Nyoka Cowden but I don't think he followed up? We can't refer to another Pain clinic because of "issues" with the one we referred him to. Need more details on why there are issues.

## 2015-04-20 NOTE — Telephone Encounter (Signed)
Called pt and he states that the doctor didn't believe that he was taking his medications because he was vomiting so much. States he was taking his medications and that his mother had them (pills) under lock and key and would give them to him.   Routing to [employer]

## 2015-04-20 NOTE — Telephone Encounter (Signed)
Sounds like he just needs a routine office visit with [place]. I can't do anything different without seeing in person since it has been so long.

## 2015-04-20 NOTE — Telephone Encounter (Signed)
Called and spoke with pt. He has an appt with Vicente Males on 03/28 @ 2:00pm

## 2015-05-04 ENCOUNTER — Ambulatory Visit: Payer: Self-pay | Admitting: Gastroenterology

## 2015-05-05 ENCOUNTER — Ambulatory Visit: Payer: Self-pay | Admitting: Gastroenterology

## 2015-05-14 ENCOUNTER — Other Ambulatory Visit: Payer: Self-pay

## 2015-05-14 ENCOUNTER — Encounter: Payer: Self-pay | Admitting: Gastroenterology

## 2015-05-14 ENCOUNTER — Ambulatory Visit (INDEPENDENT_AMBULATORY_CARE_PROVIDER_SITE_OTHER): Payer: Managed Care, Other (non HMO) | Admitting: Gastroenterology

## 2015-05-14 VITALS — BP 133/86 | HR 108 | Temp 97.0°F | Ht 73.0 in | Wt 201.2 lb

## 2015-05-14 DIAGNOSIS — K3184 Gastroparesis: Secondary | ICD-10-CM

## 2015-05-14 DIAGNOSIS — K279 Peptic ulcer, site unspecified, unspecified as acute or chronic, without hemorrhage or perforation: Secondary | ICD-10-CM | POA: Diagnosis not present

## 2015-05-14 DIAGNOSIS — G8929 Other chronic pain: Secondary | ICD-10-CM

## 2015-05-14 DIAGNOSIS — K2100 Gastro-esophageal reflux disease with esophagitis, without bleeding: Secondary | ICD-10-CM | POA: Insufficient documentation

## 2015-05-14 DIAGNOSIS — R1084 Generalized abdominal pain: Secondary | ICD-10-CM | POA: Diagnosis not present

## 2015-05-14 NOTE — Progress Notes (Signed)
Primary Care Physician: [name], MD  Primary Gastroenterologist:  Garfield Cornea, MD   Chief Complaint  Patient presents with  . Follow-up    HPI: Brent Daniels is a 30 y.o. male here for follow-up of chronic abdominal pain, nausea and vomiting. He has a history of diabetic gastroparesis, poorly controlled diabetes, severe reflux esophagitis, peptic ulcer disease, right upper quadrant pain persistent status post cholecystectomy previously. Also history of oral candidiasis previously treated. We last saw him in March 2016. Patient has been referred to [facility] previously. Unfortunately he did not follow through with the recommendations. Recently discharged from his pain clinic. Patient states that he tested to have no medication in the system over 2 months so he was dismissed. He states he has daily vomiting (once daily) which explains the lack of oxycodone in his system on his blood work.  Patient states he has been in the hospital three times in the past couple of weeks due to vomiting related to abdominal pain, DKAs. States he vomiting when he is in pain. No hematemesis. Denies heartburn. Bowel movement 3 times per week. No blood in the stool or melena. No dysphagia. Entire abdomen hurts all the time. Vomits almost every morning. Takes pantoprazole twice a day. Takes Reglan 4 times daily.  Weight gain of 20 pounds in the past one year.   Current Outpatient Prescriptions  Medication Sig Dispense Refill  . amLODipine (NORVASC) 10 MG tablet     . gabapentin (NEURONTIN) 800 MG tablet Take 800 mg by mouth 3 (three) times daily.    . insulin NPH-regular Human (NOVOLIN 70/30) (70-30) 100 UNIT/ML injection Inject 20 Units into the skin 2 (two) times daily with a meal. 15 units in the morning and 25 at night. (Patient taking differently: Inject 15-25 Units into the skin 2 (two) times daily with a meal. 15 units in the morning and 25 at night.) 10 mL 11  . insulin regular (NOVOLIN  R,HUMULIN R) 100 units/mL injection Inject 2-10 Units into the skin 3 (three) times daily before meals. Sliding scale.    Marland Kitchen lisinopril (PRINIVIL,ZESTRIL) 20 MG tablet Take 20 mg by mouth daily.    . metoCLOPramide (REGLAN) 10 MG tablet 10 mg 4 (four) times daily -  before meals and at bedtime.    . pantoprazole (PROTONIX) 40 MG tablet Take 1 tablet (40 mg total) by mouth 2 (two) times daily before a meal. 180 tablet 3  . tiZANidine (ZANAFLEX) 4 MG tablet 4 mg every 8 (eight) hours as needed.    . pravastatin (PRAVACHOL) 20 MG tablet Take 20 mg by mouth daily.      No current facility-administered medications for this visit.    Allergies as of 05/14/2015 - Review Complete 05/14/2015  Allergen Reaction Noted  . Hydrocodone Hives 08/23/2013  . Tramadol Anaphylaxis and Other (See Comments) 10/14/2013  . Ibuprofen Other (See Comments) 08/14/2011  . Naproxen Other (See Comments) 08/14/2011  . Sulfa antibiotics Other (See Comments) 08/14/2011  . Morphine and related Rash 01/04/2012   Past Medical History  Diagnosis Date  . Diabetes mellitus     type 1  . Arthritis   . GERD (gastroesophageal reflux disease)   . PUD (peptic ulcer disease)   . Hypertension   . Heart murmur     valvular stenosis  . Neuropathy ([place])   . Diabetic gastroparesis associated with type 1 diabetes mellitus ([place])   . S/P arthroscopic knee surgery 07/04/2011  . Scoliosis  07/11/2012  . Chronic back pain   . Chronic abdominal pain   . Nausea and vomiting     chronic, recurrent  . Acute esophagitis   . Gastroparesis   . Erosive esophagitis 12/23/2013    "severe" per EGD   . Insomnia   . Anxiety   . Hypercholesteremia    Past Surgical History  Procedure Laterality Date  . Tympanostomy tube placement    . Knee arthroscopy  06/30/2011    Procedure: ARTHROSCOPY KNEE;  Surgeon: Carole Civil, MD;  Location: [facility];  Service: Orthopedics;  Laterality: Right;  diagnostic arthroscopy  . Esophagogastroduodenoscopy  (egd) with propofol  06/17/2013    Dr. Oneida Alar: two gastric ulcers in fundus, mild antral gastritis, candida esophagitis  . Biopsy  06/17/2013    Procedure: GASTRIC ULCER AND ANTRAL BIOPSIES; ESOPHAGEAL BRUSHING;  Surgeon: Danie Binder, MD;  Location: [facility];  Service: Endoscopy;;  . Esophagogastroduodenoscopy (egd) with propofol N/A 09/11/2013    Dr. [name]: abnormal hypopharynx, question massively enlarged tonsils, stomach full of food precluded exam, needs EGD for verification of ulcer healing at a later date  . Esophagogastroduodenoscopy (egd) with propofol N/A 12/23/2013    RMR: Severe exudative esophagitis likely predominatly reflux related. Superimposed Candida infection not excluded status post KOH brushing and biopsy. Localized excoriating gastric mucosa most consistant with trauma(vomiting). No evidence of peptic ulcer disease or other gastric/duodenal pathology. I suspect severe inflammation involving the distal esophagus may account  for at least some of patii  . Biopsy N/A 12/23/2013    Procedure: ESOPHAGEAL BIOPSY;  Surgeon: Daneil Dolin, MD;  Location: [facility];  Service: Endoscopy;  Laterality: N/A;  . Cholecystectomy N/A 01/28/2014    Procedure: LAPAROSCOPIC CHOLECYSTECTOMY;  Surgeon: Jamesetta So, MD;  Location: [facility];  Service: General;  Laterality: N/A;   Family History  Problem Relation Age of Onset  . Cancer Father   . Alcohol abuse Father   . Pseudochol deficiency Neg Hx   . Malignant hyperthermia Neg Hx   . Hypotension Neg Hx   . Anesthesia problems Neg Hx   . Colon cancer Neg Hx    Social History   Social History  . Marital Status: Single    Spouse Name: N/A  . Number of Children: N/A  . Years of Education: 12   Occupational History  . unemployed     trying to get disability   Social History Main Topics  . Smoking status: Current Some Day Smoker -- 0.25 packs/day for 10 years    Types: Cigarettes  . Smokeless tobacco: Former Systems developer    Quit date: 02/17/2013      Comment: smokes 4 cigarettes a day   . Alcohol Use: No  . Drug Use: No  . Sexual Activity: Not Currently   Other Topics Concern  . None   Social History Narrative    ROS:  General: Negative for anorexia, weight loss, fever, chills, fatigue, weakness. ENT: Negative for hoarseness, difficulty swallowing , nasal congestion. CV: Negative for chest pain, angina, palpitations, dyspnea on exertion, peripheral edema.  Respiratory: Negative for dyspnea at rest, dyspnea on exertion, cough, sputum, wheezing.  GI: See history of present illness. GU:  Negative for dysuria, hematuria, urinary incontinence, urinary frequency, nocturnal urination.  Endo: Negative for unusual weight change.    Physical Examination:   BP 133/86 mmHg  Pulse 108  Temp(Src) 97 F (36.1 C) (Oral)  Ht 6\' 1"  (1.854 m)  Wt 201 lb 3.2 oz (91.264 kg)  BMI 26.55 kg/m2  General: Well-nourished, well-developed in no acute distress.  Eyes: No icterus. Mouth: Oropharyngeal mucosa moist and pink , no lesions erythema or exudate. Lungs: Clear to auscultation bilaterally.  Heart: Regular rate and rhythm, no murmurs rubs or gallops.  Abdomen: Bowel sounds are normal, nontender, nondistended, no hepatosplenomegaly or masses, no abdominal bruits or hernia , no rebound or guarding.   Extremities: No lower extremity edema. No clubbing or deformities. Neuro: Alert and oriented x 4   Skin: Warm and dry, no jaundice.   Psych: Alert and cooperative, normal mood and affect.  Labs:  January 2017: Hemoglobin A1c 10.6, hemoglobin 11.6, hematocrit 33.6, platelets 533,000, white blood cell count 11,600, BUN 13, creatinine 0.83, total bilirubin 0.2, alkaline phosphatase 94, AST 18, ALT 16, albumin 3.7  Imaging Studies: No results found.

## 2015-05-14 NOTE — Patient Instructions (Addendum)
1. Upper endoscopy to further evaluate your stomach/esophagus. See separate instructions.  2. Referral to pain clinic.

## 2015-05-14 NOTE — Assessment & Plan Note (Signed)
30 year old gentleman with history of poorly controlled diabetes, diabetic gastroparesis, severe reflux esophagitis, candida esophagitis, peptic ulcer disease who presents for chronic abdominal pain, recurrent vomiting. Patient was dismissed from his pain clinic, according to the patient because he tested negative for the drug he was being prescribed. Presents today specifically to discuss pain management referral and requesting narcotics.  Due to patient's repeat current vomiting, prior upper GI histories I have recommended upper endoscopy for reevaluation. Exam to be done in the OR with deep sedation due to history of narcotic use.  I have discussed the risks, alternatives, benefits with regards to but not limited to the risk of reaction to medication, bleeding, infection, perforation and the patient is agreeable to proceed. Written consent to be obtained.  He will continue pantoprazole twice a day. We will go ahead and initiate referral to another pain clinic.  I refuse to provide narcotics to the patient today. We verified that he received oxycodone/acetaminophen 3 days ago at [facility], #[facility]. This was provided by his PCP Dr. Neta Mends on April 4 when the patient was seen in his office. He received 150 back in February, which was his last prescription from the pain clinic.  I confronted the patient regarding this finding, he told me that his mother had called for another prescription for him last week which is completely inconsistent with noted findings in Epic. Patient was advised that he would not be receiving narcotics from this office. He appeared frustrated with this decision.

## 2015-05-17 ENCOUNTER — Other Ambulatory Visit: Payer: Self-pay

## 2015-05-17 DIAGNOSIS — G8929 Other chronic pain: Secondary | ICD-10-CM

## 2015-05-17 DIAGNOSIS — R109 Unspecified abdominal pain: Principal | ICD-10-CM

## 2015-05-17 NOTE — Progress Notes (Signed)
CC'ED TO PCP 

## 2015-05-25 NOTE — Patient Instructions (Signed)
Brent Daniels  05/25/2015     @PREFPERIOPPHARMACY @   Your procedure is scheduled on  05/31/2015   Report to [facility] at  615  A.M.  Call this number if you have problems the morning of surgery:  [phone]   Remember:  Do not eat food or drink liquids after midnight.  Take these medicines the morning of surgery with A SIP OF WATER  Amlodipine, neurontin, lisinopril, reglan, protonix, zanaflex. Take 1/2 of your insulin the night before your procedure and NO MEDICINE the morning of your procedure.   Do not wear jewelry, make-up or nail polish.  Do not wear lotions, powders, or perfumes.  You may wear deodorant.  Do not shave 48 hours prior to surgery.  Men may shave face and neck.  Do not bring valuables to the hospital.  [facility] is not responsible for any belongings or valuables.  Contacts, dentures or bridgework may not be worn into surgery.  Leave your suitcase in the car.  After surgery it may be brought to your room.  For patients admitted to the hospital, discharge time will be determined by your treatment team.  Patients discharged the day of surgery will not be allowed to drive home.   Name and phone number of your driver:   family Special instructions:  Follow the diet instructions given to you by Dr Roseanne Kaufman office.  Please read over the following fact sheets that you were given. Coughing and Deep Breathing, Surgical Site Infection Prevention, Anesthesia Post-op Instructions and Care and Recovery After Surgery      Esophagogastroduodenoscopy Esophagogastroduodenoscopy (EGD) is a procedure that is used to examine the lining of the esophagus, stomach, and first part of the small intestine (duodenum). A long, flexible, lighted tube with a camera attached (endoscope) is inserted down the throat to view these organs. This procedure is done to detect problems or abnormalities, such as inflammation, bleeding, ulcers, or growths, in order to treat them. The  procedure lasts 5-20 minutes. It is usually an outpatient procedure, but it may need to be performed in a hospital in emergency cases. LET [facility] CARE PROVIDER KNOW ABOUT:  Any allergies you have.  All medicines you are taking, including vitamins, herbs, eye drops, creams, and over-the-counter medicines.  Previous problems you or members of your family have had with the use of anesthetics.  Any blood disorders you have.  Previous surgeries you have had.  Medical conditions you have. RISKS AND COMPLICATIONS Generally, this is a safe procedure. However, problems can occur and include:  Infection.  Bleeding.  Tearing (perforation) of the esophagus, stomach, or duodenum.  Difficulty breathing or not being able to breathe.  Excessive sweating.  Spasms of the larynx.  Slowed heartbeat.  Low blood pressure. BEFORE THE PROCEDURE  Do not eat or drink anything after midnight on the night before the procedure or as directed by your health care provider.  Do not take your regular medicines before the procedure if your health care provider asks you not to. Ask your health care provider about changing or stopping those medicines.  If you wear dentures, be prepared to remove them before the procedure.  Arrange for someone to drive you home after the procedure. PROCEDURE  A numbing medicine (local anesthetic) may be sprayed in your throat for comfort and to stop you from gagging or coughing.  You will have an IV tube inserted in a vein in your hand or arm.  You will receive medicines and fluids through this tube.  You will be given a medicine to relax you (sedative).  A pain reliever will be given through the IV tube.  A mouth guard may be placed in your mouth to protect your teeth and to keep you from biting on the endoscope.  You will be asked to lie on your left side.  The endoscope will be inserted down your throat and into your esophagus, stomach, and duodenum.  Air  will be put through the endoscope to allow your health care provider to clearly view the lining of your esophagus.  The lining of your esophagus, stomach, and duodenum will be examined. During the exam, your health care provider may:  Remove tissue to be examined under a microscope (biopsy) for inflammation, infection, or other medical problems.  Remove growths.  Remove objects (foreign bodies) that are stuck.  Treat any bleeding with medicines or other devices that stop tissues from bleeding (hot cautery, clipping devices).  Widen (dilate) or stretch narrowed areas of your esophagus and stomach.  The endoscope will be withdrawn. AFTER THE PROCEDURE  You will be taken to a recovery area for observation. Your blood pressure, heart rate, breathing rate, and blood oxygen level will be monitored often until the medicines you were given have worn off.  Do not eat or drink anything until the numbing medicine has worn off and your gag reflex has returned. You may choke.  Your health care provider should be able to discuss his or her findings with you. It will take longer to discuss the test results if any biopsies were taken.   This information is not intended to replace advice given to you by your health care provider. Make sure you discuss any questions you have with your health care provider.   Document Released: 05/26/2004 Document Revised: 02/13/2014 Document Reviewed: 12/27/2011 Elsevier Interactive Patient Education 2016 [place]. Esophagogastroduodenoscopy, Care After Refer to this sheet in the next few weeks. These instructions provide you with information about caring for yourself after your procedure. Your health care provider may also give you more specific instructions. Your treatment has been planned according to current medical practices, but problems sometimes occur. Call your health care provider if you have any problems or questions after your procedure. WHAT TO EXPECT  AFTER THE PROCEDURE After your procedure, it is typical to feel:  Soreness in your throat.  Pain with swallowing.  Sick to your stomach (nauseous).  Bloated.  Dizzy.  Fatigued. HOME CARE INSTRUCTIONS  Do not eat or drink anything until the numbing medicine (local anesthetic) has worn off and your gag reflex has returned. You will know that the local anesthetic has worn off when you can swallow comfortably.  Do not drive or operate machinery until directed by your health care provider.  Take medicines only as directed by your health care provider. SEEK MEDICAL CARE IF:   You cannot stop coughing.  You are not urinating at all or less than usual. SEEK IMMEDIATE MEDICAL CARE IF:  You have difficulty swallowing.  You cannot eat or drink.  You have worsening throat or chest pain.  You have dizziness or lightheadedness or you faint.  You have nausea or vomiting.  You have chills.  You have a fever.  You have severe abdominal pain.  You have black, tarry, or bloody stools.   This information is not intended to replace advice given to you by your health care provider. Make sure you discuss any  questions you have with your health care provider.   Document Released: 01/10/2012 Document Revised: 02/13/2014 Document Reviewed: 01/10/2012 Elsevier Interactive Patient Education 2016 [employer] PATIENT INSTRUCTIONS POST-ANESTHESIA  IMMEDIATELY FOLLOWING SURGERY:  Do not drive or operate machinery for the first twenty four hours after surgery.  Do not make any important decisions for twenty four hours after surgery or while taking narcotic pain medications or sedatives.  If you develop intractable nausea and vomiting or a severe headache please notify your doctor immediately.  FOLLOW-UP:  Please make an appointment with your surgeon as instructed. You do not need to follow up with anesthesia unless specifically instructed to do so.  WOUND CARE INSTRUCTIONS (if applicable):   Keep a dry clean dressing on the anesthesia/puncture wound site if there is drainage.  Once the wound has quit draining you may leave it open to air.  Generally you should leave the bandage intact for twenty four hours unless there is drainage.  If the epidural site drains for more than 36-48 hours please call the anesthesia department.  QUESTIONS?:  Please feel free to call your physician or the hospital operator if you have any questions, and they will be happy to assist you.

## 2015-05-28 ENCOUNTER — Encounter (HOSPITAL_COMMUNITY): Payer: Self-pay

## 2015-05-28 ENCOUNTER — Encounter (HOSPITAL_COMMUNITY)
Admission: RE | Admit: 2015-05-28 | Discharge: 2015-05-28 | Disposition: A | Discharge status: Home / Self Care | Payer: Managed Care, Other (non HMO) | Source: Ambulatory Visit | Attending: Internal Medicine | Admitting: Internal Medicine

## 2015-05-28 DIAGNOSIS — Z01812 Encounter for preprocedural laboratory examination: Secondary | ICD-10-CM | POA: Insufficient documentation

## 2015-05-28 LAB — CBC WITH DIFFERENTIAL/PLATELET
BASOS PCT: 1 %
Basophils Absolute: 0.1 10*3/uL (ref 0.0–0.1)
EOS ABS: 0.3 10*3/uL (ref 0.0–0.7)
EOS PCT: 3 %
HEMATOCRIT: 32.4 % — AB (ref 39.0–52.0)
Hemoglobin: 10.9 g/dL — ABNORMAL LOW (ref 13.0–17.0)
Lymphocytes Relative: 32 %
Lymphs Abs: 3.3 10*3/uL (ref 0.7–4.0)
MCH: 29.6 pg (ref 26.0–34.0)
MCHC: 33.6 g/dL (ref 30.0–36.0)
MCV: 88 fL (ref 78.0–100.0)
MONO ABS: 0.4 10*3/uL (ref 0.1–1.0)
MONOS PCT: 4 %
NEUTROS ABS: 6 10*3/uL (ref 1.7–7.7)
Neutrophils Relative %: 60 %
Platelets: 455 10*3/uL — ABNORMAL HIGH (ref 150–400)
RBC: 3.68 MIL/uL — ABNORMAL LOW (ref 4.22–5.81)
RDW: 14.9 % (ref 11.5–15.5)
WBC: 10.1 10*3/uL (ref 4.0–10.5)

## 2015-05-28 LAB — BASIC METABOLIC PANEL
Anion gap: 8 (ref 5–15)
BUN: 20 mg/dL (ref 6–20)
CALCIUM: 9.6 mg/dL (ref 8.9–10.3)
CO2: 28 mmol/L (ref 22–32)
CREATININE: 1.08 mg/dL (ref 0.61–1.24)
Chloride: 101 mmol/L (ref 101–111)
GFR calc Af Amer: >60 mL/min (ref >60)
GFR calc non Af Amer: >60 mL/min (ref >60)
Glucose, Bld: 219 mg/dL — ABNORMAL HIGH (ref 65–99)
Potassium: 4.5 mmol/L (ref 3.5–5.1)
Sodium: 137 mmol/L (ref 135–145)

## 2015-05-31 ENCOUNTER — Ambulatory Visit (HOSPITAL_COMMUNITY)
Admission: RE | Admit: 2015-05-31 | Payer: Managed Care, Other (non HMO) | Source: Ambulatory Visit | Admitting: Internal Medicine

## 2015-05-31 ENCOUNTER — Encounter (HOSPITAL_COMMUNITY): Admission: RE | Payer: Self-pay | Source: Ambulatory Visit

## 2015-05-31 ENCOUNTER — Telehealth: Payer: Self-pay

## 2015-05-31 ENCOUNTER — Other Ambulatory Visit: Payer: Self-pay

## 2015-05-31 SURGERY — ESOPHAGOGASTRODUODENOSCOPY (EGD) WITH PROPOFOL
Anesthesia: Monitor Anesthesia Care

## 2015-05-31 NOTE — Telephone Encounter (Signed)
Pt did not show for his EGD this morning

## 2015-05-31 NOTE — Telephone Encounter (Signed)
Noted. No further recommendations.  

## 2015-06-04 ENCOUNTER — Encounter (HOSPITAL_COMMUNITY): Admission: RE | Admit: 2015-06-04 | Payer: Managed Care, Other (non HMO) | Source: Ambulatory Visit

## 2015-06-07 ENCOUNTER — Telehealth: Payer: Self-pay | Admitting: Internal Medicine

## 2015-06-07 NOTE — Telephone Encounter (Signed)
Called pt unable to leave message.  Called patients mother Lattie Haw and spoke with her. She states that pt is an inpatient at [facility] due to his Blood sugar being high.    Informed mother about patients appt on 06/28/2015 @ 1:00pm with [facility].  Pt mother states she would relay the message and let us know when pt was release from the hospital.

## 2015-06-07 NOTE — Telephone Encounter (Signed)
Noted  

## 2015-06-07 NOTE — Telephone Encounter (Signed)
Kim from short stay called to say that she has tried calling the patient several times and has left messages for him as a reminder of his pre op on 5/4

## 2015-06-08 NOTE — OR Nursing (Signed)
Have made multiple attempts to call patient with time of surgery and medicines to take am of surgery and have been unable to reach him even though we have left many messages. Spoke with Candy Martinique at [facility] and she states she spoke with patient's mother 06/07/2015 and she is aware of what time patient is to arrive 06/10/2015 and patient is on schedule to have procedure done as planned.

## 2015-06-08 NOTE — Telephone Encounter (Signed)
Spoke with pt and he says he is aware of procedure on 05/04. Got released from hospital today

## 2015-06-09 ENCOUNTER — Telehealth: Payer: Self-pay

## 2015-06-09 MED ORDER — ONDANSETRON 4 MG PO TBDP: 4.0000 mg | ORAL_TABLET | ORAL | Status: DC | PRN

## 2015-06-09 NOTE — Telephone Encounter (Signed)
Pt is aware and will be there in the am

## 2015-06-09 NOTE — Telephone Encounter (Signed)
Pt called and is wanting to cancel his EGD because he is sick. He said that if we could call in some nausea medication that might help. Please advise

## 2015-06-09 NOTE — Telephone Encounter (Signed)
Would encourage his to have the EGD so we can try and figure out what is going on with him. Zofran sent to pharmacy.

## 2015-06-10 ENCOUNTER — Encounter (HOSPITAL_COMMUNITY): Payer: Self-pay | Admitting: Anesthesiology

## 2015-06-10 ENCOUNTER — Ambulatory Visit (HOSPITAL_COMMUNITY)
Admission: RE | Admit: 2015-06-10 | Payer: Managed Care, Other (non HMO) | Source: Ambulatory Visit | Admitting: Internal Medicine

## 2015-06-10 ENCOUNTER — Encounter (HOSPITAL_COMMUNITY): Admission: RE | Payer: Self-pay | Source: Ambulatory Visit

## 2015-06-10 SURGERY — ESOPHAGOGASTRODUODENOSCOPY (EGD) WITH PROPOFOL
Anesthesia: Monitor Anesthesia Care

## 2015-06-10 MED ORDER — LIDOCAINE VISCOUS 2 % MT SOLN: 15.0000 mL | Freq: Once | OROMUCOSAL | Status: DC

## 2015-06-10 MED ORDER — LIDOCAINE HCL (PF) 1 % IJ SOLN
INTRAMUSCULAR | Status: AC
  Filled 2015-06-10: qty 5

## 2015-06-10 MED ORDER — PROPOFOL 10 MG/ML IV BOLUS
INTRAVENOUS | Status: AC
  Filled 2015-06-10: qty 40

## 2015-06-10 NOTE — OR Nursing (Signed)
Patient called cancelled procedure , stating that he was sick and needed to cancel.

## 2015-06-30 ENCOUNTER — Encounter: Payer: Self-pay | Admitting: Gastroenterology

## 2015-06-30 ENCOUNTER — Telehealth: Payer: Self-pay | Admitting: Gastroenterology

## 2015-06-30 ENCOUNTER — Ambulatory Visit: Payer: Self-pay | Admitting: Gastroenterology

## 2015-06-30 NOTE — Telephone Encounter (Signed)
Patient was a no show and letter sent  °

## 2016-01-19 ENCOUNTER — Ambulatory Visit: Payer: Managed Care, Other (non HMO) | Admitting: Gastroenterology

## 2016-01-21 ENCOUNTER — Telehealth: Payer: Self-pay | Admitting: Gastroenterology

## 2016-01-21 ENCOUNTER — Encounter: Payer: Self-pay | Admitting: Gastroenterology

## 2016-01-21 ENCOUNTER — Ambulatory Visit: Payer: Managed Care, Other (non HMO) | Admitting: Gastroenterology

## 2016-01-21 NOTE — Telephone Encounter (Signed)
PATIENT WAS A NO SHOW AND LETTER SENT  °

## 2016-02-24 ENCOUNTER — Ambulatory Visit: Payer: Managed Care, Other (non HMO) | Admitting: Gastroenterology

## 2016-03-16 ENCOUNTER — Telehealth: Payer: Self-pay | Admitting: Gastroenterology

## 2016-03-16 ENCOUNTER — Ambulatory Visit: Payer: Managed Care, Other (non HMO) | Admitting: Gastroenterology

## 2016-03-16 NOTE — Telephone Encounter (Signed)
PATIENT WAS A NO SHOW X3 °

## 2016-03-17 NOTE — Telephone Encounter (Signed)
I think we should discharge, unless there is a reasonable explanation.

## 2016-03-21 ENCOUNTER — Encounter: Payer: Self-pay | Admitting: General Practice

## 2016-03-21 NOTE — Telephone Encounter (Signed)
Discharge letter will be mailed  

## 2016-08-23 ENCOUNTER — Encounter (HOSPITAL_COMMUNITY): Payer: Self-pay | Admitting: Emergency Medicine

## 2016-08-23 ENCOUNTER — Inpatient Hospital Stay (HOSPITAL_COMMUNITY)
Admission: EM | Admit: 2016-08-23 | Discharge: 2016-08-27 | DRG: 638 | Disposition: A | Discharge status: Home / Self Care | Payer: Managed Care, Other (non HMO) | Attending: Internal Medicine | Admitting: Internal Medicine

## 2016-08-23 DIAGNOSIS — G894 Chronic pain syndrome: Secondary | ICD-10-CM | POA: Diagnosis present

## 2016-08-23 DIAGNOSIS — E1022 Type 1 diabetes mellitus with diabetic chronic kidney disease: Secondary | ICD-10-CM | POA: Diagnosis present

## 2016-08-23 DIAGNOSIS — Z886 Allergy status to analgesic agent status: Secondary | ICD-10-CM

## 2016-08-23 DIAGNOSIS — E1100 Type 2 diabetes mellitus with hyperosmolarity without nonketotic hyperglycemic-hyperosmolar coma (NKHHC): Secondary | ICD-10-CM

## 2016-08-23 DIAGNOSIS — R109 Unspecified abdominal pain: Secondary | ICD-10-CM | POA: Diagnosis present

## 2016-08-23 DIAGNOSIS — E860 Dehydration: Secondary | ICD-10-CM | POA: Diagnosis present

## 2016-08-23 DIAGNOSIS — E1043 Type 1 diabetes mellitus with diabetic autonomic (poly)neuropathy: Secondary | ICD-10-CM | POA: Diagnosis present

## 2016-08-23 DIAGNOSIS — Z885 Allergy status to narcotic agent status: Secondary | ICD-10-CM

## 2016-08-23 DIAGNOSIS — Z882 Allergy status to sulfonamides status: Secondary | ICD-10-CM

## 2016-08-23 DIAGNOSIS — Z79899 Other long term (current) drug therapy: Secondary | ICD-10-CM

## 2016-08-23 DIAGNOSIS — N183 Chronic kidney disease, stage 3 (moderate): Secondary | ICD-10-CM | POA: Diagnosis present

## 2016-08-23 DIAGNOSIS — F1193 Opioid use, unspecified with withdrawal: Secondary | ICD-10-CM

## 2016-08-23 DIAGNOSIS — E870 Hyperosmolality and hypernatremia: Secondary | ICD-10-CM | POA: Diagnosis present

## 2016-08-23 DIAGNOSIS — K3184 Gastroparesis: Secondary | ICD-10-CM | POA: Diagnosis present

## 2016-08-23 DIAGNOSIS — R112 Nausea with vomiting, unspecified: Secondary | ICD-10-CM | POA: Diagnosis present

## 2016-08-23 DIAGNOSIS — E1065 Type 1 diabetes mellitus with hyperglycemia: Secondary | ICD-10-CM | POA: Diagnosis not present

## 2016-08-23 DIAGNOSIS — E1010 Type 1 diabetes mellitus with ketoacidosis without coma: Secondary | ICD-10-CM | POA: Diagnosis present

## 2016-08-23 DIAGNOSIS — Z720 Tobacco use: Secondary | ICD-10-CM | POA: Diagnosis present

## 2016-08-23 DIAGNOSIS — I10 Essential (primary) hypertension: Secondary | ICD-10-CM | POA: Diagnosis present

## 2016-08-23 DIAGNOSIS — N189 Chronic kidney disease, unspecified: Secondary | ICD-10-CM

## 2016-08-23 DIAGNOSIS — Z9119 Patient's noncompliance with other medical treatment and regimen: Secondary | ICD-10-CM

## 2016-08-23 DIAGNOSIS — E878 Other disorders of electrolyte and fluid balance, not elsewhere classified: Secondary | ICD-10-CM | POA: Diagnosis present

## 2016-08-23 DIAGNOSIS — IMO0002 Reserved for concepts with insufficient information to code with codable children: Secondary | ICD-10-CM | POA: Diagnosis present

## 2016-08-23 DIAGNOSIS — I129 Hypertensive chronic kidney disease with stage 1 through stage 4 chronic kidney disease, or unspecified chronic kidney disease: Secondary | ICD-10-CM | POA: Diagnosis present

## 2016-08-23 DIAGNOSIS — E1143 Type 2 diabetes mellitus with diabetic autonomic (poly)neuropathy: Secondary | ICD-10-CM

## 2016-08-23 DIAGNOSIS — E876 Hypokalemia: Secondary | ICD-10-CM | POA: Diagnosis present

## 2016-08-23 DIAGNOSIS — N179 Acute kidney failure, unspecified: Secondary | ICD-10-CM

## 2016-08-23 DIAGNOSIS — Z888 Allergy status to other drugs, medicaments and biological substances status: Secondary | ICD-10-CM

## 2016-08-23 DIAGNOSIS — E873 Alkalosis: Secondary | ICD-10-CM | POA: Diagnosis present

## 2016-08-23 DIAGNOSIS — E785 Hyperlipidemia, unspecified: Secondary | ICD-10-CM | POA: Diagnosis present

## 2016-08-23 DIAGNOSIS — I4581 Long QT syndrome: Secondary | ICD-10-CM | POA: Diagnosis present

## 2016-08-23 DIAGNOSIS — F1123 Opioid dependence with withdrawal: Secondary | ICD-10-CM | POA: Diagnosis present

## 2016-08-23 DIAGNOSIS — E869 Volume depletion, unspecified: Secondary | ICD-10-CM | POA: Diagnosis present

## 2016-08-23 DIAGNOSIS — R1110 Vomiting, unspecified: Secondary | ICD-10-CM

## 2016-08-23 DIAGNOSIS — E1165 Type 2 diabetes mellitus with hyperglycemia: Secondary | ICD-10-CM | POA: Diagnosis present

## 2016-08-23 DIAGNOSIS — M549 Dorsalgia, unspecified: Secondary | ICD-10-CM | POA: Diagnosis present

## 2016-08-23 DIAGNOSIS — G8929 Other chronic pain: Secondary | ICD-10-CM

## 2016-08-23 LAB — CBG MONITORING, ED: Glucose-Capillary: 461 mg/dL — ABNORMAL HIGH (ref 65–99)

## 2016-08-23 NOTE — ED Triage Notes (Signed)
Pt has been vomiting all day and c/o hyperglycemia. EMS was called for pt by mother and he refused to come to hospital so mother took IVC paperwork out on pt so he could get treatment. Pt denies any si/hi ideation at this time.

## 2016-08-24 ENCOUNTER — Emergency Department (HOSPITAL_COMMUNITY): Payer: Managed Care, Other (non HMO)

## 2016-08-24 ENCOUNTER — Inpatient Hospital Stay (HOSPITAL_COMMUNITY): Payer: Managed Care, Other (non HMO)

## 2016-08-24 DIAGNOSIS — E1010 Type 1 diabetes mellitus with ketoacidosis without coma: Secondary | ICD-10-CM | POA: Diagnosis not present

## 2016-08-24 DIAGNOSIS — Z886 Allergy status to analgesic agent status: Secondary | ICD-10-CM | POA: Diagnosis not present

## 2016-08-24 DIAGNOSIS — N179 Acute kidney failure, unspecified: Secondary | ICD-10-CM

## 2016-08-24 DIAGNOSIS — Z720 Tobacco use: Secondary | ICD-10-CM | POA: Diagnosis not present

## 2016-08-24 DIAGNOSIS — Z9119 Patient's noncompliance with other medical treatment and regimen: Secondary | ICD-10-CM | POA: Diagnosis not present

## 2016-08-24 DIAGNOSIS — E784 Other hyperlipidemia: Secondary | ICD-10-CM | POA: Diagnosis not present

## 2016-08-24 DIAGNOSIS — R109 Unspecified abdominal pain: Secondary | ICD-10-CM

## 2016-08-24 DIAGNOSIS — Z885 Allergy status to narcotic agent status: Secondary | ICD-10-CM | POA: Diagnosis not present

## 2016-08-24 DIAGNOSIS — E1022 Type 1 diabetes mellitus with diabetic chronic kidney disease: Secondary | ICD-10-CM | POA: Diagnosis present

## 2016-08-24 DIAGNOSIS — E860 Dehydration: Secondary | ICD-10-CM

## 2016-08-24 DIAGNOSIS — Z888 Allergy status to other drugs, medicaments and biological substances status: Secondary | ICD-10-CM | POA: Diagnosis not present

## 2016-08-24 DIAGNOSIS — E1043 Type 1 diabetes mellitus with diabetic autonomic (poly)neuropathy: Secondary | ICD-10-CM | POA: Diagnosis present

## 2016-08-24 DIAGNOSIS — G43A0 Cyclical vomiting, not intractable: Secondary | ICD-10-CM | POA: Diagnosis not present

## 2016-08-24 DIAGNOSIS — R112 Nausea with vomiting, unspecified: Secondary | ICD-10-CM | POA: Diagnosis not present

## 2016-08-24 DIAGNOSIS — E870 Hyperosmolality and hypernatremia: Secondary | ICD-10-CM | POA: Diagnosis present

## 2016-08-24 DIAGNOSIS — E1143 Type 2 diabetes mellitus with diabetic autonomic (poly)neuropathy: Secondary | ICD-10-CM

## 2016-08-24 DIAGNOSIS — Z882 Allergy status to sulfonamides status: Secondary | ICD-10-CM | POA: Diagnosis not present

## 2016-08-24 DIAGNOSIS — I10 Essential (primary) hypertension: Secondary | ICD-10-CM | POA: Diagnosis not present

## 2016-08-24 DIAGNOSIS — E878 Other disorders of electrolyte and fluid balance, not elsewhere classified: Secondary | ICD-10-CM | POA: Diagnosis present

## 2016-08-24 DIAGNOSIS — E869 Volume depletion, unspecified: Secondary | ICD-10-CM | POA: Diagnosis present

## 2016-08-24 DIAGNOSIS — E1065 Type 1 diabetes mellitus with hyperglycemia: Secondary | ICD-10-CM | POA: Diagnosis present

## 2016-08-24 DIAGNOSIS — F1193 Opioid use, unspecified with withdrawal: Secondary | ICD-10-CM

## 2016-08-24 DIAGNOSIS — E785 Hyperlipidemia, unspecified: Secondary | ICD-10-CM | POA: Diagnosis present

## 2016-08-24 DIAGNOSIS — K3184 Gastroparesis: Secondary | ICD-10-CM

## 2016-08-24 DIAGNOSIS — Z79899 Other long term (current) drug therapy: Secondary | ICD-10-CM | POA: Diagnosis not present

## 2016-08-24 DIAGNOSIS — G8929 Other chronic pain: Secondary | ICD-10-CM | POA: Diagnosis not present

## 2016-08-24 DIAGNOSIS — F1123 Opioid dependence with withdrawal: Secondary | ICD-10-CM

## 2016-08-24 DIAGNOSIS — I129 Hypertensive chronic kidney disease with stage 1 through stage 4 chronic kidney disease, or unspecified chronic kidney disease: Secondary | ICD-10-CM | POA: Diagnosis present

## 2016-08-24 DIAGNOSIS — E873 Alkalosis: Secondary | ICD-10-CM | POA: Diagnosis present

## 2016-08-24 DIAGNOSIS — N183 Chronic kidney disease, stage 3 (moderate): Secondary | ICD-10-CM | POA: Diagnosis present

## 2016-08-24 DIAGNOSIS — E1100 Type 2 diabetes mellitus with hyperosmolarity without nonketotic hyperglycemic-hyperosmolar coma (NKHHC): Secondary | ICD-10-CM | POA: Diagnosis not present

## 2016-08-24 DIAGNOSIS — G894 Chronic pain syndrome: Secondary | ICD-10-CM | POA: Diagnosis present

## 2016-08-24 DIAGNOSIS — E876 Hypokalemia: Secondary | ICD-10-CM | POA: Diagnosis present

## 2016-08-24 DIAGNOSIS — I4581 Long QT syndrome: Secondary | ICD-10-CM | POA: Diagnosis present

## 2016-08-24 LAB — CBC WITH DIFFERENTIAL/PLATELET
BASOS ABS: 0 10*3/uL (ref 0.0–0.1)
BASOS PCT: 0 %
Eosinophils Absolute: 0 10*3/uL (ref 0.0–0.7)
Eosinophils Relative: 0 %
HEMATOCRIT: 26.9 % — AB (ref 39.0–52.0)
HEMOGLOBIN: 9.3 g/dL — AB (ref 13.0–17.0)
LYMPHS PCT: 8 %
Lymphs Abs: 2.1 10*3/uL (ref 0.7–4.0)
MCH: 25.7 pg — ABNORMAL LOW (ref 26.0–34.0)
MCHC: 34.6 g/dL (ref 30.0–36.0)
MCV: 74.3 fL — ABNORMAL LOW (ref 78.0–100.0)
Monocytes Absolute: 2 10*3/uL — ABNORMAL HIGH (ref 0.1–1.0)
Monocytes Relative: 8 %
NEUTROS ABS: 20.7 10*3/uL — AB (ref 1.7–7.7)
NEUTROS PCT: 84 %
Platelets: 519 10*3/uL — ABNORMAL HIGH (ref 150–400)
RBC: 3.62 MIL/uL — ABNORMAL LOW (ref 4.22–5.81)
RDW: 18.8 % — ABNORMAL HIGH (ref 11.5–15.5)
WBC: 24.8 10*3/uL — ABNORMAL HIGH (ref 4.0–10.5)

## 2016-08-24 LAB — BASIC METABOLIC PANEL
ANION GAP: 16 — AB (ref 5–15)
Anion gap: 17 — ABNORMAL HIGH (ref 5–15)
Anion gap: 20 — ABNORMAL HIGH (ref 5–15)
BUN: 58 mg/dL — AB (ref 6–20)
BUN: 52 mg/dL — AB (ref 6–20)
BUN: 48 mg/dL — AB (ref 6–20)
CALCIUM: 7.2 mg/dL — AB (ref 8.9–10.3)
CALCIUM: 7.2 mg/dL — AB (ref 8.9–10.3)
CHLORIDE: 77 mmol/L — AB (ref 101–111)
CHLORIDE: 74 mmol/L — AB (ref 101–111)
CHLORIDE: 76 mmol/L — AB (ref 101–111)
CO2: 42 mmol/L — AB (ref 22–32)
CO2: 37 mmol/L — AB (ref 22–32)
CO2: 38 mmol/L — AB (ref 22–32)
CREATININE: 4.42 mg/dL — AB (ref 0.61–1.24)
CREATININE: 4.09 mg/dL — AB (ref 0.61–1.24)
Calcium: 6.8 mg/dL — ABNORMAL LOW (ref 8.9–10.3)
Creatinine, Ser: 4.04 mg/dL — ABNORMAL HIGH (ref 0.61–1.24)
GFR calc Af Amer: 19 mL/min — ABNORMAL LOW (ref >60)
GFR calc non Af Amer: 16 mL/min — ABNORMAL LOW (ref >60)
GFR calc non Af Amer: 18 mL/min — ABNORMAL LOW (ref >60)
GFR, EST AFRICAN AMERICAN: 21 mL/min — AB (ref >60)
GFR, EST AFRICAN AMERICAN: 21 mL/min — AB (ref >60)
GFR, EST NON AFRICAN AMERICAN: 18 mL/min — AB (ref >60)
GLUCOSE: 170 mg/dL — AB (ref 65–99)
Glucose, Bld: 188 mg/dL — ABNORMAL HIGH (ref 65–99)
Glucose, Bld: 156 mg/dL — ABNORMAL HIGH (ref 65–99)
Potassium: 2.9 mmol/L — ABNORMAL LOW (ref 3.5–5.1)
Potassium: 2.7 mmol/L — CL (ref 3.5–5.1)
Potassium: 2.7 mmol/L — CL (ref 3.5–5.1)
Sodium: 136 mmol/L (ref 135–145)
Sodium: 131 mmol/L — ABNORMAL LOW (ref 135–145)
Sodium: 130 mmol/L — ABNORMAL LOW (ref 135–145)

## 2016-08-24 LAB — COMPREHENSIVE METABOLIC PANEL
ALT: 13 U/L — AB (ref 17–63)
AST: 17 U/L (ref 15–41)
Albumin: 3.7 g/dL (ref 3.5–5.0)
Alkaline Phosphatase: 117 U/L (ref 38–126)
BUN: 62 mg/dL — ABNORMAL HIGH (ref 6–20)
CALCIUM: 6.9 mg/dL — AB (ref 8.9–10.3)
CO2: 41 mmol/L — AB (ref 22–32)
Chloride: 68 mmol/L — ABNORMAL LOW (ref 101–111)
Creatinine, Ser: 4.64 mg/dL — ABNORMAL HIGH (ref 0.61–1.24)
GFR, EST AFRICAN AMERICAN: 18 mL/min — AB (ref >60)
GFR, EST NON AFRICAN AMERICAN: 15 mL/min — AB (ref >60)
Glucose, Bld: 433 mg/dL — ABNORMAL HIGH (ref 65–99)
POTASSIUM: 3.4 mmol/L — AB (ref 3.5–5.1)
Sodium: 131 mmol/L — ABNORMAL LOW (ref 135–145)
TOTAL PROTEIN: 6.9 g/dL (ref 6.5–8.1)
Total Bilirubin: 1.4 mg/dL — ABNORMAL HIGH (ref 0.3–1.2)

## 2016-08-24 LAB — GLUCOSE, CAPILLARY
GLUCOSE-CAPILLARY: 275 mg/dL — AB (ref 65–99)
GLUCOSE-CAPILLARY: 208 mg/dL — AB (ref 65–99)
GLUCOSE-CAPILLARY: 189 mg/dL — AB (ref 65–99)
GLUCOSE-CAPILLARY: 152 mg/dL — AB (ref 65–99)
GLUCOSE-CAPILLARY: 187 mg/dL — AB (ref 65–99)
GLUCOSE-CAPILLARY: 138 mg/dL — AB (ref 65–99)
Glucose-Capillary: 218 mg/dL — ABNORMAL HIGH (ref 65–99)
Glucose-Capillary: 134 mg/dL — ABNORMAL HIGH (ref 65–99)
Glucose-Capillary: 104 mg/dL — ABNORMAL HIGH (ref 65–99)
Glucose-Capillary: 133 mg/dL — ABNORMAL HIGH (ref 65–99)
Glucose-Capillary: 170 mg/dL — ABNORMAL HIGH (ref 65–99)
Glucose-Capillary: 151 mg/dL — ABNORMAL HIGH (ref 65–99)
Glucose-Capillary: 176 mg/dL — ABNORMAL HIGH (ref 65–99)
Glucose-Capillary: 150 mg/dL — ABNORMAL HIGH (ref 65–99)
Glucose-Capillary: 137 mg/dL — ABNORMAL HIGH (ref 65–99)
Glucose-Capillary: 173 mg/dL — ABNORMAL HIGH (ref 65–99)
Glucose-Capillary: 186 mg/dL — ABNORMAL HIGH (ref 65–99)

## 2016-08-24 LAB — URINALYSIS, ROUTINE W REFLEX MICROSCOPIC
Ketones, ur: 15 mg/dL — AB
Leukocytes, UA: NEGATIVE
Nitrite: NEGATIVE
PH: 6 (ref 5.0–8.0)
Protein, ur: 100 mg/dL — AB
SPECIFIC GRAVITY, URINE: 1.015 (ref 1.005–1.030)

## 2016-08-24 LAB — RAPID URINE DRUG SCREEN, HOSP PERFORMED
AMPHETAMINES: NOT DETECTED
BENZODIAZEPINES: NOT DETECTED
Barbiturates: NOT DETECTED
COCAINE: NOT DETECTED
OPIATES: NOT DETECTED
Tetrahydrocannabinol: NOT DETECTED

## 2016-08-24 LAB — MRSA PCR SCREENING: MRSA by PCR: NEGATIVE

## 2016-08-24 LAB — URINALYSIS, MICROSCOPIC (REFLEX)
Squamous Epithelial / LPF: NONE SEEN
WBC, UA: NONE SEEN WBC/hpf (ref 0–5)

## 2016-08-24 LAB — MAGNESIUM
MAGNESIUM: 2 mg/dL (ref 1.7–2.4)
Magnesium: 1.8 mg/dL (ref 1.7–2.4)

## 2016-08-24 LAB — CBG MONITORING, ED
GLUCOSE-CAPILLARY: 346 mg/dL — AB (ref 65–99)
Glucose-Capillary: 423 mg/dL — ABNORMAL HIGH (ref 65–99)

## 2016-08-24 LAB — LIPASE, BLOOD: Lipase: 18 U/L (ref 11–51)

## 2016-08-24 LAB — ETHANOL

## 2016-08-24 LAB — PHOSPHORUS: PHOSPHORUS: 8 mg/dL — AB (ref 2.5–4.6)

## 2016-08-24 MED ORDER — AMLODIPINE BESYLATE 5 MG PO TABS
10.0000 mg | ORAL_TABLET | Freq: Every day | ORAL | Status: DC
  Administered 2016-08-24 – 2016-08-27 (×4): 10 mg via ORAL
  Filled 2016-08-24 (×4): qty 2

## 2016-08-24 MED ORDER — MAGNESIUM SULFATE IN D5W 1-5 GM/100ML-% IV SOLN
1.0000 g | Freq: Once | INTRAVENOUS | Status: AC
  Administered 2016-08-24: 1 g via INTRAVENOUS
  Filled 2016-08-24: qty 100

## 2016-08-24 MED ORDER — ONDANSETRON HCL 4 MG/2ML IJ SOLN
4.0000 mg | INTRAMUSCULAR | Status: DC | PRN
  Administered 2016-08-24 – 2016-08-27 (×15): 4 mg via INTRAVENOUS
  Filled 2016-08-24 (×15): qty 2

## 2016-08-24 MED ORDER — SODIUM CHLORIDE 0.9 % IV SOLN
INTRAVENOUS | Status: DC
  Administered 2016-08-24: 3.6 [IU]/h via INTRAVENOUS
  Filled 2016-08-24 (×2): qty 1

## 2016-08-24 MED ORDER — POTASSIUM CHLORIDE 10 MEQ/100ML IV SOLN
10.0000 meq | INTRAVENOUS | Status: AC
  Administered 2016-08-24 (×3): 10 meq via INTRAVENOUS
  Filled 2016-08-24 (×3): qty 100

## 2016-08-24 MED ORDER — ONDANSETRON HCL 4 MG/2ML IJ SOLN
4.0000 mg | Freq: Four times a day (QID) | INTRAMUSCULAR | Status: DC | PRN
  Administered 2016-08-24 (×3): 4 mg via INTRAVENOUS
  Filled 2016-08-24 (×3): qty 2

## 2016-08-24 MED ORDER — SODIUM CHLORIDE 0.9 % IV SOLN
INTRAVENOUS | Status: DC
  Administered 2016-08-25: 03:00:00 via INTRAVENOUS
  Filled 2016-08-24 (×2): qty 1

## 2016-08-24 MED ORDER — SODIUM CHLORIDE 0.9 % IV BOLUS (SEPSIS)
1000.0000 mL | Freq: Once | INTRAVENOUS | Status: AC
  Administered 2016-08-24: 1000 mL via INTRAVENOUS

## 2016-08-24 MED ORDER — PRAVASTATIN SODIUM 10 MG PO TABS
20.0000 mg | ORAL_TABLET | Freq: Every day | ORAL | Status: DC
  Administered 2016-08-24 – 2016-08-27 (×4): 20 mg via ORAL
  Filled 2016-08-24 (×4): qty 2

## 2016-08-24 MED ORDER — HEPARIN SODIUM (PORCINE) 5000 UNIT/ML IJ SOLN
5000.0000 [IU] | Freq: Three times a day (TID) | INTRAMUSCULAR | Status: DC
  Administered 2016-08-24 – 2016-08-27 (×10): 5000 [IU] via SUBCUTANEOUS
  Filled 2016-08-24 (×10): qty 1

## 2016-08-24 MED ORDER — GI COCKTAIL ~~LOC~~
30.0000 mL | Freq: Three times a day (TID) | ORAL | Status: DC | PRN
  Administered 2016-08-24 – 2016-08-26 (×7): 30 mL via ORAL
  Filled 2016-08-24 (×7): qty 30

## 2016-08-24 MED ORDER — METOCLOPRAMIDE HCL 5 MG/ML IJ SOLN
10.0000 mg | Freq: Once | INTRAMUSCULAR | Status: AC
  Administered 2016-08-24: 10 mg via INTRAVENOUS

## 2016-08-24 MED ORDER — POTASSIUM CHLORIDE 10 MEQ/100ML IV SOLN
10.0000 meq | INTRAVENOUS | Status: AC
  Administered 2016-08-24 (×2): 10 meq via INTRAVENOUS
  Filled 2016-08-24 (×2): qty 100

## 2016-08-24 MED ORDER — ONDANSETRON HCL 4 MG/2ML IJ SOLN
4.0000 mg | Freq: Once | INTRAMUSCULAR | Status: DC
  Filled 2016-08-24: qty 2

## 2016-08-24 MED ORDER — SODIUM CHLORIDE 0.9 % IV SOLN
INTRAVENOUS | Status: AC
  Administered 2016-08-24: 05:00:00 via INTRAVENOUS

## 2016-08-24 MED ORDER — SODIUM CHLORIDE 0.9 % IV SOLN
INTRAVENOUS | Status: AC
  Filled 2016-08-24: qty 1

## 2016-08-24 MED ORDER — POTASSIUM CHLORIDE CRYS ER 20 MEQ PO TBCR
40.0000 meq | EXTENDED_RELEASE_TABLET | Freq: Four times a day (QID) | ORAL | Status: DC
  Administered 2016-08-24: 40 meq via ORAL
  Filled 2016-08-24 (×2): qty 2

## 2016-08-24 MED ORDER — FENTANYL CITRATE (PF) 100 MCG/2ML IJ SOLN
50.0000 ug | Freq: Once | INTRAMUSCULAR | Status: AC
  Administered 2016-08-24: 50 ug via INTRAVENOUS

## 2016-08-24 MED ORDER — DEXTROSE-NACL 5-0.45 % IV SOLN
INTRAVENOUS | Status: DC
  Administered 2016-08-24 – 2016-08-26 (×6): via INTRAVENOUS
  Administered 2016-08-26: 1 mL via INTRAVENOUS

## 2016-08-24 MED ORDER — SODIUM CHLORIDE 0.9 % IV SOLN
INTRAVENOUS | Status: AC
  Administered 2016-08-24 (×2): via INTRAVENOUS

## 2016-08-24 MED ORDER — HYDROMORPHONE HCL 1 MG/ML IJ SOLN
1.0000 mg | INTRAMUSCULAR | Status: DC | PRN
  Administered 2016-08-24 – 2016-08-27 (×20): 1 mg via INTRAVENOUS
  Filled 2016-08-24 (×20): qty 1

## 2016-08-24 MED ORDER — POTASSIUM CHLORIDE 10 MEQ/100ML IV SOLN
10.0000 meq | INTRAVENOUS | Status: AC
  Administered 2016-08-24 (×4): 10 meq via INTRAVENOUS
  Filled 2016-08-24 (×5): qty 100

## 2016-08-24 MED ORDER — METOCLOPRAMIDE HCL 5 MG/ML IJ SOLN
INTRAMUSCULAR | Status: AC
  Filled 2016-08-24: qty 2

## 2016-08-24 MED ORDER — FENTANYL CITRATE (PF) 100 MCG/2ML IJ SOLN
INTRAMUSCULAR | Status: AC
  Filled 2016-08-24: qty 2

## 2016-08-24 MED ORDER — ASPIRIN EC 81 MG PO TBEC
81.0000 mg | DELAYED_RELEASE_TABLET | Freq: Every day | ORAL | Status: DC
  Administered 2016-08-24 – 2016-08-27 (×4): 81 mg via ORAL
  Filled 2016-08-24 (×4): qty 1

## 2016-08-24 MED ORDER — GI COCKTAIL ~~LOC~~
30.0000 mL | Freq: Once | ORAL | Status: AC
  Administered 2016-08-24: 30 mL via ORAL
  Filled 2016-08-24: qty 30

## 2016-08-24 NOTE — ED Notes (Signed)
Checked in on pt for a urine sample,pt stated doesn't have to go right now.

## 2016-08-24 NOTE — Progress Notes (Signed)
PROGRESS NOTE  Brent Daniels HFW:[id number] DOB: May 16, 1985 DOA: 08/23/2016 PCP: [name], MD  Brief History:  31 y/o male with history of DM type 1, HTN, HLD, chronic pain syndrome with opioid dependence presented with 2 day hx of N/V and abdominal pain.  Apparently, the patient was [facility] by his mother, but this was rescinded by Dr. [name] in ED as the patient was appropriate without SI/HI.  The patient states that he has been faithful with his insulin, but he endorses dietary indiscretions. He denies any fevers, chills, chest discomfort, short of breath, hematemesis, medication, melena. He states that he ran out of his Percocet approximately one week prior to admission. [facility]. He denies any illegal drug use. Upon presentation, the patient was noted to have a serum glucose of 433 with anion gap of 22.  In addition, the patient was noted to have a serum glucose 433 and serum creatinine of 4.64. He was started on IV fluids and intravenous insulin. Notably, the patient has had numerous hospitalizations in the past 2 months for intractable nausea and vomiting. The patient has been admitted to [facility] on 06/13/2016, 07/30/2016, 08/11/2016. Assessment/Plan: Hyperosmolar state -Patient presented with serum glucose of 433 with anion gap of 22 with bicarbonate of 41. -Continue IV fluids -Continue IV insulin until anion gap closes -BMP every 4 hours  Diabetes mellitus type 1, uncontrolled with hyperglycemia -Home regimen of Lantus 20 units daily - Transition to subcutaneous insulin once anion gap closes  -Patient endorses dietary indiscretion   AKI -Patient apparently was discharged from [facility] on 08/11/16 with serum creatinine 2.7 -renal US -holding lisinopril  Diabetic gastroparesis/intractable nausea and vomiting -Multifactorial including hyperosmolar state, diabetic gastroparesis, opioids, and possible  opioid withdrawal -12/23/2013 EGD--severe exudative esophagitis cannot rule out superimposed Candida infection -Start PPI  Chronic abdominal pain -lipase 18 -pain is out of proportion with objective exam findings  Essential hypertension -Continue amlodipine  Hyperlipidemia -continue pravastatin    Disposition Plan:   Home in 2-3 days  Family Communication: No  Family at bedside--Total time spent 35 minutes.  Greater than 50% spent face to face counseling and coordinating care. [phone] to [phone]   Consultants:  none  Code Status:  FULL   DVT Prophylaxis:  [place] Heparin   Procedures: As Listed in Progress Note Above  Antibiotics: None    Subjective:  patient continues to have nausea with hypersalivation. He states that his emesis is somewhat better. He states that his abdominal pain is hurting like crazy. Denies any headaches, fevers, chest pain, shortness of breath, hematochezia, melena, hematemesis, dysuria, hematuria.   Objective: Vitals:   08/24/16 0255 08/24/16 0300 08/24/16 0412 08/24/16 0743  BP: (!) 143/93 131/87    Pulse: (!) 106 (!) 101    Resp: 12     Temp:   98.7 F (37.1 C) 98.1 F (36.7 C)  TempSrc:   Oral Oral  SpO2: 95% (!) 86%    Weight:   84.7 kg (186 lb 11.7 oz)   Height:   6\' 2"  (1.88 m)     Intake/Output Summary (Last 24 hours) at 08/24/16 0827 Last data filed at 08/24/16 0300  Gross per 24 hour  Intake             2000 ml  Output              600 ml  Net  1400 ml   Weight change:  Exam:   General:  Pt is alert, follows commands appropriately, not in acute distress  HEENT: No icterus, No thrush, No neck mass, [place]/AT  Cardiovascular: RRR, S1/S2, no rubs, no gallops  Respiratory: CTA bilaterally, no wheezing, no crackles, no rhonchi  Abdomen: Soft/+BS, diffusely tender, non distended, no guarding  Extremities: No edema, No lymphangitis, No petechiae, No rashes, no synovitis   Data Reviewed: I have personally reviewed  following labs and imaging studies Basic Metabolic Panel:  Recent Labs Lab 08/24/16 0050 08/24/16 0723  NA 131* 136  K 3.4* 2.9*  CL 68* 77*  CO2 41* 42*  GLUCOSE 433* 188*  BUN 62* 58*  CREATININE 4.64* 4.42*  CALCIUM 6.9* 6.8*  MG 1.8  --   PHOS 8.0*  --    Liver Function Tests:  Recent Labs Lab 08/24/16 0050  AST 17  ALT 13*  ALKPHOS 117  BILITOT 1.4*  PROT 6.9  ALBUMIN 3.7    Recent Labs Lab 08/24/16 0050  LIPASE 18   No results for input(s): AMMONIA in the last 168 hours. Coagulation Profile: No results for input(s): INR, PROTIME in the last 168 hours. CBC:  Recent Labs Lab 08/24/16 0050  WBC 24.8*  NEUTROABS 20.7*  HGB 9.3*  HCT 26.9*  MCV 74.3*  PLT 519*   Cardiac Enzymes: No results for input(s): CKTOTAL, CKMB, CKMBINDEX, TROPONINI in the last 168 hours. BNP: Invalid input(s): POCBNP CBG:  Recent Labs Lab 08/24/16 0203 08/24/16 0254 08/24/16 0402 08/24/16 0510 08/24/16 0601  GLUCAP 423* 346* 275* 218* 208*   HbA1C: No results for input(s): HGBA1C in the last 72 hours. Urine analysis:    Component Value Date/Time   COLORURINE YELLOW 08/24/2016 0402   APPEARANCEUR CLEAR 08/24/2016 0402   LABSPEC 1.015 08/24/2016 0402   PHURINE 6.0 08/24/2016 0402   GLUCOSEU >=500 (A) 08/24/2016 0402   HGBUR TRACE (A) 08/24/2016 0402   BILIRUBINUR SMALL (A) 08/24/2016 0402   KETONESUR 15 (A) 08/24/2016 0402   PROTEINUR 100 (A) 08/24/2016 0402   UROBILINOGEN 1.0 11/13/2014 0155   NITRITE NEGATIVE 08/24/2016 0402   LEUKOCYTESUR NEGATIVE 08/24/2016 0402   Sepsis Labs: @LABRCNTIP (procalcitonin:4,lacticidven:4) ) Recent Results (from the past 240 hour(s))  MRSA PCR Screening     Status: None   Collection Time: 08/24/16  4:00 AM  Result Value Ref Range Status   MRSA by PCR NEGATIVE NEGATIVE Final    Comment:        The GeneXpert MRSA Assay (FDA approved for NASAL specimens only), is one component of a comprehensive MRSA  colonization surveillance program. It is not intended to diagnose MRSA infection nor to guide or monitor treatment for MRSA infections.      Scheduled Meds: . amLODipine  10 mg Oral Daily  . aspirin EC  81 mg Oral Daily  . heparin  5,000 Units Subcutaneous Q8H  . pravastatin  20 mg Oral Daily   Continuous Infusions: . sodium chloride Stopped (08/24/16 0513)  . dextrose 5 % and 0.45% NaCl 125 mL/hr at 08/24/16 0520  . insulin (NOVOLIN-R) infusion    . potassium chloride      Procedures/Studies: Dg Chest Port 1 View  Result Date: 08/24/2016 CLINICAL DATA:  31 year old male with vomiting. EXAM: PORTABLE CHEST 1 VIEW COMPARISON:  Chest radiograph dated 08/10/2016 FINDINGS: The heart size and mediastinal contours are within normal limits. Both lungs are clear. The visualized skeletal structures are unremarkable. IMPRESSION: No active disease. Electronically Signed  By: Anner Crete M.D.   On: 08/24/2016 01:56    Quinetta Shilling, DO  [facility] Pager [phone]  If 7PM-7AM, please contact night-coverage [email or web address] Password [email or web address] 08/24/2016, 8:27 AM   LOS: 0 days

## 2016-08-24 NOTE — ED Notes (Signed)
Gave Dr.Wickline EKG.

## 2016-08-24 NOTE — Progress Notes (Signed)
Inpatient Diabetes Program Recommendations  AACE/ADA: New Consensus Statement on Inpatient Glycemic Control (2015)  Target Ranges:  Prepandial:   less than 140 mg/dL      Peak postprandial:   less than 180 mg/dL (1-2 hours)      Critically ill patients:  140 - 180 mg/dL   Results for Brent Daniels, Brent Daniels (MRN [id number]) as of 08/24/2016 10:31  Ref. Range 08/24/2016 00:00 08/24/2016 02:03 08/24/2016 02:54 08/24/2016 04:02 08/24/2016 05:10 08/24/2016 06:01 08/24/2016 07:10 08/24/2016 08:01 08/24/2016 09:00  Glucose-Capillary Latest Ref Range: 65 - 99 mg/dL 461 (H) 423 (H) 346 (H) 275 (H) 218 (H) 208 (H) 189 (H) 152 (H) 134 (H)   Review of Glycemic Control  Diabetes history: DM1 Outpatient Diabetes medications:  Lantus 20 units QHS, Regular for correction and meal coverage Current orders for Inpatient glycemic control: IV insulin drip  Inpatient Diabetes Program Recommendations: Insulin - Basal: When MD is ready to transition from IV to SQ insulin, please consider ordering Lantus 20 units Q24H. Correction (SSI): When MD is ready to transition from IV to SQ insulin, please consider ordering CBGs with Novolog 0-9 units Q4H. Insulin - Meal Coverage: When MD is ready to transition from IV to SQ insulin and diet is ordered, please consider ordering Novolog 3 units TID with meals for meal coverage if patient eats at least 50% of meals.  NOTE: Per chart review, patient had 70/30, Basaglar, and Regular insulin on home medication list but H&P reports patient taking Lantus and Humalog for DM control. Spoke with patient over the phone (Diabetes Coordinator not on [facility] today) and patient reports that he is taking Lantus 20 units QHS and Regular for meal coverage and correction. Patient states that over the past couple of weeks his glucose has been running high in the 200's mg/dl most of the time. Patient denies any issues with glucose levels in the 400's recently as noted at time of admission. Patient has DM1 so he  makes no insulin and will require basal, carb coverage, and correction insulin for DM control when transitioned off IV insulin drip.  Thanks, Barnie Alderman, RN, MSN, CDE Diabetes Coordinator Inpatient Diabetes Program [phone] (Team Pager from 8am to 5pm)

## 2016-08-24 NOTE — ED Provider Notes (Signed)
[facility] DEPT Provider Note   CSN: [id number] Arrival date & time: 08/23/16  2350     History   Chief Complaint Chief Complaint  Patient presents with  . Hyperglycemia  . V70.1    HPI Brent Daniels is a 31 y.o. male.  The history is provided by the patient.  Hyperglycemia  Severity:  Severe Onset quality:  Gradual Duration:  1 day Timing:  Constant Progression:  Worsening Chronicity:  Recurrent Diabetes status:  Controlled with insulin Relieved by:  Nothing Ineffective treatments:  None tried Associated symptoms: abdominal pain and vomiting   Associated symptoms: no chest pain and no fever    Patient with known h/o diabetes and chronic pain presents with nausea/vomiting, abdominal pain and hyperglycemia This has been worsening over past day despite taking insulin No fever He also reports chronic back pain He reports recent admission to [facility]   Apparently he refused to go to hospital and family enacted IVC paperwork Pt denies SI/HI at this time  Past Medical History:  Diagnosis Date  . Acute esophagitis   . Anxiety   . Arthritis   . Chronic abdominal pain   . Chronic back pain   . Diabetes mellitus    type 1  . Diabetic gastroparesis associated with type 1 diabetes mellitus ([place])   . Erosive esophagitis 12/23/2013   "severe" per EGD   . Gastroparesis   . GERD (gastroesophageal reflux disease)   . Heart murmur    valvular stenosis  . Hypercholesteremia   . Hypertension   . Insomnia   . Nausea and vomiting    chronic, recurrent  . Neuropathy   . PUD (peptic ulcer disease)   . S/P arthroscopic knee surgery 07/04/2011  . Scoliosis 07/11/2012    Patient Active Problem List   Diagnosis Date Noted  . Reflux esophagitis 05/14/2015  . Nausea & vomiting 11/13/2014  . AKI (acute kidney injury) ([place]) 11/13/2014  . Hyperglycemia due to type 1 diabetes mellitus ([place]) 04/24/2014  . RUQ abdominal pain 04/14/2014  . DKA (diabetic ketoacidoses)  ([place]) 04/03/2014  . Gastroparesis 03/30/2014  . Drug abuse 03/30/2014  . Tobacco use 03/30/2014  . HLD (hyperlipidemia) 03/30/2014  . Oropharyngeal candidiasis   . Metabolic acidosis   . Abdominal fluid collection   . Abdominal pain, acute   . Esophagitis 01/03/2014  . Nausea with vomiting   . Acute esophagitis   . Hyperglycemia 12/20/2013  . Abdominal pain 12/09/2013  . PUD (peptic ulcer disease) 10/29/2013  . Leukocytosis 09/29/2013  . Sinus tachycardia 09/29/2013  . Intractable nausea and vomiting 09/28/2013  . Chronic generalized abdominal pain 09/28/2013  . Diabetic gastroparesis associated with type 1 diabetes mellitus ([place]) 09/28/2013  . Hematemesis 09/28/2013  . Diabetic neuropathy ([place]) 09/28/2013  . Abdominal pain, epigastric 08/19/2013  . Uncontrolled diabetes mellitus ([place]) 06/15/2013  . GERD (gastroesophageal reflux disease) 06/15/2013  . DKA, type 1 ([place]) 06/15/2013  . Essential hypertension 07/11/2012  . DM (diabetes mellitus) ([place]) 07/11/2012  . Scoliosis 07/11/2012  . Back pain 07/11/2012  . S/P arthroscopic knee surgery 07/04/2011    Past Surgical History:  Procedure Laterality Date  . BIOPSY  06/17/2013   Procedure: GASTRIC ULCER AND ANTRAL BIOPSIES; ESOPHAGEAL BRUSHING;  Surgeon: Danie Binder, MD;  Location: [facility];  Service: Endoscopy;;  . BIOPSY N/A 12/23/2013   Procedure: ESOPHAGEAL BIOPSY;  Surgeon: Daneil Dolin, MD;  Location: [facility];  Service: Endoscopy;  Laterality: N/A;  . CHOLECYSTECTOMY N/A 01/28/2014   Procedure:  LAPAROSCOPIC CHOLECYSTECTOMY;  Surgeon: Jamesetta So, MD;  Location: [facility];  Service: General;  Laterality: N/A;  . ESOPHAGOGASTRODUODENOSCOPY (EGD) WITH PROPOFOL  06/17/2013   Dr. Oneida Alar: two gastric ulcers in fundus, mild antral gastritis, candida esophagitis  . ESOPHAGOGASTRODUODENOSCOPY (EGD) WITH PROPOFOL N/A 09/11/2013   Dr. [name]: abnormal hypopharynx, question massively enlarged tonsils, stomach full of food precluded  exam, needs EGD for verification of ulcer healing at a later date  . ESOPHAGOGASTRODUODENOSCOPY (EGD) WITH PROPOFOL N/A 12/23/2013   RMR: Severe exudative esophagitis likely predominatly reflux related. Superimposed Candida infection not excluded status post KOH brushing and biopsy. Localized excoriating gastric mucosa most consistant with trauma(vomiting). No evidence of peptic ulcer disease or other gastric/duodenal pathology. I suspect severe inflammation involving the distal esophagus may account  for at least some of patii  . KNEE ARTHROSCOPY  06/30/2011   Procedure: ARTHROSCOPY KNEE;  Surgeon: Carole Civil, MD;  Location: [facility];  Service: Orthopedics;  Laterality: Right;  diagnostic arthroscopy  . TYMPANOSTOMY TUBE PLACEMENT         Home Medications    Prior to Admission medications   Medication Sig Start Date End Date Taking? Authorizing Provider  amLODipine (NORVASC) 10 MG tablet Take 10 mg by mouth daily.  05/08/15   [provider]  aspirin 81 MG tablet Take 81 mg by mouth daily.    [provider]  gabapentin (NEURONTIN) 800 MG tablet Take 800 mg by mouth 3 (three) times daily.    [provider]  insulin NPH-regular Human (NOVOLIN 70/30) (70-30) 100 UNIT/ML injection Inject 20 Units into the skin 2 (two) times daily with a meal. 15 units in the morning and 25 at night. Patient taking differently: Inject 15-25 Units into the skin 2 (two) times daily with a meal. 15 units in the morning and 25 at night. 11/17/13   Kathie Dike, MD  insulin regular (NOVOLIN R,HUMULIN R) 100 units/mL injection Inject 2-10 Units into the skin 3 (three) times daily before meals. Sliding scale.    [provider]  lisinopril (PRINIVIL,ZESTRIL) 20 MG tablet Take 20 mg by mouth daily.    [provider]  metoCLOPramide (REGLAN) 10 MG tablet 10 mg 4 (four) times daily -  before meals and at bedtime. 04/26/15   [provider]  ondansetron (ZOFRAN  ODT) 4 MG disintegrating tablet Take 1 tablet (4 mg total) by mouth every 4 (four) hours as needed for nausea or vomiting. 06/09/15   Mahala Menghini, PA-C  oxyCODONE-acetaminophen (PERCOCET) 10-325 MG tablet Take by mouth. 05/20/15   [provider]  pantoprazole (PROTONIX) 40 MG tablet Take 1 tablet (40 mg total) by mouth 2 (two) times daily before a meal. 04/10/14   Annitta Needs, NP  pravastatin (PRAVACHOL) 20 MG tablet Take 20 mg by mouth daily.  09/10/13 05/24/15  [provider]  tiZANidine (ZANAFLEX) 4 MG tablet 4 mg every 8 (eight) hours as needed. 05/11/15   [provider]    Family History Family History  Problem Relation Age of Onset  . Cancer Father   . Alcohol abuse Father   . Pseudochol deficiency Neg Hx   . Malignant hyperthermia Neg Hx   . Hypotension Neg Hx   . Anesthesia problems Neg Hx   . Colon cancer Neg Hx     Social History Social History  Substance Use Topics  . Smoking status: Current Some Day Smoker    Packs/day: 0.25    Years: 10.00  Types: Cigarettes  . Smokeless tobacco: Former Systems developer    Quit date: 02/17/2013     Comment: smokes 4 cigarettes a day   . Alcohol use No     Allergies   Hydrocodone; Tramadol; Ibuprofen; Naproxen; Sulfa antibiotics; and Morphine and related   Review of Systems Review of Systems  Constitutional: Negative for fever.  Cardiovascular: Negative for chest pain.  Gastrointestinal: Positive for abdominal pain and vomiting.  Musculoskeletal: Positive for back pain.  Psychiatric/Behavioral: Negative for suicidal ideas.  All other systems reviewed and are negative.    Physical Exam Updated Vital Signs BP (!) 106/3 (BP Location: Left Arm)   Pulse 78   Temp 97.9 F (36.6 C) (Oral)   Resp 14   Wt 83.9 kg (185 lb)   SpO2 91%   BMI 24.41 kg/m   Physical Exam CONSTITUTIONAL: Chronically ill appearing HEAD: Normocephalic/atraumatic EYES: EOMI/PERRL ENMT: Mucous membranes dry NECK: supple no  meningeal signs SPINE/BACK:entire spine nontender CV: S1/S2 noted, no murmurs/rubs/gallops noted LUNGS: Lungs are clear to auscultation bilaterally, no apparent distress ABDOMEN: soft, mild diffuse tenderness, no rebound or guarding, bowel sounds noted throughout abdomen GU:no cva tenderness NEURO: Pt is awake/alert/appropriate, moves all extremitiesx4.  No facial droop.   EXTREMITIES: pulses normal/equal, full ROM SKIN: warm, color normal, no wounds/lesions to upper/lower extremities PSYCH: no abnormalities of mood noted, alert and oriented to situation   ED Treatments / Results  Labs (all labs ordered are listed, but only abnormal results are displayed) Labs Reviewed  COMPREHENSIVE METABOLIC PANEL - Abnormal; Notable for the following:       Result Value   Sodium 131 (*)    Potassium 3.4 (*)    Chloride 68 (*)    CO2 41 (*)    Glucose, Bld 433 (*)    BUN 62 (*)    Creatinine, Ser 4.64 (*)    Calcium 6.9 (*)    ALT 13 (*)    Total Bilirubin 1.4 (*)    GFR calc non Af Amer 15 (*)    GFR calc Af Amer 18 (*)    All other components within normal limits  CBC WITH DIFFERENTIAL/PLATELET - Abnormal; Notable for the following:    WBC 24.8 (*)    RBC 3.62 (*)    Hemoglobin 9.3 (*)    HCT 26.9 (*)    MCV 74.3 (*)    MCH 25.7 (*)    RDW 18.8 (*)    Platelets 519 (*)    Neutro Abs 20.7 (*)    Monocytes Absolute 2.0 (*)    All other components within normal limits  CBG MONITORING, ED - Abnormal; Notable for the following:    Glucose-Capillary 461 (*)    All other components within normal limits  CBG MONITORING, ED - Abnormal; Notable for the following:    Glucose-Capillary 423 (*)    All other components within normal limits  LIPASE, BLOOD  ETHANOL  RAPID URINE DRUG SCREEN, HOSP PERFORMED  URINALYSIS, ROUTINE W REFLEX MICROSCOPIC    EKG  EKG Interpretation  Date/Time:  Thursday August 24 2016 00:16:23 EDT Ventricular Rate:  88 PR Interval:    QRS Duration: 95 QT  Interval:  463 QTC Calculation: 561 R Axis:   62 Text Interpretation:  Sinus rhythm Biatrial enlargement RSR' in V1 or V2, right VCD or RVH Nonspecific T abnrm, anterolateral leads Prolonged QT interval Abnormal ekg No significant change since last tracing Confirmed by Ripley Fraise [phone]) on 08/24/2016 12:23:53 AM  Radiology Dg Chest Port 1 View  Result Date: 08/24/2016 CLINICAL DATA:  31 year old male with vomiting. EXAM: PORTABLE CHEST 1 VIEW COMPARISON:  Chest radiograph dated 08/10/2016 FINDINGS: The heart size and mediastinal contours are within normal limits. Both lungs are clear. The visualized skeletal structures are unremarkable. IMPRESSION: No active disease. Electronically Signed   By: Anner Crete M.D.   On: 08/24/2016 01:56    Procedures Procedures  CRITICAL CARE Performed by: Sharyon Cable Total critical care time: 33 minutes Critical care time was exclusive of separately billable procedures and treating other patients. Critical care was necessary to treat or prevent imminent or life-threatening deterioration. Critical care was time spent personally by me on the following activities: development of treatment plan with patient and/or surrogate as well as nursing, discussions with consultants, evaluation of patient's response to treatment, examination of patient, obtaining history from patient or surrogate, ordering and performing treatments and interventions, ordering and review of laboratory studies, ordering and review of radiographic studies, pulse oximetry and re-evaluation of patient's condition.  PATIENT WITH ACUTE RENAL FAILURE, DEHYDRATION REQUIRING IV FLUIDS AND ALSO DKA REQUIRING INSULIN DRIP AND ADMISSION  Medications Ordered in ED Medications  insulin regular (NOVOLIN R,HUMULIN R) 100 Units in sodium chloride 0.9 % 100 mL (1 Units/mL) infusion (3.6 Units/hr Intravenous New Bag/Given 08/24/16 0204)  fentaNYL (SUBLIMAZE) 100 MCG/2ML injection (not  administered)  sodium chloride 0.9 % bolus 1,000 mL (0 mLs Intravenous Stopped 08/24/16 0134)  sodium chloride 0.9 % bolus 1,000 mL (0 mLs Intravenous Stopped 08/24/16 0205)  metoCLOPramide (REGLAN) injection 10 mg (10 mg Intravenous Given 08/24/16 0118)  gi cocktail (Maalox,Lidocaine,Donnatal) (30 mLs Oral Given 08/24/16 0125)  fentaNYL (SUBLIMAZE) injection 50 mcg (50 mcg Intravenous Given 08/24/16 0210)     Initial Impression / Assessment and Plan / ED Course  I have reviewed the triage vital signs and the nursing notes.  Pertinent labs esults that were available during my care of the patient were reviewed by me and considered in my medical decision making (see chart for details).     12:26 AM Pt here with vomiting/hyperglycemia IVC rescinded as no SI/HI and pt has appropriate mental status He agrees to stay in ER for treatment 2:08 AM PT WITH SEVERE DEHYDRATION/RENAL FAILURE WILL NEED ADMISSION AND IV FLUIDS/INSULIN DRIP PT REPORTS BACK/ABDOMINAL PAIN SIMILAR TO PRIOR WILL ADMIT PER [facility] RECORDS, EARLIER THIS MONTH CREATININE WAS 2.76 2:11 AM D/W DR Collier Salina LE FOR ADMISSION PT STABILIZED IN THE ER  Final Clinical Impressions(s) / ED Diagnoses   Final diagnoses:  AKI (acute kidney injury) ([place])  Dehydration  Diabetic ketoacidosis without coma associated with type 1 diabetes mellitus ([place])    New Prescriptions New Prescriptions   No medications on file     Ripley Fraise, MD 08/24/16 [phone]

## 2016-08-24 NOTE — H&P (Addendum)
History and Physical    Brent Daniels TTS:[id number] DOB: [birth year] DOA: 08/23/2016  PCP: [name], MD  Patient coming from: Home.    Chief Complaint: nausea and vomiting.   HPI: Brent Daniels is an 31 y.o. male with hx of type I DM with gastroparesis, hx of chronic pain on chronic high dose narcotics,  HTN, HLD, neuropathy, chronic abdominal pain, presented to the ER with IVC from his mother for refusing to go to the ER.  His IVC was later rescinded by EDP.  He has ran out of his narcotic about a week ago.  For his DM, he uses 20 units of Lantus, along with humalog insulin for meal coverages with Insulin sensitivity factor of 30.  Evaluation in the ER showed that he has severe hypochloremic metabolic alkalosis with Cl of 68 and bicarb of 41, undoutedly from contraction alkalosis.  His AG was about 22.  His BS was 400's, and Na 134.  He was also found to be in AKI with Cr of 4.6, with baseline Cr of about 2.  He was discharged from [facility] on July 6 with Cr of 2.7.  Hospitalist was asked to admit him for persistent nausea vomiting, possible DKA.   ED Course:  See above.  Rewiew of Systems:  Constitutional: Negative for malaise, fever and chills. No significant weight loss or weight gain Eyes: Negative for eye pain, redness and discharge, diplopia, visual changes, or flashes of light. ENMT: Negative for ear pain, hoarseness, nasal congestion, sinus pressure and sore throat. No headaches; tinnitus, drooling, or problem swallowing. Cardiovascular: Negative for chest pain, palpitations, diaphoresis, dyspnea and peripheral edema. ; No orthopnea, PND Respiratory: Negative for cough, hemoptysis, wheezing and stridor. No pleuritic chestpain. Gastrointestinal: Negative for diarrhea, constipation,  melena, blood in stool, hematemesis, jaundice and rectal bleeding.    Genitourinary: Negative for frequency, dysuria, incontinence,flank pain and hematuria; Musculoskeletal:  Negative for back pain and neck pain. Negative for swelling and trauma.;  Skin: . Negative for pruritus, rash, abrasions, bruising and skin lesion.; ulcerations Neuro: Negative for headache, lightheadedness and neck stiffness. Negative for weakness, altered level of consciousness , altered mental status, extremity weakness, burning feet, involuntary movement, seizure and syncope.  Psych: negative for anxiety, depression, insomnia, tearfulness, panic attacks, hallucinations, paranoia, suicidal or homicidal ideation    Past Medical History:  Diagnosis Date  . Acute esophagitis   . Anxiety   . Arthritis   . Chronic abdominal pain   . Chronic back pain   . Diabetes mellitus    type 1  . Diabetic gastroparesis associated with type 1 diabetes mellitus ([place])   . Erosive esophagitis 12/23/2013   "severe" per EGD   . Gastroparesis   . GERD (gastroesophageal reflux disease)   . Heart murmur    valvular stenosis  . Hypercholesteremia   . Hypertension   . Insomnia   . Nausea and vomiting    chronic, recurrent  . Neuropathy   . PUD (peptic ulcer disease)   . S/P arthroscopic knee surgery 07/04/2011  . Scoliosis 07/11/2012   Past Surgical History:  Procedure Laterality Date  . BIOPSY  06/17/2013   Procedure: GASTRIC ULCER AND ANTRAL BIOPSIES; ESOPHAGEAL BRUSHING;  Surgeon: Danie Binder, MD;  Location: [facility];  Service: Endoscopy;;  . BIOPSY N/A 12/23/2013   Procedure: ESOPHAGEAL BIOPSY;  Surgeon: Daneil Dolin, MD;  Location: [facility];  Service: Endoscopy;  Laterality: N/A;  . CHOLECYSTECTOMY N/A 01/28/2014   Procedure: LAPAROSCOPIC  CHOLECYSTECTOMY;  Surgeon: Jamesetta So, MD;  Location: [facility];  Service: General;  Laterality: N/A;  . ESOPHAGOGASTRODUODENOSCOPY (EGD) WITH PROPOFOL  06/17/2013   Dr. Oneida Alar: two gastric ulcers in fundus, mild antral gastritis, candida esophagitis  . ESOPHAGOGASTRODUODENOSCOPY (EGD) WITH PROPOFOL N/A 09/11/2013   Dr. [name]: abnormal hypopharynx, question  massively enlarged tonsils, stomach full of food precluded exam, needs EGD for verification of ulcer healing at a later date  . ESOPHAGOGASTRODUODENOSCOPY (EGD) WITH PROPOFOL N/A 12/23/2013   RMR: Severe exudative esophagitis likely predominatly reflux related. Superimposed Candida infection not excluded status post KOH brushing and biopsy. Localized excoriating gastric mucosa most consistant with trauma(vomiting). No evidence of peptic ulcer disease or other gastric/duodenal pathology. I suspect severe inflammation involving the distal esophagus may account  for at least some of patii  . KNEE ARTHROSCOPY  06/30/2011   Procedure: ARTHROSCOPY KNEE;  Surgeon: Carole Civil, MD;  Location: [facility];  Service: Orthopedics;  Laterality: Right;  diagnostic arthroscopy  . TYMPANOSTOMY TUBE PLACEMENT       reports that he has been smoking Cigarettes.  He has a 2.50 pack-year smoking history. He quit smokeless tobacco use about 3 years ago. He reports that he does not drink alcohol or use drugs.  Allergies  Allergen Reactions  . Hydrocodone Hives  . Tramadol Anaphylaxis and Other (See Comments)    Acid Reflux  . Ibuprofen Other (See Comments)    Stomach ulcers  . Naproxen Other (See Comments)    Stomach ulcers  . Sulfa Antibiotics Other (See Comments)    Stomach ulcers  . Morphine And Related Rash    Family History  Problem Relation Age of Onset  . Cancer Father   . Alcohol abuse Father   . Pseudochol deficiency Neg Hx   . Malignant hyperthermia Neg Hx   . Hypotension Neg Hx   . Anesthesia problems Neg Hx   . Colon cancer Neg Hx    Prior to Admission medications   Medication Sig Start Date End Date Taking? Authorizing Provider  amLODipine (NORVASC) 10 MG tablet Take 10 mg by mouth daily.  05/08/15   [provider]  aspirin 81 MG tablet Take 81 mg by mouth daily.    [provider]  gabapentin (NEURONTIN) 800 MG tablet Take 800 mg by mouth 3 (three) times daily.     [provider]  insulin NPH-regular Human (NOVOLIN 70/30) (70-30) 100 UNIT/ML injection Inject 20 Units into the skin 2 (two) times daily with a meal. 15 units in the morning and 25 at night. Patient taking differently: Inject 15-25 Units into the skin 2 (two) times daily with a meal. 15 units in the morning and 25 at night. 11/17/13   Kathie Dike, MD  insulin regular (NOVOLIN R,HUMULIN R) 100 units/mL injection Inject 2-10 Units into the skin 3 (three) times daily before meals. Sliding scale.    [provider]  lisinopril (PRINIVIL,ZESTRIL) 20 MG tablet Take 20 mg by mouth daily.    [provider]  metoCLOPramide (REGLAN) 10 MG tablet 10 mg 4 (four) times daily -  before meals and at bedtime. 04/26/15   [provider]  ondansetron (ZOFRAN ODT) 4 MG disintegrating tablet Take 1 tablet (4 mg total) by mouth every 4 (four) hours as needed for nausea or vomiting. 06/09/15   Mahala Menghini, PA-C  oxyCODONE-acetaminophen (PERCOCET) 10-325 MG tablet Take by mouth. 05/20/15   [provider]  pantoprazole (PROTONIX) 40 MG tablet Take 1 tablet (  40 mg total) by mouth 2 (two) times daily before a meal. 04/10/14   Annitta Needs, NP  pravastatin (PRAVACHOL) 20 MG tablet Take 20 mg by mouth daily.  09/10/13 05/24/15  [provider]  tiZANidine (ZANAFLEX) 4 MG tablet 4 mg every 8 (eight) hours as needed. 05/11/15   [provider]    Physical Exam: Vitals:   08/24/16 0100 08/24/16 0130 08/24/16 0200 08/24/16 0255  BP: 120/86 (!) 132/92 120/81 (!) 143/93  Pulse: 90 96 97 (!) 106  Resp: 12 11 12 12   Temp:      TempSrc:      SpO2: 99% 93% 92% 95%  Weight:        Constitutional: NAD, calm, comfortable Vitals:   08/24/16 0100 08/24/16 0130 08/24/16 0200 08/24/16 0255  BP: 120/86 (!) 132/92 120/81 (!) 143/93  Pulse: 90 96 97 (!) 106  Resp: 12 11 12 12   Temp:      TempSrc:      SpO2: 99% 93% 92% 95%  Weight:       Eyes: PERRL, lids and  conjunctivae normal ENMT: Mucous membranes are moist. Posterior pharynx clear of any exudate or lesions.Normal dentition.  Neck: normal, supple, no masses, no thyromegaly Respiratory: clear to auscultation bilaterally, no wheezing, no crackles. Normal respiratory effort. No accessory muscle use.  Cardiovascular: Regular rate and rhythm, no murmurs / rubs / gallops. No extremity edema. 2+ pedal pulses. No carotid bruits.  Abdomen: no tenderness, no masses palpated. No hepatosplenomegaly. Bowel sounds positive.  Musculoskeletal: no clubbing / cyanosis. No joint deformity upper and lower extremities. Good ROM, no contractures. Normal muscle tone.  Skin: no rashes, lesions, ulcers. No induration Neurologic: CN 2-12 grossly intact. Sensation intact, DTR normal. Strength 5/5 in all 4.  Psychiatric: Normal judgment and insight. Alert and oriented x 3. Normal mood.   Labs on Admission: I have personally reviewed following labs and imaging studies CBC:  Recent Labs Lab 08/24/16 0050  WBC 24.8*  NEUTROABS 20.7*  HGB 9.3*  HCT 26.9*  MCV 74.3*  PLT 546*   Basic Metabolic Panel:  Recent Labs Lab 08/24/16 0050  NA 131*  K 3.4*  CL 68*  CO2 41*  GLUCOSE 433*  BUN 62*  CREATININE 4.64*  CALCIUM 6.9*   GFR: CrCl cannot be calculated (Unknown ideal weight.). Liver Function Tests:  Recent Labs Lab 08/24/16 0050  AST 17  ALT 13*  ALKPHOS 117  BILITOT 1.4*  PROT 6.9  ALBUMIN 3.7    Recent Labs Lab 08/24/16 0050  LIPASE 18   CBG:  Recent Labs Lab 08/24/16 0000 08/24/16 0203 08/24/16 0254  GLUCAP 461* 423* 346*   Urine analysis:    Component Value Date/Time   COLORURINE AMBER (A) 11/13/2014 0155   APPEARANCEUR CLEAR 11/13/2014 0155   LABSPEC 1.025 11/13/2014 0155   PHURINE 6.0 11/13/2014 0155   GLUCOSEU >1000 (A) 11/13/2014 0155   HGBUR SMALL (A) 11/13/2014 0155   BILIRUBINUR SMALL (A) 11/13/2014 0155   KETONESUR 15 (A) 11/13/2014 0155   PROTEINUR 30 (A)  11/13/2014 0155   UROBILINOGEN 1.0 11/13/2014 0155   NITRITE NEGATIVE 11/13/2014 0155   LEUKOCYTESUR NEGATIVE 11/13/2014 0155   Radiological Exams on Admission: Dg Chest Port 1 View  Result Date: 08/24/2016 CLINICAL DATA:  31 year old male with vomiting. EXAM: PORTABLE CHEST 1 VIEW COMPARISON:  Chest radiograph dated 08/10/2016 FINDINGS: The heart size and mediastinal contours are within normal limits. Both lungs are clear. The visualized skeletal structures are unremarkable.  IMPRESSION: No active disease. Electronically Signed   By: Anner Crete M.D.   On: 08/24/2016 01:56   EKG: Independently reviewed.   Assessment/Plan Active Problems:   Back pain   Uncontrolled diabetes mellitus (HCC)   DKA, type 1 (HCC)   Nausea with vomiting   AKI (acute kidney injury) ([place])   Volume depletion   Narcotic withdrawal (HCC)   Hypochloremic alkalosis   PLAN:   AKI:  With contracting metabolic alkalosis.  I am not convinced he has DKA.  Will give IVF resuscitation with NS.  Follow serial basic metabolic profiles.  His volume depletion is from poor oral intake and vomiting.  Follow Cr down to at least 2.5 mg per dL.   Type 1 DM with Hyperglycemia:  Will continue with IV Insulin drip and IVF.  He has AG of 22, but no acidosis.   Nausea and vomiting:  I suspect he has narcotic withdrawal in addition to his gastroparesis. Will use Zofran cautiously with prolonged QTc.   QTc prolongation:  D/c Reglan.  Mag 1.8, will give 1 gram.  Supplement K, avoid drugs with QTc prolongation.   Narcotic withdrawal:  Will give some IV Dilaudid.    DVT prophylaxis: subQ heparin.  Code Status: FULL CODE.  Family Communication: None.  Disposition Plan: Home.  Consults called: None.  Admission status: Inpatient.    Kamar Callender MD FACP. [facility]  If 7PM-7AM, please contact night-coverage [email or web address] Password [email or web address]  08/24/2016, 2:58 AM

## 2016-08-24 NOTE — ED Notes (Signed)
IVC rescinded by EDP, original paperwork sent with officer.

## 2016-08-24 NOTE — Progress Notes (Signed)
Patient has c/o n&v most of this shift. PRN Zofran has been somewhat effective. Emesis noted to be light brown liquid. Patient continues to be NPO and is on IVF at this time.

## 2016-08-24 NOTE — Plan of Care (Signed)
Problem: Education: Goal: Knowledge of [facility] Education information/materials will improve Outcome: Completed/Met Date Met: 08/24/16 Patient confirmed understanding

## 2016-08-24 NOTE — Progress Notes (Signed)
CRITICAL VALUE ALERT  Critical Value:  K+ 2.7  Date & Time Notied:  08/24/16 1530  Provider Notified: Dr.Tat  Orders Received/Actions taken:   MD ordered potassium

## 2016-08-25 ENCOUNTER — Inpatient Hospital Stay (HOSPITAL_COMMUNITY): Payer: Managed Care, Other (non HMO)

## 2016-08-25 DIAGNOSIS — Z720 Tobacco use: Secondary | ICD-10-CM

## 2016-08-25 LAB — GLUCOSE, CAPILLARY
GLUCOSE-CAPILLARY: 148 mg/dL — AB (ref 65–99)
GLUCOSE-CAPILLARY: 149 mg/dL — AB (ref 65–99)
GLUCOSE-CAPILLARY: 156 mg/dL — AB (ref 65–99)
GLUCOSE-CAPILLARY: 159 mg/dL — AB (ref 65–99)
GLUCOSE-CAPILLARY: 161 mg/dL — AB (ref 65–99)
GLUCOSE-CAPILLARY: 168 mg/dL — AB (ref 65–99)
GLUCOSE-CAPILLARY: 172 mg/dL — AB (ref 65–99)
GLUCOSE-CAPILLARY: 180 mg/dL — AB (ref 65–99)
GLUCOSE-CAPILLARY: 196 mg/dL — AB (ref 65–99)
GLUCOSE-CAPILLARY: 197 mg/dL — AB (ref 65–99)
GLUCOSE-CAPILLARY: 206 mg/dL — AB (ref 65–99)
GLUCOSE-CAPILLARY: 236 mg/dL — AB (ref 65–99)
Glucose-Capillary: 139 mg/dL — ABNORMAL HIGH (ref 65–99)
Glucose-Capillary: 172 mg/dL — ABNORMAL HIGH (ref 65–99)
Glucose-Capillary: 127 mg/dL — ABNORMAL HIGH (ref 65–99)
Glucose-Capillary: 143 mg/dL — ABNORMAL HIGH (ref 65–99)
Glucose-Capillary: 150 mg/dL — ABNORMAL HIGH (ref 65–99)
Glucose-Capillary: 148 mg/dL — ABNORMAL HIGH (ref 65–99)
Glucose-Capillary: 132 mg/dL — ABNORMAL HIGH (ref 65–99)
Glucose-Capillary: 186 mg/dL — ABNORMAL HIGH (ref 65–99)
Glucose-Capillary: 155 mg/dL — ABNORMAL HIGH (ref 65–99)
Glucose-Capillary: 138 mg/dL — ABNORMAL HIGH (ref 65–99)
Glucose-Capillary: 132 mg/dL — ABNORMAL HIGH (ref 65–99)
Glucose-Capillary: 162 mg/dL — ABNORMAL HIGH (ref 65–99)

## 2016-08-25 LAB — BASIC METABOLIC PANEL
ANION GAP: 12 (ref 5–15)
ANION GAP: 13 (ref 5–15)
ANION GAP: 15 (ref 5–15)
ANION GAP: 16 — AB (ref 5–15)
Anion gap: 12 (ref 5–15)
BUN: 42 mg/dL — ABNORMAL HIGH (ref 6–20)
BUN: 40 mg/dL — ABNORMAL HIGH (ref 6–20)
BUN: 35 mg/dL — ABNORMAL HIGH (ref 6–20)
BUN: 31 mg/dL — AB (ref 6–20)
BUN: 33 mg/dL — AB (ref 6–20)
CALCIUM: 8.5 mg/dL — AB (ref 8.9–10.3)
CHLORIDE: 79 mmol/L — AB (ref 101–111)
CHLORIDE: 79 mmol/L — AB (ref 101–111)
CHLORIDE: 82 mmol/L — AB (ref 101–111)
CHLORIDE: 83 mmol/L — AB (ref 101–111)
CHLORIDE: 84 mmol/L — AB (ref 101–111)
CO2: 36 mmol/L — AB (ref 22–32)
CO2: 36 mmol/L — ABNORMAL HIGH (ref 22–32)
CO2: 36 mmol/L — ABNORMAL HIGH (ref 22–32)
CO2: 37 mmol/L — AB (ref 22–32)
CO2: 37 mmol/L — AB (ref 22–32)
CREATININE: 3.68 mg/dL — AB (ref 0.61–1.24)
Calcium: 7.8 mg/dL — ABNORMAL LOW (ref 8.9–10.3)
Calcium: 7.9 mg/dL — ABNORMAL LOW (ref 8.9–10.3)
Calcium: 8.5 mg/dL — ABNORMAL LOW (ref 8.9–10.3)
Calcium: 8.6 mg/dL — ABNORMAL LOW (ref 8.9–10.3)
Creatinine, Ser: 3.81 mg/dL — ABNORMAL HIGH (ref 0.61–1.24)
Creatinine, Ser: 3.64 mg/dL — ABNORMAL HIGH (ref 0.61–1.24)
Creatinine, Ser: 3.56 mg/dL — ABNORMAL HIGH (ref 0.61–1.24)
Creatinine, Ser: 3.54 mg/dL — ABNORMAL HIGH (ref 0.61–1.24)
GFR calc Af Amer: 24 mL/min — ABNORMAL LOW (ref >60)
GFR calc non Af Amer: 19 mL/min — ABNORMAL LOW (ref >60)
GFR calc non Af Amer: 20 mL/min — ABNORMAL LOW (ref >60)
GFR calc non Af Amer: 20 mL/min — ABNORMAL LOW (ref >60)
GFR calc non Af Amer: 21 mL/min — ABNORMAL LOW (ref >60)
GFR calc non Af Amer: 21 mL/min — ABNORMAL LOW (ref >60)
GFR, EST AFRICAN AMERICAN: 22 mL/min — AB (ref >60)
GFR, EST AFRICAN AMERICAN: 23 mL/min — AB (ref >60)
GFR, EST AFRICAN AMERICAN: 24 mL/min — AB (ref >60)
GFR, EST AFRICAN AMERICAN: 24 mL/min — AB (ref >60)
Glucose, Bld: 178 mg/dL — ABNORMAL HIGH (ref 65–99)
Glucose, Bld: 207 mg/dL — ABNORMAL HIGH (ref 65–99)
Glucose, Bld: 143 mg/dL — ABNORMAL HIGH (ref 65–99)
Glucose, Bld: 168 mg/dL — ABNORMAL HIGH (ref 65–99)
Glucose, Bld: 151 mg/dL — ABNORMAL HIGH (ref 65–99)
POTASSIUM: 2.8 mmol/L — AB (ref 3.5–5.1)
POTASSIUM: 3 mmol/L — AB (ref 3.5–5.1)
POTASSIUM: 3.1 mmol/L — AB (ref 3.5–5.1)
Potassium: 3 mmol/L — ABNORMAL LOW (ref 3.5–5.1)
Potassium: 2.8 mmol/L — ABNORMAL LOW (ref 3.5–5.1)
SODIUM: 130 mmol/L — AB (ref 135–145)
SODIUM: 130 mmol/L — AB (ref 135–145)
SODIUM: 132 mmol/L — AB (ref 135–145)
SODIUM: 132 mmol/L — AB (ref 135–145)
SODIUM: 133 mmol/L — AB (ref 135–145)

## 2016-08-25 LAB — HIV ANTIBODY (ROUTINE TESTING W REFLEX): HIV SCREEN 4TH GENERATION: NONREACTIVE

## 2016-08-25 MED ORDER — PROMETHAZINE HCL 25 MG/ML IJ SOLN
12.5000 mg | Freq: Once | INTRAMUSCULAR | Status: AC
  Administered 2016-08-25: 12.5 mg via INTRAVENOUS
  Filled 2016-08-25: qty 1

## 2016-08-25 MED ORDER — POTASSIUM CHLORIDE 10 MEQ/100ML IV SOLN
10.0000 meq | INTRAVENOUS | Status: AC
  Administered 2016-08-25 (×4): 10 meq via INTRAVENOUS
  Filled 2016-08-25 (×4): qty 100

## 2016-08-25 MED ORDER — PHENOL 1.4 % MT LIQD
1.0000 | OROMUCOSAL | Status: DC | PRN
  Administered 2016-08-25: 1 via OROMUCOSAL
  Filled 2016-08-25 (×2): qty 177

## 2016-08-25 MED ORDER — METOPROLOL TARTRATE 25 MG PO TABS
25.0000 mg | ORAL_TABLET | Freq: Two times a day (BID) | ORAL | Status: DC
  Administered 2016-08-25 – 2016-08-27 (×4): 25 mg via ORAL
  Filled 2016-08-25 (×4): qty 1

## 2016-08-25 MED ORDER — METOCLOPRAMIDE HCL 5 MG/ML IJ SOLN
10.0000 mg | Freq: Four times a day (QID) | INTRAMUSCULAR | Status: DC
  Administered 2016-08-25: 10 mg via INTRAVENOUS
  Filled 2016-08-25: qty 2

## 2016-08-25 MED ORDER — LORAZEPAM 2 MG/ML IJ SOLN
1.0000 mg | Freq: Three times a day (TID) | INTRAMUSCULAR | Status: DC | PRN
  Administered 2016-08-25 – 2016-08-26 (×3): 1 mg via INTRAVENOUS
  Filled 2016-08-25 (×3): qty 1

## 2016-08-25 MED ORDER — HYDRALAZINE HCL 20 MG/ML IJ SOLN: 5.0000 mg | Freq: Four times a day (QID) | INTRAMUSCULAR | Status: DC | PRN

## 2016-08-25 MED ORDER — CHLORHEXIDINE GLUCONATE 0.12 % MT SOLN
15.0000 mL | Freq: Two times a day (BID) | OROMUCOSAL | Status: DC
  Administered 2016-08-25 – 2016-08-27 (×3): 15 mL via OROMUCOSAL
  Filled 2016-08-25 (×5): qty 15

## 2016-08-25 MED ORDER — ORAL CARE MOUTH RINSE
15.0000 mL | Freq: Two times a day (BID) | OROMUCOSAL | Status: DC
  Administered 2016-08-27: 15 mL via OROMUCOSAL

## 2016-08-25 MED ORDER — PANTOPRAZOLE SODIUM 40 MG IV SOLR
40.0000 mg | INTRAVENOUS | Status: DC
  Administered 2016-08-25 – 2016-08-27 (×3): 40 mg via INTRAVENOUS
  Filled 2016-08-25 (×3): qty 40

## 2016-08-25 NOTE — Progress Notes (Signed)
MD said pts QTc is >500, so not safe for phenergan at this time. MD did order ativan that can be given with the zofran for added strength for n/v.  Kahne Helfand Rica Mote, RN

## 2016-08-25 NOTE — Progress Notes (Signed)
PROGRESS NOTE  Brent Daniels MLJ:[id number] DOB: January 19, 1986 DOA: 08/23/2016 PCP: [name], MD  Brief History:  31 y/o male with history of DM type 1, HTN, HLD, chronic pain syndrome with opioid dependence presented with 2 day hx of N/V and abdominal pain.  Apparently, the patient was [facility] by his mother, but this was rescinded by Dr. [name] in ED as the patient was appropriate without SI/HI.  The patient states that he has been faithful with his insulin, but he endorses dietary indiscretions. He denies any fevers, chills, chest discomfort, short of breath, hematemesis, medication, melena. He states that he ran out of his Percocet approximately one week prior to admission. He [facility] at [facility] for his chronic opioids. He denies any illegal drug use. Upon presentation, the patient was noted to have a serum glucose of 433 with anion gap of 22.  In addition, the patient was noted to have a serum glucose 433 and serum creatinine of 4.64. He was started on IV fluids and intravenous insulin. Notably, the patient has had numerous hospitalizations in the past 2 months for intractable nausea and vomiting. The patient has been admitted to [facility] on 06/13/2016, 07/30/2016, 08/11/2016. Assessment/Plan: Hyperosmolar state -Patient presented with serum glucose of 433 with anion gap of 22 with bicarbonate of 41. -Continue IV fluids -Continue IV insulin until anion gap closes although gap may represent impaired renal re-absorption due to renal failure -BMP every 4 hours  Diabetes mellitus type 1, uncontrolled with hyperglycemia -Home regimen of Lantus 20 units daily - Transition to subcutaneous insulin once anion gap closes  -Patient endorses dietary indiscretion   AKI -Patient apparently was discharged from [facility] on 08/11/16 with serum creatinine 2.7 -pt likely has progression of underlying CKD -renal US--no hydronephrosis -holding  lisinopril  Diabetic gastroparesis/intractable nausea and vomiting -Multifactorial including hyperosmolar state, diabetic gastroparesis, opioids -12/23/2013 EGD--severe exudative esophagitis cannot rule out superimposed Candida infection -Start PPI -still continues to vomit--likely due to acute medical condition exacerbating his functional gastroparesis -judicious metoclopramide due to prolonged QTc -2 view abd xray -continue IVF  Chronic abdominal pain -lipase 18 -pain is out of proportion with objective exam findings  Essential hypertension -Continue amlodipine  Hyperlipidemia -continue pravastatin  Prolonged QTc -repeat EKG -optimize electrolytes  Hypokalemia -replete -Mag 2.0   Disposition Plan:   Home in 2-3 days  Family Communication: No  Family at bedside-   Consultants:  none  Code Status:  FULL   DVT Prophylaxis:  [place] Heparin   Procedures: As Listed in Progress Note Above  Antibiotics: None     Subjective: Patient continues to have emesis. He states his abdominal pain is about the same as usual. Denies any chest pain, shortness breath, headache, fevers, chills, dysuria, hematuria. No hematemesis, hematochezia, melena.  Objective: Vitals:   08/25/16 0756 08/25/16 0800 08/25/16 0900 08/25/16 1000  BP:  (!) 177/116 (!) 149/86 (!) 166/113  Pulse:      Resp:  17 17   Temp: (!) 97.5 F (36.4 C)     TempSrc: Axillary     SpO2:      Weight:      Height:        Intake/Output Summary (Last 24 hours) at 08/25/16 1223 Last data filed at 08/25/16 0945  Gross per 24 hour  Intake          3136.14 ml  Output  5000 ml  Net         -1863.86 ml   Weight change: 1.184 kg (2 lb 9.8 oz) Exam:   General:  Pt is alert, follows commands appropriately, not in acute distress  HEENT: No icterus, No thrush, No neck mass, [place]/AT  Cardiovascular: RRR, S1/S2, no rubs, no gallops  Respiratory: CTA bilaterally, no wheezing, no  crackles, no rhonchi  Abdomen: Soft/+BS, epigastric tender, non distended, no guarding  Extremities: No edema, No lymphangitis, No petechiae, No rashes, no synovitis   Data Reviewed: I have personally reviewed following labs and imaging studies Basic Metabolic Panel:  Recent Labs Lab 08/24/16 0050 08/24/16 0723 08/24/16 1352 08/24/16 1733 08/25/16 0023 08/25/16 0537  NA 131* 136 131* 130* 132* 130*  K 3.4* 2.9* 2.7* 2.7* 2.8* 3.0*  CL 68* 77* 74* 76* 79* 79*  CO2 41* 42* 37* 38* 37* 36*  GLUCOSE 433* 188* 170* 156* 178* 207*  BUN 62* 58* 52* 48* 42* 40*  CREATININE 4.64* 4.42* 4.09* 4.04* 3.81* 3.64*  CALCIUM 6.9* 6.8* 7.2* 7.2* 7.8* 7.9*  MG 1.8  --  2.0  --   --   --   PHOS 8.0*  --   --   --   --   --    Liver Function Tests:  Recent Labs Lab 08/24/16 0050  AST 17  ALT 13*  ALKPHOS 117  BILITOT 1.4*  PROT 6.9  ALBUMIN 3.7    Recent Labs Lab 08/24/16 0050  LIPASE 18   No results for input(s): AMMONIA in the last 168 hours. Coagulation Profile: No results for input(s): INR, PROTIME in the last 168 hours. CBC:  Recent Labs Lab 08/24/16 0050  WBC 24.8*  NEUTROABS 20.7*  HGB 9.3*  HCT 26.9*  MCV 74.3*  PLT 519*   Cardiac Enzymes: No results for input(s): CKTOTAL, CKMB, CKMBINDEX, TROPONINI in the last 168 hours. BNP: Invalid input(s): POCBNP CBG:  Recent Labs Lab 08/25/16 0845 08/25/16 0946 08/25/16 1046 08/25/16 1059 08/25/16 1152  GLUCAP 148* 159* 143* 150* 148*   HbA1C: No results for input(s): HGBA1C in the last 72 hours. Urine analysis:    Component Value Date/Time   COLORURINE YELLOW 08/24/2016 0402   APPEARANCEUR CLEAR 08/24/2016 0402   LABSPEC 1.015 08/24/2016 0402   PHURINE 6.0 08/24/2016 0402   GLUCOSEU >=500 (A) 08/24/2016 0402   HGBUR TRACE (A) 08/24/2016 0402   BILIRUBINUR SMALL (A) 08/24/2016 0402   KETONESUR 15 (A) 08/24/2016 0402   PROTEINUR 100 (A) 08/24/2016 0402   UROBILINOGEN 1.0 11/13/2014 0155   NITRITE  NEGATIVE 08/24/2016 0402   LEUKOCYTESUR NEGATIVE 08/24/2016 0402   Sepsis Labs: @LABRCNTIP (procalcitonin:4,lacticidven:4) ) Recent Results (from the past 240 hour(s))  MRSA PCR Screening     Status: None   Collection Time: 08/24/16  4:00 AM  Result Value Ref Range Status   MRSA by PCR NEGATIVE NEGATIVE Final    Comment:        The GeneXpert MRSA Assay (FDA approved for NASAL specimens only), is one component of a comprehensive MRSA colonization surveillance program. It is not intended to diagnose MRSA infection nor to guide or monitor treatment for MRSA infections.      Scheduled Meds: . amLODipine  10 mg Oral Daily  . aspirin EC  81 mg Oral Daily  . chlorhexidine  15 mL Mouth Rinse BID  . heparin  5,000 Units Subcutaneous Q8H  . mouth rinse  15 mL Mouth Rinse q12n4p  . metoCLOPramide (REGLAN) injection  10 mg Intravenous Q6H  . pravastatin  20 mg Oral Daily   Continuous Infusions: . dextrose 5 % and 0.45% NaCl 125 mL/hr at 08/25/16 0928  . insulin (NOVOLIN-R) infusion 2.7 Units/hr (08/24/16 1807)  . potassium chloride      Procedures/Studies: US Renal  Result Date: 08/24/2016 CLINICAL DATA:  Increased glucose. Type 1 diabetic. Acute kidney injury. EXAM: RENAL / URINARY TRACT ULTRASOUND COMPLETE COMPARISON:  None. FINDINGS: Right Kidney: Length: 12.6 cm. Echogenicity within normal limits. No mass or hydronephrosis visualized. Left Kidney: Length: 12.6 cm. Echogenicity within normal limits. No mass or hydronephrosis visualized. Bladder: Appears normal for degree of bladder distention. IMPRESSION: Normal renal ultrasound. Electronically Signed   By: Franki Cabot M.D.   On: 08/24/2016 20:15   Dg Chest Port 1 View  Result Date: 08/24/2016 CLINICAL DATA:  31 year old male with vomiting. EXAM: PORTABLE CHEST 1 VIEW COMPARISON:  Chest radiograph dated 08/10/2016 FINDINGS: The heart size and mediastinal contours are within normal limits. Both lungs are clear. The visualized  skeletal structures are unremarkable. IMPRESSION: No active disease. Electronically Signed   By: Anner Crete M.D.   On: 08/24/2016 01:56    Zaryiah Barz, DO  [facility] Pager [phone]  If 7PM-7AM, please contact night-coverage [email or web address] Password [email or web address] 08/25/2016, 12:23 PM   LOS: 1 day

## 2016-08-25 NOTE — Progress Notes (Signed)
Paged the swing MD to get Zofran frequency changed to every 4 to every 6 earlier in my shift. Pt did have some relief but still vomiting. Paged Md to see if pt may could try phenergan instead.   Rosalio Catterton Rica Mote, RN 2:51 AM

## 2016-08-26 DIAGNOSIS — R112 Nausea with vomiting, unspecified: Secondary | ICD-10-CM

## 2016-08-26 DIAGNOSIS — E1010 Type 1 diabetes mellitus with ketoacidosis without coma: Secondary | ICD-10-CM

## 2016-08-26 DIAGNOSIS — R1110 Vomiting, unspecified: Secondary | ICD-10-CM

## 2016-08-26 LAB — BASIC METABOLIC PANEL
Anion gap: 12 (ref 5–15)
Anion gap: 12 (ref 5–15)
Anion gap: 10 (ref 5–15)
BUN: 30 mg/dL — AB (ref 6–20)
BUN: 29 mg/dL — AB (ref 6–20)
BUN: 28 mg/dL — AB (ref 6–20)
CALCIUM: 9 mg/dL (ref 8.9–10.3)
CALCIUM: 9 mg/dL (ref 8.9–10.3)
CALCIUM: 8.9 mg/dL (ref 8.9–10.3)
CHLORIDE: 89 mmol/L — AB (ref 101–111)
CHLORIDE: 88 mmol/L — AB (ref 101–111)
CHLORIDE: 89 mmol/L — AB (ref 101–111)
CO2: 34 mmol/L — AB (ref 22–32)
CO2: 34 mmol/L — AB (ref 22–32)
CO2: 37 mmol/L — AB (ref 22–32)
CREATININE: 3.56 mg/dL — AB (ref 0.61–1.24)
CREATININE: 3.62 mg/dL — AB (ref 0.61–1.24)
Creatinine, Ser: 3.7 mg/dL — ABNORMAL HIGH (ref 0.61–1.24)
GFR calc non Af Amer: 20 mL/min — ABNORMAL LOW (ref >60)
GFR calc non Af Amer: 21 mL/min — ABNORMAL LOW (ref >60)
GFR calc non Af Amer: 21 mL/min — ABNORMAL LOW (ref >60)
GFR, EST AFRICAN AMERICAN: 23 mL/min — AB (ref >60)
GFR, EST AFRICAN AMERICAN: 24 mL/min — AB (ref >60)
GFR, EST AFRICAN AMERICAN: 24 mL/min — AB (ref >60)
GLUCOSE: 132 mg/dL — AB (ref 65–99)
Glucose, Bld: 146 mg/dL — ABNORMAL HIGH (ref 65–99)
Glucose, Bld: 199 mg/dL — ABNORMAL HIGH (ref 65–99)
Potassium: 3.1 mmol/L — ABNORMAL LOW (ref 3.5–5.1)
Potassium: 3.2 mmol/L — ABNORMAL LOW (ref 3.5–5.1)
Potassium: 3 mmol/L — ABNORMAL LOW (ref 3.5–5.1)
SODIUM: 135 mmol/L (ref 135–145)
Sodium: 134 mmol/L — ABNORMAL LOW (ref 135–145)
Sodium: 136 mmol/L (ref 135–145)

## 2016-08-26 LAB — GLUCOSE, CAPILLARY
GLUCOSE-CAPILLARY: 94 mg/dL (ref 65–99)
GLUCOSE-CAPILLARY: 144 mg/dL — AB (ref 65–99)
GLUCOSE-CAPILLARY: 195 mg/dL — AB (ref 65–99)
GLUCOSE-CAPILLARY: 151 mg/dL — AB (ref 65–99)
GLUCOSE-CAPILLARY: 102 mg/dL — AB (ref 65–99)
Glucose-Capillary: 183 mg/dL — ABNORMAL HIGH (ref 65–99)
Glucose-Capillary: 95 mg/dL (ref 65–99)
Glucose-Capillary: 175 mg/dL — ABNORMAL HIGH (ref 65–99)
Glucose-Capillary: 150 mg/dL — ABNORMAL HIGH (ref 65–99)
Glucose-Capillary: 124 mg/dL — ABNORMAL HIGH (ref 65–99)
Glucose-Capillary: 121 mg/dL — ABNORMAL HIGH (ref 65–99)
Glucose-Capillary: 133 mg/dL — ABNORMAL HIGH (ref 65–99)

## 2016-08-26 LAB — CBC
HEMATOCRIT: 29.1 % — AB (ref 39.0–52.0)
Hemoglobin: 9.5 g/dL — ABNORMAL LOW (ref 13.0–17.0)
MCH: 25.6 pg — AB (ref 26.0–34.0)
MCHC: 32.6 g/dL (ref 30.0–36.0)
MCV: 78.4 fL (ref 78.0–100.0)
Platelets: 445 10*3/uL — ABNORMAL HIGH (ref 150–400)
RBC: 3.71 MIL/uL — AB (ref 4.22–5.81)
RDW: 18.3 % — ABNORMAL HIGH (ref 11.5–15.5)
WBC: 8 10*3/uL (ref 4.0–10.5)

## 2016-08-26 LAB — HEMOGLOBIN A1C
HEMOGLOBIN A1C: 9.7 % — AB (ref 4.8–5.6)
MEAN PLASMA GLUCOSE: 232 mg/dL

## 2016-08-26 LAB — CORTISOL-AM, BLOOD: Cortisol - AM: 8.5 ug/dL (ref 6.7–22.6)

## 2016-08-26 MED ORDER — INSULIN ASPART 100 UNIT/ML ~~LOC~~ SOLN: 0.0000 [IU] | Freq: Every day | SUBCUTANEOUS | Status: DC

## 2016-08-26 MED ORDER — INSULIN ASPART 100 UNIT/ML ~~LOC~~ SOLN
0.0000 [IU] | Freq: Three times a day (TID) | SUBCUTANEOUS | Status: DC
  Administered 2016-08-26: 3 [IU] via SUBCUTANEOUS
  Administered 2016-08-26: 2 [IU] via SUBCUTANEOUS
  Administered 2016-08-27: 8 [IU] via SUBCUTANEOUS
  Administered 2016-08-27: 2 [IU] via SUBCUTANEOUS

## 2016-08-26 MED ORDER — INSULIN GLARGINE 100 UNIT/ML ~~LOC~~ SOLN
10.0000 [IU] | Freq: Every day | SUBCUTANEOUS | Status: DC
  Administered 2016-08-26 – 2016-08-27 (×2): 10 [IU] via SUBCUTANEOUS
  Filled 2016-08-26 (×3): qty 0.1

## 2016-08-26 MED ORDER — POTASSIUM CHLORIDE IN NACL 40-0.9 MEQ/L-% IV SOLN
INTRAVENOUS | Status: DC
  Administered 2016-08-26 – 2016-08-27 (×3): 125 mL/h via INTRAVENOUS

## 2016-08-26 NOTE — Progress Notes (Signed)
PROGRESS NOTE  Brent Daniels SVX:[id number] DOB: 06/27/1985 DOA: 08/23/2016 PCP: [name], MD  Brief History:  31 y/o male with history of DM type 1, HTN, HLD, chronic pain syndrome with opioid dependence presented with 2 day hx of N/V and abdominal pain. Apparently, the patient was [facility] by his mother, but this was rescinded by Dr. [name] in ED as the patient was appropriate without SI/HI. The patient states that he has been faithful with his insulin, but he endorses dietary indiscretions. He denies any fevers, chills, chest discomfort, short of breath, hematemesis, medication, melena. He states that he ran out of his Percocet approximately one week prior to admission. [facility] for his chronic opioids. He denies any illegal drug use. Upon presentation, the patient was noted to have a serum glucose of 433 with anion gap of 22. In addition, the patient was noted to have a serumglucose 433 and serum creatinineof 4.64. He was started on IV fluids and intravenous insulin. Notably, the patient has had numerous hospitalizations in the past 2 months for intractable nausea and vomiting. The patient has been admitted to [facility] on 06/13/2016, 07/30/2016, 08/11/2016.  Assessment/Plan: Hyperosmolar state -Patient presented with serum glucose of 433 with anion gap of 22 with bicarbonate of 41. -Continue IV fluids -Continue IV insulin until anion gap closes although gap may represent impaired renal re-absorption due to renal failure -7/21--transition to [place] insulin--start Lantus 10 units daily  Diabetes mellitus type 1, uncontrolled with hyperglycemia -Home regimen of Lantus 20 units daily - start novolog sliding sclae -Patient endorses dietary indiscretion   AKI -Patient apparently was discharged from [facility] on 08/11/16 with serum creatinine 2.7 -pt likely has progression of underlying CKD -renal US--no hydronephrosis -holding  lisinopril  Diabetic gastroparesis/intractable nausea and vomiting -Multifactorial including hyperosmolar state, diabetic gastroparesis, opioids -12/23/2013 EGD--severe exudative esophagitis cannot rule out superimposed Candida infection -continue  PPI -still continues to vomit--likely due to acute medical condition exacerbating his functional gastroparesis -judicious metoclopramide due to prolonged QTc -2 view abd xray--negative -continue IVF -start full liquids  Chronic abdominal pain -lipase 18 -pain is out of proportion with objective exam findings  Essential hypertension -Continue amlodipine  Hyperlipidemia -continue pravastatin  Prolonged QTc -repeat EKG--QTc = 510; personally reviewed -optimize electrolytes  Hypokalemia -replete -Mag 2.0     Disposition Plan:   Home 7/22 or 7/23 if stable Family Communication:  No Family at bedside  Consultants:  none  Code Status:  FULL  DVT Prophylaxis:  [place] Heparin   Procedures: As Listed in Progress Note Above  Antibiotics: None    Subjective: Patient denies fevers, chills, headache, chest pain, dyspnea, nausea, vomiting, diarrhea, abdominal pain, dysuria, hematuria, hematochezia, and melena. He continues to complain of his chronic abdominal pain and back pain which is unchanged.  Objective: Vitals:   08/26/16 0400 08/26/16 0735 08/26/16 1032 08/26/16 1033  BP:   (!) 171/114 (!) 171/114  Pulse:  96  92  Resp:  14    Temp: 97.8 F (36.6 C) 98 F (36.7 C)    TempSrc: Oral Oral    SpO2:  98%    Weight: 85 kg (187 lb 6.3 oz)     Height:        Intake/Output Summary (Last 24 hours) at 08/26/16 1054 Last data filed at 08/26/16 0850  Gross per 24 hour  Intake          2688.63 ml  Output             2200 ml  Net           488.63 ml   Weight change: -0.1 kg (-3.5 oz) Exam:   General:  Pt is alert, follows commands appropriately, not in acute distress  HEENT: No icterus, No thrush, No neck  mass, [place]/AT  Cardiovascular: RRR, S1/S2, no rubs, no gallops  Respiratory: CTA bilaterally, no wheezing, no crackles, no rhonchi  Abdomen: Soft/+BS, non tender, non distended, no guarding  Extremities: No edema, No lymphangitis, No petechiae, No rashes, no synovitis   Data Reviewed: I have personally reviewed following labs and imaging studies Basic Metabolic Panel:  Recent Labs Lab 08/24/16 0050  08/24/16 1352  08/25/16 1809 08/25/16 2118 08/26/16 0201 08/26/16 0612 08/26/16 0911  NA 131*  < > 131*  < > 130* 132* 135 134* 136  K 3.4*  < > 2.7*  < > 3.0* 3.1* 3.1* 3.2* 3.0*  CL 68*  < > 74*  < > 82* 84* 89* 88* 89*  CO2 41*  < > 37*  < > 36* 36* 34* 34* 37*  GLUCOSE 433*  < > 170*  < > 168* 151* 146* 199* 132*  BUN 62*  < > 52*  < > 33* 31* 30* 29* 28*  CREATININE 4.64*  < > 4.09*  < > 3.56* 3.54* 3.70* 3.56* 3.62*  CALCIUM 6.9*  < > 7.2*  < > 8.5* 8.6* 9.0 9.0 8.9  MG 1.8  --  2.0  --   --   --   --   --   --   PHOS 8.0*  --   --   --   --   --   --   --   --   < > = values in this interval not displayed. Liver Function Tests:  Recent Labs Lab 08/24/16 0050  AST 17  ALT 13*  ALKPHOS 117  BILITOT 1.4*  PROT 6.9  ALBUMIN 3.7    Recent Labs Lab 08/24/16 0050  LIPASE 18   No results for input(s): AMMONIA in the last 168 hours. Coagulation Profile: No results for input(s): INR, PROTIME in the last 168 hours. CBC:  Recent Labs Lab 08/24/16 0050 08/26/16 0612  WBC 24.8* 8.0  NEUTROABS 20.7*  --   HGB 9.3* 9.5*  HCT 26.9* 29.1*  MCV 74.3* 78.4  PLT 519* 445*   Cardiac Enzymes: No results for input(s): CKTOTAL, CKMB, CKMBINDEX, TROPONINI in the last 168 hours. BNP: Invalid input(s): POCBNP CBG:  Recent Labs Lab 08/26/16 0559 08/26/16 0653 08/26/16 0810 08/26/16 0915 08/26/16 1019  GLUCAP 195* 175* 150* 124* 121*   HbA1C:  Recent Labs  08/24/16 0050  HGBA1C 9.7*   Urine analysis:    Component Value Date/Time   COLORURINE YELLOW  08/24/2016 0402   APPEARANCEUR CLEAR 08/24/2016 0402   LABSPEC 1.015 08/24/2016 0402   PHURINE 6.0 08/24/2016 0402   GLUCOSEU >=500 (A) 08/24/2016 0402   HGBUR TRACE (A) 08/24/2016 0402   BILIRUBINUR SMALL (A) 08/24/2016 0402   KETONESUR 15 (A) 08/24/2016 0402   PROTEINUR 100 (A) 08/24/2016 0402   UROBILINOGEN 1.0 11/13/2014 0155   NITRITE NEGATIVE 08/24/2016 0402   LEUKOCYTESUR NEGATIVE 08/24/2016 0402   Sepsis Labs: @LABRCNTIP (procalcitonin:4,lacticidven:4) ) Recent Results (from the past 240 hour(s))  MRSA PCR Screening     Status: None   Collection Time: 08/24/16  4:00 AM  Result Value Ref Range Status  MRSA by PCR NEGATIVE NEGATIVE Final    Comment:        The GeneXpert MRSA Assay (FDA approved for NASAL specimens only), is one component of a comprehensive MRSA colonization surveillance program. It is not intended to diagnose MRSA infection nor to guide or monitor treatment for MRSA infections.      Scheduled Meds: . amLODipine  10 mg Oral Daily  . aspirin EC  81 mg Oral Daily  . chlorhexidine  15 mL Mouth Rinse BID  . heparin  5,000 Units Subcutaneous Q8H  . insulin aspart  0-15 Units Subcutaneous TID WC  . insulin aspart  0-5 Units Subcutaneous QHS  . insulin glargine  10 Units Subcutaneous Daily  . mouth rinse  15 mL Mouth Rinse q12n4p  . metoprolol tartrate  25 mg Oral BID  . pantoprazole (PROTONIX) IV  40 mg Intravenous Q24H  . pravastatin  20 mg Oral Daily   Continuous Infusions: . 0.9 % NaCl with KCl 40 mEq / L      Procedures/Studies: US Renal  Result Date: 08/24/2016 CLINICAL DATA:  Increased glucose. Type 1 diabetic. Acute kidney injury. EXAM: RENAL / URINARY TRACT ULTRASOUND COMPLETE COMPARISON:  None. FINDINGS: Right Kidney: Length: 12.6 cm. Echogenicity within normal limits. No mass or hydronephrosis visualized. Left Kidney: Length: 12.6 cm. Echogenicity within normal limits. No mass or hydronephrosis visualized. Bladder: Appears normal for  degree of bladder distention. IMPRESSION: Normal renal ultrasound. Electronically Signed   By: Franki Cabot M.D.   On: 08/24/2016 20:15   Dg Chest Port 1 View  Result Date: 08/24/2016 CLINICAL DATA:  31 year old male with vomiting. EXAM: PORTABLE CHEST 1 VIEW COMPARISON:  Chest radiograph dated 08/10/2016 FINDINGS: The heart size and mediastinal contours are within normal limits. Both lungs are clear. The visualized skeletal structures are unremarkable. IMPRESSION: No active disease. Electronically Signed   By: Anner Crete M.D.   On: 08/24/2016 01:56   Dg Abd 2 Views  Result Date: 08/25/2016 CLINICAL DATA:  Vomiting. EXAM: ABDOMEN - 2 VIEW COMPARISON:  CT 08/10/2016.  Abdomen 08/05/2016 . FINDINGS: Soft tissue structures the abdomen are unremarkable. No bowel distention. No free air. Surgical clips right abdomen. Degenerative changes lumbar spine and both hips . IMPRESSION: No acute abnormality. Electronically Signed   By: Marcello Moores  Register   On: 08/25/2016 15:51    Brent Navia, DO  [facility] Pager [phone]  If 7PM-7AM, please contact night-coverage [email or web address] Password [email or web address] 08/26/2016, 10:54 AM   LOS: 2 days

## 2016-08-27 DIAGNOSIS — N179 Acute kidney failure, unspecified: Secondary | ICD-10-CM

## 2016-08-27 DIAGNOSIS — N183 Chronic kidney disease, stage 3 (moderate): Secondary | ICD-10-CM

## 2016-08-27 DIAGNOSIS — G43A0 Cyclical vomiting, not intractable: Secondary | ICD-10-CM

## 2016-08-27 DIAGNOSIS — N189 Chronic kidney disease, unspecified: Secondary | ICD-10-CM

## 2016-08-27 DIAGNOSIS — E784 Other hyperlipidemia: Secondary | ICD-10-CM

## 2016-08-27 LAB — CBC
HEMATOCRIT: 28.3 % — AB (ref 39.0–52.0)
Hemoglobin: 9.1 g/dL — ABNORMAL LOW (ref 13.0–17.0)
MCH: 25.5 pg — ABNORMAL LOW (ref 26.0–34.0)
MCHC: 32.2 g/dL (ref 30.0–36.0)
MCV: 79.3 fL (ref 78.0–100.0)
Platelets: 386 10*3/uL (ref 150–400)
RBC: 3.57 MIL/uL — ABNORMAL LOW (ref 4.22–5.81)
RDW: 17.9 % — AB (ref 11.5–15.5)
WBC: 8.2 10*3/uL (ref 4.0–10.5)

## 2016-08-27 LAB — GLUCOSE, CAPILLARY
GLUCOSE-CAPILLARY: 280 mg/dL — AB (ref 65–99)
GLUCOSE-CAPILLARY: 128 mg/dL — AB (ref 65–99)

## 2016-08-27 LAB — BASIC METABOLIC PANEL
Anion gap: 7 (ref 5–15)
BUN: 24 mg/dL — AB (ref 6–20)
CHLORIDE: 96 mmol/L — AB (ref 101–111)
CO2: 30 mmol/L (ref 22–32)
Calcium: 8.9 mg/dL (ref 8.9–10.3)
Creatinine, Ser: 3.21 mg/dL — ABNORMAL HIGH (ref 0.61–1.24)
GFR calc Af Amer: 28 mL/min — ABNORMAL LOW (ref >60)
GFR calc non Af Amer: 24 mL/min — ABNORMAL LOW (ref >60)
GLUCOSE: 267 mg/dL — AB (ref 65–99)
POTASSIUM: 4.6 mmol/L (ref 3.5–5.1)
Sodium: 133 mmol/L — ABNORMAL LOW (ref 135–145)

## 2016-08-27 MED ORDER — METOPROLOL TARTRATE 50 MG PO TABS: 50.0000 mg | ORAL_TABLET | Freq: Two times a day (BID) | ORAL | Status: DC

## 2016-08-27 MED ORDER — METOPROLOL SUCCINATE ER 100 MG PO TB24: 100.0000 mg | ORAL_TABLET | Freq: Every day | ORAL | 0 refills | Status: DC

## 2016-08-27 MED ORDER — HYDROMORPHONE HCL 1 MG/ML IJ SOLN
1.0000 mg | Freq: Once | INTRAMUSCULAR | Status: AC
  Administered 2016-08-27: 1 mg via INTRAVENOUS
  Filled 2016-08-27: qty 1

## 2016-08-27 MED ORDER — METOPROLOL TARTRATE 25 MG PO TABS
25.0000 mg | ORAL_TABLET | Freq: Once | ORAL | Status: AC
  Administered 2016-08-27: 25 mg via ORAL
  Filled 2016-08-27: qty 1

## 2016-08-27 NOTE — Progress Notes (Signed)
Patient discharged to home. Walked down to lobby to wait for ride.  Ok'd by charge nurse. Discharge instruction given and verbalized understanding.

## 2016-08-27 NOTE — Discharge Summary (Signed)
Physician Discharge Summary  Brent Daniels RUE:[id number] DOB: [birth year] DOA: 08/23/2016  PCP: Brent Rim, MD  Admit date: 08/23/2016 Discharge date: 08/27/2016  Admitted From: Home Disposition:  Home   Recommendations for Outpatient Follow-up:  1. Follow up with PCP in 1-2 weeks 2. Please obtain BMP/CBC in one week   Discharge Condition: Stable CODE STATUS:FULL Diet recommendation: Heart Healthy / Carb Modified   Brief/Interim Summary: 31 y/o male with history of DM type 1, HTN, HLD, chronic pain syndrome with opioid dependence presented with 2 day hx of N/V and abdominal pain. Apparently, the patient was [name] by his mother, but this was rescinded by Dr. [name] in ED as the patient was appropriate without SI/HI. The patient states that he has been faithful with his insulin, but he endorses dietary indiscretions. He denies any fevers, chills, chest discomfort, short of breath, hematemesis, medication, melena. He states that he ran out of his Percocet approximately one week prior to admission. [facility] his [facility]. He denies any illegal drug use. Upon presentation, the patient was noted to have a serum glucose of 433 with anion gap of 22. In addition, the patient was noted to have a serumglucose 433 and serum creatinineof 4.64. He was started on IV fluids and intravenous insulin. Notably, the patient has had numerous hospitalizations in the past 2 months for intractable nausea and vomiting. The patient has been admitted to [facility] on 06/13/2016, 07/30/2016, 08/11/2016.  Discharge Diagnoses:  Hyperosmolar state -Patient presented with serum glucose of 433 with anion gap of 22 with bicarbonate of 41. -Continued IV fluids -Continued IV insulin until anion gap closes although gap may represent impaired renal re-absorption due to renal failure -7/21--transition to [place] insulin--start Lantus 10 units daily-->CBGs  controlled  Diabetes mellitus type 1, uncontrolled with hyperglycemia -Home regimen of Lantus 20 units daily - start novolog sliding sclae -Patient endorses dietary indiscretion  -pt also noncompliant although he denies--CBGs well controlled during the hospitalization  Acute on chronic renal failure--CKD3 -Patient apparently was discharged from [facility] on 08/11/16 with serum creatinine 2.7 -pt likely has progression of underlying CKD -renal US--no hydronephrosis -discontinue lisinopril -serum creatinine 4.64 at time of admission -7/22-serum creatinine 3.21-->pt refuses to stay another 24 hours for IVF--pt states he has appt with renal this week anyway  Diabetic gastroparesis/intractable nausea and vomiting -Multifactorial including hyperosmolar state, diabetic gastroparesis, opioids -12/23/2013 EGD--severe exudative esophagitis cannot rule out superimposed Candida infection -continue  PPI -still continues to vomit--likely due to acute medical condition exacerbating his functional gastroparesis -judicious metoclopramide due to prolonged QTc -2 view abd xray--negative -continue IVF -advanced to carb modified diet which he tolerated  Chronic abdominal pain -lipase 18 -pain is out of proportion with objective exam findings -I told pt I will not prescribe any opioids at time of discharge -follow up with his outpt pain clinic  Essential hypertension -Continue amlodipine -d/c lisinopril -increase metoprolol succinate to 100 mg daily  Hyperlipidemia -continue pravastatin  Prolonged QTc -repeat EKG--QTc = 510; personally reviewed -optimize electrolytes  Hypokalemia -replete -Mag 2.0    Discharge Instructions  Discharge Instructions    Diet Carb Modified    Complete by:  As directed    Increase activity slowly    Complete by:  As directed      Allergies as of 08/27/2016      Reactions   Hydrocodone Hives   Tramadol Anaphylaxis, Other (See Comments)    Acid Reflux   Ibuprofen Other (See  Comments)   Stomach ulcers   Naproxen Other (See Comments)   Stomach ulcers   Sulfa Antibiotics Other (See Comments)   Stomach ulcers   Morphine And Related Rash      Medication List    STOP taking these medications   insulin aspart protamine- aspart (70-30) 100 UNIT/ML injection Commonly known as:  NOVOLOG MIX 70/30   insulin NPH-regular Human (70-30) 100 UNIT/ML injection Commonly known as:  NOVOLIN 70/30   lisinopril 20 MG tablet Commonly known as:  PRINIVIL,ZESTRIL   oxyCODONE 5 MG immediate release tablet Commonly known as:  Oxy IR/ROXICODONE     TAKE these medications   amLODipine 10 MG tablet Commonly known as:  NORVASC Take 10 mg by mouth daily.   aspirin 81 MG tablet Take 81 mg by mouth daily.   BASAGLAR KWIKPEN 100 UNIT/ML Sopn Inject 20 Units into the skin daily.   gabapentin 300 MG capsule Commonly known as:  NEURONTIN Take 300 mg by mouth 2 (two) times daily. What changed:  Another medication with the same name was removed. Continue taking this medication, and follow the directions you see here.   insulin regular 100 units/mL injection Commonly known as:  NOVOLIN R,HUMULIN R Inject 2-10 Units into the skin 3 (three) times daily before meals. Sliding scale.   metoCLOPramide 10 MG tablet Commonly known as:  REGLAN 10 mg 4 (four) times daily -  before meals and at bedtime.   metoprolol succinate 100 MG 24 hr tablet Commonly known as:  TOPROL-XL Take 1 tablet (100 mg total) by mouth daily. What changed:  medication strength  how much to take   oxyCODONE-acetaminophen 10-325 MG tablet Commonly known as:  PERCOCET Take by mouth.   pantoprazole 40 MG tablet Commonly known as:  PROTONIX Take 1 tablet (40 mg total) by mouth 2 (two) times daily before a meal. What changed:  when to take this   pravastatin 20 MG tablet Commonly known as:  PRAVACHOL Take 20 mg by mouth daily.   ranitidine 150 MG  tablet Commonly known as:  ZANTAC Take 150 mg by mouth 2 (two) times daily.   tiZANidine 4 MG tablet Commonly known as:  ZANAFLEX 4 mg 2 (two) times daily.   Vitamin D (Ergocalciferol) 50000 units Caps capsule Commonly known as:  DRISDOL Take 50,000 Units by mouth every 7 (seven) days.       Allergies  Allergen Reactions  . Hydrocodone Hives  . Tramadol Anaphylaxis and Other (See Comments)    Acid Reflux  . Ibuprofen Other (See Comments)    Stomach ulcers  . Naproxen Other (See Comments)    Stomach ulcers  . Sulfa Antibiotics Other (See Comments)    Stomach ulcers  . Morphine And Related Rash    Consultations:  none   Procedures/Studies: US Renal  Result Date: 08/24/2016 CLINICAL DATA:  Increased glucose. Type 1 diabetic. Acute kidney injury. EXAM: RENAL / URINARY TRACT ULTRASOUND COMPLETE COMPARISON:  None. FINDINGS: Right Kidney: Length: 12.6 cm. Echogenicity within normal limits. No mass or hydronephrosis visualized. Left Kidney: Length: 12.6 cm. Echogenicity within normal limits. No mass or hydronephrosis visualized. Bladder: Appears normal for degree of bladder distention. IMPRESSION: Normal renal ultrasound. Electronically Signed   By: Franki Cabot M.D.   On: 08/24/2016 20:15   Dg Chest Port 1 View  Result Date: 08/24/2016 CLINICAL DATA:  31 year old male with vomiting. EXAM: PORTABLE CHEST 1 VIEW COMPARISON:  Chest radiograph dated 08/10/2016 FINDINGS: The heart size and mediastinal contours are  within normal limits. Both lungs are clear. The visualized skeletal structures are unremarkable. IMPRESSION: No active disease. Electronically Signed   By: Anner Crete M.D.   On: 08/24/2016 01:56   Dg Abd 2 Views  Result Date: 08/25/2016 CLINICAL DATA:  Vomiting. EXAM: ABDOMEN - 2 VIEW COMPARISON:  CT 08/10/2016.  Abdomen 08/05/2016 . FINDINGS: Soft tissue structures the abdomen are unremarkable. No bowel distention. No free air. Surgical clips right abdomen.  Degenerative changes lumbar spine and both hips . IMPRESSION: No acute abnormality. Electronically Signed   By: Marcello Moores  Register   On: 08/25/2016 15:51         Discharge Exam: Vitals:   08/27/16 0954 08/27/16 1105  BP:    Pulse: 84   Resp:    Temp:  97.9 F (36.6 C)   Vitals:   08/27/16 0800 08/27/16 0953 08/27/16 0954 08/27/16 1105  BP: (!) 166/112 (!) 177/109    Pulse:   84   Resp:      Temp:    97.9 F (36.6 C)  TempSrc:    Oral  SpO2:      Weight:      Height:        General: Pt is alert, awake, not in acute distress Cardiovascular: RRR, S1/S2 +, no rubs, no gallops Respiratory: CTA bilaterally, no wheezing, no rhonchi Abdominal: Soft, NT, ND, bowel sounds + Extremities: no edema, no cyanosis   The results of significant diagnostics from this hospitalization (including imaging, microbiology, ancillary and laboratory) are listed below for reference.    Significant Diagnostic Studies: US Renal  Result Date: 08/24/2016 CLINICAL DATA:  Increased glucose. Type 1 diabetic. Acute kidney injury. EXAM: RENAL / URINARY TRACT ULTRASOUND COMPLETE COMPARISON:  None. FINDINGS: Right Kidney: Length: 12.6 cm. Echogenicity within normal limits. No mass or hydronephrosis visualized. Left Kidney: Length: 12.6 cm. Echogenicity within normal limits. No mass or hydronephrosis visualized. Bladder: Appears normal for degree of bladder distention. IMPRESSION: Normal renal ultrasound. Electronically Signed   By: Franki Cabot M.D.   On: 08/24/2016 20:15   Dg Chest Port 1 View  Result Date: 08/24/2016 CLINICAL DATA:  31 year old male with vomiting. EXAM: PORTABLE CHEST 1 VIEW COMPARISON:  Chest radiograph dated 08/10/2016 FINDINGS: The heart size and mediastinal contours are within normal limits. Both lungs are clear. The visualized skeletal structures are unremarkable. IMPRESSION: No active disease. Electronically Signed   By: Anner Crete M.D.   On: 08/24/2016 01:56   Dg Abd 2  Views  Result Date: 08/25/2016 CLINICAL DATA:  Vomiting. EXAM: ABDOMEN - 2 VIEW COMPARISON:  CT 08/10/2016.  Abdomen 08/05/2016 . FINDINGS: Soft tissue structures the abdomen are unremarkable. No bowel distention. No free air. Surgical clips right abdomen. Degenerative changes lumbar spine and both hips . IMPRESSION: No acute abnormality. Electronically Signed   By: Marcello Moores  Register   On: 08/25/2016 15:51     Microbiology: Recent Results (from the past 240 hour(s))  MRSA PCR Screening     Status: None   Collection Time: 08/24/16  4:00 AM  Result Value Ref Range Status   MRSA by PCR NEGATIVE NEGATIVE Final    Comment:        The GeneXpert MRSA Assay (FDA approved for NASAL specimens only), is one component of a comprehensive MRSA colonization surveillance program. It is not intended to diagnose MRSA infection nor to guide or monitor treatment for MRSA infections.      Labs: Basic Metabolic Panel:  Recent Labs Lab 08/24/16 0050  08/24/16  1352  08/25/16 2118 08/26/16 0201 08/26/16 0612 08/26/16 0911 08/27/16 0556  NA 131*  < > 131*  < > 132* 135 134* 136 133*  K 3.4*  < > 2.7*  < > 3.1* 3.1* 3.2* 3.0* 4.6  CL 68*  < > 74*  < > 84* 89* 88* 89* 96*  CO2 41*  < > 37*  < > 36* 34* 34* 37* 30  GLUCOSE 433*  < > 170*  < > 151* 146* 199* 132* 267*  BUN 62*  < > 52*  < > 31* 30* 29* 28* 24*  CREATININE 4.64*  < > 4.09*  < > 3.54* 3.70* 3.56* 3.62* 3.21*  CALCIUM 6.9*  < > 7.2*  < > 8.6* 9.0 9.0 8.9 8.9  MG 1.8  --  2.0  --   --   --   --   --   --   PHOS 8.0*  --   --   --   --   --   --   --   --   < > = values in this interval not displayed. Liver Function Tests:  Recent Labs Lab 08/24/16 0050  AST 17  ALT 13*  ALKPHOS 117  BILITOT 1.4*  PROT 6.9  ALBUMIN 3.7    Recent Labs Lab 08/24/16 0050  LIPASE 18   No results for input(s): AMMONIA in the last 168 hours. CBC:  Recent Labs Lab 08/24/16 0050 08/26/16 0612 08/27/16 0556  WBC 24.8* 8.0 8.2  NEUTROABS  20.7*  --   --   HGB 9.3* 9.5* 9.1*  HCT 26.9* 29.1* 28.3*  MCV 74.3* 78.4 79.3  PLT 519* 445* 386   Cardiac Enzymes: No results for input(s): CKTOTAL, CKMB, CKMBINDEX, TROPONINI in the last 168 hours. BNP: Invalid input(s): POCBNP CBG:  Recent Labs Lab 08/26/16 1128 08/26/16 1614 08/26/16 2108 08/27/16 0753 08/27/16 1104  GLUCAP 133* 151* 102* 280* 128*    Time coordinating discharge:  Greater than 30 minutes  Signed:  Dena Esperanza, DO [facility] Pager: [phone] 08/27/2016, 11:56 AM

## 2016-09-13 ENCOUNTER — Ambulatory Visit: Payer: Self-pay | Admitting: "Endocrinology

## 2016-11-16 ENCOUNTER — Encounter (HOSPITAL_COMMUNITY): Payer: Self-pay | Admitting: Emergency Medicine

## 2016-11-16 ENCOUNTER — Inpatient Hospital Stay (HOSPITAL_COMMUNITY): Payer: Managed Care, Other (non HMO)

## 2016-11-16 ENCOUNTER — Inpatient Hospital Stay (HOSPITAL_COMMUNITY)
Admission: EM | Admit: 2016-11-16 | Discharge: 2016-11-19 | DRG: 683 | Disposition: A | Discharge status: Home / Self Care | Payer: Managed Care, Other (non HMO) | Attending: Family Medicine | Admitting: Family Medicine

## 2016-11-16 ENCOUNTER — Emergency Department (HOSPITAL_COMMUNITY): Payer: Managed Care, Other (non HMO)

## 2016-11-16 DIAGNOSIS — I1311 Hypertensive heart and chronic kidney disease without heart failure, with stage 5 chronic kidney disease, or end stage renal disease: Secondary | ICD-10-CM | POA: Diagnosis present

## 2016-11-16 DIAGNOSIS — D72829 Elevated white blood cell count, unspecified: Secondary | ICD-10-CM | POA: Diagnosis present

## 2016-11-16 DIAGNOSIS — R112 Nausea with vomiting, unspecified: Secondary | ICD-10-CM | POA: Diagnosis present

## 2016-11-16 DIAGNOSIS — E873 Alkalosis: Secondary | ICD-10-CM | POA: Diagnosis present

## 2016-11-16 DIAGNOSIS — D631 Anemia in chronic kidney disease: Secondary | ICD-10-CM | POA: Diagnosis present

## 2016-11-16 DIAGNOSIS — N183 Chronic kidney disease, stage 3 (moderate): Secondary | ICD-10-CM | POA: Diagnosis present

## 2016-11-16 DIAGNOSIS — Z765 Malingerer [conscious simulation]: Secondary | ICD-10-CM | POA: Diagnosis not present

## 2016-11-16 DIAGNOSIS — E1065 Type 1 diabetes mellitus with hyperglycemia: Secondary | ICD-10-CM | POA: Diagnosis present

## 2016-11-16 DIAGNOSIS — F17210 Nicotine dependence, cigarettes, uncomplicated: Secondary | ICD-10-CM | POA: Diagnosis present

## 2016-11-16 DIAGNOSIS — E1022 Type 1 diabetes mellitus with diabetic chronic kidney disease: Secondary | ICD-10-CM | POA: Diagnosis present

## 2016-11-16 DIAGNOSIS — E1043 Type 1 diabetes mellitus with diabetic autonomic (poly)neuropathy: Secondary | ICD-10-CM | POA: Diagnosis present

## 2016-11-16 DIAGNOSIS — F1910 Other psychoactive substance abuse, uncomplicated: Secondary | ICD-10-CM | POA: Diagnosis present

## 2016-11-16 DIAGNOSIS — K279 Peptic ulcer, site unspecified, unspecified as acute or chronic, without hemorrhage or perforation: Secondary | ICD-10-CM | POA: Diagnosis present

## 2016-11-16 DIAGNOSIS — Q231 Congenital insufficiency of aortic valve: Secondary | ICD-10-CM | POA: Diagnosis not present

## 2016-11-16 DIAGNOSIS — N170 Acute kidney failure with tubular necrosis: Principal | ICD-10-CM | POA: Diagnosis present

## 2016-11-16 DIAGNOSIS — K210 Gastro-esophageal reflux disease with esophagitis: Secondary | ICD-10-CM | POA: Diagnosis present

## 2016-11-16 DIAGNOSIS — E785 Hyperlipidemia, unspecified: Secondary | ICD-10-CM | POA: Diagnosis present

## 2016-11-16 DIAGNOSIS — E559 Vitamin D deficiency, unspecified: Secondary | ICD-10-CM | POA: Diagnosis present

## 2016-11-16 DIAGNOSIS — E861 Hypovolemia: Secondary | ICD-10-CM | POA: Diagnosis present

## 2016-11-16 DIAGNOSIS — R1084 Generalized abdominal pain: Secondary | ICD-10-CM

## 2016-11-16 DIAGNOSIS — I10 Essential (primary) hypertension: Secondary | ICD-10-CM | POA: Diagnosis present

## 2016-11-16 DIAGNOSIS — G8929 Other chronic pain: Secondary | ICD-10-CM | POA: Diagnosis present

## 2016-11-16 DIAGNOSIS — Z9119 Patient's noncompliance with other medical treatment and regimen: Secondary | ICD-10-CM

## 2016-11-16 DIAGNOSIS — E871 Hypo-osmolality and hyponatremia: Secondary | ICD-10-CM | POA: Diagnosis present

## 2016-11-16 DIAGNOSIS — Z885 Allergy status to narcotic agent status: Secondary | ICD-10-CM

## 2016-11-16 DIAGNOSIS — G629 Polyneuropathy, unspecified: Secondary | ICD-10-CM

## 2016-11-16 DIAGNOSIS — Z720 Tobacco use: Secondary | ICD-10-CM | POA: Diagnosis not present

## 2016-11-16 DIAGNOSIS — K3184 Gastroparesis: Secondary | ICD-10-CM | POA: Diagnosis present

## 2016-11-16 DIAGNOSIS — N185 Chronic kidney disease, stage 5: Secondary | ICD-10-CM | POA: Diagnosis present

## 2016-11-16 DIAGNOSIS — Z8711 Personal history of peptic ulcer disease: Secondary | ICD-10-CM

## 2016-11-16 DIAGNOSIS — Z882 Allergy status to sulfonamides status: Secondary | ICD-10-CM

## 2016-11-16 DIAGNOSIS — E876 Hypokalemia: Secondary | ICD-10-CM | POA: Diagnosis present

## 2016-11-16 DIAGNOSIS — Z886 Allergy status to analgesic agent status: Secondary | ICD-10-CM

## 2016-11-16 DIAGNOSIS — N19 Unspecified kidney failure: Secondary | ICD-10-CM | POA: Diagnosis not present

## 2016-11-16 DIAGNOSIS — Z7982 Long term (current) use of aspirin: Secondary | ICD-10-CM

## 2016-11-16 DIAGNOSIS — R739 Hyperglycemia, unspecified: Secondary | ICD-10-CM | POA: Diagnosis present

## 2016-11-16 DIAGNOSIS — K2210 Ulcer of esophagus without bleeding: Secondary | ICD-10-CM | POA: Diagnosis present

## 2016-11-16 DIAGNOSIS — Z79899 Other long term (current) drug therapy: Secondary | ICD-10-CM

## 2016-11-16 DIAGNOSIS — E860 Dehydration: Secondary | ICD-10-CM | POA: Diagnosis present

## 2016-11-16 DIAGNOSIS — N179 Acute kidney failure, unspecified: Secondary | ICD-10-CM | POA: Diagnosis not present

## 2016-11-16 DIAGNOSIS — Z794 Long term (current) use of insulin: Secondary | ICD-10-CM | POA: Diagnosis not present

## 2016-11-16 DIAGNOSIS — K219 Gastro-esophageal reflux disease without esophagitis: Secondary | ICD-10-CM | POA: Diagnosis present

## 2016-11-16 LAB — HEMOGLOBIN A1C
Hgb A1c MFr Bld: 7.6 % — ABNORMAL HIGH (ref 4.8–5.6)
Mean Plasma Glucose: 171.42 mg/dL

## 2016-11-16 LAB — IRON AND TIBC
Iron: 35 ug/dL — ABNORMAL LOW (ref 45–182)
Saturation Ratios: 8 % — ABNORMAL LOW (ref 17.9–39.5)
TIBC: 449 ug/dL (ref 250–450)
UIBC: 414 ug/dL

## 2016-11-16 LAB — CBG MONITORING, ED
GLUCOSE-CAPILLARY: 307 mg/dL — AB (ref 65–99)
Glucose-Capillary: >600 mg/dL (ref 65–99)
Glucose-Capillary: >600 mg/dL (ref 65–99)
Glucose-Capillary: 480 mg/dL — ABNORMAL HIGH (ref 65–99)

## 2016-11-16 LAB — CBC
HEMATOCRIT: 35.9 % — AB (ref 39.0–52.0)
HEMOGLOBIN: 12.1 g/dL — AB (ref 13.0–17.0)
MCH: 28.9 pg (ref 26.0–34.0)
MCHC: 33.7 g/dL (ref 30.0–36.0)
MCV: 85.7 fL (ref 78.0–100.0)
PLATELETS: 526 10*3/uL — AB (ref 150–400)
RBC: 4.19 MIL/uL — AB (ref 4.22–5.81)
RDW: 15.5 % (ref 11.5–15.5)
WBC: 19.1 10*3/uL — AB (ref 4.0–10.5)

## 2016-11-16 LAB — COMPREHENSIVE METABOLIC PANEL
ALT: 8 U/L — ABNORMAL LOW (ref 17–63)
AST: 16 U/L (ref 15–41)
Albumin: 4.9 g/dL (ref 3.5–5.0)
Alkaline Phosphatase: 147 U/L — ABNORMAL HIGH (ref 38–126)
BUN: 57 mg/dL — AB (ref 6–20)
CHLORIDE: 77 mmol/L — AB (ref 101–111)
CO2: 28 mmol/L (ref 22–32)
Calcium: 10.4 mg/dL — ABNORMAL HIGH (ref 8.9–10.3)
Creatinine, Ser: 6.37 mg/dL — ABNORMAL HIGH (ref 0.61–1.24)
GFR calc Af Amer: 12 mL/min — ABNORMAL LOW (ref >60)
GFR calc non Af Amer: 10 mL/min — ABNORMAL LOW (ref >60)
GLUCOSE: 293 mg/dL — AB (ref 65–99)
POTASSIUM: 3.4 mmol/L — AB (ref 3.5–5.1)
SODIUM: 134 mmol/L — AB (ref 135–145)
Total Bilirubin: 1.9 mg/dL — ABNORMAL HIGH (ref 0.3–1.2)
Total Protein: 9.4 g/dL — ABNORMAL HIGH (ref 6.5–8.1)

## 2016-11-16 LAB — RAPID URINE DRUG SCREEN, HOSP PERFORMED
AMPHETAMINES: NOT DETECTED
BARBITURATES: NOT DETECTED
BENZODIAZEPINES: NOT DETECTED
Cocaine: NOT DETECTED
Opiates: NOT DETECTED
TETRAHYDROCANNABINOL: NOT DETECTED

## 2016-11-16 LAB — URINALYSIS, ROUTINE W REFLEX MICROSCOPIC
Bacteria, UA: NONE SEEN
Bilirubin Urine: NEGATIVE
Glucose, UA: >=500 mg/dL — AB
Ketones, ur: 80 mg/dL — AB
Leukocytes, UA: NEGATIVE
Nitrite: NEGATIVE
Protein, ur: >=300 mg/dL — AB
Specific Gravity, Urine: 1.016 (ref 1.005–1.030)
pH: 5 (ref 5.0–8.0)

## 2016-11-16 LAB — FERRITIN: Ferritin: 76 ng/mL (ref 24–336)

## 2016-11-16 LAB — BASIC METABOLIC PANEL
Anion gap: 35 — ABNORMAL HIGH (ref 5–15)
BUN: 57 mg/dL — AB (ref 6–20)
CALCIUM: 9.3 mg/dL (ref 8.9–10.3)
CO2: 21 mmol/L — AB (ref 22–32)
CREATININE: 6.58 mg/dL — AB (ref 0.61–1.24)
Chloride: 74 mmol/L — ABNORMAL LOW (ref 101–111)
GFR calc non Af Amer: 10 mL/min — ABNORMAL LOW (ref >60)
GFR, EST AFRICAN AMERICAN: 12 mL/min — AB (ref >60)
Glucose, Bld: 748 mg/dL (ref 65–99)
Potassium: 3.8 mmol/L (ref 3.5–5.1)
SODIUM: 130 mmol/L — AB (ref 135–145)

## 2016-11-16 LAB — ACETAMINOPHEN LEVEL: Acetaminophen (Tylenol), Serum: <10 ug/mL — ABNORMAL LOW (ref 10–30)

## 2016-11-16 LAB — LACTIC ACID, PLASMA: Lactic Acid, Venous: 2 mmol/L (ref 0.5–1.9)

## 2016-11-16 LAB — ETHANOL: Alcohol, Ethyl (B): <10 mg/dL (ref <10)

## 2016-11-16 LAB — FOLATE: Folate: 30 ng/mL (ref >5.9)

## 2016-11-16 LAB — PHOSPHORUS: Phosphorus: 3.7 mg/dL (ref 2.5–4.6)

## 2016-11-16 LAB — RETICULOCYTES
RBC.: 3.79 MIL/uL — ABNORMAL LOW (ref 4.22–5.81)
Retic Count, Absolute: 22.7 10*3/uL (ref 19.0–186.0)
Retic Ct Pct: 0.6 % (ref 0.4–3.1)

## 2016-11-16 LAB — SALICYLATE LEVEL: Salicylate Lvl: <7 mg/dL (ref 2.8–30.0)

## 2016-11-16 LAB — VITAMIN B12: Vitamin B-12: 510 pg/mL (ref 180–914)

## 2016-11-16 LAB — TSH: TSH: 0.191 u[IU]/mL — ABNORMAL LOW (ref 0.350–4.500)

## 2016-11-16 LAB — T4, FREE: FREE T4: 1.6 ng/dL — AB (ref 0.61–1.12)

## 2016-11-16 MED ORDER — GI COCKTAIL ~~LOC~~
30.0000 mL | Freq: Once | ORAL | Status: AC
  Administered 2016-11-16: 30 mL via ORAL
  Filled 2016-11-16: qty 30

## 2016-11-16 MED ORDER — INSULIN ASPART 100 UNIT/ML ~~LOC~~ SOLN
0.0000 [IU] | Freq: Three times a day (TID) | SUBCUTANEOUS | Status: DC
  Administered 2016-11-17: 3 [IU] via SUBCUTANEOUS
  Administered 2016-11-17: 9 [IU] via SUBCUTANEOUS
  Administered 2016-11-17: 7 [IU] via SUBCUTANEOUS
  Administered 2016-11-18: 3 [IU] via SUBCUTANEOUS
  Administered 2016-11-18 (×2): 2 [IU] via SUBCUTANEOUS
  Administered 2016-11-19: 7 [IU] via SUBCUTANEOUS
  Filled 2016-11-16: qty 1

## 2016-11-16 MED ORDER — LORAZEPAM 2 MG/ML IJ SOLN
1.0000 mg | Freq: Once | INTRAMUSCULAR | Status: AC
  Administered 2016-11-16: 1 mg via INTRAVENOUS
  Filled 2016-11-16: qty 1

## 2016-11-16 MED ORDER — INSULIN ASPART 100 UNIT/ML ~~LOC~~ SOLN
5.0000 [IU] | Freq: Three times a day (TID) | SUBCUTANEOUS | Status: DC
  Administered 2016-11-17: 5 [IU] via SUBCUTANEOUS

## 2016-11-16 MED ORDER — ONDANSETRON HCL 4 MG/2ML IJ SOLN
INTRAMUSCULAR | Status: AC
  Filled 2016-11-16: qty 2

## 2016-11-16 MED ORDER — INSULIN ASPART 100 UNIT/ML ~~LOC~~ SOLN
15.0000 [IU] | Freq: Once | SUBCUTANEOUS | Status: AC
  Administered 2016-11-16: 15 [IU] via SUBCUTANEOUS

## 2016-11-16 MED ORDER — INSULIN ASPART 100 UNIT/ML ~~LOC~~ SOLN
20.0000 [IU] | Freq: Once | SUBCUTANEOUS | Status: AC
  Administered 2016-11-16: 20 [IU] via SUBCUTANEOUS
  Filled 2016-11-16: qty 1

## 2016-11-16 MED ORDER — INSULIN ASPART 100 UNIT/ML IV SOLN
10.0000 [IU] | Freq: Once | INTRAVENOUS | Status: AC
  Administered 2016-11-16: 10 [IU] via INTRAVENOUS

## 2016-11-16 MED ORDER — HEPARIN SODIUM (PORCINE) 5000 UNIT/ML IJ SOLN
5000.0000 [IU] | Freq: Three times a day (TID) | INTRAMUSCULAR | Status: DC
  Administered 2016-11-17 – 2016-11-18 (×2): 5000 [IU] via SUBCUTANEOUS
  Filled 2016-11-16 (×3): qty 1

## 2016-11-16 MED ORDER — ASPIRIN EC 81 MG PO TBEC
81.0000 mg | DELAYED_RELEASE_TABLET | Freq: Every day | ORAL | Status: DC
  Administered 2016-11-17 – 2016-11-19 (×2): 81 mg via ORAL
  Filled 2016-11-16 (×2): qty 1

## 2016-11-16 MED ORDER — LORAZEPAM 2 MG/ML IJ SOLN
0.5000 mg | INTRAMUSCULAR | Status: DC | PRN
  Administered 2016-11-16 – 2016-11-17 (×2): 0.5 mg via INTRAVENOUS
  Filled 2016-11-16 (×3): qty 1

## 2016-11-16 MED ORDER — ONDANSETRON HCL 4 MG PO TABS
4.0000 mg | ORAL_TABLET | Freq: Four times a day (QID) | ORAL | Status: DC | PRN
  Administered 2016-11-17: 4 mg via ORAL
  Filled 2016-11-16: qty 1

## 2016-11-16 MED ORDER — INSULIN GLARGINE 100 UNIT/ML ~~LOC~~ SOLN
20.0000 [IU] | Freq: Every day | SUBCUTANEOUS | Status: DC
  Administered 2016-11-16 – 2016-11-19 (×4): 20 [IU] via SUBCUTANEOUS
  Filled 2016-11-16 (×5): qty 0.2

## 2016-11-16 MED ORDER — INSULIN ASPART 100 UNIT/ML ~~LOC~~ SOLN
SUBCUTANEOUS | Status: AC
  Filled 2016-11-16: qty 1

## 2016-11-16 MED ORDER — PROCHLORPERAZINE EDISYLATE 5 MG/ML IJ SOLN
10.0000 mg | Freq: Once | INTRAMUSCULAR | Status: AC
  Administered 2016-11-16: 10 mg via INTRAVENOUS
  Filled 2016-11-16: qty 2

## 2016-11-16 MED ORDER — PANTOPRAZOLE SODIUM 40 MG IV SOLR
40.0000 mg | INTRAVENOUS | Status: DC
  Administered 2016-11-16: 40 mg via INTRAVENOUS
  Filled 2016-11-16: qty 40

## 2016-11-16 MED ORDER — ACETAMINOPHEN 325 MG PO TABS: 650.0000 mg | ORAL_TABLET | Freq: Four times a day (QID) | ORAL | Status: DC | PRN

## 2016-11-16 MED ORDER — POLYETHYLENE GLYCOL 3350 17 G PO PACK: 17.0000 g | PACK | Freq: Every day | ORAL | Status: DC | PRN

## 2016-11-16 MED ORDER — HYDRALAZINE HCL 20 MG/ML IJ SOLN: 10.0000 mg | INTRAMUSCULAR | Status: DC | PRN

## 2016-11-16 MED ORDER — NICOTINE 7 MG/24HR TD PT24
7.0000 mg | MEDICATED_PATCH | Freq: Every day | TRANSDERMAL | Status: DC
  Filled 2016-11-16 (×4): qty 1

## 2016-11-16 MED ORDER — SODIUM CHLORIDE 0.9 % IV SOLN
1000.0000 mL | INTRAVENOUS | Status: DC
  Administered 2016-11-16: 1000 mL via INTRAVENOUS

## 2016-11-16 MED ORDER — METOCLOPRAMIDE HCL 10 MG PO TABS
10.0000 mg | ORAL_TABLET | Freq: Once | ORAL | Status: AC
  Administered 2016-11-16: 10 mg via ORAL
  Filled 2016-11-16: qty 1

## 2016-11-16 MED ORDER — ONDANSETRON HCL 4 MG/2ML IJ SOLN
4.0000 mg | Freq: Four times a day (QID) | INTRAMUSCULAR | Status: DC | PRN
Start: 2016-11-16 — End: 2016-11-17
  Administered 2016-11-16 – 2016-11-17 (×2): 4 mg via INTRAVENOUS
  Filled 2016-11-16: qty 2

## 2016-11-16 MED ORDER — SODIUM CHLORIDE 0.9 % IV BOLUS (SEPSIS)
750.0000 mL | Freq: Once | INTRAVENOUS | Status: AC
  Administered 2016-11-16: 750 mL via INTRAVENOUS

## 2016-11-16 MED ORDER — METOPROLOL SUCCINATE ER 50 MG PO TB24
100.0000 mg | ORAL_TABLET | Freq: Every day | ORAL | Status: DC
  Administered 2016-11-16 – 2016-11-19 (×3): 100 mg via ORAL
  Filled 2016-11-16 (×4): qty 2

## 2016-11-16 MED ORDER — DOCUSATE SODIUM 100 MG PO CAPS
100.0000 mg | ORAL_CAPSULE | Freq: Two times a day (BID) | ORAL | Status: DC
  Filled 2016-11-16 (×4): qty 1

## 2016-11-16 MED ORDER — ACETAMINOPHEN 650 MG RE SUPP: 650.0000 mg | Freq: Four times a day (QID) | RECTAL | Status: DC | PRN

## 2016-11-16 MED ORDER — VITAMIN D (ERGOCALCIFEROL) 1.25 MG (50000 UNIT) PO CAPS
50000.0000 [IU] | ORAL_CAPSULE | ORAL | Status: DC
  Filled 2016-11-16 (×3): qty 1

## 2016-11-16 MED ORDER — ONDANSETRON HCL 4 MG/2ML IJ SOLN
4.0000 mg | Freq: Once | INTRAMUSCULAR | Status: AC
  Administered 2016-11-16: 4 mg via INTRAVENOUS
  Filled 2016-11-16: qty 2

## 2016-11-16 MED ORDER — PROMETHAZINE HCL 25 MG/ML IJ SOLN
12.5000 mg | Freq: Three times a day (TID) | INTRAMUSCULAR | Status: AC | PRN
  Administered 2016-11-17 – 2016-11-18 (×3): 12.5 mg via INTRAVENOUS
  Filled 2016-11-16 (×3): qty 1

## 2016-11-16 MED ORDER — SODIUM CHLORIDE 0.9 % IV BOLUS (SEPSIS)
1000.0000 mL | Freq: Once | INTRAVENOUS | Status: AC
  Administered 2016-11-16: 1000 mL via INTRAVENOUS

## 2016-11-16 MED ORDER — METOCLOPRAMIDE HCL 5 MG/ML IJ SOLN
5.0000 mg | Freq: Three times a day (TID) | INTRAMUSCULAR | Status: AC
  Administered 2016-11-17 (×3): 5 mg via INTRAVENOUS
  Filled 2016-11-16 (×3): qty 2

## 2016-11-16 MED ORDER — METOCLOPRAMIDE HCL 10 MG PO TABS: 10.0000 mg | ORAL_TABLET | Freq: Three times a day (TID) | ORAL | Status: DC

## 2016-11-16 MED ORDER — AMLODIPINE BESYLATE 5 MG PO TABS
10.0000 mg | ORAL_TABLET | Freq: Every day | ORAL | Status: DC
  Administered 2016-11-17 – 2016-11-19 (×2): 10 mg via ORAL
  Filled 2016-11-16 (×2): qty 2

## 2016-11-16 MED ORDER — BISACODYL 5 MG PO TBEC: 5.0000 mg | DELAYED_RELEASE_TABLET | Freq: Every day | ORAL | Status: DC | PRN

## 2016-11-16 NOTE — ED Notes (Signed)
Hospitalist at bedside 

## 2016-11-16 NOTE — Progress Notes (Signed)
11/16/2016 7:29 PM  Will check stat BMP to be sure patient is not going into DKA.  If BS not coming down, recommend starting Glucostabilizer and transfer to stepdown unit.    Murvin Natal MD

## 2016-11-16 NOTE — ED Notes (Signed)
Pt continues to have intermittent vomiting with watery brown vomitus. PA made aware

## 2016-11-16 NOTE — ED Triage Notes (Signed)
Pt brought to ED by EMS. [employer] present. Pt mother took out IVC ppwrk for SI stating pt has stated he "does not care if he lives or dies" and "has been abusing prescription drugs". Pt reports n/v and generalized pain x 3 days. Pt denies SI/HI/SA.

## 2016-11-16 NOTE — ED Notes (Signed)
Hospitalist paged due to [facility] registering HI

## 2016-11-16 NOTE — ED Notes (Signed)
Pt conts to remove IV line and walk to sink to get water

## 2016-11-16 NOTE — ED Notes (Signed)
MD Lorin Mercy notified of glucose of 748, no new orders at this time.

## 2016-11-16 NOTE — ED Notes (Signed)
Belongings placed in EMS locker room

## 2016-11-16 NOTE — ED Provider Notes (Signed)
[facility] DEPT Provider Note   CSN: [id number] Arrival date & time: 11/16/16  0818     History   Chief Complaint Chief Complaint  Patient presents with  . Medical Clearance    HPI KIEL COCKERELL is a 31 y.o. male.  Patient is a 31 year old male who presents to the emergency department in the custody of the sheriff department because of involuntary commitment.  The patient states that he is having vomiting, and has been vomiting over the last 3 days. He is having pain all over. He has not checked his blood sugars recently he has a history of diabetes mellitus. He states that he did not want to come to the hospital, his mom wanted him to come to the hospital and so she took out IVC papers on him.  Patient has a history of kidney disease, type 1 diabetes mellitus, chronic abdominal pain and back pain. He is currently on high-dose narcotics. He has a history of hypertension as well as neuropathies and gastroparesis . The patient states that he is not suicidal and does not have a suicidal plan. He is not homicidal and does not have homicidal plan.   The history is provided by the patient.    Past Medical History:  Diagnosis Date  . Acute esophagitis   . Anxiety   . Arthritis   . Chronic abdominal pain   . Chronic back pain   . Diabetes mellitus    type 1  . Diabetic gastroparesis associated with type 1 diabetes mellitus ([place])   . Erosive esophagitis 12/23/2013   "severe" per EGD   . Gastroparesis   . GERD (gastroesophageal reflux disease)   . Heart murmur    valvular stenosis  . Hypercholesteremia   . Hypertension   . Insomnia   . Nausea and vomiting    chronic, recurrent  . Neuropathy   . PUD (peptic ulcer disease)   . S/P arthroscopic knee surgery 07/04/2011  . Scoliosis 07/11/2012    Patient Active Problem List   Diagnosis Date Noted  . Acute renal failure superimposed on stage 3 chronic kidney disease ([place]) 08/27/2016  . Intractable vomiting   . Volume  depletion 08/24/2016  . Narcotic withdrawal ([place]) 08/24/2016  . Hypochloremic alkalosis 08/24/2016  . Diabetic hyperosmolar non-ketotic state ([place]) 08/24/2016  . Chronic abdominal pain 08/24/2016  . Diabetic gastroparesis ([place]) 08/24/2016  . Uncontrolled type 1 diabetes mellitus with hyperglycemia, with long-term current use of insulin ([place]) 08/24/2016  . Dehydration   . Reflux esophagitis 05/14/2015  . Nausea & vomiting 11/13/2014  . AKI (acute kidney injury) ([place]) 11/13/2014  . Hyperglycemia due to type 1 diabetes mellitus ([place]) 04/24/2014  . RUQ abdominal pain 04/14/2014  . DKA (diabetic ketoacidoses) ([place]) 04/03/2014  . Gastroparesis 03/30/2014  . Drug abuse ([place]) 03/30/2014  . Tobacco use 03/30/2014  . HLD (hyperlipidemia) 03/30/2014  . Oropharyngeal candidiasis   . Metabolic acidosis   . Abdominal fluid collection   . Abdominal pain, acute   . Esophagitis 01/03/2014  . Nausea with vomiting   . Acute esophagitis   . Hyperglycemia 12/20/2013  . Abdominal pain 12/09/2013  . PUD (peptic ulcer disease) 10/29/2013  . Leukocytosis 09/29/2013  . Sinus tachycardia 09/29/2013  . Intractable nausea and vomiting 09/28/2013  . Chronic generalized abdominal pain 09/28/2013  . Diabetic gastroparesis associated with type 1 diabetes mellitus ([place]) 09/28/2013  . Hematemesis 09/28/2013  . Diabetic neuropathy ([place]) 09/28/2013  . Abdominal pain, epigastric 08/19/2013  . Uncontrolled diabetes mellitus (  [place]) 06/15/2013  . GERD (gastroesophageal reflux disease) 06/15/2013  . DKA, type 1 ([place]) 06/15/2013  . Essential hypertension 07/11/2012  . DM (diabetes mellitus) ([place]) 07/11/2012  . Scoliosis 07/11/2012  . Back pain 07/11/2012  . S/P arthroscopic knee surgery 07/04/2011    Past Surgical History:  Procedure Laterality Date  . BIOPSY  06/17/2013   Procedure: GASTRIC ULCER AND ANTRAL BIOPSIES; ESOPHAGEAL BRUSHING;  Surgeon: Danie Binder, MD;  Location: [facility];  Service: Endoscopy;;  .  BIOPSY N/A 12/23/2013   Procedure: ESOPHAGEAL BIOPSY;  Surgeon: Daneil Dolin, MD;  Location: [facility];  Service: Endoscopy;  Laterality: N/A;  . CHOLECYSTECTOMY N/A 01/28/2014   Procedure: LAPAROSCOPIC CHOLECYSTECTOMY;  Surgeon: Jamesetta So, MD;  Location: [facility];  Service: General;  Laterality: N/A;  . ESOPHAGOGASTRODUODENOSCOPY (EGD) WITH PROPOFOL  06/17/2013   Dr. Oneida Alar: two gastric ulcers in fundus, mild antral gastritis, candida esophagitis  . ESOPHAGOGASTRODUODENOSCOPY (EGD) WITH PROPOFOL N/A 09/11/2013   Dr. [name]: abnormal hypopharynx, question massively enlarged tonsils, stomach full of food precluded exam, needs EGD for verification of ulcer healing at a later date  . ESOPHAGOGASTRODUODENOSCOPY (EGD) WITH PROPOFOL N/A 12/23/2013   RMR: Severe exudative esophagitis likely predominatly reflux related. Superimposed Candida infection not excluded status post KOH brushing and biopsy. Localized excoriating gastric mucosa most consistant with trauma(vomiting). No evidence of peptic ulcer disease or other gastric/duodenal pathology. I suspect severe inflammation involving the distal esophagus may account  for at least some of patii  . KNEE ARTHROSCOPY  06/30/2011   Procedure: ARTHROSCOPY KNEE;  Surgeon: Carole Civil, MD;  Location: [facility];  Service: Orthopedics;  Laterality: Right;  diagnostic arthroscopy  . TYMPANOSTOMY TUBE PLACEMENT         Home Medications    Prior to Admission medications   Medication Sig Start Date End Date Taking? Authorizing Provider  amLODipine (NORVASC) 10 MG tablet Take 10 mg by mouth daily.  05/08/15   [provider]  aspirin 81 MG tablet Take 81 mg by mouth daily.    [provider]  gabapentin (NEURONTIN) 300 MG capsule Take 300 mg by mouth 2 (two) times daily.     [provider]  Insulin Glargine (BASAGLAR KWIKPEN) 100 UNIT/ML SOPN Inject 20 Units into the skin daily. 05/19/16   [provider]  insulin regular  (NOVOLIN R,HUMULIN R) 100 units/mL injection Inject 2-10 Units into the skin 3 (three) times daily before meals. Sliding scale.    [provider]  metoCLOPramide (REGLAN) 10 MG tablet 10 mg 4 (four) times daily -  before meals and at bedtime. 04/26/15   [provider]  metoprolol succinate (TOPROL-XL) 100 MG 24 hr tablet Take 1 tablet (100 mg total) by mouth daily. 08/27/16   Orson Eva, MD  oxyCODONE-acetaminophen (PERCOCET) 10-325 MG tablet Take by mouth. 05/20/15   [provider]  pantoprazole (PROTONIX) 40 MG tablet Take 1 tablet (40 mg total) by mouth 2 (two) times daily before a meal. Patient taking differently: Take 40 mg by mouth daily.  04/10/14   Annitta Needs, NP  pravastatin (PRAVACHOL) 20 MG tablet Take 20 mg by mouth daily.    [provider]  ranitidine (ZANTAC) 150 MG tablet Take 150 mg by mouth 2 (two) times daily.    [provider]  tiZANidine (ZANAFLEX) 4 MG tablet 4 mg 2 (two) times daily.  05/11/15   [provider]  Vitamin D, Ergocalciferol, (DRISDOL) 50000 units CAPS capsule Take 50,000 Units  by mouth every 7 (seven) days.    [provider]    Family History Family History  Problem Relation Age of Onset  . Cancer Father   . Alcohol abuse Father   . Pseudochol deficiency Neg Hx   . Malignant hyperthermia Neg Hx   . Hypotension Neg Hx   . Anesthesia problems Neg Hx   . Colon cancer Neg Hx     Social History Social History  Substance Use Topics  . Smoking status: Current Some Day Smoker    Packs/day: 0.25    Years: 10.00    Types: Cigarettes  . Smokeless tobacco: Former Systems developer    Quit date: 02/17/2013     Comment: smokes 4 cigarettes a day   . Alcohol use No     Allergies   Hydrocodone; Tramadol; Ibuprofen; Naproxen; Sulfa antibiotics; and Morphine and related   Review of Systems Review of Systems  Constitutional: Positive for appetite change and chills. Negative for activity change.       All  ROS Neg except as noted in HPI  HENT: Positive for congestion. Negative for nosebleeds.   Eyes: Negative for photophobia and discharge.  Respiratory: Negative for cough, shortness of breath and wheezing.   Cardiovascular: Negative for chest pain and palpitations.  Gastrointestinal: Positive for abdominal pain, nausea and vomiting. Negative for blood in stool.  Genitourinary: Negative for dysuria, frequency and hematuria.  Musculoskeletal: Positive for back pain and myalgias. Negative for arthralgias and neck pain.  Skin: Negative.   Neurological: Negative for dizziness, seizures and speech difficulty.  Psychiatric/Behavioral: Negative for confusion and hallucinations.     Physical Exam Updated Vital Signs BP (!) 169/104 (BP Location: Right Arm)   Pulse (!) 112   Temp 99 F (37.2 C) (Oral)   Resp 12   Ht 6\' 1"  (1.854 m)   Wt 83.9 kg (185 lb)   SpO2 99%   BMI 24.41 kg/m   Physical Exam  Constitutional: He is oriented to person, place, and time. He appears well-developed and well-nourished.  Non-toxic appearance. He appears ill.  Patient is diaphoretic, and as active vomiting during interview and examination.  HENT:  Head: Normocephalic.  Right Ear: Tympanic membrane and external ear normal.  Left Ear: Tympanic membrane and external ear normal.  There is a fruity smell on breath.  Eyes: Pupils are equal, round, and reactive to light. EOM and lids are normal.  Neck: Normal range of motion. Neck supple. Carotid bruit is not present.  Cardiovascular: Regular rhythm, normal heart sounds, intact distal pulses and normal pulses.  Tachycardia present.   Pulmonary/Chest: Breath sounds normal. No respiratory distress.  Abdominal: Soft. Bowel sounds are normal. There is no tenderness. There is no guarding.  Musculoskeletal: Normal range of motion.  Lymphadenopathy:       Head (right side): No submandibular adenopathy present.       Head (left side): No submandibular adenopathy present.      He has no cervical adenopathy.  Neurological: He is alert and oriented to person, place, and time. He has normal strength. No cranial nerve deficit or sensory deficit.  Skin: Skin is warm. He is diaphoretic.  Psychiatric: He has a normal mood and affect. His speech is normal.  Nursing note and vitals reviewed.    ED Treatments / Results  Labs (all labs ordered are listed, but only abnormal results are displayed) Labs Reviewed  CBG MONITORING, ED - Abnormal; Notable for the following:       Result  Value   Glucose-Capillary 307 (*)    All other components within normal limits  COMPREHENSIVE METABOLIC PANEL  ETHANOL  SALICYLATE LEVEL  ACETAMINOPHEN LEVEL  CBC  RAPID URINE DRUG SCREEN, HOSP PERFORMED  CBG MONITORING, ED    EKG  EKG Interpretation  Date/Time:  Thursday November 16 2016 08:23:02 EDT Ventricular Rate:  113 PR Interval:    QRS Duration: 98 QT Interval:  375 QTC Calculation: 515 R Axis:   51 Text Interpretation:  Sinus tachycardia LAE, consider biatrial enlargement Minimal ST depression, diffuse leads Prolonged QT interval Baseline wander in lead(s) V5 ST changes are seen on prior EKG 08/25/2016 Confirmed by Brantley Stage [phone]) on 11/16/2016 8:39:48 AM       Radiology No results found.  Procedures Procedures (including critical care time) CRITICAL CARE Performed by: Lenox Ahr Total critical care time: *35** minutes Critical care time was exclusive of separately billable procedures and treating other patients. Critical care was necessary to treat or prevent imminent or life-threatening deterioration. Critical care was time spent personally by me on the following activities: development of treatment plan with patient and/or surrogate as well as nursing, discussions with consultants, evaluation of patient's response to treatment, examination of patient, obtaining history from patient or surrogate, ordering and performing treatments and interventions,  ordering and review of laboratory studies, ordering and review of radiographic studies, pulse oximetry and re-evaluation of patient's condition. Medications Ordered in ED Medications  sodium chloride 0.9 % bolus 1,000 mL (not administered)    Followed by  0.9 %  sodium chloride infusion (not administered)  ondansetron (ZOFRAN) injection 4 mg (not administered)     Initial Impression / Assessment and Plan / ED Course  I have reviewed the triage vital signs and the nursing notes.  Pertinent labs & imaging results that were available during my care of the patient were reviewed by me and considered in my medical decision making (see chart for details).       Final Clinical Impressions(s) / ED Diagnoses MDM Behavioral health consult pending Temperature is 90.9, pulse rate is 112,blood pressure is 169/104, pulse oximetry is 99% on room air. Patient is having active vomiting. Patient treated with IV Zofran. IV fluid bolus has been started. Capillary blood glucose is elevated at 303.  Patient continues to have nausea and he continues to have sweats. Patient given IV Compazine. Patient continues to complain of pain and requesting pain medication. Electrocardiogram is nonacute.  Competence of metabolic panel shows the sodium to be slightly low at 134, potassium low at 3.4, the glucose is 293 which is elevated. The BUN is elevated at 57, the creatinine is elevated at 6.37. The alkaline phosphatase is elevated at 147, the total bilirubin is elevated at 1.9, ethanol, salicylate, and acetaminophen levels are all negative.  White blood cell count is elevated at 19,100, platelets are high at 526,000. Patient has a PICC line in place. He administers his all antibiotics for methicillin-resistant staph arias infection. He has finished the antibiotics at this time. Given the multiple symptoms of this patient, will obtain blood cultures, as well as check lactic acid.  Portable chest shows central line in  place. There is some cardiomegaly with mild pulmonary venous congestion present. No other changes appreciated.  Nausea improved. Patient able to take ice chips and a few sips of liquids.  Pt care to be continued by Dr Oleta Mouse.   Final diagnoses:  Acute renal failure, unspecified acute renal failure type (HCC)  Intractable vomiting with nausea,  unspecified vomiting type    New Prescriptions New Prescriptions   No medications on file     Lily Kocher, Hershal Coria 11/17/16 1518    Forde Dandy, MD 11/18/16 [phone]

## 2016-11-16 NOTE — H&P (Addendum)
History and Physical  Brent Daniels IHK:[id number] DOB: 1985/04/28 DOA: 11/16/2016  Referring physician: Oleta Mouse PCP: [name], MD   Chief Complaint: nausea and vomiting  HPI: Brent Daniels is a 31 y.o. male with chronic kidney disease, type 1 diabetes mellitus, poorly controlled, presented to the emergency department by [facility] under IVC by his mother. He has been having intractable nausea and vomiting for the past 3 days. He had refused to come into the emergency department. The mother reports that he refuses to care for himself at home. He is abusing medications. He was discharged from the pain management clinic for abusing opioids. He has multiple hospitalizations for multiple regional hospital's. He was recently discharged from [facility] in September 2018 with MSSA sepsis and DKA. He completed a course of IV antibiotics on 10/31/2016.  The patient complains of pain all over. He has a long history of drug-seeking behavior. He has a high tolerance for opioids. He reports that he has not had opioids for several days. He was not prescribed opioids when he was discharge from [facility]. He also has a history of gastroparesis and esophagitis. He was noted in the ED to be clinically dehydrated. He has been vomiting. He is constantly asking for pain medications but his physical exam has been essentially benign. He is not in DKA at this time. He does have a history of this. His IVC was rescinded by the ED physician because the patient was not suicidal and did not have a suicidal plan, not homicidal and did not have any homicidal plans. The patient is being admitted into the hospital for his intractable nausea and vomiting concerns for uremia. His creatinine has increased from 5.08 to now greater than 6. I reviewed the records from [facility] and he was seen by a nephrologist there and they were concerned that he was rapidly approaching the  need to have hemodialysis. It is unclear if the patient ever followed up with outpatient nephrology and eaten as they had recommended for him. The patient reports that he does have small amount of urine output. We are currently awaiting to have his urine tested.  He was noted to have a bicuspid aortic valve at [facility]. He reports last bowel movement was yesterday.   Severity of Illness: The appropriate patient status for this patient is INPATIENT. Inpatient status is judged to be reasonable and necessary in order to provide the required intensity of service to ensure the patient's safety. The patient's presenting symptoms, physical exam findings, and initial radiographic and laboratory data in the context of their chronic comorbidities is felt to place them at high risk for further clinical deterioration. Furthermore, it is not anticipated that the patient will be medically stable for discharge from the hospital within 2 midnights of admission. The following factors support the patient status of inpatient.   " The patient's presenting symptoms include intractable nausea and vomiting. " The worrisome physical exam findings include dry mucus membranes. " The initial radiographic and laboratory data are worrisome because of worsening creatinine now greater than 6. " The chronic co-morbidities include CKD, IDDM, HTN, gastroparesis.  * I certify that at the point of admission it is my clinical judgment that the patient will require inpatient hospital care spanning beyond 2 midnights from the point of admission due to high intensity of service, high risk for further deterioration and high frequency of surveillance required.*  Review of Systems: All  systems reviewed and apart from history of presenting illness, are negative.  Past Medical History:  Diagnosis Date  . Acute esophagitis   . Anxiety   . Arthritis   . Chronic abdominal pain   . Chronic back pain   . Diabetes mellitus    type 1    . Diabetic gastroparesis associated with type 1 diabetes mellitus ([place])   . Erosive esophagitis 12/23/2013   "severe" per EGD   . Gastroparesis   . GERD (gastroesophageal reflux disease)   . Heart murmur    valvular stenosis  . Hypercholesteremia   . Hypertension   . Insomnia   . Nausea and vomiting    chronic, recurrent  . Neuropathy   . PUD (peptic ulcer disease)   . S/P arthroscopic knee surgery 07/04/2011  . Scoliosis 07/11/2012   Past Surgical History:  Procedure Laterality Date  . BIOPSY  06/17/2013   Procedure: GASTRIC ULCER AND ANTRAL BIOPSIES; ESOPHAGEAL BRUSHING;  Surgeon: Danie Binder, MD;  Location: [facility];  Service: Endoscopy;;  . BIOPSY N/A 12/23/2013   Procedure: ESOPHAGEAL BIOPSY;  Surgeon: Daneil Dolin, MD;  Location: [facility];  Service: Endoscopy;  Laterality: N/A;  . CHOLECYSTECTOMY N/A 01/28/2014   Procedure: LAPAROSCOPIC CHOLECYSTECTOMY;  Surgeon: Jamesetta So, MD;  Location: [facility];  Service: General;  Laterality: N/A;  . ESOPHAGOGASTRODUODENOSCOPY (EGD) WITH PROPOFOL  06/17/2013   Dr. Oneida Alar: two gastric ulcers in fundus, mild antral gastritis, candida esophagitis  . ESOPHAGOGASTRODUODENOSCOPY (EGD) WITH PROPOFOL N/A 09/11/2013   Dr. [name]: abnormal hypopharynx, question massively enlarged tonsils, stomach full of food precluded exam, needs EGD for verification of ulcer healing at a later date  . ESOPHAGOGASTRODUODENOSCOPY (EGD) WITH PROPOFOL N/A 12/23/2013   RMR: Severe exudative esophagitis likely predominatly reflux related. Superimposed Candida infection not excluded status post KOH brushing and biopsy. Localized excoriating gastric mucosa most consistant with trauma(vomiting). No evidence of peptic ulcer disease or other gastric/duodenal pathology. I suspect severe inflammation involving the distal esophagus may account  for at least some of patii  . KNEE ARTHROSCOPY  06/30/2011   Procedure: ARTHROSCOPY KNEE;  Surgeon: Carole Civil, MD;  Location: [facility];  Service: Orthopedics;  Laterality: Right;  diagnostic arthroscopy  . TYMPANOSTOMY TUBE PLACEMENT     Social History:  reports that he has been smoking Cigarettes.  He has a 2.50 pack-year smoking history. He quit smokeless tobacco use about 3 years ago. He reports that he does not drink alcohol or use drugs.  Allergies  Allergen Reactions  . Hydrocodone Hives  . Tramadol Anaphylaxis and Other (See Comments)    Acid Reflux  . Ibuprofen Other (See Comments)    Stomach ulcers  . Naproxen Other (See Comments)    Stomach ulcers  . Sulfa Antibiotics Other (See Comments)    Stomach ulcers  . Morphine And Related Rash    Family History  Problem Relation Age of Onset  . Cancer Father   . Alcohol abuse Father   . Pseudochol deficiency Neg Hx   . Malignant hyperthermia Neg Hx   . Hypotension Neg Hx   . Anesthesia problems Neg Hx   . Colon cancer Neg Hx     Prior to Admission medications   Medication Sig Start Date End Date Taking? Authorizing Provider  amLODipine (NORVASC) 10 MG tablet Take 10 mg by mouth daily.  05/08/15  Yes [provider]  aspirin 81 MG tablet Take 81 mg by mouth daily.   Yes [provider]  gabapentin (NEURONTIN) 300 MG capsule Take 300 mg by mouth 2 (two) times daily.    Yes [provider]  Insulin Glargine (BASAGLAR KWIKPEN) 100 UNIT/ML SOPN Inject 20 Units into the skin daily. 05/19/16  Yes [provider]  insulin regular (NOVOLIN R,HUMULIN R) 100 units/mL injection Inject 2-10 Units into the skin 3 (three) times daily before meals. Sliding scale.   Yes [provider]  metoCLOPramide (REGLAN) 10 MG tablet 10 mg 4 (four) times daily -  before meals and at bedtime. 04/26/15  Yes [provider]  metoprolol succinate (TOPROL-XL) 100 MG 24 hr tablet Take 1 tablet (100 mg total) by mouth daily. 08/27/16  Yes Tat, Shanon Brow, MD  oxyCODONE-acetaminophen (PERCOCET) 10-325 MG tablet Take by mouth. 05/20/15  Yes  [provider]  pantoprazole (PROTONIX) 40 MG tablet Take 1 tablet (40 mg total) by mouth 2 (two) times daily before a meal. Patient taking differently: Take 40 mg by mouth daily.  04/10/14  Yes Annitta Needs, NP  tiZANidine (ZANAFLEX) 4 MG tablet 4 mg 2 (two) times daily.  05/11/15  Yes [provider]  Vitamin D, Ergocalciferol, (DRISDOL) 50000 units CAPS capsule Take 50,000 Units by mouth every 7 (seven) days.   Yes [provider]   Physical Exam: Vitals:   11/16/16 0921 11/16/16 0930 11/16/16 1030 11/16/16 1130  BP: (!) 169/114 (!) 181/94 (!) 171/116 (!) 170/84  Pulse: (!) 112 (!) 113    Resp:   19 (!) 22  Temp:      TempSrc:      SpO2:  100%    Weight:      Height:         General exam: Moderately built and nourished patient, lying comfortably supine on the gurney in no obvious distress. He is yelling out for pain medications.   Head, eyes and ENT: Nontraumatic and normocephalic. Pupils equally reacting to light and accommodation. Oral mucosa pink / dry.  Neck: Supple. No JVD, carotid bruit or thyromegaly.  Lymphatics: No lymphadenopathy.  Respiratory system: Clear to auscultation. No increased work of breathing.  Cardiovascular system: S1 and S2 heard, tachycardic. No JVD.  Gastrointestinal system: Abdomen is nondistended, soft and nontender. Normal bowel sounds heard. No organomegaly or masses appreciated.  Central nervous system: Alert and oriented. No focal neurological deficits.  Extremities: Symmetric 5 x 5 power. Peripheral pulses symmetrically felt.   Skin: No rashes or acute findings.  Musculoskeletal system: Negative exam.  Psychiatry: Pleasant and cooperative.  Labs on Admission:  Basic Metabolic Panel:  Recent Labs Lab 11/16/16 0853  NA 134*  K 3.4*  CL 77*  CO2 28  GLUCOSE 293*  BUN 57*  CREATININE 6.37*  CALCIUM 10.4*   Liver Function Tests:  Recent Labs Lab 11/16/16 0853  AST 16  ALT 8*  ALKPHOS 147*    BILITOT 1.9*  PROT 9.4*  ALBUMIN 4.9   No results for input(s): LIPASE, AMYLASE in the last 168 hours. No results for input(s): AMMONIA in the last 168 hours. CBC:  Recent Labs Lab 11/16/16 0853  WBC 19.1*  HGB 12.1*  HCT 35.9*  MCV 85.7  PLT 526*   Cardiac Enzymes: No results for input(s): CKTOTAL, CKMB, CKMBINDEX, TROPONINI in the last 168 hours.  BNP (last 3 results) No results for input(s): PROBNP in the last 8760 hours. CBG:  Recent Labs Lab 11/16/16 0827  GLUCAP 307*    Radiological Exams on Admission: Dg Chest Portable 1 View  Result  Date: 11/16/2016 CLINICAL DATA:  Body aches.  Diaphoresis. EXAM: PORTABLE CHEST 1 VIEW COMPARISON:  11/07/2016. FINDINGS: Central line noted with tip at cavoatrial junction. Cardiomegaly with mild pulmonary venous congestion. No focal pulmonary infiltrate. No pleural effusion or pneumothorax. IMPRESSION: 1. Central line with tip at cavoatrial shin. 2. Cardiomegaly with mild pulmonary venous congestion. Electronically Signed   By: Marcello Moores  Register   On: 11/16/2016 09:59    EKG: Independently reviewed.   Assessment/Plan Principal Problem:   Uremia Active Problems:   Essential hypertension   GERD (gastroesophageal reflux disease)   Intractable nausea and vomiting   Chronic generalized abdominal pain   Leukocytosis   PUD (peptic ulcer disease)   Hyperglycemia   Gastroparesis   Drug abuse ([place])   Tobacco use   HLD (hyperlipidemia)   Hyperglycemia due to type 1 diabetes mellitus ([place])   Uncontrolled type 1 diabetes mellitus with hyperglycemia, with long-term current use of insulin ([facility])   Neuropathy   1. Uremia-I'm concerned about worsening renal function given the symptoms of malaise and nausea and vomiting. Requesting a nephrology consult for consideration of hemodialysis. Checking a phosphorus level.  US renal ordered.   2. Intractable nausea and vomiting-clear liquid diet, IV nausea medicines ordered. IV metoclopramide  ordered for 3 doses. Advance diet slowly as tolerated pending clinical course. Patient was noted in 2015 to have a severe esophagitis 3. Type 1 diabetes mellitus, poorly controlled with multiple complications-check a hemoglobin A1c, resume basal Lantus 20 units daily, NovoLog 5 units 3 times a day before meals plus supplemental coverage for high blood glucose readings. 4. GERD-IV pantoprazole ordered. 5. Chronic pain with history of drug diversion and drug-seeking behaviors-avoiding narcotics. I have ordered a urine drug screen. He was not prescribed narcotics for opioids when he was discharged from [facility] in September 2018. He was also discharged from his chronic pain management clinic for drug diversion. He has no physical exam findings to support the degree of abdominal pain that he is describing. We will make every effort to avoid opioids.  He has a benign abdominal exam. 6. Tobacco abuse-nicotine patch ordered for use in the hospital. Ongoing counseling for tobacco cessation will be performed. 7. History of peptic ulcer disease-IV pantoprazole ordered. His EGD from 2015 did not reveal PUD. 8. Essential hypertension-currently uncontrolled-not taking medications at home, IV hydralazine ordered and will resume home blood pressure medications. 9. Bicuspid aortic valve-noted on TEE performed at [facility] in September of this year. It was recommended that he follow-up with cardiology outpatient for ongoing surveillance. 10. Diabetic neuropathy and gastroparesis-secondary to poorly controlled type 1 diabetes mellitus. Would hold gabapentin in the setting of worsening CK D and possible uremia. IV metoclopramide ordered. GI consultation requested. 11. IVC-rescinded by the emergency department physician as patient is denying suicidal ideation, homicidal ideation and any plans to harm self or others. 12. Anemia of CKD - Hg 12.1, will follow.  Pt had been prescribed iron tablet 325 mg daily  by nephrology at [facility].  13. Vitamin D Deficiency - resume weekly vitamin D replacement.  14. Leukocytosis - suspect this is reactive from 3 days of intractable vomiting, no infection found, awaiting urinalysis and culture, follow clinically.  Repeat CBC in AM.    DVT Prophylaxis: heparin Code Status: Full   Family Communication: none present   Disposition Plan: TBD   Time spent: 70 mins  Irwin Brakeman, MD [facility] Pager [phone]  If 7PM-7AM, please contact night-coverage [email or web address] Password [email or web address]  11/16/2016, 12:41 PM

## 2016-11-16 NOTE — ED Notes (Signed)
Pt conts to intermittently vomit and continues to ask for ice chips.

## 2016-11-16 NOTE — ED Notes (Signed)
Pt spitting ice across room. Continues to vomit, but begging for ice water. Per EDP may have ice chips. Continually asking for pain beds. Per EDP no pain meds at this time

## 2016-11-16 NOTE — BH Assessment (Signed)
TTS Note   Pt not able to be seen- pt is not medically clear at this time. He has not been seen by the doctor and has nausea/vomiting per RN. Instructed RN to call TTS back when pt is medically clear and seen by doctor. Pt is denying SI/HI/AVH on admission however is IVCd by mom due to prescription drug abuse and "not caring whether he lives or dies".   Past Medical History:  Past Medical History:  Diagnosis Date  . Acute esophagitis   . Anxiety   . Arthritis   . Chronic abdominal pain   . Chronic back pain   . Diabetes mellitus    type 1  . Diabetic gastroparesis associated with type 1 diabetes mellitus ([place])   . Erosive esophagitis 12/23/2013   "severe" per EGD   . Gastroparesis   . GERD (gastroesophageal reflux disease)   . Heart murmur    valvular stenosis  . Hypercholesteremia   . Hypertension   . Insomnia   . Nausea and vomiting    chronic, recurrent  . Neuropathy   . PUD (peptic ulcer disease)   . S/P arthroscopic knee surgery 07/04/2011  . Scoliosis 07/11/2012    Past Surgical History:  Procedure Laterality Date  . BIOPSY  06/17/2013   Procedure: GASTRIC ULCER AND ANTRAL BIOPSIES; ESOPHAGEAL BRUSHING;  Surgeon: Danie Binder, MD;  Location: [facility];  Service: Endoscopy;;  . BIOPSY N/A 12/23/2013   Procedure: ESOPHAGEAL BIOPSY;  Surgeon: Daneil Dolin, MD;  Location: [facility];  Service: Endoscopy;  Laterality: N/A;  . CHOLECYSTECTOMY N/A 01/28/2014   Procedure: LAPAROSCOPIC CHOLECYSTECTOMY;  Surgeon: Jamesetta So, MD;  Location: [facility];  Service: General;  Laterality: N/A;  . ESOPHAGOGASTRODUODENOSCOPY (EGD) WITH PROPOFOL  06/17/2013   Dr. Oneida Alar: two gastric ulcers in fundus, mild antral gastritis, candida esophagitis  . ESOPHAGOGASTRODUODENOSCOPY (EGD) WITH PROPOFOL N/A 09/11/2013   Dr. [name]: abnormal hypopharynx, question massively enlarged tonsils, stomach full of food precluded exam, needs EGD for verification of ulcer healing at a later date  .  ESOPHAGOGASTRODUODENOSCOPY (EGD) WITH PROPOFOL N/A 12/23/2013   RMR: Severe exudative esophagitis likely predominatly reflux related. Superimposed Candida infection not excluded status post KOH brushing and biopsy. Localized excoriating gastric mucosa most consistant with trauma(vomiting). No evidence of peptic ulcer disease or other gastric/duodenal pathology. I suspect severe inflammation involving the distal esophagus may account  for at least some of patii  . KNEE ARTHROSCOPY  06/30/2011   Procedure: ARTHROSCOPY KNEE;  Surgeon: Carole Civil, MD;  Location: [facility];  Service: Orthopedics;  Laterality: Right;  diagnostic arthroscopy  . TYMPANOSTOMY TUBE PLACEMENT      Family History:  Family History  Problem Relation Age of Onset  . Cancer Father   . Alcohol abuse Father   . Pseudochol deficiency Neg Hx   . Malignant hyperthermia Neg Hx   . Hypotension Neg Hx   . Anesthesia problems Neg Hx   . Colon cancer Neg Hx     Social History:  reports that he has been smoking Cigarettes.  He has a 2.50 pack-year smoking history. He quit smokeless tobacco use about 3 years ago. He reports that he does not drink alcohol or use drugs.  Additional Social History:     CIWA: CIWA-Ar BP: (!) 169/104 Pulse Rate: (!) 112 COWS:    Allergies:  Allergies  Allergen Reactions  . Hydrocodone Hives  . Tramadol Anaphylaxis and Other (See Comments)    Acid Reflux  . Ibuprofen Other (  See Comments)    Stomach ulcers  . Naproxen Other (See Comments)    Stomach ulcers  . Sulfa Antibiotics Other (See Comments)    Stomach ulcers  . Morphine And Related Rash     General Assessment Data Assessment unable to be completed: Yes Reason for not completing assessment: Pt not medically clear- not seen by the doctor  Location of Assessment: [facility] ED TTS Assessment: In system   Jerral Bonito Dais[place], [place]  11/16/2016 8:36 AM

## 2016-11-16 NOTE — ED Notes (Signed)
Pt laying back in bed at this time

## 2016-11-16 NOTE — ED Notes (Signed)
Patient keeps taking EKG leads, BP, and oxygen probe.

## 2016-11-16 NOTE — ED Notes (Signed)
Pt sitting at the edge of bed stating "give me some water" stated that EDP said only ice chips. Pt stands up and unhooks self from BP cord and gets cup of water from sink and drinks. Now pt vomiting . Law enforcement called

## 2016-11-16 NOTE — ED Notes (Signed)
Paged Dr Wynetta Emery due to [facility] HI

## 2016-11-16 NOTE — ED Provider Notes (Addendum)
Medical screening examination/treatment/procedure(s) were conducted as a shared visit with non-physician practitioner(s) and myself.  I personally evaluated the patient during the encounter.   EKG Interpretation  Date/Time:  Thursday November 16 2016 08:23:02 EDT Ventricular Rate:  113 PR Interval:    QRS Duration: 98 QT Interval:  375 QTC Calculation: 515 R Axis:   51 Text Interpretation:  Sinus tachycardia LAE, consider biatrial enlargement Minimal ST depression, diffuse leads Prolonged QT interval Baseline wander in lead(s) V5 ST changes are seen on prior EKG 08/25/2016 Confirmed by Brantley Stage [phone]) on 11/16/2016 8:39:12 AM      31 year old male with history of poorly controlled DM who presents with nausea, vomiting. Mother IVC'd him this morning because he is noncompliant with medical treatments. Denies SI/HI. Cut off from narcotics medications recently, has been having worsening acute on chronic abdominal pain. No fever or chills, cough, or urinary complaints.  Afebrile tachycardic on arrival. Dry on exam. Soft benign abdomen, generalized tenderness.  Acute on chronic renal failure, creatine 6. Potassium of 3.4.  Leukocytosis of 19 but no obvious source of infection. No signs of DKA. Given ivf, antieemetics. Intractable nausea and vomiting. Likely from opiate withdrawal vs gastroparesis.  Discussed with Dr. Wynetta Emery who will admit for ongoing management.   Of note, patient without SI/HI, AVH and no concerns for psychosis. He is just not compliant with medications and medical advice. This is not a acute psychiatric condition. IVC is rescinded.  Forde Dandy, MD 11/16/16 1322    Forde Dandy, MD 11/16/16 [phone]

## 2016-11-16 NOTE — ED Notes (Signed)
Called dr Lorin Mercy about the pt's cbg of >600.

## 2016-11-16 NOTE — ED Notes (Signed)
Patient gets out of  Bed and gets water from sink in the room even after being told by sitter, nurse, and doctor that he cannot have anything but ice chips.  Patient continues to vomit due to drinking water.  Patient spit in the floor - and dropped entire cup of ice chips because there was no water in the cup.  Patient disconnects the heart monitor, blood pressure cup and pulse ox

## 2016-11-16 NOTE — ED Notes (Addendum)
Date and time results received: 11/16/16 8:26 PM  Test: glucose  Critical Value: 748  Name of Provider Notified: paged hospitialist at this time  Orders Received? Or Actions Taken?:

## 2016-11-16 NOTE — ED Notes (Signed)
Pt transported to US

## 2016-11-16 NOTE — ED Notes (Signed)
CRITICAL VALUE ALERT  Critical Value:  Lactic Acid  Date & Time Notied:  11/16/2016 1134  Provider Notified:  Lily Kocher  Orders Received/Actions taken: No orders received at this time.

## 2016-11-16 NOTE — Progress Notes (Signed)
Called for patient with persistent hyperglycemia, too high to register.  I reviewed Dr. Durenda Age notes and plan.  For now, will attempt a 1-time dose of IV insulin 10 units and continue to closely monitor.  Since he is chronically poorly controlled (A1c 9.7 in July) and not in DKA (CO2 28 today), will attempt to control more aggressively without Glucostabilizer.  If ineffective, patient will need the Glucostabilizer protocol and transfer to SDU instead.  Carlyon Shadow, M.D.

## 2016-11-16 NOTE — ED Notes (Signed)
IVC's papers rescinded per Dr Oleta Mouse

## 2016-11-16 NOTE — ED Notes (Signed)
Spoke with Dr Cindee Salt- gave order to give 20 Units novolog now and recheck one hr. Repeat order to verify. Also, notified hosp that pt was continually asking for water and is [place] with Pt drinking.

## 2016-11-16 NOTE — ED Notes (Signed)
Pt undoing  IV to walk to sink and drink water.  Spoke with Dr. Wynetta Emery gave order to give kwikpen insulin, novolog 15 units and recheck cbg 1 hr

## 2016-11-16 NOTE — Consult Note (Signed)
Brent Daniels MRN: [id number] DOB/AGE: 33-Nov-1987 33 y.o. Primary Care Physician:Corrington, Delsa Grana, MD Admit date: 11/16/2016 Chief Complaint:  Chief Complaint  Patient presents with  . Medical Clearance   HPI: Pt is a 31 year old male with past medical hx of   chronic kidney disease, type 1 diabetes mellitus, poorly controlled  Who was brought to  the emergency department by [facility] Department under IVC by his mother.   HPI dates back to 3-4 days when pt started  having intractable nausea and vomiting . Pt  refused to come into the emergency department. His mother reported that he refuses to care for himself at home. There is hx of  abusing medications. Pt was discharged from the pain management clinic for abusing opioids. Pt  Has had multiple hospitalizations at  multiple different hospital's. Pt  was recently discharged from [facility] in September 2018 . Pt  c/o change in apptitte Pt C/o malaise. Pt c/o lack of energy. Pt main concern to me was " can you get me something for pain?" Pt offers no other concerns. IN ER -Pt was brought under IVC but His IVC was rescinded by the ED physician because the patient was not suicidal and did not have a suicidal plan, not homicidal and did not have any homicidal plans.  The patient is being admitted into the hospital for his intractable nausea and vomiting concerns for uremia. His creatinine has increased from 5.08 to now greater than 6.    Past Medical History:  Diagnosis Date  . Acute esophagitis   . Anxiety   . Arthritis   . Chronic abdominal pain   . Chronic back pain   . Diabetes mellitus    type 1  . Diabetic gastroparesis associated with type 1 diabetes mellitus ([place])   . Erosive esophagitis 12/23/2013   "severe" per EGD   . Gastroparesis   . GERD (gastroesophageal reflux disease)   . Heart murmur    valvular stenosis  . Hypercholesteremia   . Hypertension   . Insomnia   . Nausea and vomiting    chronic,  recurrent  . Neuropathy   . PUD (peptic ulcer disease)   . S/P arthroscopic knee surgery 07/04/2011  . Scoliosis 07/11/2012        Family History  Problem Relation Age of Onset  . Cancer Father   . Alcohol abuse Father   . Pseudochol deficiency Neg Hx   . Malignant hyperthermia Neg Hx   . Hypotension Neg Hx   . Anesthesia problems Neg Hx   . Colon cancer Neg Hx     Social History:  reports that he has been smoking Cigarettes.  He has a 2.50 pack-year smoking history. He quit smokeless tobacco use about 3 years ago. He reports that he does not drink alcohol or use drugs.   Allergies:  Allergies  Allergen Reactions  . Hydrocodone Hives  . Tramadol Anaphylaxis and Other (See Comments)    Acid Reflux  . Ibuprofen Other (See Comments)    Stomach ulcers  . Naproxen Other (See Comments)    Stomach ulcers  . Sulfa Antibiotics Other (See Comments)    Stomach ulcers  . Morphine And Related Rash     (Not in a hospital admission)     DUK:GURKY from the symptoms mentioned above,there are no other symptoms referable to all systems reviewed.      Physical Exam: Vital signs in last 24 hours: Temp:  [98.4 F (36.9 C)-99  F (37.2 C)] 98.4 F (36.9 C) (10/11 1428) Pulse Rate:  [112-120] 120 (10/11 1428) Resp:  [10-22] 18 (10/11 1428) BP: (150-181)/(78-116) 158/101 (10/11 1428) SpO2:  [99 %-100 %] 100 % (10/11 1428) Weight:  [185 lb (83.9 kg)] 185 lb (83.9 kg) (10/11 0822) Weight change:     Intake/Output from previous day: No intake/output data recorded. No intake/output data recorded.   Physical Exam: General- pt is awake,alert, oriented to time place and person Resp- No acute REsp distress, CTA B/L NO Rhonchi CVS- S1S2 regular in rate and rhythm GIT- BS+, soft, NT, ND EXT- NO LE Edema, Cyanosis CNS- CN 2-12 grossly intact. Moving all 4 extremities Psych- normal mood and affect    Lab Results: CBC  Recent Labs  11/16/16 0853  WBC 19.1*  HGB 12.1*   HCT 35.9*  PLT 526*    BMET  Recent Labs  11/16/16 0853  NA 134*  K 3.4*  CL 77*  CO2 28  GLUCOSE 293*  BUN 57*  CREATININE 6.37*  CALCIUM 10.4*    Creat trend 2018  October  6.37 September at [place]   4.9--5.7 July 3.2--4.6 2016  1.3           MICRO Recent Results (from the past 240 hour(s))  Culture, blood (Routine X 2) w Reflex to ID Panel     Status: None (Preliminary result)   Collection Time: 11/16/16 10:41 AM  Result Value Ref Range Status   Specimen Description BLOOD LEFT HAND  Final   Special Requests   Final    BOTTLES DRAWN AEROBIC ONLY Blood Culture results may not be optimal due to an inadequate volume of blood received in culture bottles   Culture PENDING  Incomplete   Report Status PENDING  Incomplete  Culture, blood (Routine X 2) w Reflex to ID Panel     Status: None (Preliminary result)   Collection Time: 11/16/16 10:41 AM  Result Value Ref Range Status   Specimen Description BLOOD LEFT HAND  Final   Special Requests   Final    BOTTLES DRAWN AEROBIC AND ANAEROBIC Blood Culture adequate volume   Culture PENDING  Incomplete   Report Status PENDING  Incomplete      Lab Results  Component Value Date   CALCIUM 10.4 (H) 11/16/2016   CAION 1.15 03/31/2014   CAION 1.15 03/31/2014   PHOS 3.7 11/16/2016      Impression: 1)Renal  AKI secondary to Prerenal/ATN/ Hypercalcemia associated afferent vasoconstriction                vs  CKD progression               AKI on CKD               CKD stage 5.               CKD since 2016               CKD secondary to DM 1                Progression of CKD                  as expected for uncontrolled DM 1-Pt with 22 years of DM                 multiple AKI                Proteinura Present.  2)HTN  Medication- On Calcium Channel Blockers On Beta blockers   3)Anemia HGb at goal (9--11)  4)CKD Mineral-Bone Disorder PTH not avail Secondary Hyperparathyroidism w/u  pending. Phosphorus at goal. Calcium- high   Etiology   Volume vs pt on PO ergocalciferol as outpt  5)GI- admitted with Nausea and vomiting Primary MD following  6)Electrolytes Hypokalemic   Hypovolemic   Emesis    Hypoonatremic   Hypovolemic    CKD   7)Acid base Co2 at goal Low chloride Metabolic acidosis from CKD and   Metabolic alkalosis component from vomiting     Plan:  Agree with IVF Will ask for CKD-BMD work up . Will follow bmet Will ask for renal u/s to look  At renal size NO need for HD tonight but I educate pt about need of HD sooner.     Camey Edell S 11/16/2016, 2:33 PM

## 2016-11-16 NOTE — ED Notes (Signed)
Pt getting up to the sink and filling ice cup with water and drinking EDP aware

## 2016-11-16 NOTE — Progress Notes (Addendum)
cbg high, give 15 unit novolog now, give lantus 20 units, recheck CBG 1 hour.  Bolus NS 750 cc  Murvin Natal MD

## 2016-11-17 ENCOUNTER — Inpatient Hospital Stay (HOSPITAL_COMMUNITY): Payer: Managed Care, Other (non HMO)

## 2016-11-17 DIAGNOSIS — N179 Acute kidney failure, unspecified: Secondary | ICD-10-CM

## 2016-11-17 DIAGNOSIS — R739 Hyperglycemia, unspecified: Secondary | ICD-10-CM

## 2016-11-17 DIAGNOSIS — D72829 Elevated white blood cell count, unspecified: Secondary | ICD-10-CM

## 2016-11-17 DIAGNOSIS — K219 Gastro-esophageal reflux disease without esophagitis: Secondary | ICD-10-CM

## 2016-11-17 DIAGNOSIS — N170 Acute kidney failure with tubular necrosis: Secondary | ICD-10-CM

## 2016-11-17 DIAGNOSIS — G8929 Other chronic pain: Secondary | ICD-10-CM

## 2016-11-17 DIAGNOSIS — K2210 Ulcer of esophagus without bleeding: Secondary | ICD-10-CM

## 2016-11-17 DIAGNOSIS — R112 Nausea with vomiting, unspecified: Secondary | ICD-10-CM

## 2016-11-17 DIAGNOSIS — F1910 Other psychoactive substance abuse, uncomplicated: Secondary | ICD-10-CM

## 2016-11-17 DIAGNOSIS — R1084 Generalized abdominal pain: Secondary | ICD-10-CM

## 2016-11-17 DIAGNOSIS — N19 Unspecified kidney failure: Secondary | ICD-10-CM

## 2016-11-17 DIAGNOSIS — K3184 Gastroparesis: Secondary | ICD-10-CM

## 2016-11-17 DIAGNOSIS — N185 Chronic kidney disease, stage 5: Secondary | ICD-10-CM

## 2016-11-17 LAB — CBC
HEMATOCRIT: 34.6 % — AB (ref 39.0–52.0)
HEMOGLOBIN: 11.4 g/dL — AB (ref 13.0–17.0)
MCH: 28.6 pg (ref 26.0–34.0)
MCHC: 32.9 g/dL (ref 30.0–36.0)
MCV: 86.9 fL (ref 78.0–100.0)
Platelets: 470 10*3/uL — ABNORMAL HIGH (ref 150–400)
RBC: 3.98 MIL/uL — ABNORMAL LOW (ref 4.22–5.81)
RDW: 15.6 % — ABNORMAL HIGH (ref 11.5–15.5)
WBC: 24.9 10*3/uL — ABNORMAL HIGH (ref 4.0–10.5)

## 2016-11-17 LAB — COMPREHENSIVE METABOLIC PANEL
ALBUMIN: 4.3 g/dL (ref 3.5–5.0)
ALT: 10 U/L — ABNORMAL LOW (ref 17–63)
AST: 10 U/L — AB (ref 15–41)
Alkaline Phosphatase: 125 U/L (ref 38–126)
BUN: 61 mg/dL — AB (ref 6–20)
CHLORIDE: 79 mmol/L — AB (ref 101–111)
CO2: 33 mmol/L — AB (ref 22–32)
CREATININE: 6.28 mg/dL — AB (ref 0.61–1.24)
Calcium: 9.1 mg/dL (ref 8.9–10.3)
GFR calc Af Amer: 12 mL/min — ABNORMAL LOW (ref >60)
GFR calc non Af Amer: 11 mL/min — ABNORMAL LOW (ref >60)
GLUCOSE: 352 mg/dL — AB (ref 65–99)
Potassium: 3.1 mmol/L — ABNORMAL LOW (ref 3.5–5.1)
Sodium: 136 mmol/L (ref 135–145)
Total Bilirubin: 2.2 mg/dL — ABNORMAL HIGH (ref 0.3–1.2)
Total Protein: 7.9 g/dL (ref 6.5–8.1)

## 2016-11-17 LAB — GLUCOSE, CAPILLARY
GLUCOSE-CAPILLARY: 310 mg/dL — AB (ref 65–99)
Glucose-Capillary: 222 mg/dL — ABNORMAL HIGH (ref 65–99)
Glucose-Capillary: 142 mg/dL — ABNORMAL HIGH (ref 65–99)

## 2016-11-17 LAB — PTH, INTACT AND CALCIUM
Calcium, Total (PTH): 9.5 mg/dL (ref 8.7–10.2)
PTH: 125 pg/mL — ABNORMAL HIGH (ref 15–65)

## 2016-11-17 LAB — VITAMIN D 25 HYDROXY (VIT D DEFICIENCY, FRACTURES): Vit D, 25-Hydroxy: 13 ng/mL — ABNORMAL LOW (ref 30.0–100.0)

## 2016-11-17 LAB — PHOSPHORUS: PHOSPHORUS: 5.5 mg/dL — AB (ref 2.5–4.6)

## 2016-11-17 LAB — ALBUMIN: Albumin: 4.2 g/dL (ref 3.5–5.0)

## 2016-11-17 LAB — CBG MONITORING, ED: GLUCOSE-CAPILLARY: 356 mg/dL — AB (ref 65–99)

## 2016-11-17 LAB — T3: T3, Total: 85 ng/dL (ref 71–180)

## 2016-11-17 LAB — MAGNESIUM: Magnesium: 2.8 mg/dL — ABNORMAL HIGH (ref 1.7–2.4)

## 2016-11-17 MED ORDER — ONDANSETRON HCL 4 MG/2ML IJ SOLN
4.0000 mg | Freq: Four times a day (QID) | INTRAMUSCULAR | Status: DC
  Administered 2016-11-17 – 2016-11-18 (×2): 4 mg via INTRAVENOUS
  Filled 2016-11-17 (×2): qty 2

## 2016-11-17 MED ORDER — LORAZEPAM 0.5 MG PO TABS
0.5000 mg | ORAL_TABLET | Freq: Four times a day (QID) | ORAL | Status: DC | PRN
  Administered 2016-11-17 – 2016-11-19 (×8): 0.5 mg via ORAL
  Filled 2016-11-17 (×9): qty 1

## 2016-11-17 MED ORDER — SODIUM CHLORIDE 0.9 % IV SOLN
1000.0000 mL | INTRAVENOUS | Status: DC
  Administered 2016-11-17 (×2): 1000 mL via INTRAVENOUS

## 2016-11-17 MED ORDER — POTASSIUM CHLORIDE 10 MEQ/100ML IV SOLN
10.0000 meq | INTRAVENOUS | Status: AC
  Administered 2016-11-17 (×3): 10 meq via INTRAVENOUS
  Filled 2016-11-17: qty 100

## 2016-11-17 MED ORDER — PANTOPRAZOLE SODIUM 40 MG IV SOLR
40.0000 mg | Freq: Two times a day (BID) | INTRAVENOUS | Status: DC
  Administered 2016-11-17 – 2016-11-18 (×3): 40 mg via INTRAVENOUS
  Filled 2016-11-17 (×3): qty 40

## 2016-11-17 MED ORDER — SUCRALFATE 1 GM/10ML PO SUSP
1.0000 g | Freq: Three times a day (TID) | ORAL | Status: DC
  Administered 2016-11-17 – 2016-11-19 (×6): 1 g via ORAL
  Filled 2016-11-17 (×6): qty 10

## 2016-11-17 MED ORDER — GI COCKTAIL ~~LOC~~
30.0000 mL | Freq: Three times a day (TID) | ORAL | Status: DC | PRN
  Administered 2016-11-17 (×2): 30 mL via ORAL
  Filled 2016-11-17 (×2): qty 30

## 2016-11-17 NOTE — ED Notes (Signed)
Pt refused to have grown and/or bed changed. The bed and gown has vomit on it. He continues to vomit in the floor. He continue to drink water and vomit it up. It has been told that he continues to drink water he would continue to vomits. He does not appear to understand the connection between the drinking water and vomiting it up which causes his throat to burn. Pt medicated with his nausea medications.

## 2016-11-17 NOTE — Consult Note (Signed)
Referring Provider: No ref. provider found Primary Care Physician:  [name], MD Primary Gastroenterologist:  Dr. [name]  Date of Admission: 11/16/16 Date of Consultation: 11/17/16  Reason for Consultation:  Intractable nausea and vomiting  HPI:  Brent Daniels is a 31 y.o. male with a past medical history of acute erosive esophagitis, anxiety, chronic abdominal pain, DM, gastroparesis, GERD. He has a history of medical noncompliance as well.he presented to the emergency department after his mother IV seed him this morning because he is noncompliant with medical treatments. However he denied suicidal and homicidal ideations.Worsening acute on chronic abdominal pain after being "cut off" from narcotics recently. Per chart notes, he complained of intractable nausea and vomiting for the past 3 days. Recently discharged from pain management clinic for opioid abuse. Recently discharged from [facility] in September 2018 with an MSSA for sepsis and DKA. He completed antibiotics 10/31/2016 at home with a central line.  n the ER he complained of generalized pain all over. Has an established history of drug-seeking behavior and high tolerance of opioids. History of gastroparesis and esophagitis. Dehydrated in the emergency department and persistent vomiting noted. Not in DKA at that time. He was admitted with acute kidney injury with a creatinine of 5.08 now greater than 6. Nephrology consult at [facility] indicated concern that he is nearing need for hemodialysis.  Today he confirms much of the above information. He states his nausea and vomiting started yesterday. He complains of generalized abdominal pain. Persistent nausea and vomiting which she states medications are not helping. EGD completed in 2015 with severe erosive reflux esophagitis. He states he is taking his Protonix acid blocker at home. He is also on Reglan at home for gastroparesis. He denies  hematochezia, melena, acute bowel habit changes. His main GI concerns are his abdominal pain and nausea/vomiting. Denies hematemesis.  Past Medical History:  Diagnosis Date  . Acute esophagitis   . Anxiety   . Arthritis   . Chronic abdominal pain   . Chronic back pain   . Diabetes mellitus    type 1  . Diabetic gastroparesis associated with type 1 diabetes mellitus ([place])   . Erosive esophagitis 12/23/2013   "severe" per EGD   . Gastroparesis   . GERD (gastroesophageal reflux disease)   . Heart murmur    valvular stenosis  . Hypercholesteremia   . Hypertension   . Insomnia   . Nausea and vomiting    chronic, recurrent  . Neuropathy   . PUD (peptic ulcer disease)   . S/P arthroscopic knee surgery 07/04/2011  . Scoliosis 07/11/2012    Past Surgical History:  Procedure Laterality Date  . BIOPSY  06/17/2013   Procedure: GASTRIC ULCER AND ANTRAL BIOPSIES; ESOPHAGEAL BRUSHING;  Surgeon: Danie Binder, MD;  Location: [facility];  Service: Endoscopy;;  . BIOPSY N/A 12/23/2013   Procedure: ESOPHAGEAL BIOPSY;  Surgeon: Daneil Dolin, MD;  Location: [facility];  Service: Endoscopy;  Laterality: N/A;  . CHOLECYSTECTOMY N/A 01/28/2014   Procedure: LAPAROSCOPIC CHOLECYSTECTOMY;  Surgeon: Jamesetta So, MD;  Location: [facility];  Service: General;  Laterality: N/A;  . ESOPHAGOGASTRODUODENOSCOPY (EGD) WITH PROPOFOL  06/17/2013   Dr. Oneida Alar: two gastric ulcers in fundus, mild antral gastritis, candida esophagitis  . ESOPHAGOGASTRODUODENOSCOPY (EGD) WITH PROPOFOL N/A 09/11/2013   Dr. [name]: abnormal hypopharynx, question massively enlarged tonsils, stomach full of food precluded exam, needs EGD for verification of ulcer healing at a later date  . ESOPHAGOGASTRODUODENOSCOPY (  EGD) WITH PROPOFOL N/A 12/23/2013   RMR: Severe exudative esophagitis likely predominatly reflux related. Superimposed Candida infection not excluded status post KOH brushing and biopsy. Localized excoriating gastric mucosa most  consistant with trauma(vomiting). No evidence of peptic ulcer disease or other gastric/duodenal pathology. I suspect severe inflammation involving the distal esophagus may account  for at least some of patii  . KNEE ARTHROSCOPY  06/30/2011   Procedure: ARTHROSCOPY KNEE;  Surgeon: Carole Civil, MD;  Location: [facility];  Service: Orthopedics;  Laterality: Right;  diagnostic arthroscopy  . TYMPANOSTOMY TUBE PLACEMENT      Prior to Admission medications   Medication Sig Start Date End Date Taking? Authorizing Provider  amLODipine (NORVASC) 10 MG tablet Take 10 mg by mouth daily.  05/08/15  Yes [provider]  aspirin 81 MG tablet Take 81 mg by mouth daily.   Yes [provider]  gabapentin (NEURONTIN) 300 MG capsule Take 300 mg by mouth 2 (two) times daily.    Yes [provider]  Insulin Glargine (BASAGLAR KWIKPEN) 100 UNIT/ML SOPN Inject 20 Units into the skin daily. 05/19/16  Yes [provider]  insulin regular (NOVOLIN R,HUMULIN R) 100 units/mL injection Inject 2-10 Units into the skin 3 (three) times daily before meals. Sliding scale.   Yes [provider]  metoCLOPramide (REGLAN) 10 MG tablet 10 mg 4 (four) times daily -  before meals and at bedtime. 04/26/15  Yes [provider]  metoprolol succinate (TOPROL-XL) 100 MG 24 hr tablet Take 1 tablet (100 mg total) by mouth daily. 08/27/16  Yes Tat, Shanon Brow, MD  oxyCODONE-acetaminophen (PERCOCET) 10-325 MG tablet Take by mouth. 05/20/15  Yes [provider]  pantoprazole (PROTONIX) 40 MG tablet Take 1 tablet (40 mg total) by mouth 2 (two) times daily before a meal. Patient taking differently: Take 40 mg by mouth daily.  04/10/14  Yes Annitta Needs, NP  tiZANidine (ZANAFLEX) 4 MG tablet 4 mg 2 (two) times daily.  05/11/15  Yes [provider]  Vitamin D, Ergocalciferol, (DRISDOL) 50000 units CAPS capsule Take 50,000 Units by mouth every 7 (seven) days.   Yes [provider]     Current Facility-Administered Medications  Medication Dose Route Frequency Provider Last Rate Last Dose  . 0.9 %  sodium chloride infusion  1,000 mL Intravenous Continuous Murlean Iba, MD   Stopped at 11/17/16 1154  . acetaminophen (TYLENOL) tablet 650 mg  650 mg Oral Q6H PRN Johnson, Clanford L, MD       Or  . acetaminophen (TYLENOL) suppository 650 mg  650 mg Rectal Q6H PRN Johnson, Clanford L, MD      . amLODipine (NORVASC) tablet 10 mg  10 mg Oral Daily Johnson, Clanford L, MD   10 mg at 11/17/16 1113  . aspirin EC tablet 81 mg  81 mg Oral Daily Johnson, Clanford L, MD   81 mg at 11/17/16 1112  . bisacodyl (DULCOLAX) EC tablet 5 mg  5 mg Oral Daily PRN Johnson, Clanford L, MD      . docusate sodium (COLACE) capsule 100 mg  100 mg Oral BID Johnson, Clanford L, MD      . gi cocktail (Maalox,Lidocaine,Donnatal)  30 mL Oral TID PRN Johnson, Clanford L, MD   30 mL at 11/17/16 1419  . heparin injection 5,000 Units  5,000 Units Subcutaneous Q8H Johnson, Clanford L, MD      . hydrALAZINE (APRESOLINE) injection 10 mg  10 mg Intravenous Q4H PRN Johnson, Clanford  L, MD      . insulin aspart (novoLOG) injection 0-9 Units  0-9 Units Subcutaneous TID WC Johnson, Clanford L, MD   7 Units at 11/17/16 1220  . insulin aspart (novoLOG) injection 5 Units  5 Units Subcutaneous TID WC Johnson, Clanford L, MD      . insulin glargine (LANTUS) injection 20 Units  20 Units Subcutaneous Daily Johnson, Clanford L, MD   20 Units at 11/17/16 1106  . LORazepam (ATIVAN) tablet 0.5 mg  0.5 mg Oral Q6H PRN Johnson, Clanford L, MD   0.5 mg at 11/17/16 1140  . metoprolol succinate (TOPROL-XL) 24 hr tablet 100 mg  100 mg Oral Daily Johnson, Clanford L, MD   100 mg at 11/17/16 1114  . nicotine (NICODERM CQ - dosed in mg/24 hr) patch 7 mg  7 mg Transdermal Daily Johnson, Clanford L, MD      . ondansetron (ZOFRAN) tablet 4 mg  4 mg Oral Q6H PRN Johnson, Clanford L, MD   4 mg at 11/17/16 1140   Or  . ondansetron  (ZOFRAN) injection 4 mg  4 mg Intravenous Q6H PRN Johnson, Clanford L, MD   4 mg at 11/17/16 0016  . pantoprazole (PROTONIX) injection 40 mg  40 mg Intravenous Q12H Johnson, Clanford L, MD      . polyethylene glycol (MIRALAX / GLYCOLAX) packet 17 g  17 g Oral Daily PRN Johnson, Clanford L, MD      . promethazine (PHENERGAN) injection 12.5 mg  12.5 mg Intravenous Q8H PRN Johnson, Clanford L, MD   12.5 mg at 11/17/16 0015  . Vitamin D (Ergocalciferol) (DRISDOL) capsule 50,000 Units  50,000 Units Oral Q7 days Murlean Iba, MD        Allergies as of 11/16/2016 - Review Complete 11/16/2016  Allergen Reaction Noted  . Hydrocodone Hives 08/23/2013  . Tramadol Anaphylaxis and Other (See Comments) 10/14/2013  . Ibuprofen Other (See Comments) 08/14/2011  . Naproxen Other (See Comments) 08/14/2011  . Sulfa antibiotics Other (See Comments) 08/14/2011  . Morphine and related Rash 01/04/2012    Family History  Problem Relation Age of Onset  . Cancer Father   . Alcohol abuse Father   . Pseudochol deficiency Neg Hx   . Malignant hyperthermia Neg Hx   . Hypotension Neg Hx   . Anesthesia problems Neg Hx   . Colon cancer Neg Hx     Social History   Social History  . Marital status: Single    Spouse name: N/A  . Number of children: N/A  . Years of education: 4   Occupational History  . unemployed Unemployed    trying to get disability   Social History Main Topics  . Smoking status: Current Some Day Smoker    Packs/day: 0.25    Years: 10.00    Types: Cigarettes  . Smokeless tobacco: Former Systems developer    Quit date: 02/17/2013     Comment: smokes 4 cigarettes a day   . Alcohol use No  . Drug use: No  . Sexual activity: Not Currently   Other Topics Concern  . Not on file   Social History Narrative  . No narrative on file    Review of Systems: General: Negative for anorexia, weight loss, fever, chills, fatigue, weakness. ENT: Negative for hoarseness, difficulty swallowing. CV:  Negative for chest pain, angina, palpitations, peripheral edema.  Respiratory: Negative for dyspnea at rest, cough, sputum, wheezing.  GI: See history of present illness. Endo: Negative for unusual weight  change.  Heme: Negative for bruising or bleeding. Allergy: Negative for rash or hives.  Physical Exam: Vital signs in last 24 hours: Temp:  [98.2 F (36.8 C)-98.4 F (36.9 C)] 98.2 F (36.8 C) (10/12 1110) Pulse Rate:  [88-124] 88 (10/12 1110) Resp:  [18] 18 (10/12 1110) BP: (150-165)/(76-101) 150/76 (10/12 1110) SpO2:  [99 %-100 %] 99 % (10/12 1110)   General:   Alert,  Well-developed, well-nourished, pleasant and cooperative in NAD. Appears to be on pain medication during the visit with frequently closing his eyes. Head:  Normocephalic and atraumatic. Eyes:  Sclera clear, no icterus. Conjunctiva pink. Ears:  Normal auditory acuity. Nose:  No deformity, discharge, or lesions. Lungs:  Clear throughout to auscultation. No wheezes, crackles, or rhonchi. No acute distress. Heart:  Regular rate and rhythm; no murmurs, clicks, rubs,  or gallops. Abdomen:  Soft, and nondistended. Generalized abdominal TTP noted. No masses, hepatosplenomegaly or hernias noted. Hypoactive bowel sounds, without guarding, and without rebound.   Rectal:  Deferred.   Msk:  Symmetrical without gross deformities. Pulses:  Normal bilateral DP pulses noted. Extremities:  Without clubbing or edema. Neurologic:  Alert and  oriented x4;  grossly normal neurologically. Psych:  Alert and cooperative. Normal mood and affect.  Intake/Output from previous day: 10/11 0701 - 10/12 0700 In: -  Out: 750 [Urine:750] Intake/Output this shift: No intake/output data recorded.  Lab Results:  Recent Labs  11/16/16 0853 11/17/16 0626  WBC 19.1* 24.9*  HGB 12.1* 11.4*  HCT 35.9* 34.6*  PLT 526* 470*   BMET  Recent Labs  11/16/16 0853 11/16/16 1513 11/16/16 1928 11/17/16 0626  NA 134*  --  130* 136  K 3.4*   --  3.8 3.1*  CL 77*  --  74* 79*  CO2 28  --  21* 33*  GLUCOSE 293*  --  748* 352*  BUN 57*  --  57* 61*  CREATININE 6.37*  --  6.58* 6.28*  CALCIUM 10.4* 9.5 9.3 9.1   LFT  Recent Labs  11/16/16 0853 11/17/16 0626  PROT 9.4* 7.9  ALBUMIN 4.9 4.3  4.2  AST 16 10*  ALT 8* 10*  ALKPHOS 147* 125  BILITOT 1.9* 2.2*   PT/INR No results for input(s): LABPROT, INR in the last 72 hours. Hepatitis Panel No results for input(s): HEPBSAG, HCVAB, HEPAIGM, HEPBIGM in the last 72 hours. C-Diff No results for input(s): CDIFFTOX in the last 72 hours.  Studies/Results: Ct Abdomen Pelvis Wo Contrast  Result Date: 11/17/2016 CLINICAL DATA:  Nausea, vomiting and diarrhea for the past 3 days. Elevated creatinine. EXAM: CT ABDOMEN AND PELVIS WITHOUT CONTRAST TECHNIQUE: Multidetector CT imaging of the abdomen and pelvis was performed following the standard protocol without IV contrast. COMPARISON:  Renal ultrasound dated 11/16/2016. Abdomen and pelvis CT dated 08/10/2016. FINDINGS: Lower chest: Unremarkable. Hepatobiliary: Cholecystectomy clips.  Normal appearing liver. Pancreas: Unremarkable. No pancreatic ductal dilatation or surrounding inflammatory changes. Spleen: Normal in size without focal abnormality. Adrenals/Urinary Tract: Adrenal glands are unremarkable. Kidneys are normal, without renal calculi, focal lesion, or hydronephrosis. Bladder is unremarkable. Stomach/Bowel: Stable small hiatal hernia and diffuse distal esophageal wall thickening. Unremarkable small bowel and colon. No evidence of appendicitis. Vascular/Lymphatic: Mild atheromatous arterial calcifications without aneurysm. Mildly prominent retroperitoneal lymph nodes are again demonstrated. The previously demonstrated 11 mm short axis left para-aortic node has a short axis diameter of 11 mm on image number 36 of series 2. Reproductive: Prostate is unremarkable. Other: No abdominal wall hernia or abnormality. No  abdominopelvic  ascites. Musculoskeletal: Stable mild lower thoracic spine vertebral compression deformities and mild degenerative changes. IMPRESSION: 1. Stable small hiatal hernia with diffuse distal esophageal wall thickening. The wall thickening may be due to reflux esophagitis. 2. Stable mildly prominent retroperitoneal lymph nodes. 3. No acute abnormality. Electronically Signed   By: Claudie Revering M.D.   On: 11/17/2016 07:46   Dg Chest 2 View  Result Date: 11/17/2016 CLINICAL DATA:  Nausea, vomiting, diarrhea for 3 days. EXAM: CHEST  2 VIEW COMPARISON:  11/16/2016 FINDINGS: Mild cardiomegaly. Low lung volumes. Bibasilar atelectasis. Right jugular PICC line is stable. No pneumothorax. No pleural effusion. Stable thoracic spine compare with 02/13/2016 IMPRESSION: Cardiomegaly.  Bibasilar atelectasis. Electronically Signed   By: Marybelle Killings M.D.   On: 11/17/2016 07:39   US Renal  Result Date: 11/16/2016 CLINICAL DATA:  Nausea 3 days. EXAM: RENAL / URINARY TRACT ULTRASOUND COMPLETE COMPARISON:  07/31/2016 FINDINGS: Right Kidney: Length: 11.9 cm. Slight increased cortical echogenicity. No mass or hydronephrosis visualized. Trace perinephric fluid. Left Kidney: Length: 12.6 cm. Slight increased cortical echogenicity. No mass or hydronephrosis visualized. Bladder: Appears normal for degree of bladder distention. IMPRESSION: Normal size kidneys without hydronephrosis. Trace right perinephric fluid. Subtle increased cortical echogenicity which may be seen in medical renal disease. Electronically Signed   By: Marin Olp M.D.   On: 11/16/2016 16:02   Dg Chest Portable 1 View  Result Date: 11/16/2016 CLINICAL DATA:  Body aches.  Diaphoresis. EXAM: PORTABLE CHEST 1 VIEW COMPARISON:  11/07/2016. FINDINGS: Central line noted with tip at cavoatrial junction. Cardiomegaly with mild pulmonary venous congestion. No focal pulmonary infiltrate. No pleural effusion or pneumothorax. IMPRESSION: 1. Central line with tip at  cavoatrial shin. 2. Cardiomegaly with mild pulmonary venous congestion. Electronically Signed   By: Marcello Moores  Register   On: 11/16/2016 09:59    Impression: 31 year old male with medically complex issues. Admitted with dehydration, nausea, vomiting, kidney injury.he describes generalized abdominal pain. States anti-medics so far are not working. However, he does not appear to be actively retching or vomiting. His hemoglobin A1c was mildly uncontrolled at 7.6 yesterday.CBC today shows some anemia at 11.4 in the setting of chronic kidney disease and rehydration with a possible hydration effect. His white blood cell count is significantly elevated at 24.9. Nursing staff has informed me that they are removing his right upper chest catheterand sending the tip for culture.CMP today shows hypokalemia, worsening kidney function with a creatinine of 6.28, elevated bilirubin at 2.2, normal AST/ALT, alkaline phosphatase.  He is tender on exam and has hypoactive bowel sounds. However, a CT of the abdomen and pelvis was completed yesterday which found stable small hiatal hernia with diffuse distal esophageal wall thickening possibly due to reflux esophagitis. This is consistent with his history including EGD in 2015 which noted severe erosive esophagitis.the only NSAID he appears to be on is a daily aspirin at home.  Likely multiple differentials for his intractable nausea and vomiting including uncontrolled diabetes with diabetic gastroparesis, opioid withdrawal given history of opioid abuse and recent discharge from pain management, acute kidney injury, erosive esophagitis. At this point there does not seem to be an indication for urgent endoscopic evaluation, although we will continue to monitor. We will likely approaches from medical management at this time.  Plan: 1. Scheduled Zofran 2. Scheduled Reglan 3. Phenergan every 8 hours prn intractable N/V 4. Scheduled Carafate 5. Monitor H/H 6. Monitor for any  obvious GI bleed 7. Supportive measures   Thank you for allowing  [place] to participate in the care of [place], DNP, AGNP-C Adult & Gerontological Nurse Practitioner [facility]    LOS: 1 day     11/17/2016, 2:21 PM

## 2016-11-17 NOTE — Progress Notes (Signed)
Brent Daniels  MRN: [id number]  DOB/AGE: 09-Jul-1985 31 y.o.  Primary Care Physician:Corrington, Delsa Grana, MD  Admit date: 11/16/2016  Chief Complaint:  Chief Complaint  Patient presents with  . Medical Clearance    S-Pt presented on  11/16/2016 with  Chief Complaint  Patient presents with  . Medical Clearance  .    Pt today feels better  Meds . amLODipine  10 mg Oral Daily  . aspirin EC  81 mg Oral Daily  . docusate sodium  100 mg Oral BID  . heparin  5,000 Units Subcutaneous Q8H  . insulin aspart  0-9 Units Subcutaneous TID WC  . insulin aspart  5 Units Subcutaneous TID WC  . insulin glargine  20 Units Subcutaneous Daily  . metoprolol succinate  100 mg Oral Daily  . nicotine  7 mg Transdermal Daily  . ondansetron (ZOFRAN) IV  4 mg Intravenous Q6H  . pantoprazole (PROTONIX) IV  40 mg Intravenous Q12H  . sucralfate  1 g Oral TID WC & HS  . Vitamin D (Ergocalciferol)  50,000 Units Oral Q7 days      Physical Exam: Vital signs in last 24 hours: Temp:  [98.2 F (36.8 C)-98.7 F (37.1 C)] 98.7 F (37.1 C) (10/12 1300) Pulse Rate:  [88-124] 89 (10/12 1300) Resp:  [18-20] 20 (10/12 1300) BP: (150-165)/(76-93) 163/93 (10/12 1300) SpO2:  [98 %-100 %] 98 % (10/12 1300) Weight change:     Intake/Output from previous day: 10/11 0701 - 10/12 0700 In: -  Out: 750 [Urine:750] No intake/output data recorded.   Physical Exam: General- pt is awake,alert, oriented to time place and person Resp- No acute REsp distress, CTA B/L NO Rhonchi CVS- S1S2 regular ij rate and rhythm GIT- BS+, soft, NT, ND EXT- NO LE Edema, no Cyanosis   Lab Results: CBC  Recent Labs  11/16/16 0853 11/17/16 0626  WBC 19.1* 24.9*  HGB 12.1* 11.4*  HCT 35.9* 34.6*  PLT 526* 470*    BMET  Recent Labs  11/16/16 1928 11/17/16 0626  NA 130* 136  K 3.8 3.1*  CL 74* 79*  CO2 21* 33*  GLUCOSE 748* 352*  BUN 57* 61*  CREATININE 6.58* 6.28*  CALCIUM 9.3 9.1   Creat trend 2018   October  6.58=> 6.25 September at [place]   4.9--5.7 July 3.2--4.6 2016  1.3           MICRO Recent Results (from the past 240 hour(s))  Culture, blood (Routine X 2) w Reflex to ID Panel     Status: None (Preliminary result)   Collection Time: 11/16/16 10:41 AM  Result Value Ref Range Status   Specimen Description BLOOD LEFT HAND  Final   Special Requests   Final    BOTTLES DRAWN AEROBIC ONLY Blood Culture results may not be optimal due to an inadequate volume of blood received in culture bottles   Culture NO GROWTH < 24 HOURS  Final   Report Status PENDING  Incomplete  Culture, blood (Routine X 2) w Reflex to ID Panel     Status: None (Preliminary result)   Collection Time: 11/16/16 10:41 AM  Result Value Ref Range Status   Specimen Description BLOOD RIGHT HAND  Final   Special Requests   Final    BOTTLES DRAWN AEROBIC AND ANAEROBIC Blood Culture adequate volume   Culture NO GROWTH < 24 HOURS  Final   Report Status PENDING  Incomplete      Lab Results  Component Value  Date   PTH 125 (H) 11/16/2016   PTH Comment 11/16/2016   CALCIUM 9.1 11/17/2016   CAION 1.15 03/31/2014   CAION 1.15 03/31/2014   PHOS 5.5 (H) 11/17/2016               Impression: 1)Renal  AKI secondary to Prerenal/ATN/ Hypercalcemia associated afferent vasoconstriction                vs  CKD progression               AKI on CKD               CKD stage 5.               CKD since 2016               CKD secondary to DM 1                Progression of CKD                  as expected for uncontrolled DM 1-Pt with 22 years of DM                 multiple AKI                Proteinura Present.              2)HTN  Medication- On Calcium Channel Blockers On Beta blockers   3)Anemia HGb at goal (9--11)  4)CKD Mineral-Bone Disorder PTH high Secondary Hyperparathyroidismpresent Phosphorus not  at goal. Calcium- better   5)GI- admitted with Nausea and vomiting. Clinically  better Primary MD following  6)Electrolytes Hypokalemic Being replte   Hypoonatremic   Hypovolemic    CKD   Hyperglycemia  7)Acid base Co2 on higher side  Metabolic alkalosis component from vomiting      Plan:   Will continue current care. Will follow bmet and decide about need of renal replacement therapy No acute need of Hd today   Alette Kataoka S 11/17/2016, 3:29 PM

## 2016-11-17 NOTE — Progress Notes (Signed)
Patient PICC removed and intact from right chest, 24cm in length, pressure dressing applied, clean, dry and intact. Patient  Voices no complaints.

## 2016-11-17 NOTE — Progress Notes (Signed)
PROGRESS NOTE    Brent Daniels  XIP:[id number]  DOB: 11-04-1985  DOA: 11/16/2016 PCP: [name], MD   Brief Admission Hx: 31 y.o. male with chronic kidney disease, type 1 diabetes mellitus, poorly controlled, presented to the emergency department by [facility] Department under IVC by his mother. He has been having intractable nausea and vomiting for the past 3 days.  He was admitted with worsening CKD, hyperglycemia, intractable nausea and vomiting.   MDM/Assessment & Plan:   1. Worsening CKD stage V - Pt is being treated with IVF hydration. Nephrology following.     2. Intractable nausea and vomiting-clear liquid diet, IV nausea medicines ordered. IV metoclopramide ordered.  He continues to have severe nausea and emesis. He only tolerates GI cocktail.   Patient was noted in 2015 to have a severe esophagitis. I asked GI to consult.  3. Type 1 diabetes mellitus, poorly controlled with multiple complications-check a hemoglobin A1c, resume basal Lantus 20 units daily, NovoLog 5 units 3 times a day before meals plus supplemental coverage for high blood glucose readings.  Monitor for DKA. He is at high risk for this.  4. GERD-IV pantoprazole ordered. 5. Chronic pain with history of drug diversion and drug-seeking behaviors-avoiding narcotics. I have ordered a urine drug screen. He was not prescribed narcotics for opioids when he was discharged from [facility] in September 2018. He was also discharged from his chronic pain management clinic for drug diversion. He has no physical exam findings to support the degree of abdominal pain that he is describing. We will make every effort to avoid opioids.  He has a benign abdominal exam. 6. Tobacco abuse-nicotine patch ordered for use in the hospital. Ongoing counseling for tobacco cessation will be performed. 7. History of peptic ulcer disease-IV pantoprazole ordered. His EGD from 2015 did not reveal PUD. 8. Essential  hypertension-currently uncontrolled-not taking medications at home, IV hydralazine ordered and will resume home blood pressure medications. 9. Bicuspid aortic valve-noted on TEE performed at [facility] in September of this year. It was recommended that he follow-up with cardiology outpatient for ongoing surveillance. 10. Diabetic neuropathy and gastroparesis-secondary to poorly controlled type 1 diabetes mellitus. Would hold gabapentin in the setting of worsening CK D and possible uremia. IV metoclopramide ordered. GI consultation requested. 11. IVC-rescinded by the emergency department physician as patient is denying suicidal ideation, homicidal ideation and any plans to harm self or others. 12. Anemia of CKD - Hg 12.1, will follow.  Pt had been prescribed iron tablet 325 mg daily by nephrology at [facility].  13. Vitamin D Deficiency - resume weekly vitamin D replacement.  14. Leukocytosis - suspect this is reactive from 3 days of intractable vomiting, no infection found, urinalysis ok, check CT abdomen and repeat CXR. Remove central line - It has been in place x 2 weeks, follow blood cultures.    DVT Prophylaxis: heparin Code Status: Full   Family Communication: none present   Disposition Plan: TBD   Consultants:  Nephrology  GI  Subjective: Pt complain of nausea and abdominal pain. Asking for GI cocktail.   Objective: Vitals:   11/16/16 1428 11/16/16 1637 11/16/16 1754 11/16/16 1755  BP: (!) 158/101 (!) 165/84 (!) 155/86 (!) 155/86  Pulse: (!) 120 (!) 122 (!) 124 (!) 124  Resp: 18 18  18   Temp: 98.4 F (36.9 C) 98.4 F (36.9 C)  98.4 F (36.9 C)  TempSrc: Oral Oral  Oral  SpO2: 100% 99%  100%  Weight:      Height:        Intake/Output Summary (Last 24 hours) at 11/17/16 1030 Last data filed at 11/16/16 1835  Gross per 24 hour  Intake                0 ml  Output              750 ml  Net             -750 ml   Filed Weights   11/16/16 6834  Weight: 83.9 kg  (185 lb)     REVIEW OF SYSTEMS  As per history otherwise all reviewed and reported negative  Exam:  General exam: chronically ill appearing in mild distress. Foul body odor.  Respiratory system: Clear. No increased work of breathing. Cardiovascular system: S1 & S2 heard, tachycardic. Gastrointestinal system: Abdomen is nondistended, soft and nontender. Normal bowel sounds heard. Central nervous system: Alert and oriented. No focal neurological deficits. Extremities: no CCE.   Data Reviewed: Basic Metabolic Panel:  Recent Labs Lab 11/16/16 0853 11/16/16 1227 11/16/16 1513 11/16/16 1928 11/17/16 0626  NA 134*  --   --  130* 136  K 3.4*  --   --  3.8 3.1*  CL 77*  --   --  74* 79*  CO2 28  --   --  21* 33*  GLUCOSE 293*  --   --  748* 352*  BUN 57*  --   --  57* 61*  CREATININE 6.37*  --   --  6.58* 6.28*  CALCIUM 10.4*  --  9.5 9.3 9.1  MG  --   --   --   --  2.8*  PHOS  --  3.7  --   --  5.5*   Liver Function Tests:  Recent Labs Lab 11/16/16 0853 11/17/16 0626  AST 16 10*  ALT 8* 10*  ALKPHOS 147* 125  BILITOT 1.9* 2.2*  PROT 9.4* 7.9  ALBUMIN 4.9 4.3  4.2   No results for input(s): LIPASE, AMYLASE in the last 168 hours. No results for input(s): AMMONIA in the last 168 hours. CBC:  Recent Labs Lab 11/16/16 0853 11/17/16 0626  WBC 19.1* 24.9*  HGB 12.1* 11.4*  HCT 35.9* 34.6*  MCV 85.7 86.9  PLT 526* 470*   Cardiac Enzymes: No results for input(s): CKTOTAL, CKMB, CKMBINDEX, TROPONINI in the last 168 hours. CBG (last 3)   Recent Labs  11/16/16 1940 11/16/16 2125 11/17/16 0749  GLUCAP >600* 480* 356*   Recent Results (from the past 240 hour(s))  Culture, blood (Routine X 2) w Reflex to ID Panel     Status: None (Preliminary result)   Collection Time: 11/16/16 10:41 AM  Result Value Ref Range Status   Specimen Description BLOOD LEFT HAND  Final   Special Requests   Final    BOTTLES DRAWN AEROBIC ONLY Blood Culture results may not be  optimal due to an inadequate volume of blood received in culture bottles   Culture NO GROWTH < 24 HOURS  Final   Report Status PENDING  Incomplete  Culture, blood (Routine X 2) w Reflex to ID Panel     Status: None (Preliminary result)   Collection Time: 11/16/16 10:41 AM  Result Value Ref Range Status   Specimen Description BLOOD RIGHT HAND  Final   Special Requests   Final    BOTTLES DRAWN AEROBIC AND ANAEROBIC Blood Culture adequate volume   Culture NO GROWTH <  24 HOURS  Final   Report Status PENDING  Incomplete     Studies: Ct Abdomen Pelvis Wo Contrast  Result Date: 11/17/2016 CLINICAL DATA:  Nausea, vomiting and diarrhea for the past 3 days. Elevated creatinine. EXAM: CT ABDOMEN AND PELVIS WITHOUT CONTRAST TECHNIQUE: Multidetector CT imaging of the abdomen and pelvis was performed following the standard protocol without IV contrast. COMPARISON:  Renal ultrasound dated 11/16/2016. Abdomen and pelvis CT dated 08/10/2016. FINDINGS: Lower chest: Unremarkable. Hepatobiliary: Cholecystectomy clips.  Normal appearing liver. Pancreas: Unremarkable. No pancreatic ductal dilatation or surrounding inflammatory changes. Spleen: Normal in size without focal abnormality. Adrenals/Urinary Tract: Adrenal glands are unremarkable. Kidneys are normal, without renal calculi, focal lesion, or hydronephrosis. Bladder is unremarkable. Stomach/Bowel: Stable small hiatal hernia and diffuse distal esophageal wall thickening. Unremarkable small bowel and colon. No evidence of appendicitis. Vascular/Lymphatic: Mild atheromatous arterial calcifications without aneurysm. Mildly prominent retroperitoneal lymph nodes are again demonstrated. The previously demonstrated 11 mm short axis left para-aortic node has a short axis diameter of 11 mm on image number 36 of series 2. Reproductive: Prostate is unremarkable. Other: No abdominal wall hernia or abnormality. No abdominopelvic ascites. Musculoskeletal: Stable mild lower  thoracic spine vertebral compression deformities and mild degenerative changes. IMPRESSION: 1. Stable small hiatal hernia with diffuse distal esophageal wall thickening. The wall thickening may be due to reflux esophagitis. 2. Stable mildly prominent retroperitoneal lymph nodes. 3. No acute abnormality. Electronically Signed   By: Claudie Revering M.D.   On: 11/17/2016 07:46   Dg Chest 2 View  Result Date: 11/17/2016 CLINICAL DATA:  Nausea, vomiting, diarrhea for 3 days. EXAM: CHEST  2 VIEW COMPARISON:  11/16/2016 FINDINGS: Mild cardiomegaly. Low lung volumes. Bibasilar atelectasis. Right jugular PICC line is stable. No pneumothorax. No pleural effusion. Stable thoracic spine compare with 02/13/2016 IMPRESSION: Cardiomegaly.  Bibasilar atelectasis. Electronically Signed   By: Marybelle Killings M.D.   On: 11/17/2016 07:39   US Renal  Result Date: 11/16/2016 CLINICAL DATA:  Nausea 3 days. EXAM: RENAL / URINARY TRACT ULTRASOUND COMPLETE COMPARISON:  07/31/2016 FINDINGS: Right Kidney: Length: 11.9 cm. Slight increased cortical echogenicity. No mass or hydronephrosis visualized. Trace perinephric fluid. Left Kidney: Length: 12.6 cm. Slight increased cortical echogenicity. No mass or hydronephrosis visualized. Bladder: Appears normal for degree of bladder distention. IMPRESSION: Normal size kidneys without hydronephrosis. Trace right perinephric fluid. Subtle increased cortical echogenicity which may be seen in medical renal disease. Electronically Signed   By: Marin Olp M.D.   On: 11/16/2016 16:02   Dg Chest Portable 1 View  Result Date: 11/16/2016 CLINICAL DATA:  Body aches.  Diaphoresis. EXAM: PORTABLE CHEST 1 VIEW COMPARISON:  11/07/2016. FINDINGS: Central line noted with tip at cavoatrial junction. Cardiomegaly with mild pulmonary venous congestion. No focal pulmonary infiltrate. No pleural effusion or pneumothorax. IMPRESSION: 1. Central line with tip at cavoatrial shin. 2. Cardiomegaly with mild pulmonary  venous congestion. Electronically Signed   By: [place]   On: 11/16/2016 09:59     Scheduled Meds: . amLODipine  10 mg Oral Daily  . aspirin EC  81 mg Oral Daily  . docusate sodium  100 mg Oral BID  . heparin  5,000 Units Subcutaneous Q8H  . insulin aspart  0-9 Units Subcutaneous TID WC  . insulin aspart  5 Units Subcutaneous TID WC  . insulin glargine  20 Units Subcutaneous Daily  . metoprolol succinate  100 mg Oral Daily  . nicotine  7 mg Transdermal Daily  . pantoprazole (PROTONIX) IV  40  mg Intravenous Q24H  . Vitamin D (Ergocalciferol)  50,000 Units Oral Q7 days   Continuous Infusions: . sodium chloride Stopped (11/16/16 1131)    Principal Problem:   Uremia Active Problems:   Uncontrolled type 1 diabetes mellitus with hyperglycemia, with long-term current use of insulin (HCC)   CKD (chronic kidney disease), stage V (HCC)   Essential hypertension   GERD (gastroesophageal reflux disease)   Intractable nausea and vomiting   Chronic generalized abdominal pain   Leukocytosis   PUD (peptic ulcer disease)   Hyperglycemia   Gastroparesis   Drug abuse ([place])   Tobacco use   HLD (hyperlipidemia)   Hyperglycemia due to type 1 diabetes mellitus ([place])   Neuropathy   Vitamin D deficiency  Time spent:   Irwin Brakeman, MD, FAAFP [facility] Pager [phone]  If 7PM-7AM, please contact night-coverage [email or web address] Password [email or web address] 11/17/2016, 10:30 AM    LOS: 1 day

## 2016-11-18 DIAGNOSIS — N179 Acute kidney failure, unspecified: Secondary | ICD-10-CM

## 2016-11-18 DIAGNOSIS — R112 Nausea with vomiting, unspecified: Secondary | ICD-10-CM

## 2016-11-18 DIAGNOSIS — K3184 Gastroparesis: Secondary | ICD-10-CM

## 2016-11-18 LAB — GLUCOSE, CAPILLARY
GLUCOSE-CAPILLARY: 239 mg/dL — AB (ref 65–99)
GLUCOSE-CAPILLARY: 168 mg/dL — AB (ref 65–99)
GLUCOSE-CAPILLARY: 153 mg/dL — AB (ref 65–99)
GLUCOSE-CAPILLARY: 260 mg/dL — AB (ref 65–99)

## 2016-11-18 LAB — COMPREHENSIVE METABOLIC PANEL
ALT: 9 U/L — AB (ref 17–63)
AST: 15 U/L (ref 15–41)
Albumin: 3.5 g/dL (ref 3.5–5.0)
Alkaline Phosphatase: 106 U/L (ref 38–126)
Anion gap: 14 (ref 5–15)
BILIRUBIN TOTAL: 1.8 mg/dL — AB (ref 0.3–1.2)
BUN: 45 mg/dL — AB (ref 6–20)
CO2: 31 mmol/L (ref 22–32)
CREATININE: 4.54 mg/dL — AB (ref 0.61–1.24)
Calcium: 8.7 mg/dL — ABNORMAL LOW (ref 8.9–10.3)
Chloride: 79 mmol/L — ABNORMAL LOW (ref 101–111)
GFR calc Af Amer: 18 mL/min — ABNORMAL LOW (ref >60)
GFR, EST NON AFRICAN AMERICAN: 16 mL/min — AB (ref >60)
GLUCOSE: 233 mg/dL — AB (ref 65–99)
Potassium: 2.8 mmol/L — ABNORMAL LOW (ref 3.5–5.1)
Sodium: 124 mmol/L — ABNORMAL LOW (ref 135–145)
TOTAL PROTEIN: 6.5 g/dL (ref 6.5–8.1)

## 2016-11-18 LAB — BASIC METABOLIC PANEL
Anion gap: 13 (ref 5–15)
BUN: 40 mg/dL — ABNORMAL HIGH (ref 6–20)
CO2: 32 mmol/L (ref 22–32)
Calcium: 9.1 mg/dL (ref 8.9–10.3)
Chloride: 84 mmol/L — ABNORMAL LOW (ref 101–111)
Creatinine, Ser: 4.24 mg/dL — ABNORMAL HIGH (ref 0.61–1.24)
GFR calc Af Amer: 20 mL/min — ABNORMAL LOW (ref >60)
GFR calc non Af Amer: 17 mL/min — ABNORMAL LOW (ref >60)
Glucose, Bld: 145 mg/dL — ABNORMAL HIGH (ref 65–99)
Potassium: 2.7 mmol/L — CL (ref 3.5–5.1)
Sodium: 129 mmol/L — ABNORMAL LOW (ref 135–145)

## 2016-11-18 LAB — CBC WITH DIFFERENTIAL/PLATELET
BASOS PCT: 0 %
Basophils Absolute: 0 10*3/uL (ref 0.0–0.1)
EOS ABS: 0.1 10*3/uL (ref 0.0–0.7)
Eosinophils Relative: 0 %
HCT: 29.6 % — ABNORMAL LOW (ref 39.0–52.0)
HEMOGLOBIN: 10.2 g/dL — AB (ref 13.0–17.0)
LYMPHS ABS: 1.7 10*3/uL (ref 0.7–4.0)
Lymphocytes Relative: 9 %
MCH: 29.1 pg (ref 26.0–34.0)
MCHC: 34.5 g/dL (ref 30.0–36.0)
MCV: 84.3 fL (ref 78.0–100.0)
MONO ABS: 1.2 10*3/uL — AB (ref 0.1–1.0)
Monocytes Relative: 7 %
NEUTROS ABS: 15.3 10*3/uL — AB (ref 1.7–7.7)
Neutrophils Relative %: 84 %
PLATELETS: 307 10*3/uL (ref 150–400)
RBC: 3.51 MIL/uL — AB (ref 4.22–5.81)
RDW: 14.6 % (ref 11.5–15.5)
WBC: 18.3 10*3/uL — ABNORMAL HIGH (ref 4.0–10.5)

## 2016-11-18 LAB — MAGNESIUM: MAGNESIUM: 2 mg/dL (ref 1.7–2.4)

## 2016-11-18 LAB — PHOSPHORUS: Phosphorus: 3.6 mg/dL (ref 2.5–4.6)

## 2016-11-18 MED ORDER — POTASSIUM CHLORIDE 10 MEQ/100ML IV SOLN
10.0000 meq | INTRAVENOUS | Status: AC
  Administered 2016-11-18 (×6): 10 meq via INTRAVENOUS
  Filled 2016-11-18 (×5): qty 100

## 2016-11-18 MED ORDER — SODIUM CHLORIDE 0.9 % IV SOLN
1000.0000 mL | INTRAVENOUS | Status: DC
Start: 2016-11-18 — End: 2016-11-19
  Administered 2016-11-18 – 2016-11-19 (×2): 1000 mL via INTRAVENOUS

## 2016-11-18 MED ORDER — HYDRALAZINE HCL 20 MG/ML IJ SOLN: 10.0000 mg | INTRAMUSCULAR | Status: DC | PRN

## 2016-11-18 MED ORDER — ZOLPIDEM TARTRATE 5 MG PO TABS
5.0000 mg | ORAL_TABLET | Freq: Every evening | ORAL | Status: DC | PRN
  Administered 2016-11-18: 5 mg via ORAL
  Filled 2016-11-18: qty 1

## 2016-11-18 MED ORDER — POTASSIUM CHLORIDE CRYS ER 20 MEQ PO TBCR: 60.0000 meq | EXTENDED_RELEASE_TABLET | Freq: Once | ORAL | Status: DC

## 2016-11-18 MED ORDER — POTASSIUM CHLORIDE CRYS ER 20 MEQ PO TBCR
60.0000 meq | EXTENDED_RELEASE_TABLET | Freq: Once | ORAL | Status: DC
  Filled 2016-11-18: qty 3

## 2016-11-18 MED ORDER — GI COCKTAIL ~~LOC~~
30.0000 mL | Freq: Once | ORAL | Status: AC
  Administered 2016-11-18: 30 mL via ORAL
  Filled 2016-11-18: qty 30

## 2016-11-18 MED ORDER — ONDANSETRON HCL 4 MG/2ML IJ SOLN
4.0000 mg | Freq: Four times a day (QID) | INTRAMUSCULAR | Status: DC | PRN
  Administered 2016-11-18: 4 mg via INTRAVENOUS
  Filled 2016-11-18: qty 2

## 2016-11-18 MED ORDER — DIPHENHYDRAMINE HCL 12.5 MG/5ML PO ELIX
12.5000 mg | ORAL_SOLUTION | Freq: Three times a day (TID) | ORAL | Status: DC | PRN
  Administered 2016-11-18: 12.5 mg via ORAL
  Filled 2016-11-18: qty 5

## 2016-11-18 MED ORDER — ALUM & MAG HYDROXIDE-SIMETH 200-200-20 MG/5ML PO SUSP
15.0000 mL | Freq: Four times a day (QID) | ORAL | Status: DC | PRN
Start: 2016-11-18 — End: 2016-11-19
  Administered 2016-11-18: 15 mL via ORAL
  Filled 2016-11-18: qty 30

## 2016-11-18 NOTE — Progress Notes (Signed)
PROGRESS NOTE    Brent Daniels  HTD:[id number]  DOB: [birth year]  DOA: 11/16/2016 PCP: [name], MD   Brief Admission Hx: 31 y.o. male with chronic kidney disease, type 1 diabetes mellitus, poorly controlled, presented to the emergency department by [facility] Department under IVC by his mother. He has been having intractable nausea and vomiting for the past 3 days.  He was admitted with worsening CKD, hyperglycemia, intractable nausea and vomiting.   MDM/Assessment & Plan:   1. Worsening CKD stage V - Pt is being treated with IVF hydration. Nephrology following. Creatinine trending down and symptoms improving.     2. Intractable nausea and vomiting-symptoms improving, advance to full liquid diet, IV nausea medicines ordered. IV metoclopramide ordered.  Appreciate GI assistance.  3. Type 1 diabetes mellitus, poorly controlled with multiple complications-check a hemoglobin A1c, resume basal Lantus 20 units daily, NovoLog 5 units 3 times a day before meals plus supplemental coverage for high blood glucose readings.  Monitor for DKA. He is at high risk for this.  4. GERD-IV pantoprazole ordered. 5. Chronic pain with history of drug diversion and drug-seeking behaviors-avoiding narcotics. I have ordered a urine drug screen. He was not prescribed narcotics for opioids when he was discharged from [facility] in September 2018. He was also discharged from his chronic pain management clinic for drug diversion. He has no physical exam findings to support the degree of abdominal pain that he is describing. We will make every effort to avoid opioids.  He has a benign abdominal exam. 6. Tobacco abuse-nicotine patch ordered for use in the hospital. Ongoing counseling for tobacco cessation will be performed. 7. History of peptic ulcer disease-IV pantoprazole ordered. His EGD from 2015 did not reveal PUD. 8. Essential hypertension-currently uncontrolled-not taking medications at home, IV  hydralazine ordered and will resume home blood pressure medications. 9. Bicuspid aortic valve-noted on TEE performed at [facility] in September of this year. It was recommended that he follow-up with cardiology outpatient for ongoing surveillance. 10. Diabetic neuropathy and gastroparesis-secondary to poorly controlled type 1 diabetes mellitus. Would hold gabapentin in the setting of worsening CKD and possible uremia.  metoclopramide ordered. GI consultation requested. 11. IVC-rescinded by the emergency department physician as patient is denying suicidal ideation, homicidal ideation and any plans to harm self or others. 12. Anemia of CKD - Hg 12.1, will follow.  Pt had been prescribed iron tablet 325 mg daily by nephrology at [facility].  13. Vitamin D Deficiency - resume weekly vitamin D replacement.  14. Leukocytosis - suspect this is reactive from 3 days of intractable vomiting, no infection found, urinalysis ok, check CT abdomen and repeat CXR. Removed central line - It has been in place x 2 weeks, follow blood cultures no growth to date, cath tip culture pending.    DVT Prophylaxis: heparin Code Status: Full   Family Communication: none present   Disposition Plan: TBD   Consultants:  Nephrology  GI  Subjective: Pt says that he is starting to feel better today.   Less nausea.    Objective: Vitals:   11/17/16 1110 11/17/16 1300 11/17/16 2143 11/18/16 0500  BP: (!) 150/76 (!) 163/93 (!) 156/82 (!) 161/87  Pulse: 88 89 92 90  Resp: 18 20 18 16   Temp: 98.2 F (36.8 C) 98.7 F (37.1 C) 98.5 F (36.9 C)   TempSrc: Oral Oral Oral   SpO2: 99% 98% 99% 95%  Weight:      Height:  Intake/Output Summary (Last 24 hours) at 11/18/16 1037 Last data filed at 11/17/16 2200  Gross per 24 hour  Intake           2792.5 ml  Output                0 ml  Net           2792.5 ml   Filed Weights   11/16/16 3875  Weight: 83.9 kg (185 lb)     REVIEW OF SYSTEMS  As per  history otherwise all reviewed and reported negative  Exam:  General exam: chronically ill appearing in mild distress. Foul body odor.  Respiratory system: Clear. No increased work of breathing. Cardiovascular system: S1 & S2 heard, tachycardic. Gastrointestinal system: Abdomen is nondistended, soft and nontender. Normal bowel sounds heard. Central nervous system: Alert and oriented. No focal neurological deficits. Extremities: no CCE.   Data Reviewed: Basic Metabolic Panel:  Recent Labs Lab 11/16/16 0853 11/16/16 1227 11/16/16 1513 11/16/16 1928 11/17/16 0626 11/18/16 0753  NA 134*  --   --  130* 136 124*  K 3.4*  --   --  3.8 3.1* 2.8*  CL 77*  --   --  74* 79* 79*  CO2 28  --   --  21* 33* 31  GLUCOSE 293*  --   --  748* 352* 233*  BUN 57*  --   --  57* 61* 45*  CREATININE 6.37*  --   --  6.58* 6.28* 4.54*  CALCIUM 10.4*  --  9.5 9.3 9.1 8.7*  MG  --   --   --   --  2.8* 2.0  PHOS  --  3.7  --   --  5.5* 3.6   Liver Function Tests:  Recent Labs Lab 11/16/16 0853 11/17/16 0626 11/18/16 0753  AST 16 10* 15  ALT 8* 10* 9*  ALKPHOS 147* 125 106  BILITOT 1.9* 2.2* 1.8*  PROT 9.4* 7.9 6.5  ALBUMIN 4.9 4.3  4.2 3.5   No results for input(s): LIPASE, AMYLASE in the last 168 hours. No results for input(s): AMMONIA in the last 168 hours. CBC:  Recent Labs Lab 11/16/16 0853 11/17/16 0626 11/18/16 0753  WBC 19.1* 24.9* 18.3*  NEUTROABS  --   --  15.3*  HGB 12.1* 11.4* 10.2*  HCT 35.9* 34.6* 29.6*  MCV 85.7 86.9 84.3  PLT 526* 470* 307   Cardiac Enzymes: No results for input(s): CKTOTAL, CKMB, CKMBINDEX, TROPONINI in the last 168 hours. CBG (last 3)   Recent Labs  11/17/16 1603 11/17/16 2128 11/18/16 0718  GLUCAP 222* 142* 239*   Recent Results (from the past 240 hour(s))  Culture, blood (Routine X 2) w Reflex to ID Panel     Status: None (Preliminary result)   Collection Time: 11/16/16 10:41 AM  Result Value Ref Range Status   Specimen Description  BLOOD LEFT HAND  Final   Special Requests   Final    BOTTLES DRAWN AEROBIC ONLY Blood Culture results may not be optimal due to an inadequate volume of blood received in culture bottles   Culture NO GROWTH < 24 HOURS  Final   Report Status PENDING  Incomplete  Culture, blood (Routine X 2) w Reflex to ID Panel     Status: None (Preliminary result)   Collection Time: 11/16/16 10:41 AM  Result Value Ref Range Status   Specimen Description BLOOD RIGHT HAND  Final   Special Requests   Final  BOTTLES DRAWN AEROBIC AND ANAEROBIC Blood Culture adequate volume   Culture NO GROWTH < 24 HOURS  Final   Report Status PENDING  Incomplete     Studies: Ct Abdomen Pelvis Wo Contrast  Result Date: 11/17/2016 CLINICAL DATA:  Nausea, vomiting and diarrhea for the past 3 days. Elevated creatinine. EXAM: CT ABDOMEN AND PELVIS WITHOUT CONTRAST TECHNIQUE: Multidetector CT imaging of the abdomen and pelvis was performed following the standard protocol without IV contrast. COMPARISON:  Renal ultrasound dated 11/16/2016. Abdomen and pelvis CT dated 08/10/2016. FINDINGS: Lower chest: Unremarkable. Hepatobiliary: Cholecystectomy clips.  Normal appearing liver. Pancreas: Unremarkable. No pancreatic ductal dilatation or surrounding inflammatory changes. Spleen: Normal in size without focal abnormality. Adrenals/Urinary Tract: Adrenal glands are unremarkable. Kidneys are normal, without renal calculi, focal lesion, or hydronephrosis. Bladder is unremarkable. Stomach/Bowel: Stable small hiatal hernia and diffuse distal esophageal wall thickening. Unremarkable small bowel and colon. No evidence of appendicitis. Vascular/Lymphatic: Mild atheromatous arterial calcifications without aneurysm. Mildly prominent retroperitoneal lymph nodes are again demonstrated. The previously demonstrated 11 mm short axis left para-aortic node has a short axis diameter of 11 mm on image number 36 of series 2. Reproductive: Prostate is unremarkable.  Other: No abdominal wall hernia or abnormality. No abdominopelvic ascites. Musculoskeletal: Stable mild lower thoracic spine vertebral compression deformities and mild degenerative changes. IMPRESSION: 1. Stable small hiatal hernia with diffuse distal esophageal wall thickening. The wall thickening may be due to reflux esophagitis. 2. Stable mildly prominent retroperitoneal lymph nodes. 3. No acute abnormality. Electronically Signed   By: Claudie Revering M.D.   On: 11/17/2016 07:46   Dg Chest 2 View  Result Date: 11/17/2016 CLINICAL DATA:  Nausea, vomiting, diarrhea for 3 days. EXAM: CHEST  2 VIEW COMPARISON:  11/16/2016 FINDINGS: Mild cardiomegaly. Low lung volumes. Bibasilar atelectasis. Right jugular PICC line is stable. No pneumothorax. No pleural effusion. Stable thoracic spine compare with 02/13/2016 IMPRESSION: Cardiomegaly.  Bibasilar atelectasis. Electronically Signed   By: Marybelle Killings M.D.   On: 11/17/2016 07:39   US Renal  Result Date: 11/16/2016 CLINICAL DATA:  Nausea 3 days. EXAM: RENAL / URINARY TRACT ULTRASOUND COMPLETE COMPARISON:  07/31/2016 FINDINGS: Right Kidney: Length: 11.9 cm. Slight increased cortical echogenicity. No mass or hydronephrosis visualized. Trace perinephric fluid. Left Kidney: Length: 12.6 cm. Slight increased cortical echogenicity. No mass or hydronephrosis visualized. Bladder: Appears normal for degree of bladder distention. IMPRESSION: Normal size kidneys without hydronephrosis. Trace right perinephric fluid. Subtle increased cortical echogenicity which may be seen in medical renal disease. Electronically Signed   By: Marin Olp M.D.   On: 11/16/2016 16:02   Scheduled Meds: . amLODipine  10 mg Oral Daily  . aspirin EC  81 mg Oral Daily  . docusate sodium  100 mg Oral BID  . heparin  5,000 Units Subcutaneous Q8H  . insulin aspart  0-9 Units Subcutaneous TID WC  . insulin aspart  5 Units Subcutaneous TID WC  . insulin glargine  20 Units Subcutaneous Daily  .  metoprolol succinate  100 mg Oral Daily  . nicotine  7 mg Transdermal Daily  . ondansetron (ZOFRAN) IV  4 mg Intravenous Q6H  . pantoprazole (PROTONIX) IV  40 mg Intravenous Q12H  . potassium chloride  60 mEq Oral Once  . sucralfate  1 g Oral TID WC & HS  . Vitamin D (Ergocalciferol)  50,000 Units Oral Q7 days   Continuous Infusions: . sodium chloride 1,000 mL (11/17/16 2315)    Principal Problem:   Uremia Active Problems:  Uncontrolled type 1 diabetes mellitus with hyperglycemia, with long-term current use of insulin (HCC)   CKD (chronic kidney disease), stage V (HCC)   Essential hypertension   GERD (gastroesophageal reflux disease)   Intractable nausea and vomiting   Chronic generalized abdominal pain   Leukocytosis   PUD (peptic ulcer disease)   Hyperglycemia   Gastroparesis   Drug abuse ([place])   Tobacco use   HLD (hyperlipidemia)   Hyperglycemia due to type 1 diabetes mellitus (HCC)   Neuropathy   Vitamin D deficiency   Acute renal failure (HCC)   ATN (acute tubular necrosis) (HCC)   Erosive esophagitis  Time spent:   Irwin Brakeman, MD, FAAFP [facility] Pager [phone]  If 7PM-7AM, please contact night-coverage [email or web address] Password [email or web address] 11/18/2016, 10:37 AM    LOS: 2 days

## 2016-11-18 NOTE — Progress Notes (Signed)
Brent Daniels  MRN: [id number]  DOB/AGE: 03/12/1985 31 y.o.  Primary Care Physician:Corrington, Delsa Grana, MD  Admit date: 11/16/2016  Chief Complaint:  Chief Complaint  Patient presents with  . Medical Clearance    S-Pt presented on  11/16/2016 with  Chief Complaint  Patient presents with  . Medical Clearance  .    Pt is sleepy after the medications.pt voices no new concerns.    Meds . amLODipine  10 mg Oral Daily  . aspirin EC  81 mg Oral Daily  . docusate sodium  100 mg Oral BID  . heparin  5,000 Units Subcutaneous Q8H  . insulin aspart  0-9 Units Subcutaneous TID WC  . insulin aspart  5 Units Subcutaneous TID WC  . insulin glargine  20 Units Subcutaneous Daily  . metoprolol succinate  100 mg Oral Daily  . nicotine  7 mg Transdermal Daily  . pantoprazole (PROTONIX) IV  40 mg Intravenous Q12H  . potassium chloride  60 mEq Oral Once  . sucralfate  1 g Oral TID WC & HS  . Vitamin D (Ergocalciferol)  50,000 Units Oral Q7 days      Physical Exam: Vital signs in last 24 hours: Temp:  [98.5 F (36.9 C)] 98.5 F (36.9 C) (10/12 2143) Pulse Rate:  [90-92] 90 (10/13 0500) Resp:  [16-18] 16 (10/13 0500) BP: (156-161)/(82-87) 161/87 (10/13 0500) SpO2:  [95 %-99 %] 95 % (10/13 0500) Weight change:     Intake/Output from previous day: 10/12 0701 - 10/13 0700 In: 2792.5 [P.O.:2640; I.V.:152.5] Out: -  Total I/O In: 480 [P.O.:480] Out: -    Physical Exam: General- pt is lethargic/sleepy after phnergan. Resp- No acute REsp distress, NO Rhonchi CVS- S1S2 regular ij rate and rhythm GIT- BS+, soft, NT, ND EXT- NO LE Edema, no Cyanosis   Lab Results: CBC  Recent Labs  11/17/16 0626 11/18/16 0753  WBC 24.9* 18.3*  HGB 11.4* 10.2*  HCT 34.6* 29.6*  PLT 470* 307    BMET  Recent Labs  11/17/16 0626 11/18/16 0753  NA 136 124*  K 3.1* 2.8*  CL 79* 79*  CO2 33* 31  GLUCOSE 352* 233*  BUN 61* 45*  CREATININE 6.28* 4.54*  CALCIUM 9.1 8.7*   Creat  trend 2018  October  6.58=> 6.25=>4.54 September at [facility]   4.9--5.7 July 3.2--4.6 2016  1.3           MICRO Recent Results (from the past 240 hour(s))  Culture, blood (Routine X 2) w Reflex to ID Panel     Status: None (Preliminary result)   Collection Time: 11/16/16 10:41 AM  Result Value Ref Range Status   Specimen Description BLOOD LEFT HAND  Final   Special Requests   Final    BOTTLES DRAWN AEROBIC ONLY Blood Culture results may not be optimal due to an inadequate volume of blood received in culture bottles   Culture NO GROWTH < 24 HOURS  Final   Report Status PENDING  Incomplete  Culture, blood (Routine X 2) w Reflex to ID Panel     Status: None (Preliminary result)   Collection Time: 11/16/16 10:41 AM  Result Value Ref Range Status   Specimen Description BLOOD RIGHT HAND  Final   Special Requests   Final    BOTTLES DRAWN AEROBIC AND ANAEROBIC Blood Culture adequate volume   Culture NO GROWTH < 24 HOURS  Final   Report Status PENDING  Incomplete      Lab Results  Component Value Date   PTH 125 (H) 11/16/2016   PTH Comment 11/16/2016   CALCIUM 8.7 (L) 11/18/2016   CAION 1.15 03/31/2014   CAION 1.15 03/31/2014   PHOS 3.6 11/18/2016               Impression: 1)Renal  AKI secondary to Prerenal/ATN/ Hypercalcemia associated afferent vasoconstriction                     AKI on CKD               CKD stage 5.               CKD since 2016               CKD secondary to DM 1                Progression of CKD                  as expected for uncontrolled DM 1-Pt with 22 years of DM                 multiple AKI                Proteinura Present.                             AKI now better  2)HTN  Medication- On Calcium Channel Blockers On Beta blockers   3)Anemia HGb at goal (9--11)  4)CKD Mineral-Bone Disorder PTH high Secondary Hyperparathyroidismpresent Phosphorus not  at goal. Calcium- better   5)GI- admitted with Nausea and  vomiting. Clinically better Primary MD following  6)Electrolytes Hypokalemic Being replte   Hypoonatremic   Hypovolemic    CKD   Hyperglycemia   Nausea   7)Acid base Co2 at goal     Plan:  Will suggest to repeat BMET in 4 hours to see sodium trend If Na any lower, may need to start lasix  Will continue current care. No acute need of Hd today   Damion Kant S 11/18/2016, 1:10 PM

## 2016-11-18 NOTE — Progress Notes (Signed)
11/18/2016 5:08 PM  I was never notified that the patient didn't get the morning dose of potassium that was ordered.  His potassium is still low now. Will order IV doses.   Murvin Natal MD

## 2016-11-18 NOTE — Progress Notes (Signed)
CRITICAL VALUE ALERT  Critical Value:  Potassium 2.7  Date & Time Notied:  11/18/16 1704  Provider Notified: Wynetta Emery   Orders Received/Actions taken:

## 2016-11-18 NOTE — Progress Notes (Signed)
  Subjective:  Patient denies nausea or vomiting. He is drinking carbonated drinks without any difficulty. He has not experienced nausea or vomiting today. He wants his diet to be advanced.he is requesting pain medication. He was told that this will be determined by Dr. Wynetta Emery.  Objective: Blood pressure (!) 161/87, pulse 90, temperature 98.5 F (36.9 C), temperature source Oral, resp. rate 16, height 6\' 1"  (1.854 m), weight 185 lb (83.9 kg), SpO2 95 %.  Patient is lying on his right side. He is alert and in no acute distress. He does not have tremors. He was examined while lying on his right side.Bowel sounds are normal. Abdomen is non-tender.  There are 13 cans of carbonated drink on the site stable.  Labs/studies Results:   Recent Labs  11/16/16 0853 11/17/16 0626 11/18/16 0753  WBC 19.1* 24.9* 18.3*  HGB 12.1* 11.4* 10.2*  HCT 35.9* 34.6* 29.6*  PLT 526* 470* 307    BMET   Recent Labs  11/16/16 1928 11/17/16 0626 11/18/16 0753  NA 130* 136 124*  K 3.8 3.1* 2.8*  CL 74* 79* 79*  CO2 21* 33* 31  GLUCOSE 748* 352* 233*  BUN 57* 61* 45*  CREATININE 6.58* 6.28* 4.54*  CALCIUM 9.3 9.1 8.7*    LFT   Recent Labs  11/16/16 0853 11/17/16 0626 11/18/16 0753  PROT 9.4* 7.9 6.5  ALBUMIN 4.9 4.3  4.2 3.5  AST 16 10* 15  ALT 8* 10* 9*  ALKPHOS 147* 125 106  BILITOT 1.9* 2.2* 1.8*     Assessment:  #1. Nausea vomiting secondary to diabetic gastroparesis at least possibly made worse with renal failure.he appears to be responding to current therapy consisting of IV pantoprazole by mouth sucralfate and antiemetic.  #2.  Anemia. Anemia appears to be due to chronic disease. No evidence of overt GIB.  #3 .Hypokalemia. He is getting runs of IV potassium.  #4. Renal failure.  His GFR was normal 2 half years ago. Renal function has improved with hydration from GFR of 12-18 in the last 24 hours.Dr. Theador Hawthorne of nephrology is seeing patient.  Recommendations:  Advance  diet to modified carb/renal diet.

## 2016-11-18 NOTE — Progress Notes (Signed)
11/18/2016 5:54 PM  I came to assess patient.  He reported severe abd pain 10/10 after eating supper.  I came and found him lying in bed in no distress.  I examined his abdomen and it was soft and benign.  I offered to give tylenol.  He refused saying that it does nothing.  He says he usually gets dilaudid when he comes to this hospital and that is what he wants.   I explained to him that it would not be given as that is inappropriate to give at this time.  He says that he ate some of his supper and kept it down.   I will give him a GI cocktail as this has helped him recently.  I asked him to stop refusing to take his other medications.    Murvin Natal MD

## 2016-11-19 DIAGNOSIS — G629 Polyneuropathy, unspecified: Secondary | ICD-10-CM

## 2016-11-19 LAB — CBC WITH DIFFERENTIAL/PLATELET
Basophils Absolute: 0.1 10*3/uL (ref 0.0–0.1)
Basophils Relative: 1 %
Eosinophils Absolute: 0.1 10*3/uL (ref 0.0–0.7)
Eosinophils Relative: 1 %
HCT: 30 % — ABNORMAL LOW (ref 39.0–52.0)
Hemoglobin: 10.1 g/dL — ABNORMAL LOW (ref 13.0–17.0)
LYMPHS PCT: 19 %
Lymphs Abs: 1.6 10*3/uL (ref 0.7–4.0)
MCH: 28.8 pg (ref 26.0–34.0)
MCHC: 33.7 g/dL (ref 30.0–36.0)
MCV: 85.5 fL (ref 78.0–100.0)
MONO ABS: 0.7 10*3/uL (ref 0.1–1.0)
Monocytes Relative: 8 %
NEUTROS ABS: 6.2 10*3/uL (ref 1.7–7.7)
NEUTROS PCT: 71 %
Platelets: 303 10*3/uL (ref 150–400)
RBC: 3.51 MIL/uL — ABNORMAL LOW (ref 4.22–5.81)
RDW: 14.5 % (ref 11.5–15.5)
WBC: 8.6 10*3/uL (ref 4.0–10.5)

## 2016-11-19 LAB — GLUCOSE, CAPILLARY
Glucose-Capillary: 311 mg/dL — ABNORMAL HIGH (ref 65–99)
Glucose-Capillary: 362 mg/dL — ABNORMAL HIGH (ref 65–99)

## 2016-11-19 LAB — COMPREHENSIVE METABOLIC PANEL
ALT: 8 U/L — AB (ref 17–63)
AST: 13 U/L — AB (ref 15–41)
Albumin: 3.3 g/dL — ABNORMAL LOW (ref 3.5–5.0)
Alkaline Phosphatase: 90 U/L (ref 38–126)
Anion gap: 10 (ref 5–15)
BUN: 35 mg/dL — ABNORMAL HIGH (ref 6–20)
CO2: 28 mmol/L (ref 22–32)
CREATININE: 4.09 mg/dL — AB (ref 0.61–1.24)
Calcium: 8.7 mg/dL — ABNORMAL LOW (ref 8.9–10.3)
Chloride: 90 mmol/L — ABNORMAL LOW (ref 101–111)
GFR calc non Af Amer: 18 mL/min — ABNORMAL LOW (ref >60)
GFR, EST AFRICAN AMERICAN: 21 mL/min — AB (ref >60)
Glucose, Bld: 338 mg/dL — ABNORMAL HIGH (ref 65–99)
POTASSIUM: 3.1 mmol/L — AB (ref 3.5–5.1)
SODIUM: 128 mmol/L — AB (ref 135–145)
Total Bilirubin: 0.7 mg/dL (ref 0.3–1.2)
Total Protein: 6.1 g/dL — ABNORMAL LOW (ref 6.5–8.1)

## 2016-11-19 LAB — VITAMIN D 25 HYDROXY (VIT D DEFICIENCY, FRACTURES): Vit D, 25-Hydroxy: 15.2 ng/mL — ABNORMAL LOW (ref 30.0–100.0)

## 2016-11-19 LAB — PHOSPHORUS: PHOSPHORUS: 2.1 mg/dL — AB (ref 2.5–4.6)

## 2016-11-19 LAB — MAGNESIUM: MAGNESIUM: 1.9 mg/dL (ref 1.7–2.4)

## 2016-11-19 MED ORDER — SUCRALFATE 1 GM/10ML PO SUSP: 1.0000 g | Freq: Three times a day (TID) | ORAL | 0 refills | Status: DC

## 2016-11-19 MED ORDER — SODIUM CHLORIDE 0.9 % IV SOLN: 1000.0000 mL | INTRAVENOUS | Status: DC

## 2016-11-19 MED ORDER — GABAPENTIN 300 MG PO CAPS: 300.0000 mg | ORAL_CAPSULE | Freq: Every day | ORAL | Status: DC

## 2016-11-19 MED ORDER — POTASSIUM CHLORIDE 20 MEQ/15ML (10%) PO SOLN
40.0000 meq | Freq: Once | ORAL | Status: AC
  Administered 2016-11-19: 40 meq via ORAL
  Filled 2016-11-19: qty 30

## 2016-11-19 MED ORDER — POTASSIUM CHLORIDE CRYS ER 20 MEQ PO TBCR: 20.0000 meq | EXTENDED_RELEASE_TABLET | Freq: Every day | ORAL | 0 refills | Status: DC

## 2016-11-19 MED ORDER — PANTOPRAZOLE SODIUM 40 MG PO TBEC: 40.0000 mg | DELAYED_RELEASE_TABLET | Freq: Two times a day (BID) | ORAL | 0 refills | Status: DC

## 2016-11-19 NOTE — Discharge Summary (Signed)
Physician Discharge Summary  [name]:[id number] DOB: 1985-07-18 DOA: 11/16/2016  PCP: [name], MD  Admit date: 11/16/2016 Discharge date: 11/19/2016  Admitted From: Home  Disposition: Home   PT DISCHARGING [place], REFUSING [place], DEMANDING TO DISCHARGE HOME HE IS HIGH RISK FOR READMISSION AND ADVERSE OUTCOMES DUE TO NONCOMPLIANCE AND REFUSAL TO PARTICIPATE IN HIS CARE. This was told to patient in detail through counseling at bedside and he verbalized understanding.   Recommendations for Outpatient Follow-up:  1. Follow up with PCP in 1 weeks 2. Follow up with nephrologist in 2 weeks 3. Refer to cardiologist for follow up and surveillance of bicuspid aortic valve 4. Please obtain BMP/CBC in 1 week 5. Please follow up final culture results done in hospital.   Discharge Condition: STABLE   CODE STATUS: FULL    Brief Hospitalization Summary: Please see all hospital notes, images, labs for full details of the hospitalization.  HPI: Brent Daniels is a 31 y.o. male with chronic kidney disease, type 1 diabetes mellitus, poorly controlled, presented to the emergency department by [facility] Department under IVC by his mother. He has been having intractable nausea and vomiting for the past 3 days. He had refused to come into the emergency department. The mother reports that he refuses to care for himself at home. He is abusing medications. He was discharged from the pain management clinic for abusing opioids. He has multiple hospitalizations for multiple regional hospital's. He was recently discharged from [facility] in September 2018 with MSSA sepsis and DKA. He completed a course of IV antibiotics on 10/31/2016.  The patient complains of pain all over. He has a long history of drug-seeking behavior. He has a high tolerance for opioids. He reports that he has not had opioids for several days. He was not prescribed opioids  when he was discharge from [facility]. He also has a history of gastroparesis and esophagitis. He was noted in the ED to be clinically dehydrated. He has been vomiting. He is constantly asking for pain medications but his physical exam has been essentially benign. He is not in DKA at this time. He does have a history of this. His IVC was rescinded by the ED physician because the patient was not suicidal and did not have a suicidal plan, not homicidal and did not have any homicidal plans. The patient is being admitted into the hospital for his intractable nausea and vomiting concerns for uremia. His creatinine has increased from 5.08 to now greater than 6. I reviewed the records from [facility] and he was seen by a nephrologist there and they were concerned that he was rapidly approaching the need to have hemodialysis. It is unclear if the patient ever followed up with outpatient nephrology and eaten as they had recommended for him. The patient reports that he does have small amount of urine output. We are currently awaiting to have his urine tested.  He was noted to have a bicuspid aortic valve at [facility]. He reports last bowel movement was yesterday.   Brief Admission Hx: 31 y.o.malewith chronic kidney disease, type 1 diabetes mellitus, poorly controlled, presented to the emergency department by [facility] Department under IVC by his mother. He has been having intractable nausea and vomiting for the past 3 days.  He was admitted with worsening CKD, hyperglycemia, intractable nausea and vomiting.   MDM/Assessment & Plan:   1. Acute on chronic CKD stage V -  Pt was treated with IVF hydration with good results. Nephrology followed. Creatinine trending down and symptoms improving. Creatinine down to 4.09 which is better than it was when discharged from [facility] in September. Pt has been refusing care in hospital, refusing medications, refusing to bathe, refusing  to follow diet, demanded to be discharged so will discharge home against medical advice. I counseled him on the dangers and risks including death and he verbalized understanding.    2. Intractable nausea and vomiting-symptoms much improved, pt tolerated advanced diet, also eating things not on his recommended diet.  Appreciate GI assistance. will recommend he follow up with GI in 2 weeks.  3. Type 1 diabetes mellitus, poorly controlled with multiple complications- M0N 0.2%, resume basal Lantus 20 units daily, NovoLog 5 units 3 times a day before meals plus supplemental coverage for high blood glucose readings. Resumed his home meds at discharge, he says that he does have all of his insulin and supplies and did not need prescription.   4. GERD-IV pantoprazole ordered with good results, resume oral pantoprazole at discharge.  5. Chronic pain with history of drug diversion and drug-seeking behaviors-avoided narcotics which angered patient.  He demanded dilauded but we did not give that because I felt that it would be totally inappropriate to do that. He was not prescribed narcotics for opioids when he was discharged from [facility] in September 2018. He was also discharged from his chronic pain management clinic for drug diversion. He has no physical exam findings to support the degree of abdominal pain that he is describing.  He has a benign abdominal exam. 6. Tobacco abuse-nicotine patch ordered for use in the hospital but he refused.  He was caught by RN smoking in his hospital room.  7. History of peptic ulcer disease-IV pantoprazole was given. Continue protonix.  His EGD from 2015 did not reveal PUD. 8. Essential hypertension-currently uncontrolled-not taking medications at home, patient refused to take blood pressure medications in the hospital.  9. Bicuspid aortic valve-noted on TEE performed at [facility] in September of this year. It was recommended that he follow-up with cardiology  outpatient for ongoing surveillance. 10. Diabetic neuropathy and gastroparesis-secondary to poorly controlled type 1 diabetes mellitus.  11. IVC-rescinded by the emergency department physician as patient is denying suicidal ideation, homicidal ideation and any plans to harm self or others. 12. Anemia of CKD - Hg 12.1, will follow. Pt had been prescribed iron tablet 325 mg daily by nephrology at [facility].  13. Vitamin D Deficiency - resume weekly vitamin D replacement.  14. Leukocytosis - suspect this is reactive from 3 days of intractable vomiting, no infection found, urinalysis ok, check CT abdomen and repeat CXR. Removed central line - It has been in place x 2 weeks, follow blood cultures no growth to date, cath tip culture pending. WBC normalized on day of discharge. culture no growth to date.   15. Hypokalemia - pt refusing to take potassium.  I asked him this morning if he would allow me to give it IV and he says that if I gave him a prescription for potassium he would take it after he is discharged.    DVT Prophylaxis:heparin Code Status:Full  Family Communication:none present  Disposition Plan:TBD  Consultants:  Nephrology  GI  Discharge Diagnoses:  Principal Problem:   Uremia Active Problems:   Uncontrolled type 1 diabetes mellitus with hyperglycemia, with long-term current use of insulin (HCC)   CKD (chronic kidney disease), stage  V ([place])   Essential hypertension   GERD (gastroesophageal reflux disease)   Intractable nausea and vomiting   Chronic generalized abdominal pain   Leukocytosis   PUD (peptic ulcer disease)   Hyperglycemia   Gastroparesis   Drug abuse (HCC)   Tobacco use   HLD (hyperlipidemia)   Hyperglycemia due to type 1 diabetes mellitus (HCC)   Neuropathy   Vitamin D deficiency   Acute renal failure (HCC)   ATN (acute tubular necrosis) (HCC)   Erosive esophagitis  Discharge Instructions: Discharge Instructions    Call MD for:   difficulty breathing, headache or visual disturbances    Complete by:  As directed    Call MD for:  extreme fatigue    Complete by:  As directed    Call MD for:  persistant dizziness or light-headedness    Complete by:  As directed    Call MD for:  persistant nausea and vomiting    Complete by:  As directed    Call MD for:  temperature >100.4    Complete by:  As directed    Increase activity slowly    Complete by:  As directed      Allergies as of 11/19/2016      Reactions   Hydrocodone Hives   Tramadol Anaphylaxis, Other (See Comments)   Acid Reflux   Ibuprofen Other (See Comments)   Stomach ulcers   Naproxen Other (See Comments)   Stomach ulcers   Sulfa Antibiotics Other (See Comments)   Stomach ulcers   Morphine And Related Rash      Medication List    STOP taking these medications   oxyCODONE-acetaminophen 10-325 MG tablet Commonly known as:  PERCOCET     TAKE these medications   amLODipine 10 MG tablet Commonly known as:  NORVASC Take 10 mg by mouth daily.   aspirin 81 MG tablet Take 81 mg by mouth daily.   BASAGLAR KWIKPEN 100 UNIT/ML Sopn Inject 20 Units into the skin daily.   gabapentin 300 MG capsule Commonly known as:  NEURONTIN Take 1 capsule (300 mg total) by mouth at bedtime. What changed:  when to take this   insulin regular 100 units/mL injection Commonly known as:  NOVOLIN R,HUMULIN R Inject 2-10 Units into the skin 3 (three) times daily before meals. Sliding scale.   metoCLOPramide 10 MG tablet Commonly known as:  REGLAN 10 mg 4 (four) times daily -  before meals and at bedtime.   metoprolol succinate 100 MG 24 hr tablet Commonly known as:  TOPROL-XL Take 1 tablet (100 mg total) by mouth daily.   pantoprazole 40 MG tablet Commonly known as:  PROTONIX Take 1 tablet (40 mg total) by mouth 2 (two) times daily before a meal. What changed:  when to take this   potassium chloride SA 20 MEQ tablet Commonly known as:  K-DUR,KLOR-CON Take  1 tablet (20 mEq total) by mouth daily.   sucralfate 1 GM/10ML suspension Commonly known as:  CARAFATE Take 10 mLs (1 g total) by mouth 4 (four) times daily -  with meals and at bedtime.   tiZANidine 4 MG tablet Commonly known as:  ZANAFLEX 4 mg 2 (two) times daily.   Vitamin D (Ergocalciferol) 50000 units Caps capsule Commonly known as:  DRISDOL Take 50,000 Units by mouth every 7 (seven) days.      Follow-up Information    Corrington, Kip A, MD. Schedule an appointment as soon as possible for a visit in 4 day(s).  Specialty:  Family Medicine Why:  Hospital Follow Up  Contact information: [place] [address] [place] [address] [phone]        Glenda Chroman, MD. Schedule an appointment as soon as possible for a visit in 1 week(s).   Specialty:  Internal Medicine Why:  Hospital Follow Up  Contact information: [place] [address] [phone]        Liana Gerold, MD. Schedule an appointment as soon as possible for a visit in 2 week(s).   Specialty:  Nephrology Why:  Hospital Follow Up  Contact information: [address] [place] [address] [phone]          Allergies  Allergen Reactions  . Hydrocodone Hives  . Tramadol Anaphylaxis and Other (See Comments)    Acid Reflux  . Ibuprofen Other (See Comments)    Stomach ulcers  . Naproxen Other (See Comments)    Stomach ulcers  . Sulfa Antibiotics Other (See Comments)    Stomach ulcers  . Morphine And Related Rash   Current Discharge Medication List    START taking these medications   Details  potassium chloride SA (K-DUR,KLOR-CON) 20 MEQ tablet Take 1 tablet (20 mEq total) by mouth daily. Qty: 7 tablet, Refills: 0    sucralfate (CARAFATE) 1 GM/10ML suspension Take 10 mLs (1 g total) by mouth 4 (four) times daily -  with meals and at bedtime. Qty: 420 mL, Refills: 0      CONTINUE these medications which have CHANGED   Details  gabapentin (NEURONTIN) 300 MG  capsule Take 1 capsule (300 mg total) by mouth at bedtime.    pantoprazole (PROTONIX) 40 MG tablet Take 1 tablet (40 mg total) by mouth 2 (two) times daily before a meal. Qty: 60 tablet, Refills: 0      CONTINUE these medications which have NOT CHANGED   Details  amLODipine (NORVASC) 10 MG tablet Take 10 mg by mouth daily.     aspirin 81 MG tablet Take 81 mg by mouth daily.    Insulin Glargine (BASAGLAR KWIKPEN) 100 UNIT/ML SOPN Inject 20 Units into the skin daily. Refills: 12    insulin regular (NOVOLIN R,HUMULIN R) 100 units/mL injection Inject 2-10 Units into the skin 3 (three) times daily before meals. Sliding scale.    metoCLOPramide (REGLAN) 10 MG tablet 10 mg 4 (four) times daily -  before meals and at bedtime.    metoprolol succinate (TOPROL-XL) 100 MG 24 hr tablet Take 1 tablet (100 mg total) by mouth daily. Qty: 30 tablet, Refills: 0    tiZANidine (ZANAFLEX) 4 MG tablet 4 mg 2 (two) times daily.     Vitamin D, Ergocalciferol, (DRISDOL) 50000 units CAPS capsule Take 50,000 Units by mouth every 7 (seven) days.      STOP taking these medications     oxyCODONE-acetaminophen (PERCOCET) 10-325 MG tablet         Procedures/Studies: Ct Abdomen Pelvis Wo Contrast  Result Date: 11/17/2016 CLINICAL DATA:  Nausea, vomiting and diarrhea for the past 3 days. Elevated creatinine. EXAM: CT ABDOMEN AND PELVIS WITHOUT CONTRAST TECHNIQUE: Multidetector CT imaging of the abdomen and pelvis was performed following the standard protocol without IV contrast. COMPARISON:  Renal ultrasound dated 11/16/2016. Abdomen and pelvis CT dated 08/10/2016. FINDINGS: Lower chest: Unremarkable. Hepatobiliary: Cholecystectomy clips.  Normal appearing liver. Pancreas: Unremarkable. No pancreatic ductal dilatation or surrounding inflammatory changes. Spleen: Normal in size without focal abnormality. Adrenals/Urinary Tract: Adrenal glands are unremarkable. Kidneys  are normal, without renal calculi, focal  lesion, or hydronephrosis. Bladder is unremarkable. Stomach/Bowel: Stable small hiatal hernia and diffuse distal esophageal wall thickening. Unremarkable small bowel and colon. No evidence of appendicitis. Vascular/Lymphatic: Mild atheromatous arterial calcifications without aneurysm. Mildly prominent retroperitoneal lymph nodes are again demonstrated. The previously demonstrated 11 mm short axis left para-aortic node has a short axis diameter of 11 mm on image number 36 of series 2. Reproductive: Prostate is unremarkable. Other: No abdominal wall hernia or abnormality. No abdominopelvic ascites. Musculoskeletal: Stable mild lower thoracic spine vertebral compression deformities and mild degenerative changes. IMPRESSION: 1. Stable small hiatal hernia with diffuse distal esophageal wall thickening. The wall thickening may be due to reflux esophagitis. 2. Stable mildly prominent retroperitoneal lymph nodes. 3. No acute abnormality. Electronically Signed   By: Claudie Revering M.D.   On: 11/17/2016 07:46   Dg Chest 2 View  Result Date: 11/17/2016 CLINICAL DATA:  Nausea, vomiting, diarrhea for 3 days. EXAM: CHEST  2 VIEW COMPARISON:  11/16/2016 FINDINGS: Mild cardiomegaly. Low lung volumes. Bibasilar atelectasis. Right jugular PICC line is stable. No pneumothorax. No pleural effusion. Stable thoracic spine compare with 02/13/2016 IMPRESSION: Cardiomegaly.  Bibasilar atelectasis. Electronically Signed   By: Marybelle Killings M.D.   On: 11/17/2016 07:39   US Renal  Result Date: 11/16/2016 CLINICAL DATA:  Nausea 3 days. EXAM: RENAL / URINARY TRACT ULTRASOUND COMPLETE COMPARISON:  07/31/2016 FINDINGS: Right Kidney: Length: 11.9 cm. Slight increased cortical echogenicity. No mass or hydronephrosis visualized. Trace perinephric fluid. Left Kidney: Length: 12.6 cm. Slight increased cortical echogenicity. No mass or hydronephrosis visualized. Bladder: Appears normal for degree of bladder distention. IMPRESSION: Normal size  kidneys without hydronephrosis. Trace right perinephric fluid. Subtle increased cortical echogenicity which may be seen in medical renal disease. Electronically Signed   By: Marin Olp M.D.   On: 11/16/2016 16:02   Dg Chest Portable 1 View  Result Date: 11/16/2016 CLINICAL DATA:  Body aches.  Diaphoresis. EXAM: PORTABLE CHEST 1 VIEW COMPARISON:  11/07/2016. FINDINGS: Central line noted with tip at cavoatrial junction. Cardiomegaly with mild pulmonary venous congestion. No focal pulmonary infiltrate. No pleural effusion or pneumothorax. IMPRESSION: 1. Central line with tip at cavoatrial shin. 2. Cardiomegaly with mild pulmonary venous congestion. Electronically Signed   By: Marcello Moores  Register   On: 11/16/2016 09:59      Subjective: Pt says that he is going home this morning.  He has been eating and drinking.  Not vomiting this morning. The patient is adamant that he is going home.  He is not willing to take his medications.  He only wants dilaudid.  He says that if he doesn't get that he is not staying.    Discharge Exam: Vitals:   11/18/16 2109 11/19/16 0436  BP: (!) 176/88 (!) 172/90  Pulse: 92 77  Resp:  17  Temp: 98.8 F (37.1 C) 98.7 F (37.1 C)  SpO2: 100% 96%   Vitals:   11/18/16 0500 11/18/16 1500 11/18/16 2109 11/19/16 0436  BP: (!) 161/87 (!) 157/85 (!) 176/88 (!) 172/90  Pulse: 90 85 92 77  Resp: 16 18  17   Temp:  98.5 F (36.9 C) 98.8 F (37.1 C) 98.7 F (37.1 C)  TempSrc:  Oral    SpO2: 95% 100% 100% 96%  Weight:      Height:       General exam: chronically ill appearing in mild distress. Foul body odor.  Respiratory system: Clear. No increased work of breathing. Cardiovascular system: S1 &  S2 heard, tachycardic. Gastrointestinal system: Abdomen is nondistended, soft and nontender. Normal bowel sounds heard. Central nervous system: Alert and oriented. No focal neurological deficits. Extremities: no CCE.    The results of significant diagnostics from this  hospitalization (including imaging, microbiology, ancillary and laboratory) are listed below for reference.     Microbiology: Recent Results (from the past 240 hour(s))  Culture, blood (Routine X 2) w Reflex to ID Panel     Status: None (Preliminary result)   Collection Time: 11/16/16 10:41 AM  Result Value Ref Range Status   Specimen Description BLOOD LEFT HAND  Final   Special Requests   Final    BOTTLES DRAWN AEROBIC ONLY Blood Culture results may not be optimal due to an inadequate volume of blood received in culture bottles   Culture NO GROWTH 2 DAYS  Final   Report Status PENDING  Incomplete  Culture, blood (Routine X 2) w Reflex to ID Panel     Status: None (Preliminary result)   Collection Time: 11/16/16 10:41 AM  Result Value Ref Range Status   Specimen Description BLOOD RIGHT HAND  Final   Special Requests   Final    BOTTLES DRAWN AEROBIC AND ANAEROBIC Blood Culture adequate volume   Culture NO GROWTH 2 DAYS  Final   Report Status PENDING  Incomplete     Labs: BNP (last 3 results) No results for input(s): BNP in the last 8760 hours. Basic Metabolic Panel:  Recent Labs Lab 11/16/16 1227  11/16/16 1928 11/17/16 0626 11/18/16 0753 11/18/16 1543 11/19/16 0631  NA  --   --  130* 136 124* 129* 128*  K  --   --  3.8 3.1* 2.8* 2.7* 3.1*  CL  --   --  74* 79* 79* 84* 90*  CO2  --   --  21* 33* 31 32 28  GLUCOSE  --   --  748* 352* 233* 145* 338*  BUN  --   --  57* 61* 45* 40* 35*  CREATININE  --   --  6.58* 6.28* 4.54* 4.24* 4.09*  CALCIUM  --   < > 9.3 9.1 8.7* 9.1 8.7*  MG  --   --   --  2.8* 2.0  --  1.9  PHOS 3.7  --   --  5.5* 3.6  --  2.1*  < > = values in this interval not displayed. Liver Function Tests:  Recent Labs Lab 11/16/16 0853 11/17/16 0626 11/18/16 0753 11/19/16 0631  AST 16 10* 15 13*  ALT 8* 10* 9* 8*  ALKPHOS 147* 125 106 90  BILITOT 1.9* 2.2* 1.8* 0.7  PROT 9.4* 7.9 6.5 6.1*  ALBUMIN 4.9 4.3  4.2 3.5 3.3*   No results for input(s):  LIPASE, AMYLASE in the last 168 hours. No results for input(s): AMMONIA in the last 168 hours. CBC:  Recent Labs Lab 11/16/16 0853 11/17/16 0626 11/18/16 0753 11/19/16 0631  WBC 19.1* 24.9* 18.3* 8.6  NEUTROABS  --   --  15.3* 6.2  HGB 12.1* 11.4* 10.2* 10.1*  HCT 35.9* 34.6* 29.6* 30.0*  MCV 85.7 86.9 84.3 85.5  PLT 526* 470* 307 303   Cardiac Enzymes: No results for input(s): CKTOTAL, CKMB, CKMBINDEX, TROPONINI in the last 168 hours. BNP: Invalid input(s): POCBNP CBG:  Recent Labs Lab 11/18/16 0718 11/18/16 1121 11/18/16 1627 11/18/16 2158 11/19/16 0737  GLUCAP 239* 168* 153* 260* 311*   D-Dimer No results for input(s): DDIMER in the last 72 hours. Hgb  A1c  Recent Labs  11/16/16 1100  HGBA1C 7.6*   Lipid Profile No results for input(s): CHOL, HDL, LDLCALC, TRIG, CHOLHDL, LDLDIRECT in the last 72 hours. Thyroid function studies  Recent Labs  11/16/16 1227  TSH 0.191*   Anemia work up  Recent Labs  11/16/16 1500 11/16/16 1513  VITAMINB12 510  --   FOLATE 30.0  --   FERRITIN 76  --   TIBC 449  --   IRON 35*  --   RETICCTPCT  --  0.6   Urinalysis    Component Value Date/Time   COLORURINE YELLOW 11/16/2016 1221   APPEARANCEUR CLEAR 11/16/2016 1221   LABSPEC 1.016 11/16/2016 1221   PHURINE 5.0 11/16/2016 1221   GLUCOSEU >=500 (A) 11/16/2016 1221   HGBUR MODERATE (A) 11/16/2016 1221   BILIRUBINUR NEGATIVE 11/16/2016 1221   KETONESUR 80 (A) 11/16/2016 1221   PROTEINUR >=300 (A) 11/16/2016 1221   UROBILINOGEN 1.0 11/13/2014 0155   NITRITE NEGATIVE 11/16/2016 1221   LEUKOCYTESUR NEGATIVE 11/16/2016 1221   Sepsis Labs Invalid input(s): PROCALCITONIN,  WBC,  LACTICIDVEN Microbiology Recent Results (from the past 240 hour(s))  Culture, blood (Routine X 2) w Reflex to ID Panel     Status: None (Preliminary result)   Collection Time: 11/16/16 10:41 AM  Result Value Ref Range Status   Specimen Description BLOOD LEFT HAND  Final   Special  Requests   Final    BOTTLES DRAWN AEROBIC ONLY Blood Culture results may not be optimal due to an inadequate volume of blood received in culture bottles   Culture NO GROWTH 2 DAYS  Final   Report Status PENDING  Incomplete  Culture, blood (Routine X 2) w Reflex to ID Panel     Status: None (Preliminary result)   Collection Time: 11/16/16 10:41 AM  Result Value Ref Range Status   Specimen Description BLOOD RIGHT HAND  Final   Special Requests   Final    BOTTLES DRAWN AEROBIC AND ANAEROBIC Blood Culture adequate volume   Culture NO GROWTH 2 DAYS  Final   Report Status PENDING  Incomplete    Time coordinating discharge: 37 mins  SIGNED:  Irwin Brakeman, MD  [facility] 11/19/2016, 10:06 AM Pager [phone]  If 7PM-7AM, please contact night-coverage [email or web address] Password [email or web address]

## 2016-11-19 NOTE — Discharge Instructions (Signed)
Follow with Primary MD  Corrington, Kip A, MD  and other consultant's as instructed your Hospitalist MD  Please get a complete blood count and chemistry panel checked by your Primary MD at your next visit, and again as instructed by your Primary MD.  Get Medicines reviewed and adjusted: Please take all your medications with you for your next visit with your Primary MD  Laboratory/radiological data: Please request your Primary MD to go over all hospital tests and procedure/radiological results at the follow up, please ask your Primary MD to get all Hospital records sent to his/her office.  In some cases, they will be blood work, cultures and biopsy results pending at the time of your discharge. Please request that your primary care M.D. follows up on these results.  Also Note the following: If you experience worsening of your admission symptoms, develop shortness of breath, life threatening emergency, suicidal or homicidal thoughts you must seek medical attention immediately by calling 911 or calling your MD immediately  if symptoms less severe.  You must read complete instructions/literature along with all the possible adverse reactions/side effects for all the Medicines you take and that have been prescribed to you. Take any new Medicines after you have completely understood and accpet all the possible adverse reactions/side effects.   Do not drive when taking Pain medications or sleeping medications (Benzodaizepines)  Do not take more than prescribed Pain, Sleep and Anxiety Medications. It is not advisable to combine anxiety,sleep and pain medications without talking with your primary care practitioner  Special Instructions: If you have smoked or chewed Tobacco  in the last 2 yrs please stop smoking, stop any regular Alcohol  and or any Recreational drug use.  Wear Seat belts while driving.  Please note: You were cared for by a hospitalist during your hospital stay. Once you are  discharged, your primary care physician will handle any further medical issues. Please note that NO REFILLS for any discharge medications will be authorized once you are discharged, as it is imperative that you return to your primary care physician (or establish a relationship with a primary care physician if you do not have one) for your post hospital discharge needs so that they can reassess your need for medications and monitor your lab values.

## 2016-11-19 NOTE — Progress Notes (Signed)
Patient given discharge instructions at bedside, patient IV removed, tolerated well.

## 2016-11-19 NOTE — Progress Notes (Signed)
Pt non compliant in care. Pt continues to refuse medications. He is non compliant with diet which is affecting overall well being and care. Patient refuses to follow diet ordered by MD. He has been eating popsicles, ice cream, peanut butter, meals, etc. His blood sugar at the earlier part of the shift was 260.   Patient asked nurse to request various orders and medications from the doctors last night. He wanted Benadryl, something to sleep, pain medication, a different anti-nausea medication (without trying the initial prescribed medication.   Pt did have a bowel movement last night.   He appears dis-shelved, but refuses to bathe or let staff change his bedding.   Patient has not complained of pain all night. He says if the doctor is not giving him Dilaudid and Phenergan then he wants to go home. Patient denied pain to nurse at the beginning of the shift.   Discussed plan of care with patient including reviewing his labs such as potassium, creatinine, and BUN. Patient states he doesn't care and wants to be discharged tomorrow.  Will continue to monitor patient.

## 2016-11-19 NOTE — Progress Notes (Signed)
Brent Daniels  MRN: [id number]  DOB/AGE: 1985/11/03 31 y.o.  Primary Care Physician:Corrington, Delsa Grana, MD  Admit date: 11/16/2016  Chief Complaint:  Chief Complaint  Patient presents with  . Medical Clearance    S-Pt presented on  11/16/2016 with  Chief Complaint  Patient presents with  . Medical Clearance  .    Pt voices no new concerns.    Meds . amLODipine  10 mg Oral Daily  . aspirin EC  81 mg Oral Daily  . docusate sodium  100 mg Oral BID  . heparin  5,000 Units Subcutaneous Q8H  . insulin aspart  0-9 Units Subcutaneous TID WC  . insulin aspart  5 Units Subcutaneous TID WC  . insulin glargine  20 Units Subcutaneous Daily  . metoprolol succinate  100 mg Oral Daily  . nicotine  7 mg Transdermal Daily  . pantoprazole (PROTONIX) IV  40 mg Intravenous Q12H  . potassium chloride  60 mEq Oral Once  . sucralfate  1 g Oral TID WC & HS  . Vitamin D (Ergocalciferol)  50,000 Units Oral Q7 days      Physical Exam: Vital signs in last 24 hours: Temp:  [98.5 F (36.9 C)-98.8 F (37.1 C)] 98.7 F (37.1 C) (10/14 0436) Pulse Rate:  [77-92] 77 (10/14 0436) Resp:  [17-18] 17 (10/14 0436) BP: (157-176)/(85-90) 172/90 (10/14 0436) SpO2:  [96 %-100 %] 96 % (10/14 0436) Weight change:  Last BM Date: 11/19/16  Intake/Output from previous day: 10/13 0701 - 10/14 0700 In: 1191.7 [P.O.:720; I.V.:171.7; IV Piggyback:300] Out: -  No intake/output data recorded.   Physical Exam: General- pt is lethargic/sleepy after phnergan. Resp- No acute REsp distress, NO Rhonchi CVS- S1S2 regular in rate and rhythm GIT- BS+, soft, NT, ND EXT- NO LE Edema, no Cyanosis   Lab Results: CBC  Recent Labs  11/18/16 0753 11/19/16 0631  WBC 18.3* 8.6  HGB 10.2* 10.1*  HCT 29.6* 30.0*  PLT 307 303    BMET  Recent Labs  11/18/16 1543 11/19/16 0631  NA 129* 128*  K 2.7* 3.1*  CL 84* 90*  CO2 32 28  GLUCOSE 145* 338*  BUN 40* 35*  CREATININE 4.24* 4.09*  CALCIUM 9.1 8.7*    Creat trend 2018  October  6.58=> 6.25=>4.54=> 4.0 September at [place]   4.9--5.7 July 3.2--4.6 2016  1.3           MICRO Recent Results (from the past 240 hour(s))  Culture, blood (Routine X 2) w Reflex to ID Panel     Status: None (Preliminary result)   Collection Time: 11/16/16 10:41 AM  Result Value Ref Range Status   Specimen Description BLOOD LEFT HAND  Final   Special Requests   Final    BOTTLES DRAWN AEROBIC ONLY Blood Culture results may not be optimal due to an inadequate volume of blood received in culture bottles   Culture NO GROWTH 2 DAYS  Final   Report Status PENDING  Incomplete  Culture, blood (Routine X 2) w Reflex to ID Panel     Status: None (Preliminary result)   Collection Time: 11/16/16 10:41 AM  Result Value Ref Range Status   Specimen Description BLOOD RIGHT HAND  Final   Special Requests   Final    BOTTLES DRAWN AEROBIC AND ANAEROBIC Blood Culture adequate volume   Culture NO GROWTH 2 DAYS  Final   Report Status PENDING  Incomplete      Lab Results  Component Value  Date   PTH 125 (H) 11/16/2016   PTH Comment 11/16/2016   CALCIUM 8.7 (L) 11/19/2016   CAION 1.15 03/31/2014   CAION 1.15 03/31/2014   PHOS 2.1 (L) 11/19/2016               Impression: 1)Renal  AKI secondary to Prerenal/ATN/ Hypercalcemia associated afferent vasoconstriction                     AKI on CKD               CKD stage 5.               CKD since 2016               CKD secondary to DM 1                Progression of CKD                  as expected for uncontrolled DM 1-Pt with 22 years of DM                 multiple AKI                Proteinura Present.                             AKI now better  2)HTN  Medication- On Calcium Channel Blockers On Beta blockers   3)Anemia HGb at goal (9--11)  4)CKD Mineral-Bone Disorder PTH high Secondary Hyperparathyroidismpresent Phosphorus not  at goal. Calcium- better   5)GI- admitted with  Nausea and vomiting. Clinically better Primary MD following  6)Electrolytes Hypokalemic Being replte   Hypoonatremic    CKD   Hyperglycemia    7)Acid base Co2 at goal   8) Social- pt refusing medical care.Pt came In to Hospital under similar circumstances.primary team folowing closely.  Plan:  Will continue current care. No acute need of Hd today   Hassel Uphoff S 11/19/2016, 10:09 AM

## 2016-11-20 NOTE — Progress Notes (Signed)
I question if this is a contamination, blood cultures x 2 have been negative x 4 days.

## 2016-11-21 LAB — CULTURE, BLOOD (ROUTINE X 2)
Culture: NO GROWTH
Culture: NO GROWTH
Special Requests: ADEQUATE

## 2016-11-21 LAB — CATH TIP CULTURE

## 2016-12-01 ENCOUNTER — Ambulatory Visit: Payer: Self-pay | Admitting: "Endocrinology

## 2016-12-19 ENCOUNTER — Telehealth (INDEPENDENT_AMBULATORY_CARE_PROVIDER_SITE_OTHER): Payer: Self-pay | Admitting: Internal Medicine

## 2016-12-19 NOTE — Telephone Encounter (Signed)
A male called for the patient, she is wanting to schedule him an appointment here.  She stated that he was a patient of [facility], but was discharged from their practice for missing appointments.  She stated that he has been in the hospital a lot and she explained that to them and they still would not see him.  She stated that he really needs a GI doctor.  I've asked her to have Dr. Woody Seller send [place] a referral for review and that I would have to discuss with our office manager next week.  Once she advises, I will let her know.  [phone]

## 2016-12-26 NOTE — Telephone Encounter (Signed)
Once we get referral this needs to be discussed with Dr. Laural Golden.

## 2017-01-01 ENCOUNTER — Ambulatory Visit (INDEPENDENT_AMBULATORY_CARE_PROVIDER_SITE_OTHER): Payer: Managed Care, Other (non HMO) | Admitting: "Endocrinology

## 2017-01-01 ENCOUNTER — Encounter: Payer: Self-pay | Admitting: "Endocrinology

## 2017-01-01 VITALS — BP 159/96 | HR 80 | Ht 72.0 in | Wt 211.0 lb

## 2017-01-01 DIAGNOSIS — E1122 Type 2 diabetes mellitus with diabetic chronic kidney disease: Secondary | ICD-10-CM

## 2017-01-01 DIAGNOSIS — N185 Chronic kidney disease, stage 5: Secondary | ICD-10-CM

## 2017-01-01 DIAGNOSIS — I10 Essential (primary) hypertension: Secondary | ICD-10-CM | POA: Diagnosis not present

## 2017-01-01 DIAGNOSIS — F17200 Nicotine dependence, unspecified, uncomplicated: Secondary | ICD-10-CM | POA: Insufficient documentation

## 2017-01-01 DIAGNOSIS — E559 Vitamin D deficiency, unspecified: Secondary | ICD-10-CM

## 2017-01-01 MED ORDER — CALCITRIOL 0.25 MCG PO CAPS: 0.2500 ug | ORAL_CAPSULE | Freq: Every day | ORAL | 3 refills | Status: DC

## 2017-01-01 MED ORDER — FREESTYLE LIBRE SENSOR SYSTEM MISC: 2 refills | Status: DC

## 2017-01-01 MED ORDER — FREESTYLE LIBRE READER DEVI: 1.0000 | Freq: Once | 0 refills | Status: AC

## 2017-01-01 NOTE — Patient Instructions (Signed)

## 2017-01-01 NOTE — Progress Notes (Signed)
Consult Note       01/01/2017, 3:25 PM   Subjective:    Patient ID: Brent Daniels, male    DOB: 12-25-1985.  Brent Daniels is being seen in consultation for management of currently uncontrolled symptomatic diabetes requested by  Corrington, Kip A, MD.   Past Medical History:  Diagnosis Date  . Acute esophagitis   . Anxiety   . Arthritis   . Chronic abdominal pain   . Chronic back pain   . Diabetes mellitus    type 1  . Diabetic gastroparesis associated with type 1 diabetes mellitus ([place])   . Erosive esophagitis 12/23/2013   "severe" per EGD   . Gastroparesis   . GERD (gastroesophageal reflux disease)   . Heart murmur    valvular stenosis  . Hypercholesteremia   . Hypertension   . Insomnia   . Nausea and vomiting    chronic, recurrent  . Neuropathy   . PUD (peptic ulcer disease)   . S/P arthroscopic knee surgery 07/04/2011  . Scoliosis 07/11/2012   Past Surgical History:  Procedure Laterality Date  . BIOPSY  06/17/2013   Procedure: GASTRIC ULCER AND ANTRAL BIOPSIES; ESOPHAGEAL BRUSHING;  Surgeon: Danie Binder, MD;  Location: [facility];  Service: Endoscopy;;  . BIOPSY N/A 12/23/2013   Procedure: ESOPHAGEAL BIOPSY;  Surgeon: Daneil Dolin, MD;  Location: [facility];  Service: Endoscopy;  Laterality: N/A;  . CHOLECYSTECTOMY N/A 01/28/2014   Procedure: LAPAROSCOPIC CHOLECYSTECTOMY;  Surgeon: Jamesetta So, MD;  Location: [facility];  Service: General;  Laterality: N/A;  . ESOPHAGOGASTRODUODENOSCOPY (EGD) WITH PROPOFOL  06/17/2013   Dr. Oneida Alar: two gastric ulcers in fundus, mild antral gastritis, candida esophagitis  . ESOPHAGOGASTRODUODENOSCOPY (EGD) WITH PROPOFOL N/A 09/11/2013   Dr. [name]: abnormal hypopharynx, question massively enlarged tonsils, stomach full of food precluded exam, needs EGD for verification of ulcer healing at a later date  . ESOPHAGOGASTRODUODENOSCOPY (EGD) WITH PROPOFOL N/A  12/23/2013   RMR: Severe exudative esophagitis likely predominatly reflux related. Superimposed Candida infection not excluded status post KOH brushing and biopsy. Localized excoriating gastric mucosa most consistant with trauma(vomiting). No evidence of peptic ulcer disease or other gastric/duodenal pathology. I suspect severe inflammation involving the distal esophagus may account  for at least some of patii  . KNEE ARTHROSCOPY  06/30/2011   Procedure: ARTHROSCOPY KNEE;  Surgeon: Carole Civil, MD;  Location: [facility];  Service: Orthopedics;  Laterality: Right;  diagnostic arthroscopy  . TYMPANOSTOMY TUBE PLACEMENT     Social History   Socioeconomic History  . Marital status: Single    Spouse name: None  . Number of children: None  . Years of education: 31  . Highest education level: None  Social Needs  . Financial resource strain: None  . Food insecurity - worry: None  . Food insecurity - inability: None  . Transportation needs - medical: None  . Transportation needs - non-medical: None  Occupational History  . Occupation: unemployed    Fish farm manager: UNEMPLOYED    Comment: trying to get disability  Tobacco Use  . Smoking status: Current Some Day Smoker    Packs/day: 0.25  Years: 10.00    Pack years: 2.50    Types: Cigarettes  . Smokeless tobacco: Former Systems developer    Quit date: 02/17/2013  . Tobacco comment: smokes 4 cigarettes a day   Substance and Sexual Activity  . Alcohol use: No  . Drug use: No  . Sexual activity: Not Currently  Other Topics Concern  . None  Social History Narrative  . None   Outpatient Encounter Medications as of 01/01/2017  Medication Sig  . amLODipine (NORVASC) 10 MG tablet Take 10 mg by mouth daily.   . cloNIDine (CATAPRES) 0.2 MG tablet Take 0.2 mg by mouth daily.  Marland Kitchen gabapentin (NEURONTIN) 300 MG capsule Take 1 capsule (300 mg total) by mouth at bedtime.  . Insulin Glargine (LANTUS) 100 UNIT/ML Solostar Pen Inject 20 Units into the skin daily at  10 pm.  . insulin regular (NOVOLIN R,HUMULIN R) 100 units/mL injection Inject 5-11 Units into the skin 3 (three) times daily before meals.  . metoCLOPramide (REGLAN) 10 MG tablet 10 mg 4 (four) times daily -  before meals and at bedtime.  . metoprolol tartrate (LOPRESSOR) 25 MG tablet Take 25 mg by mouth 2 (two) times daily.  . calcitRIOL (ROCALTROL) 0.25 MCG capsule Take 1 capsule (0.25 mcg total) by mouth daily.  . Continuous Blood Gluc Receiver (FREESTYLE LIBRE READER) DEVI 1 Piece by Does not apply route once for 1 dose.  . Continuous Blood Gluc Sensor (FREESTYLE LIBRE SENSOR SYSTEM) MISC Use one sensor every 10 days.  . pantoprazole (PROTONIX) 40 MG tablet Take 1 tablet (40 mg total) by mouth 2 (two) times daily before a meal.  . potassium chloride SA (K-DUR,KLOR-CON) 20 MEQ tablet Take 1 tablet (20 mEq total) by mouth daily.  . [DISCONTINUED] aspirin 81 MG tablet Take 81 mg by mouth daily.  . [DISCONTINUED] Insulin Glargine (BASAGLAR KWIKPEN) 100 UNIT/ML SOPN Inject 20 Units into the skin daily.  . [DISCONTINUED] metoprolol succinate (TOPROL-XL) 100 MG 24 hr tablet Take 1 tablet (100 mg total) by mouth daily. (Patient not taking: Reported on 01/01/2017)  . [DISCONTINUED] sucralfate (CARAFATE) 1 GM/10ML suspension Take 10 mLs (1 g total) by mouth 4 (four) times daily -  with meals and at bedtime.  . [DISCONTINUED] tiZANidine (ZANAFLEX) 4 MG tablet 4 mg 2 (two) times daily.   . [DISCONTINUED] Vitamin D, Ergocalciferol, (DRISDOL) 50000 units CAPS capsule Take 50,000 Units by mouth every 7 (seven) days.   No facility-administered encounter medications on file as of 01/01/2017.     ALLERGIES: Allergies  Allergen Reactions  . Hydrocodone Hives  . Tramadol Anaphylaxis and Other (See Comments)    Acid Reflux  . Ibuprofen Other (See Comments)    Stomach ulcers  . Naproxen Other (See Comments)    Stomach ulcers  . Sulfa Antibiotics Other (See Comments)    Stomach ulcers  . Morphine And  Related Rash    VACCINATION STATUS:  There is no immunization history on file for this patient.  Diabetes  He presents for his initial diabetic visit. He has type 1 diabetes mellitus. Onset time: He was diagnosed with type 1 diabetes at age of 36 years. His disease course has been improving. There are no hypoglycemic associated symptoms. Pertinent negatives for hypoglycemia include no confusion, headaches, pallor or seizures. Associated symptoms include polydipsia and polyuria. Pertinent negatives for diabetes include no chest pain, no fatigue, no polyphagia and no weakness. There are no hypoglycemic complications. Symptoms are worsening. Diabetic complications include nephropathy and peripheral neuropathy. (He  has stage 5 CKD with GFR of 18%, gastroparesis, peripheral neuropathy. He reports multiple instances of hospitalization for diabetes ketoacidosis in the past, most recently in October 2018. ) Risk factors for coronary artery disease include diabetes mellitus, family history, hypertension, male sex, tobacco exposure and sedentary lifestyle. Current diabetic treatment includes insulin injections. His weight is increasing steadily. He is following a generally unhealthy diet. When asked about meal planning, he reported none. He has not had a previous visit with a dietitian. He never participates in exercise. (He did not bring any meter nor logs with him today. His most recent A1c was 7.6% progressively improving from 10%.) An ACE inhibitor/angiotensin II receptor blocker is not being taken. He does not see a podiatrist.Eye exam is not current.  Hyperlipidemia  This is a chronic problem. The current episode started more than 1 year ago. Exacerbating diseases include diabetes. Pertinent negatives include no chest pain, myalgias or shortness of breath. He is currently on no antihyperlipidemic treatment. Risk factors for coronary artery disease include diabetes mellitus, dyslipidemia, hypertension, male  sex and a sedentary lifestyle.  Hypertension  This is a chronic problem. The current episode started more than 1 year ago. The problem is uncontrolled. Pertinent negatives include no chest pain, headaches, neck pain, palpitations or shortness of breath. Risk factors for coronary artery disease include dyslipidemia, diabetes mellitus, male gender, sedentary lifestyle and smoking/tobacco exposure.      Review of Systems  Constitutional: Negative for chills, fatigue, fever and unexpected weight change.  HENT: Negative for dental problem, mouth sores and trouble swallowing.   Eyes: Negative for visual disturbance.  Respiratory: Negative for cough, choking, chest tightness, shortness of breath and wheezing.   Cardiovascular: Negative for chest pain, palpitations and leg swelling.  Gastrointestinal: Negative for abdominal distention, abdominal pain, constipation, diarrhea, nausea and vomiting.  Endocrine: Positive for polydipsia and polyuria. Negative for polyphagia.  Genitourinary: Negative for dysuria, flank pain, hematuria and urgency.  Musculoskeletal: Negative for back pain, gait problem, myalgias and neck pain.  Skin: Negative for pallor, rash and wound.  Neurological: Negative for seizures, syncope, weakness, numbness and headaches.  Psychiatric/Behavioral: Negative for confusion and dysphoric mood.    Objective:    BP (!) 159/96   Pulse 80   Ht 6' (1.829 m)   Wt 211 lb (95.7 kg)   BMI 28.62 kg/m   Wt Readings from Last 3 Encounters:  01/01/17 211 lb (95.7 kg)  11/16/16 185 lb (83.9 kg)  08/27/16 185 lb 6.5 oz (84.1 kg)     Physical Exam  Constitutional: He is oriented to person, place, and time. He appears well-developed. He is cooperative. No distress.  HENT:  Head: Normocephalic and atraumatic.  Eyes: EOM are normal.  Neck: Normal range of motion. Neck supple. No tracheal deviation present. No thyromegaly present.  Cardiovascular: Normal rate, S1 normal, S2 normal and  normal heart sounds. Exam reveals no gallop.  No murmur heard. Pulses:      Dorsalis pedis pulses are 1+ on the right side, and 1+ on the left side.       Posterior tibial pulses are 1+ on the right side, and 1+ on the left side.  Pulmonary/Chest: Breath sounds normal. No respiratory distress. He has no wheezes.  Abdominal: Soft. Bowel sounds are normal. He exhibits no distension. There is no tenderness. There is no guarding and no CVA tenderness.  Musculoskeletal: He exhibits no edema.       Right shoulder: He exhibits no swelling and no  deformity.  Neurological: He is alert and oriented to person, place, and time. He has normal strength and normal reflexes. No cranial nerve deficit or sensory deficit. Gait normal.  Skin: Skin is warm and dry. No rash noted. No cyanosis. Nails show no clubbing.  Psychiatric: His speech is normal. Cognition and memory are normal.  Reluctant, unconcerned affect seeking more pain medications.   CMP ( most recent) CMP     Component Value Date/Time   NA 128 (L) 11/19/2016 0631   K 3.1 (L) 11/19/2016 0631   CL 90 (L) 11/19/2016 0631   CO2 28 11/19/2016 0631   GLUCOSE 338 (H) 11/19/2016 0631   BUN 35 (H) 11/19/2016 0631   CREATININE 4.09 (H) 11/19/2016 0631   CALCIUM 8.7 (L) 11/19/2016 0631   CALCIUM 9.5 11/16/2016 1513   PROT 6.1 (L) 11/19/2016 0631   ALBUMIN 3.3 (L) 11/19/2016 0631   AST 13 (L) 11/19/2016 0631   ALT 8 (L) 11/19/2016 0631   ALKPHOS 90 11/19/2016 0631   BILITOT 0.7 11/19/2016 0631   GFRNONAA 18 (L) 11/19/2016 0631   GFRAA 21 (L) 11/19/2016 0631    Diabetic Labs (most recent): Lab Results  Component Value Date   HGBA1C 7.6 (H) 11/16/2016   HGBA1C 9.7 (H) 08/24/2016   HGBA1C 10.0 (H) 11/13/2014     Lipid Panel ( most recent) Lipid Panel     Component Value Date/Time   CHOL  07/06/2009 0347    187        ATP III CLASSIFICATION:  <200     mg/dL   Desirable  200-239  mg/dL   Borderline High  >=240    mg/dL   High           TRIG 220 (H) 07/06/2009 0347   HDL 33 (L) 07/06/2009 0347   CHOLHDL 5.7 07/06/2009 0347   VLDL 44 (H) 07/06/2009 0347   LDLCALC (H) 07/06/2009 0347    110        Total Cholesterol/HDL:CHD Risk Coronary Heart Disease Risk Table                     Men   Women  1/2 Average Risk   3.4   3.3  Average Risk       5.0   4.4  2 X Average Risk   9.6   7.1  3 X Average Risk  23.4   11.0        Use the calculated Patient Ratio above and the CHD Risk Table to determine the patient's CHD Risk.        ATP III CLASSIFICATION (LDL):  <100     mg/dL   Optimal  100-129  mg/dL   Near or Above                    Optimal  130-159  mg/dL   Borderline  160-189  mg/dL   High  >190     mg/dL   Very High      Lab Results  Component Value Date   TSH 0.191 (L) 11/16/2016   TSH 0.904 09/30/2013   FREET4 1.60 (H) 11/16/2016     Assessment & Plan:   1. Diabetes mellitus with stage 5 chronic kidney disease ([place])  - Brent Daniels has currently uncontrolled symptomatic type 1 DM since 31  years of age,  with most recent A1c of 7.6 %. Recent labs reviewed.  -his diabetes is complicated by gastroparesis, end-stage  renal disease, neuropathy, chronic heavy smoking and Brent Daniels remains at a high risk for more acute and chronic complications which include CAD, CVA, CKD, retinopathy, and neuropathy. These are all discussed in detail with the patient.  - I have counseled him on diet management and weight loss, by adopting a carbohydrate restricted/protein rich diet.  - Suggestion is made for him to avoid simple carbohydrates  from his diet including Cakes, Sweet Desserts, Ice Cream, Soda (diet and regular), Sweet Tea, Candies, Chips, Cookies, Store Bought Juices, Alcohol in Excess of  1-2 drinks a day, Artificial Sweeteners, and "Sugar-free" Products. This will help patient to have stable blood glucose profile and potentially avoid unintended weight gain.  - I encouraged him to switch to  unprocessed  or minimally processed complex starch and increased protein intake (animal or plant source), fruits, and vegetables.  - he is advised to stick to a routine mealtimes to eat 3 meals  a day and avoid unnecessary snacks ( to snack only to correct hypoglycemia).   - he will be scheduled with Jearld Fenton, RDN, CDE for individualized diabetes education.  - I have approached him with the following individualized plan to manage diabetes and patient agrees:   - Even though he would probably do better with insulin analogs including NovoLog/Humalog Patient wishes to stay on regular insulin for cost reasons. I  will proceed to adjust his basal insulin Lantus 20 units daily at bedtime , and prandial insulin regular insulin 5 units 3 times a day with meals  for pre-meal BG readings of 90-150mg /dl, plus patient specific correction dose for unexpected hyperglycemia above 150mg /dl, associated with strict monitoring of glucose 4 times a day-before meals and at bedtime. - Patient is warned not to take insulin without proper monitoring per orders. -Adjustment parameters are given for hypo and hyperglycemia in writing. -Patient is encouraged to call clinic for blood glucose levels less than 70 or above 300 mg /dl. - Insulin is only choice he has to treat his diabetes. - He'll benefit from continued glucose monitoring, I discussed and initiated a prescription for the FreeStyle CGM device.  - Patient specific target  A1c;  LDL, HDL, Triglycerides, and  Waist Circumference were discussed in detail.  2) BP/HTN: Uncontrolled. Continue current medications including metoprolol, clonidine, amlodipine. He has documented sulfa allergy. 3) Lipids/HPL:   No recent lipid panel. Last fasting lipid panel on subsequent visits. 4)  Weight/Diet: CDE Consult will be initiated , exercise, and detailed carbohydrates information provided.  5) Chronic Care/Health Maintenance:  -he  is not  on ACEI/ARB and Statin medications and  is  encouraged to continue to follow up with Ophthalmology, Dentist,  Podiatrist at least yearly or according to recommendations, and advised to  quit smoking. I have recommended yearly flu vaccine and pneumonia vaccination at least every 5 years; moderate intensity exercise for up to 150 minutes weekly; and  sleep for at least 7 hours a day.  - I advised patient to maintain close follow up with Corrington, Kip A, MD for primary care needs.  - Time spent with the patient: 1 hour, of which >50% was spent in obtaining information about his symptoms, reviewing his previous labs, evaluations, and treatments, counseling him about his  currently uncontrolled type 1 diabetes, hypertension, and developing a plan for long term treatment; his  questions were answered to his satisfaction.  Follow up plan: - Return in about 9 weeks (around 03/05/2017) for follow up with pre-visit labs, meter, and logs.  Glade Lloyd, MD [facility] [address] [place], [place] [address] Phone: [phone]  Fax: [phone]    01/01/2017, 3:25 PM  This note was partially dictated with voice recognition software. Similar sounding words can be transcribed inadequately or may not  be corrected upon review.

## 2017-01-03 NOTE — Telephone Encounter (Signed)
If we receive a referral for this patient, we will discuss with Dr. Laural Golden

## 2017-01-10 ENCOUNTER — Other Ambulatory Visit: Payer: Self-pay

## 2017-01-10 MED ORDER — FREESTYLE LIBRE 14 DAY READER DEVI: 1.0000 | 0 refills | Status: DC

## 2017-01-10 MED ORDER — FREESTYLE LIBRE 14 DAY SENSOR MISC: 1.0000 | 5 refills | Status: DC

## 2017-02-26 ENCOUNTER — Other Ambulatory Visit: Payer: Self-pay

## 2017-02-26 DIAGNOSIS — N185 Chronic kidney disease, stage 5: Secondary | ICD-10-CM

## 2017-03-02 ENCOUNTER — Other Ambulatory Visit: Payer: Self-pay

## 2017-03-02 MED ORDER — FREESTYLE LIBRE 14 DAY SENSOR MISC: 1.0000 | 5 refills | Status: DC

## 2017-03-02 MED ORDER — FREESTYLE LIBRE 14 DAY READER DEVI: 1.0000 | 0 refills | Status: DC

## 2017-03-05 ENCOUNTER — Ambulatory Visit: Payer: Managed Care, Other (non HMO) | Admitting: "Endocrinology

## 2017-03-20 ENCOUNTER — Encounter: Payer: Self-pay | Admitting: *Deleted

## 2017-03-20 ENCOUNTER — Other Ambulatory Visit: Payer: Self-pay

## 2017-03-20 ENCOUNTER — Ambulatory Visit (INDEPENDENT_AMBULATORY_CARE_PROVIDER_SITE_OTHER)
Admission: RE | Admit: 2017-03-20 | Discharge: 2017-03-20 | Disposition: A | Discharge status: Home / Self Care | Payer: Managed Care, Other (non HMO) | Source: Ambulatory Visit | Attending: Vascular Surgery | Admitting: Vascular Surgery

## 2017-03-20 ENCOUNTER — Ambulatory Visit (INDEPENDENT_AMBULATORY_CARE_PROVIDER_SITE_OTHER): Payer: Managed Care, Other (non HMO) | Admitting: Vascular Surgery

## 2017-03-20 ENCOUNTER — Encounter: Payer: Self-pay | Admitting: Vascular Surgery

## 2017-03-20 ENCOUNTER — Other Ambulatory Visit: Payer: Self-pay | Admitting: *Deleted

## 2017-03-20 ENCOUNTER — Ambulatory Visit (HOSPITAL_COMMUNITY)
Admission: RE | Admit: 2017-03-20 | Discharge: 2017-03-20 | Disposition: A | Discharge status: Home / Self Care | Payer: Managed Care, Other (non HMO) | Source: Ambulatory Visit | Attending: Vascular Surgery | Admitting: Vascular Surgery

## 2017-03-20 VITALS — BP 180/104 | HR 79 | Temp 97.2°F | Resp 18 | Ht 74.0 in | Wt 203.0 lb

## 2017-03-20 DIAGNOSIS — N184 Chronic kidney disease, stage 4 (severe): Secondary | ICD-10-CM | POA: Diagnosis not present

## 2017-03-20 DIAGNOSIS — N185 Chronic kidney disease, stage 5: Secondary | ICD-10-CM

## 2017-03-20 NOTE — Progress Notes (Signed)
History of Present Illness:  Patient is a 32 y.o. year old male who presents for placement of a permanent hemodialysis access. The patient is right handed .  The patient is not currently on hemodialysis.    Other chronic medical problems include HTN, DM and CKD stage V.  BUN and creatinine is 50 and 6.58 per Dr. Florentina Addison note on 03/13/2017.   Past Medical History:  Diagnosis Date  . Acute esophagitis   . Anxiety   . Arthritis   . Chronic abdominal pain   . Chronic back pain   . Diabetes mellitus    type 1  . Diabetic gastroparesis associated with type 1 diabetes mellitus ([place])   . Erosive esophagitis 12/23/2013   "severe" per EGD   . Gastroparesis   . GERD (gastroesophageal reflux disease)   . Heart murmur    valvular stenosis  . Hypercholesteremia   . Hypertension   . Insomnia   . Nausea and vomiting    chronic, recurrent  . Neuropathy   . PUD (peptic ulcer disease)   . S/P arthroscopic knee surgery 07/04/2011  . Scoliosis 07/11/2012    Past Surgical History:  Procedure Laterality Date  . BIOPSY  06/17/2013   Procedure: GASTRIC ULCER AND ANTRAL BIOPSIES; ESOPHAGEAL BRUSHING;  Surgeon: Danie Binder, MD;  Location: [facility];  Service: Endoscopy;;  . BIOPSY N/A 12/23/2013   Procedure: ESOPHAGEAL BIOPSY;  Surgeon: Daneil Dolin, MD;  Location: [facility];  Service: Endoscopy;  Laterality: N/A;  . CHOLECYSTECTOMY N/A 01/28/2014   Procedure: LAPAROSCOPIC CHOLECYSTECTOMY;  Surgeon: Jamesetta So, MD;  Location: [facility];  Service: General;  Laterality: N/A;  . ESOPHAGOGASTRODUODENOSCOPY (EGD) WITH PROPOFOL  06/17/2013   Dr. Oneida Alar: two gastric ulcers in fundus, mild antral gastritis, candida esophagitis  . ESOPHAGOGASTRODUODENOSCOPY (EGD) WITH PROPOFOL N/A 09/11/2013   Dr. [name]: abnormal hypopharynx, question massively enlarged tonsils, stomach full of food precluded exam, needs EGD for verification of ulcer healing at a later date  . ESOPHAGOGASTRODUODENOSCOPY (EGD) WITH PROPOFOL  N/A 12/23/2013   RMR: Severe exudative esophagitis likely predominatly reflux related. Superimposed Candida infection not excluded status post KOH brushing and biopsy. Localized excoriating gastric mucosa most consistant with trauma(vomiting). No evidence of peptic ulcer disease or other gastric/duodenal pathology. I suspect severe inflammation involving the distal esophagus may account  for at least some of patii  . KNEE ARTHROSCOPY  06/30/2011   Procedure: ARTHROSCOPY KNEE;  Surgeon: Carole Civil, MD;  Location: [facility];  Service: Orthopedics;  Laterality: Right;  diagnostic arthroscopy  . TYMPANOSTOMY TUBE PLACEMENT       Social History Social History   Tobacco Use  . Smoking status: Current Some Day Smoker    Packs/day: 0.25    Years: 10.00    Pack years: 2.50    Types: Cigarettes  . Smokeless tobacco: Former Systems developer    Quit date: 02/17/2013  . Tobacco comment: smokes 4 cigarettes a day   Substance Use Topics  . Alcohol use: No  . Drug use: No    Family History Family History  Problem Relation Age of Onset  . Cancer Father   . Alcohol abuse Father   . Pseudochol deficiency Neg Hx   . Malignant hyperthermia Neg Hx   . Hypotension Neg Hx   . Anesthesia problems Neg Hx   . Colon cancer Neg Hx     Allergies  Allergies  Allergen Reactions  . Hydrocodone Hives  . Tramadol Anaphylaxis and Other (See  Comments)    Acid Reflux  . Ibuprofen Other (See Comments)    Stomach ulcers  . Naproxen Other (See Comments)    Stomach ulcers  . Sulfa Antibiotics Other (See Comments)    Stomach ulcers  . Morphine And Related Rash     Current Outpatient Medications  Medication Sig Dispense Refill  . amLODipine (NORVASC) 10 MG tablet Take 10 mg by mouth daily.     . calcitRIOL (ROCALTROL) 0.25 MCG capsule Take 1 capsule (0.25 mcg total) by mouth daily. 60 capsule 3  . cloNIDine (CATAPRES) 0.2 MG tablet Take 0.2 mg by mouth daily.    . Continuous Blood Gluc Receiver (FREESTYLE  LIBRE 14 DAY READER) DEVI 1 each by Does not apply route every 14 (fourteen) days. 1 Device 0  . Continuous Blood Gluc Sensor (FREESTYLE LIBRE 14 DAY SENSOR) MISC 1 each by Does not apply route every 14 (fourteen) days. 2 each 5  . Continuous Blood Gluc Sensor (FREESTYLE LIBRE SENSOR SYSTEM) MISC Use one sensor every 10 days. 3 each 2  . gabapentin (NEURONTIN) 300 MG capsule Take 1 capsule (300 mg total) by mouth at bedtime.    . Insulin Glargine (LANTUS) 100 UNIT/ML Solostar Pen Inject 20 Units into the skin daily at 10 pm.    . insulin regular (NOVOLIN R,HUMULIN R) 100 units/mL injection Inject 5-11 Units into the skin 3 (three) times daily before meals.    . metoCLOPramide (REGLAN) 10 MG tablet 10 mg 4 (four) times daily -  before meals and at bedtime.    . metoprolol tartrate (LOPRESSOR) 25 MG tablet Take 25 mg by mouth 2 (two) times daily.    . pantoprazole (PROTONIX) 40 MG tablet Take 1 tablet (40 mg total) by mouth 2 (two) times daily before a meal. 60 tablet 0  . potassium chloride SA (K-DUR,KLOR-CON) 20 MEQ tablet Take 1 tablet (20 mEq total) by mouth daily. 7 tablet 0   No current facility-administered medications for this visit.     ROS:   General:  No weight loss, Fever, chills  HEENT: No recent headaches, no nasal bleeding, no visual changes, no sore throat  Neurologic: No dizziness, blackouts, seizures. No recent symptoms of stroke or mini- stroke. No recent episodes of slurred speech, or temporary blindness.  Cardiac: No recent episodes of chest pain/pressure, no shortness of breath at rest.  No shortness of breath with exertion.  Denies history of atrial fibrillation or irregular heartbeat  Vascular: No history of rest pain in feet.  No history of claudication.  No history of non-healing ulcer, No history of DVT   Pulmonary: No home oxygen, no productive cough, no hemoptysis,  No asthma or wheezing  Musculoskeletal:  [ ]  Arthritis, [ ]  Low back pain,  [ ]  Joint  pain  Hematologic:No history of hypercoagulable state.  No history of easy bleeding.  No history of anemia  Gastrointestinal: No hematochezia or melena,  No gastroesophageal reflux, no trouble swallowing  Urinary: [ ]  chronic Kidney disease, [ ]  on HD - [ ]  MWF or [ ]  TTHS, [ ]  Burning with urination, [ ]  Frequent urination, [ ]  Difficulty urinating;   Skin: No rashes  Psychological: No history of anxiety,  No history of depression   Physical Examination  There were no vitals filed for this visit.  There is no height or weight on file to calculate BMI.  General:  Alert and oriented, no acute distress HEENT: Normal Neck: No bruit or JVD Pulmonary: Clear  to auscultation bilaterally Cardiac: Regular Rate and Rhythm without murmur Gastrointestinal: Soft, non-tender, non-distended, no mass, no scars Skin: No rash Extremity Pulses:  2+ radial, brachial pulses bilaterally Musculoskeletal: No deformity or edema  Neurologic: Upper and lower extremity motor 5/5 and symmetric  DATA:  UE vein mapping Right cephalic 4.19-3.79, Basilic slightly smaller  Left cephalic not adequate, left basilic 0.24-0.97 Radial arteries triphasic flow   ASSESSMENT:  CKD stage V not on HD   PLAN: He is right handed, but the left cephalic vein is not acceptable in size.   He has an acceptable left cephalic in the upper arm.  We will plan right brachial Cephalic UE av fistula verses graft.  Benefits, risks and surgical options were discussed with the patient with Dr. Donnetta Hutching today.    Roxy Horseman PA-C [facility]  The patient was seen in conjunction with Dr. Donnetta Hutching today  I have examined the patient, reviewed and agree with above.  Discussed options with the patient and his family present.  Does have an acceptable antecubital vein on the right by SonoSite ultrasound.  Extremely small forearm cephalic veins bilaterally.  He is right-handed.  Have recommended a  right upper arm AV fistula creation.  Explained the possibility of non-maturation and that he may addition need a prosthetic graft.  We will schedule this as an outpatient at [facility] [place], MD 03/20/2017 2:03 PM

## 2017-03-20 NOTE — H&P (View-Only) (Signed)
History of Present Illness:  Patient is a 32 y.o. year old male who presents for placement of a permanent hemodialysis access. The patient is right handed .  The patient is not currently on hemodialysis.    Other chronic medical problems include HTN, DM and CKD stage V.  BUN and creatinine is 50 and 6.58 per Dr. Florentina Addison note on 03/13/2017.   Past Medical History:  Diagnosis Date  . Acute esophagitis   . Anxiety   . Arthritis   . Chronic abdominal pain   . Chronic back pain   . Diabetes mellitus    type 1  . Diabetic gastroparesis associated with type 1 diabetes mellitus ([place])   . Erosive esophagitis 12/23/2013   "severe" per EGD   . Gastroparesis   . GERD (gastroesophageal reflux disease)   . Heart murmur    valvular stenosis  . Hypercholesteremia   . Hypertension   . Insomnia   . Nausea and vomiting    chronic, recurrent  . Neuropathy   . PUD (peptic ulcer disease)   . S/P arthroscopic knee surgery 07/04/2011  . Scoliosis 07/11/2012    Past Surgical History:  Procedure Laterality Date  . BIOPSY  06/17/2013   Procedure: GASTRIC ULCER AND ANTRAL BIOPSIES; ESOPHAGEAL BRUSHING;  Surgeon: Danie Binder, MD;  Location: [facility];  Service: Endoscopy;;  . BIOPSY N/A 12/23/2013   Procedure: ESOPHAGEAL BIOPSY;  Surgeon: Daneil Dolin, MD;  Location: [facility];  Service: Endoscopy;  Laterality: N/A;  . CHOLECYSTECTOMY N/A 01/28/2014   Procedure: LAPAROSCOPIC CHOLECYSTECTOMY;  Surgeon: Jamesetta So, MD;  Location: [facility];  Service: General;  Laterality: N/A;  . ESOPHAGOGASTRODUODENOSCOPY (EGD) WITH PROPOFOL  06/17/2013   Dr. Oneida Alar: two gastric ulcers in fundus, mild antral gastritis, candida esophagitis  . ESOPHAGOGASTRODUODENOSCOPY (EGD) WITH PROPOFOL N/A 09/11/2013   Dr. [name]: abnormal hypopharynx, question massively enlarged tonsils, stomach full of food precluded exam, needs EGD for verification of ulcer healing at a later date  . ESOPHAGOGASTRODUODENOSCOPY (EGD) WITH PROPOFOL  N/A 12/23/2013   RMR: Severe exudative esophagitis likely predominatly reflux related. Superimposed Candida infection not excluded status post KOH brushing and biopsy. Localized excoriating gastric mucosa most consistant with trauma(vomiting). No evidence of peptic ulcer disease or other gastric/duodenal pathology. I suspect severe inflammation involving the distal esophagus may account  for at least some of patii  . KNEE ARTHROSCOPY  06/30/2011   Procedure: ARTHROSCOPY KNEE;  Surgeon: Carole Civil, MD;  Location: [facility];  Service: Orthopedics;  Laterality: Right;  diagnostic arthroscopy  . TYMPANOSTOMY TUBE PLACEMENT       Social History Social History   Tobacco Use  . Smoking status: Current Some Day Smoker    Packs/day: 0.25    Years: 10.00    Pack years: 2.50    Types: Cigarettes  . Smokeless tobacco: Former Systems developer    Quit date: 02/17/2013  . Tobacco comment: smokes 4 cigarettes a day   Substance Use Topics  . Alcohol use: No  . Drug use: No    Family History Family History  Problem Relation Age of Onset  . Cancer Father   . Alcohol abuse Father   . Pseudochol deficiency Neg Hx   . Malignant hyperthermia Neg Hx   . Hypotension Neg Hx   . Anesthesia problems Neg Hx   . Colon cancer Neg Hx     Allergies  Allergies  Allergen Reactions  . Hydrocodone Hives  . Tramadol Anaphylaxis and Other (See  Comments)    Acid Reflux  . Ibuprofen Other (See Comments)    Stomach ulcers  . Naproxen Other (See Comments)    Stomach ulcers  . Sulfa Antibiotics Other (See Comments)    Stomach ulcers  . Morphine And Related Rash     Current Outpatient Medications  Medication Sig Dispense Refill  . amLODipine (NORVASC) 10 MG tablet Take 10 mg by mouth daily.     . calcitRIOL (ROCALTROL) 0.25 MCG capsule Take 1 capsule (0.25 mcg total) by mouth daily. 60 capsule 3  . cloNIDine (CATAPRES) 0.2 MG tablet Take 0.2 mg by mouth daily.    . Continuous Blood Gluc Receiver (FREESTYLE  LIBRE 14 DAY READER) DEVI 1 each by Does not apply route every 14 (fourteen) days. 1 Device 0  . Continuous Blood Gluc Sensor (FREESTYLE LIBRE 14 DAY SENSOR) MISC 1 each by Does not apply route every 14 (fourteen) days. 2 each 5  . Continuous Blood Gluc Sensor (FREESTYLE LIBRE SENSOR SYSTEM) MISC Use one sensor every 10 days. 3 each 2  . gabapentin (NEURONTIN) 300 MG capsule Take 1 capsule (300 mg total) by mouth at bedtime.    . Insulin Glargine (LANTUS) 100 UNIT/ML Solostar Pen Inject 20 Units into the skin daily at 10 pm.    . insulin regular (NOVOLIN R,HUMULIN R) 100 units/mL injection Inject 5-11 Units into the skin 3 (three) times daily before meals.    . metoCLOPramide (REGLAN) 10 MG tablet 10 mg 4 (four) times daily -  before meals and at bedtime.    . metoprolol tartrate (LOPRESSOR) 25 MG tablet Take 25 mg by mouth 2 (two) times daily.    . pantoprazole (PROTONIX) 40 MG tablet Take 1 tablet (40 mg total) by mouth 2 (two) times daily before a meal. 60 tablet 0  . potassium chloride SA (K-DUR,KLOR-CON) 20 MEQ tablet Take 1 tablet (20 mEq total) by mouth daily. 7 tablet 0   No current facility-administered medications for this visit.     ROS:   General:  No weight loss, Fever, chills  HEENT: No recent headaches, no nasal bleeding, no visual changes, no sore throat  Neurologic: No dizziness, blackouts, seizures. No recent symptoms of stroke or mini- stroke. No recent episodes of slurred speech, or temporary blindness.  Cardiac: No recent episodes of chest pain/pressure, no shortness of breath at rest.  No shortness of breath with exertion.  Denies history of atrial fibrillation or irregular heartbeat  Vascular: No history of rest pain in feet.  No history of claudication.  No history of non-healing ulcer, No history of DVT   Pulmonary: No home oxygen, no productive cough, no hemoptysis,  No asthma or wheezing  Musculoskeletal:  [ ]  Arthritis, [ ]  Low back pain,  [ ]  Joint  pain  Hematologic:No history of hypercoagulable state.  No history of easy bleeding.  No history of anemia  Gastrointestinal: No hematochezia or melena,  No gastroesophageal reflux, no trouble swallowing  Urinary: [ ]  chronic Kidney disease, [ ]  on HD - [ ]  MWF or [ ]  TTHS, [ ]  Burning with urination, [ ]  Frequent urination, [ ]  Difficulty urinating;   Skin: No rashes  Psychological: No history of anxiety,  No history of depression   Physical Examination  There were no vitals filed for this visit.  There is no height or weight on file to calculate BMI.  General:  Alert and oriented, no acute distress HEENT: Normal Neck: No bruit or JVD Pulmonary: Clear  to auscultation bilaterally Cardiac: Regular Rate and Rhythm without murmur Gastrointestinal: Soft, non-tender, non-distended, no mass, no scars Skin: No rash Extremity Pulses:  2+ radial, brachial pulses bilaterally Musculoskeletal: No deformity or edema  Neurologic: Upper and lower extremity motor 5/5 and symmetric  DATA:  UE vein mapping Right cephalic 3.57-0.17, Basilic slightly smaller  Left cephalic not adequate, left basilic 7.93-9.03 Radial arteries triphasic flow   ASSESSMENT:  CKD stage V not on HD   PLAN: He is right handed, but the left cephalic vein is not acceptable in size.   He has an acceptable left cephalic in the upper arm.  We will plan right brachial Cephalic UE av fistula verses graft.  Benefits, risks and surgical options were discussed with the patient with Dr. Donnetta Hutching today.    Roxy Horseman PA-C [facility]  The patient was seen in conjunction with Dr. Donnetta Hutching today  I have examined the patient, reviewed and agree with above.  Discussed options with the patient and his family present.  Does have an acceptable antecubital vein on the right by SonoSite ultrasound.  Extremely small forearm cephalic veins bilaterally.  He is right-handed.  Have recommended a  right upper arm AV fistula creation.  Explained the possibility of non-maturation and that he may addition need a prosthetic graft.  We will schedule this as an outpatient at [facility] [place], MD 03/20/2017 2:03 PM

## 2017-03-21 ENCOUNTER — Encounter: Payer: Self-pay | Admitting: Nephrology

## 2017-04-05 ENCOUNTER — Other Ambulatory Visit: Payer: Self-pay

## 2017-04-05 ENCOUNTER — Encounter (HOSPITAL_COMMUNITY): Payer: Self-pay | Admitting: *Deleted

## 2017-04-05 MED ORDER — FREESTYLE LIBRE 14 DAY READER DEVI: 1.0000 | 0 refills | Status: DC

## 2017-04-05 MED ORDER — FREESTYLE LIBRE 14 DAY SENSOR MISC: 1.0000 | 5 refills | Status: DC

## 2017-04-05 NOTE — Progress Notes (Signed)
Anesthesia Chart Review: [name].  Patient is a 32 year old male scheduled for right arm AVF on 04/09/17 by Dr. Curt Jews.  History includes CKD stage V (not yet on hemodialysis), bicuspid AV, murmur (mild MR, mild AS 02/2017 echo), smoking, DM1, diabetic neuropathy and gastroparesis, GERD, hiatal hernia, peptic ulcer disease, hypertension, hypercholesterolemia, anemia, insomnia, anxiety, depression, scoliosis, cholecystectomy. - Admit 02/18/17 - 02/23/17 (transferred from [facility] to [facility]) for DKA and acute respiratory failure secondary to CAP, probably aspiration. He was already intubated on mechanical ventilation and receiving antibiotic therapy. He was extubated 02/19/17.   - PCP is Dr. Talbert Forest Corrington. - Nephrologist is Dr. Fran Lowes ([facility]). Last visit 03/13/17. He referred patient for fistula creation. - Endocrinologist is Dr. Loni Beckwith, last visit 01/01/17. - According to 11/02/16 Cardiology notation ([place]), Based on in-patient TEE showing bicuspid AV, patient should get established with a cardiologist in the future to follow. It doesn't look like this has occurred yet.  Current medication list includes amlodipine, aspirin 81 mg, Coreg, clonidine patch, Neurontin, hydroxyzine, Lantus, insulin regular, Protonix, KCl, Zanaflex.  EKG 11/16/16: ST at 113, LAD, consider biatrial enlargement, minimal ST depression, diffuse leads, prolonged QT interval (ST 375, QTc 515 ms), Baseline wanderer in V5. ST changes are seen on prior EKG 08/25/16.  EKG tracing report from 02/19/17 [facility]):  Ventricular Rate 86 Atrial Rate 86 P-R Interval 175 QRS Duration 100 Q-T Interval 375 QTC Calculation([place]) 449 Calculated P Axis 55 Calculated R Axis 42 Calculated T Axis 46 Diagnosis Normal sinus rhythm Incomplete right bundle branch block Septal infarct , age undetermined When compared with ECG of 08-Apr-2012 06:35, Septal infarct is  now present T wave inversion no longer evident in Anterolateral leads. QT has shortened.  Transthoracic Echo 02/19/17 ([place]): Interpretation Summary Probable bicuspid aortic valve. There is mild (1+) tricuspid regurgitation. There is mild (1+) mitral regurgitation. There is mild aortic stenosis (aortic valve area >1.5cm2). Peak and mean gradients are 24 and 11 mm Hg. The left ventricle is normal in size. There is moderate concentric left ventricular hypertrophy. The left ventricular ejection fraction is normal (55-60%). The left ventricular wall motion is normal. The left ventricular diastolic function is normal. The left atrium is mildly dilated.  TEE 10/20/16 ([place]): SUMMARY The left ventricular size is normal. Left ventricular systolic function is normal. The right ventricle is normal in size and function. There is no Doppler evidence for a patent foramen ovale. Bicuspid aortic valve noted. There is systolic doming of the aortic valve  leaflets(s). The raphe' location is fusion of the right and left coronary  cusps. There is no aortic valvular vegetation. The mitral valve leaflets appear normal. There is no vegetation seen on the mitral valve. Structurally normal tricuspid valve. There is no tricuspid valve vegetation. The pulmonic valve is not well visualized. There is no pulmonic valvular regurgitation. The aortic root is normal size. No dilation of decending aorta. Aortic arch not well visualized. There is no pericardial effusion. There is no comparison study available.  PCXR 02/22/17 ([place]; done during admission for CAP/resp failure): IMPRESSION:  ETT and NG tube removed. Almost complete clearing bilateral infiltrates/edema.  He is for ISTAT on the day of surgery. Last A1c seen was 8.5 on 02/20/17 ([place]).   From records, it doesn't look like he has gotten established with cardiology to follow his bicuspid AV  with mild AS, but with imaging within the past year,  I anticipate that he can proceed as planned if otherwise no acute issues and labs acceptable. Anesthesiologist to evaluate on the day of surgery.  George Hugh [facility]/Anesthesiology Phone [phone] 04/05/2017 11:50 AM

## 2017-04-05 NOTE — Progress Notes (Signed)
Spoke with pt for pre-op call. 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 He states he has outgrown this. He denies any other cardiac history, chest pain or sob. Pt is a type 1 diabetic. Last A1C was 8.3 on 02/20/17. Pt states his fasting blood sugar is usually around 109.  Pt instructed to take 80% of his Lantus Insulin Sunday evening. Instructed pt to check his blood sugar Monday AM when he gets up. If blood sugar is >220 take 1/2 of usual correction dose of Novolin R insulin. If blood sugar is 70 or below, treat with 1/2 cup of clear juice (apple or cranberry) and recheck blood sugar 15 minutes after drinking juice. Pt voiced understanding. Also instructed pt not to smoke 24 hours prior to surgery. Pt states he will try, but can't promise that he won't.

## 2017-04-09 ENCOUNTER — Encounter (HOSPITAL_COMMUNITY): Payer: Self-pay | Admitting: *Deleted

## 2017-04-09 ENCOUNTER — Ambulatory Visit (HOSPITAL_COMMUNITY)
Admission: RE | Admit: 2017-04-09 | Discharge: 2017-04-09 | Disposition: A | Discharge status: Home / Self Care | Payer: Managed Care, Other (non HMO) | Source: Ambulatory Visit | Attending: Vascular Surgery | Admitting: Vascular Surgery

## 2017-04-09 ENCOUNTER — Ambulatory Visit (HOSPITAL_COMMUNITY): Payer: Managed Care, Other (non HMO) | Admitting: Vascular Surgery

## 2017-04-09 ENCOUNTER — Encounter (HOSPITAL_COMMUNITY)
Admission: RE | Disposition: A | Discharge status: Home / Self Care | Payer: Self-pay | Source: Ambulatory Visit | Attending: Vascular Surgery

## 2017-04-09 DIAGNOSIS — Z79899 Other long term (current) drug therapy: Secondary | ICD-10-CM | POA: Insufficient documentation

## 2017-04-09 DIAGNOSIS — Z885 Allergy status to narcotic agent status: Secondary | ICD-10-CM | POA: Diagnosis not present

## 2017-04-09 DIAGNOSIS — F17210 Nicotine dependence, cigarettes, uncomplicated: Secondary | ICD-10-CM | POA: Insufficient documentation

## 2017-04-09 DIAGNOSIS — I120 Hypertensive chronic kidney disease with stage 5 chronic kidney disease or end stage renal disease: Secondary | ICD-10-CM | POA: Insufficient documentation

## 2017-04-09 DIAGNOSIS — Q231 Congenital insufficiency of aortic valve: Secondary | ICD-10-CM | POA: Insufficient documentation

## 2017-04-09 DIAGNOSIS — E1043 Type 1 diabetes mellitus with diabetic autonomic (poly)neuropathy: Secondary | ICD-10-CM | POA: Insufficient documentation

## 2017-04-09 DIAGNOSIS — Z8711 Personal history of peptic ulcer disease: Secondary | ICD-10-CM | POA: Diagnosis not present

## 2017-04-09 DIAGNOSIS — I4510 Unspecified right bundle-branch block: Secondary | ICD-10-CM | POA: Insufficient documentation

## 2017-04-09 DIAGNOSIS — M419 Scoliosis, unspecified: Secondary | ICD-10-CM | POA: Insufficient documentation

## 2017-04-09 DIAGNOSIS — F329 Major depressive disorder, single episode, unspecified: Secondary | ICD-10-CM | POA: Diagnosis not present

## 2017-04-09 DIAGNOSIS — Z886 Allergy status to analgesic agent status: Secondary | ICD-10-CM | POA: Diagnosis not present

## 2017-04-09 DIAGNOSIS — E1022 Type 1 diabetes mellitus with diabetic chronic kidney disease: Secondary | ICD-10-CM | POA: Diagnosis not present

## 2017-04-09 DIAGNOSIS — N185 Chronic kidney disease, stage 5: Secondary | ICD-10-CM | POA: Insufficient documentation

## 2017-04-09 DIAGNOSIS — E7800 Pure hypercholesterolemia, unspecified: Secondary | ICD-10-CM | POA: Diagnosis not present

## 2017-04-09 DIAGNOSIS — K3184 Gastroparesis: Secondary | ICD-10-CM | POA: Insufficient documentation

## 2017-04-09 DIAGNOSIS — Z882 Allergy status to sulfonamides status: Secondary | ICD-10-CM | POA: Diagnosis not present

## 2017-04-09 DIAGNOSIS — Z888 Allergy status to other drugs, medicaments and biological substances status: Secondary | ICD-10-CM | POA: Insufficient documentation

## 2017-04-09 DIAGNOSIS — F419 Anxiety disorder, unspecified: Secondary | ICD-10-CM | POA: Diagnosis not present

## 2017-04-09 DIAGNOSIS — G4700 Insomnia, unspecified: Secondary | ICD-10-CM | POA: Diagnosis not present

## 2017-04-09 DIAGNOSIS — K219 Gastro-esophageal reflux disease without esophagitis: Secondary | ICD-10-CM | POA: Insufficient documentation

## 2017-04-09 DIAGNOSIS — R011 Cardiac murmur, unspecified: Secondary | ICD-10-CM | POA: Insufficient documentation

## 2017-04-09 DIAGNOSIS — Z794 Long term (current) use of insulin: Secondary | ICD-10-CM | POA: Insufficient documentation

## 2017-04-09 HISTORY — DX: Pneumonia, unspecified organism: J18.9

## 2017-04-09 HISTORY — DX: Chronic kidney disease, unspecified: N18.9

## 2017-04-09 HISTORY — DX: Bicuspid aortic valve: Q23.81

## 2017-04-09 HISTORY — PX: AV FISTULA PLACEMENT: SHX1204

## 2017-04-09 HISTORY — DX: Headache: R51

## 2017-04-09 HISTORY — DX: Personal history of other diseases of the digestive system: Z87.19

## 2017-04-09 HISTORY — DX: Congenital insufficiency of aortic valve: Q23.1

## 2017-04-09 HISTORY — DX: Depression, unspecified: F32.A

## 2017-04-09 HISTORY — DX: Headache, unspecified: R51.9

## 2017-04-09 HISTORY — DX: Anemia, unspecified: D64.9

## 2017-04-09 HISTORY — DX: Major depressive disorder, single episode, unspecified: F32.9

## 2017-04-09 LAB — GLUCOSE, CAPILLARY
Glucose-Capillary: 111 mg/dL — ABNORMAL HIGH (ref 65–99)
Glucose-Capillary: 163 mg/dL — ABNORMAL HIGH (ref 65–99)

## 2017-04-09 LAB — POCT I-STAT 4, (NA,K, GLUC, HGB,HCT)
Glucose, Bld: 117 mg/dL — ABNORMAL HIGH (ref 65–99)
HCT: 27 % — ABNORMAL LOW (ref 39.0–52.0)
Hemoglobin: 9.2 g/dL — ABNORMAL LOW (ref 13.0–17.0)
Potassium: 5.2 mmol/L — ABNORMAL HIGH (ref 3.5–5.1)
SODIUM: 136 mmol/L (ref 135–145)

## 2017-04-09 SURGERY — ARTERIOVENOUS (AV) FISTULA CREATION
Anesthesia: General | Laterality: Right

## 2017-04-09 MED ORDER — PROPOFOL 10 MG/ML IV BOLUS
INTRAVENOUS | Status: DC | PRN
  Administered 2017-04-09: 200 mg via INTRAVENOUS

## 2017-04-09 MED ORDER — LIDOCAINE 2% (20 MG/ML) 5 ML SYRINGE
INTRAMUSCULAR | Status: DC | PRN
  Administered 2017-04-09: 100 mg via INTRAVENOUS

## 2017-04-09 MED ORDER — SODIUM CHLORIDE 0.9 % IV SOLN
INTRAVENOUS | Status: AC
  Filled 2017-04-09: qty 1.5

## 2017-04-09 MED ORDER — SODIUM CHLORIDE 0.9 % IV SOLN
INTRAVENOUS | Status: DC | PRN
  Administered 2017-04-09: 1.5 g via INTRAVENOUS

## 2017-04-09 MED ORDER — SODIUM CHLORIDE 0.9 % IV SOLN
INTRAVENOUS | Status: DC
  Administered 2017-04-09: 07:00:00 via INTRAVENOUS

## 2017-04-09 MED ORDER — CHLORHEXIDINE GLUCONATE 4 % EX LIQD: 60.0000 mL | Freq: Once | CUTANEOUS | Status: DC

## 2017-04-09 MED ORDER — PHENYLEPHRINE HCL 10 MG/ML IJ SOLN
INTRAMUSCULAR | Status: DC | PRN
  Administered 2017-04-09: 50 ug/min via INTRAVENOUS

## 2017-04-09 MED ORDER — LIDOCAINE 2% (20 MG/ML) 5 ML SYRINGE
INTRAMUSCULAR | Status: AC
  Filled 2017-04-09: qty 5

## 2017-04-09 MED ORDER — SODIUM CHLORIDE 0.9 % IV SOLN
INTRAVENOUS | Status: DC | PRN
  Administered 2017-04-09: 08:00:00 10 mL

## 2017-04-09 MED ORDER — SODIUM CHLORIDE 0.9 % IV SOLN: 1.5000 g | INTRAVENOUS | Status: DC

## 2017-04-09 MED ORDER — ARTIFICIAL TEARS OPHTHALMIC OINT
TOPICAL_OINTMENT | OPHTHALMIC | Status: AC
  Filled 2017-04-09: qty 7

## 2017-04-09 MED ORDER — FENTANYL CITRATE (PF) 250 MCG/5ML IJ SOLN
INTRAMUSCULAR | Status: AC
  Filled 2017-04-09: qty 5

## 2017-04-09 MED ORDER — MIDAZOLAM HCL 2 MG/2ML IJ SOLN
INTRAMUSCULAR | Status: AC
  Filled 2017-04-09: qty 2

## 2017-04-09 MED ORDER — SUCCINYLCHOLINE CHLORIDE 20 MG/ML IJ SOLN
INTRAMUSCULAR | Status: DC | PRN
  Administered 2017-04-09: 120 mg via INTRAVENOUS

## 2017-04-09 MED ORDER — 0.9 % SODIUM CHLORIDE (POUR BTL) OPTIME
TOPICAL | Status: DC | PRN
  Administered 2017-04-09: 1000 mL

## 2017-04-09 MED ORDER — ONDANSETRON HCL 4 MG/2ML IJ SOLN
INTRAMUSCULAR | Status: AC
  Filled 2017-04-09: qty 2

## 2017-04-09 MED ORDER — CEFAZOLIN SODIUM-DEXTROSE 2-4 GM/100ML-% IV SOLN
2.0000 g | Freq: Once | INTRAVENOUS | Status: DC
  Filled 2017-04-09 (×2): qty 100

## 2017-04-09 MED ORDER — LIDOCAINE-EPINEPHRINE 0.5 %-1:200000 IJ SOLN
INTRAMUSCULAR | Status: DC | PRN
  Administered 2017-04-09: 4 mL

## 2017-04-09 MED ORDER — FENTANYL CITRATE (PF) 100 MCG/2ML IJ SOLN
INTRAMUSCULAR | Status: DC | PRN
  Administered 2017-04-09: 50 ug via INTRAVENOUS
  Administered 2017-04-09 (×2): 25 ug via INTRAVENOUS
  Administered 2017-04-09 (×2): 50 ug via INTRAVENOUS

## 2017-04-09 MED ORDER — OXYCODONE HCL 5 MG PO TABS: 5.0000 mg | ORAL_TABLET | Freq: Four times a day (QID) | ORAL | 0 refills | Status: DC | PRN

## 2017-04-09 MED ORDER — MIDAZOLAM HCL 5 MG/5ML IJ SOLN
INTRAMUSCULAR | Status: DC | PRN
  Administered 2017-04-09: 2 mg via INTRAVENOUS

## 2017-04-09 MED ORDER — LIDOCAINE-EPINEPHRINE 0.5 %-1:200000 IJ SOLN
INTRAMUSCULAR | Status: AC
  Filled 2017-04-09: qty 1

## 2017-04-09 SURGICAL SUPPLY — 32 items
ADH SKN CLS APL DERMABOND .7 (GAUZE/BANDAGES/DRESSINGS) ×1
ADH SKN CLS LQ APL DERMABOND (GAUZE/BANDAGES/DRESSINGS) ×1
ARMBAND PINK RESTRICT EXTREMIT (MISCELLANEOUS) ×6 IMPLANT
CANISTER SUCT 3000ML PPV (MISCELLANEOUS) ×3 IMPLANT
CANNULA VESSEL 3MM 2 BLNT TIP (CANNULA) ×3 IMPLANT
CLIP LIGATING EXTRA MED SLVR (CLIP) ×3 IMPLANT
CLIP LIGATING EXTRA SM BLUE (MISCELLANEOUS) ×3 IMPLANT
COVER PROBE W GEL 5X96 (DRAPES) ×3 IMPLANT
DECANTER SPIKE VIAL GLASS SM (MISCELLANEOUS) ×3 IMPLANT
DERMABOND ADHESIVE PROPEN (GAUZE/BANDAGES/DRESSINGS) ×2
DERMABOND ADVANCED (GAUZE/BANDAGES/DRESSINGS) ×2
DERMABOND ADVANCED .7 DNX12 (GAUZE/BANDAGES/DRESSINGS) ×1 IMPLANT
DERMABOND ADVANCED .7 DNX6 (GAUZE/BANDAGES/DRESSINGS) IMPLANT
ELECT REM PT RETURN 9FT ADLT (ELECTROSURGICAL) ×3
ELECTRODE REM PT RTRN 9FT ADLT (ELECTROSURGICAL) ×1 IMPLANT
GLOVE SS BIOGEL STRL SZ 7.5 (GLOVE) ×1 IMPLANT
GLOVE SUPERSENSE BIOGEL SZ 7.5 (GLOVE) ×4
GOWN STRL REUS W/ TWL LRG LVL3 (GOWN DISPOSABLE) ×3 IMPLANT
GOWN STRL REUS W/ TWL XL LVL3 (GOWN DISPOSABLE) IMPLANT
GOWN STRL REUS W/TWL LRG LVL3 (GOWN DISPOSABLE) ×6
GOWN STRL REUS W/TWL XL LVL3 (GOWN DISPOSABLE) ×3
KIT BASIN OR (CUSTOM PROCEDURE TRAY) ×3 IMPLANT
KIT ROOM TURNOVER OR (KITS) ×3 IMPLANT
NS IRRIG 1000ML POUR BTL (IV SOLUTION) ×3 IMPLANT
PACK CV ACCESS (CUSTOM PROCEDURE TRAY) ×3 IMPLANT
PAD ARMBOARD 7.5X6 YLW CONV (MISCELLANEOUS) ×6 IMPLANT
SUT PROLENE 6 0 CC (SUTURE) ×3 IMPLANT
SUT VIC AB 3-0 SH 27 (SUTURE) ×3
SUT VIC AB 3-0 SH 27X BRD (SUTURE) ×1 IMPLANT
TOWEL GREEN STERILE (TOWEL DISPOSABLE) ×3 IMPLANT
UNDERPAD 30X30 (UNDERPADS AND DIAPERS) ×3 IMPLANT
WATER STERILE IRR 1000ML POUR (IV SOLUTION) ×3 IMPLANT

## 2017-04-09 NOTE — Anesthesia Procedure Notes (Signed)
Procedure Name: Intubation Date/Time: 04/09/2017 7:44 AM Performed by: Lieutenant Diego, CRNA Pre-anesthesia Checklist: Patient identified, Emergency Drugs available, Suction available and Patient being monitored Patient Re-evaluated:Patient Re-evaluated prior to induction Oxygen Delivery Method: Circle system utilized Preoxygenation: Pre-oxygenation with 100% oxygen Induction Type: IV induction Ventilation: Mask ventilation without difficulty Laryngoscope Size: Miller and 2 Grade View: Grade I Tube type: Oral Tube size: 7.0 mm Number of attempts: 1 Airway Equipment and Method: Stylet and Oral airway Placement Confirmation: ETT inserted through vocal cords under direct vision,  positive ETCO2 and breath sounds checked- equal and bilateral Secured at: 22 cm Tube secured with: Tape Dental Injury: Teeth and Oropharynx as per pre-operative assessment

## 2017-04-09 NOTE — Transfer of Care (Signed)
Immediate Anesthesia Transfer of Care Note  Patient: Brent Daniels  Procedure(s) Performed: ARTERIOVENOUS (AV) FISTULA CREATION RIGHT ARM (Right )  Patient Location: PACU  Anesthesia Type:General  Level of Consciousness: awake and alert   Airway & Oxygen Therapy: Patient Spontanous Breathing and Patient connected to face mask oxygen  Post-op Assessment: Report given to RN and Post -op Vital signs reviewed and stable  Post vital signs: Reviewed and stable  Last Vitals:  Vitals:   04/09/17 0608 04/09/17 0852  Pulse: 89   Temp: 36.8 C (P) 36.6 C  SpO2: 97%     Last Pain:  Vitals:   04/09/17 0621  TempSrc:   PainSc: 8       Patients Stated Pain Goal: 7 (82/50/03 7048)  Complications: No apparent anesthesia complications

## 2017-04-09 NOTE — Op Note (Signed)
    OPERATIVE REPORT  DATE OF SURGERY: 04/09/2017  PATIENT: Brent Daniels, 32 y.o. male MRN: [id number]  DOB: Jun 09, 1985  PRE-OPERATIVE DIAGNOSIS: Chronic renal insufficiency  POST-OPERATIVE DIAGNOSIS:  Same  PROCEDURE: Right brachiocephalic AV fistula creation  SURGEON:  Curt Jews, M.D.  PHYSICIAN ASSISTANT: Matt Eveland PA-C  ANESTHESIA: General  EBL: Minimal ml  Total I/O In: 350 [I.V.:350] Out: 6 [Blood:6]  BLOOD ADMINISTERED: None  DRAINS: None  SPECIMEN: None  COUNTS CORRECT:  YES  PLAN OF CARE: PACU  PATIENT DISPOSITION:  PACU - hemodynamically stable  PROCEDURE DETAILS: The patient was taken to the operating placed supine position where the area of the right arm was prepped and draped in usual sterile fashion.  SonoSite ultrasound was used to visualize the antecubital vein was somewhat thickened but patent and of good caliber.  The cephalic vein above the antecubital space was of good caliber.  The forearm cephalic vein was very small.  The patient did have a large basilic vein above the elbow.  Patient had a normal brachial pulse and normal radial pulse.  Using local anesthesia in addition to the general anesthesia, an incision was made at the antecubital space and carried down to isolate the cephalic vein which was of good caliber and the brachial artery which had no evidence of atherosclerotic change.  Tributary branches of the cephalic vein were ligated and divided.  The vein was ligated distally and divided and mobilized to the level of the brachial artery.  The artery was occluded proximally distally and was opened with an 11 blade and extended longitudinally with Potts scissors.  The vein was cut to the appropriate length and was spatulated and sewn end-to-side to the artery with a running 6-0 Prolene suture.  Then for removed and excellent thrill was noted in the fistula.  The wound was irrigated with saline.  Hemostasis obtained with cautery.  The wound was  closed with 3-0 Vicryl in the subcutaneous and subcuticular tissue.  Sterile dressing was applied and the patient was transferred to the recovery room in stable condition.  The patient did maintain a right radial pulse with the fistula patent   Rosetta Posner, M.D., [facility] 04/09/2017 8:52 AM

## 2017-04-09 NOTE — Anesthesia Postprocedure Evaluation (Signed)
Anesthesia Post Note  Patient: Brent Daniels  Procedure(s) Performed: ARTERIOVENOUS (AV) FISTULA CREATION RIGHT ARM (Right )     Patient location during evaluation: PACU Anesthesia Type: General Level of consciousness: awake and alert Pain management: pain level controlled Vital Signs Assessment: post-procedure vital signs reviewed and stable Respiratory status: spontaneous breathing, nonlabored ventilation, respiratory function stable and patient connected to nasal cannula oxygen Cardiovascular status: blood pressure returned to baseline and stable Postop Assessment: no apparent nausea or vomiting Anesthetic complications: no    Last Vitals:  Vitals:   04/09/17 0907 04/09/17 0915  BP: (!) 159/97 (!) 176/99  Pulse: 91 91  Resp: 18 18  Temp:  36.6 C  SpO2: 94% 96%    Last Pain:  Vitals:   04/09/17 0621  TempSrc:   PainSc: 8                  Marquay Kruse COKER

## 2017-04-09 NOTE — Anesthesia Preprocedure Evaluation (Signed)
Anesthesia Evaluation  Patient identified by MRN, date of birth, ID band Patient awake    Reviewed: Allergy & Precautions, NPO status , Patient's Chart, lab work & pertinent test results  Airway Mallampati: III  TM Distance: >3 FB Neck ROM: Full    Dental  (+) Teeth Intact, Dental Advisory Given   Pulmonary Current Smoker,    breath sounds clear to auscultation       Cardiovascular hypertension,  Rhythm:Regular Rate:Normal     Neuro/Psych    GI/Hepatic   Endo/Other  diabetes  Renal/GU      Musculoskeletal   Abdominal   Peds  Hematology   Anesthesia Other Findings   Reproductive/Obstetrics                             Anesthesia Physical Anesthesia Plan  ASA: III  Anesthesia Plan: General   Post-op Pain Management:    Induction: Intravenous  PONV Risk Score and Plan: 1 and Ondansetron  Airway Management Planned: Oral ETT  Additional Equipment:   Intra-op Plan:   Post-operative Plan: Extubation in OR  Informed Consent: I have reviewed the patients History and Physical, chart, labs and discussed the procedure including the risks, benefits and alternatives for the proposed anesthesia with the patient or authorized representative who has indicated his/her understanding and acceptance.   Dental advisory given  Plan Discussed with: CRNA and Anesthesiologist  Anesthesia Plan Comments: (Diabetic gastroparesis ESRD not on HD K-5.2 Glucose 111  Plan GA with oral ETT)        Anesthesia Quick Evaluation

## 2017-04-09 NOTE — Interval H&P Note (Signed)
History and Physical Interval Note:  04/09/2017 7:18 AM  Brent Daniels  has presented today for surgery, with the diagnosis of CHRONIC KIDNEY DISEASE STAGE V FOR HEMODIALYSIS ACCESS  The various methods of treatment have been discussed with the patient and family. After consideration of risks, benefits and other options for treatment, the patient has consented to  Procedure(s): ARTERIOVENOUS (AV) FISTULA CREATION RIGHT ARM (Right) as a surgical intervention .  The patient's history has been reviewed, patient examined, no change in status, stable for surgery.  I have reviewed the patient's chart and labs.  Questions were answered to the patient's satisfaction.     Curt Jews

## 2017-04-09 NOTE — Discharge Instructions (Signed)
° °  [facility] ° °Discharge Instructions ° °AV Fistula or Graft Surgery for Dialysis Access ° °Please refer to the following instructions for your post-procedure care. Your surgeon or physician assistant will discuss any changes with you. ° °Activity ° °You may drive the day following your surgery, if you are comfortable and no longer taking prescription pain medication. Resume full activity as the soreness in your incision resolves. ° °Bathing/Showering ° °You may shower after you go home. Keep your incision dry for 48 hours. Do not soak in a bathtub, hot tub, or swim until the incision heals completely. You may not shower if you have a hemodialysis catheter. ° °Incision Care ° °Clean your incision with mild soap and water after 48 hours. Pat the area dry with a clean towel. You do not need a bandage unless otherwise instructed. Do not apply any ointments or creams to your incision. You may have skin glue on your incision. Do not peel it off. It will come off on its own in about one week. Your arm may swell a bit after surgery. To reduce swelling use pillows to elevate your arm so it is above your heart. Your doctor will tell you if you need to lightly wrap your arm with an ACE bandage. ° °Diet ° °Resume your normal diet. There are not special food restrictions following this procedure. In order to heal from your surgery, it is CRITICAL to get adequate nutrition. Your body requires vitamins, minerals, and protein. Vegetables are the best source of vitamins and minerals. Vegetables also provide the perfect balance of protein. Processed food has little nutritional value, so try to avoid this. ° °Medications ° °Resume taking all of your medications. If your incision is causing pain, you may take over-the counter pain relievers such as acetaminophen (Tylenol). If you were prescribed a stronger pain medication, please be aware these medications can cause nausea and constipation. Prevent  nausea by taking the medication with a snack or meal. Avoid constipation by drinking plenty of fluids and eating foods with high amount of fiber, such as fruits, vegetables, and grains. Do not take Tylenol if you are taking prescription pain medications. ° ° ° ° °Follow up °Your surgeon may want to see you in the office following your access surgery. If so, this will be arranged at the time of your surgery. ° °Please call us immediately for any of the following conditions: ° °Increased pain, redness, drainage (pus) from your incision site °Fever of 101 degrees or higher °Severe or worsening pain at your incision site °Hand pain or numbness. ° °Reduce your risk of vascular disease: ° °Stop smoking. If you would like help, call [facility] at 1-800-QUIT-NOW (1-800-784-8669) or [facility] at 336-586-4000 ° °Manage your cholesterol °Maintain a desired weight °Control your diabetes °Keep your blood pressure down ° °Dialysis ° °It will take several weeks to several months for your new dialysis access to be ready for use. Your surgeon will determine when it is OK to use it. Your nephrologist will continue to direct your dialysis. You can continue to use your Permcath until your new access is ready for use. ° °If you have any questions, please call the office at [phone]. ° °

## 2017-04-10 ENCOUNTER — Encounter (HOSPITAL_COMMUNITY): Payer: Self-pay | Admitting: Vascular Surgery

## 2017-04-16 ENCOUNTER — Telehealth: Payer: Self-pay | Admitting: Vascular Surgery

## 2017-04-16 NOTE — Telephone Encounter (Signed)
LVM for pt appt. 05/29/17 Mailed reminder letter

## 2017-04-16 NOTE — Telephone Encounter (Signed)
-----   Message from Mena Goes, RN sent at 04/09/2017  9:33 AM EST ----- Regarding: 4-6 weeks    ----- Message ----- From: Iline Oven Sent: 04/09/2017   8:42 AM To: [facility] Charge Pool  Can you schedule an appt for this pt in 4-6 weeks with Dr. Donnetta Hutching.  No need for fistula duplex.  PO R arm brachiocephalic. Thanks, [facility]

## 2017-05-28 ENCOUNTER — Encounter (HOSPITAL_COMMUNITY): Payer: Self-pay | Admitting: Emergency Medicine

## 2017-05-28 ENCOUNTER — Inpatient Hospital Stay (HOSPITAL_COMMUNITY)
Admission: EM | Admit: 2017-05-28 | Discharge: 2017-06-02 | DRG: 638 | Disposition: A | Discharge status: Home / Self Care | Payer: Managed Care, Other (non HMO) | Attending: Internal Medicine | Admitting: Internal Medicine

## 2017-05-28 ENCOUNTER — Other Ambulatory Visit: Payer: Self-pay

## 2017-05-28 DIAGNOSIS — M419 Scoliosis, unspecified: Secondary | ICD-10-CM | POA: Diagnosis present

## 2017-05-28 DIAGNOSIS — K219 Gastro-esophageal reflux disease without esophagitis: Secondary | ICD-10-CM | POA: Diagnosis present

## 2017-05-28 DIAGNOSIS — R112 Nausea with vomiting, unspecified: Secondary | ICD-10-CM

## 2017-05-28 DIAGNOSIS — E7800 Pure hypercholesterolemia, unspecified: Secondary | ICD-10-CM | POA: Diagnosis present

## 2017-05-28 DIAGNOSIS — N179 Acute kidney failure, unspecified: Secondary | ICD-10-CM

## 2017-05-28 DIAGNOSIS — R109 Unspecified abdominal pain: Secondary | ICD-10-CM | POA: Diagnosis present

## 2017-05-28 DIAGNOSIS — E559 Vitamin D deficiency, unspecified: Secondary | ICD-10-CM | POA: Diagnosis present

## 2017-05-28 DIAGNOSIS — Z7982 Long term (current) use of aspirin: Secondary | ICD-10-CM

## 2017-05-28 DIAGNOSIS — Z8719 Personal history of other diseases of the digestive system: Secondary | ICD-10-CM

## 2017-05-28 DIAGNOSIS — E1010 Type 1 diabetes mellitus with ketoacidosis without coma: Principal | ICD-10-CM

## 2017-05-28 DIAGNOSIS — K3184 Gastroparesis: Secondary | ICD-10-CM | POA: Diagnosis present

## 2017-05-28 DIAGNOSIS — G8929 Other chronic pain: Secondary | ICD-10-CM | POA: Diagnosis present

## 2017-05-28 DIAGNOSIS — Z886 Allergy status to analgesic agent status: Secondary | ICD-10-CM

## 2017-05-28 DIAGNOSIS — R1110 Vomiting, unspecified: Secondary | ICD-10-CM

## 2017-05-28 DIAGNOSIS — Z882 Allergy status to sulfonamides status: Secondary | ICD-10-CM

## 2017-05-28 DIAGNOSIS — R1013 Epigastric pain: Secondary | ICD-10-CM

## 2017-05-28 DIAGNOSIS — E1110 Type 2 diabetes mellitus with ketoacidosis without coma: Secondary | ICD-10-CM | POA: Diagnosis present

## 2017-05-28 DIAGNOSIS — Z9049 Acquired absence of other specified parts of digestive tract: Secondary | ICD-10-CM

## 2017-05-28 DIAGNOSIS — Z885 Allergy status to narcotic agent status: Secondary | ICD-10-CM

## 2017-05-28 DIAGNOSIS — F17210 Nicotine dependence, cigarettes, uncomplicated: Secondary | ICD-10-CM | POA: Diagnosis present

## 2017-05-28 DIAGNOSIS — F419 Anxiety disorder, unspecified: Secondary | ICD-10-CM | POA: Diagnosis present

## 2017-05-28 DIAGNOSIS — Z794 Long term (current) use of insulin: Secondary | ICD-10-CM

## 2017-05-28 DIAGNOSIS — Z888 Allergy status to other drugs, medicaments and biological substances status: Secondary | ICD-10-CM

## 2017-05-28 DIAGNOSIS — Z811 Family history of alcohol abuse and dependence: Secondary | ICD-10-CM

## 2017-05-28 DIAGNOSIS — Z809 Family history of malignant neoplasm, unspecified: Secondary | ICD-10-CM

## 2017-05-28 DIAGNOSIS — E1065 Type 1 diabetes mellitus with hyperglycemia: Secondary | ICD-10-CM | POA: Diagnosis present

## 2017-05-28 DIAGNOSIS — M549 Dorsalgia, unspecified: Secondary | ICD-10-CM | POA: Diagnosis present

## 2017-05-28 DIAGNOSIS — G4700 Insomnia, unspecified: Secondary | ICD-10-CM | POA: Diagnosis present

## 2017-05-28 DIAGNOSIS — E1022 Type 1 diabetes mellitus with diabetic chronic kidney disease: Secondary | ICD-10-CM | POA: Diagnosis present

## 2017-05-28 DIAGNOSIS — Z9114 Patient's other noncompliance with medication regimen: Secondary | ICD-10-CM

## 2017-05-28 DIAGNOSIS — N189 Chronic kidney disease, unspecified: Secondary | ICD-10-CM

## 2017-05-28 DIAGNOSIS — D631 Anemia in chronic kidney disease: Secondary | ICD-10-CM | POA: Diagnosis present

## 2017-05-28 DIAGNOSIS — E1043 Type 1 diabetes mellitus with diabetic autonomic (poly)neuropathy: Secondary | ICD-10-CM | POA: Diagnosis present

## 2017-05-28 DIAGNOSIS — M1990 Unspecified osteoarthritis, unspecified site: Secondary | ICD-10-CM | POA: Diagnosis present

## 2017-05-28 DIAGNOSIS — N185 Chronic kidney disease, stage 5: Secondary | ICD-10-CM | POA: Diagnosis present

## 2017-05-28 DIAGNOSIS — K920 Hematemesis: Secondary | ICD-10-CM

## 2017-05-28 DIAGNOSIS — K449 Diaphragmatic hernia without obstruction or gangrene: Secondary | ICD-10-CM | POA: Diagnosis present

## 2017-05-28 DIAGNOSIS — E875 Hyperkalemia: Secondary | ICD-10-CM

## 2017-05-28 DIAGNOSIS — E869 Volume depletion, unspecified: Secondary | ICD-10-CM | POA: Diagnosis not present

## 2017-05-28 DIAGNOSIS — Q231 Congenital insufficiency of aortic valve: Secondary | ICD-10-CM

## 2017-05-28 DIAGNOSIS — F1120 Opioid dependence, uncomplicated: Secondary | ICD-10-CM | POA: Diagnosis present

## 2017-05-28 DIAGNOSIS — Z8711 Personal history of peptic ulcer disease: Secondary | ICD-10-CM

## 2017-05-28 LAB — BLOOD GAS, VENOUS
ACID-BASE DEFICIT: 12.4 mmol/L — AB (ref 0.0–2.0)
Bicarbonate: 14.7 mmol/L — ABNORMAL LOW (ref 20.0–28.0)
Drawn by: 3706
PCO2 VEN: 32.2 mmHg — AB (ref 44.0–60.0)
pH, Ven: 7.245 — ABNORMAL LOW (ref 7.250–7.430)

## 2017-05-28 LAB — CBG MONITORING, ED: Glucose-Capillary: >600 mg/dL (ref 65–99)

## 2017-05-28 LAB — CBC
HEMATOCRIT: 31.9 % — AB (ref 39.0–52.0)
Hemoglobin: 9.5 g/dL — ABNORMAL LOW (ref 13.0–17.0)
MCH: 27.4 pg (ref 26.0–34.0)
MCHC: 29.8 g/dL — AB (ref 30.0–36.0)
MCV: 91.9 fL (ref 78.0–100.0)
Platelets: 480 10*3/uL — ABNORMAL HIGH (ref 150–400)
RBC: 3.47 MIL/uL — ABNORMAL LOW (ref 4.22–5.81)
RDW: 15.9 % — AB (ref 11.5–15.5)
WBC: 24.4 10*3/uL — AB (ref 4.0–10.5)

## 2017-05-28 MED ORDER — SODIUM CHLORIDE 0.9 % IV BOLUS
1000.0000 mL | Freq: Once | INTRAVENOUS | Status: AC
  Administered 2017-05-28: 1000 mL via INTRAVENOUS

## 2017-05-28 MED ORDER — PANTOPRAZOLE SODIUM 40 MG IV SOLR
40.0000 mg | Freq: Once | INTRAVENOUS | Status: AC
  Administered 2017-05-28: 40 mg via INTRAVENOUS
  Filled 2017-05-28: qty 40

## 2017-05-28 NOTE — ED Triage Notes (Signed)
Pt made ivc by mother due to pt not taking meds for hyperglycemia. Pt denies any si ideations. Pt has been vomiting x 3 days. Ems states blood sugar reads high.

## 2017-05-28 NOTE — ED Notes (Signed)
Pt has been wanded. Lab in room

## 2017-05-28 NOTE — ED Notes (Signed)
CBG >600 °

## 2017-05-28 NOTE — ED Provider Notes (Addendum)
[facility] EMERGENCY DEPARTMENT Provider Note   CSN: [id number] Arrival date & time: 05/28/17  2202     History   Chief Complaint Chief Complaint  Patient presents with  . IVC  . Hyperglycemia    HPI Brent Daniels is a 32 y.o. male.  HPI  This is a 32 year old male with a history of diabetes, GERD diabetic gastroparesis, erosive esophagitis who presents under IVC with hyperglycemia.  Per IVC paperwork, patient is not taking his insulin at home.  Patient reports several day history of nausea and vomiting.  Reports his blood sugars have been "high" at home.  He reports that he is taking his insulin as directed; however, IVC paperwork would suggest otherwise.  Patient denies any SI or HI.  He is reporting abdominal pain.  Reports a history of peptic ulcers.  He has noted some blood in his vomit.  Pain is epigastric and nonradiating.  He has not taken anything for his pain.  He denies any chest pain, shortness of breath, fevers.  Patient is generally uncooperative with history taking.  Level 5 caveat for acuity of condition.  Past Medical History:  Diagnosis Date  . Acute esophagitis   . Anemia   . Anxiety   . Arthritis   . Bicuspid aortic valve    noted on 10/20/16 [facility])  . Chronic abdominal pain   . Chronic back pain   . Chronic kidney disease    Stage 5 Kidney disease  . Depression   . Diabetes mellitus    type 1  . Diabetic gastroparesis associated with type 1 diabetes mellitus ([place])   . Erosive esophagitis 12/23/2013   "severe" per EGD   . Gastroparesis   . GERD (gastroesophageal reflux disease)   . Headache    in the past  . Heart murmur    mild TR, mild MR, mild AS 02/19/17  . History of hiatal hernia   . Hypercholesteremia   . Hypertension   . Insomnia   . Nausea and vomiting    chronic, recurrent  . Neuropathy   . Pneumonia   . PUD (peptic ulcer disease)   . S/P arthroscopic knee surgery 07/04/2011  . Scoliosis 07/11/2012    Patient Active  Problem List   Diagnosis Date Noted  . Current smoker 01/01/2017  . Acute renal failure ([place])   . ATN (acute tubular necrosis) ([place])   . Erosive esophagitis   . Uremia 11/16/2016  . Neuropathy 11/16/2016  . Vitamin D deficiency 11/16/2016  . CKD (chronic kidney disease), stage V ([place]) 11/16/2016  . Acute renal failure superimposed on stage 3 chronic kidney disease ([place]) 08/27/2016  . Intractable vomiting   . Volume depletion 08/24/2016  . Narcotic withdrawal ([place]) 08/24/2016  . Hypochloremic alkalosis 08/24/2016  . Diabetic hyperosmolar non-ketotic state ([place]) 08/24/2016  . Chronic abdominal pain 08/24/2016  . Diabetic gastroparesis ([place]) 08/24/2016  . Uncontrolled type 1 diabetes mellitus with hyperglycemia, with long-term current use of insulin ([place]) 08/24/2016  . Dehydration   . Reflux esophagitis 05/14/2015  . Nausea & vomiting 11/13/2014  . AKI (acute kidney injury) ([place]) 11/13/2014  . Hyperglycemia due to type 1 diabetes mellitus ([place]) 04/24/2014  . RUQ abdominal pain 04/14/2014  . DKA (diabetic ketoacidoses) ([place]) 04/03/2014  . Gastroparesis 03/30/2014  . Drug abuse ([place]) 03/30/2014  . Tobacco use 03/30/2014  . HLD (hyperlipidemia) 03/30/2014  . Oropharyngeal candidiasis   . Metabolic acidosis   . Abdominal fluid collection   . Abdominal pain,  acute   . Esophagitis 01/03/2014  . Nausea with vomiting   . Acute esophagitis   . Hyperglycemia 12/20/2013  . Abdominal pain 12/09/2013  . PUD (peptic ulcer disease) 10/29/2013  . Leukocytosis 09/29/2013  . Sinus tachycardia 09/29/2013  . Intractable nausea and vomiting 09/28/2013  . Chronic generalized abdominal pain 09/28/2013  . Diabetic gastroparesis associated with type 1 diabetes mellitus ([place]) 09/28/2013  . Hematemesis 09/28/2013  . Diabetic neuropathy ([place]) 09/28/2013  . Abdominal pain, epigastric 08/19/2013  . Uncontrolled diabetes mellitus ([place]) 06/15/2013  . GERD (gastroesophageal reflux disease) 06/15/2013    . DKA, type 1 ([place]) 06/15/2013  . Essential hypertension, benign 07/11/2012  . Diabetes mellitus with stage 5 chronic kidney disease ([place]) 07/11/2012  . Scoliosis 07/11/2012  . Back pain 07/11/2012  . S/P arthroscopic knee surgery 07/04/2011    Past Surgical History:  Procedure Laterality Date  . AV FISTULA PLACEMENT Right 04/09/2017   Procedure: ARTERIOVENOUS (AV) FISTULA CREATION RIGHT ARM;  Surgeon: Rosetta Posner, MD;  Location: [facility];  Service: Vascular;  Laterality: Right;  . BIOPSY  06/17/2013   Procedure: GASTRIC ULCER AND ANTRAL BIOPSIES; ESOPHAGEAL BRUSHING;  Surgeon: Danie Binder, MD;  Location: [facility];  Service: Endoscopy;;  . BIOPSY N/A 12/23/2013   Procedure: ESOPHAGEAL BIOPSY;  Surgeon: Daneil Dolin, MD;  Location: [facility];  Service: Endoscopy;  Laterality: N/A;  . CHOLECYSTECTOMY N/A 01/28/2014   Procedure: LAPAROSCOPIC CHOLECYSTECTOMY;  Surgeon: Jamesetta So, MD;  Location: [facility];  Service: General;  Laterality: N/A;  . ESOPHAGOGASTRODUODENOSCOPY (EGD) WITH PROPOFOL  06/17/2013   Dr. Oneida Alar: two gastric ulcers in fundus, mild antral gastritis, candida esophagitis  . ESOPHAGOGASTRODUODENOSCOPY (EGD) WITH PROPOFOL N/A 09/11/2013   Dr. [name]: abnormal hypopharynx, question massively enlarged tonsils, stomach full of food precluded exam, needs EGD for verification of ulcer healing at a later date  . ESOPHAGOGASTRODUODENOSCOPY (EGD) WITH PROPOFOL N/A 12/23/2013   RMR: Severe exudative esophagitis likely predominatly reflux related. Superimposed Candida infection not excluded status post KOH brushing and biopsy. Localized excoriating gastric mucosa most consistant with trauma(vomiting). No evidence of peptic ulcer disease or other gastric/duodenal pathology. I suspect severe inflammation involving the distal esophagus may account  for at least some of patii  . KNEE ARTHROSCOPY  06/30/2011   Procedure: ARTHROSCOPY KNEE;  Surgeon: Carole Civil, MD;  Location: [facility];  Service:  Orthopedics;  Laterality: Right;  diagnostic arthroscopy  . TYMPANOSTOMY TUBE PLACEMENT          Home Medications    Prior to Admission medications   Medication Sig Start Date End Date Taking? Authorizing Provider  aspirin EC 81 MG tablet Take 81 mg by mouth daily.   Yes [provider]  carvedilol (COREG) 12.5 MG tablet Take 12.5 mg by mouth 2 (two) times daily with a meal.   Yes [provider]  amLODipine (NORVASC) 10 MG tablet Take 10 mg by mouth daily.  05/08/15   [provider]  calcitRIOL (ROCALTROL) 0.25 MCG capsule Take 1 capsule (0.25 mcg total) by mouth daily. Patient not taking: Reported on 04/04/2017 01/01/17   Cassandria Anger, MD  cloNIDine (CATAPRES - DOSED IN MG/24 HR) 0.2 mg/24hr patch Place 0.2 mg onto the skin every Monday. 12/29/16   [provider]  Continuous Blood Gluc Receiver (FREESTYLE LIBRE 14 DAY READER) [place] 1 each by Does not apply route every 14 (fourteen) days. 04/05/17   Cassandria Anger, MD  Continuous Blood Gluc Sensor (FREESTYLE LIBRE 14  DAY SENSOR) MISC 1 each by Does not apply route every 14 (fourteen) days. 04/05/17   Cassandria Anger, MD  Continuous Blood Gluc Sensor (FREESTYLE LIBRE SENSOR SYSTEM) MISC Use one sensor every 10 days. 01/01/17   Cassandria Anger, MD  gabapentin (NEURONTIN) 300 MG capsule Take 1 capsule (300 mg total) by mouth at bedtime. Patient taking differently: Take 300 mg by mouth 3 (three) times daily.  11/19/16   Johnson, Clanford L, MD  hydrOXYzine (VISTARIL) 25 MG capsule Take 25 mg by mouth 3 (three) times daily as needed. For itching/anxiety. 02/08/17   [provider]  Insulin Glargine (LANTUS) 100 UNIT/ML Solostar Pen Inject 15 Units into the skin at bedtime.     [provider]  insulin regular (NOVOLIN R,HUMULIN R) 100 units/mL injection Inject 0-16 Units into the skin 3 (three) times daily before meals. Sliding Scale Insulin  Is BLOOD SUGAR READING -  100/30 (BLOOD SUGAR MINUS ONE HUNDRED DIVIDED BY THIRTY)    [provider]  ondansetron (ZOFRAN-ODT) 8 MG disintegrating tablet Take 8 mg by mouth every 6 (six) hours as needed for nausea/vomiting. 02/08/17   [provider]  oxyCODONE (ROXICODONE) 5 MG immediate release tablet Take 1 tablet (5 mg total) by mouth every 6 (six) hours as needed. 04/09/17 04/09/18  Dagoberto Ligas, PA-C  pantoprazole (PROTONIX) 40 MG tablet Take 1 tablet (40 mg total) by mouth 2 (two) times daily before a meal. Patient taking differently: Take 40 mg by mouth daily.  11/19/16 04/04/18  Johnson, Clanford L, MD  potassium chloride SA (K-DUR,KLOR-CON) 20 MEQ tablet Take 1 tablet (20 mEq total) by mouth daily. 11/19/16 11/26/16  Johnson, Clanford L, MD  tiZANidine (ZANAFLEX) 4 MG tablet Take 4 mg by mouth 2 (two) times daily.    [provider]    Family History Family History  Problem Relation Age of Onset  . Cancer Father   . Alcohol abuse Father   . Pseudochol deficiency Neg Hx   . Malignant hyperthermia Neg Hx   . Hypotension Neg Hx   . Anesthesia problems Neg Hx   . Colon cancer Neg Hx     Social History Social History   Tobacco Use  . Smoking status: Current Some Day Smoker    Packs/day: 0.25    Years: 10.00    Pack years: 2.50    Types: Cigarettes  . Smokeless tobacco: Former Systems developer    Quit date: 02/17/2013  . Tobacco comment: smokes 4 cigarettes a day   Substance Use Topics  . Alcohol use: No  . Drug use: No     Allergies   Hydrocodone; Hydrocodone-acetaminophen; Tramadol; Ibuprofen; Morphine and related; Naproxen; Sulfa antibiotics; Acetaminophen; Ketorolac; and Nsaids   Review of Systems Review of Systems  Constitutional: Negative for fever.  Respiratory: Negative for shortness of breath.   Cardiovascular: Negative for chest pain.  Gastrointestinal: Positive for abdominal pain, nausea and vomiting.  Neurological: Negative for headaches.  All other systems  reviewed and are negative.    Physical Exam Updated Vital Signs BP (!) 157/64 (BP Location: Right Arm)   Pulse (!) 119   Temp 97.6 F (36.4 C) (Axillary)   Resp (!) 22   SpO2 100%   Physical Exam  Constitutional: He is oriented to person, place, and time.  Ill-appearing, disheveled  HENT:  Head: Normocephalic and atraumatic.  Mucous membranes dry  Eyes: Pupils are equal, round, and reactive to light.  Cardiovascular: Regular rhythm and normal heart sounds.  No murmur heard. Tachycardia  Pulmonary/Chest: Effort normal and breath sounds normal. No respiratory distress. He has no wheezes.  Abdominal: Soft. Bowel sounds are normal. There is tenderness. There is no rebound.  Epigastric tenderness palpation, no rebound  Musculoskeletal: He exhibits no edema.  Neurological: He is alert and oriented to person, place, and time.  Skin: Skin is warm and dry.  Psychiatric: He has a normal mood and affect.  Nursing note and vitals reviewed.    ED Treatments / Results  Labs (all labs ordered are listed, but only abnormal results are displayed) Labs Reviewed  BASIC METABOLIC PANEL - Abnormal; Notable for the following components:      Result Value   Sodium 133 (*)    Potassium 6.2 (*)    Chloride 78 (*)    CO2 13 (*)    Glucose, Bld 889 (*)    BUN 67 (*)    Creatinine, Ser 7.60 (*)    GFR calc non Af Amer 8 (*)    GFR calc Af Amer 10 (*)    Anion gap >20 (*)    All other components within normal limits  CBC - Abnormal; Notable for the following components:   WBC 24.4 (*)    RBC 3.47 (*)    Hemoglobin 9.5 (*)    HCT 31.9 (*)    MCHC 29.8 (*)    RDW 15.9 (*)    Platelets 480 (*)    All other components within normal limits  BLOOD GAS, VENOUS - Abnormal; Notable for the following components:   pH, Ven 7.245 (*)    pCO2, Ven 32.2 (*)    Bicarbonate 14.7 (*)    Acid-base deficit 12.4 (*)    All other components within normal limits  CBG MONITORING, ED - Abnormal; Notable  for the following components:   Glucose-Capillary >600 (*)    All other components within normal limits  URINALYSIS, ROUTINE W REFLEX MICROSCOPIC  TYPE AND SCREEN    EKG EKG Interpretation  Date/Time:  Monday May 28 2017 22:15:42 EDT Ventricular Rate:  114 PR Interval:    QRS Duration: 109 QT Interval:  352 QTC Calculation: 485 R Axis:   90 Text Interpretation:  Sinus tachycardia LAE, consider biatrial enlargement Borderline right axis deviation Borderline prolonged QT interval Artifact No significant change was found Confirmed by Shanon Rosser [phone]) on 05/28/2017 10:19:37 PM   Radiology No results found.  Procedures Procedures (including critical care time)  Angiocath insertion Performed by: Merryl Hacker  Consent: Verbal consent obtained. Risks and benefits: risks, benefits and alternatives were discussed Time out: Immediately prior to procedure a "time out" was called to verify the correct patient, procedure, equipment, support staff and site/side marked as required.  Preparation: Patient was prepped and draped in the usual sterile fashion.  Vein Location: left basilic  Ultrasound Guided  Gauge: 20  Normal blood return and flush without difficulty Patient tolerance: Patient tolerated the procedure well with no immediate complications.   CRITICAL CARE Performed by: Merryl Hacker   Total critical care time: 40 minutes  Critical care time was exclusive of separately billable procedures and treating other patients.  Critical care was necessary to treat or prevent imminent or life-threatening deterioration.  Critical care was time spent personally by me on the following activities: development of treatment plan with patient and/or surrogate as well as nursing, discussions with consultants, evaluation of patient's response to treatment, examination of patient, obtaining history from patient or surrogate, ordering and  performing treatments and  interventions, ordering and review of laboratory studies, ordering and review of radiographic studies, pulse oximetry and re-evaluation of patient's condition.   Medications Ordered in ED Medications  insulin regular (NOVOLIN R,HUMULIN R) 100 Units in sodium chloride 0.9 % 100 mL (1 Units/mL) infusion (4.4 Units/hr Intravenous New Bag/Given 05/29/17 0114)  dextrose 5 %-0.45 % sodium chloride infusion (has no administration in time range)  sodium chloride 0.9 % bolus 2,000 mL (has no administration in time range)  ondansetron (ZOFRAN) injection 4 mg (has no administration in time range)  ondansetron (ZOFRAN) 4 MG/2ML injection (has no administration in time range)  ondansetron (ZOFRAN) injection 4 mg (has no administration in time range)  sodium chloride 0.9 % bolus 1,000 mL (0 mLs Intravenous Stopped 05/29/17 0045)  pantoprazole (PROTONIX) injection 40 mg (40 mg Intravenous Given 05/28/17 2341)  insulin aspart (novoLOG) injection 10 Units (10 Units Intravenous Given 05/29/17 0106)     Initial Impression / Assessment and Plan / ED Course  I have reviewed the triage vital signs and the nursing notes.  Pertinent labs & imaging results that were available during my care of the patient were reviewed by me and considered in my medical decision making (see chart for details).     Patient presents under IVC.  Notably hyperglycemic.  Reportedly not taking his insulin at home.  He reports 3 days of nausea and vomiting.  He is ill-appearing on exam.  Tachycardic with dry mucous membranes.  He also has vomiting and reports hematemesis.  History of peptic ulcers.  Initial CBG read as high.  Fluids ordered.  Lab work obtained.Patient initially ripped out his IV.  He is requesting something to drink and will not allow IV placement without something to drink.  He was allowed to drink and IV was replaced.  BMP with sugar of 889 anion gap greater than 20.  He does have a leukocytosis.  Suspect this is related to  his acute diabetic ketoacidosis; however, will obtain a CT scan to rule out any other intra-abdominal process.  He is clinically very dry.  Was informed by nursing that he only has one hand IV.  Second IV was placed for more aggressive fluid resuscitation.  Glucose stabilizer ordered.  ABG does show a metabolic acidosis with a pH of 7.24.  He also has acute increase and creatinine.  Patient is not currently on dialysis.  He will need aggressive fluid resuscitation and hyperglycemic control.  He was given Zofran and IV Protonix as well.  Final Clinical Impressions(s) / ED Diagnoses   Final diagnoses:  Diabetic ketoacidosis without coma associated with type 1 diabetes mellitus ([place])  Epigastric pain  Hematemesis with nausea    ED Discharge Orders    None       Kaiyden Simkin, Barbette Hair, MD 05/29/17 0147    Merryl Hacker, MD 06/12/17 [phone]

## 2017-05-29 ENCOUNTER — Encounter (HOSPITAL_COMMUNITY): Payer: Self-pay | Admitting: Internal Medicine

## 2017-05-29 ENCOUNTER — Encounter: Payer: Managed Care, Other (non HMO) | Admitting: Vascular Surgery

## 2017-05-29 ENCOUNTER — Emergency Department (HOSPITAL_COMMUNITY): Payer: Managed Care, Other (non HMO)

## 2017-05-29 DIAGNOSIS — N179 Acute kidney failure, unspecified: Secondary | ICD-10-CM

## 2017-05-29 DIAGNOSIS — R109 Unspecified abdominal pain: Secondary | ICD-10-CM

## 2017-05-29 DIAGNOSIS — Q231 Congenital insufficiency of aortic valve: Secondary | ICD-10-CM | POA: Diagnosis not present

## 2017-05-29 DIAGNOSIS — F1120 Opioid dependence, uncomplicated: Secondary | ICD-10-CM | POA: Diagnosis present

## 2017-05-29 DIAGNOSIS — Z7982 Long term (current) use of aspirin: Secondary | ICD-10-CM | POA: Diagnosis not present

## 2017-05-29 DIAGNOSIS — K3184 Gastroparesis: Secondary | ICD-10-CM | POA: Diagnosis present

## 2017-05-29 DIAGNOSIS — M549 Dorsalgia, unspecified: Secondary | ICD-10-CM | POA: Diagnosis present

## 2017-05-29 DIAGNOSIS — N185 Chronic kidney disease, stage 5: Secondary | ICD-10-CM | POA: Diagnosis not present

## 2017-05-29 DIAGNOSIS — F17210 Nicotine dependence, cigarettes, uncomplicated: Secondary | ICD-10-CM | POA: Diagnosis present

## 2017-05-29 DIAGNOSIS — E559 Vitamin D deficiency, unspecified: Secondary | ICD-10-CM | POA: Diagnosis present

## 2017-05-29 DIAGNOSIS — R1013 Epigastric pain: Secondary | ICD-10-CM | POA: Diagnosis present

## 2017-05-29 DIAGNOSIS — K449 Diaphragmatic hernia without obstruction or gangrene: Secondary | ICD-10-CM | POA: Diagnosis present

## 2017-05-29 DIAGNOSIS — N170 Acute kidney failure with tubular necrosis: Secondary | ICD-10-CM | POA: Diagnosis not present

## 2017-05-29 DIAGNOSIS — K920 Hematemesis: Secondary | ICD-10-CM | POA: Diagnosis present

## 2017-05-29 DIAGNOSIS — N184 Chronic kidney disease, stage 4 (severe): Secondary | ICD-10-CM | POA: Diagnosis not present

## 2017-05-29 DIAGNOSIS — E875 Hyperkalemia: Secondary | ICD-10-CM | POA: Diagnosis not present

## 2017-05-29 DIAGNOSIS — Z794 Long term (current) use of insulin: Secondary | ICD-10-CM | POA: Diagnosis not present

## 2017-05-29 DIAGNOSIS — E1010 Type 1 diabetes mellitus with ketoacidosis without coma: Principal | ICD-10-CM

## 2017-05-29 DIAGNOSIS — G8929 Other chronic pain: Secondary | ICD-10-CM | POA: Diagnosis present

## 2017-05-29 DIAGNOSIS — Z809 Family history of malignant neoplasm, unspecified: Secondary | ICD-10-CM | POA: Diagnosis not present

## 2017-05-29 DIAGNOSIS — K219 Gastro-esophageal reflux disease without esophagitis: Secondary | ICD-10-CM | POA: Diagnosis present

## 2017-05-29 DIAGNOSIS — M1990 Unspecified osteoarthritis, unspecified site: Secondary | ICD-10-CM | POA: Diagnosis present

## 2017-05-29 DIAGNOSIS — G4700 Insomnia, unspecified: Secondary | ICD-10-CM | POA: Diagnosis present

## 2017-05-29 DIAGNOSIS — M419 Scoliosis, unspecified: Secondary | ICD-10-CM | POA: Diagnosis present

## 2017-05-29 DIAGNOSIS — E1065 Type 1 diabetes mellitus with hyperglycemia: Secondary | ICD-10-CM | POA: Diagnosis not present

## 2017-05-29 DIAGNOSIS — N178 Other acute kidney failure: Secondary | ICD-10-CM | POA: Diagnosis not present

## 2017-05-29 DIAGNOSIS — E7800 Pure hypercholesterolemia, unspecified: Secondary | ICD-10-CM | POA: Diagnosis present

## 2017-05-29 DIAGNOSIS — Z8711 Personal history of peptic ulcer disease: Secondary | ICD-10-CM | POA: Diagnosis not present

## 2017-05-29 DIAGNOSIS — Z9049 Acquired absence of other specified parts of digestive tract: Secondary | ICD-10-CM | POA: Diagnosis not present

## 2017-05-29 DIAGNOSIS — E1043 Type 1 diabetes mellitus with diabetic autonomic (poly)neuropathy: Secondary | ICD-10-CM | POA: Diagnosis present

## 2017-05-29 DIAGNOSIS — R112 Nausea with vomiting, unspecified: Secondary | ICD-10-CM | POA: Diagnosis not present

## 2017-05-29 DIAGNOSIS — Z8719 Personal history of other diseases of the digestive system: Secondary | ICD-10-CM | POA: Diagnosis not present

## 2017-05-29 LAB — GLUCOSE, CAPILLARY
GLUCOSE-CAPILLARY: 499 mg/dL — AB (ref 65–99)
GLUCOSE-CAPILLARY: 417 mg/dL — AB (ref 65–99)
GLUCOSE-CAPILLARY: 331 mg/dL — AB (ref 65–99)
GLUCOSE-CAPILLARY: 146 mg/dL — AB (ref 65–99)
GLUCOSE-CAPILLARY: 112 mg/dL — AB (ref 65–99)
Glucose-Capillary: 114 mg/dL — ABNORMAL HIGH (ref 65–99)
Glucose-Capillary: 121 mg/dL — ABNORMAL HIGH (ref 65–99)
Glucose-Capillary: 182 mg/dL — ABNORMAL HIGH (ref 65–99)
Glucose-Capillary: 208 mg/dL — ABNORMAL HIGH (ref 65–99)
Glucose-Capillary: 249 mg/dL — ABNORMAL HIGH (ref 65–99)
Glucose-Capillary: 251 mg/dL — ABNORMAL HIGH (ref 65–99)
Glucose-Capillary: 253 mg/dL — ABNORMAL HIGH (ref 65–99)
Glucose-Capillary: 301 mg/dL — ABNORMAL HIGH (ref 65–99)
Glucose-Capillary: 552 mg/dL (ref 65–99)
Glucose-Capillary: 88 mg/dL (ref 65–99)
Glucose-Capillary: >600 mg/dL (ref 65–99)
Glucose-Capillary: >600 mg/dL (ref 65–99)

## 2017-05-29 LAB — BASIC METABOLIC PANEL
ANION GAP: 33 — AB (ref 5–15)
Anion gap: 19 — ABNORMAL HIGH (ref 5–15)
Anion gap: 20 — ABNORMAL HIGH (ref 5–15)
Anion gap: >20 — ABNORMAL HIGH (ref 5–15)
Anion gap: >20 — ABNORMAL HIGH (ref 5–15)
BUN: 67 mg/dL — AB (ref 6–20)
BUN: 70 mg/dL — ABNORMAL HIGH (ref 6–20)
BUN: 70 mg/dL — AB (ref 6–20)
BUN: 66 mg/dL — AB (ref 6–20)
BUN: 63 mg/dL — AB (ref 6–20)
BUN: 61 mg/dL — AB (ref 6–20)
BUN: 59 mg/dL — ABNORMAL HIGH (ref 6–20)
CALCIUM: 8.2 mg/dL — AB (ref 8.9–10.3)
CALCIUM: 8.8 mg/dL — AB (ref 8.9–10.3)
CALCIUM: 9.1 mg/dL (ref 8.9–10.3)
CALCIUM: 9.4 mg/dL (ref 8.9–10.3)
CHLORIDE: 78 mmol/L — AB (ref 101–111)
CO2: 13 mmol/L — ABNORMAL LOW (ref 22–32)
CO2: 15 mmol/L — ABNORMAL LOW (ref 22–32)
CO2: 23 mmol/L (ref 22–32)
CO2: 30 mmol/L (ref 22–32)
CO2: 28 mmol/L (ref 22–32)
CO2: 27 mmol/L (ref 22–32)
CO2: 25 mmol/L (ref 22–32)
CREATININE: 7.05 mg/dL — AB (ref 0.61–1.24)
CREATININE: 6.61 mg/dL — AB (ref 0.61–1.24)
CREATININE: 6.61 mg/dL — AB (ref 0.61–1.24)
CREATININE: 6.41 mg/dL — AB (ref 0.61–1.24)
Calcium: 9.5 mg/dL (ref 8.9–10.3)
Calcium: 9 mg/dL (ref 8.9–10.3)
Calcium: 9.3 mg/dL (ref 8.9–10.3)
Chloride: 85 mmol/L — ABNORMAL LOW (ref 101–111)
Chloride: 85 mmol/L — ABNORMAL LOW (ref 101–111)
Chloride: 89 mmol/L — ABNORMAL LOW (ref 101–111)
Chloride: 91 mmol/L — ABNORMAL LOW (ref 101–111)
Chloride: 91 mmol/L — ABNORMAL LOW (ref 101–111)
Chloride: 90 mmol/L — ABNORMAL LOW (ref 101–111)
Creatinine, Ser: 7.6 mg/dL — ABNORMAL HIGH (ref 0.61–1.24)
Creatinine, Ser: 7.55 mg/dL — ABNORMAL HIGH (ref 0.61–1.24)
Creatinine, Ser: 7.34 mg/dL — ABNORMAL HIGH (ref 0.61–1.24)
GFR calc Af Amer: 10 mL/min — ABNORMAL LOW (ref >60)
GFR calc Af Amer: 10 mL/min — ABNORMAL LOW (ref >60)
GFR calc Af Amer: 11 mL/min — ABNORMAL LOW (ref >60)
GFR calc Af Amer: 12 mL/min — ABNORMAL LOW (ref >60)
GFR calc Af Amer: 12 mL/min — ABNORMAL LOW (ref >60)
GFR calc non Af Amer: 8 mL/min — ABNORMAL LOW (ref >60)
GFR, EST AFRICAN AMERICAN: 10 mL/min — AB (ref >60)
GFR, EST AFRICAN AMERICAN: 12 mL/min — AB (ref >60)
GFR, EST NON AFRICAN AMERICAN: 8 mL/min — AB (ref >60)
GFR, EST NON AFRICAN AMERICAN: 9 mL/min — AB (ref >60)
GFR, EST NON AFRICAN AMERICAN: 9 mL/min — AB (ref >60)
GFR, EST NON AFRICAN AMERICAN: 10 mL/min — AB (ref >60)
GFR, EST NON AFRICAN AMERICAN: 10 mL/min — AB (ref >60)
GFR, EST NON AFRICAN AMERICAN: 10 mL/min — AB (ref >60)
GLUCOSE: 889 mg/dL — AB (ref 65–99)
GLUCOSE: 801 mg/dL — AB (ref 65–99)
GLUCOSE: 106 mg/dL — AB (ref 65–99)
Glucose, Bld: 555 mg/dL (ref 65–99)
Glucose, Bld: 281 mg/dL — ABNORMAL HIGH (ref 65–99)
Glucose, Bld: 127 mg/dL — ABNORMAL HIGH (ref 65–99)
Glucose, Bld: 215 mg/dL — ABNORMAL HIGH (ref 65–99)
POTASSIUM: 6.2 mmol/L — AB (ref 3.5–5.1)
POTASSIUM: 3.8 mmol/L (ref 3.5–5.1)
POTASSIUM: 3.5 mmol/L (ref 3.5–5.1)
POTASSIUM: 3.6 mmol/L (ref 3.5–5.1)
Potassium: 4.3 mmol/L (ref 3.5–5.1)
Potassium: 3.5 mmol/L (ref 3.5–5.1)
Potassium: 3.7 mmol/L (ref 3.5–5.1)
SODIUM: 138 mmol/L (ref 135–145)
SODIUM: 139 mmol/L (ref 135–145)
SODIUM: 140 mmol/L (ref 135–145)
SODIUM: 139 mmol/L (ref 135–145)
Sodium: 133 mmol/L — ABNORMAL LOW (ref 135–145)
Sodium: 133 mmol/L — ABNORMAL LOW (ref 135–145)
Sodium: 134 mmol/L — ABNORMAL LOW (ref 135–145)

## 2017-05-29 LAB — URINALYSIS, MICROSCOPIC (REFLEX)
Bacteria, UA: NONE SEEN
Squamous Epithelial / LPF: NONE SEEN (ref 0–5)
WBC UA: NONE SEEN WBC/hpf (ref 0–5)

## 2017-05-29 LAB — URINALYSIS, ROUTINE W REFLEX MICROSCOPIC
Bilirubin Urine: NEGATIVE
Ketones, ur: >80 mg/dL — AB
LEUKOCYTES UA: NEGATIVE
NITRITE: NEGATIVE
PH: 5.5 (ref 5.0–8.0)
Protein, ur: 100 mg/dL — AB
SPECIFIC GRAVITY, URINE: 1.025 (ref 1.005–1.030)

## 2017-05-29 LAB — CBG MONITORING, ED
Glucose-Capillary: >600 mg/dL (ref 65–99)
Glucose-Capillary: >600 mg/dL (ref 65–99)

## 2017-05-29 LAB — TYPE AND SCREEN
ABO/RH(D): A POS
ANTIBODY SCREEN: NEGATIVE

## 2017-05-29 LAB — MRSA PCR SCREENING: MRSA by PCR: NEGATIVE

## 2017-05-29 MED ORDER — AMLODIPINE BESYLATE 5 MG PO TABS
10.0000 mg | ORAL_TABLET | Freq: Every day | ORAL | Status: DC
  Filled 2017-05-29: qty 2

## 2017-05-29 MED ORDER — ONDANSETRON HCL 4 MG/2ML IJ SOLN
4.0000 mg | Freq: Once | INTRAMUSCULAR | Status: AC
  Administered 2017-05-29: 4 mg via INTRAVENOUS

## 2017-05-29 MED ORDER — ONDANSETRON HCL 4 MG/2ML IJ SOLN
4.0000 mg | Freq: Four times a day (QID) | INTRAMUSCULAR | Status: DC | PRN
  Administered 2017-05-29 – 2017-05-31 (×5): 4 mg via INTRAVENOUS
  Filled 2017-05-29 (×5): qty 2

## 2017-05-29 MED ORDER — HYDROXYZINE PAMOATE 25 MG PO CAPS
25.0000 mg | ORAL_CAPSULE | Freq: Three times a day (TID) | ORAL | Status: DC | PRN
  Administered 2017-05-31: 25 mg via ORAL
  Filled 2017-05-29 (×2): qty 1

## 2017-05-29 MED ORDER — SODIUM CHLORIDE 0.9 % IV SOLN
INTRAVENOUS | Status: DC
  Administered 2017-05-29 (×2): via INTRAVENOUS

## 2017-05-29 MED ORDER — SODIUM CHLORIDE 0.9 % IV BOLUS
2000.0000 mL | Freq: Once | INTRAVENOUS | Status: AC
  Administered 2017-05-29: 2000 mL via INTRAVENOUS

## 2017-05-29 MED ORDER — DEXTROSE-NACL 5-0.45 % IV SOLN: INTRAVENOUS | Status: DC

## 2017-05-29 MED ORDER — CLONIDINE HCL 0.2 MG/24HR TD PTWK: 0.2000 mg | MEDICATED_PATCH | TRANSDERMAL | Status: DC

## 2017-05-29 MED ORDER — INSULIN ASPART 100 UNIT/ML IV SOLN
10.0000 [IU] | Freq: Once | INTRAVENOUS | Status: AC
  Administered 2017-05-29: 10 [IU] via INTRAVENOUS

## 2017-05-29 MED ORDER — DEXTROSE-NACL 5-0.45 % IV SOLN
INTRAVENOUS | Status: DC
  Administered 2017-05-29: 1000 mL via INTRAVENOUS
  Administered 2017-05-30: 03:00:00 via INTRAVENOUS

## 2017-05-29 MED ORDER — CALCITRIOL 0.25 MCG PO CAPS
0.2500 ug | ORAL_CAPSULE | Freq: Every day | ORAL | Status: DC
  Administered 2017-05-30 – 2017-06-02 (×4): 0.25 ug via ORAL
  Filled 2017-05-29 (×4): qty 1

## 2017-05-29 MED ORDER — SODIUM CHLORIDE 0.9 % IV SOLN
INTRAVENOUS | Status: DC
  Administered 2017-05-29: 32.5 [IU]/h via INTRAVENOUS
  Administered 2017-05-29: 21.6 [IU]/h via INTRAVENOUS
  Administered 2017-05-30: 2.8 [IU]/h via INTRAVENOUS
  Filled 2017-05-29 (×3): qty 1

## 2017-05-29 MED ORDER — SODIUM BICARBONATE 8.4 % IV SOLN
50.0000 meq | Freq: Once | INTRAVENOUS | Status: AC
  Administered 2017-05-29: 50 meq via INTRAVENOUS
  Filled 2017-05-29: qty 50

## 2017-05-29 MED ORDER — SODIUM CHLORIDE 0.9 % IV SOLN: INTRAVENOUS | Status: AC

## 2017-05-29 MED ORDER — SODIUM CHLORIDE 0.9 % IV SOLN
INTRAVENOUS | Status: DC
  Administered 2017-05-29: 4.4 [IU]/h via INTRAVENOUS
  Filled 2017-05-29 (×2): qty 1

## 2017-05-29 MED ORDER — HEPARIN SODIUM (PORCINE) 5000 UNIT/ML IJ SOLN
5000.0000 [IU] | Freq: Three times a day (TID) | INTRAMUSCULAR | Status: DC
  Administered 2017-05-29 – 2017-06-02 (×9): 5000 [IU] via SUBCUTANEOUS
  Filled 2017-05-29 (×10): qty 1

## 2017-05-29 MED ORDER — SODIUM CHLORIDE 0.9 % IV SOLN
1.0000 g | Freq: Once | INTRAVENOUS | Status: AC
  Administered 2017-05-29: 1 g via INTRAVENOUS
  Filled 2017-05-29: qty 10

## 2017-05-29 MED ORDER — ONDANSETRON HCL 4 MG/2ML IJ SOLN
INTRAMUSCULAR | Status: AC
  Administered 2017-05-29: 4 mg via INTRAVENOUS
  Filled 2017-05-29: qty 2

## 2017-05-29 MED ORDER — ONDANSETRON HCL 4 MG/2ML IJ SOLN
4.0000 mg | Freq: Once | INTRAMUSCULAR | Status: AC
  Administered 2017-05-29: 4 mg via INTRAVENOUS
  Filled 2017-05-29: qty 2

## 2017-05-29 MED ORDER — LORAZEPAM 2 MG/ML IJ SOLN
1.0000 mg | Freq: Once | INTRAMUSCULAR | Status: AC
  Administered 2017-05-29: 1 mg via INTRAVENOUS
  Filled 2017-05-29: qty 1

## 2017-05-29 MED ORDER — PANTOPRAZOLE SODIUM 40 MG PO TBEC
40.0000 mg | DELAYED_RELEASE_TABLET | Freq: Every day | ORAL | Status: DC
  Administered 2017-05-30 – 2017-06-02 (×4): 40 mg via ORAL
  Filled 2017-05-29 (×4): qty 1

## 2017-05-29 MED ORDER — GABAPENTIN 300 MG PO CAPS
300.0000 mg | ORAL_CAPSULE | Freq: Every day | ORAL | Status: DC
  Administered 2017-05-30 – 2017-06-01 (×3): 300 mg via ORAL
  Filled 2017-05-29 (×4): qty 1

## 2017-05-29 MED ORDER — SODIUM POLYSTYRENE SULFONATE 15 GM/60ML PO SUSP
30.0000 g | Freq: Once | ORAL | Status: AC
  Administered 2017-05-29: 30 g via ORAL
  Filled 2017-05-29: qty 120

## 2017-05-29 MED ORDER — CARVEDILOL 12.5 MG PO TABS
12.5000 mg | ORAL_TABLET | Freq: Two times a day (BID) | ORAL | Status: DC
  Administered 2017-05-29: 12.5 mg via ORAL
  Filled 2017-05-29 (×2): qty 1

## 2017-05-29 NOTE — Progress Notes (Signed)
Pt vomiting continuously and dry heaving. Pt diet changed back to NPO. Pt very upset about this. Being very rude and disrespectful to staff. CBG still elevated and reading high on CBG meter.

## 2017-05-29 NOTE — Progress Notes (Signed)
Inpatient Diabetes Program Recommendations  AACE/ADA: New Consensus Statement on Inpatient Glycemic Control (2015)  Target Ranges:  Prepandial:   less than 140 mg/dL      Peak postprandial:   less than 180 mg/dL (1-2 hours)      Critically ill patients:  140 - 180 mg/dL   Lab Results  Component Value Date   GLUCAP >600 ([place]) 05/29/2017   HGBA1C 7.6 (H) 11/16/2016    Review of Glycemic Control Results for Brent Daniels, Brent Daniels (MRN [id number]) as of 05/29/2017 07:44  Ref. Range 05/28/2017 22:20 05/29/2017 02:20 05/29/2017 03:35 05/29/2017 04:45 05/29/2017 05:41 05/29/2017 06:35  Glucose-Capillary Latest Ref Range: 65 - 99 mg/dL >600 (HH) >600 (HH) >600 (HH) >600 (HH) >600 (HH) >600 (HH)   Diabetes history: DM1 Outpatient Diabetes medications: Lantus 15 units + Novolin R sliding scale Current orders for Inpatient glycemic control: IV insulin drip  Inpatient Diabetes Program Recommendations:   Spoke with RN Loni Muse by phone (DM coordinator @ [facility]). -Requested to recheck CBG and restart glucostabilizer @ 0.01 mulitplier due to past 6 CBGs >601 and check IV. IV insulin has been infusing over the past 6 hrs with minimal decrease in CBGs. -Glucostabilizer restarted and new IV insulin bag was hung 0647 am. Will follow CBGs and IV insulin drip rates.  Patient's last visit with endocrinologist Dr. Dorris Fetch was  01/01/17. Patient has been dx'd  As type 1 diabetes since age 32.  Thank you, Nani Gasser. Eren Puebla, RN, MSN, CDE  Diabetes Coordinator Inpatient Glycemic Control Team Team Pager [phone] (8am-5pm) 05/29/2017 8:07 AM

## 2017-05-29 NOTE — Progress Notes (Signed)
Critical glucose called by lab. On insulin drip, glucose stabilizer and followed by Diabetes coordinator.

## 2017-05-29 NOTE — ED Notes (Signed)
Date and time results received: 05/29/17 12:03 AM  (use smartphrase ".now" to insert current time)  Test: Blood Glucose  Critical Value: 889 mg/dL  Name of Provider Notified: [facility]  Orders Received? Or Actions Taken?: Orders to be put in.

## 2017-05-29 NOTE — ED Notes (Signed)
[facility] called out

## 2017-05-29 NOTE — Progress Notes (Signed)
CRITICAL VALUE ALERT  Critical Value:  CBG 801 Date & Time Notied:  05/29/17 1025

## 2017-05-29 NOTE — Progress Notes (Signed)
Patient seen and examined. Admitted after midnight due to abd pain, nausea and vomiting. Found to be in DKA (CBG's >800, anion gap 33 and bicarb 15). Patient with stable VS and complaining of dry hiving. No fever, no CP, no SOB. He is well known to be non-compliant with medications. Please refer to H&P written by Dr. Maudie Mercury for further info/details on admission.  Plan: -continue IVF's -PRN antiemetics -insulin drip -electrolytes repletion as needed  -follow DKA protocol  -ok with sips of water.  Barton Dubois MD [phone]

## 2017-05-29 NOTE — H&P (Addendum)
TRH H&P   Patient Demographics:    Brent Daniels, is a 32 y.o. male  MRN: [id number]   DOB - [birth year]  Admit Date - 05/28/2017  Outpatient Primary MD for the patient is Corrington, Kip A, MD  Referring MD/NP/PA: [place]  Outpatient Specialists:     Patient coming from: home  Chief Complaint  Patient presents with  . IVC  . Hyperglycemia      HPI:    Brent Daniels  is a 32 y.o. male, w hypertension,  esophagitis, dm1 apparently presents with n/v, abdominal discomfort.  Pt has been noncompliant with his insulin and sent to ER by family.   In ED,  Na 133, K 6.2, Glucose 889 Bun 67, Creatinine 7.60  Wbc 24.4, Hgb 9.5, Plt 480  PH 7.245, pCo2 32.2,   Pt will be admitted for DKA as well as Acute on CRF   Review of systems:    In addition to the HPI above, No Fever-chills, No Headache, No changes with Vision or hearing, No problems swallowing food or Liquids, No Chest pain, Cough or Shortness of Breath,  No Blood in stool or Urine, No dysuria, No new skin rashes or bruises, No new joints pains-aches,  No new weakness, tingling, numbness in any extremity, No recent weight gain or loss, No polyuria, polydypsia or polyphagia, No significant Mental Stressors.  A full 10 point Review of Systems was done, except as stated above, all other Review of Systems were negative.   With Past History of the following :    Past Medical History:  Diagnosis Date  . Acute esophagitis   . Anemia   . Anxiety   . Arthritis   . Bicuspid aortic valve    noted on 10/20/16 [facility])  . Chronic abdominal pain   . Chronic back pain   . Chronic kidney disease    Stage 5 Kidney disease  . Depression   . Diabetes mellitus    type 1  . Diabetic gastroparesis associated with type 1 diabetes mellitus ([place])   . Erosive esophagitis 12/23/2013   "severe" per EGD   .  Gastroparesis   . GERD (gastroesophageal reflux disease)   . Headache    in the past  . Heart murmur    mild TR, mild MR, mild AS 02/19/17  . History of hiatal hernia   . Hypercholesteremia   . Hypertension   . Insomnia   . Nausea and vomiting    chronic, recurrent  . Neuropathy   . Pneumonia   . PUD (peptic ulcer disease)   . S/P arthroscopic knee surgery 07/04/2011  . Scoliosis 07/11/2012      Past Surgical History:  Procedure Laterality Date  . AV FISTULA PLACEMENT Right 04/09/2017   Procedure: ARTERIOVENOUS (AV) FISTULA CREATION RIGHT ARM;  Surgeon: Rosetta Posner, MD;  Location: [facility];  Service: Vascular;  Laterality:  Right;  Marland Kitchen BIOPSY  06/17/2013   Procedure: GASTRIC ULCER AND ANTRAL BIOPSIES; ESOPHAGEAL BRUSHING;  Surgeon: Danie Binder, MD;  Location: [facility];  Service: Endoscopy;;  . BIOPSY N/A 12/23/2013   Procedure: ESOPHAGEAL BIOPSY;  Surgeon: Daneil Dolin, MD;  Location: [facility];  Service: Endoscopy;  Laterality: N/A;  . CHOLECYSTECTOMY N/A 01/28/2014   Procedure: LAPAROSCOPIC CHOLECYSTECTOMY;  Surgeon: Jamesetta So, MD;  Location: [facility];  Service: General;  Laterality: N/A;  . ESOPHAGOGASTRODUODENOSCOPY (EGD) WITH PROPOFOL  06/17/2013   Dr. Oneida Alar: two gastric ulcers in fundus, mild antral gastritis, candida esophagitis  . ESOPHAGOGASTRODUODENOSCOPY (EGD) WITH PROPOFOL N/A 09/11/2013   Dr. [name]: abnormal hypopharynx, question massively enlarged tonsils, stomach full of food precluded exam, needs EGD for verification of ulcer healing at a later date  . ESOPHAGOGASTRODUODENOSCOPY (EGD) WITH PROPOFOL N/A 12/23/2013   RMR: Severe exudative esophagitis likely predominatly reflux related. Superimposed Candida infection not excluded status post KOH brushing and biopsy. Localized excoriating gastric mucosa most consistant with trauma(vomiting). No evidence of peptic ulcer disease or other gastric/duodenal pathology. I suspect severe inflammation involving the distal esophagus may  account  for at least some of patii  . KNEE ARTHROSCOPY  06/30/2011   Procedure: ARTHROSCOPY KNEE;  Surgeon: Carole Civil, MD;  Location: [facility];  Service: Orthopedics;  Laterality: Right;  diagnostic arthroscopy  . TYMPANOSTOMY TUBE PLACEMENT        Social History:     Social History   Tobacco Use  . Smoking status: Current Some Day Smoker    Packs/day: 0.25    Years: 10.00    Pack years: 2.50    Types: Cigarettes  . Smokeless tobacco: Former Systems developer    Quit date: 02/17/2013  . Tobacco comment: smokes 4 cigarettes a day   Substance Use Topics  . Alcohol use: No     Lives - lives at home  Mobility - walks by self   Family History :     Family History  Problem Relation Age of Onset  . Cancer Father   . Alcohol abuse Father   . Pseudochol deficiency Neg Hx   . Malignant hyperthermia Neg Hx   . Hypotension Neg Hx   . Anesthesia problems Neg Hx   . Colon cancer Neg Hx        Home Medications:   Prior to Admission medications   Medication Sig Start Date End Date Taking? Authorizing Provider  aspirin EC 81 MG tablet Take 81 mg by mouth daily.   Yes [provider]  carvedilol (COREG) 12.5 MG tablet Take 12.5 mg by mouth 2 (two) times daily with a meal.   Yes [provider]  amLODipine (NORVASC) 10 MG tablet Take 10 mg by mouth daily.  05/08/15   [provider]  calcitRIOL (ROCALTROL) 0.25 MCG capsule Take 1 capsule (0.25 mcg total) by mouth daily. Patient not taking: Reported on 04/04/2017 01/01/17   Cassandria Anger, MD  cloNIDine (CATAPRES - DOSED IN MG/24 HR) 0.2 mg/24hr patch Place 0.2 mg onto the skin every Monday. 12/29/16   [provider]  Continuous Blood Gluc Receiver (FREESTYLE LIBRE 14 DAY READER) [place] 1 each by Does not apply route every 14 (fourteen) days. 04/05/17   Cassandria Anger, MD  Continuous Blood Gluc Sensor (FREESTYLE LIBRE 14 DAY SENSOR) MISC 1 each by Does not apply route every 14 (fourteen) days.  04/05/17   Cassandria Anger, MD  Continuous Blood Gluc Sensor ([place]  SYSTEM) MISC Use one sensor every 10 days. 01/01/17   Cassandria Anger, MD  gabapentin (NEURONTIN) 300 MG capsule Take 1 capsule (300 mg total) by mouth at bedtime. Patient taking differently: Take 300 mg by mouth 3 (three) times daily.  11/19/16   Johnson, Clanford L, MD  hydrOXYzine (VISTARIL) 25 MG capsule Take 25 mg by mouth 3 (three) times daily as needed. For itching/anxiety. 02/08/17   [provider]  Insulin Glargine (LANTUS) 100 UNIT/ML Solostar Pen Inject 15 Units into the skin at bedtime.     [provider]  insulin regular (NOVOLIN R,HUMULIN R) 100 units/mL injection Inject 0-16 Units into the skin 3 (three) times daily before meals. Sliding Scale Insulin  Is BLOOD SUGAR READING - 100/30 (BLOOD SUGAR MINUS ONE HUNDRED DIVIDED BY THIRTY)    [provider]  ondansetron (ZOFRAN-ODT) 8 MG disintegrating tablet Take 8 mg by mouth every 6 (six) hours as needed for nausea/vomiting. 02/08/17   [provider]  oxyCODONE (ROXICODONE) 5 MG immediate release tablet Take 1 tablet (5 mg total) by mouth every 6 (six) hours as needed. 04/09/17 04/09/18  Dagoberto Ligas, PA-C  pantoprazole (PROTONIX) 40 MG tablet Take 1 tablet (40 mg total) by mouth 2 (two) times daily before a meal. Patient taking differently: Take 40 mg by mouth daily.  11/19/16 04/04/18  Johnson, Clanford L, MD  potassium chloride SA (K-DUR,KLOR-CON) 20 MEQ tablet Take 1 tablet (20 mEq total) by mouth daily. 11/19/16 11/26/16  Johnson, Clanford L, MD  tiZANidine (ZANAFLEX) 4 MG tablet Take 4 mg by mouth 2 (two) times daily.    [provider]     Allergies:     Allergies  Allergen Reactions  . Hydrocodone Hives  . Hydrocodone-Acetaminophen Anaphylaxis  . Tramadol Anaphylaxis and Other (See Comments)    Acid Reflux  . Ibuprofen Other (See Comments) and Nausea And Vomiting    Stomach ulcers  .  Morphine And Related Rash and Hives  . Naproxen Other (See Comments)    Stomach ulcers  . Sulfa Antibiotics Rash and Other (See Comments)    Stomach ulcers   . Acetaminophen Nausea And Vomiting  . Ketorolac Other (See Comments)    Unknown  . Nsaids Nausea And Vomiting     Physical Exam:   Vitals  Blood pressure (!) 157/64, pulse (!) 119, temperature 97.6 F (36.4 C), temperature source Axillary, resp. rate (!) 22, SpO2 100 %.   1. General  lying in bed in NAD,    2. Normal affect and insight, Not Suicidal or Homicidal, Awake Alert, Oriented X 3.  3. No F.N deficits, ALL C.Nerves Intact, Strength 5/5 all 4 extremities, Sensation intact all 4 extremities, Plantars down going.  4. Ears and Eyes appear Normal, Conjunctivae clear, PERRLA. Moist Oral Mucosa.  5. Supple Neck, No JVD, No cervical lymphadenopathy appriciated, No Carotid Bruits.  6. Symmetrical Chest wall movement, Good air movement bilaterally, CTAB.  7. Tachy s1, s2,   8. Positive Bowel Sounds, Abdomen Soft, No tenderness, No organomegaly appriciated,No rebound -guarding or rigidity.  9.  No Cyanosis, Normal Skin Turgor, No Skin Rash or Bruise.  10. Good muscle tone,  joints appear normal , no effusions, Normal ROM.  11. No Palpable Lymph Nodes in Neck or Axillae      Data Review:    CBC Recent Labs  Lab 05/28/17 2215  WBC 24.4*  HGB 9.5*  HCT 31.9*  PLT 480*  MCV 91.9  MCH 27.4  MCHC  29.8*  RDW 15.9*   ------------------------------------------------------------------------------------------------------------------  Chemistries  Recent Labs  Lab 05/28/17 2215  NA 133*  K 6.2*  CL 78*  CO2 13*  GLUCOSE 889*  BUN 67*  CREATININE 7.60*  CALCIUM 9.5   ------------------------------------------------------------------------------------------------------------------ CrCl cannot be calculated (Unknown ideal  weight.). ------------------------------------------------------------------------------------------------------------------ No results for input(s): TSH, T4TOTAL, T3FREE, THYROIDAB in the last 72 hours.  Invalid input(s): FREET3  Coagulation profile No results for input(s): INR, PROTIME in the last 168 hours. ------------------------------------------------------------------------------------------------------------------- No results for input(s): DDIMER in the last 72 hours. -------------------------------------------------------------------------------------------------------------------  Cardiac Enzymes No results for input(s): CKMB, TROPONINI, MYOGLOBIN in the last 168 hours.  Invalid input(s): CK ------------------------------------------------------------------------------------------------------------------ No results found for: BNP   ---------------------------------------------------------------------------------------------------------------  Urinalysis    Component Value Date/Time   COLORURINE YELLOW 11/16/2016 [place] 11/16/2016 1221   LABSPEC 1.016 11/16/2016 1221   PHURINE 5.0 11/16/2016 1221   GLUCOSEU >=500 (A) 11/16/2016 1221   HGBUR MODERATE (A) 11/16/2016 1221   BILIRUBINUR NEGATIVE 11/16/2016 1221   KETONESUR 80 (A) 11/16/2016 1221   PROTEINUR >=300 (A) 11/16/2016 1221   UROBILINOGEN 1.0 11/13/2014 0155   NITRITE NEGATIVE 11/16/2016 1221   LEUKOCYTESUR NEGATIVE 11/16/2016 1221    ----------------------------------------------------------------------------------------------------------------   Imaging Results:    No results found.   EKG st at 115, nl axis, no st-t changes c/w ischemia    Assessment & Plan:    Principal Problem:   DKA (diabetic ketoacidoses) (HCC) Active Problems:   Abdominal pain    DKA NPO => clear liquid NS iv Insulin iv Bmp q4h   N/v Zofran Protonix  Abdominal pain CT scan abd/ pelvis  pending  ARF on CRF Ns iv Check cmp in am  Hyperkalemia Calcium gluconate  Sodium bicarbonate 1 amp iv Kayexalate 30gm po x1 Check cmp in am    DVT Prophylaxis Heparin - SCDs   AM Labs Ordered, also please review Full Orders  Family Communication: Admission, patients condition and plan of care including tests being ordered have been discussed with the patientwho indicate understanding and agree with the plan and Code Status.  Code Status FULL CODE  Likely DC to  home  Condition GUARDED    Consults called: none  Admission status: inpatient   Time spent in minutes : 45 critical care  Jani Gravel M.D on 05/29/2017 at 2:04 AM  Between 7am to 7pm - Pager - [phone]  . After 7pm go to [email or web address] - password [facility]  [facility] - Office  [phone]

## 2017-05-30 ENCOUNTER — Inpatient Hospital Stay (HOSPITAL_COMMUNITY): Payer: Managed Care, Other (non HMO)

## 2017-05-30 DIAGNOSIS — N178 Other acute kidney failure: Secondary | ICD-10-CM

## 2017-05-30 DIAGNOSIS — R112 Nausea with vomiting, unspecified: Secondary | ICD-10-CM

## 2017-05-30 DIAGNOSIS — N189 Chronic kidney disease, unspecified: Secondary | ICD-10-CM

## 2017-05-30 DIAGNOSIS — N179 Acute kidney failure, unspecified: Secondary | ICD-10-CM

## 2017-05-30 DIAGNOSIS — E1065 Type 1 diabetes mellitus with hyperglycemia: Secondary | ICD-10-CM

## 2017-05-30 LAB — GLUCOSE, CAPILLARY
GLUCOSE-CAPILLARY: 145 mg/dL — AB (ref 65–99)
GLUCOSE-CAPILLARY: 148 mg/dL — AB (ref 65–99)
GLUCOSE-CAPILLARY: 170 mg/dL — AB (ref 65–99)
GLUCOSE-CAPILLARY: 186 mg/dL — AB (ref 65–99)
GLUCOSE-CAPILLARY: 205 mg/dL — AB (ref 65–99)
Glucose-Capillary: 113 mg/dL — ABNORMAL HIGH (ref 65–99)
Glucose-Capillary: 130 mg/dL — ABNORMAL HIGH (ref 65–99)
Glucose-Capillary: 138 mg/dL — ABNORMAL HIGH (ref 65–99)
Glucose-Capillary: 142 mg/dL — ABNORMAL HIGH (ref 65–99)
Glucose-Capillary: 156 mg/dL — ABNORMAL HIGH (ref 65–99)
Glucose-Capillary: 159 mg/dL — ABNORMAL HIGH (ref 65–99)
Glucose-Capillary: 185 mg/dL — ABNORMAL HIGH (ref 65–99)
Glucose-Capillary: 202 mg/dL — ABNORMAL HIGH (ref 65–99)
Glucose-Capillary: 207 mg/dL — ABNORMAL HIGH (ref 65–99)
Glucose-Capillary: 284 mg/dL — ABNORMAL HIGH (ref 65–99)
Glucose-Capillary: 329 mg/dL — ABNORMAL HIGH (ref 65–99)

## 2017-05-30 LAB — BASIC METABOLIC PANEL
Anion gap: >20 — ABNORMAL HIGH (ref 5–15)
BUN: 55 mg/dL — AB (ref 6–20)
BUN: 54 mg/dL — AB (ref 6–20)
CALCIUM: 9.9 mg/dL (ref 8.9–10.3)
CALCIUM: 9.7 mg/dL (ref 8.9–10.3)
CO2: 26 mmol/L (ref 22–32)
CO2: 29 mmol/L (ref 22–32)
CREATININE: 6.34 mg/dL — AB (ref 0.61–1.24)
CREATININE: 6.29 mg/dL — AB (ref 0.61–1.24)
Chloride: 91 mmol/L — ABNORMAL LOW (ref 101–111)
Chloride: 90 mmol/L — ABNORMAL LOW (ref 101–111)
GFR calc non Af Amer: 10 mL/min — ABNORMAL LOW (ref >60)
GFR, EST AFRICAN AMERICAN: 12 mL/min — AB (ref >60)
GFR, EST AFRICAN AMERICAN: 12 mL/min — AB (ref >60)
GFR, EST NON AFRICAN AMERICAN: 11 mL/min — AB (ref >60)
Glucose, Bld: 143 mg/dL — ABNORMAL HIGH (ref 65–99)
Glucose, Bld: 135 mg/dL — ABNORMAL HIGH (ref 65–99)
Potassium: 3.5 mmol/L (ref 3.5–5.1)
Potassium: 3.2 mmol/L — ABNORMAL LOW (ref 3.5–5.1)
Sodium: 140 mmol/L (ref 135–145)
Sodium: 140 mmol/L (ref 135–145)

## 2017-05-30 LAB — RAPID URINE DRUG SCREEN, HOSP PERFORMED
Amphetamines: NOT DETECTED
BARBITURATES: NOT DETECTED
Benzodiazepines: NOT DETECTED
COCAINE: NOT DETECTED
Opiates: NOT DETECTED
TETRAHYDROCANNABINOL: NOT DETECTED

## 2017-05-30 LAB — HEPATIC FUNCTION PANEL
ALBUMIN: 4.2 g/dL (ref 3.5–5.0)
ALK PHOS: 129 U/L — AB (ref 38–126)
ALT: 15 U/L — ABNORMAL LOW (ref 17–63)
AST: 26 U/L (ref 15–41)
BILIRUBIN TOTAL: 1.4 mg/dL — AB (ref 0.3–1.2)
Bilirubin, Direct: 0.1 mg/dL (ref 0.1–0.5)
Indirect Bilirubin: 1.3 mg/dL — ABNORMAL HIGH (ref 0.3–0.9)
Total Protein: 7.9 g/dL (ref 6.5–8.1)

## 2017-05-30 LAB — LIPASE, BLOOD: Lipase: 19 U/L (ref 11–51)

## 2017-05-30 MED ORDER — POTASSIUM CHLORIDE IN NACL 40-0.9 MEQ/L-% IV SOLN
INTRAVENOUS | Status: DC
  Administered 2017-05-30 – 2017-06-01 (×3): 125 mL/h via INTRAVENOUS

## 2017-05-30 MED ORDER — METOCLOPRAMIDE HCL 5 MG/ML IJ SOLN
5.0000 mg | Freq: Four times a day (QID) | INTRAMUSCULAR | Status: DC
  Administered 2017-05-30 – 2017-05-31 (×3): 5 mg via INTRAVENOUS
  Filled 2017-05-30 (×4): qty 2

## 2017-05-30 MED ORDER — FAMOTIDINE IN NACL 20-0.9 MG/50ML-% IV SOLN
20.0000 mg | INTRAVENOUS | Status: DC
  Administered 2017-05-30 – 2017-06-01 (×3): 20 mg via INTRAVENOUS
  Filled 2017-05-30 (×4): qty 50

## 2017-05-30 MED ORDER — INSULIN ASPART 100 UNIT/ML ~~LOC~~ SOLN: 0.0000 [IU] | SUBCUTANEOUS | Status: DC

## 2017-05-30 MED ORDER — FAMOTIDINE IN NACL 20-0.9 MG/50ML-% IV SOLN: 20.0000 mg | Freq: Two times a day (BID) | INTRAVENOUS | Status: DC

## 2017-05-30 MED ORDER — INSULIN ASPART 100 UNIT/ML ~~LOC~~ SOLN
0.0000 [IU] | SUBCUTANEOUS | Status: DC
  Administered 2017-05-30: 11 [IU] via SUBCUTANEOUS
  Administered 2017-05-30: 3 [IU] via SUBCUTANEOUS
  Administered 2017-05-30: 5 [IU] via SUBCUTANEOUS
  Administered 2017-05-31: 3 [IU] via SUBCUTANEOUS

## 2017-05-30 MED ORDER — METOPROLOL TARTRATE 5 MG/5ML IV SOLN
5.0000 mg | Freq: Four times a day (QID) | INTRAVENOUS | Status: DC
  Administered 2017-05-30 – 2017-05-31 (×6): 5 mg via INTRAVENOUS
  Filled 2017-05-30 (×7): qty 5

## 2017-05-30 MED ORDER — ONDANSETRON HCL 4 MG/2ML IJ SOLN
4.0000 mg | Freq: Four times a day (QID) | INTRAMUSCULAR | Status: DC
  Administered 2017-05-30 – 2017-05-31 (×5): 4 mg via INTRAVENOUS
  Filled 2017-05-30 (×6): qty 2

## 2017-05-30 MED ORDER — INSULIN GLARGINE 100 UNIT/ML ~~LOC~~ SOLN
10.0000 [IU] | Freq: Every day | SUBCUTANEOUS | Status: DC
Start: 2017-05-30 — End: 2017-05-31
  Administered 2017-05-30: 10 [IU] via SUBCUTANEOUS
  Filled 2017-05-30 (×2): qty 0.1

## 2017-05-30 NOTE — Progress Notes (Signed)
Pt refusing BP and refusing to wear BP cuff. Pt has been very agitated and because he is NPO and has been requesting water repeatedly throughout the night. Pt even walked to the sink in the room to drink from the faucet multiple times. Pt told that he could not have anything to eat or drink several times, but was still getting up. Security was called to help get patient back into bed. Pt has been vomiting off and on all night. Explained to pt that water makes him vomit more. Pt was given IV zofran to help with the nausea. Pt in bed with sitter at bedside at this time.

## 2017-05-30 NOTE — Progress Notes (Signed)
Inpatient Diabetes Program Recommendations  AACE/ADA: New Consensus Statement on Inpatient Glycemic Control (2015)  Target Ranges:  Prepandial:   less than 140 mg/dL      Peak postprandial:   less than 180 mg/dL (1-2 hours)      Critically ill patients:  140 - 180 mg/dL   Lab Results  Component Value Date   GLUCAP 138 (H) 05/30/2017   HGBA1C 7.6 (H) 11/16/2016    Review of Glycemic Control  Diabetes history: DM1 Outpatient Diabetes medications: Lantus 15 units + Novolin R sliding scale Current orders for Inpatient glycemic control: IV insulin drip  Inpatient Diabetes Program Recommendations: Insulin - Basal: When MD is ready to transition from IV to SQ insulin, please consider ordering Lantus 15 units Q24H. Correction (SSI): When MD is ready to transition from IV to SQ insulin, please consider ordering CBGs with Novolog 0-9 units Q4H. Insulin - Meal Coverage: When MD is ready to transition from IV to SQ insulin and diet is ordered, please consider ordering Novolog 3 units TID with meals for meal coverage if patient eats at least 50% of meals.  Thank you, Nani Gasser. Christyn Gutkowski, RN, MSN, CDE  Diabetes Coordinator Inpatient Glycemic Control Team Team Pager [phone] (8am-5pm) 05/30/2017 8:13 AM

## 2017-05-30 NOTE — Progress Notes (Signed)
PROGRESS NOTE  Brent Daniels AST:[id number] DOB: [birth year] DOA: 05/28/2017 PCP: [name], MD  Brief History:  32 year old male with a history of type 1 diabetes mellitus, diabetic gastroparesis, CKD stage V, tobacco abuse, chronic abdominal pain, erosive esophagitis, hypertension, GERD presenting as an involuntary commitment by his mother with whom he lives.  Apparently, the patient has not been taking his insulin, and has had 3 days of nausea, vomiting, and abdominal pain.  CBG at home was reading high.  The patient has been denying suicidal and homicidal ideations.  Patient presented with a serum glucose of 889, bicarbonate 13, and anion gap >20.  He was started on IV fluids, IV insulin, and his home antihypertensive medications.  Unfortunately, the patient continues to have intractable vomiting despite resolution of his DKA.  Assessment/Plan: DKA, type I -patient started on IV insulin with q 1 hour CBG check and q 4 hour BMPs -pt started on aggressive fluid resuscitation -Electrolytes were monitored and repleted -transitioned to [place] insulin as bicarb stable at 29-30 -Patient continues to have elevated anion gap secondary to acute on chronic renal failure and impaired bicarbonate reabsorption -02/20/2017 HbA1C--8.5  Intractable vomiting -Likely secondary to gastroparesis -Start around-the-clock Zofran -Start IV Reglan -Two-view abdominal x-ray -Patient with a history of erosive esophagitis--> start PPI -GI consult if no improvement -Urinalysis negative for pyuria -check lipase -check LFTs  Uncontrolled hypertension -Start IV Lopressor in the setting of intractable vomiting -Continue transdermal clonidine home dose -Holding carvedilol and amlodipine secondary to intractable vomiting  Acute on chronic renal failure--CKD stage V -Baseline creatinine 4.1-4.5 -Serum creatinine peaked at 7.60 -Continue IV fluids -Patient appears clinically volume depleted -AV  fistula placed 04/09/2017  Tobacco abuse -Tobacco cessation discussed  Chronic abdominal pain -Likely secondary to his gastroparesis  Involuntary commitment -Patient denies suicidal and homicidal ideation -He has capacity to make medical decisions; although the patient has made poor choices regarding his health, he expressed understanding of the severity of his current medical condition  Anemia of CKD -Check iron studies -Baseline hemoglobin 9-10     Disposition Plan:   Home in 2-3 days  Family Communication:   No Family at bedside  Consultants:  none  Code Status:  FULL  DVT Prophylaxis:   Heparin    Procedures: As Listed in Progress Note Above  Antibiotics: None    Subjective: Patient continues to have vomiting  Objective: Vitals:   05/30/17 0400 05/30/17 0500 05/30/17 0600 05/30/17 0800  BP:      Pulse: 95 96 95   Resp:  14 18   Temp: 98 F (36.7 C)   98.3 F (36.8 C)  TempSrc:    Axillary  SpO2: 98% 100% 98%   Weight:      Height:        Intake/Output Summary (Last 24 hours) at 05/30/2017 0948 Last data filed at 05/30/2017 0400 Gross per 24 hour  Intake 1927.4 ml  Output 2450 ml  Net -522.6 ml   Weight change:  Exam:   General:  Pt is alert, follows commands appropriately, not in acute distress  HEENT: No icterus, No thrush, No neck mass, [place]/AT  Cardiovascular: RRR, S1/S2, no rubs, no gallops  Respiratory: CTA bilaterally, no wheezing, no crackles, no rhonchi  Abdomen: Soft/+BS, diffusely tender, non distended, no guarding  Extremities: No edema, No lymphangitis, No petechiae, No rashes, no synovitis   Data Reviewed: I have personally reviewed following labs and imaging  studies Basic Metabolic Panel: Recent Labs  Lab 05/29/17 1606 05/29/17 1837 05/29/17 2136 05/30/17 0306 05/30/17 0705  NA 139 140 139 140 140  K 3.6 3.5 3.7 3.5 3.2*  CL 91* 91* 90* 91* 90*  CO2 28 27 25 26 29   GLUCOSE 127* 106* 215* 143* 135*  BUN 63* 61*  59* 55* 54*  CREATININE 6.61* 6.61* 6.41* 6.34* 6.29*  CALCIUM 9.0 9.3 9.4 9.9 9.7   Liver Function Tests: No results for input(s): AST, ALT, ALKPHOS, BILITOT, PROT, ALBUMIN in the last 168 hours. No results for input(s): LIPASE, AMYLASE in the last 168 hours. No results for input(s): AMMONIA in the last 168 hours. Coagulation Profile: No results for input(s): INR, PROTIME in the last 168 hours. CBC: Recent Labs  Lab 05/28/17 2215  WBC 24.4*  HGB 9.5*  HCT 31.9*  MCV 91.9  PLT 480*   Cardiac Enzymes: No results for input(s): CKTOTAL, CKMB, CKMBINDEX, TROPONINI in the last 168 hours. BNP: Invalid input(s): POCBNP CBG: Recent Labs  Lab 05/30/17 0433 05/30/17 0529 05/30/17 0632 05/30/17 0734 05/30/17 0916  GLUCAP 148* 142* 113* 138* 170*   HbA1C: No results for input(s): HGBA1C in the last 72 hours. Urine analysis:    Component Value Date/Time   COLORURINE YELLOW 05/28/2017 [place] 05/28/2017 2209   LABSPEC 1.025 05/28/2017 2209   PHURINE 5.5 05/28/2017 2209   GLUCOSEU >=500 (A) 05/28/2017 2209   HGBUR SMALL (A) 05/28/2017 2209   BILIRUBINUR NEGATIVE 05/28/2017 2209   KETONESUR >80 (A) 05/28/2017 2209   PROTEINUR 100 (A) 05/28/2017 2209   UROBILINOGEN 1.0 11/13/2014 0155   NITRITE NEGATIVE 05/28/2017 2209   LEUKOCYTESUR NEGATIVE 05/28/2017 2209   Sepsis Labs: @LABRCNTIP (procalcitonin:4,lacticidven:4) ) Recent Results (from the past 240 hour(s))  MRSA PCR Screening     Status: None   Collection Time: 05/29/17  4:00 AM  Result Value Ref Range Status   MRSA by PCR NEGATIVE NEGATIVE Final    Comment:        The GeneXpert MRSA Assay (FDA approved for NASAL specimens only), is one component of a comprehensive MRSA colonization surveillance program. It is not intended to diagnose MRSA infection nor to guide or monitor treatment for MRSA infections. Performed at [facility], [address], [place], [place] [address]      Scheduled  Meds: . calcitRIOL  0.25 mcg Oral Daily  . [START ON 06/04/2017] cloNIDine  0.2 mg Transdermal Q Mon  . gabapentin  300 mg Oral QHS  . heparin  5,000 Units Subcutaneous Q8H  . insulin aspart  0-9 Units Subcutaneous Q4H  . insulin glargine  10 Units Subcutaneous Daily  . metoCLOPramide (REGLAN) injection  5 mg Intravenous Q6H  . metoprolol tartrate  5 mg Intravenous Q6H  . ondansetron (ZOFRAN) IV  4 mg Intravenous Q6H  . pantoprazole  40 mg Oral Daily   Continuous Infusions: . 0.9 % NaCl with KCl 40 mEq / L    . famotidine (PEPCID) IV      Procedures/Studies: Ct Abdomen Pelvis Wo Contrast  Result Date: 05/29/2017 CLINICAL DATA:  No acute abnormality seen within the abdomen or pelvis. EXAM: CT ABDOMEN AND PELVIS WITHOUT CONTRAST TECHNIQUE: Multidetector CT imaging of the abdomen and pelvis was performed following the standard protocol without IV contrast. COMPARISON:  CT of the abdomen and pelvis performed 02/02/2017 FINDINGS: Lower chest: The visualized lung bases are grossly clear. The visualized portions of the mediastinum are unremarkable. Wall thickening at the distal esophagus  likely reflects the patient's known erosive esophagitis. Hepatobiliary: The liver is unremarkable in appearance. The patient is status post cholecystectomy, with clips noted at the gallbladder fossa. The common bile duct remains normal in caliber. Pancreas: The pancreas is within normal limits. Spleen: The spleen is unremarkable in appearance. Adrenals/Urinary Tract: The adrenal glands are unremarkable in appearance. The kidneys are within normal limits. There is no evidence of hydronephrosis. No renal or ureteral stones are identified. No perinephric stranding is seen. Stomach/Bowel: The stomach is unremarkable in appearance. The small bowel is within normal limits. The appendix is not visualized; there is no evidence for appendicitis. The colon is unremarkable in appearance. Vascular/Lymphatic: Scattered calcification  is seen along the abdominal aorta and its branches. The abdominal aorta is otherwise grossly unremarkable. The inferior vena cava is grossly unremarkable. No retroperitoneal lymphadenopathy is seen. No pelvic sidewall lymphadenopathy is identified. Reproductive: The bladder is mildly distended and grossly unremarkable. The prostate is normal in size. Other: No additional soft tissue abnormalities are seen. Musculoskeletal: No acute osseous abnormalities are identified. There is slight chronic loss of height along the midthoracic spine. The visualized musculature is unremarkable in appearance. IMPRESSION: Wall thickening at the distal esophagus likely reflects the patient's known erosive esophagitis. Aortic Atherosclerosis (ICD10-I70.0). Electronically Signed   By: Garald Balding M.D.   On: 05/29/2017 02:50    Orson Eva, DO  [facility] Pager [phone]  If 7PM-7AM, please contact night-coverage [email or web address] Password [email or web address] 05/30/2017, 9:48 AM   LOS: 1 day

## 2017-05-31 LAB — GLUCOSE, CAPILLARY
GLUCOSE-CAPILLARY: 119 mg/dL — AB (ref 65–99)
GLUCOSE-CAPILLARY: 127 mg/dL — AB (ref 65–99)
GLUCOSE-CAPILLARY: 166 mg/dL — AB (ref 65–99)
GLUCOSE-CAPILLARY: 194 mg/dL — AB (ref 65–99)
GLUCOSE-CAPILLARY: 255 mg/dL — AB (ref 65–99)
Glucose-Capillary: 245 mg/dL — ABNORMAL HIGH (ref 65–99)
Glucose-Capillary: 415 mg/dL — ABNORMAL HIGH (ref 65–99)

## 2017-05-31 LAB — CBC
HCT: 31.7 % — ABNORMAL LOW (ref 39.0–52.0)
Hemoglobin: 10 g/dL — ABNORMAL LOW (ref 13.0–17.0)
MCH: 27.4 pg (ref 26.0–34.0)
MCHC: 31.5 g/dL (ref 30.0–36.0)
MCV: 86.8 fL (ref 78.0–100.0)
PLATELETS: 300 10*3/uL (ref 150–400)
RBC: 3.65 MIL/uL — ABNORMAL LOW (ref 4.22–5.81)
RDW: 15.6 % — ABNORMAL HIGH (ref 11.5–15.5)
WBC: 22.9 10*3/uL — ABNORMAL HIGH (ref 4.0–10.5)

## 2017-05-31 LAB — IRON AND TIBC
IRON: 77 ug/dL (ref 45–182)
Saturation Ratios: 30 % (ref 17.9–39.5)
TIBC: 256 ug/dL (ref 250–450)
UIBC: 179 ug/dL

## 2017-05-31 LAB — COMPREHENSIVE METABOLIC PANEL
ALBUMIN: 3.7 g/dL (ref 3.5–5.0)
ALK PHOS: 115 U/L (ref 38–126)
ALT: 19 U/L (ref 17–63)
AST: 23 U/L (ref 15–41)
Anion gap: 18 — ABNORMAL HIGH (ref 5–15)
BILIRUBIN TOTAL: 1.6 mg/dL — AB (ref 0.3–1.2)
BUN: 44 mg/dL — AB (ref 6–20)
CALCIUM: 9.6 mg/dL (ref 8.9–10.3)
CO2: 26 mmol/L (ref 22–32)
CREATININE: 5.14 mg/dL — AB (ref 0.61–1.24)
Chloride: 96 mmol/L — ABNORMAL LOW (ref 101–111)
GFR calc Af Amer: 16 mL/min — ABNORMAL LOW (ref >60)
GFR, EST NON AFRICAN AMERICAN: 13 mL/min — AB (ref >60)
GLUCOSE: 268 mg/dL — AB (ref 65–99)
Potassium: 4 mmol/L (ref 3.5–5.1)
Sodium: 140 mmol/L (ref 135–145)
TOTAL PROTEIN: 7 g/dL (ref 6.5–8.1)

## 2017-05-31 LAB — FERRITIN: Ferritin: 336 ng/mL (ref 24–336)

## 2017-05-31 MED ORDER — INSULIN ASPART 100 UNIT/ML ~~LOC~~ SOLN
0.0000 [IU] | SUBCUTANEOUS | Status: DC
  Administered 2017-05-31: 1 [IU] via SUBCUTANEOUS
  Administered 2017-05-31: 5 [IU] via SUBCUTANEOUS
  Administered 2017-05-31 – 2017-06-01 (×3): 2 [IU] via SUBCUTANEOUS
  Administered 2017-06-01: 1 [IU] via SUBCUTANEOUS
  Administered 2017-06-01: 2 [IU] via SUBCUTANEOUS

## 2017-05-31 MED ORDER — METOCLOPRAMIDE HCL 5 MG/ML IJ SOLN
10.0000 mg | Freq: Four times a day (QID) | INTRAMUSCULAR | Status: DC
  Administered 2017-05-31 (×3): 10 mg via INTRAVENOUS
  Filled 2017-05-31 (×3): qty 2

## 2017-05-31 MED ORDER — INSULIN GLARGINE 100 UNIT/ML ~~LOC~~ SOLN
20.0000 [IU] | Freq: Every day | SUBCUTANEOUS | Status: DC
  Administered 2017-05-31 – 2017-06-02 (×3): 20 [IU] via SUBCUTANEOUS
  Filled 2017-05-31 (×5): qty 0.2

## 2017-05-31 NOTE — Progress Notes (Signed)
Pt is continually heaving and spitting. Patient has also vomited several times. Pt continually asks for fluids. Pt has had fluids (non caloric and or water as ordered and continues to have nausea and large amount of sputum, occasional emesis. Despite advisement of the risks, patient continues to aggressively asks for fluids and has emesis/sputum/ nausea.

## 2017-05-31 NOTE — Progress Notes (Signed)
Inpatient Diabetes Program Recommendations  AACE/ADA: New Consensus Statement on Inpatient Glycemic Control (2015)  Target Ranges:  Prepandial:   less than 140 mg/dL      Peak postprandial:   less than 180 mg/dL (1-2 hours)      Critically ill patients:  140 - 180 mg/dL   Results for Brent Daniels, Brent Daniels (MRN [id number]) as of 05/31/2017 07:33  Ref. Range 05/30/2017 11:23 05/30/2017 12:17 05/30/2017 14:49 05/30/2017 16:41 05/30/2017 18:14 05/30/2017 19:37 05/31/2017 00:28 05/31/2017 04:25  Glucose-Capillary Latest Ref Range: 65 - 99 mg/dL 207 (H)  Lantus 10 units 186 (H)  Insulin drip stopped 329 (H)  Novolog 11 units 284 (H) 205 (H)  Novolog 5 units 156 (H)  Novolog 3 units 194 (H)  Novolog 3 units 245 (H)   Review of Glycemic Control  Diabetes history: DM1 (makes NO insulin; requires basal, correction, and meal coverage insulin) Outpatient Diabetes medications: Lantus 15 units daily, Novolin Regular 0-16 units TID with meals Current orders for Inpatient glycemic control: Lantus 10 units daily, Novolog 0-15 units Q4H  Inpatient Diabetes Program Recommendations: Insulin - Basal: Please consider increasing Lantus to 20 units daily. Correction (SSI): Please consider decreasing Novolog correction to 0-9 units Q4H. Insulin-Meal Coverage: Once diet is ordered and patient is eating without vomiting, please consider ordering Novolog 4 units TID with meals for meal coverage if patient eats at least 50% of meals.  Thanks, Barnie Alderman, RN, MSN, CDE Diabetes Coordinator Inpatient Diabetes Program [phone] (Team Pager from 8am to 5pm)

## 2017-05-31 NOTE — Progress Notes (Signed)
Pt was compliant with meds before midnight. Pt was NOT compliant with meds after mifnight Pt was NOT compliant with monitoring throughout shift to this point. Would not allow accurate BP placement SPO2 etc

## 2017-05-31 NOTE — Progress Notes (Signed)
PROGRESS NOTE  Brent Daniels EEF:[id number] DOB: 13-Nov-1985 DOA: 05/28/2017 PCP: [name], MD  Brief History:  32 year old male with a history of type 1 diabetes mellitus, diabetic gastroparesis, CKD stage V, tobacco abuse, chronic abdominal pain, erosive esophagitis, hypertension, GERD presenting as an involuntary commitment by his mother with whom he lives.  Apparently, the patient has not been taking his insulin, and has had 3 days of nausea, vomiting, and abdominal pain.  CBG at home was reading high.  The patient has been denying suicidal and homicidal ideations.  Patient presented with a serum glucose of 889, bicarbonate 13, and anion gap >20.  He was started on IV fluids, IV insulin, and his home antihypertensive medications.  Unfortunately, the patient continues to have intractable vomiting despite resolution of his DKA.  Assessment/Plan: DKA, type I -patient started on IV insulin with q 1 hour CBG check and q 4 hour BMPs -pt started on aggressive fluid resuscitation -Electrolytes were monitored and repleted -transitioned to [place] insulin as bicarb stable at 29-30 -Patient continues to have elevated anion gap secondary to acute on chronic renal failure and impaired bicarbonate reabsorption -02/20/2017 HbA1C--8.5  Intractable vomiting -secondary to gastroparesis -Start around-the-clock Zofran -Start IV Reglan--increase to 10 mg q 6 hours -Two-view abdominal x-ray--neg for obstruction or ileus -Patient with a history of erosive esophagitis--> continue PPI -GI consult if no improvement -Urinalysis negative for pyuria -check lipase--19 -check LFTs--normal -05/29/2017 CT abdomen--wall thickening of the distal esophagus likely secondary to erosive esophagitis, status post cholecystectomy, negative colon, negative CBD  Uncontrolled hypertension -Continue IV Lopressor in the setting of intractable vomiting -Continue transdermal clonidine home dose -Holding carvedilol  and amlodipine secondary to intractable vomiting -hydralazine 10 mg prn SBP >180  Acute on chronic renal failure--CKD stage V -Baseline creatinine 4.1-4.5 -Serum creatinine peaked at 7.60 -Continue IV fluids -Patient appears clinically volume depleted -AV fistula placed 04/09/2017  Diabetes mellitus type 1, uncontrolled -increase Lantus to 20 units -continue novolog sliding scale  Tobacco abuse -Tobacco cessation discussed  Chronic abdominal pain -Likely secondary to his gastroparesis  Involuntary commitment -Patient denies suicidal and homicidal ideation -He has capacity to make medical decisions; although the patient has made poor choices regarding his health, he expressed understanding of the severity of his current medical condition  Anemia of CKD -Check iron studies -Baseline hemoglobin 9-10  Statement of Capacity -In speaking with [name], he demonstrated the ability to understand the serious nature and consequences of leaving the hospital prior to completion of an adequate course of treatment. He was able to articulate the consequences of leaving prior to completion of adequate therapy.  He has consented to continued hospital treatment.        Disposition Plan:   Home in 2-3 days  Family Communication:   No Family at bedside  Consultants:  none  Code Status:  FULL  DVT Prophylaxis:  [place] Heparin    Procedures: As Listed in Progress Note Above  Antibiotics: None      Subjective: Patient continues to have intermittent vomiting but it is a little better than yesterday.  Continues to complain of chronic abdominal pain.  He denies any chest pain, shortness breath, headache, neck pain, fevers, chills, dysuria, hematuria.  Objective: Vitals:   05/31/17 0500 05/31/17 0600 05/31/17 0700 05/31/17 0810  BP:      Pulse:      Resp: 13 16 14    Temp:  99.4 F (37.4 C)  TempSrc:    Oral  SpO2:      Weight: 84 kg (185 lb 3 oz)      Height:        Intake/Output Summary (Last 24 hours) at 05/31/2017 1020 Last data filed at 05/31/2017 1014 Gross per 24 hour  Intake 2616.6 ml  Output 1475 ml  Net 1141.6 ml   Weight change:  Exam:   General:  Pt is alert, follows commands appropriately, not in acute distress  HEENT: No icterus, No thrush, No neck mass, [place]/AT  Cardiovascular: RRR, S1/S2, no rubs, no gallops  Respiratory: CTA bilaterally, no wheezing, no crackles, no rhonchi  Abdomen: Soft/+BS, epigastric tender, non distended, no guarding  Extremities: No edema, No lymphangitis, No petechiae, No rashes, no synovitis   Data Reviewed: I have personally reviewed following labs and imaging studies Basic Metabolic Panel: Recent Labs  Lab 05/29/17 1837 05/29/17 2136 05/30/17 0306 05/30/17 0705 05/31/17 0422  NA 140 139 140 140 140  K 3.5 3.7 3.5 3.2* 4.0  CL 91* 90* 91* 90* 96*  CO2 27 25 26 29 26   GLUCOSE 106* 215* 143* 135* 268*  BUN 61* 59* 55* 54* 44*  CREATININE 6.61* 6.41* 6.34* 6.29* 5.14*  CALCIUM 9.3 9.4 9.9 9.7 9.6   Liver Function Tests: Recent Labs  Lab 05/30/17 0705 05/31/17 0422  AST 26 23  ALT 15* 19  ALKPHOS 129* 115  BILITOT 1.4* 1.6*  PROT 7.9 7.0  ALBUMIN 4.2 3.7   Recent Labs  Lab 05/30/17 0705  LIPASE 19   No results for input(s): AMMONIA in the last 168 hours. Coagulation Profile: No results for input(s): INR, PROTIME in the last 168 hours. CBC: Recent Labs  Lab 05/28/17 2215 05/31/17 0422  WBC 24.4* 22.9*  HGB 9.5* 10.0*  HCT 31.9* 31.7*  MCV 91.9 86.8  PLT 480* 300   Cardiac Enzymes: No results for input(s): CKTOTAL, CKMB, CKMBINDEX, TROPONINI in the last 168 hours. BNP: Invalid input(s): POCBNP CBG: Recent Labs  Lab 05/30/17 1814 05/30/17 1937 05/31/17 0028 05/31/17 0425 05/31/17 0736  GLUCAP 205* 156* 194* 245* 415*   HbA1C: No results for input(s): HGBA1C in the last 72 hours. Urine analysis:    Component Value Date/Time   COLORURINE  YELLOW 05/28/2017 [place] 05/28/2017 2209   LABSPEC 1.025 05/28/2017 2209   PHURINE 5.5 05/28/2017 2209   GLUCOSEU >=500 (A) 05/28/2017 2209   HGBUR SMALL (A) 05/28/2017 2209   BILIRUBINUR NEGATIVE 05/28/2017 2209   KETONESUR >80 (A) 05/28/2017 2209   PROTEINUR 100 (A) 05/28/2017 2209   UROBILINOGEN 1.0 11/13/2014 0155   NITRITE NEGATIVE 05/28/2017 2209   LEUKOCYTESUR NEGATIVE 05/28/2017 2209   Sepsis Labs: @LABRCNTIP (procalcitonin:4,lacticidven:4) ) Recent Results (from the past 240 hour(s))  MRSA PCR Screening     Status: None   Collection Time: 05/29/17  4:00 AM  Result Value Ref Range Status   MRSA by PCR NEGATIVE NEGATIVE Final    Comment:        The GeneXpert MRSA Assay (FDA approved for NASAL specimens only), is one component of a comprehensive MRSA colonization surveillance program. It is not intended to diagnose MRSA infection nor to guide or monitor treatment for MRSA infections. Performed at [facility], [address], [place], [place] [address]      Scheduled Meds: . calcitRIOL  0.25 mcg Oral Daily  . [START ON 06/04/2017] cloNIDine  0.2 mg Transdermal Q Mon  . gabapentin  300  mg Oral QHS  . heparin  5,000 Units Subcutaneous Q8H  . insulin aspart  0-9 Units Subcutaneous Q4H  . insulin glargine  20 Units Subcutaneous Daily  . metoCLOPramide (REGLAN) injection  10 mg Intravenous Q6H  . metoprolol tartrate  5 mg Intravenous Q6H  . ondansetron (ZOFRAN) IV  4 mg Intravenous Q6H  . pantoprazole  40 mg Oral Daily   Continuous Infusions: . 0.9 % NaCl with KCl 40 mEq / L 125 mL/hr at 05/31/17 0700  . famotidine (PEPCID) IV Stopped (05/30/17 1150)    Procedures/Studies: Ct Abdomen Pelvis Wo Contrast  Result Date: 05/29/2017 CLINICAL DATA:  No acute abnormality seen within the abdomen or pelvis. EXAM: CT ABDOMEN AND PELVIS WITHOUT CONTRAST TECHNIQUE: Multidetector CT imaging of the abdomen and pelvis was performed following the standard  protocol without IV contrast. COMPARISON:  CT of the abdomen and pelvis performed 02/02/2017 FINDINGS: Lower chest: The visualized lung bases are grossly clear. The visualized portions of the mediastinum are unremarkable. Wall thickening at the distal esophagus likely reflects the patient's known erosive esophagitis. Hepatobiliary: The liver is unremarkable in appearance. The patient is status post cholecystectomy, with clips noted at the gallbladder fossa. The common bile duct remains normal in caliber. Pancreas: The pancreas is within normal limits. Spleen: The spleen is unremarkable in appearance. Adrenals/Urinary Tract: The adrenal glands are unremarkable in appearance. The kidneys are within normal limits. There is no evidence of hydronephrosis. No renal or ureteral stones are identified. No perinephric stranding is seen. Stomach/Bowel: The stomach is unremarkable in appearance. The small bowel is within normal limits. The appendix is not visualized; there is no evidence for appendicitis. The colon is unremarkable in appearance. Vascular/Lymphatic: Scattered calcification is seen along the abdominal aorta and its branches. The abdominal aorta is otherwise grossly unremarkable. The inferior vena cava is grossly unremarkable. No retroperitoneal lymphadenopathy is seen. No pelvic sidewall lymphadenopathy is identified. Reproductive: The bladder is mildly distended and grossly unremarkable. The prostate is normal in size. Other: No additional soft tissue abnormalities are seen. Musculoskeletal: No acute osseous abnormalities are identified. There is slight chronic loss of height along the midthoracic spine. The visualized musculature is unremarkable in appearance. IMPRESSION: Wall thickening at the distal esophagus likely reflects the patient's known erosive esophagitis. Aortic Atherosclerosis (ICD10-I70.0). Electronically Signed   By: Garald Balding M.D.   On: 05/29/2017 02:50   Dg Abd 2 Views  Result Date:  05/30/2017 CLINICAL DATA:  Intractable vomiting. EXAM: ABDOMEN - 2 VIEW COMPARISON:  Radiographs of December 03, 2016. FINDINGS: The bowel gas pattern is normal. There is no evidence of free air. Status post cholecystectomy. No radio-opaque calculi or other significant radiographic abnormality is seen. IMPRESSION: No evidence of bowel obstruction or ileus. Electronically Signed   By: Marijo Conception, M.D.   On: 05/30/2017 12:38    Orson Eva, DO  [facility] Pager [phone]  If 7PM-7AM, please contact night-coverage [email or web address] Password [email or web address] 05/31/2017, 10:20 AM   LOS: 2 days

## 2017-05-31 NOTE — Progress Notes (Signed)
Patient got up from bed, removed monitors and started to drink water from the sink after being advised of risks of continued large intake due to constant nausea and vomiting. Pt is noncompliant with medications and limited fluid intake at this point. Called Hospitalist to evaluate patient and discuss status of treatment and risks involved with refusal of medications et Al. Patient acknowledged offers of alternate treatment and refused these.  Patient acknowledges risks of refusing medications and monitoring.

## 2017-05-31 NOTE — Progress Notes (Signed)
CRITICAL VALUE ALERT  Critical Value:  BG 415  Date & Time Notied:  05/31/17 0740  Provider Notified: Dr. Carles Collet  Orders Received/Actions taken: keep NPO give 15 unit insulin novolog  15 units administered at 0755

## 2017-06-01 DIAGNOSIS — N185 Chronic kidney disease, stage 5: Secondary | ICD-10-CM

## 2017-06-01 DIAGNOSIS — N170 Acute kidney failure with tubular necrosis: Secondary | ICD-10-CM

## 2017-06-01 LAB — BASIC METABOLIC PANEL
Anion gap: 12 (ref 5–15)
BUN: 40 mg/dL — AB (ref 6–20)
CALCIUM: 9.5 mg/dL (ref 8.9–10.3)
CHLORIDE: 97 mmol/L — AB (ref 101–111)
CO2: 28 mmol/L (ref 22–32)
CREATININE: 4.5 mg/dL — AB (ref 0.61–1.24)
GFR calc Af Amer: 18 mL/min — ABNORMAL LOW (ref >60)
GFR calc non Af Amer: 16 mL/min — ABNORMAL LOW (ref >60)
Glucose, Bld: 174 mg/dL — ABNORMAL HIGH (ref 65–99)
Potassium: 3.8 mmol/L (ref 3.5–5.1)
Sodium: 137 mmol/L (ref 135–145)

## 2017-06-01 LAB — GLUCOSE, CAPILLARY
GLUCOSE-CAPILLARY: 185 mg/dL — AB (ref 65–99)
Glucose-Capillary: 158 mg/dL — ABNORMAL HIGH (ref 65–99)
Glucose-Capillary: 183 mg/dL — ABNORMAL HIGH (ref 65–99)
Glucose-Capillary: 123 mg/dL — ABNORMAL HIGH (ref 65–99)
Glucose-Capillary: 89 mg/dL (ref 65–99)

## 2017-06-01 LAB — CBC
HCT: 32 % — ABNORMAL LOW (ref 39.0–52.0)
Hemoglobin: 10 g/dL — ABNORMAL LOW (ref 13.0–17.0)
MCH: 26.8 pg (ref 26.0–34.0)
MCHC: 31.3 g/dL (ref 30.0–36.0)
MCV: 85.8 fL (ref 78.0–100.0)
PLATELETS: 246 10*3/uL (ref 150–400)
RBC: 3.73 MIL/uL — ABNORMAL LOW (ref 4.22–5.81)
RDW: 14.9 % (ref 11.5–15.5)
WBC: 13.4 10*3/uL — ABNORMAL HIGH (ref 4.0–10.5)

## 2017-06-01 MED ORDER — ONDANSETRON HCL 4 MG PO TABS
4.0000 mg | ORAL_TABLET | Freq: Four times a day (QID) | ORAL | Status: DC
  Administered 2017-06-01 – 2017-06-02 (×5): 4 mg via ORAL
  Filled 2017-06-01 (×5): qty 1

## 2017-06-01 MED ORDER — CLONIDINE HCL 0.1 MG PO TABS
0.1000 mg | ORAL_TABLET | Freq: Once | ORAL | Status: AC
  Administered 2017-06-01: 0.1 mg via ORAL
  Filled 2017-06-01: qty 1

## 2017-06-01 MED ORDER — AMLODIPINE BESYLATE 5 MG PO TABS
10.0000 mg | ORAL_TABLET | Freq: Every day | ORAL | Status: DC
  Administered 2017-06-01 – 2017-06-02 (×2): 10 mg via ORAL
  Filled 2017-06-01 (×2): qty 2

## 2017-06-01 MED ORDER — METOCLOPRAMIDE HCL 10 MG PO TABS
10.0000 mg | ORAL_TABLET | Freq: Three times a day (TID) | ORAL | Status: DC
  Administered 2017-06-01 – 2017-06-02 (×5): 10 mg via ORAL
  Filled 2017-06-01 (×5): qty 1

## 2017-06-01 MED ORDER — SODIUM CHLORIDE 0.9% FLUSH
10.0000 mL | Freq: Two times a day (BID) | INTRAVENOUS | Status: DC
  Administered 2017-06-01: 30 mL
  Administered 2017-06-01: 10 mL

## 2017-06-01 MED ORDER — CLONIDINE HCL 0.3 MG/24HR TD PTWK
0.3000 mg | MEDICATED_PATCH | TRANSDERMAL | Status: DC
  Administered 2017-06-01: 0.3 mg via TRANSDERMAL
  Filled 2017-06-01: qty 1

## 2017-06-01 MED ORDER — AMLODIPINE BESYLATE 5 MG PO TABS: 10.0000 mg | ORAL_TABLET | Freq: Every day | ORAL | Status: DC

## 2017-06-01 MED ORDER — METOPROLOL TARTRATE 50 MG PO TABS: 50.0000 mg | ORAL_TABLET | Freq: Two times a day (BID) | ORAL | Status: DC

## 2017-06-01 MED ORDER — CARVEDILOL 12.5 MG PO TABS
12.5000 mg | ORAL_TABLET | Freq: Two times a day (BID) | ORAL | Status: DC
  Administered 2017-06-01 (×2): 12.5 mg via ORAL
  Filled 2017-06-01 (×2): qty 1

## 2017-06-01 MED ORDER — SODIUM CHLORIDE 0.9% FLUSH: 10.0000 mL | INTRAVENOUS | Status: DC | PRN

## 2017-06-01 MED ORDER — CHLORHEXIDINE GLUCONATE CLOTH 2 % EX PADS
6.0000 | MEDICATED_PAD | Freq: Every day | CUTANEOUS | Status: DC
  Administered 2017-06-01: 6 via TOPICAL

## 2017-06-01 NOTE — Op Note (Signed)
Patient:  CLAIR ALFIERI  DOB:  05/30/1985  MRN:  [id number]   Preop Diagnosis: Renal failure, need for central venous access  Postop Diagnosis: Same  Procedure: Central line insertion  Surgeon: Aviva Signs, MD  Anes: Local  Indications: Patient is a 32 year old white male with chronic renal failure as well as diabetic ketoacidosis, status post fistula placement recently who has limited IV access.  I have been asked to place a central line.  The risks and benefits of the procedure were fully explained to the patient, who gave informed consent.  Procedure note: The right groin was prepped and draped using the usual sterile technique with ChloraPrep.  Surgical site confirmation was performed.  A drape, mask, gown, gloves were all used for the procedure.  1% Xylocaine was used for local anesthesia.  The right femoral vein triple-lumen catheter was inserted using the Seldinger technique without difficulty.  Good backflow blood was noted on aspiration of all 3 ports.  All 3 ports were flushed with saline.  A dry sterile dressing was applied.  The patient tolerated procedure well.  Complications: None  EBL: Minimal  Specimen: None

## 2017-06-01 NOTE — Progress Notes (Signed)
PROGRESS NOTE  Brent Daniels KZS:[id number] DOB: 07-Oct-1985 DOA: 05/28/2017 PCP: [name], MD  Brief History: 32 year old male with a history of type 1 diabetes mellitus, diabetic gastroparesis, CKD stage V, tobacco abuse, chronic abdominal pain, erosive esophagitis, hypertension, GERD presenting as an involuntary commitment by his mother with whom he lives. Apparently, the patient has not been taking his insulin, and has had 3 days of nausea, vomiting, and abdominal pain. CBG at home was reading high. The patient has been denying suicidal and homicidal ideations. Patient presented with a serum glucose of 889, bicarbonate 13, and anion gap >20.He was started on IV fluids, IV insulin, and his home antihypertensive medications. Unfortunately, the patient continues to have intractable vomiting despite resolution of his DKA.  Assessment/Plan: DKA, type I -patient started on IV insulin with q 1 hour CBG check and q 4 hour BMPs -pt started on aggressive fluid resuscitation -Electrolytes were monitored and repleted -transitioned to [place] insulinas bicarb stable at 29-30 -Patient continues to have elevated anion gap secondary to acute on chronic renal failure and impaired bicarbonate reabsorption -1/15/2019HbA1C--8.5  Intractable vomiting -secondary to gastroparesis -continue around-the-clock Zofran -continue IV Reglan--increase to 10 mg q 6 hours -Two-view abdominal x-ray--neg for obstruction or ileus -Patient with a history of erosive esophagitis-->continue PPI -slowly improving -continue clear liquids -Urinalysis negative for pyuria -check lipase--19 -check LFTs--normal -05/29/2017 CT abdomen--wall thickening of the distal esophagus likely secondary to erosive esophagitis, status post cholecystectomy, negative colon, negative CBD  Uncontrolled hypertension -Continue IV Lopressor in the setting of intractable vomiting-->try to restart po coreg -Continue  transdermal clonidine-->increase to 0.3 mg -Holding carvedilol and amlodipine secondary to intractable vomiting-->try to restart po -hydralazine 10 mg prn SBP >180  Acute on chronic renal failure--CKD stage V -Baseline creatinine 4.1-4.5 -Serum creatinine peaked at 7.60 -Continue IV fluids -Patient appears clinically volume depleted -AV fistula placed 04/09/2017  Diabetes mellitus type 1, uncontrolled -increase Lantus to 24 units -continue novolog sliding scale  Tobacco abuse -Tobacco cessation discussed  Chronic abdominal pain -Likely secondary to his gastroparesis  Anemia of CKD -Check iron studies -Baseline hemoglobin9-10  Statement of Capacity -In speaking with [name], he demonstrated the ability to understand the serious nature and consequences of leaving the hospital prior to completion of an adequate course of treatment. He was able to articulate the consequences of leaving prior to completion of adequate therapy.  He has consented to continued hospital treatment.     Disposition Plan: Home in 1-2days  Family Communication:NoFamily at bedside  Consultants:none  Code Status: FULL  DVT Prophylaxis: [place] Heparin    Procedures: As Listed in Progress Note Above  Antibiotics: None     Subjective: Patient has not had any vomiting in last 24 hours.  He still complains of abdominal pain.  He is tolerating clear liquids.  He denies any headache, chest pain, vomiting, diarrhea, dysuria, hematuria, headache, neck pain.  Objective: Vitals:   06/01/17 0205 06/01/17 0400 06/01/17 0640 06/01/17 0721  BP: (!) 210/97 (!) 193/97 (!) 202/102   Pulse: 77 78 77   Resp: 17 13 12 15   Temp:   98.7 F (37.1 C) 98.9 F (37.2 C)  TempSrc:   Oral Oral  SpO2: 99% 98% 97%   Weight:      Height:        Intake/Output Summary (Last 24 hours) at 06/01/2017 0723 Last data filed at 05/31/2017 1055 Gross per 24 hour  Intake 235 ml    Output -  Net 235 ml   Weight change:  Exam:   General:  Pt is alert, follows commands appropriately, not in acute distress  HEENT: No icterus, No thrush, No neck mass, [place]/AT  Cardiovascular: RRR, S1/S2, no rubs, no gallops  Respiratory: CTA bilaterally, no wheezing, no crackles, no rhonchi  Abdomen: Soft/+BS, non tender, non distended, no guarding  Extremities: No edema, No lymphangitis, No petechiae, No rashes, no synovitis   Data Reviewed: I have personally reviewed following labs and imaging studies Basic Metabolic Panel: Recent Labs  Lab 05/29/17 2136 05/30/17 0306 05/30/17 0705 05/31/17 0422 06/01/17 0418  NA 139 140 140 140 137  K 3.7 3.5 3.2* 4.0 3.8  CL 90* 91* 90* 96* 97*  CO2 25 26 29 26 28   GLUCOSE 215* 143* 135* 268* 174*  BUN 59* 55* 54* 44* 40*  CREATININE 6.41* 6.34* 6.29* 5.14* 4.50*  CALCIUM 9.4 9.9 9.7 9.6 9.5   Liver Function Tests: Recent Labs  Lab 05/30/17 0705 05/31/17 0422  AST 26 23  ALT 15* 19  ALKPHOS 129* 115  BILITOT 1.4* 1.6*  PROT 7.9 7.0  ALBUMIN 4.2 3.7   Recent Labs  Lab 05/30/17 0705  LIPASE 19   No results for input(s): AMMONIA in the last 168 hours. Coagulation Profile: No results for input(s): INR, PROTIME in the last 168 hours. CBC: Recent Labs  Lab 05/28/17 2215 05/31/17 0422 06/01/17 0418  WBC 24.4* 22.9* 13.4*  HGB 9.5* 10.0* 10.0*  HCT 31.9* 31.7* 32.0*  MCV 91.9 86.8 85.8  PLT 480* 300 246   Cardiac Enzymes: No results for input(s): CKTOTAL, CKMB, CKMBINDEX, TROPONINI in the last 168 hours. BNP: Invalid input(s): POCBNP CBG: Recent Labs  Lab 05/31/17 1707 05/31/17 1931 05/31/17 2332 06/01/17 0418 06/01/17 0721  GLUCAP 166* 127* 119* 158* 183*   HbA1C: No results for input(s): HGBA1C in the last 72 hours. Urine analysis:    Component Value Date/Time   COLORURINE YELLOW 05/28/2017 [place] 05/28/2017 2209   LABSPEC 1.025 05/28/2017 2209   PHURINE 5.5 05/28/2017  2209   GLUCOSEU >=500 (A) 05/28/2017 2209   HGBUR SMALL (A) 05/28/2017 2209   BILIRUBINUR NEGATIVE 05/28/2017 2209   KETONESUR >80 (A) 05/28/2017 2209   PROTEINUR 100 (A) 05/28/2017 2209   UROBILINOGEN 1.0 11/13/2014 0155   NITRITE NEGATIVE 05/28/2017 2209   LEUKOCYTESUR NEGATIVE 05/28/2017 2209   Sepsis Labs: @LABRCNTIP (procalcitonin:4,lacticidven:4) ) Recent Results (from the past 240 hour(s))  MRSA PCR Screening     Status: None   Collection Time: 05/29/17  4:00 AM  Result Value Ref Range Status   MRSA by PCR NEGATIVE NEGATIVE Final    Comment:        The GeneXpert MRSA Assay (FDA approved for NASAL specimens only), is one component of a comprehensive MRSA colonization surveillance program. It is not intended to diagnose MRSA infection nor to guide or monitor treatment for MRSA infections. Performed at [facility], [address], [place], [place] [address]      Scheduled Meds: . amLODipine  10 mg Oral Daily  . calcitRIOL  0.25 mcg Oral Daily  . cloNIDine  0.3 mg Transdermal Weekly  . cloNIDine  0.1 mg Oral Once  . gabapentin  300 mg Oral QHS  . heparin  5,000 Units Subcutaneous Q8H  . insulin aspart  0-9 Units Subcutaneous Q4H  . insulin glargine  20 Units Subcutaneous Daily  . metoCLOPramide (REGLAN) injection  10  mg Intravenous Q6H  . metoprolol tartrate  5 mg Intravenous Q6H  . ondansetron (ZOFRAN) IV  4 mg Intravenous Q6H  . pantoprazole  40 mg Oral Daily   Continuous Infusions: . 0.9 % NaCl with KCl 40 mEq / L Stopped (06/01/17 0545)  . famotidine (PEPCID) IV Stopped (05/31/17 1055)    Procedures/Studies: Ct Abdomen Pelvis Wo Contrast  Result Date: 05/29/2017 CLINICAL DATA:  No acute abnormality seen within the abdomen or pelvis. EXAM: CT ABDOMEN AND PELVIS WITHOUT CONTRAST TECHNIQUE: Multidetector CT imaging of the abdomen and pelvis was performed following the standard protocol without IV contrast. COMPARISON:  CT of the abdomen and pelvis performed  02/02/2017 FINDINGS: Lower chest: The visualized lung bases are grossly clear. The visualized portions of the mediastinum are unremarkable. Wall thickening at the distal esophagus likely reflects the patient's known erosive esophagitis. Hepatobiliary: The liver is unremarkable in appearance. The patient is status post cholecystectomy, with clips noted at the gallbladder fossa. The common bile duct remains normal in caliber. Pancreas: The pancreas is within normal limits. Spleen: The spleen is unremarkable in appearance. Adrenals/Urinary Tract: The adrenal glands are unremarkable in appearance. The kidneys are within normal limits. There is no evidence of hydronephrosis. No renal or ureteral stones are identified. No perinephric stranding is seen. Stomach/Bowel: The stomach is unremarkable in appearance. The small bowel is within normal limits. The appendix is not visualized; there is no evidence for appendicitis. The colon is unremarkable in appearance. Vascular/Lymphatic: Scattered calcification is seen along the abdominal aorta and its branches. The abdominal aorta is otherwise grossly unremarkable. The inferior vena cava is grossly unremarkable. No retroperitoneal lymphadenopathy is seen. No pelvic sidewall lymphadenopathy is identified. Reproductive: The bladder is mildly distended and grossly unremarkable. The prostate is normal in size. Other: No additional soft tissue abnormalities are seen. Musculoskeletal: No acute osseous abnormalities are identified. There is slight chronic loss of height along the midthoracic spine. The visualized musculature is unremarkable in appearance. IMPRESSION: Wall thickening at the distal esophagus likely reflects the patient's known erosive esophagitis. Aortic Atherosclerosis (ICD10-I70.0). Electronically Signed   By: Garald Balding M.D.   On: 05/29/2017 02:50   Dg Abd 2 Views  Result Date: 05/30/2017 CLINICAL DATA:  Intractable vomiting. EXAM: ABDOMEN - 2 VIEW COMPARISON:   Radiographs of December 03, 2016. FINDINGS: The bowel gas pattern is normal. There is no evidence of free air. Status post cholecystectomy. No radio-opaque calculi or other significant radiographic abnormality is seen. IMPRESSION: No evidence of bowel obstruction or ileus. Electronically Signed   By: Marijo Conception, M.D.   On: 05/30/2017 12:38    Orson Eva, DO  [facility] Pager [phone]  If 7PM-7AM, please contact night-coverage [email or web address] Password [email or web address] 06/01/2017, 7:23 AM   LOS: 3 days

## 2017-06-01 NOTE — Progress Notes (Signed)
IV paused do to infiltration.

## 2017-06-01 NOTE — Progress Notes (Signed)
Unable to start IV per charge nurse Iv was a hard stick and the ER doctor had to get the iv. Dr. Carles Collet made aware.

## 2017-06-01 NOTE — Progress Notes (Signed)
Spoke with vascular access team who state they do not insert IJ's and pt will need central line inserted. Dr. Carles Collet notified.

## 2017-06-02 DIAGNOSIS — R1013 Epigastric pain: Secondary | ICD-10-CM

## 2017-06-02 LAB — GLUCOSE, CAPILLARY
GLUCOSE-CAPILLARY: 115 mg/dL — AB (ref 65–99)
Glucose-Capillary: 94 mg/dL (ref 65–99)
Glucose-Capillary: 87 mg/dL (ref 65–99)

## 2017-06-02 LAB — BASIC METABOLIC PANEL
ANION GAP: 9 (ref 5–15)
BUN: 33 mg/dL — ABNORMAL HIGH (ref 6–20)
CALCIUM: 9.3 mg/dL (ref 8.9–10.3)
CO2: 27 mmol/L (ref 22–32)
CREATININE: 4.25 mg/dL — AB (ref 0.61–1.24)
Chloride: 102 mmol/L (ref 101–111)
GFR calc non Af Amer: 17 mL/min — ABNORMAL LOW (ref >60)
GFR, EST AFRICAN AMERICAN: 20 mL/min — AB (ref >60)
GLUCOSE: 95 mg/dL (ref 65–99)
Potassium: 4.3 mmol/L (ref 3.5–5.1)
Sodium: 138 mmol/L (ref 135–145)

## 2017-06-02 LAB — CBC
HCT: 29.4 % — ABNORMAL LOW (ref 39.0–52.0)
HEMOGLOBIN: 9.3 g/dL — AB (ref 13.0–17.0)
MCH: 27 pg (ref 26.0–34.0)
MCHC: 31.6 g/dL (ref 30.0–36.0)
MCV: 85.5 fL (ref 78.0–100.0)
PLATELETS: 199 10*3/uL (ref 150–400)
RBC: 3.44 MIL/uL — ABNORMAL LOW (ref 4.22–5.81)
RDW: 14.7 % (ref 11.5–15.5)
WBC: 8.6 10*3/uL (ref 4.0–10.5)

## 2017-06-02 MED ORDER — CARVEDILOL 12.5 MG PO TABS
25.0000 mg | ORAL_TABLET | Freq: Two times a day (BID) | ORAL | Status: DC
  Administered 2017-06-02: 25 mg via ORAL
  Filled 2017-06-02: qty 2

## 2017-06-02 MED ORDER — INSULIN ASPART 100 UNIT/ML ~~LOC~~ SOLN: 0.0000 [IU] | Freq: Three times a day (TID) | SUBCUTANEOUS | Status: DC

## 2017-06-02 MED ORDER — CARVEDILOL 25 MG PO TABS: 25.0000 mg | ORAL_TABLET | Freq: Two times a day (BID) | ORAL | 1 refills | Status: DC

## 2017-06-02 MED ORDER — INSULIN ASPART 100 UNIT/ML ~~LOC~~ SOLN: 0.0000 [IU] | Freq: Every day | SUBCUTANEOUS | Status: DC

## 2017-06-02 MED ORDER — CLONIDINE 0.3 MG/24HR TD PTWK: 0.3000 mg | MEDICATED_PATCH | TRANSDERMAL | 1 refills | Status: DC

## 2017-06-02 MED ORDER — INSULIN GLARGINE 100 UNIT/ML SOLOSTAR PEN: 20.0000 [IU] | PEN_INJECTOR | Freq: Every day | SUBCUTANEOUS | 1 refills | Status: DC

## 2017-06-02 MED ORDER — CALCITRIOL 0.25 MCG PO CAPS: 0.2500 ug | ORAL_CAPSULE | Freq: Every day | ORAL | 1 refills | Status: DC

## 2017-06-02 NOTE — Progress Notes (Signed)
Central line removed without complications. Gauze with vaseline applied with a tegaderm dressing. Pressure held for 5 minutes.

## 2017-06-02 NOTE — Discharge Summary (Signed)
Physician Discharge Summary  Brent Daniels DIY:[id number] DOB: [birth year] DOA: 05/28/2017  PCP: Brent Rim, MD  Admit date: 05/28/2017 Discharge date: 06/02/2017  Admitted From: HOME Disposition:  Home   Recommendations for Outpatient Follow-up:  1. Follow up with PCP in 1-2 weeks 2. Please obtain BMP/CBC in one week     Discharge Condition: Stable CODE STATUS: FULL Diet recommendation:Carb Modified   Brief/Interim Summary: 32 year old male with a history of type 1 diabetes mellitus, diabetic gastroparesis, CKD stage V, tobacco abuse, chronic abdominal pain, erosive esophagitis, hypertension, GERD presenting as an involuntary commitment by his mother with whom he lives. Apparently, the patient has not been taking his insulin, and has had 3 days of nausea, vomiting, and abdominal pain. CBG at home was reading high. The patient has been denying suicidal and homicidal ideations.  The patient has a well-documented history of poor compliance. Patient presented with a serum glucose of 889, bicarbonate 13, and anion gap >20.He was started on IV fluids, IV insulin, and his home antihypertensive medications. Unfortunately, the patient continues to have intractable vomiting despite resolution of his DKA.  The patient was started on a PPI and Reglan.  He was continued on fluid resuscitation.  Slowly, the patient improved.  His diet was advanced which he ultimately tolerated.  The patient had uncontrolled hypertension.  His IV antihypertensive medications were subsequently converted to po, and his blood pressure gradually improved.    Discharge Diagnoses:  DKA, type I -patient started on IV insulin with q 1 hour CBG check and q 4 hour BMPs -pt started on aggressive fluid resuscitation -Electrolytes were monitored and repleted -transitioned to Brent Daniels insulinas bicarb stable at 29-30 -Patient continues to have elevated anion gap secondary to acute on chronic renal failure and impaired  bicarbonate reabsorption -this ultimately improved with continued fluid resuscitation -1/15/2019HbA1C--8.5  Intractable vomiting -slow to improve -secondary to gastroparesis -continue around-the-clock Zofran -continue IV Reglan--increase to 10 mg q 6 hours -Two-view abdominal x-ray--neg for obstruction or ileus -Patient with a history of erosive esophagitis-->continuePPI -continue clear liquids>>advanced to soft diet which pt tolerated -Urinalysis negative for pyuria -check lipase--19 -check LFTs--normal -05/29/2017 CT abdomen--wall thickening of the distal esophagus likely secondary to erosive esophagitis, status post cholecystectomy, negative colon, negative CBD  Uncontrolled hypertension -ContinueIV Lopressor in the setting of intractable vomiting-->try to restart po coreg -Continue transdermal clonidine-->increase to 0.3 mg -Holding carvedilol and amlodipine secondary to intractable vomiting-->try to restart po -The patient will be discharged home with amlodipine 10 mg daily, carvedilol 25 mg twice daily, clonidine TTS 3 patch -hydralazine 10 mg prn SBP >180  Acute on chronic renal failure--CKD stage V -Baseline creatinine 4.1-4.5 -Serum creatinine peaked at 7.60 -Continue IV fluids -due to volume depletion -serum creatinine 4.25 on day of d/c -AV fistula placed 04/09/2017  Opioid dependence -Patient was on buprenorphine prior to admission, last filled 05/21/17--one week supply -The [facility] Controlled Substance Reporting System has been queried for this patient for the past 12 months -He will need to follow-up with his provider after discharge -patient did not receive any opioids during this admission  Diabetes mellitus type 1, uncontrolled -increase Lantus to 20 units at discharge -continue novolog sliding scale  Tobacco abuse -Tobacco cessation discussed  Chronic abdominal pain -secondary to his gastroparesis  Anemia of CKD -Check iron studies-iron  saturation 30%, ferritin 179 -Baseline hemoglobin9-10  Statement of Capacity -In speaking with Brent Daniels, he demonstrated the ability to understand the serious nature and consequences of leaving  the hospital prior to completion of an adequate course of treatment. He was able to articulate the consequences of leaving prior to completion of adequate therapy. He has consented to continued hospital treatment.        Discharge Instructions   Allergies as of 06/02/2017      Reactions   Hydrocodone Hives   Hydrocodone-acetaminophen Anaphylaxis   Tramadol Anaphylaxis, Other (See Comments)   Acid Reflux   Ibuprofen Other (See Comments), Nausea And Vomiting   Stomach ulcers   Morphine And Related Rash, Hives   Naproxen Other (See Comments)   Stomach ulcers   Sulfa Antibiotics Rash, Other (See Comments)   Stomach ulcers   Acetaminophen Nausea And Vomiting   Ketorolac Other (See Comments)   Unknown   Nsaids Nausea And Vomiting      Medication List    STOP taking these medications   cloNIDine 0.2 mg/24hr patch Commonly known as:  CATAPRES - Dosed in mg/24 hr Replaced by:  cloNIDine 0.3 mg/24hr patch   metoprolol succinate 25 MG 24 hr tablet Commonly known as:  TOPROL-XL   oxyCODONE 5 MG immediate release tablet Commonly known as:  ROXICODONE     TAKE these medications   amLODipine 10 MG tablet Commonly known as:  NORVASC Take 10 mg by mouth daily.   aspirin EC 81 MG tablet Take 81 mg by mouth daily.   Buprenorphine HCl-Naloxone HCl 8-2 MG Film Place 1 Film under the tongue 2 (two) times daily.   calcitRIOL 0.25 MCG capsule Commonly known as:  ROCALTROL Take 1 capsule (0.25 mcg total) by mouth daily. What changed:  Another medication with the same name was removed. Continue taking this medication, and follow the directions you see here.   calcium acetate 667 MG capsule Commonly known as:  PHOSLO Take 667 mg by mouth 3 (three) times daily with  meals.   carvedilol 25 MG tablet Commonly known as:  COREG Take 1 tablet (25 mg total) by mouth 2 (two) times daily with a meal.   cloNIDine 0.3 mg/24hr patch Commonly known as:  CATAPRES - Dosed in mg/24 hr Place 1 patch (0.3 mg total) onto the skin once a week. Change on Friday's Start taking on:  06/08/2017 Replaces:  cloNIDine 0.2 mg/24hr patch   FREESTYLE LIBRE 14 DAY READER Devi 1 each by Does not apply route every 14 (fourteen) days.   FREESTYLE LIBRE SENSOR SYSTEM Misc Use one sensor every 10 days.   FREESTYLE LIBRE 14 DAY SENSOR Misc 1 each by Does not apply route every 14 (fourteen) days.   gabapentin 300 MG capsule Commonly known as:  NEURONTIN Take 1 capsule (300 mg total) by mouth at bedtime. What changed:  when to take this   Insulin Glargine 100 UNIT/ML Solostar Pen Commonly known as:  LANTUS Inject 20 Units into the skin at bedtime. What changed:  how much to take   insulin regular 100 units/mL injection Commonly known as:  NOVOLIN R,HUMULIN R Inject 0-16 Units into the skin 3 (three) times daily before meals. Sliding Scale Insulin  Is BLOOD SUGAR READING - 100/30 (BLOOD SUGAR MINUS ONE HUNDRED DIVIDED BY THIRTY)   pantoprazole 40 MG tablet Commonly known as:  PROTONIX Take 1 tablet (40 mg total) by mouth 2 (two) times daily before a meal. What changed:  when to take this   tiZANidine 4 MG tablet Commonly known as:  ZANAFLEX Take 4 mg by mouth 2 (two) times daily.       Allergies  Allergen Reactions  . Hydrocodone Hives  . Hydrocodone-Acetaminophen Anaphylaxis  . Tramadol Anaphylaxis and Other (See Comments)    Acid Reflux  . Ibuprofen Other (See Comments) and Nausea And Vomiting    Stomach ulcers  . Morphine And Related Rash and Hives  . Naproxen Other (See Comments)    Stomach ulcers  . Sulfa Antibiotics Rash and Other (See Comments)    Stomach ulcers   . Acetaminophen Nausea And Vomiting  . Ketorolac Other (See Comments)    Unknown  .  Nsaids Nausea And Vomiting    Consultations:  none   Procedures/Studies: Ct Abdomen Pelvis Wo Contrast  Result Date: 05/29/2017 CLINICAL DATA:  No acute abnormality seen within the abdomen or pelvis. EXAM: CT ABDOMEN AND PELVIS WITHOUT CONTRAST TECHNIQUE: Multidetector CT imaging of the abdomen and pelvis was performed following the standard protocol without IV contrast. COMPARISON:  CT of the abdomen and pelvis performed 02/02/2017 FINDINGS: Lower chest: The visualized lung bases are grossly clear. The visualized portions of the mediastinum are unremarkable. Wall thickening at the distal esophagus likely reflects the patient's known erosive esophagitis. Hepatobiliary: The liver is unremarkable in appearance. The patient is status post cholecystectomy, with clips noted at the gallbladder fossa. The common bile duct remains normal in caliber. Pancreas: The pancreas is within normal limits. Spleen: The spleen is unremarkable in appearance. Adrenals/Urinary Tract: The adrenal glands are unremarkable in appearance. The kidneys are within normal limits. There is no evidence of hydronephrosis. No renal or ureteral stones are identified. No perinephric stranding is seen. Stomach/Bowel: The stomach is unremarkable in appearance. The small bowel is within normal limits. The appendix is not visualized; there is no evidence for appendicitis. The colon is unremarkable in appearance. Vascular/Lymphatic: Scattered calcification is seen along the abdominal aorta and its branches. The abdominal aorta is otherwise grossly unremarkable. The inferior vena cava is grossly unremarkable. No retroperitoneal lymphadenopathy is seen. No pelvic sidewall lymphadenopathy is identified. Reproductive: The bladder is mildly distended and grossly unremarkable. The prostate is normal in size. Other: No additional soft tissue abnormalities are seen. Musculoskeletal: No acute osseous abnormalities are identified. There is slight chronic  loss of height along the midthoracic spine. The visualized musculature is unremarkable in appearance. IMPRESSION: Wall thickening at the distal esophagus likely reflects the patient's known erosive esophagitis. Aortic Atherosclerosis (ICD10-I70.0). Electronically Signed   By: Garald Balding M.D.   On: 05/29/2017 02:50   Dg Abd 2 Views  Result Date: 05/30/2017 CLINICAL DATA:  Intractable vomiting. EXAM: ABDOMEN - 2 VIEW COMPARISON:  Radiographs of December 03, 2016. FINDINGS: The bowel gas pattern is normal. There is no evidence of free air. Status post cholecystectomy. No radio-opaque calculi or other significant radiographic abnormality is seen. IMPRESSION: No evidence of bowel obstruction or ileus. Electronically Signed   By: Marijo Conception, M.D.   On: 05/30/2017 12:38         Discharge Exam: Vitals:   06/02/17 0600 06/02/17 0700  BP:    Pulse: 71 69  Resp: 15 14  Temp:    SpO2: 98% 96%   Vitals:   06/02/17 0400 06/02/17 0500 06/02/17 0600 06/02/17 0700  BP: (!) 180/97     Pulse:  72 71 69  Resp: 15 12 15 14   Temp: 99.1 F (37.3 C)     TempSrc: Oral     SpO2:  97% 98% 96%  Weight: 83.1 kg (183 lb 3.2 oz)     Height:  General: Pt is alert, awake, not in acute distress Cardiovascular: RRR, S1/S2 +, no rubs, no gallops Respiratory: CTA bilaterally, no wheezing, no rhonchi Abdominal: Soft, NT, ND, bowel sounds + Extremities: no edema, no cyanosis   The results of significant diagnostics from this hospitalization (including imaging, microbiology, ancillary and laboratory) are listed below for reference.    Significant Diagnostic Studies: Ct Abdomen Pelvis Wo Contrast  Result Date: 05/29/2017 CLINICAL DATA:  No acute abnormality seen within the abdomen or pelvis. EXAM: CT ABDOMEN AND PELVIS WITHOUT CONTRAST TECHNIQUE: Multidetector CT imaging of the abdomen and pelvis was performed following the standard protocol without IV contrast. COMPARISON:  CT of the abdomen and  pelvis performed 02/02/2017 FINDINGS: Lower chest: The visualized lung bases are grossly clear. The visualized portions of the mediastinum are unremarkable. Wall thickening at the distal esophagus likely reflects the patient's known erosive esophagitis. Hepatobiliary: The liver is unremarkable in appearance. The patient is status post cholecystectomy, with clips noted at the gallbladder fossa. The common bile duct remains normal in caliber. Pancreas: The pancreas is within normal limits. Spleen: The spleen is unremarkable in appearance. Adrenals/Urinary Tract: The adrenal glands are unremarkable in appearance. The kidneys are within normal limits. There is no evidence of hydronephrosis. No renal or ureteral stones are identified. No perinephric stranding is seen. Stomach/Bowel: The stomach is unremarkable in appearance. The small bowel is within normal limits. The appendix is not visualized; there is no evidence for appendicitis. The colon is unremarkable in appearance. Vascular/Lymphatic: Scattered calcification is seen along the abdominal aorta and its branches. The abdominal aorta is otherwise grossly unremarkable. The inferior vena cava is grossly unremarkable. No retroperitoneal lymphadenopathy is seen. No pelvic sidewall lymphadenopathy is identified. Reproductive: The bladder is mildly distended and grossly unremarkable. The prostate is normal in size. Other: No additional soft tissue abnormalities are seen. Musculoskeletal: No acute osseous abnormalities are identified. There is slight chronic loss of height along the midthoracic spine. The visualized musculature is unremarkable in appearance. IMPRESSION: Wall thickening at the distal esophagus likely reflects the patient's known erosive esophagitis. Aortic Atherosclerosis (ICD10-I70.0). Electronically Signed   By: Garald Balding M.D.   On: 05/29/2017 02:50   Dg Abd 2 Views  Result Date: 05/30/2017 CLINICAL DATA:  Intractable vomiting. EXAM: ABDOMEN - 2  VIEW COMPARISON:  Radiographs of December 03, 2016. FINDINGS: The bowel gas pattern is normal. There is no evidence of free air. Status post cholecystectomy. No radio-opaque calculi or other significant radiographic abnormality is seen. IMPRESSION: No evidence of bowel obstruction or ileus. Electronically Signed   By: Marijo Conception, M.D.   On: 05/30/2017 12:38     Microbiology: Recent Results (from the past 240 hour(s))  MRSA PCR Screening     Status: None   Collection Time: 05/29/17  4:00 AM  Result Value Ref Range Status   MRSA by PCR NEGATIVE NEGATIVE Final    Comment:        The GeneXpert MRSA Assay (FDA approved for NASAL specimens only), is one component of a comprehensive MRSA colonization surveillance program. It is not intended to diagnose MRSA infection nor to guide or monitor treatment for MRSA infections. Performed at [facility], [address], [place], [place] [address]      Labs: Basic Metabolic Panel: Recent Labs  Lab 05/30/17 0306 05/30/17 0705 05/31/17 0422 06/01/17 0418 06/02/17 0403  NA 140 140 140 137 138  K 3.5 3.2* 4.0 3.8 4.3  CL 91* 90* 96* 97* 102  CO2  26 29 26 28 27   GLUCOSE 143* 135* 268* 174* 95  BUN 55* 54* 44* 40* 33*  CREATININE 6.34* 6.29* 5.14* 4.50* 4.25*  CALCIUM 9.9 9.7 9.6 9.5 9.3   Liver Function Tests: Recent Labs  Lab 05/30/17 0705 05/31/17 0422  AST 26 23  ALT 15* 19  ALKPHOS 129* 115  BILITOT 1.4* 1.6*  PROT 7.9 7.0  ALBUMIN 4.2 3.7   Recent Labs  Lab 05/30/17 0705  LIPASE 19   No results for input(s): AMMONIA in the last 168 hours. CBC: Recent Labs  Lab 05/28/17 2215 05/31/17 0422 06/01/17 0418 06/02/17 0403  WBC 24.4* 22.9* 13.4* 8.6  HGB 9.5* 10.0* 10.0* 9.3*  HCT 31.9* 31.7* 32.0* 29.4*  MCV 91.9 86.8 85.8 85.5  PLT 480* 300 246 199   Cardiac Enzymes: No results for input(s): CKTOTAL, CKMB, CKMBINDEX, TROPONINI in the last 168 hours. BNP: Invalid input(s): POCBNP CBG: Recent Labs  Lab  06/01/17 1618 06/01/17 2024 06/02/17 0008 06/02/17 0422 06/02/17 0729  GLUCAP 89 185* 115* 94 87    Time coordinating discharge:  36 minutes  Signed:  Orson Eva, DO [facility] Pager: [phone] 06/02/2017, 8:15 AM

## 2017-06-12 ENCOUNTER — Ambulatory Visit (INDEPENDENT_AMBULATORY_CARE_PROVIDER_SITE_OTHER): Payer: Managed Care, Other (non HMO) | Admitting: Vascular Surgery

## 2017-06-12 ENCOUNTER — Other Ambulatory Visit: Payer: Self-pay

## 2017-06-12 ENCOUNTER — Encounter: Payer: Self-pay | Admitting: Vascular Surgery

## 2017-06-12 VITALS — BP 178/93 | HR 83 | Temp 97.9°F | Resp 18 | Ht 74.0 in | Wt 198.0 lb

## 2017-06-12 DIAGNOSIS — N184 Chronic kidney disease, stage 4 (severe): Secondary | ICD-10-CM

## 2017-06-12 NOTE — Progress Notes (Signed)
Patient name: Brent Daniels MRN: [id number] DOB: 10-30-1985 Sex: male  REASON FOR VISIT: Follow-up AV fistula creation right brachiocephalic on 09/08/5051  HPI: Brent Daniels is a 32 y.o. male here today for follow-up and he is here with her mother.  She reports that he has had several seizures.  Current Outpatient Medications  Medication Sig Dispense Refill  . amLODipine (NORVASC) 10 MG tablet Take 10 mg by mouth daily.     . Buprenorphine HCl-Naloxone HCl 8-2 MG FILM Place 1 Film under the tongue 2 (two) times daily.    . calcitRIOL (ROCALTROL) 0.25 MCG capsule Take 1 capsule (0.25 mcg total) by mouth daily. 30 capsule 1  . calcium acetate (PHOSLO) 667 MG capsule Take 667 mg by mouth 3 (three) times daily with meals.  3  . carvedilol (COREG) 25 MG tablet Take 1 tablet (25 mg total) by mouth 2 (two) times daily with a meal. 60 tablet 1  . cloNIDine (CATAPRES - DOSED IN MG/24 HR) 0.3 mg/24hr patch Place 1 patch (0.3 mg total) onto the skin once a week. Change on Friday's 4 patch 1  . Continuous Blood Gluc Receiver (FREESTYLE LIBRE 14 DAY READER) DEVI 1 each by Does not apply route every 14 (fourteen) days. 1 Device 0  . Continuous Blood Gluc Sensor (FREESTYLE LIBRE 14 DAY SENSOR) MISC 1 each by Does not apply route every 14 (fourteen) days. 2 each 5  . Continuous Blood Gluc Sensor (FREESTYLE LIBRE SENSOR SYSTEM) MISC Use one sensor every 10 days. 3 each 2  . gabapentin (NEURONTIN) 300 MG capsule Take 1 capsule (300 mg total) by mouth at bedtime. (Patient taking differently: Take 300 mg by mouth 3 (three) times daily. )    . insulin aspart protamine- aspart (NOVOLOG MIX 70/30) (70-30) 100 UNIT/ML injection Inject into the skin.    . Insulin Glargine (LANTUS) 100 UNIT/ML Solostar Pen Inject 20 Units into the skin at bedtime. 15 mL 1  . insulin regular (NOVOLIN R,HUMULIN R) 100 units/mL injection Inject 0-16 Units into the skin 3 (three) times daily before  meals. Sliding Scale Insulin  Is BLOOD SUGAR READING - 100/30 (BLOOD SUGAR MINUS ONE HUNDRED DIVIDED BY THIRTY)    . pantoprazole (PROTONIX) 40 MG tablet Take 1 tablet (40 mg total) by mouth 2 (two) times daily before a meal. (Patient taking differently: Take 40 mg by mouth daily. ) 60 tablet 0  . tiZANidine (ZANAFLEX) 4 MG tablet Take 4 mg by mouth 2 (two) times daily.    Marland Kitchen aspirin EC 81 MG tablet Take 81 mg by mouth daily.    Marland Kitchen oxyCODONE-acetaminophen (PERCOCET) 10-325 MG tablet Take by mouth.     No current facility-administered medications for this visit.      PHYSICAL EXAM: Vitals:   06/12/17 0913 06/12/17 0915  BP: (!) 181/95 (!) 178/93  Pulse: 84 83  Resp: 18   Temp: 97.9 F (36.6 C)   TempSrc: Oral   SpO2: 98%   Weight: 198 lb (89.8 kg)   Height: 6\' 2"  (1.88 m)     GENERAL: The patient is a well-nourished male, in no acute distress. The vital signs are documented above. His right antecubital incision is healed.  He has excellent thrill throughout his cephalic vein fistula.  He has good size maturation and is very superficial and easy to see.  He has excellent thrill throughout the course.  MEDICAL ISSUES: Excellent maturation of his right brachiocephalic AV fistula now 9 weeks out.  I explained that it would be preferable to wait for 12 weeks total but if he needs hemodialysis, I feel that it is appropriate to use this fistula at any time.  He will see [place] again on an as-needed basis   Rosetta Posner, MD [facility] Tel [phone] Pager [phone]

## 2017-06-14 ENCOUNTER — Telehealth: Payer: Self-pay

## 2017-06-14 NOTE — Telephone Encounter (Signed)
[employer] called wanting a care plan for Brent Daniels. I advised her that Brent Daniels needs to contact office to schedule appt due to be very overdue for a follow up.

## 2017-09-12 ENCOUNTER — Encounter: Payer: Self-pay | Admitting: Gastroenterology

## 2017-09-18 NOTE — Progress Notes (Signed)
Patient ID: Brent Daniels, male   DOB: 1985/10/07, 32 y.o.   MRN: [id number]           Reason for Appointment : Consultation for Type 1 Diabetes  History of Present Illness          Diagnosis: Type 1 diabetes mellitus, date of diagnosis: At age 42         Previous history:   He has been on insulin and various regimens since diagnosis He was also on an insulin pump with [employer] for up to 7 or 8 years but this is over 10 years ago He has not seen an endocrinologist for probably 10 years or more He has generally been followed by his PCP No previous records are available and previous A1c results range from 7.6 up to 9.7 last year  Recent history:     INSULIN regimen is: Lantus 10 units at bedtime daily, regular insulin correction doses 1: 30 factor, carbohydrate coverage 1:8   His A1c today is 8.1, previously 7.6 in 2018  Current management, blood sugar patterns and problems identified:    He is taking regular insulin from [place] and is usually injecting this when he is starting to eat  He says he is covering his carbohydrates with a 1: 8 ratio and will add 1 unit per 30 mg for high sugars  Difficult to identify his blood sugar patterns as his meter could not be downloaded in his meter has the wrong date and time programmed   His recent blood sugars are mostly high with relatively high fasting readings and some high readings later in the day, not clear if he checks readings after evening meal also  Today he had a low blood sugar of 52 after taking a correction dose of 5 units for a blood sugar of 248 on waking up  He does not think he has frequent low blood sugars but may occur a couple of hours after eating sometimes and he thinks this is from gastroparesis  His food intake is variable because of problems with nausea  He has had a history of ketoacidosis admitted in 4/19 but also several admissions for gastroparesis   Glucose monitoring:  is being done 3-6 times a day          Glucometer:  [employer].      Blood Glucose readings from meter review  Blood sugar range recently 52-389 with 30-day average 247  His meter is programmed to the wrong date and time and unable to get a blood sugar pattern identified   Hypoglycemia:  occurs occasionally in the afternoon Factors causing hypoglycemia: Excessive amount of mealtime insulin Symptoms of hypoglycemia: Feeling hungry, shaky  Treatment of hypoglycemia: Usually juice         Self-care: The diet that the patient has been following is: Counting carbohydrates  For breakfast will usually have eggs and toast, occasionally will skip lunch, sometimes will have only soup for a meal                Dietician consultation: Most recent: Several years ago.    Wt Readings from Last 3 Encounters:  09/19/17 204 lb (92.5 kg)  06/12/17 198 lb (89.8 kg)  06/02/17 183 lb 3.2 oz (83.1 kg)          Diabetes labs:  Lab Results  Component Value Date   HGBA1C 8.1 (A) 09/19/2017   HGBA1C 7.6 (H) 11/16/2016   HGBA1C 9.7 (H) 08/24/2016   Lab Results  Component Value Date   LDLCALC (H) 07/06/2009    110        Total Cholesterol/HDL:CHD Risk Coronary Heart Disease Risk Table                     Men   Women  1/2 Average Risk   3.4   3.3  Average Risk       5.0   4.4  2 X Average Risk   9.6   7.1  3 X Average Risk  23.4   11.0        Use the calculated Patient Ratio above and the CHD Risk Table to determine the patient's CHD Risk.        ATP III CLASSIFICATION (LDL):  <100     mg/dL   Optimal  100-129  mg/dL   Near or Above                    Optimal  130-159  mg/dL   Borderline  160-189  mg/dL   High  >190     mg/dL   Very High   CREATININE 4.25 (H) 06/02/2017    No results found for: MICRALBCREAT   Allergies as of 09/19/2017      Reactions   Hydrocodone Hives   Hydrocodone-acetaminophen Anaphylaxis   Nsaids Nausea And Vomiting   Tramadol Anaphylaxis, Other (See Comments), Nausea Only   Acid  Reflux Acid Reflux   Ibuprofen Other (See Comments), Nausea And Vomiting   Stomach ulcers   Morphine And Related Rash, Hives   Naproxen Other (See Comments)   Stomach ulcers Stomach ulcers   Sulfa Antibiotics Rash, Other (See Comments)   Stomach ulcers   Sulfamethoxazole Other (See Comments), Rash   Acetaminophen Nausea And Vomiting   Ketorolac Other (See Comments)   Unknown      Medication List        Accurate as of 09/19/17  9:09 PM. Always use your most recent med list.          amLODipine 10 MG tablet Commonly known as:  NORVASC Take 10 mg by mouth daily.   aspirin EC 81 MG tablet Take 81 mg by mouth daily.   BAYER MICROLET LANCETS lancets Use as instructed to test sugar 5 times daily   Buprenorphine HCl-Naloxone HCl 8-2 MG Film Place 1 Film under the tongue 2 (two) times daily.   calcitRIOL 0.25 MCG capsule Commonly known as:  ROCALTROL Take 1 capsule (0.25 mcg total) by mouth daily.   calcitRIOL 0.5 MCG capsule Commonly known as:  ROCALTROL Take 1 mcg by mouth daily.   calcium acetate 667 MG capsule Commonly known as:  PHOSLO Take 667 mg by mouth 3 (three) times daily with meals.   carvedilol 25 MG tablet Commonly known as:  COREG Take 1 tablet (25 mg total) by mouth 2 (two) times daily with a meal.   cloNIDine 0.3 mg/24hr patch Commonly known as:  CATAPRES - Dosed in mg/24 hr Place 1 patch (0.3 mg total) onto the skin once a week. Change on Friday's   FREESTYLE LIBRE 14 DAY READER Devi 1 each by Does not apply route every 14 (fourteen) days.   FREESTYLE LIBRE SENSOR SYSTEM Misc Use one sensor every 10 days.   FREESTYLE LIBRE 14 DAY SENSOR Misc 1 each by Does not apply route every 14 (fourteen) days.   furosemide 40 MG tablet Commonly known as:  LASIX Take 40 mg by mouth daily as  needed.   gabapentin 300 MG capsule Commonly known as:  NEURONTIN Take 1 capsule (300 mg total) by mouth at bedtime.   glucose blood test strip Use as  instructed to test sugar 5 times daily   insulin aspart 100 UNIT/ML FlexPen Commonly known as:  NOVOLOG 10 units ac tid   Insulin Glargine 100 UNIT/ML Solostar Pen Commonly known as:  LANTUS Inject 20 Units into the skin at bedtime.   insulin lispro 100 UNIT/ML KiwkPen Commonly known as:  HUMALOG 10 units ac tid   insulin regular 100 units/mL injection Commonly known as:  NOVOLIN R,HUMULIN R Inject 0-16 Units into the skin 3 (three) times daily before meals. Sliding Scale Insulin  Is BLOOD SUGAR READING - 100/30 (BLOOD SUGAR MINUS ONE HUNDRED DIVIDED BY THIRTY)   loratadine 10 MG tablet Commonly known as:  CLARITIN Take 10 mg by mouth daily.   metoCLOPramide 5 MG tablet Commonly known as:  REGLAN Take 5 mg by mouth 4 (four) times daily as needed.   pantoprazole 40 MG tablet Commonly known as:  PROTONIX Take 1 tablet (40 mg total) by mouth 2 (two) times daily before a meal.   promethazine 25 MG tablet Commonly known as:  PHENERGAN Take 25 mg by mouth 3 (three) times daily as needed.   tiZANidine 4 MG tablet Commonly known as:  ZANAFLEX Take 4 mg by mouth 2 (two) times daily.       Allergies:  Allergies  Allergen Reactions  . Hydrocodone Hives  . Hydrocodone-Acetaminophen Anaphylaxis  . Nsaids Nausea And Vomiting  . Tramadol Anaphylaxis, Other (See Comments) and Nausea Only    Acid Reflux Acid Reflux  . Ibuprofen Other (See Comments) and Nausea And Vomiting    Stomach ulcers  . Morphine And Related Rash and Hives  . Naproxen Other (See Comments)    Stomach ulcers Stomach ulcers  . Sulfa Antibiotics Rash and Other (See Comments)    Stomach ulcers   . Sulfamethoxazole Other (See Comments) and Rash  . Acetaminophen Nausea And Vomiting  . Ketorolac Other (See Comments)    Unknown    Past Medical History:  Diagnosis Date  . Acute esophagitis   . Anemia   . Anxiety   . Arthritis   . Bicuspid aortic valve    noted on 10/20/16 [facility])  . Chronic  abdominal pain   . Chronic back pain   . Chronic kidney disease    Stage 5 Kidney disease  . Depression   . Diabetes mellitus    type 1  . Diabetic gastroparesis associated with type 1 diabetes mellitus ([place])   . Erosive esophagitis 12/23/2013   "severe" per EGD   . Gastroparesis   . GERD (gastroesophageal reflux disease)   . Headache    in the past  . Heart murmur    mild TR, mild MR, mild AS 02/19/17  . History of hiatal hernia   . Hypercholesteremia   . Hypertension   . Insomnia   . Nausea and vomiting    chronic, recurrent  . Neuropathy   . Pneumonia   . PUD (peptic ulcer disease)   . S/P arthroscopic knee surgery 07/04/2011  . Scoliosis 07/11/2012    Past Surgical History:  Procedure Laterality Date  . AV FISTULA PLACEMENT Right 04/09/2017   Procedure: ARTERIOVENOUS (AV) FISTULA CREATION RIGHT ARM;  Surgeon: Rosetta Posner, MD;  Location: [facility];  Service: Vascular;  Laterality: Right;  . BIOPSY  06/17/2013   Procedure: GASTRIC  ULCER AND ANTRAL BIOPSIES; ESOPHAGEAL BRUSHING;  Surgeon: Danie Binder, MD;  Location: [facility];  Service: Endoscopy;;  . BIOPSY N/A 12/23/2013   Procedure: ESOPHAGEAL BIOPSY;  Surgeon: Daneil Dolin, MD;  Location: [facility];  Service: Endoscopy;  Laterality: N/A;  . CHOLECYSTECTOMY N/A 01/28/2014   Procedure: LAPAROSCOPIC CHOLECYSTECTOMY;  Surgeon: Jamesetta So, MD;  Location: [facility];  Service: General;  Laterality: N/A;  . ESOPHAGOGASTRODUODENOSCOPY (EGD) WITH PROPOFOL  06/17/2013   Dr. Oneida Alar: two gastric ulcers in fundus, mild antral gastritis, candida esophagitis  . ESOPHAGOGASTRODUODENOSCOPY (EGD) WITH PROPOFOL N/A 09/11/2013   Dr. [name]: abnormal hypopharynx, question massively enlarged tonsils, stomach full of food precluded exam, needs EGD for verification of ulcer healing at a later date  . ESOPHAGOGASTRODUODENOSCOPY (EGD) WITH PROPOFOL N/A 12/23/2013   RMR: Severe exudative esophagitis likely predominatly reflux related. Superimposed Candida  infection not excluded status post KOH brushing and biopsy. Localized excoriating gastric mucosa most consistant with trauma(vomiting). No evidence of peptic ulcer disease or other gastric/duodenal pathology. I suspect severe inflammation involving the distal esophagus may account  for at least some of patii  . KNEE ARTHROSCOPY  06/30/2011   Procedure: ARTHROSCOPY KNEE;  Surgeon: Carole Civil, MD;  Location: [facility];  Service: Orthopedics;  Laterality: Right;  diagnostic arthroscopy  . TYMPANOSTOMY TUBE PLACEMENT      Family History  Problem Relation Age of Onset  . Cancer Father   . Alcohol abuse Father   . Diabetes Father   . Diabetes Paternal Grandfather   . Pseudochol deficiency Neg Hx   . Malignant hyperthermia Neg Hx   . Hypotension Neg Hx   . Anesthesia problems Neg Hx   . Colon cancer Neg Hx     Social History:  reports that he has been smoking cigarettes. He has a 2.50 pack-year smoking history. He quit smokeless tobacco use about 4 years ago. He reports that he does not drink alcohol or use drugs.      Review of Systems  Constitutional: Positive for weight gain.  HENT: Negative for headaches.   Eyes: Negative for blurred vision.  Respiratory: Negative for shortness of breath.   Cardiovascular: Positive for leg swelling.       Treated with Lasix  Gastrointestinal: Positive for nausea and vomiting.  Endocrine: Positive for fatigue.  Genitourinary: Negative for frequency.  Musculoskeletal: Negative for joint pain.  Skin: Negative for dry skin.  Neurological: Positive for numbness.       He has some numbness in his feet but no pains  Psychiatric/Behavioral:       His mother says that he is not motivated and does not feel good all the time        Lipids: No results available  He has not had an eye exam for several years  He has been on antihypertensives from his nephrologist, using clonidine and Norvasc  For his CKD he is followed up by his nephrologist,  currently not on dialysis  LABS:    Physical Examination:  BP (!) 150/102   Pulse 82   Ht 6\' 2"  (1.88 m)   Wt 204 lb (92.5 kg)   SpO2 96%   BMI 26.19 kg/m   GENERAL:  Averagely built and nourished  HEENT:         Eye exam shows normal external appearance. Fundus exam shows no retinopathy. Oral exam shows normal mucosa .  NECK:         there is no lymphadenopathy.  Thyroid is not enlarged and no nodules felt.    LUNGS:         Chest is symmetrical. Lungs are clear to auscultation.Marland Kitchen   HEART:         Heart sounds:  S1 and S2 are normal. No murmurs or clicks heard., no S3 or S4.   ABDOMEN:  no distention present. Liver and spleen are not palpable. No other mass or tenderness present.  EXTREMITIES:     There is no edema. No skin lesions present but he has some thickening of of the skin around his feet and lower legs   NEUROLOGICAL:        Vibration sense is absent in toes.  Ankle jerks are absent bilaterally.       Diabetic Foot Exam - Simple   Simple Foot Form Diabetic Foot exam was performed with the following findings:  Yes 09/19/2017  3:15 PM  Visual Inspection No deformities, no ulcerations, no other skin breakdown bilaterally:  Yes See comments:  Yes Sensation Testing See comments:  Yes Pulse Check Posterior Tibialis and Dorsalis pulse intact bilaterally:  Yes Comments Thickening of the skin on the lower legs and feet present Monofilament sensation decreased on the left big toe and on the plantar surfaces distally         MUSCULOSKELETAL:       There is no enlargement or deformity of the joints.  SKIN:       No rash, lesions or abnormal pigmentation       ASSESSMENT:  Diabetes type 1, long-standing with multiple complications and persistently poor control and history of ketoacidosis periodically  Recent A1c 8.1%  Problems identified:  Mostly high blood sugars as reviewed by his monitor but since his meter cannot be downloaded difficult to know what his blood  sugar patterns are  Recent blood sugar average at home 247 for the last 30 days without frequent hypoglycemia  Most likely he is not getting 24 coverage with his once a day Lantus at night and also may not be getting adequate dose with higher fasting readings most of the time  Although he reports he is doing carbohydrate counting for all his meals not clear if he does not accurately  Also because of his gastroparesis his food intake is variable and inconsistent making mealtime coverage with insulin difficult  Currently taking slow acting regular insulin which may be occasionally causing late hypoglycemia  Has variability in blood sugars at home because of using regular insulin, gastroparesis, inconsistent schedule and food intake and renal failure  Complications: Nephropathy with advanced renal failure, peripheral neuropathy with sensory loss, likely has some retinopathy but no recent exams available.  Also has reportedly gastroparesis without adequate control of symptoms  Hypertension managed by her nephrologist, blood pressure relatively high today  Probable depression  PLAN:    Split Lantus to twice a day  He will use 6 units twice a day and given him titration instructions to go up 1 unit every 3 to 4 days to keep before breakfast and suppertime readings at least under 150  Since he needs some more rapidly acting insulin he will switch Novolin R to NovoLog and he may be able to take this right after eating also.  Discussed differences between NovoLog and regular insulin  To change correction factor to 1: 40 instead of 30 to avoid overcorrection  Consider changing Lantus to [place] once he has finished his supply  Given new contour next glucose monitor and he  will start checking blood sugar with this and will bring this for download on each visit  Need day-to-day management reviewed with diabetes educator  Given information on [employer] 670 pump when he can see if this can be  affordable for him  Otherwise may consider at least Dexcom sensor if affordable  Because of his significant symptoms he should continue taking metoclopramide but can reduce it to twice a day and skip the dose when he is not eating a significant meal; he needs to check with his PCP about safety as literature does not indicate a high incidence of arrhythmias as indicated to him by other physicians.  Also needs to take this 30-minute before eating  Needs to schedule eye exam which is overdue  Patient Instructions  LANTUS insulin  Take 6 units twice a day about 12 hours apart In the morning sugars are consistently over 150 go up progressively by 1 unit every 4 to 5 days  If the suppertime readings are consistently over 150 increase the morning dose by 1 unit  NOVOLOG insulin: This will be faster acting and to take this before each meal based as follows  Cover carbohydrates with 1 unit for 8 g of carbohydrate content For high blood sugars take an extra unit per 40 mg and not 30 mg over 100  Check insurance coverage for [employer] 670 pump which has a guardian continuous glucose sensor  Continue taking metoclopramide at least 2 times a day before meals  Counseling time on subjects discussed in assessment and plan sections is over 50% of today's 60 minute visit   Elayne Snare 09/19/2017, 9:09 PM   Note: This note was prepared with Dragon voice recognition system technology. Any transcriptional errors that result from this process are unintentional.

## 2017-09-19 ENCOUNTER — Telehealth: Payer: Self-pay | Admitting: Emergency Medicine

## 2017-09-19 ENCOUNTER — Encounter: Payer: Self-pay | Admitting: Endocrinology

## 2017-09-19 ENCOUNTER — Ambulatory Visit (INDEPENDENT_AMBULATORY_CARE_PROVIDER_SITE_OTHER): Payer: Managed Care, Other (non HMO) | Admitting: Endocrinology

## 2017-09-19 VITALS — BP 150/102 | HR 82 | Ht 74.0 in | Wt 204.0 lb

## 2017-09-19 DIAGNOSIS — K3184 Gastroparesis: Secondary | ICD-10-CM

## 2017-09-19 DIAGNOSIS — E1065 Type 1 diabetes mellitus with hyperglycemia: Secondary | ICD-10-CM

## 2017-09-19 DIAGNOSIS — E1043 Type 1 diabetes mellitus with diabetic autonomic (poly)neuropathy: Secondary | ICD-10-CM | POA: Diagnosis not present

## 2017-09-19 LAB — POCT GLYCOSYLATED HEMOGLOBIN (HGB A1C): HEMOGLOBIN A1C: 8.1 % — AB (ref 4.0–5.6)

## 2017-09-19 MED ORDER — INSULIN LISPRO 100 UNIT/ML (KWIKPEN): PEN_INJECTOR | SUBCUTANEOUS | 1 refills | Status: DC

## 2017-09-19 MED ORDER — GLUCOSE BLOOD VI STRP: ORAL_STRIP | 0 refills | Status: DC

## 2017-09-19 MED ORDER — BAYER MICROLET LANCETS MISC: 0 refills | Status: DC

## 2017-09-19 MED ORDER — INSULIN ASPART 100 UNIT/ML FLEXPEN: PEN_INJECTOR | SUBCUTANEOUS | 0 refills | Status: DC

## 2017-09-19 NOTE — Telephone Encounter (Signed)
[facility] is calling to see if patient can take Humalog instead of Novolog due to insurance not covering Novolog. Please give them a call back thanks.

## 2017-09-19 NOTE — Patient Instructions (Addendum)
LANTUS insulin  Take 6 units twice a day about 12 hours apart In the morning sugars are consistently over 150 go up progressively by 1 unit every 4 to 5 days  If the suppertime readings are consistently over 150 increase the morning dose by 1 unit  NOVOLOG insulin: This will be faster acting and to take this before each meal based as follows  Cover carbohydrates with 1 unit for 8 g of carbohydrate content For high blood sugars take an extra unit per 40 mg and not 30 mg over 100  Check insurance coverage for [employer]onic 670 pump which has a guardian continuous glucose sensor  Continue taking metoclopramide at least 2 times a day before meals

## 2017-09-19 NOTE — Telephone Encounter (Signed)
Sent in the Humalog

## 2017-10-02 ENCOUNTER — Other Ambulatory Visit: Payer: Self-pay

## 2017-10-02 ENCOUNTER — Telehealth: Payer: Self-pay | Admitting: Endocrinology

## 2017-10-02 MED ORDER — BAYER MICROLET LANCETS MISC: 0 refills | Status: DC

## 2017-10-02 NOTE — Telephone Encounter (Signed)
This has been filled 

## 2017-10-02 NOTE — Telephone Encounter (Signed)
BAYER MICROLET LANCETS lancets  Patient need his pen needles sent in also to the [facility], [place], [place]

## 2017-10-09 ENCOUNTER — Telehealth: Payer: Self-pay | Admitting: Endocrinology

## 2017-10-09 ENCOUNTER — Other Ambulatory Visit: Payer: Self-pay | Admitting: Emergency Medicine

## 2017-10-09 MED ORDER — INSULIN GLARGINE 100 UNIT/ML SOLOSTAR PEN: 6.0000 [IU] | PEN_INJECTOR | Freq: Two times a day (BID) | SUBCUTANEOUS | 3 refills | Status: DC

## 2017-10-09 NOTE — Telephone Encounter (Signed)
Called and spoke to patient he was requesting lantus. Sent to pharmacy and sent to provider to sign off. Nothing further needed at this time.

## 2017-10-09 NOTE — Telephone Encounter (Signed)
Pt called back and stated he is not sure why they are not due to be picked up because he has never picked them up before. Offered to check with CMA but patient stated he was too busy to talk right now and would have to call back.

## 2017-10-09 NOTE — Telephone Encounter (Signed)
Patient stated that the pharmacy has not received prescription for his Lancets or pen needles and would like this resent      [facility] [place], [place], [place]

## 2017-10-09 NOTE — Telephone Encounter (Signed)
Called and left Vm to pt to return call to office. Pin needles and lancets are not due to pick up!

## 2017-10-15 ENCOUNTER — Telehealth: Payer: Self-pay | Admitting: Endocrinology

## 2017-10-15 NOTE — Telephone Encounter (Signed)
Patient is requesting a call back from the office,. He has went to the pharmacy to pick up prescriptions and is saying that the lancets and Pen needles are still not there  Please advise

## 2017-10-16 ENCOUNTER — Other Ambulatory Visit: Payer: Self-pay

## 2017-10-16 ENCOUNTER — Encounter (HOSPITAL_COMMUNITY): Payer: Self-pay | Admitting: Emergency Medicine

## 2017-10-16 ENCOUNTER — Inpatient Hospital Stay (HOSPITAL_COMMUNITY)
Admission: EM | Admit: 2017-10-16 | Discharge: 2017-10-29 | DRG: 853 | Disposition: A | Discharge status: Home Health | Payer: Managed Care, Other (non HMO) | Attending: Internal Medicine | Admitting: Internal Medicine

## 2017-10-16 ENCOUNTER — Emergency Department (HOSPITAL_COMMUNITY): Payer: Managed Care, Other (non HMO)

## 2017-10-16 DIAGNOSIS — Q231 Congenital insufficiency of aortic valve: Secondary | ICD-10-CM | POA: Diagnosis not present

## 2017-10-16 DIAGNOSIS — Z833 Family history of diabetes mellitus: Secondary | ICD-10-CM

## 2017-10-16 DIAGNOSIS — E871 Hypo-osmolality and hyponatremia: Secondary | ICD-10-CM | POA: Diagnosis present

## 2017-10-16 DIAGNOSIS — F1120 Opioid dependence, uncomplicated: Secondary | ICD-10-CM | POA: Diagnosis present

## 2017-10-16 DIAGNOSIS — I700 Atherosclerosis of aorta: Secondary | ICD-10-CM | POA: Diagnosis present

## 2017-10-16 DIAGNOSIS — B952 Enterococcus as the cause of diseases classified elsewhere: Secondary | ICD-10-CM | POA: Diagnosis present

## 2017-10-16 DIAGNOSIS — F17210 Nicotine dependence, cigarettes, uncomplicated: Secondary | ICD-10-CM | POA: Diagnosis present

## 2017-10-16 DIAGNOSIS — E1143 Type 2 diabetes mellitus with diabetic autonomic (poly)neuropathy: Secondary | ICD-10-CM | POA: Diagnosis present

## 2017-10-16 DIAGNOSIS — N433 Hydrocele, unspecified: Secondary | ICD-10-CM | POA: Diagnosis present

## 2017-10-16 DIAGNOSIS — N170 Acute kidney failure with tubular necrosis: Secondary | ICD-10-CM | POA: Diagnosis present

## 2017-10-16 DIAGNOSIS — Z888 Allergy status to other drugs, medicaments and biological substances status: Secondary | ICD-10-CM

## 2017-10-16 DIAGNOSIS — L0390 Cellulitis, unspecified: Secondary | ICD-10-CM

## 2017-10-16 DIAGNOSIS — I120 Hypertensive chronic kidney disease with stage 5 chronic kidney disease or end stage renal disease: Secondary | ICD-10-CM | POA: Diagnosis present

## 2017-10-16 DIAGNOSIS — E1022 Type 1 diabetes mellitus with diabetic chronic kidney disease: Secondary | ICD-10-CM | POA: Diagnosis present

## 2017-10-16 DIAGNOSIS — R112 Nausea with vomiting, unspecified: Secondary | ICD-10-CM | POA: Diagnosis not present

## 2017-10-16 DIAGNOSIS — N178 Other acute kidney failure: Secondary | ICD-10-CM | POA: Diagnosis not present

## 2017-10-16 DIAGNOSIS — E869 Volume depletion, unspecified: Secondary | ICD-10-CM | POA: Diagnosis present

## 2017-10-16 DIAGNOSIS — E1110 Type 2 diabetes mellitus with ketoacidosis without coma: Secondary | ICD-10-CM

## 2017-10-16 DIAGNOSIS — E0810 Diabetes mellitus due to underlying condition with ketoacidosis without coma: Secondary | ICD-10-CM | POA: Diagnosis not present

## 2017-10-16 DIAGNOSIS — E1010 Type 1 diabetes mellitus with ketoacidosis without coma: Secondary | ICD-10-CM | POA: Diagnosis not present

## 2017-10-16 DIAGNOSIS — E7800 Pure hypercholesterolemia, unspecified: Secondary | ICD-10-CM | POA: Diagnosis present

## 2017-10-16 DIAGNOSIS — E1043 Type 1 diabetes mellitus with diabetic autonomic (poly)neuropathy: Secondary | ICD-10-CM | POA: Diagnosis present

## 2017-10-16 DIAGNOSIS — Z87892 Personal history of anaphylaxis: Secondary | ICD-10-CM

## 2017-10-16 DIAGNOSIS — R000 Tachycardia, unspecified: Secondary | ICD-10-CM | POA: Diagnosis not present

## 2017-10-16 DIAGNOSIS — M898X9 Other specified disorders of bone, unspecified site: Secondary | ICD-10-CM | POA: Diagnosis present

## 2017-10-16 DIAGNOSIS — A419 Sepsis, unspecified organism: Principal | ICD-10-CM | POA: Diagnosis present

## 2017-10-16 DIAGNOSIS — M1990 Unspecified osteoarthritis, unspecified site: Secondary | ICD-10-CM | POA: Diagnosis present

## 2017-10-16 DIAGNOSIS — N492 Inflammatory disorders of scrotum: Secondary | ICD-10-CM | POA: Diagnosis present

## 2017-10-16 DIAGNOSIS — K219 Gastro-esophageal reflux disease without esophagitis: Secondary | ICD-10-CM | POA: Diagnosis present

## 2017-10-16 DIAGNOSIS — Z882 Allergy status to sulfonamides status: Secondary | ICD-10-CM

## 2017-10-16 DIAGNOSIS — Z885 Allergy status to narcotic agent status: Secondary | ICD-10-CM

## 2017-10-16 DIAGNOSIS — N2581 Secondary hyperparathyroidism of renal origin: Secondary | ICD-10-CM | POA: Diagnosis present

## 2017-10-16 DIAGNOSIS — R630 Anorexia: Secondary | ICD-10-CM | POA: Diagnosis not present

## 2017-10-16 DIAGNOSIS — N184 Chronic kidney disease, stage 4 (severe): Secondary | ICD-10-CM | POA: Diagnosis not present

## 2017-10-16 DIAGNOSIS — K208 Other esophagitis: Secondary | ICD-10-CM | POA: Diagnosis present

## 2017-10-16 DIAGNOSIS — I10 Essential (primary) hypertension: Secondary | ICD-10-CM | POA: Diagnosis not present

## 2017-10-16 DIAGNOSIS — G43A0 Cyclical vomiting, not intractable: Secondary | ICD-10-CM

## 2017-10-16 DIAGNOSIS — E1065 Type 1 diabetes mellitus with hyperglycemia: Secondary | ICD-10-CM | POA: Diagnosis not present

## 2017-10-16 DIAGNOSIS — G8929 Other chronic pain: Secondary | ICD-10-CM | POA: Diagnosis present

## 2017-10-16 DIAGNOSIS — N493 Fournier gangrene: Secondary | ICD-10-CM | POA: Diagnosis not present

## 2017-10-16 DIAGNOSIS — N185 Chronic kidney disease, stage 5: Secondary | ICD-10-CM | POA: Diagnosis present

## 2017-10-16 DIAGNOSIS — D631 Anemia in chronic kidney disease: Secondary | ICD-10-CM | POA: Diagnosis present

## 2017-10-16 DIAGNOSIS — Z8711 Personal history of peptic ulcer disease: Secondary | ICD-10-CM

## 2017-10-16 DIAGNOSIS — F17200 Nicotine dependence, unspecified, uncomplicated: Secondary | ICD-10-CM | POA: Diagnosis not present

## 2017-10-16 DIAGNOSIS — N451 Epididymitis: Secondary | ICD-10-CM | POA: Diagnosis present

## 2017-10-16 DIAGNOSIS — K3184 Gastroparesis: Secondary | ICD-10-CM | POA: Diagnosis present

## 2017-10-16 DIAGNOSIS — N181 Chronic kidney disease, stage 1: Secondary | ICD-10-CM | POA: Diagnosis not present

## 2017-10-16 DIAGNOSIS — Z794 Long term (current) use of insulin: Secondary | ICD-10-CM

## 2017-10-16 DIAGNOSIS — Z9049 Acquired absence of other specified parts of digestive tract: Secondary | ICD-10-CM

## 2017-10-16 DIAGNOSIS — N189 Chronic kidney disease, unspecified: Secondary | ICD-10-CM

## 2017-10-16 DIAGNOSIS — Z8701 Personal history of pneumonia (recurrent): Secondary | ICD-10-CM

## 2017-10-16 DIAGNOSIS — M419 Scoliosis, unspecified: Secondary | ICD-10-CM | POA: Diagnosis present

## 2017-10-16 DIAGNOSIS — Z79899 Other long term (current) drug therapy: Secondary | ICD-10-CM

## 2017-10-16 DIAGNOSIS — N179 Acute kidney failure, unspecified: Secondary | ICD-10-CM | POA: Diagnosis present

## 2017-10-16 LAB — CBC WITH DIFFERENTIAL/PLATELET
Basophils Absolute: 0 10*3/uL (ref 0.0–0.1)
Basophils Relative: 0 %
EOS ABS: 0 10*3/uL (ref 0.0–0.7)
Eosinophils Relative: 0 %
HCT: 25.8 % — ABNORMAL LOW (ref 39.0–52.0)
HEMOGLOBIN: 8.5 g/dL — AB (ref 13.0–17.0)
LYMPHS PCT: 4 %
Lymphs Abs: 1.1 10*3/uL (ref 0.7–4.0)
MCH: 29.6 pg (ref 26.0–34.0)
MCHC: 32.9 g/dL (ref 30.0–36.0)
MCV: 89.9 fL (ref 78.0–100.0)
Monocytes Absolute: 2 10*3/uL — ABNORMAL HIGH (ref 0.1–1.0)
Monocytes Relative: 7 %
NEUTROS ABS: 25.4 10*3/uL — AB (ref 1.7–7.7)
Neutrophils Relative %: 89 %
Platelets: 350 10*3/uL (ref 150–400)
RBC: 2.87 MIL/uL — ABNORMAL LOW (ref 4.22–5.81)
RDW: 15.4 % (ref 11.5–15.5)
WBC: 28.5 10*3/uL — ABNORMAL HIGH (ref 4.0–10.5)

## 2017-10-16 LAB — COMPREHENSIVE METABOLIC PANEL
ALK PHOS: 88 U/L (ref 38–126)
ALT: 7 U/L (ref 0–44)
ANION GAP: 15 (ref 5–15)
AST: 18 U/L (ref 15–41)
Albumin: 3.7 g/dL (ref 3.5–5.0)
BILIRUBIN TOTAL: 0.8 mg/dL (ref 0.3–1.2)
BUN: 53 mg/dL — ABNORMAL HIGH (ref 6–20)
CO2: 23 mmol/L (ref 22–32)
Calcium: 9.3 mg/dL (ref 8.9–10.3)
Chloride: 89 mmol/L — ABNORMAL LOW (ref 98–111)
Creatinine, Ser: 7.79 mg/dL — ABNORMAL HIGH (ref 0.61–1.24)
GFR calc Af Amer: 9 mL/min — ABNORMAL LOW (ref >60)
GFR calc non Af Amer: 8 mL/min — ABNORMAL LOW (ref >60)
GLUCOSE: 333 mg/dL — AB (ref 70–99)
Potassium: 4.7 mmol/L (ref 3.5–5.1)
Sodium: 127 mmol/L — ABNORMAL LOW (ref 135–145)
TOTAL PROTEIN: 7.8 g/dL (ref 6.5–8.1)

## 2017-10-16 LAB — BASIC METABOLIC PANEL
Anion gap: 26 — ABNORMAL HIGH (ref 5–15)
BUN: 60 mg/dL — ABNORMAL HIGH (ref 6–20)
CALCIUM: 8.7 mg/dL — AB (ref 8.9–10.3)
CO2: 13 mmol/L — AB (ref 22–32)
Chloride: 84 mmol/L — ABNORMAL LOW (ref 98–111)
Creatinine, Ser: 8.01 mg/dL — ABNORMAL HIGH (ref 0.61–1.24)
GFR calc Af Amer: 9 mL/min — ABNORMAL LOW (ref >60)
GFR, EST NON AFRICAN AMERICAN: 8 mL/min — AB (ref >60)
GLUCOSE: 832 mg/dL — AB (ref 70–99)
Potassium: 4.9 mmol/L (ref 3.5–5.1)
Sodium: 123 mmol/L — ABNORMAL LOW (ref 135–145)

## 2017-10-16 LAB — GLUCOSE, CAPILLARY: Glucose-Capillary: >600 mg/dL (ref 70–99)

## 2017-10-16 LAB — GLUCOSE, RANDOM: GLUCOSE: 796 mg/dL — AB (ref 70–99)

## 2017-10-16 LAB — I-STAT CG4 LACTIC ACID, ED: Lactic Acid, Venous: 1.58 mmol/L (ref 0.5–1.9)

## 2017-10-16 LAB — CBG MONITORING, ED: GLUCOSE-CAPILLARY: 313 mg/dL — AB (ref 70–99)

## 2017-10-16 MED ORDER — SODIUM CHLORIDE 0.9 % IV SOLN
INTRAVENOUS | Status: DC
  Administered 2017-10-16: 19:00:00 via INTRAVENOUS

## 2017-10-16 MED ORDER — VANCOMYCIN HCL IN DEXTROSE 1-5 GM/200ML-% IV SOLN
1000.0000 mg | INTRAVENOUS | Status: DC
  Administered 2017-10-18 – 2017-10-22 (×3): 1000 mg via INTRAVENOUS
  Filled 2017-10-16 (×3): qty 200

## 2017-10-16 MED ORDER — HEPARIN SODIUM (PORCINE) 5000 UNIT/ML IJ SOLN
5000.0000 [IU] | Freq: Three times a day (TID) | INTRAMUSCULAR | Status: DC
  Administered 2017-10-17 – 2017-10-25 (×7): 5000 [IU] via SUBCUTANEOUS
  Filled 2017-10-16 (×18): qty 1

## 2017-10-16 MED ORDER — ONDANSETRON HCL 4 MG/2ML IJ SOLN
4.0000 mg | Freq: Four times a day (QID) | INTRAMUSCULAR | Status: DC | PRN
  Administered 2017-10-17 – 2017-10-28 (×7): 4 mg via INTRAVENOUS
  Filled 2017-10-16 (×8): qty 2

## 2017-10-16 MED ORDER — INSULIN ASPART 100 UNIT/ML ~~LOC~~ SOLN: 0.0000 [IU] | Freq: Three times a day (TID) | SUBCUTANEOUS | Status: DC

## 2017-10-16 MED ORDER — SODIUM CHLORIDE 0.9 % IV SOLN
1000.0000 mL | INTRAVENOUS | Status: DC
  Administered 2017-10-16: 1000 mL via INTRAVENOUS

## 2017-10-16 MED ORDER — ONDANSETRON 8 MG PO TBDP
ORAL_TABLET | ORAL | Status: AC
  Filled 2017-10-16: qty 1

## 2017-10-16 MED ORDER — ONDANSETRON HCL 4 MG/2ML IJ SOLN
INTRAMUSCULAR | Status: AC
  Filled 2017-10-16: qty 2

## 2017-10-16 MED ORDER — BUPRENORPHINE HCL-NALOXONE HCL 8-2 MG SL FILM: 1.0000 | ORAL_FILM | Freq: Two times a day (BID) | SUBLINGUAL | Status: DC

## 2017-10-16 MED ORDER — AMLODIPINE BESYLATE 10 MG PO TABS
10.0000 mg | ORAL_TABLET | Freq: Every day | ORAL | Status: DC
  Administered 2017-10-18 – 2017-10-28 (×12): 10 mg via ORAL
  Filled 2017-10-16 (×4): qty 1
  Filled 2017-10-16: qty 2
  Filled 2017-10-16: qty 1
  Filled 2017-10-16: qty 2
  Filled 2017-10-16 (×2): qty 1
  Filled 2017-10-16: qty 2
  Filled 2017-10-16 (×5): qty 1

## 2017-10-16 MED ORDER — ACETAMINOPHEN 325 MG PO TABS
650.0000 mg | ORAL_TABLET | Freq: Four times a day (QID) | ORAL | Status: DC | PRN
  Administered 2017-10-17 – 2017-10-19 (×3): 650 mg via ORAL
  Filled 2017-10-16 (×4): qty 2

## 2017-10-16 MED ORDER — PANTOPRAZOLE SODIUM 40 MG PO TBEC
40.0000 mg | DELAYED_RELEASE_TABLET | Freq: Every day | ORAL | Status: DC
  Administered 2017-10-18 – 2017-10-28 (×12): 40 mg via ORAL
  Filled 2017-10-16 (×15): qty 1

## 2017-10-16 MED ORDER — KETAMINE HCL 10 MG/ML IJ SOLN
20.0000 mg | Freq: Once | INTRAMUSCULAR | Status: AC
  Administered 2017-10-16: 20 mg via INTRAVENOUS

## 2017-10-16 MED ORDER — ACETAMINOPHEN 650 MG RE SUPP
650.0000 mg | Freq: Four times a day (QID) | RECTAL | Status: DC | PRN
  Administered 2017-10-18 (×2): 650 mg via RECTAL
  Filled 2017-10-16 (×3): qty 1

## 2017-10-16 MED ORDER — CARVEDILOL 12.5 MG PO TABS
25.0000 mg | ORAL_TABLET | Freq: Two times a day (BID) | ORAL | Status: DC
  Administered 2017-10-17: 25 mg via ORAL
  Filled 2017-10-16: qty 2

## 2017-10-16 MED ORDER — PROMETHAZINE HCL 25 MG/ML IJ SOLN
25.0000 mg | Freq: Once | INTRAMUSCULAR | Status: AC
  Administered 2017-10-16: 25 mg via INTRAVENOUS
  Filled 2017-10-16: qty 1

## 2017-10-16 MED ORDER — SODIUM CHLORIDE 0.9 % IV SOLN
2.0000 g | INTRAVENOUS | Status: DC
  Administered 2017-10-16 – 2017-10-19 (×4): 2 g via INTRAVENOUS
  Filled 2017-10-16: qty 2
  Filled 2017-10-16: qty 20
  Filled 2017-10-16: qty 2
  Filled 2017-10-16 (×3): qty 20
  Filled 2017-10-16: qty 2
  Filled 2017-10-16: qty 20

## 2017-10-16 MED ORDER — ONDANSETRON HCL 4 MG/2ML IJ SOLN: 4.0000 mg | Freq: Once | INTRAMUSCULAR | Status: DC

## 2017-10-16 MED ORDER — ONDANSETRON 4 MG PO TBDP: 8.0000 mg | ORAL_TABLET | Freq: Once | ORAL | Status: DC

## 2017-10-16 MED ORDER — BUPRENORPHINE HCL-NALOXONE HCL 8-2 MG SL SUBL
1.0000 | SUBLINGUAL_TABLET | Freq: Two times a day (BID) | SUBLINGUAL | Status: DC
  Administered 2017-10-17 – 2017-10-28 (×15): 1 via SUBLINGUAL
  Filled 2017-10-16 (×21): qty 1

## 2017-10-16 MED ORDER — TIZANIDINE HCL 4 MG PO TABS
2.0000 mg | ORAL_TABLET | Freq: Four times a day (QID) | ORAL | Status: DC | PRN
  Administered 2017-10-18 – 2017-10-27 (×6): 2 mg via ORAL
  Filled 2017-10-16 (×6): qty 1

## 2017-10-16 MED ORDER — INSULIN ASPART 100 UNIT/ML ~~LOC~~ SOLN
15.0000 [IU] | Freq: Once | SUBCUTANEOUS | Status: AC
  Administered 2017-10-16: 15 [IU] via INTRAVENOUS

## 2017-10-16 MED ORDER — CLONIDINE HCL 0.3 MG/24HR TD PTWK
0.3000 mg | MEDICATED_PATCH | TRANSDERMAL | Status: DC
  Administered 2017-10-26: 0.3 mg via TRANSDERMAL
  Filled 2017-10-16: qty 1

## 2017-10-16 MED ORDER — INSULIN ASPART 100 UNIT/ML ~~LOC~~ SOLN: 0.0000 [IU] | Freq: Every day | SUBCUTANEOUS | Status: DC

## 2017-10-16 MED ORDER — INSULIN GLARGINE 100 UNIT/ML ~~LOC~~ SOLN
6.0000 [IU] | Freq: Two times a day (BID) | SUBCUTANEOUS | Status: DC
  Administered 2017-10-16: 6 [IU] via SUBCUTANEOUS
  Filled 2017-10-16 (×2): qty 0.06

## 2017-10-16 MED ORDER — SODIUM CHLORIDE 0.9 % IV SOLN
2.0000 g | Freq: Once | INTRAVENOUS | Status: AC
  Administered 2017-10-16: 2 g via INTRAVENOUS
  Filled 2017-10-16 (×2): qty 2

## 2017-10-16 MED ORDER — VANCOMYCIN HCL IN DEXTROSE 1-5 GM/200ML-% IV SOLN
1000.0000 mg | Freq: Once | INTRAVENOUS | Status: AC
  Administered 2017-10-16: 1000 mg via INTRAVENOUS
  Filled 2017-10-16: qty 200

## 2017-10-16 MED ORDER — ACETAMINOPHEN 500 MG PO TABS
1000.0000 mg | ORAL_TABLET | Freq: Once | ORAL | Status: AC
  Administered 2017-10-16: 1000 mg via ORAL
  Filled 2017-10-16: qty 2

## 2017-10-16 MED ORDER — BAYER MICROLET LANCETS MISC: 2 refills | Status: DC

## 2017-10-16 MED ORDER — ONDANSETRON HCL 4 MG/2ML IJ SOLN
4.0000 mg | Freq: Once | INTRAMUSCULAR | Status: AC
  Administered 2017-10-16: 4 mg via INTRAVENOUS

## 2017-10-16 MED ORDER — CALCITRIOL 0.25 MCG PO CAPS: 0.2500 ug | ORAL_CAPSULE | Freq: Every day | ORAL | Status: DC

## 2017-10-16 MED ORDER — KETAMINE HCL 10 MG/ML IJ SOLN
20.0000 mg | Freq: Once | INTRAMUSCULAR | Status: AC
  Administered 2017-10-16: 20 mg via INTRAVENOUS
  Filled 2017-10-16: qty 1

## 2017-10-16 MED ORDER — PROMETHAZINE HCL 25 MG/ML IJ SOLN
12.5000 mg | Freq: Four times a day (QID) | INTRAMUSCULAR | Status: DC | PRN
  Administered 2017-10-18 – 2017-10-29 (×3): 12.5 mg via INTRAVENOUS
  Filled 2017-10-16 (×5): qty 1

## 2017-10-16 NOTE — ED Notes (Signed)
Unsuccessful IV attempt x2. Am getting Gerda Diss to try

## 2017-10-16 NOTE — Progress Notes (Signed)
Pharmacy Antibiotic Note  Brent Daniels is a 32 y.o. male admitted on 10/16/2017 with infection.  Pharmacy has been consulted for Vancomycin dosing.  Plan: Vancomycin 2000 mg IV x 1 dose Vancomycin 1000 mg IV every 48 hours. Goal trough 15-20 mcg/mL. Ceftriaxone 2000 mg IV every 24 hours. Monitor labs, c/s, and vanco trough as indicated   Height: 6\' 1"  (185.4 cm) Weight: 198 lb (89.8 kg) IBW/kg (Calculated) : 79.9  Temp (24hrs), Avg:101 F (38.3 C), Min:100.8 F (38.2 C), Max:101.2 F (38.4 C)  Recent Labs  Lab 10/16/17 1250 10/16/17 1318  WBC 28.5*  --   CREATININE 7.79*  --   LATICACIDVEN  --  1.58    Estimated Creatinine Clearance: 15.1 mL/min (A) (by C-G formula based on SCr of 7.79 mg/dL (H)).    Allergies  Allergen Reactions  . Hydrocodone Hives  . Hydrocodone-Acetaminophen Anaphylaxis  . Nsaids Nausea And Vomiting  . Tramadol Anaphylaxis, Other (See Comments) and Nausea Only    Acid Reflux Acid Reflux  . Ibuprofen Other (See Comments) and Nausea And Vomiting    Stomach ulcers  . Morphine And Related Rash and Hives  . Naproxen Other (See Comments)    Stomach ulcers Stomach ulcers  . Sulfa Antibiotics Rash and Other (See Comments)    Stomach ulcers   . Sulfamethoxazole Other (See Comments) and Rash  . Ketorolac Other (See Comments)    Unknown    Antimicrobials this admission: Ceftriaxone 9/10 >>  Vancomycin 9/10 >>   Dose adjustments this admission: [place]  Microbiology results: 9/10 BCx: pending  Thank you for allowing pharmacy to be a part of this patient's care.  Margot Ables, PharmD Clinical Pharmacist 10/16/2017 2:40 PM

## 2017-10-16 NOTE — ED Triage Notes (Signed)
PT c/o redness, swelling, pain to right side of groin and testicle x4 days. Fever developed x3 days ago. PT was seen at his PCP this morning and told to come to ED for eval. PT has a fistula in right arm but has not started dialysis yet.

## 2017-10-16 NOTE — Telephone Encounter (Signed)
Lancets have been ordered- waiting for patient to call back to confirm which pen needles he uses

## 2017-10-16 NOTE — Plan of Care (Signed)
progressing 

## 2017-10-16 NOTE — ED Provider Notes (Signed)
[facility] EMERGENCY DEPARTMENT Provider Note   CSN: [id number] Arrival date & time: 10/16/17  1157     History   Chief Complaint Chief Complaint  Patient presents with  . Abscess    HPI Brent Daniels is a 32 y.o. male.  HPI Patient presents with concern of pain and swelling about his scrotum. Onset was about 3 days ago, initially with a palpable indurated area in his posterior scrotum. Since onset pain has been increasingly severe, incapacitating, worse with motion, ambulation, palpation. Swelling and erythema have also increased substantially, and now the patient has concern of distended scrotum, pain throughout his suprapubic region, as well as new fever, nausea. Patient has multiple medical issues including diabetes, CKD stage V. He is not on dialysis. He is here with his mother who assists with the HPI. Past Medical History:  Diagnosis Date  . Acute esophagitis   . Anemia   . Anxiety   . Arthritis   . Bicuspid aortic valve    noted on 10/20/16 TEE [facility])  . Chronic abdominal pain   . Chronic back pain   . Chronic kidney disease    Stage 5 Kidney disease  . Depression   . Diabetes mellitus    type 1  . Diabetic gastroparesis associated with type 1 diabetes mellitus ([place])   . Erosive esophagitis 12/23/2013   "severe" per EGD   . Gastroparesis   . GERD (gastroesophageal reflux disease)   . Headache    in the past  . Heart murmur    mild TR, mild MR, mild AS 02/19/17  . History of hiatal hernia   . Hypercholesteremia   . Hypertension   . Insomnia   . Nausea and vomiting    chronic, recurrent  . Neuropathy   . Pneumonia   . PUD (peptic ulcer disease)   . S/P arthroscopic knee surgery 07/04/2011  . Scoliosis 07/11/2012    Patient Active Problem List   Diagnosis Date Noted  . Intractable vomiting with nausea 05/30/2017  . Acute renal failure superimposed on stage 5 chronic kidney disease, not on chronic dialysis ([place]) 05/30/2017  . Hyperkalemia  05/29/2017  . Current smoker 01/01/2017  . Acute renal failure ([place])   . ATN (acute tubular necrosis) ([place])   . Erosive esophagitis   . Uremia 11/16/2016  . Neuropathy 11/16/2016  . Vitamin D deficiency 11/16/2016  . CKD (chronic kidney disease), stage V ([place]) 11/16/2016  . Acute renal failure superimposed on chronic kidney disease ([place]) 08/27/2016  . Intractable vomiting   . Volume depletion 08/24/2016  . Narcotic withdrawal ([place]) 08/24/2016  . Hypochloremic alkalosis 08/24/2016  . Diabetic hyperosmolar non-ketotic state ([place]) 08/24/2016  . Chronic abdominal pain 08/24/2016  . Diabetic gastroparesis ([place]) 08/24/2016  . Uncontrolled type 1 diabetes mellitus with hyperglycemia, with long-term current use of insulin ([place]) 08/24/2016  . Dehydration   . Reflux esophagitis 05/14/2015  . Nausea & vomiting 11/13/2014  . AKI (acute kidney injury) ([place]) 11/13/2014  . Hyperglycemia due to type 1 diabetes mellitus ([place]) 04/24/2014  . RUQ abdominal pain 04/14/2014  . DKA (diabetic ketoacidoses) ([place]) 04/03/2014  . Gastroparesis 03/30/2014  . Drug abuse ([place]) 03/30/2014  . Tobacco use 03/30/2014  . HLD (hyperlipidemia) 03/30/2014  . Oropharyngeal candidiasis   . Metabolic acidosis   . Abdominal fluid collection   . Abdominal pain, acute   . Esophagitis 01/03/2014  . Nausea with vomiting   . Acute esophagitis   . Hyperglycemia 12/20/2013  . Abdominal pain  12/09/2013  . PUD (peptic ulcer disease) 10/29/2013  . Leukocytosis 09/29/2013  . Sinus tachycardia 09/29/2013  . Intractable nausea and vomiting 09/28/2013  . Chronic generalized abdominal pain 09/28/2013  . Diabetic gastroparesis associated with type 1 diabetes mellitus ([place]) 09/28/2013  . Hematemesis 09/28/2013  . Diabetic neuropathy ([place]) 09/28/2013  . Abdominal pain, epigastric 08/19/2013  . Uncontrolled diabetes mellitus ([place]) 06/15/2013  . GERD (gastroesophageal reflux disease) 06/15/2013  . DKA, type 1 ([place]) 06/15/2013  .  Essential hypertension, benign 07/11/2012  . Diabetes mellitus with stage 5 chronic kidney disease ([place]) 07/11/2012  . Scoliosis 07/11/2012  . Back pain 07/11/2012  . S/P arthroscopic knee surgery 07/04/2011    Past Surgical History:  Procedure Laterality Date  . AV FISTULA PLACEMENT Right 04/09/2017   Procedure: ARTERIOVENOUS (AV) FISTULA CREATION RIGHT ARM;  Surgeon: Rosetta Posner, MD;  Location: [facility];  Service: Vascular;  Laterality: Right;  . BIOPSY  06/17/2013   Procedure: GASTRIC ULCER AND ANTRAL BIOPSIES; ESOPHAGEAL BRUSHING;  Surgeon: Danie Binder, MD;  Location: [facility];  Service: Endoscopy;;  . BIOPSY N/A 12/23/2013   Procedure: ESOPHAGEAL BIOPSY;  Surgeon: Daneil Dolin, MD;  Location: [facility];  Service: Endoscopy;  Laterality: N/A;  . CHOLECYSTECTOMY N/A 01/28/2014   Procedure: LAPAROSCOPIC CHOLECYSTECTOMY;  Surgeon: Jamesetta So, MD;  Location: [facility];  Service: General;  Laterality: N/A;  . ESOPHAGOGASTRODUODENOSCOPY (EGD) WITH PROPOFOL  06/17/2013   Dr. Oneida Alar: two gastric ulcers in fundus, mild antral gastritis, candida esophagitis  . ESOPHAGOGASTRODUODENOSCOPY (EGD) WITH PROPOFOL N/A 09/11/2013   Dr. [name]: abnormal hypopharynx, question massively enlarged tonsils, stomach full of food precluded exam, needs EGD for verification of ulcer healing at a later date  . ESOPHAGOGASTRODUODENOSCOPY (EGD) WITH PROPOFOL N/A 12/23/2013   RMR: Severe exudative esophagitis likely predominatly reflux related. Superimposed Candida infection not excluded status post KOH brushing and biopsy. Localized excoriating gastric mucosa most consistant with trauma(vomiting). No evidence of peptic ulcer disease or other gastric/duodenal pathology. I suspect severe inflammation involving the distal esophagus may account  for at least some of patii  . KNEE ARTHROSCOPY  06/30/2011   Procedure: ARTHROSCOPY KNEE;  Surgeon: Carole Civil, MD;  Location: [facility];  Service: Orthopedics;  Laterality: Right;   diagnostic arthroscopy  . TYMPANOSTOMY TUBE PLACEMENT          Home Medications    Prior to Admission medications   Medication Sig Start Date End Date Taking? Authorizing Provider  amLODipine (NORVASC) 10 MG tablet Take 10 mg by mouth daily.  05/08/15  Yes [provider]  BAYER MICROLET LANCETS lancets Use as instructed to test sugar 5 times daily 10/16/17  Yes Elayne Snare, MD  Buprenorphine HCl-Naloxone HCl 8-2 MG FILM Place 1 Film under the tongue 2 (two) times daily.   Yes [provider]  carvedilol (COREG) 25 MG tablet Take 1 tablet (25 mg total) by mouth 2 (two) times daily with a meal. 06/02/17  Yes Tat, David, MD  cloNIDine (CATAPRES - DOSED IN MG/24 HR) 0.3 mg/24hr patch Place 1 patch (0.3 mg total) onto the skin once a week. Change on Friday's 06/08/17  Yes Tat, Shanon Brow, MD  Continuous Blood Gluc Receiver (FREESTYLE LIBRE 14 DAY READER) DEVI 1 each by Does not apply route every 14 (fourteen) days. 04/05/17  Yes Nida, Marella Chimes, MD  furosemide (LASIX) 40 MG tablet Take 40 mg by mouth daily.  09/13/17  Yes [provider]  glucose blood (CONTOUR NEXT TEST) test strip  Use as instructed to test sugar 5 times daily 09/19/17  Yes Elayne Snare, MD  Insulin Glargine (BASAGLAR KWIKPEN) 100 UNIT/ML SOPN Inject 6 Units into the skin 2 (two) times daily.   Yes [provider]  insulin lispro (HUMALOG KWIKPEN) 100 UNIT/ML KiwkPen 10 units ac tid Patient taking differently: Inject 1-15 Units into the skin 3 (three) times daily. Based on sliding scale 09/19/17  Yes Elayne Snare, MD  pantoprazole (PROTONIX) 40 MG tablet Take 1 tablet (40 mg total) by mouth 2 (two) times daily before a meal. Patient taking differently: Take 40 mg by mouth daily.  11/19/16 04/04/18 Yes Johnson, Clanford L, MD  promethazine (PHENERGAN) 25 MG tablet Take 25 mg by mouth 3 (three) times daily as needed for nausea or vomiting.  09/15/17  Yes [provider]  tiZANidine (ZANAFLEX) 4 MG  tablet Take 2 mg by mouth every 6 (six) hours as needed for muscle spasms.    Yes [provider]  calcitRIOL (ROCALTROL) 0.25 MCG capsule Take 1 capsule (0.25 mcg total) by mouth daily. Patient not taking: Reported on 10/16/2017 06/02/17   Orson Eva, MD  calcium acetate (PHOSLO) 667 MG capsule Take 667 mg by mouth 3 (three) times daily with meals. 05/08/17   [provider]  gabapentin (NEURONTIN) 300 MG capsule Take 1 capsule (300 mg total) by mouth at bedtime. Patient not taking: Reported on 10/16/2017 11/19/16   Murlean Iba, MD    Family History Family History  Problem Relation Age of Onset  . Cancer Father   . Alcohol abuse Father   . Diabetes Father   . Diabetes Paternal Grandfather   . Pseudochol deficiency Neg Hx   . Malignant hyperthermia Neg Hx   . Hypotension Neg Hx   . Anesthesia problems Neg Hx   . Colon cancer Neg Hx     Social History Social History   Tobacco Use  . Smoking status: Current Some Day Smoker    Packs/day: 0.25    Years: 10.00    Pack years: 2.50    Types: Cigarettes  . Smokeless tobacco: Former Systems developer    Quit date: 02/17/2013  . Tobacco comment: smokes 4 cigarettes a day   Substance Use Topics  . Alcohol use: No  . Drug use: No     Allergies   Hydrocodone-acetaminophen; Nsaids; Tramadol; Ibuprofen; Morphine and related; Naproxen; Sulfa antibiotics; and Ketorolac   Review of Systems Review of Systems  Constitutional:       Per HPI, otherwise negative  HENT:       Per HPI, otherwise negative  Respiratory:       Per HPI, otherwise negative  Cardiovascular:       Per HPI, otherwise negative  Gastrointestinal: Positive for nausea. Negative for vomiting.  Endocrine:       Negative aside from HPI  Genitourinary:       Neg aside from HPI   Musculoskeletal:       Per HPI, otherwise negative  Skin: Positive for color change and wound.  Neurological: Negative for syncope.     Physical Exam Updated Vital Signs BP  (!) 143/75   Pulse (!) 131   Temp (!) 101.2 F (38.4 C) (Oral)   Resp (!) 23   Ht 6\' 1"  (1.854 m)   Wt 89.8 kg   SpO2 92%   BMI 26.12 kg/m   Physical Exam  Constitutional: He is oriented to person, place, and time. He appears well-developed. No distress.  Uncomfortable  appearing thin young male awake and alert  HENT:  Head: Normocephalic and atraumatic.  Eyes: Conjunctivae and EOM are normal.  Cardiovascular: Regular rhythm.  Tachycardia  Pulmonary/Chest: Effort normal. No stridor. No respiratory distress.  Abdominal: He exhibits no distension.  Genitourinary:     Musculoskeletal: He exhibits no edema.       Arms: Neurological: He is alert and oriented to person, place, and time.  Skin: Skin is warm and dry.  Psychiatric: He has a normal mood and affect.  Nursing note and vitals reviewed.    ED Treatments / Results  Labs (all labs ordered are listed, but only abnormal results are displayed) Labs Reviewed  COMPREHENSIVE METABOLIC PANEL - Abnormal; Notable for the following components:      Result Value   Sodium 127 (*)    Chloride 89 (*)    Glucose, Bld 333 (*)    BUN 53 (*)    Creatinine, Ser 7.79 (*)    GFR calc non Af Amer 8 (*)    GFR calc Af Amer 9 (*)    All other components within normal limits  CBC WITH DIFFERENTIAL/PLATELET - Abnormal; Notable for the following components:   WBC 28.5 (*)    RBC 2.87 (*)    Hemoglobin 8.5 (*)    HCT 25.8 (*)    Neutro Abs 25.4 (*)    Monocytes Absolute 2.0 (*)    All other components within normal limits  CBG MONITORING, ED - Abnormal; Notable for the following components:   Glucose-Capillary 313 (*)    All other components within normal limits  CULTURE, BLOOD (ROUTINE X 2)  CULTURE, BLOOD (ROUTINE X 2)  URINALYSIS, ROUTINE W REFLEX MICROSCOPIC  I-STAT CG4 LACTIC ACID, ED  I-STAT CG4 LACTIC ACID, ED    EKG EKG Interpretation  Date/Time:  Tuesday October 16 2017 12:16:25 EDT Ventricular Rate:  115 PR  Interval:    QRS Duration: 99 QT Interval:  334 QTC Calculation: 462 R Axis:   50 Text Interpretation:  Sinus tachycardia Borderline repolarization abnormality Abnormal ekg Confirmed by Carmin Muskrat [phone]) on 10/16/2017 12:52:26 PM   Radiology Ct Abdomen Pelvis Wo Contrast  Result Date: 10/16/2017 CLINICAL DATA:  Four-day history of pain, erythema and swelling involving the RIGHT hemiscrotum, associated with fever. Current history of end-stage renal disease for which the patient had a an AV fistula placed on 04/09/2017. Surgical history includes cholecystectomy. EXAM: CT ABDOMEN AND PELVIS WITHOUT CONTRAST TECHNIQUE: Multidetector CT imaging of the abdomen and pelvis was performed following the standard protocol without IV contrast. COMPARISON:  07/10/2017, 05/29/2017 and earlier. FINDINGS: Lower chest: Minimal linear scar/atelectasis in the lingula and both lower lobes. Visualized lung bases otherwise clear. Resolution of RIGHT LOWER LOBE pneumonia since 07/10/2017. Heart mildly enlarged with RIGHT coronary artery atherosclerosis. Hepatobiliary: Normal unenhanced appearance of the liver. Surgically absent gallbladder. No unexpected biliary ductal dilation. Pancreas: Normal unenhanced appearance. Spleen: Normal unenhanced appearance. Adrenals/Urinary Tract: Normal appearing adrenal glands. No evidence of urinary tract calculi or obstruction on either side. Within the limits of the unenhanced technique, no focal parenchymal abnormality involving either kidney. Normal appearing urinary bladder. Stomach/Bowel: Thickening of the wall of the distal esophagus to the level of the EG junction as noted previously. Normal appearing decompressed stomach. Normal-appearing small bowel. Normal-appearing colon with expected stool burden. Normal appendix in the RIGHT UPPER pelvis. Vascular/Lymphatic: Moderate aorto-iliofemoral atherosclerosis without evidence of aneurysm. Enlarged LEFT periaortic retroperitoneal lymph  nodes, the largest node measuring approximately 2.3 x 2.1 cm (  series 2, image 34), increased in size since the most recent prior CT 07/10/2017 where it measured 1.8 x 1.7 cm at the same level. Multiple upper normal size to slightly enlarged BILATERAL superficial inguinal lymph nodes, increased in size since the most recent prior CT. Reproductive: Prostate gland and seminal vesicles normal in size and appearance for age. Extensive edema/inflammation involving the RIGHT hemiscrotum with marked skin thickening and a likely fluid collection within the thickened skin measuring approximately 1.6 cm (series 3, image 18). Mild edema/inflammation involving the contralateral LEFT hemiscrotum. Large BILATERAL hydroceles. No evidence of gas within the scrotal tissues. Other: None. Musculoskeletal: Large Schmorl's node in the UPPER endplate of R91 with mild compression deformity of the UPPER endplate of M38, unchanged. No new/acute findings. IMPRESSION: 1. Edema/inflammation involving the RIGHT hemiscrotum with marked skin thickening and a likely abscess within the thickened skin. Edema/inflammation to a lesser degree involves the contralateral LEFT hemiscrotum. Large BILATERAL hydroceles are present. No evidence of gas within the scrotal tissues to suggest Fournier's gangrene. Scrotal ultrasound is recommended in further evaluation to better delineate these findings. 2. Enlarging LEFT periaortic lymph nodes and BILATERAL superficial inguinal lymph nodes since the most recent prior CT 3 months ago. While these are statistically likely reactive, a low-grade lymphoproliferative disorder could have a similar appearance. 3. Stable marked thickening of the wall of the visualized distal esophagus to the level of the EG junction dating back to at least 2017, likely chronic esophagitis. 4.  Aortic Atherosclerosis, advanced for patient age.  (ICD10-170.0) Electronically Signed   By: Evangeline Dakin M.D.   On: 10/16/2017 15:10   US  Scrotum W/doppler  Result Date: 10/16/2017 CLINICAL DATA:  Right-sided pain with edema and redness over the last 4 days EXAM: SCROTAL ULTRASOUND DOPPLER ULTRASOUND OF THE TESTICLES TECHNIQUE: Complete ultrasound examination of the testicles, epididymis, and other scrotal structures was performed. Color and spectral Doppler ultrasound were also utilized to evaluate blood flow to the testicles. COMPARISON:  CT abdomen pelvis of 10/16/2016 FINDINGS: Right testicle Measurements: The right testicle measures 3.9 x 2.4 x 2.6 cm. No intratesticular abnormality is seen. There is blood flow to the right testicle with arterial and venous waveforms. Left testicle Measurements: The left testicle measures 4.2 x 2.7 x 2.6 cm. No intratesticular abnormality is seen. Blood flow is demonstrated to the left testicle with arterial and venous waveforms. Right epididymis:  The right epididymis is unremarkable. Left epididymis: The left epididymis appears enlarged and inhomogeneous as well as hypervascular suggesting left epididymitis. Hydrocele: There is a small right hydrocele present which appears partially septated. Varicocele:  No varicocele is seen. Pulsed Doppler interrogation of both testes demonstrates normal low resistance arterial and venous waveforms bilaterally. There is diffuse scrotal edema and skin thickening present. No discrete abscess is detected. IMPRESSION: 1. Diffusely edematous scrotal wall.  No abscess. 2. Irregular and enlarged left epididymis with hypervascularity suggestive of left epididymitis. 3. No intratesticular abnormality is seen. Blood flow is demonstrated to both testicles with arterial and venous waveforms. 4. Small right hydrocele which appears septated. Electronically Signed   By: Ivar Drape M.D.   On: 10/16/2017 16:22    Procedures Procedures (including critical care time)  Medications Ordered in ED Medications  0.9 %  sodium chloride infusion ( Intravenous Stopped 10/16/17 1305)    cefTRIAXone (ROCEPHIN) 2 g in sodium chloride 0.9 % 100 mL IVPB (0 g Intravenous Stopped 10/16/17 1348)  ondansetron (ZOFRAN-ODT) disintegrating tablet 8 mg (8 mg Oral Not Given 10/16/17 1257)  ondansetron [facility]) injection 4 mg (4 mg Intravenous Not Given 10/16/17 1509)  vancomycin (VANCOCIN) IVPB 1000 mg/200 mL premix (has no administration in time range)  vancomycin (VANCOCIN) IVPB 1000 mg/200 mL premix (0 mg Intravenous Stopped 10/16/17 1457)  ketamine (KETALAR) injection 20 mg (20 mg Intravenous Given 10/16/17 1258)  vancomycin (VANCOCIN) IVPB 1000 mg/200 mL premix (1,000 mg Intravenous New Bag/Given 10/16/17 1458)  ondansetron (ZOFRAN) injection 4 mg (4 mg Intravenous Given 10/16/17 1422)  ketamine (KETALAR) injection 20 mg (20 mg Intravenous Given 10/16/17 1459)  acetaminophen (TYLENOL) tablet 1,000 mg (1,000 mg Oral Given 10/16/17 1459)  promethazine (PHENERGAN) injection 25 mg (25 mg Intravenous Given 10/16/17 1501)     Initial Impression / Assessment and Plan / ED Course  I have reviewed the triage vital signs and the nursing notes.  Pertinent labs & imaging results that were available during my care of the patient were reviewed by me and considered in my medical decision making (see chart for details).   With concern for sepsis, given likely source of scrotal infection, versus Fournier's gangrene, the patient had empiric antibiotic provided, CT scan ordered, received IV fluid resuscitation as well as ketamine for pain control. Patient has a history of substance abuse, is on Suboxone, is not a candidate for additional narcotics.   Update:, Initial CT results discussed the patient and his mother. No evidence for Fournier's gangrene, but there is evidence for scrotal cellulitis, and with concern for abscess, the patient will have ultrasound performed. Patient had substantial pain control with ketamine, though this is now wearing off, pain is persistently nauseous, uncomfortable. Additional  dosing, as well as Phenergan provided.  4:54 PM Percent does not demonstrate abscess, is consistent with epididymitis and cellulitis Patient is Artie received broad-spectrum antibiotics, continues to receive analgesia, antiemetics. With no evidence for abscess, no indication for surgery, but given concern for sepsis, patient will require admission for further evaluation management.  Final Clinical Impressions(s) / ED Diagnoses  Sepsis   CRITICAL CARE Performed by: Carmin Muskrat Total critical care time: 45 minutes Critical care time was exclusive of separately billable procedures and treating other patients. Critical care was necessary to treat or prevent imminent or life-threatening deterioration. Critical care was time spent personally by me on the following activities: development of treatment plan with patient and/or surrogate as well as nursing, discussions with consultants, evaluation of patient's response to treatment, examination of patient, obtaining history from patient or surrogate, ordering and performing treatments and interventions, ordering and review of laboratory studies, ordering and review of radiographic studies, pulse oximetry and re-evaluation of patient's condition.      Carmin Muskrat, MD 10/16/17 1655

## 2017-10-16 NOTE — ED Notes (Signed)
Pt has thrown up 4 times since being here. Have given Zofran. Will notify MD

## 2017-10-16 NOTE — Progress Notes (Addendum)
Patient CBG read (high >600), ordered stat lab, critical value cam back, blood glucose 796. Paged Georges Mouse about patient with high cbg. Patient was also vomiting and clammy. MD gave order for 15 units novolog change fluid to NS at 125 and stat BMP.  Awaiting results of BMP now, will continue to monitor patient.

## 2017-10-16 NOTE — Consult Note (Signed)
H&P Physician requesting consult: Jani Gravel, MD  Chief Complaint: Scrotal cellulitis  History of Present Illness: 32 year old male with a history of diabetes type 1 and CKD stage V presents with a several day history of scrotal swelling.  This started Saturday.  It has progressively worsened.  He was seen by primary care physician and told to go to the emergency department.  In the emergency department, he was febrile and tachycardic.  White blood cell count 28.5 with a creatinine of 7.79 and sodium of 127.  CT scan was performed in the emergency department that revealed severe scrotal edema.  There was also possible fluid collection on the right hemiscrotum.  Follow-up scrotal ultrasound revealed no evidence of fluid collection to suggest abscess.  CT also revealed periaortic retroperitoneal lymphadenopathy well as some inguinal lymphadenopathy.  Patient currently complains about nausea and vomiting.  He also complains about some scrotal pain.  Of note, CT scan showed normal kidneys with no hydronephrosis bilaterally.  Glucose was 333  Past Medical History:  Diagnosis Date  . Acute esophagitis   . Anemia   . Anxiety   . Arthritis   . Bicuspid aortic valve    noted on 10/20/16 [facility])  . Chronic abdominal pain   . Chronic back pain   . Chronic kidney disease    Stage 5 Kidney disease  . Depression   . Diabetes mellitus    type 1  . Diabetic gastroparesis associated with type 1 diabetes mellitus ([place])   . Erosive esophagitis 12/23/2013   "severe" per EGD   . Gastroparesis   . GERD (gastroesophageal reflux disease)   . Headache    in the past  . Heart murmur    mild TR, mild MR, mild AS 02/19/17  . History of hiatal hernia   . Hypercholesteremia   . Hypertension   . Insomnia   . Nausea and vomiting    chronic, recurrent  . Neuropathy   . Pneumonia   . PUD (peptic ulcer disease)   . S/P arthroscopic knee surgery 07/04/2011  . Scoliosis 07/11/2012   Past Surgical History:   Procedure Laterality Date  . AV FISTULA PLACEMENT Right 04/09/2017   Procedure: ARTERIOVENOUS (AV) FISTULA CREATION RIGHT ARM;  Surgeon: Rosetta Posner, MD;  Location: [facility];  Service: Vascular;  Laterality: Right;  . BIOPSY  06/17/2013   Procedure: GASTRIC ULCER AND ANTRAL BIOPSIES; ESOPHAGEAL BRUSHING;  Surgeon: Danie Binder, MD;  Location: [facility];  Service: Endoscopy;;  . BIOPSY N/A 12/23/2013   Procedure: ESOPHAGEAL BIOPSY;  Surgeon: Daneil Dolin, MD;  Location: [facility];  Service: Endoscopy;  Laterality: N/A;  . CHOLECYSTECTOMY N/A 01/28/2014   Procedure: LAPAROSCOPIC CHOLECYSTECTOMY;  Surgeon: Jamesetta So, MD;  Location: [facility];  Service: General;  Laterality: N/A;  . ESOPHAGOGASTRODUODENOSCOPY (EGD) WITH PROPOFOL  06/17/2013   Dr. Oneida Alar: two gastric ulcers in fundus, mild antral gastritis, candida esophagitis  . ESOPHAGOGASTRODUODENOSCOPY (EGD) WITH PROPOFOL N/A 09/11/2013   Dr. [name]: abnormal hypopharynx, question massively enlarged tonsils, stomach full of food precluded exam, needs EGD for verification of ulcer healing at a later date  . ESOPHAGOGASTRODUODENOSCOPY (EGD) WITH PROPOFOL N/A 12/23/2013   RMR: Severe exudative esophagitis likely predominatly reflux related. Superimposed Candida infection not excluded status post KOH brushing and biopsy. Localized excoriating gastric mucosa most consistant with trauma(vomiting). No evidence of peptic ulcer disease or other gastric/duodenal pathology. I suspect severe inflammation involving the distal esophagus may account  for at least some of patii  .  KNEE ARTHROSCOPY  06/30/2011   Procedure: ARTHROSCOPY KNEE;  Surgeon: Carole Civil, MD;  Location: [facility];  Service: Orthopedics;  Laterality: Right;  diagnostic arthroscopy  . TYMPANOSTOMY TUBE PLACEMENT      Home Medications:  Medications Prior to Admission  Medication Sig Dispense Refill Last Dose  . amLODipine (NORVASC) 10 MG tablet Take 10 mg by mouth daily.    10/15/2017 at  Unknown time  . BAYER MICROLET LANCETS lancets Use as instructed to test sugar 5 times daily 500 each 2   . Buprenorphine HCl-Naloxone HCl 8-2 MG FILM Place 1 Film under the tongue 2 (two) times daily.   10/16/2017 at Unknown time  . carvedilol (COREG) 25 MG tablet Take 1 tablet (25 mg total) by mouth 2 (two) times daily with a meal. 60 tablet 1 10/15/2017 at Unknown time  . cloNIDine (CATAPRES - DOSED IN MG/24 HR) 0.3 mg/24hr patch Place 1 patch (0.3 mg total) onto the skin once a week. Change on Friday's 4 patch 1 Past Week at Unknown time  . Continuous Blood Gluc Receiver (FREESTYLE LIBRE 14 DAY READER) DEVI 1 each by Does not apply route every 14 (fourteen) days. 1 Device 0 Taking  . furosemide (LASIX) 40 MG tablet Take 40 mg by mouth daily.   1 10/15/2017 at Unknown time  . glucose blood (CONTOUR NEXT TEST) test strip Use as instructed to test sugar 5 times daily 500 each 0   . Insulin Glargine (BASAGLAR KWIKPEN) 100 UNIT/ML SOPN Inject 6 Units into the skin 2 (two) times daily.   10/16/2017 at Unknown time  . insulin lispro (HUMALOG KWIKPEN) 100 UNIT/ML KiwkPen 10 units ac tid (Patient taking differently: Inject 1-15 Units into the skin 3 (three) times daily. Based on sliding scale) 27 mL 1 10/16/2017 at Unknown time  . pantoprazole (PROTONIX) 40 MG tablet Take 1 tablet (40 mg total) by mouth 2 (two) times daily before a meal. (Patient taking differently: Take 40 mg by mouth daily. ) 60 tablet 0 10/15/2017 at Unknown time  . promethazine (PHENERGAN) 25 MG tablet Take 25 mg by mouth 3 (three) times daily as needed for nausea or vomiting.   1 10/15/2017 at Unknown time  . tiZANidine (ZANAFLEX) 4 MG tablet Take 2 mg by mouth every 6 (six) hours as needed for muscle spasms.    10/15/2017 at Unknown time  . calcitRIOL (ROCALTROL) 0.25 MCG capsule Take 1 capsule (0.25 mcg total) by mouth daily. (Patient not taking: Reported on 10/16/2017) 30 capsule 1 Not Taking at Unknown time  . calcium acetate (PHOSLO) 667 MG  capsule Take 667 mg by mouth 3 (three) times daily with meals.  3 Taking  . gabapentin (NEURONTIN) 300 MG capsule Take 1 capsule (300 mg total) by mouth at bedtime. (Patient not taking: Reported on 10/16/2017)   Not Taking at Unknown time   Allergies:  Allergies  Allergen Reactions  . Nsaids Nausea And Vomiting  . Tramadol Anaphylaxis, Other (See Comments) and Nausea Only    Acid Reflux Acid Reflux  . Ibuprofen Other (See Comments) and Nausea And Vomiting    Stomach ulcers  . Morphine And Related Rash and Hives  . Naproxen Other (See Comments)    Stomach ulcers Stomach ulcers  . Sulfa Antibiotics Rash and Other (See Comments)    Stomach ulcers   . Hydrocodone Bitartrate     Anaphylaxis Can take plain Tylenol  . Ketorolac Other (See Comments)    Unknown    Family History  Problem Relation Age of Onset  . Cancer Father   . Alcohol abuse Father   . Diabetes Father   . Diabetes Paternal Grandfather   . Pseudochol deficiency Neg Hx   . Malignant hyperthermia Neg Hx   . Hypotension Neg Hx   . Anesthesia problems Neg Hx   . Colon cancer Neg Hx    Social History:  reports that he has been smoking cigarettes. He has a 2.50 pack-year smoking history. He quit smokeless tobacco use about 4 years ago. He reports that he does not drink alcohol or use drugs.  ROS: A complete review of systems was performed.  All systems are negative except for pertinent findings as noted. ROS   Physical Exam:  Vital signs in last 24 hours: Temp:  [99.5 F (37.5 C)-101.2 F (38.4 C)] 99.5 F (37.5 C) (09/10 1743) Pulse Rate:  [48-132] 122 (09/10 1849) Resp:  [12-34] 17 (09/10 1849) BP: (116-180)/(75-124) 133/97 (09/10 1849) SpO2:  [92 %-100 %] 99 % (09/10 1849) Weight:  [89.8 kg] 89.8 kg (09/10 1205) General:  Alert and oriented, actively vomiting HEENT: Normocephalic, atraumatic Neck: No JVD or lymphadenopathy Cardiovascular: Regular rate and rhythm Lungs: Regular rate and effort Abdomen:  Soft, nontender, nondistended, no abdominal masses Genitourinary: Patient is circumcised.  He has significant penile and scrotal edema.  There is overlying erythema.  The scrotum is diffusely tender to palpation.  There is no fluctuance or crepitus.  I do not see an obvious abscess or fluid collection.  No current evidence of Fournier's gangrene. Back: No CVA tenderness Neurologic: Grossly intact  Laboratory Data:  Results for orders placed or performed during the hospital encounter of 10/16/17 (from the past 24 hour(s))  CBG monitoring, ED     Status: Abnormal   Collection Time: 10/16/17 12:09 PM  Result Value Ref Range   Glucose-Capillary 313 (H) 70 - 99 mg/dL  Comprehensive metabolic panel     Status: Abnormal   Collection Time: 10/16/17 12:50 PM  Result Value Ref Range   Sodium 127 (L) 135 - 145 mmol/L   Potassium 4.7 3.5 - 5.1 mmol/L   Chloride 89 (L) 98 - 111 mmol/L   CO2 23 22 - 32 mmol/L   Glucose, Bld 333 (H) 70 - 99 mg/dL   BUN 53 (H) 6 - 20 mg/dL   Creatinine, Ser 7.79 (H) 0.61 - 1.24 mg/dL   Calcium 9.3 8.9 - 10.3 mg/dL   Total Protein 7.8 6.5 - 8.1 g/dL   Albumin 3.7 3.5 - 5.0 g/dL   AST 18 15 - 41 U/L   ALT 7 0 - 44 U/L   Alkaline Phosphatase 88 38 - 126 U/L   Total Bilirubin 0.8 0.3 - 1.2 mg/dL   GFR calc non Af Amer 8 (L) >60 mL/min   GFR calc Af Amer 9 (L) >60 mL/min   Anion gap 15 5 - 15  CBC WITH DIFFERENTIAL     Status: Abnormal   Collection Time: 10/16/17 12:50 PM  Result Value Ref Range   WBC 28.5 (H) 4.0 - 10.5 K/uL   RBC 2.87 (L) 4.22 - 5.81 MIL/uL   Hemoglobin 8.5 (L) 13.0 - 17.0 g/dL   HCT 25.8 (L) 39.0 - 52.0 %   MCV 89.9 78.0 - 100.0 fL   MCH 29.6 26.0 - 34.0 pg   MCHC 32.9 30.0 - 36.0 g/dL   RDW 15.4 11.5 - 15.5 %   Platelets 350 150 - 400 K/uL   Neutrophils Relative %  89 %   Lymphocytes Relative 4 %   Monocytes Relative 7 %   Eosinophils Relative 0 %   Basophils Relative 0 %   Neutro Abs 25.4 (H) 1.7 - 7.7 K/uL   Lymphs Abs 1.1 0.7 - 4.0  K/uL   Monocytes Absolute 2.0 (H) 0.1 - 1.0 K/uL   Eosinophils Absolute 0.0 0.0 - 0.7 K/uL   Basophils Absolute 0.0 0.0 - 0.1 K/uL   WBC Morphology WHITE COUNT CONFIRMED ON SMEAR   Blood Culture (routine x 2)     Status: None (Preliminary result)   Collection Time: 10/16/17 12:50 PM  Result Value Ref Range   Specimen Description BLOOD LEFT ANTECUBITAL    Special Requests      BOTTLES DRAWN AEROBIC AND ANAEROBIC Blood Culture results may not be optimal due to an inadequate volume of blood received in culture bottles Performed at [facility], [address], [place], [place] [address]    Culture PENDING    Report Status PENDING   I-Stat CG4 Lactic Acid, ED  (not at  [facility])     Status: None   Collection Time: 10/16/17  1:18 PM  Result Value Ref Range   Lactic Acid, Venous 1.58 0.5 - 1.9 mmol/L   Recent Results (from the past 240 hour(s))  Blood Culture (routine x 2)     Status: None (Preliminary result)   Collection Time: 10/16/17 12:50 PM  Result Value Ref Range Status   Specimen Description BLOOD LEFT ANTECUBITAL  Final   Special Requests   Final    BOTTLES DRAWN AEROBIC AND ANAEROBIC Blood Culture results may not be optimal due to an inadequate volume of blood received in culture bottles Performed at [facility], [address], [place], [place] [address]    Culture PENDING  Incomplete   Report Status PENDING  Incomplete   Creatinine: Recent Labs    10/16/17 1250  CREATININE 7.79*   CT and scrotal ultrasound personally reviewed and are discussed in the history of present illness.  Impression/Assessment:  Scrotal and penile edema with overlying cellulitis Sepsis secondary to the above    Plan:  Agree with broad-spectrum antibiotics.  He is currently on cefepime and ceftriaxone as well as vancomycin.  Needs aggressive blood glucose control.  Please call with any questions or concerns.  Marton Redwood, III 10/16/2017, 8:33 PM

## 2017-10-16 NOTE — Progress Notes (Signed)
CRITICAL VALUE ALERT  Critical Value: Glucose 832  Date & Time Notied:  10/16/17 2355  Provider Notified: D. Olevia Bowens  Orders Received/Actions taken: awaiting new orders

## 2017-10-16 NOTE — H&P (Signed)
TRH H&P   Patient Demographics:    Brent Daniels, is a 32 y.o. male  MRN: [id number]   DOB - 1985/11/03  Admit Date - 10/16/2017  Outpatient Primary MD for the patient is Glenda Chroman, MD  Referring MD/NP/PA:  Carmin Muskrat  Outpatient Specialists:   Hinda Lenis  Patient coming from:    home  Chief Complaint  Patient presents with  . Abscess      HPI:    Brent Daniels  is a 32 y.o. male, w dm1, gastroparesis, ckd stage5, apparently c/o swelling of his scrotum starting on Saturday along with some fever, and redness.  Pt states that went to pcp and seen by PA and was told to go to ER for evaluation.    In Ed,  T 101.2, P 124  Bp 116/97  Pox 98% on RA  Wbc 28.5, Hgb 8.5, Plt 350  Na 127, K 4.7, Bun 53, Creatinine 7.79  Alb 3.7  Ast 18, Alt 7  Lactic acid 1.58  Blood culture x 2 pending  CT scan abd/ pelvis IMPRESSION: 1. Edema/inflammation involving the RIGHT hemiscrotum with marked skin thickening and a likely abscess within the thickened skin. Edema/inflammation to a lesser degree involves the contralateral LEFT hemiscrotum. Large BILATERAL hydroceles are present. No evidence of gas within the scrotal tissues to suggest Fournier's gangrene. Scrotal ultrasound is recommended in further evaluation to better delineate these findings. 2. Enlarging LEFT periaortic lymph nodes and BILATERAL superficial inguinal lymph nodes since the most recent prior CT 3 months ago.  While these are statistically likely reactive, a low-grade lymphoproliferative disorder could have a similar appearance.  3. Stable marked thickening of the wall of the visualized distal esophagus to the level of the EG junction dating back to at least 2017, likely chronic esophagitis. 4.  Aortic Atherosclerosis, advanced for patient age.  (ICD10-170.0)  Scrotal ultrasound IMPRESSION: 1. Diffusely  edematous scrotal wall.  No abscess. 2. Irregular and enlarged left epididymis with hypervascularity suggestive of left epididymitis. 3. No intratesticular abnormality is seen. Blood flow is demonstrated to both testicles with arterial and venous waveforms. 4. Small right hydrocele which appears septated.  Pt received iv vancomycin in the ED,   Pt will require inpatient admission for sepsis (Fever, tachycardiac, leukocytosis), secondary to cellulitis/ epididymitis as well as hydrocele, and ARF on CRF.     Review of systems:    In addition to the HPI above,    No Headache, No changes with Vision or hearing, No problems swallowing food or Liquids, No Chest pain, Cough or Shortness of Breath, No Abdominal pain,  Bowel movements are regular, No Blood in stool or Urine, No dysuria, No new joints pains-aches,  No new weakness, tingling, numbness in any extremity, No recent weight gain or loss, No polyuria, polydypsia or polyphagia, No significant Mental Stressors.  A full 10 point Review of Systems was  done, except as stated above, all other Review of Systems were negative.   With Past History of the following :    Past Medical History:  Diagnosis Date  . Acute esophagitis   . Anemia   . Anxiety   . Arthritis   . Bicuspid aortic valve    noted on 10/20/16 [facility])  . Chronic abdominal pain   . Chronic back pain   . Chronic kidney disease    Stage 5 Kidney disease  . Depression   . Diabetes mellitus    type 1  . Diabetic gastroparesis associated with type 1 diabetes mellitus ([place])   . Erosive esophagitis 12/23/2013   "severe" per EGD   . Gastroparesis   . GERD (gastroesophageal reflux disease)   . Headache    in the past  . Heart murmur    mild TR, mild MR, mild AS 02/19/17  . History of hiatal hernia   . Hypercholesteremia   . Hypertension   . Insomnia   . Nausea and vomiting    chronic, recurrent  . Neuropathy   . Pneumonia   . PUD (peptic ulcer disease)    . S/P arthroscopic knee surgery 07/04/2011  . Scoliosis 07/11/2012      Past Surgical History:  Procedure Laterality Date  . AV FISTULA PLACEMENT Right 04/09/2017   Procedure: ARTERIOVENOUS (AV) FISTULA CREATION RIGHT ARM;  Surgeon: Rosetta Posner, MD;  Location: [facility];  Service: Vascular;  Laterality: Right;  . BIOPSY  06/17/2013   Procedure: GASTRIC ULCER AND ANTRAL BIOPSIES; ESOPHAGEAL BRUSHING;  Surgeon: Danie Binder, MD;  Location: [facility];  Service: Endoscopy;;  . BIOPSY N/A 12/23/2013   Procedure: ESOPHAGEAL BIOPSY;  Surgeon: Daneil Dolin, MD;  Location: [facility];  Service: Endoscopy;  Laterality: N/A;  . CHOLECYSTECTOMY N/A 01/28/2014   Procedure: LAPAROSCOPIC CHOLECYSTECTOMY;  Surgeon: Jamesetta So, MD;  Location: [facility];  Service: General;  Laterality: N/A;  . ESOPHAGOGASTRODUODENOSCOPY (EGD) WITH PROPOFOL  06/17/2013   Dr. Oneida Alar: two gastric ulcers in fundus, mild antral gastritis, candida esophagitis  . ESOPHAGOGASTRODUODENOSCOPY (EGD) WITH PROPOFOL N/A 09/11/2013   Dr. [name]: abnormal hypopharynx, question massively enlarged tonsils, stomach full of food precluded exam, needs EGD for verification of ulcer healing at a later date  . ESOPHAGOGASTRODUODENOSCOPY (EGD) WITH PROPOFOL N/A 12/23/2013   RMR: Severe exudative esophagitis likely predominatly reflux related. Superimposed Candida infection not excluded status post KOH brushing and biopsy. Localized excoriating gastric mucosa most consistant with trauma(vomiting). No evidence of peptic ulcer disease or other gastric/duodenal pathology. I suspect severe inflammation involving the distal esophagus may account  for at least some of patii  . KNEE ARTHROSCOPY  06/30/2011   Procedure: ARTHROSCOPY KNEE;  Surgeon: Carole Civil, MD;  Location: [facility];  Service: Orthopedics;  Laterality: Right;  diagnostic arthroscopy  . TYMPANOSTOMY TUBE PLACEMENT        Social History:     Social History   Tobacco Use  . Smoking status:  Current Some Day Smoker    Packs/day: 0.25    Years: 10.00    Pack years: 2.50    Types: Cigarettes  . Smokeless tobacco: Former Systems developer    Quit date: 02/17/2013  . Tobacco comment: smokes 4 cigarettes a day   Substance Use Topics  . Alcohol use: No     Lives - at home  Mobility - walks by self   Family History :     Family History  Problem Relation Age of  Onset  . Cancer Father   . Alcohol abuse Father   . Diabetes Father   . Diabetes Paternal Grandfather   . Pseudochol deficiency Neg Hx   . Malignant hyperthermia Neg Hx   . Hypotension Neg Hx   . Anesthesia problems Neg Hx   . Colon cancer Neg Hx        Home Medications:   Prior to Admission medications   Medication Sig Start Date End Date Taking? Authorizing Provider  amLODipine (NORVASC) 10 MG tablet Take 10 mg by mouth daily.  05/08/15  Yes [provider]  BAYER MICROLET LANCETS lancets Use as instructed to test sugar 5 times daily 10/16/17  Yes Elayne Snare, MD  Buprenorphine HCl-Naloxone HCl 8-2 MG FILM Place 1 Film under the tongue 2 (two) times daily.   Yes [provider]  carvedilol (COREG) 25 MG tablet Take 1 tablet (25 mg total) by mouth 2 (two) times daily with a meal. 06/02/17  Yes Tat, David, MD  cloNIDine (CATAPRES - DOSED IN MG/24 HR) 0.3 mg/24hr patch Place 1 patch (0.3 mg total) onto the skin once a week. Change on Friday's 06/08/17  Yes Tat, Shanon Brow, MD  Continuous Blood Gluc Receiver (FREESTYLE LIBRE 14 DAY READER) DEVI 1 each by Does not apply route every 14 (fourteen) days. 04/05/17  Yes Nida, Marella Chimes, MD  furosemide (LASIX) 40 MG tablet Take 40 mg by mouth daily.  09/13/17  Yes [provider]  glucose blood (CONTOUR NEXT TEST) test strip Use as instructed to test sugar 5 times daily 09/19/17  Yes Elayne Snare, MD  Insulin Glargine (BASAGLAR KWIKPEN) 100 UNIT/ML SOPN Inject 6 Units into the skin 2 (two) times daily.   Yes [provider]  insulin lispro (HUMALOG  KWIKPEN) 100 UNIT/ML KiwkPen 10 units ac tid Patient taking differently: Inject 1-15 Units into the skin 3 (three) times daily. Based on sliding scale 09/19/17  Yes Elayne Snare, MD  pantoprazole (PROTONIX) 40 MG tablet Take 1 tablet (40 mg total) by mouth 2 (two) times daily before a meal. Patient taking differently: Take 40 mg by mouth daily.  11/19/16 04/04/18 Yes Johnson, Clanford L, MD  promethazine (PHENERGAN) 25 MG tablet Take 25 mg by mouth 3 (three) times daily as needed for nausea or vomiting.  09/15/17  Yes [provider]  tiZANidine (ZANAFLEX) 4 MG tablet Take 2 mg by mouth every 6 (six) hours as needed for muscle spasms.    Yes [provider]  calcitRIOL (ROCALTROL) 0.25 MCG capsule Take 1 capsule (0.25 mcg total) by mouth daily. Patient not taking: Reported on 10/16/2017 06/02/17   Orson Eva, MD  calcium acetate (PHOSLO) 667 MG capsule Take 667 mg by mouth 3 (three) times daily with meals. 05/08/17   [provider]  gabapentin (NEURONTIN) 300 MG capsule Take 1 capsule (300 mg total) by mouth at bedtime. Patient not taking: Reported on 10/16/2017 11/19/16   Murlean Iba, MD     Allergies:     Allergies  Allergen Reactions  . Hydrocodone-Acetaminophen Anaphylaxis  . Nsaids Nausea And Vomiting  . Tramadol Anaphylaxis, Other (See Comments) and Nausea Only    Acid Reflux Acid Reflux  . Ibuprofen Other (See Comments) and Nausea And Vomiting    Stomach ulcers  . Morphine And Related Rash and Hives  . Naproxen Other (See Comments)    Stomach ulcers Stomach ulcers  . Sulfa Antibiotics Rash and Other (See Comments)    Stomach ulcers   .  Ketorolac Other (See Comments)    Unknown     Physical Exam:   Vitals  Blood pressure (!) 151/80, pulse (!) 123, temperature (!) 101.2 F (38.4 C), temperature source Oral, resp. rate (!) 24, height 6\' 1"  (1.854 m), weight 89.8 kg, SpO2 92 %.   1. General  lying in bed in grimacing  2. Normal affect and  insight, Not Suicidal or Homicidal, Awake Alert, Oriented X 3.  3. No F.N deficits, ALL C.Nerves Intact, Strength 5/5 all 4 extremities, Sensation intact all 4 extremities, Plantars down going.  4. Ears and Eyes appear Normal, Conjunctivae clear, PERRLA. Moist Oral Mucosa.  5. Supple Neck, No JVD, No cervical lymphadenopathy appriciated, No Carotid Bruits.  6. Symmetrical Chest wall movement, Good air movement bilaterally, CTAB.  7. Tachycardic s1, s2,   8. Positive Bowel Sounds, Abdomen Soft, No tenderness, No organomegaly appriciated,No rebound -guarding or rigidity.  9.  No Cyanosis, redness of the scrotum , + bilateral scrotal swelling  10. Good muscle tone,  joints appear normal , no effusions, Normal ROM.  11. No Palpable Lymph Nodes in Neck or Axillae + inguinal lymphadenopathy  R AVF  +thrill    Data Review:    CBC Recent Labs  Lab 10/16/17 1250  WBC 28.5*  HGB 8.5*  HCT 25.8*  PLT 350  MCV 89.9  MCH 29.6  MCHC 32.9  RDW 15.4  LYMPHSABS 1.1  MONOABS 2.0*  EOSABS 0.0  BASOSABS 0.0   ------------------------------------------------------------------------------------------------------------------  Chemistries  Recent Labs  Lab 10/16/17 1250  NA 127*  K 4.7  CL 89*  CO2 23  GLUCOSE 333*  BUN 53*  CREATININE 7.79*  CALCIUM 9.3  AST 18  ALT 7  ALKPHOS 88  BILITOT 0.8   ------------------------------------------------------------------------------------------------------------------ estimated creatinine clearance is 15.1 mL/min (A) (by C-G formula based on SCr of 7.79 mg/dL (H)). ------------------------------------------------------------------------------------------------------------------ No results for input(s): TSH, T4TOTAL, T3FREE, THYROIDAB in the last 72 hours.  Invalid input(s): FREET3  Coagulation profile No results for input(s): INR, PROTIME in the last 168  hours. ------------------------------------------------------------------------------------------------------------------- No results for input(s): DDIMER in the last 72 hours. -------------------------------------------------------------------------------------------------------------------  Cardiac Enzymes No results for input(s): CKMB, TROPONINI, MYOGLOBIN in the last 168 hours.  Invalid input(s): CK ------------------------------------------------------------------------------------------------------------------ No results found for: BNP   ---------------------------------------------------------------------------------------------------------------  Urinalysis    Component Value Date/Time   COLORURINE YELLOW 05/28/2017 [place] 05/28/2017 2209   LABSPEC 1.025 05/28/2017 2209   PHURINE 5.5 05/28/2017 2209   GLUCOSEU >=500 (A) 05/28/2017 2209   HGBUR SMALL (A) 05/28/2017 2209   BILIRUBINUR NEGATIVE 05/28/2017 2209   KETONESUR >80 (A) 05/28/2017 2209   PROTEINUR 100 (A) 05/28/2017 2209   UROBILINOGEN 1.0 11/13/2014 0155   NITRITE NEGATIVE 05/28/2017 2209   LEUKOCYTESUR NEGATIVE 05/28/2017 2209    ----------------------------------------------------------------------------------------------------------------   Imaging Results:    Ct Abdomen Pelvis Wo Contrast  Result Date: 10/16/2017 CLINICAL DATA:  Four-day history of pain, erythema and swelling involving the RIGHT hemiscrotum, associated with fever. Current history of end-stage renal disease for which the patient had a an AV fistula placed on 04/09/2017. Surgical history includes cholecystectomy. EXAM: CT ABDOMEN AND PELVIS WITHOUT CONTRAST TECHNIQUE: Multidetector CT imaging of the abdomen and pelvis was performed following the standard protocol without IV contrast. COMPARISON:  07/10/2017, 05/29/2017 and earlier. FINDINGS: Lower chest: Minimal linear scar/atelectasis in the lingula and both lower lobes.  Visualized lung bases otherwise clear. Resolution of RIGHT LOWER LOBE pneumonia since 07/10/2017. Heart mildly enlarged with RIGHT coronary  artery atherosclerosis. Hepatobiliary: Normal unenhanced appearance of the liver. Surgically absent gallbladder. No unexpected biliary ductal dilation. Pancreas: Normal unenhanced appearance. Spleen: Normal unenhanced appearance. Adrenals/Urinary Tract: Normal appearing adrenal glands. No evidence of urinary tract calculi or obstruction on either side. Within the limits of the unenhanced technique, no focal parenchymal abnormality involving either kidney. Normal appearing urinary bladder. Stomach/Bowel: Thickening of the wall of the distal esophagus to the level of the EG junction as noted previously. Normal appearing decompressed stomach. Normal-appearing small bowel. Normal-appearing colon with expected stool burden. Normal appendix in the RIGHT UPPER pelvis. Vascular/Lymphatic: Moderate aorto-iliofemoral atherosclerosis without evidence of aneurysm. Enlarged LEFT periaortic retroperitoneal lymph nodes, the largest node measuring approximately 2.3 x 2.1 cm (series 2, image 34), increased in size since the most recent prior CT 07/10/2017 where it measured 1.8 x 1.7 cm at the same level. Multiple upper normal size to slightly enlarged BILATERAL superficial inguinal lymph nodes, increased in size since the most recent prior CT. Reproductive: Prostate gland and seminal vesicles normal in size and appearance for age. Extensive edema/inflammation involving the RIGHT hemiscrotum with marked skin thickening and a likely fluid collection within the thickened skin measuring approximately 1.6 cm (series 3, image 18). Mild edema/inflammation involving the contralateral LEFT hemiscrotum. Large BILATERAL hydroceles. No evidence of gas within the scrotal tissues. Other: None. Musculoskeletal: Large Schmorl's node in the UPPER endplate of W11 with mild compression deformity of the UPPER  endplate of B14, unchanged. No new/acute findings. IMPRESSION: 1. Edema/inflammation involving the RIGHT hemiscrotum with marked skin thickening and a likely abscess within the thickened skin. Edema/inflammation to a lesser degree involves the contralateral LEFT hemiscrotum. Large BILATERAL hydroceles are present. No evidence of gas within the scrotal tissues to suggest Fournier's gangrene. Scrotal ultrasound is recommended in further evaluation to better delineate these findings. 2. Enlarging LEFT periaortic lymph nodes and BILATERAL superficial inguinal lymph nodes since the most recent prior CT 3 months ago. While these are statistically likely reactive, a low-grade lymphoproliferative disorder could have a similar appearance. 3. Stable marked thickening of the wall of the visualized distal esophagus to the level of the EG junction dating back to at least 2017, likely chronic esophagitis. 4.  Aortic Atherosclerosis, advanced for patient age.  (ICD10-170.0) Electronically Signed   By: Evangeline Dakin M.D.   On: 10/16/2017 15:10   US Scrotum W/doppler  Result Date: 10/16/2017 CLINICAL DATA:  Right-sided pain with edema and redness over the last 4 days EXAM: SCROTAL ULTRASOUND DOPPLER ULTRASOUND OF THE TESTICLES TECHNIQUE: Complete ultrasound examination of the testicles, epididymis, and other scrotal structures was performed. Color and spectral Doppler ultrasound were also utilized to evaluate blood flow to the testicles. COMPARISON:  CT abdomen pelvis of 10/16/2016 FINDINGS: Right testicle Measurements: The right testicle measures 3.9 x 2.4 x 2.6 cm. No intratesticular abnormality is seen. There is blood flow to the right testicle with arterial and venous waveforms. Left testicle Measurements: The left testicle measures 4.2 x 2.7 x 2.6 cm. No intratesticular abnormality is seen. Blood flow is demonstrated to the left testicle with arterial and venous waveforms. Right epididymis:  The right epididymis is  unremarkable. Left epididymis: The left epididymis appears enlarged and inhomogeneous as well as hypervascular suggesting left epididymitis. Hydrocele: There is a small right hydrocele present which appears partially septated. Varicocele:  No varicocele is seen. Pulsed Doppler interrogation of both testes demonstrates normal low resistance arterial and venous waveforms bilaterally. There is diffuse scrotal edema and skin thickening present. No discrete abscess is  detected. IMPRESSION: 1. Diffusely edematous scrotal wall.  No abscess. 2. Irregular and enlarged left epididymis with hypervascularity suggestive of left epididymitis. 3. No intratesticular abnormality is seen. Blood flow is demonstrated to both testicles with arterial and venous waveforms. 4. Small right hydrocele which appears septated. Electronically Signed   By: Ivar Drape M.D.   On: 10/16/2017 16:22       Assessment & Plan:    Principal Problem:   Sepsis ([place]) Active Problems:   Tachycardia   Nausea & vomiting   Diabetic gastroparesis ([place])   Uncontrolled type 1 diabetes mellitus with hyperglycemia, with long-term current use of insulin ([facility])   Renal failure (ARF), acute on chronic ([facility])   Cellulitis    Sepsis (fever, tachycardia, Leukocytosis) secondary to cellulitis, epidydimitis Blood culture x2 Urinalysis pending vanco iv, cefepime iv  Bilateral hydrocele Urology (Dr. Gloriann Loan) consulted, appreciate input,  Will be by in AM  Cellulitis, Epididymitis Iv abx as above  ARF on CRF Hydrate with ns iv HOLD Lasix Check cmp in am Nephrology consult placed in computer  Tachycardia Trop iq 6h x3 Check TSH If persistent consider checking cardiac echo  Anemia Check cbc in am  Hyponatremia Hydrate with ns iv Check cmp in am  Dm2 Cont basaglar 6 units [place] bid fsbs ac and qhs, ISS  Diabetic gastroparesis N/v Zofran 4mg  iv q6h prn  Phenergan 12.5mg  iv q6h prn  Chronic pain/ opioid dep Cont  suboxone  Hypertension Cont Amlodipine 10mg  po qday Cont Carvedilol 25mg  po bid     DVT Prophylaxis Heparin - SCDs  AM Labs Ordered, also please review Full Orders  Family Communication: Admission, patients condition and plan of care including tests being ordered have been discussed with the patient who indicate understanding and agree with the plan and Code Status.  Code Status  FULL CODE  Likely DC to  home  Condition GUARDED    Consults called:   Urology (called personally), spoke with DR. Bell , consult in AM for hydrocele,  Nephrology consult placed in computer  Admission status: inpatient,  Pt meets sepsis criteria, significant leukocytosis Wbc 28.5, fever, tachycardia.  Pt will require iv abx, as well as iv hydration for Acute renal failure (Creatinine 7.79) pt will likely need >=2 nites stay to treat sepsis as well as for hydration to improve creatinine to baseline.   Time spent in minutes : 70   Jani Gravel M.D on 10/16/2017 at 5:24 PM  Between 7am to 7pm - Pager - [phone]   After 7pm go to [email or web address] - password [email or web address]  [facility] - Office  [phone]

## 2017-10-17 DIAGNOSIS — E1143 Type 2 diabetes mellitus with diabetic autonomic (poly)neuropathy: Secondary | ICD-10-CM

## 2017-10-17 DIAGNOSIS — E0810 Diabetes mellitus due to underlying condition with ketoacidosis without coma: Secondary | ICD-10-CM

## 2017-10-17 DIAGNOSIS — N451 Epididymitis: Secondary | ICD-10-CM

## 2017-10-17 DIAGNOSIS — E1110 Type 2 diabetes mellitus with ketoacidosis without coma: Secondary | ICD-10-CM

## 2017-10-17 DIAGNOSIS — F17200 Nicotine dependence, unspecified, uncomplicated: Secondary | ICD-10-CM

## 2017-10-17 DIAGNOSIS — E1010 Type 1 diabetes mellitus with ketoacidosis without coma: Secondary | ICD-10-CM

## 2017-10-17 DIAGNOSIS — N433 Hydrocele, unspecified: Secondary | ICD-10-CM

## 2017-10-17 DIAGNOSIS — R112 Nausea with vomiting, unspecified: Secondary | ICD-10-CM

## 2017-10-17 LAB — URINALYSIS, ROUTINE W REFLEX MICROSCOPIC
Bacteria, UA: NONE SEEN
Bilirubin Urine: NEGATIVE
Glucose, UA: >=500 mg/dL — AB
KETONES UR: 20 mg/dL — AB
Leukocytes, UA: NEGATIVE
Nitrite: NEGATIVE
PH: 5 (ref 5.0–8.0)
Protein, ur: 100 mg/dL — AB
SPECIFIC GRAVITY, URINE: 1.014 (ref 1.005–1.030)

## 2017-10-17 LAB — GLUCOSE, CAPILLARY
GLUCOSE-CAPILLARY: 538 mg/dL — AB (ref 70–99)
GLUCOSE-CAPILLARY: 206 mg/dL — AB (ref 70–99)
GLUCOSE-CAPILLARY: 159 mg/dL — AB (ref 70–99)
GLUCOSE-CAPILLARY: 104 mg/dL — AB (ref 70–99)
GLUCOSE-CAPILLARY: 134 mg/dL — AB (ref 70–99)
GLUCOSE-CAPILLARY: 222 mg/dL — AB (ref 70–99)
Glucose-Capillary: 553 mg/dL (ref 70–99)
Glucose-Capillary: >600 mg/dL (ref 70–99)
Glucose-Capillary: 573 mg/dL (ref 70–99)
Glucose-Capillary: 417 mg/dL — ABNORMAL HIGH (ref 70–99)
Glucose-Capillary: 425 mg/dL — ABNORMAL HIGH (ref 70–99)
Glucose-Capillary: 340 mg/dL — ABNORMAL HIGH (ref 70–99)
Glucose-Capillary: 228 mg/dL — ABNORMAL HIGH (ref 70–99)
Glucose-Capillary: 234 mg/dL — ABNORMAL HIGH (ref 70–99)
Glucose-Capillary: 182 mg/dL — ABNORMAL HIGH (ref 70–99)
Glucose-Capillary: 137 mg/dL — ABNORMAL HIGH (ref 70–99)
Glucose-Capillary: 116 mg/dL — ABNORMAL HIGH (ref 70–99)
Glucose-Capillary: 107 mg/dL — ABNORMAL HIGH (ref 70–99)
Glucose-Capillary: 103 mg/dL — ABNORMAL HIGH (ref 70–99)
Glucose-Capillary: 121 mg/dL — ABNORMAL HIGH (ref 70–99)

## 2017-10-17 LAB — BASIC METABOLIC PANEL
ANION GAP: 16 — AB (ref 5–15)
ANION GAP: 18 — AB (ref 5–15)
Anion gap: 18 — ABNORMAL HIGH (ref 5–15)
Anion gap: 13 (ref 5–15)
BUN: 59 mg/dL — ABNORMAL HIGH (ref 6–20)
BUN: 60 mg/dL — ABNORMAL HIGH (ref 6–20)
BUN: 57 mg/dL — ABNORMAL HIGH (ref 6–20)
BUN: 55 mg/dL — ABNORMAL HIGH (ref 6–20)
CALCIUM: 8.8 mg/dL — AB (ref 8.9–10.3)
CALCIUM: 9 mg/dL (ref 8.9–10.3)
CHLORIDE: 100 mmol/L (ref 98–111)
CO2: 22 mmol/L (ref 22–32)
CO2: 17 mmol/L — AB (ref 22–32)
CO2: 16 mmol/L — ABNORMAL LOW (ref 22–32)
CO2: 23 mmol/L (ref 22–32)
Calcium: 8.9 mg/dL (ref 8.9–10.3)
Calcium: 9.2 mg/dL (ref 8.9–10.3)
Chloride: 89 mmol/L — ABNORMAL LOW (ref 98–111)
Chloride: 97 mmol/L — ABNORMAL LOW (ref 98–111)
Chloride: 97 mmol/L — ABNORMAL LOW (ref 98–111)
Creatinine, Ser: 7.86 mg/dL — ABNORMAL HIGH (ref 0.61–1.24)
Creatinine, Ser: 7.41 mg/dL — ABNORMAL HIGH (ref 0.61–1.24)
Creatinine, Ser: 7.28 mg/dL — ABNORMAL HIGH (ref 0.61–1.24)
Creatinine, Ser: 6.95 mg/dL — ABNORMAL HIGH (ref 0.61–1.24)
GFR calc Af Amer: 9 mL/min — ABNORMAL LOW (ref >60)
GFR calc Af Amer: 10 mL/min — ABNORMAL LOW (ref >60)
GFR calc non Af Amer: 8 mL/min — ABNORMAL LOW (ref >60)
GFR calc non Af Amer: 9 mL/min — ABNORMAL LOW (ref >60)
GFR, EST AFRICAN AMERICAN: 10 mL/min — AB (ref >60)
GFR, EST AFRICAN AMERICAN: 11 mL/min — AB (ref >60)
GFR, EST NON AFRICAN AMERICAN: 9 mL/min — AB (ref >60)
GFR, EST NON AFRICAN AMERICAN: 9 mL/min — AB (ref >60)
GLUCOSE: 596 mg/dL — AB (ref 70–99)
Glucose, Bld: 250 mg/dL — ABNORMAL HIGH (ref 70–99)
Glucose, Bld: 259 mg/dL — ABNORMAL HIGH (ref 70–99)
Glucose, Bld: 132 mg/dL — ABNORMAL HIGH (ref 70–99)
POTASSIUM: 4.7 mmol/L (ref 3.5–5.1)
POTASSIUM: 4.8 mmol/L (ref 3.5–5.1)
POTASSIUM: 4 mmol/L (ref 3.5–5.1)
Potassium: 4 mmol/L (ref 3.5–5.1)
Sodium: 127 mmol/L — ABNORMAL LOW (ref 135–145)
Sodium: 135 mmol/L (ref 135–145)
Sodium: 131 mmol/L — ABNORMAL LOW (ref 135–145)
Sodium: 133 mmol/L — ABNORMAL LOW (ref 135–145)

## 2017-10-17 LAB — CBC
HEMATOCRIT: 26.1 % — AB (ref 39.0–52.0)
HEMOGLOBIN: 8.5 g/dL — AB (ref 13.0–17.0)
MCH: 29.4 pg (ref 26.0–34.0)
MCHC: 32.6 g/dL (ref 30.0–36.0)
MCV: 90.3 fL (ref 78.0–100.0)
Platelets: 356 10*3/uL (ref 150–400)
RBC: 2.89 MIL/uL — AB (ref 4.22–5.81)
RDW: 15 % (ref 11.5–15.5)
WBC: 28.5 10*3/uL — AB (ref 4.0–10.5)

## 2017-10-17 LAB — HIV ANTIBODY (ROUTINE TESTING W REFLEX): HIV Screen 4th Generation wRfx: NONREACTIVE

## 2017-10-17 LAB — MRSA PCR SCREENING: MRSA BY PCR: NEGATIVE

## 2017-10-17 MED ORDER — INSULIN ASPART 100 UNIT/ML IV SOLN
5.0000 [IU] | Freq: Once | INTRAVENOUS | Status: AC
  Administered 2017-10-17: 5 [IU] via INTRAVENOUS

## 2017-10-17 MED ORDER — DEXTROSE-NACL 5-0.45 % IV SOLN
INTRAVENOUS | Status: AC
  Administered 2017-10-17: 09:00:00 via INTRAVENOUS

## 2017-10-17 MED ORDER — SODIUM CHLORIDE 0.9 % IV SOLN: INTRAVENOUS | Status: DC

## 2017-10-17 MED ORDER — POTASSIUM CHLORIDE IN NACL 20-0.45 MEQ/L-% IV SOLN: INTRAVENOUS | Status: DC

## 2017-10-17 MED ORDER — SODIUM CHLORIDE 0.45 % IV BOLUS: 2000.0000 mL | Freq: Once | INTRAVENOUS | Status: DC

## 2017-10-17 MED ORDER — HEPARIN SODIUM (PORCINE) 5000 UNIT/ML IJ SOLN: 5000.0000 [IU] | Freq: Three times a day (TID) | INTRAMUSCULAR | Status: DC

## 2017-10-17 MED ORDER — INSULIN GLARGINE 100 UNIT/ML ~~LOC~~ SOLN
5.0000 [IU] | Freq: Two times a day (BID) | SUBCUTANEOUS | Status: DC
  Administered 2017-10-17: 5 [IU] via SUBCUTANEOUS
  Filled 2017-10-17 (×4): qty 0.05

## 2017-10-17 MED ORDER — SODIUM CHLORIDE 0.45 % IV BOLUS
1000.0000 mL | Freq: Once | INTRAVENOUS | Status: AC
  Administered 2017-10-17: 1000 mL via INTRAVENOUS

## 2017-10-17 MED ORDER — SODIUM CHLORIDE 0.9 % IV SOLN
INTRAVENOUS | Status: AC
  Filled 2017-10-17: qty 1

## 2017-10-17 MED ORDER — SODIUM CHLORIDE 0.9 % IV SOLN
INTRAVENOUS | Status: AC
  Administered 2017-10-17: 01:00:00 via INTRAVENOUS

## 2017-10-17 MED ORDER — POTASSIUM CHLORIDE 10 MEQ/100ML IV SOLN: 10.0000 meq | INTRAVENOUS | Status: DC

## 2017-10-17 MED ORDER — METOPROLOL TARTRATE 5 MG/5ML IV SOLN
5.0000 mg | Freq: Four times a day (QID) | INTRAVENOUS | Status: DC
  Administered 2017-10-17 – 2017-10-20 (×9): 5 mg via INTRAVENOUS
  Filled 2017-10-17 (×9): qty 5

## 2017-10-17 MED ORDER — METOCLOPRAMIDE HCL 5 MG/ML IJ SOLN
5.0000 mg | Freq: Three times a day (TID) | INTRAMUSCULAR | Status: DC
  Administered 2017-10-17 – 2017-10-28 (×32): 5 mg via INTRAVENOUS
  Filled 2017-10-17 (×36): qty 2

## 2017-10-17 MED ORDER — ONDANSETRON HCL 4 MG/2ML IJ SOLN
4.0000 mg | Freq: Four times a day (QID) | INTRAMUSCULAR | Status: DC
  Administered 2017-10-17 – 2017-10-19 (×9): 4 mg via INTRAVENOUS
  Filled 2017-10-17 (×9): qty 2

## 2017-10-17 MED ORDER — POTASSIUM CHLORIDE 10 MEQ/100ML IV SOLN
10.0000 meq | INTRAVENOUS | Status: AC
  Administered 2017-10-17 (×2): 10 meq via INTRAVENOUS
  Filled 2017-10-17 (×2): qty 100

## 2017-10-17 MED ORDER — SODIUM CHLORIDE 0.9 % IV SOLN
100.0000 mg | Freq: Two times a day (BID) | INTRAVENOUS | Status: DC
  Administered 2017-10-17 (×2): 100 mg via INTRAVENOUS
  Filled 2017-10-17 (×5): qty 100

## 2017-10-17 MED ORDER — SODIUM CHLORIDE 0.9 % IV SOLN
INTRAVENOUS | Status: DC
  Administered 2017-10-17: 5.4 [IU]/h via INTRAVENOUS
  Filled 2017-10-17 (×2): qty 1

## 2017-10-17 NOTE — Progress Notes (Signed)
Subjective: The patient continues to have intermittent fevers. He has had no improvement in his scrotal pain.  Objective: Vital signs in last 24 hours: Temp:  [98 F (36.7 C)-100.9 F (38.3 C)] 100 F (37.8 C) (09/11 1600) Pulse Rate:  [96-124] 105 (09/11 1200) Resp:  [13-29] 14 (09/11 1500) BP: (133-200)/(75-125) 183/93 (09/11 1500) SpO2:  [89 %-100 %] 91 % (09/11 1200) Weight:  [86.7 kg-88 kg] 88 kg (09/11 0400)  Intake/Output from previous day: 09/10 0701 - 09/11 0700 In: 1377 [I.V.:579.5; IV Piggyback:797.5] Out: 1600 [Urine:1600] Intake/Output this shift: Total I/O In: 455.5 [I.V.:95.8; IV Piggyback:359.6] Out: -   Physical Exam:  General:alert, cooperative and appears stated age GI: soft, non tender, normal bowel sounds, no palpable masses, no organomegaly, no inguinal hernia Male genitalia: no penile lesions or discharge Scrotum: edema: bilateral and erythema: bilateral Extremities: extremities normal, atraumatic, no cyanosis or edema  Lab Results: Recent Labs    10/16/17 1250 10/17/17 0342  HGB 8.5* 8.5*  HCT 25.8* 26.1*   BMET Recent Labs    10/17/17 0342 10/17/17 0850  NA 127* 135  K 4.7 4.8  CL 89* 100  CO2 22 17*  GLUCOSE 596* 250*  BUN 59* 60*  CREATININE 7.86* 7.41*  CALCIUM 8.9 9.2   No results for input(s): LABPT, INR in the last 72 hours. No results for input(s): LABURIN in the last 72 hours. Results for orders placed or performed during the hospital encounter of 10/16/17  Blood Culture (routine x 2)     Status: None (Preliminary result)   Collection Time: 10/16/17 12:50 PM  Result Value Ref Range Status   Specimen Description BLOOD LEFT ANTECUBITAL  Final   Special Requests   Final    BOTTLES DRAWN AEROBIC AND ANAEROBIC Blood Culture results may not be optimal due to an inadequate volume of blood received in culture bottles   Culture   Final    NO GROWTH < 24 HOURS Performed at [facility], [address] [place], [place]  [address]    Report Status PENDING  Incomplete  Blood Culture (routine x 2)     Status: None (Preliminary result)   Collection Time: 10/16/17  9:07 PM  Result Value Ref Range Status   Specimen Description LEFT ANTECUBITAL  Final   Special Requests   Final    BOTTLES DRAWN AEROBIC AND ANAEROBIC Blood Culture adequate volume   Culture   Final    NO GROWTH < 24 HOURS Performed at [facility], [address], [place], [place] [address]    Report Status PENDING  Incomplete  MRSA PCR Screening     Status: None   Collection Time: 10/17/17  4:37 AM  Result Value Ref Range Status   MRSA by PCR NEGATIVE NEGATIVE Final    Comment:        The GeneXpert MRSA Assay (FDA approved for NASAL specimens only), is one component of a comprehensive MRSA colonization surveillance program. It is not intended to diagnose MRSA infection nor to guide or monitor treatment for MRSA infections. Performed at [facility], [address], [place], [place] [address]     Studies/Results: Ct Abdomen Pelvis Wo Contrast  Result Date: 10/16/2017 CLINICAL DATA:  Four-day history of pain, erythema and swelling involving the RIGHT hemiscrotum, associated with fever. Current history of end-stage renal disease for which the patient had a an AV fistula placed on 04/09/2017. Surgical history includes cholecystectomy. EXAM: CT ABDOMEN AND PELVIS WITHOUT CONTRAST TECHNIQUE: Multidetector CT imaging of the  abdomen and pelvis was performed following the standard protocol without IV contrast. COMPARISON:  07/10/2017, 05/29/2017 and earlier. FINDINGS: Lower chest: Minimal linear scar/atelectasis in the lingula and both lower lobes. Visualized lung bases otherwise clear. Resolution of RIGHT LOWER LOBE pneumonia since 07/10/2017. Heart mildly enlarged with RIGHT coronary artery atherosclerosis. Hepatobiliary: Normal unenhanced appearance of the liver. Surgically absent gallbladder. No unexpected biliary ductal dilation. Pancreas: Normal  unenhanced appearance. Spleen: Normal unenhanced appearance. Adrenals/Urinary Tract: Normal appearing adrenal glands. No evidence of urinary tract calculi or obstruction on either side. Within the limits of the unenhanced technique, no focal parenchymal abnormality involving either kidney. Normal appearing urinary bladder. Stomach/Bowel: Thickening of the wall of the distal esophagus to the level of the EG junction as noted previously. Normal appearing decompressed stomach. Normal-appearing small bowel. Normal-appearing colon with expected stool burden. Normal appendix in the RIGHT UPPER pelvis. Vascular/Lymphatic: Moderate aorto-iliofemoral atherosclerosis without evidence of aneurysm. Enlarged LEFT periaortic retroperitoneal lymph nodes, the largest node measuring approximately 2.3 x 2.1 cm (series 2, image 34), increased in size since the most recent prior CT 07/10/2017 where it measured 1.8 x 1.7 cm at the same level. Multiple upper normal size to slightly enlarged BILATERAL superficial inguinal lymph nodes, increased in size since the most recent prior CT. Reproductive: Prostate gland and seminal vesicles normal in size and appearance for age. Extensive edema/inflammation involving the RIGHT hemiscrotum with marked skin thickening and a likely fluid collection within the thickened skin measuring approximately 1.6 cm (series 3, image 18). Mild edema/inflammation involving the contralateral LEFT hemiscrotum. Large BILATERAL hydroceles. No evidence of gas within the scrotal tissues. Other: None. Musculoskeletal: Large Schmorl's node in the UPPER endplate of M19 with mild compression deformity of the UPPER endplate of Q22, unchanged. No new/acute findings. IMPRESSION: 1. Edema/inflammation involving the RIGHT hemiscrotum with marked skin thickening and a likely abscess within the thickened skin. Edema/inflammation to a lesser degree involves the contralateral LEFT hemiscrotum. Large BILATERAL hydroceles are  present. No evidence of gas within the scrotal tissues to suggest Fournier's gangrene. Scrotal ultrasound is recommended in further evaluation to better delineate these findings. 2. Enlarging LEFT periaortic lymph nodes and BILATERAL superficial inguinal lymph nodes since the most recent prior CT 3 months ago. While these are statistically likely reactive, a low-grade lymphoproliferative disorder could have a similar appearance. 3. Stable marked thickening of the wall of the visualized distal esophagus to the level of the EG junction dating back to at least 2017, likely chronic esophagitis. 4.  Aortic Atherosclerosis, advanced for patient age.  (ICD10-170.0) Electronically Signed   By: Evangeline Dakin M.D.   On: 10/16/2017 15:10   US Scrotum W/doppler  Result Date: 10/16/2017 CLINICAL DATA:  Right-sided pain with edema and redness over the last 4 days EXAM: SCROTAL ULTRASOUND DOPPLER ULTRASOUND OF THE TESTICLES TECHNIQUE: Complete ultrasound examination of the testicles, epididymis, and other scrotal structures was performed. Color and spectral Doppler ultrasound were also utilized to evaluate blood flow to the testicles. COMPARISON:  CT abdomen pelvis of 10/16/2016 FINDINGS: Right testicle Measurements: The right testicle measures 3.9 x 2.4 x 2.6 cm. No intratesticular abnormality is seen. There is blood flow to the right testicle with arterial and venous waveforms. Left testicle Measurements: The left testicle measures 4.2 x 2.7 x 2.6 cm. No intratesticular abnormality is seen. Blood flow is demonstrated to the left testicle with arterial and venous waveforms. Right epididymis:  The right epididymis is unremarkable. Left epididymis: The left epididymis appears enlarged and inhomogeneous as well as  hypervascular suggesting left epididymitis. Hydrocele: There is a small right hydrocele present which appears partially septated. Varicocele:  No varicocele is seen. Pulsed Doppler interrogation of both testes  demonstrates normal low resistance arterial and venous waveforms bilaterally. There is diffuse scrotal edema and skin thickening present. No discrete abscess is detected. IMPRESSION: 1. Diffusely edematous scrotal wall.  No abscess. 2. Irregular and enlarged left epididymis with hypervascularity suggestive of left epididymitis. 3. No intratesticular abnormality is seen. Blood flow is demonstrated to both testicles with arterial and venous waveforms. 4. Small right hydrocele which appears septated. Electronically Signed   By: Ivar Drape M.D.   On: 10/16/2017 16:22    Assessment/Plan: 32yo with scrotal cellulitis improving on antibiotics -Please continue broad spectrum antibiotics. No obvious abscess on exam.     LOS: 1 day   Nicolette Bang 10/17/2017, 4:43 PM

## 2017-10-17 NOTE — Progress Notes (Signed)
Patient has still not been able to take his morning medications because of nausea. This nurse has given him Zofran and Reglan but patient says that his nausea hasn't stopped enough for him to take meds.   Patient does have a temperature of 100.9 F. Asked patient if he would be able to take Tylenol PO and he said no. I brought in suppository for patient and he refused to let me do that. Explained to patient the risks of having a fever and the need to take Tylenol, he still refused. Will continue to monitor.

## 2017-10-17 NOTE — Progress Notes (Signed)
Inpatient Diabetes Program Recommendations  AACE/ADA: New Consensus Statement on Inpatient Glycemic Control (2019)  Target Ranges:  Prepandial:   less than 140 mg/dL      Peak postprandial:   less than 180 mg/dL (1-2 hours)      Critically ill patients:  140 - 180 mg/dL   Results for Brent Daniels, Brent Daniels (MRN [id number]) as of 10/17/2017 13:32  Ref. Range 10/17/2017 07:30 10/17/2017 08:31 10/17/2017 09:27 10/17/2017 10:40 10/17/2017 12:37  Glucose-Capillary Latest Ref Range: 70 - 99 mg/dL 340 (H) 228 (H) 206 (H) 159 (H) 104 (H)  Results for Brent Daniels, Brent Daniels (MRN [id number]) as of 10/17/2017 13:32  Ref. Range 10/16/2017 21:00 10/16/2017 23:13 10/17/2017 03:42  Glucose Latest Ref Range: 70 - 99 mg/dL 796 (HH) 832 (HH) 596 [facility])   Results for Brent Daniels, Brent Daniels (MRN [id number]) as of 10/17/2017 13:32  Ref. Range 09/19/2017 14:55  Hemoglobin A1C Latest Ref Range: 4.0 - 5.6 % 8.1 (A)  Results for Brent Daniels, Brent Daniels (MRN [id number]) as of 10/17/2017 13:32  Ref. Range 10/16/2017 23:13 10/17/2017 03:42 10/17/2017 08:50  CO2 Latest Ref Range: 22 - 32 mmol/L 13 (L) 22 17 (L)  Results for Brent Daniels, Brent Daniels (MRN [id number]) as of 10/17/2017 13:32  Ref. Range 10/16/2017 23:13 10/17/2017 03:42 10/17/2017 08:50  Anion gap Latest Ref Range: 5 - 15  26 (H) 16 (H) 18 (H)   Review of Glycemic Control  Diabetes history: DM1 (makes NO insulin; requires insulin for basal, correction, and meal coverage) Outpatient Diabetes medications: Basaglar 6 units BID, Humalog 1-15 units TID (for meal coverage and correction) Current orders for Inpatient glycemic control: IV insulin drip per DKA order set  Inpatient Diabetes Program Recommendations: Insulin - Basal: At time of transition from IV to SQ insulin, recommend ordering Lantus 20 units Q24H. Correction (SSI): At time of transition from IV to SQ insulin, recommend ordering CBGs with Novolog 0-9 units Q4H. Insulin - Meal Coverage: At time of transition from IV to SQ insulin, recommend ordering  Novolog 3 units TID with meals for meal coverage if patient eats at least 50% of meals. HgbA1C: A1C 8.1% on 09/19/17 indicating an average glucose of 186 mg/dl.  NOTE: In reviewing chart, noted patient use to see Dr. Dorris Fetch but starting seeing Dr. Dwyane Dee (Endocrinologist) for DM management. Patient seen Dr. Dwyane Dee on 09/19/17 for initial appointment and patient was taking Lantus 20 units QHS, Regular TID with meals (Carb coverage 1:8 (1 unit to cover 8 grams of carbs) and Correction 1:40 mg/dl (1 unit drops glucose 40 mg/dl) over target glucose of 100 mg/dl.  DM medications were adjusted by Dr. Dwyane Dee on 09/19/17 to: "Split Lantus to twice a day. He will use 6 units twice a day and given him titration instructions to go up 1 unit every 3 to 4 days to keep before breakfast and suppertime readings at least under 150. Since he needs some more rapidly acting insulin he will switch Novolin R to NovoLog and he may be able to take this right after eating also. To change correction factor to 1: 40 instead of 30 to avoid overcorrection. Consider changing Lantus to Tyler Aas once he has finished his supply."  Thanks, Barnie Alderman, RN, MSN, CDE Diabetes Coordinator Inpatient Diabetes Program [phone] (Team Pager from 8am to 5pm)

## 2017-10-17 NOTE — Telephone Encounter (Signed)
Pts mother called back and patient uses 31 gage 50mm pen needles. Thanks.

## 2017-10-17 NOTE — Progress Notes (Signed)
Notified patient family that would be moved to ICU higher level of care

## 2017-10-17 NOTE — Progress Notes (Addendum)
PROGRESS NOTE  Brent Daniels VZD:[id number] DOB: 15-Jun-1985 DOA: 10/16/2017 PCP: Glenda Chroman, MD  Brief History56  32 year old male with a history of type 1 diabetes mellitus, diabetic gastroparesis, CKD stage V, tobacco abuse, chronic abdominal pain, erosive esophagitis, hypertension, GERD presenting with 3 to 4-day history of scrotal pain and swelling associated with suprapubic pain.  The patient states that he is able to urinate spontaneously.  He has had some subjective fevers and chills.  He states that he has had nausea and vomiting for the past 2 days.  He denies any hematemesis, hematochezia, melena.  The patient has a well-documented history of poor compliance, but claims to have compliance with his antihypertensive medications as well as his insulin at home.  Upon presentation, the patient was noted to have serum glucose up to 832 with anion gap of 26.  WBC was 28.5.  CT of the abdomen and pelvis showed extensive edema and inflammation of the right hemiscrotum with thickened skin and fluid collection within the thickened skin proximally 1.6 cm.  There is also mild edema and inflammation of the left hemiscrotum.  There is large bilateral hydroceles.  There is no soft tissue gas.  The patient was initially started on vancomycin and ceftriaxone.  Assessment/Plan: Sepsis  -secondary to scrotal cellulitis and epididymitis -continue ceftriaxone and vancomycin -add doxy -follow blood cultures -UA without pyuria  Epididymitis -continue ceftriaxone -add doxy -check urine NAA for GC and chlamydia  Scrotal cellulitis -continue vancomycin  DKA, type I -patient started on IV insulin with q 1 hour CBG check and q 4 hour BMPs -pt started on aggressive fluid resuscitation -Electrolytes were monitored and repleted -transitioned to [place] insulin when gap improves -Patient continues to have elevated anion gap secondary to acute on chronic renal failure and impaired bicarbonate  reabsorption -8/14/2019HbA1C--8.1  Intractable vomiting -secondary to exacerbation gastroparesis from acute medical illness -around-the-clock Zofran -start IV Reglan -Patient with a history of erosive esophagitis-->continuePPI -Urinalysis negative for pyuria -check LFTs--normal -05/29/2017 CT abdomen--wall thickening of the distal esophagus likely secondary to erosive esophagitis, status post cholecystectomy, negative colon, negative CBD  Uncontrolled hypertension -Continue transdermal clonidine-->increase to 0.3 mg -Holding carvedilol and amlodipine secondary to intractable vomiting-->try to restart po  -hydralazine 10 mg prn SBP >180  Acute on chronic renal failure--CKD stage V -Baseline creatinine 4.1-4.5 -Serum creatinine peaked at 8.01 -Continue IV fluids -due to volume depletion -Right AV fistula placed 04/09/2017  Opioid dependence -Patient was on buprenorphine prior to admission, last filled 10/15/17--one week supply -The [place] Controlled Substance Reporting System has been queried for this patient for the past 12 months -He will need to follow-up with his provider after discharge  Diabetes mellitus type 1, uncontrolled -start Lantus when DKA resolved  Tobacco abuse -Tobacco cessation discussed  Chronic abdominal pain -secondary to his gastroparesis  Anemia of CKD -Baseline hemoglobin9-10    Disposition Plan:   Home in 2-3 days  Family Communication:   Family at bedside  Consultants:  Nephrology, urology  Code Status:  FULL  DVT Prophylaxis:  [place] Heparin    Procedures: As Listed in Progress Note Above  Antibiotics: vanco 9/10>>> Ceftriaxone 9/10>>> Doxy 9/11>>>  The patient is critically ill with multiple organ systems failure and requires high complexity decision making for assessment and support, frequent evaluation and titration of therapies, application of advanced monitoring technologies and extensive interpretation of multiple  databases.  Critical care time - 55mins. In addition to  the time spent by Dr. Tennis Must     Subjective: Patient continues to complain of bilateral scrotal pain and suprapubic pain that is moderate to severe.  He states it is not much improved.  He continues to complain of scrotal edema.  He denies any headache, chest pain, shortness breath, abdominal pain.  He has nausea and vomiting.  He is able to urinate spontaneously.  Objective: Vitals:   10/17/17 0400 10/17/17 0430 10/17/17 0500 10/17/17 0600  BP:  (!) 153/81 138/75 (!) 187/96  Pulse: (!) 114 (!) 104 98 99  Resp: 15 13 18 13   Temp: 98.6 F (37 C)     TempSrc: Oral     SpO2: 96% 92% 95% 98%  Weight: 88 kg     Height:        Intake/Output Summary (Last 24 hours) at 10/17/2017 0732 Last data filed at 10/17/2017 0600 Gross per 24 hour  Intake 1376.97 ml  Output 1600 ml  Net -223.03 ml   Weight change:  Exam:   General:  Pt is alert, follows commands appropriately, not in acute distress  HEENT: No icterus, No thrush, No neck mass, [place]/AT  Cardiovascular: RRR, S1/S2, no rubs, no gallops  Respiratory: Bibasilar crackles.  No wheezing.  Good air movement.  Abdomen: Soft/+BS, non tender, non distended, no guarding  Extremities: No edema, No lymphangitis, No petechiae, No rashes, no synovitis  -Bilateral scrotal edema without necrosis or open draining wound.  There is no crepitance.  There is suprapubic induration particularly in the area of the right inguinal canal.   Data Reviewed: I have personally reviewed following labs and imaging studies Basic Metabolic Panel: Recent Labs  Lab 10/16/17 1250 10/16/17 2100 10/16/17 2313 10/17/17 0342  NA 127*  --  123* 127*  K 4.7  --  4.9 4.7  CL 89*  --  84* 89*  CO2 23  --  13* 22  GLUCOSE 333* 796* 832* 596*  BUN 53*  --  60* 59*  CREATININE 7.79*  --  8.01* 7.86*  CALCIUM 9.3  --  8.7* 8.9   Liver Function Tests: Recent Labs  Lab 10/16/17 1250  AST 18  ALT  7  ALKPHOS 88  BILITOT 0.8  PROT 7.8  ALBUMIN 3.7   No results for input(s): LIPASE, AMYLASE in the last 168 hours. No results for input(s): AMMONIA in the last 168 hours. Coagulation Profile: No results for input(s): INR, PROTIME in the last 168 hours. CBC: Recent Labs  Lab 10/16/17 1250 10/17/17 0342  WBC 28.5* 28.5*  NEUTROABS 25.4*  --   HGB 8.5* 8.5*  HCT 25.8* 26.1*  MCV 89.9 90.3  PLT 350 356   Cardiac Enzymes: No results for input(s): CKTOTAL, CKMB, CKMBINDEX, TROPONINI in the last 168 hours. BNP: Invalid input(s): POCBNP CBG: Recent Labs  Lab 10/17/17 0226 10/17/17 0326 10/17/17 0430 10/17/17 0537 10/17/17 0630  GLUCAP 553* 573* 538* 417* 425*   HbA1C: No results for input(s): HGBA1C in the last 72 hours. Urine analysis:    Component Value Date/Time   COLORURINE YELLOW 10/17/2017 0125   APPEARANCEUR HAZY (A) 10/17/2017 0125   LABSPEC 1.014 10/17/2017 0125   PHURINE 5.0 10/17/2017 0125   GLUCOSEU >=500 (A) 10/17/2017 0125   HGBUR MODERATE (A) 10/17/2017 0125   BILIRUBINUR NEGATIVE 10/17/2017 0125   KETONESUR 20 (A) 10/17/2017 0125   PROTEINUR 100 (A) 10/17/2017 0125   UROBILINOGEN 1.0 11/13/2014 0155   NITRITE NEGATIVE 10/17/2017 0125   LEUKOCYTESUR NEGATIVE 10/17/2017  0125   Sepsis Labs: @LABRCNTIP (procalcitonin:4,lacticidven:4) ) Recent Results (from the past 240 hour(s))  Blood Culture (routine x 2)     Status: None (Preliminary result)   Collection Time: 10/16/17 12:50 PM  Result Value Ref Range Status   Specimen Description BLOOD LEFT ANTECUBITAL  Final   Special Requests   Final    BOTTLES DRAWN AEROBIC AND ANAEROBIC Blood Culture results may not be optimal due to an inadequate volume of blood received in culture bottles Performed at [facility] [address], [place], [place] [address]    Culture PENDING  Incomplete   Report Status PENDING  Incomplete  Blood Culture (routine x 2)     Status: None (Preliminary result)   Collection  Time: 10/16/17  9:07 PM  Result Value Ref Range Status   Specimen Description LEFT ANTECUBITAL  Final   Special Requests   Final    BOTTLES DRAWN AEROBIC AND ANAEROBIC Blood Culture adequate volume Performed at [facility], [address], [place], [place] [address]    Culture PENDING  Incomplete   Report Status PENDING  Incomplete     Scheduled Meds: . amLODipine  10 mg Oral Daily  . buprenorphine-naloxone  1 tablet Sublingual BID  . carvedilol  25 mg Oral BID WC  . [START ON 10/19/2017] cloNIDine  0.3 mg Transdermal Weekly  . heparin  5,000 Units Subcutaneous Q8H  . ondansetron (ZOFRAN) IV  4 mg Intravenous Once  . ondansetron  8 mg Oral Once  . pantoprazole  40 mg Oral Daily   Continuous Infusions: . cefTRIAXone (ROCEPHIN)  IV Stopped (10/16/17 1335)  . dextrose 5 % and 0.45% NaCl    . insulin (NOVOLIN-R) infusion 14.3 mL/hr at 10/17/17 0600  . [START ON 10/18/2017] vancomycin      Procedures/Studies: Ct Abdomen Pelvis Wo Contrast  Result Date: 10/16/2017 CLINICAL DATA:  Four-day history of pain, erythema and swelling involving the RIGHT hemiscrotum, associated with fever. Current history of end-stage renal disease for which the patient had a an AV fistula placed on 04/09/2017. Surgical history includes cholecystectomy. EXAM: CT ABDOMEN AND PELVIS WITHOUT CONTRAST TECHNIQUE: Multidetector CT imaging of the abdomen and pelvis was performed following the standard protocol without IV contrast. COMPARISON:  07/10/2017, 05/29/2017 and earlier. FINDINGS: Lower chest: Minimal linear scar/atelectasis in the lingula and both lower lobes. Visualized lung bases otherwise clear. Resolution of RIGHT LOWER LOBE pneumonia since 07/10/2017. Heart mildly enlarged with RIGHT coronary artery atherosclerosis. Hepatobiliary: Normal unenhanced appearance of the liver. Surgically absent gallbladder. No unexpected biliary ductal dilation. Pancreas: Normal unenhanced appearance. Spleen: Normal unenhanced  appearance. Adrenals/Urinary Tract: Normal appearing adrenal glands. No evidence of urinary tract calculi or obstruction on either side. Within the limits of the unenhanced technique, no focal parenchymal abnormality involving either kidney. Normal appearing urinary bladder. Stomach/Bowel: Thickening of the wall of the distal esophagus to the level of the EG junction as noted previously. Normal appearing decompressed stomach. Normal-appearing small bowel. Normal-appearing colon with expected stool burden. Normal appendix in the RIGHT UPPER pelvis. Vascular/Lymphatic: Moderate aorto-iliofemoral atherosclerosis without evidence of aneurysm. Enlarged LEFT periaortic retroperitoneal lymph nodes, the largest node measuring approximately 2.3 x 2.1 cm (series 2, image 34), increased in size since the most recent prior CT 07/10/2017 where it measured 1.8 x 1.7 cm at the same level. Multiple upper normal size to slightly enlarged BILATERAL superficial inguinal lymph nodes, increased in size since the most recent prior CT. Reproductive: Prostate gland and seminal vesicles normal in size and appearance for  age. Extensive edema/inflammation involving the RIGHT hemiscrotum with marked skin thickening and a likely fluid collection within the thickened skin measuring approximately 1.6 cm (series 3, image 18). Mild edema/inflammation involving the contralateral LEFT hemiscrotum. Large BILATERAL hydroceles. No evidence of gas within the scrotal tissues. Other: None. Musculoskeletal: Large Schmorl's node in the UPPER endplate of J62 with mild compression deformity of the UPPER endplate of G31, unchanged. No new/acute findings. IMPRESSION: 1. Edema/inflammation involving the RIGHT hemiscrotum with marked skin thickening and a likely abscess within the thickened skin. Edema/inflammation to a lesser degree involves the contralateral LEFT hemiscrotum. Large BILATERAL hydroceles are present. No evidence of gas within the scrotal tissues  to suggest Fournier's gangrene. Scrotal ultrasound is recommended in further evaluation to better delineate these findings. 2. Enlarging LEFT periaortic lymph nodes and BILATERAL superficial inguinal lymph nodes since the most recent prior CT 3 months ago. While these are statistically likely reactive, a low-grade lymphoproliferative disorder could have a similar appearance. 3. Stable marked thickening of the wall of the visualized distal esophagus to the level of the EG junction dating back to at least 2017, likely chronic esophagitis. 4.  Aortic Atherosclerosis, advanced for patient age.  (ICD10-170.0) Electronically Signed   By: Evangeline Dakin M.D.   On: 10/16/2017 15:10   US Scrotum W/doppler  Result Date: 10/16/2017 CLINICAL DATA:  Right-sided pain with edema and redness over the last 4 days EXAM: SCROTAL ULTRASOUND DOPPLER ULTRASOUND OF THE TESTICLES TECHNIQUE: Complete ultrasound examination of the testicles, epididymis, and other scrotal structures was performed. Color and spectral Doppler ultrasound were also utilized to evaluate blood flow to the testicles. COMPARISON:  CT abdomen pelvis of 10/16/2016 FINDINGS: Right testicle Measurements: The right testicle measures 3.9 x 2.4 x 2.6 cm. No intratesticular abnormality is seen. There is blood flow to the right testicle with arterial and venous waveforms. Left testicle Measurements: The left testicle measures 4.2 x 2.7 x 2.6 cm. No intratesticular abnormality is seen. Blood flow is demonstrated to the left testicle with arterial and venous waveforms. Right epididymis:  The right epididymis is unremarkable. Left epididymis: The left epididymis appears enlarged and inhomogeneous as well as hypervascular suggesting left epididymitis. Hydrocele: There is a small right hydrocele present which appears partially septated. Varicocele:  No varicocele is seen. Pulsed Doppler interrogation of both testes demonstrates normal low resistance arterial and venous  waveforms bilaterally. There is diffuse scrotal edema and skin thickening present. No discrete abscess is detected. IMPRESSION: 1. Diffusely edematous scrotal wall.  No abscess. 2. Irregular and enlarged left epididymis with hypervascularity suggestive of left epididymitis. 3. No intratesticular abnormality is seen. Blood flow is demonstrated to both testicles with arterial and venous waveforms. 4. Small right hydrocele which appears septated. Electronically Signed   By: Ivar Drape M.D.   On: 10/16/2017 16:22    Orson Eva, DO  [facility] Pager [phone]  If 7PM-7AM, please contact night-coverage [email or web address] Password [email or web address] 10/17/2017, 7:32 AM   LOS: 1 day

## 2017-10-17 NOTE — Progress Notes (Signed)
Night shift floor coverage note.  The patient was recently admitted due to cellulitis and worsening renal failure.  Please see H&P done by Dr. Jani Gravel.  He denies headache, chest pain, but complaining of epigastric pain.    Most recent vital signs temperature 98.6 F, pulse 116, respirations 20, blood pressure 164/77 mmHg and O2 sat 100% on room air.  Oral mucose looks mildly dry, neck is supple, lungs CTA, heart S1-S2 RRR 104 bpm, abdomen mildly distended with epigastric tenderness, but no guarding or rebound.  He has left forearm dressing.  There is no lower extremity edema.  He is awake, alert and oriented x3.  Basic metabolic panel [[id number]] (Abnormal)   Collected: 10/16/17 2313   Updated: 10/16/17 2354   Specimen Type: Blood    Sodium 123Low  mmol/L   Potassium 4.9 mmol/L   Chloride 84Low  mmol/L   CO2 13Low  mmol/L   Glucose, Bld 832High Panic   mg/dL   BUN 60High  mg/dL   Creatinine, Ser 8.01High  mg/dL   Calcium 8.7Low  mg/dL   GFR calc non Af Amer 8Low  mL/min   GFR calc Af Amer 9Low  mL/min   Anion gap 26High    DKA (diabetic ketoacidosis) (HCC)  He was given 15 units of insulin IV earlier.  Another 5 units bolus was ordered.  He started on a continuous insulin infusion and transferred to ICU due to worsening hyperglycemia and widening anion gap.  CBG, electrolyte monitoring along with careful IV hydration, given renal insufficiency.   Will continue to monitor. Continue IV antibiotics for cellulitis and other meds.  About 40 minutes of critical care time were used during this emergent event.  Tennis Must, MD  This document was prepared using Dragon voice recognition software and may contain some unintended transcription errors.

## 2017-10-17 NOTE — Consult Note (Signed)
Brent Daniels MRN: [id number] DOB/AGE: [birth year] 32 y.o. Primary Care Physician:Vyas, Costella Hatcher, MD Admit date: 10/16/2017 Chief Complaint:  Chief Complaint  Patient presents with  . Abscess   HPI: Pt is a 32 year old male with past medical hx of   chronic kidney disease, type 1 diabetes mellitus, poorly controlled  Who was came to emergency department with c/o abscess  HPI dates back to 3-4 days when pt started  pain and swelling about his scrotum. .  At the onset it was palpable indurated area in his posterior scrotum. Since then  pain has been progressively worseing , severe, incapacitating, worse with motion, ambulation, palpation. Pt also c/ o swelling and erythema which has  increased substantially, and now the patient has concern of distended scrotum, pain throughout his suprapubic region, as well as new fever, nausea. Pt  c/o change in apptitte Pt C/o malaise. Pt c/o lack of energy. Pt offers no other concerns.     Past Medical History:  Diagnosis Date  . Acute esophagitis   . Anemia   . Anxiety   . Arthritis   . Bicuspid aortic valve    noted on 10/20/16 T[facility])  . Chronic abdominal pain   . Chronic back pain   . Chronic kidney disease    Stage 5 Kidney disease  . Depression   . Diabetes mellitus    type 1  . Diabetic gastroparesis associated with type 1 diabetes mellitus ([place])   . Erosive esophagitis 12/23/2013   "severe" per EGD   . Gastroparesis   . GERD (gastroesophageal reflux disease)   . Headache    in the past  . Heart murmur    mild TR, mild MR, mild AS 02/19/17  . History of hiatal hernia   . Hypercholesteremia   . Hypertension   . Insomnia   . Nausea and vomiting    chronic, recurrent  . Neuropathy   . Pneumonia   . PUD (peptic ulcer disease)   . S/P arthroscopic knee surgery 07/04/2011  . Scoliosis 07/11/2012        Family History  Problem Relation Age of Onset  . Cancer Father   . Alcohol abuse Father   . Diabetes Father   .  Diabetes Paternal Grandfather   . Pseudochol deficiency Neg Hx   . Malignant hyperthermia Neg Hx   . Hypotension Neg Hx   . Anesthesia problems Neg Hx   . Colon cancer Neg Hx     Social History:  reports that he has been smoking cigarettes. He has a 2.50 pack-year smoking history. He quit smokeless tobacco use about 4 years ago. He reports that he does not drink alcohol or use drugs.   Allergies:  Allergies  Allergen Reactions  . Nsaids Nausea And Vomiting  . Tramadol Anaphylaxis, Other (See Comments) and Nausea Only    Acid Reflux Acid Reflux  . Ibuprofen Other (See Comments) and Nausea And Vomiting    Stomach ulcers  . Morphine And Related Rash and Hives  . Naproxen Other (See Comments)    Stomach ulcers Stomach ulcers  . Sulfa Antibiotics Rash and Other (See Comments)    Stomach ulcers   . Hydrocodone Bitartrate     Anaphylaxis Can take plain Tylenol  . Ketorolac Other (See Comments)    Unknown    Medications Prior to Admission  Medication Sig Dispense Refill  . amLODipine (NORVASC) 10 MG tablet Take 10 mg by mouth daily.     Marland Kitchen  BAYER MICROLET LANCETS lancets Use as instructed to test sugar 5 times daily 500 each 2  . Buprenorphine HCl-Naloxone HCl 8-2 MG FILM Place 1 Film under the tongue 2 (two) times daily.    . carvedilol (COREG) 25 MG tablet Take 1 tablet (25 mg total) by mouth 2 (two) times daily with a meal. 60 tablet 1  . cloNIDine (CATAPRES - DOSED IN MG/24 HR) 0.3 mg/24hr patch Place 1 patch (0.3 mg total) onto the skin once a week. Change on Friday's 4 patch 1  . Continuous Blood Gluc Receiver (FREESTYLE LIBRE 14 DAY READER) DEVI 1 each by Does not apply route every 14 (fourteen) days. 1 Device 0  . furosemide (LASIX) 40 MG tablet Take 40 mg by mouth daily.   1  . glucose blood (CONTOUR NEXT TEST) test strip Use as instructed to test sugar 5 times daily 500 each 0  . Insulin Glargine (BASAGLAR KWIKPEN) 100 UNIT/ML SOPN Inject 6 Units into the skin 2 (two)  times daily.    . insulin lispro (HUMALOG KWIKPEN) 100 UNIT/ML KiwkPen 10 units ac tid (Patient taking differently: Inject 1-15 Units into the skin 3 (three) times daily. Based on sliding scale) 27 mL 1  . pantoprazole (PROTONIX) 40 MG tablet Take 1 tablet (40 mg total) by mouth 2 (two) times daily before a meal. (Patient taking differently: Take 40 mg by mouth daily. ) 60 tablet 0  . promethazine (PHENERGAN) 25 MG tablet Take 25 mg by mouth 3 (three) times daily as needed for nausea or vomiting.   1  . tiZANidine (ZANAFLEX) 4 MG tablet Take 2 mg by mouth every 6 (six) hours as needed for muscle spasms.     . calcitRIOL (ROCALTROL) 0.25 MCG capsule Take 1 capsule (0.25 mcg total) by mouth daily. (Patient not taking: Reported on 10/16/2017) 30 capsule 1  . calcium acetate (PHOSLO) 667 MG capsule Take 667 mg by mouth 3 (three) times daily with meals.  3  . gabapentin (NEURONTIN) 300 MG capsule Take 1 capsule (300 mg total) by mouth at bedtime. (Patient not taking: Reported on 10/16/2017)         VOH:YWVPX from the symptoms mentioned above,there are no other symptoms referable to all systems reviewed.  Marland Kitchen amLODipine  10 mg Oral Daily  . buprenorphine-naloxone  1 tablet Sublingual BID  . carvedilol  25 mg Oral BID WC  . [START ON 10/19/2017] cloNIDine  0.3 mg Transdermal Weekly  . heparin  5,000 Units Subcutaneous Q8H  . metoCLOPramide (REGLAN) injection  5 mg Intravenous Q8H  . ondansetron (ZOFRAN) IV  4 mg Intravenous Q6H  . pantoprazole  40 mg Oral Daily      Physical Exam: Vital signs in last 24 hours: Temp:  [98 F (36.7 C)-100.9 F (38.3 C)] 100.9 F (38.3 C) (09/11 1100) Pulse Rate:  [96-131] 105 (09/11 1200) Resp:  [13-29] 14 (09/11 1500) BP: (133-200)/(75-125) 183/93 (09/11 1500) SpO2:  [89 %-100 %] 91 % (09/11 1200) Weight:  [86.7 kg-88 kg] 88 kg (09/11 0400) Weight change:     Intake/Output from previous day: 09/10 0701 - 09/11 0700 In: 1377 [I.V.:579.5; IV  Piggyback:797.5] Out: 1600 [Urine:1600] Total I/O In: 455.5 [I.V.:95.8; IV Piggyback:359.6] Out: -    Physical Exam: General- pt is awake,alert, oriented to time place and person Resp- No acute REsp distress, CTA B/L NO Rhonchi CVS- S1S2 regular in rate and rhythm GIT- BS+, soft, Non Distended EXT- 1+ LE Edema, NO Cyanosis CNS- CN 2-12 grossly  intact. Moving all 4 extremities Psych- normal mood and affect Acces- AVF +   Lab Results: CBC Recent Labs    10/16/17 1250 10/17/17 0342  WBC 28.5* 28.5*  HGB 8.5* 8.5*  HCT 25.8* 26.1*  PLT 350 356    BMET Recent Labs    10/17/17 0342 10/17/17 0850  NA 127* 135  K 4.7 4.8  CL 89* 100  CO2 22 17*  GLUCOSE 596* 250*  BUN 59* 60*  CREATININE 7.86* 7.41*  CALCIUM 8.9 9.2    Creat trend 2019  7.4--8.0 2018  3.2--6.37 September at [place]   4.9--5.7 July 3.2--4.6 2016  1.3           MICRO Recent Results (from the past 240 hour(s))  Blood Culture (routine x 2)     Status: None (Preliminary result)   Collection Time: 10/16/17 12:50 PM  Result Value Ref Range Status   Specimen Description BLOOD LEFT ANTECUBITAL  Final   Special Requests   Final    BOTTLES DRAWN AEROBIC AND ANAEROBIC Blood Culture results may not be optimal due to an inadequate volume of blood received in culture bottles   Culture   Final    NO GROWTH < 24 HOURS Performed at [facility], [address] [place], [place] [address]    Report Status PENDING  Incomplete  Blood Culture (routine x 2)     Status: None (Preliminary result)   Collection Time: 10/16/17  9:07 PM  Result Value Ref Range Status   Specimen Description LEFT ANTECUBITAL  Final   Special Requests   Final    BOTTLES DRAWN AEROBIC AND ANAEROBIC Blood Culture adequate volume   Culture   Final    NO GROWTH < 24 HOURS Performed at [facility], [address] [place], [place] [address]    Report Status PENDING  Incomplete  MRSA PCR Screening     Status: None   Collection  Time: 10/17/17  4:37 AM  Result Value Ref Range Status   MRSA by PCR NEGATIVE NEGATIVE Final    Comment:        The GeneXpert MRSA Assay (FDA approved for NASAL specimens only), is one component of a comprehensive MRSA colonization surveillance program. It is not intended to diagnose MRSA infection nor to guide or monitor treatment for MRSA infections. Performed at [facility], [address] [place], [place] [address]       Lab Results  Component Value Date   PTH 125 (H) 11/16/2016   PTH Comment 11/16/2016   CALCIUM 9.2 10/17/2017   CAION 1.15 03/31/2014   CAION 1.15 03/31/2014   PHOS 2.1 (L) 11/19/2016      Impression: 1)Renal  AKI secondary to Prerenal/ATN                vs  CKD progression               AKI on CKD               CKD stage 5.               CKD since 2016               CKD secondary to DM 1                Progression of CKD                  as expected for uncontrolled DM 1-Pt with 22 years  of DM                 multiple AKI                Proteinura Present.              2)HTN  Medication- On Calcium Channel Blockers On Beta blockers   3)Anemia HGb at goal (9--11)  4)CKD Mineral-Bone Disorder PTH High Secondary Hyperparathyroidism present Phosphorus not at goal. Calcium- at goal  5)GU- admitted with scrotal swelling Urology and  Primary MD following  6)Electrolytes Normokalemic   Hypoonatremic   Hyperglycemia    Now better   7)Acid base Co2 not  at goal Low chloride NON AG acidosis from CKD   8) Id-admitted with possible sepsis        On broad spectrum abx          Plan:  Agree with current and plan Will follow bmet I educated pt about need for renal replacement therapy but pt says not today.     Anacarolina Evelyn S 10/17/2017, 4:16 PM

## 2017-10-18 DIAGNOSIS — N170 Acute kidney failure with tubular necrosis: Secondary | ICD-10-CM

## 2017-10-18 DIAGNOSIS — N184 Chronic kidney disease, stage 4 (severe): Secondary | ICD-10-CM

## 2017-10-18 LAB — BASIC METABOLIC PANEL
ANION GAP: 14 (ref 5–15)
ANION GAP: 15 (ref 5–15)
Anion gap: 19 — ABNORMAL HIGH (ref 5–15)
BUN: 55 mg/dL — ABNORMAL HIGH (ref 6–20)
BUN: 58 mg/dL — AB (ref 6–20)
BUN: 58 mg/dL — ABNORMAL HIGH (ref 6–20)
CALCIUM: 8.9 mg/dL (ref 8.9–10.3)
CALCIUM: 8.9 mg/dL (ref 8.9–10.3)
CO2: 20 mmol/L — ABNORMAL LOW (ref 22–32)
CO2: 22 mmol/L (ref 22–32)
CO2: 22 mmol/L (ref 22–32)
CREATININE: 6.74 mg/dL — AB (ref 0.61–1.24)
Calcium: 9.1 mg/dL (ref 8.9–10.3)
Chloride: 94 mmol/L — ABNORMAL LOW (ref 98–111)
Chloride: 94 mmol/L — ABNORMAL LOW (ref 98–111)
Chloride: 98 mmol/L (ref 98–111)
Creatinine, Ser: 6.95 mg/dL — ABNORMAL HIGH (ref 0.61–1.24)
Creatinine, Ser: 7.01 mg/dL — ABNORMAL HIGH (ref 0.61–1.24)
GFR calc Af Amer: 11 mL/min — ABNORMAL LOW (ref >60)
GFR calc Af Amer: 11 mL/min — ABNORMAL LOW (ref >60)
GFR calc non Af Amer: 9 mL/min — ABNORMAL LOW (ref >60)
GFR calc non Af Amer: 10 mL/min — ABNORMAL LOW (ref >60)
GFR, EST AFRICAN AMERICAN: 11 mL/min — AB (ref >60)
GFR, EST NON AFRICAN AMERICAN: 9 mL/min — AB (ref >60)
GLUCOSE: 364 mg/dL — AB (ref 70–99)
Glucose, Bld: 138 mg/dL — ABNORMAL HIGH (ref 70–99)
Glucose, Bld: 423 mg/dL — ABNORMAL HIGH (ref 70–99)
Potassium: 3.8 mmol/L (ref 3.5–5.1)
Potassium: 4.2 mmol/L (ref 3.5–5.1)
Potassium: 4.3 mmol/L (ref 3.5–5.1)
SODIUM: 134 mmol/L — AB (ref 135–145)
Sodium: 133 mmol/L — ABNORMAL LOW (ref 135–145)
Sodium: 131 mmol/L — ABNORMAL LOW (ref 135–145)

## 2017-10-18 LAB — GLUCOSE, CAPILLARY
GLUCOSE-CAPILLARY: 444 mg/dL — AB (ref 70–99)
Glucose-Capillary: 125 mg/dL — ABNORMAL HIGH (ref 70–99)
Glucose-Capillary: 222 mg/dL — ABNORMAL HIGH (ref 70–99)
Glucose-Capillary: 260 mg/dL — ABNORMAL HIGH (ref 70–99)
Glucose-Capillary: 450 mg/dL — ABNORMAL HIGH (ref 70–99)

## 2017-10-18 MED ORDER — SODIUM CHLORIDE 0.9% FLUSH: 10.0000 mL | INTRAVENOUS | Status: DC | PRN

## 2017-10-18 MED ORDER — INSULIN GLARGINE 100 UNIT/ML ~~LOC~~ SOLN
10.0000 [IU] | Freq: Two times a day (BID) | SUBCUTANEOUS | Status: DC
  Administered 2017-10-18 – 2017-10-19 (×2): 10 [IU] via SUBCUTANEOUS
  Filled 2017-10-18 (×8): qty 0.1

## 2017-10-18 MED ORDER — INSULIN ASPART 100 UNIT/ML ~~LOC~~ SOLN
0.0000 [IU] | Freq: Every day | SUBCUTANEOUS | Status: DC
  Administered 2017-10-18: 2 [IU] via SUBCUTANEOUS

## 2017-10-18 MED ORDER — CHLORHEXIDINE GLUCONATE CLOTH 2 % EX PADS
6.0000 | MEDICATED_PAD | Freq: Every day | CUTANEOUS | Status: DC
  Administered 2017-10-20: 6 via TOPICAL

## 2017-10-18 MED ORDER — INSULIN ASPART 100 UNIT/ML ~~LOC~~ SOLN
0.0000 [IU] | Freq: Three times a day (TID) | SUBCUTANEOUS | Status: DC
  Administered 2017-10-18: 15 [IU] via SUBCUTANEOUS

## 2017-10-18 MED ORDER — INSULIN ASPART 100 UNIT/ML ~~LOC~~ SOLN
20.0000 [IU] | Freq: Once | SUBCUTANEOUS | Status: AC
  Administered 2017-10-18: 20 [IU] via SUBCUTANEOUS

## 2017-10-18 MED ORDER — SODIUM CHLORIDE 0.9% FLUSH
10.0000 mL | Freq: Two times a day (BID) | INTRAVENOUS | Status: DC
  Administered 2017-10-18 – 2017-10-19 (×2): 10 mL

## 2017-10-18 MED ORDER — DOXYCYCLINE HYCLATE 100 MG PO TABS
100.0000 mg | ORAL_TABLET | Freq: Two times a day (BID) | ORAL | Status: DC
  Administered 2017-10-18 – 2017-10-20 (×5): 100 mg via ORAL
  Filled 2017-10-18 (×5): qty 1

## 2017-10-18 MED ORDER — SODIUM CHLORIDE 0.9 % IV SOLN
INTRAVENOUS | Status: DC | PRN
  Administered 2017-10-18: 500 mL via INTRAVENOUS

## 2017-10-18 MED ORDER — INSULIN ASPART 100 UNIT/ML ~~LOC~~ SOLN
0.0000 [IU] | Freq: Three times a day (TID) | SUBCUTANEOUS | Status: DC
  Administered 2017-10-18: 8 [IU] via SUBCUTANEOUS
  Administered 2017-10-19: 11 [IU] via SUBCUTANEOUS

## 2017-10-18 MED ORDER — CLONIDINE HCL 0.3 MG/24HR TD PTWK
MEDICATED_PATCH | TRANSDERMAL | Status: AC
  Filled 2017-10-18: qty 1

## 2017-10-18 MED ORDER — SODIUM CHLORIDE 0.9 % IV SOLN: INTRAVENOUS | Status: DC

## 2017-10-18 MED ORDER — INSULIN GLARGINE 100 UNIT/ML ~~LOC~~ SOLN
10.0000 [IU] | Freq: Once | SUBCUTANEOUS | Status: AC
  Administered 2017-10-18: 10 [IU] via SUBCUTANEOUS
  Filled 2017-10-18 (×2): qty 0.1

## 2017-10-18 MED ORDER — CARVEDILOL 12.5 MG PO TABS
25.0000 mg | ORAL_TABLET | Freq: Once | ORAL | Status: AC
  Administered 2017-10-18: 25 mg via ORAL

## 2017-10-18 MED ORDER — CARVEDILOL 12.5 MG PO TABS
ORAL_TABLET | ORAL | Status: AC
  Filled 2017-10-18: qty 2

## 2017-10-18 MED ORDER — SODIUM CHLORIDE 0.9 % IV SOLN
INTRAVENOUS | Status: DC
  Administered 2017-10-18 – 2017-10-21 (×10): via INTRAVENOUS

## 2017-10-18 NOTE — Progress Notes (Signed)
Mid level notified of pt cbgs and last bmp results. New orders received.

## 2017-10-18 NOTE — Progress Notes (Signed)
Pt standing in doorway  naked holding scrotum and had removed foley bag but catheter still in place. Pt had also unhooked iv from femoral line.Pt complaining that foley was hurting and demanding to have it out. Staff encouraged pt to get back into bed. Explained he should not be out of bed with femoral line in place.  Instructed pt that if he had any more difficulties in urinating, foley may need to be replaced. Pt yelled out understanding foley removal consequences  but continued to demand foley be removed.  Removed foley with tip intact.  Pt yelling out he had to urinate afterwards. Gave  urinal to pt and explained he would not be able to get out of bed and he would have to use urinal in bed.  Pt refused to allow staff to replace monitor cables but would allow to ivf to femoral line.  Elink RN made aware.  Bed alarm on. Continue to monitor.

## 2017-10-18 NOTE — Progress Notes (Signed)
Subjective: Interval History: has complaints some nausea and vomiting.  Denies any difficulty breathing.  He does not feel good..  Objective: Vital signs in last 24 hours: Temp:  [99.1 F (37.3 C)-101.8 F (38.8 C)] 99.1 F (37.3 C) (09/12 0800) Pulse Rate:  [105-129] 111 (09/12 0600) Resp:  [13-29] 19 (09/12 0600) BP: (132-207)/(76-103) 189/94 (09/12 0500) SpO2:  [88 %-97 %] 96 % (09/12 0600) Weight:  [87.9 kg] 87.9 kg (09/12 0500) Weight change: -1.912 kg  Intake/Output from previous day: 09/11 0701 - 09/12 0700 In: 775 [I.V.:165.3; IV Piggyback:609.6] Out: 1700 [Urine:1700] Intake/Output this shift: No intake/output data recorded.  General appearance: alert, cooperative and no distress Resp: clear to auscultation bilaterally Cardio: regular rate and rhythm Extremities: No edema  Lab Results: Recent Labs    10/16/17 1250 10/17/17 0342  WBC 28.5* 28.5*  HGB 8.5* 8.5*  HCT 25.8* 26.1*  PLT 350 356   BMET:  Recent Labs    10/18/17 0002 10/18/17 0536  NA 134* 133*  K 3.8 4.3  CL 98 94*  CO2 22 20*  GLUCOSE 138* 423*  BUN 55* 58*  CREATININE 7.01* 6.95*  CALCIUM 8.9 9.1   No results for input(s): PTH in the last 72 hours. Iron Studies: No results for input(s): IRON, TIBC, TRANSFERRIN, FERRITIN in the last 72 hours.  Studies/Results: Ct Abdomen Pelvis Wo Contrast  Result Date: 10/16/2017 CLINICAL DATA:  Four-day history of pain, erythema and swelling involving the RIGHT hemiscrotum, associated with fever. Current history of end-stage renal disease for which the patient had a an AV fistula placed on 04/09/2017. Surgical history includes cholecystectomy. EXAM: CT ABDOMEN AND PELVIS WITHOUT CONTRAST TECHNIQUE: Multidetector CT imaging of the abdomen and pelvis was performed following the standard protocol without IV contrast. COMPARISON:  07/10/2017, 05/29/2017 and earlier. FINDINGS: Lower chest: Minimal linear scar/atelectasis in the lingula and both lower lobes.  Visualized lung bases otherwise clear. Resolution of RIGHT LOWER LOBE pneumonia since 07/10/2017. Heart mildly enlarged with RIGHT coronary artery atherosclerosis. Hepatobiliary: Normal unenhanced appearance of the liver. Surgically absent gallbladder. No unexpected biliary ductal dilation. Pancreas: Normal unenhanced appearance. Spleen: Normal unenhanced appearance. Adrenals/Urinary Tract: Normal appearing adrenal glands. No evidence of urinary tract calculi or obstruction on either side. Within the limits of the unenhanced technique, no focal parenchymal abnormality involving either kidney. Normal appearing urinary bladder. Stomach/Bowel: Thickening of the wall of the distal esophagus to the level of the EG junction as noted previously. Normal appearing decompressed stomach. Normal-appearing small bowel. Normal-appearing colon with expected stool burden. Normal appendix in the RIGHT UPPER pelvis. Vascular/Lymphatic: Moderate aorto-iliofemoral atherosclerosis without evidence of aneurysm. Enlarged LEFT periaortic retroperitoneal lymph nodes, the largest node measuring approximately 2.3 x 2.1 cm (series 2, image 34), increased in size since the most recent prior CT 07/10/2017 where it measured 1.8 x 1.7 cm at the same level. Multiple upper normal size to slightly enlarged BILATERAL superficial inguinal lymph nodes, increased in size since the most recent prior CT. Reproductive: Prostate gland and seminal vesicles normal in size and appearance for age. Extensive edema/inflammation involving the RIGHT hemiscrotum with marked skin thickening and a likely fluid collection within the thickened skin measuring approximately 1.6 cm (series 3, image 18). Mild edema/inflammation involving the contralateral LEFT hemiscrotum. Large BILATERAL hydroceles. No evidence of gas within the scrotal tissues. Other: None. Musculoskeletal: Large Schmorl's node in the UPPER endplate of Z36 with mild compression deformity of the UPPER  endplate of U44, unchanged. No new/acute findings. IMPRESSION: 1. Edema/inflammation involving the  RIGHT hemiscrotum with marked skin thickening and a likely abscess within the thickened skin. Edema/inflammation to a lesser degree involves the contralateral LEFT hemiscrotum. Large BILATERAL hydroceles are present. No evidence of gas within the scrotal tissues to suggest Fournier's gangrene. Scrotal ultrasound is recommended in further evaluation to better delineate these findings. 2. Enlarging LEFT periaortic lymph nodes and BILATERAL superficial inguinal lymph nodes since the most recent prior CT 3 months ago. While these are statistically likely reactive, a low-grade lymphoproliferative disorder could have a similar appearance. 3. Stable marked thickening of the wall of the visualized distal esophagus to the level of the EG junction dating back to at least 2017, likely chronic esophagitis. 4.  Aortic Atherosclerosis, advanced for patient age.  (ICD10-170.0) Electronically Signed   By: Evangeline Dakin M.D.   On: 10/16/2017 15:10   US Scrotum W/doppler  Result Date: 10/16/2017 CLINICAL DATA:  Right-sided pain with edema and redness over the last 4 days EXAM: SCROTAL ULTRASOUND DOPPLER ULTRASOUND OF THE TESTICLES TECHNIQUE: Complete ultrasound examination of the testicles, epididymis, and other scrotal structures was performed. Color and spectral Doppler ultrasound were also utilized to evaluate blood flow to the testicles. COMPARISON:  CT abdomen pelvis of 10/16/2016 FINDINGS: Right testicle Measurements: The right testicle measures 3.9 x 2.4 x 2.6 cm. No intratesticular abnormality is seen. There is blood flow to the right testicle with arterial and venous waveforms. Left testicle Measurements: The left testicle measures 4.2 x 2.7 x 2.6 cm. No intratesticular abnormality is seen. Blood flow is demonstrated to the left testicle with arterial and venous waveforms. Right epididymis:  The right epididymis is  unremarkable. Left epididymis: The left epididymis appears enlarged and inhomogeneous as well as hypervascular suggesting left epididymitis. Hydrocele: There is a small right hydrocele present which appears partially septated. Varicocele:  No varicocele is seen. Pulsed Doppler interrogation of both testes demonstrates normal low resistance arterial and venous waveforms bilaterally. There is diffuse scrotal edema and skin thickening present. No discrete abscess is detected. IMPRESSION: 1. Diffusely edematous scrotal wall.  No abscess. 2. Irregular and enlarged left epididymis with hypervascularity suggestive of left epididymitis. 3. No intratesticular abnormality is seen. Blood flow is demonstrated to both testicles with arterial and venous waveforms. 4. Small right hydrocele which appears septated. Electronically Signed   By: Ivar Drape M.D.   On: 10/16/2017 16:22    I have reviewed the patient's current medications.  Assessment/Plan: 1] acute kidney injury superimposed on chronic.  This is a recurrent issue.  Patient has been admitted multiple times with acute renal failure which improved with hydration.  Patient was uncontrolled diabetes, poor p.o. intake and develop recurrent prerenal syndrome versus ATN.  Presently he is nonoliguric he has 1700 cc of urine output and creatinine is better. 2] chronic renal failure: Stage IV.  This is due to hypertension.  His baseline creatinine is between 3-1/2-4. 3] diabetes: Poorly controlled 4] nausea and vomiting: Secondary to diabetic gastropathy and recurrent usual.  Patient has been evaluated by GI at the [place] hypertension 6] scrotal swelling: Ultrasound showed no abscess but possible left epididymitis.  Patient on antibiotics. 7] anemia: His hemoglobin is below target goal  Plan: Continue with hydration 2] we will check his renal panel and CBC in the morning 3] we will check iron studies tomorrow    LOS: 2 days   Neveen Daponte  S 10/18/2017,9:48 AM

## 2017-10-18 NOTE — Progress Notes (Signed)
Inpatient Diabetes Program Recommendations  AACE/ADA: New Consensus Statement on Inpatient Glycemic Control (2019)  Target Ranges:  Prepandial:   less than 140 mg/dL      Peak postprandial:   less than 180 mg/dL (1-2 hours)      Critically ill patients:  140 - 180 mg/dL  Results for Brent Daniels, Brent Daniels (MRN [id number]) as of 10/18/2017 11:12  Ref. Range 10/17/2017 13:36 10/17/2017 15:05 10/17/2017 16:27 10/17/2017 17:43 10/17/2017 19:02 10/17/2017 20:08 10/17/2017 21:09 10/17/2017 22:03 10/17/2017 23:01 10/18/2017 00:01 10/18/2017 07:36  Glucose-Capillary Latest Ref Range: 70 - 99 mg/dL 134 (H) 222 (H) 234 (H) 182 (H) 137 (H) 116 (H) 107 (H) 103 (H)  Lantus 5 units @22 :02 121 (H) 125 (H) 450 (H)  Novolog 20 units   Results for Brent Daniels, Brent Daniels (MRN [id number]) as of 10/17/2017 13:32  Ref. Range 10/17/2017 07:30 10/17/2017 08:31 10/17/2017 09:27 10/17/2017 10:40 10/17/2017 12:37  Glucose-Capillary Latest Ref Range: 70 - 99 mg/dL 340 (H) 228 (H) 206 (H) 159 (H) 104 (H)  Results for Brent Daniels, Brent Daniels (MRN [id number]) as of 10/17/2017 13:32  Ref. Range 10/16/2017 21:00 10/16/2017 23:13 10/17/2017 03:42  Glucose Latest Ref Range: 70 - 99 mg/dL 796 (HH) 832 (HH) 596 [facility])   Results for Brent Daniels, Brent Daniels (MRN [id number]) as of 10/17/2017 13:32  Ref. Range 09/19/2017 14:55  Hemoglobin A1C Latest Ref Range: 4.0 - 5.6 % 8.1 (A)    Review of Glycemic Control  Diabetes history: DM1 (makes NO insulin; requires insulin for basal, correction, and meal coverage) Outpatient Diabetes medications: Basaglar 6 units BID, Humalog 1-15 units TID (for meal coverage and correction) Current orders for Inpatient glycemic control: Lantus 10 units x 1(scheduled for 10 am today but not given yet), Novolog 0-15 units TID with meals  Inpatient Diabetes Program Recommendations: Insulin - Basal: Please consider ordering Lantus 10 units BID. Correction (SSI): Please consider decreasing Novolog correction to 0-9 units TID with meals and Novolog 0-5  units QHS. Insulin - Meal Coverage: Please consider ordering Novolog 3 units TID with meals for meal coverage if patient eats at least 50% of meals. HgbA1C: A1C 8.1% on 09/19/17 indicating an average glucose of 186 mg/dl.  NOTE: In reviewing chart, noted patient use to see Dr. Dorris Fetch but starting seeing Dr. Dwyane Dee (Endocrinologist) for DM management. Patient seen Dr. Dwyane Dee on 09/19/17 for initial appointment and patient was taking Lantus 20 units QHS, Regular TID with meals (Carb coverage 1:8 (1 unit to cover 8 grams of carbs) and Correction 1:40 mg/dl (1 unit drops glucose 40 mg/dl) over target glucose of 100 mg/dl.  DM medications were adjusted by Dr. Dwyane Dee on 09/19/17 to: "Split Lantus to twice a day. He will use 6 units twice a day and given him titration instructions to go up 1 unit every 3 to 4 days to keep before breakfast and suppertime readings at least under 150. Since he needs some more rapidly acting insulin he will switch Novolin R to NovoLog and he may be able to take this right after eating also. To change correction factor to 1: 40 instead of 30 to avoid overcorrection. Consider changing Lantus to Tyler Aas once he has finished his supply."  Patient received Lantus 5 units at 22:02 on 10/17/17 at time of transition off IV insulin drip. Glucose up to 450 mg/dl this morning. Noted order for one time Lantus 10 units this morning.  Patient received Novolog 20 units this morning at 9:45 am for correction of glucose 450 mg/dl. Patient will likely  require more basal insulin and is sensitive to insulin (1 unit drops glucose 30-40 mg/dl). Recommend ordering Lantus 10 units BID, decreasing Novolog correction scale to Novolog 0-9 units TID with meals and Novolog 0-5 units QHS, and adding Novolog 3 units TID with meals for meal coverage.  Thanks, Barnie Alderman, RN, MSN, CDE Diabetes Coordinator Inpatient Diabetes Program [phone] (Team Pager from 8am to 5pm)

## 2017-10-18 NOTE — Progress Notes (Signed)
PROGRESS NOTE  Brent Daniels MEQ:[id number] DOB: January 08, 1986 DOA: 10/16/2017 PCP: Glenda Chroman, MD Brief History8  32 year old male with a history of type 1 diabetes mellitus, diabetic gastroparesis, CKD stage V, tobacco abuse, chronic abdominal pain, erosive esophagitis, hypertension, GERD presenting with 3 to 4-day history of scrotal pain and swelling associated with suprapubic pain.  The patient states that he is able to urinate spontaneously.  He has had some subjective fevers and chills.  He states that he has had nausea and vomiting for the past 2 days.  He denies any hematemesis, hematochezia, melena.  The patient has a well-documented history of poor compliance, but claims to have compliance with his antihypertensive medications as well as his insulin at home.  Upon presentation, the patient was noted to have serum glucose up to 832 with anion gap of 26.  WBC was 28.5.  CT of the abdomen and pelvis showed extensive edema and inflammation of the right hemiscrotum with thickened skin and fluid collection within the thickened skin proximally 1.6 cm.  There is also mild edema and inflammation of the left hemiscrotum.  There is large bilateral hydroceles.  There is no soft tissue gas.  The patient was initially started on vancomycin and ceftriaxone.  Assessment/Plan: Sepsis  -secondary to scrotal cellulitis and epididymitis -continue ceftriaxone and vancomycin -continue doxy -follow blood cultures--neg -UA without pyuria  Epididymitis -continue ceftriaxone -add doxy -check urine NAA for GC and chlamydia--pt refuses testing  Scrotal cellulitis -continue vancomycin  DKA, type I -pt pulled out IV access last night and refuse further attempts for PIV -could not continue IVF -now back in DKA -Patient continues to have elevated anion gap secondary to acute on chronic renal failure and impaired bicarbonate reabsorption -8/14/2019HbA1C--8.1  Intractable vomiting -secondary  to exacerbation gastroparesis from acute medical illness -around-the-clock Zofran -start IV Reglan -Patient with a history of erosive esophagitis-->continuePPI -Urinalysis negative for pyuria -check LFTs--normal -05/29/2017 CT abdomen--wall thickening of the distal esophagus likely secondary to erosive esophagitis, status post cholecystectomy, negative colon, negative CBD  Uncontrolled hypertension -Continue transdermal clonidine-->increase to 0.3 mg -tried carvedilol and amlodipine secondary to intractable vomiting-->pt intermittently refusing po meds-->start lopressor 5 mg IV q 6 hrs -hydralazine 10 mg prn SBP >180  Acute on chronic renal failure--CKD stage V -Baseline creatinine 4.1-4.5 -Serum creatinine peaked at 8.01 -pt also likely has progression of CKD -Continue IV fluids -due to volume depletion -Right AV fistula placed 04/09/2017 -case discussed with nephrology--Dr. Lowanda Foster  Opioid dependence -Patient was on buprenorphine prior to admission, last filled 10/15/17--one week supply -The [place] Controlled Substance Reporting System has been queried for this patient for the past 12 months -He will need to follow-up with his provider after discharge  Diabetes mellitus type 1, uncontrolled -start Lantus when DKA resolved  Tobacco abuse -Tobacco cessation discussed -I have discussed tobacco cessation with the patient.  I have counseled the patient regarding the negative impacts of continued tobacco use including but not limited to lung cancer, COPD, and cardiovascular disease.  I have discussed alternatives to tobacco and modalities that may help facilitate tobacco cessation including but not limited to biofeedback, hypnosis, and medications.  Total time spent with tobacco counseling was 4 minutes.   Chronic abdominal pain -secondary to his gastroparesis  Anemia of CKD -Baseline hemoglobin9-10    Disposition Plan:   Home in 2-3 days  Family  Communication:  No Family at bedside  Consultants:  Nephrology, urology, general  surgery  Code Status:  FULL  DVT Prophylaxis:  [place] Heparin    Procedures: As Listed in Progress Note Above  Antibiotics: vanco 9/10>>> Ceftriaxone 9/10>>> Doxy 9/11>>>  The patient is critically ill with multiple organ systems failure and requires high complexity decision making for assessment and support, frequent evaluation and titration of therapies, application of advanced monitoring technologies and extensive interpretation of multiple databases.  Critical care time - 35 mins.    Subjective: Pt continues to have scrotal pain and edema, no improvement.  He continues to have n/v and abdominal pain.  Denies cp, sob, headache, f/c.  Objective: Vitals:   10/18/17 0400 10/18/17 0500 10/18/17 0600 10/18/17 0800  BP: (!) 207/101 (!) 189/94    Pulse: (!) 129 (!) 122 (!) 111   Resp: 20 13 19    Temp: (!) 100.8 F (38.2 C)   99.1 F (37.3 C)  TempSrc: Oral   Oral  SpO2: 97% 97% 96%   Weight:  87.9 kg    Height:        Intake/Output Summary (Last 24 hours) at 10/18/2017 1010 Last data filed at 10/18/2017 0600 Gross per 24 hour  Intake 774.97 ml  Output 1700 ml  Net -925.03 ml   Weight change: -1.912 kg Exam:   General:  Pt is alert, follows commands appropriately, not in acute distress  HEENT: No icterus, No thrush, No neck mass, [place]/AT  Cardiovascular: RRR, S1/S2, no rubs, no gallops  Respiratory: bibasilar crackles  Abdomen: Soft/+BS, non tender, non distended, no guarding  Extremities: 1 + LE edema, No lymphangitis, No petechiae, No rashes, no synovitis   Data Reviewed: I have personally reviewed following labs and imaging studies Basic Metabolic Panel: Recent Labs  Lab 10/17/17 0850 10/17/17 1625 10/17/17 2019 10/18/17 0002 10/18/17 0536  NA 135 131* 133* 134* 133*  K 4.8 4.0 4.0 3.8 4.3  CL 100 97* 97* 98 94*  CO2 17* 16* 23 22 20*  GLUCOSE 250* 259* 132* 138*  423*  BUN 60* 57* 55* 55* 58*  CREATININE 7.41* 7.28* 6.95* 7.01* 6.95*  CALCIUM 9.2 8.8* 9.0 8.9 9.1   Liver Function Tests: Recent Labs  Lab 10/16/17 1250  AST 18  ALT 7  ALKPHOS 88  BILITOT 0.8  PROT 7.8  ALBUMIN 3.7   No results for input(s): LIPASE, AMYLASE in the last 168 hours. No results for input(s): AMMONIA in the last 168 hours. Coagulation Profile: No results for input(s): INR, PROTIME in the last 168 hours. CBC: Recent Labs  Lab 10/16/17 1250 10/17/17 0342  WBC 28.5* 28.5*  NEUTROABS 25.4*  --   HGB 8.5* 8.5*  HCT 25.8* 26.1*  MCV 89.9 90.3  PLT 350 356   Cardiac Enzymes: No results for input(s): CKTOTAL, CKMB, CKMBINDEX, TROPONINI in the last 168 hours. BNP: Invalid input(s): POCBNP CBG: Recent Labs  Lab 10/17/17 2109 10/17/17 2203 10/17/17 2301 10/18/17 0001 10/18/17 0736  GLUCAP 107* 103* 121* 125* 450*   HbA1C: No results for input(s): HGBA1C in the last 72 hours. Urine analysis:    Component Value Date/Time   COLORURINE YELLOW 10/17/2017 0125   APPEARANCEUR HAZY (A) 10/17/2017 0125   LABSPEC 1.014 10/17/2017 0125   PHURINE 5.0 10/17/2017 0125   GLUCOSEU >=500 (A) 10/17/2017 0125   HGBUR MODERATE (A) 10/17/2017 0125   BILIRUBINUR NEGATIVE 10/17/2017 0125   KETONESUR 20 (A) 10/17/2017 0125   PROTEINUR 100 (A) 10/17/2017 0125   UROBILINOGEN 1.0 11/13/2014 0155   NITRITE NEGATIVE 10/17/2017 0125  LEUKOCYTESUR NEGATIVE 10/17/2017 0125   Sepsis Labs: @LABRCNTIP (procalcitonin:4,lacticidven:4) ) Recent Results (from the past 240 hour(s))  Blood Culture (routine x 2)     Status: None (Preliminary result)   Collection Time: 10/16/17 12:50 PM  Result Value Ref Range Status   Specimen Description BLOOD LEFT ANTECUBITAL  Final   Special Requests   Final    BOTTLES DRAWN AEROBIC AND ANAEROBIC Blood Culture results may not be optimal due to an inadequate volume of blood received in culture bottles   Culture   Final    NO GROWTH < 24  HOURS Performed at [facility], [address] [place], [place] [address]    Report Status PENDING  Incomplete  Blood Culture (routine x 2)     Status: None (Preliminary result)   Collection Time: 10/16/17  9:07 PM  Result Value Ref Range Status   Specimen Description LEFT ANTECUBITAL  Final   Special Requests   Final    BOTTLES DRAWN AEROBIC AND ANAEROBIC Blood Culture adequate volume   Culture   Final    NO GROWTH < 24 HOURS Performed at [facility], [address] [place], [place] [address]    Report Status PENDING  Incomplete  MRSA PCR Screening     Status: None   Collection Time: 10/17/17  4:37 AM  Result Value Ref Range Status   MRSA by PCR NEGATIVE NEGATIVE Final    Comment:        The GeneXpert MRSA Assay (FDA approved for NASAL specimens only), is one component of a comprehensive MRSA colonization surveillance program. It is not intended to diagnose MRSA infection nor to guide or monitor treatment for MRSA infections. Performed at [facility], [address], [place], [place] [address]      Scheduled Meds: . amLODipine  10 mg Oral Daily  . buprenorphine-naloxone  1 tablet Sublingual BID  . [START ON 10/19/2017] cloNIDine  0.3 mg Transdermal Weekly  . doxycycline  100 mg Oral Q12H  . heparin  5,000 Units Subcutaneous Q8H  . insulin aspart  0-15 Units Subcutaneous TID WC  . insulin glargine  10 Units Subcutaneous Once  . metoCLOPramide (REGLAN) injection  5 mg Intravenous Q8H  . metoprolol tartrate  5 mg Intravenous Q6H  . ondansetron (ZOFRAN) IV  4 mg Intravenous Q6H  . pantoprazole  40 mg Oral Daily   Continuous Infusions: . sodium chloride Stopped (10/18/17 0308)  . sodium chloride    . cefTRIAXone (ROCEPHIN)  IV Stopped (10/17/17 1310)  . vancomycin      Procedures/Studies: Ct Abdomen Pelvis Wo Contrast  Result Date: 10/16/2017 CLINICAL DATA:  Four-day history of pain, erythema and swelling involving the RIGHT hemiscrotum, associated with fever.  Current history of end-stage renal disease for which the patient had a an AV fistula placed on 04/09/2017. Surgical history includes cholecystectomy. EXAM: CT ABDOMEN AND PELVIS WITHOUT CONTRAST TECHNIQUE: Multidetector CT imaging of the abdomen and pelvis was performed following the standard protocol without IV contrast. COMPARISON:  07/10/2017, 05/29/2017 and earlier. FINDINGS: Lower chest: Minimal linear scar/atelectasis in the lingula and both lower lobes. Visualized lung bases otherwise clear. Resolution of RIGHT LOWER LOBE pneumonia since 07/10/2017. Heart mildly enlarged with RIGHT coronary artery atherosclerosis. Hepatobiliary: Normal unenhanced appearance of the liver. Surgically absent gallbladder. No unexpected biliary ductal dilation. Pancreas: Normal unenhanced appearance. Spleen: Normal unenhanced appearance. Adrenals/Urinary Tract: Normal appearing adrenal glands. No evidence of urinary tract calculi or obstruction on either side. Within the limits of the unenhanced technique, no  focal parenchymal abnormality involving either kidney. Normal appearing urinary bladder. Stomach/Bowel: Thickening of the wall of the distal esophagus to the level of the EG junction as noted previously. Normal appearing decompressed stomach. Normal-appearing small bowel. Normal-appearing colon with expected stool burden. Normal appendix in the RIGHT UPPER pelvis. Vascular/Lymphatic: Moderate aorto-iliofemoral atherosclerosis without evidence of aneurysm. Enlarged LEFT periaortic retroperitoneal lymph nodes, the largest node measuring approximately 2.3 x 2.1 cm (series 2, image 34), increased in size since the most recent prior CT 07/10/2017 where it measured 1.8 x 1.7 cm at the same level. Multiple upper normal size to slightly enlarged BILATERAL superficial inguinal lymph nodes, increased in size since the most recent prior CT. Reproductive: Prostate gland and seminal vesicles normal in size and appearance for age.  Extensive edema/inflammation involving the RIGHT hemiscrotum with marked skin thickening and a likely fluid collection within the thickened skin measuring approximately 1.6 cm (series 3, image 18). Mild edema/inflammation involving the contralateral LEFT hemiscrotum. Large BILATERAL hydroceles. No evidence of gas within the scrotal tissues. Other: None. Musculoskeletal: Large Schmorl's node in the UPPER endplate of V89 with mild compression deformity of the UPPER endplate of F81, unchanged. No new/acute findings. IMPRESSION: 1. Edema/inflammation involving the RIGHT hemiscrotum with marked skin thickening and a likely abscess within the thickened skin. Edema/inflammation to a lesser degree involves the contralateral LEFT hemiscrotum. Large BILATERAL hydroceles are present. No evidence of gas within the scrotal tissues to suggest Fournier's gangrene. Scrotal ultrasound is recommended in further evaluation to better delineate these findings. 2. Enlarging LEFT periaortic lymph nodes and BILATERAL superficial inguinal lymph nodes since the most recent prior CT 3 months ago. While these are statistically likely reactive, a low-grade lymphoproliferative disorder could have a similar appearance. 3. Stable marked thickening of the wall of the visualized distal esophagus to the level of the EG junction dating back to at least 2017, likely chronic esophagitis. 4.  Aortic Atherosclerosis, advanced for patient age.  (ICD10-170.0) Electronically Signed   By: Evangeline Dakin M.D.   On: 10/16/2017 15:10   US Scrotum W/doppler  Result Date: 10/16/2017 CLINICAL DATA:  Right-sided pain with edema and redness over the last 4 days EXAM: SCROTAL ULTRASOUND DOPPLER ULTRASOUND OF THE TESTICLES TECHNIQUE: Complete ultrasound examination of the testicles, epididymis, and other scrotal structures was performed. Color and spectral Doppler ultrasound were also utilized to evaluate blood flow to the testicles. COMPARISON:  CT abdomen  pelvis of 10/16/2016 FINDINGS: Right testicle Measurements: The right testicle measures 3.9 x 2.4 x 2.6 cm. No intratesticular abnormality is seen. There is blood flow to the right testicle with arterial and venous waveforms. Left testicle Measurements: The left testicle measures 4.2 x 2.7 x 2.6 cm. No intratesticular abnormality is seen. Blood flow is demonstrated to the left testicle with arterial and venous waveforms. Right epididymis:  The right epididymis is unremarkable. Left epididymis: The left epididymis appears enlarged and inhomogeneous as well as hypervascular suggesting left epididymitis. Hydrocele: There is a small right hydrocele present which appears partially septated. Varicocele:  No varicocele is seen. Pulsed Doppler interrogation of both testes demonstrates normal low resistance arterial and venous waveforms bilaterally. There is diffuse scrotal edema and skin thickening present. No discrete abscess is detected. IMPRESSION: 1. Diffusely edematous scrotal wall.  No abscess. 2. Irregular and enlarged left epididymis with hypervascularity suggestive of left epididymitis. 3. No intratesticular abnormality is seen. Blood flow is demonstrated to both testicles with arterial and venous waveforms. 4. Small right hydrocele which appears septated. Electronically Signed  By: Ivar Drape M.D.   On: 10/16/2017 16:22    Orson Eva, DO  [facility] Pager [phone]  If 7PM-7AM, please contact night-coverage [email or web address] Password [email or web address] 10/18/2017, 10:10 AM   LOS: 2 days

## 2017-10-18 NOTE — Op Note (Signed)
Patient:  Brent Daniels  DOB:  [birth year]  MRN:  [id number]   Preop Diagnosis: Acute renal failure  Postop Diagnosis: Same  Procedure: Right femoral vein central line placement  Surgeon: Aviva Signs, MD  Anes: Local  Indications: Patient is a 32 year old white male with renal failure and sepsis who is in need of central IV access.  The risks and benefits of the procedure were fully explained to the patient, who gave informed consent.  Procedure note: The right groin region was prepped and draped using usual sterile technique with ChloraPrep.  Surgical site confirmation was performed.  1% Xylocaine was used for local anesthesia.  A gown, gloves, mask, and full drape were used.  The right femoral vein was accessed using the Seldinger technique without difficulty.  A guidewire was then advanced through the needle.  An introducer was placed over the guidewire.  A triple-lumen central line was then placed over the guidewire.  Good backflow blood was noted on aspiration of all 3 ports.  All 3 ports were flushed with saline.  It was secured at the skin level using a 3-0 silk suture.  A dry sterile dressing was then applied.  Patient tolerated the procedure well.  Complications: None  EBL: Minimal  Specimen: None

## 2017-10-19 ENCOUNTER — Inpatient Hospital Stay (HOSPITAL_COMMUNITY): Payer: Managed Care, Other (non HMO) | Admitting: Anesthesiology

## 2017-10-19 ENCOUNTER — Encounter (HOSPITAL_COMMUNITY): Payer: Self-pay | Admitting: Anesthesiology

## 2017-10-19 ENCOUNTER — Encounter (HOSPITAL_COMMUNITY)
Admission: EM | Disposition: A | Discharge status: Home Health | Payer: Self-pay | Source: Home / Self Care | Attending: Internal Medicine

## 2017-10-19 HISTORY — PX: INCISION AND DRAINAGE ABSCESS: SHX5864

## 2017-10-19 LAB — GLUCOSE, CAPILLARY
GLUCOSE-CAPILLARY: 234 mg/dL — AB (ref 70–99)
Glucose-Capillary: 308 mg/dL — ABNORMAL HIGH (ref 70–99)
Glucose-Capillary: 204 mg/dL — ABNORMAL HIGH (ref 70–99)
Glucose-Capillary: 192 mg/dL — ABNORMAL HIGH (ref 70–99)

## 2017-10-19 LAB — CBC
HEMATOCRIT: 22.8 % — AB (ref 39.0–52.0)
HEMOGLOBIN: 7.5 g/dL — AB (ref 13.0–17.0)
MCH: 29.3 pg (ref 26.0–34.0)
MCHC: 32.9 g/dL (ref 30.0–36.0)
MCV: 89.1 fL (ref 78.0–100.0)
Platelets: 396 10*3/uL (ref 150–400)
RBC: 2.56 MIL/uL — ABNORMAL LOW (ref 4.22–5.81)
RDW: 15.8 % — ABNORMAL HIGH (ref 11.5–15.5)
WBC: 27.5 10*3/uL — ABNORMAL HIGH (ref 4.0–10.5)

## 2017-10-19 LAB — RENAL FUNCTION PANEL
ANION GAP: 12 (ref 5–15)
Albumin: 2.4 g/dL — ABNORMAL LOW (ref 3.5–5.0)
BUN: 57 mg/dL — ABNORMAL HIGH (ref 6–20)
CO2: 22 mmol/L (ref 22–32)
Calcium: 8.5 mg/dL — ABNORMAL LOW (ref 8.9–10.3)
Chloride: 98 mmol/L (ref 98–111)
Creatinine, Ser: 6.42 mg/dL — ABNORMAL HIGH (ref 0.61–1.24)
GFR calc Af Amer: 12 mL/min — ABNORMAL LOW (ref >60)
GFR calc non Af Amer: 10 mL/min — ABNORMAL LOW (ref >60)
GLUCOSE: 325 mg/dL — AB (ref 70–99)
PHOSPHORUS: 2.7 mg/dL (ref 2.5–4.6)
POTASSIUM: 3.8 mmol/L (ref 3.5–5.1)
Sodium: 132 mmol/L — ABNORMAL LOW (ref 135–145)

## 2017-10-19 LAB — PREPARE RBC (CROSSMATCH)

## 2017-10-19 LAB — ABO/RH: ABO/RH(D): A POS

## 2017-10-19 LAB — CREATININE, SERUM
Creatinine, Ser: 6.4 mg/dL — ABNORMAL HIGH (ref 0.61–1.24)
GFR calc Af Amer: 12 mL/min — ABNORMAL LOW (ref >60)
GFR calc non Af Amer: 10 mL/min — ABNORMAL LOW (ref >60)

## 2017-10-19 SURGERY — INCISION AND DRAINAGE, ABSCESS
Anesthesia: General | Site: Scrotum

## 2017-10-19 MED ORDER — 0.9 % SODIUM CHLORIDE (POUR BTL) OPTIME
TOPICAL | Status: DC | PRN
  Administered 2017-10-19: 1000 mL

## 2017-10-19 MED ORDER — HALOPERIDOL LACTATE 5 MG/ML IJ SOLN
2.0000 mg | Freq: Four times a day (QID) | INTRAMUSCULAR | Status: DC | PRN
  Administered 2017-10-19 – 2017-10-29 (×5): 2 mg via INTRAVENOUS
  Filled 2017-10-19 (×6): qty 1

## 2017-10-19 MED ORDER — SODIUM CHLORIDE 0.9 % IV SOLN
1.0000 g | INTRAVENOUS | Status: DC
  Administered 2017-10-20 – 2017-10-21 (×3): 1 g via INTRAVENOUS
  Filled 2017-10-19 (×3): qty 1

## 2017-10-19 MED ORDER — SODIUM CHLORIDE 0.9 % IV SOLN
INTRAVENOUS | Status: AC
  Administered 2017-10-19: 23:00:00
  Filled 2017-10-19: qty 2

## 2017-10-19 MED ORDER — INSULIN ASPART 100 UNIT/ML ~~LOC~~ SOLN
3.0000 [IU] | Freq: Three times a day (TID) | SUBCUTANEOUS | Status: DC
  Administered 2017-10-20 – 2017-10-23 (×9): 3 [IU] via SUBCUTANEOUS

## 2017-10-19 MED ORDER — SODIUM CHLORIDE 0.9% IV SOLUTION: Freq: Once | INTRAVENOUS | Status: DC

## 2017-10-19 MED ORDER — PROPOFOL 10 MG/ML IV BOLUS
INTRAVENOUS | Status: DC | PRN
  Administered 2017-10-19: 160 mg via INTRAVENOUS

## 2017-10-19 MED ORDER — METRONIDAZOLE IN NACL 5-0.79 MG/ML-% IV SOLN
500.0000 mg | Freq: Three times a day (TID) | INTRAVENOUS | Status: DC
  Administered 2017-10-20 – 2017-10-22 (×7): 500 mg via INTRAVENOUS
  Filled 2017-10-19 (×7): qty 100

## 2017-10-19 MED ORDER — DIPHENHYDRAMINE HCL 50 MG/ML IJ SOLN
INTRAMUSCULAR | Status: DC | PRN
  Administered 2017-10-19: 12.5 mg via INTRAVENOUS

## 2017-10-19 MED ORDER — INSULIN ASPART 100 UNIT/ML ~~LOC~~ SOLN
0.0000 [IU] | Freq: Three times a day (TID) | SUBCUTANEOUS | Status: DC
  Administered 2017-10-19 (×2): 3 [IU] via SUBCUTANEOUS
  Administered 2017-10-20: 1 [IU] via SUBCUTANEOUS
  Administered 2017-10-20: 3 [IU] via SUBCUTANEOUS
  Administered 2017-10-20: 2 [IU] via SUBCUTANEOUS
  Administered 2017-10-22: 1 [IU] via SUBCUTANEOUS
  Administered 2017-10-22 (×2): 2 [IU] via SUBCUTANEOUS
  Administered 2017-10-23: 7 [IU] via SUBCUTANEOUS
  Administered 2017-10-23: 2 [IU] via SUBCUTANEOUS
  Administered 2017-10-23: 7 [IU] via SUBCUTANEOUS
  Administered 2017-10-24: 3 [IU] via SUBCUTANEOUS
  Administered 2017-10-24: 7 [IU] via SUBCUTANEOUS
  Administered 2017-10-25: 2 [IU] via SUBCUTANEOUS
  Administered 2017-10-26: 1 [IU] via SUBCUTANEOUS
  Administered 2017-10-28: 7 [IU] via SUBCUTANEOUS

## 2017-10-19 MED ORDER — INSULIN GLARGINE 100 UNIT/ML ~~LOC~~ SOLN
13.0000 [IU] | Freq: Two times a day (BID) | SUBCUTANEOUS | Status: DC
  Administered 2017-10-20 – 2017-10-21 (×4): 13 [IU] via SUBCUTANEOUS
  Filled 2017-10-19 (×10): qty 0.13

## 2017-10-19 MED ORDER — ONDANSETRON HCL 4 MG/2ML IJ SOLN
INTRAMUSCULAR | Status: DC | PRN
  Administered 2017-10-19: 4 mg via INTRAVENOUS

## 2017-10-19 MED ORDER — FENTANYL CITRATE (PF) 250 MCG/5ML IJ SOLN
INTRAMUSCULAR | Status: AC
  Filled 2017-10-19: qty 5

## 2017-10-19 MED ORDER — SODIUM CHLORIDE 0.9 % IV SOLN
INTRAVENOUS | Status: DC | PRN
  Administered 2017-10-19: 23:00:00 via INTRAVENOUS

## 2017-10-19 MED ORDER — INSULIN ASPART 100 UNIT/ML ~~LOC~~ SOLN
0.0000 [IU] | Freq: Every day | SUBCUTANEOUS | Status: DC
  Administered 2017-10-20: 3 [IU] via SUBCUTANEOUS
  Administered 2017-10-25: 2 [IU] via SUBCUTANEOUS

## 2017-10-19 MED ORDER — MIDAZOLAM HCL 2 MG/2ML IJ SOLN
INTRAMUSCULAR | Status: AC
  Filled 2017-10-19: qty 2

## 2017-10-19 MED ORDER — LIDOCAINE HCL (CARDIAC) PF 100 MG/5ML IV SOSY
PREFILLED_SYRINGE | INTRAVENOUS | Status: DC | PRN
  Administered 2017-10-19: 60 mg via INTRATRACHEAL

## 2017-10-19 MED ORDER — PROPOFOL 10 MG/ML IV BOLUS
INTRAVENOUS | Status: AC
  Filled 2017-10-19: qty 20

## 2017-10-19 MED ORDER — FENTANYL CITRATE (PF) 250 MCG/5ML IJ SOLN
INTRAMUSCULAR | Status: DC | PRN
  Administered 2017-10-19 (×5): 50 ug via INTRAVENOUS

## 2017-10-19 MED ORDER — SUCCINYLCHOLINE CHLORIDE 20 MG/ML IJ SOLN
INTRAMUSCULAR | Status: DC | PRN
  Administered 2017-10-19: 120 mg via INTRAVENOUS

## 2017-10-19 SURGICAL SUPPLY — 40 items
BAG DRAIN CYSTO-URO STER (DRAIN) ×1 IMPLANT
BLADE SURG 10 STRL SS (BLADE) ×1 IMPLANT
BLADE SURG 15 STRL LF DISP TIS (BLADE) IMPLANT
BLADE SURG 15 STRL SS (BLADE) ×2
BNDG GAUZE ELAST 4 BULKY (GAUZE/BANDAGES/DRESSINGS) ×2 IMPLANT
CANISTER SUCT 3000ML PPV (MISCELLANEOUS) ×2 IMPLANT
COVER SURGICAL LIGHT HANDLE (MISCELLANEOUS) ×2 IMPLANT
DRSG PAD ABDOMINAL 8X10 ST (GAUZE/BANDAGES/DRESSINGS) ×2 IMPLANT
ELECT CAUTERY BLADE 6.4 (BLADE) ×2 IMPLANT
ELECT REM PT RETURN 9FT ADLT (ELECTROSURGICAL) ×2
ELECTRODE REM PT RTRN 9FT ADLT (ELECTROSURGICAL) ×1 IMPLANT
GAUZE PACKING IODOFORM 1 (PACKING) IMPLANT
GAUZE SPONGE 4X4 12PLY STRL (GAUZE/BANDAGES/DRESSINGS) ×2 IMPLANT
GLOVE SURG SIGNA 7.5 PF LTX (GLOVE) ×2 IMPLANT
GOWN STRL REUS W/ TWL LRG LVL3 (GOWN DISPOSABLE) ×1 IMPLANT
GOWN STRL REUS W/ TWL XL LVL3 (GOWN DISPOSABLE) ×1 IMPLANT
GOWN STRL REUS W/TWL LRG LVL3 (GOWN DISPOSABLE) ×6
GOWN STRL REUS W/TWL XL LVL3 (GOWN DISPOSABLE) ×4
HANDPIECE INTERPULSE COAX TIP (DISPOSABLE) ×2
KIT BASIN OR (CUSTOM PROCEDURE TRAY) ×2 IMPLANT
KIT TURNOVER KIT B (KITS) ×2 IMPLANT
NS IRRIG 1000ML POUR BTL (IV SOLUTION) ×2 IMPLANT
PACK CYSTOSCOPY (CUSTOM PROCEDURE TRAY) ×1 IMPLANT
PACK LITHOTOMY IV (CUSTOM PROCEDURE TRAY) ×2 IMPLANT
PAD ABD 8X10 STRL (GAUZE/BANDAGES/DRESSINGS) ×2 IMPLANT
PAD ARMBOARD 7.5X6 YLW CONV (MISCELLANEOUS) ×2 IMPLANT
PENCIL BUTTON HOLSTER BLD 10FT (ELECTRODE) ×2 IMPLANT
SET HNDPC FAN SPRY TIP SCT (DISPOSABLE) IMPLANT
SPONGE LAP 18X18 X RAY DECT (DISPOSABLE) ×2 IMPLANT
STAPLER VISISTAT 35W (STAPLE) ×1 IMPLANT
SWAB COLLECTION DEVICE MRSA (MISCELLANEOUS) ×1 IMPLANT
SWAB CULTURE ESWAB REG 1ML (MISCELLANEOUS) ×1 IMPLANT
SYR BULB 3OZ (MISCELLANEOUS) ×2 IMPLANT
SYR BULB IRRIGATION 50ML (SYRINGE) ×1 IMPLANT
TOWEL OR 17X24 6PK STRL BLUE (TOWEL DISPOSABLE) ×2 IMPLANT
TOWEL OR 17X26 10 PK STRL BLUE (TOWEL DISPOSABLE) ×2 IMPLANT
TUBE CONNECTING 12X1/4 (SUCTIONS) ×2 IMPLANT
TUBING BULK SUCTION (MISCELLANEOUS) ×1 IMPLANT
UNDERPAD 30X30 (UNDERPADS AND DIAPERS) ×2 IMPLANT
YANKAUER SUCT BULB TIP NO VENT (SUCTIONS) ×2 IMPLANT

## 2017-10-19 NOTE — Progress Notes (Signed)
Inpatient Diabetes Program Recommendations  AACE/ADA: New Consensus Statement on Inpatient Glycemic Control (2019)  Target Ranges:  Prepandial:   less than 140 mg/dL      Peak postprandial:   less than 180 mg/dL (1-2 hours)      Critically ill patients:  140 - 180 mg/dL  Results for Brent Daniels, Brent Daniels (MRN [id number]) as of 10/19/2017 06:46  Ref. Range 10/19/2017 04:24  Glucose Latest Ref Range: 70 - 99 mg/dL 325 (H)   Results for Brent Daniels, Brent Daniels (MRN [id number]) as of 10/19/2017 06:46  Ref. Range 10/18/2017 07:36 10/18/2017 11:31 10/18/2017 17:00 10/18/2017 21:40  Glucose-Capillary Latest Ref Range: 70 - 99 mg/dL 450 (H) 444 (H) 260 (H) 222 (H)   Review of Glycemic Control   Diabetes history: DM1 (makes NO insulin; requires insulin for basal, correction, and meal coverage) Outpatient Diabetes medications: Basaglar 6 units BID, Humalog 1-15 units TID (for meal coverage and correction) Current orders for Inpatient glycemic control: Lantus 10 units BID, Novolog 0-15 units TID with meals, Novolog 0-5 units QHS  Inpatient Diabetes Program Recommendations: Insulin - Basal: Please consider increasing Lantus to 13 units BID. Correction (SSI): Please consider decreasing Novolog correction to 0-9 units TID with meals.  Insulin - Meal Coverage: Please consider ordering Novolog 4 units TID with meals for meal coverage if patient eats at least 50% of meals. HgbA1C: A1C 8.1% on 09/19/17 indicating an average glucose of 186 mg/dl.  Thanks, Barnie Alderman, RN, MSN, CDE Diabetes Coordinator Inpatient Diabetes Program [phone] (Team Pager from 8am to 5pm)

## 2017-10-19 NOTE — Progress Notes (Addendum)
Pharmacy Antibiotic Note  Brent Daniels is a 32 y.o. male admitted on 10/16/2017 with infection.  Pharmacy has been consulted for Vancomycin dosing. Concerned about abscess, continues to spike fevers, pt to OR for I&D now.  Plan: Continue Vancomycin 1000 mg IV every 48 hours. Goal trough 15-20 mcg/mL. Discontinue ceftriaxone Add cefepime 1 gram IV q 24 hours Add Flagyl 500 mg IV q 8 hours Monitor labs, c/s, and vanco trough as indicated   Height: 6' 2.4" (189 cm) Weight: 195 lb 1.7 oz (88.5 kg) IBW/kg (Calculated) : 83.12  Temp (24hrs), Avg:100.6 F (38.1 C), Min:99.5 F (37.5 C), Max:102.4 F (39.1 C)  Recent Labs  Lab 10/16/17 1250 10/16/17 1318  10/17/17 0342  10/17/17 2019 10/18/17 0002 10/18/17 0536 10/18/17 1302 10/19/17 0424  WBC 28.5*  --   --  28.5*  --   --   --   --   --  27.5*  CREATININE 7.79*  --    < > 7.86*   < > 6.95* 7.01* 6.95* 6.74* 6.42*  6.40*  LATICACIDVEN  --  1.58  --   --   --   --   --   --   --   --    < > = values in this interval not displayed.    Estimated Creatinine Clearance: 19.1 mL/min (A) (by C-G formula based on SCr of 6.4 mg/dL (H)).    Allergies  Allergen Reactions  . Nsaids Nausea And Vomiting  . Tramadol Anaphylaxis, Other (See Comments) and Nausea Only    Acid Reflux Acid Reflux  . Ibuprofen Other (See Comments) and Nausea And Vomiting    Stomach ulcers  . Morphine And Related Rash and Hives  . Naproxen Other (See Comments)    Stomach ulcers Stomach ulcers  . Sulfa Antibiotics Rash and Other (See Comments)    Stomach ulcers   . Hydrocodone Bitartrate     Anaphylaxis Can take plain Tylenol  . Ketorolac Other (See Comments)    Unknown    Antimicrobials this admission: Ceftriaxone 9/10 >> 9/13 Vancomycin 9/10 >>  Doxycyline 9/12>> Cefepime 9/13>> Flagyl 9/13 >>  Dose adjustments this admission: n/a  Microbiology results: 9/10 BCx: ngtd 9/11 MRSA PCR is negative 9/12 BCx: pending   Thank you for  allowing [place] to participate in this patients care.   Jens Som, PharmD Please utilize Amion (under [place]) for appropriate number for your unit pharmacist. 10/19/2017 10:24 PM

## 2017-10-19 NOTE — Anesthesia Procedure Notes (Signed)
Procedure Name: Intubation Date/Time: 10/19/2017 11:00 PM Performed by: Clovis Cao, CRNA Pre-anesthesia Checklist: Patient identified, Emergency Drugs available, Suction available, Patient being monitored and Timeout performed Patient Re-evaluated:Patient Re-evaluated prior to induction Oxygen Delivery Method: Circle system utilized Preoxygenation: Pre-oxygenation with 100% oxygen Induction Type: IV induction, Rapid sequence and Cricoid Pressure applied Laryngoscope Size: Miller and 2 Grade View: Grade I Tube type: Subglottic suction tube Tube size: 7.5 mm Number of attempts: 1 Airway Equipment and Method: Stylet Placement Confirmation: ETT inserted through vocal cords under direct vision,  positive ETCO2 and breath sounds checked- equal and bilateral Secured at: 22 cm Tube secured with: Tape Dental Injury: Teeth and Oropharynx as per pre-operative assessment

## 2017-10-19 NOTE — Progress Notes (Signed)
Subjective: CC: Scrotal pain and swelling.  Brent Daniels has persistent severe scrotal swelling with tenderness and reports no improvement.  He remains febrile with a marked leukocytosis and an elevated glucose.    ROS:  Review of Systems  Constitutional: Positive for chills and fever.  Respiratory: Negative.   Cardiovascular: Negative.   Genitourinary:       Scrotal swelling and tenderness..    Anti-infectives: Anti-infectives (From admission, onward)   Start     Dose/Rate Route Frequency Ordered Stop   10/18/17 1200  vancomycin (VANCOCIN) IVPB 1000 mg/200 mL premix     1,000 mg 200 mL/hr over 60 Minutes Intravenous Every 48 hours 10/16/17 1438     10/18/17 1000  doxycycline (VIBRA-TABS) tablet 100 mg     100 mg Oral Every 12 hours 10/18/17 0933     10/17/17 0900  doxycycline (VIBRAMYCIN) 100 mg in sodium chloride 0.9 % 250 mL IVPB  Status:  Discontinued     100 mg 125 mL/hr over 120 Minutes Intravenous Every 12 hours 10/17/17 0749 10/18/17 0933   10/16/17 2000  ceFEPIme (MAXIPIME) 2 g in sodium chloride 0.9 % 100 mL IVPB     2 g 200 mL/hr over 30 Minutes Intravenous  Once 10/16/17 1937 10/16/17 2200   10/16/17 1500  vancomycin (VANCOCIN) IVPB 1000 mg/200 mL premix     1,000 mg 200 mL/hr over 60 Minutes Intravenous  Once 10/16/17 1356 10/16/17 1654   10/16/17 1245  vancomycin (VANCOCIN) IVPB 1000 mg/200 mL premix     1,000 mg 200 mL/hr over 60 Minutes Intravenous  Once 10/16/17 1239 10/16/17 1457   10/16/17 1245  cefTRIAXone (ROCEPHIN) 2 g in sodium chloride 0.9 % 100 mL IVPB     2 g 200 mL/hr over 30 Minutes Intravenous Every 24 hours 10/16/17 1239        Current Facility-Administered Medications  Medication Dose Route Frequency Provider Last Rate Last Dose  . 0.9 %  sodium chloride infusion   Intravenous PRN Orson Eva, MD   Stopped at 10/18/17 0308  . 0.9 %  sodium chloride infusion   Intravenous Continuous Orson Eva, MD 125 mL/hr at 10/19/17 [phone]    . acetaminophen  (TYLENOL) tablet 650 mg  650 mg Oral Q6H PRN Reubin Milan, MD   650 mg at 10/17/17 2035   Or  . acetaminophen (TYLENOL) suppository 650 mg  650 mg Rectal Q6H PRN Reubin Milan, MD   650 mg at 10/18/17 2351  . amLODipine (NORVASC) tablet 10 mg  10 mg Oral Daily Reubin Milan, MD   10 mg at 10/18/17 0946  . buprenorphine-naloxone (SUBOXONE) 8-2 mg per SL tablet 1 tablet  1 tablet Sublingual BID Reubin Milan, MD   1 tablet at 10/17/17 2104  . cefTRIAXone (ROCEPHIN) 2 g in sodium chloride 0.9 % 100 mL IVPB  2 g Intravenous Q24H Reubin Milan, MD   Stopped at 10/18/17 1329  . Chlorhexidine Gluconate Cloth 2 % PADS 6 each  6 each Topical Daily Aviva Signs, MD      . cloNIDine (CATAPRES - Dosed in mg/24 hr) patch 0.3 mg  0.3 mg Transdermal Weekly Reubin Milan, MD      . doxycycline (VIBRA-TABS) tablet 100 mg  100 mg Oral Therisa Doyne, MD   100 mg at 10/18/17 2054  . haloperidol lactate (HALDOL) injection 2 mg  2 mg Intravenous Q6H PRN Heath Lark D, DO   2 mg at 10/19/17 0207  .  heparin injection 5,000 Units  5,000 Units Subcutaneous Q8H Reubin Milan, MD   5,000 Units at 10/18/17 [phone]  . insulin aspart (novoLOG) injection 0-15 Units  0-15 Units Subcutaneous TID WC Orson Eva, MD   8 Units at 10/18/17 1724  . insulin aspart (novoLOG) injection 0-5 Units  0-5 Units Subcutaneous Benay Pike, MD   2 Units at 10/18/17 2150  . insulin glargine (LANTUS) injection 10 Units  10 Units Subcutaneous BID Orson Eva, MD   10 Units at 10/18/17 2151  . metoCLOPramide (REGLAN) injection 5 mg  5 mg Intravenous Franco Collet, MD   5 mg at 10/19/17 0630  . metoprolol tartrate (LOPRESSOR) injection 5 mg  5 mg Intravenous Q6H Orson Eva, MD   5 mg at 10/19/17 0630  . ondansetron (ZOFRAN) injection 4 mg  4 mg Intravenous Q6H PRN Reubin Milan, MD   4 mg at 10/17/17 0123  . ondansetron (ZOFRAN) injection 4 mg  4 mg Intravenous Q6H Orson Eva, MD   4 mg at 10/19/17 0630   . pantoprazole (PROTONIX) EC tablet 40 mg  40 mg Oral Daily Reubin Milan, MD   40 mg at 10/18/17 0946  . promethazine (PHENERGAN) injection 12.5 mg  12.5 mg Intravenous Q6H PRN Reubin Milan, MD   12.5 mg at 10/18/17 2055  . sodium chloride flush (NS) 0.9 % injection 10-40 mL  10-40 mL Intracatheter Q12H Aviva Signs, MD   10 mL at 10/18/17 2055  . sodium chloride flush (NS) 0.9 % injection 10-40 mL  10-40 mL Intracatheter PRN Aviva Signs, MD      . tiZANidine (ZANAFLEX) tablet 2 mg  2 mg Oral Q6H PRN Reubin Milan, MD   2 mg at 10/18/17 2054  . vancomycin (VANCOCIN) IVPB 1000 mg/200 mL premix  1,000 mg Intravenous Q48H Reubin Milan, MD   Stopped at 10/18/17 1430     Objective: Vital signs in last 24 hours: Temp:  [98.7 F (37.1 C)-102.5 F (39.2 C)] 100.6 F (38.1 C) (09/13 0400) Pulse Rate:  [97-119] 102 (09/13 0600) Resp:  [11-20] 14 (09/13 0600) BP: (139-205)/(73-101) 145/83 (09/13 0600) SpO2:  [89 %-97 %] 94 % (09/13 0600) Weight:  [88.9 kg] 88.9 kg (09/13 0500)  Intake/Output from previous day: 09/12 0701 - 09/13 0700 In: 2339.1 [I.V.:2044; IV Piggyback:295.1] Out: 350 [Urine:350] Intake/Output this shift: No intake/output data recorded.   Physical Exam  Constitutional: He appears well-developed and well-nourished.  In obvious discomfort  HENT:  Head: Normocephalic and atraumatic.  Cardiovascular:  Sinus tachycardia.   Pulmonary/Chest: Effort normal. No respiratory distress.  Abdominal: Soft. There is no tenderness.  Genitourinary:  Genitourinary Comments: Marked scrotal swelling with erythema and some bluish discoloration on the right with desquamation and severe tenderness, right > left.  No crepitus or abdominal streaking.  He has a normal phallus.   Musculoskeletal: Normal range of motion. He exhibits no edema or tenderness.  Vitals reviewed.   Lab Results:  Recent Labs    10/17/17 0342 10/19/17 0424  WBC 28.5* 27.5*  HGB 8.5*  7.5*  HCT 26.1* 22.8*  PLT 356 396   BMET Recent Labs    10/18/17 1302 10/19/17 0424  NA 131* 132*  K 4.2 3.8  CL 94* 98  CO2 22 22  GLUCOSE 364* 325*  BUN 58* 57*  CREATININE 6.74* 6.42*  6.40*  CALCIUM 8.9 8.5*   PT/INR No results for input(s): LABPROT, INR in the last 72 hours. ABG  No results for input(s): PHART, HCO3 in the last 72 hours.  Invalid input(s): PCO2, PO2  Studies/Results: I have reviewed his labs and imaging.     Assessment and Plan: Scrotal edema and erythema with tenderness right > left that is most consistent with a scrotal wall abscess or possibly early Fornier's.  He had possible left epididymitis but he is not as tender on that side and his UA on 9/11 was unremarkable.  With the persistent fever and leukocytosis, I think he needs scrotal exploration.   I will discuss that with our on call doctor and determine whether he needs transfer to [facility].       LOS: 3 days    Irine Seal 10/19/2017 [phone] ID: Dario Ave, male   DOB: 1985-08-09, 32 y.o.   MRN: [id number]

## 2017-10-19 NOTE — Progress Notes (Signed)
PROGRESS NOTE  Brent Daniels SWF:[id number] DOB: 10/30/1985 DOA: 10/16/2017 PCP: Glenda Chroman, MD  Brief History28 32 year old male with a history of type 1 diabetes mellitus, diabetic gastroparesis, CKD stage V, tobacco abuse, chronic abdominal pain, erosive esophagitis, hypertension, GERDpresenting with 3 to 4-day history of scrotal pain and swelling associated with suprapubic pain. The patient states that he is able to urinate spontaneously. He has had some subjective fevers and chills. He states that he has had nausea and vomiting for the past 2 days. He denies any hematemesis, hematochezia, melena. The patient has a well-documented history of poor compliance, but claims to have compliance with his antihypertensive medications as well as his insulin at home. Upon presentation, the patient was noted to have serum glucose up to 832 with anion gap of 26. WBC was 28.5. CT of the abdomen and pelvis showed extensive edema and inflammation of the right hemiscrotum with thickened skin and fluid collection within the thickened skin proximally 1.6 cm. There is also mild edema and inflammation of the left hemiscrotum. There is large bilateral hydroceles. There is no soft tissue gas.  The patient was initially started on vancomycin and ceftriaxone.  Assessment/Plan: Sepsis  -secondary to scrotal cellulitis and epididymitis -continue ceftriaxone and vancomycin -continue doxy -follow blood cultures--neg -UA without pyuria -continues to spike fevers with leukocytosis -10/19/17-case discussed with urology, Dr. Darrick Huntsman to [name] for [facility]  Epididymitis -continue ceftriaxone -continue doxy -check urine NAA for GC and chlamydia--pt refuses testing  Scrotal cellulitis -continue vancomycin -concerned about abscess -continues to spike fevers with leukocytosis -10/19/17-case discussed with urology, Dr. Darrick Huntsman to [name] for I&D  DKA, type I -continue  IVF -now off insulin drip since 10/17/17 -increase Lantus to 13 units bid -Patient continues to have elevated anion gap secondary to acute on chronic renal failure and impaired bicarbonate reabsorption -8/14/2019HbA1C--8.1  Intractable vomiting -secondary toexacerbationgastroparesisfrom acute medical illness -around-the-clock Zofran -startedIV Reglan -Patient with a history of erosive esophagitis-->continuePPI -Urinalysis negative for pyuria -check LFTs--normal -05/29/2017 CT abdomen--wall thickening of the distal esophagus likely secondary to erosive esophagitis, status post cholecystectomy, negative colon, negative CBD  Uncontrolled hypertension -Continue transdermal clonidine-->increase to 0.3 mg -tried carvedilol and amlodipine secondary to intractable vomiting-->pt intermittently refusing po meds-->start lopressor 5 mg IV q 6 hrs -hydralazine 10 mg prn SBP >180  Acute on chronic renal failure--CKD stage V -Baseline creatinine 4.1-4.5 -Serum creatinine peaked at8.01 -pt also likely has progression of CKD -Continue IV fluids -partly due to volume depletion -RightAV fistula placed 04/09/2017 -case discussed with nephrology-->no indication for HD; anticipate continued improvement with IVF and correcting other acute medical issues  Opioid dependence -Patient was on buprenorphine prior to admission, last filled9/9/19--one week supply -[facility] has been queried for this patient for the past 12 months -He will need to follow-up with his provider after discharge  Diabetes mellitus type 1, uncontrolled -started Lantus as discussed above  Tobacco abuse -Tobacco cessation discussed -I have discussed tobacco cessation with the patient.  I have counseled the patient regarding the negative impacts of continued tobacco use including but not limited to lung cancer, COPD, and cardiovascular disease.  I have discussed alternatives  to tobacco and modalities that may help facilitate tobacco cessation including but not limited to biofeedback, hypnosis, and medications.  Total time spent with tobacco counseling was 4 minutes.   Chronic abdominal pain -secondary to his gastroparesis  Anemia of CKD -Baseline hemoglobin9-10  Disposition Plan: Transfer to [employer] Family Communication: NoFamily at bedside  Consultants:Nephrology, urology, general surgery  Code Status: FULL  DVT Prophylaxis:  Heparin    Procedures: Right femoral central line 10/18/17  Antibiotics: vanco 9/10>>> Ceftriaxone 9/10>>> Doxy 9/11>>>    Subjective: Pt continues to c/o of scrotal pain and edema.  He denies  cp, sob, n/v/d, abd pain.  He had f/c overnight.  Was agitated overnight  Objective: Vitals:   10/19/17 0600 10/19/17 0700 10/19/17 0800 10/19/17 0900  BP: (!) 145/83 (!) 158/94  (!) 177/91  Pulse: (!) 102 99 (!) 107 (!) 118  Resp: 14 12 15 16   Temp:   99.7 F (37.6 C)   TempSrc:   Axillary   SpO2: 94% 95% 96% 98%  Weight:      Height:        Intake/Output Summary (Last 24 hours) at 10/19/2017 1012 Last data filed at 10/19/2017 0900 Gross per 24 hour  Intake 2632.8 ml  Output 350 ml  Net 2282.8 ml   Weight change: 1 kg Exam:   General:  Pt is alert, follows commands appropriately, not in acute distress  HEENT: No icterus, No thrush, No neck mass, [place]/AT  Cardiovascular: RRR, S1/S2, no rubs, no gallops  Respiratory: bibasilar crackles, no wheeze  Abdomen: Soft/+BS, non tender, non distended, no guarding  Extremities: No edema, No lymphangitis, No petechiae, No rashes, no synovitis   Data Reviewed: I have personally reviewed following labs and imaging studies Basic Metabolic Panel: Recent Labs  Lab 10/17/17 2019 10/18/17 0002 10/18/17 0536 10/18/17 1302 10/19/17 0424  NA 133* 134* 133* 131* 132*  K 4.0 3.8 4.3 4.2 3.8  CL 97* 98 94* 94* 98  CO2 23 22 20* 22 22   GLUCOSE 132* 138* 423* 364* 325*  BUN 55* 55* 58* 58* 57*  CREATININE 6.95* 7.01* 6.95* 6.74* 6.42*  6.40*  CALCIUM 9.0 8.9 9.1 8.9 8.5*  PHOS  --   --   --   --  2.7   Liver Function Tests: Recent Labs  Lab 10/16/17 1250 10/19/17 0424  AST 18  --   ALT 7  --   ALKPHOS 88  --   BILITOT 0.8  --   PROT 7.8  --   ALBUMIN 3.7 2.4*   No results for input(s): LIPASE, AMYLASE in the last 168 hours. No results for input(s): AMMONIA in the last 168 hours. Coagulation Profile: No results for input(s): INR, PROTIME in the last 168 hours. CBC: Recent Labs  Lab 10/16/17 1250 10/17/17 0342 10/19/17 0424  WBC 28.5* 28.5* 27.5*  NEUTROABS 25.4*  --   --   HGB 8.5* 8.5* 7.5*  HCT 25.8* 26.1* 22.8*  MCV 89.9 90.3 89.1  PLT 350 356 396   Cardiac Enzymes: No results for input(s): CKTOTAL, CKMB, CKMBINDEX, TROPONINI in the last 168 hours. BNP: Invalid input(s): POCBNP CBG: Recent Labs  Lab 10/18/17 0736 10/18/17 1131 10/18/17 1700 10/18/17 2140 10/19/17 0825  GLUCAP 450* 444* 260* 222* 308*   HbA1C: No results for input(s): HGBA1C in the last 72 hours. Urine analysis:    Component Value Date/Time   COLORURINE YELLOW 10/17/2017 0125   APPEARANCEUR HAZY (A) 10/17/2017 0125   LABSPEC 1.014 10/17/2017 0125   PHURINE 5.0 10/17/2017 0125   GLUCOSEU >=500 (A) 10/17/2017 0125   HGBUR MODERATE (A) 10/17/2017 0125   BILIRUBINUR NEGATIVE 10/17/2017 0125   KETONESUR 20 (A) 10/17/2017 0125   PROTEINUR 100 (A) 10/17/2017 0125   UROBILINOGEN  1.0 11/13/2014 0155   NITRITE NEGATIVE 10/17/2017 0125   LEUKOCYTESUR NEGATIVE 10/17/2017 0125   Sepsis Labs: @LABRCNTIP (procalcitonin:4,lacticidven:4) ) Recent Results (from the past 240 hour(s))  Blood Culture (routine x 2)     Status: None (Preliminary result)   Collection Time: 10/16/17 12:50 PM  Result Value Ref Range Status   Specimen Description BLOOD LEFT ANTECUBITAL  Final   Special Requests   Final    BOTTLES DRAWN AEROBIC AND  ANAEROBIC Blood Culture results may not be optimal due to an inadequate volume of blood received in culture bottles   Culture   Final    NO GROWTH 3 DAYS Performed at [facility], [address] [place], [place] [address]    Report Status PENDING  Incomplete  Blood Culture (routine x 2)     Status: None (Preliminary result)   Collection Time: 10/16/17  9:07 PM  Result Value Ref Range Status   Specimen Description LEFT ANTECUBITAL  Final   Special Requests   Final    BOTTLES DRAWN AEROBIC AND ANAEROBIC Blood Culture adequate volume   Culture   Final    NO GROWTH 3 DAYS Performed at [facility], [address] [place], [place] [address]    Report Status PENDING  Incomplete  MRSA PCR Screening     Status: None   Collection Time: 10/17/17  4:37 AM  Result Value Ref Range Status   MRSA by PCR NEGATIVE NEGATIVE Final    Comment:        The GeneXpert MRSA Assay (FDA approved for NASAL specimens only), is one component of a comprehensive MRSA colonization surveillance program. It is not intended to diagnose MRSA infection nor to guide or monitor treatment for MRSA infections. Performed at [facility], [address], [place], [place] [address]   Culture, blood (routine x 2)     Status: None (Preliminary result)   Collection Time: 10/18/17  6:05 PM  Result Value Ref Range Status   Specimen Description BLOOD LEFT HAND  Final   Special Requests   Final    BOTTLES DRAWN AEROBIC AND ANAEROBIC Blood Culture adequate volume   Culture   Final    NO GROWTH < 12 HOURS Performed at [facility], [address], [place], [place] [address]    Report Status PENDING  Incomplete  Culture, blood (routine x 2)     Status: None (Preliminary result)   Collection Time: 10/18/17  6:09 PM  Result Value Ref Range Status   Specimen Description BLOOD LEFT HAND  Final   Special Requests Blood Culture adequate volume  Final   Culture   Final    NO GROWTH < 12 HOURS Performed at [facility],  [address] [place], [place] [address]    Report Status PENDING  Incomplete     Scheduled Meds: . amLODipine  10 mg Oral Daily  . buprenorphine-naloxone  1 tablet Sublingual BID  . Chlorhexidine Gluconate Cloth  6 each Topical Daily  . cloNIDine  0.3 mg Transdermal Weekly  . doxycycline  100 mg Oral Q12H  . heparin  5,000 Units Subcutaneous Q8H  . insulin aspart  0-15 Units Subcutaneous TID WC  . insulin aspart  0-5 Units Subcutaneous QHS  . insulin glargine  10 Units Subcutaneous BID  . metoCLOPramide (REGLAN) injection  5 mg Intravenous Q8H  . metoprolol tartrate  5 mg Intravenous Q6H  . ondansetron (ZOFRAN) IV  4 mg Intravenous Q6H  . pantoprazole  40 mg Oral Daily  .  sodium chloride flush  10-40 mL Intracatheter Q12H   Continuous Infusions: . sodium chloride Stopped (10/18/17 0308)  . sodium chloride 125 mL/hr at 10/19/17 0900  . cefTRIAXone (ROCEPHIN)  IV Stopped (10/18/17 1329)  . vancomycin Stopped (10/18/17 1430)    Procedures/Studies: Ct Abdomen Pelvis Wo Contrast  Result Date: 10/16/2017 CLINICAL DATA:  Four-day history of pain, erythema and swelling involving the RIGHT hemiscrotum, associated with fever. Current history of end-stage renal disease for which the patient had a an AV fistula placed on 04/09/2017. Surgical history includes cholecystectomy. EXAM: CT ABDOMEN AND PELVIS WITHOUT CONTRAST TECHNIQUE: Multidetector CT imaging of the abdomen and pelvis was performed following the standard protocol without IV contrast. COMPARISON:  07/10/2017, 05/29/2017 and earlier. FINDINGS: Lower chest: Minimal linear scar/atelectasis in the lingula and both lower lobes. Visualized lung bases otherwise clear. Resolution of RIGHT LOWER LOBE pneumonia since 07/10/2017. Heart mildly enlarged with RIGHT coronary artery atherosclerosis. Hepatobiliary: Normal unenhanced appearance of the liver. Surgically absent gallbladder. No unexpected biliary ductal dilation. Pancreas: Normal unenhanced  appearance. Spleen: Normal unenhanced appearance. Adrenals/Urinary Tract: Normal appearing adrenal glands. No evidence of urinary tract calculi or obstruction on either side. Within the limits of the unenhanced technique, no focal parenchymal abnormality involving either kidney. Normal appearing urinary bladder. Stomach/Bowel: Thickening of the wall of the distal esophagus to the level of the EG junction as noted previously. Normal appearing decompressed stomach. Normal-appearing small bowel. Normal-appearing colon with expected stool burden. Normal appendix in the RIGHT UPPER pelvis. Vascular/Lymphatic: Moderate aorto-iliofemoral atherosclerosis without evidence of aneurysm. Enlarged LEFT periaortic retroperitoneal lymph nodes, the largest node measuring approximately 2.3 x 2.1 cm (series 2, image 34), increased in size since the most recent prior CT 07/10/2017 where it measured 1.8 x 1.7 cm at the same level. Multiple upper normal size to slightly enlarged BILATERAL superficial inguinal lymph nodes, increased in size since the most recent prior CT. Reproductive: Prostate gland and seminal vesicles normal in size and appearance for age. Extensive edema/inflammation involving the RIGHT hemiscrotum with marked skin thickening and a likely fluid collection within the thickened skin measuring approximately 1.6 cm (series 3, image 18). Mild edema/inflammation involving the contralateral LEFT hemiscrotum. Large BILATERAL hydroceles. No evidence of gas within the scrotal tissues. Other: None. Musculoskeletal: Large Schmorl's node in the UPPER endplate of Z99 with mild compression deformity of the UPPER endplate of J57, unchanged. No new/acute findings. IMPRESSION: 1. Edema/inflammation involving the RIGHT hemiscrotum with marked skin thickening and a likely abscess within the thickened skin. Edema/inflammation to a lesser degree involves the contralateral LEFT hemiscrotum. Large BILATERAL hydroceles are present. No  evidence of gas within the scrotal tissues to suggest Fournier's gangrene. Scrotal ultrasound is recommended in further evaluation to better delineate these findings. 2. Enlarging LEFT periaortic lymph nodes and BILATERAL superficial inguinal lymph nodes since the most recent prior CT 3 months ago. While these are statistically likely reactive, a low-grade lymphoproliferative disorder could have a similar appearance. 3. Stable marked thickening of the wall of the visualized distal esophagus to the level of the EG junction dating back to at least 2017, likely chronic esophagitis. 4.  Aortic Atherosclerosis, advanced for patient age.  (ICD10-170.0) Electronically Signed   By: Evangeline Dakin M.D.   On: 10/16/2017 15:10   US Scrotum W/doppler  Result Date: 10/16/2017 CLINICAL DATA:  Right-sided pain with edema and redness over the last 4 days EXAM: SCROTAL ULTRASOUND DOPPLER ULTRASOUND OF THE TESTICLES TECHNIQUE: Complete ultrasound examination of the testicles, epididymis, and other scrotal structures  was performed. Color and spectral Doppler ultrasound were also utilized to evaluate blood flow to the testicles. COMPARISON:  CT abdomen pelvis of 10/16/2016 FINDINGS: Right testicle Measurements: The right testicle measures 3.9 x 2.4 x 2.6 cm. No intratesticular abnormality is seen. There is blood flow to the right testicle with arterial and venous waveforms. Left testicle Measurements: The left testicle measures 4.2 x 2.7 x 2.6 cm. No intratesticular abnormality is seen. Blood flow is demonstrated to the left testicle with arterial and venous waveforms. Right epididymis:  The right epididymis is unremarkable. Left epididymis: The left epididymis appears enlarged and inhomogeneous as well as hypervascular suggesting left epididymitis. Hydrocele: There is a small right hydrocele present which appears partially septated. Varicocele:  No varicocele is seen. Pulsed Doppler interrogation of both testes demonstrates  normal low resistance arterial and venous waveforms bilaterally. There is diffuse scrotal edema and skin thickening present. No discrete abscess is detected. IMPRESSION: 1. Diffusely edematous scrotal wall.  No abscess. 2. Irregular and enlarged left epididymis with hypervascularity suggestive of left epididymitis. 3. No intratesticular abnormality is seen. Blood flow is demonstrated to both testicles with arterial and venous waveforms. 4. Small right hydrocele which appears septated. Electronically Signed   By: Ivar Drape M.D.   On: 10/16/2017 16:22    Orson Eva, DO  [facility] Pager [phone]  If 7PM-7AM, please contact night-coverage [email or web address] Password [email or web address] 10/19/2017, 10:12 AM   LOS: 3 days

## 2017-10-19 NOTE — Progress Notes (Signed)
Brent Daniels  MRN: [id number]  DOB/AGE: 02/16/1985 32 y.o.  Primary Care Physician:Vyas, Costella Hatcher, MD  Admit date: 10/16/2017  Chief Complaint:  Chief Complaint  Patient presents with  . Abscess    Daniels-Pt presented on  10/16/2017 with  Chief Complaint  Patient presents with  . Abscess  .    Pt voices no new concerns.   Meds . amLODipine  10 mg Oral Daily  . buprenorphine-naloxone  1 tablet Sublingual BID  . Chlorhexidine Gluconate Cloth  6 each Topical Daily  . cloNIDine  0.3 mg Transdermal Weekly  . doxycycline  100 mg Oral Q12H  . heparin  5,000 Units Subcutaneous Q8H  . insulin aspart  0-15 Units Subcutaneous TID WC  . insulin aspart  0-5 Units Subcutaneous QHS  . insulin glargine  10 Units Subcutaneous BID  . metoCLOPramide (REGLAN) injection  5 mg Intravenous Q8H  . metoprolol tartrate  5 mg Intravenous Q6H  . ondansetron (ZOFRAN) IV  4 mg Intravenous Q6H  . pantoprazole  40 mg Oral Daily  . sodium chloride flush  10-40 mL Intracatheter Q12H      Physical Exam: Vital signs in last 24 hours: Temp:  [98.7 F (37.1 C)-102.5 F (39.2 C)] 99.7 F (37.6 C) (09/13 0800) Pulse Rate:  [97-119] 102 (09/13 0600) Resp:  [11-20] 14 (09/13 0600) BP: (139-205)/(73-101) 145/83 (09/13 0600) SpO2:  [89 %-97 %] 94 % (09/13 0600) Weight:  [88.9 kg] 88.9 kg (09/13 0500) Weight change: 1 kg    Intake/Output from previous day: 09/12 0701 - 09/13 0700 In: 2339.1 [I.V.:2044; IV Piggyback:295.1] Out: 350 [Urine:350] No intake/output data recorded.   Physical Exam: General- pt iis awake, follows commands. Resp- No acute REsp distress, Rhonchi+ CVS- S1S2 regular in rate and rhythm GIT- BS+, soft, NT, ND EXT- 1+ LE Edema, no Cyanosis   Lab Results: CBC Recent Labs    10/17/17 0342 10/19/17 0424  WBC 28.5* 27.5*  HGB 8.5* 7.5*  HCT 26.1* 22.8*  PLT 356 396    BMET Recent Labs    10/18/17 1302 10/19/17 0424  NA 131* 132*  K 4.2 3.8  CL 94* 98  CO2 22 22   GLUCOSE 364* 325*  BUN 58* 57*  CREATININE 6.74* 6.42*  6.40*  CALCIUM 8.9 8.5*   Creat trend 2019  7.4--8.0=> 6.74=>6.4 2018  3.2--6.37 September at [place]   4.9--5.7 July 3.2--4.6 2016  1.3          MICRO Recent Results (from the past 240 hour(Daniels))  Blood Culture (routine x 2)     Status: None (Preliminary result)   Collection Time: 10/16/17 12:50 PM  Result Value Ref Range Status   Specimen Description BLOOD LEFT ANTECUBITAL  Final   Special Requests   Final    BOTTLES DRAWN AEROBIC AND ANAEROBIC Blood Culture results may not be optimal due to an inadequate volume of blood received in culture bottles   Culture   Final    NO GROWTH 3 DAYS Performed at [facility], [address], [place], [place] [address]    Report Status PENDING  Incomplete  Blood Culture (routine x 2)     Status: None (Preliminary result)   Collection Time: 10/16/17  9:07 PM  Result Value Ref Range Status   Specimen Description LEFT ANTECUBITAL  Final   Special Requests   Final    BOTTLES DRAWN AEROBIC AND ANAEROBIC Blood Culture adequate volume   Culture   Final    NO  GROWTH 3 DAYS Performed at [facility], [address] [place], [place] [address]    Report Status PENDING  Incomplete  MRSA PCR Screening     Status: None   Collection Time: 10/17/17  4:37 AM  Result Value Ref Range Status   MRSA by PCR NEGATIVE NEGATIVE Final    Comment:        The GeneXpert MRSA Assay (FDA approved for NASAL specimens only), is one component of a comprehensive MRSA colonization surveillance program. It is not intended to diagnose MRSA infection nor to guide or monitor treatment for MRSA infections. Performed at [facility], [address], [place], [place] [address]   Culture, blood (routine x 2)     Status: None (Preliminary result)   Collection Time: 10/18/17  6:05 PM  Result Value Ref Range Status   Specimen Description BLOOD LEFT HAND  Final   Special Requests   Final    BOTTLES DRAWN  AEROBIC AND ANAEROBIC Blood Culture adequate volume   Culture   Final    NO GROWTH < 12 HOURS Performed at [facility], [address] [place], [place] [address]    Report Status PENDING  Incomplete  Culture, blood (routine x 2)     Status: None (Preliminary result)   Collection Time: 10/18/17  6:09 PM  Result Value Ref Range Status   Specimen Description BLOOD LEFT HAND  Final   Special Requests Blood Culture adequate volume  Final   Culture   Final    NO GROWTH < 12 HOURS Performed at [facility], [address] [place],  [address]    Report Status PENDING  Incomplete      Lab Results  Component Value Date   PTH 125 (H) 11/16/2016   PTH Comment 11/16/2016   CALCIUM 8.5 (L) 10/19/2017   CAION 1.15 03/31/2014   CAION 1.15 03/31/2014   PHOS 2.7 10/19/2017               Impression: 1)Renal  AKI secondary to Prerenal/ATN                    AKI on CKD               CKD stage 5.               CKD since 2016               CKD secondary to DM 1                Progression of CKD                  as expected for uncontrolled DM 1-Pt with 22 years of DM                 multiple AKI                Proteinura Present.                             AKI now better  2)HTN  Medication- On Calcium Channel Blockers On Beta blockers   3)Anemia HGb at goal (9--11)  4)CKD Mineral-Bone Disorder PTH High Secondary Hyperparathyroidism present Phosphorus not at goal. Calcium- at goal  5)GU- admitted with scrotal swelling/Fournier?  Urology and  Primary MD following  6)Electrolytes  Normokalemic  Hypoonatremic   Hyperglycemia + CKD stage 5   better  7)Acid base Co2 not  at goal Low chloride NON AG acidosis from CKD   8) Id-admitted with possible sepsis        On broad spectrum abx         Plan:  Will continue current care.    Brent Daniels 10/19/2017, 9:22 AM

## 2017-10-19 NOTE — Progress Notes (Addendum)
Pharmacy Antibiotic Note  Brent Daniels is a 32 y.o. male admitted on 10/16/2017 with infection.  Pharmacy has been consulted for Vancomycin dosing. Concerned about abscess, continues to spike fevers, pt to transfer to [facility] for I&D  Plan: Continue Vancomycin 1000 mg IV every 48 hours. Goal trough 15-20 mcg/mL. Ceftriaxone 2000 mg IV every 24 hours. Doxycycline 100mg  PO q24h Monitor labs, c/s, and vanco trough as indicated   Height: 6\' 1"  (185.4 cm) Weight: 195 lb 15.8 oz (88.9 kg) IBW/kg (Calculated) : 79.9  Temp (24hrs), Avg:101.4 F (38.6 C), Min:99.7 F (37.6 C), Max:102.5 F (39.2 C)  Recent Labs  Lab 10/16/17 1250 10/16/17 1318  10/17/17 0342  10/17/17 2019 10/18/17 0002 10/18/17 0536 10/18/17 1302 10/19/17 0424  WBC 28.5*  --   --  28.5*  --   --   --   --   --  27.5*  CREATININE 7.79*  --    < > 7.86*   < > 6.95* 7.01* 6.95* 6.74* 6.42*  6.40*  LATICACIDVEN  --  1.58  --   --   --   --   --   --   --   --    < > = values in this interval not displayed.    Estimated Creatinine Clearance: 18.4 mL/min (A) (by C-G formula based on SCr of 6.4 mg/dL (H)).    Allergies  Allergen Reactions  . Nsaids Nausea And Vomiting  . Tramadol Anaphylaxis, Other (See Comments) and Nausea Only    Acid Reflux Acid Reflux  . Ibuprofen Other (See Comments) and Nausea And Vomiting    Stomach ulcers  . Morphine And Related Rash and Hives  . Naproxen Other (See Comments)    Stomach ulcers Stomach ulcers  . Sulfa Antibiotics Rash and Other (See Comments)    Stomach ulcers   . Hydrocodone Bitartrate     Anaphylaxis Can take plain Tylenol  . Ketorolac Other (See Comments)    Unknown    Antimicrobials this admission: Ceftriaxone 9/10 >>  Vancomycin 9/10 >>  Doxycyline 9/12>>  Dose adjustments this admission: n/a  Microbiology results: 9/10 BCx: ngtd 9/11 MRSA PCR is negative 9/12 BCx: pending  Thank you for allowing pharmacy to be a part of this patient's  care.  Isac Sarna, BS [place], [place] Clinical Pharmacist Pager [phone] 10/19/2017 1:06 PM

## 2017-10-19 NOTE — Progress Notes (Signed)
Pt  transferred via  [place]. No problems at this time. Report given by previus nurse.

## 2017-10-19 NOTE — Anesthesia Preprocedure Evaluation (Addendum)
Anesthesia Evaluation  Patient identified by MRN, date of birth, ID band Patient awake    Reviewed: Allergy & Precautions, NPO status , Patient's Chart, lab work & pertinent test results  Airway Mallampati: II  TM Distance: >3 FB Neck ROM: Full    Dental  (+) Dental Advisory Given, Poor Dentition   Pulmonary Current Smoker,    breath sounds clear to auscultation       Cardiovascular hypertension, Pt. on medications  Rhythm:Regular Rate:Normal     Neuro/Psych Anxiety Depression negative neurological ROS     GI/Hepatic Neg liver ROS, PUD, GERD  Medicated,  Endo/Other  diabetes, Type 1, Insulin Dependent  Renal/GU CRFRenal disease     Musculoskeletal   Abdominal   Peds  Hematology  (+) anemia ,   Anesthesia Other Findings   Reproductive/Obstetrics                            Anesthesia Physical Anesthesia Plan  ASA: III and emergent  Anesthesia Plan: General   Post-op Pain Management:    Induction: Intravenous and Rapid sequence  PONV Risk Score and Plan: 1 and Ondansetron, Midazolam and Treatment may vary due to age or medical condition  Airway Management Planned: Oral ETT  Additional Equipment:   Intra-op Plan:   Post-operative Plan: Extubation in OR  Informed Consent: I have reviewed the patients History and Physical, chart, labs and discussed the procedure including the risks, benefits and alternatives for the proposed anesthesia with the patient or authorized representative who has indicated his/her understanding and acceptance.   Dental advisory given  Plan Discussed with: CRNA, Anesthesiologist and Surgeon  Anesthesia Plan Comments:       Anesthesia Quick Evaluation

## 2017-10-19 NOTE — Progress Notes (Signed)
Pt found resting in bed naked but noted blood all over bed, floor, and countertops. IV tubing had been disconnected and then hung over IV pole. IV tubing had pressure cap attached.  Noted distal port had no cap and blood flowing from port. Reattached cap and flushed without difficulty.  Pt and room cleaned. Pt verbally abusive and uncooperative with staff. MD notified and new orders received,

## 2017-10-19 NOTE — Progress Notes (Signed)
Called by patient's nurse regarding agitation throughout the evening and attempts to remove femoral line.  Patient continues to yell and remain agitated.  Ordered four-point restraints as well as Haldol for sedation.

## 2017-10-19 NOTE — Treatment Plan (Addendum)
Re-examined patient upon transfer to [facility]   Large tender right hemiscrotum, moderate left with erythematous + blue superficial discolorations. +crepitus along right canal and suprapubic region. Foul-smelling, necrotic odor.   Concern for Fournier's at this point. Particularly with sustained WBC to 27, fevers, uncontrolled BGs. This was communicated with patient as well as urgency in intervention.  Informed consent obtained. Patient is drowsy at times but able to repeat back to me the procedure, risks, and expected post-op recovery with likely an open wound.   We will plan to proceed emergently to OR for scrotal exploration, washout, debridement. Pending operative course, there is a high chance he may need ICU level care post operatively.

## 2017-10-20 DIAGNOSIS — N493 Fournier gangrene: Secondary | ICD-10-CM

## 2017-10-20 LAB — BASIC METABOLIC PANEL
ANION GAP: 13 (ref 5–15)
Anion gap: 10 (ref 5–15)
BUN: 56 mg/dL — ABNORMAL HIGH (ref 6–20)
BUN: 53 mg/dL — AB (ref 6–20)
CALCIUM: 8.7 mg/dL — AB (ref 8.9–10.3)
CHLORIDE: 103 mmol/L (ref 98–111)
CO2: 19 mmol/L — ABNORMAL LOW (ref 22–32)
CO2: 24 mmol/L (ref 22–32)
Calcium: 8.5 mg/dL — ABNORMAL LOW (ref 8.9–10.3)
Chloride: 102 mmol/L (ref 98–111)
Creatinine, Ser: 6.11 mg/dL — ABNORMAL HIGH (ref 0.61–1.24)
Creatinine, Ser: 5.7 mg/dL — ABNORMAL HIGH (ref 0.61–1.24)
GFR calc Af Amer: 13 mL/min — ABNORMAL LOW (ref >60)
GFR calc Af Amer: 14 mL/min — ABNORMAL LOW (ref >60)
GFR, EST NON AFRICAN AMERICAN: 11 mL/min — AB (ref >60)
GFR, EST NON AFRICAN AMERICAN: 12 mL/min — AB (ref >60)
GLUCOSE: 276 mg/dL — AB (ref 70–99)
GLUCOSE: 196 mg/dL — AB (ref 70–99)
POTASSIUM: 4.2 mmol/L (ref 3.5–5.1)
POTASSIUM: 4.1 mmol/L (ref 3.5–5.1)
Sodium: 135 mmol/L (ref 135–145)
Sodium: 136 mmol/L (ref 135–145)

## 2017-10-20 LAB — CBC
HCT: 28.4 % — ABNORMAL LOW (ref 39.0–52.0)
HEMATOCRIT: 28.8 % — AB (ref 39.0–52.0)
HEMOGLOBIN: 9.1 g/dL — AB (ref 13.0–17.0)
HEMOGLOBIN: 9 g/dL — AB (ref 13.0–17.0)
MCH: 29.3 pg (ref 26.0–34.0)
MCH: 28.5 pg (ref 26.0–34.0)
MCHC: 32 g/dL (ref 30.0–36.0)
MCHC: 31.3 g/dL (ref 30.0–36.0)
MCV: 91.3 fL (ref 78.0–100.0)
MCV: 91.1 fL (ref 78.0–100.0)
PLATELETS: 399 10*3/uL (ref 150–400)
Platelets: 410 10*3/uL — ABNORMAL HIGH (ref 150–400)
RBC: 3.11 MIL/uL — AB (ref 4.22–5.81)
RBC: 3.16 MIL/uL — ABNORMAL LOW (ref 4.22–5.81)
RDW: 15.9 % — ABNORMAL HIGH (ref 11.5–15.5)
RDW: 16.9 % — ABNORMAL HIGH (ref 11.5–15.5)
WBC: 28.9 10*3/uL — AB (ref 4.0–10.5)
WBC: 28 10*3/uL — AB (ref 4.0–10.5)

## 2017-10-20 LAB — GLUCOSE, CAPILLARY
GLUCOSE-CAPILLARY: 271 mg/dL — AB (ref 70–99)
Glucose-Capillary: 138 mg/dL — ABNORMAL HIGH (ref 70–99)
Glucose-Capillary: 174 mg/dL — ABNORMAL HIGH (ref 70–99)
Glucose-Capillary: 174 mg/dL — ABNORMAL HIGH (ref 70–99)
Glucose-Capillary: 232 mg/dL — ABNORMAL HIGH (ref 70–99)
Glucose-Capillary: 76 mg/dL (ref 70–99)

## 2017-10-20 MED ORDER — PROMETHAZINE HCL 25 MG/ML IJ SOLN: 6.2500 mg | INTRAMUSCULAR | Status: DC | PRN

## 2017-10-20 MED ORDER — HYDROMORPHONE HCL 1 MG/ML IJ SOLN
INTRAMUSCULAR | Status: AC
  Filled 2017-10-20: qty 1

## 2017-10-20 MED ORDER — MORPHINE SULFATE (PF) 2 MG/ML IV SOLN
2.0000 mg | INTRAVENOUS | Status: DC | PRN
  Administered 2017-10-21 – 2017-10-29 (×34): 2 mg via INTRAVENOUS
  Filled 2017-10-20 (×35): qty 1

## 2017-10-20 MED ORDER — POLYETHYLENE GLYCOL 3350 17 G PO PACK
17.0000 g | PACK | Freq: Two times a day (BID) | ORAL | Status: AC
  Administered 2017-10-20 (×2): 17 g via ORAL
  Filled 2017-10-20 (×2): qty 1

## 2017-10-20 MED ORDER — ACETAMINOPHEN 10 MG/ML IV SOLN
INTRAVENOUS | Status: AC
  Administered 2017-10-20: 1000 mg via INTRAVENOUS
  Filled 2017-10-20: qty 100

## 2017-10-20 MED ORDER — OXYCODONE HCL 5 MG PO TABS
5.0000 mg | ORAL_TABLET | Freq: Four times a day (QID) | ORAL | Status: DC | PRN
  Administered 2017-10-20 – 2017-10-28 (×19): 5 mg via ORAL
  Filled 2017-10-20 (×19): qty 1

## 2017-10-20 MED ORDER — CARVEDILOL 25 MG PO TABS
25.0000 mg | ORAL_TABLET | Freq: Two times a day (BID) | ORAL | Status: DC
  Administered 2017-10-20 – 2017-10-28 (×17): 25 mg via ORAL
  Filled 2017-10-20 (×20): qty 1

## 2017-10-20 MED ORDER — SODIUM CHLORIDE 0.9% FLUSH: 9.0000 mL | INTRAVENOUS | Status: DC | PRN

## 2017-10-20 MED ORDER — ONDANSETRON HCL 4 MG/2ML IJ SOLN: 4.0000 mg | Freq: Four times a day (QID) | INTRAMUSCULAR | Status: DC | PRN

## 2017-10-20 MED ORDER — HYDRALAZINE HCL 20 MG/ML IJ SOLN: 10.0000 mg | Freq: Four times a day (QID) | INTRAMUSCULAR | Status: DC | PRN

## 2017-10-20 MED ORDER — DIPHENHYDRAMINE HCL 12.5 MG/5ML PO ELIX: 12.5000 mg | ORAL_SOLUTION | Freq: Four times a day (QID) | ORAL | Status: DC | PRN

## 2017-10-20 MED ORDER — ACETAMINOPHEN 10 MG/ML IV SOLN
1000.0000 mg | Freq: Three times a day (TID) | INTRAVENOUS | Status: AC
  Administered 2017-10-20: 1000 mg via INTRAVENOUS
  Filled 2017-10-20: qty 100

## 2017-10-20 MED ORDER — NALOXONE HCL 0.4 MG/ML IJ SOLN: 0.4000 mg | INTRAMUSCULAR | Status: DC | PRN

## 2017-10-20 MED ORDER — HYDROMORPHONE HCL 1 MG/ML IJ SOLN
1.0000 mg | INTRAMUSCULAR | Status: AC | PRN
  Administered 2017-10-20 (×2): 1 mg via INTRAVENOUS
  Filled 2017-10-20 (×2): qty 1

## 2017-10-20 MED ORDER — DIPHENHYDRAMINE HCL 50 MG/ML IJ SOLN: 12.5000 mg | Freq: Four times a day (QID) | INTRAMUSCULAR | Status: DC | PRN

## 2017-10-20 MED ORDER — SODIUM CHLORIDE 0.45 % IV SOLN: INTRAVENOUS | Status: DC | PRN

## 2017-10-20 MED ORDER — ACETAMINOPHEN 10 MG/ML IV SOLN
1000.0000 mg | Freq: Four times a day (QID) | INTRAVENOUS | Status: DC
  Administered 2017-10-20: 1000 mg via INTRAVENOUS

## 2017-10-20 MED ORDER — HYDROMORPHONE HCL 1 MG/ML IJ SOLN
0.2500 mg | INTRAMUSCULAR | Status: DC | PRN
  Administered 2017-10-20 (×4): 0.5 mg via INTRAVENOUS

## 2017-10-20 MED ORDER — HYDROMORPHONE 1 MG/ML IV SOLN
INTRAVENOUS | Status: DC
  Filled 2017-10-20: qty 25

## 2017-10-20 NOTE — Transfer of Care (Signed)
Immediate Anesthesia Transfer of Care Note  Patient: Brent Daniels  Procedure(s) Performed: INCISION AND DRAINAGE ABSCESS SCROTAL EXPLORATION,WASHOUT, DEBRIBEMENT OF TISSUE (N/A Scrotum)  Patient Location: PACU  Anesthesia Type:General  Level of Consciousness: awake  Airway & Oxygen Therapy: Patient Spontanous Breathing  Post-op Assessment: Report given to RN and Post -op Vital signs reviewed and stable  Post vital signs: Reviewed and stable  Last Vitals:  Vitals Value Taken Time  BP 172/94 10/20/2017 12:23 AM  Temp 36.8 C 10/20/2017 12:23 AM  Pulse 98 10/20/2017 12:23 AM  Resp 15 10/20/2017 12:23 AM  SpO2 100 % 10/20/2017 12:23 AM    Last Pain:  Vitals:   10/19/17 2127  TempSrc: Oral  PainSc:       Patients Stated Pain Goal: 0 (83/15/17 6160)  Complications: No apparent anesthesia complications

## 2017-10-20 NOTE — Progress Notes (Signed)
Patient received in  unit via bed. Patient is alert and oriented x4. Vital signs are stable .skin assessment done with another nurse. Iv in place and running fluid. Patient given instruction about call bell and phone. Bed in low position and call bell in reach.

## 2017-10-20 NOTE — Consult Note (Addendum)
Urology Progress Note   S/p scrotal exploration, debridement and washout 10/20/17   Subjective: NAEON. Vitals remained stable, afebrile Remains drowsy. Mainly bothered by foley catheter. Pain fairly well controlled WBC 28 today which is stable Wound cultures with GNR and GPCs, he remains on triple abx vanc/cefepime/flagyl  Objective: Vital signs in last 24 hours: Temp:  [98 F (36.7 C)-100.4 F (38 C)] 98.1 F (36.7 C) (09/14 1200) Pulse Rate:  [77-108] 81 (09/14 1037) Resp:  [11-22] 12 (09/14 1437) BP: (120-172)/(70-97) 126/78 (09/14 1437) SpO2:  [91 %-100 %] 100 % (09/14 1037) Weight:  [88.5 kg] 88.5 kg (09/13 2058)  Intake/Output from previous day: 09/13 0701 - 09/14 0700 In: 3482.5 [P.O.:240; I.V.:2212.5; Blood:630; IV [id number]] Out: 4259 [Urine:1450; Blood:200] Intake/Output this shift: No intake/output data recorded.  Physical Exam:  General: alert if redirected but drowsy, able to answer questions appropriately intermittently CV: RRR Lungs: Clear Abdomen: Soft, appropriately tender.  GU: Foley in place draining clear yellow urine. Scrotal wound packed with moist kerlix. Hemostatic on appearance. Examined surrounding things and suprapubic region and there is no evidence of spreading infection or crepitus Ext: NT, No erythema  Lab Results: Recent Labs    10/19/17 0424 10/20/17 0253 10/20/17 1154  HGB 7.5* 9.1* 9.0*  HCT 22.8* 28.4* 28.8*   BMET Recent Labs    10/20/17 0253 10/20/17 1154  NA 135 136  K 4.2 4.1  CL 103 102  CO2 19* 24  GLUCOSE 276* 196*  BUN 56* 53*  CREATININE 6.11* 5.70*  CALCIUM 8.5* 8.7*     Studies/Results: No results found.  Assessment/Plan:  32 y.o. male w/ uncontrolled T1DM, developed Fournier's gangrene. S/p initial debridemennt on 10/20/17  - plan to takeback to OR tomorrow for 2nd look. Will see if feasible for loose primary closure and reapproximation of remaining scrotal skin - Please make NPO at [place] - agree  with triple coverage abx    LOS: 4 days   Fredricka Bonine 10/20/2017, 3:38 PM

## 2017-10-20 NOTE — Op Note (Addendum)
Preoperative Diagnosis: Fournier's gangrene  Postoperative Diagnosis: Same  Procedure(s) Performed:  - Scrotal exploration +washout - Debridement of devitalized tissue   Surgeon:  Surgeon(s): Ardis Hughs, MD  Resident Surgeon: Fredricka Bonine, MD  Assistant(s):  none  Anesthesia:  General  Fluids:  See anesthesia record  Estimated blood loss:  200 mL  Specimens:   ID Type Source Tests Collected by Time Destination  A : Scrotal Abscess Body Fluid Other AEROBIC/ANAEROBIC CULTURE (SURGICAL/DEEP WOUND) Ardis Hughs, MD 10/19/2017 2327     Drains:  none  Complications:  None  Indications:  This is a 32 y.o. patient with a history of poorly controlled T1DM who presents with 1 week history of progressive scrotal cellulitis, leukocytosis, fevers. Exam worsened tonight with concern for necrotizing soft tissue infection. After discussion of the risks & benefits and alternatives to surgical approach, the patient wishes to proceed with emergent scrotal exploration and washout. Risks & benefits of the procedure discussed with the patient, who wishes to proceed.   Findings:   1. Fournier's gangrene of the scrotum (right > left). Some involvement of the distal right inguinal canal. ~30% of scrotal skin was nonviable and resected. No involvement of perineum or perirectum.  2. Bilateral testicles viable. Debridement taken down to tunica albuginea bilaterally  Patient was taken to the OR and general anesthesia was induced. He had previously received vancomycin and ceftriaxone. He was placed into the high lithotomy position and prepped and draped in usual standard fashion. His existing right femoral central line was prepped and removed as this was in the operative field.   We began by making a right hemiscrotal incision from a point near the external ring longitudinally towards the posterior scrotum. There was immediate expression of malodorous pus. An aerobic and anaerobic swab  culture was sent from this initial expression. Using a scalpel we continued our incision through nonviable subcutaneous tissue towards the right testicle. The right testicle was delivered into the field and blunt finger dissection was used debride a majority of devitalized tissue. We debrided multiple packets of tissue as they approached the external ring but there did appear to be healthy backbleeding in this region. We carried our dissection across the median scrotal septum and delivered the left testicle. This space appeared less involved although was certainly infected with a large degree of devitalized tissue. Similar debridement was performed. All visible nonviable tissue was resected bluntly and with sharp excision. This was carried until healthy tissue and backbleeding were encountered. We used a pulse lavage with gentamicin irrigation to further debride all exposed surface area. Hemostasis was ensured with bovie electrocautery. We then packed the wound with x2 full Kerlix saline soaked gauzes. 4x4 gauze and abdominal pads were placed over top with mesh underwear. A 37F foley catheter was placed under sterile conditions and 10cc sterile water instilled.   Plan: 1. Plan to admit to step-down post op, hospitalist team notified of patient 2. Tentative plan for take-back to OR Sunday for second look 3. RN may reinforce dressing as needed. Not necessary to change packing until return to OR

## 2017-10-20 NOTE — Progress Notes (Signed)
Patient refusing scheduled Heparin. Dr. Sloan Leiter notified and aware.

## 2017-10-20 NOTE — Progress Notes (Signed)
PROGRESS NOTE        PATIENT DETAILS Name: Brent Daniels Age: 32 y.o. Sex: male Date of Birth: February 08, 1985 Admit Date: 10/16/2017 Admitting Physician Jani Gravel, MD FBP:ZWCH, Costella Hatcher, MD  Brief Narrative: Patient is a 32 y.o. male with prior history of DM-1, gastroparesis, CKD stage V-admitted to [facility] on 9/10 sepsis secondary to scrotal cellulitis, AKI on CKD stage V-patient was started on broad-spectrum IV antimicrobial therapy, IV fluids and other supportive care-with these measures, patient's renal function slowly improved, however he developed Fournier's gangrene, patient was transferred to [facility] on 9/13 and underwent scrotal exploration and debridement on 9/14.  See below for further details.  Subjective: Some pain at the groin area/scrotum-but is awake-alert and calm.  Assessment/Plan: Sepsis secondary to scrotal cellulitis with progression to Fournier's gangrene: Sepsis pathophysiology has improved, low-grade fever yesterday-continues to have significant leukocytosis.  Underwent scrotal debridement on early morning of 9/14-remains on empiric antimicrobial therapy with vancomycin/cefepime and Flagyl, urology planning on back to the OR on 9/15 for second look.  Cultures negative so far.  AKI on CKD stage V: AKI likely hemodynamically mediated in the setting of sepsis, improving with IV fluids.  Electrolytes stable-with good urine output-no indication for HD.  Baseline creatinine appears to be between 4-5.  Nephrology had followed at [facility]-we will monitor closely and if needed we will consult nephrology.  Patient has had numerous admissions at [facility] for acute kidney injury which improves with supportive care.  DM 1: CBG slightly on the higher side-but patient was n.p.o. for debridement-continue current dosing of Lantus 13 units twice daily, along with pre-meal NovoLog 3 units and SSI-we will reassess tomorrow and adjust accordingly.  Hypertension: Blood  pressure seems to be reasonably stable with clonidine, amlodipine, will switch IV metoprolol to back to Coreg.  Follow.  Intractable vomiting: Thought to be due to exacerbation of gastroparesis in the setting of acute meds-seems to with scheduled Reglan-and as needed Zofran/Phenergan-if clinical improvement continues-suspect Reglan could be made as needed in the next day or so.  Normocytic anemia: Suspect secondary to chronic disease, worsened slightly due to acute illness.  Follow.  Opioid dependence: Continue Suboxone.  Per prior note from Dr. Richrd Sox was last filled on 9/9-1-week supply.  Tobacco abuse: Counseled.  Telemetry (independently reviewed):Sinus Rhythm  DVT Prophylaxis: Prophylactic Heparin   Code Status: Full code   Family Communication: None at bedside  Disposition Plan: Remain inpatient-home sometime next week.  Antimicrobial agents: Anti-infectives (From admission, onward)   Start     Dose/Rate Route Frequency Ordered Stop   10/19/17 2230  ceFEPIme (MAXIPIME) 1 g in sodium chloride 0.9 % 100 mL IVPB     1 g 200 mL/hr over 30 Minutes Intravenous Every 24 hours 10/19/17 2217     10/19/17 2215  gentamicin (GARAMYCIN) 80 mg in sodium chloride 0.9 % 500 mL irrigation      Irrigation To Surgery 10/19/17 2200 10/19/17 2325   10/19/17 2215  gentamicin (GARAMYCIN) 80 mg in sodium chloride 0.9 % 500 mL irrigation      Irrigation To Surgery 10/19/17 2202 10/19/17 2324   10/19/17 2215  metroNIDAZOLE (FLAGYL) IVPB 500 mg     500 mg 100 mL/hr over 60 Minutes Intravenous Every 8 hours 10/19/17 2216     10/18/17 1200  vancomycin (VANCOCIN) IVPB 1000 mg/200 mL premix  1,000 mg 200 mL/hr over 60 Minutes Intravenous Every 48 hours 10/16/17 1438     10/18/17 1000  doxycycline (VIBRA-TABS) tablet 100 mg     100 mg Oral Every 12 hours 10/18/17 0933     10/17/17 0900  doxycycline (VIBRAMYCIN) 100 mg in sodium chloride 0.9 % 250 mL IVPB  Status:  Discontinued     100 mg 125  mL/hr over 120 Minutes Intravenous Every 12 hours 10/17/17 0749 10/18/17 0933   10/16/17 2000  ceFEPIme (MAXIPIME) 2 g in sodium chloride 0.9 % 100 mL IVPB     2 g 200 mL/hr over 30 Minutes Intravenous  Once 10/16/17 1937 10/16/17 2200   10/16/17 1500  vancomycin (VANCOCIN) IVPB 1000 mg/200 mL premix     1,000 mg 200 mL/hr over 60 Minutes Intravenous  Once 10/16/17 1356 10/16/17 1654   10/16/17 1245  vancomycin (VANCOCIN) IVPB 1000 mg/200 mL premix     1,000 mg 200 mL/hr over 60 Minutes Intravenous  Once 10/16/17 1239 10/16/17 1457   10/16/17 1245  cefTRIAXone (ROCEPHIN) 2 g in sodium chloride 0.9 % 100 mL IVPB  Status:  Discontinued     2 g 200 mL/hr over 30 Minutes Intravenous Every 24 hours 10/16/17 1239 10/19/17 2222      Procedures: 9/14>> Scrotal exploration +washout, Debridement of devitalized tissue   CONSULTS:  urology  Time spent: 35 minutes-Greater than 50% of this time was spent in counseling, explanation of diagnosis, planning of further management, and coordination of care.  MEDICATIONS: Scheduled Meds: . sodium chloride   Intravenous Once  . amLODipine  10 mg Oral Daily  . buprenorphine-naloxone  1 tablet Sublingual BID  . Chlorhexidine Gluconate Cloth  6 each Topical Daily  . cloNIDine  0.3 mg Transdermal Weekly  . doxycycline  100 mg Oral Q12H  . heparin  5,000 Units Subcutaneous Q8H  . HYDROmorphone      . HYDROmorphone      . insulin aspart  0-5 Units Subcutaneous QHS  . insulin aspart  0-9 Units Subcutaneous TID WC  . insulin aspart  3 Units Subcutaneous TID WC  . insulin glargine  13 Units Subcutaneous BID  . metoCLOPramide (REGLAN) injection  5 mg Intravenous Q8H  . metoprolol tartrate  5 mg Intravenous Q6H  . pantoprazole  40 mg Oral Daily  . polyethylene glycol  17 g Oral BID   Continuous Infusions: . sodium chloride    . sodium chloride 125 mL/hr at 10/20/17 0817  . ceFEPime (MAXIPIME) IV 1 g (10/20/17 0402)  . metronidazole 500 mg (10/20/17  0448)  . vancomycin Stopped (10/18/17 1430)   PRN Meds:.sodium chloride, haloperidol lactate, ondansetron (ZOFRAN) IV, promethazine, tiZANidine   PHYSICAL EXAM: Vital signs: Vitals:   10/20/17 0110 10/20/17 0123 10/20/17 0637 10/20/17 0850  BP: (!) 150/88 (!) 155/91 120/79   Pulse: 99  78   Resp: 13  11   Temp: 99 F (37.2 C) 98.3 F (36.8 C)  98 F (36.7 C)  TempSrc:  Oral  Oral  SpO2: 96%  98%   Weight:      Height:       Filed Weights   10/18/17 0500 10/19/17 0500 10/19/17 2058  Weight: 87.9 kg 88.9 kg 88.5 kg   Body mass index is 24.78 kg/m.   General appearance :Awake, alert, not in any distress. Speech Clear. Not toxic Looking Eyes:pupils equally reactive to light and accomodation,no scleral icterus.Pink conjunctiva HEENT: Atraumatic and Normocephalic Neck: supple Resp:Good air entry bilaterally,  no added sounds  CVS: S1 S2 regular, no murmurs.  GI: Bowel sounds present, Non tender and not distended with no gaurding, rigidity or rebound.No organomegaly.  Groin area with dressing in place with some mild soakage.  Foley catheter in place Extremities: B/L Lower Ext shows no edema, both legs are warm to touch Neurology:  speech clear,Non focal, sensation is grossly intact. Psychiatric: Normal judgment and insight. Alert and oriented x 3. Normal mood. Musculoskeletal:No digital cyanosis Skin:No Rash, warm and dry Wounds:N/A  I have personally reviewed following labs and imaging studies  LABORATORY DATA: CBC: Recent Labs  Lab 10/16/17 1250 10/17/17 0342 10/19/17 0424 10/20/17 0253  WBC 28.5* 28.5* 27.5* 28.9*  NEUTROABS 25.4*  --   --   --   HGB 8.5* 8.5* 7.5* 9.1*  HCT 25.8* 26.1* 22.8* 28.4*  MCV 89.9 90.3 89.1 91.3  PLT 350 356 396 277    Basic Metabolic Panel: Recent Labs  Lab 10/18/17 0002 10/18/17 0536 10/18/17 1302 10/19/17 0424 10/20/17 0253  NA 134* 133* 131* 132* 135  K 3.8 4.3 4.2 3.8 4.2  CL 98 94* 94* 98 103  CO2 22 20* 22 22 19*    GLUCOSE 138* 423* 364* 325* 276*  BUN 55* 58* 58* 57* 56*  CREATININE 7.01* 6.95* 6.74* 6.42*  6.40* 6.11*  CALCIUM 8.9 9.1 8.9 8.5* 8.5*  PHOS  --   --   --  2.7  --     GFR: Estimated Creatinine Clearance: 20 mL/min (A) (by C-G formula based on SCr of 6.11 mg/dL (H)).  Liver Function Tests: Recent Labs  Lab 10/16/17 1250 10/19/17 0424  AST 18  --   ALT 7  --   ALKPHOS 88  --   BILITOT 0.8  --   PROT 7.8  --   ALBUMIN 3.7 2.4*   No results for input(s): LIPASE, AMYLASE in the last 168 hours. No results for input(s): AMMONIA in the last 168 hours.  Coagulation Profile: No results for input(s): INR, PROTIME in the last 168 hours.  Cardiac Enzymes: No results for input(s): CKTOTAL, CKMB, CKMBINDEX, TROPONINI in the last 168 hours.  BNP (last 3 results) No results for input(s): PROBNP in the last 8760 hours.  HbA1C: No results for input(s): HGBA1C in the last 72 hours.  CBG: Recent Labs  Lab 10/19/17 1702 10/19/17 2127 10/20/17 0027 10/20/17 0353 10/20/17 0748  GLUCAP 204* 192* 174* 271* 232*    Lipid Profile: No results for input(s): CHOL, HDL, LDLCALC, TRIG, CHOLHDL, LDLDIRECT in the last 72 hours.  Thyroid Function Tests: No results for input(s): TSH, T4TOTAL, FREET4, T3FREE, THYROIDAB in the last 72 hours.  Anemia Panel: No results for input(s): VITAMINB12, FOLATE, FERRITIN, TIBC, IRON, RETICCTPCT in the last 72 hours.  Urine analysis:    Component Value Date/Time   COLORURINE YELLOW 10/17/2017 0125   APPEARANCEUR HAZY (A) 10/17/2017 0125   LABSPEC 1.014 10/17/2017 0125   PHURINE 5.0 10/17/2017 0125   GLUCOSEU >=500 (A) 10/17/2017 0125   HGBUR MODERATE (A) 10/17/2017 0125   BILIRUBINUR NEGATIVE 10/17/2017 0125   KETONESUR 20 (A) 10/17/2017 0125   PROTEINUR 100 (A) 10/17/2017 0125   UROBILINOGEN 1.0 11/13/2014 0155   NITRITE NEGATIVE 10/17/2017 0125   LEUKOCYTESUR NEGATIVE 10/17/2017 0125    Sepsis Labs: Lactic Acid, Venous    Component  Value Date/Time   LATICACIDVEN 1.58 10/16/2017 1318    MICROBIOLOGY: Recent Results (from the past 240 hour(s))  Blood Culture (routine x 2)  Status: None (Preliminary result)   Collection Time: 10/16/17 12:50 PM  Result Value Ref Range Status   Specimen Description BLOOD LEFT ANTECUBITAL  Final   Special Requests   Final    BOTTLES DRAWN AEROBIC AND ANAEROBIC Blood Culture results may not be optimal due to an inadequate volume of blood received in culture bottles   Culture   Final    NO GROWTH 3 DAYS Performed at [facility], [address] [place], [place] [address]    Report Status PENDING  Incomplete  Blood Culture (routine x 2)     Status: None (Preliminary result)   Collection Time: 10/16/17  9:07 PM  Result Value Ref Range Status   Specimen Description LEFT ANTECUBITAL  Final   Special Requests   Final    BOTTLES DRAWN AEROBIC AND ANAEROBIC Blood Culture adequate volume   Culture   Final    NO GROWTH 3 DAYS Performed at [facility], [address], [place], [place] [address]    Report Status PENDING  Incomplete  MRSA PCR Screening     Status: None   Collection Time: 10/17/17  4:37 AM  Result Value Ref Range Status   MRSA by PCR NEGATIVE NEGATIVE Final    Comment:        The GeneXpert MRSA Assay (FDA approved for NASAL specimens only), is one component of a comprehensive MRSA colonization surveillance program. It is not intended to diagnose MRSA infection nor to guide or monitor treatment for MRSA infections. Performed at [facility], [address], [place], [place] [address]   Culture, blood (routine x 2)     Status: None (Preliminary result)   Collection Time: 10/18/17  6:05 PM  Result Value Ref Range Status   Specimen Description BLOOD LEFT HAND  Final   Special Requests   Final    BOTTLES DRAWN AEROBIC AND ANAEROBIC Blood Culture adequate volume   Culture   Final    NO GROWTH < 24 HOURS Performed at [facility], [address], [place], [place]  [address]    Report Status PENDING  Incomplete  Culture, blood (routine x 2)     Status: None (Preliminary result)   Collection Time: 10/18/17  6:09 PM  Result Value Ref Range Status   Specimen Description BLOOD LEFT HAND  Final   Special Requests Blood Culture adequate volume  Final   Culture   Final    NO GROWTH < 24 HOURS Performed at [facility], [address] [place], [place] [address]    Report Status PENDING  Incomplete  Aerobic/Anaerobic Culture (surgical/deep wound)     Status: None (Preliminary result)   Collection Time: 10/19/17 11:27 PM  Result Value Ref Range Status   Specimen Description ABSCESS SCROTUM  Final   Special Requests PATIENT ON FOLLOWING VANC DOXYCYCLINE CEFEPIME  Final   Gram Stain   Final    ABUNDANT WBC PRESENT, PREDOMINANTLY PMN ABUNDANT GRAM NEGATIVE RODS ABUNDANT GRAM POSITIVE COCCI Performed at [facility], [place] [address], [place], [place] [address]    Culture PENDING  Incomplete   Report Status PENDING  Incomplete    RADIOLOGY STUDIES/RESULTS: Ct Abdomen Pelvis Wo Contrast  Result Date: 10/16/2017 CLINICAL DATA:  Four-day history of pain, erythema and swelling involving the RIGHT hemiscrotum, associated with fever. Current history of end-stage renal disease for which the patient had a an AV fistula placed on 04/09/2017. Surgical history includes cholecystectomy. EXAM: CT ABDOMEN AND PELVIS WITHOUT CONTRAST TECHNIQUE: Multidetector CT imaging  of the abdomen and pelvis was performed following the standard protocol without IV contrast. COMPARISON:  07/10/2017, 05/29/2017 and earlier. FINDINGS: Lower chest: Minimal linear scar/atelectasis in the lingula and both lower lobes. Visualized lung bases otherwise clear. Resolution of RIGHT LOWER LOBE pneumonia since 07/10/2017. Heart mildly enlarged with RIGHT coronary artery atherosclerosis. Hepatobiliary: Normal unenhanced appearance of the liver. Surgically absent gallbladder. No unexpected biliary ductal  dilation. Pancreas: Normal unenhanced appearance. Spleen: Normal unenhanced appearance. Adrenals/Urinary Tract: Normal appearing adrenal glands. No evidence of urinary tract calculi or obstruction on either side. Within the limits of the unenhanced technique, no focal parenchymal abnormality involving either kidney. Normal appearing urinary bladder. Stomach/Bowel: Thickening of the wall of the distal esophagus to the level of the EG junction as noted previously. Normal appearing decompressed stomach. Normal-appearing small bowel. Normal-appearing colon with expected stool burden. Normal appendix in the RIGHT UPPER pelvis. Vascular/Lymphatic: Moderate aorto-iliofemoral atherosclerosis without evidence of aneurysm. Enlarged LEFT periaortic retroperitoneal lymph nodes, the largest node measuring approximately 2.3 x 2.1 cm (series 2, image 34), increased in size since the most recent prior CT 07/10/2017 where it measured 1.8 x 1.7 cm at the same level. Multiple upper normal size to slightly enlarged BILATERAL superficial inguinal lymph nodes, increased in size since the most recent prior CT. Reproductive: Prostate gland and seminal vesicles normal in size and appearance for age. Extensive edema/inflammation involving the RIGHT hemiscrotum with marked skin thickening and a likely fluid collection within the thickened skin measuring approximately 1.6 cm (series 3, image 18). Mild edema/inflammation involving the contralateral LEFT hemiscrotum. Large BILATERAL hydroceles. No evidence of gas within the scrotal tissues. Other: None. Musculoskeletal: Large Schmorl's node in the UPPER endplate of J67 with mild compression deformity of the UPPER endplate of H41, unchanged. No new/acute findings. IMPRESSION: 1. Edema/inflammation involving the RIGHT hemiscrotum with marked skin thickening and a likely abscess within the thickened skin. Edema/inflammation to a lesser degree involves the contralateral LEFT hemiscrotum. Large  BILATERAL hydroceles are present. No evidence of gas within the scrotal tissues to suggest Fournier's gangrene. Scrotal ultrasound is recommended in further evaluation to better delineate these findings. 2. Enlarging LEFT periaortic lymph nodes and BILATERAL superficial inguinal lymph nodes since the most recent prior CT 3 months ago. While these are statistically likely reactive, a low-grade lymphoproliferative disorder could have a similar appearance. 3. Stable marked thickening of the wall of the visualized distal esophagus to the level of the EG junction dating back to at least 2017, likely chronic esophagitis. 4.  Aortic Atherosclerosis, advanced for patient age.  (ICD10-170.0) Electronically Signed   By: Evangeline Dakin M.D.   On: 10/16/2017 15:10   US Scrotum W/doppler  Result Date: 10/16/2017 CLINICAL DATA:  Right-sided pain with edema and redness over the last 4 days EXAM: SCROTAL ULTRASOUND DOPPLER ULTRASOUND OF THE TESTICLES TECHNIQUE: Complete ultrasound examination of the testicles, epididymis, and other scrotal structures was performed. Color and spectral Doppler ultrasound were also utilized to evaluate blood flow to the testicles. COMPARISON:  CT abdomen pelvis of 10/16/2016 FINDINGS: Right testicle Measurements: The right testicle measures 3.9 x 2.4 x 2.6 cm. No intratesticular abnormality is seen. There is blood flow to the right testicle with arterial and venous waveforms. Left testicle Measurements: The left testicle measures 4.2 x 2.7 x 2.6 cm. No intratesticular abnormality is seen. Blood flow is demonstrated to the left testicle with arterial and venous waveforms. Right epididymis:  The right epididymis is unremarkable. Left epididymis: The left epididymis appears enlarged and inhomogeneous as  well as hypervascular suggesting left epididymitis. Hydrocele: There is a small right hydrocele present which appears partially septated. Varicocele:  No varicocele is seen. Pulsed Doppler  interrogation of both testes demonstrates normal low resistance arterial and venous waveforms bilaterally. There is diffuse scrotal edema and skin thickening present. No discrete abscess is detected. IMPRESSION: 1. Diffusely edematous scrotal wall.  No abscess. 2. Irregular and enlarged left epididymis with hypervascularity suggestive of left epididymitis. 3. No intratesticular abnormality is seen. Blood flow is demonstrated to both testicles with arterial and venous waveforms. 4. Small right hydrocele which appears septated. Electronically Signed   By: Ivar Drape M.D.   On: 10/16/2017 16:22     LOS: 4 days   Oren Binet, MD  [facility]  If 7PM-7AM, please contact night-coverage  Please page via [email or web address]-Password [email or web address]-click on MD name and type text message  10/20/2017, 10:24 AM

## 2017-10-20 NOTE — Patient Care Conference (Signed)
Patient received from OR. Patient is alert and oriented.  vital signs are stable. Patient is on stepdown care. Iv fluids is running. Dressing is clean and dry. Will continue monitor the patient.

## 2017-10-20 NOTE — Progress Notes (Signed)
Patient had no ordered pain medications. Dr. Sloan Leiter paged and verbal orders received for Oxy 5mg  Q6 for moderate pain and Morphine 2mg  Q4 for severe pain. Patient has listed allergies to Morphine, Hydrocodone, and Tramadol. Asked patient about these allergies and what his reactions are to these medications. He responded with "No, I'm not allergic to those and I don't know why they are listed." Called the pharmacist, Ronalee Belts, and told him the situation. He said to proceed with placing the orders and remove them as allergies. Placed orders as instructed. Will monitor.

## 2017-10-21 ENCOUNTER — Encounter (HOSPITAL_COMMUNITY)
Admission: EM | Disposition: A | Discharge status: Home Health | Payer: Self-pay | Source: Home / Self Care | Attending: Internal Medicine

## 2017-10-21 ENCOUNTER — Inpatient Hospital Stay (HOSPITAL_COMMUNITY): Payer: Managed Care, Other (non HMO) | Admitting: Certified Registered"

## 2017-10-21 DIAGNOSIS — A419 Sepsis, unspecified organism: Principal | ICD-10-CM

## 2017-10-21 DIAGNOSIS — E1065 Type 1 diabetes mellitus with hyperglycemia: Secondary | ICD-10-CM

## 2017-10-21 DIAGNOSIS — I10 Essential (primary) hypertension: Secondary | ICD-10-CM

## 2017-10-21 HISTORY — PX: IRRIGATION AND DEBRIDEMENT ABSCESS: SHX5252

## 2017-10-21 LAB — GLUCOSE, CAPILLARY
GLUCOSE-CAPILLARY: 110 mg/dL — AB (ref 70–99)
GLUCOSE-CAPILLARY: 78 mg/dL (ref 70–99)
GLUCOSE-CAPILLARY: 68 mg/dL — AB (ref 70–99)
Glucose-Capillary: 97 mg/dL (ref 70–99)
Glucose-Capillary: 67 mg/dL — ABNORMAL LOW (ref 70–99)

## 2017-10-21 LAB — BASIC METABOLIC PANEL
ANION GAP: 11 (ref 5–15)
BUN: 49 mg/dL — ABNORMAL HIGH (ref 6–20)
CHLORIDE: 107 mmol/L (ref 98–111)
CO2: 18 mmol/L — AB (ref 22–32)
Calcium: 8.2 mg/dL — ABNORMAL LOW (ref 8.9–10.3)
Creatinine, Ser: 5.39 mg/dL — ABNORMAL HIGH (ref 0.61–1.24)
GFR calc Af Amer: 15 mL/min — ABNORMAL LOW (ref >60)
GFR, EST NON AFRICAN AMERICAN: 13 mL/min — AB (ref >60)
GLUCOSE: 97 mg/dL (ref 70–99)
POTASSIUM: 3.5 mmol/L (ref 3.5–5.1)
Sodium: 136 mmol/L (ref 135–145)

## 2017-10-21 LAB — CULTURE, BLOOD (ROUTINE X 2)
Culture: NO GROWTH
Culture: NO GROWTH
Special Requests: ADEQUATE

## 2017-10-21 LAB — CBC
HEMATOCRIT: 28.3 % — AB (ref 39.0–52.0)
HEMOGLOBIN: 8.8 g/dL — AB (ref 13.0–17.0)
MCH: 28.8 pg (ref 26.0–34.0)
MCHC: 31.1 g/dL (ref 30.0–36.0)
MCV: 92.5 fL (ref 78.0–100.0)
PLATELETS: 458 10*3/uL — AB (ref 150–400)
RBC: 3.06 MIL/uL — AB (ref 4.22–5.81)
RDW: 17.2 % — ABNORMAL HIGH (ref 11.5–15.5)
WBC: 19.5 10*3/uL — ABNORMAL HIGH (ref 4.0–10.5)

## 2017-10-21 LAB — SURGICAL PCR SCREEN
MRSA, PCR: NEGATIVE
Staphylococcus aureus: POSITIVE — AB

## 2017-10-21 SURGERY — IRRIGATION AND DEBRIDEMENT ABSCESS
Anesthesia: General | Site: Scrotum

## 2017-10-21 MED ORDER — SODIUM CHLORIDE 0.9 % IR SOLN
Status: DC | PRN
  Administered 2017-10-21: 3000 mL

## 2017-10-21 MED ORDER — SODIUM BICARBONATE 8.4 % IV SOLN
INTRAVENOUS | Status: AC
  Filled 2017-10-21: qty 50

## 2017-10-21 MED ORDER — ONDANSETRON HCL 4 MG/2ML IJ SOLN
INTRAMUSCULAR | Status: AC
  Filled 2017-10-21: qty 2

## 2017-10-21 MED ORDER — GLYCOPYRROLATE PF 0.2 MG/ML IJ SOSY
PREFILLED_SYRINGE | INTRAMUSCULAR | Status: AC
  Filled 2017-10-21: qty 5

## 2017-10-21 MED ORDER — DEXMEDETOMIDINE HCL 200 MCG/2ML IV SOLN
INTRAVENOUS | Status: DC | PRN
  Administered 2017-10-21 (×4): 4 ug via INTRAVENOUS

## 2017-10-21 MED ORDER — NYSTATIN 100000 UNIT/GM EX CREA
TOPICAL_CREAM | CUTANEOUS | Status: DC | PRN
  Administered 2017-10-21: 1 via TOPICAL

## 2017-10-21 MED ORDER — SUCCINYLCHOLINE CHLORIDE 20 MG/ML IJ SOLN
INTRAMUSCULAR | Status: DC | PRN
  Administered 2017-10-21: 100 mg via INTRAVENOUS

## 2017-10-21 MED ORDER — HYDROMORPHONE HCL 1 MG/ML IJ SOLN
INTRAMUSCULAR | Status: DC | PRN
  Administered 2017-10-21: .5 mg via INTRAVENOUS

## 2017-10-21 MED ORDER — NYSTATIN 100000 UNIT/GM EX OINT
TOPICAL_OINTMENT | Freq: Two times a day (BID) | CUTANEOUS | Status: DC
  Administered 2017-10-21 – 2017-10-24 (×8): via TOPICAL
  Administered 2017-10-25: 1 via TOPICAL
  Administered 2017-10-26 – 2017-10-29 (×5): via TOPICAL
  Filled 2017-10-21 (×2): qty 15

## 2017-10-21 MED ORDER — ONDANSETRON HCL 4 MG/2ML IJ SOLN
INTRAMUSCULAR | Status: DC | PRN
  Administered 2017-10-21: 4 mg via INTRAVENOUS

## 2017-10-21 MED ORDER — PROPOFOL 10 MG/ML IV BOLUS
INTRAVENOUS | Status: DC | PRN
  Administered 2017-10-21: 200 mg via INTRAVENOUS

## 2017-10-21 MED ORDER — HYDROMORPHONE HCL 1 MG/ML IJ SOLN
0.2500 mg | INTRAMUSCULAR | Status: DC | PRN
  Administered 2017-10-21: 0.5 mg via INTRAVENOUS

## 2017-10-21 MED ORDER — SODIUM CHLORIDE 0.9 % IV SOLN
INTRAVENOUS | Status: DC
  Filled 2017-10-21 (×2): qty 2

## 2017-10-21 MED ORDER — FENTANYL CITRATE (PF) 250 MCG/5ML IJ SOLN
INTRAMUSCULAR | Status: AC
  Filled 2017-10-21: qty 5

## 2017-10-21 MED ORDER — PHENYLEPHRINE 40 MCG/ML (10ML) SYRINGE FOR IV PUSH (FOR BLOOD PRESSURE SUPPORT)
PREFILLED_SYRINGE | INTRAVENOUS | Status: AC
  Filled 2017-10-21: qty 10

## 2017-10-21 MED ORDER — DEXAMETHASONE SODIUM PHOSPHATE 10 MG/ML IJ SOLN
INTRAMUSCULAR | Status: AC
  Filled 2017-10-21: qty 3

## 2017-10-21 MED ORDER — SUGAMMADEX SODIUM 500 MG/5ML IV SOLN
INTRAVENOUS | Status: AC
  Filled 2017-10-21: qty 10

## 2017-10-21 MED ORDER — FENTANYL CITRATE (PF) 100 MCG/2ML IJ SOLN
INTRAMUSCULAR | Status: DC | PRN
  Administered 2017-10-21: 100 ug via INTRAVENOUS
  Administered 2017-10-21 (×4): 50 ug via INTRAVENOUS

## 2017-10-21 MED ORDER — SODIUM CHLORIDE 0.9 % IJ SOLN
INTRAMUSCULAR | Status: AC
  Filled 2017-10-21: qty 30

## 2017-10-21 MED ORDER — HYDROMORPHONE HCL 1 MG/ML IJ SOLN
INTRAMUSCULAR | Status: AC
  Administered 2017-10-21: 0.5 mg via INTRAVENOUS
  Filled 2017-10-21: qty 1

## 2017-10-21 MED ORDER — 0.9 % SODIUM CHLORIDE (POUR BTL) OPTIME
TOPICAL | Status: DC | PRN
  Administered 2017-10-21: 1000 mL

## 2017-10-21 MED ORDER — ACETAMINOPHEN 10 MG/ML IV SOLN
INTRAVENOUS | Status: AC
  Filled 2017-10-21: qty 100

## 2017-10-21 MED ORDER — PROMETHAZINE HCL 25 MG/ML IJ SOLN
6.2500 mg | INTRAMUSCULAR | Status: AC | PRN
  Administered 2017-10-21 (×2): 12.5 mg via INTRAVENOUS

## 2017-10-21 MED ORDER — CHLORHEXIDINE GLUCONATE CLOTH 2 % EX PADS
6.0000 | MEDICATED_PAD | Freq: Every day | CUTANEOUS | Status: AC
  Administered 2017-10-21 – 2017-10-23 (×4): 6 via TOPICAL

## 2017-10-21 MED ORDER — PHENYLEPHRINE HCL 10 MG/ML IJ SOLN
INTRAMUSCULAR | Status: DC | PRN
  Administered 2017-10-21: 80 ug via INTRAVENOUS

## 2017-10-21 MED ORDER — MUPIROCIN 2 % EX OINT
1.0000 "application " | TOPICAL_OINTMENT | Freq: Two times a day (BID) | CUTANEOUS | Status: AC
  Administered 2017-10-21 – 2017-10-24 (×8): 1 via NASAL
  Filled 2017-10-21 (×9): qty 22

## 2017-10-21 MED ORDER — NYSTATIN 100000 UNIT/GM EX CREA
TOPICAL_CREAM | CUTANEOUS | Status: DC
  Filled 2017-10-21: qty 15

## 2017-10-21 MED ORDER — SUCCINYLCHOLINE CHLORIDE 200 MG/10ML IV SOSY
PREFILLED_SYRINGE | INTRAVENOUS | Status: AC
  Filled 2017-10-21: qty 50

## 2017-10-21 MED ORDER — LIDOCAINE 2% (20 MG/ML) 5 ML SYRINGE
INTRAMUSCULAR | Status: AC
  Filled 2017-10-21: qty 5

## 2017-10-21 MED ORDER — MIDAZOLAM HCL 2 MG/2ML IJ SOLN
INTRAMUSCULAR | Status: AC
  Filled 2017-10-21: qty 2

## 2017-10-21 MED ORDER — ACETAMINOPHEN 10 MG/ML IV SOLN
INTRAVENOUS | Status: DC | PRN
  Administered 2017-10-21: 1000 mg via INTRAVENOUS

## 2017-10-21 MED ORDER — MIDAZOLAM HCL 5 MG/5ML IJ SOLN
INTRAMUSCULAR | Status: DC | PRN
  Administered 2017-10-21: 2 mg via INTRAVENOUS

## 2017-10-21 MED ORDER — PROMETHAZINE HCL 25 MG/ML IJ SOLN
INTRAMUSCULAR | Status: AC
  Administered 2017-10-21: 12.5 mg via INTRAVENOUS
  Filled 2017-10-21: qty 1

## 2017-10-21 MED ORDER — HYDROMORPHONE HCL 1 MG/ML IJ SOLN
INTRAMUSCULAR | Status: AC
  Filled 2017-10-21: qty 0.5

## 2017-10-21 MED ORDER — LIDOCAINE HCL (CARDIAC) PF 100 MG/5ML IV SOSY
PREFILLED_SYRINGE | INTRAVENOUS | Status: DC | PRN
  Administered 2017-10-21: 80 mg via INTRAVENOUS

## 2017-10-21 MED ORDER — SODIUM CHLORIDE 0.9 % IV SOLN
INTRAVENOUS | Status: DC | PRN
  Administered 2017-10-21: 500 mL

## 2017-10-21 SURGICAL SUPPLY — 31 items
BAG URO CATCHER STRL LF (MISCELLANEOUS) ×2 IMPLANT
BANDAGE ELASTIC 6 VELCRO ST LF (GAUZE/BANDAGES/DRESSINGS) ×2 IMPLANT
COVER SURGICAL LIGHT HANDLE (MISCELLANEOUS) ×3 IMPLANT
DRAIN PENROSE 1/2X12 LTX STRL (WOUND CARE) ×4 IMPLANT
DRESSING AQUACEL AG SP 3.5X6 (GAUZE/BANDAGES/DRESSINGS) IMPLANT
DRSG AQUACEL AG SP 3.5X6 (GAUZE/BANDAGES/DRESSINGS) ×3
ELECT CAUTERY BLADE 6.4 (BLADE) ×3 IMPLANT
ELECT REM PT RETURN 9FT ADLT (ELECTROSURGICAL) ×3
ELECTRODE REM PT RTRN 9FT ADLT (ELECTROSURGICAL) ×1 IMPLANT
GAUZE SPONGE 4X4 12PLY STRL (GAUZE/BANDAGES/DRESSINGS) ×2 IMPLANT
GLOVE SURG SIGNA 7.5 PF LTX (GLOVE) ×3 IMPLANT
GOWN STRL REUS W/ TWL LRG LVL3 (GOWN DISPOSABLE) ×1 IMPLANT
GOWN STRL REUS W/ TWL XL LVL3 (GOWN DISPOSABLE) ×1 IMPLANT
GOWN STRL REUS W/TWL LRG LVL3 (GOWN DISPOSABLE) ×3
GOWN STRL REUS W/TWL XL LVL3 (GOWN DISPOSABLE) ×6
KIT BASIN OR (CUSTOM PROCEDURE TRAY) ×3 IMPLANT
KIT TURNOVER KIT B (KITS) ×3 IMPLANT
NS IRRIG 1000ML POUR BTL (IV SOLUTION) ×3 IMPLANT
PACK CYSTO (CUSTOM PROCEDURE TRAY) ×2 IMPLANT
PACK LITHOTOMY IV (CUSTOM PROCEDURE TRAY) ×3 IMPLANT
PAD ARMBOARD 7.5X6 YLW CONV (MISCELLANEOUS) ×5 IMPLANT
PENCIL BUTTON HOLSTER BLD 10FT (ELECTRODE) ×3 IMPLANT
SPONGE LAP 18X18 X RAY DECT (DISPOSABLE) ×3 IMPLANT
SUT ETHILON 2 0 FS 18 (SUTURE) ×8 IMPLANT
TAPE STRIPS DRAPE STRL (GAUZE/BANDAGES/DRESSINGS) ×2 IMPLANT
TIP IRRIG INTERPULSE W/COAXIAL (MISCELLANEOUS) ×2 IMPLANT
TOWEL OR 17X24 6PK STRL BLUE (TOWEL DISPOSABLE) ×3 IMPLANT
TUBE CONNECTING 12'X1/4 (SUCTIONS) ×1
TUBE CONNECTING 12X1/4 (SUCTIONS) ×2 IMPLANT
UNDERPAD 30X30 (UNDERPADS AND DIAPERS) ×3 IMPLANT
YANKAUER SUCT BULB TIP NO VENT (SUCTIONS) ×3 IMPLANT

## 2017-10-21 NOTE — Anesthesia Postprocedure Evaluation (Signed)
Anesthesia Post Note  Patient: MARQUIST BINSTOCK  Procedure(s) Performed: INCISION AND DRAINAGE ABSCESS SCROTAL EXPLORATION,WASHOUT, DEBRIBEMENT OF TISSUE (N/A Scrotum)     Patient location during evaluation: PACU Anesthesia Type: General Level of consciousness: awake and alert Pain management: pain level controlled Vital Signs Assessment: post-procedure vital signs reviewed and stable Respiratory status: spontaneous breathing, nonlabored ventilation, respiratory function stable and patient connected to nasal cannula oxygen Cardiovascular status: blood pressure returned to baseline and stable Postop Assessment: no apparent nausea or vomiting Anesthetic complications: no    Last Vitals:  Vitals:   10/21/17 0923 10/21/17 0947  BP: 129/77 125/74  Pulse: 82 79  Resp: 14 12  Temp:  36.7 C  SpO2: 98% 97%    Last Pain:  Vitals:   10/21/17 0947  TempSrc: Oral  PainSc:                  Tiajuana Amass

## 2017-10-21 NOTE — Anesthesia Postprocedure Evaluation (Signed)
Anesthesia Post Note  Patient: Brent Daniels  Procedure(s) Performed: IRRIGATION AND DEBRIDEMENT ABSCESS (N/A Scrotum)     Patient location during evaluation: PACU Anesthesia Type: General Level of consciousness: awake and alert Pain management: pain level controlled Vital Signs Assessment: post-procedure vital signs reviewed and stable Respiratory status: spontaneous breathing, nonlabored ventilation, respiratory function stable and patient connected to nasal cannula oxygen Cardiovascular status: blood pressure returned to baseline and stable Postop Assessment: no apparent nausea or vomiting Anesthetic complications: no    Last Vitals:  Vitals:   10/21/17 0923 10/21/17 0947  BP: 129/77 125/74  Pulse: 82 79  Resp: 14 12  Temp:  36.7 C  SpO2: 98% 97%    Last Pain:  Vitals:   10/21/17 0947  TempSrc: Oral  PainSc:                  Tiajuana Amass

## 2017-10-21 NOTE — Op Note (Addendum)
Preoperative Diagnosis: fournier's gangrene of scrotum  Postoperative Diagnosis: Same  Procedure(s) Performed:  - Debridement of scrotum and washout - Primary closure of complex wound  Surgeon:  Surgeon(s): Ardis Hughs, MD  Resident Surgeon: Fredricka Bonine, MD  Assistant(s):  None  Anesthesia:  General  Fluids:  See anesthesia record  Estimated blood loss:  20 cc  Specimens:  None  Drains:  Penrose drains (x2) in scrotum  Complications:  None  Indications:  This is a 32 y.o. patient with a history of Fournier's s/p initial debridement on 10/20/17. After discussion of the risks & benefits and alternatives to surgical approach, the patient wishes to proceed with 2nd look debridement and washout. Risks & benefits of the procedure discussed with the patient, who wishes to proceed.   Findings:   1. Overall fairly healthy looking wound bed and underlying tissues. Both testicles healthy and viable appearing 2. We did need to excise a nearly circumferential rim of devitalized skin around the wound, as well as minor sharp debridement of remaining nonviable subcutaneum  3. Successful loose primary closure of scrotum  Patient was taken to the OR and general anesthesia was induced. He has been on treatment dose vanc/cefepime/flagyl which were therapeutic. He was placed into the high lithotomy position and prepped and draped in usual standard fashion.  The prior wound dressing were removed. The wound overall looked fairly good with largely pink underlying tissues. There was a ~1-2 cm rim of nonviable skin that required sharp excision. We additional sharply debrided several more small pockets of necrotic subcutaneous tissue. Next, the wound was fully irrigated with a gentamicin loaded pulse lavage system. The tissue looked clean following this with healthy light backbleeding noticed in most areas. Next, we placed two penrose drains with the proximal end within each respective inguinal  canal. The wound edges were then approximated with interrupted 3-0 nylon stitches. This was a loose approximation to bring the wound to a full closure. Wound was cleaned and dried and gauze fluffs were applied. Nystatin cream was applied to skin around wound as it appeared to be affected by yeast infection as well. Mesh underwear was applied. Pre-existing foley catheter was left in place. This concluded the procedure.    Plan: 1. Return to floor bed 2. Continue empiric broad spectrum abx for now. Pending wound cultures 3. Nursing to change overlying gauze fluffs q8h and as needed.  4. We will remove penrose drains in stepwise fashion of the course of next week

## 2017-10-21 NOTE — Progress Notes (Signed)
PROGRESS NOTE        PATIENT DETAILS Name: Brent Daniels Age: 32 y.o. Sex: male Date of Birth: Dec 28, 1985 Admit Date: 10/16/2017 Admitting Physician Jani Gravel, MD LEX:NTZG, Costella Hatcher, MD  Brief Narrative: Patient is a 33 y.o. male with prior history of DM-1, gastroparesis, CKD stage V-admitted to [facility] on 9/10 sepsis secondary to scrotal cellulitis, AKI on CKD stage V-patient was started on broad-spectrum IV antimicrobial therapy, IV fluids and other supportive care-with these measures, patient's renal function slowly improved, however he developed Fournier's gangrene, patient was transferred to [facility] on 9/13 and underwent scrotal exploration and debridement on 9/14.  See below for further details.  Subjective:   Patient in bed, appears comfortable, denies any headache, no fever, no chest pain or pressure, no shortness of breath , no abdominal pain. No focal weakness.  Mild postop scrotal area discomfort but otherwise he feels OK.  Assessment/Plan:  Sepsis secondary to scrotal cellulitis with progression to Fournier's gangrene: Sepsis physiology has improved, he is currently on empiric IV vancomycin, cefepime along with Flagyl.  Continue this combination.  Urology on board he underwent first toe washout on 10/20/2013 and a second 1 on 10/21/2017, continue Foley catheter for now to avoid contamination.  Monitor cultures and clinically.  Clinically better.  ARF on CKD stage V: Baseline creatinine close to 4.7, ARF due to sepsis, improving with supportive care.  Continue gentle hydration and monitor.  Hypertension: Blood pressure seems to be reasonably stable with clonidine, amlodipine, will switch IV metoprolol to back to Coreg.  Follow.  Intractable vomiting: Thought to be due to exacerbation of gastroparesis in the setting of acute meds-seems to with scheduled Reglan-and as needed Zofran/Phenergan-if clinical improvement continues-suspect Reglan could be made  as needed in the next day or so.  Normocytic anemia: Suspect secondary to chronic disease, worsened slightly due to acute illness.  Follow.  Opioid dependence: Continue Suboxone.  Per prior note from Dr. Richrd Sox was last filled on 9/9-1-week supply.  Tobacco abuse: Counseled.  DM 1: Currently on Lantus twice daily along with sliding scale, monitor and adjust.  CBG (last 3)  Recent Labs    10/20/17 2240 10/21/17 0854 10/21/17 1232  GLUCAP 76 97 110*   Lab Results  Component Value Date   HGBA1C 8.1 (A) 09/19/2017      Telemetry (independently reviewed):Sinus Rhythm  DVT Prophylaxis: Prophylactic Heparin   Code Status: Full code   Family Communication: None at bedside  Disposition Plan: Remain inpatient-home sometime next week.  Antimicrobial agents: Anti-infectives (From admission, onward)   Start     Dose/Rate Route Frequency Ordered Stop   10/21/17 0802  gentamicin (GARAMYCIN) 80 mg in sodium chloride 0.9 % 500 mL irrigation  Status:  Discontinued       As needed 10/21/17 0820 10/21/17 0849   10/21/17 0645  gentamicin (GARAMYCIN) 80 mg in sodium chloride 0.9 % 500 mL irrigation      Irrigation To Surgery 10/21/17 0636 10/22/17 0645   10/19/17 2230  ceFEPIme (MAXIPIME) 1 g in sodium chloride 0.9 % 100 mL IVPB     1 g 200 mL/hr over 30 Minutes Intravenous Every 24 hours 10/19/17 2217     10/19/17 2215  gentamicin (GARAMYCIN) 80 mg in sodium chloride 0.9 % 500 mL irrigation      Irrigation To Surgery 10/19/17 2200 10/19/17  2325   10/19/17 2215  gentamicin (GARAMYCIN) 80 mg in sodium chloride 0.9 % 500 mL irrigation      Irrigation To Surgery 10/19/17 2202 10/19/17 2324   10/19/17 2215  metroNIDAZOLE (FLAGYL) IVPB 500 mg     500 mg 100 mL/hr over 60 Minutes Intravenous Every 8 hours 10/19/17 2216     10/18/17 1200  vancomycin (VANCOCIN) IVPB 1000 mg/200 mL premix     1,000 mg 200 mL/hr over 60 Minutes Intravenous Every 48 hours 10/16/17 1438     10/18/17 1000   doxycycline (VIBRA-TABS) tablet 100 mg  Status:  Discontinued     100 mg Oral Every 12 hours 10/18/17 0933 10/20/17 1043   10/17/17 0900  doxycycline (VIBRAMYCIN) 100 mg in sodium chloride 0.9 % 250 mL IVPB  Status:  Discontinued     100 mg 125 mL/hr over 120 Minutes Intravenous Every 12 hours 10/17/17 0749 10/18/17 0933   10/16/17 2000  ceFEPIme (MAXIPIME) 2 g in sodium chloride 0.9 % 100 mL IVPB     2 g 200 mL/hr over 30 Minutes Intravenous  Once 10/16/17 1937 10/16/17 2200   10/16/17 1500  vancomycin (VANCOCIN) IVPB 1000 mg/200 mL premix     1,000 mg 200 mL/hr over 60 Minutes Intravenous  Once 10/16/17 1356 10/16/17 1654   10/16/17 1245  vancomycin (VANCOCIN) IVPB 1000 mg/200 mL premix     1,000 mg 200 mL/hr over 60 Minutes Intravenous  Once 10/16/17 1239 10/16/17 1457   10/16/17 1245  cefTRIAXone (ROCEPHIN) 2 g in sodium chloride 0.9 % 100 mL IVPB  Status:  Discontinued     2 g 200 mL/hr over 30 Minutes Intravenous Every 24 hours 10/16/17 1239 10/19/17 2222      Procedures: 9/14>> Scrotal exploration +washout, Debridement of devitalized tissue  9/15 - 2nd washout  CONSULTS:  urology  Time spent: 35 minutes-Greater than 50% of this time was spent in counseling, explanation of diagnosis, planning of further management, and coordination of care.  MEDICATIONS: Scheduled Meds: . sodium chloride   Intravenous Once  . amLODipine  10 mg Oral Daily  . buprenorphine-naloxone  1 tablet Sublingual BID  . carvedilol  25 mg Oral BID WC  . Chlorhexidine Gluconate Cloth  6 each Topical Daily  . cloNIDine  0.3 mg Transdermal Weekly  . gentamicin irrigation   Irrigation To OR  . heparin  5,000 Units Subcutaneous Q8H  . insulin aspart  0-5 Units Subcutaneous QHS  . insulin aspart  0-9 Units Subcutaneous TID WC  . insulin aspart  3 Units Subcutaneous TID WC  . insulin glargine  13 Units Subcutaneous BID  . metoCLOPramide (REGLAN) injection  5 mg Intravenous Q8H  . mupirocin ointment  1  application Nasal BID  . nystatin cream   Topical To OR  . nystatin ointment   Topical BID  . pantoprazole  40 mg Oral Daily   Continuous Infusions: . sodium chloride    . sodium chloride 125 mL/hr at 10/21/17 1017  . ceFEPime (MAXIPIME) IV 1 g (10/20/17 2210)  . metronidazole 500 mg (10/21/17 6314)  . vancomycin 1,000 mg (10/20/17 1327)   PRN Meds:.sodium chloride, haloperidol lactate, hydrALAZINE, morphine injection, ondansetron (ZOFRAN) IV, oxyCODONE, promethazine, tiZANidine   PHYSICAL EXAM: Vital signs: Vitals:   10/21/17 0920 10/21/17 0923 10/21/17 0947 10/21/17 1233  BP:  129/77 125/74 123/78  Pulse:  82 79 77  Resp:  14 12 13   Temp: 98 F (36.7 C)  98 F (36.7 C) 98.1  F (36.7 C)  TempSrc:   Oral Oral  SpO2: 95% 98% 97% 96%  Weight:      Height:       Filed Weights   10/18/17 0500 10/19/17 0500 10/19/17 2058  Weight: 87.9 kg 88.9 kg 88.5 kg   Body mass index is 24.78 kg/m.   Exam  Awake Alert, Oriented X 3, No new F.N deficits, Normal affect [place].AT,PERRAL Supple Neck,No JVD, No cervical lymphadenopathy appriciated.  Symmetrical Chest wall movement, Good air movement bilaterally, CTAB RRR,No Gallops, Rubs or new Murmurs, No Parasternal Heave +ve B.Sounds, Abd Soft, No tenderness, No organomegaly appriciated, No rebound - guarding or rigidity. No Cyanosis, Clubbing or edema, No new Rash or bruise Scrotal area under bandage-Foley in place.  I have personally reviewed following labs and imaging studies  LABORATORY DATA: CBC: Recent Labs  Lab 10/16/17 1250 10/17/17 0342 10/19/17 0424 10/20/17 0253 10/20/17 1154 10/21/17 1004  WBC 28.5* 28.5* 27.5* 28.9* 28.0* 19.5*  NEUTROABS 25.4*  --   --   --   --   --   HGB 8.5* 8.5* 7.5* 9.1* 9.0* 8.8*  HCT 25.8* 26.1* 22.8* 28.4* 28.8* 28.3*  MCV 89.9 90.3 89.1 91.3 91.1 92.5  PLT 350 356 396 399 410* 458*    Basic Metabolic Panel: Recent Labs  Lab 10/18/17 1302 10/19/17 0424 10/20/17 0253  10/20/17 1154 10/21/17 1004  NA 131* 132* 135 136 136  K 4.2 3.8 4.2 4.1 3.5  CL 94* 98 103 102 107  CO2 22 22 19* 24 18*  GLUCOSE 364* 325* 276* 196* 97  BUN 58* 57* 56* 53* 49*  CREATININE 6.74* 6.42*  6.40* 6.11* 5.70* 5.39*  CALCIUM 8.9 8.5* 8.5* 8.7* 8.2*  PHOS  --  2.7  --   --   --     GFR: Estimated Creatinine Clearance: 22.7 mL/min (A) (by C-G formula based on SCr of 5.39 mg/dL (H)).  Liver Function Tests: Recent Labs  Lab 10/16/17 1250 10/19/17 0424  AST 18  --   ALT 7  --   ALKPHOS 88  --   BILITOT 0.8  --   PROT 7.8  --   ALBUMIN 3.7 2.4*   No results for input(s): LIPASE, AMYLASE in the last 168 hours. No results for input(s): AMMONIA in the last 168 hours.  Coagulation Profile: No results for input(s): INR, PROTIME in the last 168 hours.  Cardiac Enzymes: No results for input(s): CKTOTAL, CKMB, CKMBINDEX, TROPONINI in the last 168 hours.  BNP (last 3 results) No results for input(s): PROBNP in the last 8760 hours.  HbA1C: No results for input(s): HGBA1C in the last 72 hours.  CBG: Recent Labs  Lab 10/20/17 1212 10/20/17 1701 10/20/17 2240 10/21/17 0854 10/21/17 1232  GLUCAP 174* 138* 76 97 110*    Lipid Profile: No results for input(s): CHOL, HDL, LDLCALC, TRIG, CHOLHDL, LDLDIRECT in the last 72 hours.  Thyroid Function Tests: No results for input(s): TSH, T4TOTAL, FREET4, T3FREE, THYROIDAB in the last 72 hours.  Anemia Panel: No results for input(s): VITAMINB12, FOLATE, FERRITIN, TIBC, IRON, RETICCTPCT in the last 72 hours.  Urine analysis:    Component Value Date/Time   COLORURINE YELLOW 10/17/2017 0125   APPEARANCEUR HAZY (A) 10/17/2017 0125   LABSPEC 1.014 10/17/2017 0125   PHURINE 5.0 10/17/2017 0125   GLUCOSEU >=500 (A) 10/17/2017 0125   HGBUR MODERATE (A) 10/17/2017 0125   BILIRUBINUR NEGATIVE 10/17/2017 0125   KETONESUR 20 (A) 10/17/2017 0125   PROTEINUR 100 (  A) 10/17/2017 0125   UROBILINOGEN 1.0 11/13/2014 0155    NITRITE NEGATIVE 10/17/2017 0125   LEUKOCYTESUR NEGATIVE 10/17/2017 0125    Sepsis Labs: Lactic Acid, Venous    Component Value Date/Time   LATICACIDVEN 1.58 10/16/2017 1318    MICROBIOLOGY: Recent Results (from the past 240 hour(s))  Blood Culture (routine x 2)     Status: None   Collection Time: 10/16/17 12:50 PM  Result Value Ref Range Status   Specimen Description BLOOD LEFT ANTECUBITAL  Final   Special Requests   Final    BOTTLES DRAWN AEROBIC AND ANAEROBIC Blood Culture results may not be optimal due to an inadequate volume of blood received in culture bottles   Culture   Final    NO GROWTH 5 DAYS Performed at [facility], [address], [place], [place] [address]    Report Status 10/21/2017 FINAL  Final  Blood Culture (routine x 2)     Status: None   Collection Time: 10/16/17  9:07 PM  Result Value Ref Range Status   Specimen Description LEFT ANTECUBITAL  Final   Special Requests   Final    BOTTLES DRAWN AEROBIC AND ANAEROBIC Blood Culture adequate volume   Culture   Final    NO GROWTH 5 DAYS Performed at [facility], [address], [place], [place] [address]    Report Status 10/21/2017 FINAL  Final  MRSA PCR Screening     Status: None   Collection Time: 10/17/17  4:37 AM  Result Value Ref Range Status   MRSA by PCR NEGATIVE NEGATIVE Final    Comment:        The GeneXpert MRSA Assay (FDA approved for NASAL specimens only), is one component of a comprehensive MRSA colonization surveillance program. It is not intended to diagnose MRSA infection nor to guide or monitor treatment for MRSA infections. Performed at [facility], [address], [place], [place] [address]   Culture, blood (routine x 2)     Status: None (Preliminary result)   Collection Time: 10/18/17  6:05 PM  Result Value Ref Range Status   Specimen Description BLOOD LEFT HAND  Final   Special Requests   Final    BOTTLES DRAWN AEROBIC AND ANAEROBIC Blood Culture adequate volume   Culture    Final    NO GROWTH 3 DAYS Performed at [facility], [address], [place], [place] [address]    Report Status PENDING  Incomplete  Culture, blood (routine x 2)     Status: None (Preliminary result)   Collection Time: 10/18/17  6:09 PM  Result Value Ref Range Status   Specimen Description BLOOD LEFT HAND  Final   Special Requests Blood Culture adequate volume  Final   Culture   Final    NO GROWTH 3 DAYS Performed at [facility], [address] [place], [place] [address]    Report Status PENDING  Incomplete  Aerobic/Anaerobic Culture (surgical/deep wound)     Status: None (Preliminary result)   Collection Time: 10/19/17 11:27 PM  Result Value Ref Range Status   Specimen Description ABSCESS SCROTUM  Final   Special Requests PATIENT ON FOLLOWING VANC DOXYCYCLINE CEFEPIME  Final   Gram Stain   Final    ABUNDANT WBC PRESENT, PREDOMINANTLY PMN ABUNDANT GRAM NEGATIVE RODS ABUNDANT GRAM POSITIVE COCCI Performed at [facility], [place] [address], [place],  [address]    Culture PENDING  Incomplete   Report Status PENDING  Incomplete  Surgical pcr screen  Status: Abnormal   Collection Time: 10/20/17 10:21 PM  Result Value Ref Range Status   MRSA, PCR NEGATIVE NEGATIVE Final   Staphylococcus aureus POSITIVE (A) NEGATIVE Final    Comment: RESULT CALLED TO, READ BACK BY AND VERIFIED WITH: RN DYolanda Manges [phone] @0151  THANEY Performed at [facility] [address], [place], [place] [address]     RADIOLOGY STUDIES/RESULTS: Ct Abdomen Pelvis Wo Contrast  Result Date: 10/16/2017 CLINICAL DATA:  Four-day history of pain, erythema and swelling involving the RIGHT hemiscrotum, associated with fever. Current history of end-stage renal disease for which the patient had a an AV fistula placed on 04/09/2017. Surgical history includes cholecystectomy. EXAM: CT ABDOMEN AND PELVIS WITHOUT CONTRAST TECHNIQUE: Multidetector CT imaging of the abdomen and pelvis was performed  following the standard protocol without IV contrast. COMPARISON:  07/10/2017, 05/29/2017 and earlier. FINDINGS: Lower chest: Minimal linear scar/atelectasis in the lingula and both lower lobes. Visualized lung bases otherwise clear. Resolution of RIGHT LOWER LOBE pneumonia since 07/10/2017. Heart mildly enlarged with RIGHT coronary artery atherosclerosis. Hepatobiliary: Normal unenhanced appearance of the liver. Surgically absent gallbladder. No unexpected biliary ductal dilation. Pancreas: Normal unenhanced appearance. Spleen: Normal unenhanced appearance. Adrenals/Urinary Tract: Normal appearing adrenal glands. No evidence of urinary tract calculi or obstruction on either side. Within the limits of the unenhanced technique, no focal parenchymal abnormality involving either kidney. Normal appearing urinary bladder. Stomach/Bowel: Thickening of the wall of the distal esophagus to the level of the EG junction as noted previously. Normal appearing decompressed stomach. Normal-appearing small bowel. Normal-appearing colon with expected stool burden. Normal appendix in the RIGHT UPPER pelvis. Vascular/Lymphatic: Moderate aorto-iliofemoral atherosclerosis without evidence of aneurysm. Enlarged LEFT periaortic retroperitoneal lymph nodes, the largest node measuring approximately 2.3 x 2.1 cm (series 2, image 34), increased in size since the most recent prior CT 07/10/2017 where it measured 1.8 x 1.7 cm at the same level. Multiple upper normal size to slightly enlarged BILATERAL superficial inguinal lymph nodes, increased in size since the most recent prior CT. Reproductive: Prostate gland and seminal vesicles normal in size and appearance for age. Extensive edema/inflammation involving the RIGHT hemiscrotum with marked skin thickening and a likely fluid collection within the thickened skin measuring approximately 1.6 cm (series 3, image 18). Mild edema/inflammation involving the contralateral LEFT hemiscrotum. Large  BILATERAL hydroceles. No evidence of gas within the scrotal tissues. Other: None. Musculoskeletal: Large Schmorl's node in the UPPER endplate of D66 with mild compression deformity of the UPPER endplate of Y40, unchanged. No new/acute findings. IMPRESSION: 1. Edema/inflammation involving the RIGHT hemiscrotum with marked skin thickening and a likely abscess within the thickened skin. Edema/inflammation to a lesser degree involves the contralateral LEFT hemiscrotum. Large BILATERAL hydroceles are present. No evidence of gas within the scrotal tissues to suggest Fournier's gangrene. Scrotal ultrasound is recommended in further evaluation to better delineate these findings. 2. Enlarging LEFT periaortic lymph nodes and BILATERAL superficial inguinal lymph nodes since the most recent prior CT 3 months ago. While these are statistically likely reactive, a low-grade lymphoproliferative disorder could have a similar appearance. 3. Stable marked thickening of the wall of the visualized distal esophagus to the level of the EG junction dating back to at least 2017, likely chronic esophagitis. 4.  Aortic Atherosclerosis, advanced for patient age.  (ICD10-170.0) Electronically Signed   By: Evangeline Dakin M.D.   On: 10/16/2017 15:10   US Scrotum W/doppler  Result Date: 10/16/2017 CLINICAL DATA:  Right-sided pain with edema and redness over the last  4 days EXAM: SCROTAL ULTRASOUND DOPPLER ULTRASOUND OF THE TESTICLES TECHNIQUE: Complete ultrasound examination of the testicles, epididymis, and other scrotal structures was performed. Color and spectral Doppler ultrasound were also utilized to evaluate blood flow to the testicles. COMPARISON:  CT abdomen pelvis of 10/16/2016 FINDINGS: Right testicle Measurements: The right testicle measures 3.9 x 2.4 x 2.6 cm. No intratesticular abnormality is seen. There is blood flow to the right testicle with arterial and venous waveforms. Left testicle Measurements: The left testicle  measures 4.2 x 2.7 x 2.6 cm. No intratesticular abnormality is seen. Blood flow is demonstrated to the left testicle with arterial and venous waveforms. Right epididymis:  The right epididymis is unremarkable. Left epididymis: The left epididymis appears enlarged and inhomogeneous as well as hypervascular suggesting left epididymitis. Hydrocele: There is a small right hydrocele present which appears partially septated. Varicocele:  No varicocele is seen. Pulsed Doppler interrogation of both testes demonstrates normal low resistance arterial and venous waveforms bilaterally. There is diffuse scrotal edema and skin thickening present. No discrete abscess is detected. IMPRESSION: 1. Diffusely edematous scrotal wall.  No abscess. 2. Irregular and enlarged left epididymis with hypervascularity suggestive of left epididymitis. 3. No intratesticular abnormality is seen. Blood flow is demonstrated to both testicles with arterial and venous waveforms. 4. Small right hydrocele which appears septated. Electronically Signed   By: Ivar Drape M.D.   On: 10/16/2017 16:22     LOS: 5 days   Lala Lund, MD  [facility]  If 7PM-7AM, please contact night-coverage  Please page via [email or web address] [email or web address]-click on MD name and type text message  10/21/2017, 1:24 PM

## 2017-10-21 NOTE — Consult Note (Signed)
Patient was taken to the operating room today for a scrotal washout and debridement.  The skin was able to be closed primarily, albeit loosely.  2 Penrose drains were left behind.  The area was packed with gauze.  Recommendations: The patient should continue with the IV antibiotics.  The wound cultures are still pending.  The Penrose drains will be slowly removed over the course of several weeks.  The gauze dressing should be changed every 8 hours while the patient is in the hospital.  We will continue to see the patient on a daily basis.

## 2017-10-21 NOTE — Transfer of Care (Signed)
Immediate Anesthesia Transfer of Care Note  Patient: Brent Daniels  Procedure(s) Performed: IRRIGATION AND DEBRIDEMENT ABSCESS (N/A Scrotum)  Patient Location: PACU  Anesthesia Type:General  Level of Consciousness: awake, alert  and oriented  Airway & Oxygen Therapy: Patient Spontanous Breathing and Patient connected to nasal cannula oxygen  Post-op Assessment: Report given to RN, Post -op Vital signs reviewed and stable and Patient moving all extremities X 4  Post vital signs: Reviewed and stable  Last Vitals:  Vitals Value Taken Time  BP    Temp    Pulse 81 10/21/2017  8:53 AM  Resp 12 10/21/2017  8:53 AM  SpO2 99 % 10/21/2017  8:53 AM  Vitals shown include unvalidated device data.  Last Pain:  Vitals:   10/21/17 0633  TempSrc: Oral  PainSc:       Patients Stated Pain Goal: 2 (40/98/11 9147)  Complications: No apparent anesthesia complications

## 2017-10-21 NOTE — Progress Notes (Signed)
Patient back in room 5w bed 15 from PACU. Patient alert and oriented x 4. Patient agitated and wanting to get out of bed. Vital signs obtained. Will continue to monitor.

## 2017-10-21 NOTE — Anesthesia Procedure Notes (Signed)
Procedure Name: Intubation Date/Time: 10/21/2017 7:36 AM Performed by: Neldon Newport, CRNA Pre-anesthesia Checklist: Timeout performed, Patient being monitored, Suction available, Emergency Drugs available and Patient identified Patient Re-evaluated:Patient Re-evaluated prior to induction Oxygen Delivery Method: Circle system utilized Preoxygenation: Pre-oxygenation with 100% oxygen Induction Type: IV induction Ventilation: Mask ventilation without difficulty Laryngoscope Size: Mac and 3 Grade View: Grade I Tube type: Oral Tube size: 7.5 mm Number of attempts: 1 Placement Confirmation: breath sounds checked- equal and bilateral,  ETT inserted through vocal cords under direct vision and positive ETCO2 Secured at: 23 cm Tube secured with: Tape Dental Injury: Teeth and Oropharynx as per pre-operative assessment

## 2017-10-21 NOTE — Progress Notes (Signed)
Pt taken to surgery at 0648. Alert and oriented x4. VS stable.

## 2017-10-21 NOTE — Anesthesia Preprocedure Evaluation (Addendum)
Anesthesia Evaluation  Patient identified by MRN, date of birth, ID band Patient awake    Reviewed: Allergy & Precautions, NPO status , Patient's Chart, lab work & pertinent test results  Airway Mallampati: II  TM Distance: >3 FB Neck ROM: Full    Dental  (+) Dental Advisory Given, Poor Dentition   Pulmonary Current Smoker,    breath sounds clear to auscultation       Cardiovascular hypertension, Pt. on medications  Rhythm:Regular Rate:Normal     Neuro/Psych Anxiety Depression negative neurological ROS     GI/Hepatic Neg liver ROS, hiatal hernia, PUD, GERD  Medicated,  Endo/Other  diabetes, Poorly Controlled, Type 1, Insulin Dependent  Renal/GU CRFRenal disease     Musculoskeletal   Abdominal   Peds  Hematology  (+) anemia ,   Anesthesia Other Findings   Reproductive/Obstetrics                            Anesthesia Physical  Anesthesia Plan  ASA: III  Anesthesia Plan: General   Post-op Pain Management:    Induction: Intravenous  PONV Risk Score and Plan: 1 and Ondansetron, Midazolam and Treatment may vary due to age or medical condition  Airway Management Planned: Oral ETT  Additional Equipment:   Intra-op Plan:   Post-operative Plan: Extubation in OR  Informed Consent: I have reviewed the patients History and Physical, chart, labs and discussed the procedure including the risks, benefits and alternatives for the proposed anesthesia with the patient or authorized representative who has indicated his/her understanding and acceptance.   Dental advisory given and Dental Advisory Given  Plan Discussed with: CRNA, Anesthesiologist and Surgeon  Anesthesia Plan Comments:        Anesthesia Quick Evaluation

## 2017-10-22 ENCOUNTER — Encounter (HOSPITAL_COMMUNITY): Payer: Self-pay | Admitting: Urology

## 2017-10-22 LAB — BASIC METABOLIC PANEL
Anion gap: 7 (ref 5–15)
BUN: 44 mg/dL — AB (ref 6–20)
CALCIUM: 8 mg/dL — AB (ref 8.9–10.3)
CO2: 18 mmol/L — ABNORMAL LOW (ref 22–32)
CREATININE: 5.2 mg/dL — AB (ref 0.61–1.24)
Chloride: 113 mmol/L — ABNORMAL HIGH (ref 98–111)
GFR, EST AFRICAN AMERICAN: 15 mL/min — AB (ref >60)
GFR, EST NON AFRICAN AMERICAN: 13 mL/min — AB (ref >60)
Glucose, Bld: 118 mg/dL — ABNORMAL HIGH (ref 70–99)
Potassium: 3.7 mmol/L (ref 3.5–5.1)
SODIUM: 138 mmol/L (ref 135–145)

## 2017-10-22 LAB — CBC
HCT: 27.5 % — ABNORMAL LOW (ref 39.0–52.0)
HEMOGLOBIN: 8.4 g/dL — AB (ref 13.0–17.0)
MCH: 28.2 pg (ref 26.0–34.0)
MCHC: 30.5 g/dL (ref 30.0–36.0)
MCV: 92.3 fL (ref 78.0–100.0)
Platelets: 524 10*3/uL — ABNORMAL HIGH (ref 150–400)
RBC: 2.98 MIL/uL — AB (ref 4.22–5.81)
RDW: 17 % — ABNORMAL HIGH (ref 11.5–15.5)
WBC: 24.7 10*3/uL — ABNORMAL HIGH (ref 4.0–10.5)

## 2017-10-22 LAB — POCT I-STAT 4, (NA,K, GLUC, HGB,HCT)
GLUCOSE: 191 mg/dL — AB (ref 70–99)
HCT: 23 % — ABNORMAL LOW (ref 39.0–52.0)
HEMOGLOBIN: 7.8 g/dL — AB (ref 13.0–17.0)
Potassium: 3.8 mmol/L (ref 3.5–5.1)
Sodium: 136 mmol/L (ref 135–145)

## 2017-10-22 LAB — GLUCOSE, CAPILLARY
GLUCOSE-CAPILLARY: 176 mg/dL — AB (ref 70–99)
Glucose-Capillary: 146 mg/dL — ABNORMAL HIGH (ref 70–99)
Glucose-Capillary: 149 mg/dL — ABNORMAL HIGH (ref 70–99)
Glucose-Capillary: 170 mg/dL — ABNORMAL HIGH (ref 70–99)
Glucose-Capillary: 182 mg/dL — ABNORMAL HIGH (ref 70–99)
Glucose-Capillary: 91 mg/dL (ref 70–99)

## 2017-10-22 LAB — MAGNESIUM: Magnesium: 1.9 mg/dL (ref 1.7–2.4)

## 2017-10-22 MED ORDER — AMOXICILLIN-POT CLAVULANATE 875-125 MG PO TABS: 1.0000 | ORAL_TABLET | Freq: Two times a day (BID) | ORAL | Status: DC

## 2017-10-22 MED ORDER — MEGESTROL ACETATE 40 MG PO TABS
40.0000 mg | ORAL_TABLET | Freq: Two times a day (BID) | ORAL | Status: DC
  Administered 2017-10-22 – 2017-10-25 (×6): 40 mg via ORAL
  Filled 2017-10-22 (×8): qty 1

## 2017-10-22 MED ORDER — SODIUM CHLORIDE 0.9 % IV SOLN
INTRAVENOUS | Status: AC
  Administered 2017-10-22: 16:00:00 via INTRAVENOUS

## 2017-10-22 MED ORDER — AMOXICILLIN-POT CLAVULANATE 400-57 MG/5ML PO SUSR
400.0000 mg | Freq: Two times a day (BID) | ORAL | Status: DC
  Administered 2017-10-22 (×2): 400 mg via ORAL
  Filled 2017-10-22 (×5): qty 5

## 2017-10-22 MED ORDER — HEPARIN SODIUM (PORCINE) 1000 UNIT/ML IJ SOLN
INTRAMUSCULAR | Status: AC
  Filled 2017-10-22: qty 2

## 2017-10-22 MED ORDER — PROPOFOL 1000 MG/100ML IV EMUL
INTRAVENOUS | Status: AC
  Filled 2017-10-22: qty 100

## 2017-10-22 MED ORDER — HEPARIN SODIUM (PORCINE) 1000 UNIT/ML IJ SOLN
INTRAMUSCULAR | Status: AC
  Filled 2017-10-22: qty 1

## 2017-10-22 MED ORDER — INSULIN GLARGINE 100 UNIT/ML ~~LOC~~ SOLN
13.0000 [IU] | Freq: Every day | SUBCUTANEOUS | Status: DC
  Administered 2017-10-23: 13 [IU] via SUBCUTANEOUS
  Filled 2017-10-22 (×2): qty 0.13

## 2017-10-22 NOTE — Progress Notes (Signed)
PROGRESS NOTE        PATIENT DETAILS Name: Brent Daniels Age: 32 y.o. Sex: male Date of Birth: 03-Mar-1985 Admit Date: 10/16/2017 Admitting Physician Jani Gravel, MD [facility]:QPRF, Costella Hatcher, MD  Brief Narrative: Patient is a 32 y.o. male with prior history of DM-1, gastroparesis, CKD stage V-admitted to [facility] on 9/10 sepsis secondary to scrotal cellulitis, AKI on CKD stage V-patient was started on broad-spectrum IV antimicrobial therapy, IV fluids and other supportive care-with these measures, patient's renal function slowly improved, however he developed Fournier's gangrene, patient was transferred to [facility] on 9/13 and underwent scrotal exploration and debridement on 9/14.  See below for further details.  Subjective:   Patient in bed, appears comfortable, denies any headache, no fever, no chest pain or pressure, no shortness of breath , no abdominal pain. No focal weakness. Mild postop scrotal area discomfort but otherwise he feels OK, poor appetite.  Assessment/Plan:  Sepsis secondary to scrotal cellulitis with progression to Fournier's gangrene: Sepsis pathophysiology has resolved, he is currently on IV vancomycin, wound cultures preliminary growing enterococcus, will stop cefepime and Flagyl and switch to oral Augmentin on 10/22/2017 until cultures are finalized, IR on board status post 2 scrotal washouts on 10/20/2013 and 10/21/2013 respectively, continue wound care and pain control.  Clinically better we will continue to monitor closely.  ARF on CKD stage V: Baseline creatinine close to 4.7, ARF due to sepsis, improving with supportive care.  Continue gentle hydration for another 24 hours and monitor.  Continue Foley catheter for now as he still intermittently soiling his gangreneous scrotal wound.  Hypertension: Blood pressure seems to be reasonably stable with clonidine, amlodipine, will switch IV metoprolol to back to Coreg.  Follow.  Intractable vomiting:  Thought to be due to exacerbation of gastroparesis in the setting of acute meds-resolved with Reglan we will continue to monitor..  Normocytic anemia: Suspect secondary to chronic disease, worsened slightly due to acute illness.  Follow.  Opioid dependence: Continue Suboxone.  Per prior note from Dr. Richrd Sox was last filled on 9/9-1-week supply.  Tobacco abuse: Counseled.  Poor appetite.  Added Megace will monitor.    DM 1: Currently on Lantus twice daily along with sliding scale, monitor and adjust.  CBG (last 3)  Recent Labs    10/21/17 2359 10/22/17 0607 10/22/17 0821  GLUCAP 91 146* 149*   Lab Results  Component Value Date   HGBA1C 8.1 (A) 09/19/2017      DVT Prophylaxis: Prophylactic Heparin   Code Status: Full code   Family Communication: None at bedside  Disposition Plan: Medical bed  Antimicrobial agents: Anti-infectives (From admission, onward)   Start     Dose/Rate Route Frequency Ordered Stop   10/22/17 1045  amoxicillin-clavulanate (AUGMENTIN) 875-125 MG per tablet 1 tablet  Status:  Discontinued     1 tablet Oral Every 12 hours 10/22/17 1039 10/22/17 1040   10/22/17 1045  amoxicillin-clavulanate (AUGMENTIN) 400-57 MG/5ML suspension 400 mg     400 mg Oral Every 12 hours 10/22/17 1040     10/21/17 0802  gentamicin (GARAMYCIN) 80 mg in sodium chloride 0.9 % 500 mL irrigation  Status:  Discontinued       As needed 10/21/17 0820 10/21/17 0849   10/21/17 0645  gentamicin (GARAMYCIN) 80 mg in sodium chloride 0.9 % 500 mL irrigation  Status:  Discontinued  Irrigation To Surgery 10/21/17 0636 10/22/17 0645   10/19/17 2230  ceFEPIme (MAXIPIME) 1 g in sodium chloride 0.9 % 100 mL IVPB  Status:  Discontinued     1 g 200 mL/hr over 30 Minutes Intravenous Every 24 hours 10/19/17 2217 10/22/17 1039   10/19/17 2215  gentamicin (GARAMYCIN) 80 mg in sodium chloride 0.9 % 500 mL irrigation      Irrigation To Surgery 10/19/17 2200 10/19/17 2325   10/19/17 2215   gentamicin (GARAMYCIN) 80 mg in sodium chloride 0.9 % 500 mL irrigation      Irrigation To Surgery 10/19/17 2202 10/19/17 2324   10/19/17 2215  metroNIDAZOLE (FLAGYL) IVPB 500 mg  Status:  Discontinued     500 mg 100 mL/hr over 60 Minutes Intravenous Every 8 hours 10/19/17 2216 10/22/17 1039   10/18/17 1200  vancomycin (VANCOCIN) IVPB 1000 mg/200 mL premix     1,000 mg 200 mL/hr over 60 Minutes Intravenous Every 48 hours 10/16/17 1438     10/18/17 1000  doxycycline (VIBRA-TABS) tablet 100 mg  Status:  Discontinued     100 mg Oral Every 12 hours 10/18/17 0933 10/20/17 1043   10/17/17 0900  doxycycline (VIBRAMYCIN) 100 mg in sodium chloride 0.9 % 250 mL IVPB  Status:  Discontinued     100 mg 125 mL/hr over 120 Minutes Intravenous Every 12 hours 10/17/17 0749 10/18/17 0933   10/16/17 2000  ceFEPIme (MAXIPIME) 2 g in sodium chloride 0.9 % 100 mL IVPB     2 g 200 mL/hr over 30 Minutes Intravenous  Once 10/16/17 1937 10/16/17 2200   10/16/17 1500  vancomycin (VANCOCIN) IVPB 1000 mg/200 mL premix     1,000 mg 200 mL/hr over 60 Minutes Intravenous  Once 10/16/17 1356 10/16/17 1654   10/16/17 1245  vancomycin (VANCOCIN) IVPB 1000 mg/200 mL premix     1,000 mg 200 mL/hr over 60 Minutes Intravenous  Once 10/16/17 1239 10/16/17 1457   10/16/17 1245  cefTRIAXone (ROCEPHIN) 2 g in sodium chloride 0.9 % 100 mL IVPB  Status:  Discontinued     2 g 200 mL/hr over 30 Minutes Intravenous Every 24 hours 10/16/17 1239 10/19/17 2222      Procedures: 9/14>> Scrotal exploration +washout, Debridement of devitalized tissue  9/15 - 2nd washout  CONSULTS:  urology  Time spent: 35 minutes-Greater than 50% of this time was spent in counseling, explanation of diagnosis, planning of further management, and coordination of care.  MEDICATIONS: Scheduled Meds: . amLODipine  10 mg Oral Daily  . amoxicillin-clavulanate  400 mg Oral Q12H  . buprenorphine-naloxone  1 tablet Sublingual BID  . carvedilol  25 mg  Oral BID WC  . Chlorhexidine Gluconate Cloth  6 each Topical Daily  . cloNIDine  0.3 mg Transdermal Weekly  . heparin  5,000 Units Subcutaneous Q8H  . insulin aspart  0-5 Units Subcutaneous QHS  . insulin aspart  0-9 Units Subcutaneous TID WC  . insulin aspart  3 Units Subcutaneous TID WC  . insulin glargine  13 Units Subcutaneous Daily  . megestrol  40 mg Oral BID  . metoCLOPramide (REGLAN) injection  5 mg Intravenous Q8H  . mupirocin ointment  1 application Nasal BID  . nystatin ointment   Topical BID  . pantoprazole  40 mg Oral Daily   Continuous Infusions: . sodium chloride    . sodium chloride    . vancomycin 1,000 mg (10/20/17 1327)   PRN Meds:.sodium chloride, haloperidol lactate, hydrALAZINE, morphine injection, ondansetron (ZOFRAN) IV,  oxyCODONE, promethazine, tiZANidine   PHYSICAL EXAM: Vital signs: Vitals:   10/22/17 0207 10/22/17 0500 10/22/17 0543 10/22/17 0951  BP: 129/78  (!) 143/81 (!) 148/77  Pulse:    (!) 102  Resp:   18   Temp:   98.4 F (36.9 C)   TempSrc:   Oral   SpO2:   96% 96%  Weight:  91.4 kg    Height:       Filed Weights   10/19/17 0500 10/19/17 2058 10/22/17 0500  Weight: 88.9 kg 88.5 kg 91.4 kg   Body mass index is 25.59 kg/m.   Exam  Awake Alert, Oriented X 3, No new F.N deficits, Normal affect [place].AT,PERRAL Supple Neck,No JVD, No cervical lymphadenopathy appriciated.  Symmetrical Chest wall movement, Good air movement bilaterally, CTAB RRR,No Gallops, Rubs or new Murmurs, No Parasternal Heave +ve B.Sounds, Abd Soft, No tenderness, No organomegaly appriciated, No rebound - guarding or rigidity. No Cyanosis, Clubbing or edema, No new Rash or bruise Scrotal area under bandage-Foley in place.  I have personally reviewed following labs and imaging studies  LABORATORY DATA: CBC: Recent Labs  Lab 10/16/17 1250  10/19/17 0424 10/19/17 2234 10/20/17 0253 10/20/17 1154 10/21/17 1004 10/22/17 0750  WBC 28.5*   < > 27.5*  --  28.9*  28.0* 19.5* 24.7*  NEUTROABS 25.4*  --   --   --   --   --   --   --   HGB 8.5*   < > 7.5* 7.8* 9.1* 9.0* 8.8* 8.4*  HCT 25.8*   < > 22.8* 23.0* 28.4* 28.8* 28.3* 27.5*  MCV 89.9   < > 89.1  --  91.3 91.1 92.5 92.3  PLT 350   < > 396  --  399 410* 458* 524*   < > = values in this interval not displayed.    Basic Metabolic Panel: Recent Labs  Lab 10/19/17 0424 10/19/17 2234 10/20/17 0253 10/20/17 1154 10/21/17 1004 10/22/17 0416 10/22/17 0750  NA 132* 136 135 136 136 138  --   K 3.8 3.8 4.2 4.1 3.5 3.7  --   CL 98  --  103 102 107 113*  --   CO2 22  --  19* 24 18* 18*  --   GLUCOSE 325* 191* 276* 196* 97 118*  --   BUN 57*  --  56* 53* 49* 44*  --   CREATININE 6.42*  6.40*  --  6.11* 5.70* 5.39* 5.20*  --   CALCIUM 8.5*  --  8.5* 8.7* 8.2* 8.0*  --   MG  --   --   --   --   --   --  1.9  PHOS 2.7  --   --   --   --   --   --     GFR: Estimated Creatinine Clearance: 23.5 mL/min (A) (by C-G formula based on SCr of 5.2 mg/dL (H)).  Liver Function Tests: Recent Labs  Lab 10/16/17 1250 10/19/17 0424  AST 18  --   ALT 7  --   ALKPHOS 88  --   BILITOT 0.8  --   PROT 7.8  --   ALBUMIN 3.7 2.4*   No results for input(s): LIPASE, AMYLASE in the last 168 hours. No results for input(s): AMMONIA in the last 168 hours.  Coagulation Profile: No results for input(s): INR, PROTIME in the last 168 hours.  Cardiac Enzymes: No results for input(s): CKTOTAL, CKMB, CKMBINDEX, TROPONINI in the last 168  hours.  BNP (last 3 results) No results for input(s): PROBNP in the last 8760 hours.  HbA1C: No results for input(s): HGBA1C in the last 72 hours.  CBG: Recent Labs  Lab 10/21/17 2219 10/21/17 2316 10/21/17 2359 10/22/17 0607 10/22/17 0821  GLUCAP 68* 67* 91 146* 149*    Lipid Profile: No results for input(s): CHOL, HDL, LDLCALC, TRIG, CHOLHDL, LDLDIRECT in the last 72 hours.  Thyroid Function Tests: No results for input(s): TSH, T4TOTAL, FREET4, T3FREE, THYROIDAB  in the last 72 hours.  Anemia Panel: No results for input(s): VITAMINB12, FOLATE, FERRITIN, TIBC, IRON, RETICCTPCT in the last 72 hours.  Urine analysis:    Component Value Date/Time   COLORURINE YELLOW 10/17/2017 0125   APPEARANCEUR HAZY (A) 10/17/2017 0125   LABSPEC 1.014 10/17/2017 0125   PHURINE 5.0 10/17/2017 0125   GLUCOSEU >=500 (A) 10/17/2017 0125   HGBUR MODERATE (A) 10/17/2017 0125   BILIRUBINUR NEGATIVE 10/17/2017 0125   KETONESUR 20 (A) 10/17/2017 0125   PROTEINUR 100 (A) 10/17/2017 0125   UROBILINOGEN 1.0 11/13/2014 0155   NITRITE NEGATIVE 10/17/2017 0125   LEUKOCYTESUR NEGATIVE 10/17/2017 0125    Sepsis Labs: Lactic Acid, Venous    Component Value Date/Time   LATICACIDVEN 1.58 10/16/2017 1318    MICROBIOLOGY: Recent Results (from the past 240 hour(s))  Blood Culture (routine x 2)     Status: None   Collection Time: 10/16/17 12:50 PM  Result Value Ref Range Status   Specimen Description BLOOD LEFT ANTECUBITAL  Final   Special Requests   Final    BOTTLES DRAWN AEROBIC AND ANAEROBIC Blood Culture results may not be optimal due to an inadequate volume of blood received in culture bottles   Culture   Final    NO GROWTH 5 DAYS Performed at [facility], [address], [place], [place] [address]    Report Status 10/21/2017 FINAL  Final  Blood Culture (routine x 2)     Status: None   Collection Time: 10/16/17  9:07 PM  Result Value Ref Range Status   Specimen Description LEFT ANTECUBITAL  Final   Special Requests   Final    BOTTLES DRAWN AEROBIC AND ANAEROBIC Blood Culture adequate volume   Culture   Final    NO GROWTH 5 DAYS Performed at [facility], [address] [place], [place] [address]    Report Status 10/21/2017 FINAL  Final  MRSA PCR Screening     Status: None   Collection Time: 10/17/17  4:37 AM  Result Value Ref Range Status   MRSA by PCR NEGATIVE NEGATIVE Final    Comment:        The GeneXpert MRSA Assay (FDA approved for NASAL  specimens only), is one component of a comprehensive MRSA colonization surveillance program. It is not intended to diagnose MRSA infection nor to guide or monitor treatment for MRSA infections. Performed at [facility], [address] [place], [place] [address]   Culture, blood (routine x 2)     Status: None (Preliminary result)   Collection Time: 10/18/17  6:05 PM  Result Value Ref Range Status   Specimen Description BLOOD LEFT HAND  Final   Special Requests   Final    BOTTLES DRAWN AEROBIC AND ANAEROBIC Blood Culture adequate volume   Culture   Final    NO GROWTH 4 DAYS Performed at [facility], [address], [place], [place] [address]    Report Status PENDING  Incomplete  Culture, blood (routine x 2)  Status: None (Preliminary result)   Collection Time: 10/18/17  6:09 PM  Result Value Ref Range Status   Specimen Description BLOOD LEFT HAND  Final   Special Requests Blood Culture adequate volume  Final   Culture   Final    NO GROWTH 4 DAYS Performed at [facility], [address], [place], [place] [address]    Report Status PENDING  Incomplete  Aerobic/Anaerobic Culture (surgical/deep wound)     Status: None (Preliminary result)   Collection Time: 10/19/17 11:27 PM  Result Value Ref Range Status   Specimen Description ABSCESS SCROTUM  Final   Special Requests PATIENT ON FOLLOWING VANC DOXYCYCLINE CEFEPIME  Final   Gram Stain   Final    ABUNDANT WBC PRESENT, PREDOMINANTLY PMN ABUNDANT GRAM NEGATIVE RODS ABUNDANT GRAM POSITIVE COCCI    Culture   Final    MODERATE ENTEROCOCCUS AVIUM HOLDING FOR POSSIBLE ANAEROBE Performed at [facility], [place] [address], [place], [place] [address]    Report Status PENDING  Incomplete   Organism ID, Bacteria ENTEROCOCCUS AVIUM  Final      Susceptibility   Enterococcus avium - MIC*    AMPICILLIN <=2 SENSITIVE Sensitive     VANCOMYCIN <=0.5 SENSITIVE Sensitive     GENTAMICIN SYNERGY SENSITIVE Sensitive     * MODERATE  ENTEROCOCCUS AVIUM  Surgical pcr screen     Status: Abnormal   Collection Time: 10/20/17 10:21 PM  Result Value Ref Range Status   MRSA, PCR NEGATIVE NEGATIVE Final   Staphylococcus aureus POSITIVE (A) NEGATIVE Final    Comment: RESULT CALLED TO, READ BACK BY AND VERIFIED WITH: RN DYolanda Manges [phone] @0151  THANEY Performed at [facility] [address] [place],  [address]     RADIOLOGY STUDIES/RESULTS: Ct Abdomen Pelvis Wo Contrast  Result Date: 10/16/2017 CLINICAL DATA:  Four-day history of pain, erythema and swelling involving the RIGHT hemiscrotum, associated with fever. Current history of end-stage renal disease for which the patient had a an AV fistula placed on 04/09/2017. Surgical history includes cholecystectomy. EXAM: CT ABDOMEN AND PELVIS WITHOUT CONTRAST TECHNIQUE: Multidetector CT imaging of the abdomen and pelvis was performed following the standard protocol without IV contrast. COMPARISON:  07/10/2017, 05/29/2017 and earlier. FINDINGS: Lower chest: Minimal linear scar/atelectasis in the lingula and both lower lobes. Visualized lung bases otherwise clear. Resolution of RIGHT LOWER LOBE pneumonia since 07/10/2017. Heart mildly enlarged with RIGHT coronary artery atherosclerosis. Hepatobiliary: Normal unenhanced appearance of the liver. Surgically absent gallbladder. No unexpected biliary ductal dilation. Pancreas: Normal unenhanced appearance. Spleen: Normal unenhanced appearance. Adrenals/Urinary Tract: Normal appearing adrenal glands. No evidence of urinary tract calculi or obstruction on either side. Within the limits of the unenhanced technique, no focal parenchymal abnormality involving either kidney. Normal appearing urinary bladder. Stomach/Bowel: Thickening of the wall of the distal esophagus to the level of the EG junction as noted previously. Normal appearing decompressed stomach. Normal-appearing small bowel. Normal-appearing colon with expected stool burden.  Normal appendix in the RIGHT UPPER pelvis. Vascular/Lymphatic: Moderate aorto-iliofemoral atherosclerosis without evidence of aneurysm. Enlarged LEFT periaortic retroperitoneal lymph nodes, the largest node measuring approximately 2.3 x 2.1 cm (series 2, image 34), increased in size since the most recent prior CT 07/10/2017 where it measured 1.8 x 1.7 cm at the same level. Multiple upper normal size to slightly enlarged BILATERAL superficial inguinal lymph nodes, increased in size since the most recent prior CT. Reproductive: Prostate gland and seminal vesicles normal in size and appearance for age. Extensive edema/inflammation  involving the RIGHT hemiscrotum with marked skin thickening and a likely fluid collection within the thickened skin measuring approximately 1.6 cm (series 3, image 18). Mild edema/inflammation involving the contralateral LEFT hemiscrotum. Large BILATERAL hydroceles. No evidence of gas within the scrotal tissues. Other: None. Musculoskeletal: Large Schmorl's node in the UPPER endplate of J82 with mild compression deformity of the UPPER endplate of N05, unchanged. No new/acute findings. IMPRESSION: 1. Edema/inflammation involving the RIGHT hemiscrotum with marked skin thickening and a likely abscess within the thickened skin. Edema/inflammation to a lesser degree involves the contralateral LEFT hemiscrotum. Large BILATERAL hydroceles are present. No evidence of gas within the scrotal tissues to suggest Fournier's gangrene. Scrotal ultrasound is recommended in further evaluation to better delineate these findings. 2. Enlarging LEFT periaortic lymph nodes and BILATERAL superficial inguinal lymph nodes since the most recent prior CT 3 months ago. While these are statistically likely reactive, a low-grade lymphoproliferative disorder could have a similar appearance. 3. Stable marked thickening of the wall of the visualized distal esophagus to the level of the EG junction dating back to at least  2017, likely chronic esophagitis. 4.  Aortic Atherosclerosis, advanced for patient age.  (ICD10-170.0) Electronically Signed   By: Evangeline Dakin M.D.   On: 10/16/2017 15:10   US Scrotum W/doppler  Result Date: 10/16/2017 CLINICAL DATA:  Right-sided pain with edema and redness over the last 4 days EXAM: SCROTAL ULTRASOUND DOPPLER ULTRASOUND OF THE TESTICLES TECHNIQUE: Complete ultrasound examination of the testicles, epididymis, and other scrotal structures was performed. Color and spectral Doppler ultrasound were also utilized to evaluate blood flow to the testicles. COMPARISON:  CT abdomen pelvis of 10/16/2016 FINDINGS: Right testicle Measurements: The right testicle measures 3.9 x 2.4 x 2.6 cm. No intratesticular abnormality is seen. There is blood flow to the right testicle with arterial and venous waveforms. Left testicle Measurements: The left testicle measures 4.2 x 2.7 x 2.6 cm. No intratesticular abnormality is seen. Blood flow is demonstrated to the left testicle with arterial and venous waveforms. Right epididymis:  The right epididymis is unremarkable. Left epididymis: The left epididymis appears enlarged and inhomogeneous as well as hypervascular suggesting left epididymitis. Hydrocele: There is a small right hydrocele present which appears partially septated. Varicocele:  No varicocele is seen. Pulsed Doppler interrogation of both testes demonstrates normal low resistance arterial and venous waveforms bilaterally. There is diffuse scrotal edema and skin thickening present. No discrete abscess is detected. IMPRESSION: 1. Diffusely edematous scrotal wall.  No abscess. 2. Irregular and enlarged left epididymis with hypervascularity suggestive of left epididymitis. 3. No intratesticular abnormality is seen. Blood flow is demonstrated to both testicles with arterial and venous waveforms. 4. Small right hydrocele which appears septated. Electronically Signed   By: Ivar Drape M.D.   On: 10/16/2017  16:22     LOS: 6 days   Lala Lund, MD  [facility]  If 7PM-7AM, please contact night-coverage  Please page via [email or web address]-click on MD name and type text message  10/22/2017, 10:41 AM

## 2017-10-22 NOTE — Consult Note (Signed)
Urology Progress Note   S/p scrotal exploration, debridement and washout 10/20/17  S/p 2nd look washout and primary closure 10/21/17  Subjective: NAEON. Vitals remained stable, afebrile A touch more lucid than previous days although still drowsy WBC downtrending to 18 yesterday, creatinine improvingly slowly to 5.2 Still complains of moderate to severe pain in scrotum  Objective: Vital signs in last 24 hours: Temp:  [97.7 F (36.5 C)-99.5 F (37.5 C)] 98.4 F (36.9 C) (09/16 0543) Pulse Rate:  [77-95] 95 (09/15 2207) Resp:  [11-18] 18 (09/16 0543) BP: (122-150)/(73-86) 143/81 (09/16 0543) SpO2:  [93 %-99 %] 96 % (09/16 0543) Weight:  [91.4 kg] 91.4 kg (09/16 0500)  Intake/Output from previous day: 09/15 0701 - 09/16 0700 In: 2672.4 [P.O.:290; I.V.:2082.4; IV Piggyback:200] Out: 2245 [Urine:2095; Blood:150] Intake/Output this shift: Total I/O In: 634.6 [I.V.:634.6] Out: 800 [Urine:800]  Physical Exam:  General: alert if redirected but drowsy, able to answer questions appropriately intermittently CV: RRR Lungs: Clear Abdomen: Soft, appropriately tender.  GU: Foley in place draining clear yellow urine. Scrotal wound loosely closed with nylon stitches. Two penrose drains in scrotum bilaterally. Left hemiscrotum is fairly tense and warm to touch, tender. May be some post op swelling due or due to tension on closure although needs to be monitored closely. Wound edges appear okay, some duskiness that will also need to be watched Ext: NT, No erythema  Lab Results: Recent Labs    10/20/17 0253 10/20/17 1154 10/21/17 1004  HGB 9.1* 9.0* 8.8*  HCT 28.4* 28.8* 28.3*   BMET Recent Labs    10/21/17 1004 10/22/17 0416  NA 136 138  K 3.5 3.7  CL 107 113*  CO2 18* 18*  GLUCOSE 97 118*  BUN 49* 44*  CREATININE 5.39* 5.20*  CALCIUM 8.2* 8.0*     Studies/Results: No results found.  Assessment/Plan:  32 y.o. male w/ uncontrolled T1DM, developed Fournier's gangrene. S/p  initial debridemennt on 10/20/17. S/p 2nd look washout and loose primary closure with drains on 10/21/17  - Urology will continue to follow along with daily exams. Will plan to slowly back drains out in stepwise approach if exam improving over the course of this week - Nursing to continue gauze dressing changes q8h and PRN - penrose drains to stay in place for now - Okay to remove foley catheter from our standpoint - agree with triple coverage abx - follow up wound cultures   LOS: 6 days   Fredricka Bonine 10/22/2017, 6:32 AM

## 2017-10-22 NOTE — Progress Notes (Signed)
Pt demanded foley to be remove, DO notify, pt advised the risk and then d/c foley.

## 2017-10-22 NOTE — Progress Notes (Signed)
Pharmacy Antibiotic Note  Brent Daniels is a 32 y.o. male admitted on 10/16/2017 with infection.  Pharmacy has been consulted for Vancomycin dosing.  On abx for polymicrobial fourniers gangrene. Afebrile, WBC up to 24.7. Cx showing enterococcus avium, GNRs, and holding for anaerobes currently.  Plan: Stop cefepime and Flagyl Continue vancomycin 1g IV Q48h (tentative stop date of 9/19 per MD) Start Augmentin 400-57mg  PO BID Monitor clinical picture, renal function, VT prn F/U C&S, abx deescalation / LOT  Height: 6' 2.4" (189 cm) Weight: 201 lb 8 oz (91.4 kg) IBW/kg (Calculated) : 83.12  Temp (24hrs), Avg:98.7 F (37.1 C), Min:98.1 F (36.7 C), Max:99.5 F (37.5 C)  Recent Labs  Lab 10/16/17 1318  10/19/17 0424 10/20/17 0253 10/20/17 1154 10/21/17 1004 10/22/17 0416 10/22/17 0750  WBC  --    < > 27.5* 28.9* 28.0* 19.5*  --  24.7*  CREATININE  --    < > 6.42*  6.40* 6.11* 5.70* 5.39* 5.20*  --   LATICACIDVEN 1.58  --   --   --   --   --   --   --    < > = values in this interval not displayed.    Estimated Creatinine Clearance: 23.5 mL/min (A) (by C-G formula based on SCr of 5.2 mg/dL (H)).    Allergies  Allergen Reactions  . Nsaids Nausea And Vomiting  . Tramadol Anaphylaxis, Other (See Comments) and Nausea Only    Acid Reflux Acid Reflux  . Ibuprofen Other (See Comments) and Nausea And Vomiting    Stomach ulcers  . Naproxen Other (See Comments)    Stomach ulcers Stomach ulcers  . Sulfa Antibiotics Rash and Other (See Comments)    Stomach ulcers   . Hydrocodone Bitartrate     Anaphylaxis Can take plain Tylenol  . Ketorolac Other (See Comments)    Unknown   Elenor Quinones, PharmD, BCPS Clinical Pharmacist Phone number [phone] 10/22/2017 11:14 AM

## 2017-10-22 NOTE — Progress Notes (Signed)
Inpatient Diabetes Program Recommendations  AACE/ADA: New Consensus Statement on Inpatient Glycemic Control (2015)  Target Ranges:  Prepandial:   less than 140 mg/dL      Peak postprandial:   less than 180 mg/dL (1-2 hours)      Critically ill patients:  140 - 180 mg/dL   Results for CHASYN, CINQUE (MRN [id number]) as of 10/22/2017 10:57  Ref. Range 10/21/2017 08:54 10/21/2017 12:32 10/21/2017 17:14 10/21/2017 22:19 10/21/2017 23:16 10/21/2017 23:59 10/22/2017 06:07 10/22/2017 08:21  Glucose-Capillary Latest Ref Range: 70 - 99 mg/dL 97 110 (H) 78 68 (L) 67 (L) 91 146 (H) 149 (H)   Review of Glycemic Control  Diabetes history: DM 1 Outpatient Diabetes medications: Basaglar 6 units BID Current orders for Inpatient glycemic control:   Inpatient Diabetes Program Recommendations:  Patient on Lantus 13 units.   Due to renal function consider decreasing basal insulin to home dose of 12 units and adjust to a custom Novolog Correction scale that starts at 150 mg/dl.  -Custom Novolog correction scale 0-5 units       151-200  1 unit      201-250  2 units      251-300  3 units      301-350  4 units      351-400  5 units  Thanks,  Tama Headings RN, MSN, BC-ADM Inpatient Diabetes Coordinator Team Pager [phone] (8a-5p)

## 2017-10-22 NOTE — Progress Notes (Signed)
Patient's CBG=68, came up to 91 mg/dL after treating with regular ginger ale per patient 's preference. Held 13 units of Lantus  Scheduled tonight.  Notified Blount NP without any new order. Patient was asymptomatic for hypoglycemic episode. Will continue to monitor.

## 2017-10-23 DIAGNOSIS — N181 Chronic kidney disease, stage 1: Secondary | ICD-10-CM

## 2017-10-23 DIAGNOSIS — K3184 Gastroparesis: Secondary | ICD-10-CM

## 2017-10-23 DIAGNOSIS — N451 Epididymitis: Secondary | ICD-10-CM

## 2017-10-23 DIAGNOSIS — E1043 Type 1 diabetes mellitus with diabetic autonomic (poly)neuropathy: Secondary | ICD-10-CM

## 2017-10-23 LAB — BPAM RBC
BLOOD PRODUCT EXPIRATION DATE: 201909242359
BLOOD PRODUCT EXPIRATION DATE: 201909242359
BLOOD PRODUCT EXPIRATION DATE: 201910072359
Blood Product Expiration Date: 201910012359
ISSUE DATE / TIME: 201909132314
ISSUE DATE / TIME: 201909061114
ISSUE DATE / TIME: 201909132314
UNIT TYPE AND RH: 6200
Unit Type and Rh: 6200
Unit Type and Rh: 6200
Unit Type and Rh: 6200

## 2017-10-23 LAB — BASIC METABOLIC PANEL
ANION GAP: 18 — AB (ref 5–15)
BUN: 45 mg/dL — ABNORMAL HIGH (ref 6–20)
CALCIUM: 8.7 mg/dL — AB (ref 8.9–10.3)
CO2: 13 mmol/L — AB (ref 22–32)
Chloride: 104 mmol/L (ref 98–111)
Creatinine, Ser: 5.31 mg/dL — ABNORMAL HIGH (ref 0.61–1.24)
GFR calc Af Amer: 15 mL/min — ABNORMAL LOW (ref >60)
GFR calc non Af Amer: 13 mL/min — ABNORMAL LOW (ref >60)
Glucose, Bld: 341 mg/dL — ABNORMAL HIGH (ref 70–99)
Potassium: 4.2 mmol/L (ref 3.5–5.1)
Sodium: 135 mmol/L (ref 135–145)

## 2017-10-23 LAB — TYPE AND SCREEN
ABO/RH(D): A POS
Antibody Screen: NEGATIVE
UNIT DIVISION: 0
UNIT DIVISION: 0
Unit division: 0
Unit division: 0

## 2017-10-23 LAB — AEROBIC/ANAEROBIC CULTURE W GRAM STAIN (SURGICAL/DEEP WOUND)

## 2017-10-23 LAB — CBC
HCT: 26.9 % — ABNORMAL LOW (ref 39.0–52.0)
HEMOGLOBIN: 8.1 g/dL — AB (ref 13.0–17.0)
MCH: 28.8 pg (ref 26.0–34.0)
MCHC: 30.1 g/dL (ref 30.0–36.0)
MCV: 95.7 fL (ref 78.0–100.0)
Platelets: 566 10*3/uL — ABNORMAL HIGH (ref 150–400)
RBC: 2.81 MIL/uL — AB (ref 4.22–5.81)
RDW: 16.5 % — ABNORMAL HIGH (ref 11.5–15.5)
WBC: 21.8 10*3/uL — AB (ref 4.0–10.5)

## 2017-10-23 LAB — CULTURE, BLOOD (ROUTINE X 2)
CULTURE: NO GROWTH
CULTURE: NO GROWTH
Special Requests: ADEQUATE
Special Requests: ADEQUATE

## 2017-10-23 LAB — GLUCOSE, CAPILLARY
GLUCOSE-CAPILLARY: 337 mg/dL — AB (ref 70–99)
GLUCOSE-CAPILLARY: 339 mg/dL — AB (ref 70–99)
GLUCOSE-CAPILLARY: 197 mg/dL — AB (ref 70–99)
Glucose-Capillary: 153 mg/dL — ABNORMAL HIGH (ref 70–99)
Glucose-Capillary: 225 mg/dL — ABNORMAL HIGH (ref 70–99)

## 2017-10-23 LAB — AEROBIC/ANAEROBIC CULTURE (SURGICAL/DEEP WOUND)

## 2017-10-23 LAB — MAGNESIUM: MAGNESIUM: 1.8 mg/dL (ref 1.7–2.4)

## 2017-10-23 MED ORDER — PRO-STAT SUGAR FREE PO LIQD
30.0000 mL | Freq: Three times a day (TID) | ORAL | Status: DC
  Filled 2017-10-23 (×4): qty 30

## 2017-10-23 MED ORDER — DRONABINOL 2.5 MG PO CAPS
5.0000 mg | ORAL_CAPSULE | Freq: Two times a day (BID) | ORAL | Status: DC
  Administered 2017-10-23 – 2017-10-27 (×7): 5 mg via ORAL
  Filled 2017-10-23 (×10): qty 2

## 2017-10-23 MED ORDER — INSULIN GLARGINE 100 UNIT/ML ~~LOC~~ SOLN
15.0000 [IU] | Freq: Two times a day (BID) | SUBCUTANEOUS | Status: DC
  Administered 2017-10-23 – 2017-10-24 (×2): 15 [IU] via SUBCUTANEOUS
  Filled 2017-10-23 (×2): qty 0.15

## 2017-10-23 MED ORDER — SODIUM CHLORIDE 0.9 % IV SOLN
INTRAVENOUS | Status: AC
  Administered 2017-10-23 – 2017-10-25 (×2): via INTRAVENOUS

## 2017-10-23 MED ORDER — AMOXICILLIN-POT CLAVULANATE 500-125 MG PO TABS
1.0000 | ORAL_TABLET | Freq: Two times a day (BID) | ORAL | Status: DC
  Administered 2017-10-23 – 2017-10-28 (×10): 500 mg via ORAL
  Filled 2017-10-23 (×14): qty 1

## 2017-10-23 MED ORDER — INSULIN ASPART 100 UNIT/ML ~~LOC~~ SOLN
6.0000 [IU] | Freq: Three times a day (TID) | SUBCUTANEOUS | Status: DC
  Administered 2017-10-23 – 2017-10-25 (×5): 6 [IU] via SUBCUTANEOUS

## 2017-10-23 MED ORDER — INSULIN PEN NEEDLE 31G X 8 MM MISC: 0 refills | Status: DC

## 2017-10-23 NOTE — Progress Notes (Signed)
PROGRESS NOTE        PATIENT DETAILS Name: Brent Daniels Age: 32 y.o. Sex: male Date of Birth: 1985/08/03 Admit Date: 10/16/2017 Admitting Physician Jani Gravel, MD LKG:MWNU, Costella Hatcher, MD  Brief Narrative: Patient is a 32 y.o. male with prior history of DM-1, gastroparesis, CKD stage V-admitted to [facility] on 9/10 sepsis secondary to scrotal cellulitis, AKI on CKD stage V-patient was started on broad-spectrum IV antimicrobial therapy, IV fluids and other supportive care-with these measures, patient's renal function slowly improved, however he developed Fournier's gangrene, patient was transferred to [facility] on 9/13 and underwent scrotal exploration and debridement on 9/14.  See below for further details.  Subjective:   Patient in bed, appears comfortable, denies any headache, no fever, no chest pain or pressure, no shortness of breath , no abdominal pain. No focal weakness, poor appetite.  Assessment/Plan:  Sepsis secondary to scrotal cellulitis with progression to Fournier's gangrene: Sepsis pathophysiology has resolved, he was treated empirically with IV vancomycin wound cultures seem to be growing pansensitive enterococcus, he was switched to oral Augmentin on 10/23/2017 after discussions with ID Dr. Johnnye Sima, urology is on board and he is status post 2 scrotal washouts on 10/20/2013 and 10/21/2013 respectively, he had a retained Penrose drain in his scrotum but unfortunately patient pulled it out on 10/22/2017, may require another washout.  Continue to monitor closely.  May require placement as well.  ARF on CKD stage V: Baseline creatinine close to 4.7, ARF due to sepsis, improving with supportive care.  Continue gentle hydration for another 24 hours and monitor.  Hypertension: Blood pressure seems to be reasonably stable with clonidine, amlodipine, will switch IV metoprolol to back to Coreg.  Follow.  Intractable vomiting: Thought to be due to exacerbation of  gastroparesis in the setting of acute meds-resolved with Reglan we will continue to monitor..  Normocytic anemia: Suspect secondary to chronic disease, worsened slightly due to acute illness.  Follow.  Opioid dependence: Continue Suboxone.  Per prior note from Dr. Richrd Sox was last filled on 9/9-1-week supply.  Tobacco abuse: Counseled.  Poor appetite.  Added Megace along with Marinol and prostat.    DM 1: Blood sugars have been labile as his oral intake is pretty erratic, adjusted Lantus added pre-meal NovoLog, continue sliding scale, monitor and adjust.  CBG (last 3)  Recent Labs    10/22/17 2217 10/23/17 0813 10/23/17 1158  GLUCAP 170* 337* 339*   Lab Results  Component Value Date   HGBA1C 8.1 (A) 09/19/2017      DVT Prophylaxis: Prophylactic Heparin   Code Status: Full code   Family Communication: None at bedside  Disposition Plan: Medical bed  Antimicrobial agents: Anti-infectives (From admission, onward)   Start     Dose/Rate Route Frequency Ordered Stop   10/23/17 1000  amoxicillin-clavulanate (AUGMENTIN) 500-125 MG per tablet 500 mg     1 tablet Oral 2 times daily 10/23/17 0917     10/22/17 1130  amoxicillin-clavulanate (AUGMENTIN) 400-57 MG/5ML suspension 400 mg  Status:  Discontinued     400 mg Oral Every 12 hours 10/22/17 1040 10/23/17 0917   10/22/17 1045  amoxicillin-clavulanate (AUGMENTIN) 875-125 MG per tablet 1 tablet  Status:  Discontinued     1 tablet Oral Every 12 hours 10/22/17 1039 10/22/17 1040   10/21/17 0802  gentamicin (GARAMYCIN) 80 mg in sodium chloride  0.9 % 500 mL irrigation  Status:  Discontinued       As needed 10/21/17 0820 10/21/17 0849   10/21/17 0645  gentamicin (GARAMYCIN) 80 mg in sodium chloride 0.9 % 500 mL irrigation  Status:  Discontinued      Irrigation To Surgery 10/21/17 0636 10/22/17 0645   10/19/17 2230  ceFEPIme (MAXIPIME) 1 g in sodium chloride 0.9 % 100 mL IVPB  Status:  Discontinued     1 g 200 mL/hr over 30  Minutes Intravenous Every 24 hours 10/19/17 2217 10/22/17 1039   10/19/17 2215  gentamicin (GARAMYCIN) 80 mg in sodium chloride 0.9 % 500 mL irrigation      Irrigation To Surgery 10/19/17 2200 10/19/17 2325   10/19/17 2215  gentamicin (GARAMYCIN) 80 mg in sodium chloride 0.9 % 500 mL irrigation      Irrigation To Surgery 10/19/17 2202 10/19/17 2324   10/19/17 2215  metroNIDAZOLE (FLAGYL) IVPB 500 mg  Status:  Discontinued     500 mg 100 mL/hr over 60 Minutes Intravenous Every 8 hours 10/19/17 2216 10/22/17 1039   10/18/17 1200  vancomycin (VANCOCIN) IVPB 1000 mg/200 mL premix  Status:  Discontinued     1,000 mg 200 mL/hr over 60 Minutes Intravenous Every 48 hours 10/16/17 1438 10/23/17 0917   10/18/17 1000  doxycycline (VIBRA-TABS) tablet 100 mg  Status:  Discontinued     100 mg Oral Every 12 hours 10/18/17 0933 10/20/17 1043   10/17/17 0900  doxycycline (VIBRAMYCIN) 100 mg in sodium chloride 0.9 % 250 mL IVPB  Status:  Discontinued     100 mg 125 mL/hr over 120 Minutes Intravenous Every 12 hours 10/17/17 0749 10/18/17 0933   10/16/17 2000  ceFEPIme (MAXIPIME) 2 g in sodium chloride 0.9 % 100 mL IVPB     2 g 200 mL/hr over 30 Minutes Intravenous  Once 10/16/17 1937 10/16/17 2200   10/16/17 1500  vancomycin (VANCOCIN) IVPB 1000 mg/200 mL premix     1,000 mg 200 mL/hr over 60 Minutes Intravenous  Once 10/16/17 1356 10/16/17 1654   10/16/17 1245  vancomycin (VANCOCIN) IVPB 1000 mg/200 mL premix     1,000 mg 200 mL/hr over 60 Minutes Intravenous  Once 10/16/17 1239 10/16/17 1457   10/16/17 1245  cefTRIAXone (ROCEPHIN) 2 g in sodium chloride 0.9 % 100 mL IVPB  Status:  Discontinued     2 g 200 mL/hr over 30 Minutes Intravenous Every 24 hours 10/16/17 1239 10/19/17 2222      Procedures: 9/14>> Scrotal exploration +washout, Debridement of devitalized tissue  9/15 - 2nd washout  CONSULTS:  urology  Time spent: 35 minutes-Greater than 50% of this time was spent in counseling,  explanation of diagnosis, planning of further management, and coordination of care.  MEDICATIONS: Scheduled Meds: . amLODipine  10 mg Oral Daily  . amoxicillin-clavulanate  1 tablet Oral BID  . buprenorphine-naloxone  1 tablet Sublingual BID  . carvedilol  25 mg Oral BID WC  . Chlorhexidine Gluconate Cloth  6 each Topical Daily  . cloNIDine  0.3 mg Transdermal Weekly  . dronabinol  5 mg Oral BID AC  . heparin  5,000 Units Subcutaneous Q8H  . insulin aspart  0-5 Units Subcutaneous QHS  . insulin aspart  0-9 Units Subcutaneous TID WC  . insulin aspart  3 Units Subcutaneous TID WC  . insulin glargine  13 Units Subcutaneous Daily  . megestrol  40 mg Oral BID  . metoCLOPramide (REGLAN) injection  5 mg Intravenous Q8H  .  mupirocin ointment  1 application Nasal BID  . nystatin ointment   Topical BID  . pantoprazole  40 mg Oral Daily   Continuous Infusions: . sodium chloride     PRN Meds:.haloperidol lactate, hydrALAZINE, morphine injection, ondansetron (ZOFRAN) IV, oxyCODONE, promethazine, tiZANidine   PHYSICAL EXAM: Vital signs: Vitals:   10/22/17 1030 10/22/17 1545 10/22/17 2216 10/23/17 0443  BP:  133/67 (!) 142/80 (!) 148/88  Pulse: 100 85 83 89  Resp:  14 16 18   Temp: 98 F (36.7 C) 98.6 F (37 C) (!) 100.4 F (38 C) 97.6 F (36.4 C)  TempSrc: Oral  Oral Oral  SpO2: 95% 98% 98% 100%  Weight:    88.9 kg  Height:       Filed Weights   10/19/17 2058 10/22/17 0500 10/23/17 0443  Weight: 88.5 kg 91.4 kg 88.9 kg   Body mass index is 24.89 kg/m.   Exam  Awake Alert, Oriented X 3, No new F.N deficits, Normal affect Steele.AT,PERRAL Supple Neck,No JVD, No cervical lymphadenopathy appriciated.  Symmetrical Chest wall movement, Good air movement bilaterally, CTAB RRR,No Gallops, Rubs or new Murmurs, No Parasternal Heave +ve B.Sounds, Abd Soft, No tenderness, No organomegaly appriciated, No rebound - guarding or rigidity. No Cyanosis, Clubbing or edema, No new Rash or  bruise Scrotal area under bandage   I have personally reviewed following labs and imaging studies  LABORATORY DATA: CBC: Recent Labs  Lab 10/16/17 1250  10/20/17 0253 10/20/17 1154 10/21/17 1004 10/22/17 0750 10/23/17 0704  WBC 28.5*   < > 28.9* 28.0* 19.5* 24.7* 21.8*  NEUTROABS 25.4*  --   --   --   --   --   --   HGB 8.5*   < > 9.1* 9.0* 8.8* 8.4* 8.1*  HCT 25.8*   < > 28.4* 28.8* 28.3* 27.5* 26.9*  MCV 89.9   < > 91.3 91.1 92.5 92.3 95.7  PLT 350   < > 399 410* 458* 524* 566*   < > = values in this interval not displayed.    Basic Metabolic Panel: Recent Labs  Lab 10/19/17 0424  10/20/17 0253 10/20/17 1154 10/21/17 1004 10/22/17 0416 10/22/17 0750 10/23/17 0704  NA 132*   < > 135 136 136 138  --  135  K 3.8   < > 4.2 4.1 3.5 3.7  --  4.2  CL 98  --  103 102 107 113*  --  104  CO2 22  --  19* 24 18* 18*  --  13*  GLUCOSE 325*   < > 276* 196* 97 118*  --  341*  BUN 57*  --  56* 53* 49* 44*  --  45*  CREATININE 6.42*  6.40*  --  6.11* 5.70* 5.39* 5.20*  --  5.31*  CALCIUM 8.5*  --  8.5* 8.7* 8.2* 8.0*  --  8.7*  MG  --   --   --   --   --   --  1.9 1.8  PHOS 2.7  --   --   --   --   --   --   --    < > = values in this interval not displayed.    GFR: Estimated Creatinine Clearance: 23 mL/min (A) (by C-G formula based on SCr of 5.31 mg/dL (H)).  Liver Function Tests: Recent Labs  Lab 10/16/17 1250 10/19/17 0424  AST 18  --   ALT 7  --   ALKPHOS 88  --  BILITOT 0.8  --   PROT 7.8  --   ALBUMIN 3.7 2.4*   No results for input(s): LIPASE, AMYLASE in the last 168 hours. No results for input(s): AMMONIA in the last 168 hours.  Coagulation Profile: No results for input(s): INR, PROTIME in the last 168 hours.  Cardiac Enzymes: No results for input(s): CKTOTAL, CKMB, CKMBINDEX, TROPONINI in the last 168 hours.  BNP (last 3 results) No results for input(s): PROBNP in the last 8760 hours.  HbA1C: No results for input(s): HGBA1C in the last 72  hours.  CBG: Recent Labs  Lab 10/22/17 1228 10/22/17 1634 10/22/17 2217 10/23/17 0813 10/23/17 1158  GLUCAP 176* 182* 170* 337* 339*    Lipid Profile: No results for input(s): CHOL, HDL, LDLCALC, TRIG, CHOLHDL, LDLDIRECT in the last 72 hours.  Thyroid Function Tests: No results for input(s): TSH, T4TOTAL, FREET4, T3FREE, THYROIDAB in the last 72 hours.  Anemia Panel: No results for input(s): VITAMINB12, FOLATE, FERRITIN, TIBC, IRON, RETICCTPCT in the last 72 hours.  Urine analysis:    Component Value Date/Time   COLORURINE YELLOW 10/17/2017 0125   APPEARANCEUR HAZY (A) 10/17/2017 0125   LABSPEC 1.014 10/17/2017 0125   PHURINE 5.0 10/17/2017 0125   GLUCOSEU >=500 (A) 10/17/2017 0125   HGBUR MODERATE (A) 10/17/2017 0125   BILIRUBINUR NEGATIVE 10/17/2017 0125   KETONESUR 20 (A) 10/17/2017 0125   PROTEINUR 100 (A) 10/17/2017 0125   UROBILINOGEN 1.0 11/13/2014 0155   NITRITE NEGATIVE 10/17/2017 0125   LEUKOCYTESUR NEGATIVE 10/17/2017 0125    Sepsis Labs: Lactic Acid, Venous    Component Value Date/Time   LATICACIDVEN 1.58 10/16/2017 1318    MICROBIOLOGY: Recent Results (from the past 240 hour(s))  Blood Culture (routine x 2)     Status: None   Collection Time: 10/16/17 12:50 PM  Result Value Ref Range Status   Specimen Description BLOOD LEFT ANTECUBITAL  Final   Special Requests   Final    BOTTLES DRAWN AEROBIC AND ANAEROBIC Blood Culture results may not be optimal due to an inadequate volume of blood received in culture bottles   Culture   Final    NO GROWTH 5 DAYS Performed at [facility], [address], [place], [place] [address]    Report Status 10/21/2017 FINAL  Final  Blood Culture (routine x 2)     Status: None   Collection Time: 10/16/17  9:07 PM  Result Value Ref Range Status   Specimen Description LEFT ANTECUBITAL  Final   Special Requests   Final    BOTTLES DRAWN AEROBIC AND ANAEROBIC Blood Culture adequate volume   Culture   Final    NO GROWTH  5 DAYS Performed at [facility], [address], [place], [place] [address]    Report Status 10/21/2017 FINAL  Final  MRSA PCR Screening     Status: None   Collection Time: 10/17/17  4:37 AM  Result Value Ref Range Status   MRSA by PCR NEGATIVE NEGATIVE Final    Comment:        The GeneXpert MRSA Assay (FDA approved for NASAL specimens only), is one component of a comprehensive MRSA colonization surveillance program. It is not intended to diagnose MRSA infection nor to guide or monitor treatment for MRSA infections. Performed at [facility], [address], [place], [place] [address]   Culture, blood (routine x 2)     Status: None   Collection Time: 10/18/17  6:05 PM  Result Value Ref Range Status   Specimen  Description BLOOD LEFT HAND  Final   Special Requests   Final    BOTTLES DRAWN AEROBIC AND ANAEROBIC Blood Culture adequate volume   Culture   Final    NO GROWTH 5 DAYS Performed at [facility], [address] [place] [address], [place], [place] [address]    Report Status 10/23/2017 FINAL  Final  Culture, blood (routine x 2)     Status: None   Collection Time: 10/18/17  6:09 PM  Result Value Ref Range Status   Specimen Description BLOOD LEFT HAND  Final   Special Requests   Final    BOTTLES DRAWN AEROBIC AND ANAEROBIC Blood Culture adequate volume   Culture   Final    NO GROWTH 5 DAYS Performed at [facility], [address], [place], [place] [address]    Report Status 10/23/2017 FINAL  Final  Aerobic/Anaerobic Culture (surgical/deep wound)     Status: None   Collection Time: 10/19/17 11:27 PM  Result Value Ref Range Status   Specimen Description ABSCESS SCROTUM  Final   Special Requests PATIENT ON FOLLOWING VANC DOXYCYCLINE CEFEPIME  Final   Gram Stain   Final    ABUNDANT WBC PRESENT, PREDOMINANTLY PMN ABUNDANT GRAM NEGATIVE RODS ABUNDANT GRAM POSITIVE COCCI Performed at [facility], [place] [address], [place], [place] [address]    Culture   Final    MODERATE ENTEROCOCCUS  AVIUM MIXED ANAEROBIC FLORA PRESENT.  CALL LAB IF FURTHER IID REQUIRED.    Report Status 10/23/2017 FINAL  Final   Organism ID, Bacteria ENTEROCOCCUS AVIUM  Final      Susceptibility   Enterococcus avium - MIC*    AMPICILLIN <=2 SENSITIVE Sensitive     VANCOMYCIN <=0.5 SENSITIVE Sensitive     GENTAMICIN SYNERGY SENSITIVE Sensitive     * MODERATE ENTEROCOCCUS AVIUM  Surgical pcr screen     Status: Abnormal   Collection Time: 10/20/17 10:21 PM  Result Value Ref Range Status   MRSA, PCR NEGATIVE NEGATIVE Final   Staphylococcus aureus POSITIVE (A) NEGATIVE Final    Comment: RESULT CALLED TO, READ BACK BY AND VERIFIED WITH: RN Orvan Seen [phone] @0151  THANEY Performed at [facility] [address], [place], [place] [address]     RADIOLOGY STUDIES/RESULTS: Ct Abdomen Pelvis Wo Contrast  Result Date: 10/16/2017 CLINICAL DATA:  Four-day history of pain, erythema and swelling involving the RIGHT hemiscrotum, associated with fever. Current history of end-stage renal disease for which the patient had a an AV fistula placed on 04/09/2017. Surgical history includes cholecystectomy. EXAM: CT ABDOMEN AND PELVIS WITHOUT CONTRAST TECHNIQUE: Multidetector CT imaging of the abdomen and pelvis was performed following the standard protocol without IV contrast. COMPARISON:  07/10/2017, 05/29/2017 and earlier. FINDINGS: Lower chest: Minimal linear scar/atelectasis in the lingula and both lower lobes. Visualized lung bases otherwise clear. Resolution of RIGHT LOWER LOBE pneumonia since 07/10/2017. Heart mildly enlarged with RIGHT coronary artery atherosclerosis. Hepatobiliary: Normal unenhanced appearance of the liver. Surgically absent gallbladder. No unexpected biliary ductal dilation. Pancreas: Normal unenhanced appearance. Spleen: Normal unenhanced appearance. Adrenals/Urinary Tract: Normal appearing adrenal glands. No evidence of urinary tract calculi or obstruction on either side. Within the limits  of the unenhanced technique, no focal parenchymal abnormality involving either kidney. Normal appearing urinary bladder. Stomach/Bowel: Thickening of the wall of the distal esophagus to the level of the EG junction as noted previously. Normal appearing decompressed stomach. Normal-appearing small bowel. Normal-appearing colon with expected stool burden. Normal appendix in the RIGHT UPPER pelvis. Vascular/Lymphatic: Moderate  aorto-iliofemoral atherosclerosis without evidence of aneurysm. Enlarged LEFT periaortic retroperitoneal lymph nodes, the largest node measuring approximately 2.3 x 2.1 cm (series 2, image 34), increased in size since the most recent prior CT 07/10/2017 where it measured 1.8 x 1.7 cm at the same level. Multiple upper normal size to slightly enlarged BILATERAL superficial inguinal lymph nodes, increased in size since the most recent prior CT. Reproductive: Prostate gland and seminal vesicles normal in size and appearance for age. Extensive edema/inflammation involving the RIGHT hemiscrotum with marked skin thickening and a likely fluid collection within the thickened skin measuring approximately 1.6 cm (series 3, image 18). Mild edema/inflammation involving the contralateral LEFT hemiscrotum. Large BILATERAL hydroceles. No evidence of gas within the scrotal tissues. Other: None. Musculoskeletal: Large Schmorl's node in the UPPER endplate of W58 with mild compression deformity of the UPPER endplate of K99, unchanged. No new/acute findings. IMPRESSION: 1. Edema/inflammation involving the RIGHT hemiscrotum with marked skin thickening and a likely abscess within the thickened skin. Edema/inflammation to a lesser degree involves the contralateral LEFT hemiscrotum. Large BILATERAL hydroceles are present. No evidence of gas within the scrotal tissues to suggest Fournier's gangrene. Scrotal ultrasound is recommended in further evaluation to better delineate these findings. 2. Enlarging LEFT periaortic  lymph nodes and BILATERAL superficial inguinal lymph nodes since the most recent prior CT 3 months ago. While these are statistically likely reactive, a low-grade lymphoproliferative disorder could have a similar appearance. 3. Stable marked thickening of the wall of the visualized distal esophagus to the level of the EG junction dating back to at least 2017, likely chronic esophagitis. 4.  Aortic Atherosclerosis, advanced for patient age.  (ICD10-170.0) Electronically Signed   By: Evangeline Dakin M.D.   On: 10/16/2017 15:10   US Scrotum W/doppler  Result Date: 10/16/2017 CLINICAL DATA:  Right-sided pain with edema and redness over the last 4 days EXAM: SCROTAL ULTRASOUND DOPPLER ULTRASOUND OF THE TESTICLES TECHNIQUE: Complete ultrasound examination of the testicles, epididymis, and other scrotal structures was performed. Color and spectral Doppler ultrasound were also utilized to evaluate blood flow to the testicles. COMPARISON:  CT abdomen pelvis of 10/16/2016 FINDINGS: Right testicle Measurements: The right testicle measures 3.9 x 2.4 x 2.6 cm. No intratesticular abnormality is seen. There is blood flow to the right testicle with arterial and venous waveforms. Left testicle Measurements: The left testicle measures 4.2 x 2.7 x 2.6 cm. No intratesticular abnormality is seen. Blood flow is demonstrated to the left testicle with arterial and venous waveforms. Right epididymis:  The right epididymis is unremarkable. Left epididymis: The left epididymis appears enlarged and inhomogeneous as well as hypervascular suggesting left epididymitis. Hydrocele: There is a small right hydrocele present which appears partially septated. Varicocele:  No varicocele is seen. Pulsed Doppler interrogation of both testes demonstrates normal low resistance arterial and venous waveforms bilaterally. There is diffuse scrotal edema and skin thickening present. No discrete abscess is detected. IMPRESSION: 1. Diffusely edematous scrotal  wall.  No abscess. 2. Irregular and enlarged left epididymis with hypervascularity suggestive of left epididymitis. 3. No intratesticular abnormality is seen. Blood flow is demonstrated to both testicles with arterial and venous waveforms. 4. Small right hydrocele which appears septated. Electronically Signed   By: Ivar Drape M.D.   On: 10/16/2017 16:22     LOS: 7 days   Lala Lund, MD  [facility]  If 7PM-7AM, please contact night-coverage  Please page via [email or web address]-click on MD name and type text message  10/23/2017, 12:26 PM

## 2017-10-23 NOTE — Addendum Note (Signed)
Addended by: Drucilla Schmidt on: 10/23/2017 03:32 PM   Modules accepted: Orders

## 2017-10-23 NOTE — Telephone Encounter (Signed)
Sent!

## 2017-10-23 NOTE — Progress Notes (Addendum)
RN responded to pt bed alarm, and pt found to be standing at edge of bed using urinal. RN assisted pt with use of urinal and back to bed. Dressing soiled and upon removing dressing, penrose drain x 1 found to be dislodged. Surgical site redressed and urology service notified via paging/receptionist system. Orders recieved. Pt comfortably resting in bed with PRN pain medication. Fall and safety policy re-enforced with pt, with verbalization of understanding. Call bell in reach and bed alarm on for pt safety.

## 2017-10-23 NOTE — Consult Note (Signed)
Urology Progress Note   S/p scrotal exploration, debridement and washout 10/20/17  S/p 2nd look washout and primary closure 10/21/17  Subjective: Patient Foley catheter was removed yesterday.  He is voiding on his own. After having a bowel movement, both of his drains were inadvertently removed. Patient had a low-grade fever overnight to 100.4. The patient's pain is improving slightly, he is feeling better.  Objective: Vital signs in last 24 hours: Temp:  [97.6 F (36.4 C)-100.4 F (38 C)] 97.6 F (36.4 C) (09/17 0443) Pulse Rate:  [83-102] 89 (09/17 0443) Resp:  [14-18] 18 (09/17 0443) BP: (133-148)/(67-88) 148/88 (09/17 0443) SpO2:  [95 %-100 %] 100 % (09/17 0443) Weight:  [88.9 kg] 88.9 kg (09/17 0443)  Intake/Output from previous day: 09/16 0701 - 09/17 0700 In: 614.6 [P.O.:350; I.V.:64.6; IV Piggyback:200] Out: 935 [Urine:935] Intake/Output this shift: No intake/output data recorded.  Physical Exam:  General: alert if redirected but drowsy, able to answer questions appropriately intermittently CV: RRR Lungs: Clear Abdomen: Soft, appropriately tender.  GU: Foley in place draining clear yellow urine. Scrotal wound loosely closed with nylon stitches.  Penrose drains have been removed.  There is fibrinous exudate along the suture line that will need wet-to-dry debridement.  The left hemiscrotum is less edematous today and softer.  The infrapubic area is not erythematous.  His perineum is supple. Ext: NT, No erythema  Lab Results: Recent Labs    10/20/17 1154 10/21/17 1004 10/22/17 0750  HGB 9.0* 8.8* 8.4*  HCT 28.8* 28.3* 27.5*   BMET Recent Labs    10/21/17 1004 10/22/17 0416  NA 136 138  K 3.5 3.7  CL 107 113*  CO2 18* 18*  GLUCOSE 97 118*  BUN 49* 44*  CREATININE 5.39* 5.20*  CALCIUM 8.2* 8.0*     Studies/Results: No results found.  Assessment/Plan:  32 y.o. male w/ uncontrolled T1DM, developed Fournier's gangrene. S/p initial debridemennt on  10/20/17. S/p 2nd look washout and loose primary closure with drains on 10/21/17.  The wound cultures are growing enterococcus.   -Unfortunately, his Penrose drains were removed.  Incision may need to be opened if infection starts to reaccumulate.   -The suture line has fibrinous exudate that needs to be debrided with wet-to-dry dressings 3 times daily. -Scrotum needs to be elevated to minimize edema. -Narrow antibiotic coverage, would consider a curbside consult to infectious disease for their opinion.  Augmentin may be a good choice for him. -Urology will continue to follow along with daily exams.    LOS: 7 days   Ardis Hughs 10/23/2017, 6:41 AM

## 2017-10-24 LAB — COMPREHENSIVE METABOLIC PANEL
ALBUMIN: 2.3 g/dL — AB (ref 3.5–5.0)
ALT: 9 U/L (ref 0–44)
ANION GAP: 15 (ref 5–15)
AST: 11 U/L — ABNORMAL LOW (ref 15–41)
Alkaline Phosphatase: 87 U/L (ref 38–126)
BILIRUBIN TOTAL: 0.8 mg/dL (ref 0.3–1.2)
BUN: 39 mg/dL — ABNORMAL HIGH (ref 6–20)
CO2: 16 mmol/L — ABNORMAL LOW (ref 22–32)
Calcium: 8.9 mg/dL (ref 8.9–10.3)
Chloride: 106 mmol/L (ref 98–111)
Creatinine, Ser: 5.18 mg/dL — ABNORMAL HIGH (ref 0.61–1.24)
GFR calc Af Amer: 15 mL/min — ABNORMAL LOW (ref >60)
GFR calc non Af Amer: 13 mL/min — ABNORMAL LOW (ref >60)
GLUCOSE: 331 mg/dL — AB (ref 70–99)
POTASSIUM: 4 mmol/L (ref 3.5–5.1)
Sodium: 137 mmol/L (ref 135–145)
TOTAL PROTEIN: 6.4 g/dL — AB (ref 6.5–8.1)

## 2017-10-24 LAB — CBC
HEMATOCRIT: 30.2 % — AB (ref 39.0–52.0)
Hemoglobin: 9.2 g/dL — ABNORMAL LOW (ref 13.0–17.0)
MCH: 28.5 pg (ref 26.0–34.0)
MCHC: 30.5 g/dL (ref 30.0–36.0)
MCV: 93.5 fL (ref 78.0–100.0)
Platelets: 714 10*3/uL — ABNORMAL HIGH (ref 150–400)
RBC: 3.23 MIL/uL — ABNORMAL LOW (ref 4.22–5.81)
RDW: 16.3 % — AB (ref 11.5–15.5)
WBC: 23.5 10*3/uL — ABNORMAL HIGH (ref 4.0–10.5)

## 2017-10-24 LAB — GLUCOSE, CAPILLARY
GLUCOSE-CAPILLARY: 232 mg/dL — AB (ref 70–99)
GLUCOSE-CAPILLARY: 169 mg/dL — AB (ref 70–99)
Glucose-Capillary: 322 mg/dL — ABNORMAL HIGH (ref 70–99)
Glucose-Capillary: 166 mg/dL — ABNORMAL HIGH (ref 70–99)

## 2017-10-24 MED ORDER — SODIUM BICARBONATE 650 MG PO TABS
650.0000 mg | ORAL_TABLET | Freq: Three times a day (TID) | ORAL | Status: DC
  Administered 2017-10-24 – 2017-10-28 (×9): 650 mg via ORAL
  Filled 2017-10-24 (×14): qty 1

## 2017-10-24 MED ORDER — INSULIN GLARGINE 100 UNIT/ML ~~LOC~~ SOLN
25.0000 [IU] | Freq: Two times a day (BID) | SUBCUTANEOUS | Status: DC
  Administered 2017-10-24 – 2017-10-26 (×4): 25 [IU] via SUBCUTANEOUS
  Filled 2017-10-24 (×5): qty 0.25

## 2017-10-24 MED ORDER — INSULIN GLARGINE 100 UNIT/ML ~~LOC~~ SOLN
10.0000 [IU] | Freq: Once | SUBCUTANEOUS | Status: AC
  Administered 2017-10-24: 10 [IU] via SUBCUTANEOUS
  Filled 2017-10-24 (×2): qty 0.1

## 2017-10-24 NOTE — Progress Notes (Signed)
Inpatient Diabetes Program Recommendations  AACE/ADA: New Consensus Statement on Inpatient Glycemic Control (2015)  Target Ranges:  Prepandial:   less than 140 mg/dL      Peak postprandial:   less than 180 mg/dL (1-2 hours)      Critically ill patients:  140 - 180 mg/dL   Results for ERIQ, HUFFORD (MRN [id number]) as of 10/24/2017 10:05  Ref. Range 10/23/2017 08:13 10/23/2017 11:58 10/23/2017 17:05 10/23/2017 19:48 10/23/2017 22:11 10/24/2017 08:27  Glucose-Capillary Latest Ref Range: 70 - 99 mg/dL 337 (H) 339 (H) 197 (H) 153 (H) 225 (H) 322 (H)   Review of Glycemic Control  Diabetes history: DM 1 Outpatient Diabetes medications: Basaglar 6 units BID, Novolog/Humalog 10 units tid (Sees Kumar) Current orders for Inpatient glycemic control: Lantus 15 units BID, Novolog 0-9 units tid, Novolog 0-5 units qhs, Novolog 6 units tid meal coverage  Inpatient Diabetes Program Recommendations:  Fasting glucose 300's. Consider an overall increase in basal insulin.  Thanks,  Tama Headings RN, MSN, BC-ADM Inpatient Diabetes Coordinator Team Pager [phone] (8a-5p)

## 2017-10-24 NOTE — Consult Note (Signed)
Urology Progress Note   S/p scrotal exploration, debridement and washout 10/20/17  S/p 2nd look washout and primary closure 10/21/17  Subjective: Afebrile with normal vitals last 24 hrs WBC 23.5 this morning (has hovered between 19.5 - 25 post op) Patient refusing all dressing to scrotum, including wet to dry Not much for conversation this morning, discomfort seems controlled  Objective: Vital signs in last 24 hours: Temp:  [98.1 F (36.7 C)-98.7 F (37.1 C)] 98.1 F (36.7 C) (09/18 0521) Pulse Rate:  [78-86] 86 (09/18 0521) Resp:  [16-20] 18 (09/18 0521) BP: (130-147)/(76-83) 147/83 (09/18 0521) SpO2:  [97 %-99 %] 97 % (09/18 0521)  Intake/Output from previous day: 09/17 0701 - 09/18 0700 In: 807.2 [P.O.:540; I.V.:267.2] Out: 1300 [Urine:1300] Intake/Output this shift: No intake/output data recorded.  Physical Exam:  General: alert if redirected but drowsy, able to answer questions appropriately intermittently CV: RRR Lungs: Clear Abdomen: Soft, appropriately tender.  GU:  Scrotal wound loosely closed with nylon stitches.  Penrose drains have been removed.  There is fibrinous exudate along the suture line. There is not expression of purulent drainage. Overlying erythema and tenderness is improving, as well as edema. Suprapubic region and perineum look okay. Still some dusky areas of skin edges but not worsening, may continue to slough, but majority of closure appears okay.  Ext: NT, No erythema  Lab Results: Recent Labs    10/22/17 0750 10/23/17 0704 10/24/17 0510  HGB 8.4* 8.1* 9.2*  HCT 27.5* 26.9* 30.2*   BMET Recent Labs    10/23/17 0704 10/24/17 0510  NA 135 137  K 4.2 4.0  CL 104 106  CO2 13* 16*  GLUCOSE 341* 331*  BUN 45* 39*  CREATININE 5.31* 5.18*  CALCIUM 8.7* 8.9     Studies/Results: No results found.  Assessment/Plan:  32 y.o. male w/ uncontrolled T1DM, developed Fournier's gangrene. S/p initial debridemennt on 10/20/17. S/p 2nd look  washout and loose primary closure with drains on 10/21/17.  The wound cultures are growing enterococcus.  -The suture line has fibrinous exudate that needs to be debrided with wet-to-dry dressings 3 times daily.    - Recommend wound nurse consult and assistance with care. (ordered) -Scrotum needs to be elevated to minimize edema. -Narrow antibiotic coverage, would consider a curbside consult to infectious disease for their opinion.  Augmentin may be a good choice for him, as well as possible continued anaerobic coverage. -Urology will continue to follow along with daily exams.    LOS: 8 days   Fredricka Bonine 10/24/2017, 8:15 AM

## 2017-10-24 NOTE — Consult Note (Signed)
[facility] nurse consulted for dressing changes. After review of the chart it appears wound care orders have been entered and the bedside nursing staff are able to perform the dressing changes ordered. For that reason the [facility] nurse will not consult at this time.  [place], [place], [place]

## 2017-10-24 NOTE — Progress Notes (Signed)
Pt alert and oriented X4. Pt removed dressing on scrotum stated that he don't need it at this time. Educated pt on dressing after surgery and risk of infection. Pt stated he understand but still don't want it.

## 2017-10-24 NOTE — Progress Notes (Signed)
PROGRESS NOTE        PATIENT DETAILS Name: Brent Daniels Age: 32 y.o. Sex: male Date of Birth: [birth year] Admit Date: 10/16/2017 Admitting Physician Jani Gravel, MD LXB:WIOM, Costella Hatcher, MD  Brief Narrative: Patient is a 32 y.o. male with prior history of DM-1, gastroparesis, CKD stage V-admitted to [facility] on 9/10 sepsis secondary to scrotal cellulitis, AKI on CKD stage V-patient was started on broad-spectrum IV antimicrobial therapy, IV fluids and other supportive care-with these measures, patient's renal function slowly improved, however he developed Fournier's gangrene, patient was transferred to [facility] on 9/13 and underwent scrotal exploration and debridement on 9/14.  See below for further details.  Subjective: Patient in bed, appears comfortable, denies any headache, no fever, no chest pain or pressure, no shortness of breath , no abdominal pain. No focal weakness.  Pain and his appetite have improved.    Assessment/Plan:  Sepsis secondary to scrotal cellulitis with progression to Fournier's gangrene: Sepsis pathophysiology has resolved, he was treated empirically with IV vancomycin wound cultures seem to be growing pansensitive enterococcus, he was switched to oral Augmentin on 10/23/2017 after discussions with ID Dr. Johnnye Sima, urology is on board and he is status post 2 scrotal washouts on 10/20/2013 and 10/21/2013 respectively, he had a retained Penrose drain in his scrotum but unfortunately patient pulled it out on 10/22/2017, may require another washout.  To the staff he is not allowing dressing changes as scheduled, patient was counseled.  ARF on CKD stage V: Plan creatinine 4.7, ARF has resolved and he is now close to his baseline.  Continue to monitor.  Hypertension: Blood pressure seems to be reasonably stable with clonidine, amlodipine, will switch IV metoprolol to back to Coreg.  Follow.  Intractable vomiting: Likely due to exacerbation of his  gastroparesis, resolved with Reglan and supportive care.  Normocytic anemia: Suspect secondary to chronic disease, worsened slightly due to acute illness.  Follow.  Opioid dependence: Continue Suboxone.  Per prior note from Dr. Richrd Sox was last filled on 9/9-1-week supply.  Tobacco abuse: Counseled.  Poor appetite.  Added Megace along with Marinol and prostat.    DM 1: Blood sugars have been labile as his oral intake is pretty erratic, increase Lantus further on 10/24/2017 added pre-meal NovoLog, continue sliding scale, monitor and adjust.  CBG (last 3)  Recent Labs    10/23/17 1948 10/23/17 2211 10/24/17 0827  GLUCAP 153* 225* 322*   Lab Results  Component Value Date   HGBA1C 8.1 (A) 09/19/2017      DVT Prophylaxis: Prophylactic Heparin   Code Status: Full code   Family Communication: None at bedside  Disposition Plan: Medical bed  Antimicrobial agents: Anti-infectives (From admission, onward)   Start     Dose/Rate Route Frequency Ordered Stop   10/23/17 1000  amoxicillin-clavulanate (AUGMENTIN) 500-125 MG per tablet 500 mg     1 tablet Oral 2 times daily 10/23/17 0917     10/22/17 1130  amoxicillin-clavulanate (AUGMENTIN) 400-57 MG/5ML suspension 400 mg  Status:  Discontinued     400 mg Oral Every 12 hours 10/22/17 1040 10/23/17 0917   10/22/17 1045  amoxicillin-clavulanate (AUGMENTIN) 875-125 MG per tablet 1 tablet  Status:  Discontinued     1 tablet Oral Every 12 hours 10/22/17 1039 10/22/17 1040   10/21/17 0802  gentamicin (GARAMYCIN) 80 mg in sodium chloride 0.9 %  500 mL irrigation  Status:  Discontinued       As needed 10/21/17 0820 10/21/17 0849   10/21/17 0645  gentamicin (GARAMYCIN) 80 mg in sodium chloride 0.9 % 500 mL irrigation  Status:  Discontinued      Irrigation To Surgery 10/21/17 0636 10/22/17 0645   10/19/17 2230  ceFEPIme (MAXIPIME) 1 g in sodium chloride 0.9 % 100 mL IVPB  Status:  Discontinued     1 g 200 mL/hr over 30 Minutes Intravenous  Every 24 hours 10/19/17 2217 10/22/17 1039   10/19/17 2215  gentamicin (GARAMYCIN) 80 mg in sodium chloride 0.9 % 500 mL irrigation      Irrigation To Surgery 10/19/17 2200 10/19/17 2325   10/19/17 2215  gentamicin (GARAMYCIN) 80 mg in sodium chloride 0.9 % 500 mL irrigation      Irrigation To Surgery 10/19/17 2202 10/19/17 2324   10/19/17 2215  metroNIDAZOLE (FLAGYL) IVPB 500 mg  Status:  Discontinued     500 mg 100 mL/hr over 60 Minutes Intravenous Every 8 hours 10/19/17 2216 10/22/17 1039   10/18/17 1200  vancomycin (VANCOCIN) IVPB 1000 mg/200 mL premix  Status:  Discontinued     1,000 mg 200 mL/hr over 60 Minutes Intravenous Every 48 hours 10/16/17 1438 10/23/17 0917   10/18/17 1000  doxycycline (VIBRA-TABS) tablet 100 mg  Status:  Discontinued     100 mg Oral Every 12 hours 10/18/17 0933 10/20/17 1043   10/17/17 0900  doxycycline (VIBRAMYCIN) 100 mg in sodium chloride 0.9 % 250 mL IVPB  Status:  Discontinued     100 mg 125 mL/hr over 120 Minutes Intravenous Every 12 hours 10/17/17 0749 10/18/17 0933   10/16/17 2000  ceFEPIme (MAXIPIME) 2 g in sodium chloride 0.9 % 100 mL IVPB     2 g 200 mL/hr over 30 Minutes Intravenous  Once 10/16/17 1937 10/16/17 2200   10/16/17 1500  vancomycin (VANCOCIN) IVPB 1000 mg/200 mL premix     1,000 mg 200 mL/hr over 60 Minutes Intravenous  Once 10/16/17 1356 10/16/17 1654   10/16/17 1245  vancomycin (VANCOCIN) IVPB 1000 mg/200 mL premix     1,000 mg 200 mL/hr over 60 Minutes Intravenous  Once 10/16/17 1239 10/16/17 1457   10/16/17 1245  cefTRIAXone (ROCEPHIN) 2 g in sodium chloride 0.9 % 100 mL IVPB  Status:  Discontinued     2 g 200 mL/hr over 30 Minutes Intravenous Every 24 hours 10/16/17 1239 10/19/17 2222      Procedures: 9/14>> Scrotal exploration +washout, Debridement of devitalized tissue  9/15 - 2nd washout  CONSULTS:  urology  Time spent: 35 minutes-Greater than 50% of this time was spent in counseling, explanation of diagnosis,  planning of further management, and coordination of care.  MEDICATIONS: Scheduled Meds: . amLODipine  10 mg Oral Daily  . amoxicillin-clavulanate  1 tablet Oral BID  . buprenorphine-naloxone  1 tablet Sublingual BID  . carvedilol  25 mg Oral BID WC  . Chlorhexidine Gluconate Cloth  6 each Topical Daily  . cloNIDine  0.3 mg Transdermal Weekly  . dronabinol  5 mg Oral BID AC  . feeding supplement (PRO-STAT SUGAR FREE 64)  30 mL Oral TID WC  . heparin  5,000 Units Subcutaneous Q8H  . insulin aspart  0-5 Units Subcutaneous QHS  . insulin aspart  0-9 Units Subcutaneous TID WC  . insulin aspart  6 Units Subcutaneous TID WC  . insulin glargine  15 Units Subcutaneous BID  . megestrol  40 mg  Oral BID  . metoCLOPramide (REGLAN) injection  5 mg Intravenous Q8H  . mupirocin ointment  1 application Nasal BID  . nystatin ointment   Topical BID  . pantoprazole  40 mg Oral Daily  . sodium bicarbonate  650 mg Oral TID   Continuous Infusions: . sodium chloride 75 mL/hr at 10/23/17 1707   PRN Meds:.haloperidol lactate, hydrALAZINE, morphine injection, ondansetron (ZOFRAN) IV, oxyCODONE, promethazine, tiZANidine   PHYSICAL EXAM: Vital signs: Vitals:   10/23/17 1431 10/23/17 1431 10/23/17 2043 10/24/17 0521  BP:  130/77 136/76 (!) 147/83  Pulse:  78 79 86  Resp:  20 16 18   Temp: 98.7 F (37.1 C)  98.4 F (36.9 C) 98.1 F (36.7 C)  TempSrc: Oral  Oral Oral  SpO2:  99% 99% 97%  Weight:      Height:       Filed Weights   10/19/17 2058 10/22/17 0500 10/23/17 0443  Weight: 88.5 kg 91.4 kg 88.9 kg   Body mass index is 24.89 kg/m.   Exam  Awake Alert, Oriented X 3, No new F.N deficits, Normal affect [place].AT,PERRAL Supple Neck,No JVD, No cervical lymphadenopathy appriciated.  Symmetrical Chest wall movement, Good air movement bilaterally, CTAB RRR,No Gallops, Rubs or new Murmurs, No Parasternal Heave +ve B.Sounds, Abd Soft, No tenderness, No organomegaly appriciated, No rebound -  guarding or rigidity. No Cyanosis, Clubbing or edema, No new Rash or bruise Scrotal area under bandage   I have personally reviewed following labs and imaging studies  LABORATORY DATA: CBC: Recent Labs  Lab 10/20/17 1154 10/21/17 1004 10/22/17 0750 10/23/17 0704 10/24/17 0510  WBC 28.0* 19.5* 24.7* 21.8* 23.5*  HGB 9.0* 8.8* 8.4* 8.1* 9.2*  HCT 28.8* 28.3* 27.5* 26.9* 30.2*  MCV 91.1 92.5 92.3 95.7 93.5  PLT 410* 458* 524* 566* 714*    Basic Metabolic Panel: Recent Labs  Lab 10/19/17 0424  10/20/17 1154 10/21/17 1004 10/22/17 0416 10/22/17 0750 10/23/17 0704 10/24/17 0510  NA 132*   < > 136 136 138  --  135 137  K 3.8   < > 4.1 3.5 3.7  --  4.2 4.0  CL 98   < > 102 107 113*  --  104 106  CO2 22   < > 24 18* 18*  --  13* 16*  GLUCOSE 325*   < > 196* 97 118*  --  341* 331*  BUN 57*   < > 53* 49* 44*  --  45* 39*  CREATININE 6.42*  6.40*   < > 5.70* 5.39* 5.20*  --  5.31* 5.18*  CALCIUM 8.5*   < > 8.7* 8.2* 8.0*  --  8.7* 8.9  MG  --   --   --   --   --  1.9 1.8  --   PHOS 2.7  --   --   --   --   --   --   --    < > = values in this interval not displayed.    GFR: Estimated Creatinine Clearance: 23.6 mL/min (A) (by C-G formula based on SCr of 5.18 mg/dL (H)).  Liver Function Tests: Recent Labs  Lab 10/19/17 0424 10/24/17 0510  AST  --  11*  ALT  --  9  ALKPHOS  --  87  BILITOT  --  0.8  PROT  --  6.4*  ALBUMIN 2.4* 2.3*   No results for input(s): LIPASE, AMYLASE in the last 168 hours. No results for input(s): AMMONIA in  the last 168 hours.  Coagulation Profile: No results for input(s): INR, PROTIME in the last 168 hours.  Cardiac Enzymes: No results for input(s): CKTOTAL, CKMB, CKMBINDEX, TROPONINI in the last 168 hours.  BNP (last 3 results) No results for input(s): PROBNP in the last 8760 hours.  HbA1C: No results for input(s): HGBA1C in the last 72 hours.  CBG: Recent Labs  Lab 10/23/17 1158 10/23/17 1705 10/23/17 1948 10/23/17 2211  10/24/17 0827  GLUCAP 339* 197* 153* 225* 322*    Lipid Profile: No results for input(s): CHOL, HDL, LDLCALC, TRIG, CHOLHDL, LDLDIRECT in the last 72 hours.  Thyroid Function Tests: No results for input(s): TSH, T4TOTAL, FREET4, T3FREE, THYROIDAB in the last 72 hours.  Anemia Panel: No results for input(s): VITAMINB12, FOLATE, FERRITIN, TIBC, IRON, RETICCTPCT in the last 72 hours.  Urine analysis:    Component Value Date/Time   COLORURINE YELLOW 10/17/2017 0125   APPEARANCEUR HAZY (A) 10/17/2017 0125   LABSPEC 1.014 10/17/2017 0125   PHURINE 5.0 10/17/2017 0125   GLUCOSEU >=500 (A) 10/17/2017 0125   HGBUR MODERATE (A) 10/17/2017 0125   BILIRUBINUR NEGATIVE 10/17/2017 0125   KETONESUR 20 (A) 10/17/2017 0125   PROTEINUR 100 (A) 10/17/2017 0125   UROBILINOGEN 1.0 11/13/2014 0155   NITRITE NEGATIVE 10/17/2017 0125   LEUKOCYTESUR NEGATIVE 10/17/2017 0125    Sepsis Labs: Lactic Acid, Venous    Component Value Date/Time   LATICACIDVEN 1.58 10/16/2017 1318    MICROBIOLOGY: Recent Results (from the past 240 hour(s))  Blood Culture (routine x 2)     Status: None   Collection Time: 10/16/17 12:50 PM  Result Value Ref Range Status   Specimen Description BLOOD LEFT ANTECUBITAL  Final   Special Requests   Final    BOTTLES DRAWN AEROBIC AND ANAEROBIC Blood Culture results may not be optimal due to an inadequate volume of blood received in culture bottles   Culture   Final    NO GROWTH 5 DAYS Performed at [facility], [address], [place], [place] [address]    Report Status 10/21/2017 FINAL  Final  Blood Culture (routine x 2)     Status: None   Collection Time: 10/16/17  9:07 PM  Result Value Ref Range Status   Specimen Description LEFT ANTECUBITAL  Final   Special Requests   Final    BOTTLES DRAWN AEROBIC AND ANAEROBIC Blood Culture adequate volume   Culture   Final    NO GROWTH 5 DAYS Performed at [facility], [address] [place], [place] [address]    Report  Status 10/21/2017 FINAL  Final  MRSA PCR Screening     Status: None   Collection Time: 10/17/17  4:37 AM  Result Value Ref Range Status   MRSA by PCR NEGATIVE NEGATIVE Final    Comment:        The GeneXpert MRSA Assay (FDA approved for NASAL specimens only), is one component of a comprehensive MRSA colonization surveillance program. It is not intended to diagnose MRSA infection nor to guide or monitor treatment for MRSA infections. Performed at [facility], [address] [place],  [address]   Culture, blood (routine x 2)     Status: None   Collection Time: 10/18/17  6:05 PM  Result Value Ref Range Status   Specimen Description BLOOD LEFT HAND  Final   Special Requests   Final    BOTTLES DRAWN AEROBIC AND ANAEROBIC Blood Culture adequate volume   Culture   Final  NO GROWTH 5 DAYS Performed at [facility], [address] [place], [place] [address]    Report Status 10/23/2017 FINAL  Final  Culture, blood (routine x 2)     Status: None   Collection Time: 10/18/17  6:09 PM  Result Value Ref Range Status   Specimen Description BLOOD LEFT HAND  Final   Special Requests   Final    BOTTLES DRAWN AEROBIC AND ANAEROBIC Blood Culture adequate volume   Culture   Final    NO GROWTH 5 DAYS Performed at [facility], [address] [place], [place] [address]    Report Status 10/23/2017 FINAL  Final  Aerobic/Anaerobic Culture (surgical/deep wound)     Status: None   Collection Time: 10/19/17 11:27 PM  Result Value Ref Range Status   Specimen Description ABSCESS SCROTUM  Final   Special Requests PATIENT ON FOLLOWING VANC DOXYCYCLINE CEFEPIME  Final   Gram Stain   Final    ABUNDANT WBC PRESENT, PREDOMINANTLY PMN ABUNDANT GRAM NEGATIVE RODS ABUNDANT GRAM POSITIVE COCCI Performed at [facility], [place] [address], [place], [place] [address]    Culture   Final    MODERATE ENTEROCOCCUS AVIUM MIXED ANAEROBIC FLORA PRESENT.  CALL LAB IF FURTHER IID REQUIRED.    Report  Status 10/23/2017 FINAL  Final   Organism ID, Bacteria ENTEROCOCCUS AVIUM  Final      Susceptibility   Enterococcus avium - MIC*    AMPICILLIN <=2 SENSITIVE Sensitive     VANCOMYCIN <=0.5 SENSITIVE Sensitive     GENTAMICIN SYNERGY SENSITIVE Sensitive     * MODERATE ENTEROCOCCUS AVIUM  Surgical pcr screen     Status: Abnormal   Collection Time: 10/20/17 10:21 PM  Result Value Ref Range Status   MRSA, PCR NEGATIVE NEGATIVE Final   Staphylococcus aureus POSITIVE (A) NEGATIVE Final    Comment: RESULT CALLED TO, READ BACK BY AND VERIFIED WITH: RN Orvan Seen [phone] @0151  THANEY Performed at [facility] [address] [place], [place] [address]     RADIOLOGY STUDIES/RESULTS: Ct Abdomen Pelvis Wo Contrast  Result Date: 10/16/2017 CLINICAL DATA:  Four-day history of pain, erythema and swelling involving the RIGHT hemiscrotum, associated with fever. Current history of end-stage renal disease for which the patient had a an AV fistula placed on 04/09/2017. Surgical history includes cholecystectomy. EXAM: CT ABDOMEN AND PELVIS WITHOUT CONTRAST TECHNIQUE: Multidetector CT imaging of the abdomen and pelvis was performed following the standard protocol without IV contrast. COMPARISON:  07/10/2017, 05/29/2017 and earlier. FINDINGS: Lower chest: Minimal linear scar/atelectasis in the lingula and both lower lobes. Visualized lung bases otherwise clear. Resolution of RIGHT LOWER LOBE pneumonia since 07/10/2017. Heart mildly enlarged with RIGHT coronary artery atherosclerosis. Hepatobiliary: Normal unenhanced appearance of the liver. Surgically absent gallbladder. No unexpected biliary ductal dilation. Pancreas: Normal unenhanced appearance. Spleen: Normal unenhanced appearance. Adrenals/Urinary Tract: Normal appearing adrenal glands. No evidence of urinary tract calculi or obstruction on either side. Within the limits of the unenhanced technique, no focal parenchymal abnormality involving either kidney.  Normal appearing urinary bladder. Stomach/Bowel: Thickening of the wall of the distal esophagus to the level of the EG junction as noted previously. Normal appearing decompressed stomach. Normal-appearing small bowel. Normal-appearing colon with expected stool burden. Normal appendix in the RIGHT UPPER pelvis. Vascular/Lymphatic: Moderate aorto-iliofemoral atherosclerosis without evidence of aneurysm. Enlarged LEFT periaortic retroperitoneal lymph nodes, the largest node measuring approximately 2.3 x 2.1 cm (series 2, image 34), increased in size since the most recent prior CT  07/10/2017 where it measured 1.8 x 1.7 cm at the same level. Multiple upper normal size to slightly enlarged BILATERAL superficial inguinal lymph nodes, increased in size since the most recent prior CT. Reproductive: Prostate gland and seminal vesicles normal in size and appearance for age. Extensive edema/inflammation involving the RIGHT hemiscrotum with marked skin thickening and a likely fluid collection within the thickened skin measuring approximately 1.6 cm (series 3, image 18). Mild edema/inflammation involving the contralateral LEFT hemiscrotum. Large BILATERAL hydroceles. No evidence of gas within the scrotal tissues. Other: None. Musculoskeletal: Large Schmorl's node in the UPPER endplate of X32 with mild compression deformity of the UPPER endplate of G40, unchanged. No new/acute findings. IMPRESSION: 1. Edema/inflammation involving the RIGHT hemiscrotum with marked skin thickening and a likely abscess within the thickened skin. Edema/inflammation to a lesser degree involves the contralateral LEFT hemiscrotum. Large BILATERAL hydroceles are present. No evidence of gas within the scrotal tissues to suggest Fournier's gangrene. Scrotal ultrasound is recommended in further evaluation to better delineate these findings. 2. Enlarging LEFT periaortic lymph nodes and BILATERAL superficial inguinal lymph nodes since the most recent prior CT  3 months ago. While these are statistically likely reactive, a low-grade lymphoproliferative disorder could have a similar appearance. 3. Stable marked thickening of the wall of the visualized distal esophagus to the level of the EG junction dating back to at least 2017, likely chronic esophagitis. 4.  Aortic Atherosclerosis, advanced for patient age.  (ICD10-170.0) Electronically Signed   By: Evangeline Dakin M.D.   On: 10/16/2017 15:10   US Scrotum W/doppler  Result Date: 10/16/2017 CLINICAL DATA:  Right-sided pain with edema and redness over the last 4 days EXAM: SCROTAL ULTRASOUND DOPPLER ULTRASOUND OF THE TESTICLES TECHNIQUE: Complete ultrasound examination of the testicles, epididymis, and other scrotal structures was performed. Color and spectral Doppler ultrasound were also utilized to evaluate blood flow to the testicles. COMPARISON:  CT abdomen pelvis of 10/16/2016 FINDINGS: Right testicle Measurements: The right testicle measures 3.9 x 2.4 x 2.6 cm. No intratesticular abnormality is seen. There is blood flow to the right testicle with arterial and venous waveforms. Left testicle Measurements: The left testicle measures 4.2 x 2.7 x 2.6 cm. No intratesticular abnormality is seen. Blood flow is demonstrated to the left testicle with arterial and venous waveforms. Right epididymis:  The right epididymis is unremarkable. Left epididymis: The left epididymis appears enlarged and inhomogeneous as well as hypervascular suggesting left epididymitis. Hydrocele: There is a small right hydrocele present which appears partially septated. Varicocele:  No varicocele is seen. Pulsed Doppler interrogation of both testes demonstrates normal low resistance arterial and venous waveforms bilaterally. There is diffuse scrotal edema and skin thickening present. No discrete abscess is detected. IMPRESSION: 1. Diffusely edematous scrotal wall.  No abscess. 2. Irregular and enlarged left epididymis with hypervascularity  suggestive of left epididymitis. 3. No intratesticular abnormality is seen. Blood flow is demonstrated to both testicles with arterial and venous waveforms. 4. Small right hydrocele which appears septated. Electronically Signed   By: Ivar Drape M.D.   On: 10/16/2017 16:22     LOS: 8 days   Lala Lund, MD  [facility]  If 7PM-7AM, please contact night-coverage  Please page via [email or web address] [email or web address]-click on MD name and type text message  10/24/2017, 11:15 AM

## 2017-10-24 NOTE — Progress Notes (Signed)
Pt did not eat any breakfast this AM and only 25% of lunch. Pt currently eating grilled cheese sandwich but states he is still hungry. He called to order additional try but was told by nutrition staff that he would be over his limit for sodium and potassium. Since pt has eaten less than 25% all day, he ordered another tray since PO intake poor.

## 2017-10-25 LAB — COMPREHENSIVE METABOLIC PANEL
ALBUMIN: 2 g/dL — AB (ref 3.5–5.0)
ALK PHOS: 75 U/L (ref 38–126)
ALT: 8 U/L (ref 0–44)
ANION GAP: 9 (ref 5–15)
AST: 11 U/L — AB (ref 15–41)
BILIRUBIN TOTAL: 0.5 mg/dL (ref 0.3–1.2)
BUN: 34 mg/dL — AB (ref 6–20)
CALCIUM: 8.4 mg/dL — AB (ref 8.9–10.3)
CO2: 20 mmol/L — ABNORMAL LOW (ref 22–32)
Chloride: 110 mmol/L (ref 98–111)
Creatinine, Ser: 4.84 mg/dL — ABNORMAL HIGH (ref 0.61–1.24)
GFR calc Af Amer: 17 mL/min — ABNORMAL LOW (ref >60)
GFR calc non Af Amer: 14 mL/min — ABNORMAL LOW (ref >60)
GLUCOSE: 200 mg/dL — AB (ref 70–99)
Potassium: 3.5 mmol/L (ref 3.5–5.1)
Sodium: 139 mmol/L (ref 135–145)
TOTAL PROTEIN: 5.5 g/dL — AB (ref 6.5–8.1)

## 2017-10-25 LAB — GLUCOSE, CAPILLARY
GLUCOSE-CAPILLARY: 51 mg/dL — AB (ref 70–99)
GLUCOSE-CAPILLARY: 82 mg/dL (ref 70–99)
Glucose-Capillary: 198 mg/dL — ABNORMAL HIGH (ref 70–99)
Glucose-Capillary: 119 mg/dL — ABNORMAL HIGH (ref 70–99)
Glucose-Capillary: 228 mg/dL — ABNORMAL HIGH (ref 70–99)

## 2017-10-25 LAB — CBC
HEMATOCRIT: 25.1 % — AB (ref 39.0–52.0)
Hemoglobin: 7.6 g/dL — ABNORMAL LOW (ref 13.0–17.0)
MCH: 28.4 pg (ref 26.0–34.0)
MCHC: 30.3 g/dL (ref 30.0–36.0)
MCV: 93.7 fL (ref 78.0–100.0)
Platelets: 637 10*3/uL — ABNORMAL HIGH (ref 150–400)
RBC: 2.68 MIL/uL — ABNORMAL LOW (ref 4.22–5.81)
RDW: 16.4 % — ABNORMAL HIGH (ref 11.5–15.5)
WBC: 20.3 10*3/uL — ABNORMAL HIGH (ref 4.0–10.5)

## 2017-10-25 LAB — RETICULOCYTES
RBC.: 2.86 MIL/uL — ABNORMAL LOW (ref 4.22–5.81)
RETIC COUNT ABSOLUTE: 48.6 10*3/uL (ref 19.0–186.0)
RETIC CT PCT: 1.7 % (ref 0.4–3.1)

## 2017-10-25 LAB — TYPE AND SCREEN
ABO/RH(D): A POS
Antibody Screen: NEGATIVE

## 2017-10-25 LAB — FERRITIN: FERRITIN: 336 ng/mL (ref 24–336)

## 2017-10-25 LAB — IRON AND TIBC
Iron: 32 ug/dL — ABNORMAL LOW (ref 45–182)
Saturation Ratios: 19 % (ref 17.9–39.5)
TIBC: 167 ug/dL — ABNORMAL LOW (ref 250–450)
UIBC: 135 ug/dL

## 2017-10-25 LAB — FOLATE: Folate: 12.9 ng/mL (ref >5.9)

## 2017-10-25 LAB — VITAMIN B12: Vitamin B-12: 410 pg/mL (ref 180–914)

## 2017-10-25 MED ORDER — POTASSIUM CHLORIDE CRYS ER 20 MEQ PO TBCR
40.0000 meq | EXTENDED_RELEASE_TABLET | Freq: Once | ORAL | Status: AC
  Administered 2017-10-25: 40 meq via ORAL
  Filled 2017-10-25: qty 2

## 2017-10-25 MED ORDER — COLLAGENASE 250 UNIT/GM EX OINT
TOPICAL_OINTMENT | Freq: Every day | CUTANEOUS | Status: DC
  Administered 2017-10-26 – 2017-10-29 (×4): via TOPICAL
  Filled 2017-10-25 (×2): qty 30

## 2017-10-25 NOTE — Progress Notes (Signed)
Results for MACLEAN, FOISTER (MRN [id number]) as of 10/25/2017 16:33  Ref. Range 10/25/2017 16:31  Glucose-Capillary Latest Ref Range: 70 - 99 mg/dL 51 (L)  Patient currently eating a snack and orange juice given.

## 2017-10-25 NOTE — Consult Note (Signed)
Urology Progress Note   S/p scrotal exploration, debridement and washout 10/20/17  S/p 2nd look washout and primary closure 10/21/17  Subjective: No acute events overnight. The patient tells me know in helping him change his dressings, as a result there are not no dressings in place. The patient's pain is reasonably well controlled. The patient is afebrile with stable vital signs  Objective: Vital signs in last 24 hours: Temp:  [98.4 F (36.9 C)-98.9 F (37.2 C)] 98.9 F (37.2 C) (09/18 2203) Pulse Rate:  [76-79] 76 (09/19 0936) Resp:  [16] 16 (09/18 2203) BP: (126-156)/(65-82) 156/82 (09/19 0936) SpO2:  [95 %-100 %] 96 % (09/19 0936)  Intake/Output from previous day: No intake/output data recorded. Intake/Output this shift: No intake/output data recorded.  Physical Exam:  General: alert if redirected but drowsy, able to answer questions appropriately intermittently CV: RRR Lungs: Clear Abdomen: Soft, appropriately tender.  GU:  Scrotal wound loosely closed with nylon stitches.  Penrose drains have been removed.  There is fibrinous exudate along the suture line. There is not expression of purulent drainage. Overlying erythema and tenderness is improving, as well as edema. Suprapubic region and perineum still look okay. Still some dusky areas of skin edges but not worsening, may continue to slough, but majority of closure appears okay.  Ext: NT, No erythema  Lab Results: Recent Labs    10/23/17 0704 10/24/17 0510 10/25/17 0448  HGB 8.1* 9.2* 7.6*  HCT 26.9* 30.2* 25.1*   BMET Recent Labs    10/24/17 0510 10/25/17 0448  NA 137 139  K 4.0 3.5  CL 106 110  CO2 16* 20*  GLUCOSE 331* 200*  BUN 39* 34*  CREATININE 5.18* 4.84*  CALCIUM 8.9 8.4*     Studies/Results: No results found.  Assessment/Plan:  32 y.o. male w/ uncontrolled T1DM, developed Fournier's gangrene. S/p initial debridemennt on 10/20/17. S/p 2nd look washout and loose primary closure with drains  on 10/21/17.  The wound cultures are growing enterococcus.  I am concerned about his wound, as it has not been well managed for various reasons.  He no longer has any drains, and at this point it looks like the wound may be closing and trapping any fluid and infection.  Fortunately the wound looks unchanged today and his white count is down.  I spent a long time counseling the patient about the importance of wound care and I also educated him on how to take care of it himself so that this is something he can do moving forward.  It is imperative that he take responsibility for some of this as well as be cooperative with the staff so that he can make progress and ultimately go home.  I did ask the wound care nurse to reconsult on him to reinforce the education as well as intervene or change the wound care plan as she saw it.  We will reevaluate the patient tomorrow morning and see if he needs to be taken back on tomorrow afternoon for a washout and replacement of the drains.   -Urology will continue to follow along with daily exams.    LOS: 9 days   Ardis Hughs 10/25/2017, 12:08 PM

## 2017-10-25 NOTE — Progress Notes (Signed)
PROGRESS NOTE        PATIENT DETAILS Name: Brent Daniels Age: 32 y.o. Sex: male Date of Birth: 1985-03-24 Admit Date: 10/16/2017 Admitting Physician Jani Gravel, MD JEH:UDJS, Costella Hatcher, MD  Brief Narrative: Patient is a 32 y.o. male with prior history of DM-1, gastroparesis, CKD stage V-admitted to [facility] on 9/10 sepsis secondary to scrotal cellulitis, AKI on CKD stage V-patient was started on broad-spectrum IV antimicrobial therapy, IV fluids and other supportive care-with these measures, patient's renal function slowly improved, however he developed Fournier's gangrene, patient was transferred to [facility] on 9/13 and underwent scrotal exploration and debridement on 9/14.  See below for further details.  Subjective: Patient in bed, appears comfortable, denies any headache, no fever, no chest pain or pressure, no shortness of breath , no abdominal pain. No focal weakness.  He will has some scrotal pain, appetite has improved.    Assessment/Plan:  Sepsis secondary to scrotal cellulitis with progression to Fournier's gangrene: Sepsis pathophysiology has resolved, he was treated empirically with IV vancomycin wound cultures seem to be growing pansensitive enterococcus, he was switched to oral Augmentin on 10/23/2017 after discussions with ID Dr. Johnnye Sima, urology is on board and he is status post 2 scrotal washouts on 10/20/2013 and 10/21/2013 respectively, he had a retained Penrose drain in his scrotum but unfortunately patient pulled it out on 10/22/2017, may require another washout.  To the staff he is not allowing dressing changes as scheduled, patient was counseled.  ARF on CKD stage V: Plan creatinine 4.7, ARF has resolved and he is now close to his baseline.  Continue to monitor.  Hypertension: Blood pressure seems to be reasonably stable with clonidine, amlodipine, will switch IV metoprolol to back to Coreg.  Follow.  Intractable vomiting: Likely due to  exacerbation of his gastroparesis, resolved with Reglan and supportive care.  Normocytic anemia: Anemia panel is noncontributory, B12 levels and folate levels are stable, this likely is anemia of chronic disease, type screen done will monitor.  Opioid dependence: Continue Suboxone.  Per prior note from Dr. Richrd Sox was last filled on 9/9-1-week supply.  Tobacco abuse: Counseled.  Poor appetite.  Added Megace along with Marinol and prostat.    DM 1: Blood sugars have been labile as his oral intake is pretty erratic, increase Lantus further on 10/24/2017 added pre-meal NovoLog, continue sliding scale, monitor and adjust.  CBG (last 3)  Recent Labs    10/24/17 1647 10/24/17 2205 10/25/17 0731  GLUCAP 169* 166* 198*   Lab Results  Component Value Date   HGBA1C 8.1 (A) 09/19/2017      DVT Prophylaxis: Prophylactic Heparin   Code Status: Full code   Family Communication: None at bedside  Disposition Plan: Medical bed  Antimicrobial agents: Anti-infectives (From admission, onward)   Start     Dose/Rate Route Frequency Ordered Stop   10/23/17 1000  amoxicillin-clavulanate (AUGMENTIN) 500-125 MG per tablet 500 mg     1 tablet Oral 2 times daily 10/23/17 0917     10/22/17 1130  amoxicillin-clavulanate (AUGMENTIN) 400-57 MG/5ML suspension 400 mg  Status:  Discontinued     400 mg Oral Every 12 hours 10/22/17 1040 10/23/17 0917   10/22/17 1045  amoxicillin-clavulanate (AUGMENTIN) 875-125 MG per tablet 1 tablet  Status:  Discontinued     1 tablet Oral Every 12 hours 10/22/17 1039 10/22/17 1040  10/21/17 0802  gentamicin (GARAMYCIN) 80 mg in sodium chloride 0.9 % 500 mL irrigation  Status:  Discontinued       As needed 10/21/17 0820 10/21/17 0849   10/21/17 0645  gentamicin (GARAMYCIN) 80 mg in sodium chloride 0.9 % 500 mL irrigation  Status:  Discontinued      Irrigation To Surgery 10/21/17 0636 10/22/17 0645   10/19/17 2230  ceFEPIme (MAXIPIME) 1 g in sodium chloride 0.9 % 100  mL IVPB  Status:  Discontinued     1 g 200 mL/hr over 30 Minutes Intravenous Every 24 hours 10/19/17 2217 10/22/17 1039   10/19/17 2215  gentamicin (GARAMYCIN) 80 mg in sodium chloride 0.9 % 500 mL irrigation      Irrigation To Surgery 10/19/17 2200 10/19/17 2325   10/19/17 2215  gentamicin (GARAMYCIN) 80 mg in sodium chloride 0.9 % 500 mL irrigation      Irrigation To Surgery 10/19/17 2202 10/19/17 2324   10/19/17 2215  metroNIDAZOLE (FLAGYL) IVPB 500 mg  Status:  Discontinued     500 mg 100 mL/hr over 60 Minutes Intravenous Every 8 hours 10/19/17 2216 10/22/17 1039   10/18/17 1200  vancomycin (VANCOCIN) IVPB 1000 mg/200 mL premix  Status:  Discontinued     1,000 mg 200 mL/hr over 60 Minutes Intravenous Every 48 hours 10/16/17 1438 10/23/17 0917   10/18/17 1000  doxycycline (VIBRA-TABS) tablet 100 mg  Status:  Discontinued     100 mg Oral Every 12 hours 10/18/17 0933 10/20/17 1043   10/17/17 0900  doxycycline (VIBRAMYCIN) 100 mg in sodium chloride 0.9 % 250 mL IVPB  Status:  Discontinued     100 mg 125 mL/hr over 120 Minutes Intravenous Every 12 hours 10/17/17 0749 10/18/17 0933   10/16/17 2000  ceFEPIme (MAXIPIME) 2 g in sodium chloride 0.9 % 100 mL IVPB     2 g 200 mL/hr over 30 Minutes Intravenous  Once 10/16/17 1937 10/16/17 2200   10/16/17 1500  vancomycin (VANCOCIN) IVPB 1000 mg/200 mL premix     1,000 mg 200 mL/hr over 60 Minutes Intravenous  Once 10/16/17 1356 10/16/17 1654   10/16/17 1245  vancomycin (VANCOCIN) IVPB 1000 mg/200 mL premix     1,000 mg 200 mL/hr over 60 Minutes Intravenous  Once 10/16/17 1239 10/16/17 1457   10/16/17 1245  cefTRIAXone (ROCEPHIN) 2 g in sodium chloride 0.9 % 100 mL IVPB  Status:  Discontinued     2 g 200 mL/hr over 30 Minutes Intravenous Every 24 hours 10/16/17 1239 10/19/17 2222      Procedures: 9/14>> Scrotal exploration +washout, Debridement of devitalized tissue  9/15 - 2nd washout  CONSULTS:  urology  Time spent: 35  minutes-Greater than 50% of this time was spent in counseling, explanation of diagnosis, planning of further management, and coordination of care.  MEDICATIONS: Scheduled Meds: . amLODipine  10 mg Oral Daily  . amoxicillin-clavulanate  1 tablet Oral BID  . buprenorphine-naloxone  1 tablet Sublingual BID  . carvedilol  25 mg Oral BID WC  . Chlorhexidine Gluconate Cloth  6 each Topical Daily  . cloNIDine  0.3 mg Transdermal Weekly  . dronabinol  5 mg Oral BID AC  . feeding supplement (PRO-STAT SUGAR FREE 64)  30 mL Oral TID WC  . heparin  5,000 Units Subcutaneous Q8H  . insulin aspart  0-5 Units Subcutaneous QHS  . insulin aspart  0-9 Units Subcutaneous TID WC  . insulin aspart  6 Units Subcutaneous TID WC  . insulin  glargine  25 Units Subcutaneous BID  . megestrol  40 mg Oral BID  . metoCLOPramide (REGLAN) injection  5 mg Intravenous Q8H  . mupirocin ointment  1 application Nasal BID  . nystatin ointment   Topical BID  . pantoprazole  40 mg Oral Daily  . sodium bicarbonate  650 mg Oral TID   Continuous Infusions: . sodium chloride 75 mL/hr at 10/25/17 0948   PRN Meds:.haloperidol lactate, hydrALAZINE, morphine injection, ondansetron (ZOFRAN) IV, oxyCODONE, promethazine, tiZANidine   PHYSICAL EXAM: Vital signs: Vitals:   10/24/17 0521 10/24/17 1622 10/24/17 2203 10/25/17 0936  BP: (!) 147/83 130/77 126/65 (!) 156/82  Pulse: 86 79 77 76  Resp: 18 16 16    Temp: 98.1 F (36.7 C) 98.4 F (36.9 C) 98.9 F (37.2 C)   TempSrc: Oral Oral Oral   SpO2: 97% 100% 95% 96%  Weight:      Height:       Filed Weights   10/19/17 2058 10/22/17 0500 10/23/17 0443  Weight: 88.5 kg 91.4 kg 88.9 kg   Body mass index is 24.89 kg/m.   Exam  Awake Alert, Oriented X 3, No new F.N deficits, Normal affect [place].AT,PERRAL Supple Neck,No JVD, No cervical lymphadenopathy appriciated.  Symmetrical Chest wall movement, Good air movement bilaterally, CTAB RRR,No Gallops, Rubs, No Parasternal  Heave +ve B.Sounds, Abd Soft, No tenderness, No organomegaly appriciated, No rebound - guarding or rigidity. No Cyanosis, Clubbing or edema, No new Rash or bruise Scrotal area under bandage, he also has a chronic aortic systolic murmur  I have personally reviewed following labs and imaging studies  LABORATORY DATA: CBC: Recent Labs  Lab 10/21/17 1004 10/22/17 0750 10/23/17 0704 10/24/17 0510 10/25/17 0448  WBC 19.5* 24.7* 21.8* 23.5* 20.3*  HGB 8.8* 8.4* 8.1* 9.2* 7.6*  HCT 28.3* 27.5* 26.9* 30.2* 25.1*  MCV 92.5 92.3 95.7 93.5 93.7  PLT 458* 524* 566* 714* 637*    Basic Metabolic Panel: Recent Labs  Lab 10/19/17 0424  10/21/17 1004 10/22/17 0416 10/22/17 0750 10/23/17 0704 10/24/17 0510 10/25/17 0448  NA 132*   < > 136 138  --  135 137 139  K 3.8   < > 3.5 3.7  --  4.2 4.0 3.5  CL 98   < > 107 113*  --  104 106 110  CO2 22   < > 18* 18*  --  13* 16* 20*  GLUCOSE 325*   < > 97 118*  --  341* 331* 200*  BUN 57*   < > 49* 44*  --  45* 39* 34*  CREATININE 6.42*  6.40*   < > 5.39* 5.20*  --  5.31* 5.18* 4.84*  CALCIUM 8.5*   < > 8.2* 8.0*  --  8.7* 8.9 8.4*  MG  --   --   --   --  1.9 1.8  --   --   PHOS 2.7  --   --   --   --   --   --   --    < > = values in this interval not displayed.    GFR: Estimated Creatinine Clearance: 25.3 mL/min (A) (by C-G formula based on SCr of 4.84 mg/dL (H)).  Liver Function Tests: Recent Labs  Lab 10/19/17 0424 10/24/17 0510 10/25/17 0448  AST  --  11* 11*  ALT  --  9 8  ALKPHOS  --  87 75  BILITOT  --  0.8 0.5  PROT  --  6.4*  5.5*  ALBUMIN 2.4* 2.3* 2.0*   No results for input(s): LIPASE, AMYLASE in the last 168 hours. No results for input(s): AMMONIA in the last 168 hours.  Coagulation Profile: No results for input(s): INR, PROTIME in the last 168 hours.  Cardiac Enzymes: No results for input(s): CKTOTAL, CKMB, CKMBINDEX, TROPONINI in the last 168 hours.  BNP (last 3 results) No results for input(s): PROBNP in  the last 8760 hours.  HbA1C: No results for input(s): HGBA1C in the last 72 hours.  CBG: Recent Labs  Lab 10/24/17 0827 10/24/17 1249 10/24/17 1647 10/24/17 2205 10/25/17 0731  GLUCAP 322* 232* 169* 166* 198*    Lipid Profile: No results for input(s): CHOL, HDL, LDLCALC, TRIG, CHOLHDL, LDLDIRECT in the last 72 hours.  Thyroid Function Tests: No results for input(s): TSH, T4TOTAL, FREET4, T3FREE, THYROIDAB in the last 72 hours.  Anemia Panel: Recent Labs    10/25/17 0750  VITAMINB12 410  FOLATE 12.9  FERRITIN 336  TIBC 167*  IRON 32*  RETICCTPCT 1.7    Urine analysis:    Component Value Date/Time   COLORURINE YELLOW 10/17/2017 0125   APPEARANCEUR HAZY (A) 10/17/2017 0125   LABSPEC 1.014 10/17/2017 0125   PHURINE 5.0 10/17/2017 0125   GLUCOSEU >=500 (A) 10/17/2017 0125   HGBUR MODERATE (A) 10/17/2017 0125   BILIRUBINUR NEGATIVE 10/17/2017 0125   KETONESUR 20 (A) 10/17/2017 0125   PROTEINUR 100 (A) 10/17/2017 0125   UROBILINOGEN 1.0 11/13/2014 0155   NITRITE NEGATIVE 10/17/2017 0125   LEUKOCYTESUR NEGATIVE 10/17/2017 0125    Sepsis Labs: Lactic Acid, Venous    Component Value Date/Time   LATICACIDVEN 1.58 10/16/2017 1318    MICROBIOLOGY: Recent Results (from the past 240 hour(s))  Blood Culture (routine x 2)     Status: None   Collection Time: 10/16/17 12:50 PM  Result Value Ref Range Status   Specimen Description BLOOD LEFT ANTECUBITAL  Final   Special Requests   Final    BOTTLES DRAWN AEROBIC AND ANAEROBIC Blood Culture results may not be optimal due to an inadequate volume of blood received in culture bottles   Culture   Final    NO GROWTH 5 DAYS Performed at [facility], [address], [place], [place] [address]    Report Status 10/21/2017 FINAL  Final  Blood Culture (routine x 2)     Status: None   Collection Time: 10/16/17  9:07 PM  Result Value Ref Range Status   Specimen Description LEFT ANTECUBITAL  Final   Special Requests   Final     BOTTLES DRAWN AEROBIC AND ANAEROBIC Blood Culture adequate volume   Culture   Final    NO GROWTH 5 DAYS Performed at [facility], [address] [place], [place] [address]    Report Status 10/21/2017 FINAL  Final  MRSA PCR Screening     Status: None   Collection Time: 10/17/17  4:37 AM  Result Value Ref Range Status   MRSA by PCR NEGATIVE NEGATIVE Final    Comment:        The GeneXpert MRSA Assay (FDA approved for NASAL specimens only), is one component of a comprehensive MRSA colonization surveillance program. It is not intended to diagnose MRSA infection nor to guide or monitor treatment for MRSA infections. Performed at [facility], [address], [place], [place] [address]   Culture, blood (routine x 2)     Status: None   Collection Time: 10/18/17  6:05 PM  Result Value Ref Range Status  Specimen Description BLOOD LEFT HAND  Final   Special Requests   Final    BOTTLES DRAWN AEROBIC AND ANAEROBIC Blood Culture adequate volume   Culture   Final    NO GROWTH 5 DAYS Performed at [facility], [address] [place], [place] [address]    Report Status 10/23/2017 FINAL  Final  Culture, blood (routine x 2)     Status: None   Collection Time: 10/18/17  6:09 PM  Result Value Ref Range Status   Specimen Description BLOOD LEFT HAND  Final   Special Requests   Final    BOTTLES DRAWN AEROBIC AND ANAEROBIC Blood Culture adequate volume   Culture   Final    NO GROWTH 5 DAYS Performed at [facility], [address], [place], [place] [address]    Report Status 10/23/2017 FINAL  Final  Aerobic/Anaerobic Culture (surgical/deep wound)     Status: None   Collection Time: 10/19/17 11:27 PM  Result Value Ref Range Status   Specimen Description ABSCESS SCROTUM  Final   Special Requests PATIENT ON FOLLOWING VANC DOXYCYCLINE CEFEPIME  Final   Gram Stain   Final    ABUNDANT WBC PRESENT, PREDOMINANTLY PMN ABUNDANT GRAM NEGATIVE RODS ABUNDANT GRAM POSITIVE COCCI Performed at [facility], [place] [address] [place], [place] [address]    Culture   Final    MODERATE ENTEROCOCCUS AVIUM MIXED ANAEROBIC FLORA PRESENT.  CALL LAB IF FURTHER IID REQUIRED.    Report Status 10/23/2017 FINAL  Final   Organism ID, Bacteria ENTEROCOCCUS AVIUM  Final      Susceptibility   Enterococcus avium - MIC*    AMPICILLIN <=2 SENSITIVE Sensitive     VANCOMYCIN <=0.5 SENSITIVE Sensitive     GENTAMICIN SYNERGY SENSITIVE Sensitive     * MODERATE ENTEROCOCCUS AVIUM  Surgical pcr screen     Status: Abnormal   Collection Time: 10/20/17 10:21 PM  Result Value Ref Range Status   MRSA, PCR NEGATIVE NEGATIVE Final   Staphylococcus aureus POSITIVE (A) NEGATIVE Final    Comment: RESULT CALLED TO, READ BACK BY AND VERIFIED WITH: RN DYolanda Manges [phone] @0151  THANEY Performed at [facility] [address], [place],  [address]     RADIOLOGY STUDIES/RESULTS: Ct Abdomen Pelvis Wo Contrast  Result Date: 10/16/2017 CLINICAL DATA:  Four-day history of pain, erythema and swelling involving the RIGHT hemiscrotum, associated with fever. Current history of end-stage renal disease for which the patient had a an AV fistula placed on 04/09/2017. Surgical history includes cholecystectomy. EXAM: CT ABDOMEN AND PELVIS WITHOUT CONTRAST TECHNIQUE: Multidetector CT imaging of the abdomen and pelvis was performed following the standard protocol without IV contrast. COMPARISON:  07/10/2017, 05/29/2017 and earlier. FINDINGS: Lower chest: Minimal linear scar/atelectasis in the lingula and both lower lobes. Visualized lung bases otherwise clear. Resolution of RIGHT LOWER LOBE pneumonia since 07/10/2017. Heart mildly enlarged with RIGHT coronary artery atherosclerosis. Hepatobiliary: Normal unenhanced appearance of the liver. Surgically absent gallbladder. No unexpected biliary ductal dilation. Pancreas: Normal unenhanced appearance. Spleen: Normal unenhanced appearance. Adrenals/Urinary Tract: Normal appearing  adrenal glands. No evidence of urinary tract calculi or obstruction on either side. Within the limits of the unenhanced technique, no focal parenchymal abnormality involving either kidney. Normal appearing urinary bladder. Stomach/Bowel: Thickening of the wall of the distal esophagus to the level of the EG junction as noted previously. Normal appearing decompressed stomach. Normal-appearing small bowel. Normal-appearing colon with expected stool burden. Normal appendix in the RIGHT UPPER pelvis. Vascular/Lymphatic:  Moderate aorto-iliofemoral atherosclerosis without evidence of aneurysm. Enlarged LEFT periaortic retroperitoneal lymph nodes, the largest node measuring approximately 2.3 x 2.1 cm (series 2, image 34), increased in size since the most recent prior CT 07/10/2017 where it measured 1.8 x 1.7 cm at the same level. Multiple upper normal size to slightly enlarged BILATERAL superficial inguinal lymph nodes, increased in size since the most recent prior CT. Reproductive: Prostate gland and seminal vesicles normal in size and appearance for age. Extensive edema/inflammation involving the RIGHT hemiscrotum with marked skin thickening and a likely fluid collection within the thickened skin measuring approximately 1.6 cm (series 3, image 18). Mild edema/inflammation involving the contralateral LEFT hemiscrotum. Large BILATERAL hydroceles. No evidence of gas within the scrotal tissues. Other: None. Musculoskeletal: Large Schmorl's node in the UPPER endplate of F75 with mild compression deformity of the UPPER endplate of Z02, unchanged. No new/acute findings. IMPRESSION: 1. Edema/inflammation involving the RIGHT hemiscrotum with marked skin thickening and a likely abscess within the thickened skin. Edema/inflammation to a lesser degree involves the contralateral LEFT hemiscrotum. Large BILATERAL hydroceles are present. No evidence of gas within the scrotal tissues to suggest Fournier's gangrene. Scrotal ultrasound is  recommended in further evaluation to better delineate these findings. 2. Enlarging LEFT periaortic lymph nodes and BILATERAL superficial inguinal lymph nodes since the most recent prior CT 3 months ago. While these are statistically likely reactive, a low-grade lymphoproliferative disorder could have a similar appearance. 3. Stable marked thickening of the wall of the visualized distal esophagus to the level of the EG junction dating back to at least 2017, likely chronic esophagitis. 4.  Aortic Atherosclerosis, advanced for patient age.  (ICD10-170.0) Electronically Signed   By: Evangeline Dakin M.D.   On: 10/16/2017 15:10   US Scrotum W/doppler  Result Date: 10/16/2017 CLINICAL DATA:  Right-sided pain with edema and redness over the last 4 days EXAM: SCROTAL ULTRASOUND DOPPLER ULTRASOUND OF THE TESTICLES TECHNIQUE: Complete ultrasound examination of the testicles, epididymis, and other scrotal structures was performed. Color and spectral Doppler ultrasound were also utilized to evaluate blood flow to the testicles. COMPARISON:  CT abdomen pelvis of 10/16/2016 FINDINGS: Right testicle Measurements: The right testicle measures 3.9 x 2.4 x 2.6 cm. No intratesticular abnormality is seen. There is blood flow to the right testicle with arterial and venous waveforms. Left testicle Measurements: The left testicle measures 4.2 x 2.7 x 2.6 cm. No intratesticular abnormality is seen. Blood flow is demonstrated to the left testicle with arterial and venous waveforms. Right epididymis:  The right epididymis is unremarkable. Left epididymis: The left epididymis appears enlarged and inhomogeneous as well as hypervascular suggesting left epididymitis. Hydrocele: There is a small right hydrocele present which appears partially septated. Varicocele:  No varicocele is seen. Pulsed Doppler interrogation of both testes demonstrates normal low resistance arterial and venous waveforms bilaterally. There is diffuse scrotal edema and  skin thickening present. No discrete abscess is detected. IMPRESSION: 1. Diffusely edematous scrotal wall.  No abscess. 2. Irregular and enlarged left epididymis with hypervascularity suggestive of left epididymitis. 3. No intratesticular abnormality is seen. Blood flow is demonstrated to both testicles with arterial and venous waveforms. 4. Small right hydrocele which appears septated. Electronically Signed   By: Ivar Drape M.D.   On: 10/16/2017 16:22     LOS: 9 days   Lala Lund, MD  [facility]  If 7PM-7AM, please contact night-coverage  Please page via [email or web address] [email or web address]-click on MD name and type text message  10/25/2017, 10:07 AM

## 2017-10-25 NOTE — Progress Notes (Signed)
Amlodipine, carvedilol, pantoprazole, suboxone, megace, Augmentin, oxycodone, sodium bicarbonate, and potassium.  Patient took above medications and threw up within three minutes, provider notified, medications reordered.  CHG bath, Bactroban ointment, Pro- stat supplements, and heparin refused.

## 2017-10-25 NOTE — Progress Notes (Signed)
[facility] nurse discussed wound care options with Dr. Louis Meckel. I have updated wound care orders and add the enzymatic debrider order. I have requested the nursing staff to begin to teach the patient and the patient's mother on the wound care. Making sure to send the Santyl home for the patient's use at home.  [place], [place], [place]

## 2017-10-25 NOTE — Progress Notes (Signed)
Pt refused heparin and also refused dressing on his surgical wound. Educated pt, pt stated that he understand and that the previous nurse did the dressing.

## 2017-10-25 NOTE — Progress Notes (Signed)
Patient refused Santyl ointment, dressing change, MARINOL, PRO-STAT, and heparin. He may allow dressing change later after pain medication can be given.

## 2017-10-25 NOTE — Progress Notes (Signed)
Call placed to patient's mother, Simone Tuckey at [phone], per WOC order to arrange education for dressing changes. Mother has transport issues and will let nursing staff when she will be able to come.

## 2017-10-25 NOTE — Progress Notes (Signed)
Nurse found dressings placed by [facility] nurse in the trash can, patient reports he took it off because "It was irritating me", re-education given to patient, awaiting santyl from pharmacy to replace dressing.  Marinol and sodium bicarbonate refused by patient.

## 2017-10-25 NOTE — Consult Note (Signed)
[facility] Nurse wound consult note Reason for Consult: discuss needed wound care with the patient. Recommend any other wound care that may be better for the current wound status. Explain importance of patient or CG learning to perform the wound care. Wound type: surgical s/p fournier's gangrene and debridement Pressure Injury POA: NA Measurement: 16cm with sutures Wound XYI:AXKP of the suture line has yellow slough skin edge necrosis, there is one opening centrally that is 1.5cm deep. The others areas of slough are adherent with slight flucuance noted at the most distal portion of the suture line.  Drainage (amount, consistency, odor) no purulent drainage noted today when I probed the area with depth and when I pressed the sloughed skin distally. No odor Periwound:edema with erythema Dressing procedure/placement/frequency: Packed the one open area with iodoform packing strip 1/2" and lined the suture line with iodoform gauze for now.  I will discuss with urology.  We could start the use of an enzymatic debridement ointment along the suture line to hopefully clean the wound edges. However if they plan to take the patient back to the OR tomorrow would hold off on that. Would suggest continued packing of the open area to serve as a wick for any drainage. The patient does not indicate much drainage.   I have discussed with the patient the importance of good wound care to avoid further wound infections and further complications. Additionally with his diabetes glycemic control will need to be a goal. I will ask the glycemic control team to visit the patient.   The patient lives at home with his mother, she is on disability.  He reports she will support him with wound care at home. So we will regardless of the topical care needed will need to work with her to teach her to change the dressing. I have suggested the patient learn to do this care, he certainly with the use of a handheld mirror could perform. May need  HHRN but this would be short term and would only be to support the education of his mother and the patient.   I will contact urology today to discuss above.   [place], [place], [place]

## 2017-10-26 LAB — GLUCOSE, CAPILLARY
GLUCOSE-CAPILLARY: 123 mg/dL — AB (ref 70–99)
GLUCOSE-CAPILLARY: 47 mg/dL — AB (ref 70–99)
GLUCOSE-CAPILLARY: 53 mg/dL — AB (ref 70–99)
GLUCOSE-CAPILLARY: 101 mg/dL — AB (ref 70–99)
Glucose-Capillary: 71 mg/dL (ref 70–99)
Glucose-Capillary: 66 mg/dL — ABNORMAL LOW (ref 70–99)

## 2017-10-26 LAB — BASIC METABOLIC PANEL
ANION GAP: 10 (ref 5–15)
BUN: 29 mg/dL — AB (ref 6–20)
CHLORIDE: 107 mmol/L (ref 98–111)
CO2: 21 mmol/L — ABNORMAL LOW (ref 22–32)
Calcium: 8.8 mg/dL — ABNORMAL LOW (ref 8.9–10.3)
Creatinine, Ser: 4.37 mg/dL — ABNORMAL HIGH (ref 0.61–1.24)
GFR calc Af Amer: 19 mL/min — ABNORMAL LOW (ref >60)
GFR calc non Af Amer: 16 mL/min — ABNORMAL LOW (ref >60)
GLUCOSE: 98 mg/dL (ref 70–99)
POTASSIUM: 4.1 mmol/L (ref 3.5–5.1)
Sodium: 138 mmol/L (ref 135–145)

## 2017-10-26 LAB — CBC
HEMATOCRIT: 27 % — AB (ref 39.0–52.0)
HEMOGLOBIN: 8.4 g/dL — AB (ref 13.0–17.0)
MCH: 28.5 pg (ref 26.0–34.0)
MCHC: 31.1 g/dL (ref 30.0–36.0)
MCV: 91.5 fL (ref 78.0–100.0)
Platelets: 650 10*3/uL — ABNORMAL HIGH (ref 150–400)
RBC: 2.95 MIL/uL — AB (ref 4.22–5.81)
RDW: 15.9 % — ABNORMAL HIGH (ref 11.5–15.5)
WBC: 17 10*3/uL — AB (ref 4.0–10.5)

## 2017-10-26 MED ORDER — DEXTROSE 50 % IV SOLN: 1.0000 | INTRAVENOUS | Status: DC | PRN

## 2017-10-26 MED ORDER — INSULIN ASPART 100 UNIT/ML ~~LOC~~ SOLN
2.0000 [IU] | Freq: Three times a day (TID) | SUBCUTANEOUS | Status: DC
  Administered 2017-10-26: 2 [IU] via SUBCUTANEOUS

## 2017-10-26 MED ORDER — MAGNESIUM SULFATE 2 GM/50ML IV SOLN
2.0000 g | Freq: Once | INTRAVENOUS | Status: AC
  Administered 2017-10-26: 2 g via INTRAVENOUS
  Filled 2017-10-26: qty 50

## 2017-10-26 MED ORDER — DEXTROSE 50 % IV SOLN: 1.0000 | Freq: Once | INTRAVENOUS | Status: DC

## 2017-10-26 MED ORDER — INSULIN GLARGINE 100 UNIT/ML ~~LOC~~ SOLN
25.0000 [IU] | Freq: Every day | SUBCUTANEOUS | Status: DC
  Filled 2017-10-26: qty 0.25

## 2017-10-26 NOTE — Progress Notes (Signed)
Patient alert and oriented x 4, complains of pain to groin, medicated accordingly.  Dressing changed performed as ordered.  Patient removed dressing, educated on rationale for intervention.  Patient became agitated and refused to comply.    Will continue to monitor.

## 2017-10-26 NOTE — Progress Notes (Signed)
PROGRESS NOTE        PATIENT DETAILS Name: Brent Daniels Age: 32 y.o. Sex: male Date of Birth: 1985/11/20 Admit Date: 10/16/2017 Admitting Physician Jani Gravel, MD [id number], Costella Hatcher, MD  Brief Narrative: Patient is a 32 y.o. male with prior history of DM-1, gastroparesis, CKD stage V-admitted to [facility] on 9/10 sepsis secondary to scrotal cellulitis, AKI on CKD stage V-patient was started on broad-spectrum IV antimicrobial therapy, IV fluids and other supportive care-with these measures, patient's renal function slowly improved, however he developed Fournier's gangrene, patient was transferred to [facility] on 9/13 and underwent scrotal exploration and debridement on 9/14.  See below for further details.  Subjective: Patient in bed, appears comfortable, denies any headache, no fever, no chest pain or pressure, no shortness of breath , no abdominal pain. No focal weakness.  He is still having scrotal pain and discomfort.   Assessment/Plan:  Sepsis secondary to scrotal cellulitis with progression to Fournier's gangrene: Sepsis pathophysiology has resolved, he was treated empirically with IV vancomycin wound cultures seem to be growing pansensitive enterococcus, he was switched to oral Augmentin on 10/23/2017 after discussions with ID Dr. Johnnye Sima, urology is on board and he is status post 2 scrotal washouts on 10/20/2013 and 10/21/2013 respectively, he had a retained Penrose drain in his scrotum but unfortunately patient pulled it out on 10/22/2017, may require another washout.  As per the staff he is not allowing dressing changes as scheduled, patient was counseled.  He still having significant pain and discomfort may require another washout but will defer the decision to urologist. DW Dr. Kary Kos on 10/26/2017 at 10 AM.    ARF on CKD stage V: Plan creatinine 4.7, ARF has resolved and he is now close to his baseline.  Continue to monitor.  Hypertension: Blood pressure  seems to be reasonably stable with clonidine, amlodipine, will switch IV metoprolol to back to Coreg.  Follow.  Intractable vomiting: Likely due to exacerbation of his gastroparesis, resolved with Reglan and supportive care.  Normocytic anemia: Anemia panel is noncontributory, B12 levels and folate levels are stable, this likely is anemia of chronic disease, type screen done will monitor.  Opioid dependence: Continue Suboxone.  Per prior note from Dr. Richrd Sox was last filled on 9/9-1-week supply.  Tobacco abuse: Counseled.  Poor appetite.  Added Megace along with Marinol and prostat.    DM 1: Blood sugars have been labile as his oral intake is pretty erratic, increase Lantus further on 10/24/2017 added drop pre-meal NovoLog, continue sliding scale, monitor and adjust.  CBG (last 3)  Recent Labs    10/25/17 2120 10/26/17 0742 10/26/17 0952  GLUCAP 228* 71 66*   Lab Results  Component Value Date   HGBA1C 8.1 (A) 09/19/2017     DVT Prophylaxis: Prophylactic Heparin   Code Status: Full code   Family Communication: None at bedside  Disposition Plan: Medical bed  Antimicrobial agents: Anti-infectives (From admission, onward)   Start     Dose/Rate Route Frequency Ordered Stop   10/23/17 1000  amoxicillin-clavulanate (AUGMENTIN) 500-125 MG per tablet 500 mg     1 tablet Oral 2 times daily 10/23/17 0917     10/22/17 1130  amoxicillin-clavulanate (AUGMENTIN) 400-57 MG/5ML suspension 400 mg  Status:  Discontinued     400 mg Oral Every 12 hours 10/22/17 1040 10/23/17 0917   10/22/17  1045  amoxicillin-clavulanate (AUGMENTIN) 875-125 MG per tablet 1 tablet  Status:  Discontinued     1 tablet Oral Every 12 hours 10/22/17 1039 10/22/17 1040   10/21/17 0802  gentamicin (GARAMYCIN) 80 mg in sodium chloride 0.9 % 500 mL irrigation  Status:  Discontinued       As needed 10/21/17 0820 10/21/17 0849   10/21/17 0645  gentamicin (GARAMYCIN) 80 mg in sodium chloride 0.9 % 500 mL irrigation   Status:  Discontinued      Irrigation To Surgery 10/21/17 0636 10/22/17 0645   10/19/17 2230  ceFEPIme (MAXIPIME) 1 g in sodium chloride 0.9 % 100 mL IVPB  Status:  Discontinued     1 g 200 mL/hr over 30 Minutes Intravenous Every 24 hours 10/19/17 2217 10/22/17 1039   10/19/17 2215  gentamicin (GARAMYCIN) 80 mg in sodium chloride 0.9 % 500 mL irrigation      Irrigation To Surgery 10/19/17 2200 10/19/17 2325   10/19/17 2215  gentamicin (GARAMYCIN) 80 mg in sodium chloride 0.9 % 500 mL irrigation      Irrigation To Surgery 10/19/17 2202 10/19/17 2324   10/19/17 2215  metroNIDAZOLE (FLAGYL) IVPB 500 mg  Status:  Discontinued     500 mg 100 mL/hr over 60 Minutes Intravenous Every 8 hours 10/19/17 2216 10/22/17 1039   10/18/17 1200  vancomycin (VANCOCIN) IVPB 1000 mg/200 mL premix  Status:  Discontinued     1,000 mg 200 mL/hr over 60 Minutes Intravenous Every 48 hours 10/16/17 1438 10/23/17 0917   10/18/17 1000  doxycycline (VIBRA-TABS) tablet 100 mg  Status:  Discontinued     100 mg Oral Every 12 hours 10/18/17 0933 10/20/17 1043   10/17/17 0900  doxycycline (VIBRAMYCIN) 100 mg in sodium chloride 0.9 % 250 mL IVPB  Status:  Discontinued     100 mg 125 mL/hr over 120 Minutes Intravenous Every 12 hours 10/17/17 0749 10/18/17 0933   10/16/17 2000  ceFEPIme (MAXIPIME) 2 g in sodium chloride 0.9 % 100 mL IVPB     2 g 200 mL/hr over 30 Minutes Intravenous  Once 10/16/17 1937 10/16/17 2200   10/16/17 1500  vancomycin (VANCOCIN) IVPB 1000 mg/200 mL premix     1,000 mg 200 mL/hr over 60 Minutes Intravenous  Once 10/16/17 1356 10/16/17 1654   10/16/17 1245  vancomycin (VANCOCIN) IVPB 1000 mg/200 mL premix     1,000 mg 200 mL/hr over 60 Minutes Intravenous  Once 10/16/17 1239 10/16/17 1457   10/16/17 1245  cefTRIAXone (ROCEPHIN) 2 g in sodium chloride 0.9 % 100 mL IVPB  Status:  Discontinued     2 g 200 mL/hr over 30 Minutes Intravenous Every 24 hours 10/16/17 1239 10/19/17 2222       Procedures: 9/14>> Scrotal exploration +washout, Debridement of devitalized tissue  9/15 - 2nd washout  CONSULTS:  urology  Time spent: 35 minutes-Greater than 50% of this time was spent in counseling, explanation of diagnosis, planning of further management, and coordination of care.  MEDICATIONS: Scheduled Meds: . amLODipine  10 mg Oral Daily  . amoxicillin-clavulanate  1 tablet Oral BID  . buprenorphine-naloxone  1 tablet Sublingual BID  . carvedilol  25 mg Oral BID WC  . cloNIDine  0.3 mg Transdermal Weekly  . collagenase   Topical Daily  . dronabinol  5 mg Oral BID AC  . feeding supplement (PRO-STAT SUGAR FREE 64)  30 mL Oral TID WC  . heparin  5,000 Units Subcutaneous Q8H  . insulin aspart  0-5 Units Subcutaneous QHS  . insulin aspart  0-9 Units Subcutaneous TID WC  . insulin aspart  6 Units Subcutaneous TID WC  . insulin glargine  25 Units Subcutaneous BID  . metoCLOPramide (REGLAN) injection  5 mg Intravenous Q8H  . nystatin ointment   Topical BID  . pantoprazole  40 mg Oral Daily  . sodium bicarbonate  650 mg Oral TID   Continuous Infusions:  PRN Meds:.haloperidol lactate, hydrALAZINE, morphine injection, ondansetron (ZOFRAN) IV, oxyCODONE, promethazine, tiZANidine   PHYSICAL EXAM: Vital signs: Vitals:   10/25/17 0936 10/25/17 1826 10/25/17 2121 10/26/17 0552  BP: (!) 156/82 130/66 (!) 144/85 (!) 148/86  Pulse: 76 70 75 75  Resp:  18 19 19   Temp:  98.7 F (37.1 C) 98.2 F (36.8 C) 98.3 F (36.8 C)  TempSrc:      SpO2: 96% 99% 98% 98%  Weight:      Height:       Filed Weights   10/19/17 2058 10/22/17 0500 10/23/17 0443  Weight: 88.5 kg 91.4 kg 88.9 kg   Body mass index is 24.89 kg/m.   Exam  Awake Alert, Oriented X 3, No new F.N deficits, Normal affect Flushing.AT,PERRAL Supple Neck,No JVD, No cervical lymphadenopathy appriciated.  Symmetrical Chest wall movement, Good air movement bilaterally, CTAB RRR,No Gallops, Rubs, No Parasternal  Heave +ve B.Sounds, Abd Soft, No tenderness, No organomegaly appriciated, No rebound - guarding or rigidity. No Cyanosis, Clubbing or edema, No new Rash or bruise Scrotal area under bandage, he also has a chronic aortic systolic murmur  I have personally reviewed following labs and imaging studies  LABORATORY DATA: CBC: Recent Labs  Lab 10/22/17 0750 10/23/17 0704 10/24/17 0510 10/25/17 0448 10/26/17 0445  WBC 24.7* 21.8* 23.5* 20.3* 17.0*  HGB 8.4* 8.1* 9.2* 7.6* 8.4*  HCT 27.5* 26.9* 30.2* 25.1* 27.0*  MCV 92.3 95.7 93.5 93.7 91.5  PLT 524* 566* 714* 637* 650*    Basic Metabolic Panel: Recent Labs  Lab 10/22/17 0416 10/22/17 0750 10/23/17 0704 10/24/17 0510 10/25/17 0448 10/26/17 0445  NA 138  --  135 137 139 138  K 3.7  --  4.2 4.0 3.5 4.1  CL 113*  --  104 106 110 107  CO2 18*  --  13* 16* 20* 21*  GLUCOSE 118*  --  341* 331* 200* 98  BUN 44*  --  45* 39* 34* 29*  CREATININE 5.20*  --  5.31* 5.18* 4.84* 4.37*  CALCIUM 8.0*  --  8.7* 8.9 8.4* 8.8*  MG  --  1.9 1.8  --   --   --     GFR: Estimated Creatinine Clearance: 28 mL/min (A) (by C-G formula based on SCr of 4.37 mg/dL (H)).  Liver Function Tests: Recent Labs  Lab 10/24/17 0510 10/25/17 0448  AST 11* 11*  ALT 9 8  ALKPHOS 87 75  BILITOT 0.8 0.5  PROT 6.4* 5.5*  ALBUMIN 2.3* 2.0*   No results for input(s): LIPASE, AMYLASE in the last 168 hours. No results for input(s): AMMONIA in the last 168 hours.  Coagulation Profile: No results for input(s): INR, PROTIME in the last 168 hours.  Cardiac Enzymes: No results for input(s): CKTOTAL, CKMB, CKMBINDEX, TROPONINI in the last 168 hours.  BNP (last 3 results) No results for input(s): PROBNP in the last 8760 hours.  HbA1C: No results for input(s): HGBA1C in the last 72 hours.  CBG: Recent Labs  Lab 10/25/17 1631 10/25/17 1704 10/25/17 2120 10/26/17 2683 10/26/17 4196  GLUCAP 51* 82 228* 71 66*    Lipid Profile: No results for  input(s): CHOL, HDL, LDLCALC, TRIG, CHOLHDL, LDLDIRECT in the last 72 hours.  Thyroid Function Tests: No results for input(s): TSH, T4TOTAL, FREET4, T3FREE, THYROIDAB in the last 72 hours.  Anemia Panel: Recent Labs    10/25/17 0750  VITAMINB12 410  FOLATE 12.9  FERRITIN 336  TIBC 167*  IRON 32*  RETICCTPCT 1.7    Urine analysis:    Component Value Date/Time   COLORURINE YELLOW 10/17/2017 0125   APPEARANCEUR HAZY (A) 10/17/2017 0125   LABSPEC 1.014 10/17/2017 0125   PHURINE 5.0 10/17/2017 0125   GLUCOSEU >=500 (A) 10/17/2017 0125   HGBUR MODERATE (A) 10/17/2017 0125   BILIRUBINUR NEGATIVE 10/17/2017 0125   KETONESUR 20 (A) 10/17/2017 0125   PROTEINUR 100 (A) 10/17/2017 0125   UROBILINOGEN 1.0 11/13/2014 0155   NITRITE NEGATIVE 10/17/2017 0125   LEUKOCYTESUR NEGATIVE 10/17/2017 0125    Sepsis Labs: Lactic Acid, Venous    Component Value Date/Time   LATICACIDVEN 1.58 10/16/2017 1318    MICROBIOLOGY: Recent Results (from the past 240 hour(s))  Blood Culture (routine x 2)     Status: None   Collection Time: 10/16/17 12:50 PM  Result Value Ref Range Status   Specimen Description BLOOD LEFT ANTECUBITAL  Final   Special Requests   Final    BOTTLES DRAWN AEROBIC AND ANAEROBIC Blood Culture results may not be optimal due to an inadequate volume of blood received in culture bottles   Culture   Final    NO GROWTH 5 DAYS Performed at [facility], [address] [place], [place] [address]    Report Status 10/21/2017 FINAL  Final  Blood Culture (routine x 2)     Status: None   Collection Time: 10/16/17  9:07 PM  Result Value Ref Range Status   Specimen Description LEFT ANTECUBITAL  Final   Special Requests   Final    BOTTLES DRAWN AEROBIC AND ANAEROBIC Blood Culture adequate volume   Culture   Final    NO GROWTH 5 DAYS Performed at [facility], [address], [place], [place] [address]    Report Status 10/21/2017 FINAL  Final  MRSA PCR Screening     Status: None    Collection Time: 10/17/17  4:37 AM  Result Value Ref Range Status   MRSA by PCR NEGATIVE NEGATIVE Final    Comment:        The GeneXpert MRSA Assay (FDA approved for NASAL specimens only), is one component of a comprehensive MRSA colonization surveillance program. It is not intended to diagnose MRSA infection nor to guide or monitor treatment for MRSA infections. Performed at [facility], [address], [place], [place] [address]   Culture, blood (routine x 2)     Status: None   Collection Time: 10/18/17  6:05 PM  Result Value Ref Range Status   Specimen Description BLOOD LEFT HAND  Final   Special Requests   Final    BOTTLES DRAWN AEROBIC AND ANAEROBIC Blood Culture adequate volume   Culture   Final    NO GROWTH 5 DAYS Performed at [facility], [address], [place], [place] [address]    Report Status 10/23/2017 FINAL  Final  Culture, blood (routine x 2)     Status: None   Collection Time: 10/18/17  6:09 PM  Result Value Ref Range Status   Specimen Description BLOOD LEFT HAND  Final   Special Requests   Final  BOTTLES DRAWN AEROBIC AND ANAEROBIC Blood Culture adequate volume   Culture   Final    NO GROWTH 5 DAYS Performed at [facility], [address], [place], [place] [address]    Report Status 10/23/2017 FINAL  Final  Aerobic/Anaerobic Culture (surgical/deep wound)     Status: None   Collection Time: 10/19/17 11:27 PM  Result Value Ref Range Status   Specimen Description ABSCESS SCROTUM  Final   Special Requests PATIENT ON FOLLOWING VANC DOXYCYCLINE CEFEPIME  Final   Gram Stain   Final    ABUNDANT WBC PRESENT, PREDOMINANTLY PMN ABUNDANT GRAM NEGATIVE RODS ABUNDANT GRAM POSITIVE COCCI Performed at [facility], [address] [place], [place] [address]    Culture   Final    MODERATE ENTEROCOCCUS AVIUM MIXED ANAEROBIC FLORA PRESENT.  CALL LAB IF FURTHER IID REQUIRED.    Report Status 10/23/2017 FINAL  Final   Organism ID, Bacteria ENTEROCOCCUS  AVIUM  Final      Susceptibility   Enterococcus avium - MIC*    AMPICILLIN <=2 SENSITIVE Sensitive     VANCOMYCIN <=0.5 SENSITIVE Sensitive     GENTAMICIN SYNERGY SENSITIVE Sensitive     * MODERATE ENTEROCOCCUS AVIUM  Surgical pcr screen     Status: Abnormal   Collection Time: 10/20/17 10:21 PM  Result Value Ref Range Status   MRSA, PCR NEGATIVE NEGATIVE Final   Staphylococcus aureus POSITIVE (A) NEGATIVE Final    Comment: RESULT CALLED TO, READ BACK BY AND VERIFIED WITH: RN Orvan Seen [phone] @0151  THANEY Performed at [facility] [address], [place],  [address]     RADIOLOGY STUDIES/RESULTS: Ct Abdomen Pelvis Wo Contrast  Result Date: 10/16/2017 CLINICAL DATA:  Four-day history of pain, erythema and swelling involving the RIGHT hemiscrotum, associated with fever. Current history of end-stage renal disease for which the patient had a an AV fistula placed on 04/09/2017. Surgical history includes cholecystectomy. EXAM: CT ABDOMEN AND PELVIS WITHOUT CONTRAST TECHNIQUE: Multidetector CT imaging of the abdomen and pelvis was performed following the standard protocol without IV contrast. COMPARISON:  07/10/2017, 05/29/2017 and earlier. FINDINGS: Lower chest: Minimal linear scar/atelectasis in the lingula and both lower lobes. Visualized lung bases otherwise clear. Resolution of RIGHT LOWER LOBE pneumonia since 07/10/2017. Heart mildly enlarged with RIGHT coronary artery atherosclerosis. Hepatobiliary: Normal unenhanced appearance of the liver. Surgically absent gallbladder. No unexpected biliary ductal dilation. Pancreas: Normal unenhanced appearance. Spleen: Normal unenhanced appearance. Adrenals/Urinary Tract: Normal appearing adrenal glands. No evidence of urinary tract calculi or obstruction on either side. Within the limits of the unenhanced technique, no focal parenchymal abnormality involving either kidney. Normal appearing urinary bladder. Stomach/Bowel: Thickening of the  wall of the distal esophagus to the level of the EG junction as noted previously. Normal appearing decompressed stomach. Normal-appearing small bowel. Normal-appearing colon with expected stool burden. Normal appendix in the RIGHT UPPER pelvis. Vascular/Lymphatic: Moderate aorto-iliofemoral atherosclerosis without evidence of aneurysm. Enlarged LEFT periaortic retroperitoneal lymph nodes, the largest node measuring approximately 2.3 x 2.1 cm (series 2, image 34), increased in size since the most recent prior CT 07/10/2017 where it measured 1.8 x 1.7 cm at the same level. Multiple upper normal size to slightly enlarged BILATERAL superficial inguinal lymph nodes, increased in size since the most recent prior CT. Reproductive: Prostate gland and seminal vesicles normal in size and appearance for age. Extensive edema/inflammation involving the RIGHT hemiscrotum with marked skin thickening and a likely fluid collection within the thickened skin measuring approximately 1.6 cm (  series 3, image 18). Mild edema/inflammation involving the contralateral LEFT hemiscrotum. Large BILATERAL hydroceles. No evidence of gas within the scrotal tissues. Other: None. Musculoskeletal: Large Schmorl's node in the UPPER endplate of P00 with mild compression deformity of the UPPER endplate of F11, unchanged. No new/acute findings. IMPRESSION: 1. Edema/inflammation involving the RIGHT hemiscrotum with marked skin thickening and a likely abscess within the thickened skin. Edema/inflammation to a lesser degree involves the contralateral LEFT hemiscrotum. Large BILATERAL hydroceles are present. No evidence of gas within the scrotal tissues to suggest Fournier's gangrene. Scrotal ultrasound is recommended in further evaluation to better delineate these findings. 2. Enlarging LEFT periaortic lymph nodes and BILATERAL superficial inguinal lymph nodes since the most recent prior CT 3 months ago. While these are statistically likely reactive, a  low-grade lymphoproliferative disorder could have a similar appearance. 3. Stable marked thickening of the wall of the visualized distal esophagus to the level of the EG junction dating back to at least 2017, likely chronic esophagitis. 4.  Aortic Atherosclerosis, advanced for patient age.  (ICD10-170.0) Electronically Signed   By: Evangeline Dakin M.D.   On: 10/16/2017 15:10   US Scrotum W/doppler  Result Date: 10/16/2017 CLINICAL DATA:  Right-sided pain with edema and redness over the last 4 days EXAM: SCROTAL ULTRASOUND DOPPLER ULTRASOUND OF THE TESTICLES TECHNIQUE: Complete ultrasound examination of the testicles, epididymis, and other scrotal structures was performed. Color and spectral Doppler ultrasound were also utilized to evaluate blood flow to the testicles. COMPARISON:  CT abdomen pelvis of 10/16/2016 FINDINGS: Right testicle Measurements: The right testicle measures 3.9 x 2.4 x 2.6 cm. No intratesticular abnormality is seen. There is blood flow to the right testicle with arterial and venous waveforms. Left testicle Measurements: The left testicle measures 4.2 x 2.7 x 2.6 cm. No intratesticular abnormality is seen. Blood flow is demonstrated to the left testicle with arterial and venous waveforms. Right epididymis:  The right epididymis is unremarkable. Left epididymis: The left epididymis appears enlarged and inhomogeneous as well as hypervascular suggesting left epididymitis. Hydrocele: There is a small right hydrocele present which appears partially septated. Varicocele:  No varicocele is seen. Pulsed Doppler interrogation of both testes demonstrates normal low resistance arterial and venous waveforms bilaterally. There is diffuse scrotal edema and skin thickening present. No discrete abscess is detected. IMPRESSION: 1. Diffusely edematous scrotal wall.  No abscess. 2. Irregular and enlarged left epididymis with hypervascularity suggestive of left epididymitis. 3. No intratesticular abnormality is  seen. Blood flow is demonstrated to both testicles with arterial and venous waveforms. 4. Small right hydrocele which appears septated. Electronically Signed   By: Ivar Drape M.D.   On: 10/16/2017 16:22     LOS: 10 days   Lala Lund, MD  [facility]  If 7PM-7AM, please contact night-coverage  Please page via [email or web address]-click on MD name and type text message  10/26/2017, 10:06 AM

## 2017-10-26 NOTE — Consult Note (Signed)
Urology Progress Note   S/p scrotal exploration, debridement and washout 10/20/17  S/p 2nd look washout and primary closure 10/21/17  Subjective: No acute events overnight. WBC is down to 17 this morning The patient's pain is reasonably well controlled. The patient is afebrile with stable vital signs Patient tolerated dressing change last night and allow dressing to remain in place  Objective: Vital signs in last 24 hours: Temp:  [98.2 F (36.8 C)-98.7 F (37.1 C)] 98.3 F (36.8 C) (09/20 0552) Pulse Rate:  [70-76] 75 (09/20 0552) Resp:  [18-19] 19 (09/20 0552) BP: (130-156)/(66-86) 148/86 (09/20 0552) SpO2:  [96 %-99 %] 98 % (09/20 0552)  Intake/Output from previous day: 09/19 0701 - 09/20 0700 In: 580 [P.O.:580] Out: 2550 [Urine:2550] Intake/Output this shift: No intake/output data recorded.  Physical Exam:  General: alert if redirected but drowsy, able to answer questions appropriately intermittently CV: RRR Lungs: Clear Abdomen: Soft, appropriately tender.  GU:  Scrotal wound loosely closed with nylon stitches.  Penrose drains have been removed.  There is fibrinous exudate along the suture line. There is not expression of purulent drainage. Overlying erythema and tenderness is improving, as well as edema. Suprapubic region and perineum still look okay. Still some dusky areas of skin edges but not worsening, may continue to slough, but majority of closure appears okay.  Mild improvement in exudate burden in last 24 hours. Wound appears stable and slightly improving.  Ext: NT, No erythema  Lab Results: Recent Labs    10/24/17 0510 10/25/17 0448 10/26/17 0445  HGB 9.2* 7.6* 8.4*  HCT 30.2* 25.1* 27.0*   BMET Recent Labs    10/25/17 0448 10/26/17 0445  NA 139 138  K 3.5 4.1  CL 110 107  CO2 20* 21*  GLUCOSE 200* 98  BUN 34* 29*  CREATININE 4.84* 4.37*  CALCIUM 8.4* 8.8*     Studies/Results: No results found.  Assessment/Plan:  32 y.o. male w/  uncontrolled T1DM, developed Fournier's gangrene. S/p initial debridemennt on 10/20/17. S/p 2nd look washout and loose primary closure with drains on 10/21/17.  The wound cultures are growing enterococcus.  He allowed nursing to apply dressing and left in place overnight which is an improvement. Wound was examined again this morning and seems to show improvement with dressing debridement. Less fibrinous exduate along suture line. Inside of wound near right testis looks healthy with appearance of new granulation tissue. Overlying skin remains erythematous but non worsening. Further, WBC is down to 17 which is lowest it has been. Again, I heavily emphasized the importance of routine thorough dressing changes to allow his best shot at non-operative debridement. His exam seems improving for now and we can likely continue this through the weekend.   -Urology will continue to follow along with daily exams.    LOS: 10 days   Fredricka Bonine 10/26/2017, 7:52 AM

## 2017-10-26 NOTE — Progress Notes (Signed)
Pt CBG 47 at 1700. Pt asymptomatic, alert taking po without difficulty.  Repeat CBG 53.  Call placed to notify MD.  See new orders.  New orders discussed during hand off.  AKingRN

## 2017-10-26 NOTE — Progress Notes (Signed)
Inpatient Diabetes Program Recommendations  AACE/ADA: New Consensus Statement on Inpatient Glycemic Control (2015)  Target Ranges:  Prepandial:   less than 140 mg/dL      Peak postprandial:   less than 180 mg/dL (1-2 hours)      Critically ill patients:  140 - 180 mg/dL   Lab Results  Component Value Date   GLUCAP 123 (H) 10/26/2017   HGBA1C 8.1 (A) 09/19/2017    Review of Glycemic ControlResults for [name], [name] (MRN [id number]) as of 10/26/2017 13:26  Ref. Range 10/25/2017 17:04 10/25/2017 21:20 10/26/2017 07:42 10/26/2017 09:52 10/26/2017 12:03  Glucose-Capillary Latest Ref Range: 70 - 99 mg/dL 82 228 (H) 71 66 (L) 123 (H)   Diabetes history: Type 2 DM  Outpatient Diabetes medications: Basaglar 6 units bid, Humalog 1-15 units tid with meals Current orders for Inpatient glycemic control:  Novolog sensitive tid with meals and HS, Lantus 25 units bid, Novolog 2 units tid with meals Inpatient Diabetes Program Recommendations:  Note low blood sugars.  May consider reducing Lantus to 20 units bid.   Thanks,  Adah Perl, RN, BC-ADM Inpatient Diabetes Coordinator Pager [phone] (8a-5p)

## 2017-10-27 LAB — GLUCOSE, CAPILLARY
GLUCOSE-CAPILLARY: 57 mg/dL — AB (ref 70–99)
Glucose-Capillary: 79 mg/dL (ref 70–99)
Glucose-Capillary: 71 mg/dL (ref 70–99)
Glucose-Capillary: 98 mg/dL (ref 70–99)
Glucose-Capillary: 195 mg/dL — ABNORMAL HIGH (ref 70–99)
Glucose-Capillary: 157 mg/dL — ABNORMAL HIGH (ref 70–99)

## 2017-10-27 LAB — BASIC METABOLIC PANEL
ANION GAP: 9 (ref 5–15)
BUN: 27 mg/dL — ABNORMAL HIGH (ref 6–20)
CALCIUM: 8.8 mg/dL — AB (ref 8.9–10.3)
CO2: 22 mmol/L (ref 22–32)
Chloride: 108 mmol/L (ref 98–111)
Creatinine, Ser: 4.42 mg/dL — ABNORMAL HIGH (ref 0.61–1.24)
GFR, EST AFRICAN AMERICAN: 19 mL/min — AB (ref >60)
GFR, EST NON AFRICAN AMERICAN: 16 mL/min — AB (ref >60)
GLUCOSE: 73 mg/dL (ref 70–99)
POTASSIUM: 4.1 mmol/L (ref 3.5–5.1)
Sodium: 139 mmol/L (ref 135–145)

## 2017-10-27 LAB — CBC
HEMATOCRIT: 27.3 % — AB (ref 39.0–52.0)
Hemoglobin: 8.5 g/dL — ABNORMAL LOW (ref 13.0–17.0)
MCH: 28.4 pg (ref 26.0–34.0)
MCHC: 31.1 g/dL (ref 30.0–36.0)
MCV: 91.3 fL (ref 78.0–100.0)
PLATELETS: 627 10*3/uL — AB (ref 150–400)
RBC: 2.99 MIL/uL — AB (ref 4.22–5.81)
RDW: 16.1 % — AB (ref 11.5–15.5)
WBC: 15.7 10*3/uL — AB (ref 4.0–10.5)

## 2017-10-27 LAB — MAGNESIUM: Magnesium: 1.8 mg/dL (ref 1.7–2.4)

## 2017-10-27 MED ORDER — INSULIN GLARGINE 100 UNIT/ML ~~LOC~~ SOLN
20.0000 [IU] | Freq: Every day | SUBCUTANEOUS | Status: DC
  Filled 2017-10-27 (×2): qty 0.2

## 2017-10-27 NOTE — Progress Notes (Signed)
6 Days Post-Op Subjective: Patient threatening to leave. No current c/o's  Objective: Vital signs in last 24 hours: Temp:  [97.8 F (36.6 C)-98.8 F (37.1 C)] 98.6 F (37 C) (09/21 0621) Pulse Rate:  [78-83] 83 (09/21 0621) Resp:  [18-19] 19 (09/21 0621) BP: (136-163)/(69-85) 163/85 (09/21 0621) SpO2:  [96 %-99 %] 97 % (09/21 0621)  Intake/Output from previous day: 09/20 0701 - 09/21 0700 In: 290 [P.O.:240; IV Piggyback:50] Out: 500 [Urine:500] Intake/Output this shift: No intake/output data recorded.  Physical Exam:  Constitutional: Vital signs reviewed. WD WN in NAD   Eyes: PERRL, No scleral icterus.   Cardiovascular: RRR Pulmonary/Chest: Normal effort Genitourinary: Scrotum w/o erythema, no purulent drainage. Wound w/ fibrinous appearance. Clean edges.   Lab Results: Recent Labs    10/25/17 0448 10/26/17 0445 10/27/17 0604  HGB 7.6* 8.4* 8.5*  HCT 25.1* 27.0* 27.3*   BMET Recent Labs    10/26/17 0445 10/27/17 0604  NA 138 139  K 4.1 4.1  CL 107 108  CO2 21* 22  GLUCOSE 98 73  BUN 29* 27*  CREATININE 4.37* 4.42*  CALCIUM 8.8* 8.8*   No results for input(s): LABPT, INR in the last 72 hours. No results for input(s): LABURIN in the last 72 hours. Results for orders placed or performed during the hospital encounter of 10/16/17  Blood Culture (routine x 2)     Status: None   Collection Time: 10/16/17 12:50 PM  Result Value Ref Range Status   Specimen Description BLOOD LEFT ANTECUBITAL  Final   Special Requests   Final    BOTTLES DRAWN AEROBIC AND ANAEROBIC Blood Culture results may not be optimal due to an inadequate volume of blood received in culture bottles   Culture   Final    NO GROWTH 5 DAYS Performed at [facility], [address] [place], [place] [address]    Report Status 10/21/2017 FINAL  Final  Blood Culture (routine x 2)     Status: None   Collection Time: 10/16/17  9:07 PM  Result Value Ref Range Status   Specimen Description LEFT  ANTECUBITAL  Final   Special Requests   Final    BOTTLES DRAWN AEROBIC AND ANAEROBIC Blood Culture adequate volume   Culture   Final    NO GROWTH 5 DAYS Performed at [facility], [address], [place], [place] [address]    Report Status 10/21/2017 FINAL  Final  MRSA PCR Screening     Status: None   Collection Time: 10/17/17  4:37 AM  Result Value Ref Range Status   MRSA by PCR NEGATIVE NEGATIVE Final    Comment:        The GeneXpert MRSA Assay (FDA approved for NASAL specimens only), is one component of a comprehensive MRSA colonization surveillance program. It is not intended to diagnose MRSA infection nor to guide or monitor treatment for MRSA infections. Performed at [facility], [address], [place], [place] [address]   Culture, blood (routine x 2)     Status: None   Collection Time: 10/18/17  6:05 PM  Result Value Ref Range Status   Specimen Description BLOOD LEFT HAND  Final   Special Requests   Final    BOTTLES DRAWN AEROBIC AND ANAEROBIC Blood Culture adequate volume   Culture   Final    NO GROWTH 5 DAYS Performed at [facility], [address] [place], [place] [address]    Report Status 10/23/2017 FINAL  Final  Culture, blood (routine x  2)     Status: None   Collection Time: 10/18/17  6:09 PM  Result Value Ref Range Status   Specimen Description BLOOD LEFT HAND  Final   Special Requests   Final    BOTTLES DRAWN AEROBIC AND ANAEROBIC Blood Culture adequate volume   Culture   Final    NO GROWTH 5 DAYS Performed at [facility], [address], [place], [place] [address]    Report Status 10/23/2017 FINAL  Final  Aerobic/Anaerobic Culture (surgical/deep wound)     Status: None   Collection Time: 10/19/17 11:27 PM  Result Value Ref Range Status   Specimen Description ABSCESS SCROTUM  Final   Special Requests PATIENT ON FOLLOWING VANC DOXYCYCLINE CEFEPIME  Final   Gram Stain   Final    ABUNDANT WBC PRESENT, PREDOMINANTLY PMN ABUNDANT GRAM NEGATIVE  RODS ABUNDANT GRAM POSITIVE COCCI Performed at [facility], [place] [address], [place], [place] [address]    Culture   Final    MODERATE ENTEROCOCCUS AVIUM MIXED ANAEROBIC FLORA PRESENT.  CALL LAB IF FURTHER IID REQUIRED.    Report Status 10/23/2017 FINAL  Final   Organism ID, Bacteria ENTEROCOCCUS AVIUM  Final      Susceptibility   Enterococcus avium - MIC*    AMPICILLIN <=2 SENSITIVE Sensitive     VANCOMYCIN <=0.5 SENSITIVE Sensitive     GENTAMICIN SYNERGY SENSITIVE Sensitive     * MODERATE ENTEROCOCCUS AVIUM  Surgical pcr screen     Status: Abnormal   Collection Time: 10/20/17 10:21 PM  Result Value Ref Range Status   MRSA, PCR NEGATIVE NEGATIVE Final   Staphylococcus aureus POSITIVE (A) NEGATIVE Final    Comment: RESULT CALLED TO, READ BACK BY AND VERIFIED WITH: RN DYolanda Manges [phone] @0151  THANEY Performed at [facility] [address] [place], [place] [address]     Studies/Results: No results found.  Assessment/Plan:   1 week out from scrotal debridement w/ repeat procedure for Fourniere's gangrene. Except for noncompliance doing well with improving wound appearance.  I think I convinced him to stay for wound care.   Will continue to follow   LOS: 11 days   Jorja Loa 10/27/2017, 9:56 AM

## 2017-10-27 NOTE — Progress Notes (Signed)
Patient alert and oriented x 4, continues to complain of pain to groin. Patient noted removing dressing to perineum, educated on rationale for dressing, patient states "I don't care, this thing is bothersome".  Several rounds of emesis noted early this arm. Relieved by antemetic.  Glucose monitored, low but within normal parameters.  Stable condition at shift change, will continue to monitor.

## 2017-10-27 NOTE — Plan of Care (Signed)

## 2017-10-27 NOTE — Progress Notes (Signed)
PROGRESS NOTE        PATIENT DETAILS Name: Brent Daniels Age: 32 y.o. Sex: male Date of Birth: 03-26-1985 Admit Date: 10/16/2017 Admitting Physician Jani Gravel, MD VOH:YWVP, Costella Hatcher, MD  Brief Narrative: Patient is a 32 y.o. male with prior history of DM-1, gastroparesis, CKD stage V-admitted to [facility] on 9/10 sepsis secondary to scrotal cellulitis, AKI on CKD stage V-patient was started on broad-spectrum IV antimicrobial therapy, IV fluids and other supportive care-with these measures, patient's renal function slowly improved, however he developed Fournier's gangrene, patient was transferred to [facility] on 9/13 and underwent scrotal exploration and debridement on 9/14.  See below for further details.  Subjective: Patient in bed, appears comfortable, denies any headache, no fever, no chest pain or pressure, no shortness of breath , no abdominal pain. No focal weakness.  Wanted to be discharged home but counseled to stay back.  Assessment/Plan:  Sepsis secondary to scrotal cellulitis with progression to Fournier's gangrene: Sepsis pathophysiology has resolved, he was treated empirically with IV vancomycin wound cultures seem to be growing pansensitive enterococcus, he was switched to oral Augmentin on 10/23/2017 after discussions with ID Dr. Johnnye Sima, urology is on board and he is status post 2 scrotal washouts on 10/20/2013 and 10/21/2013 respectively, he had a retained Penrose drain in his scrotum but unfortunately patient pulled it out on 10/22/2017, may require another washout.  As per the staff he is not allowing dressing changes as scheduled, patient was counseled.  He still having significant pain and discomfort may require another washout but will defer the decision to urologist.   DW Dr. Kary Kos on 10/26/2017 at 10 AM and Dr. Diona Fanti on 10/27/2017.    ARF on CKD stage V: Plan creatinine 4.7, ARF has resolved and he is now close to his baseline.  Continue to  monitor.  Hypertension: Blood pressure seems to be reasonably stable with clonidine, amlodipine, will switch IV metoprolol to back to Coreg.  Follow.  Intractable vomiting: Likely due to exacerbation of his gastroparesis, resolved with Reglan and supportive care.  Normocytic anemia: Anemia panel is noncontributory, B12 levels and folate levels are stable, this likely is anemia of chronic disease, type screen done will monitor.  Opioid dependence: Continue Suboxone.  Per prior note from Dr. Richrd Sox was last filled on 9/9-1-week supply.  Tobacco abuse: Counseled.  Poor appetite.  Added Megace along with Marinol and prostat.    DM 1: Very labile sugars due to inconsistent oral intake, have dropped Lantus dose on 10/27/2017 and stopped nighttime NovoLog coverage, will monitor closely and adjust as needed.  CBG (last 3)  Recent Labs    10/26/17 2204 10/27/17 0223 10/27/17 0752  GLUCAP 101* 79 71   Lab Results  Component Value Date   HGBA1C 8.1 (A) 09/19/2017     DVT Prophylaxis: Prophylactic Heparin   Code Status: Full code   Family Communication: None at bedside  Disposition Plan: Medical bed  Antimicrobial agents: Anti-infectives (From admission, onward)   Start     Dose/Rate Route Frequency Ordered Stop   10/23/17 1000  amoxicillin-clavulanate (AUGMENTIN) 500-125 MG per tablet 500 mg     1 tablet Oral 2 times daily 10/23/17 0917     10/22/17 1130  amoxicillin-clavulanate (AUGMENTIN) 400-57 MG/5ML suspension 400 mg  Status:  Discontinued     400 mg Oral Every 12 hours  10/22/17 1040 10/23/17 0917   10/22/17 1045  amoxicillin-clavulanate (AUGMENTIN) 875-125 MG per tablet 1 tablet  Status:  Discontinued     1 tablet Oral Every 12 hours 10/22/17 1039 10/22/17 1040   10/21/17 0802  gentamicin (GARAMYCIN) 80 mg in sodium chloride 0.9 % 500 mL irrigation  Status:  Discontinued       As needed 10/21/17 0820 10/21/17 0849   10/21/17 0645  gentamicin (GARAMYCIN) 80 mg in  sodium chloride 0.9 % 500 mL irrigation  Status:  Discontinued      Irrigation To Surgery 10/21/17 0636 10/22/17 0645   10/19/17 2230  ceFEPIme (MAXIPIME) 1 g in sodium chloride 0.9 % 100 mL IVPB  Status:  Discontinued     1 g 200 mL/hr over 30 Minutes Intravenous Every 24 hours 10/19/17 2217 10/22/17 1039   10/19/17 2215  gentamicin (GARAMYCIN) 80 mg in sodium chloride 0.9 % 500 mL irrigation      Irrigation To Surgery 10/19/17 2200 10/19/17 2325   10/19/17 2215  gentamicin (GARAMYCIN) 80 mg in sodium chloride 0.9 % 500 mL irrigation      Irrigation To Surgery 10/19/17 2202 10/19/17 2324   10/19/17 2215  metroNIDAZOLE (FLAGYL) IVPB 500 mg  Status:  Discontinued     500 mg 100 mL/hr over 60 Minutes Intravenous Every 8 hours 10/19/17 2216 10/22/17 1039   10/18/17 1200  vancomycin (VANCOCIN) IVPB 1000 mg/200 mL premix  Status:  Discontinued     1,000 mg 200 mL/hr over 60 Minutes Intravenous Every 48 hours 10/16/17 1438 10/23/17 0917   10/18/17 1000  doxycycline (VIBRA-TABS) tablet 100 mg  Status:  Discontinued     100 mg Oral Every 12 hours 10/18/17 0933 10/20/17 1043   10/17/17 0900  doxycycline (VIBRAMYCIN) 100 mg in sodium chloride 0.9 % 250 mL IVPB  Status:  Discontinued     100 mg 125 mL/hr over 120 Minutes Intravenous Every 12 hours 10/17/17 0749 10/18/17 0933   10/16/17 2000  ceFEPIme (MAXIPIME) 2 g in sodium chloride 0.9 % 100 mL IVPB     2 g 200 mL/hr over 30 Minutes Intravenous  Once 10/16/17 1937 10/16/17 2200   10/16/17 1500  vancomycin (VANCOCIN) IVPB 1000 mg/200 mL premix     1,000 mg 200 mL/hr over 60 Minutes Intravenous  Once 10/16/17 1356 10/16/17 1654   10/16/17 1245  vancomycin (VANCOCIN) IVPB 1000 mg/200 mL premix     1,000 mg 200 mL/hr over 60 Minutes Intravenous  Once 10/16/17 1239 10/16/17 1457   10/16/17 1245  cefTRIAXone (ROCEPHIN) 2 g in sodium chloride 0.9 % 100 mL IVPB  Status:  Discontinued     2 g 200 mL/hr over 30 Minutes Intravenous Every 24 hours 10/16/17  1239 10/19/17 2222      Procedures: 9/14>> Scrotal exploration +washout, Debridement of devitalized tissue  9/15 - 2nd washout  CONSULTS:  urology  Time spent: 35 minutes-Greater than 50% of this time was spent in counseling, explanation of diagnosis, planning of further management, and coordination of care.  MEDICATIONS: Scheduled Meds: . amLODipine  10 mg Oral Daily  . amoxicillin-clavulanate  1 tablet Oral BID  . buprenorphine-naloxone  1 tablet Sublingual BID  . carvedilol  25 mg Oral BID WC  . cloNIDine  0.3 mg Transdermal Weekly  . collagenase   Topical Daily  . dextrose  1 ampule Intravenous Once  . dronabinol  5 mg Oral BID AC  . feeding supplement (PRO-STAT SUGAR FREE 64)  30 mL Oral  TID WC  . heparin  5,000 Units Subcutaneous Q8H  . insulin aspart  0-9 Units Subcutaneous TID WC  . [START ON 10/28/2017] insulin glargine  20 Units Subcutaneous Daily  . metoCLOPramide (REGLAN) injection  5 mg Intravenous Q8H  . nystatin ointment   Topical BID  . pantoprazole  40 mg Oral Daily  . sodium bicarbonate  650 mg Oral TID   Continuous Infusions:  PRN Meds:.dextrose, haloperidol lactate, hydrALAZINE, morphine injection, ondansetron (ZOFRAN) IV, oxyCODONE, promethazine, tiZANidine   PHYSICAL EXAM: Vital signs: Vitals:   10/26/17 0552 10/26/17 1545 10/26/17 2016 10/27/17 0621  BP: (!) 148/86 (!) 148/85 136/69 (!) 163/85  Pulse: 75  78 83  Resp: 19  18 19   Temp: 98.3 F (36.8 C) 97.8 F (36.6 C) 98.8 F (37.1 C) 98.6 F (37 C)  TempSrc:  Axillary Oral Oral  SpO2: 98% 96% 99% 97%  Weight:      Height:       Filed Weights   10/19/17 2058 10/22/17 0500 10/23/17 0443  Weight: 88.5 kg 91.4 kg 88.9 kg   Body mass index is 24.89 kg/m.   Exam  Awake Alert, Oriented X 3, No new F.N deficits, Normal affect [place].AT,PERRAL Supple Neck,No JVD, No cervical lymphadenopathy appriciated.  Symmetrical Chest wall movement, Good air movement bilaterally, CTAB RRR,No Gallops,  Rubs , No Parasternal Heave +ve B.Sounds, Abd Soft, No tenderness, No organomegaly appriciated, No rebound - guarding or rigidity. No Cyanosis, Clubbing or edema, No new Rash or bruise Scrotal area under bandage, he also has a chronic aortic systolic murmur  I have personally reviewed following labs and imaging studies  LABORATORY DATA: CBC: Recent Labs  Lab 10/23/17 0704 10/24/17 0510 10/25/17 0448 10/26/17 0445 10/27/17 0604  WBC 21.8* 23.5* 20.3* 17.0* 15.7*  HGB 8.1* 9.2* 7.6* 8.4* 8.5*  HCT 26.9* 30.2* 25.1* 27.0* 27.3*  MCV 95.7 93.5 93.7 91.5 91.3  PLT 566* 714* 637* 650* 627*    Basic Metabolic Panel: Recent Labs  Lab 10/22/17 0750 10/23/17 0704 10/24/17 0510 10/25/17 0448 10/26/17 0445 10/27/17 0604  NA  --  135 137 139 138 139  K  --  4.2 4.0 3.5 4.1 4.1  CL  --  104 106 110 107 108  CO2  --  13* 16* 20* 21* 22  GLUCOSE  --  341* 331* 200* 98 73  BUN  --  45* 39* 34* 29* 27*  CREATININE  --  5.31* 5.18* 4.84* 4.37* 4.42*  CALCIUM  --  8.7* 8.9 8.4* 8.8* 8.8*  MG 1.9 1.8  --   --   --  1.8    GFR: Estimated Creatinine Clearance: 27.7 mL/min (A) (by C-G formula based on SCr of 4.42 mg/dL (H)).  Liver Function Tests: Recent Labs  Lab 10/24/17 0510 10/25/17 0448  AST 11* 11*  ALT 9 8  ALKPHOS 87 75  BILITOT 0.8 0.5  PROT 6.4* 5.5*  ALBUMIN 2.3* 2.0*   No results for input(s): LIPASE, AMYLASE in the last 168 hours. No results for input(s): AMMONIA in the last 168 hours.  Coagulation Profile: No results for input(s): INR, PROTIME in the last 168 hours.  Cardiac Enzymes: No results for input(s): CKTOTAL, CKMB, CKMBINDEX, TROPONINI in the last 168 hours.  BNP (last 3 results) No results for input(s): PROBNP in the last 8760 hours.  HbA1C: No results for input(s): HGBA1C in the last 72 hours.  CBG: Recent Labs  Lab 10/26/17 1701 10/26/17 1826 10/26/17 2204 10/27/17 9892  10/27/17 0752  GLUCAP 47* 53* 101* 79 71    Lipid Profile: No  results for input(s): CHOL, HDL, LDLCALC, TRIG, CHOLHDL, LDLDIRECT in the last 72 hours.  Thyroid Function Tests: No results for input(s): TSH, T4TOTAL, FREET4, T3FREE, THYROIDAB in the last 72 hours.  Anemia Panel: Recent Labs    10/25/17 0750  VITAMINB12 410  FOLATE 12.9  FERRITIN 336  TIBC 167*  IRON 32*  RETICCTPCT 1.7    Urine analysis:    Component Value Date/Time   COLORURINE YELLOW 10/17/2017 0125   APPEARANCEUR HAZY (A) 10/17/2017 0125   LABSPEC 1.014 10/17/2017 0125   PHURINE 5.0 10/17/2017 0125   GLUCOSEU >=500 (A) 10/17/2017 0125   HGBUR MODERATE (A) 10/17/2017 0125   BILIRUBINUR NEGATIVE 10/17/2017 0125   KETONESUR 20 (A) 10/17/2017 0125   PROTEINUR 100 (A) 10/17/2017 0125   UROBILINOGEN 1.0 11/13/2014 0155   NITRITE NEGATIVE 10/17/2017 0125   LEUKOCYTESUR NEGATIVE 10/17/2017 0125    Sepsis Labs: Lactic Acid, Venous    Component Value Date/Time   LATICACIDVEN 1.58 10/16/2017 1318    MICROBIOLOGY: Recent Results (from the past 240 hour(s))  Culture, blood (routine x 2)     Status: None   Collection Time: 10/18/17  6:05 PM  Result Value Ref Range Status   Specimen Description BLOOD LEFT HAND  Final   Special Requests   Final    BOTTLES DRAWN AEROBIC AND ANAEROBIC Blood Culture adequate volume   Culture   Final    NO GROWTH 5 DAYS Performed at [facility], [address], [place], [place] [address]    Report Status 10/23/2017 FINAL  Final  Culture, blood (routine x 2)     Status: None   Collection Time: 10/18/17  6:09 PM  Result Value Ref Range Status   Specimen Description BLOOD LEFT HAND  Final   Special Requests   Final    BOTTLES DRAWN AEROBIC AND ANAEROBIC Blood Culture adequate volume   Culture   Final    NO GROWTH 5 DAYS Performed at [facility], [address] [place], [place] [address]    Report Status 10/23/2017 FINAL  Final  Aerobic/Anaerobic Culture (surgical/deep wound)     Status: None   Collection Time: 10/19/17 11:27 PM    Result Value Ref Range Status   Specimen Description ABSCESS SCROTUM  Final   Special Requests PATIENT ON FOLLOWING VANC DOXYCYCLINE CEFEPIME  Final   Gram Stain   Final    ABUNDANT WBC PRESENT, PREDOMINANTLY PMN ABUNDANT GRAM NEGATIVE RODS ABUNDANT GRAM POSITIVE COCCI Performed at [facility], [place] [address], [place], [place] [address]    Culture   Final    MODERATE ENTEROCOCCUS AVIUM MIXED ANAEROBIC FLORA PRESENT.  CALL LAB IF FURTHER IID REQUIRED.    Report Status 10/23/2017 FINAL  Final   Organism ID, Bacteria ENTEROCOCCUS AVIUM  Final      Susceptibility   Enterococcus avium - MIC*    AMPICILLIN <=2 SENSITIVE Sensitive     VANCOMYCIN <=0.5 SENSITIVE Sensitive     GENTAMICIN SYNERGY SENSITIVE Sensitive     * MODERATE ENTEROCOCCUS AVIUM  Surgical pcr screen     Status: Abnormal   Collection Time: 10/20/17 10:21 PM  Result Value Ref Range Status   MRSA, PCR NEGATIVE NEGATIVE Final   Staphylococcus aureus POSITIVE (A) NEGATIVE Final    Comment: RESULT CALLED TO, READ BACK BY AND VERIFIED WITH: RN DYolanda Manges [phone] @0151  THANEY Performed at [facility], [address] [place], [place] [address]     RADIOLOGY STUDIES/RESULTS: Ct Abdomen Pelvis Wo Contrast  Result Date: 10/16/2017 CLINICAL DATA:  Four-day history of pain, erythema and swelling involving the RIGHT hemiscrotum, associated with fever. Current history of end-stage renal disease for which the patient had a an AV fistula placed on 04/09/2017. Surgical history includes cholecystectomy. EXAM: CT ABDOMEN AND PELVIS WITHOUT CONTRAST TECHNIQUE: Multidetector CT imaging of the abdomen and pelvis was performed following the standard protocol without IV contrast. COMPARISON:  07/10/2017, 05/29/2017 and earlier. FINDINGS: Lower chest: Minimal linear scar/atelectasis in the lingula and both lower lobes. Visualized lung bases otherwise clear. Resolution of RIGHT LOWER LOBE pneumonia since 07/10/2017. Heart mildly  enlarged with RIGHT coronary artery atherosclerosis. Hepatobiliary: Normal unenhanced appearance of the liver. Surgically absent gallbladder. No unexpected biliary ductal dilation. Pancreas: Normal unenhanced appearance. Spleen: Normal unenhanced appearance. Adrenals/Urinary Tract: Normal appearing adrenal glands. No evidence of urinary tract calculi or obstruction on either side. Within the limits of the unenhanced technique, no focal parenchymal abnormality involving either kidney. Normal appearing urinary bladder. Stomach/Bowel: Thickening of the wall of the distal esophagus to the level of the EG junction as noted previously. Normal appearing decompressed stomach. Normal-appearing small bowel. Normal-appearing colon with expected stool burden. Normal appendix in the RIGHT UPPER pelvis. Vascular/Lymphatic: Moderate aorto-iliofemoral atherosclerosis without evidence of aneurysm. Enlarged LEFT periaortic retroperitoneal lymph nodes, the largest node measuring approximately 2.3 x 2.1 cm (series 2, image 34), increased in size since the most recent prior CT 07/10/2017 where it measured 1.8 x 1.7 cm at the same level. Multiple upper normal size to slightly enlarged BILATERAL superficial inguinal lymph nodes, increased in size since the most recent prior CT. Reproductive: Prostate gland and seminal vesicles normal in size and appearance for age. Extensive edema/inflammation involving the RIGHT hemiscrotum with marked skin thickening and a likely fluid collection within the thickened skin measuring approximately 1.6 cm (series 3, image 18). Mild edema/inflammation involving the contralateral LEFT hemiscrotum. Large BILATERAL hydroceles. No evidence of gas within the scrotal tissues. Other: None. Musculoskeletal: Large Schmorl's node in the UPPER endplate of P71 with mild compression deformity of the UPPER endplate of G62, unchanged. No new/acute findings. IMPRESSION: 1. Edema/inflammation involving the RIGHT hemiscrotum  with marked skin thickening and a likely abscess within the thickened skin. Edema/inflammation to a lesser degree involves the contralateral LEFT hemiscrotum. Large BILATERAL hydroceles are present. No evidence of gas within the scrotal tissues to suggest Fournier's gangrene. Scrotal ultrasound is recommended in further evaluation to better delineate these findings. 2. Enlarging LEFT periaortic lymph nodes and BILATERAL superficial inguinal lymph nodes since the most recent prior CT 3 months ago. While these are statistically likely reactive, a low-grade lymphoproliferative disorder could have a similar appearance. 3. Stable marked thickening of the wall of the visualized distal esophagus to the level of the EG junction dating back to at least 2017, likely chronic esophagitis. 4.  Aortic Atherosclerosis, advanced for patient age.  (ICD10-170.0) Electronically Signed   By: Evangeline Dakin M.D.   On: 10/16/2017 15:10   US Scrotum W/doppler  Result Date: 10/16/2017 CLINICAL DATA:  Right-sided pain with edema and redness over the last 4 days EXAM: SCROTAL ULTRASOUND DOPPLER ULTRASOUND OF THE TESTICLES TECHNIQUE: Complete ultrasound examination of the testicles, epididymis, and other scrotal structures was performed. Color and spectral Doppler ultrasound were also utilized to evaluate blood flow to the testicles. COMPARISON:  CT abdomen pelvis of 10/16/2016 FINDINGS: Right testicle Measurements: The right testicle measures 3.9 x  2.4 x 2.6 cm. No intratesticular abnormality is seen. There is blood flow to the right testicle with arterial and venous waveforms. Left testicle Measurements: The left testicle measures 4.2 x 2.7 x 2.6 cm. No intratesticular abnormality is seen. Blood flow is demonstrated to the left testicle with arterial and venous waveforms. Right epididymis:  The right epididymis is unremarkable. Left epididymis: The left epididymis appears enlarged and inhomogeneous as well as hypervascular suggesting  left epididymitis. Hydrocele: There is a small right hydrocele present which appears partially septated. Varicocele:  No varicocele is seen. Pulsed Doppler interrogation of both testes demonstrates normal low resistance arterial and venous waveforms bilaterally. There is diffuse scrotal edema and skin thickening present. No discrete abscess is detected. IMPRESSION: 1. Diffusely edematous scrotal wall.  No abscess. 2. Irregular and enlarged left epididymis with hypervascularity suggestive of left epididymitis. 3. No intratesticular abnormality is seen. Blood flow is demonstrated to both testicles with arterial and venous waveforms. 4. Small right hydrocele which appears septated. Electronically Signed   By: Ivar Drape M.D.   On: 10/16/2017 16:22     LOS: 11 days   Lala Lund, MD  [facility]  If 7PM-7AM, please contact night-coverage  Please page via [email or web address] [email or web address]-click on MD name and type text message  10/27/2017, 11:56 AM

## 2017-10-27 NOTE — Progress Notes (Signed)
Pt noted to have removed dressing from scrotum.  Pt states "I feel like I have a diaper on."  This nurse educated on risks and benefits of keeping dressing intact.  Pt also educated on hand hygiene.  Pt not receptive to redirection.  Dressing replaced at this time.  [id number]

## 2017-10-28 LAB — GLUCOSE, CAPILLARY
GLUCOSE-CAPILLARY: 240 mg/dL — AB (ref 70–99)
Glucose-Capillary: 237 mg/dL — ABNORMAL HIGH (ref 70–99)

## 2017-10-28 LAB — BASIC METABOLIC PANEL
Anion gap: 12 (ref 5–15)
BUN: 27 mg/dL — AB (ref 6–20)
CHLORIDE: 103 mmol/L (ref 98–111)
CO2: 22 mmol/L (ref 22–32)
CREATININE: 4.63 mg/dL — AB (ref 0.61–1.24)
Calcium: 9 mg/dL (ref 8.9–10.3)
GFR calc Af Amer: 18 mL/min — ABNORMAL LOW (ref >60)
GFR calc non Af Amer: 15 mL/min — ABNORMAL LOW (ref >60)
Glucose, Bld: 251 mg/dL — ABNORMAL HIGH (ref 70–99)
Potassium: 4.7 mmol/L (ref 3.5–5.1)
Sodium: 137 mmol/L (ref 135–145)

## 2017-10-28 LAB — CBC
HCT: 29.4 % — ABNORMAL LOW (ref 39.0–52.0)
Hemoglobin: 8.9 g/dL — ABNORMAL LOW (ref 13.0–17.0)
MCH: 28.4 pg (ref 26.0–34.0)
MCHC: 30.3 g/dL (ref 30.0–36.0)
MCV: 93.9 fL (ref 78.0–100.0)
PLATELETS: 688 10*3/uL — AB (ref 150–400)
RBC: 3.13 MIL/uL — ABNORMAL LOW (ref 4.22–5.81)
RDW: 16.1 % — AB (ref 11.5–15.5)
WBC: 17.2 10*3/uL — ABNORMAL HIGH (ref 4.0–10.5)

## 2017-10-28 LAB — PROTIME-INR
INR: 1.18
Prothrombin Time: 15 seconds (ref 11.4–15.2)

## 2017-10-28 NOTE — Progress Notes (Signed)
Dressing to scrotal wound was not in place when RN assessed patient.  RN informed patient RN would be changing dressing on scrotal wound, patient refused.  Patient agreeable to RN applying gauze and ABD pad to scrotum, but refused to allow RN to clean wound and repack wound.  Patient also refuses bed alarm.  RN educated patient on need for bed alarm to prevent fall while patient is taking pain medication, patient continues to refuse.  RN reported patient's refusal of bed alarm to J. Sheryn Bison, Therapist, sports, Camera operator.  P.J. Linus Mako, RN

## 2017-10-28 NOTE — Progress Notes (Signed)
PROGRESS NOTE        PATIENT DETAILS Name: Brent Daniels Age: 32 y.o. Sex: male Date of Birth: Feb 17, 1985 Admit Date: 10/16/2017 Admitting Physician Jani Gravel, MD CBJ:SEGB, Costella Hatcher, MD  Brief Narrative: Patient is a 32 y.o. male with prior history of DM-1, gastroparesis, CKD stage V-admitted to [facility] on 9/10 sepsis secondary to scrotal cellulitis, AKI on CKD stage V-patient was started on broad-spectrum IV antimicrobial therapy, IV fluids and other supportive care-with these measures, patient's renal function slowly improved, however he developed Fournier's gangrene, patient was transferred to [facility] on 9/13 and underwent scrotal exploration and debridement on 9/14.  See below for further details.  Subjective: Patient in bed, appears comfortable, denies any headache, no fever, no chest pain or pressure, no shortness of breath , no abdominal pain. No focal weakness.  Will have some scrotal pain and discomfort.  Assessment/Plan:  Sepsis secondary to scrotal cellulitis with progression to Fournier's gangrene: Sepsis pathophysiology has resolved, he was treated empirically with IV vancomycin wound cultures seem to be growing pansensitive enterococcus, he was switched to oral Augmentin on 10/23/2017 after discussions with ID Dr. Johnnye Sima, urology is on board and he is status post 2 scrotal washouts on 10/20/2013 and 10/21/2013 respectively, he had a retained Penrose drain in his scrotum but unfortunately patient pulled it out on 10/22/2017, may require another washout.  As per the staff he is not allowing dressing changes as scheduled, patient was counseled.  He still having significant pain and discomfort may require another washout but will defer the decision to urologist.   DW Dr. Kary Kos on 10/26/2017 at 10 AM and Dr. Diona Fanti on 10/27/2017.  Now patient has agreed to stay back.  Continue wound care and packing as allowed by him.  Monitor clinically.  ARF on CKD  stage V: Plan creatinine 4.7, ARF has resolved and he is now close to his baseline.  Continue to monitor.  Hypertension: Blood pressure seems to be reasonably stable with clonidine, amlodipine, will switch IV metoprolol to back to Coreg.  Follow.  Intractable vomiting: Likely due to exacerbation of his gastroparesis, resolved with Reglan and supportive care.  Normocytic anemia: Anemia panel is noncontributory, B12 levels and folate levels are stable, this likely is anemia of chronic disease, type screen done will monitor.  Opioid dependence: Continue Suboxone.  Per prior note from Dr. Richrd Sox was last filled on 9/9-1-week supply.  Tobacco abuse: Counseled.  Poor appetite.  Added Megace along with Marinol and prostat.    DM 1: Very labile sugars due to inconsistent oral intake, have dropped Lantus dose on 10/27/2017 and stopped nighttime NovoLog coverage, will monitor closely and adjust as needed.  CBG (last 3)  Recent Labs    10/27/17 1631 10/27/17 2227 10/28/17 1012  GLUCAP 195* 157* 240*   Lab Results  Component Value Date   HGBA1C 8.1 (A) 09/19/2017     DVT Prophylaxis: Prophylactic Heparin   Code Status: Full code   Family Communication: None at bedside  Disposition Plan: Medical bed  Antimicrobial agents:  Anti-infectives (From admission, onward)   Start     Dose/Rate Route Frequency Ordered Stop   10/23/17 1000  amoxicillin-clavulanate (AUGMENTIN) 500-125 MG per tablet 500 mg     1 tablet Oral 2 times daily 10/23/17 0917     10/22/17 1130  amoxicillin-clavulanate (AUGMENTIN) 400-57 MG/5ML  suspension 400 mg  Status:  Discontinued     400 mg Oral Every 12 hours 10/22/17 1040 10/23/17 0917   10/22/17 1045  amoxicillin-clavulanate (AUGMENTIN) 875-125 MG per tablet 1 tablet  Status:  Discontinued     1 tablet Oral Every 12 hours 10/22/17 1039 10/22/17 1040   10/21/17 0802  gentamicin (GARAMYCIN) 80 mg in sodium chloride 0.9 % 500 mL irrigation  Status:   Discontinued       As needed 10/21/17 0820 10/21/17 0849   10/21/17 0645  gentamicin (GARAMYCIN) 80 mg in sodium chloride 0.9 % 500 mL irrigation  Status:  Discontinued      Irrigation To Surgery 10/21/17 0636 10/22/17 0645   10/19/17 2230  ceFEPIme (MAXIPIME) 1 g in sodium chloride 0.9 % 100 mL IVPB  Status:  Discontinued     1 g 200 mL/hr over 30 Minutes Intravenous Every 24 hours 10/19/17 2217 10/22/17 1039   10/19/17 2215  gentamicin (GARAMYCIN) 80 mg in sodium chloride 0.9 % 500 mL irrigation      Irrigation To Surgery 10/19/17 2200 10/19/17 2325   10/19/17 2215  gentamicin (GARAMYCIN) 80 mg in sodium chloride 0.9 % 500 mL irrigation      Irrigation To Surgery 10/19/17 2202 10/19/17 2324   10/19/17 2215  metroNIDAZOLE (FLAGYL) IVPB 500 mg  Status:  Discontinued     500 mg 100 mL/hr over 60 Minutes Intravenous Every 8 hours 10/19/17 2216 10/22/17 1039   10/18/17 1200  vancomycin (VANCOCIN) IVPB 1000 mg/200 mL premix  Status:  Discontinued     1,000 mg 200 mL/hr over 60 Minutes Intravenous Every 48 hours 10/16/17 1438 10/23/17 0917   10/18/17 1000  doxycycline (VIBRA-TABS) tablet 100 mg  Status:  Discontinued     100 mg Oral Every 12 hours 10/18/17 0933 10/20/17 1043   10/17/17 0900  doxycycline (VIBRAMYCIN) 100 mg in sodium chloride 0.9 % 250 mL IVPB  Status:  Discontinued     100 mg 125 mL/hr over 120 Minutes Intravenous Every 12 hours 10/17/17 0749 10/18/17 0933   10/16/17 2000  ceFEPIme (MAXIPIME) 2 g in sodium chloride 0.9 % 100 mL IVPB     2 g 200 mL/hr over 30 Minutes Intravenous  Once 10/16/17 1937 10/16/17 2200   10/16/17 1500  vancomycin (VANCOCIN) IVPB 1000 mg/200 mL premix     1,000 mg 200 mL/hr over 60 Minutes Intravenous  Once 10/16/17 1356 10/16/17 1654   10/16/17 1245  vancomycin (VANCOCIN) IVPB 1000 mg/200 mL premix     1,000 mg 200 mL/hr over 60 Minutes Intravenous  Once 10/16/17 1239 10/16/17 1457   10/16/17 1245  cefTRIAXone (ROCEPHIN) 2 g in sodium chloride 0.9  % 100 mL IVPB  Status:  Discontinued     2 g 200 mL/hr over 30 Minutes Intravenous Every 24 hours 10/16/17 1239 10/19/17 2222      Procedures: 9/14>> Scrotal exploration +washout, Debridement of devitalized tissue  9/15 - 2nd washout  CONSULTS:  urology  Time spent: 35 minutes-Greater than 50% of this time was spent in counseling, explanation of diagnosis, planning of further management, and coordination of care.  MEDICATIONS: Scheduled Meds: . amLODipine  10 mg Oral Daily  . amoxicillin-clavulanate  1 tablet Oral BID  . buprenorphine-naloxone  1 tablet Sublingual BID  . carvedilol  25 mg Oral BID WC  . cloNIDine  0.3 mg Transdermal Weekly  . collagenase   Topical Daily  . dextrose  1 ampule Intravenous Once  . dronabinol  5 mg Oral BID AC  . feeding supplement (PRO-STAT SUGAR FREE 64)  30 mL Oral TID WC  . heparin  5,000 Units Subcutaneous Q8H  . insulin aspart  0-9 Units Subcutaneous TID WC  . insulin glargine  20 Units Subcutaneous Daily  . metoCLOPramide (REGLAN) injection  5 mg Intravenous Q8H  . nystatin ointment   Topical BID  . pantoprazole  40 mg Oral Daily  . sodium bicarbonate  650 mg Oral TID   Continuous Infusions:  PRN Meds:.dextrose, haloperidol lactate, hydrALAZINE, morphine injection, ondansetron (ZOFRAN) IV, oxyCODONE, promethazine, tiZANidine   PHYSICAL EXAM: Vital signs: Vitals:   10/27/17 1459 10/27/17 2154 10/28/17 0557 10/28/17 1025  BP: (!) 148/76 (!) 144/71 (!) 178/93 (!) 151/77  Pulse: 82 77 82 87  Resp: 18 18 18    Temp: 98.8 F (37.1 C) 98.2 F (36.8 C) 98.6 F (37 C)   TempSrc: Oral Oral Oral   SpO2: 99% 98% 98%   Weight:   89.3 kg   Height:       Filed Weights   10/22/17 0500 10/23/17 0443 10/28/17 0557  Weight: 91.4 kg 88.9 kg 89.3 kg   Body mass index is 25.01 kg/m.   Exam  Awake Alert, Oriented X 3, No new F.N deficits, Normal affect Heath.AT,PERRAL Supple Neck,No JVD, No cervical lymphadenopathy appriciated.    Symmetrical Chest wall movement, Good air movement bilaterally, CTAB RRR,No Gallops, Rubs or new Murmurs, No Parasternal Heave +ve B.Sounds, Abd Soft, No tenderness, No organomegaly appriciated, No rebound - guarding or rigidity. No Cyanosis, Clubbing or edema, No new Rash or bruise Scrotal area under bandage, he also has a chronic aortic systolic murmur  I have personally reviewed following labs and imaging studies  LABORATORY DATA: CBC: Recent Labs  Lab 10/24/17 0510 10/25/17 0448 10/26/17 0445 10/27/17 0604 10/28/17 0906  WBC 23.5* 20.3* 17.0* 15.7* 17.2*  HGB 9.2* 7.6* 8.4* 8.5* 8.9*  HCT 30.2* 25.1* 27.0* 27.3* 29.4*  MCV 93.5 93.7 91.5 91.3 93.9  PLT 714* 637* 650* 627* 688*    Basic Metabolic Panel: Recent Labs  Lab 10/22/17 0750 10/23/17 0704 10/24/17 0510 10/25/17 0448 10/26/17 0445 10/27/17 0604  NA  --  135 137 139 138 139  K  --  4.2 4.0 3.5 4.1 4.1  CL  --  104 106 110 107 108  CO2  --  13* 16* 20* 21* 22  GLUCOSE  --  341* 331* 200* 98 73  BUN  --  45* 39* 34* 29* 27*  CREATININE  --  5.31* 5.18* 4.84* 4.37* 4.42*  CALCIUM  --  8.7* 8.9 8.4* 8.8* 8.8*  MG 1.9 1.8  --   --   --  1.8    GFR: Estimated Creatinine Clearance: 27.7 mL/min (A) (by C-G formula based on SCr of 4.42 mg/dL (H)).  Liver Function Tests: Recent Labs  Lab 10/24/17 0510 10/25/17 0448  AST 11* 11*  ALT 9 8  ALKPHOS 87 75  BILITOT 0.8 0.5  PROT 6.4* 5.5*  ALBUMIN 2.3* 2.0*   No results for input(s): LIPASE, AMYLASE in the last 168 hours. No results for input(s): AMMONIA in the last 168 hours.  Coagulation Profile: Recent Labs  Lab 10/28/17 0906  INR 1.18    Cardiac Enzymes: No results for input(s): CKTOTAL, CKMB, CKMBINDEX, TROPONINI in the last 168 hours.  BNP (last 3 results) No results for input(s): PROBNP in the last 8760 hours.  HbA1C: No results for input(s): HGBA1C in the last  72 hours.  CBG: Recent Labs  Lab 10/27/17 1138 10/27/17 1206  10/27/17 1631 10/27/17 2227 10/28/17 1012  GLUCAP 57* 98 195* 157* 240*    Lipid Profile: No results for input(s): CHOL, HDL, LDLCALC, TRIG, CHOLHDL, LDLDIRECT in the last 72 hours.  Thyroid Function Tests: No results for input(s): TSH, T4TOTAL, FREET4, T3FREE, THYROIDAB in the last 72 hours.  Anemia Panel: No results for input(s): VITAMINB12, FOLATE, FERRITIN, TIBC, IRON, RETICCTPCT in the last 72 hours.  Urine analysis:    Component Value Date/Time   COLORURINE YELLOW 10/17/2017 0125   APPEARANCEUR HAZY (A) 10/17/2017 0125   LABSPEC 1.014 10/17/2017 0125   PHURINE 5.0 10/17/2017 0125   GLUCOSEU >=500 (A) 10/17/2017 0125   HGBUR MODERATE (A) 10/17/2017 0125   BILIRUBINUR NEGATIVE 10/17/2017 0125   KETONESUR 20 (A) 10/17/2017 0125   PROTEINUR 100 (A) 10/17/2017 0125   UROBILINOGEN 1.0 11/13/2014 0155   NITRITE NEGATIVE 10/17/2017 0125   LEUKOCYTESUR NEGATIVE 10/17/2017 0125    Sepsis Labs: Lactic Acid, Venous    Component Value Date/Time   LATICACIDVEN 1.58 10/16/2017 1318    MICROBIOLOGY: Recent Results (from the past 240 hour(s))  Culture, blood (routine x 2)     Status: None   Collection Time: 10/18/17  6:05 PM  Result Value Ref Range Status   Specimen Description BLOOD LEFT HAND  Final   Special Requests   Final    BOTTLES DRAWN AEROBIC AND ANAEROBIC Blood Culture adequate volume   Culture   Final    NO GROWTH 5 DAYS Performed at [facility], [address] [place], [place] [address]    Report Status 10/23/2017 FINAL  Final  Culture, blood (routine x 2)     Status: None   Collection Time: 10/18/17  6:09 PM  Result Value Ref Range Status   Specimen Description BLOOD LEFT HAND  Final   Special Requests   Final    BOTTLES DRAWN AEROBIC AND ANAEROBIC Blood Culture adequate volume   Culture   Final    NO GROWTH 5 DAYS Performed at [facility], [address] [place], [place] [address]    Report Status 10/23/2017 FINAL  Final  Aerobic/Anaerobic Culture  (surgical/deep wound)     Status: None   Collection Time: 10/19/17 11:27 PM  Result Value Ref Range Status   Specimen Description ABSCESS SCROTUM  Final   Special Requests PATIENT ON FOLLOWING VANC DOXYCYCLINE CEFEPIME  Final   Gram Stain   Final    ABUNDANT WBC PRESENT, PREDOMINANTLY PMN ABUNDANT GRAM NEGATIVE RODS ABUNDANT GRAM POSITIVE COCCI Performed at [facility], [place] [address] [place], [place] [address]    Culture   Final    MODERATE ENTEROCOCCUS AVIUM MIXED ANAEROBIC FLORA PRESENT.  CALL LAB IF FURTHER IID REQUIRED.    Report Status 10/23/2017 FINAL  Final   Organism ID, Bacteria ENTEROCOCCUS AVIUM  Final      Susceptibility   Enterococcus avium - MIC*    AMPICILLIN <=2 SENSITIVE Sensitive     VANCOMYCIN <=0.5 SENSITIVE Sensitive     GENTAMICIN SYNERGY SENSITIVE Sensitive     * MODERATE ENTEROCOCCUS AVIUM  Surgical pcr screen     Status: Abnormal   Collection Time: 10/20/17 10:21 PM  Result Value Ref Range Status   MRSA, PCR NEGATIVE NEGATIVE Final   Staphylococcus aureus POSITIVE (A) NEGATIVE Final    Comment: RESULT CALLED TO, READ BACK BY AND VERIFIED WITH: RN DYolanda Manges [phone] @0151  THANEY Performed at [facility], [place] [address], [place], [place] [address]     RADIOLOGY STUDIES/RESULTS: Ct Abdomen Pelvis Wo Contrast  Result Date: 10/16/2017 CLINICAL DATA:  Four-day history of pain, erythema and swelling involving the RIGHT hemiscrotum, associated with fever. Current history of end-stage renal disease for which the patient had a an AV fistula placed on 04/09/2017. Surgical history includes cholecystectomy. EXAM: CT ABDOMEN AND PELVIS WITHOUT CONTRAST TECHNIQUE: Multidetector CT imaging of the abdomen and pelvis was performed following the standard protocol without IV contrast. COMPARISON:  07/10/2017, 05/29/2017 and earlier. FINDINGS: Lower chest: Minimal linear scar/atelectasis in the lingula and both lower lobes. Visualized lung bases otherwise  clear. Resolution of RIGHT LOWER LOBE pneumonia since 07/10/2017. Heart mildly enlarged with RIGHT coronary artery atherosclerosis. Hepatobiliary: Normal unenhanced appearance of the liver. Surgically absent gallbladder. No unexpected biliary ductal dilation. Pancreas: Normal unenhanced appearance. Spleen: Normal unenhanced appearance. Adrenals/Urinary Tract: Normal appearing adrenal glands. No evidence of urinary tract calculi or obstruction on either side. Within the limits of the unenhanced technique, no focal parenchymal abnormality involving either kidney. Normal appearing urinary bladder. Stomach/Bowel: Thickening of the wall of the distal esophagus to the level of the EG junction as noted previously. Normal appearing decompressed stomach. Normal-appearing small bowel. Normal-appearing colon with expected stool burden. Normal appendix in the RIGHT UPPER pelvis. Vascular/Lymphatic: Moderate aorto-iliofemoral atherosclerosis without evidence of aneurysm. Enlarged LEFT periaortic retroperitoneal lymph nodes, the largest node measuring approximately 2.3 x 2.1 cm (series 2, image 34), increased in size since the most recent prior CT 07/10/2017 where it measured 1.8 x 1.7 cm at the same level. Multiple upper normal size to slightly enlarged BILATERAL superficial inguinal lymph nodes, increased in size since the most recent prior CT. Reproductive: Prostate gland and seminal vesicles normal in size and appearance for age. Extensive edema/inflammation involving the RIGHT hemiscrotum with marked skin thickening and a likely fluid collection within the thickened skin measuring approximately 1.6 cm (series 3, image 18). Mild edema/inflammation involving the contralateral LEFT hemiscrotum. Large BILATERAL hydroceles. No evidence of gas within the scrotal tissues. Other: None. Musculoskeletal: Large Schmorl's node in the UPPER endplate of Y07 with mild compression deformity of the UPPER endplate of P71, unchanged. No  new/acute findings. IMPRESSION: 1. Edema/inflammation involving the RIGHT hemiscrotum with marked skin thickening and a likely abscess within the thickened skin. Edema/inflammation to a lesser degree involves the contralateral LEFT hemiscrotum. Large BILATERAL hydroceles are present. No evidence of gas within the scrotal tissues to suggest Fournier's gangrene. Scrotal ultrasound is recommended in further evaluation to better delineate these findings. 2. Enlarging LEFT periaortic lymph nodes and BILATERAL superficial inguinal lymph nodes since the most recent prior CT 3 months ago. While these are statistically likely reactive, a low-grade lymphoproliferative disorder could have a similar appearance. 3. Stable marked thickening of the wall of the visualized distal esophagus to the level of the EG junction dating back to at least 2017, likely chronic esophagitis. 4.  Aortic Atherosclerosis, advanced for patient age.  (ICD10-170.0) Electronically Signed   By: Evangeline Dakin M.D.   On: 10/16/2017 15:10   US Scrotum W/doppler  Result Date: 10/16/2017 CLINICAL DATA:  Right-sided pain with edema and redness over the last 4 days EXAM: SCROTAL ULTRASOUND DOPPLER ULTRASOUND OF THE TESTICLES TECHNIQUE: Complete ultrasound examination of the testicles, epididymis, and other scrotal structures was performed. Color and spectral Doppler ultrasound were also utilized to evaluate blood flow to the testicles. COMPARISON:  CT abdomen pelvis of 10/16/2016 FINDINGS: Right testicle Measurements: The right testicle  measures 3.9 x 2.4 x 2.6 cm. No intratesticular abnormality is seen. There is blood flow to the right testicle with arterial and venous waveforms. Left testicle Measurements: The left testicle measures 4.2 x 2.7 x 2.6 cm. No intratesticular abnormality is seen. Blood flow is demonstrated to the left testicle with arterial and venous waveforms. Right epididymis:  The right epididymis is unremarkable. Left epididymis: The  left epididymis appears enlarged and inhomogeneous as well as hypervascular suggesting left epididymitis. Hydrocele: There is a small right hydrocele present which appears partially septated. Varicocele:  No varicocele is seen. Pulsed Doppler interrogation of both testes demonstrates normal low resistance arterial and venous waveforms bilaterally. There is diffuse scrotal edema and skin thickening present. No discrete abscess is detected. IMPRESSION: 1. Diffusely edematous scrotal wall.  No abscess. 2. Irregular and enlarged left epididymis with hypervascularity suggestive of left epididymitis. 3. No intratesticular abnormality is seen. Blood flow is demonstrated to both testicles with arterial and venous waveforms. 4. Small right hydrocele which appears septated. Electronically Signed   By: Ivar Drape M.D.   On: 10/16/2017 16:22     LOS: 12 days   Lala Lund, MD  [facility]  If 7PM-7AM, please contact night-coverage  Please page via [email or web address]-click on MD name and type text message  10/28/2017, 11:04 AM

## 2017-10-28 NOTE — Progress Notes (Signed)
7 Days Post-Op Subjective: Patient aggressively noncooperative  Objective: Vital signs in last 24 hours: Temp:  [98.2 F (36.8 C)-98.8 F (37.1 C)] 98.6 F (37 C) (09/22 0557) Pulse Rate:  [77-82] 82 (09/22 0557) Resp:  [18] 18 (09/22 0557) BP: (144-178)/(71-93) 178/93 (09/22 0557) SpO2:  [98 %-99 %] 98 % (09/22 0557) Weight:  [89.3 kg] 89.3 kg (09/22 0557)  Intake/Output from previous day: 09/21 0701 - 09/22 0700 In: 564 [P.O.:564] Out: -  Intake/Output this shift: No intake/output data recorded.  Physical Exam:  Constitutional: Vital signs reviewed. WD WN in NAD   Eyes: PERRL, No scleral icterus.   Pulmonary/Chest: Normal effort Genitourinary: Wound open, clean appearing. Mild left scrotal erythema/induration--stable. No tenderness.   Lab Results: Recent Labs    10/26/17 0445 10/27/17 0604  HGB 8.4* 8.5*  HCT 27.0* 27.3*   BMET Recent Labs    10/26/17 0445 10/27/17 0604  NA 138 139  K 4.1 4.1  CL 107 108  CO2 21* 22  GLUCOSE 98 73  BUN 29* 27*  CREATININE 4.37* 4.42*  CALCIUM 8.8* 8.8*   No results for input(s): LABPT, INR in the last 72 hours. No results for input(s): LABURIN in the last 72 hours. Results for orders placed or performed during the hospital encounter of 10/16/17  Blood Culture (routine x 2)     Status: None   Collection Time: 10/16/17 12:50 PM  Result Value Ref Range Status   Specimen Description BLOOD LEFT ANTECUBITAL  Final   Special Requests   Final    BOTTLES DRAWN AEROBIC AND ANAEROBIC Blood Culture results may not be optimal due to an inadequate volume of blood received in culture bottles   Culture   Final    NO GROWTH 5 DAYS Performed at [facility], [address] [place], [place] [address]    Report Status 10/21/2017 FINAL  Final  Blood Culture (routine x 2)     Status: None   Collection Time: 10/16/17  9:07 PM  Result Value Ref Range Status   Specimen Description LEFT ANTECUBITAL  Final   Special Requests   Final   BOTTLES DRAWN AEROBIC AND ANAEROBIC Blood Culture adequate volume   Culture   Final    NO GROWTH 5 DAYS Performed at [facility], [address], [place], [place] [address]    Report Status 10/21/2017 FINAL  Final  MRSA PCR Screening     Status: None   Collection Time: 10/17/17  4:37 AM  Result Value Ref Range Status   MRSA by PCR NEGATIVE NEGATIVE Final    Comment:        The GeneXpert MRSA Assay (FDA approved for NASAL specimens only), is one component of a comprehensive MRSA colonization surveillance program. It is not intended to diagnose MRSA infection nor to guide or monitor treatment for MRSA infections. Performed at [facility], [address], [place], [place] [address]   Culture, blood (routine x 2)     Status: None   Collection Time: 10/18/17  6:05 PM  Result Value Ref Range Status   Specimen Description BLOOD LEFT HAND  Final   Special Requests   Final    BOTTLES DRAWN AEROBIC AND ANAEROBIC Blood Culture adequate volume   Culture   Final    NO GROWTH 5 DAYS Performed at [facility], [address] [place], [place] [address]    Report Status 10/23/2017 FINAL  Final  Culture, blood (routine x 2)     Status: None  Collection Time: 10/18/17  6:09 PM  Result Value Ref Range Status   Specimen Description BLOOD LEFT HAND  Final   Special Requests   Final    BOTTLES DRAWN AEROBIC AND ANAEROBIC Blood Culture adequate volume   Culture   Final    NO GROWTH 5 DAYS Performed at [facility], [address] [place], [place] [address]    Report Status 10/23/2017 FINAL  Final  Aerobic/Anaerobic Culture (surgical/deep wound)     Status: None   Collection Time: 10/19/17 11:27 PM  Result Value Ref Range Status   Specimen Description ABSCESS SCROTUM  Final   Special Requests PATIENT ON FOLLOWING VANC DOXYCYCLINE CEFEPIME  Final   Gram Stain   Final    ABUNDANT WBC PRESENT, PREDOMINANTLY PMN ABUNDANT GRAM NEGATIVE RODS ABUNDANT GRAM POSITIVE COCCI Performed at [facility], [place] [address] [place], [place] [address]    Culture   Final    MODERATE ENTEROCOCCUS AVIUM MIXED ANAEROBIC FLORA PRESENT.  CALL LAB IF FURTHER IID REQUIRED.    Report Status 10/23/2017 FINAL  Final   Organism ID, Bacteria ENTEROCOCCUS AVIUM  Final      Susceptibility   Enterococcus avium - MIC*    AMPICILLIN <=2 SENSITIVE Sensitive     VANCOMYCIN <=0.5 SENSITIVE Sensitive     GENTAMICIN SYNERGY SENSITIVE Sensitive     * MODERATE ENTEROCOCCUS AVIUM  Surgical pcr screen     Status: Abnormal   Collection Time: 10/20/17 10:21 PM  Result Value Ref Range Status   MRSA, PCR NEGATIVE NEGATIVE Final   Staphylococcus aureus POSITIVE (A) NEGATIVE Final    Comment: RESULT CALLED TO, READ BACK BY AND VERIFIED WITH: RN DYolanda Manges [phone] @0151  THANEY Performed at [facility] [address] [place], [place] [address]     Studies/Results: No results found.  Assessment/Plan:   Wound to me looks as if it is improving. Will get back on q 8 hr dressing changes--recheck and possible d/c on Monday once cleared by Dr Louis Meckel   LOS: 12 days   Brent Daniels 10/28/2017, 9:49 AM

## 2017-10-29 LAB — GLUCOSE, CAPILLARY
GLUCOSE-CAPILLARY: 209 mg/dL — AB (ref 70–99)
GLUCOSE-CAPILLARY: 323 mg/dL — AB (ref 70–99)
GLUCOSE-CAPILLARY: 225 mg/dL — AB (ref 70–99)
GLUCOSE-CAPILLARY: 387 mg/dL — AB (ref 70–99)
Glucose-Capillary: 327 mg/dL — ABNORMAL HIGH (ref 70–99)
Glucose-Capillary: 246 mg/dL — ABNORMAL HIGH (ref 70–99)

## 2017-10-29 LAB — CBC
HCT: 26.3 % — ABNORMAL LOW (ref 39.0–52.0)
Hemoglobin: 8.3 g/dL — ABNORMAL LOW (ref 13.0–17.0)
MCH: 29 pg (ref 26.0–34.0)
MCHC: 31.6 g/dL (ref 30.0–36.0)
MCV: 92 fL (ref 78.0–100.0)
Platelets: 541 10*3/uL — ABNORMAL HIGH (ref 150–400)
RBC: 2.86 MIL/uL — ABNORMAL LOW (ref 4.22–5.81)
RDW: 15.5 % (ref 11.5–15.5)
WBC: 15.8 10*3/uL — AB (ref 4.0–10.5)

## 2017-10-29 LAB — BASIC METABOLIC PANEL
ANION GAP: 17 — AB (ref 5–15)
Anion gap: 12 (ref 5–15)
BUN: 34 mg/dL — AB (ref 6–20)
BUN: 36 mg/dL — AB (ref 6–20)
CHLORIDE: 100 mmol/L (ref 98–111)
CO2: 17 mmol/L — ABNORMAL LOW (ref 22–32)
CO2: 23 mmol/L (ref 22–32)
CREATININE: 4.79 mg/dL — AB (ref 0.61–1.24)
Calcium: 8.6 mg/dL — ABNORMAL LOW (ref 8.9–10.3)
Calcium: 8.9 mg/dL (ref 8.9–10.3)
Chloride: 96 mmol/L — ABNORMAL LOW (ref 98–111)
Creatinine, Ser: 4.89 mg/dL — ABNORMAL HIGH (ref 0.61–1.24)
GFR calc Af Amer: 16 mL/min — ABNORMAL LOW (ref >60)
GFR calc Af Amer: 17 mL/min — ABNORMAL LOW (ref >60)
GFR calc non Af Amer: 15 mL/min — ABNORMAL LOW (ref >60)
GFR, EST NON AFRICAN AMERICAN: 14 mL/min — AB (ref >60)
GLUCOSE: 425 mg/dL — AB (ref 70–99)
GLUCOSE: 255 mg/dL — AB (ref 70–99)
Potassium: 5.6 mmol/L — ABNORMAL HIGH (ref 3.5–5.1)
Potassium: 4.4 mmol/L (ref 3.5–5.1)
SODIUM: 130 mmol/L — AB (ref 135–145)
SODIUM: 135 mmol/L (ref 135–145)

## 2017-10-29 MED ORDER — SODIUM POLYSTYRENE SULFONATE 15 GM/60ML PO SUSP
30.0000 g | Freq: Once | ORAL | Status: AC
  Administered 2017-10-29: 30 g via ORAL
  Filled 2017-10-29: qty 120

## 2017-10-29 MED ORDER — ZOLPIDEM TARTRATE 5 MG PO TABS: 5.0000 mg | ORAL_TABLET | Freq: Every evening | ORAL | Status: DC | PRN

## 2017-10-29 MED ORDER — "DRESSING SPONGES 4""X4"" PADS": MEDICATED_PAD | 1 refills | Status: DC

## 2017-10-29 MED ORDER — LACTATED RINGERS IV SOLN
INTRAVENOUS | Status: DC
  Administered 2017-10-29: 100 mL/h via INTRAVENOUS

## 2017-10-29 MED ORDER — AMOXICILLIN-POT CLAVULANATE 500-125 MG PO TABS: 1.0000 | ORAL_TABLET | Freq: Two times a day (BID) | ORAL | 0 refills | Status: DC

## 2017-10-29 MED ORDER — COLLAGENASE 250 UNIT/GM EX OINT: TOPICAL_OINTMENT | Freq: Every day | CUTANEOUS | 0 refills | Status: DC

## 2017-10-29 MED ORDER — INSULIN ASPART 100 UNIT/ML ~~LOC~~ SOLN
0.0000 [IU] | Freq: Three times a day (TID) | SUBCUTANEOUS | Status: DC
  Administered 2017-10-29: 3 [IU] via SUBCUTANEOUS

## 2017-10-29 MED ORDER — INSULIN ASPART 100 UNIT/ML ~~LOC~~ SOLN: 0.0000 [IU] | Freq: Every day | SUBCUTANEOUS | Status: DC

## 2017-10-29 MED ORDER — INSULIN ASPART 100 UNIT/ML ~~LOC~~ SOLN: 3.0000 [IU] | Freq: Three times a day (TID) | SUBCUTANEOUS | Status: DC

## 2017-10-29 MED ORDER — INSULIN GLARGINE 100 UNIT/ML ~~LOC~~ SOLN
30.0000 [IU] | Freq: Every day | SUBCUTANEOUS | Status: DC
  Administered 2017-10-29: 30 [IU] via SUBCUTANEOUS
  Filled 2017-10-29: qty 0.3

## 2017-10-29 MED ORDER — OXYCODONE HCL 5 MG PO TABS: 5.0000 mg | ORAL_TABLET | Freq: Four times a day (QID) | ORAL | 0 refills | Status: DC | PRN

## 2017-10-29 MED ORDER — INSULIN ASPART 100 UNIT/ML ~~LOC~~ SOLN
0.0000 [IU] | SUBCUTANEOUS | Status: DC
  Administered 2017-10-29: 9 [IU] via SUBCUTANEOUS

## 2017-10-29 NOTE — Progress Notes (Signed)
Pt's family in to visit.  Pt asking to be discharged to home at this time. MD notified via amion. Awaiting response.  [email or web address]

## 2017-10-29 NOTE — Progress Notes (Signed)
NCM called [facility] @ [phone] referral made for home health services(RN) via voice message, awaiting call back from [facility]. Whitman Hero CM,BSN,CM

## 2017-10-29 NOTE — Care Management Note (Signed)
Case Management Note  Patient Details  Name: Brent Daniels MRN: [id number] Date of Birth: May 19, 1985  Subjective/Objective:        Sepsis/ scrotal cellulitis.     Lear Ng (Mother) Kannon Granderson (Sister)    [phone]     PCP: Jerene Bears  Action/Plan: Transition to home with home health services to follow when medically stable.  Expected Discharge Date:                  Expected Discharge Plan:  [place]  In-House Referral:     Discharge planning Services  CM Consult  Post Acute Care Choice:    Choice offered to:     DME Arranged:    DME Agency:     HH Arranged:  PT Morgan:       10/29/2017 @  15:11  - NCM faxed home health order, demographics, H&P, to [facility] @ [phone].  Status of Service:  In process, will continue to follow  If discussed at Long Length of Stay Meetings, dates discussed:    Additional Comments:  Sharin Mons, RN 10/29/2017, 1:37 PM

## 2017-10-29 NOTE — Progress Notes (Signed)
PROGRESS NOTE        PATIENT DETAILS Name: Brent Daniels Age: 32 y.o. Sex: male Date of Birth: December 17, 1985 Admit Date: 10/16/2017 Admitting Physician Jani Gravel, MD XLK:GMWN, Costella Hatcher, MD  Brief Narrative: Patient is a 32 y.o. male with prior history of DM-1, gastroparesis, CKD stage V-admitted to [facility] on 9/10 sepsis secondary to scrotal cellulitis, AKI on CKD stage V-patient was started on broad-spectrum IV antimicrobial therapy, IV fluids and other supportive care-with these measures, patient's renal function slowly improved, however he developed Fournier's gangrene, patient was transferred to [facility] on 9/13 and underwent scrotal exploration and debridement on 9/14.  See below for further details.  Subjective: Patient in bed, appears comfortable, denies any headache, no fever, no chest pain or pressure, no shortness of breath , no abdominal pain. No focal weakness.  He was nauseated and threw up this morning, says scrotal pain is better, again threatening to leave [facility] consult.   Assessment/Plan:  Sepsis secondary to scrotal cellulitis with progression to Fournier's gangrene: Sepsis pathophysiology has resolved, he was treated empirically with IV vancomycin wound cultures seem to be growing pansensitive enterococcus, he was switched to oral Augmentin on 10/23/2017 after discussions with ID Dr. Johnnye Sima, urology is on board and he is status post 2 scrotal washouts on 10/20/2013 and 10/21/2013 respectively, he had a retained Penrose drain in his scrotum but unfortunately patient pulled it out on 10/22/2017, may require another washout.  As per the staff he is not allowing dressing changes as scheduled, patient was counseled.  He still having significant pain and discomfort may require another washout but will defer the decision to urologist.   DW Dr. Kary Kos on 10/26/2017 at 10 AM and Dr. Diona Fanti on 10/27/2017.  Clinically better may discharge tomorrow with wound  care once renal function and potassium are better.  ARF on CKD stage V with hyponatremia and hyperkalemia: Renal function slightly worse morning of 10/29/2017 along with hyponatremia, gentle hydration and monitor avoid nephrotoxins.  He accelerated given morning of 10/29/2017.  Hypertension: Blood pressure seems to be reasonably stable with clonidine, amlodipine, will switch IV metoprolol to back to Coreg.  Follow.  Intractable vomiting: Likely due to exacerbation of his gastroparesis, resolved with Reglan and supportive care.  Normocytic anemia: Anemia panel is noncontributory, B12 levels and folate levels are stable, this likely is anemia of chronic disease, type screen done will monitor.  Opioid dependence: Continue Suboxone.  Per prior note from Dr. Richrd Sox was last filled on 9/9-1-week supply.  Tobacco abuse: Counseled.  Poor appetite.  Added Megace along with Marinol and prostat.    DM 1: Very labile sugars due to inconsistent oral intake, he drank a lot of soda last night as well.  Will adjust Lantus and sliding scale, add pre-meal NovoLog today and monitor closely.  CBG (last 3)  Recent Labs    10/28/17 2114 10/29/17 0752 10/29/17 1154  GLUCAP 225* 387* 327*   Lab Results  Component Value Date   HGBA1C 8.1 (A) 09/19/2017     DVT Prophylaxis: Prophylactic Heparin   Code Status: Full code   Family Communication: None at bedside  Disposition Plan: Medical bed  Antimicrobial agents:  Anti-infectives (From admission, onward)   Start     Dose/Rate Route Frequency Ordered Stop   10/23/17 1000  amoxicillin-clavulanate (AUGMENTIN) 500-125 MG per tablet 500 mg  1 tablet Oral 2 times daily 10/23/17 0917     10/22/17 1130  amoxicillin-clavulanate (AUGMENTIN) 400-57 MG/5ML suspension 400 mg  Status:  Discontinued     400 mg Oral Every 12 hours 10/22/17 1040 10/23/17 0917   10/22/17 1045  amoxicillin-clavulanate (AUGMENTIN) 875-125 MG per tablet 1 tablet  Status:   Discontinued     1 tablet Oral Every 12 hours 10/22/17 1039 10/22/17 1040   10/21/17 0802  gentamicin (GARAMYCIN) 80 mg in sodium chloride 0.9 % 500 mL irrigation  Status:  Discontinued       As needed 10/21/17 0820 10/21/17 0849   10/21/17 0645  gentamicin (GARAMYCIN) 80 mg in sodium chloride 0.9 % 500 mL irrigation  Status:  Discontinued      Irrigation To Surgery 10/21/17 0636 10/22/17 0645   10/19/17 2230  ceFEPIme (MAXIPIME) 1 g in sodium chloride 0.9 % 100 mL IVPB  Status:  Discontinued     1 g 200 mL/hr over 30 Minutes Intravenous Every 24 hours 10/19/17 2217 10/22/17 1039   10/19/17 2215  gentamicin (GARAMYCIN) 80 mg in sodium chloride 0.9 % 500 mL irrigation      Irrigation To Surgery 10/19/17 2200 10/19/17 2325   10/19/17 2215  gentamicin (GARAMYCIN) 80 mg in sodium chloride 0.9 % 500 mL irrigation      Irrigation To Surgery 10/19/17 2202 10/19/17 2324   10/19/17 2215  metroNIDAZOLE (FLAGYL) IVPB 500 mg  Status:  Discontinued     500 mg 100 mL/hr over 60 Minutes Intravenous Every 8 hours 10/19/17 2216 10/22/17 1039   10/18/17 1200  vancomycin (VANCOCIN) IVPB 1000 mg/200 mL premix  Status:  Discontinued     1,000 mg 200 mL/hr over 60 Minutes Intravenous Every 48 hours 10/16/17 1438 10/23/17 0917   10/18/17 1000  doxycycline (VIBRA-TABS) tablet 100 mg  Status:  Discontinued     100 mg Oral Every 12 hours 10/18/17 0933 10/20/17 1043   10/17/17 0900  doxycycline (VIBRAMYCIN) 100 mg in sodium chloride 0.9 % 250 mL IVPB  Status:  Discontinued     100 mg 125 mL/hr over 120 Minutes Intravenous Every 12 hours 10/17/17 0749 10/18/17 0933   10/16/17 2000  ceFEPIme (MAXIPIME) 2 g in sodium chloride 0.9 % 100 mL IVPB     2 g 200 mL/hr over 30 Minutes Intravenous  Once 10/16/17 1937 10/16/17 2200   10/16/17 1500  vancomycin (VANCOCIN) IVPB 1000 mg/200 mL premix     1,000 mg 200 mL/hr over 60 Minutes Intravenous  Once 10/16/17 1356 10/16/17 1654   10/16/17 1245  vancomycin (VANCOCIN) IVPB  1000 mg/200 mL premix     1,000 mg 200 mL/hr over 60 Minutes Intravenous  Once 10/16/17 1239 10/16/17 1457   10/16/17 1245  cefTRIAXone (ROCEPHIN) 2 g in sodium chloride 0.9 % 100 mL IVPB  Status:  Discontinued     2 g 200 mL/hr over 30 Minutes Intravenous Every 24 hours 10/16/17 1239 10/19/17 2222      Procedures: 9/14>> Scrotal exploration +washout, Debridement of devitalized tissue  9/15 - 2nd washout  CONSULTS:  urology  Time spent: 35 minutes-Greater than 50% of this time was spent in counseling, explanation of diagnosis, planning of further management, and coordination of care.  MEDICATIONS: Scheduled Meds: . amLODipine  10 mg Oral Daily  . amoxicillin-clavulanate  1 tablet Oral BID  . buprenorphine-naloxone  1 tablet Sublingual BID  . carvedilol  25 mg Oral BID WC  . cloNIDine  0.3 mg Transdermal Weekly  .  collagenase   Topical Daily  . dextrose  1 ampule Intravenous Once  . dronabinol  5 mg Oral BID AC  . feeding supplement (PRO-STAT SUGAR FREE 64)  30 mL Oral TID WC  . heparin  5,000 Units Subcutaneous Q8H  . insulin aspart  0-9 Units Subcutaneous Q4H  . insulin glargine  30 Units Subcutaneous Daily  . metoCLOPramide (REGLAN) injection  5 mg Intravenous Q8H  . nystatin ointment   Topical BID  . pantoprazole  40 mg Oral Daily  . sodium bicarbonate  650 mg Oral TID   Continuous Infusions: . lactated ringers 100 mL/hr (10/29/17 0827)   PRN Meds:.dextrose, haloperidol lactate, hydrALAZINE, morphine injection, ondansetron (ZOFRAN) IV, oxyCODONE, promethazine, tiZANidine, zolpidem   PHYSICAL EXAM: Vital signs: Vitals:   10/28/17 1338 10/28/17 2117 10/29/17 0457 10/29/17 1100  BP: (!) 153/79 (!) 154/83 (!) 143/68   Pulse: 86 74 80   Resp: 16 18 18    Temp: 98.6 F (37 C) 98.5 F (36.9 C) 98.8 F (37.1 C)   TempSrc: Oral Oral Oral   SpO2: 98% 98% 97% 99%  Weight:      Height:       Filed Weights   10/22/17 0500 10/23/17 0443 10/28/17 0557  Weight: 91.4  kg 88.9 kg 89.3 kg   Body mass index is 25.01 kg/m.   Exam  Awake Alert, Oriented X 3, No new F.N deficits, Normal affect [place].AT,PERRAL Supple Neck,No JVD, No cervical lymphadenopathy appriciated.  Symmetrical Chest wall movement, Good air movement bilaterally, CTAB RRR,No Gallops, Rubs or new Murmurs, No Parasternal Heave +ve B.Sounds, Abd Soft, No tenderness, No organomegaly appriciated, No rebound - guarding or rigidity. No Cyanosis, Clubbing or edema, No new Rash or bruise Scrotal area under bandage, he also has a chronic aortic systolic murmur  I have personally reviewed following labs and imaging studies  LABORATORY DATA: CBC: Recent Labs  Lab 10/25/17 0448 10/26/17 0445 10/27/17 0604 10/28/17 0906 10/29/17 0517  WBC 20.3* 17.0* 15.7* 17.2* 15.8*  HGB 7.6* 8.4* 8.5* 8.9* 8.3*  HCT 25.1* 27.0* 27.3* 29.4* 26.3*  MCV 93.7 91.5 91.3 93.9 92.0  PLT 637* 650* 627* 688* 541*    Basic Metabolic Panel: Recent Labs  Lab 10/23/17 0704  10/25/17 0448 10/26/17 0445 10/27/17 0604 10/28/17 0906 10/29/17 0517  NA 135   < > 139 138 139 137 130*  K 4.2   < > 3.5 4.1 4.1 4.7 5.6*  CL 104   < > 110 107 108 103 96*  CO2 13*   < > 20* 21* 22 22 17*  GLUCOSE 341*   < > 200* 98 73 251* 425*  BUN 45*   < > 34* 29* 27* 27* 34*  CREATININE 5.31*   < > 4.84* 4.37* 4.42* 4.63* 4.89*  CALCIUM 8.7*   < > 8.4* 8.8* 8.8* 9.0 8.6*  MG 1.8  --   --   --  1.8  --   --    < > = values in this interval not displayed.    GFR: Estimated Creatinine Clearance: 25 mL/min (A) (by C-G formula based on SCr of 4.89 mg/dL (H)).  Liver Function Tests: Recent Labs  Lab 10/24/17 0510 10/25/17 0448  AST 11* 11*  ALT 9 8  ALKPHOS 87 75  BILITOT 0.8 0.5  PROT 6.4* 5.5*  ALBUMIN 2.3* 2.0*   No results for input(s): LIPASE, AMYLASE in the last 168 hours. No results for input(s): AMMONIA in the last 168 hours.  Coagulation Profile: Recent Labs  Lab 10/28/17 0906  INR 1.18    Cardiac  Enzymes: No results for input(s): CKTOTAL, CKMB, CKMBINDEX, TROPONINI in the last 168 hours.  BNP (last 3 results) No results for input(s): PROBNP in the last 8760 hours.  HbA1C: No results for input(s): HGBA1C in the last 72 hours.  CBG: Recent Labs  Lab 10/28/17 1233 10/28/17 1701 10/28/17 2114 10/29/17 0752 10/29/17 1154  GLUCAP 237* 323* 225* 387* 327*    Lipid Profile: No results for input(s): CHOL, HDL, LDLCALC, TRIG, CHOLHDL, LDLDIRECT in the last 72 hours.  Thyroid Function Tests: No results for input(s): TSH, T4TOTAL, FREET4, T3FREE, THYROIDAB in the last 72 hours.  Anemia Panel: No results for input(s): VITAMINB12, FOLATE, FERRITIN, TIBC, IRON, RETICCTPCT in the last 72 hours.  Urine analysis:    Component Value Date/Time   COLORURINE YELLOW 10/17/2017 0125   APPEARANCEUR HAZY (A) 10/17/2017 0125   LABSPEC 1.014 10/17/2017 0125   PHURINE 5.0 10/17/2017 0125   GLUCOSEU >=500 (A) 10/17/2017 0125   HGBUR MODERATE (A) 10/17/2017 0125   BILIRUBINUR NEGATIVE 10/17/2017 0125   KETONESUR 20 (A) 10/17/2017 0125   PROTEINUR 100 (A) 10/17/2017 0125   UROBILINOGEN 1.0 11/13/2014 0155   NITRITE NEGATIVE 10/17/2017 0125   LEUKOCYTESUR NEGATIVE 10/17/2017 0125    Sepsis Labs: Lactic Acid, Venous    Component Value Date/Time   LATICACIDVEN 1.58 10/16/2017 1318    MICROBIOLOGY: Recent Results (from the past 240 hour(s))  Aerobic/Anaerobic Culture (surgical/deep wound)     Status: None   Collection Time: 10/19/17 11:27 PM  Result Value Ref Range Status   Specimen Description ABSCESS SCROTUM  Final   Special Requests PATIENT ON FOLLOWING VANC DOXYCYCLINE CEFEPIME  Final   Gram Stain   Final    ABUNDANT WBC PRESENT, PREDOMINANTLY PMN ABUNDANT GRAM NEGATIVE RODS ABUNDANT GRAM POSITIVE COCCI Performed at [facility], [place] [address] [place], [place] [address]    Culture   Final    MODERATE ENTEROCOCCUS AVIUM MIXED ANAEROBIC FLORA PRESENT.  CALL LAB IF  FURTHER IID REQUIRED.    Report Status 10/23/2017 FINAL  Final   Organism ID, Bacteria ENTEROCOCCUS AVIUM  Final      Susceptibility   Enterococcus avium - MIC*    AMPICILLIN <=2 SENSITIVE Sensitive     VANCOMYCIN <=0.5 SENSITIVE Sensitive     GENTAMICIN SYNERGY SENSITIVE Sensitive     * MODERATE ENTEROCOCCUS AVIUM  Surgical pcr screen     Status: Abnormal   Collection Time: 10/20/17 10:21 PM  Result Value Ref Range Status   MRSA, PCR NEGATIVE NEGATIVE Final   Staphylococcus aureus POSITIVE (A) NEGATIVE Final    Comment: RESULT CALLED TO, READ BACK BY AND VERIFIED WITH: RN DYolanda Manges [phone] @0151  THANEY Performed at [facility] [address], [place], [place] [address]     RADIOLOGY STUDIES/RESULTS: Ct Abdomen Pelvis Wo Contrast  Result Date: 10/16/2017 CLINICAL DATA:  Four-day history of pain, erythema and swelling involving the RIGHT hemiscrotum, associated with fever. Current history of end-stage renal disease for which the patient had a an AV fistula placed on 04/09/2017. Surgical history includes cholecystectomy. EXAM: CT ABDOMEN AND PELVIS WITHOUT CONTRAST TECHNIQUE: Multidetector CT imaging of the abdomen and pelvis was performed following the standard protocol without IV contrast. COMPARISON:  07/10/2017, 05/29/2017 and earlier. FINDINGS: Lower chest: Minimal linear scar/atelectasis in the lingula and both lower lobes. Visualized lung bases otherwise clear. Resolution of RIGHT LOWER LOBE pneumonia since 07/10/2017.  Heart mildly enlarged with RIGHT coronary artery atherosclerosis. Hepatobiliary: Normal unenhanced appearance of the liver. Surgically absent gallbladder. No unexpected biliary ductal dilation. Pancreas: Normal unenhanced appearance. Spleen: Normal unenhanced appearance. Adrenals/Urinary Tract: Normal appearing adrenal glands. No evidence of urinary tract calculi or obstruction on either side. Within the limits of the unenhanced technique, no focal parenchymal  abnormality involving either kidney. Normal appearing urinary bladder. Stomach/Bowel: Thickening of the wall of the distal esophagus to the level of the EG junction as noted previously. Normal appearing decompressed stomach. Normal-appearing small bowel. Normal-appearing colon with expected stool burden. Normal appendix in the RIGHT UPPER pelvis. Vascular/Lymphatic: Moderate aorto-iliofemoral atherosclerosis without evidence of aneurysm. Enlarged LEFT periaortic retroperitoneal lymph nodes, the largest node measuring approximately 2.3 x 2.1 cm (series 2, image 34), increased in size since the most recent prior CT 07/10/2017 where it measured 1.8 x 1.7 cm at the same level. Multiple upper normal size to slightly enlarged BILATERAL superficial inguinal lymph nodes, increased in size since the most recent prior CT. Reproductive: Prostate gland and seminal vesicles normal in size and appearance for age. Extensive edema/inflammation involving the RIGHT hemiscrotum with marked skin thickening and a likely fluid collection within the thickened skin measuring approximately 1.6 cm (series 3, image 18). Mild edema/inflammation involving the contralateral LEFT hemiscrotum. Large BILATERAL hydroceles. No evidence of gas within the scrotal tissues. Other: None. Musculoskeletal: Large Schmorl's node in the UPPER endplate of J50 with mild compression deformity of the UPPER endplate of K93, unchanged. No new/acute findings. IMPRESSION: 1. Edema/inflammation involving the RIGHT hemiscrotum with marked skin thickening and a likely abscess within the thickened skin. Edema/inflammation to a lesser degree involves the contralateral LEFT hemiscrotum. Large BILATERAL hydroceles are present. No evidence of gas within the scrotal tissues to suggest Fournier's gangrene. Scrotal ultrasound is recommended in further evaluation to better delineate these findings. 2. Enlarging LEFT periaortic lymph nodes and BILATERAL superficial inguinal lymph  nodes since the most recent prior CT 3 months ago. While these are statistically likely reactive, a low-grade lymphoproliferative disorder could have a similar appearance. 3. Stable marked thickening of the wall of the visualized distal esophagus to the level of the EG junction dating back to at least 2017, likely chronic esophagitis. 4.  Aortic Atherosclerosis, advanced for patient age.  (ICD10-170.0) Electronically Signed   By: Evangeline Dakin M.D.   On: 10/16/2017 15:10   US Scrotum W/doppler  Result Date: 10/16/2017 CLINICAL DATA:  Right-sided pain with edema and redness over the last 4 days EXAM: SCROTAL ULTRASOUND DOPPLER ULTRASOUND OF THE TESTICLES TECHNIQUE: Complete ultrasound examination of the testicles, epididymis, and other scrotal structures was performed. Color and spectral Doppler ultrasound were also utilized to evaluate blood flow to the testicles. COMPARISON:  CT abdomen pelvis of 10/16/2016 FINDINGS: Right testicle Measurements: The right testicle measures 3.9 x 2.4 x 2.6 cm. No intratesticular abnormality is seen. There is blood flow to the right testicle with arterial and venous waveforms. Left testicle Measurements: The left testicle measures 4.2 x 2.7 x 2.6 cm. No intratesticular abnormality is seen. Blood flow is demonstrated to the left testicle with arterial and venous waveforms. Right epididymis:  The right epididymis is unremarkable. Left epididymis: The left epididymis appears enlarged and inhomogeneous as well as hypervascular suggesting left epididymitis. Hydrocele: There is a small right hydrocele present which appears partially septated. Varicocele:  No varicocele is seen. Pulsed Doppler interrogation of both testes demonstrates normal low resistance arterial and venous waveforms bilaterally. There is diffuse scrotal edema and skin  thickening present. No discrete abscess is detected. IMPRESSION: 1. Diffusely edematous scrotal wall.  No abscess. 2. Irregular and enlarged left  epididymis with hypervascularity suggestive of left epididymitis. 3. No intratesticular abnormality is seen. Blood flow is demonstrated to both testicles with arterial and venous waveforms. 4. Small right hydrocele which appears septated. Electronically Signed   By: Ivar Drape M.D.   On: 10/16/2017 16:22     LOS: 13 days   Lala Lund, MD  [facility]  If 7PM-7AM, please contact night-coverage  Please page via [email or web address] [email or web address]-click on MD name and type text message  10/29/2017, 2:22 PM

## 2017-10-29 NOTE — Progress Notes (Signed)
Pt discharged to home in stable condition.  All discharge instructions and Rx's reviewed with and given to pt's.  Dressing changes reviewed with pt and mother, 1 month supply of dressing changes sent with pt.  Pt and mother transported off unit via wheelchair with belongings and transporters x2.  [id number]

## 2017-10-29 NOTE — Discharge Instructions (Signed)
Follow with Primary MD Jerene Bears B, MD in 7 days   Get CBC, CMP, 2 view Chest X ray checked  by Primary MD  in 5-7 days    Activity: As tolerated with Full fall precautions use walker/cane & assistance as needed.  Keep your wound clean and dry at all times.  Do daily dressing changes as instructed in the hospital.  Disposition Home    Diet: Heart Healthy Low Carb.  Check CBGs QA CHS  Special Instructions: If you have smoked or chewed Tobacco  in the last 2 yrs please stop smoking, stop any regular Alcohol  and or any Recreational drug use.  On your next visit with your primary care physician please Get Medicines reviewed and adjusted.  Please request your Prim.MD to go over all Hospital Tests and Procedure/Radiological results at the follow up, please get all Hospital records sent to your Prim MD by signing hospital release before you go home.  If you experience worsening of your admission symptoms, develop shortness of breath, life threatening emergency, suicidal or homicidal thoughts you must seek medical attention immediately by calling 911 or calling your MD immediately  if symptoms less severe.  You Must read complete instructions/literature along with all the possible adverse reactions/side effects for all the Medicines you take and that have been prescribed to you. Take any new Medicines after you have completely understood and accpet all the possible adverse reactions/side effects.   Do not drive, operate heavy machinery, perform activities at heights, swimming or participation in water activities or provide baby sitting services if your were admitted for syncope or siezures until you have seen by Primary MD or a Neurologist and advised to do so again.  Do not drive when taking Pain medications. Do not take more than prescribed Pain, Sleep and Anxiety Medications  Please note  You were cared for by a hospitalist during your hospital stay. If you have any questions about your  discharge medications or the care you received while you were in the hospital after you are discharged, you can call the unit and asked to speak with the hospitalist on call if the hospitalist that took care of you is not available. Once you are discharged, your primary care physician will handle any further medical issues. Please note that NO REFILLS for any discharge medications will be authorized once you are discharged, as it is imperative that you return to your primary care physician (or establish a relationship with a primary care physician if you do not have one) for your aftercare needs so that they can reassess your need for medications and monitor your lab values.

## 2017-10-29 NOTE — Consult Note (Signed)
Urology Progress Note   S/p scrotal exploration, debridement and washout 10/20/17  S/p 2nd look washout and primary closure 10/21/17  Subjective: No acute events overnight. The patient has continued to be difficult, and has not been willing to allow appropriate wound care.  Objective: Vital signs in last 24 hours: Temp:  [98.5 F (36.9 C)-98.8 F (37.1 C)] 98.8 F (37.1 C) (09/23 0457) Pulse Rate:  [74-87] 80 (09/23 0457) Resp:  [16-18] 18 (09/23 0457) BP: (143-154)/(68-83) 143/68 (09/23 0457) SpO2:  [97 %-98 %] 97 % (09/23 0457)  Intake/Output from previous day: 09/22 0701 - 09/23 0700 In: 222 [P.O.:222] Out: -  Intake/Output this shift: No intake/output data recorded.  Physical Exam:  General: alert if redirected but drowsy, able to answer questions appropriately intermittently CV: RRR Lungs: Clear Abdomen: Soft, appropriately tender.  GU:  Scrotal wound does not appear to be infected.  There is an area in the midline that has been packed, right at the septum of the scrotum.  I debrided some of the larger fibrinous exudate today as well as removed the sutures that were no longer necessary.  There is still a layer of exudative tissue that needs ongoing debridement.   Ext: NT, No erythema  Lab Results: Recent Labs    10/27/17 0604 10/28/17 0906 10/29/17 0517  HGB 8.5* 8.9* 8.3*  HCT 27.3* 29.4* 26.3*   BMET Recent Labs    10/28/17 0906 10/29/17 0517  NA 137 130*  K 4.7 5.6*  CL 103 96*  CO2 22 17*  GLUCOSE 251* 425*  BUN 27* 34*  CREATININE 4.63* 4.89*  CALCIUM 9.0 8.6*     Studies/Results: No results found.  Assessment/Plan:  32 y.o. male w/ uncontrolled T1DM, developed Fournier's gangrene. S/p initial debridemennt on 10/20/17. S/p 2nd look washout and loose primary closure with drains on 10/21/17.  The wound cultures are growing enterococcus.  The wound is healing and starting to look better, despite his reluctance to allow adequate wound care.  The  regiment of applying the enzymatic debridement ointment on a daily basis seems most appropriate given his difficulties with compliance.  Perhaps we can have a home health wound care nurse follow-up with him daily, at least initially.  From my perspective the patient does not need any additional OR debridement and can be discharged home.  We will plan to follow-up with him next week in clinic, and if the wound is clean enough, we may refer him to plastic surgery at that time for skin graft if able.  Please text me with any additional questions that he.   [phone]   LOS: 13 days   Ardis Hughs 10/29/2017, 7:20 AM

## 2017-10-29 NOTE — Progress Notes (Signed)
Patient continues to refuse medications and dressing changes.  Patient also refusing bed alarm.  RN educated patient regarding need for dressing changes and medication, especially antibiotics, patient voiced understanding and continues to refuse treatment.  Patient removed dressing and states it irritates him.  Patient did agree for RN to apply ABD pad and new mesh panties to keep wound covered.  P.J. Linus Mako, RN

## 2017-10-29 NOTE — Discharge Summary (Signed)
Brent Daniels NIO:[id number] DOB: [birth year] DOA: 10/16/2017  PCP: Glenda Chroman, MD  Admit date: 10/16/2017  Discharge date: 10/29/2017  Admitted From: Home   Disposition:  Home   Recommendations for Outpatient Follow-up:   Follow up with PCP in 1-2 weeks  PCP Please obtain BMP/CBC, 2 view CXR in 1week,  (see Discharge instructions)   PCP Please follow up on the following pending results:    Home Health: PT, RN   Equipment/Devices: None  Consultations: Urology Discharge Condition: Stable   CODE STATUS: Full   Diet Recommendation: Heart Healthy low carbohydrate   Chief Complaint  Patient presents with  . Abscess     Brief history of present illness from the day of admission and additional interim summary    Patient is a 32 y.o. male with prior history of DM-1, gastroparesis, CKD stage V-admitted to [facility] on 9/10 sepsis secondary to scrotal cellulitis, AKI on CKD stage V-patient was started on broad-spectrum IV antimicrobial therapy, IV fluids and other supportive care-with these measures, patient's renal function slowly improved, however he developed Fournier's gangrene, patient was transferred to [facility] on 9/13 and underwent scrotal exploration and debridement on 9/14.  See below for further details.                                                                 Hospital Course    Sepsis secondary to scrotal cellulitis with progression to Fournier's gangrene: Sepsis pathophysiology has resolved, he was treated empirically with IV vancomycin wound cultures seem to be growing pansensitive enterococcus, he was switched to oral Augmentin on 10/23/2017 after discussions with ID Dr. Johnnye Sima, urology is on board and he is status post 2 scrotal washouts on 10/20/2013 and 10/21/2013 respectively, he had a  retained Penrose drain in his scrotum but unfortunately patient pulled it out on 10/22/2017, may require another washout.  Please note patient frequently was not allowing the staff for dressing changes had been extensively counseled not to do so, today he feels better and wants to be discharged home.   DW Dr. Kary Kos on 10/26/2017 at 10 AM and Dr. Diona Fanti on 10/27/2017.  Clinically better okay to discharge today per urology will be given 10 more days of oral Augmentin, dressing instructions provided by the nursing staff, will follow with urology and PCP within a week after discharge.  ARF on CKD stage V with hyponatremia and hyperkalemia: Renal function at baseline follow with PCP within a week for recheck.  Hypertension: Blood pressure seems to be reasonably stable to new home regimen follow with PCP and get monitored and adjusted as needed.  Intractable vomiting: Likely due to exacerbation of his gastroparesis, now resolved.  Normocytic anemia: Anemia panel is noncontributory, B12 levels and folate levels are stable, this likely is anemia of  chronic disease, type screen done will monitor.  Opioid dependence:  Renew home regimen, short-acting narcotic given for the next 4 to 5-day supply total 20 pills.  Tobacco abuse: Counseled to quit.  Poor appetite.  Improved.  DM 1: Very labile sugars due to inconsistent oral intake, continue home regimen A1c was 8.1 this admission requested to check CBGs QA CHS and follow with PCP in a week with the results.   Discharge diagnosis     Principal Problem:   Sepsis ([place]) Active Problems:   Essential hypertension, benign   Diabetic gastroparesis associated with type 1 diabetes mellitus (HCC)   Tachycardia   Diabetic gastroparesis ([place])   Uncontrolled type 1 diabetes mellitus with hyperglycemia, with long-term current use of insulin (HCC)   Acute renal failure superimposed on chronic kidney disease ([place])   Current smoker   Intractable  vomiting with nausea   Renal failure (ARF), acute on chronic (HCC)   Cellulitis   Hydrocele   DKA (diabetic ketoacidosis) ([place])   Epididymitis    Discharge instructions    Discharge Instructions    Discharge instructions   Complete by:  As directed    Follow with Primary MD Jerene Bears B, MD in 7 days   Get CBC, CMP, 2 view Chest X ray checked  by Primary MD  in 5-7 days    Activity: As tolerated with Full fall precautions use walker/cane & assistance as needed.  Keep your wound clean and dry at all times.  Do daily dressing changes as instructed in the hospital.  Disposition Home    Diet: Heart Healthy Low Carb.  Check CBGs QA CHS  Special Instructions: If you have smoked or chewed Tobacco  in the last 2 yrs please stop smoking, stop any regular Alcohol  and or any Recreational drug use.  On your next visit with your primary care physician please Get Medicines reviewed and adjusted.  Please request your Prim.MD to go over all Hospital Tests and Procedure/Radiological results at the follow up, please get all Hospital records sent to your Prim MD by signing hospital release before you go home.  If you experience worsening of your admission symptoms, develop shortness of breath, life threatening emergency, suicidal or homicidal thoughts you must seek medical attention immediately by calling 911 or calling your MD immediately  if symptoms less severe.  You Must read complete instructions/literature along with all the possible adverse reactions/side effects for all the Medicines you take and that have been prescribed to you. Take any new Medicines after you have completely understood and accpet all the possible adverse reactions/side effects.   Do not drive, operate heavy machinery, perform activities at heights, swimming or participation in water activities or provide baby sitting services if your were admitted for syncope or siezures until you have seen by Primary MD or a Neurologist  and advised to do so again.  Do not drive when taking Pain medications. Do not take more than prescribed Pain, Sleep and Anxiety Medications  Please note  You were cared for by a hospitalist during your hospital stay. If you have any questions about your discharge medications or the care you received while you were in the hospital after you are discharged, you can call the unit and asked to speak with the hospitalist on call if the hospitalist that took care of you is not available. Once you are discharged, your primary care physician will handle any further medical issues. Please note that NO REFILLS for any discharge  medications will be authorized once you are discharged, as it is imperative that you return to your primary care physician (or establish a relationship with a primary care physician if you do not have one) for your aftercare needs so that they can reassess your need for medications and monitor your lab values.   Increase activity slowly   Complete by:  As directed       Discharge Medications   Allergies as of 10/29/2017      Reactions   Nsaids Nausea And Vomiting   Tramadol Anaphylaxis, Other (See Comments), Nausea Only   Acid Reflux Acid Reflux   Ibuprofen Other (See Comments), Nausea And Vomiting   Stomach ulcers   Naproxen Other (See Comments)   Stomach ulcers Stomach ulcers   Sulfa Antibiotics Rash, Other (See Comments)   Stomach ulcers   Hydrocodone Bitartrate    Anaphylaxis Can take plain Tylenol   Ketorolac Other (See Comments)   Unknown      Medication List    TAKE these medications   amLODipine 10 MG tablet Commonly known as:  NORVASC Take 10 mg by mouth daily.   amoxicillin-clavulanate 500-125 MG tablet Commonly known as:  AUGMENTIN Take 1 tablet (500 mg total) by mouth 2 (two) times daily.   BASAGLAR KWIKPEN 100 UNIT/ML Sopn Inject 6 Units into the skin 2 (two) times daily.   BAYER MICROLET LANCETS lancets Use as instructed to test sugar 5  times daily   Buprenorphine HCl-Naloxone HCl 8-2 MG Film Place 1 Film under the tongue 2 (two) times daily.   calcitRIOL 0.25 MCG capsule Commonly known as:  ROCALTROL Take 1 capsule (0.25 mcg total) by mouth daily.   calcium acetate 667 MG capsule Commonly known as:  PHOSLO Take 667 mg by mouth 3 (three) times daily with meals.   carvedilol 25 MG tablet Commonly known as:  COREG Take 1 tablet (25 mg total) by mouth 2 (two) times daily with a meal.   cloNIDine 0.3 mg/24hr patch Commonly known as:  CATAPRES - Dosed in mg/24 hr Place 1 patch (0.3 mg total) onto the skin once a week. Change on Friday's   collagenase ointment Commonly known as:  SANTYL Apply topically daily. Start taking on:  10/30/2017   Dressing Sponges 4"X4" Pads Provide 1 month supply for daily dressing - Kerlix 4.5 inch-wide roll gauze(s)  Cover with dry dressing (gauze or ABD pads) & tape, daily change   FREESTYLE LIBRE 14 DAY READER Devi 1 each by Does not apply route every 14 (fourteen) days.   furosemide 40 MG tablet Commonly known as:  LASIX Take 40 mg by mouth daily.   gabapentin 300 MG capsule Commonly known as:  NEURONTIN Take 1 capsule (300 mg total) by mouth at bedtime.   glucose blood test strip Use as instructed to test sugar 5 times daily   insulin lispro 100 UNIT/ML KiwkPen Commonly known as:  HUMALOG 10 units ac tid What changed:    how much to take  how to take this  when to take this  additional instructions   Insulin Pen Needle 31G X 8 MM Misc To use with Basaglar and Humalog pens, 4 times daily   oxyCODONE 5 MG immediate release tablet Commonly known as:  Oxy IR/ROXICODONE Take 1 tablet (5 mg total) by mouth every 6 (six) hours as needed for moderate pain or severe pain.   pantoprazole 40 MG tablet Commonly known as:  PROTONIX Take 1 tablet (40 mg total) by mouth  2 (two) times daily before a meal. What changed:  when to take this   promethazine 25 MG  tablet Commonly known as:  PHENERGAN Take 25 mg by mouth 3 (three) times daily as needed for nausea or vomiting.   tiZANidine 4 MG tablet Commonly known as:  ZANAFLEX Take 2 mg by mouth every 6 (six) hours as needed for muscle spasms.       Follow-up Information    [facility] [place]. Schedule an appointment as soon as possible for a visit in 1 week(s).   Specialty:  Urology Why:  For wound check Contact information: [address] [place]       Glenda Chroman, MD. Schedule an appointment as soon as possible for a visit in 1 week(s).   Specialty:  Internal Medicine Contact information: [place]  [address] [phone]           Major procedures and Radiology Reports - PLEASE review detailed and final reports thoroughly  -     9/14>>Scrotal exploration +washout, Debridement of devitalized tissue  9/15 - 2nd washout     Ct Abdomen Pelvis Wo Contrast  Result Date: 10/16/2017 CLINICAL DATA:  Four-day history of pain, erythema and swelling involving the RIGHT hemiscrotum, associated with fever. Current history of end-stage renal disease for which the patient had a an AV fistula placed on 04/09/2017. Surgical history includes cholecystectomy. EXAM: CT ABDOMEN AND PELVIS WITHOUT CONTRAST TECHNIQUE: Multidetector CT imaging of the abdomen and pelvis was performed following the standard protocol without IV contrast. COMPARISON:  07/10/2017, 05/29/2017 and earlier. FINDINGS: Lower chest: Minimal linear scar/atelectasis in the lingula and both lower lobes. Visualized lung bases otherwise clear. Resolution of RIGHT LOWER LOBE pneumonia since 07/10/2017. Heart mildly enlarged with RIGHT coronary artery atherosclerosis. Hepatobiliary: Normal unenhanced appearance of the liver. Surgically absent gallbladder. No unexpected biliary ductal dilation. Pancreas: Normal unenhanced appearance. Spleen: Normal unenhanced  appearance. Adrenals/Urinary Tract: Normal appearing adrenal glands. No evidence of urinary tract calculi or obstruction on either side. Within the limits of the unenhanced technique, no focal parenchymal abnormality involving either kidney. Normal appearing urinary bladder. Stomach/Bowel: Thickening of the wall of the distal esophagus to the level of the EG junction as noted previously. Normal appearing decompressed stomach. Normal-appearing small bowel. Normal-appearing colon with expected stool burden. Normal appendix in the RIGHT UPPER pelvis. Vascular/Lymphatic: Moderate aorto-iliofemoral atherosclerosis without evidence of aneurysm. Enlarged LEFT periaortic retroperitoneal lymph nodes, the largest node measuring approximately 2.3 x 2.1 cm (series 2, image 34), increased in size since the most recent prior CT 07/10/2017 where it measured 1.8 x 1.7 cm at the same level. Multiple upper normal size to slightly enlarged BILATERAL superficial inguinal lymph nodes, increased in size since the most recent prior CT. Reproductive: Prostate gland and seminal vesicles normal in size and appearance for age. Extensive edema/inflammation involving the RIGHT hemiscrotum with marked skin thickening and a likely fluid collection within the thickened skin measuring approximately 1.6 cm (series 3, image 18). Mild edema/inflammation involving the contralateral LEFT hemiscrotum. Large BILATERAL hydroceles. No evidence of gas within the scrotal tissues. Other: None. Musculoskeletal: Large Schmorl's node in the UPPER endplate of O24 with mild compression deformity of the UPPER endplate of M35, unchanged. No new/acute findings. IMPRESSION: 1. Edema/inflammation involving the RIGHT hemiscrotum with marked skin thickening and a likely abscess within the thickened skin. Edema/inflammation to a lesser degree involves the contralateral LEFT hemiscrotum. Large BILATERAL hydroceles are present. No evidence  of gas within the scrotal tissues  to suggest Fournier's gangrene. Scrotal ultrasound is recommended in further evaluation to better delineate these findings. 2. Enlarging LEFT periaortic lymph nodes and BILATERAL superficial inguinal lymph nodes since the most recent prior CT 3 months ago. While these are statistically likely reactive, a low-grade lymphoproliferative disorder could have a similar appearance. 3. Stable marked thickening of the wall of the visualized distal esophagus to the level of the EG junction dating back to at least 2017, likely chronic esophagitis. 4.  Aortic Atherosclerosis, advanced for patient age.  (ICD10-170.0) Electronically Signed   By: Evangeline Dakin M.D.   On: 10/16/2017 15:10   US Scrotum W/doppler  Result Date: 10/16/2017 CLINICAL DATA:  Right-sided pain with edema and redness over the last 4 days EXAM: SCROTAL ULTRASOUND DOPPLER ULTRASOUND OF THE TESTICLES TECHNIQUE: Complete ultrasound examination of the testicles, epididymis, and other scrotal structures was performed. Color and spectral Doppler ultrasound were also utilized to evaluate blood flow to the testicles. COMPARISON:  CT abdomen pelvis of 10/16/2016 FINDINGS: Right testicle Measurements: The right testicle measures 3.9 x 2.4 x 2.6 cm. No intratesticular abnormality is seen. There is blood flow to the right testicle with arterial and venous waveforms. Left testicle Measurements: The left testicle measures 4.2 x 2.7 x 2.6 cm. No intratesticular abnormality is seen. Blood flow is demonstrated to the left testicle with arterial and venous waveforms. Right epididymis:  The right epididymis is unremarkable. Left epididymis: The left epididymis appears enlarged and inhomogeneous as well as hypervascular suggesting left epididymitis. Hydrocele: There is a small right hydrocele present which appears partially septated. Varicocele:  No varicocele is seen. Pulsed Doppler interrogation of both testes demonstrates normal low resistance arterial and venous  waveforms bilaterally. There is diffuse scrotal edema and skin thickening present. No discrete abscess is detected. IMPRESSION: 1. Diffusely edematous scrotal wall.  No abscess. 2. Irregular and enlarged left epididymis with hypervascularity suggestive of left epididymitis. 3. No intratesticular abnormality is seen. Blood flow is demonstrated to both testicles with arterial and venous waveforms. 4. Small right hydrocele which appears septated. Electronically Signed   By: Ivar Drape M.D.   On: 10/16/2017 16:22    Micro Results    Recent Results (from the past 240 hour(s))  Aerobic/Anaerobic Culture (surgical/deep wound)     Status: None   Collection Time: 10/19/17 11:27 PM  Result Value Ref Range Status   Specimen Description ABSCESS SCROTUM  Final   Special Requests PATIENT ON FOLLOWING VANC DOXYCYCLINE CEFEPIME  Final   Gram Stain   Final    ABUNDANT WBC PRESENT, PREDOMINANTLY PMN ABUNDANT GRAM NEGATIVE RODS ABUNDANT GRAM POSITIVE COCCI Performed at [facility], [place] [address] [place], [place] [address]    Culture   Final    MODERATE ENTEROCOCCUS AVIUM MIXED ANAEROBIC FLORA PRESENT.  CALL LAB IF FURTHER IID REQUIRED.    Report Status 10/23/2017 FINAL  Final   Organism ID, Bacteria ENTEROCOCCUS AVIUM  Final      Susceptibility   Enterococcus avium - MIC*    AMPICILLIN <=2 SENSITIVE Sensitive     VANCOMYCIN <=0.5 SENSITIVE Sensitive     GENTAMICIN SYNERGY SENSITIVE Sensitive     * MODERATE ENTEROCOCCUS AVIUM  Surgical pcr screen     Status: Abnormal   Collection Time: 10/20/17 10:21 PM  Result Value Ref Range Status   MRSA, PCR NEGATIVE NEGATIVE Final   Staphylococcus aureus POSITIVE (A) NEGATIVE Final    Comment: RESULT CALLED TO, READ BACK  BY AND VERIFIED WITH: RN Orvan Seen [phone] @0151  THANEY Performed at [facility] [address], [place], [place] [address]     Today   Subjective    Noe Goyer today has no headache,no chest abdominal pain,no  new weakness tingling or numbness, feels much better wants to go home today.     Objective   Blood pressure (!) 168/78, pulse 81, temperature 99 F (37.2 C), temperature source Oral, resp. rate 20, height 6' 2.4" (1.89 m), weight 89.3 kg, SpO2 98 %.   Intake/Output Summary (Last 24 hours) at 10/29/2017 1625 Last data filed at 10/29/2017 1300 Gross per 24 hour  Intake -  Output 1300 ml  Net -1300 ml    Exam Awake Alert, Oriented x 3, No new F.N deficits, Normal affect [place].AT,PERRAL Supple Neck,No JVD, No cervical lymphadenopathy appriciated.  Symmetrical Chest wall movement, Good air movement bilaterally, CTAB RRR,No Gallops,Rubs or new Murmurs, No Parasternal Heave +ve B.Sounds, Abd Soft, Non tender, No organomegaly appriciated, No rebound -guarding or rigidity. No Cyanosis, Clubbing or edema, No new Rash or bruise, scrotal wound under bandage   Data Review   CBC w Diff:  Lab Results  Component Value Date   WBC 15.8 (H) 10/29/2017   HGB 8.3 (L) 10/29/2017   HCT 26.3 (L) 10/29/2017   PLT 541 (H) 10/29/2017   LYMPHOPCT 4 10/16/2017   BANDSPCT 2 04/07/2007   MONOPCT 7 10/16/2017   EOSPCT 0 10/16/2017   BASOPCT 0 10/16/2017    CMP:  Lab Results  Component Value Date   NA 135 10/29/2017   K 4.4 10/29/2017   CL 100 10/29/2017   CO2 23 10/29/2017   BUN 36 (H) 10/29/2017   CREATININE 4.79 (H) 10/29/2017   PROT 5.5 (L) 10/25/2017   ALBUMIN 2.0 (L) 10/25/2017   BILITOT 0.5 10/25/2017   ALKPHOS 75 10/25/2017   AST 11 (L) 10/25/2017   ALT 8 10/25/2017  . Lab Results  Component Value Date   HGBA1C 8.1 (A) 09/19/2017     Total Time in preparing paper work, data evaluation and todays exam - 48 minutes  Lala Lund M.D on 10/29/2017 at 4:25 PM  [facility]   Office  [phone]

## 2017-10-29 NOTE — Progress Notes (Signed)
Inpatient Diabetes Program Recommendations  AACE/ADA: New Consensus Statement on Inpatient Glycemic Control (2015)  Target Ranges:  Prepandial:   less than 140 mg/dL      Peak postprandial:   less than 180 mg/dL (1-2 hours)      Critically ill patients:  140 - 180 mg/dL   Lab Results  Component Value Date   GLUCAP 327 (H) 10/29/2017   HGBA1C 8.1 (A) 09/19/2017    Review of Glycemic ControlResults for Brent Daniels, Brent Daniels (MRN [id number]) as of 10/29/2017 13:22  Ref. Range 10/28/2017 12:33 10/28/2017 17:01 10/28/2017 21:14 10/29/2017 07:52 10/29/2017 11:54  Glucose-Capillary Latest Ref Range: 70 - 99 mg/dL 237 (H) 323 (H) 225 (H) 387 (H) 327 (H)   Outpatient Diabetes medications: Basaglar 6 units bid, Humalog 1-15 units tid with meals Current orders for Inpatient glycemic control:  Novolog sensitive tid with meals and HS, Lantus 30 units daily Inpatient Diabetes Program Recommendations:  Note that patient refused both Lantus and Novolog on 9/22.  Blood sugars should improve since Lantus given today.  Will follow.   Thanks,  Adah Perl, RN, BC-ADM Inpatient Diabetes Coordinator Pager [phone] (8a-5p)

## 2017-11-04 ENCOUNTER — Other Ambulatory Visit: Payer: Self-pay | Admitting: Endocrinology

## 2017-11-04 DIAGNOSIS — E1065 Type 1 diabetes mellitus with hyperglycemia: Secondary | ICD-10-CM

## 2017-11-05 ENCOUNTER — Other Ambulatory Visit: Payer: Self-pay

## 2017-11-05 ENCOUNTER — Ambulatory Visit: Payer: Self-pay | Admitting: Endocrinology

## 2017-11-06 ENCOUNTER — Ambulatory Visit: Payer: Self-pay | Admitting: Gastroenterology

## 2017-11-13 ENCOUNTER — Encounter (HOSPITAL_BASED_OUTPATIENT_CLINIC_OR_DEPARTMENT_OTHER): Payer: Managed Care, Other (non HMO) | Attending: Internal Medicine

## 2017-12-12 ENCOUNTER — Other Ambulatory Visit: Payer: Self-pay

## 2017-12-12 ENCOUNTER — Telehealth: Payer: Self-pay | Admitting: Endocrinology

## 2017-12-12 MED ORDER — GLUCOSE BLOOD VI STRP: ORAL_STRIP | 2 refills | Status: DC

## 2017-12-12 NOTE — Telephone Encounter (Signed)
Patient called stating Pharmacy has been trying to reach [place] re: RX for One Touch Verio test strips with no response. Patient is out of One Touch Verio test strips. Please send RX for the above Test strips to [facility]

## 2017-12-12 NOTE — Telephone Encounter (Signed)
This has been sent

## 2017-12-27 NOTE — Progress Notes (Deleted)
Patient ID: Brent Daniels, male   DOB: [birth year], 32 y.o.   MRN: [id number]           Reason for Appointment : Consultation for Type 1 Diabetes  History of Present Illness          Diagnosis: Type 1 diabetes mellitus, date of diagnosis: At age 18         Previous history:   He has been on insulin and various regimens since diagnosis He was also on an insulin pump with [employer] for up to 7 or 8 years but this is over 10 years ago He has not seen an endocrinologist for probably 10 years or more He has generally been followed by his PCP No previous records are available and previous A1c results range from 7.6 up to 9.7 last year  Recent history:     INSULIN regimen is: Lantus 10 units at bedtime daily, regular insulin correction doses 1: 30 factor, carbohydrate coverage 1:8   His A1c today is 8.1, previously 7.6 in 2018  Current management, blood sugar patterns and problems identified:    He is taking regular insulin from [place] and is usually injecting this when he is starting to eat  He says he is covering his carbohydrates with a 1: 8 ratio and will add 1 unit per 30 mg for high sugars  Difficult to identify his blood sugar patterns as his meter could not be downloaded in his meter has the wrong date and time programmed   His recent blood sugars are mostly high with relatively high fasting readings and some high readings later in the day, not clear if he checks readings after evening meal also  Today he had a low blood sugar of 52 after taking a correction dose of 5 units for a blood sugar of 248 on waking up  He does not think he has frequent low blood sugars but may occur a couple of hours after eating sometimes and he thinks this is from gastroparesis  His food intake is variable because of problems with nausea  He has had a history of ketoacidosis admitted in 4/19 but also several admissions for gastroparesis   Glucose monitoring:  is being done 3-6 times a day          Glucometer:  [employer].      Blood Glucose readings from meter review  Blood sugar range recently 52-389 with 30-day average 247  His meter is programmed to the wrong date and time and unable to get a blood sugar pattern identified   Hypoglycemia:  occurs occasionally in the afternoon Factors causing hypoglycemia: Excessive amount of mealtime insulin Symptoms of hypoglycemia: Feeling hungry, shaky  Treatment of hypoglycemia: Usually juice         Self-care: The diet that the patient has been following is: Counting carbohydrates  For breakfast will usually have eggs and toast, occasionally will skip lunch, sometimes will have only soup for a meal                Dietician consultation: Most recent: Several years ago.    Wt Readings from Last 3 Encounters:  10/28/17 196 lb 13.9 oz (89.3 kg)  09/19/17 204 lb (92.5 kg)  06/12/17 198 lb (89.8 kg)          Diabetes labs:  Lab Results  Component Value Date   HGBA1C 8.1 (A) 09/19/2017   HGBA1C 7.6 (H) 11/16/2016   HGBA1C 9.7 (H) 08/24/2016   Lab Results  Component Value Date   LDLCALC (H) 07/06/2009    110        Total Cholesterol/HDL:CHD Risk Coronary Heart Disease Risk Table                     Men   Women  1/2 Average Risk   3.4   3.3  Average Risk       5.0   4.4  2 X Average Risk   9.6   7.1  3 X Average Risk  23.4   11.0        Use the calculated Patient Ratio above and the CHD Risk Table to determine the patient's CHD Risk.        ATP III CLASSIFICATION (LDL):  <100     mg/dL   Optimal  100-129  mg/dL   Near or Above                    Optimal  130-159  mg/dL   Borderline  160-189  mg/dL   High  >190     mg/dL   Very High   CREATININE 4.79 (H) 10/29/2017    No results found for: MICRALBCREAT   Allergies as of 12/28/2017      Reactions   Nsaids Nausea And Vomiting   Tramadol Anaphylaxis, Other (See Comments), Nausea Only   Acid Reflux Acid Reflux   Ibuprofen Other (See Comments), Nausea And  Vomiting   Stomach ulcers   Naproxen Other (See Comments)   Stomach ulcers Stomach ulcers   Sulfa Antibiotics Rash, Other (See Comments)   Stomach ulcers   Hydrocodone Bitartrate    Anaphylaxis Can take plain Tylenol   Ketorolac Other (See Comments)   Unknown      Medication List        Accurate as of 12/27/17  9:29 PM. Always use your most recent med list.          amLODipine 10 MG tablet Commonly known as:  NORVASC Take 10 mg by mouth daily.   amoxicillin-clavulanate 500-125 MG tablet Commonly known as:  AUGMENTIN Take 1 tablet (500 mg total) by mouth 2 (two) times daily.   BASAGLAR KWIKPEN 100 UNIT/ML Sopn Inject 6 Units into the skin 2 (two) times daily.   BAYER MICROLET LANCETS lancets Use as instructed to test sugar 5 times daily   Buprenorphine HCl-Naloxone HCl 8-2 MG Film Place 1 Film under the tongue 2 (two) times daily.   calcitRIOL 0.25 MCG capsule Commonly known as:  ROCALTROL Take 1 capsule (0.25 mcg total) by mouth daily.   calcium acetate 667 MG capsule Commonly known as:  PHOSLO Take 667 mg by mouth 3 (three) times daily with meals.   carvedilol 25 MG tablet Commonly known as:  COREG Take 1 tablet (25 mg total) by mouth 2 (two) times daily with a meal.   cloNIDine 0.3 mg/24hr patch Commonly known as:  CATAPRES - Dosed in mg/24 hr Place 1 patch (0.3 mg total) onto the skin once a week. Change on Friday's   collagenase ointment Commonly known as:  SANTYL Apply topically daily.   Dressing Sponges 4"X4" Pads Provide 1 month supply for daily dressing - Kerlix 4.5 inch-wide roll gauze(s)  Cover with dry dressing (gauze or ABD pads) & tape, daily change   FREESTYLE LIBRE 14 DAY READER Devi 1 each by Does not apply route every 14 (fourteen) days.   furosemide 40 MG tablet Commonly known as:  LASIX Take  40 mg by mouth daily.   gabapentin 300 MG capsule Commonly known as:  NEURONTIN Take 1 capsule (300 mg total) by mouth at bedtime.     glucose blood test strip Use as instructed to test sugar 5 times daily   glucose blood test strip Use to test blood sugar two times daily   insulin lispro 100 UNIT/ML KiwkPen Commonly known as:  HUMALOG 10 units ac tid   Insulin Pen Needle 31G X 8 MM Misc To use with Basaglar and Humalog pens, 4 times daily   oxyCODONE 5 MG immediate release tablet Commonly known as:  Oxy IR/ROXICODONE Take 1 tablet (5 mg total) by mouth every 6 (six) hours as needed for moderate pain or severe pain.   pantoprazole 40 MG tablet Commonly known as:  PROTONIX Take 1 tablet (40 mg total) by mouth 2 (two) times daily before a meal.   promethazine 25 MG tablet Commonly known as:  PHENERGAN Take 25 mg by mouth 3 (three) times daily as needed for nausea or vomiting.   tiZANidine 4 MG tablet Commonly known as:  ZANAFLEX Take 2 mg by mouth every 6 (six) hours as needed for muscle spasms.       Allergies:  Allergies  Allergen Reactions  . Nsaids Nausea And Vomiting  . Tramadol Anaphylaxis, Other (See Comments) and Nausea Only    Acid Reflux Acid Reflux  . Ibuprofen Other (See Comments) and Nausea And Vomiting    Stomach ulcers  . Naproxen Other (See Comments)    Stomach ulcers Stomach ulcers  . Sulfa Antibiotics Rash and Other (See Comments)    Stomach ulcers   . Hydrocodone Bitartrate     Anaphylaxis Can take plain Tylenol  . Ketorolac Other (See Comments)    Unknown    Past Medical History:  Diagnosis Date  . Acute esophagitis   . Anemia   . Anxiety   . Arthritis   . Bicuspid aortic valve    noted on 10/20/16 [facility])  . Chronic abdominal pain   . Chronic back pain   . Chronic kidney disease    Stage 5 Kidney disease  . Depression   . Diabetes mellitus    type 1  . Diabetic gastroparesis associated with type 1 diabetes mellitus ([place])   . Erosive esophagitis 12/23/2013   "severe" per EGD   . Gastroparesis   . GERD (gastroesophageal reflux disease)   . Headache     in the past  . Heart murmur    mild TR, mild MR, mild AS 02/19/17  . History of hiatal hernia   . Hypercholesteremia   . Hypertension   . Insomnia   . Nausea and vomiting    chronic, recurrent  . Neuropathy   . Pneumonia   . PUD (peptic ulcer disease)   . S/P arthroscopic knee surgery 07/04/2011  . Scoliosis 07/11/2012    Past Surgical History:  Procedure Laterality Date  . AV FISTULA PLACEMENT Right 04/09/2017   Procedure: ARTERIOVENOUS (AV) FISTULA CREATION RIGHT ARM;  Surgeon: Rosetta Posner, MD;  Location: [facility];  Service: Vascular;  Laterality: Right;  . BIOPSY  06/17/2013   Procedure: GASTRIC ULCER AND ANTRAL BIOPSIES; ESOPHAGEAL BRUSHING;  Surgeon: Danie Binder, MD;  Location: [facility];  Service: Endoscopy;;  . BIOPSY N/A 12/23/2013   Procedure: ESOPHAGEAL BIOPSY;  Surgeon: Daneil Dolin, MD;  Location: [facility];  Service: Endoscopy;  Laterality: N/A;  . CHOLECYSTECTOMY N/A 01/28/2014   Procedure: LAPAROSCOPIC CHOLECYSTECTOMY;  Surgeon: Jamesetta So, MD;  Location: [facility];  Service: General;  Laterality: N/A;  . ESOPHAGOGASTRODUODENOSCOPY (EGD) WITH PROPOFOL  06/17/2013   Dr. Oneida Alar: two gastric ulcers in fundus, mild antral gastritis, candida esophagitis  . ESOPHAGOGASTRODUODENOSCOPY (EGD) WITH PROPOFOL N/A 09/11/2013   Dr. [name]: abnormal hypopharynx, question massively enlarged tonsils, stomach full of food precluded exam, needs EGD for verification of ulcer healing at a later date  . ESOPHAGOGASTRODUODENOSCOPY (EGD) WITH PROPOFOL N/A 12/23/2013   RMR: Severe exudative esophagitis likely predominatly reflux related. Superimposed Candida infection not excluded status post KOH brushing and biopsy. Localized excoriating gastric mucosa most consistant with trauma(vomiting). No evidence of peptic ulcer disease or other gastric/duodenal pathology. I suspect severe inflammation involving the distal esophagus may account  for at least some of patii  . INCISION AND DRAINAGE ABSCESS N/A  10/19/2017   Procedure: INCISION AND DRAINAGE ABSCESS SCROTAL EXPLORATION,WASHOUT, [place] OF TISSUE;  Surgeon: Ardis Hughs, MD;  Location: [facility];  Service: Urology;  Laterality: N/A;  . IRRIGATION AND DEBRIDEMENT ABSCESS N/A 10/21/2017   Procedure: IRRIGATION AND DEBRIDEMENT ABSCESS;  Surgeon: Ardis Hughs, MD;  Location: [facility];  Service: Urology;  Laterality: N/A;  . KNEE ARTHROSCOPY  06/30/2011   Procedure: ARTHROSCOPY KNEE;  Surgeon: Carole Civil, MD;  Location: [facility];  Service: Orthopedics;  Laterality: Right;  diagnostic arthroscopy  . TYMPANOSTOMY TUBE PLACEMENT      Family History  Problem Relation Age of Onset  . Cancer Father   . Alcohol abuse Father   . Diabetes Father   . Diabetes Paternal Grandfather   . Pseudochol deficiency Neg Hx   . Malignant hyperthermia Neg Hx   . Hypotension Neg Hx   . Anesthesia problems Neg Hx   . Colon cancer Neg Hx     Social History:  reports that he has been smoking cigarettes. He has a 2.50 pack-year smoking history. He quit smokeless tobacco use about 4 years ago. He reports that he does not drink alcohol or use drugs.      Review of Systems      Lipids: No results available  He has not had an eye exam for several years  He has been on antihypertensives from his nephrologist, using clonidine and Norvasc  For his CKD he is followed up by his nephrologist, currently not on dialysis  LABS:    Physical Examination:  There were no vitals taken for this visit.  GENERAL:  Averagely built and nourished  HEENT:         Eye exam shows normal external appearance. Fundus exam shows no retinopathy. Oral exam shows normal mucosa .  NECK:         there is no lymphadenopathy.   Thyroid is not enlarged and no nodules felt.    LUNGS:         Chest is symmetrical. Lungs are clear to auscultation.Marland Kitchen   HEART:         Heart sounds:  S1 and S2 are normal. No murmurs or clicks heard., no S3 or S4.   ABDOMEN:  no  distention present. Liver and spleen are not palpable. No other mass or tenderness present.  EXTREMITIES:     There is no edema. No skin lesions present but he has some thickening of of the skin around his feet and lower legs   NEUROLOGICAL:        Vibration sense is absent in toes.  Ankle jerks are absent bilaterally.  Diabetic Foot Exam - Simple   No data filed          MUSCULOSKELETAL:       There is no enlargement or deformity of the joints.  SKIN:       No rash, lesions or abnormal pigmentation       ASSESSMENT:  Diabetes type 1, long-standing with multiple complications and persistently poor control and history of ketoacidosis periodically  Recent A1c 8.1%  Problems identified:  Mostly high blood sugars as reviewed by his monitor but since his meter cannot be downloaded difficult to know what his blood sugar patterns are  Recent blood sugar average at home 247 for the last 30 days without frequent hypoglycemia  Most likely he is not getting 24 coverage with his once a day Lantus at night and also may not be getting adequate dose with higher fasting readings most of the time  Although he reports he is doing carbohydrate counting for all his meals not clear if he does not accurately  Also because of his gastroparesis his food intake is variable and inconsistent making mealtime coverage with insulin difficult  Currently taking slow acting regular insulin which may be occasionally causing late hypoglycemia  Has variability in blood sugars at home because of using regular insulin, gastroparesis, inconsistent schedule and food intake and renal failure  Complications: Nephropathy with advanced renal failure, peripheral neuropathy with sensory loss, likely has some retinopathy but no recent exams available.  Also has reportedly gastroparesis without adequate control of symptoms  Hypertension managed by her nephrologist, blood pressure relatively high today  Probable  depression  PLAN:    Split Lantus to twice a day  He will use 6 units twice a day and given him titration instructions to go up 1 unit every 3 to 4 days to keep before breakfast and suppertime readings at least under 150  Since he needs some more rapidly acting insulin he will switch Novolin R to NovoLog and he may be able to take this right after eating also.  Discussed differences between NovoLog and regular insulin  To change correction factor to 1: 40 instead of 30 to avoid overcorrection  Consider changing Lantus to [place] once he has finished his supply  Given new contour next glucose monitor and he will start checking blood sugar with this and will bring this for download on each visit  Need day-to-day management reviewed with diabetes educator  Given information on [employer] 670 pump when he can see if this can be affordable for him  Otherwise may consider at least Dexcom sensor if affordable  Because of his significant symptoms he should continue taking metoclopramide but can reduce it to twice a day and skip the dose when he is not eating a significant meal; he needs to check with his PCP about safety as literature does not indicate a high incidence of arrhythmias as indicated to him by other physicians.  Also needs to take this 30-minute before eating  Needs to schedule eye exam which is overdue  There are no Patient Instructions on file for this visit. Counseling time on subjects discussed in assessment and plan sections is over 50% of today's 60 minute visit   Elayne Snare 12/27/2017, 9:29 PM   Note: This note was prepared with Dragon voice recognition system technology. Any transcriptional errors that result from this process are unintentional.

## 2017-12-28 ENCOUNTER — Ambulatory Visit: Payer: Self-pay | Admitting: Endocrinology

## 2018-01-21 ENCOUNTER — Other Ambulatory Visit: Payer: Self-pay | Admitting: Endocrinology

## 2018-01-31 NOTE — Progress Notes (Deleted)
Patient ID: Brent Daniels, male   DOB: Apr 04, 1985, 32 y.o.   MRN: [id number]           Reason for Appointment : Consultation for Type 1 Diabetes  History of Present Illness          Diagnosis: Type 1 diabetes mellitus, date of diagnosis: At age 84         Previous history:   He has been on insulin and various regimens since diagnosis He was also on an insulin pump with [employer] for up to 7 or 8 years but this is over 10 years ago He has not seen an endocrinologist for probably 10 years or more He has generally been followed by his PCP No previous records are available and previous A1c results range from 7.6 up to 9.7 last year  Recent history:     INSULIN regimen is: Lantus 10 units at bedtime daily, regular insulin correction doses 1: 30 factor, carbohydrate coverage 1:8   His A1c today is 8.1, previously 7.6 in 2018  Current management, blood sugar patterns and problems identified:    He is taking regular insulin from [place] and is usually injecting this when he is starting to eat  He says he is covering his carbohydrates with a 1: 8 ratio and will add 1 unit per 30 mg for high sugars  Difficult to identify his blood sugar patterns as his meter could not be downloaded in his meter has the wrong date and time programmed   His recent blood sugars are mostly high with relatively high fasting readings and some high readings later in the day, not clear if he checks readings after evening meal also  Today he had a low blood sugar of 52 after taking a correction dose of 5 units for a blood sugar of 248 on waking up  He does not think he has frequent low blood sugars but may occur a couple of hours after eating sometimes and he thinks this is from gastroparesis  His food intake is variable because of problems with nausea  He has had a history of ketoacidosis admitted in 4/19 but also several admissions for gastroparesis   Glucose monitoring:  is being done 3-6 times a day          Glucometer:  [employer].      Blood Glucose readings from meter review  Blood sugar range recently 52-389 with 30-day average 247  His meter is programmed to the wrong date and time and unable to get a blood sugar pattern identified   Hypoglycemia:  occurs occasionally in the afternoon Factors causing hypoglycemia: Excessive amount of mealtime insulin Symptoms of hypoglycemia: Feeling hungry, shaky  Treatment of hypoglycemia: Usually juice         Self-care: The diet that the patient has been following is: Counting carbohydrates  For breakfast will usually have eggs and toast, occasionally will skip lunch, sometimes will have only soup for a meal                Dietician consultation: Most recent: Several years ago.    Wt Readings from Last 3 Encounters:  10/28/17 196 lb 13.9 oz (89.3 kg)  09/19/17 204 lb (92.5 kg)  06/12/17 198 lb (89.8 kg)          Diabetes labs:  Lab Results  Component Value Date   HGBA1C 8.1 (A) 09/19/2017   HGBA1C 7.6 (H) 11/16/2016   HGBA1C 9.7 (H) 08/24/2016   Lab Results  Component Value Date   LDLCALC (H) 07/06/2009    110        Total Cholesterol/HDL:CHD Risk Coronary Heart Disease Risk Table                     Men   Women  1/2 Average Risk   3.4   3.3  Average Risk       5.0   4.4  2 X Average Risk   9.6   7.1  3 X Average Risk  23.4   11.0        Use the calculated Patient Ratio above and the CHD Risk Table to determine the patient's CHD Risk.        ATP III CLASSIFICATION (LDL):  <100     mg/dL   Optimal  100-129  mg/dL   Near or Above                    Optimal  130-159  mg/dL   Borderline  160-189  mg/dL   High  >190     mg/dL   Very High   CREATININE 4.79 (H) 10/29/2017    No results found for: MICRALBCREAT   Allergies as of 02/01/2018      Reactions   Nsaids Nausea And Vomiting   Tramadol Anaphylaxis, Other (See Comments), Nausea Only   Acid Reflux Acid Reflux   Ibuprofen Other (See Comments), Nausea And  Vomiting   Stomach ulcers   Naproxen Other (See Comments)   Stomach ulcers Stomach ulcers   Sulfa Antibiotics Rash, Other (See Comments)   Stomach ulcers   Hydrocodone Bitartrate    Anaphylaxis Can take plain Tylenol   Ketorolac Other (See Comments)   Unknown      Medication List       Accurate as of January 31, 2018  4:53 PM. Always use your most recent med list.        amLODipine 10 MG tablet Commonly known as:  NORVASC Take 10 mg by mouth daily.   amoxicillin-clavulanate 500-125 MG tablet Commonly known as:  AUGMENTIN Take 1 tablet (500 mg total) by mouth 2 (two) times daily.   BASAGLAR KWIKPEN 100 UNIT/ML Sopn Inject 6 Units into the skin 2 (two) times daily.   BAYER MICROLET LANCETS lancets Use as instructed to test sugar 5 times daily   Buprenorphine HCl-Naloxone HCl 8-2 MG Film Place 1 Film under the tongue 2 (two) times daily.   calcitRIOL 0.25 MCG capsule Commonly known as:  ROCALTROL Take 1 capsule (0.25 mcg total) by mouth daily.   calcium acetate 667 MG capsule Commonly known as:  PHOSLO Take 667 mg by mouth 3 (three) times daily with meals.   carvedilol 25 MG tablet Commonly known as:  COREG Take 1 tablet (25 mg total) by mouth 2 (two) times daily with a meal.   cloNIDine 0.3 mg/24hr patch Commonly known as:  CATAPRES - Dosed in mg/24 hr Place 1 patch (0.3 mg total) onto the skin once a week. Change on Friday's   collagenase ointment Commonly known as:  SANTYL Apply topically daily.   Dressing Sponges 4"X4" Pads Provide 1 month supply for daily dressing - Kerlix 4.5 inch-wide roll gauze(s)  Cover with dry dressing (gauze or ABD pads) & tape, daily change   FREESTYLE LIBRE 14 DAY READER Devi 1 each by Does not apply route every 14 (fourteen) days.   furosemide 40 MG tablet Commonly known as:  LASIX Take 40  mg by mouth daily.   gabapentin 300 MG capsule Commonly known as:  NEURONTIN Take 1 capsule (300 mg total) by mouth at bedtime.     glucose blood test strip Commonly known as:  CONTOUR NEXT TEST Use as instructed to test sugar 5 times daily   glucose blood test strip Commonly known as:  ONETOUCH VERIO Use to test blood sugar two times daily   insulin lispro 100 UNIT/ML KwikPen Commonly known as:  HUMALOG KWIKPEN INJECT 10 UNITS INTO THE SKIN THREE TIMES DAILY BEFORE MEALS (this is a 50 DAY supply)   Insulin Pen Needle 31G X 8 MM Misc Commonly known as:  CARETOUCH PEN NEEDLES To use with Basaglar and Humalog pens, 4 times daily   oxyCODONE 5 MG immediate release tablet Commonly known as:  Oxy IR/ROXICODONE Take 1 tablet (5 mg total) by mouth every 6 (six) hours as needed for moderate pain or severe pain.   pantoprazole 40 MG tablet Commonly known as:  PROTONIX Take 1 tablet (40 mg total) by mouth 2 (two) times daily before a meal.   promethazine 25 MG tablet Commonly known as:  PHENERGAN Take 25 mg by mouth 3 (three) times daily as needed for nausea or vomiting.   tiZANidine 4 MG tablet Commonly known as:  ZANAFLEX Take 2 mg by mouth every 6 (six) hours as needed for muscle spasms.       Allergies:  Allergies  Allergen Reactions  . Nsaids Nausea And Vomiting  . Tramadol Anaphylaxis, Other (See Comments) and Nausea Only    Acid Reflux Acid Reflux  . Ibuprofen Other (See Comments) and Nausea And Vomiting    Stomach ulcers  . Naproxen Other (See Comments)    Stomach ulcers Stomach ulcers  . Sulfa Antibiotics Rash and Other (See Comments)    Stomach ulcers   . Hydrocodone Bitartrate     Anaphylaxis Can take plain Tylenol  . Ketorolac Other (See Comments)    Unknown    Past Medical History:  Diagnosis Date  . Acute esophagitis   . Anemia   . Anxiety   . Arthritis   . Bicuspid aortic valve    noted on 10/20/16 [facility]  . Chronic abdominal pain   . Chronic back pain   . Chronic kidney disease    Stage 5 Kidney disease  . Depression   . Diabetes mellitus    type 1  . Diabetic  gastroparesis associated with type 1 diabetes mellitus ([place])   . Erosive esophagitis 12/23/2013   "severe" per EGD   . Gastroparesis   . GERD (gastroesophageal reflux disease)   . Headache    in the past  . Heart murmur    mild TR, mild MR, mild AS 02/19/17  . History of hiatal hernia   . Hypercholesteremia   . Hypertension   . Insomnia   . Nausea and vomiting    chronic, recurrent  . Neuropathy   . Pneumonia   . PUD (peptic ulcer disease)   . S/P arthroscopic knee surgery 07/04/2011  . Scoliosis 07/11/2012    Past Surgical History:  Procedure Laterality Date  . AV FISTULA PLACEMENT Right 04/09/2017   Procedure: ARTERIOVENOUS (AV) FISTULA CREATION RIGHT ARM;  Surgeon: Rosetta Posner, MD;  Location: [place];  Service: Vascular;  Laterality: Right;  . BIOPSY  06/17/2013   Procedure: GASTRIC ULCER AND ANTRAL BIOPSIES; ESOPHAGEAL BRUSHING;  Surgeon: Danie Binder, MD;  Location: [facility];  Service: Endoscopy;;  . BIOPSY  N/A 12/23/2013   Procedure: ESOPHAGEAL BIOPSY;  Surgeon: Daneil Dolin, MD;  Location: [facility];  Service: Endoscopy;  Laterality: N/A;  . CHOLECYSTECTOMY N/A 01/28/2014   Procedure: LAPAROSCOPIC CHOLECYSTECTOMY;  Surgeon: Jamesetta So, MD;  Location: [facility];  Service: General;  Laterality: N/A;  . ESOPHAGOGASTRODUODENOSCOPY (EGD) WITH PROPOFOL  06/17/2013   Dr. Oneida Alar: two gastric ulcers in fundus, mild antral gastritis, candida esophagitis  . ESOPHAGOGASTRODUODENOSCOPY (EGD) WITH PROPOFOL N/A 09/11/2013   Dr. [name]: abnormal hypopharynx, question massively enlarged tonsils, stomach full of food precluded exam, needs EGD for verification of ulcer healing at a later date  . ESOPHAGOGASTRODUODENOSCOPY (EGD) WITH PROPOFOL N/A 12/23/2013   RMR: Severe exudative esophagitis likely predominatly reflux related. Superimposed Candida infection not excluded status post KOH brushing and biopsy. Localized excoriating gastric mucosa most consistant with trauma(vomiting). No evidence of peptic  ulcer disease or other gastric/duodenal pathology. I suspect severe inflammation involving the distal esophagus may account  for at least some of patii  . INCISION AND DRAINAGE ABSCESS N/A 10/19/2017   Procedure: INCISION AND DRAINAGE ABSCESS SCROTAL EXPLORATION,WASHOUT, [place] OF TISSUE;  Surgeon: Ardis Hughs, MD;  Location: [place];  Service: Urology;  Laterality: N/A;  . IRRIGATION AND DEBRIDEMENT ABSCESS N/A 10/21/2017   Procedure: IRRIGATION AND DEBRIDEMENT ABSCESS;  Surgeon: Ardis Hughs, MD;  Location: [place];  Service: Urology;  Laterality: N/A;  . KNEE ARTHROSCOPY  06/30/2011   Procedure: ARTHROSCOPY KNEE;  Surgeon: Carole Civil, MD;  Location: [facility];  Service: Orthopedics;  Laterality: Right;  diagnostic arthroscopy  . TYMPANOSTOMY TUBE PLACEMENT      Family History  Problem Relation Age of Onset  . Cancer Father   . Alcohol abuse Father   . Diabetes Father   . Diabetes Paternal Grandfather   . Pseudochol deficiency Neg Hx   . Malignant hyperthermia Neg Hx   . Hypotension Neg Hx   . Anesthesia problems Neg Hx   . Colon cancer Neg Hx     Social History:  reports that he has been smoking cigarettes. He has a 2.50 pack-year smoking history. He quit smokeless tobacco use about 4 years ago. He reports that he does not drink alcohol or use drugs.      Review of Systems      Lipids: No results available  He has not had an eye exam for several years  He has been on antihypertensives from his nephrologist, using clonidine and Norvasc  For his CKD he is followed up by his nephrologist, currently not on dialysis  LABS:    Physical Examination:  There were no vitals taken for this visit.  GENERAL:  Averagely built and nourished  HEENT:         Eye exam shows normal external appearance. Fundus exam shows no retinopathy. Oral exam shows normal mucosa .  NECK:         there is no lymphadenopathy.   Thyroid is not enlarged and no nodules felt.     LUNGS:         Chest is symmetrical. Lungs are clear to auscultation.Marland Kitchen   HEART:         Heart sounds:  S1 and S2 are normal. No murmurs or clicks heard., no S3 or S4.   ABDOMEN:  no distention present. Liver and spleen are not palpable. No other mass or tenderness present.  EXTREMITIES:     There is no edema. No skin lesions present but he has some thickening of  of the skin around his feet and lower legs   NEUROLOGICAL:        Vibration sense is absent in toes.  Ankle jerks are absent bilaterally.       Diabetic Foot Exam - Simple   No data filed          MUSCULOSKELETAL:       There is no enlargement or deformity of the joints.  SKIN:       No rash, lesions or abnormal pigmentation       ASSESSMENT:  Diabetes type 1, long-standing with multiple complications and persistently poor control and history of ketoacidosis periodically  Recent A1c 8.1%  Problems identified:  Mostly high blood sugars as reviewed by his monitor but since his meter cannot be downloaded difficult to know what his blood sugar patterns are  Recent blood sugar average at home 247 for the last 30 days without frequent hypoglycemia  Most likely he is not getting 24 coverage with his once a day Lantus at night and also may not be getting adequate dose with higher fasting readings most of the time  Although he reports he is doing carbohydrate counting for all his meals not clear if he does not accurately  Also because of his gastroparesis his food intake is variable and inconsistent making mealtime coverage with insulin difficult  Currently taking slow acting regular insulin which may be occasionally causing late hypoglycemia  Has variability in blood sugars at home because of using regular insulin, gastroparesis, inconsistent schedule and food intake and renal failure  Complications: Nephropathy with advanced renal failure, peripheral neuropathy with sensory loss, likely has some retinopathy but no recent  exams available.  Also has reportedly gastroparesis without adequate control of symptoms  Hypertension managed by her nephrologist, blood pressure relatively high today  Probable depression  PLAN:    Split Lantus to twice a day  He will use 6 units twice a day and given him titration instructions to go up 1 unit every 3 to 4 days to keep before breakfast and suppertime readings at least under 150  Since he needs some more rapidly acting insulin he will switch Novolin R to NovoLog and he may be able to take this right after eating also.  Discussed differences between NovoLog and regular insulin  To change correction factor to 1: 40 instead of 30 to avoid overcorrection  Consider changing Lantus to [place] once he has finished his supply  Given new contour next glucose monitor and he will start checking blood sugar with this and will bring this for download on each visit  Need day-to-day management reviewed with diabetes educator  Given information on [employer] 670 pump when he can see if this can be affordable for him  Otherwise may consider at least Dexcom sensor if affordable  Because of his significant symptoms he should continue taking metoclopramide but can reduce it to twice a day and skip the dose when he is not eating a significant meal; he needs to check with his PCP about safety as literature does not indicate a high incidence of arrhythmias as indicated to him by other physicians.  Also needs to take this 30-minute before eating  Needs to schedule eye exam which is overdue  There are no Patient Instructions on file for this visit. Counseling time on subjects discussed in assessment and plan sections is over 50% of today's 60 minute visit   Elayne Snare 01/31/2018, 4:53 PM   Note: This note was prepared  with Estate agent. Any transcriptional errors that result from this process are unintentional.

## 2018-02-01 ENCOUNTER — Ambulatory Visit: Payer: Self-pay | Admitting: Endocrinology

## 2018-02-01 DIAGNOSIS — Z0289 Encounter for other administrative examinations: Secondary | ICD-10-CM

## 2018-05-03 ENCOUNTER — Other Ambulatory Visit: Payer: Self-pay

## 2018-05-03 ENCOUNTER — Encounter (HOSPITAL_COMMUNITY): Payer: Self-pay | Admitting: Interventional Radiology

## 2018-05-03 ENCOUNTER — Other Ambulatory Visit (HOSPITAL_COMMUNITY): Payer: Self-pay | Admitting: Nephrology

## 2018-05-03 ENCOUNTER — Ambulatory Visit (HOSPITAL_COMMUNITY)
Admission: RE | Admit: 2018-05-03 | Discharge: 2018-05-03 | Disposition: A | Discharge status: Home / Self Care | Payer: Managed Care, Other (non HMO) | Source: Ambulatory Visit | Attending: Nephrology | Admitting: Nephrology

## 2018-05-03 ENCOUNTER — Other Ambulatory Visit: Payer: Self-pay | Admitting: Student

## 2018-05-03 DIAGNOSIS — E1043 Type 1 diabetes mellitus with diabetic autonomic (poly)neuropathy: Secondary | ICD-10-CM | POA: Insufficient documentation

## 2018-05-03 DIAGNOSIS — Z992 Dependence on renal dialysis: Secondary | ICD-10-CM | POA: Diagnosis not present

## 2018-05-03 DIAGNOSIS — F419 Anxiety disorder, unspecified: Secondary | ICD-10-CM | POA: Insufficient documentation

## 2018-05-03 DIAGNOSIS — E7800 Pure hypercholesterolemia, unspecified: Secondary | ICD-10-CM | POA: Insufficient documentation

## 2018-05-03 DIAGNOSIS — N186 End stage renal disease: Secondary | ICD-10-CM

## 2018-05-03 DIAGNOSIS — F329 Major depressive disorder, single episode, unspecified: Secondary | ICD-10-CM | POA: Diagnosis not present

## 2018-05-03 DIAGNOSIS — K219 Gastro-esophageal reflux disease without esophagitis: Secondary | ICD-10-CM | POA: Diagnosis not present

## 2018-05-03 DIAGNOSIS — K3184 Gastroparesis: Secondary | ICD-10-CM | POA: Diagnosis not present

## 2018-05-03 DIAGNOSIS — Z79899 Other long term (current) drug therapy: Secondary | ICD-10-CM | POA: Diagnosis not present

## 2018-05-03 DIAGNOSIS — G8929 Other chronic pain: Secondary | ICD-10-CM | POA: Insufficient documentation

## 2018-05-03 DIAGNOSIS — Y832 Surgical operation with anastomosis, bypass or graft as the cause of abnormal reaction of the patient, or of later complication, without mention of misadventure at the time of the procedure: Secondary | ICD-10-CM | POA: Diagnosis not present

## 2018-05-03 DIAGNOSIS — Z794 Long term (current) use of insulin: Secondary | ICD-10-CM | POA: Diagnosis not present

## 2018-05-03 DIAGNOSIS — I120 Hypertensive chronic kidney disease with stage 5 chronic kidney disease or end stage renal disease: Secondary | ICD-10-CM | POA: Diagnosis not present

## 2018-05-03 DIAGNOSIS — E1022 Type 1 diabetes mellitus with diabetic chronic kidney disease: Secondary | ICD-10-CM | POA: Insufficient documentation

## 2018-05-03 DIAGNOSIS — T82510A Breakdown (mechanical) of surgically created arteriovenous fistula, initial encounter: Secondary | ICD-10-CM | POA: Insufficient documentation

## 2018-05-03 HISTORY — PX: IR FLUORO GUIDE CV LINE RIGHT: IMG2283

## 2018-05-03 HISTORY — PX: IR US GUIDE VASC ACCESS RIGHT: IMG2390

## 2018-05-03 MED ORDER — FENTANYL CITRATE (PF) 100 MCG/2ML IJ SOLN
INTRAMUSCULAR | Status: AC
  Filled 2018-05-03: qty 2

## 2018-05-03 MED ORDER — LIDOCAINE HCL (PF) 1 % IJ SOLN
INTRAMUSCULAR | Status: AC | PRN
  Administered 2018-05-03: 10 mL

## 2018-05-03 MED ORDER — HEPARIN SODIUM (PORCINE) 1000 UNIT/ML IJ SOLN
INTRAMUSCULAR | Status: AC
  Filled 2018-05-03: qty 1

## 2018-05-03 MED ORDER — MIDAZOLAM HCL 2 MG/2ML IJ SOLN
INTRAMUSCULAR | Status: AC | PRN
  Administered 2018-05-03: 1 mg via INTRAVENOUS

## 2018-05-03 MED ORDER — CEFAZOLIN SODIUM-DEXTROSE 2-4 GM/100ML-% IV SOLN
INTRAVENOUS | Status: AC
  Filled 2018-05-03: qty 100

## 2018-05-03 MED ORDER — LIDOCAINE HCL 1 % IJ SOLN
INTRAMUSCULAR | Status: AC
  Filled 2018-05-03: qty 20

## 2018-05-03 MED ORDER — MIDAZOLAM HCL 2 MG/2ML IJ SOLN
INTRAMUSCULAR | Status: AC
  Filled 2018-05-03: qty 2

## 2018-05-03 MED ORDER — FENTANYL CITRATE (PF) 100 MCG/2ML IJ SOLN
INTRAMUSCULAR | Status: AC | PRN
  Administered 2018-05-03: 25 ug via INTRAVENOUS

## 2018-05-03 MED ORDER — CEFAZOLIN SODIUM-DEXTROSE 2-4 GM/100ML-% IV SOLN
INTRAVENOUS | Status: AC | PRN
  Administered 2018-05-03: 2 g via INTRAVENOUS

## 2018-05-03 NOTE — Procedures (Signed)
Interventional Radiology Procedure Note  Procedure: Tunneled HD catheter placement  Access: Right IJ vein  Complications: None  Estimated Blood Loss: < 10 mL  Findings: 19 cm tip to cuff length Palindrome catheter placed via right IJ with tip at SVC/RA junction. OK to use.  Venetia Night. Kathlene Cote, M.D Pager:  [phone]

## 2018-05-03 NOTE — Discharge Instructions (Signed)
Central Line Dialysis Access Placement, Care After This sheet gives you information about how to care for yourself after your procedure. Your health care provider may also give you more specific instructions. If you have problems or questions, contact your health care provider. What can I expect after the procedure? After the procedure, it is common to have:  Mild pain or discomfort.  Mild redness, swelling, or bruising around your incision.  A small amount of blood or clear fluid coming from your incision. Follow these instructions at home: Incision care   Follow instructions from your health care provider about how to take care of your incision. Make sure you: ? Wash your hands with soap and water before you change your bandage (dressing). If soap and water are not available, use hand sanitizer. ? Change your dressing as told by your health care provider. ? Leave stitches (sutures) in place.  Check your incision area every day for signs of infection. Check for: ? More redness, swelling, or pain. ? More fluid or blood. ? Warmth. ? Pus or a bad smell.  If directed, put heat on the catheter site as often as told by your health care provider. Use the heat source that your health care provider recommends, such as a moist heat pack or a heating pad. ? Place a towel between your skin and the heat source. ? Leave the heat on for 20-30 minutes. ? Remove the heat if your skin turns bright red. This is especially important if you are unable to feel pain, heat, or cold. You may have a greater risk of getting burned.  If directed, put ice on the catheter site: ? Put ice in a plastic bag. ? Place a towel between your skin and the bag. ? Leave the ice on for 20 minutes, 2-3 times a day. Medicines  Take over-the-counter and prescription medicines only as told by your health care provider.  If you were prescribed an antibiotic medicine, use it as told by your health care provider. Do not stop  using the antibiotic even if you start to feel better. Activity  Return to your normal activities as told by your health care provider. Ask your health care provider what activities are safe for you.  Do not lift anything that is heavier than 10 lb (4.5 kg) until your health care provider says that this is safe. Driving  Do not drive for 24 hours if you were given a medicine to help you relax (sedative) during your procedure.  Do not drive or use heavy machinery while taking prescription pain medicine. Lifestyle  Limit alcohol intake to no more than 1 drink a day for nonpregnant women and 2 drinks a day for men. One drink equals 12 oz of beer, 5 oz of wine, or 1 oz of hard liquor.  Do not use any products that contain nicotine or tobacco, such as cigarettes and e-cigarettes. If you need help quitting, ask your health care provider. General instructions  Do not take baths or showers, swim, or use a hot tub until your health care provider approves. You may only be allowed to take sponge baths for bathing.  Wear compression stockings as told by your health care provider. These stockings help to prevent blood clots and reduce swelling in your legs.  Follow instructions from your health care provider about eating or drinking restrictions.  Keep all follow-up visits as told by your health care provider. This is important. Contact a health care provider if:  Your   catheter gets pulled out of place.  Your catheter site becomes itchy.  You develop a rash around your catheter site.  You have more redness, swelling, or pain around your incision.  You have more fluid or blood coming from your incision.  Your incision area feels warm to the touch.  You have pus or a bad smell coming from your incision.  You have a fever. Get help right away if:  You become light-headed or dizzy.  You faint.  You have difficulty breathing.  Your catheter gets pulled out completely. This  information is not intended to replace advice given to you by your health care provider. Make sure you discuss any questions you have with your health care provider. Document Released: 09/07/2003 Document Revised: 10/18/2015 Document Reviewed: 10/18/2015 Elsevier Interactive Patient Education  2019 [place].  Moderate Conscious Sedation, Adult, Care After These instructions provide you with information about caring for yourself after your procedure. Your health care provider may also give you more specific instructions. Your treatment has been planned according to current medical practices, but problems sometimes occur. Call your health care provider if you have any problems or questions after your procedure. What can I expect after the procedure? After your procedure, it is common:  To feel sleepy for several hours.  To feel clumsy and have poor balance for several hours.  To have poor judgment for several hours.  To vomit if you eat too soon. Follow these instructions at home: For at least 24 hours after the procedure:   Do not: ? Participate in activities where you could fall or become injured. ? Drive. ? Use heavy machinery. ? Drink alcohol. ? Take sleeping pills or medicines that cause drowsiness. ? Make important decisions or sign legal documents. ? Take care of children on your own.  Rest. Eating and drinking  Follow the diet recommended by your health care provider.  If you vomit: ? Drink water, juice, or soup when you can drink without vomiting. ? Make sure you have little or no nausea before eating solid foods. General instructions  Have a responsible adult stay with you until you are awake and alert.  Take over-the-counter and prescription medicines only as told by your health care provider.  If you smoke, do not smoke without supervision.  Keep all follow-up visits as told by your health care provider. This is important. Contact a health care provider  if:  You keep feeling nauseous or you keep vomiting.  You feel light-headed.  You develop a rash.  You have a fever. Get help right away if:  You have trouble breathing. This information is not intended to replace advice given to you by your health care provider. Make sure you discuss any questions you have with your health care provider. Document Released: 11/13/2012 Document Revised: 06/28/2015 Document Reviewed: 05/15/2015 Elsevier Interactive Patient Education  2019 [employer].

## 2018-05-03 NOTE — H&P (Signed)
Chief Complaint: Patient was seen in consultation today for ESRD in need of HD/tunneled HD catheter placement.  Referring Physician(s): Fran Lowes  Supervising Physician: Aletta Edouard  Patient Status: [facility] - Out-pt  History of Present Illness: Brent Daniels is a 33 y.o. male with a past medical history of hypertension, hypercholesteremia, headaches, pneumonia, GERD, erosive esophagitis, PUD, history of hiatal hernia, gastroparesis, ESRD on HD, diabetes mellitus type I with associated neuropathy, anemia, chronic back pain, arthritis, insomnia, depression, and anxiety. He had a right brachiocephalic AVF placed in OR 04/09/2017 by Dr. Donnetta Hutching. He has been using this as access for dialysis. He has dialysis scheduled every Tuesday, Thursday, and Saturday. States that he completed dialysis Tuesday 0/17/5102 without complications. States that he went to dialysis yesterday, and they could not find a pulse. States they told him it "clotted off". He did not complete dialysis yesterday.  IR requested by Dr. Lowanda Foster for possible image-guided tunneled HD catheter placement so patient can undergo HD. Patient awake and alert sitting in bed with no complaints at this time. Denies fever, chills, chest pain, dyspnea, abdominal pain, or headache.   Past Medical History:  Diagnosis Date  . Acute esophagitis   . Anemia   . Anxiety   . Arthritis   . Bicuspid aortic valve    noted on 10/20/16 [facility])  . Chronic abdominal pain   . Chronic back pain   . Chronic kidney disease    Stage 5 Kidney disease  . Depression   . Diabetes mellitus    type 1  . Diabetic gastroparesis associated with type 1 diabetes mellitus ([place])   . Erosive esophagitis 12/23/2013   "severe" per EGD   . Gastroparesis   . GERD (gastroesophageal reflux disease)   . Headache    in the past  . Heart murmur    mild TR, mild MR, mild AS 02/19/17  . History of hiatal hernia   . Hypercholesteremia   . Hypertension   .  Insomnia   . Nausea and vomiting    chronic, recurrent  . Neuropathy   . Pneumonia   . PUD (peptic ulcer disease)   . S/P arthroscopic knee surgery 07/04/2011  . Scoliosis 07/11/2012    Past Surgical History:  Procedure Laterality Date  . AV FISTULA PLACEMENT Right 04/09/2017   Procedure: ARTERIOVENOUS (AV) FISTULA CREATION RIGHT ARM;  Surgeon: Rosetta Posner, MD;  Location: [place];  Service: Vascular;  Laterality: Right;  . BIOPSY  06/17/2013   Procedure: GASTRIC ULCER AND ANTRAL BIOPSIES; ESOPHAGEAL BRUSHING;  Surgeon: Danie Binder, MD;  Location: [facility];  Service: Endoscopy;;  . BIOPSY N/A 12/23/2013   Procedure: ESOPHAGEAL BIOPSY;  Surgeon: Daneil Dolin, MD;  Location: [facility];  Service: Endoscopy;  Laterality: N/A;  . CHOLECYSTECTOMY N/A 01/28/2014   Procedure: LAPAROSCOPIC CHOLECYSTECTOMY;  Surgeon: Jamesetta So, MD;  Location: [facility];  Service: General;  Laterality: N/A;  . ESOPHAGOGASTRODUODENOSCOPY (EGD) WITH PROPOFOL  06/17/2013   Dr. Oneida Alar: two gastric ulcers in fundus, mild antral gastritis, candida esophagitis  . ESOPHAGOGASTRODUODENOSCOPY (EGD) WITH PROPOFOL N/A 09/11/2013   Dr. [name]: abnormal hypopharynx, question massively enlarged tonsils, stomach full of food precluded exam, needs EGD for verification of ulcer healing at a later date  . ESOPHAGOGASTRODUODENOSCOPY (EGD) WITH PROPOFOL N/A 12/23/2013   RMR: Severe exudative esophagitis likely predominatly reflux related. Superimposed Candida infection not excluded status post KOH brushing and biopsy. Localized excoriating gastric mucosa most consistant with trauma(vomiting). No evidence of peptic  ulcer disease or other gastric/duodenal pathology. I suspect severe inflammation involving the distal esophagus may account  for at least some of patii  . INCISION AND DRAINAGE ABSCESS N/A 10/19/2017   Procedure: INCISION AND DRAINAGE ABSCESS SCROTAL EXPLORATION,WASHOUT, [place] OF TISSUE;  Surgeon: Ardis Hughs, MD;   Location: [place];  Service: Urology;  Laterality: N/A;  . IRRIGATION AND DEBRIDEMENT ABSCESS N/A 10/21/2017   Procedure: IRRIGATION AND DEBRIDEMENT ABSCESS;  Surgeon: Ardis Hughs, MD;  Location: [place];  Service: Urology;  Laterality: N/A;  . KNEE ARTHROSCOPY  06/30/2011   Procedure: ARTHROSCOPY KNEE;  Surgeon: Carole Civil, MD;  Location: [facility];  Service: Orthopedics;  Laterality: Right;  diagnostic arthroscopy  . TYMPANOSTOMY TUBE PLACEMENT      Allergies: Nsaids; Tramadol; Ibuprofen; Naproxen; Sulfa antibiotics; Hydrocodone bitartrate er; and Ketorolac  Medications: Prior to Admission medications   Medication Sig Start Date End Date Taking? Authorizing Provider  amLODipine (NORVASC) 10 MG tablet Take 10 mg by mouth daily.  05/08/15   [provider]  amoxicillin-clavulanate (AUGMENTIN) 500-125 MG tablet Take 1 tablet (500 mg total) by mouth 2 (two) times daily. 10/29/17   Thurnell Lose, MD  BAYER MICROLET LANCETS lancets Use as instructed to test sugar 5 times daily 10/16/17   Elayne Snare, MD  Buprenorphine HCl-Naloxone HCl 8-2 MG FILM Place 1 Film under the tongue 2 (two) times daily.    [provider]  calcitRIOL (ROCALTROL) 0.25 MCG capsule Take 1 capsule (0.25 mcg total) by mouth daily. Patient not taking: Reported on 10/16/2017 06/02/17   Orson Eva, MD  calcium acetate (PHOSLO) 667 MG capsule Take 667 mg by mouth 3 (three) times daily with meals. 05/08/17   [provider]  carvedilol (COREG) 25 MG tablet Take 1 tablet (25 mg total) by mouth 2 (two) times daily with a meal. 06/02/17   Tat, Shanon Brow, MD  cloNIDine (CATAPRES - DOSED IN MG/24 HR) 0.3 mg/24hr patch Place 1 patch (0.3 mg total) onto the skin once a week. Change on Friday's 06/08/17   Orson Eva, MD  collagenase (SANTYL) ointment Apply topically daily. 10/30/17   Thurnell Lose, MD  Continuous Blood Gluc Receiver (FREESTYLE LIBRE 14 DAY READER) DEVI 1 each by Does not apply route every 14  (fourteen) days. 04/05/17   Cassandria Anger, MD  furosemide (LASIX) 40 MG tablet Take 40 mg by mouth daily.  09/13/17   [provider]  gabapentin (NEURONTIN) 300 MG capsule Take 1 capsule (300 mg total) by mouth at bedtime. Patient not taking: Reported on 10/16/2017 11/19/16   Murlean Iba, MD  Gauze Pads & Dressings (DRESSING SPONGES) 4"X4" PADS Provide 1 month supply for daily dressing - Kerlix 4.5 inch-wide roll gauze(s)  Cover with dry dressing (gauze or ABD pads) & tape, daily change 10/29/17   Thurnell Lose, MD  glucose blood (CONTOUR NEXT TEST) test strip Use as instructed to test sugar 5 times daily 09/19/17   Elayne Snare, MD  glucose blood (ONETOUCH VERIO) test strip Use to test blood sugar two times daily 12/12/17   Elayne Snare, MD  Insulin Glargine (BASAGLAR KWIKPEN) 100 UNIT/ML SOPN Inject 6 Units into the skin 2 (two) times daily.    [provider]  insulin lispro (HUMALOG KWIKPEN) 100 UNIT/ML KwikPen INJECT 10 UNITS INTO THE SKIN THREE TIMES DAILY BEFORE MEALS (this is a 50 DAY supply) 01/21/18   Elayne Snare, MD  Insulin Pen Needle (CARETOUCH PEN NEEDLES) 31G X 8  MM MISC To use with Basaglar and Humalog pens, 4 times daily 10/23/17   Elayne Snare, MD  oxyCODONE (OXY IR/ROXICODONE) 5 MG immediate release tablet Take 1 tablet (5 mg total) by mouth every 6 (six) hours as needed for moderate pain or severe pain. 10/29/17   Thurnell Lose, MD  pantoprazole (PROTONIX) 40 MG tablet Take 1 tablet (40 mg total) by mouth 2 (two) times daily before a meal. Patient taking differently: Take 40 mg by mouth daily.  11/19/16 04/04/18  Murlean Iba, MD  promethazine (PHENERGAN) 25 MG tablet Take 25 mg by mouth 3 (three) times daily as needed for nausea or vomiting.  09/15/17   [provider]  tiZANidine (ZANAFLEX) 4 MG tablet Take 2 mg by mouth every 6 (six) hours as needed for muscle spasms.     [provider]     Family History  Problem  Relation Age of Onset  . Cancer Father   . Alcohol abuse Father   . Diabetes Father   . Diabetes Paternal Grandfather   . Pseudochol deficiency Neg Hx   . Malignant hyperthermia Neg Hx   . Hypotension Neg Hx   . Anesthesia problems Neg Hx   . Colon cancer Neg Hx     Social History   Socioeconomic History  . Marital status: Single    Spouse name: Not on file  . Number of children: Not on file  . Years of education: 75  . Highest education level: Not on file  Occupational History  . Occupation: unemployed    Fish farm manager: UNEMPLOYED    Comment: trying to get disability  Social Needs  . Financial resource strain: Not on file  . Food insecurity:    Worry: Not on file    Inability: Not on file  . Transportation needs:    Medical: Not on file    Non-medical: Not on file  Tobacco Use  . Smoking status: Current Every Day Smoker    Packs/day: 0.25    Years: 10.00    Pack years: 2.50    Types: Cigarettes  . Smokeless tobacco: Former Systems developer    Quit date: 02/17/2013  Substance and Sexual Activity  . Alcohol use: No  . Drug use: No  . Sexual activity: Not Currently  Lifestyle  . Physical activity:    Days per week: Not on file    Minutes per session: Not on file  . Stress: Not on file  Relationships  . Social connections:    Talks on phone: Not on file    Gets together: Not on file    Attends religious service: Not on file    Active member of club or organization: Not on file    Attends meetings of clubs or organizations: Not on file    Relationship status: Not on file  Other Topics Concern  . Not on file  Social History Narrative  . Not on file     Review of Systems: A 12 point ROS discussed and pertinent positives are indicated in the HPI above.  All other systems are negative.  Review of Systems  Constitutional: Negative for chills and fever.  Respiratory: Negative for shortness of breath and wheezing.   Cardiovascular: Negative for chest pain and palpitations.   Gastrointestinal: Negative for abdominal pain.  Neurological: Negative for headaches.  Psychiatric/Behavioral: Negative for behavioral problems and confusion.    Vital Signs: BP (!) 182/115   Pulse 73   Temp 98.1 F (36.7 C)  Resp 18   SpO2 100%   Physical Exam Vitals signs and nursing note reviewed.  Constitutional:      General: He is not in acute distress.    Appearance: Normal appearance.  Cardiovascular:     Rate and Rhythm: Normal rate and regular rhythm.     Heart sounds: Normal heart sounds. No murmur.     Comments: Right AC fossa (at site of AVF) without tenderness, erythema, or active bleeding; no palpable thrill; no bruit heard on ascultation. Pulmonary:     Effort: Pulmonary effort is normal. No respiratory distress.     Breath sounds: Normal breath sounds. No wheezing.  Skin:    General: Skin is warm and dry.  Neurological:     Mental Status: He is alert and oriented to person, place, and time.  Psychiatric:        Mood and Affect: Mood normal.        Behavior: Behavior normal.        Thought Content: Thought content normal.        Judgment: Judgment normal.      MD Evaluation Airway: WNL Heart: WNL Abdomen: WNL Chest/ Lungs: WNL ASA  Classification: 3 Mallampati/Airway Score: One    Labs:  CBC: Recent Labs    10/26/17 0445 10/27/17 0604 10/28/17 0906 10/29/17 0517  WBC 17.0* 15.7* 17.2* 15.8*  HGB 8.4* 8.5* 8.9* 8.3*  HCT 27.0* 27.3* 29.4* 26.3*  PLT 650* 627* 688* 541*    COAGS: Recent Labs    10/28/17 0906  INR 1.18    BMP: Recent Labs    10/27/17 0604 10/28/17 0906 10/29/17 0517 10/29/17 1455  NA 139 137 130* 135  K 4.1 4.7 5.6* 4.4  CL 108 103 96* 100  CO2 22 22 17* 23  GLUCOSE 73 251* 425* 255*  BUN 27* 27* 34* 36*  CALCIUM 8.8* 9.0 8.6* 8.9  CREATININE 4.42* 4.63* 4.89* 4.79*  GFRNONAA 16* 15* 14* 15*  GFRAA 19* 18* 16* 17*    LIVER FUNCTION TESTS: Recent Labs    05/31/17 0422 10/16/17 1250 10/19/17  0424 10/24/17 0510 10/25/17 0448  BILITOT 1.6* 0.8  --  0.8 0.5  AST 23 18  --  11* 11*  ALT 19 7  --  9 8  ALKPHOS 115 88  --  87 75  PROT 7.0 7.8  --  6.4* 5.5*  ALBUMIN 3.7 3.7 2.4* 2.3* 2.0*     Assessment and Plan:  ESRD in need of HD. Plan for image-guided tunneled HD catheter placement today with Dr. Kathlene Cote. Patient is NPO. Afebrile. He does not take blood thinners.  Risks and benefits discussed with the patient including, but not limited to bleeding, infection, vascular injury, pneumothorax which may require chest tube placement, air embolism or even death. All of the patient's questions were answered, patient is agreeable to proceed. Consent signed and in chart.   Thank you for this interesting consult.  I greatly enjoyed meeting LEIBISH MCGREGOR and look forward to participating in their care.  A copy of this report was sent to the requesting provider on this date.  Electronically Signed: Earley Abide, PA-C 05/03/2018, 2:33 PM   I spent a total of  30 Minutes in face to face in clinical consultation, greater than 50% of which was counseling/coordinating care for ESRD in need of HD/tunneled HD catheter placement.

## 2018-06-26 ENCOUNTER — Emergency Department (HOSPITAL_COMMUNITY): Payer: Medicare Other

## 2018-06-26 ENCOUNTER — Encounter (HOSPITAL_COMMUNITY): Payer: Self-pay | Admitting: Emergency Medicine

## 2018-06-26 ENCOUNTER — Inpatient Hospital Stay (HOSPITAL_COMMUNITY)
Admission: EM | Admit: 2018-06-26 | Discharge: 2018-07-03 | DRG: 917 | Disposition: A | Discharge status: Home / Self Care | Payer: Medicare Other | Attending: Internal Medicine | Admitting: Internal Medicine

## 2018-06-26 DIAGNOSIS — Z781 Physical restraint status: Secondary | ICD-10-CM

## 2018-06-26 DIAGNOSIS — E1143 Type 2 diabetes mellitus with diabetic autonomic (poly)neuropathy: Secondary | ICD-10-CM | POA: Diagnosis present

## 2018-06-26 DIAGNOSIS — R05 Cough: Secondary | ICD-10-CM | POA: Diagnosis not present

## 2018-06-26 DIAGNOSIS — T405X1A Poisoning by cocaine, accidental (unintentional), initial encounter: Secondary | ICD-10-CM | POA: Diagnosis present

## 2018-06-26 DIAGNOSIS — R509 Fever, unspecified: Secondary | ICD-10-CM

## 2018-06-26 DIAGNOSIS — G9340 Encephalopathy, unspecified: Secondary | ICD-10-CM | POA: Diagnosis not present

## 2018-06-26 DIAGNOSIS — F1120 Opioid dependence, uncomplicated: Secondary | ICD-10-CM | POA: Diagnosis not present

## 2018-06-26 DIAGNOSIS — G8929 Other chronic pain: Secondary | ICD-10-CM | POA: Diagnosis present

## 2018-06-26 DIAGNOSIS — J9601 Acute respiratory failure with hypoxia: Secondary | ICD-10-CM | POA: Diagnosis not present

## 2018-06-26 DIAGNOSIS — Z20828 Contact with and (suspected) exposure to other viral communicable diseases: Secondary | ICD-10-CM | POA: Diagnosis present

## 2018-06-26 DIAGNOSIS — Z794 Long term (current) use of insulin: Secondary | ICD-10-CM

## 2018-06-26 DIAGNOSIS — K0889 Other specified disorders of teeth and supporting structures: Secondary | ICD-10-CM | POA: Diagnosis not present

## 2018-06-26 DIAGNOSIS — E875 Hyperkalemia: Secondary | ICD-10-CM | POA: Diagnosis not present

## 2018-06-26 DIAGNOSIS — F1290 Cannabis use, unspecified, uncomplicated: Secondary | ICD-10-CM | POA: Diagnosis not present

## 2018-06-26 DIAGNOSIS — Z992 Dependence on renal dialysis: Secondary | ICD-10-CM | POA: Diagnosis not present

## 2018-06-26 DIAGNOSIS — G4089 Other seizures: Secondary | ICD-10-CM | POA: Diagnosis present

## 2018-06-26 DIAGNOSIS — J9690 Respiratory failure, unspecified, unspecified whether with hypoxia or hypercapnia: Secondary | ICD-10-CM

## 2018-06-26 DIAGNOSIS — D631 Anemia in chronic kidney disease: Secondary | ICD-10-CM | POA: Diagnosis present

## 2018-06-26 DIAGNOSIS — N2581 Secondary hyperparathyroidism of renal origin: Secondary | ICD-10-CM | POA: Diagnosis present

## 2018-06-26 DIAGNOSIS — IMO0002 Reserved for concepts with insufficient information to code with codable children: Secondary | ICD-10-CM | POA: Diagnosis present

## 2018-06-26 DIAGNOSIS — N186 End stage renal disease: Secondary | ICD-10-CM | POA: Diagnosis present

## 2018-06-26 DIAGNOSIS — F1410 Cocaine abuse, uncomplicated: Secondary | ICD-10-CM | POA: Diagnosis present

## 2018-06-26 DIAGNOSIS — R112 Nausea with vomiting, unspecified: Secondary | ICD-10-CM

## 2018-06-26 DIAGNOSIS — E1122 Type 2 diabetes mellitus with diabetic chronic kidney disease: Secondary | ICD-10-CM | POA: Diagnosis present

## 2018-06-26 DIAGNOSIS — G92 Toxic encephalopathy: Secondary | ICD-10-CM | POA: Diagnosis present

## 2018-06-26 DIAGNOSIS — R402212 Coma scale, best verbal response, none, at arrival to emergency department: Secondary | ICD-10-CM | POA: Diagnosis present

## 2018-06-26 DIAGNOSIS — Z9115 Patient's noncompliance with renal dialysis: Secondary | ICD-10-CM

## 2018-06-26 DIAGNOSIS — F1123 Opioid dependence with withdrawal: Secondary | ICD-10-CM | POA: Diagnosis not present

## 2018-06-26 DIAGNOSIS — R402122 Coma scale, eyes open, to pain, at arrival to emergency department: Secondary | ICD-10-CM | POA: Diagnosis present

## 2018-06-26 DIAGNOSIS — I120 Hypertensive chronic kidney disease with stage 5 chronic kidney disease or end stage renal disease: Secondary | ICD-10-CM | POA: Diagnosis present

## 2018-06-26 DIAGNOSIS — I161 Hypertensive emergency: Secondary | ICD-10-CM | POA: Diagnosis present

## 2018-06-26 DIAGNOSIS — K3184 Gastroparesis: Secondary | ICD-10-CM | POA: Diagnosis present

## 2018-06-26 DIAGNOSIS — J9600 Acute respiratory failure, unspecified whether with hypoxia or hypercapnia: Secondary | ICD-10-CM | POA: Diagnosis present

## 2018-06-26 DIAGNOSIS — E1165 Type 2 diabetes mellitus with hyperglycemia: Secondary | ICD-10-CM | POA: Diagnosis not present

## 2018-06-26 DIAGNOSIS — K089 Disorder of teeth and supporting structures, unspecified: Secondary | ICD-10-CM

## 2018-06-26 DIAGNOSIS — R109 Unspecified abdominal pain: Secondary | ICD-10-CM | POA: Diagnosis not present

## 2018-06-26 DIAGNOSIS — E11649 Type 2 diabetes mellitus with hypoglycemia without coma: Secondary | ICD-10-CM | POA: Diagnosis not present

## 2018-06-26 DIAGNOSIS — R569 Unspecified convulsions: Secondary | ICD-10-CM | POA: Diagnosis not present

## 2018-06-26 DIAGNOSIS — F1490 Cocaine use, unspecified, uncomplicated: Secondary | ICD-10-CM | POA: Diagnosis not present

## 2018-06-26 DIAGNOSIS — R110 Nausea: Secondary | ICD-10-CM | POA: Diagnosis not present

## 2018-06-26 DIAGNOSIS — R402352 Coma scale, best motor response, localizes pain, at arrival to emergency department: Secondary | ICD-10-CM | POA: Diagnosis present

## 2018-06-26 DIAGNOSIS — E876 Hypokalemia: Secondary | ICD-10-CM | POA: Diagnosis not present

## 2018-06-26 DIAGNOSIS — R1110 Vomiting, unspecified: Secondary | ICD-10-CM

## 2018-06-26 DIAGNOSIS — Z0189 Encounter for other specified special examinations: Secondary | ICD-10-CM

## 2018-06-26 HISTORY — DX: Disorder of kidney and ureter, unspecified: N28.9

## 2018-06-26 HISTORY — DX: Type 2 diabetes mellitus without complications: E11.9

## 2018-06-26 LAB — COMPREHENSIVE METABOLIC PANEL
ALT: 19 U/L (ref 0–44)
AST: 23 U/L (ref 15–41)
Albumin: 4.3 g/dL (ref 3.5–5.0)
Alkaline Phosphatase: 83 U/L (ref 38–126)
Anion gap: 13 (ref 5–15)
BUN: 58 mg/dL — ABNORMAL HIGH (ref 6–20)
CO2: 22 mmol/L (ref 22–32)
Calcium: 9.9 mg/dL (ref 8.9–10.3)
Chloride: 105 mmol/L (ref 98–111)
Creatinine, Ser: 7.73 mg/dL — ABNORMAL HIGH (ref 0.61–1.24)
GFR calc Af Amer: 10 mL/min — ABNORMAL LOW (ref >60)
GFR calc non Af Amer: 8 mL/min — ABNORMAL LOW (ref >60)
Glucose, Bld: 68 mg/dL — ABNORMAL LOW (ref 70–99)
Potassium: 4.7 mmol/L (ref 3.5–5.1)
Sodium: 140 mmol/L (ref 135–145)
Total Bilirubin: 0.7 mg/dL (ref 0.3–1.2)
Total Protein: 7.5 g/dL (ref 6.5–8.1)

## 2018-06-26 LAB — TYPE AND SCREEN
ABO/RH(D): A POS
Antibody Screen: NEGATIVE
Unit division: 0
Unit division: 0

## 2018-06-26 LAB — PROTIME-INR
INR: 1.1 (ref 0.8–1.2)
Prothrombin Time: 14.4 seconds (ref 11.4–15.2)

## 2018-06-26 LAB — BPAM FFP
Blood Product Expiration Date: 202005242359
Blood Product Expiration Date: 202005242359
ISSUE DATE / TIME: 202005202137
ISSUE DATE / TIME: 202005202137
Unit Type and Rh: 600
Unit Type and Rh: 6200

## 2018-06-26 LAB — CBG MONITORING, ED
Glucose-Capillary: 171 mg/dL — ABNORMAL HIGH (ref 70–99)
Glucose-Capillary: 65 mg/dL — ABNORMAL LOW (ref 70–99)

## 2018-06-26 LAB — PREPARE FRESH FROZEN PLASMA

## 2018-06-26 LAB — BPAM RBC
Blood Product Expiration Date: 202006242359
Blood Product Expiration Date: 202006242359
ISSUE DATE / TIME: 202005202137
ISSUE DATE / TIME: 202005202137
Unit Type and Rh: 5100
Unit Type and Rh: 5100

## 2018-06-26 LAB — ETHANOL: Alcohol, Ethyl (B): <10 mg/dL (ref <10)

## 2018-06-26 LAB — CBC WITH DIFFERENTIAL/PLATELET
Abs Immature Granulocytes: 0.05 10*3/uL (ref 0.00–0.07)
Basophils Absolute: 0.1 10*3/uL (ref 0.0–0.1)
Basophils Relative: 1 %
Eosinophils Absolute: 0.6 10*3/uL — ABNORMAL HIGH (ref 0.0–0.5)
Eosinophils Relative: 5 %
HCT: 38 % — ABNORMAL LOW (ref 39.0–52.0)
Hemoglobin: 12.2 g/dL — ABNORMAL LOW (ref 13.0–17.0)
Immature Granulocytes: 0 %
Lymphocytes Relative: 22 %
Lymphs Abs: 2.8 10*3/uL (ref 0.7–4.0)
MCH: 30.1 pg (ref 26.0–34.0)
MCHC: 32.1 g/dL (ref 30.0–36.0)
MCV: 93.8 fL (ref 80.0–100.0)
Monocytes Absolute: 0.9 10*3/uL (ref 0.1–1.0)
Monocytes Relative: 7 %
Neutro Abs: 8.2 10*3/uL — ABNORMAL HIGH (ref 1.7–7.7)
Neutrophils Relative %: 65 %
Platelets: 284 10*3/uL (ref 150–400)
RBC: 4.05 MIL/uL — ABNORMAL LOW (ref 4.22–5.81)
RDW: 17.5 % — ABNORMAL HIGH (ref 11.5–15.5)
WBC: 12.6 10*3/uL — ABNORMAL HIGH (ref 4.0–10.5)
nRBC: 0 % (ref 0.0–0.2)

## 2018-06-26 LAB — POCT I-STAT 7, (LYTES, BLD GAS, ICA,H+H)
Bicarbonate: 25.3 mmol/L (ref 20.0–28.0)
Calcium, Ion: 1.3 mmol/L (ref 1.15–1.40)
HCT: 35 % — ABNORMAL LOW (ref 39.0–52.0)
Hemoglobin: 11.9 g/dL — ABNORMAL LOW (ref 13.0–17.0)
O2 Saturation: 100 %
Patient temperature: 98.6
Potassium: 4.5 mmol/L (ref 3.5–5.1)
Sodium: 139 mmol/L (ref 135–145)
TCO2: 27 mmol/L (ref 22–32)
pCO2 arterial: 42.6 mmHg (ref 32.0–48.0)
pH, Arterial: 7.383 (ref 7.350–7.450)
pO2, Arterial: 248 mmHg — ABNORMAL HIGH (ref 83.0–108.0)

## 2018-06-26 LAB — SARS CORONAVIRUS 2 BY RT PCR (HOSPITAL ORDER, PERFORMED IN ~~LOC~~ HOSPITAL LAB): SARS Coronavirus 2: NEGATIVE

## 2018-06-26 MED ORDER — HEPARIN SODIUM (PORCINE) 5000 UNIT/ML IJ SOLN
5000.0000 [IU] | Freq: Three times a day (TID) | INTRAMUSCULAR | Status: DC
  Administered 2018-06-27 – 2018-07-03 (×16): 5000 [IU] via SUBCUTANEOUS
  Filled 2018-06-26 (×15): qty 1

## 2018-06-26 MED ORDER — ETOMIDATE 2 MG/ML IV SOLN
INTRAVENOUS | Status: AC | PRN
  Administered 2018-06-26: 20 mg via INTRAVENOUS

## 2018-06-26 MED ORDER — DEXTROSE 50 % IV SOLN
INTRAVENOUS | Status: AC
  Filled 2018-06-26: qty 50

## 2018-06-26 MED ORDER — ROCURONIUM BROMIDE 50 MG/5ML IV SOLN
INTRAVENOUS | Status: AC | PRN
  Administered 2018-06-26: 100 mg via INTRAVENOUS

## 2018-06-26 MED ORDER — PROPOFOL 1000 MG/100ML IV EMUL
INTRAVENOUS | Status: AC
  Administered 2018-06-26: 22:00:00 20 ug
  Filled 2018-06-26: qty 100

## 2018-06-26 MED ORDER — LABETALOL HCL 5 MG/ML IV SOLN
20.0000 mg | INTRAVENOUS | Status: DC | PRN
  Administered 2018-06-26: 20 mg via INTRAVENOUS
  Filled 2018-06-26: qty 4

## 2018-06-26 MED ORDER — DEXTROSE 50 % IV SOLN
INTRAVENOUS | Status: AC | PRN
  Administered 2018-06-26: 1 via INTRAVENOUS

## 2018-06-26 MED ORDER — LORAZEPAM 2 MG/ML IJ SOLN
INTRAMUSCULAR | Status: AC | PRN
  Administered 2018-06-26: 2 mg via INTRAVENOUS

## 2018-06-26 MED ORDER — SODIUM CHLORIDE 0.9 % IV SOLN: INTRAVENOUS | Status: DC

## 2018-06-26 MED ORDER — HYDRALAZINE HCL 20 MG/ML IJ SOLN
10.0000 mg | INTRAMUSCULAR | Status: DC | PRN
  Administered 2018-06-27: 23:00:00 20 mg via INTRAVENOUS
  Administered 2018-06-29: 04:00:00 10 mg via INTRAVENOUS
  Administered 2018-06-30 (×2): 20 mg via INTRAVENOUS
  Filled 2018-06-26 (×4): qty 1

## 2018-06-26 MED ORDER — LORAZEPAM 2 MG/ML IJ SOLN
INTRAMUSCULAR | Status: AC
  Filled 2018-06-26: qty 1

## 2018-06-26 MED ORDER — DEXMEDETOMIDINE HCL IN NACL 200 MCG/50ML IV SOLN
0.4000 ug/kg/h | INTRAVENOUS | Status: DC
  Administered 2018-06-27 (×2): 1 ug/kg/h via INTRAVENOUS
  Administered 2018-06-27: 01:00:00 0.8 ug/kg/h via INTRAVENOUS
  Administered 2018-06-27 – 2018-06-28 (×8): 1 ug/kg/h via INTRAVENOUS
  Filled 2018-06-26 (×12): qty 50

## 2018-06-26 NOTE — ED Notes (Signed)
Pt following commands, extubated by CCM

## 2018-06-26 NOTE — ED Notes (Signed)
CCM at bedside 

## 2018-06-26 NOTE — ED Triage Notes (Signed)
BIB [facility]. Per EMS pt had unwitnessed fall. Unknown if pt hit head or LOC. GCS of 11 on EMS arrival. Hx of severe DM. Pt is dialysis pt. CBG 103 with EMS. Combative in route. 5mg  versed given IM PTA.

## 2018-06-26 NOTE — Procedures (Signed)
Extubation Procedure Note  Patient Details:   Name: KHYLIN GUTRIDGE DOB: 12-19-1985 MRN: [id number]   Airway Documentation:  Airway 7.5 mm (Active)  Secured at (cm) 24 cm 06/26/2018 10:15 PM  Measured From Lips 06/26/2018 10:15 PM  Secured Location Right 06/26/2018 10:15 PM  Secured By [employer] 06/26/2018 10:15 PM  Tube Holder Repositioned Yes 06/26/2018 10:15 PM  Cuff Pressure (cm H2O) 28 cm H2O 06/26/2018 10:15 PM  Site Condition Dry 06/26/2018 10:15 PM   Vent end date: 06/26/18 Vent end time: 2320   Evaluation  O2 sats: stable throughout Complications: No apparent complications Patient did tolerate procedure well. Bilateral Breath Sounds: Clear  Extubated per CCM, placed patient on 3lpm nasal cannula.  Yes  Ulice Dash 06/26/2018, 11:54 PM

## 2018-06-27 ENCOUNTER — Inpatient Hospital Stay (HOSPITAL_COMMUNITY): Payer: Medicare Other

## 2018-06-27 DIAGNOSIS — N186 End stage renal disease: Secondary | ICD-10-CM

## 2018-06-27 DIAGNOSIS — G9340 Encephalopathy, unspecified: Secondary | ICD-10-CM

## 2018-06-27 DIAGNOSIS — E1165 Type 2 diabetes mellitus with hyperglycemia: Secondary | ICD-10-CM | POA: Diagnosis present

## 2018-06-27 DIAGNOSIS — Z992 Dependence on renal dialysis: Secondary | ICD-10-CM

## 2018-06-27 DIAGNOSIS — IMO0002 Reserved for concepts with insufficient information to code with codable children: Secondary | ICD-10-CM | POA: Diagnosis present

## 2018-06-27 DIAGNOSIS — R569 Unspecified convulsions: Secondary | ICD-10-CM

## 2018-06-27 DIAGNOSIS — I161 Hypertensive emergency: Secondary | ICD-10-CM | POA: Diagnosis present

## 2018-06-27 LAB — BASIC METABOLIC PANEL
Anion gap: 17 — ABNORMAL HIGH (ref 5–15)
Anion gap: 15 (ref 5–15)
BUN: 58 mg/dL — ABNORMAL HIGH (ref 6–20)
BUN: 66 mg/dL — ABNORMAL HIGH (ref 6–20)
CO2: 19 mmol/L — ABNORMAL LOW (ref 22–32)
CO2: 22 mmol/L (ref 22–32)
Calcium: 9.3 mg/dL (ref 8.9–10.3)
Calcium: 9.6 mg/dL (ref 8.9–10.3)
Chloride: 99 mmol/L (ref 98–111)
Chloride: 106 mmol/L (ref 98–111)
Creatinine, Ser: 7.75 mg/dL — ABNORMAL HIGH (ref 0.61–1.24)
Creatinine, Ser: 9 mg/dL — ABNORMAL HIGH (ref 0.61–1.24)
GFR calc Af Amer: 10 mL/min — ABNORMAL LOW (ref >60)
GFR calc Af Amer: 8 mL/min — ABNORMAL LOW (ref >60)
GFR calc non Af Amer: 8 mL/min — ABNORMAL LOW (ref >60)
GFR calc non Af Amer: 7 mL/min — ABNORMAL LOW (ref >60)
Glucose, Bld: 470 mg/dL — ABNORMAL HIGH (ref 70–99)
Glucose, Bld: 80 mg/dL (ref 70–99)
Potassium: 6.6 mmol/L (ref 3.5–5.1)
Potassium: 5.1 mmol/L (ref 3.5–5.1)
Sodium: 135 mmol/L (ref 135–145)
Sodium: 143 mmol/L (ref 135–145)

## 2018-06-27 LAB — GLUCOSE, CAPILLARY
Glucose-Capillary: 411 mg/dL — ABNORMAL HIGH (ref 70–99)
Glucose-Capillary: 427 mg/dL — ABNORMAL HIGH (ref 70–99)
Glucose-Capillary: 302 mg/dL — ABNORMAL HIGH (ref 70–99)
Glucose-Capillary: 221 mg/dL — ABNORMAL HIGH (ref 70–99)
Glucose-Capillary: 170 mg/dL — ABNORMAL HIGH (ref 70–99)
Glucose-Capillary: 151 mg/dL — ABNORMAL HIGH (ref 70–99)
Glucose-Capillary: 189 mg/dL — ABNORMAL HIGH (ref 70–99)
Glucose-Capillary: 205 mg/dL — ABNORMAL HIGH (ref 70–99)
Glucose-Capillary: 187 mg/dL — ABNORMAL HIGH (ref 70–99)
Glucose-Capillary: 112 mg/dL — ABNORMAL HIGH (ref 70–99)
Glucose-Capillary: 77 mg/dL (ref 70–99)
Glucose-Capillary: 66 mg/dL — ABNORMAL LOW (ref 70–99)
Glucose-Capillary: 77 mg/dL (ref 70–99)
Glucose-Capillary: 144 mg/dL — ABNORMAL HIGH (ref 70–99)
Glucose-Capillary: 296 mg/dL — ABNORMAL HIGH (ref 70–99)
Glucose-Capillary: 349 mg/dL — ABNORMAL HIGH (ref 70–99)

## 2018-06-27 LAB — URINALYSIS, ROUTINE W REFLEX MICROSCOPIC
Bacteria, UA: NONE SEEN
Bilirubin Urine: NEGATIVE
Glucose, UA: >=500 mg/dL — AB
Ketones, ur: 5 mg/dL — AB
Leukocytes,Ua: NEGATIVE
Nitrite: NEGATIVE
Protein, ur: >=300 mg/dL — AB
Specific Gravity, Urine: 1.01 (ref 1.005–1.030)
pH: 7 (ref 5.0–8.0)

## 2018-06-27 LAB — MRSA PCR SCREENING: MRSA by PCR: NEGATIVE

## 2018-06-27 LAB — CK
Total CK: 201 U/L (ref 49–397)
Total CK: 221 U/L (ref 49–397)

## 2018-06-27 LAB — CSF CELL COUNT WITH DIFFERENTIAL
Eosinophils, CSF: 0 % (ref 0–1)
Lymphs, CSF: 1 % — ABNORMAL LOW (ref 40–80)
Monocyte-Macrophage-Spinal Fluid: 4 % — ABNORMAL LOW (ref 15–45)
RBC Count, CSF: 0 /mm3
Segmented Neutrophils-CSF: 95 % — ABNORMAL HIGH (ref 0–6)
Tube #: 3
WBC, CSF: 113 /mm3 (ref 0–5)

## 2018-06-27 LAB — CBC
HCT: 38.3 % — ABNORMAL LOW (ref 39.0–52.0)
Hemoglobin: 12.3 g/dL — ABNORMAL LOW (ref 13.0–17.0)
MCH: 30.4 pg (ref 26.0–34.0)
MCHC: 32.1 g/dL (ref 30.0–36.0)
MCV: 94.6 fL (ref 80.0–100.0)
Platelets: 245 10*3/uL (ref 150–400)
RBC: 4.05 MIL/uL — ABNORMAL LOW (ref 4.22–5.81)
RDW: 17.2 % — ABNORMAL HIGH (ref 11.5–15.5)
WBC: 21.2 10*3/uL — ABNORMAL HIGH (ref 4.0–10.5)
nRBC: 0 % (ref 0.0–0.2)

## 2018-06-27 LAB — HIV ANTIBODY (ROUTINE TESTING W REFLEX): HIV Screen 4th Generation wRfx: NONREACTIVE

## 2018-06-27 LAB — PROTEIN AND GLUCOSE, CSF
Glucose, CSF: 133 mg/dL — ABNORMAL HIGH (ref 40–70)
Total  Protein, CSF: 34 mg/dL (ref 15–45)

## 2018-06-27 LAB — RAPID URINE DRUG SCREEN, HOSP PERFORMED
Amphetamines: NOT DETECTED
Barbiturates: NOT DETECTED
Benzodiazepines: POSITIVE — AB
Cocaine: POSITIVE — AB
Opiates: NOT DETECTED
Tetrahydrocannabinol: NOT DETECTED

## 2018-06-27 LAB — HEMOGLOBIN A1C
Hgb A1c MFr Bld: 8.8 % — ABNORMAL HIGH (ref 4.8–5.6)
Mean Plasma Glucose: 205.86 mg/dL

## 2018-06-27 LAB — PROCALCITONIN: Procalcitonin: 2.27 ng/mL

## 2018-06-27 LAB — CRYPTOCOCCAL ANTIGEN, CSF: Crypto Ag: NEGATIVE

## 2018-06-27 LAB — MAGNESIUM: Magnesium: 2 mg/dL (ref 1.7–2.4)

## 2018-06-27 LAB — STREP PNEUMONIAE URINARY ANTIGEN: Strep Pneumo Urinary Antigen: NEGATIVE

## 2018-06-27 LAB — LACTIC ACID, PLASMA: Lactic Acid, Venous: 0.8 mmol/L (ref 0.5–1.9)

## 2018-06-27 LAB — PHOSPHORUS: Phosphorus: 6.5 mg/dL — ABNORMAL HIGH (ref 2.5–4.6)

## 2018-06-27 MED ORDER — SODIUM CHLORIDE 0.9 % IV SOLN
2.0000 g | Freq: Two times a day (BID) | INTRAVENOUS | Status: DC
  Administered 2018-06-27 – 2018-06-28 (×3): 2 g via INTRAVENOUS
  Filled 2018-06-27 (×6): qty 20

## 2018-06-27 MED ORDER — LORAZEPAM 2 MG/ML IJ SOLN
INTRAMUSCULAR | Status: AC
  Administered 2018-06-27: 4 mg
  Filled 2018-06-27: qty 2

## 2018-06-27 MED ORDER — LABETALOL HCL 5 MG/ML IV SOLN
20.0000 mg | INTRAVENOUS | Status: DC | PRN
  Administered 2018-06-28: 08:00:00 20 mg via INTRAVENOUS
  Filled 2018-06-27: qty 4

## 2018-06-27 MED ORDER — HALOPERIDOL LACTATE 5 MG/ML IJ SOLN
1.0000 mg | Freq: Once | INTRAMUSCULAR | Status: AC
  Administered 2018-06-27: 01:00:00 1 mg via INTRAVENOUS

## 2018-06-27 MED ORDER — CHLORHEXIDINE GLUCONATE CLOTH 2 % EX PADS
6.0000 | MEDICATED_PAD | Freq: Every day | CUTANEOUS | Status: DC
  Administered 2018-06-29 – 2018-07-02 (×4): 6 via TOPICAL

## 2018-06-27 MED ORDER — VANCOMYCIN HCL 10 G IV SOLR
1500.0000 mg | Freq: Once | INTRAVENOUS | Status: AC
  Administered 2018-06-27: 18:00:00 1500 mg via INTRAVENOUS
  Filled 2018-06-27: qty 1500

## 2018-06-27 MED ORDER — HALOPERIDOL LACTATE 5 MG/ML IJ SOLN
1.0000 mg | Freq: Once | INTRAMUSCULAR | Status: AC
  Administered 2018-06-27: 1 mg via INTRAVENOUS
  Administered 2018-06-27: 02:00:00 4 mg via INTRAVENOUS

## 2018-06-27 MED ORDER — INSULIN ASPART 100 UNIT/ML ~~LOC~~ SOLN: 0.0000 [IU] | SUBCUTANEOUS | Status: DC

## 2018-06-27 MED ORDER — HALOPERIDOL LACTATE 5 MG/ML IJ SOLN
INTRAMUSCULAR | Status: AC
  Administered 2018-06-27: 02:00:00 4 mg via INTRAVENOUS
  Filled 2018-06-27: qty 1

## 2018-06-27 MED ORDER — VANCOMYCIN HCL IN DEXTROSE 750-5 MG/150ML-% IV SOLN
750.0000 mg | Freq: Once | INTRAVENOUS | Status: DC
  Filled 2018-06-27: qty 150

## 2018-06-27 MED ORDER — HALOPERIDOL LACTATE 5 MG/ML IJ SOLN
INTRAMUSCULAR | Status: AC
  Filled 2018-06-27: qty 1

## 2018-06-27 MED ORDER — ACETAMINOPHEN 10 MG/ML IV SOLN
1000.0000 mg | Freq: Once | INTRAVENOUS | Status: AC
  Administered 2018-06-27: 15:00:00 1000 mg via INTRAVENOUS
  Filled 2018-06-27: qty 100

## 2018-06-27 MED ORDER — INSULIN ASPART 100 UNIT/ML ~~LOC~~ SOLN: 2.0000 [IU] | SUBCUTANEOUS | Status: DC

## 2018-06-27 MED ORDER — CEFTRIAXONE SODIUM 1 G IJ SOLR: 1.0000 g | INTRAMUSCULAR | Status: DC

## 2018-06-27 MED ORDER — SODIUM CHLORIDE 0.9 % IV SOLN
3.0000 g | INTRAVENOUS | Status: DC
  Filled 2018-06-27: qty 3

## 2018-06-27 MED ORDER — LEVETIRACETAM IN NACL 1000 MG/100ML IV SOLN
1000.0000 mg | Freq: Once | INTRAVENOUS | Status: AC
  Administered 2018-06-27: 02:00:00 1000 mg via INTRAVENOUS
  Filled 2018-06-27: qty 100

## 2018-06-27 MED ORDER — SODIUM CHLORIDE 0.9 % IV SOLN: 1.0000 g | Freq: Two times a day (BID) | INTRAVENOUS | Status: DC

## 2018-06-27 MED ORDER — METOCLOPRAMIDE HCL 5 MG/ML IJ SOLN
10.0000 mg | Freq: Four times a day (QID) | INTRAMUSCULAR | Status: DC | PRN
  Filled 2018-06-27: qty 2

## 2018-06-27 MED ORDER — THIAMINE HCL 100 MG/ML IJ SOLN
500.0000 mg | Freq: Three times a day (TID) | INTRAVENOUS | Status: DC
  Administered 2018-06-27 – 2018-06-29 (×8): 500 mg via INTRAVENOUS
  Filled 2018-06-27 (×11): qty 5

## 2018-06-27 MED ORDER — DEXTROSE 50 % IV SOLN
25.0000 g | INTRAVENOUS | Status: DC | PRN
  Filled 2018-06-27: qty 50

## 2018-06-27 MED ORDER — DIPHENHYDRAMINE HCL 50 MG/ML IJ SOLN
INTRAMUSCULAR | Status: AC
  Administered 2018-06-27: 50 mg
  Filled 2018-06-27: qty 1

## 2018-06-27 MED ORDER — LORAZEPAM 2 MG/ML IJ SOLN
4.0000 mg | INTRAMUSCULAR | Status: DC | PRN
  Administered 2018-06-27 (×2): 4 mg via INTRAVENOUS
  Filled 2018-06-27 (×3): qty 2

## 2018-06-27 MED ORDER — INSULIN DETEMIR 100 UNIT/ML ~~LOC~~ SOLN
5.0000 [IU] | Freq: Two times a day (BID) | SUBCUTANEOUS | Status: DC
  Administered 2018-06-27 – 2018-06-29 (×5): 5 [IU] via SUBCUTANEOUS
  Filled 2018-06-27 (×8): qty 0.05

## 2018-06-27 MED ORDER — VANCOMYCIN VARIABLE DOSE PER UNSTABLE RENAL FUNCTION (PHARMACIST DOSING): Status: DC

## 2018-06-27 MED ORDER — FOLIC ACID 5 MG/ML IJ SOLN
1.0000 mg | Freq: Every day | INTRAMUSCULAR | Status: DC
  Administered 2018-06-27 – 2018-07-03 (×7): 1 mg via INTRAVENOUS
  Filled 2018-06-27 (×10): qty 0.2

## 2018-06-27 MED ORDER — INSULIN REGULAR(HUMAN) IN NACL 100-0.9 UT/100ML-% IV SOLN
INTRAVENOUS | Status: DC
  Administered 2018-06-27: 06:00:00 3.7 [IU]/h via INTRAVENOUS
  Filled 2018-06-27: qty 100

## 2018-06-27 MED ORDER — LEVETIRACETAM IN NACL 500 MG/100ML IV SOLN: 500.0000 mg | Freq: Two times a day (BID) | INTRAVENOUS | Status: DC

## 2018-06-27 MED ORDER — SODIUM POLYSTYRENE SULFONATE 15 GM/60ML PO SUSP
30.0000 g | Freq: Once | ORAL | Status: DC
  Filled 2018-06-27: qty 120

## 2018-06-27 MED ORDER — CLEVIDIPINE BUTYRATE 0.5 MG/ML IV EMUL
0.0000 mg/h | INTRAVENOUS | Status: DC
  Administered 2018-06-27: 02:00:00 1 mg/h via INTRAVENOUS
  Administered 2018-06-27: 14 mg/h via INTRAVENOUS
  Filled 2018-06-27 (×3): qty 100

## 2018-06-27 MED ORDER — INSULIN ASPART 100 UNIT/ML ~~LOC~~ SOLN
3.0000 [IU] | SUBCUTANEOUS | Status: DC
  Administered 2018-06-28: 9 [IU] via SUBCUTANEOUS
  Administered 2018-06-28 (×2): 6 [IU] via SUBCUTANEOUS
  Administered 2018-06-28: 08:00:00 3 [IU] via SUBCUTANEOUS
  Administered 2018-06-28 (×2): 6 [IU] via SUBCUTANEOUS
  Administered 2018-06-29: 04:00:00 9 [IU] via SUBCUTANEOUS

## 2018-06-27 MED ORDER — DEXTROSE 5 % IV SOLN
10.0000 mg/kg | INTRAVENOUS | Status: DC
  Administered 2018-06-28: 05:00:00 765 mg via INTRAVENOUS
  Filled 2018-06-27 (×2): qty 15.3

## 2018-06-27 MED ORDER — HEPARIN SODIUM (PORCINE) 1000 UNIT/ML IJ SOLN
INTRAMUSCULAR | Status: AC
  Administered 2018-06-28: 04:00:00 3100 [IU] via INTRAVENOUS_CENTRAL
  Filled 2018-06-27: qty 3

## 2018-06-27 MED ORDER — INSULIN ASPART 100 UNIT/ML ~~LOC~~ SOLN
12.0000 [IU] | Freq: Once | SUBCUTANEOUS | Status: AC
  Administered 2018-06-27: 12 [IU] via SUBCUTANEOUS

## 2018-06-27 NOTE — Progress Notes (Signed)
Name: Brent Daniels MRN: [id number] DOB: 1985/06/15 Date of service : 06/27/2018 Chief complaint: AMS   LOS: 1  [facility] Note   History of Present Illness: 33 yo male with hx of ESRD on HD, HTN, DM, past hx of seizures (but no AED's),. Mother says he has had lots of seizures in the past and last was a few months ago, she does not know why he is not on antiepilectic drugs but he used to be on gabapentin in the past. Today he was at a friends house and collapsed , had tonic clonic seizure per the mom , EMS arrived had normal BS , hypertensive on arrival to the ED, post-ictal very confused and agitated , intubated for CT scan.   During my evaluation the sedation was held patient passed SBT and was extubated in the emergency room. He was hypertensive with systolics in the 025 plus range, awake but not following commands and intermittent agitation.  The history is provided by the mother over the phone. ROS is + for nausea and vomiting , which is normal for him , he has gastroparesis. No fevers, chills, night sweats. Rest of 12 point ROS done over the phone with mom and negative.    Lines / Drains: PIV Right HD cath (in prior to admit )   Cultures:  5/20 SARS >Neg    Antibiotics:     Tests / Events:  5/20 CT brain >neg  5/20 C spine negative    Past Medical History:  Diagnosis Date  . Diabetes mellitus without complication ([place])   . Renal disorder     History reviewed. No pertinent surgical history.  Prior to Admission medications   Not on File    Allergies No Known Allergies  Family History History reviewed. No pertinent family history.  Social History  has no history on file for tobacco, alcohol, and drug.  He has hx of opioid abuse and has been on suboxone.  Review Of Systems:   Vital Signs: Temp:  [97 F (36.1 C)-98.5 F (36.9 C)] 97.6 F (36.4 C) (05/21 0715) Pulse Rate:  [77-119] 99 (05/21 0715) Resp:  [16-29] 20 (05/21  0715) BP: (137-254)/(86-153) 147/86 (05/21 0715) SpO2:  [93 %-100 %] 98 % (05/21 0715) FiO2 (%):  [40 %-60 %] 40 % (05/20 2324) Weight:  [72.6 kg-76.6 kg] 76.6 kg (05/21 0441) I/O last 3 completed shifts: In: 334.8 [I.V.:184.8; IV Piggyback:150] Out: 975 [Urine:975]  Physical Examination: General:  Sedated , intermittent agitation , restraints  HEENT: NCAT, oral mucosa pink and moist . Neck: Supple. No JVD  Lungs: CTA , no wheezing , O2 sats 100% on 2 l/m  Cardiovascular: ST , 2/6 SM  Abdomen: soft , nt , BS + .  Extremities: warm, dry , no edema  Skin: intact, no rash  MSK/neuro -sedated, maex 4    Ventilator settings: Vent Mode: PRVC FiO2 (%):  [40 %-60 %] 40 % Set Rate:  [16 bmp] 16 bmp Vt Set:  [620 mL] 620 mL PEEP:  [5 cmH20] 5 cmH20 Plateau Pressure:  [17 cmH20] 17 cmH20  Labs    CBC Recent Labs  Lab 06/26/18 2200 06/26/18 2320 06/27/18 0509  HGB 12.2* 11.9* 12.3*  HCT 38.0* 35.0* 38.3*  WBC 12.6*  --  21.2*  PLT 284  --  245     BMET Recent Labs  Lab 06/26/18 2200 06/26/18 2320 06/27/18 0509  NA 140 139 135  K 4.7 4.5 6.6*  CL 105  --  99  CO2 22  --  19*  GLUCOSE 68*  --  470*  BUN 58*  --  58*  CREATININE 7.73*  --  7.75*  CALCIUM 9.9  --  9.3  MG  --   --  2.0  PHOS  --   --  6.5*    Recent Labs  Lab 06/26/18 2200  INR 1.1    Recent Labs  Lab 06/26/18 2320  PHART 7.383  PCO2ART 42.6  PO2ART 248.0*  HCO3 25.3  TCO2 27  O2SAT 100.0     Radiology: CXR no consolidations  5/20 CT BRAIN AND C SPINE - neg acute       Assessment and Plan: Principal Problem:   Encephalopathy acute Active Problems:   Seizures (HCC)   Hypertensive emergency   ESRD (end stage renal disease) on dialysis ([place])   Diabetes type 2, uncontrolled ([place])  Neuro - acute encephalopathy secondary to post-ictal state , Seizure disorder - ?not on meds at home (suspect this is secondary to uncontrolled HTN) ETOH neg , Cocaine ++ Plan  EEG  Pend  neuro  consult precedex for agitation Etoh level negative Cleviprex -titrate for sbp <505  Thiamine /folic acid   Ativan prn   Hypertensive emergency  ,  Plan  Hydrlazine and labetalol As needed    Respiratory Insufficiency : Extubated in ER 5/21  Plan  O2 to keep sats >90%.  Low threshold to intubate if unable to protect airway     ESRD on HD -non compliant (Last HD ?5/15)  Hyperkalemia  Plan  Consult  renal  Kayexlate pending    DM2 -hyperglycemia  Plan  Insulin drip    Id -  off abx Afebrile , wbc tr up , chest xray nad 5/20  Plan  Tr temp/wbc  Check PCT    Best practices / Disposition: -->Code Status: full -->DVT Px:hsq -->GI Px:not indicated -->Diet:NPO      NP-C  [facility]  [phone]  06/27/2018, 10:01 AM

## 2018-06-27 NOTE — Progress Notes (Signed)
Pharmacy Antibiotic Note  Brent Daniels is a 33 y.o. male admitted on 06/26/2018 with HTN emergency and noted with fever. Pharmacy has been consulted for unasyn and vancomycin for meningitis and concern of aspiration PNA.  He is noted with ESRD and plans for HD 5/21 (300 BFR; 3.5 hrs) -WBC= 2.2, tmax= 102.4, PCT= 2.27  Plan: -vancomycin 1500mg  IV now then 750mg  after HD today -change unasyn to ceftriaxone 2mg  q12h  Add acyclovir 10mg  q24h  -Will follow cultures and clinical progress   Height: 6' (182.9 cm) Weight: 168 lb 14 oz (76.6 kg) IBW/kg (Calculated) : 77.6  Temp (24hrs), Avg:98.9 F (37.2 C), Min:97 F (36.1 C), Max:102.4 F (39.1 C)  Recent Labs  Lab 06/26/18 2200 06/27/18 0509 06/27/18 1550  WBC 12.6* 21.2*  --   CREATININE 7.73* 7.75*  --   LATICACIDVEN  --   --  0.8    Estimated Creatinine Clearance: 14.6 mL/min (A) (by C-G formula based on SCr of 7.75 mg/dL (H)).    No Known Allergies  Antimicrobials this admission: 5/21 unasyn>>5/21 5/21 ceftriaxone > 5/21 vanc>> 5/21 acyclovir >  Dose adjustments this admission:   Microbiology results: 5/21 blood x2  Bonnita Nasuti Pharm.D. CPP, BCPS Clinical Pharmacist [phone] 06/27/2018 4:34 PM

## 2018-06-27 NOTE — Progress Notes (Addendum)
Pharmacy Antibiotic Note  Brent Daniels is a 33 y.o. male admitted on 06/26/2018 with HTN emergency and noted with fever. Pharmacy has been consulted for unasyn and vancomycin for meningitis and concern of aspiration PNA.  He is noted with ESRD and plans for HD 5/21 (300 BFR; 3.5 hrs) -WBC= 2.2, tmax= 102.4, PCT= 2.27  Plan: -vancomycin 1500mg  IV now then 750mg  after HD today -Unasyn 3gm IV q24h -Will follow cultures and clinical progress   Height: 6' (182.9 cm) Weight: 168 lb 14 oz (76.6 kg) IBW/kg (Calculated) : 77.6  Temp (24hrs), Avg:98.6 F (37 C), Min:97 F (36.1 C), Max:102.4 F (39.1 C)  Recent Labs  Lab 06/26/18 2200 06/27/18 0509  WBC 12.6* 21.2*  CREATININE 7.73* 7.75*    Estimated Creatinine Clearance: 14.6 mL/min (A) (by C-G formula based on SCr of 7.75 mg/dL (H)).    No Known Allergies  Antimicrobials this admission: 5/21 unasyn>> 5/21 vanc>>  Dose adjustments this admission:   Microbiology results: 5/21 blood x2  Thank you for allowing pharmacy to be a part of this patient's care.  Hildred Laser, PharmD Clinical Pharmacist **Pharmacist phone directory can now be found on [email or web address] ([email or web address]).  Listed under [place].

## 2018-06-27 NOTE — Procedures (Signed)
ELECTROENCEPHALOGRAM REPORT   Patient: Brent Daniels       Room #: [facility] EEG No. ID: [id number] Age: 33 y.o.        Sex: male Referring Physician: Claudie Leach Report Date:  06/27/2018        Interpreting Physician: Alexis Goodell  History: MAAN ZARCONE is an 33 y.o. male with altered mental status and seizures  Medications:  Vancomycin, Thiamine, Insulin, Acyclovir, Rocephin, Cleviprex, Precedex  Conditions of Recording:  This is a 21 channel routine scalp EEG performed with bipolar and monopolar montages arranged in accordance to the international 10/20 system of electrode placement. One channel was dedicated to EKG recording.  The patient is in the intubated and sedated state.  Description:  The background activity is slow and poorly organized.  It consists of a low voltage, polymorphic delta activity that is continuous and diffusely distributed.  There is frequent superimposed low voltage, fairly well organized, fast beta activity noted symmetrically as well seen intermittently.   No epileptiform activity is noted.  Hyperventilation and intermittent photic stimulation were not performed.  IMPRESSION: This is an abnormal electroencephalogram characterized by general background slowing with superimposed beta activity consistent with current medications.  No epileptiform activity is noted.     Alexis Goodell, MD Neurology [phone] 06/27/2018, 6:34 PM

## 2018-06-27 NOTE — Consult Note (Signed)
Reason for Consult: Continuity of ESRD care Referring Physician: June Leap, MD (CCM)  HPI: (History obtained from review of chart as patient is encephalopathic). 33 year old Caucasian man with past medical history significant for diabetes mellitus, hypertension, possible polysubstance abuse including chronic narcotic dependency and end-stage renal disease on hemodialysis.  Brought to the emergency room yesterday by EMS after a witnessed seizure at his friend's house.  Upon arrival by EMS, found to be postictal/encephalopathic with hypertensive urgency.  He was extubated this morning after overnight intubation for airway protection.  I am attempting to obtain additional information regarding his ESRD history/prescription by calling the [facility] units in [place] in [place]. It is unclear when he last went to dialysis and labs are significant for hyperkalemia with concomitant hyperglycemia this morning.  Past Medical History:  Diagnosis Date  . Diabetes mellitus without complication ([place])   . Renal disorder     History reviewed. No pertinent surgical history.  History reviewed. No pertinent family history.  Social History:  has no history on file for tobacco, alcohol, and drug.  Allergies: No Known Allergies  Medications:  Scheduled: . [START ON 06/28/2018] Chlorhexidine Gluconate Cloth  6 each Topical Q0600  . folic acid  1 mg Intravenous Daily  . heparin  5,000 Units Subcutaneous Q8H  . sodium polystyrene  30 g Rectal Once    BMP Latest Ref Rng & Units 06/27/2018 06/26/2018 06/26/2018  Glucose 70 - 99 mg/dL 470(H) - 68(L)  BUN 6 - 20 mg/dL 58(H) - 58(H)  Creatinine 0.61 - 1.24 mg/dL 7.75(H) - 7.73(H)  Sodium 135 - 145 mmol/L 135 139 140  Potassium 3.5 - 5.1 mmol/L 6.6(HH) 4.5 4.7  Chloride 98 - 111 mmol/L 99 - 105  CO2 22 - 32 mmol/L 19(L) - 22  Calcium 8.9 - 10.3 mg/dL 9.3 - 9.9   CBC Latest Ref Rng & Units 06/27/2018 06/26/2018 06/26/2018  WBC 4.0 - 10.5 K/uL 21.2(H) - 12.6(H)   Hemoglobin 13.0 - 17.0 g/dL 12.3(L) 11.9(L) 12.2(L)  Hematocrit 39.0 - 52.0 % 38.3(L) 35.0(L) 38.0(L)  Platelets 150 - 400 K/uL 245 - 284     Ct Head Wo Contrast  Result Date: 06/26/2018 CLINICAL DATA:  33 year old male found down after seizure like activity. Head trauma. EXAM: CT HEAD WITHOUT CONTRAST CT CERVICAL SPINE WITHOUT CONTRAST TECHNIQUE: Multidetector CT imaging of the head and cervical spine was performed following the standard protocol without intravenous contrast. Multiplanar CT image reconstructions of the cervical spine were also generated. COMPARISON:  None. FINDINGS: CT HEAD FINDINGS Brain: Prominent lateral ventricle size with a degree of colpocephaly although the temporal horns remain fairly diminutive. Third and especially 4th ventricle size is within normal limits. No evidence of transependymal edema. No midline shift, mass effect, or evidence of intracranial mass lesion. Normal basilar cisterns. No acute intracranial hemorrhage identified. Gray-white matter differentiation is within normal limits throughout the brain. No cortically based acute infarct identified. Vascular: No suspicious intracranial vascular hyperdensity. Skull: Intact. Sinuses/Orbits: Visualized paranasal sinuses are well pneumatized. The tympanic cavities are clear, but there is partial left mastoid opacification with chronic appearing mastoid coalescence and sclerosis. The right mastoids are clear. Other: Intubated on the scout view with fluid in the pharynx. Orbit and scalp soft tissues appear within normal limits. CT CERVICAL SPINE FINDINGS Alignment: Normal cervical lordosis. Cervicothoracic junction alignment is within normal limits. Bilateral posterior element alignment is within normal limits. Skull base and vertebrae: Visualized skull base is intact. No atlanto-occipital dissociation. No acute osseous abnormality identified. Soft  tissues and spinal canal: No prevertebral fluid or swelling. No visible canal  hematoma. Endotracheal tube courses appropriately into the trachea, tip not included. Enteric tube courses into the esophagus. Negative noncontrast neck soft tissues. Disc levels:  Minor endplate spurring. Upper chest: Visible upper thoracic levels appear grossly intact. Partially visible dual lumen right IJ approach catheter. There is confluent dependent opacity in the left lung apex on series 8, image 95. Negative right lung apex. IMPRESSION: 1. No acute traumatic injury identified in the head or cervical spine. 2. Ventricular prominence with Colpocephaly, but otherwise negative noncontrast CT appearance of the brain. 3. Dependent opacity in the left upper lobe. Favor atelectasis over aspiration or pneumonia. 4. Visible ET and enteric tubes placed as expected. 5. Chronic postinflammatory changes of the left mastoid air cells. Electronically Signed   By: Genevie Ann M.D.   On: 06/26/2018 22:50   Ct Cervical Spine Wo Contrast  Result Date: 06/26/2018 CLINICAL DATA:  33 year old male found down after seizure like activity. Head trauma. EXAM: CT HEAD WITHOUT CONTRAST CT CERVICAL SPINE WITHOUT CONTRAST TECHNIQUE: Multidetector CT imaging of the head and cervical spine was performed following the standard protocol without intravenous contrast. Multiplanar CT image reconstructions of the cervical spine were also generated. COMPARISON:  None. FINDINGS: CT HEAD FINDINGS Brain: Prominent lateral ventricle size with a degree of colpocephaly although the temporal horns remain fairly diminutive. Third and especially 4th ventricle size is within normal limits. No evidence of transependymal edema. No midline shift, mass effect, or evidence of intracranial mass lesion. Normal basilar cisterns. No acute intracranial hemorrhage identified. Gray-white matter differentiation is within normal limits throughout the brain. No cortically based acute infarct identified. Vascular: No suspicious intracranial vascular hyperdensity. Skull:  Intact. Sinuses/Orbits: Visualized paranasal sinuses are well pneumatized. The tympanic cavities are clear, but there is partial left mastoid opacification with chronic appearing mastoid coalescence and sclerosis. The right mastoids are clear. Other: Intubated on the scout view with fluid in the pharynx. Orbit and scalp soft tissues appear within normal limits. CT CERVICAL SPINE FINDINGS Alignment: Normal cervical lordosis. Cervicothoracic junction alignment is within normal limits. Bilateral posterior element alignment is within normal limits. Skull base and vertebrae: Visualized skull base is intact. No atlanto-occipital dissociation. No acute osseous abnormality identified. Soft tissues and spinal canal: No prevertebral fluid or swelling. No visible canal hematoma. Endotracheal tube courses appropriately into the trachea, tip not included. Enteric tube courses into the esophagus. Negative noncontrast neck soft tissues. Disc levels:  Minor endplate spurring. Upper chest: Visible upper thoracic levels appear grossly intact. Partially visible dual lumen right IJ approach catheter. There is confluent dependent opacity in the left lung apex on series 8, image 95. Negative right lung apex. IMPRESSION: 1. No acute traumatic injury identified in the head or cervical spine. 2. Ventricular prominence with Colpocephaly, but otherwise negative noncontrast CT appearance of the brain. 3. Dependent opacity in the left upper lobe. Favor atelectasis over aspiration or pneumonia. 4. Visible ET and enteric tubes placed as expected. 5. Chronic postinflammatory changes of the left mastoid air cells. Electronically Signed   By: Genevie Ann M.D.   On: 06/26/2018 22:50   Dg Chest Port 1 View  Result Date: 06/27/2018 CLINICAL DATA:  Fever and hypertension EXAM: PORTABLE CHEST 1 VIEW COMPARISON:  06/26/2018 FINDINGS: Endotracheal tube and nasogastric catheter have been removed. Dialysis catheter is again seen in satisfactory position.  Lungs are well aerated bilaterally. Mild right basilar atelectasis is seen. No acute bony abnormality is noted.  Cardiac shadow is within normal limits. IMPRESSION: Mild right basilar atelectasis. Electronically Signed   By: Inez Catalina M.D.   On: 06/27/2018 12:35   Dg Chest Portable 1 View  Result Date: 06/26/2018 CLINICAL DATA:  33 year old male level 1 trauma, head injury. Intubated. EXAM: PORTABLE CHEST 1 VIEW COMPARISON:  None. FINDINGS: Portable AP supine view at 2202 hours. Endotracheal tube tip in good position at the level the clavicles. Enteric tube courses to the left abdomen, tip not included. Superimposed right chest tunneled type dual lumen dialysis catheter. The patient is rotated to the left. Low lung volumes. Normal cardiac size and mediastinal contours. Allowing for portable technique the lungs are clear. Paucity bowel gas in the upper abdomen. No acute osseous abnormality identified. IMPRESSION: 1. ET tube tip in good position. Enteric tube courses to the left abdomen, tip not included. Right chest dialysis type catheter also in place. 2. No acute cardiopulmonary abnormality. Electronically Signed   By: Genevie Ann M.D.   On: 06/26/2018 22:30    Review of Systems  Unable to perform ROS: Mental status change   Blood pressure (!) 143/81, pulse 80, temperature (!) 102.4 F (39.1 C), temperature source Oral, resp. rate (!) 22, height 6' (1.829 m), weight 76.6 kg, SpO2 99 %. Physical Exam  Nursing note and vitals reviewed. Constitutional: He appears well-developed and well-nourished.  HENT:  Head: Normocephalic.  Mouth/Throat: Oropharynx is clear and moist.  Eyes: Pupils are equal, round, and reactive to light. Conjunctivae are normal.  Neck: Neck supple. No JVD present.  Cardiovascular: Normal rate, regular rhythm and normal heart sounds.  No murmur heard. Respiratory: Effort normal and breath sounds normal. He has no wheezes. He has no rales.  Right IJ tunneled hemodialysis  catheter  GI: Soft. He exhibits no distension. There is no abdominal tenderness.  Musculoskeletal:        General: No edema.  Neurological:  Encephalopathic/unresponsive to voice.  Localizes pain.  Skin: Skin is warm and dry. No rash noted.    Assessment/Plan: 1.  Encephalopathy: Possibly multifactorial from postictal state/substance use/withdrawal.  Ongoing monitoring in stepdown unit with sitter at bedside. 2.  Hyperkalemia: This appears to be primarily from concomitant hyperglycemia and should improve with management of the latter.  Monitor with hemodialysis. 3.  Hypertensive urgency: Likely exacerbated by cocaine use, status post antihypertensive therapy and will monitor with hemodialysis. 4.  End-stage renal disease: Prescribed for hemodialysis today, will use slower blood flow rates as I am not sure of when he had his last dialysis treatment and would like to avoid disequilibrium. 5.  Anemia of chronic kidney disease: Hemoglobin currently at goal 6.  Secondary hyperparathyroidism: Elevated phosphorus level noted, resume binder when mental status allows consistent oral intake.  Obtain records from outpatient dialysis unit regarding VDRA/calcimimetic   Sable Knoles K. 06/27/2018, 1:20 PM

## 2018-06-27 NOTE — Progress Notes (Signed)
4mg  of artivan, 50mg  of benadryl and 4mg  of haldol given Per Dr. Aurea Graff

## 2018-06-27 NOTE — ED Provider Notes (Signed)
I saw and evaluated the patient, reviewed the resident's note and I agree with the findings and plan. I directly supervised the intubation by Dr. Marijean Bravo  EKG: None   Patient presents as level 1 trauma. Patient is quite altered and combative. Mildly low glucose, given D50 but still no improvement in mental status. Given IV ativan but still not following commands. Given concern for possible head injury with fall at home and possible seizure, he was intubated for airway protection and to expedite head CT. Head CT and initial workup unremarkable. Probable seizure but unclear. Will need ICU admission.   CRITICAL CARE Performed by: Ephraim Hamburger   Total critical care time: 40 minutes  Critical care time was exclusive of separately billable procedures and treating other patients.  Critical care was necessary to treat or prevent imminent or life-threatening deterioration.  Critical care was time spent personally by me on the following activities: development of treatment plan with patient and/or surrogate as well as nursing, discussions with consultants, evaluation of patient's response to treatment, examination of patient, obtaining history from patient or surrogate, ordering and performing treatments and interventions, ordering and review of laboratory studies, ordering and review of radiographic studies, pulse oximetry and re-evaluation of patient's condition.    Sherwood Gambler, MD 06/28/18 [phone]

## 2018-06-27 NOTE — Progress Notes (Addendum)
CSF results Appearance clear, glucose 133, protein 34, WBC count 113 with 95% segmented neutrophils, 1% lymphocytes, 4% monocytes Serum glucose within normal range at bedtime (earlier in the day was in the 400s)  This is less likely bacterial and more likely viral encephalitis as viral encephalitis in early stages can have neutrophilic predominance rather than lymphocytic predominance.  The only thing going against it is a normal protein.  I have added cryptococcal antigen studies to the CSF.  I have discussed this with Dr. Valeta Harms, and also obtained a phone consultation with infectious diseases specialist on call.  They agree with treating this as an infectious meningoencephalitis therefore the results come back.  Other etiologies for the rather atypical CSF findings could be PRES or drug use (but rarely does that happen with normal protein level in CSF).  I communicated my plan to Dr. Valeta Harms after discussion with ID as well.  -- Amie Portland, MD [facility] Pager: [phone] If 7pm to 7am, please call on call as listed on [facility]ION.

## 2018-06-27 NOTE — Progress Notes (Signed)
[facility] Progress Note Patient Name: Brent Daniels DOB: Apr 29, 1985 MRN: [id number]   Date of Service  06/27/2018  HPI/Events of Note  Multiple issues: 1. K+ = 6.7 and 2. Blood glucose = 470.   eICU Interventions  Will order: 1. Kayexalate enema 30 gm PR now.  2. Please notify Nephrology of K+ result so that they can arrange HD. 3. Insulin IV infusion.      Intervention Category Major Interventions: Hyperglycemia - active titration of insulin therapy;Electrolyte abnormality - evaluation and management  Sommer,Steven Cornelia Copa 06/27/2018, 5:56 AM

## 2018-06-27 NOTE — Progress Notes (Addendum)
eLink Physician-Brief Progress Note Patient Name: Brent Daniels DOB: 1985/05/30 MRN: [id number]   Date of Service  06/27/2018  HPI/Events of Note  Notified of hyperglycemia with BG at 296.  Pt is standard sliding scale coverage.  eICU Interventions  Increase coverage to resistant scale.     Intervention Category Intermediate Interventions: Hyperglycemia - evaluation and treatment  Elsie Lincoln 06/27/2018, 8:32 PM   8:55 PM  Repeat check on POC glucose >300. Give 12 units novolog now. Continue checking FS q4hrs.  Correct with resistant scale.   4:15 AM At the time of my evaluation, pt is resting comfortably and is on dialysis. BP 111/76, HR 65, RR 17, 100% O2 sats.  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

## 2018-06-27 NOTE — Consult Note (Addendum)
Neurology Consultation  Reason for Consult: Altered mental status, seizures Referring Physician: Dr. Valeta Harms  CC: Altered mental status, seizures  History is obtained from: Chart review, patient's mother over the phone  HPI: Brent Daniels is a 33 y.o. male past medical history of diabetes, ESRD on dialysis, opiate dependence, possible substance abuse has in past been in rehab for that, was in usual state of health sometime yesterday at a friend's place when he suddenly collapsed and started having a generalized tonic-clonic seizure-like activity witnessed by the friend's mother.  The patient's mother did not witness the event and is relying what she heard from the bystander there.  He was intubated because he was very agitated and delirious and would not stay still for the CT-there was concern for ICH because of systolic blood pressures in the 230s to 240s.  CT head was done and was unremarkable.  He was extubated overnight but continues to be encephalopathic. The mother said he has a history of seizures.  On further history taking, she says that he was taking gabapentin for pain and because of his worsening renal function he started having some jerking movements which at times progressed to seizures.  They discontinued his gabapentin and start him on dialysis and the seizures did not recur after that.  He is never seen a neurologist for that. She does not know of any drug history that he might have but his urinary toxicology screen was positive for cocaine.  He was also positive for benzodiazepines, but I have to presume that that was because he was given benzodiazepines for the presumed seizure during the treatment by EMS and ER. The patient even after intubation, continues to be intermittently delirious, agitated.  His systolic blood pressures on arrival were in the 200s.  Initial CBG in the chart is 60 and then gradually increased to over 400s.  CBG is now back trending towards normal. Labs on  arrival white count was 12.2 but it is now increased to 21.2. Chest x-ray after extubation revealed no active disease but the CT head and C-spine showed some dependent fluid versus aspiration versus atelectasis and the left lung.  Neurological consultation was obtained for the seizure and further management of the altered mental status.  LKW: Unclear tpa given?: no, toxic metabolic encephalopathy more likely than stroke Premorbid modified Rankin scale (mRS):0   ROS  Unable to obtain due to altered mental status.   Past Medical History:  Diagnosis Date  . Diabetes mellitus without complication ([place])   . Renal disorder    History reviewed. No pertinent family history. No family history of seizures  Social History:   has no history on file for tobacco, alcohol, and drug.  To the mother, he has a history of tobacco abuse, opiate abuse.  To mother's knowledge, he does not do any drugs but urinary toxicology screen was positive for cocaine indicating there might be a history of cocaine abuse. Medications  Current Facility-Administered Medications:  .  clevidipine (CLEVIPREX) infusion 0.5 mg/mL, 0-21 mg/hr, Intravenous, Continuous, Corey Harold, NP, Last Rate: 28 mL/hr at 06/27/18 0726, 14 mg/hr at 06/27/18 0726 .  dexmedetomidine (PRECEDEX) 200 MCG/50ML (4 mcg/mL) infusion, 0.4-1 mcg/kg/hr, Intravenous, Continuous, Anders Simmonds, MD, Last Rate: 18.15 mL/hr at 06/27/18 0943, 1 mcg/kg/hr at 06/27/18 0943 .  dextrose 50 % solution 25 g, 25 g, Intravenous, Q1H PRN, Roxanne Mins, MD .  folic acid injection 1 mg, 1 mg, Intravenous, Daily, Roxanne Mins, MD, 1 mg  at 06/27/18 0957 .  heparin injection 5,000 Units, 5,000 Units, Subcutaneous, Q8H, Hoffman, Silvestre Moment, NP .  hydrALAZINE (APRESOLINE) injection 10-20 mg, 10-20 mg, Intravenous, Q4H PRN, Corey Harold, NP .  insulin regular, human (MYXREDLIN) 100 units/ 100 mL infusion, , Intravenous, Continuous, Anders Simmonds, MD, Last Rate:  0.9 mL/hr at 06/27/18 1054, 0.9 Units/hr at 06/27/18 1054 .  labetalol (NORMODYNE) injection 20 mg, 20 mg, Intravenous, Q10 min PRN, Roxanne Mins, MD .  LORazepam (ATIVAN) injection 4 mg, 4 mg, Intravenous, Q2H PRN, Roxanne Mins, MD .  metoCLOPramide (REGLAN) injection 10 mg, 10 mg, Intravenous, Q6H PRN, Roxanne Mins, MD .  sodium polystyrene (KAYEXALATE) 15 GM/60ML suspension 30 g, 30 g, Rectal, Once, Anders Simmonds, MD .  thiamine 500mg  in normal saline (80ml) IVPB, 500 mg, Intravenous, Q8H, Roxanne Mins, MD, Stopped at 06/27/18 0357  Exam: Current vital signs: BP 135/80   Pulse (!) 101   Temp 97.6 F (36.4 C) (Oral)   Resp (!) 23   Ht 6' (1.829 m)   Wt 76.6 kg   SpO2 99%   BMI 22.90 kg/m  Vital signs in last 24 hours: Temp:  [97 F (36.1 C)-98.5 F (36.9 C)] 97.6 F (36.4 C) (05/21 0715) Pulse Rate:  [77-119] 101 (05/21 1000) Resp:  [16-29] 23 (05/21 1000) BP: (135-254)/(80-153) 135/80 (05/21 1000) SpO2:  [93 %-100 %] 99 % (05/21 1000) FiO2 (%):  [40 %-60 %] 40 % (05/20 2324) Weight:  [72.6 kg-76.6 kg] 76.6 kg (05/21 0441) General: On minimal sedation with Precedex, had been extubated, no apparent distress HEENT: Cephalic atraumatic Lungs: Clear CVS: S1-2 heard regular rate rhythm Abdomen: Soft nondistended nontender Extremities: Warm well perfused Neurological exam Mental status: Patient is very drowsy, only moans to noxious stimulation.  Does not open eyes to voice.  Does not follow commands. Cranial nerves: Pupils equal round react light, oculocephalics present, corneals present, breathing spontaneously, face symmetric. Motor exam: Grimaces but does not really withdraw on any of the 4 extremities to noxious stimulation. Sensory exam as above Coordination cannot be tested   Labs I have reviewed labs in epic and the results pertinent to this consultation are:  CBC    Component Value Date/Time   WBC 21.2 (H) 06/27/2018 0509   RBC 4.05 (L) 06/27/2018  0509   HGB 12.3 (L) 06/27/2018 0509   HCT 38.3 (L) 06/27/2018 0509   PLT 245 06/27/2018 0509   MCV 94.6 06/27/2018 0509   MCH 30.4 06/27/2018 0509   MCHC 32.1 06/27/2018 0509   RDW 17.2 (H) 06/27/2018 0509   LYMPHSABS 2.8 06/26/2018 2200   MONOABS 0.9 06/26/2018 2200   EOSABS 0.6 (H) 06/26/2018 2200   BASOSABS 0.1 06/26/2018 2200   CMP     Component Value Date/Time   NA 135 06/27/2018 0509   K 6.6 (HH) 06/27/2018 0509   CL 99 06/27/2018 0509   CO2 19 (L) 06/27/2018 0509   GLUCOSE 470 (H) 06/27/2018 0509   BUN 58 (H) 06/27/2018 0509   CREATININE 7.75 (H) 06/27/2018 0509   CALCIUM 9.3 06/27/2018 0509   PROT 7.5 06/26/2018 2200   ALBUMIN 4.3 06/26/2018 2200   AST 23 06/26/2018 2200   ALT 19 06/26/2018 2200   ALKPHOS 83 06/26/2018 2200   BILITOT 0.7 06/26/2018 2200   GFRNONAA 8 (L) 06/27/2018 0509   GFRAA 10 (L) 06/27/2018 0509  Urinalysis with protein greater than 300, small amount of ketones but no evidence of infection.  Urine glucose greater than 500.  Imaging I have reviewed the images obtained:  CT-scan of the brain -no acute changes  Assessment: 33 year old man with past medical history of end-stage renal disease, diabetes, substance abuse, brought in after a sudden collapse at a friend house followed by witnessed described generalized tonic-clonic activity by a bystander-the friend's mother. Patient has had seizures in the past which have been described as mother is a reaction to gabapentin in the setting of renal dysfunction-this sounds more like gabapentin induced myoclonic movements rather than seizures but she says that he used to first developed some jerking movements and followed by full body seizures. Mother was unaware of any illicit drug use history and I have not revealed any test results to her but the urinary drug screen obtained on admission was positive for cocaine. Patient was also extremely hypertensive and his blood sugars swung from 60s to 400s. I  suspect that currently he has toxic metabolic encephalopathy secondary to multiple causes such as hypertensive emergency, hypoglycemia/hyperglycemia as well as secondary to cocaine abuse. Underlying seizures cannot be ruled out but based on my exam, he did not have any gaze deviation or preference with no focal deficits to suggest underlying seizures.  Electrographic studies would be helpful. Although the chest x-ray on arrival was not suggestive of her respiratory abnormality, the CT C-spine part that visualized the chest did show some opacification of the right lung.  This should also be pursued further  Impression: Brought in with "seizures"- has history of seizures secondary to use of gabapentin and renal failure. Toxic metabolic encephalopathy End-stage renal disease Evaluate for other sources of infection  Recommendations: I would hold off on using any antiepileptics now that she was given a load of IV Keppra in the morning. Obtain an EEG MRI when he is stable enough Frequent neuro exams Check chest x-ray Management of the hyperglycemia/hypoglycemia per primary team Management of hypertensive emergency per primary team.  Would recommend slowly bringing the blood pressures down to goal below 180.  His blood pressures have been rapidly reduced to 120, which in a hypertensive urgency setting could be counterproductive. I do not have a clear explanation for his leukocytosis, could be reactive if he has underlying seizures versus from a chest infection.  There could be a possibility of a CNS infection but there was no preceding illness sickness and no exam findings of meningismus to make me suspect CNS infection is the first differential. I will continue to follow him with you.  -- Amie Portland, MD [facility] Pager: [phone] If 7pm to 7am, please call on call as listed on AMION.   Addendum Shortly after my exam, I was informed that the patient now spiked a temperature  greater than 102 Fahrenheit. I would wait for the chest x-ray.  If there is no other source like a pneumonia or UTI to explain that and the leukocytosis, I think at that point he might need a spinal tap to look for a CNS source of infection.   CRITICAL CARE ATTESTATION Performed by: Amie Portland, MD Total critical care time: 45 minutes Critical care time was exclusive of separately billable procedures and treating other patients and/or supervising APPs/Residents/Students Critical care was necessary to treat or prevent imminent or life-threatening deterioration due to toxic metabolic encephalopthy, concern for seizures  This patient is critically ill and at significant risk for neurological worsening and/or death and care requires constant monitoring. Critical care was time spent personally by me on the following activities:  development of treatment plan with patient and/or surrogate as well as nursing, discussions with consultants, evaluation of patient's response to treatment, examination of patient, obtaining history from patient or surrogate, ordering and performing treatments and interventions, ordering and review of laboratory studies, ordering and review of radiographic studies, pulse oximetry, re-evaluation of patient's condition, participation in multidisciplinary rounds and medical decision making of high complexity in the care of this patient.

## 2018-06-27 NOTE — Progress Notes (Signed)
Brief note:  RN call , Fever 102.4  CXR showed R Basilar atx  PCT elevated 2.2.   Plan  BC x 2  Begin empiric Unasyn ? Asp PNA  Check cxr and Lactate in am .  Tr wbc /temp  Tylenol  Tr pct .   Lennox Leikam NP-C  [facility]  [phone]  06/27/2018

## 2018-06-27 NOTE — Progress Notes (Addendum)
eLink Physician-Brief Progress Note Patient Name: Brent Daniels DOB: 14-Feb-1985 MRN: [id number]   Date of Service  06/27/2018  HPI/Events of Note  Delirium - 5 nurses restraining patient at bedside. Nursing reports history of seizures. No anti seizure medications on medication list. QTc interval = 0.43. The already ordered Precedex IV infusion just started.   eICU Interventions  Will order: 1. Haldol 1 mg IV now.  2. Load with Keppra 1000 mg  IV now, then 500 mg IV Q 12 hours.  3. Further management per Neurology consultant recommendations.      Intervention Category Major Interventions: Delirium, psychosis, severe agitation - evaluation and management;Seizures - evaluation and management  [name] 06/27/2018, 1:08 AM

## 2018-06-27 NOTE — Progress Notes (Signed)
Hemodialysis called to find out the estimated time they will come dialyze patient and Nurse stated she will be here after midnight and asked to turn Vancomycin medication back on. Pharmacy called to notify of delayed Vancomycin 1500mg /540ml and acyclovir doses and stated reason was due to patient having hemodialysis. Pharmacy advised to restart medications and change times for medications as patient needs antibiotics for current condition. Vancomycin infusion restarted at 2115. Pharmacy also stated Vancomycin 750mg /156ml bag can be given before or during Hemodialysis.

## 2018-06-27 NOTE — ED Provider Notes (Addendum)
[facility]IOV[facility] Provider Note   CSN: [id number] Arrival date & time: 06/26/18  2157    History   Chief Complaint Chief Complaint  Patient presents with   Trauma    HPI KEYANTE DURIO is a 33 y.o. male. With a pmhx of ESRD, HTN who presents to the ED as a level 1 trauma after a suspected fall and possible seizure/head trauma. Story is obtained via EMS as patient is altered. Per EMS family heard a loud noise "like patient fell" and found patient having a seizure like episode on the ground. Unclear if patient has a seizure disorder. Family also did not know if patient had missed HD.      HPI  Past Medical History:  Diagnosis Date   Diabetes mellitus without complication ([place])    Renal disorder     Patient Active Problem List   Diagnosis Date Noted   Seizures ([place]) 06/27/2018   Hypertensive emergency 06/27/2018   ESRD (end stage renal disease) on dialysis ([place]) 06/27/2018   Diabetes type 2, uncontrolled ([place]) 06/27/2018   Encephalopathy acute 06/26/2018    History reviewed. No pertinent surgical history.      Home Medications    Prior to Admission medications   Not on File    Family History History reviewed. No pertinent family history.  Social History Social History   Tobacco Use   Smoking status: Unknown If Ever Smoked  Substance Use Topics   Alcohol use: Not on file   Drug use: Not on file     Allergies   Patient has no known allergies.   Review of Systems Review of Systems  Unable to perform ROS: Mental status change     Physical Exam Updated Vital Signs BP 135/80    Pulse (!) 101    Temp 97.6 F (36.4 C) (Oral)    Resp (!) 23    Ht 6' (1.829 m)    Wt 76.6 kg    SpO2 99%    BMI 22.90 kg/m   Physical Exam Vitals signs and nursing note reviewed.  Constitutional:      General: He is in acute distress.     Appearance: He is well-developed. He is not diaphoretic.  HENT:     Head: Normocephalic and atraumatic.      Comments: No external signs of head trauma Eyes:     Conjunctiva/sclera: Conjunctivae normal.     Comments: Right pupil 4 and reactive left pupil 3 and reactive.  Neck:     Musculoskeletal: Neck supple.     Comments: c-collar placed on arrival Cardiovascular:     Rate and Rhythm: Regular rhythm. Tachycardia present.     Heart sounds: No murmur.  Pulmonary:     Effort: No respiratory distress.     Breath sounds: Normal breath sounds.  Abdominal:     Palpations: Abdomen is soft.     Tenderness: There is no abdominal tenderness.  Musculoskeletal:        General: No deformity or signs of injury.  Skin:    General: Skin is warm and dry.  Neurological:     Mental Status: He is unresponsive.     GCS: GCS eye subscore is 2. GCS verbal subscore is 1. GCS motor subscore is 5.  Psychiatric:        Behavior: Behavior is agitated.      ED Treatments / Results  Labs (all labs ordered are listed, but only abnormal results are displayed) Labs Reviewed  COMPREHENSIVE METABOLIC PANEL - Abnormal; Notable for the following components:      Result Value   Glucose, Bld 68 (*)    BUN 58 (*)    Creatinine, Ser 7.73 (*)    GFR calc non Af Amer 8 (*)    GFR calc Af Amer 10 (*)    All other components within normal limits  CBC WITH DIFFERENTIAL/PLATELET - Abnormal; Notable for the following components:   WBC 12.6 (*)    RBC 4.05 (*)    Hemoglobin 12.2 (*)    HCT 38.0 (*)    RDW 17.5 (*)    Neutro Abs 8.2 (*)    Eosinophils Absolute 0.6 (*)    All other components within normal limits  URINALYSIS, ROUTINE W REFLEX MICROSCOPIC - Abnormal; Notable for the following components:   Color, Urine STRAW (*)    Glucose, UA >=500 (*)    Hgb urine dipstick SMALL (*)    Ketones, ur 5 (*)    Protein, ur >=300 (*)    All other components within normal limits  CBC - Abnormal; Notable for the following components:   WBC 21.2 (*)    RBC 4.05 (*)    Hemoglobin 12.3 (*)    HCT 38.3 (*)    RDW  17.2 (*)    All other components within normal limits  BASIC METABOLIC PANEL - Abnormal; Notable for the following components:   Potassium 6.6 (*)    CO2 19 (*)    Glucose, Bld 470 (*)    BUN 58 (*)    Creatinine, Ser 7.75 (*)    GFR calc non Af Amer 8 (*)    GFR calc Af Amer 10 (*)    Anion gap 17 (*)    All other components within normal limits  PHOSPHORUS - Abnormal; Notable for the following components:   Phosphorus 6.5 (*)    All other components within normal limits  RAPID URINE DRUG SCREEN, HOSP PERFORMED - Abnormal; Notable for the following components:   Cocaine POSITIVE (*)    Benzodiazepines POSITIVE (*)    All other components within normal limits  HEMOGLOBIN A1C - Abnormal; Notable for the following components:   Hgb A1c MFr Bld 8.8 (*)    All other components within normal limits  GLUCOSE, CAPILLARY - Abnormal; Notable for the following components:   Glucose-Capillary 411 (*)    All other components within normal limits  GLUCOSE, CAPILLARY - Abnormal; Notable for the following components:   Glucose-Capillary 427 (*)    All other components within normal limits  GLUCOSE, CAPILLARY - Abnormal; Notable for the following components:   Glucose-Capillary 302 (*)    All other components within normal limits  GLUCOSE, CAPILLARY - Abnormal; Notable for the following components:   Glucose-Capillary 221 (*)    All other components within normal limits  GLUCOSE, CAPILLARY - Abnormal; Notable for the following components:   Glucose-Capillary 170 (*)    All other components within normal limits  GLUCOSE, CAPILLARY - Abnormal; Notable for the following components:   Glucose-Capillary 151 (*)    All other components within normal limits  CBG MONITORING, ED - Abnormal; Notable for the following components:   Glucose-Capillary 65 (*)    All other components within normal limits  CBG MONITORING, ED - Abnormal; Notable for the following components:   Glucose-Capillary 171 (*)      All other components within normal limits  POCT I-STAT 7, (LYTES, BLD GAS, ICA,H+H) - Abnormal; Notable  for the following components:   pO2, Arterial 248.0 (*)    HCT 35.0 (*)    Hemoglobin 11.9 (*)    All other components within normal limits  SARS CORONAVIRUS 2 (HOSPITAL ORDER, [facility])  ETHANOL  PROTIME-INR  MAGNESIUM  CK  CK  HEPATITIS PANEL, ACUTE  HIV ANTIBODY (ROUTINE TESTING W REFLEX)  PROCALCITONIN  I-STAT ARTERIAL BLOOD GAS, ED  TYPE AND SCREEN  PREPARE FRESH FROZEN PLASMA  ABO/RH    EKG EKG Interpretation  Date/Time:  Wednesday Jun 26 2018 22:02:47 EDT Ventricular Rate:  102 PR Interval:    QRS Duration: 95 QT Interval:  316 QTC Calculation: 412 R Axis:   64 Text Interpretation:  Sinus tachycardia Left atrial enlargement Left ventricular hypertrophy Borderline ST elevation, lateral leads No old tracing to compare Confirmed by Delora Fuel ([id number]) on 06/27/2018 12:36:35 AM Also confirmed by Delora Fuel ([id number]), editor Philomena Doheny [id number])  on 06/27/2018 7:15:26 AM   Radiology Ct Head Wo Contrast  Result Date: 06/26/2018 CLINICAL DATA:  33 year old male found down after seizure like activity. Head trauma. EXAM: CT HEAD WITHOUT CONTRAST CT CERVICAL SPINE WITHOUT CONTRAST TECHNIQUE: Multidetector CT imaging of the head and cervical spine was performed following the standard protocol without intravenous contrast. Multiplanar CT image reconstructions of the cervical spine were also generated. COMPARISON:  None. FINDINGS: CT HEAD FINDINGS Brain: Prominent lateral ventricle size with a degree of colpocephaly although the temporal horns remain fairly diminutive. Third and especially 4th ventricle size is within normal limits. No evidence of transependymal edema. No midline shift, mass effect, or evidence of intracranial mass lesion. Normal basilar cisterns. No acute intracranial hemorrhage identified. Gray-white matter differentiation is within  normal limits throughout the brain. No cortically based acute infarct identified. Vascular: No suspicious intracranial vascular hyperdensity. Skull: Intact. Sinuses/Orbits: Visualized paranasal sinuses are well pneumatized. The tympanic cavities are clear, but there is partial left mastoid opacification with chronic appearing mastoid coalescence and sclerosis. The right mastoids are clear. Other: Intubated on the scout view with fluid in the pharynx. Orbit and scalp soft tissues appear within normal limits. CT CERVICAL SPINE FINDINGS Alignment: Normal cervical lordosis. Cervicothoracic junction alignment is within normal limits. Bilateral posterior element alignment is within normal limits. Skull base and vertebrae: Visualized skull base is intact. No atlanto-occipital dissociation. No acute osseous abnormality identified. Soft tissues and spinal canal: No prevertebral fluid or swelling. No visible canal hematoma. Endotracheal tube courses appropriately into the trachea, tip not included. Enteric tube courses into the esophagus. Negative noncontrast neck soft tissues. Disc levels:  Minor endplate spurring. Upper chest: Visible upper thoracic levels appear grossly intact. Partially visible dual lumen right IJ approach catheter. There is confluent dependent opacity in the left lung apex on series 8, image 95. Negative right lung apex. IMPRESSION: 1. No acute traumatic injury identified in the head or cervical spine. 2. Ventricular prominence with Colpocephaly, but otherwise negative noncontrast CT appearance of the brain. 3. Dependent opacity in the left upper lobe. Favor atelectasis over aspiration or pneumonia. 4. Visible ET and enteric tubes placed as expected. 5. Chronic postinflammatory changes of the left mastoid air cells. Electronically Signed   By: Genevie Ann M.D.   On: 06/26/2018 22:50   Ct Cervical Spine Wo Contrast  Result Date: 06/26/2018 CLINICAL DATA:  33 year old male found down after seizure like  activity. Head trauma. EXAM: CT HEAD WITHOUT CONTRAST CT CERVICAL SPINE WITHOUT CONTRAST TECHNIQUE: Multidetector CT imaging of the head and cervical  spine was performed following the standard protocol without intravenous contrast. Multiplanar CT image reconstructions of the cervical spine were also generated. COMPARISON:  None. FINDINGS: CT HEAD FINDINGS Brain: Prominent lateral ventricle size with a degree of colpocephaly although the temporal horns remain fairly diminutive. Third and especially 4th ventricle size is within normal limits. No evidence of transependymal edema. No midline shift, mass effect, or evidence of intracranial mass lesion. Normal basilar cisterns. No acute intracranial hemorrhage identified. Gray-white matter differentiation is within normal limits throughout the brain. No cortically based acute infarct identified. Vascular: No suspicious intracranial vascular hyperdensity. Skull: Intact. Sinuses/Orbits: Visualized paranasal sinuses are well pneumatized. The tympanic cavities are clear, but there is partial left mastoid opacification with chronic appearing mastoid coalescence and sclerosis. The right mastoids are clear. Other: Intubated on the scout view with fluid in the pharynx. Orbit and scalp soft tissues appear within normal limits. CT CERVICAL SPINE FINDINGS Alignment: Normal cervical lordosis. Cervicothoracic junction alignment is within normal limits. Bilateral posterior element alignment is within normal limits. Skull base and vertebrae: Visualized skull base is intact. No atlanto-occipital dissociation. No acute osseous abnormality identified. Soft tissues and spinal canal: No prevertebral fluid or swelling. No visible canal hematoma. Endotracheal tube courses appropriately into the trachea, tip not included. Enteric tube courses into the esophagus. Negative noncontrast neck soft tissues. Disc levels:  Minor endplate spurring. Upper chest: Visible upper thoracic levels appear  grossly intact. Partially visible dual lumen right IJ approach catheter. There is confluent dependent opacity in the left lung apex on series 8, image 95. Negative right lung apex. IMPRESSION: 1. No acute traumatic injury identified in the head or cervical spine. 2. Ventricular prominence with Colpocephaly, but otherwise negative noncontrast CT appearance of the brain. 3. Dependent opacity in the left upper lobe. Favor atelectasis over aspiration or pneumonia. 4. Visible ET and enteric tubes placed as expected. 5. Chronic postinflammatory changes of the left mastoid air cells. Electronically Signed   By: Genevie Ann M.D.   On: 06/26/2018 22:50   Dg Chest Portable 1 View  Result Date: 06/26/2018 CLINICAL DATA:  33 year old male level 1 trauma, head injury. Intubated. EXAM: PORTABLE CHEST 1 VIEW COMPARISON:  None. FINDINGS: Portable AP supine view at 2202 hours. Endotracheal tube tip in good position at the level the clavicles. Enteric tube courses to the left abdomen, tip not included. Superimposed right chest tunneled type dual lumen dialysis catheter. The patient is rotated to the left. Low lung volumes. Normal cardiac size and mediastinal contours. Allowing for portable technique the lungs are clear. Paucity bowel gas in the upper abdomen. No acute osseous abnormality identified. IMPRESSION: 1. ET tube tip in good position. Enteric tube courses to the left abdomen, tip not included. Right chest dialysis type catheter also in place. 2. No acute cardiopulmonary abnormality. Electronically Signed   By: Genevie Ann M.D.   On: 06/26/2018 22:30    Procedures Procedure Name: Intubation Date/Time: 06/26/2018 10:00 PM Performed by: Doneta Public, MD Pre-anesthesia Checklist: Patient identified, Patient being monitored and Suction available Oxygen Delivery Method: Non-rebreather mask Preoxygenation: Pre-oxygenation with 100% oxygen Induction Type: Rapid sequence Number of attempts: 1 Airway Equipment and Method: Rigid  stylet and Video-laryngoscopy Placement Confirmation: ETT inserted through vocal cords under direct vision,  Positive ETCO2,  CO2 detector and Breath sounds checked- equal and bilateral Secured at: 24 cm Tube secured with: ETT holder      (including critical care time)  Medications Ordered in ED Medications  heparin injection 5,000 Units (  has no administration in time range)  dexmedetomidine (PRECEDEX) 200 MCG/50ML (4 mcg/mL) infusion (1 mcg/kg/hr  72.6 kg Intravenous New Bag/Given 06/27/18 0943)  hydrALAZINE (APRESOLINE) injection 10-20 mg (has no administration in time range)  labetalol (NORMODYNE) injection 20 mg (has no administration in time range)  dextrose 50 % solution 25 g (has no administration in time range)  clevidipine (CLEVIPREX) infusion 0.5 mg/mL (14 mg/hr Intravenous New Bag/Given 06/27/18 0726)  thiamine 500mg  in normal saline (40ml) IVPB ( Intravenous Stopped 8/89/16 9450)  folic acid injection 1 mg (1 mg Intravenous Given 06/27/18 0957)  LORazepam (ATIVAN) injection 4 mg (has no administration in time range)  metoCLOPramide (REGLAN) injection 10 mg (has no administration in time range)  insulin regular, human (MYXREDLIN) 100 units/ 100 mL infusion (0.9 Units/hr Intravenous Rate/Dose Change 06/27/18 1054)  sodium polystyrene (KAYEXALATE) 15 GM/60ML suspension 30 g (has no administration in time range)  LORazepam (ATIVAN) injection ( Intravenous Canceled Entry 06/26/18 2215)  dextrose 50 % solution ( Intravenous Canceled Entry 06/26/18 2215)  propofol (DIPRIVAN) 1000 MG/100ML infusion (  Stopped 06/26/18 2335)  etomidate (AMIDATE) injection (20 mg Intravenous Given 06/26/18 2213)  rocuronium (ZEMURON) injection (100 mg Intravenous Given 06/26/18 2213)  haloperidol lactate (HALDOL) injection 1 mg (1 mg Intravenous Given 06/27/18 0103)  haloperidol lactate (HALDOL) 5 MG/ML injection (  Return to [facility] 06/27/18 0209)  levETIRAcetam (KEPPRA) IVPB 1000 mg/100 mL premix (  Intravenous Stopped 06/27/18 0219)  haloperidol lactate (HALDOL) injection 1 mg (4 mg Intravenous Given 06/27/18 0156)  LORazepam (ATIVAN) 2 MG/ML injection (4 mg  Given 06/27/18 0155)  diphenhydrAMINE (BENADRYL) 50 MG/ML injection (50 mg  Given 06/27/18 0156)     Initial Impression / Assessment and Plan / ED Course  I have reviewed the triage vital signs and the nursing notes.  Pertinent labs & imaging results that were available during my care of the patient were reviewed by me and considered in my medical decision making (see chart for details).          Florentino ARSHDEEP BOLGER is a 33 y.o. male. With a pmhx of ESRD, HTN who presents to the ED as a level 1 trauma after a suspected fall and possible seizure/head trauma.   On arrival patient is a GCS 8, however he is super combative and agitated. EKG obtained showed no peaked T-waves. Normal QRS. Concern for head injury causing seizures. Patient given 2 ativan without a response. Decision made to intubate patient for airway protection and necessity to obtain scans.  CT scans without evidence of acute injury. CBC and CMP unremarkable. K normal. AG normal.  Differential seizure vs toxic encephalopathy.   ICU called for admission.  Final Clinical Impressions(s) / ED Diagnoses   Final diagnoses:  Acute respiratory failure, unspecified whether with hypoxia or hypercapnia [facility])  Encephalopathy    ED Discharge Orders    None       Doneta Public, MD 06/27/18 1138    Doneta Public, MD 06/27/18 1139    Sherwood Gambler, MD 06/28/18 [phone]

## 2018-06-27 NOTE — Progress Notes (Signed)
CRITICAL VALUE ALERT  Critical Value:  CSF Cell Count WBC 113  Date & Time Notied: 06/27/18 1520  Provider Notified:  Dr. Valeta Harms  Orders Received/Actions taken: No new orders given.  MD to notify Neuro per Dr. Valeta Harms

## 2018-06-27 NOTE — Significant Event (Signed)
Sever agitation and delirium Very aggressive and agitated despite being maxed out on Precedex. Given 4 mg of ativan, 50 mg of benadryl , 4 mg of haldol IV. Bolused 1.5 mcg's of precedex He is now sleeping comfortably. Suspect alcohol or substance abuse withdrawal despite the mom thinking she does not think he drinks or uses drugs except suboxone sometimes. But it makes sense with this severe agitation, seizure and elevated BP. Need to confirm history tomorrow with friends as mother does not appear to know much. He was also extremely hypertensive with SBP > 230, will place on cleviprex drip Cont precedex and titrate Urine toxicology when able as he does not make much urine Start thiamine and folic acid

## 2018-06-27 NOTE — Progress Notes (Signed)
EEG complete - results pending 

## 2018-06-27 NOTE — H&P (Signed)
Name: Brent Daniels MRN: [id number] DOB: 06/12/1985 Date of service : 06/27/2018 Chief complaint: AMS   LOS: 1  [facility] / Critical Care Note   History of Present Illness: 33 yo male with hx of ESRD on HD, HTN, DM, past hx of seizures (but no AED's),. Mother says he has had lots of seizures in the past and last was a few months ago, she does not know why he is not on antiepilectic drugs but he used to be on gabapentin in the past. Today he was at a friends house and collapsed , had tonic clonic seizure per the mom , EMS arrived had normal BS , hypertensive on arrival to the ED, post-ictal very confused and agitated , intubated for CT scan.   During my evaluation the sedation was held patient passed SBT and was extubated in the emergency room. He was hypertensive with systolics in the 324 plus range, awake but not following commands and intermittent agitation.  The history is provided by the mother over the phone. ROS is + for nausea and vomiting , which is normal for him , he has gastroparesis. No fevers, chills, night sweats. Rest of 12 point ROS done over the phone with mom and negative.    Lines / Drains: PIV   Cultures: pending   Antibiotics: none   Tests / Events: CT brain and C spine negative    Past Medical History:  Diagnosis Date  . Diabetes mellitus without complication ([place])   . Renal disorder     History reviewed. No pertinent surgical history.  Prior to Admission medications   Not on File    Allergies No Known Allergies  Family History History reviewed. No pertinent family history.  Social History  has no history on file for tobacco, alcohol, and drug.  He has hx of opioid abuse and has been on suboxone.  Review Of Systems:   Vital Signs: Temp:  [97 F (36.1 C)] 97 F (36.1 C) (05/20 2207) Pulse Rate:  [77-110] 77 (05/21 0012) Resp:  [16-24] 17 (05/21 0012) BP: (153-254)/(96-141) 220/118 (05/21 0012) SpO2:  [93 %-100 %] 93 % (05/21  0012) FiO2 (%):  [40 %-60 %] 40 % (05/20 2324) Weight:  [72.6 kg] 72.6 kg (05/20 2209) No intake/output data recorded.  Physical Examination: General: awake and agitated , does not follow commands HEENT: The scalp is atraumatic. PERRL. EOMI. Conjunctivae and sclerae are normal. There is no icterus. Pharynx clear. Tons of secretions. Neck: Supple. No submandibular/cervical LA. Trachea midline, No thyromegaly.  Lungs: Good air entry throughout the lung fields, normal respiratory effort. No W/R/R.  Cardiovascular: Normal S1/S2, no murmurs or rubs.  Abdomen: Soft, NT,ND, No masses, OM or guarding. Normoactive bowel sounds.  Extremities: Warm and well perfused. No cyanosis, clubbing or edema.  Skin: Intact with no rashes, excessive bruising, or petechiae.  MSK/neuro - moves all ext and does not follow commands, tracks   Ventilator settings: Vent Mode: PRVC FiO2 (%):  [40 %-60 %] 40 % Set Rate:  [16 bmp] 16 bmp Vt Set:  [620 mL] 620 mL PEEP:  [5 cmH20] 5 cmH20 Plateau Pressure:  [17 cmH20] 17 cmH20  Labs    CBC Recent Labs  Lab 06/26/18 2200 06/26/18 2320  HGB 12.2* 11.9*  HCT 38.0* 35.0*  WBC 12.6*  --   PLT 284  --      BMET Recent Labs  Lab 06/26/18 2200 06/26/18 2320  NA 140 139  K 4.7 4.5  CL  105  --   CO2 22  --   GLUCOSE 68*  --   BUN 58*  --   CREATININE 7.73*  --   CALCIUM 9.9  --     Recent Labs  Lab 06/26/18 2200  INR 1.1    Recent Labs  Lab 06/26/18 2320  PHART 7.383  PCO2ART 42.6  PO2ART 248.0*  HCO3 25.3  TCO2 27  O2SAT 100.0     Radiology: CXR no consolidations  CT BRAIN AND C SPINE - normal    Assessment and Plan: Principal Problem:   Encephalopathy acute Active Problems:   Seizures ([facility])   Hypertensive emergency   ESRD (end stage renal disease) on dialysis ([place])   Diabetes type 2, uncontrolled ([place])  Neuro - acute encephalopathy secondary to post-ictal state , hx of seizures on no meds , suspect this is secondary to  uncontrolled HTN eeg , neuro consult precedex for agitation Etoh level negative utox pending  [facility] - hypertensive emergency causing seizures , labetalol prn , might need a drip if we cannot keep below < 180 with prn's Restart oral meds once able to swallow  Lungs - extubated in the ED  Nephro- ESRD on HD , nephro c/s for HD  Endo - NISS , hx of DM2   Id - observe off abx  Best practices / Disposition: -->Code Status: full -->DVT Px:hsq -->GI Px:not indicated -->Diet:NPO  Critical care time spent: 45 minutes  06/27/2018, 12:23 AM

## 2018-06-27 NOTE — Progress Notes (Signed)
No seizures on EEG. Reviewed report.  -- Amie Portland, MD [facility] Pager: [phone] If 7pm to 7am, please call on call as listed on AMION.

## 2018-06-27 NOTE — Procedures (Addendum)
Indication: meningitis  Risks of the procedure were dicussed with the patient including post-LP headache, bleeding, infection, weakness/numbness of legs(radiculopathy), death.  The patient's proxy agreed and written consent was obtained.   The patient was prepped and draped, and using sterile technique a 20 gauge quinke spinal needle was inserted in the L4-L5 space.  Approximately 13 cc of CSF were obtained and sent for analysis. No complications were encountered and fluid was clear.  Brent Quill PA-C [facility] [phone]  M-F  (9:00 am- 5:00 PM)  06/27/2018, 2:51 PM   Attending addendum Lumbar puncture done under my direction but not direct personal supervision-I was available to assist as necessary in the same building.   -- Amie Portland, MD [facility] Pager: [phone] If 7pm to 7am, please call on call as listed on AMION.

## 2018-06-28 ENCOUNTER — Inpatient Hospital Stay (HOSPITAL_COMMUNITY): Payer: Medicare Other

## 2018-06-28 LAB — HIV ANTIBODY (ROUTINE TESTING W REFLEX): HIV Screen 4th Generation wRfx: NONREACTIVE

## 2018-06-28 LAB — GLUCOSE, CAPILLARY
Glucose-Capillary: 104 mg/dL — ABNORMAL HIGH (ref 70–99)
Glucose-Capillary: 139 mg/dL — ABNORMAL HIGH (ref 70–99)
Glucose-Capillary: 161 mg/dL — ABNORMAL HIGH (ref 70–99)
Glucose-Capillary: 178 mg/dL — ABNORMAL HIGH (ref 70–99)
Glucose-Capillary: 194 mg/dL — ABNORMAL HIGH (ref 70–99)
Glucose-Capillary: 195 mg/dL — ABNORMAL HIGH (ref 70–99)
Glucose-Capillary: 244 mg/dL — ABNORMAL HIGH (ref 70–99)

## 2018-06-28 LAB — HEPATITIS B SURFACE ANTIGEN: Hepatitis B Surface Ag: NEGATIVE

## 2018-06-28 LAB — PROCALCITONIN: Procalcitonin: 5.31 ng/mL

## 2018-06-28 LAB — LEGIONELLA PNEUMOPHILA SEROGP 1 UR AG: L. pneumophila Serogp 1 Ur Ag: NEGATIVE

## 2018-06-28 LAB — CBC
HCT: 40.4 % (ref 39.0–52.0)
Hemoglobin: 13.2 g/dL (ref 13.0–17.0)
MCH: 30 pg (ref 26.0–34.0)
MCHC: 32.7 g/dL (ref 30.0–36.0)
MCV: 91.8 fL (ref 80.0–100.0)
Platelets: 276 10*3/uL (ref 150–400)
RBC: 4.4 MIL/uL (ref 4.22–5.81)
RDW: 17 % — ABNORMAL HIGH (ref 11.5–15.5)
WBC: 15.5 10*3/uL — ABNORMAL HIGH (ref 4.0–10.5)
nRBC: 0 % (ref 0.0–0.2)

## 2018-06-28 LAB — BASIC METABOLIC PANEL
Anion gap: 18 — ABNORMAL HIGH (ref 5–15)
BUN: 33 mg/dL — ABNORMAL HIGH (ref 6–20)
CO2: 20 mmol/L — ABNORMAL LOW (ref 22–32)
Calcium: 9.8 mg/dL (ref 8.9–10.3)
Chloride: 98 mmol/L (ref 98–111)
Creatinine, Ser: 6.01 mg/dL — ABNORMAL HIGH (ref 0.61–1.24)
GFR calc Af Amer: 13 mL/min — ABNORMAL LOW (ref >60)
GFR calc non Af Amer: 11 mL/min — ABNORMAL LOW (ref >60)
Glucose, Bld: 143 mg/dL — ABNORMAL HIGH (ref 70–99)
Potassium: 5.2 mmol/L — ABNORMAL HIGH (ref 3.5–5.1)
Sodium: 136 mmol/L (ref 135–145)

## 2018-06-28 LAB — HEPATITIS PANEL, ACUTE
HCV Ab: <0.1 s/co ratio (ref 0.0–0.9)
Hep A IgM: NEGATIVE
Hep B C IgM: NEGATIVE
Hepatitis B Surface Ag: NEGATIVE

## 2018-06-28 LAB — PATHOLOGIST SMEAR REVIEW

## 2018-06-28 LAB — BLOOD PRODUCT ORDER (VERBAL) VERIFICATION

## 2018-06-28 LAB — RPR: RPR Ser Ql: NONREACTIVE

## 2018-06-28 LAB — ABO/RH: ABO/RH(D): A POS

## 2018-06-28 MED ORDER — HEPARIN SODIUM (PORCINE) 1000 UNIT/ML DIALYSIS
40.0000 [IU]/kg | INTRAMUSCULAR | Status: DC | PRN
  Administered 2018-06-28: 04:00:00 3100 [IU] via INTRAVENOUS_CENTRAL
  Filled 2018-06-28: qty 4

## 2018-06-28 MED ORDER — ONDANSETRON HCL 4 MG/2ML IJ SOLN
4.0000 mg | Freq: Four times a day (QID) | INTRAMUSCULAR | Status: DC | PRN
  Administered 2018-06-29 – 2018-07-01 (×6): 4 mg via INTRAVENOUS
  Filled 2018-06-28 (×8): qty 2

## 2018-06-28 MED ORDER — SODIUM CHLORIDE 0.9 % IV SOLN: 100.0000 mL | INTRAVENOUS | Status: DC | PRN

## 2018-06-28 MED ORDER — SODIUM CHLORIDE 0.9 % IV SOLN
100.0000 mL | INTRAVENOUS | Status: DC | PRN
  Administered 2018-06-29: 100 mL via INTRAVENOUS

## 2018-06-28 MED ORDER — ORAL CARE MOUTH RINSE: 15.0000 mL | Freq: Two times a day (BID) | OROMUCOSAL | Status: DC

## 2018-06-28 MED ORDER — CHLORHEXIDINE GLUCONATE CLOTH 2 % EX PADS
6.0000 | MEDICATED_PAD | Freq: Every day | CUTANEOUS | Status: DC
  Administered 2018-06-29 – 2018-07-02 (×4): 6 via TOPICAL

## 2018-06-28 MED ORDER — LACTATED RINGERS IV SOLN
INTRAVENOUS | Status: DC
  Administered 2018-06-28: 14:00:00 via INTRAVENOUS

## 2018-06-28 MED ORDER — ACETAMINOPHEN 500 MG PO TABS
500.0000 mg | ORAL_TABLET | Freq: Four times a day (QID) | ORAL | Status: DC | PRN
  Administered 2018-06-28 – 2018-06-29 (×2): 500 mg via ORAL
  Filled 2018-06-28: qty 1

## 2018-06-28 MED ORDER — CHLORHEXIDINE GLUCONATE 0.12 % MT SOLN
15.0000 mL | Freq: Two times a day (BID) | OROMUCOSAL | Status: DC
  Administered 2018-06-29 – 2018-06-30 (×2): 15 mL via OROMUCOSAL
  Filled 2018-06-28 (×8): qty 15

## 2018-06-28 MED ORDER — DEXTROSE 5 % IV SOLN
5.0000 mg/kg | INTRAVENOUS | Status: DC
  Administered 2018-06-28 – 2018-06-30 (×3): 375 mg via INTRAVENOUS
  Filled 2018-06-28 (×5): qty 7.5

## 2018-06-28 MED ORDER — LORAZEPAM 2 MG/ML IJ SOLN
1.0000 mg | INTRAMUSCULAR | Status: DC | PRN
  Administered 2018-06-28 – 2018-06-29 (×3): 2 mg via INTRAVENOUS
  Filled 2018-06-28 (×3): qty 1

## 2018-06-28 NOTE — Progress Notes (Signed)
Patient transferred to [id number] on tele.

## 2018-06-28 NOTE — Progress Notes (Signed)
Name: Brent Daniels MRN: [id number] DOB: 1985/04/07 Date of service : 06/27/2018 Chief complaint: AMS   LOS: 2  [facility] Note   History of Present Illness: 33 yo male with hx of ESRD on HD, HTN, DM, past hx of seizures (but no AED's),. Mother says he has had lots of seizures in the past and last was a few months ago, she does not know why he is not on antiepilectic drugs but he used to be on gabapentin in the past. Today he was at a friends house and collapsed , had tonic clonic seizure per the mom , EMS arrived had normal BS , hypertensive on arrival to the ED, post-ictal very confused and agitated , intubated for CT scan.   During my evaluation the sedation was held patient passed SBT and was extubated in the emergency room. He was hypertensive with systolics in the 262 plus range, awake but not following commands and intermittent agitation.  The history is provided by the mother over the phone. ROS is + for nausea and vomiting , which is normal for him , he has gastroparesis. No fevers, chills, night sweats. Rest of 12 point ROS done over the phone with mom and negative.   Lines / Drains: PIV Right HD cath (in prior to admit )   Cultures:  5/20 SARS >Neg  5/21 LP Cx>  5/21 BC x 2 >>    Antibiotics:   Rocephin 5/21 >>  Vanc 5/21 >>  Acyclovir 5/21 >>    Tests / Events:  5/20 CT brain >neg  5/20 C spine negative  Procedures :  5/21 LP per neuro  5/21 EEG abnormal electroencephalogram characterized by general background slowing with superimposed beta activity consistent with current medications.  No epileptiform activity is noted.    Consults:  5/21 Neuro  5/21 Renal    Subjective /Overnight :  Fever yesterday , LP done per Neuro , Cx pending , started on empiric abx/antivirals  for mennigitis  Increased alertness, following intermittent commands, and intermittent agitation  Complains of nausea  No noted seizure activity ,  EEG 5/20  no  seizure act  HD last PM      Vital Signs: Temp:  [98 F (36.7 C)-102.4 F (39.1 C)] 98 F (36.7 C) (05/22 0436) Pulse Rate:  [62-105] 88 (05/22 0830) Resp:  [13-36] 16 (05/22 0830) BP: (109-190)/(62-111) 169/99 (05/22 0830) SpO2:  [98 %-100 %] 98 % (05/22 0830) Weight:  [74.9 kg] 74.9 kg (05/22 0500) I/O last 3 completed shifts: In: 0355 [I.V.:758.7; IV Piggyback:697.3] Out: 4125 [Urine:2125; Other:2000]  Physical Examination: General: more awake but remains lethargic  HEENT: NCAT , MMM Neck: supple , no JVD  Lungs: CTA , no wheezing  Cardiovascular: RRR, 1-2/6 SM  Abdomen: soft , NT , BS +  Extremities: warm , dry , no edema  Skin: intact , no rash  MSK/neuro -increased alertness, lethargic , mae  x4   Ventilator settings:    Labs    CBC Recent Labs  Lab 06/26/18 2200 06/26/18 2320 06/27/18 0509 06/28/18 0744  HGB 12.2* 11.9* 12.3* 13.2  HCT 38.0* 35.0* 38.3* 40.4  WBC 12.6*  --  21.2* 15.5*  PLT 284  --  245 276     BMET Recent Labs  Lab 06/26/18 2200 06/26/18 2320 06/27/18 0509 06/27/18 1550 06/28/18 0744  NA 140 139 135 143 136  K 4.7 4.5 6.6* 5.1 5.2*  CL 105  --  99 106  98  CO2 22  --  19* 22 20*  GLUCOSE 68*  --  470* 80 143*  BUN 58*  --  58* 66* 33*  CREATININE 7.73*  --  7.75* 9.00* 6.01*  CALCIUM 9.9  --  9.3 9.6 9.8  MG  --   --  2.0  --   --   PHOS  --   --  6.5*  --   --     Recent Labs  Lab 06/26/18 2200  INR 1.1    Recent Labs  Lab 06/26/18 2320  PHART 7.383  PCO2ART 42.6  PO2ART 248.0*  HCO3 25.3  TCO2 27  O2SAT 100.0     Radiology: CXR 5/22 Clear  5/20 CT BRAIN AND C SPINE - neg acute        Assessment and Plan: Principal Problem:   Encephalopathy acute Active Problems:   Seizures (HCC)   Hypertensive emergency   ESRD (end stage renal disease) on dialysis ([place])   Diabetes type 2, uncontrolled ([facility])  Neuro - acute encephalopathy secondary to post-ictal state , Possible Infectious meningoencephalitis  s/p LP on 5/21  Seizure disorder - no formal workup in OP setting . Reported seizure activity per mother in past . Received Keppra 5/21  ETOH neg , Cocaine ++ EEG 5/21 no seizure act .  Off Precedex 5/22  Plan  Appreciate Neuro input  precedex for agitation Thiamine /folic acid  Ativan prn  Follow cx data  Continue empiric abx /antivirals  antieleptics on hold per neuro  Avoid oversedation , decrease ativan prn dose    Hypertensive emergency  , -improved  Weaned off Cleviprex 5/21   Plan  Hydrlazine and labetalol As needed    Respiratory Insufficiency : Extubated in ER 5/21  Plan  O2 to keep sats >90%.      ESRD on HD -?non compliance  Hyperkalemia -improving  HD 5/21  Plan  Renal following    DM2 -hyperglycemia  BS tr down   Plan  SSI    Id -  Fever/Leukocytosis/elevated PCT (2>5.3)   -concerning for mennigitis  LP on 5/21  Plan  Follow CX data  Continue on empiric ABX Marc Morgans     Best practices / Disposition: -->Code Status: full -->DVT Px:hsq -->GI Px:not indicated -->Diet: Advance as tolerated   Disposition: Transfer to progressive care , PCCM to sign off  To [facility] 5/23     Kyan Giannone NP-C  [facility]  [phone]  06/28/2018, 8:56 AM

## 2018-06-28 NOTE — Progress Notes (Signed)
   06/28/18 2100  Provider Notification  Provider Name/Title Blount NP  Date Provider Notified 06/28/18  Time Provider Notified 2100  Notification Type Page  Notification Reason Other (Comment) (protocol)  Response No new orders

## 2018-06-28 NOTE — Progress Notes (Signed)
Per RN, pt still combative, to be given dose of ativan @ 12a. And another @ 2a. Pt to be scanned near 2a.

## 2018-06-28 NOTE — Progress Notes (Signed)
Pt continues to be combative when he wakes up. Pt woke up shouting " I need to get Out of here" ripped off his gown, telemetry and go out of bed disoriented and puling off all rquiptment and urinated on the floor. It took 4 staff members to get pt back in bed safely. PRN ativan was given for agitation. Will continue to monitor.

## 2018-06-28 NOTE — Progress Notes (Signed)
[facility] ROUNDING NOTE   Subjective:   33 year old male with end-stage renal disease hemodialysis dependent hypertension diabetes  history of seizures.  He has a right hemodialysis catheter.  He was brought to the emergency room 06/27/2018 with a seizure and confused postictal agitated requiring intubation.  Blood pressure 172/97 pulse 95 temperature 98 O2 sats 98% room air     IV thiamine IV acyclovir 10 mg per kilogram every 24 hours IV vancomycin Rocephin 2 g every 12 hours  Sodium 136 potassium 5.2 chloride 98 CO2 20 BUN 33 creatinine 6 glucose 143 calcium 9.8 hemoglobin 13.2 WBC 15.5  Chest x-ray heart size mediastinal contours within normal range right internal dialysis catheter in place  CT scan head and cervical no acute traumatic injury noted ventricular problems on CT scan of brain.  CSF evaluation colorless 113 WBCs neutrophils.  Cryptococcal antigen negative.  HIV screen nonreactive MRSA screen negative drug screen positive for cocaine benzodiazepines.     Objective:  Vital signs in last 24 hours:  Temp:  [98 F (36.7 C)-102.4 F (39.1 C)] 98 F (36.7 C) (05/22 0436) Pulse Rate:  [62-105] 95 (05/22 0915) Resp:  [13-36] 17 (05/22 0915) BP: (109-190)/(62-111) 172/97 (05/22 0915) SpO2:  [98 %-100 %] 100 % (05/22 0915) Weight:  [74.9 kg] 74.9 kg (05/22 0500)  Weight change: 2.324 kg Filed Weights   06/27/18 0441 06/28/18 0056 06/28/18 0500  Weight: 76.6 kg 74.9 kg 74.9 kg    Intake/Output: I/O last 3 completed shifts: In: 7989 [I.V.:758.7; IV Piggyback:697.3] Out: 4125 [Urine:2125; Other:2000]   Intake/Output this shift:  Total I/O In: 104.9 [I.V.:24.4; IV Piggyback:80.5] Out: -    Difficult to arouse CVS-regular rate and rhythm no murmurs rubs gallops RS- CTA wheezes or rales IJ dialysis catheter ABD- BS present soft non-distended EXT- no edema   Basic Metabolic Panel: Recent Labs  Lab 06/26/18 2200 06/26/18 2320  06/27/18 0509 06/27/18 1550 06/28/18 0744  NA 140 139 135 143 136  K 4.7 4.5 6.6* 5.1 5.2*  CL 105  --  99 106 98  CO2 22  --  19* 22 20*  GLUCOSE 68*  --  470* 80 143*  BUN 58*  --  58* 66* 33*  CREATININE 7.73*  --  7.75* 9.00* 6.01*  CALCIUM 9.9  --  9.3 9.6 9.8  MG  --   --  2.0  --   --   PHOS  --   --  6.5*  --   --     Liver Function Tests: Recent Labs  Lab 06/26/18 2200  AST 23  ALT 19  ALKPHOS 83  BILITOT 0.7  PROT 7.5  ALBUMIN 4.3   No results for input(s): LIPASE, AMYLASE in the last 168 hours. No results for input(s): AMMONIA in the last 168 hours.  CBC: Recent Labs  Lab 06/26/18 2200 06/26/18 2320 06/27/18 0509 06/28/18 0744  WBC 12.6*  --  21.2* 15.5*  NEUTROABS 8.2*  --   --   --   HGB 12.2* 11.9* 12.3* 13.2  HCT 38.0* 35.0* 38.3* 40.4  MCV 93.8  --  94.6 91.8  PLT 284  --  245 276    Cardiac Enzymes: Recent Labs  Lab 06/26/18 2200 06/27/18 0509  CKTOTAL 201 221    BNP: Invalid input(s): POCBNP  CBG: Recent Labs  Lab 06/27/18 2004 06/27/18 2053 06/28/18 0010 06/28/18 0429 06/28/18 0748  GLUCAP 296* 349* 195* 104* 139*    Microbiology: Results for orders  placed or performed during the hospital encounter of 06/26/18  SARS Coronavirus 2 (CEPHEID- Performed in [facility] hospital lab), Hosp Order     Status: None   Collection Time: 06/26/18 10:50 PM  Result Value Ref Range Status   SARS Coronavirus 2 NEGATIVE NEGATIVE Final    Comment: (NOTE) If result is NEGATIVE SARS-CoV-2 target nucleic acids are NOT DETECTED. The SARS-CoV-2 RNA is generally detectable in upper and lower  respiratory specimens during the acute phase of infection. The lowest  concentration of SARS-CoV-2 viral copies this assay can detect is 250  copies / mL. A negative result does not preclude SARS-CoV-2 infection  and should not be used as the sole basis for treatment or other  patient management decisions.  A negative result may occur with  improper  specimen collection / handling, submission of specimen other  than nasopharyngeal swab, presence of viral mutation(s) within the  areas targeted by this assay, and inadequate number of viral copies  (<250 copies / mL). A negative result must be combined with clinical  observations, patient history, and epidemiological information. If result is POSITIVE SARS-CoV-2 target nucleic acids are DETECTED. The SARS-CoV-2 RNA is generally detectable in upper and lower  respiratory specimens dur ing the acute phase of infection.  Positive  results are indicative of active infection with SARS-CoV-2.  Clinical  correlation with patient history and other diagnostic information is  necessary to determine patient infection status.  Positive results do  not rule out bacterial infection or co-infection with other viruses. If result is PRESUMPTIVE POSTIVE SARS-CoV-2 nucleic acids MAY BE PRESENT.   A presumptive positive result was obtained on the submitted specimen  and confirmed on repeat testing.  While 2019 novel coronavirus  (SARS-CoV-2) nucleic acids may be present in the submitted sample  additional confirmatory testing may be necessary for epidemiological  and / or clinical management purposes  to differentiate between  SARS-CoV-2 and other Sarbecovirus currently known to infect humans.  If clinically indicated additional testing with an alternate test  methodology [phone]) is advised. The SARS-CoV-2 RNA is generally  detectable in upper and lower respiratory sp ecimens during the acute  phase of infection. The expected result is Negative. Fact Sheet for Patients:  StrictlyIdeas.no Fact Sheet for Healthcare Providers: BankingDealers.co.za This test is not yet approved or cleared by the [place] FDA and has been authorized for detection and/or diagnosis of SARS-CoV-2 by FDA under an Emergency Use Authorization (EUA).  This EUA will remain in  effect (meaning this test can be used) for the duration of the COVID-19 declaration under Section 564(b)(1) of the Act, 21 U.S.C. section 360bbb-3(b)(1), unless the authorization is terminated or revoked sooner. Performed at [facility], [place] [address], [place], [place] [address]   MRSA PCR Screening     Status: None   Collection Time: 06/27/18  1:06 PM  Result Value Ref Range Status   MRSA by PCR NEGATIVE NEGATIVE Final    Comment:        The GeneXpert MRSA Assay (FDA approved for NASAL specimens only), is one component of a comprehensive MRSA colonization surveillance program. It is not intended to diagnose MRSA infection nor to guide or monitor treatment for MRSA infections. Performed at [facility], [place] [address] [place], [place] [address]   CSF culture with Stat gram stain     Status: None (Preliminary result)   Collection Time: 06/27/18  2:49 PM  Result Value Ref Range Status  Specimen Description CSF  Final   Special Requests NONE  Final   Gram Stain   Final    WBC PRESENT, PREDOMINANTLY PMN NO ORGANISMS SEEN CYTOSPIN SMEAR    Culture   Final    NO GROWTH < 24 HOURS Performed at [facility] [address] [place], [place] [id number]    Report Status PENDING  Incomplete    Coagulation Studies: Recent Labs    06/26/18 2200  LABPROT 14.4  INR 1.1    Urinalysis: Recent Labs    06/27/18 0509  COLORURINE STRAW*  LABSPEC 1.010  PHURINE 7.0  GLUCOSEU >=500*  HGBUR SMALL*  BILIRUBINUR NEGATIVE  KETONESUR 5*  PROTEINUR >=300*  NITRITE NEGATIVE  LEUKOCYTESUR NEGATIVE      Imaging: Ct Head Wo Contrast  Result Date: 06/26/2018 CLINICAL DATA:  33 year old male found down after seizure like activity. Head trauma. EXAM: CT HEAD WITHOUT CONTRAST CT CERVICAL SPINE WITHOUT CONTRAST TECHNIQUE: Multidetector CT imaging of the head and cervical spine was performed following the standard protocol without intravenous contrast. Multiplanar CT  image reconstructions of the cervical spine were also generated. COMPARISON:  None. FINDINGS: CT HEAD FINDINGS Brain: Prominent lateral ventricle size with a degree of colpocephaly although the temporal horns remain fairly diminutive. Third and especially 4th ventricle size is within normal limits. No evidence of transependymal edema. No midline shift, mass effect, or evidence of intracranial mass lesion. Normal basilar cisterns. No acute intracranial hemorrhage identified. Gray-white matter differentiation is within normal limits throughout the brain. No cortically based acute infarct identified. Vascular: No suspicious intracranial vascular hyperdensity. Skull: Intact. Sinuses/Orbits: Visualized paranasal sinuses are well pneumatized. The tympanic cavities are clear, but there is partial left mastoid opacification with chronic appearing mastoid coalescence and sclerosis. The right mastoids are clear. Other: Intubated on the scout view with fluid in the pharynx. Orbit and scalp soft tissues appear within normal limits. CT CERVICAL SPINE FINDINGS Alignment: Normal cervical lordosis. Cervicothoracic junction alignment is within normal limits. Bilateral posterior element alignment is within normal limits. Skull base and vertebrae: Visualized skull base is intact. No atlanto-occipital dissociation. No acute osseous abnormality identified. Soft tissues and spinal canal: No prevertebral fluid or swelling. No visible canal hematoma. Endotracheal tube courses appropriately into the trachea, tip not included. Enteric tube courses into the esophagus. Negative noncontrast neck soft tissues. Disc levels:  Minor endplate spurring. Upper chest: Visible upper thoracic levels appear grossly intact. Partially visible dual lumen right IJ approach catheter. There is confluent dependent opacity in the left lung apex on series 8, image 95. Negative right lung apex. IMPRESSION: 1. No acute traumatic injury identified in the head or  cervical spine. 2. Ventricular prominence with Colpocephaly, but otherwise negative noncontrast CT appearance of the brain. 3. Dependent opacity in the left upper lobe. Favor atelectasis over aspiration or pneumonia. 4. Visible ET and enteric tubes placed as expected. 5. Chronic postinflammatory changes of the left mastoid air cells. Electronically Signed   By: Genevie Ann M.D.   On: 06/26/2018 22:50   Ct Cervical Spine Wo Contrast  Result Date: 06/26/2018 CLINICAL DATA:  33 year old male found down after seizure like activity. Head trauma. EXAM: CT HEAD WITHOUT CONTRAST CT CERVICAL SPINE WITHOUT CONTRAST TECHNIQUE: Multidetector CT imaging of the head and cervical spine was performed following the standard protocol without intravenous contrast. Multiplanar CT image reconstructions of the cervical spine were also generated. COMPARISON:  None. FINDINGS: CT HEAD FINDINGS Brain: Prominent lateral ventricle size with a degree of colpocephaly  although the temporal horns remain fairly diminutive. Third and especially 4th ventricle size is within normal limits. No evidence of transependymal edema. No midline shift, mass effect, or evidence of intracranial mass lesion. Normal basilar cisterns. No acute intracranial hemorrhage identified. Gray-white matter differentiation is within normal limits throughout the brain. No cortically based acute infarct identified. Vascular: No suspicious intracranial vascular hyperdensity. Skull: Intact. Sinuses/Orbits: Visualized paranasal sinuses are well pneumatized. The tympanic cavities are clear, but there is partial left mastoid opacification with chronic appearing mastoid coalescence and sclerosis. The right mastoids are clear. Other: Intubated on the scout view with fluid in the pharynx. Orbit and scalp soft tissues appear within normal limits. CT CERVICAL SPINE FINDINGS Alignment: Normal cervical lordosis. Cervicothoracic junction alignment is within normal limits. Bilateral  posterior element alignment is within normal limits. Skull base and vertebrae: Visualized skull base is intact. No atlanto-occipital dissociation. No acute osseous abnormality identified. Soft tissues and spinal canal: No prevertebral fluid or swelling. No visible canal hematoma. Endotracheal tube courses appropriately into the trachea, tip not included. Enteric tube courses into the esophagus. Negative noncontrast neck soft tissues. Disc levels:  Minor endplate spurring. Upper chest: Visible upper thoracic levels appear grossly intact. Partially visible dual lumen right IJ approach catheter. There is confluent dependent opacity in the left lung apex on series 8, image 95. Negative right lung apex. IMPRESSION: 1. No acute traumatic injury identified in the head or cervical spine. 2. Ventricular prominence with Colpocephaly, but otherwise negative noncontrast CT appearance of the brain. 3. Dependent opacity in the left upper lobe. Favor atelectasis over aspiration or pneumonia. 4. Visible ET and enteric tubes placed as expected. 5. Chronic postinflammatory changes of the left mastoid air cells. Electronically Signed   By: Genevie Ann M.D.   On: 06/26/2018 22:50   Dg Chest Port 1 View  Result Date: 06/28/2018 CLINICAL DATA:  Respiratory failure. EXAM: PORTABLE CHEST 1 VIEW COMPARISON:  Radiograph Jun 27, 2018. FINDINGS: The heart size and mediastinal contours are within normal limits. Right internal jugular dialysis catheter is unchanged in position. No pneumothorax or pleural effusion is noted. Lungs are clear. The visualized skeletal structures are unremarkable. IMPRESSION: No active disease. Electronically Signed   By: Marijo Conception M.D.   On: 06/28/2018 08:27   Dg Chest Port 1 View  Result Date: 06/27/2018 CLINICAL DATA:  Fever and hypertension EXAM: PORTABLE CHEST 1 VIEW COMPARISON:  06/26/2018 FINDINGS: Endotracheal tube and nasogastric catheter have been removed. Dialysis catheter is again seen in  satisfactory position. Lungs are well aerated bilaterally. Mild right basilar atelectasis is seen. No acute bony abnormality is noted. Cardiac shadow is within normal limits. IMPRESSION: Mild right basilar atelectasis. Electronically Signed   By: Inez Catalina M.D.   On: 06/27/2018 12:35   Dg Chest Portable 1 View  Result Date: 06/26/2018 CLINICAL DATA:  33 year old male level 1 trauma, head injury. Intubated. EXAM: PORTABLE CHEST 1 VIEW COMPARISON:  None. FINDINGS: Portable AP supine view at 2202 hours. Endotracheal tube tip in good position at the level the clavicles. Enteric tube courses to the left abdomen, tip not included. Superimposed right chest tunneled type dual lumen dialysis catheter. The patient is rotated to the left. Low lung volumes. Normal cardiac size and mediastinal contours. Allowing for portable technique the lungs are clear. Paucity bowel gas in the upper abdomen. No acute osseous abnormality identified. IMPRESSION: 1. ET tube tip in good position. Enteric tube courses to the left abdomen, tip not included. Right chest dialysis  type catheter also in place. 2. No acute cardiopulmonary abnormality. Electronically Signed   By: Genevie Ann M.D.   On: 06/26/2018 22:30     Medications:   . sodium chloride    . sodium chloride    . acyclovir Stopped (06/28/18 9381)  . cefTRIAXone (ROCEPHIN)  IV 2 g (06/28/18 1003)  . thiamine injection Stopped (06/28/18 0649)   . chlorhexidine  15 mL Mouth Rinse BID  . Chlorhexidine Gluconate Cloth  6 each Topical Q0600  . folic acid  1 mg Intravenous Daily  . heparin  5,000 Units Subcutaneous Q8H  . insulin aspart  3-9 Units Subcutaneous Q4H  . insulin detemir  5 Units Subcutaneous Q12H  . mouth rinse  15 mL Mouth Rinse q12n4p  . sodium polystyrene  30 g Rectal Once  . vancomycin variable dose per unstable renal function (pharmacist dosing)   Does not apply See admin instructions   sodium chloride, sodium chloride, dextrose, heparin, hydrALAZINE,  labetalol, LORazepam, metoCLOPramide (REGLAN) injection  Assessment/ Plan:   End-stage renal disease [facility] dialysis.   [place] underwent dialysis 06/27/2018 with 2 L removed.  Right IJ dialysis catheter  Anemia stable not issue at this time  Bones.  Follow calcium phosphorus and evaluate for secondary hyperparathyroidism  Diabetes mellitus per primary team   Acute encephalitis possible infectious meningeal encephalitis status post LP 06/27/2018 EtOH negative cocaine positive.  Empiric antibiotics and antivirals prescribed.  Will defer dosing to infectious disease/clinical pharmacy.  Dosing may need to be adjusted for end-stage renal disease.  Hypertensive emergency has been weaned off Cleviprex we will continue to follow no post ictal.  Hypertension/volume appears to be stable from renal standpoint euvolemia     LOS: 2 Sherril Croon @TODAY @10 :12 AM

## 2018-06-28 NOTE — Progress Notes (Signed)
Renal Navigator notified OP HD clinic/[facility] in [place] of patient's admission and negative COVID 19 rapid test result to provide continuity of care.  Per Clinic Administrative Assistant, patient is very non-compliant with OP HD. He has been to treatment 3 times so far in the month of May. He only stayed for 90 minutes on his last treatment (06/20/18). Renal Navigator will update OP HD clinic when patient has been discharged and will provide them with Discharge Summary and most recent inpt HD treatment sheet to further provide continuity of care and smooth transition from hospital back to OP HD clinic.  Alphonzo Cruise, [facility] Renal Navigator [phone]

## 2018-06-28 NOTE — Progress Notes (Signed)
Pharmacy Antibiotic Note  Brent Daniels is a 33 y.o. male admitted on 06/26/2018 with HTN emergency and seizures. Pt developed fevers concerning for possible CNS infection. Antimicrobials broadened to vancomycin, ceftriaxone, and acyclovir. Pt noted to have ESRD on HD (schedule unclear). Last iHD last night, planning for HD again tomorrow.  Plan: -Adjust acyclovir to 5mg /kg IV q24h -Continue ceftriaxone 2g IV q12h -Continue vancomycin 750mg  IV qHD - next session planned 5/23 -Follow HD schedule closely - no standing vancomycin orders -Monitor cultures   Height: 6' (182.9 cm) Weight: 165 lb 2 oz (74.9 kg) IBW/kg (Calculated) : 77.6  Temp (24hrs), Avg:99.8 F (37.7 C), Min:98 F (36.7 C), Max:102.4 F (39.1 C)  Recent Labs  Lab 06/26/18 2200 06/27/18 0509 06/27/18 1550 06/28/18 0744  WBC 12.6* 21.2*  --  15.5*  CREATININE 7.73* 7.75* 9.00* 6.01*  LATICACIDVEN  --   --  0.8  --     Estimated Creatinine Clearance: 18.3 mL/min (A) (by C-G formula based on SCr of 6.01 mg/dL (H)).    No Known Allergies  Antimicrobials this admission: 5/21 Unasyn >> 5/21 5/21 Ceftriaxone >> 5/21 Vancomycin >> 5/21 Acyclovir >>   Microbiology results: 5/21 BCx: sent 5/21 MRSA PCR: neg 5/21 CSF: NGTD 5/21 Fungal Stain: sent   Arrie Senate, PharmD, BCPS Clinical Pharmacist [phone] Please check AMION for all [facility] numbers 06/28/2018

## 2018-06-28 NOTE — Progress Notes (Signed)
Brief Note:  Called by RN, patient with tachycardia and nausea Temp check 101.3.  Complains of some abdominal tenderness   Exam  Tachycardia HR 130  +BS , + periumbilical tenderness  Alert and following commands.   Plan  Check EKG  zofran As needed   Tylenol As needed  Fever.  Check abd film x 1 view.   Tammy Parrett NP-C  [facility]  [phone]  06/28/2018

## 2018-06-28 NOTE — Progress Notes (Signed)
Neurology Progress Note   S:// No acute changes overnight. There have been intermittent periods of agitation followed by some lucid times.  O:// Current vital signs: BP (!) 172/97   Pulse 95   Temp 98 F (36.7 C) (Oral)   Resp 17   Ht 6' (1.829 m)   Wt 74.9 kg   SpO2 100%   BMI 22.39 kg/m  Vital signs in last 24 hours: Temp:  [98 F (36.7 C)-102.4 F (39.1 C)] 98 F (36.7 C) (05/22 0436) Pulse Rate:  [62-105] 95 (05/22 0915) Resp:  [13-36] 17 (05/22 0915) BP: (109-190)/(62-111) 172/97 (05/22 0915) SpO2:  [98 %-100 %] 100 % (05/22 0915) Weight:  [74.9 kg] 74.9 kg (05/22 0500) General: Drowsy, no distress HEENT: Normocephalic atraumatic.  Mouth extremely crusted with secretions. CVS: S1-2 heard regular rhythm Respiratory: Clear to auscultation Abdomen: Nondistended nontender Extremities: No edema Neurological exam Drowsy, opens eyes to voice and follows commands. Speech is mildly dysarthric. Poor attention concentration No evidence of aphasia per se-but exam limited by the poor attention concentration Cranial nerves: Pupils equal round react light, extraocular movements intact, visual fields full, face appears symmetric, auditory acuity intact, tongue midline Motor exam: Generally weak, but was able to momentarily left all 4 extremities antigravity. Sensory exam: Intact light touch Could not assess coordination and gait due to current mentation  Medications  Current Facility-Administered Medications:  .  0.9 %  sodium chloride infusion, 100 mL, Intravenous, PRN, Elmarie Shiley, MD .  0.9 %  sodium chloride infusion, 100 mL, Intravenous, PRN, Elmarie Shiley, MD .  acyclovir (ZOVIRAX) 765 mg in dextrose 5 % 150 mL IVPB, 10 mg/kg, Intravenous, Q24H, Icard, Bradley L, DO, Stopped at 06/28/18 [phone] .  cefTRIAXone (ROCEPHIN) 2 g in sodium chloride 0.9 % 100 mL IVPB, 2 g, Intravenous, Q12H, Parrett, Tammy S, NP, Last Rate: 200 mL/hr at 06/28/18 1003, 2 g at 06/28/18 1003 .   chlorhexidine (PERIDEX) 0.12 % solution 15 mL, 15 mL, Mouth Rinse, BID, Aurea Graff R, MD .  Chlorhexidine Gluconate Cloth 2 % PADS 6 each, 6 each, Topical, Q0600, Elmarie Shiley, MD .  dextrose 50 % solution 25 g, 25 g, Intravenous, Q1H PRN, Aurea Graff R, MD .  folic acid injection 1 mg, 1 mg, Intravenous, Daily, Aurea Graff R, MD, 1 mg at 06/27/18 0957 .  heparin injection 3,100 Units, 40 Units/kg, Dialysis, PRN, Elmarie Shiley, MD, 3,100 Units at 06/28/18 0332 .  heparin injection 5,000 Units, 5,000 Units, Subcutaneous, Q8H, Corey Harold, NP, 5,000 Units at 06/28/18 9563 .  hydrALAZINE (APRESOLINE) injection 10-20 mg, 10-20 mg, Intravenous, Q4H PRN, Corey Harold, NP, 20 mg at 06/27/18 2305 .  insulin aspart (novoLOG) injection 3-9 Units, 3-9 Units, Subcutaneous, Q4H, Elsie Lincoln, MD, 3 Units at 06/28/18 [phone] .  insulin detemir (LEVEMIR) injection 5 Units, 5 Units, Subcutaneous, Q12H, Icard, Bradley L, DO, 5 Units at 06/28/18 1004 .  labetalol (NORMODYNE) injection 20 mg, 20 mg, Intravenous, Q10 min PRN, Roxanne Mins, MD, 20 mg at 06/28/18 4332 .  LORazepam (ATIVAN) injection 1-2 mg, 1-2 mg, Intravenous, Q2H PRN, Parrett, Tammy S, NP .  MEDLINE mouth rinse, 15 mL, Mouth Rinse, q12n4p, Rosario, Luis R, MD .  metoCLOPramide (REGLAN) injection 10 mg, 10 mg, Intravenous, Q6H PRN, Aurea Graff R, MD .  sodium polystyrene (KAYEXALATE) 15 GM/60ML suspension 30 g, 30 g, Rectal, Once, Anders Simmonds, MD .  thiamine 500mg  in normal saline (87ml) IVPB, 500 mg, Intravenous, Q8H, [place], [place]  R, MD, Stopped at 06/28/18 [phone] .  vancomycin variable dose per unstable renal function (pharmacist dosing), , Does not apply, See admin instructions, Kris Mouton, [facility] Labs CBC    Component Value Date/Time   WBC 15.5 (H) 06/28/2018 0744   RBC 4.40 06/28/2018 0744   HGB 13.2 06/28/2018 0744   HCT 40.4 06/28/2018 0744   PLT 276 06/28/2018 0744   MCV 91.8 06/28/2018 0744   MCH 30.0 06/28/2018 0744    MCHC 32.7 06/28/2018 0744   RDW 17.0 (H) 06/28/2018 0744   LYMPHSABS 2.8 06/26/2018 2200   MONOABS 0.9 06/26/2018 2200   EOSABS 0.6 (H) 06/26/2018 2200   BASOSABS 0.1 06/26/2018 2200    CMP     Component Value Date/Time   NA 136 06/28/2018 0744   K 5.2 (H) 06/28/2018 0744   CL 98 06/28/2018 0744   CO2 20 (L) 06/28/2018 0744   GLUCOSE 143 (H) 06/28/2018 0744   BUN 33 (H) 06/28/2018 0744   CREATININE 6.01 (H) 06/28/2018 0744   CALCIUM 9.8 06/28/2018 0744   PROT 7.5 06/26/2018 2200   ALBUMIN 4.3 06/26/2018 2200   AST 23 06/26/2018 2200   ALT 19 06/26/2018 2200   ALKPHOS 83 06/26/2018 2200   BILITOT 0.7 06/26/2018 2200   GFRNONAA 11 (L) 06/28/2018 0744   GFRAA 13 (L) 06/28/2018 0744   Urinary toxicology positive for cocaine Serum HIV and RPR test negative Hepatitis panel negative   CSF results Glucose 133, protein 34, 113 white blood cells with 95% segmented neutrophils and 1% lymphocytes.  Negative cryptococcal antigen. CSF culture negative thus far Fungus culture CSF pending Cryptococcal antigen negative in CSF.  EEG with general background slowing and superimposed beta consistent with medication effect.  No obvious seizures  Imaging I have reviewed images in epic and the results pertinent to this consultation are: CT head with no acute changes  Assessment: 33 year old past history of diabetes, ESRD on dialysis, opiate dependence and substance use brought in for concern for seizures when he suddenly collapsed in front of her friend and was very agitated upon having been brought to the hospital. Abnormalities on his labs on presentation included hyperglycemia, severe hypertension and deranged renal function. Briefly intubated to get a CT to rule out an ICH, which was negative for ICH. Neurological consultation for seizures Also spiked a fever and had leukocytosis with no obvious explanation, hence a spinal tap was done to look for any evidence of meningoencephalitis  as a cause of presentation.  I suspect that his current presentation is less likely a CNS infection and more likely a combination of vasospasm events by cocaine and also hypertensive emergency/posterior reversible encephalopathy syndrome.  Of note, history obtained from mother regarding the seizure history prior to presentation was more consistent with side effects from gabapentin in the setting of renal dysfunction than true seizures.  Impression: Toxic metabolic encephalopathy Illicit drug use Evaluate for meningoencephalitis Evaluate for posterior reversible encephalopathy syndrome Evaluate for seizures   Recommendations: I am less suspicious for seizures and more suspicious for either meningoencephalitis or hypertensive emergency/posterior reversible encephalopathy syndrome causing his current presentation and CSF picture. He is improving with some antibiotics as well as blood pressure control. I would recommend continuing the antibiotics and antivirals for now until all the tests come negative including the HSV PCR. I would also recommend continuing maintaining his blood pressure in the normotensive range. Seizure precautions should be maintained although suspicion for seizure is low Management of toxic metabolic derangements per  primary team as you are including management of his sugars and end-stage renal disease. Continue frequent neurochecks  I expect him to improve as his toxic metabolic derangements are improved, his blood pressures stabilize and normalize and his renal function is addressed as well as he might have metabolized any active drugs that he might of done at that point since his urinary toxicology screen was positive for cocaine.  I discussed my plan in detail with the primary team MD and NP on the unit.  -- Amie Portland, MD [facility] Pager: [phone] If 7pm to 7am, please call on call as listed on AMION.  CRITICAL CARE ATTESTATION Performed by:  Amie Portland, MD Total critical care time: 30 minutes Critical care time was exclusive of separately billable procedures and treating other patients and/or supervising APPs/Residents/Students Critical care was necessary to treat or prevent imminent or life-threatening deterioration due to toxic metabolic encephalopathy, drug overuse, concern for posterior reversible encephalopathy syndrome and concern for seizure. This patient is critically ill and at significant risk for neurological worsening and/or death and care requires constant monitoring. Critical care was time spent personally by me on the following activities: development of treatment plan with patient and/or surrogate as well as nursing, discussions with consultants, evaluation of patient's response to treatment, examination of patient, obtaining history from patient or surrogate, ordering and performing treatments and interventions, ordering and review of laboratory studies, ordering and review of radiographic studies, pulse oximetry, re-evaluation of patient's condition, participation in multidisciplinary rounds and medical decision making of high complexity in the care of this patient.

## 2018-06-29 ENCOUNTER — Inpatient Hospital Stay (HOSPITAL_COMMUNITY): Payer: Medicare Other

## 2018-06-29 DIAGNOSIS — R05 Cough: Secondary | ICD-10-CM

## 2018-06-29 DIAGNOSIS — F1120 Opioid dependence, uncomplicated: Secondary | ICD-10-CM

## 2018-06-29 DIAGNOSIS — E1165 Type 2 diabetes mellitus with hyperglycemia: Secondary | ICD-10-CM

## 2018-06-29 DIAGNOSIS — R569 Unspecified convulsions: Secondary | ICD-10-CM

## 2018-06-29 DIAGNOSIS — R110 Nausea: Secondary | ICD-10-CM

## 2018-06-29 DIAGNOSIS — R509 Fever, unspecified: Secondary | ICD-10-CM

## 2018-06-29 DIAGNOSIS — K0889 Other specified disorders of teeth and supporting structures: Secondary | ICD-10-CM

## 2018-06-29 LAB — CBC
HCT: 41 % (ref 39.0–52.0)
Hemoglobin: 13.6 g/dL (ref 13.0–17.0)
MCH: 30.2 pg (ref 26.0–34.0)
MCHC: 33.2 g/dL (ref 30.0–36.0)
MCV: 91.1 fL (ref 80.0–100.0)
Platelets: 269 10*3/uL (ref 150–400)
RBC: 4.5 MIL/uL (ref 4.22–5.81)
RDW: 16.7 % — ABNORMAL HIGH (ref 11.5–15.5)
WBC: 12.8 10*3/uL — ABNORMAL HIGH (ref 4.0–10.5)
nRBC: 0 % (ref 0.0–0.2)

## 2018-06-29 LAB — GLUCOSE, CAPILLARY
Glucose-Capillary: 243 mg/dL — ABNORMAL HIGH (ref 70–99)
Glucose-Capillary: 265 mg/dL — ABNORMAL HIGH (ref 70–99)
Glucose-Capillary: 317 mg/dL — ABNORMAL HIGH (ref 70–99)
Glucose-Capillary: 321 mg/dL — ABNORMAL HIGH (ref 70–99)
Glucose-Capillary: 347 mg/dL — ABNORMAL HIGH (ref 70–99)
Glucose-Capillary: 427 mg/dL — ABNORMAL HIGH (ref 70–99)

## 2018-06-29 LAB — BASIC METABOLIC PANEL
Anion gap: 20 — ABNORMAL HIGH (ref 5–15)
BUN: 47 mg/dL — ABNORMAL HIGH (ref 6–20)
CO2: 19 mmol/L — ABNORMAL LOW (ref 22–32)
Calcium: 9.8 mg/dL (ref 8.9–10.3)
Chloride: 96 mmol/L — ABNORMAL LOW (ref 98–111)
Creatinine, Ser: 7.97 mg/dL — ABNORMAL HIGH (ref 0.61–1.24)
GFR calc Af Amer: 9 mL/min — ABNORMAL LOW (ref >60)
GFR calc non Af Amer: 8 mL/min — ABNORMAL LOW (ref >60)
Glucose, Bld: 245 mg/dL — ABNORMAL HIGH (ref 70–99)
Potassium: 4.9 mmol/L (ref 3.5–5.1)
Sodium: 135 mmol/L (ref 135–145)

## 2018-06-29 LAB — PROCALCITONIN: Procalcitonin: 4.45 ng/mL

## 2018-06-29 MED ORDER — SODIUM CHLORIDE 0.9 % IV SOLN: 100.0000 mL | INTRAVENOUS | Status: DC | PRN

## 2018-06-29 MED ORDER — PROMETHAZINE HCL 25 MG/ML IJ SOLN
12.5000 mg | Freq: Once | INTRAMUSCULAR | Status: AC
  Administered 2018-06-29: 21:00:00 12.5 mg via INTRAVENOUS
  Filled 2018-06-29: qty 1

## 2018-06-29 MED ORDER — INSULIN ASPART 100 UNIT/ML ~~LOC~~ SOLN
0.0000 [IU] | Freq: Three times a day (TID) | SUBCUTANEOUS | Status: DC
  Administered 2018-06-29: 13:00:00 9 [IU] via SUBCUTANEOUS
  Administered 2018-06-29: 17:00:00 5 [IU] via SUBCUTANEOUS
  Administered 2018-06-30: 12:00:00 3 [IU] via SUBCUTANEOUS
  Administered 2018-06-30: 08:00:00 9 [IU] via SUBCUTANEOUS
  Administered 2018-06-30: 17:00:00 1 [IU] via SUBCUTANEOUS
  Administered 2018-07-01 (×2): 3 [IU] via SUBCUTANEOUS
  Administered 2018-07-01: 07:00:00 2 [IU] via SUBCUTANEOUS

## 2018-06-29 MED ORDER — LABETALOL HCL 200 MG PO TABS
200.0000 mg | ORAL_TABLET | Freq: Two times a day (BID) | ORAL | Status: DC
  Administered 2018-06-29 – 2018-06-30 (×4): 200 mg via ORAL
  Filled 2018-06-29 (×6): qty 1

## 2018-06-29 MED ORDER — INSULIN ASPART 100 UNIT/ML ~~LOC~~ SOLN
0.0000 [IU] | Freq: Every day | SUBCUTANEOUS | Status: DC
  Administered 2018-06-29: 22:00:00 4 [IU] via SUBCUTANEOUS

## 2018-06-29 MED ORDER — HEPARIN SODIUM (PORCINE) 1000 UNIT/ML DIALYSIS
1000.0000 [IU] | INTRAMUSCULAR | Status: DC | PRN
  Administered 2018-06-29: 11:00:00 3200 [IU] via INTRAVENOUS_CENTRAL
  Filled 2018-06-29: qty 1

## 2018-06-29 MED ORDER — ALUM & MAG HYDROXIDE-SIMETH 200-200-20 MG/5ML PO SUSP
30.0000 mL | Freq: Once | ORAL | Status: AC
  Administered 2018-06-29: 14:00:00 30 mL via ORAL
  Filled 2018-06-29: qty 30

## 2018-06-29 MED ORDER — HALOPERIDOL LACTATE 5 MG/ML IJ SOLN
1.0000 mg | Freq: Two times a day (BID) | INTRAMUSCULAR | Status: DC | PRN
  Administered 2018-06-29: 1 mg via INTRAVENOUS
  Filled 2018-06-29: qty 1
  Filled 2018-06-29: qty 0.2

## 2018-06-29 MED ORDER — LIDOCAINE-PRILOCAINE 2.5-2.5 % EX CREA: 1.0000 "application " | TOPICAL_CREAM | CUTANEOUS | Status: DC | PRN

## 2018-06-29 MED ORDER — HEPARIN SODIUM (PORCINE) 1000 UNIT/ML IJ SOLN
INTRAMUSCULAR | Status: AC
  Administered 2018-06-29: 3200 [IU] via INTRAVENOUS_CENTRAL
  Filled 2018-06-29: qty 4

## 2018-06-29 MED ORDER — LIDOCAINE HCL (PF) 1 % IJ SOLN: 5.0000 mL | INTRAMUSCULAR | Status: DC | PRN

## 2018-06-29 MED ORDER — PENTAFLUOROPROP-TETRAFLUOROETH EX AERO: 1.0000 "application " | INHALATION_SPRAY | CUTANEOUS | Status: DC | PRN

## 2018-06-29 MED ORDER — ACETAMINOPHEN 500 MG PO TABS
ORAL_TABLET | ORAL | Status: AC
  Filled 2018-06-29: qty 1

## 2018-06-29 MED ORDER — ACETAMINOPHEN 500 MG PO TABS
500.0000 mg | ORAL_TABLET | Freq: Four times a day (QID) | ORAL | Status: DC | PRN
  Administered 2018-07-03: 11:00:00 500 mg via ORAL
  Filled 2018-06-29 (×5): qty 1

## 2018-06-29 MED ORDER — ALTEPLASE 2 MG IJ SOLR: 2.0000 mg | Freq: Once | INTRAMUSCULAR | Status: DC | PRN

## 2018-06-29 NOTE — Progress Notes (Signed)
Patient is in dialysis

## 2018-06-29 NOTE — Progress Notes (Addendum)
PROGRESS NOTE   Brent Daniels  YSA:[id number]    DOB: [birth year]    DOA: 06/26/2018  PCP: Glenda Chroman, MD   I have briefly reviewed patients previous medical records in [facility].  Brief Narrative:  CCM transfer to [facility] 28/69: 33 year old male with PMH of ESRD on HD, noncompliant, HTN, DM 2, seizures but not on AEDs,Opiate dependence, substance abuse, on day of admission suddenly collapsed in front of his friend and had witnessed tonic-clonic seizure activity.  EMS noted normal BS, hypertensive (SBP's in the 200s) on arrival to ED, postictal, very confused and agitated, intubated in ED for CT of head and then successfully extubated shortly thereafter.  He was admitted to ICU by CCM.  Initial concern was for seizures.  Subsequently spiked a high fever with leukocytosis without clear source, underwent spinal tap and started on broad-spectrum antibiotics/acyclovir for possible meningoencephalitis.  Neurology suspects TME likely due to combination of cocaine induced vasospasm, hypertensive emergency/PRES and less likely due to CNS infection.  Nephrology on board for HD needs.  ID consulted 5/23.   Assessment & Plan:   Principal Problem:   Encephalopathy acute Active Problems:   Seizures ([place])   Hypertensive emergency   ESRD (end stage renal disease) on dialysis ([place])   Diabetes type 2, uncontrolled ([place])   Toxic metabolic encephalopathy  Head and C-spine 5/20 without acute findings.  MRI brain 5/23: Limited exam due to lack of cooperation.  Reported findings suggestive of small vessel acute/early subacute infarction in the right anterior temporal white matter.  However I discussed this finding with Dr. Rory Percy who indicates that this is not a stroke or a significant finding.  EEG 5/21: Abnormal, generalized background slowing with superimposed beta activity.  No epileptiform activity noted.  UDS 5/21: Positive for benzodiazepines and cocaine.  BAL <10.  CSF 5/21: WBC 113, segmented  neutrophils 95.  Glucose 133 and CSF protein 34.  CSF culture: Negative to date.  Blood cultures x2: Negative to date.  Urine pneumococcal and Legionella antigen negative.  Acute hepatitis panel negative, HIV screen negative x2, RPR negative and cryptococcal antigen negative.  SARS coronavirus 2: Negative.  Initial concern on admission was for seizures and postictal state.  He had been loaded with IV Keppra.  However after extensive work-up and as discussed with Dr. Rory Percy today, no CVA, less likely to be seizures, concern for meningoencephalitis (but as discussed with Dr. Tommy Medal this does not look like bacterial infection and recommend stopping antibiotics but continue IV acyclovir pending HSV results), hypertensive emergency/PRES and uremia.  No maintenance AEDs.  Discontinued antibiotics as per ID recommendations.  Continue IV acyclovir pending HSV results.  Better control of antihypertension and DM.  Continue HD per nephrology.  Delirium precautions.  Minimize sedatives/opioids.  Continue folate and IV thiamine.  Slowly improving.  I discussed with Dr. Rory Percy, Neurology who recommends considering Seroquel or IV Haldol for severe agitation.  Fever/possible meningoencephalitis  Extensive evaluation as noted above.  All antibiotics discontinued 5/23 as per ID recommendations.  Continue IV acyclovir pending HSV results.  Chest x-ray negative.  Had initial fever of 102.4 on 5/21 and then again on a 1.3 on 5/22.  Monitor closely.  Formal ID input pending.  Hypertensive emergency  Weaned off Cleviprex 5/21.  Labetalol 200 mg twice daily added 5/23.  Volume management across HD but does not appear volume overloaded.  Also on PRN IV hydralazine and labetalol  Monitor closely and adjust antihypertensives as needed.  ESRD  on HD  Management per nephrology.  Reportedly noncompliant as outpatient and only been to HD 3 times in the month of May.  Seen at HD on 5/23.  He  undergoes outpatient dialysis at [facility] in [place].  Dialyzed through right IJ HD catheter.  Hyperkalemia  Resolved after HD.  Type II DM with renal complications  R1V 8.8 suggests poor outpatient control.  Currently on Levemir 5 units twice daily and SSI.  Mildly uncontrolled and fluctuating.  Monitor closely and adjust insulins as needed.  Substance abuse/cocaine  Counseling when able.  DVT prophylaxis: Subcutaneous heparin Code Status: Full Family Communication: None at bedside Disposition: DC home pending clinical improvement   Consultants:  PCCM-signed off Nephrology ID  Procedures:  Intubated/extubated in ED. LP 5/21 HD  Antimicrobials:  IV acyclovir 5/21 > IV ceftriaxone 5/21-5/22, discontinued 5/23 IV vancomycin x1 on 5/21, discontinued 5/23   Subjective: Patient seen at HD this morning.  Safety sitter at bedside.  Patient is somnolent but easily arousable, oriented only to self and mumbles incomprehensibly.  Follows simple instructions.  Currently not agitated.  As per sitter, was asking for food earlier today.  As per discussion with nephrology, mental status definitely better compared to yesterday.  Overnight events noted, received 2 doses of Ativan for MRI brain.  ROS: Unable due to mental status change  Objective:  Vitals:   06/29/18 0830 06/29/18 0900 06/29/18 0930 06/29/18 1000  BP: (!) 157/112 (!) 146/101 (!) 138/97 122/71  Pulse: (!) 117 (!) 110 (!) 111 (!) 109  Resp:  20    Temp:      TempSrc:      SpO2:      Weight:      Height:        Examination:  General exam: Young male, moderately built and nourished, lying comfortably supine in bed undergoing HD via right IJ catheter. Respiratory system: Clear to auscultation. Respiratory effort normal. Cardiovascular system: S1 & S2 heard, RRR. No JVD, murmurs, rubs, gallops or clicks. No pedal edema.  Telemetry: Mild sinus tachycardia in the 100s. Gastrointestinal system:  Abdomen is nondistended, soft and nontender. No organomegaly or masses felt. Normal bowel sounds heard. Central nervous system: Mental status as discussed above. No focal neurological deficits. Extremities: Symmetric 5 x 5 power. Skin: No rashes, lesions or ulcers Psychiatry: Judgement and insight impaired. Mood & affect cannot be assessed at this time.     Data Reviewed: I have personally reviewed following labs and imaging studies  CBC: Recent Labs  Lab 06/26/18 2200 06/26/18 2320 06/27/18 0509 06/28/18 0744 06/29/18 0418  WBC 12.6*  --  21.2* 15.5* 12.8*  NEUTROABS 8.2*  --   --   --   --   HGB 12.2* 11.9* 12.3* 13.2 13.6  HCT 38.0* 35.0* 38.3* 40.4 41.0  MCV 93.8  --  94.6 91.8 91.1  PLT 284  --  245 276 400   Basic Metabolic Panel: Recent Labs  Lab 06/26/18 2200 06/26/18 2320 06/27/18 0509 06/27/18 1550 06/28/18 0744 06/29/18 0418  NA 140 139 135 143 136 135  K 4.7 4.5 6.6* 5.1 5.2* 4.9  CL 105  --  99 106 98 96*  CO2 22  --  19* 22 20* 19*  GLUCOSE 68*  --  470* 80 143* 245*  BUN 58*  --  58* 66* 33* 47*  CREATININE 7.73*  --  7.75* 9.00* 6.01* 7.97*  CALCIUM 9.9  --  9.3 9.6 9.8 9.8  MG  --   --  2.0  --   --   --   PHOS  --   --  6.5*  --   --   --    Liver Function Tests: Recent Labs  Lab 06/26/18 2200  AST 23  ALT 19  ALKPHOS 83  BILITOT 0.7  PROT 7.5  ALBUMIN 4.3   Coagulation Profile: Recent Labs  Lab 06/26/18 2200  INR 1.1   Cardiac Enzymes: Recent Labs  Lab 06/26/18 2200 06/27/18 0509  CKTOTAL 201 221   HbA1C: Recent Labs    06/27/18 0509  HGBA1C 8.8*   CBG: Recent Labs  Lab 06/28/18 1131 06/28/18 1822 06/28/18 2001 06/28/18 2344 06/29/18 0400  GLUCAP 194* 244* 161* 178* 243*    Recent Results (from the past 240 hour(s))  SARS Coronavirus 2 (CEPHEID- Performed in [facility] hospital lab), Hosp Order     Status: None   Collection Time: 06/26/18 10:50 PM  Result Value Ref Range Status   SARS Coronavirus 2 NEGATIVE  NEGATIVE Final    Comment: (NOTE) If result is NEGATIVE SARS-CoV-2 target nucleic acids are NOT DETECTED. The SARS-CoV-2 RNA is generally detectable in upper and lower  respiratory specimens during the acute phase of infection. The lowest  concentration of SARS-CoV-2 viral copies this assay can detect is 250  copies / mL. A negative result does not preclude SARS-CoV-2 infection  and should not be used as the sole basis for treatment or other  patient management decisions.  A negative result may occur with  improper specimen collection / handling, submission of specimen other  than nasopharyngeal swab, presence of viral mutation(s) within the  areas targeted by this assay, and inadequate number of viral copies  (<250 copies / mL). A negative result must be combined with clinical  observations, patient history, and epidemiological information. If result is POSITIVE SARS-CoV-2 target nucleic acids are DETECTED. The SARS-CoV-2 RNA is generally detectable in upper and lower  respiratory specimens dur ing the acute phase of infection.  Positive  results are indicative of active infection with SARS-CoV-2.  Clinical  correlation with patient history and other diagnostic information is  necessary to determine patient infection status.  Positive results do  not rule out bacterial infection or co-infection with other viruses. If result is PRESUMPTIVE POSTIVE SARS-CoV-2 nucleic acids MAY BE PRESENT.   A presumptive positive result was obtained on the submitted specimen  and confirmed on repeat testing.  While 2019 novel coronavirus  (SARS-CoV-2) nucleic acids may be present in the submitted sample  additional confirmatory testing may be necessary for epidemiological  and / or clinical management purposes  to differentiate between  SARS-CoV-2 and other Sarbecovirus currently known to infect humans.  If clinically indicated additional testing with an alternate test  methodology [id number]) is  advised. The SARS-CoV-2 RNA is generally  detectable in upper and lower respiratory sp ecimens during the acute  phase of infection. The expected result is Negative. Fact Sheet for Patients:  StrictlyIdeas.no Fact Sheet for Healthcare Providers: BankingDealers.co.za This test is not yet approved or cleared by the [place] FDA and has been authorized for detection and/or diagnosis of SARS-CoV-2 by FDA under an Emergency Use Authorization (EUA).  This EUA will remain in effect (meaning this test can be used) for the duration of the COVID-19 declaration under Section 564(b)(1) of the Act, 21 U.S.C. section 360bbb-3(b)(1), unless the authorization is terminated or revoked sooner. Performed at [facility], [place] [address] [place], [place] [address]  Culture, blood (Routine X 2) w Reflex to ID Panel     Status: None (Preliminary result)   Collection Time: 06/27/18 12:52 PM  Result Value Ref Range Status   Specimen Description BLOOD LEFT ANTECUBITAL  Final   Special Requests   Final    BOTTLES DRAWN AEROBIC ONLY Blood Culture adequate volume   Culture   Final    NO GROWTH < 24 HOURS Performed at [facility], [address] [place], [place] [address]    Report Status PENDING  Incomplete  Culture, blood (Routine X 2) w Reflex to ID Panel     Status: None (Preliminary result)   Collection Time: 06/27/18  1:00 PM  Result Value Ref Range Status   Specimen Description BLOOD RIGHT HAND  Final   Special Requests   Final    BOTTLES DRAWN AEROBIC ONLY Blood Culture adequate volume   Culture   Final    NO GROWTH < 24 HOURS Performed at [facility], [place] [address] [place], [place] [address]    Report Status PENDING  Incomplete  MRSA PCR Screening     Status: None   Collection Time: 06/27/18  1:06 PM  Result Value Ref Range Status   MRSA by PCR NEGATIVE NEGATIVE Final    Comment:        The GeneXpert MRSA Assay  (FDA approved for NASAL specimens only), is one component of a comprehensive MRSA colonization surveillance program. It is not intended to diagnose MRSA infection nor to guide or monitor treatment for MRSA infections. Performed at [facility], [place] [address] [place], [place] [address]   CSF culture with Stat gram stain     Status: None (Preliminary result)   Collection Time: 06/27/18  2:49 PM  Result Value Ref Range Status   Specimen Description CSF  Final   Special Requests NONE  Final   Gram Stain   Final    WBC PRESENT, PREDOMINANTLY PMN NO ORGANISMS SEEN CYTOSPIN SMEAR    Culture   Final    NO GROWTH < 24 HOURS Performed at [facility], [place] [address] [place], [place] [address]    Report Status PENDING  Incomplete         Radiology Studies: Mr Brain 83 Contrast  Result Date: 06/29/2018 CLINICAL DATA:  33 y/o M; history of seizure. Episode of loss of consciousness. EXAM: MRI HEAD WITHOUT CONTRAST TECHNIQUE: Axial DWI, coronal DWI, and sagittal T1 weighted sequences were acquired. Patient was unable to tolerate additional sequences. COMPARISON:  06/26/2018 CT of the head. FINDINGS: On the diffusion weighted sequence there is a punctate focus of reduced diffusion within the right anterior temporal lobe white matter (series 5, image 63). No additional diffusion signal abnormality is evident. Stable ventriculomegaly. Motion degraded sagittal T1 weighted sequence. No gross mass effect is evident. IMPRESSION: Limited examination. Punctate focus of reduced diffusion in the right anterior temporal white matter likely representing a small vessel acute/early subacute infarction. Pattern is atypical for seizure related activity. These results were called by telephone at the time of interpretation on 06/29/2018 at 3:59 am to Dr. Ignatius Specking , who verbally acknowledged these results. Electronically Signed   By: Kristine Garbe M.D.   On: 06/29/2018 04:02   Dg Chest  Port 1 View  Result Date: 06/28/2018 CLINICAL DATA:  Respiratory failure. EXAM: PORTABLE CHEST 1 VIEW COMPARISON:  Radiograph Jun 27, 2018. FINDINGS: The heart size and mediastinal contours are within normal limits.  Right internal jugular dialysis catheter is unchanged in position. No pneumothorax or pleural effusion is noted. Lungs are clear. The visualized skeletal structures are unremarkable. IMPRESSION: No active disease. Electronically Signed   By: Marijo Conception M.D.   On: 06/28/2018 08:27   Dg Chest Port 1 View  Result Date: 06/27/2018 CLINICAL DATA:  Fever and hypertension EXAM: PORTABLE CHEST 1 VIEW COMPARISON:  06/26/2018 FINDINGS: Endotracheal tube and nasogastric catheter have been removed. Dialysis catheter is again seen in satisfactory position. Lungs are well aerated bilaterally. Mild right basilar atelectasis is seen. No acute bony abnormality is noted. Cardiac shadow is within normal limits. IMPRESSION: Mild right basilar atelectasis. Electronically Signed   By: Inez Catalina M.D.   On: 06/27/2018 12:35   Dg Abd Portable 1v  Result Date: 06/28/2018 CLINICAL DATA:  Abdominal pain EXAM: PORTABLE ABDOMEN - 1 VIEW COMPARISON:  None. FINDINGS: There is no appreciable bowel dilatation or air-fluid level to suggest bowel obstruction. No free air. There are surgical clips in the right abdomen. IMPRESSION: No bowel obstruction or free air evident. Electronically Signed   By: Lowella Grip III M.D.   On: 06/28/2018 16:44        Scheduled Meds:  chlorhexidine  15 mL Mouth Rinse BID   Chlorhexidine Gluconate Cloth  6 each Topical Q0600   Chlorhexidine Gluconate Cloth  6 each Topical T7017   folic acid  1 mg Intravenous Daily   heparin       heparin  5,000 Units Subcutaneous Q8H   insulin aspart  3-9 Units Subcutaneous Q4H   insulin detemir  5 Units Subcutaneous Q12H   labetalol  200 mg Oral BID   mouth rinse  15 mL Mouth Rinse q12n4p   sodium polystyrene  30 g Rectal  Once   vancomycin variable dose per unstable renal function (pharmacist dosing)   Does not apply See admin instructions   Continuous Infusions:  sodium chloride     sodium chloride Stopped (06/29/18 0000)   sodium chloride     sodium chloride     acyclovir 375 mg (06/28/18 2230)   cefTRIAXone (ROCEPHIN)  IV 2 g (06/28/18 2337)   lactated ringers 25 mL/hr at 06/28/18 1339   thiamine injection 500 mg (06/29/18 0550)     LOS: 3 days     Vernell Leep, MD, FACP, [facility]. [facility]  To contact the attending provider between 7A-7P or the covering provider during after hours 7P-7A, please log into the web site [email or web address] and access using universal [place] password for that web site. If you do not have the password, please call the hospital operator.  06/29/2018, 10:24 AM

## 2018-06-29 NOTE — Progress Notes (Addendum)
Neurology Progress Note   S:// Patient seen and examined this morning. He did spike a fever yesterday.  No clear source. On antibiotic and antiviral coverage up until this morning for meningoencephalitis.  Seen by ID and antibacterial coverage for meningitis discontinued.  HSV pending PCR Transferred out of the ICU to 3 W. He is aware that he is admitted here for seizures.  He said that he had seizures when he was on gabapentin and gabapentin brought his seizures on. Evasive on questions about drugs history.   O:// Current vital signs: BP (!) 161/107 (BP Location: Left Arm)   Pulse (!) 121   Temp 97.9 F (36.6 C) (Oral)   Resp 19   Ht 6' (1.829 m)   Wt 73 kg   SpO2 100%   BMI 21.83 kg/m  Vital signs in last 24 hours: Temp:  [97.9 F (36.6 C)-99.3 F (37.4 C)] 97.9 F (36.6 C) (05/23 1135) Pulse Rate:  [79-122] 121 (05/23 1231) Resp:  [12-22] 19 (05/23 1231) BP: (114-175)/(71-116) 161/107 (05/23 1231) SpO2:  [95 %-100 %] 100 % (05/23 1231) Weight:  [73 kg-79.7 kg] 73 kg (05/23 1135) General: Awake alert in no distress HEENT: Normocephalic atraumatic, dry mucous membranes Cardiovascular: Regular rate rhythm Respiratory: Breathing normally and saturating well on room air Abdomen: Nondistended nontender Extremities: Trace edema Neurological exam He is awake alert oriented x2 Speech is mildly dysarthric although he is somewhat of a poor dentition so not sure if that is contributing to this. He has poor attention concentration No gross aphasia. Cranial nerves: Mild anisocoria with left pupil 3 mm and right pupil 4 mm, both are round and reactive, visual fields are full, face is grossly symmetric, auditory acuity intact, tongue and palate midline. Motor exam: He is antigravity in both upper extremities and antigravity in both lower extremities but due to his poor attention concentration he lets his extremities fall down to the bed shortly after getting them antigravity. Sensory  exam: Intact to light touch everywhere    Medications  Current Facility-Administered Medications:  .  0.9 %  sodium chloride infusion, 100 mL, Intravenous, PRN, Parrett, Tammy S, NP .  0.9 %  sodium chloride infusion, 100 mL, Intravenous, PRN, Parrett, Tammy S, NP, Last Rate: 999 mL/hr at 06/29/18 1250, 100 mL at 06/29/18 1250 .  acetaminophen (TYLENOL) 500 MG tablet, , , , , Stopped at 06/29/18 1114 .  acetaminophen (TYLENOL) tablet 500 mg, 500 mg, Oral, Q6H PRN, Hongalgi, Anand D, MD .  acyclovir (ZOVIRAX) 375 mg in dextrose 5 % 100 mL IVPB, 5 mg/kg, Intravenous, Q24H, Einar Grad, RPH, Last Rate: 107.5 mL/hr at 06/28/18 2230, 375 mg at 06/28/18 2230 .  alum & mag hydroxide-simeth (MAALOX/MYLANTA) 200-200-20 MG/5ML suspension 30 mL, 30 mL, Oral, Once, Hongalgi, Anand D, MD .  chlorhexidine (PERIDEX) 0.12 % solution 15 mL, 15 mL, Mouth Rinse, BID, Parrett, Tammy S, NP .  Chlorhexidine Gluconate Cloth 2 % PADS 6 each, 6 each, Topical, Q0600, Parrett, Tammy S, NP, 6 each at 06/29/18 0459 .  Chlorhexidine Gluconate Cloth 2 % PADS 6 each, 6 each, Topical, Q0600, Edrick Oh, MD, 6 each at 06/29/18 0459 .  dextrose 50 % solution 25 g, 25 g, Intravenous, Q1H PRN, Parrett, Tammy S, NP .  folic acid injection 1 mg, 1 mg, Intravenous, Daily, Parrett, Tammy S, NP, 1 mg at 06/28/18 1131 .  haloperidol lactate (HALDOL) injection 1 mg, 1 mg, Intravenous, Q12H PRN, Hongalgi, Lenis Dickinson, MD .  heparin  injection 3,100 Units, 40 Units/kg, Dialysis, PRN, Parrett, Tammy S, NP, 3,100 Units at 06/28/18 0332 .  heparin injection 5,000 Units, 5,000 Units, Subcutaneous, Q8H, Parrett, Tammy S, NP, 5,000 Units at 06/29/18 1317 .  hydrALAZINE (APRESOLINE) injection 10-20 mg, 10-20 mg, Intravenous, Q4H PRN, Parrett, Tammy S, NP, 10 mg at 06/29/18 0424 .  insulin aspart (novoLOG) injection 0-5 Units, 0-5 Units, Subcutaneous, QHS, Hongalgi, Anand D, MD .  insulin aspart (novoLOG) injection 0-9 Units, 0-9 Units,  Subcutaneous, TID WC, Hongalgi, Lenis Dickinson, MD, 9 Units at 06/29/18 1316 .  insulin detemir (LEVEMIR) injection 5 Units, 5 Units, Subcutaneous, Q12H, Parrett, Tammy S, NP, 5 Units at 06/29/18 1315 .  labetalol (NORMODYNE) injection 20 mg, 20 mg, Intravenous, Q10 min PRN, Parrett, Tammy S, NP, 20 mg at 06/28/18 6195 .  labetalol (NORMODYNE) tablet 200 mg, 200 mg, Oral, BID, Edrick Oh, MD, 200 mg at 06/29/18 1330 .  MEDLINE mouth rinse, 15 mL, Mouth Rinse, q12n4p, Parrett, Tammy S, NP .  ondansetron (ZOFRAN) injection 4 mg, 4 mg, Intravenous, Q6H PRN, Parrett, Tammy S, NP .  thiamine 500mg  in normal saline (29ml) IVPB, 500 mg, Intravenous, Q8H, Parrett, Tammy S, NP, Last Rate: 100 mL/hr at 06/29/18 1341, 500 mg at 06/29/18 1341 Labs CBC    Component Value Date/Time   WBC 12.8 (H) 06/29/2018 0418   RBC 4.50 06/29/2018 0418   HGB 13.6 06/29/2018 0418   HCT 41.0 06/29/2018 0418   PLT 269 06/29/2018 0418   MCV 91.1 06/29/2018 0418   MCH 30.2 06/29/2018 0418   MCHC 33.2 06/29/2018 0418   RDW 16.7 (H) 06/29/2018 0418   LYMPHSABS 2.8 06/26/2018 2200   MONOABS 0.9 06/26/2018 2200   EOSABS 0.6 (H) 06/26/2018 2200   BASOSABS 0.1 06/26/2018 2200    CMP     Component Value Date/Time   NA 135 06/29/2018 0418   K 4.9 06/29/2018 0418   CL 96 (L) 06/29/2018 0418   CO2 19 (L) 06/29/2018 0418   GLUCOSE 245 (H) 06/29/2018 0418   BUN 47 (H) 06/29/2018 0418   CREATININE 7.97 (H) 06/29/2018 0418   CALCIUM 9.8 06/29/2018 0418   PROT 7.5 06/26/2018 2200   ALBUMIN 4.3 06/26/2018 2200   AST 23 06/26/2018 2200   ALT 19 06/26/2018 2200   ALKPHOS 83 06/26/2018 2200   BILITOT 0.7 06/26/2018 2200   GFRNONAA 8 (L) 06/29/2018 0418   GFRAA 9 (L) 06/29/2018 0418   CSF results Glucose 133, protein 34, 113 white blood cells with 95% segmented neutrophils and 1% lymphocytes.  Negative cryptococcal antigen. CSF culture negative thus far Fungus culture CSF pending Cryptococcal antigen negative in  CSF.  Imaging I have reviewed images in epic and the results pertinent to this consultation are: MRI of the brain without contrast negative for evidence of posterior reversible encephalopathy or changes associated with accelerated hypertension.  There is a small area of restricted diffusion in the temporal white matter but I think that is an incidental finding and has no bearing on his current clinical presentation.   Assessment: 33 year old, past medical history of diabetes, ESRD on hemodialysis with noncompliance to dialysis, opiate dependence and possible substance abuse brought in for concern for possible seizures and collapse at different times. Urinary toxicology screen was positive for cocaine.  He was extremely hypertensive on arrival.  He also had hyperglycemia. Neurological consultation for seizure, altered mental status. Clarification and seizure history seems to be more suggestive of gabapentin induced myoclonus rather than true seizures.  He was very agitated on arrival.  Could be related to recent drug use.  He has been very evasive about questions of drug use and have not been able to provide clear history. He spiked a fever, had leukocytosis and a spinal tap was done which was puzzling- due to the results mentioned above.  He spiked fever yesterday again after having been treated with antibacterial and antiviral coverage for meningoencephalitis.  He has been seen by infectious diseases doctors today.  He is awaiting reconciliation of records from [facility] and [facility] and also Panorex, TTE and and possible pan CT scan.  I agree with the primary team and ID assessment that this looks less likely to be HSV or bacterial meningitis and the abnormal CSF picture with leukocytosis might be a result of either uncontrolled hypertension and breach of the blood-brain barrier which can be associated with severe hypertension and probably not due to any infectious cause but given his substance abuse  history, further infectious work-up/and or malignancy work-up is absolutely mandated.  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 I would be more concerned about an infectious process, of a more systemic nature for which ID has already started work-up. I would defer antibiotic use to ID. I would not keep him on any antiepileptics or start any antiepileptics at this time because the "seizures" are likely myoclonus from gabapentin in the setting of deranged renal function. Follow-up on the 2D echoes, blood cultures, Panorex and possible and CT scan as recommended by ID. Blood pressure goal normotensive.  I discussed my plan this morning with Dr. Algis Liming over the phone.   -- Amie Portland, MD [facility] Pager: [phone] If 7pm to 7am, please call on call as listed on AMION.

## 2018-06-29 NOTE — Progress Notes (Signed)
[facility] ROUNDING NOTE   Subjective:   33 year old male with end-stage renal disease hemodialysis dependent hypertension diabetes  history of seizures.  He has a right hemodialysis catheter.  He was brought to the emergency room 06/27/2018 with a seizure and confused postictal agitated requiring intubation.  He is now extubated and is receiving his dialysis treatment 06/29/2018  Blood pressure 170/100 pulse 100 O2 sats 99% room air temperature 64.3  IV folic acid 1 mg IV thiamine IV acyclovir 10 mg per kilogram every 24 hours Rocephin 2 g every 12 hours  Sodium 135 potassium 4.9 chloride 96 CO2 19 BUN 47 creatinine 7.97 glucose 245 calcium 9.8 WBC 12.8 hemoglobin 13.6 platelets 269  Chest x-ray heart size mediastinal contours within normal range right internal dialysis catheter in place  CT scan head and cervical no acute traumatic injury noted ventricular problems on CT scan of brain.  CSF evaluation colorless 113 WBCs neutrophils.  Cryptococcal antigen negative.  HIV screen nonreactive MRSA screen negative drug screen positive for cocaine benzodiazepines.    Objective:  Vital signs in last 24 hours:  Temp:  [98 F (36.7 C)-101.3 F (38.5 C)] 98 F (36.7 C) (05/23 0725) Pulse Rate:  [88-115] 99 (05/23 0740) Resp:  [12-22] 17 (05/23 0740) BP: (141-186)/(71-116) 172/107 (05/23 0740) SpO2:  [95 %-100 %] 99 % (05/23 0725) Weight:  [75.5 kg-79.7 kg] 75.5 kg (05/23 0725)  Weight change: 4.8 kg Filed Weights   06/28/18 0500 06/29/18 0441 06/29/18 0725  Weight: 74.9 kg 79.7 kg 75.5 kg    Intake/Output: I/O last 3 completed shifts: In: 2177.7 [P.O.:960; I.V.:450.9; IV Piggyback:766.8] Out: 2850 [Urine:850; Other:2000]   Intake/Output this shift:  No intake/output data recorded.   Difficult to arouse CVS-regular rate and rhythm no murmurs rubs gallops RS- CTA wheezes or rales IJ dialysis catheter ABD- BS present soft non-distended EXT- no  edema   Basic Metabolic Panel: Recent Labs  Lab 06/26/18 2200 06/26/18 2320 06/27/18 0509 06/27/18 1550 06/28/18 0744 06/29/18 0418  NA 140 139 135 143 136 135  K 4.7 4.5 6.6* 5.1 5.2* 4.9  CL 105  --  99 106 98 96*  CO2 22  --  19* 22 20* 19*  GLUCOSE 68*  --  470* 80 143* 245*  BUN 58*  --  58* 66* 33* 47*  CREATININE 7.73*  --  7.75* 9.00* 6.01* 7.97*  CALCIUM 9.9  --  9.3 9.6 9.8 9.8  MG  --   --  2.0  --   --   --   PHOS  --   --  6.5*  --   --   --     Liver Function Tests: Recent Labs  Lab 06/26/18 2200  AST 23  ALT 19  ALKPHOS 83  BILITOT 0.7  PROT 7.5  ALBUMIN 4.3   No results for input(s): LIPASE, AMYLASE in the last 168 hours. No results for input(s): AMMONIA in the last 168 hours.  CBC: Recent Labs  Lab 06/26/18 2200 06/26/18 2320 06/27/18 0509 06/28/18 0744 06/29/18 0418  WBC 12.6*  --  21.2* 15.5* 12.8*  NEUTROABS 8.2*  --   --   --   --   HGB 12.2* 11.9* 12.3* 13.2 13.6  HCT 38.0* 35.0* 38.3* 40.4 41.0  MCV 93.8  --  94.6 91.8 91.1  PLT 284  --  245 276 269    Cardiac Enzymes: Recent Labs  Lab 06/26/18 2200 06/27/18 0509  CKTOTAL 201 221  BNP: Invalid input(s): POCBNP  CBG: Recent Labs  Lab 06/28/18 1131 06/28/18 1822 06/28/18 2001 06/28/18 2344 06/29/18 0400  GLUCAP 194* 244* 161* 178* 74*    Microbiology: Results for orders placed or performed during the hospital encounter of 06/26/18  SARS Coronavirus 2 (CEPHEID- Performed in [place] hospital lab), Hosp Order     Status: None   Collection Time: 06/26/18 10:50 PM  Result Value Ref Range Status   SARS Coronavirus 2 NEGATIVE NEGATIVE Final    Comment: (NOTE) If result is NEGATIVE SARS-CoV-2 target nucleic acids are NOT DETECTED. The SARS-CoV-2 RNA is generally detectable in upper and lower  respiratory specimens during the acute phase of infection. The lowest  concentration of SARS-CoV-2 viral copies this assay can detect is 250  copies / mL. A negative  result does not preclude SARS-CoV-2 infection  and should not be used as the sole basis for treatment or other  patient management decisions.  A negative result may occur with  improper specimen collection / handling, submission of specimen other  than nasopharyngeal swab, presence of viral mutation(s) within the  areas targeted by this assay, and inadequate number of viral copies  (<250 copies / mL). A negative result must be combined with clinical  observations, patient history, and epidemiological information. If result is POSITIVE SARS-CoV-2 target nucleic acids are DETECTED. The SARS-CoV-2 RNA is generally detectable in upper and lower  respiratory specimens dur ing the acute phase of infection.  Positive  results are indicative of active infection with SARS-CoV-2.  Clinical  correlation with patient history and other diagnostic information is  necessary to determine patient infection status.  Positive results do  not rule out bacterial infection or co-infection with other viruses. If result is PRESUMPTIVE POSTIVE SARS-CoV-2 nucleic acids MAY BE PRESENT.   A presumptive positive result was obtained on the submitted specimen  and confirmed on repeat testing.  While 2019 novel coronavirus  (SARS-CoV-2) nucleic acids may be present in the submitted sample  additional confirmatory testing may be necessary for epidemiological  and / or clinical management purposes  to differentiate between  SARS-CoV-2 and other Sarbecovirus currently known to infect humans.  If clinically indicated additional testing with an alternate test  methodology [phone]) is advised. The SARS-CoV-2 RNA is generally  detectable in upper and lower respiratory sp ecimens during the acute  phase of infection. The expected result is Negative. Fact Sheet for Patients:  StrictlyIdeas.no Fact Sheet for Healthcare Providers: BankingDealers.co.za This test is not yet  approved or cleared by the [place] FDA and has been authorized for detection and/or diagnosis of SARS-CoV-2 by FDA under an Emergency Use Authorization (EUA).  This EUA will remain in effect (meaning this test can be used) for the duration of the COVID-19 declaration under Section 564(b)(1) of the Act, 21 U.S.C. section 360bbb-3(b)(1), unless the authorization is terminated or revoked sooner. Performed at [facility], [place] [address] [place], [place] [address]   Culture, blood (Routine X 2) w Reflex to ID Panel     Status: None (Preliminary result)   Collection Time: 06/27/18 12:52 PM  Result Value Ref Range Status   Specimen Description BLOOD LEFT ANTECUBITAL  Final   Special Requests   Final    BOTTLES DRAWN AEROBIC ONLY Blood Culture adequate volume   Culture   Final    NO GROWTH < 24 HOURS Performed at [facility], [place] [address] [place], [place] [address]    Report Status  PENDING  Incomplete  Culture, blood (Routine X 2) w Reflex to ID Panel     Status: None (Preliminary result)   Collection Time: 06/27/18  1:00 PM  Result Value Ref Range Status   Specimen Description BLOOD RIGHT HAND  Final   Special Requests   Final    BOTTLES DRAWN AEROBIC ONLY Blood Culture adequate volume   Culture   Final    NO GROWTH < 24 HOURS Performed at [facility], [address], [place], [place] [address]    Report Status PENDING  Incomplete  MRSA PCR Screening     Status: None   Collection Time: 06/27/18  1:06 PM  Result Value Ref Range Status   MRSA by PCR NEGATIVE NEGATIVE Final    Comment:        The GeneXpert MRSA Assay (FDA approved for NASAL specimens only), is one component of a comprehensive MRSA colonization surveillance program. It is not intended to diagnose MRSA infection nor to guide or monitor treatment for MRSA infections. Performed at [facility], [place] [address] [place], [place] [address]   CSF culture with Stat gram stain      Status: None (Preliminary result)   Collection Time: 06/27/18  2:49 PM  Result Value Ref Range Status   Specimen Description CSF  Final   Special Requests NONE  Final   Gram Stain   Final    WBC PRESENT, PREDOMINANTLY PMN NO ORGANISMS SEEN CYTOSPIN SMEAR    Culture   Final    NO GROWTH < 24 HOURS Performed at [facility], [place] [address] [place], [place] [address]    Report Status PENDING  Incomplete    Coagulation Studies: Recent Labs    06/26/18 2200  LABPROT 14.4  INR 1.1    Urinalysis: Recent Labs    06/27/18 0509  COLORURINE STRAW*  LABSPEC 1.010  PHURINE 7.0  GLUCOSEU >=500*  HGBUR SMALL*  BILIRUBINUR NEGATIVE  KETONESUR 5*  PROTEINUR >=300*  NITRITE NEGATIVE  LEUKOCYTESUR NEGATIVE      Imaging: Mr Brain Wo Contrast  Result Date: 06/29/2018 CLINICAL DATA:  33 y/o M; history of seizure. Episode of loss of consciousness. EXAM: MRI HEAD WITHOUT CONTRAST TECHNIQUE: Axial DWI, coronal DWI, and sagittal T1 weighted sequences were acquired. Patient was unable to tolerate additional sequences. COMPARISON:  06/26/2018 CT of the head. FINDINGS: On the diffusion weighted sequence there is a punctate focus of reduced diffusion within the right anterior temporal lobe white matter (series 5, image 63). No additional diffusion signal abnormality is evident. Stable ventriculomegaly. Motion degraded sagittal T1 weighted sequence. No gross mass effect is evident. IMPRESSION: Limited examination. Punctate focus of reduced diffusion in the right anterior temporal white matter likely representing a small vessel acute/early subacute infarction. Pattern is atypical for seizure related activity. These results were called by telephone at the time of interpretation on 06/29/2018 at 3:59 am to Dr. Ignatius Specking , who verbally acknowledged these results. Electronically Signed   By: Kristine Garbe M.D.   On: 06/29/2018 04:02   Dg Chest Port 1 View  Result Date:  06/28/2018 CLINICAL DATA:  Respiratory failure. EXAM: PORTABLE CHEST 1 VIEW COMPARISON:  Radiograph Jun 27, 2018. FINDINGS: The heart size and mediastinal contours are within normal limits. Right internal jugular dialysis catheter is unchanged in position. No pneumothorax or pleural effusion is noted. Lungs are clear. The visualized skeletal structures are unremarkable. IMPRESSION: No active disease. Electronically Signed   By: Jeneen Rinks  Murlean Caller M.D.   On: 06/28/2018 08:27   Dg Chest Port 1 View  Result Date: 06/27/2018 CLINICAL DATA:  Fever and hypertension EXAM: PORTABLE CHEST 1 VIEW COMPARISON:  06/26/2018 FINDINGS: Endotracheal tube and nasogastric catheter have been removed. Dialysis catheter is again seen in satisfactory position. Lungs are well aerated bilaterally. Mild right basilar atelectasis is seen. No acute bony abnormality is noted. Cardiac shadow is within normal limits. IMPRESSION: Mild right basilar atelectasis. Electronically Signed   By: Inez Catalina M.D.   On: 06/27/2018 12:35   Dg Abd Portable 1v  Result Date: 06/28/2018 CLINICAL DATA:  Abdominal pain EXAM: PORTABLE ABDOMEN - 1 VIEW COMPARISON:  None. FINDINGS: There is no appreciable bowel dilatation or air-fluid level to suggest bowel obstruction. No free air. There are surgical clips in the right abdomen. IMPRESSION: No bowel obstruction or free air evident. Electronically Signed   By: Lowella Grip III M.D.   On: 06/28/2018 16:44     Medications:   . sodium chloride    . sodium chloride Stopped (06/29/18 0000)  . sodium chloride    . sodium chloride    . acyclovir 375 mg (06/28/18 2230)  . cefTRIAXone (ROCEPHIN)  IV 2 g (06/28/18 2337)  . lactated ringers 25 mL/hr at 06/28/18 1339  . thiamine injection 500 mg (06/29/18 0550)   . chlorhexidine  15 mL Mouth Rinse BID  . Chlorhexidine Gluconate Cloth  6 each Topical Q0600  . Chlorhexidine Gluconate Cloth  6 each Topical Q0600  . folic acid  1 mg Intravenous Daily  .  heparin  5,000 Units Subcutaneous Q8H  . insulin aspart  3-9 Units Subcutaneous Q4H  . insulin detemir  5 Units Subcutaneous Q12H  . mouth rinse  15 mL Mouth Rinse q12n4p  . sodium polystyrene  30 g Rectal Once  . vancomycin variable dose per unstable renal function (pharmacist dosing)   Does not apply See admin instructions   sodium chloride, sodium chloride, sodium chloride, sodium chloride, acetaminophen, alteplase, dextrose, heparin, heparin, hydrALAZINE, labetalol, lidocaine (PF), lidocaine-prilocaine, LORazepam, ondansetron (ZOFRAN) IV, pentafluoroprop-tetrafluoroeth  Assessment/ Plan:   End-stage renal disease [facility] dialysis.  [place] underwent dialysis 06/27/2018 with 2 L removed.  Right IJ dialysis catheter.  His dialysis is 06/29/2018  Anemia stable not issue at this time  Bones.  Follow calcium phosphorus and evaluate for secondary hyperparathyroidism  Diabetes mellitus per primary team   Acute encephalitis possible infectious meningeal encephalitis status post LP 06/27/2018 EtOH negative cocaine positive.  Empiric antibiotics and antivirals prescribed.  Will defer dosing to infectious disease/clinical pharmacy.  Appreciate assistance from pharmacy  Hypertensive emergency has been weaned off Cleviprex we will continue to follow no post ictal.  Add labetalol 200 mg twice daily.  Hypertension/volume appears to be stable from renal standpoint euvolemia     LOS: 3 Sherril Croon @TODAY @7 :54 AM

## 2018-06-29 NOTE — Consult Note (Signed)
Date of Admission:  06/26/2018          Reason for Consult:  Fever, encephalitis, seizures   Referring Provider: Dr. Algis Liming   Assessment:  1. Fever 2. Seizures 3. Encephalopathy with CSF pleocytosis 4. Opiate dependence and suspect IVDU 5. ESRD on HD 6.   Plan:  1. Reconcile hid duplicate medical records so we can access his records from [place], [facility] 2. DC IV antibacterial antibiotics 3. Continue acyclovir though I DOUBT HSV 1, 2 encephalitis 4. Followup blood cultures 5. Followup blood cultures 6. Check TTE 7. Panorex 8. If no source of fevers found would get CT chest abdomen and pelvis  Principal Problem:   Encephalopathy acute Active Problems:   Seizures ([place])   Hypertensive emergency   ESRD (end stage renal disease) on dialysis ([place])   Diabetes type 2, uncontrolled ([place])   Scheduled Meds: . acetaminophen      . chlorhexidine  15 mL Mouth Rinse BID  . Chlorhexidine Gluconate Cloth  6 each Topical Q0600  . Chlorhexidine Gluconate Cloth  6 each Topical Q0600  . folic acid  1 mg Intravenous Daily  . heparin  5,000 Units Subcutaneous Q8H  . insulin aspart  0-5 Units Subcutaneous QHS  . insulin aspart  0-9 Units Subcutaneous TID WC  . insulin detemir  5 Units Subcutaneous Q12H  . labetalol  200 mg Oral BID  . mouth rinse  15 mL Mouth Rinse q12n4p   Continuous Infusions: . sodium chloride    . sodium chloride Stopped (06/29/18 0000)  . sodium chloride    . sodium chloride    . acyclovir 375 mg (06/28/18 2230)  . thiamine injection 500 mg (06/29/18 0550)   PRN Meds:.sodium chloride, sodium chloride, sodium chloride, sodium chloride, acetaminophen, alteplase, dextrose, haloperidol lactate, heparin, heparin, hydrALAZINE, labetalol, lidocaine (PF), lidocaine-prilocaine, ondansetron (ZOFRAN) IV, pentafluoroprop-tetrafluoroeth  HPI: Brent Daniels is a 33 y.o. male with end-stage renal disease on hemodialysis, polysubstance abuse with  suspected IV drug use who has duplicate medical records and has been cared for at [facility] and [facility] though I cannot access the records due to his duplicate records at [facility].  He also has a past medical history significant for seizures.  He had collapsed on the day of admission in front of his friend and was having tonic-clonic seizure activity.  Patient was brought to the ER by EMS and was hypertensive.  He was postictal and confused and agitated.  He ultimately was intubated for airway protection.  He was admitted the ICU.  He had blood cultures with a high fever without clear source and underwent lumbar puncture which showed low-grade CSF pleocytosis with a neutrophilic predominance.  Actual numbers were 113 white blood cells with 95% neutrophils.  Glucose from CSF was 133 and protein 34.  He is now undergoing hemodialysis and seems fairly lucid to me now.  He is a bit evasive about some of his questions.  His imaging that was done included an MRI of the brain without contrast which did not show any evidence of temporal lobe pathology.  Pression of a possible subacute infarct mentioned.  I do not think he has bacterial meningitis certainly CSF is completely inconsistent with this.  He does not have evidence of brain abscesses and currently has no headache whatsoever.  He did endorse a headache for 5 days.  He also endorses chronic nausea and coughing.  He has very poor dentition on exam as well.  I think that herpes encephalitis is unlikely but we will await the PCR's and continue the acyclovir in the meantime.  I would follow-up his blood cultures and obtain a 2D echocardiogram.  I think it more likely that he may be an injection drug user who potentially could have bacteremia and endocarditis as the cause of his fevers.  He could have had septic embolization to the brain.  Be helpful if he can reconcile his medical records it looks like he has 2 medical records at [facility] and this is  interfering with my ability to access care everywhere.  Perhaps IT can fix this.     Review of Systems: Review of Systems  Constitutional: Positive for fever and malaise/fatigue. Negative for chills, diaphoresis and weight loss.  HENT: Negative for congestion, ear pain, hearing loss and sore throat.   Eyes: Negative for blurred vision and double vision.  Respiratory: Positive for cough. Negative for sputum production, shortness of breath, wheezing and stridor.   Cardiovascular: Negative for chest pain, palpitations and leg swelling.  Gastrointestinal: Positive for nausea. Negative for abdominal pain, blood in stool, constipation, diarrhea, heartburn, melena and vomiting.  Genitourinary: Negative for dysuria, flank pain and frequency.  Musculoskeletal: Negative for joint pain and myalgias.  Neurological: Positive for seizures and loss of consciousness. Negative for dizziness, sensory change, focal weakness, weakness and headaches.  Endo/Heme/Allergies: Does not bruise/bleed easily.  Psychiatric/Behavioral: Negative for depression, substance abuse and suicidal ideas. The patient does not have insomnia.     Past Medical History:  Diagnosis Date  . Diabetes mellitus without complication ([place])   . Renal disorder     Social History   Tobacco Use  . Smoking status: Unknown If Ever Smoked  Substance Use Topics  . Alcohol use: Not on file  . Drug use: Not on file    History reviewed. No pertinent family history. No Known Allergies  OBJECTIVE: Blood pressure 127/88, pulse (!) 114, temperature 97.9 F (36.6 C), temperature source Oral, resp. rate 15, height 6' (1.829 m), weight 73 kg, SpO2 99 %.  Physical Exam Vitals signs and nursing note reviewed.  Constitutional:      General: He is not in acute distress.    Appearance: He is not diaphoretic.  HENT:     Head: Normocephalic and atraumatic.     Right Ear: External ear normal.     Left Ear: External ear normal.     Nose: Nose  normal.     Mouth/Throat:     Dentition: Abnormal dentition. Dental caries present.     Pharynx: No oropharyngeal exudate or posterior oropharyngeal erythema.     Tonsils: No tonsillar exudate or tonsillar abscesses.  Eyes:     General: No scleral icterus.    Conjunctiva/sclera: Conjunctivae normal.     Pupils: Pupils are equal, round, and reactive to light.  Neck:     Musculoskeletal: Normal range of motion and neck supple.  Cardiovascular:     Rate and Rhythm: Normal rate and regular rhythm.     Heart sounds: Normal heart sounds. No murmur. No friction rub. No gallop.   Pulmonary:     Effort: Pulmonary effort is normal. No respiratory distress.     Breath sounds: Normal breath sounds. No wheezing or rales.  Abdominal:     General: Bowel sounds are normal. There is no distension.     Palpations: Abdomen is soft.     Tenderness: There is no abdominal tenderness. There is no rebound.  Musculoskeletal: Normal  range of motion.        General: No tenderness.  Lymphadenopathy:     Cervical: No cervical adenopathy.  Skin:    General: Skin is warm and dry.     Coloration: Skin is not pale.     Findings: No erythema or rash.  Neurological:     Mental Status: He is alert.     Coordination: Coordination normal.  Psychiatric:        Attention and Perception: Attention normal.        Speech: Speech is delayed.        Behavior: Behavior normal.        Thought Content: Thought content normal.        Cognition and Memory: He exhibits impaired recent memory.        Judgment: Judgment normal.   slight coating to tongue  Lab Results Lab Results  Component Value Date   WBC 12.8 (H) 06/29/2018   HGB 13.6 06/29/2018   HCT 41.0 06/29/2018   MCV 91.1 06/29/2018   PLT 269 06/29/2018    Lab Results  Component Value Date   CREATININE 7.97 (H) 06/29/2018   BUN 47 (H) 06/29/2018   NA 135 06/29/2018   K 4.9 06/29/2018   CL 96 (L) 06/29/2018   CO2 19 (L) 06/29/2018    Lab Results   Component Value Date   ALT 19 06/26/2018   AST 23 06/26/2018   ALKPHOS 83 06/26/2018   BILITOT 0.7 06/26/2018     Microbiology: Recent Results (from the past 240 hour(s))  SARS Coronavirus 2 (CEPHEID- Performed in [place] hospital lab), Hosp Order     Status: None   Collection Time: 06/26/18 10:50 PM  Result Value Ref Range Status   SARS Coronavirus 2 NEGATIVE NEGATIVE Final    Comment: (NOTE) If result is NEGATIVE SARS-CoV-2 target nucleic acids are NOT DETECTED. The SARS-CoV-2 RNA is generally detectable in upper and lower  respiratory specimens during the acute phase of infection. The lowest  concentration of SARS-CoV-2 viral copies this assay can detect is 250  copies / mL. A negative result does not preclude SARS-CoV-2 infection  and should not be used as the sole basis for treatment or other  patient management decisions.  A negative result may occur with  improper specimen collection / handling, submission of specimen other  than nasopharyngeal swab, presence of viral mutation(s) within the  areas targeted by this assay, and inadequate number of viral copies  (<250 copies / mL). A negative result must be combined with clinical  observations, patient history, and epidemiological information. If result is POSITIVE SARS-CoV-2 target nucleic acids are DETECTED. The SARS-CoV-2 RNA is generally detectable in upper and lower  respiratory specimens dur ing the acute phase of infection.  Positive  results are indicative of active infection with SARS-CoV-2.  Clinical  correlation with patient history and other diagnostic information is  necessary to determine patient infection status.  Positive results do  not rule out bacterial infection or co-infection with other viruses. If result is PRESUMPTIVE POSTIVE SARS-CoV-2 nucleic acids MAY BE PRESENT.   A presumptive positive result was obtained on the submitted specimen  and confirmed on repeat testing.  While 2019 novel  coronavirus  (SARS-CoV-2) nucleic acids may be present in the submitted sample  additional confirmatory testing may be necessary for epidemiological  and / or clinical management purposes  to differentiate between  SARS-CoV-2 and other Sarbecovirus currently known to infect humans.  If clinically  indicated additional testing with an alternate test  methodology [phone]) is advised. The SARS-CoV-2 RNA is generally  detectable in upper and lower respiratory sp ecimens during the acute  phase of infection. The expected result is Negative. Fact Sheet for Patients:  StrictlyIdeas.no Fact Sheet for Healthcare Providers: BankingDealers.co.za This test is not yet approved or cleared by the [place] FDA and has been authorized for detection and/or diagnosis of SARS-CoV-2 by FDA under an Emergency Use Authorization (EUA).  This EUA will remain in effect (meaning this test can be used) for the duration of the COVID-19 declaration under Section 564(b)(1) of the Act, 21 U.S.C. section 360bbb-3(b)(1), unless the authorization is terminated or revoked sooner. Performed at [facility], [place] [address], [place], [place] [address]   Culture, blood (Routine X 2) w Reflex to ID Panel     Status: None (Preliminary result)   Collection Time: 06/27/18 12:52 PM  Result Value Ref Range Status   Specimen Description BLOOD LEFT ANTECUBITAL  Final   Special Requests   Final    BOTTLES DRAWN AEROBIC ONLY Blood Culture adequate volume   Culture   Final    NO GROWTH < 24 HOURS Performed at [facility], [place] [address], [place], [place] [address]    Report Status PENDING  Incomplete  Culture, blood (Routine X 2) w Reflex to ID Panel     Status: None (Preliminary result)   Collection Time: 06/27/18  1:00 PM  Result Value Ref Range Status   Specimen Description BLOOD RIGHT HAND  Final   Special Requests   Final    BOTTLES DRAWN AEROBIC ONLY Blood  Culture adequate volume   Culture   Final    NO GROWTH < 24 HOURS Performed at [facility], [place] [address] [place], [place] [address]    Report Status PENDING  Incomplete  MRSA PCR Screening     Status: None   Collection Time: 06/27/18  1:06 PM  Result Value Ref Range Status   MRSA by PCR NEGATIVE NEGATIVE Final    Comment:        The GeneXpert MRSA Assay (FDA approved for NASAL specimens only), is one component of a comprehensive MRSA colonization surveillance program. It is not intended to diagnose MRSA infection nor to guide or monitor treatment for MRSA infections. Performed at [facility], [place] [address], [place], [place] [address]   CSF culture with Stat gram stain     Status: None (Preliminary result)   Collection Time: 06/27/18  2:49 PM  Result Value Ref Range Status   Specimen Description CSF  Final   Special Requests NONE  Final   Gram Stain   Final    WBC PRESENT, PREDOMINANTLY PMN NO ORGANISMS SEEN CYTOSPIN SMEAR    Culture   Final    NO GROWTH 2 DAYS Performed at [facility], [place] [address], [place], [place] [address]    Report Status PENDING  Incomplete    Alcide Evener, [place] [facility] [phone] pager  06/29/2018, 12:20 PM

## 2018-06-29 NOTE — Progress Notes (Signed)
Per RN patient had received two doses of Ativan to prepare for MRI.  Unable to complete MRI, after 3 sequences ran patient became very irritable, began yelling and grabbing and pulling at the head coil of the scanner. After talking to and trying to calm patient, he yelled he did not like it and began grabbing head coil again, scan aborted.  Images obtained sent to radiologist for review

## 2018-06-30 ENCOUNTER — Inpatient Hospital Stay (HOSPITAL_COMMUNITY): Payer: Medicare Other

## 2018-06-30 DIAGNOSIS — F1490 Cocaine use, unspecified, uncomplicated: Secondary | ICD-10-CM

## 2018-06-30 DIAGNOSIS — J9600 Acute respiratory failure, unspecified whether with hypoxia or hypercapnia: Secondary | ICD-10-CM

## 2018-06-30 DIAGNOSIS — F1290 Cannabis use, unspecified, uncomplicated: Secondary | ICD-10-CM

## 2018-06-30 DIAGNOSIS — K089 Disorder of teeth and supporting structures, unspecified: Secondary | ICD-10-CM

## 2018-06-30 DIAGNOSIS — R509 Fever, unspecified: Secondary | ICD-10-CM

## 2018-06-30 LAB — GLUCOSE, CAPILLARY
Glucose-Capillary: 435 mg/dL — ABNORMAL HIGH (ref 70–99)
Glucose-Capillary: 392 mg/dL — ABNORMAL HIGH (ref 70–99)
Glucose-Capillary: 243 mg/dL — ABNORMAL HIGH (ref 70–99)
Glucose-Capillary: 139 mg/dL — ABNORMAL HIGH (ref 70–99)
Glucose-Capillary: 90 mg/dL (ref 70–99)

## 2018-06-30 LAB — CSF CULTURE W GRAM STAIN: Culture: NO GROWTH

## 2018-06-30 LAB — SEDIMENTATION RATE: Sed Rate: 14 mm/hr (ref 0–16)

## 2018-06-30 LAB — C-REACTIVE PROTEIN: CRP: 2.5 mg/dL — ABNORMAL HIGH (ref <1.0)

## 2018-06-30 MED ORDER — DICYCLOMINE HCL 20 MG PO TABS
20.0000 mg | ORAL_TABLET | Freq: Four times a day (QID) | ORAL | Status: DC | PRN
  Filled 2018-06-30: qty 1

## 2018-06-30 MED ORDER — LOPERAMIDE HCL 2 MG PO CAPS: 2.0000 mg | ORAL_CAPSULE | ORAL | Status: DC | PRN

## 2018-06-30 MED ORDER — PANTOPRAZOLE SODIUM 40 MG IV SOLR
40.0000 mg | Freq: Two times a day (BID) | INTRAVENOUS | Status: DC
  Administered 2018-06-30 – 2018-07-03 (×7): 40 mg via INTRAVENOUS
  Filled 2018-06-30 (×7): qty 40

## 2018-06-30 MED ORDER — HYDROXYZINE HCL 25 MG PO TABS
25.0000 mg | ORAL_TABLET | Freq: Four times a day (QID) | ORAL | Status: DC | PRN
  Administered 2018-07-01: 25 mg via ORAL
  Filled 2018-06-30: qty 1

## 2018-06-30 MED ORDER — INSULIN DETEMIR 100 UNIT/ML ~~LOC~~ SOLN
15.0000 [IU] | Freq: Two times a day (BID) | SUBCUTANEOUS | Status: DC
  Administered 2018-06-30 – 2018-07-01 (×4): 15 [IU] via SUBCUTANEOUS
  Filled 2018-06-30 (×7): qty 0.15

## 2018-06-30 MED ORDER — ALUM & MAG HYDROXIDE-SIMETH 200-200-20 MG/5ML PO SUSP
30.0000 mL | Freq: Once | ORAL | Status: AC
  Administered 2018-06-30: 01:00:00 30 mL via ORAL
  Filled 2018-06-30: qty 30

## 2018-06-30 MED ORDER — ALUM & MAG HYDROXIDE-SIMETH 200-200-20 MG/5ML PO SUSP
30.0000 mL | Freq: Once | ORAL | Status: AC
  Administered 2018-06-30: 06:00:00 30 mL via ORAL
  Filled 2018-06-30: qty 30

## 2018-06-30 MED ORDER — PANTOPRAZOLE SODIUM 40 MG PO TBEC: 40.0000 mg | DELAYED_RELEASE_TABLET | Freq: Every day | ORAL | Status: DC

## 2018-06-30 MED ORDER — METHOCARBAMOL 500 MG PO TABS: 500.0000 mg | ORAL_TABLET | Freq: Three times a day (TID) | ORAL | Status: DC | PRN

## 2018-06-30 MED ORDER — BUPRENORPHINE HCL-NALOXONE HCL 8-2 MG SL SUBL
1.0000 | SUBLINGUAL_TABLET | Freq: Three times a day (TID) | SUBLINGUAL | Status: DC
  Administered 2018-06-30 – 2018-07-03 (×6): 1 via SUBLINGUAL
  Filled 2018-06-30 (×10): qty 1

## 2018-06-30 MED ORDER — INSULIN ASPART 100 UNIT/ML ~~LOC~~ SOLN
3.0000 [IU] | Freq: Three times a day (TID) | SUBCUTANEOUS | Status: DC
  Administered 2018-06-30 – 2018-07-01 (×4): 3 [IU] via SUBCUTANEOUS

## 2018-06-30 MED ORDER — INSULIN ASPART 100 UNIT/ML ~~LOC~~ SOLN
6.0000 [IU] | Freq: Once | SUBCUTANEOUS | Status: AC
  Administered 2018-06-30: 05:00:00 6 [IU] via SUBCUTANEOUS

## 2018-06-30 MED ORDER — HYOSCYAMINE SULFATE 0.125 MG SL SUBL
0.2500 mg | SUBLINGUAL_TABLET | Freq: Once | SUBLINGUAL | Status: DC
  Filled 2018-06-30: qty 2

## 2018-06-30 MED ORDER — ALUM & MAG HYDROXIDE-SIMETH 200-200-20 MG/5ML PO SUSP
30.0000 mL | Freq: Four times a day (QID) | ORAL | Status: DC | PRN
  Administered 2018-06-30 – 2018-07-03 (×2): 30 mL via ORAL
  Filled 2018-06-30 (×2): qty 30

## 2018-06-30 MED ORDER — AMLODIPINE BESYLATE 10 MG PO TABS
10.0000 mg | ORAL_TABLET | Freq: Every day | ORAL | Status: DC
  Administered 2018-06-30 – 2018-07-03 (×3): 10 mg via ORAL
  Filled 2018-06-30 (×5): qty 1

## 2018-06-30 NOTE — Progress Notes (Signed)
[facility] ROUNDING NOTE   Subjective:   33 year old male with end-stage renal disease hemodialysis dependent hypertension diabetes  history of seizures.  He has a right hemodialysis catheter.  He was brought to the emergency room 06/27/2018 with a seizure and confused postictal agitated requiring intubation.  Appreciate the assistance of Dr. Wendie Agreste infectious disease.  We shall be evaluating patient for sources of infection including TEE, Panorex and possible pan CT.  PCR for herpes simplex 1 and 2 pending  He underwent successful dialysis 06/29/2018 with removal of 3.5 L  Blood pressure 138/85 pulse 117 temperature 98.7  Suboxone 1 tablet 3 times daily sublingual, amlodipine 10 mg daily, insulin per sliding scale, Levemir 15 units twice daily, labetalol 200 mg twice daily  IV folic acid IV acyclovir 10 mg per kilogram every 24 hours   No labs drawn this morning  Chest x-ray heart size mediastinal contours within normal range right internal dialysis catheter in place CT scan head and cervical no acute traumatic injury noted ventricular problems on CT scan of brain. CSF evaluation colorless 113 WBCs neutrophils.  Cryptococcal antigen negative.  HIV screen nonreactive MRSA screen negative drug screen positive for cocaine benzodiazepines.    Objective:  Vital signs in last 24 hours:  Temp:  [97.9 F (36.6 C)-98.8 F (37.1 C)] 98.7 F (37.1 C) (05/24 0800) Pulse Rate:  [79-127] 127 (05/24 0837) Resp:  [15-22] 22 (05/24 0800) BP: (114-184)/(71-117) 138/85 (05/24 0837) SpO2:  [97 %-100 %] 99 % (05/24 0837) Weight:  [73 kg] 73 kg (05/23 1135)  Weight change: -4.2 kg Filed Weights   06/29/18 0441 06/29/18 0725 06/29/18 1135  Weight: 79.7 kg 75.5 kg 73 kg    Intake/Output: I/O last 3 completed shifts: In: 4854 [P.O.:1059; I.V.:100; IV Piggyback:250] Out: 6270 [Urine:1300; Other:2787]   Intake/Output this shift:  Total I/O In: 623.8 [I.V.:523.8; IV  Piggyback:100] Out: -    Difficult to arouse CVS-regular rate and rhythm no murmurs rubs gallops RS- CTA wheezes or rales IJ dialysis catheter ABD- BS present soft non-distended EXT- no edema   Basic Metabolic Panel: Recent Labs  Lab 06/26/18 2200 06/26/18 2320 06/27/18 0509 06/27/18 1550 06/28/18 0744 06/29/18 0418  NA 140 139 135 143 136 135  K 4.7 4.5 6.6* 5.1 5.2* 4.9  CL 105  --  99 106 98 96*  CO2 22  --  19* 22 20* 19*  GLUCOSE 68*  --  470* 80 143* 245*  BUN 58*  --  58* 66* 33* 47*  CREATININE 7.73*  --  7.75* 9.00* 6.01* 7.97*  CALCIUM 9.9  --  9.3 9.6 9.8 9.8  MG  --   --  2.0  --   --   --   PHOS  --   --  6.5*  --   --   --     Liver Function Tests: Recent Labs  Lab 06/26/18 2200  AST 23  ALT 19  ALKPHOS 83  BILITOT 0.7  PROT 7.5  ALBUMIN 4.3   No results for input(s): LIPASE, AMYLASE in the last 168 hours. No results for input(s): AMMONIA in the last 168 hours.  CBC: Recent Labs  Lab 06/26/18 2200 06/26/18 2320 06/27/18 0509 06/28/18 0744 06/29/18 0418  WBC 12.6*  --  21.2* 15.5* 12.8*  NEUTROABS 8.2*  --   --   --   --   HGB 12.2* 11.9* 12.3* 13.2 13.6  HCT 38.0* 35.0* 38.3* 40.4 41.0  MCV 93.8  --  94.6 91.8 91.1  PLT 284  --  245 276 269    Cardiac Enzymes: Recent Labs  Lab 06/26/18 2200 06/27/18 0509  CKTOTAL 201 221    BNP: Invalid input(s): POCBNP  CBG: Recent Labs  Lab 06/29/18 1653 06/29/18 1949 06/29/18 2332 06/30/18 0357 06/30/18 0730  GLUCAP 265* 321* 317* 435* 392*    Microbiology: Results for orders placed or performed during the hospital encounter of 06/26/18  SARS Coronavirus 2 (CEPHEID- Performed in [place] hospital lab), Hosp Order     Status: None   Collection Time: 06/26/18 10:50 PM  Result Value Ref Range Status   SARS Coronavirus 2 NEGATIVE NEGATIVE Final    Comment: (NOTE) If result is NEGATIVE SARS-CoV-2 target nucleic acids are NOT DETECTED. The SARS-CoV-2 RNA is generally detectable  in upper and lower  respiratory specimens during the acute phase of infection. The lowest  concentration of SARS-CoV-2 viral copies this assay can detect is 250  copies / mL. A negative result does not preclude SARS-CoV-2 infection  and should not be used as the sole basis for treatment or other  patient management decisions.  A negative result may occur with  improper specimen collection / handling, submission of specimen other  than nasopharyngeal swab, presence of viral mutation(s) within the  areas targeted by this assay, and inadequate number of viral copies  (<250 copies / mL). A negative result must be combined with clinical  observations, patient history, and epidemiological information. If result is POSITIVE SARS-CoV-2 target nucleic acids are DETECTED. The SARS-CoV-2 RNA is generally detectable in upper and lower  respiratory specimens dur ing the acute phase of infection.  Positive  results are indicative of active infection with SARS-CoV-2.  Clinical  correlation with patient history and other diagnostic information is  necessary to determine patient infection status.  Positive results do  not rule out bacterial infection or co-infection with other viruses. If result is PRESUMPTIVE POSTIVE SARS-CoV-2 nucleic acids MAY BE PRESENT.   A presumptive positive result was obtained on the submitted specimen  and confirmed on repeat testing.  While 2019 novel coronavirus  (SARS-CoV-2) nucleic acids may be present in the submitted sample  additional confirmatory testing may be necessary for epidemiological  and / or clinical management purposes  to differentiate between  SARS-CoV-2 and other Sarbecovirus currently known to infect humans.  If clinically indicated additional testing with an alternate test  methodology [phone]) is advised. The SARS-CoV-2 RNA is generally  detectable in upper and lower respiratory sp ecimens during the acute  phase of infection. The expected result is  Negative. Fact Sheet for Patients:  StrictlyIdeas.no Fact Sheet for Healthcare Providers: BankingDealers.co.za This test is not yet approved or cleared by the [place] FDA and has been authorized for detection and/or diagnosis of SARS-CoV-2 by FDA under an Emergency Use Authorization (EUA).  This EUA will remain in effect (meaning this test can be used) for the duration of the COVID-19 declaration under Section 564(b)(1) of the Act, 21 U.S.C. section 360bbb-3(b)(1), unless the authorization is terminated or revoked sooner. Performed at [facility], [place] [address], [place], [place] [address]   Culture, blood (Routine X 2) w Reflex to ID Panel     Status: None (Preliminary result)   Collection Time: 06/27/18 12:52 PM  Result Value Ref Range Status   Specimen Description BLOOD LEFT ANTECUBITAL  Final   Special Requests   Final    BOTTLES DRAWN AEROBIC ONLY Blood Culture adequate volume  Culture   Final    NO GROWTH 2 DAYS Performed at [facility], [place] [address] [place], [place] [address]    Report Status PENDING  Incomplete  Culture, blood (Routine X 2) w Reflex to ID Panel     Status: None (Preliminary result)   Collection Time: 06/27/18  1:00 PM  Result Value Ref Range Status   Specimen Description BLOOD RIGHT HAND  Final   Special Requests   Final    BOTTLES DRAWN AEROBIC ONLY Blood Culture adequate volume   Culture   Final    NO GROWTH 2 DAYS Performed at [facility], [place] [address], [place], [place] [address]    Report Status PENDING  Incomplete  MRSA PCR Screening     Status: None   Collection Time: 06/27/18  1:06 PM  Result Value Ref Range Status   MRSA by PCR NEGATIVE NEGATIVE Final    Comment:        The GeneXpert MRSA Assay (FDA approved for NASAL specimens only), is one component of a comprehensive MRSA colonization surveillance program. It is not intended to diagnose MRSA infection nor  to guide or monitor treatment for MRSA infections. Performed at [facility], [place] [address], [place], [place] [address]   CSF culture with Stat gram stain     Status: None (Preliminary result)   Collection Time: 06/27/18  2:49 PM  Result Value Ref Range Status   Specimen Description CSF  Final   Special Requests NONE  Final   Gram Stain   Final    WBC PRESENT, PREDOMINANTLY PMN NO ORGANISMS SEEN CYTOSPIN SMEAR    Culture   Final    NO GROWTH 2 DAYS Performed at [facility], [place] [address], [place], [place] [address]    Report Status PENDING  Incomplete    Coagulation Studies: No results for input(s): LABPROT, INR in the last 72 hours.  Urinalysis: No results for input(s): COLORURINE, LABSPEC, PHURINE, GLUCOSEU, HGBUR, BILIRUBINUR, KETONESUR, PROTEINUR, UROBILINOGEN, NITRITE, LEUKOCYTESUR in the last 72 hours.  Invalid input(s): APPERANCEUR    Imaging: Mr Brain Wo Contrast  Result Date: 06/29/2018 CLINICAL DATA:  33 y/o M; history of seizure. Episode of loss of consciousness. EXAM: MRI HEAD WITHOUT CONTRAST TECHNIQUE: Axial DWI, coronal DWI, and sagittal T1 weighted sequences were acquired. Patient was unable to tolerate additional sequences. COMPARISON:  06/26/2018 CT of the head. FINDINGS: On the diffusion weighted sequence there is a punctate focus of reduced diffusion within the right anterior temporal lobe white matter (series 5, image 63). No additional diffusion signal abnormality is evident. Stable ventriculomegaly. Motion degraded sagittal T1 weighted sequence. No gross mass effect is evident. IMPRESSION: Limited examination. Punctate focus of reduced diffusion in the right anterior temporal white matter likely representing a small vessel acute/early subacute infarction. Pattern is atypical for seizure related activity. These results were called by telephone at the time of interpretation on 06/29/2018 at 3:59 am to Dr. Ignatius Specking , who verbally acknowledged  these results. Electronically Signed   By: Kristine Garbe M.D.   On: 06/29/2018 04:02   Dg Abd Portable 1v  Result Date: 06/28/2018 CLINICAL DATA:  Abdominal pain EXAM: PORTABLE ABDOMEN - 1 VIEW COMPARISON:  None. FINDINGS: There is no appreciable bowel dilatation or air-fluid level to suggest bowel obstruction. No free air. There are surgical clips in the right abdomen. IMPRESSION: No bowel obstruction or free air evident. Electronically Signed   By: Lowella Grip  III M.D.   On: 06/28/2018 16:44     Medications:   . sodium chloride    . sodium chloride Stopped (06/30/18 0744)  . acyclovir 375 mg (06/29/18 2200)   . amLODipine  10 mg Oral Daily  . buprenorphine-naloxone  1 tablet Sublingual TID  . chlorhexidine  15 mL Mouth Rinse BID  . Chlorhexidine Gluconate Cloth  6 each Topical Q0600  . Chlorhexidine Gluconate Cloth  6 each Topical Q0600  . folic acid  1 mg Intravenous Daily  . heparin  5,000 Units Subcutaneous Q8H  . hyoscyamine  0.25 mg Sublingual Once  . insulin aspart  0-5 Units Subcutaneous QHS  . insulin aspart  0-9 Units Subcutaneous TID WC  . insulin aspart  3 Units Subcutaneous TID WC  . insulin detemir  15 Units Subcutaneous Q12H  . labetalol  200 mg Oral BID  . mouth rinse  15 mL Mouth Rinse q12n4p  . pantoprazole (PROTONIX) IV  40 mg Intravenous Q12H   sodium chloride, sodium chloride, acetaminophen, alum & mag hydroxide-simeth, dextrose, dicyclomine, heparin, hydrALAZINE, hydrOXYzine, labetalol, loperamide, methocarbamol, ondansetron (ZOFRAN) IV  Assessment/ Plan:   End-stage renal disease [facility] dialysis.  [facility]  TTS schedule right IJ dialysis catheter.  His dialysis 06/29/2018 was successful.  His next dialysis treatment be 07/02/2018  Anemia stable not issue at this time  Bones.  Follow calcium phosphorus and evaluate for secondary hyperparathyroidism  Diabetes mellitus per primary team   Acute encephalitis possible infectious  meningeal encephalitis status post LP 06/27/2018 EtOH negative cocaine positive.  PCR of herpes virus is pending.  Appreciate assistance from infectious disease Dr. Wendie Agreste  Hypertensive emergency seems to have improved now on his amlodipine 10 mg daily and labetalol 200 mg twice daily.  Sinus tachycardia secondary to drug withdrawal.  Sublinguals Suboxone started  Hypertension/volume appears to be stable from renal standpoint euvolemia     LOS: [place] @TODAY @10 :18 AM

## 2018-06-30 NOTE — Progress Notes (Signed)
  Echocardiogram 2D Echocardiogram has been attempted. Patient vomited. Reattempt echo at later date.  Brent Daniels 06/30/2018, 3:17 PM

## 2018-06-30 NOTE — Progress Notes (Signed)
Neurology Progress Note   S:// Patient seen and examined this morning. He complains of abdominal discomfort, vomiting and abdominal pain that has been ongoing since yesterday. Primary attending Dr. Algis Liming is aware and actively managing at this time. He seems to be fully familiar and aware of all his medications and providers on the outside. Contrary to the report obtained from his outside dialysis center that he is noncompliant, he reports compliance.  O:// Current vital signs: BP 124/79 (BP Location: Left Arm)   Pulse (!) 125   Temp 98.7 F (37.1 C)   Resp (!) 22   Ht 6' (1.829 m)   Wt 73 kg   SpO2 97%   BMI 21.83 kg/m  Vital signs in last 24 hours: Temp:  [97.9 F (36.6 C)-98.8 F (37.1 C)] 98.7 F (37.1 C) (05/24 0800) Pulse Rate:  [79-125] 125 (05/24 0800) Resp:  [15-22] 22 (05/24 0800) BP: (114-184)/(71-117) 124/79 (05/24 0800) SpO2:  [97 %-100 %] 97 % (05/24 0800) Weight:  [73 kg] 73 kg (05/23 1135) GENERAL: Awake, alert in NAD HEENT: - Normocephalic and atraumatic, dry mm, no LN++, no Thyromegally LUNGS - Clear to auscultation bilaterally with no wheezes CV - S1S2 RRR, no m/r/g, equal pulses bilaterally. ABDOMEN -tender to palpation all over Ext: warm, well perfused, intact peripheral pulses, no edema NEURO:  Mental Status: AA&Ox3  Language: speech is non-dysarthric but he has poor dentition.  Naming, repetition, fluency, and comprehension intact. Cranial Nerves: PERRL . EOMI, visual fields full, no facial asymmetry, facial sensation intact, hearing intact, tongue/uvula/soft palate midline, normal sternocleidomastoid and trapezius muscle strength. No evidence of tonge atrophy or fibrillations Motor: Antigravity 5/5 in all fours Tone: is normal and bulk is normal Sensation- Intact to light touch bilaterally Coordination: FTN intact bilaterally Gait- deferred  Medications  Current Facility-Administered Medications:  .  0.9 %  sodium chloride infusion, 100 mL,  Intravenous, PRN, Parrett, Tammy S, NP .  0.9 %  sodium chloride infusion, 100 mL, Intravenous, PRN, Parrett, Tammy S, NP, Stopped at 06/30/18 0744 .  acetaminophen (TYLENOL) tablet 500 mg, 500 mg, Oral, Q6H PRN, Hongalgi, Anand D, MD .  acyclovir (ZOVIRAX) 375 mg in dextrose 5 % 100 mL IVPB, 5 mg/kg, Intravenous, Q24H, Einar Grad, RPH, Last Rate: 107.5 mL/hr at 06/29/18 2200, 375 mg at 06/29/18 2200 .  alum & mag hydroxide-simeth (MAALOX/MYLANTA) 200-200-20 MG/5ML suspension 30 mL, 30 mL, Oral, Q6H PRN, Hongalgi, Anand D, MD .  amLODipine (NORVASC) tablet 10 mg, 10 mg, Oral, Daily, Hongalgi, Anand D, MD .  buprenorphine-naloxone (SUBOXONE) 8-2 mg per SL tablet 1 tablet, 1 tablet, Sublingual, TID, Hongalgi, Anand D, MD .  chlorhexidine (PERIDEX) 0.12 % solution 15 mL, 15 mL, Mouth Rinse, BID, Parrett, Tammy S, NP, 15 mL at 06/29/18 2128 .  Chlorhexidine Gluconate Cloth 2 % PADS 6 each, 6 each, Topical, Q0600, Parrett, Tammy S, NP, 6 each at 06/30/18 0600 .  Chlorhexidine Gluconate Cloth 2 % PADS 6 each, 6 each, Topical, Q0600, Edrick Oh, MD, 6 each at 06/30/18 0600 .  dextrose 50 % solution 25 g, 25 g, Intravenous, Q1H PRN, Parrett, Tammy S, NP .  dicyclomine (BENTYL) tablet 20 mg, 20 mg, Oral, Q6H PRN, Hongalgi, Anand D, MD .  folic acid injection 1 mg, 1 mg, Intravenous, Daily, Parrett, Tammy S, NP, 1 mg at 06/29/18 1429 .  heparin injection 3,100 Units, 40 Units/kg, Dialysis, PRN, Parrett, Tammy S, NP, 3,100 Units at 06/28/18 0332 .  heparin injection 5,000 Units,  5,000 Units, Subcutaneous, Q8H, Parrett, Tammy S, NP, 5,000 Units at 06/30/18 0600 .  hydrALAZINE (APRESOLINE) injection 10-20 mg, 10-20 mg, Intravenous, Q4H PRN, Parrett, Tammy S, NP, 20 mg at 06/30/18 3875 .  hydrOXYzine (ATARAX/VISTARIL) tablet 25 mg, 25 mg, Oral, Q6H PRN, Hongalgi, Anand D, MD .  hyoscyamine (LEVSIN SL) SL tablet 0.25 mg, 0.25 mg, Sublingual, Once, Blount, Xenia T, NP .  insulin aspart (novoLOG)  injection 0-5 Units, 0-5 Units, Subcutaneous, QHS, Hongalgi, Lenis Dickinson, MD, 4 Units at 06/29/18 2135 .  insulin aspart (novoLOG) injection 0-9 Units, 0-9 Units, Subcutaneous, TID WC, Hongalgi, Lenis Dickinson, MD, 9 Units at 06/30/18 0734 .  insulin aspart (novoLOG) injection 3 Units, 3 Units, Subcutaneous, TID WC, Hongalgi, Lenis Dickinson, MD, 3 Units at 06/30/18 [phone] .  insulin detemir (LEVEMIR) injection 15 Units, 15 Units, Subcutaneous, Q12H, Hongalgi, Anand D, MD .  labetalol (NORMODYNE) injection 20 mg, 20 mg, Intravenous, Q10 min PRN, Parrett, Tammy S, NP, 20 mg at 06/28/18 2951 .  labetalol (NORMODYNE) tablet 200 mg, 200 mg, Oral, BID, Edrick Oh, MD, 200 mg at 06/29/18 1330 .  loperamide (IMODIUM) capsule 2-4 mg, 2-4 mg, Oral, PRN, Hongalgi, Anand D, MD .  MEDLINE mouth rinse, 15 mL, Mouth Rinse, q12n4p, Parrett, Tammy S, NP .  methocarbamol (ROBAXIN) tablet 500 mg, 500 mg, Oral, Q8H PRN, Hongalgi, Anand D, MD .  ondansetron (ZOFRAN) injection 4 mg, 4 mg, Intravenous, Q6H PRN, Parrett, Tammy S, NP, 4 mg at 06/29/18 1914 .  pantoprazole (PROTONIX) injection 40 mg, 40 mg, Intravenous, Q12H, Modena Jansky, MD Labs CBC    Component Value Date/Time   WBC 12.8 (H) 06/29/2018 0418   RBC 4.50 06/29/2018 0418   HGB 13.6 06/29/2018 0418   HCT 41.0 06/29/2018 0418   PLT 269 06/29/2018 0418   MCV 91.1 06/29/2018 0418   MCH 30.2 06/29/2018 0418   MCHC 33.2 06/29/2018 0418   RDW 16.7 (H) 06/29/2018 0418   LYMPHSABS 2.8 06/26/2018 2200   MONOABS 0.9 06/26/2018 2200   EOSABS 0.6 (H) 06/26/2018 2200   BASOSABS 0.1 06/26/2018 2200    CMP     Component Value Date/Time   NA 135 06/29/2018 0418   K 4.9 06/29/2018 0418   CL 96 (L) 06/29/2018 0418   CO2 19 (L) 06/29/2018 0418   GLUCOSE 245 (H) 06/29/2018 0418   BUN 47 (H) 06/29/2018 0418   CREATININE 7.97 (H) 06/29/2018 0418   CALCIUM 9.8 06/29/2018 0418   PROT 7.5 06/26/2018 2200   ALBUMIN 4.3 06/26/2018 2200   AST 23 06/26/2018 2200   ALT 19  06/26/2018 2200   ALKPHOS 83 06/26/2018 2200   BILITOT 0.7 06/26/2018 2200   GFRNONAA 8 (L) 06/29/2018 0418   GFRAA 9 (L) 06/29/2018 0418   CSF with glucose 133, protein 34, 113 white blood cells with 95% segmented neutrophils and 1% lymphocytes.  Negative cryptococcal antigen.  CSF bacterial and fungal culture negative thus far.  HSV PCR pending HSV PCR might be delayed because of technical delays at the outside lab where the samples are processed because of the backlog from current covered pandemic.   EEG on 5/21 with generalized slowing  Imaging I have reviewed images in epic and the results pertinent to this consultation are: MRI brain-limited exam, punctate focus of restricted diffusion in the right anterior temporal white matter likely an incidental early subacute infarct- no other abnormalities to suggest any bleeding, evolving infarct or evidence of posterior reversible encephalopathy syndrome.  Assessment:  33 year old man past history of DM, ESRD on hemodialysis with noncompliance, opiate dependence possible substance abuse brought in for concern for possible seizure and collapse at a friend's house. Urine toxicology screen positive for cocaine.  Admits to cocaine and crack cocaine abuse recently.  Extremely hypertensive on arrival and hyperglycemic. Neurological consultation for seizure, altered mental status. 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 Remains very agitated and had to be intubated, eventually extubated and transferred to the floor from the ICU. Very coherent on his exam today, with no focal deficits neurologically.  Complaining of abdominal discomfort.  Primary team treating for Suboxone withdrawal and resuming various on medications to minimize withdrawal symptoms. His CSF examination done because of altered mental status and normal clear cause of the fever and leukocytosis had a perplexing picture.  CSF analysis  thus far has been unremarkable for infectious process.  The elevated glucose could presumably be from his systemic hyperglycemia because his sugars were in the 400s that morning.  The high cell count could be reactive from vasospastic process due to drug abuse. HSV PCR is pending nonetheless and reporting is delayed due to the current COVID pandemic related backlog's in the laboratories.  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 -History of substance abuse and opioid dependence  Recommendations: Would defer antibiotics/antiviral recommendations to ID. The area of restricted diffusion on the brain MRI is incidental and I do not think it has any bearing on his clinical presentation. I would not start him on any antiepileptics for now. I would maintain seizure precautions-detailed below. I would recommend following on the infectious work-up as recommended by ID-echo, cultures, dynamic x-ray and possibly pan CT scan. Blood pressure goals- maintain below 140/90  I discussed my plan in detail with Dr. Algis Liming in person on the floor.  Inpatient neurology service will be available as needed.  Please call with questions.  -- Amie Portland, MD [facility] Pager: [phone] If 7pm to 7am, please call on call as listed on AMION.

## 2018-06-30 NOTE — Progress Notes (Signed)
PROGRESS NOTE   Brent Daniels  RSW:[id number]    DOB: 08-May-1985    DOA: 06/26/2018  PCP: Glenda Chroman, MD   I have briefly reviewed patients previous medical records in [place].  Brief Narrative:  CCM transfer to [facility] 52/73: 33 year old male with PMH of ESRD on HD, noncompliant, HTN, DM 2, seizures but not on AEDs,Opiate dependence, substance abuse, on day of admission suddenly collapsed in front of his friend and had witnessed tonic-clonic seizure activity.  EMS noted normal BS, hypertensive (SBP's in the 200s) on arrival to ED, postictal, very confused and agitated, intubated in ED for CT of head and then successfully extubated shortly thereafter.  He was admitted to ICU by CCM.  Initial concern was for seizures.  Subsequently spiked a high fever with leukocytosis without clear source, underwent spinal tap and started on broad-spectrum antibiotics/acyclovir for possible meningoencephalitis.  Neurology suspects TME likely due to combination of cocaine induced vasospasm, hypertensive emergency/PRES and less likely due to CNS infection.  Nephrology on board for HD needs.  ID consulted 5/23.  Mental status changes resolved.   Assessment & Plan:   Principal Problem:   Encephalopathy acute Active Problems:   Seizures ([name])   Hypertensive emergency   ESRD (end stage renal disease) on dialysis ([place])   Diabetes type 2, uncontrolled ([place])   Toxic metabolic encephalopathy  Head and C-spine 5/20 without acute findings.  MRI brain 5/23: Limited exam due to lack of cooperation.  Reported findings suggestive of small vessel acute/early subacute infarction in the right anterior temporal white matter.  However I discussed this finding with Dr. Rory Percy who indicates that this is not a stroke or a significant finding.  EEG 5/21: Abnormal, generalized background slowing with superimposed beta activity.  No epileptiform activity noted.  UDS 5/21: Positive for benzodiazepines and cocaine.  BAL <10.   CSF 5/21: WBC 113, segmented neutrophils 95.  Glucose 133 and CSF protein 34.  CSF culture: Negative to date.  Blood cultures x2: Negative to date.  Urine pneumococcal and Legionella antigen negative.  Acute hepatitis panel negative, HIV screen negative x2, RPR negative and cryptococcal antigen negative.  SARS coronavirus 2: Negative.  Initial concern on admission was for seizures and postictal state.  He had been loaded with IV Keppra.  However after extensive work-up and as discussed with Dr. Rory Percy today, no CVA, less likely to be seizures, possible gabapentin-induced myoclonus in the setting of renal insufficiency, concern for meningoencephalitis (but as discussed with Dr. Tommy Medal this does not look like bacterial infection and stopped antibiotics but continue IV acyclovir pending HSV results), hypertensive emergency/PRES and uremia.  No maintenance AEDs.  Discontinued antibiotics as per ID recommendations.  Continue IV acyclovir pending HSV results- prolonged turnaround time due to pandemic issues.  Continue to aim for better control of antihypertension and DM.  Continue HD per nephrology.  Delirium precautions.  Minimize sedatives/opioids.  Continue folate and IV thiamine.  Mental status changes/acute encephalopathy resolved.  Fever/possible meningoencephalitis  Extensive evaluation as noted above.  All antibiotics discontinued 5/23 as per ID recommendations.  Continue IV acyclovir pending HSV results.  Chest x-ray negative.  Had initial fever of 102.4 on 5/21 and then again on a 1.3 on 5/22.  ID input appreciated >recommends checking TTE, Panorex, if no source of fevers found then CT chest abdomen and pelvis.  No fever since 5/22  Hypertensive emergency  Weaned off Cleviprex 5/21.  Labetalol 200 mg twice daily added 5/23.  Volume  management across HD but does not appear volume overloaded.  Also on PRN IV hydralazine and labetalol  Blood pressures remained  elevated this morning.  Resumed home amlodipine 10 mg daily.  Discontinued HCTZ due to ESRD on HD.  Home carvedilol not to be resumed at discharge due to active cocaine use history.  ESRD on HD  Management per nephrology.  Reportedly noncompliant as outpatient and only been to HD 3 times in the month of May.  He undergoes outpatient dialysis at [facility] in [place] on TTS schedule.  Dialyzed through right IJ HD catheter.  I discussed with Dr. Justin Mend, Nephrology.  Last HD was 5/23 and next HD will be on 5/26  Hyperkalemia  Resolved after HD.  Type II DM with renal complications  H4V 8.8 suggests poor outpatient control.  Reports checking CBGs multiple times at home but this history is not reliable.  Claims that he takes Levemir 20 units twice daily and SSI at home.  CBGs markedly elevated as noted below.  Increase Levemir to 15 units twice daily, continue NovoLog sensitive SSI and added NovoLog mealtime 3 units.  Monitor closely and adjust insulins as needed.  Substance abuse/cocaine  Counseling when able.  Chronic pain  Patient reports that he follows with Dr. Lonell Grandchild in [place] who prescribes his Suboxone.  Resumed Suboxone 5/24.  Abdominal pain and nonbloody emesis  KUB recently without acute findings.  Possibly related to opioid withdrawal.   Resumed Suboxone with improvement as per discussion with RN.  Continue PPI and Maalox as needed.  Monitor closely.  DVT prophylaxis: Subcutaneous heparin Code Status: Full Family Communication: None at bedside Disposition: DC home pending clinical improvement   Consultants:  PCCM-signed off Nephrology Neurology ID  Procedures:  Intubated/extubated in ED. LP 5/21 HD  Antimicrobials:  IV acyclovir 5/21 > IV ceftriaxone 5/21-5/22, discontinued 5/23 IV vancomycin x1 on 5/21, discontinued 5/23   Subjective: Patient interviewed and examined along with RN in room.  Coherent today and answers questions  appropriately.  He lives with his mom.  Had the seizure-like episode while he was at his friend's house but has no recollection of the events.  Last seizure-like episode prior to that event was approximately 4 months ago.  Reports TTS HD in [place]/Dr. Lowanda Foster his nephrologist.  Follows with Dr. Rosita Fire in [place] for chronic pain who prescribes his Suboxone that he takes 3 times daily.  He reports using cocaine/crack cocaine either on the day or day before his hospitalization.  4-5 episodes of nonbloody emesis overnight, one time this morning with associated mid abdominal discomfort.  No BM for a couple days now.  ROS: As above, otherwise negative.  Objective:  Vitals:   06/30/18 0726 06/30/18 0756 06/30/18 0800 06/30/18 0837  BP: (!) 181/106  124/79 138/85  Pulse:   (!) 125 (!) 127  Resp:   (!) 22   Temp:  98.7 F (37.1 C) 98.7 F (37.1 C)   TempSrc:  Oral    SpO2:   97% 99%  Weight:      Height:        Examination:  General exam: Young male, moderately built and nourished, lying comfortably supine in bed.  Looks much improved compared to yesterday. Respiratory system: Clear to auscultation. Respiratory effort normal.  Right anterior chest IJ HD catheter Cardiovascular system: S1 & S2 heard, RRR. No JVD, murmurs, rubs, gallops or clicks. No pedal edema.  Telemetry personally reviewed: Mild sinus tachycardia. Gastrointestinal system: Abdomen is nondistended, soft.  Mild  periumbilical/suprapubic tenderness without peritoneal signs. No organomegaly or masses felt. Normal bowel sounds heard. Central nervous system: Alert and oriented x4. No focal neurological deficits. Extremities: Symmetric 5 x 5 power. Skin: No rashes, lesions or ulcers Psychiatry: Judgement and insight impaired. Mood & affect cannot be assessed at this time.     Data Reviewed: I have personally reviewed following labs and imaging studies  CBC: Recent Labs  Lab 06/26/18 2200 06/26/18 2320 06/27/18 0509  06/28/18 0744 06/29/18 0418  WBC 12.6*  --  21.2* 15.5* 12.8*  NEUTROABS 8.2*  --   --   --   --   HGB 12.2* 11.9* 12.3* 13.2 13.6  HCT 38.0* 35.0* 38.3* 40.4 41.0  MCV 93.8  --  94.6 91.8 91.1  PLT 284  --  245 276 998   Basic Metabolic Panel: Recent Labs  Lab 06/26/18 2200 06/26/18 2320 06/27/18 0509 06/27/18 1550 06/28/18 0744 06/29/18 0418  NA 140 139 135 143 136 135  K 4.7 4.5 6.6* 5.1 5.2* 4.9  CL 105  --  99 106 98 96*  CO2 22  --  19* 22 20* 19*  GLUCOSE 68*  --  470* 80 143* 245*  BUN 58*  --  58* 66* 33* 47*  CREATININE 7.73*  --  7.75* 9.00* 6.01* 7.97*  CALCIUM 9.9  --  9.3 9.6 9.8 9.8  MG  --   --  2.0  --   --   --   PHOS  --   --  6.5*  --   --   --    Liver Function Tests: Recent Labs  Lab 06/26/18 2200  AST 23  ALT 19  ALKPHOS 83  BILITOT 0.7  PROT 7.5  ALBUMIN 4.3   Coagulation Profile: Recent Labs  Lab 06/26/18 2200  INR 1.1   Cardiac Enzymes: Recent Labs  Lab 06/26/18 2200 06/27/18 0509  CKTOTAL 201 221   HbA1C: No results for input(s): HGBA1C in the last 72 hours. CBG: Recent Labs  Lab 06/29/18 1949 06/29/18 2332 06/30/18 0357 06/30/18 0730 06/30/18 1217  GLUCAP 321* 317* 435* 392* 243*    Recent Results (from the past 240 hour(s))  SARS Coronavirus 2 (CEPHEID- Performed in [place] hospital lab), Hosp Order     Status: None   Collection Time: 06/26/18 10:50 PM  Result Value Ref Range Status   SARS Coronavirus 2 NEGATIVE NEGATIVE Final    Comment: (NOTE) If result is NEGATIVE SARS-CoV-2 target nucleic acids are NOT DETECTED. The SARS-CoV-2 RNA is generally detectable in upper and lower  respiratory specimens during the acute phase of infection. The lowest  concentration of SARS-CoV-2 viral copies this assay can detect is 250  copies / mL. A negative result does not preclude SARS-CoV-2 infection  and should not be used as the sole basis for treatment or other  patient management decisions.  A negative result may  occur with  improper specimen collection / handling, submission of specimen other  than nasopharyngeal swab, presence of viral mutation(s) within the  areas targeted by this assay, and inadequate number of viral copies  (<250 copies / mL). A negative result must be combined with clinical  observations, patient history, and epidemiological information. If result is POSITIVE SARS-CoV-2 target nucleic acids are DETECTED. The SARS-CoV-2 RNA is generally detectable in upper and lower  respiratory specimens dur ing the acute phase of infection.  Positive  results are indicative of active infection with SARS-CoV-2.  Clinical  correlation with  patient history and other diagnostic information is  necessary to determine patient infection status.  Positive results do  not rule out bacterial infection or co-infection with other viruses. If result is PRESUMPTIVE POSTIVE SARS-CoV-2 nucleic acids MAY BE PRESENT.   A presumptive positive result was obtained on the submitted specimen  and confirmed on repeat testing.  While 2019 novel coronavirus  (SARS-CoV-2) nucleic acids may be present in the submitted sample  additional confirmatory testing may be necessary for epidemiological  and / or clinical management purposes  to differentiate between  SARS-CoV-2 and other Sarbecovirus currently known to infect humans.  If clinically indicated additional testing with an alternate test  methodology [phone]) is advised. The SARS-CoV-2 RNA is generally  detectable in upper and lower respiratory sp ecimens during the acute  phase of infection. The expected result is Negative. Fact Sheet for Patients:  StrictlyIdeas.no Fact Sheet for Healthcare Providers: BankingDealers.co.za This test is not yet approved or cleared by the [place] FDA and has been authorized for detection and/or diagnosis of SARS-CoV-2 by FDA under an Emergency Use Authorization (EUA).  This  EUA will remain in effect (meaning this test can be used) for the duration of the COVID-19 declaration under Section 564(b)(1) of the Act, 21 U.S.C. section 360bbb-3(b)(1), unless the authorization is terminated or revoked sooner. Performed at [facility], [place] [address], [place], [place] [address]   Culture, blood (Routine X 2) w Reflex to ID Panel     Status: None (Preliminary result)   Collection Time: 06/27/18 12:52 PM  Result Value Ref Range Status   Specimen Description BLOOD LEFT ANTECUBITAL  Final   Special Requests   Final    BOTTLES DRAWN AEROBIC ONLY Blood Culture adequate volume   Culture   Final    NO GROWTH 3 DAYS Performed at [facility], [place] [address] [place], [place] [address]    Report Status PENDING  Incomplete  Culture, blood (Routine X 2) w Reflex to ID Panel     Status: None (Preliminary result)   Collection Time: 06/27/18  1:00 PM  Result Value Ref Range Status   Specimen Description BLOOD RIGHT HAND  Final   Special Requests   Final    BOTTLES DRAWN AEROBIC ONLY Blood Culture adequate volume   Culture   Final    NO GROWTH 3 DAYS Performed at [facility], [place] [address] [place], [place] [address]    Report Status PENDING  Incomplete  MRSA PCR Screening     Status: None   Collection Time: 06/27/18  1:06 PM  Result Value Ref Range Status   MRSA by PCR NEGATIVE NEGATIVE Final    Comment:        The GeneXpert MRSA Assay (FDA approved for NASAL specimens only), is one component of a comprehensive MRSA colonization surveillance program. It is not intended to diagnose MRSA infection nor to guide or monitor treatment for MRSA infections. Performed at [facility], [place] [address] [place], [place] [address]   CSF culture with Stat gram stain     Status: None (Preliminary result)   Collection Time: 06/27/18  2:49 PM  Result Value Ref Range Status   Specimen Description CSF  Final   Special Requests NONE  Final   Gram Stain    Final    WBC PRESENT, PREDOMINANTLY PMN NO ORGANISMS SEEN CYTOSPIN SMEAR    Culture   Final    NO GROWTH 3 DAYS Performed  at [facility], [place] [address], [place], [place] [address]    Report Status PENDING  Incomplete         Radiology Studies: Mr Brain 64 Contrast  Result Date: 06/29/2018 CLINICAL DATA:  33 y/o M; history of seizure. Episode of loss of consciousness. EXAM: MRI HEAD WITHOUT CONTRAST TECHNIQUE: Axial DWI, coronal DWI, and sagittal T1 weighted sequences were acquired. Patient was unable to tolerate additional sequences. COMPARISON:  06/26/2018 CT of the head. FINDINGS: On the diffusion weighted sequence there is a punctate focus of reduced diffusion within the right anterior temporal lobe white matter (series 5, image 63). No additional diffusion signal abnormality is evident. Stable ventriculomegaly. Motion degraded sagittal T1 weighted sequence. No gross mass effect is evident. IMPRESSION: Limited examination. Punctate focus of reduced diffusion in the right anterior temporal white matter likely representing a small vessel acute/early subacute infarction. Pattern is atypical for seizure related activity. These results were called by telephone at the time of interpretation on 06/29/2018 at 3:59 am to Dr. Ignatius Specking , who verbally acknowledged these results. Electronically Signed   By: Kristine Garbe M.D.   On: 06/29/2018 04:02   Dg Abd Portable 1v  Result Date: 06/28/2018 CLINICAL DATA:  Abdominal pain EXAM: PORTABLE ABDOMEN - 1 VIEW COMPARISON:  None. FINDINGS: There is no appreciable bowel dilatation or air-fluid level to suggest bowel obstruction. No free air. There are surgical clips in the right abdomen. IMPRESSION: No bowel obstruction or free air evident. Electronically Signed   By: Lowella Grip III M.D.   On: 06/28/2018 16:44        Scheduled Meds: . amLODipine  10 mg Oral Daily  . buprenorphine-naloxone  1 tablet Sublingual TID  .  chlorhexidine  15 mL Mouth Rinse BID  . Chlorhexidine Gluconate Cloth  6 each Topical Q0600  . Chlorhexidine Gluconate Cloth  6 each Topical Q0600  . folic acid  1 mg Intravenous Daily  . heparin  5,000 Units Subcutaneous Q8H  . hyoscyamine  0.25 mg Sublingual Once  . insulin aspart  0-5 Units Subcutaneous QHS  . insulin aspart  0-9 Units Subcutaneous TID WC  . insulin aspart  3 Units Subcutaneous TID WC  . insulin detemir  15 Units Subcutaneous Q12H  . labetalol  200 mg Oral BID  . mouth rinse  15 mL Mouth Rinse q12n4p  . pantoprazole (PROTONIX) IV  40 mg Intravenous Q12H   Continuous Infusions: . sodium chloride    . sodium chloride Stopped (06/30/18 0744)  . acyclovir 375 mg (06/29/18 2200)     LOS: 4 days     Vernell Leep, MD, FACP, [facility]. [facility]  To contact the attending provider between 7A-7P or the covering provider during after hours 7P-7A, please log into the web site [email or web address] and access using universal [place] password for that web site. If you do not have the password, please call the hospital operator.  06/30/2018, 12:47 PM

## 2018-06-30 NOTE — Progress Notes (Addendum)
Subjective: No new complaints   Antibiotics:  Anti-infectives (From admission, onward)   Start     Dose/Rate Route Frequency Ordered Stop   06/28/18 2200  acyclovir (ZOVIRAX) 375 mg in dextrose 5 % 100 mL IVPB     5 mg/kg  74.9 kg 107.5 mL/hr over 60 Minutes Intravenous Every 24 hours 06/28/18 1023     06/27/18 2000  vancomycin (VANCOCIN) IVPB 750 mg/150 ml premix  Status:  Discontinued     750 mg 150 mL/hr over 60 Minutes Intravenous  Once 06/27/18 1520 06/28/18 1010   06/27/18 1700  acyclovir (ZOVIRAX) 765 mg in dextrose 5 % 150 mL IVPB  Status:  Discontinued     10 mg/kg  76.6 kg 165.3 mL/hr over 60 Minutes Intravenous Every 24 hours 06/27/18 1629 06/28/18 1023   06/27/18 1645  cefTRIAXone (ROCEPHIN) 2 g in sodium chloride 0.9 % 100 mL IVPB  Status:  Discontinued     2 g 200 mL/hr over 30 Minutes Intravenous Every 12 hours 06/27/18 1454 06/29/18 1128   06/27/18 1600  vancomycin (VANCOCIN) 1,500 mg in sodium chloride 0.9 % 500 mL IVPB     1,500 mg 250 mL/hr over 120 Minutes Intravenous  Once 06/27/18 1520 06/27/18 1957   06/27/18 1520  vancomycin variable dose per unstable renal function (pharmacist dosing)  Status:  Discontinued      Does not apply See admin instructions 06/27/18 1520 06/29/18 1128   06/27/18 1500  Ampicillin-Sulbactam (UNASYN) 3 g in sodium chloride 0.9 % 100 mL IVPB  Status:  Discontinued     3 g 200 mL/hr over 30 Minutes Intravenous Every 24 hours 06/27/18 1356 06/27/18 1440   06/27/18 1500  cefTRIAXone (ROCEPHIN) 1 g in sodium chloride 0.9 % 100 mL IVPB  Status:  Discontinued     1 g 200 mL/hr over 30 Minutes Intravenous Every 12 hours 06/27/18 1450 06/27/18 1454   06/27/18 1445  cefTRIAXone (ROCEPHIN) injection 1 g  Status:  Discontinued     1 g Intramuscular Every 24 hours 06/27/18 1440 06/27/18 1449      Medications: Scheduled Meds: . amLODipine  10 mg Oral Daily  . buprenorphine-naloxone  1 tablet Sublingual TID  . chlorhexidine  15  mL Mouth Rinse BID  . Chlorhexidine Gluconate Cloth  6 each Topical Q0600  . Chlorhexidine Gluconate Cloth  6 each Topical Q0600  . folic acid  1 mg Intravenous Daily  . heparin  5,000 Units Subcutaneous Q8H  . hyoscyamine  0.25 mg Sublingual Once  . insulin aspart  0-5 Units Subcutaneous QHS  . insulin aspart  0-9 Units Subcutaneous TID WC  . insulin aspart  3 Units Subcutaneous TID WC  . insulin detemir  15 Units Subcutaneous Q12H  . labetalol  200 mg Oral BID  . mouth rinse  15 mL Mouth Rinse q12n4p  . pantoprazole (PROTONIX) IV  40 mg Intravenous Q12H   Continuous Infusions: . sodium chloride    . sodium chloride Stopped (06/30/18 0744)  . acyclovir 375 mg (06/29/18 2200)   PRN Meds:.sodium chloride, sodium chloride, acetaminophen, alum & mag hydroxide-simeth, dextrose, dicyclomine, heparin, hydrALAZINE, hydrOXYzine, labetalol, loperamide, methocarbamol, ondansetron (ZOFRAN) IV    Objective: Weight change: -4.2 kg  Intake/Output Summary (Last 24 hours) at 06/30/2018 1418 Last data filed at 06/30/2018 0807 Gross per 24 hour  Intake 1067.84 ml  Output 500 ml  Net 567.84 ml   Blood pressure 121/83, pulse (!) 111, temperature 97.7 F (  36.5 C), temperature source Axillary, resp. rate (!) 22, height 6' (1.829 m), weight 73 kg, SpO2 94 %. Temp:  [97.7 F (36.5 C)-98.8 F (37.1 C)] 97.7 F (36.5 C) (05/24 1251) Pulse Rate:  [99-127] 111 (05/24 1251) Resp:  [19-22] 22 (05/24 0800) BP: (121-184)/(79-117) 121/83 (05/24 1251) SpO2:  [94 %-99 %] 94 % (05/24 1251)  Physical Exam: General: Alert and awake, oriented x3, not in any acute distress. HEENT: anicteric sclera, EOMI CVS regular rate, normal, no murmurs gallops or rubs Chest: , no wheezing, no respiratory distress, clear to auscultation bilaterally Abdomen: soft non-distended,  Extremities: no edema or deformity noted bilaterally Skin: no rashes Neuro: nonfocal  CBC:    BMET Recent Labs    06/28/18 0744  06/29/18 0418  NA 136 135  K 5.2* 4.9  CL 98 96*  CO2 20* 19*  GLUCOSE 143* 245*  BUN 33* 47*  CREATININE 6.01* 7.97*  CALCIUM 9.8 9.8     Liver Panel  No results for input(s): PROT, ALBUMIN, AST, ALT, ALKPHOS, BILITOT, BILIDIR, IBILI in the last 72 hours.     Sedimentation Rate Recent Labs    06/30/18 1117  ESRSEDRATE 14   C-Reactive Protein Recent Labs    06/30/18 1117  CRP 2.5*    Micro Results: Recent Results (from the past 720 hour(s))  SARS Coronavirus 2 (CEPHEID- Performed in [place] hospital lab), Hosp Order     Status: None   Collection Time: 06/26/18 10:50 PM  Result Value Ref Range Status   SARS Coronavirus 2 NEGATIVE NEGATIVE Final    Comment: (NOTE) If result is NEGATIVE SARS-CoV-2 target nucleic acids are NOT DETECTED. The SARS-CoV-2 RNA is generally detectable in upper and lower  respiratory specimens during the acute phase of infection. The lowest  concentration of SARS-CoV-2 viral copies this assay can detect is 250  copies / mL. A negative result does not preclude SARS-CoV-2 infection  and should not be used as the sole basis for treatment or other  patient management decisions.  A negative result may occur with  improper specimen collection / handling, submission of specimen other  than nasopharyngeal swab, presence of viral mutation(s) within the  areas targeted by this assay, and inadequate number of viral copies  (<250 copies / mL). A negative result must be combined with clinical  observations, patient history, and epidemiological information. If result is POSITIVE SARS-CoV-2 target nucleic acids are DETECTED. The SARS-CoV-2 RNA is generally detectable in upper and lower  respiratory specimens dur ing the acute phase of infection.  Positive  results are indicative of active infection with SARS-CoV-2.  Clinical  correlation with patient history and other diagnostic information is  necessary to determine patient infection status.   Positive results do  not rule out bacterial infection or co-infection with other viruses. If result is PRESUMPTIVE POSTIVE SARS-CoV-2 nucleic acids MAY BE PRESENT.   A presumptive positive result was obtained on the submitted specimen  and confirmed on repeat testing.  While 2019 novel coronavirus  (SARS-CoV-2) nucleic acids may be present in the submitted sample  additional confirmatory testing may be necessary for epidemiological  and / or clinical management purposes  to differentiate between  SARS-CoV-2 and other Sarbecovirus currently known to infect humans.  If clinically indicated additional testing with an alternate test  methodology [phone]) is advised. The SARS-CoV-2 RNA is generally  detectable in upper and lower respiratory sp ecimens during the acute  phase of infection. The expected result is Negative. Fact  Sheet for Patients:  StrictlyIdeas.no Fact Sheet for Healthcare Providers: BankingDealers.co.za This test is not yet approved or cleared by the [place] FDA and has been authorized for detection and/or diagnosis of SARS-CoV-2 by FDA under an Emergency Use Authorization (EUA).  This EUA will remain in effect (meaning this test can be used) for the duration of the COVID-19 declaration under Section 564(b)(1) of the Act, 21 U.S.C. section 360bbb-3(b)(1), unless the authorization is terminated or revoked sooner. Performed at [facility], [place] [address], [place], [place] [address]   Culture, blood (Routine X 2) w Reflex to ID Panel     Status: None (Preliminary result)   Collection Time: 06/27/18 12:52 PM  Result Value Ref Range Status   Specimen Description BLOOD LEFT ANTECUBITAL  Final   Special Requests   Final    BOTTLES DRAWN AEROBIC ONLY Blood Culture adequate volume   Culture   Final    NO GROWTH 3 DAYS Performed at [facility], [place] [address], [place], [place] [address]    Report Status  PENDING  Incomplete  Culture, blood (Routine X 2) w Reflex to ID Panel     Status: None (Preliminary result)   Collection Time: 06/27/18  1:00 PM  Result Value Ref Range Status   Specimen Description BLOOD RIGHT HAND  Final   Special Requests   Final    BOTTLES DRAWN AEROBIC ONLY Blood Culture adequate volume   Culture   Final    NO GROWTH 3 DAYS Performed at [facility], [place] [address], [place], [place] [address]    Report Status PENDING  Incomplete  MRSA PCR Screening     Status: None   Collection Time: 06/27/18  1:06 PM  Result Value Ref Range Status   MRSA by PCR NEGATIVE NEGATIVE Final    Comment:        The GeneXpert MRSA Assay (FDA approved for NASAL specimens only), is one component of a comprehensive MRSA colonization surveillance program. It is not intended to diagnose MRSA infection nor to guide or monitor treatment for MRSA infections. Performed at [facility], [place] [address], [place], [place] [address]   CSF culture with Stat gram stain     Status: None (Preliminary result)   Collection Time: 06/27/18  2:49 PM  Result Value Ref Range Status   Specimen Description CSF  Final   Special Requests NONE  Final   Gram Stain   Final    WBC PRESENT, PREDOMINANTLY PMN NO ORGANISMS SEEN CYTOSPIN SMEAR    Culture   Final    NO GROWTH 3 DAYS Performed at [facility], [place] [address] [place],  [address]    Report Status PENDING  Incomplete    Studies/Results: Mr Brain 53 Contrast  Result Date: 06/29/2018 CLINICAL DATA:  33 y/o M; history of seizure. Episode of loss of consciousness. EXAM: MRI HEAD WITHOUT CONTRAST TECHNIQUE: Axial DWI, coronal DWI, and sagittal T1 weighted sequences were acquired. Patient was unable to tolerate additional sequences. COMPARISON:  06/26/2018 CT of the head. FINDINGS: On the diffusion weighted sequence there is a punctate focus of reduced diffusion within the right anterior temporal lobe white matter (series 5,  image 63). No additional diffusion signal abnormality is evident. Stable ventriculomegaly. Motion degraded sagittal T1 weighted sequence. No gross mass effect is evident. IMPRESSION: Limited examination. Punctate focus of reduced diffusion in the right anterior temporal white matter likely representing a small vessel acute/early subacute  infarction. Pattern is atypical for seizure related activity. These results were called by telephone at the time of interpretation on 06/29/2018 at 3:59 am to Dr. Ignatius Specking , who verbally acknowledged these results. Electronically Signed   By: Kristine Garbe M.D.   On: 06/29/2018 04:02   Dg Abd Portable 1v  Result Date: 06/28/2018 CLINICAL DATA:  Abdominal pain EXAM: PORTABLE ABDOMEN - 1 VIEW COMPARISON:  None. FINDINGS: There is no appreciable bowel dilatation or air-fluid level to suggest bowel obstruction. No free air. There are surgical clips in the right abdomen. IMPRESSION: No bowel obstruction or free air evident. Electronically Signed   By: Lowella Grip III M.D.   On: 06/28/2018 16:44      Assessment/Plan:  INTERVAL HISTORY: Patient seems to be completely lucid   Principal Problem:   Encephalopathy acute Active Problems:   Seizures ([place])   Hypertensive emergency   ESRD (end stage renal disease) on dialysis ([place])   Diabetes type 2, uncontrolled ([place])    Brent Daniels is a 33 y.o. male with end-stage renal disease on hemodialysis, admission with seizures and found to be febrile with encephalopathy.  His lumbar puncture showed CSF pleocytosis and he was initiated on antibacterial and antiviral antibiotics.  The degree of CSF pleocytosis however is not consistent with a bacterial process.  He is dramatically improved since his seizures have resolved.  I do not think he has any evidence of a central nervous system infection though I have continued acyclovir in case somehow this is herpes simplex infection which seems incredibly unlikely   #1 encephalopathy: Likely entirely due to seizures and this could also have caused a CSF pleocytosis  #2 fevers: Not clear what the cause of the fevers were.   I have ordered a chest x-ray and I will order a Panorex as well given his very poor dentition  If his fevers return I would also get a CT of the chest abdomen pelvis to look for occult infection  Re drug use he admitted to [facility] and cocaine but denied IVDU  Dr. Megan Salon will take over the service tomorrow.   LOS: 4 days   Alcide Evener 06/30/2018, 2:18 PM

## 2018-07-01 ENCOUNTER — Inpatient Hospital Stay (HOSPITAL_COMMUNITY): Payer: Medicare Other

## 2018-07-01 ENCOUNTER — Encounter (HOSPITAL_COMMUNITY): Payer: Self-pay | Admitting: *Deleted

## 2018-07-01 DIAGNOSIS — G8929 Other chronic pain: Secondary | ICD-10-CM

## 2018-07-01 DIAGNOSIS — R109 Unspecified abdominal pain: Secondary | ICD-10-CM

## 2018-07-01 LAB — GLUCOSE, CAPILLARY
Glucose-Capillary: 179 mg/dL — ABNORMAL HIGH (ref 70–99)
Glucose-Capillary: 225 mg/dL — ABNORMAL HIGH (ref 70–99)
Glucose-Capillary: 206 mg/dL — ABNORMAL HIGH (ref 70–99)
Glucose-Capillary: 178 mg/dL — ABNORMAL HIGH (ref 70–99)

## 2018-07-01 LAB — HERPES SIMPLEX VIRUS(HSV) DNA BY PCR: HSV 2 DNA: NEGATIVE

## 2018-07-01 LAB — HSV DNA BY PCR (REFERENCE LAB): HSV 1 DNA: NEGATIVE

## 2018-07-01 MED ORDER — ALUM & MAG HYDROXIDE-SIMETH 200-200-20 MG/5ML PO SUSP
30.0000 mL | Freq: Once | ORAL | Status: AC
  Administered 2018-07-01: 01:00:00 30 mL via ORAL
  Filled 2018-07-01: qty 30

## 2018-07-01 MED ORDER — HYOSCYAMINE SULFATE 0.125 MG SL SUBL
0.2500 mg | SUBLINGUAL_TABLET | Freq: Once | SUBLINGUAL | Status: AC
  Administered 2018-07-01: 0.25 mg via SUBLINGUAL
  Filled 2018-07-01: qty 2

## 2018-07-01 MED ORDER — ONDANSETRON HCL 4 MG/2ML IJ SOLN
4.0000 mg | Freq: Four times a day (QID) | INTRAMUSCULAR | Status: DC
  Administered 2018-07-01 – 2018-07-03 (×6): 4 mg via INTRAVENOUS
  Filled 2018-07-01 (×6): qty 2

## 2018-07-01 MED ORDER — PROMETHAZINE HCL 25 MG/ML IJ SOLN
12.5000 mg | Freq: Three times a day (TID) | INTRAMUSCULAR | Status: DC | PRN
  Administered 2018-07-01: 17:00:00 12.5 mg via INTRAVENOUS
  Filled 2018-07-01: qty 1

## 2018-07-01 MED ORDER — MORPHINE SULFATE (PF) 2 MG/ML IV SOLN
1.0000 mg | Freq: Once | INTRAVENOUS | Status: AC
  Administered 2018-07-01: 22:00:00 1 mg via INTRAVENOUS
  Filled 2018-07-01: qty 1

## 2018-07-01 MED ORDER — LABETALOL HCL 200 MG PO TABS
300.0000 mg | ORAL_TABLET | Freq: Two times a day (BID) | ORAL | Status: DC
  Administered 2018-07-01 – 2018-07-02 (×3): 300 mg via ORAL
  Filled 2018-07-01 (×4): qty 1

## 2018-07-01 MED ORDER — PROMETHAZINE HCL 25 MG/ML IJ SOLN: 25.0000 mg | Freq: Three times a day (TID) | INTRAMUSCULAR | Status: DC | PRN

## 2018-07-01 NOTE — Progress Notes (Signed)
Gave patient's meds around [phone]. Patient complaining of severe nausea at the time and stated unable to take oral pills. Gave pt Zofran PRN per orders to relieve nausea and informed would recheck to see if able to give oral meds.  Pt rechecked but still stating unable to tolerate oral meds per severe nausea. DBP elevated but SBP within range. MD updated to situation. Will continue to monitor pt's B/P.

## 2018-07-01 NOTE — Progress Notes (Signed)
Patient ID: Dario Ave, male   DOB: Oct 13, 1985, 33 y.o.   MRN: [id number]  [facility] Progress Note    Subjective:   Feels better   Objective:   BP (!) 167/105 (BP Location: Left Arm)   Pulse 94   Temp 98.2 F (36.8 C) (Oral)   Resp 16   Ht 6' (1.829 m)   Wt 74.9 kg   SpO2 97%   BMI 22.39 kg/m   Intake/Output: I/O last 3 completed shifts: In: 623.8 [I.V.:523.8; IV Piggyback:100] Out: 750 [Urine:500; Emesis/NG output:250]   Intake/Output this shift:  No intake/output data recorded. Weight change: -0.6 kg  Physical Exam: Gen: NAD CVS: no rub Resp: cta Abd: benign Ext: no edema, RIJ TDC  Labs: BMET Recent Labs  Lab 06/26/18 2200 06/26/18 2320 06/27/18 0509 06/27/18 1550 06/28/18 0744 06/29/18 0418  NA 140 139 135 143 136 135  K 4.7 4.5 6.6* 5.1 5.2* 4.9  CL 105  --  99 106 98 96*  CO2 22  --  19* 22 20* 19*  GLUCOSE 68*  --  470* 80 143* 245*  BUN 58*  --  58* 66* 33* 47*  CREATININE 7.73*  --  7.75* 9.00* 6.01* 7.97*  ALBUMIN 4.3  --   --   --   --   --   CALCIUM 9.9  --  9.3 9.6 9.8 9.8  PHOS  --   --  6.5*  --   --   --    CBC Recent Labs  Lab 06/26/18 2200 06/26/18 2320 06/27/18 0509 06/28/18 0744 06/29/18 0418  WBC 12.6*  --  21.2* 15.5* 12.8*  NEUTROABS 8.2*  --   --   --   --   HGB 12.2* 11.9* 12.3* 13.2 13.6  HCT 38.0* 35.0* 38.3* 40.4 41.0  MCV 93.8  --  94.6 91.8 91.1  PLT 284  --  245 276 269    @IMGRELPRIORS @ Medications:    . amLODipine  10 mg Oral Daily  . buprenorphine-naloxone  1 tablet Sublingual TID  . chlorhexidine  15 mL Mouth Rinse BID  . Chlorhexidine Gluconate Cloth  6 each Topical Q0600  . Chlorhexidine Gluconate Cloth  6 each Topical Q0600  . folic acid  1 mg Intravenous Daily  . heparin  5,000 Units Subcutaneous Q8H  . hyoscyamine  0.25 mg Sublingual Once  . insulin aspart  0-5 Units Subcutaneous QHS  . insulin aspart  0-9 Units Subcutaneous TID WC  . insulin aspart  3 Units Subcutaneous  TID WC  . insulin detemir  15 Units Subcutaneous Q12H  . labetalol  300 mg Oral BID  . mouth rinse  15 mL Mouth Rinse q12n4p  . ondansetron (ZOFRAN) IV  4 mg Intravenous Q6H  . pantoprazole (PROTONIX) IV  40 mg Intravenous Q12H   Dialysis Prescription:  [facility], TTS edw 77kg, time 4 hrs, BFR 350, DFR 600, 2K/2.5 Ca, RIJ TDC, heparin 800 units bolus, 400units/hr  Assessment/ Plan:   1. Seizure- mental status is improving.  Off of acyclovir. 2. ESRD continue with HD qTTS 3. Anemia: no ESA due to Hgb >12 4. 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 Nutrition: renal diet 6. Hypertension: elevated, follow with UF at HD. 7. Noncompliance due to polysubstance abuse  Donetta Potts, MD [place] Pager [phone] 07/01/2018, 3:00 PM

## 2018-07-01 NOTE — Progress Notes (Signed)
[facility]  Date of Admission:  06/26/2018   Antibiotics discontinued on 05/23         ASSESSMENT: Likely acute metabolic encephalopathy 2/2 to ESRD with resulting seizure. Unlikely to be infectious given evaluation to date. Initial fever possibly 2/2 seizure, uncertain what caused the second. CSF labs inconsistent with CSF infection. Elevated WBC count can be seen post ictally and trending downward. Clinically improving off of antibiotics.  PLAN: 1. HSV PCR of cerebrospinal fluid negative- Discontinued acyclovir 2. Please contact ID again if additional concerns arise.   Principal Problem:   Encephalopathy acute Active Problems:   Seizures ([place])   Hypertensive emergency   ESRD (end stage renal disease) on dialysis ([place])   Diabetes type 2, uncontrolled ([place])   Acute respiratory failure ([facility])   Poor dentition   FUO (fever of unknown origin)   Scheduled Meds: . amLODipine  10 mg Oral Daily  . buprenorphine-naloxone  1 tablet Sublingual TID  . chlorhexidine  15 mL Mouth Rinse BID  . Chlorhexidine Gluconate Cloth  6 each Topical Q0600  . Chlorhexidine Gluconate Cloth  6 each Topical Q0600  . folic acid  1 mg Intravenous Daily  . heparin  5,000 Units Subcutaneous Q8H  . hyoscyamine  0.25 mg Sublingual Once  . insulin aspart  0-5 Units Subcutaneous QHS  . insulin aspart  0-9 Units Subcutaneous TID WC  . insulin aspart  3 Units Subcutaneous TID WC  . insulin detemir  15 Units Subcutaneous Q12H  . labetalol  200 mg Oral BID  . mouth rinse  15 mL Mouth Rinse q12n4p  . pantoprazole (PROTONIX) IV  40 mg Intravenous Q12H   Continuous Infusions: . sodium chloride    . sodium chloride Stopped (06/30/18 0744)  . acyclovir 375 mg (06/30/18 2158)   PRN Meds:.sodium chloride, sodium chloride, acetaminophen, alum & mag hydroxide-simeth, dextrose, dicyclomine, heparin, hydrALAZINE, hydrOXYzine, labetalol, loperamide, methocarbamol, ondansetron (ZOFRAN)  IV   SUBJECTIVE: The patient stated that he was feeling better overall. He understood that his current evaluation did not reveal an infectious source.   Review of Systems: Review of Systems  Constitutional: Negative for chills, diaphoresis and fever.  HENT: Negative for ear pain and sinus pain.   Eyes: Negative for blurred vision, photophobia and redness.  Respiratory: Negative for cough and shortness of breath.   Cardiovascular: Negative for chest pain and leg swelling.  Gastrointestinal: Positive for abdominal pain (Mild, chronic). Negative for blood in stool, constipation, diarrhea, melena, nausea and vomiting.  Genitourinary: Negative for dysuria, flank pain, hematuria and urgency.  Musculoskeletal: Negative for myalgias.  Neurological: Negative for dizziness and headaches.  Psychiatric/Behavioral: Negative for depression. The patient is not nervous/anxious.     No Known Allergies  OBJECTIVE: Vitals:   07/01/18 0237 07/01/18 0441 07/01/18 0452 07/01/18 0842  BP:  (!) 128/101  (!) 167/105  Pulse:  (!) 101 92 100  Resp:  (!) 25 11 12   Temp:  98 F (36.7 C)  98.2 F (36.8 C)  TempSrc:  Axillary  Oral  SpO2:  97% 95% 98%  Weight: 74.9 kg     Height:       Body mass index is 22.39 kg/m.  Physical Exam Constitutional:      General: He is not in acute distress.    Appearance: He is well-developed. He is not diaphoretic.  Neck:     Musculoskeletal: Normal range of motion.  Cardiovascular:  Rate and Rhythm: Normal rate and regular rhythm.     Heart sounds: No murmur.  Pulmonary:     Effort: Pulmonary effort is normal. No respiratory distress.     Breath sounds: Normal breath sounds. No wheezing.  Abdominal:     General: Bowel sounds are normal. There is no distension.     Palpations: Abdomen is soft.     Tenderness: There is no abdominal tenderness.  Skin:    General: Skin is warm.  Neurological:     Mental Status: He is alert and oriented to person, place, and  time.    Lab Results Lab Results  Component Value Date   WBC 12.8 (H) 06/29/2018   HGB 13.6 06/29/2018   HCT 41.0 06/29/2018   MCV 91.1 06/29/2018   PLT 269 06/29/2018    Lab Results  Component Value Date   CREATININE 7.97 (H) 06/29/2018   BUN 47 (H) 06/29/2018   NA 135 06/29/2018   K 4.9 06/29/2018   CL 96 (L) 06/29/2018   CO2 19 (L) 06/29/2018    Lab Results  Component Value Date   ALT 19 06/26/2018   AST 23 06/26/2018   ALKPHOS 83 06/26/2018   BILITOT 0.7 06/26/2018     Microbiology: Recent Results (from the past 240 hour(s))  SARS Coronavirus 2 (CEPHEID- Performed in [facility] hospital lab), Hosp Order     Status: None   Collection Time: 06/26/18 10:50 PM  Result Value Ref Range Status   SARS Coronavirus 2 NEGATIVE NEGATIVE Final    Comment: (NOTE) If result is NEGATIVE SARS-CoV-2 target nucleic acids are NOT DETECTED. The SARS-CoV-2 RNA is generally detectable in upper and lower  respiratory specimens during the acute phase of infection. The lowest  concentration of SARS-CoV-2 viral copies this assay can detect is 250  copies / mL. A negative result does not preclude SARS-CoV-2 infection  and should not be used as the sole basis for treatment or other  patient management decisions.  A negative result may occur with  improper specimen collection / handling, submission of specimen other  than nasopharyngeal swab, presence of viral mutation(s) within the  areas targeted by this assay, and inadequate number of viral copies  (<250 copies / mL). A negative result must be combined with clinical  observations, patient history, and epidemiological information. If result is POSITIVE SARS-CoV-2 target nucleic acids are DETECTED. The SARS-CoV-2 RNA is generally detectable in upper and lower  respiratory specimens dur ing the acute phase of infection.  Positive  results are indicative of active infection with SARS-CoV-2.  Clinical  correlation with patient history and  other diagnostic information is  necessary to determine patient infection status.  Positive results do  not rule out bacterial infection or co-infection with other viruses. If result is PRESUMPTIVE POSTIVE SARS-CoV-2 nucleic acids MAY BE PRESENT.   A presumptive positive result was obtained on the submitted specimen  and confirmed on repeat testing.  While 2019 novel coronavirus  (SARS-CoV-2) nucleic acids may be present in the submitted sample  additional confirmatory testing may be necessary for epidemiological  and / or clinical management purposes  to differentiate between  SARS-CoV-2 and other Sarbecovirus currently known to infect humans.  If clinically indicated additional testing with an alternate test  methodology [id number]) is advised. The SARS-CoV-2 RNA is generally  detectable in upper and lower respiratory sp ecimens during the acute  phase of infection. The expected result is Negative. Fact Sheet for Patients:  StrictlyIdeas.no Fact  Sheet for Healthcare Providers: BankingDealers.co.za This test is not yet approved or cleared by the [place] and has been authorized for detection and/or diagnosis of SARS-CoV-2 by FDA under an Emergency Use Authorization (EUA).  This EUA will remain in effect (meaning this test can be used) for the duration of the COVID-19 declaration under Section 564(b)(1) of the Act, 21 U.S.C. section 360bbb-3(b)(1), unless the authorization is terminated or revoked sooner. Performed at [facility], [place] [address] [place], [place] [address]   Culture, blood (Routine X 2) w Reflex to ID Panel     Status: None (Preliminary result)   Collection Time: 06/27/18 12:52 PM  Result Value Ref Range Status   Specimen Description BLOOD LEFT ANTECUBITAL  Final   Special Requests   Final    BOTTLES DRAWN AEROBIC ONLY Blood Culture adequate volume   Culture   Final    NO GROWTH 4 DAYS Performed at  [facility], [place] [address], [place], [place] [address]    Report Status PENDING  Incomplete  Culture, blood (Routine X 2) w Reflex to ID Panel     Status: None (Preliminary result)   Collection Time: 06/27/18  1:00 PM  Result Value Ref Range Status   Specimen Description BLOOD RIGHT HAND  Final   Special Requests   Final    BOTTLES DRAWN AEROBIC ONLY Blood Culture adequate volume   Culture   Final    NO GROWTH 4 DAYS Performed at [facility], [place] [address], [place], [place] [address]    Report Status PENDING  Incomplete  MRSA PCR Screening     Status: None   Collection Time: 06/27/18  1:06 PM  Result Value Ref Range Status   MRSA by PCR NEGATIVE NEGATIVE Final    Comment:        The GeneXpert MRSA Assay (FDA approved for NASAL specimens only), is one component of a comprehensive MRSA colonization surveillance program. It is not intended to diagnose MRSA infection nor to guide or monitor treatment for MRSA infections. Performed at [facility], [place] [address], [place], [place] [address]   CSF culture with Stat gram stain     Status: None   Collection Time: 06/27/18  2:49 PM  Result Value Ref Range Status   Specimen Description CSF  Final   Special Requests NONE  Final   Gram Stain   Final    WBC PRESENT, PREDOMINANTLY PMN NO ORGANISMS SEEN CYTOSPIN SMEAR    Culture   Final    NO GROWTH 3 DAYS Performed at [facility], [place] [address], [place], [place] [address]    Report Status 06/30/2018 FINAL  Final  Fungus Culture With Stain     Status: None (Preliminary result)   Collection Time: 06/27/18  2:49 PM  Result Value Ref Range Status   Fungus Stain Final report  Final    Comment: (NOTE) Performed At: [facility] [place], [place] [id number] Rush Farmer MD [id number]    Fungus (Mycology) Culture PENDING  Incomplete   Fungal Source CSF  Final    Comment: Performed at [facility], [place] [address],  [place], [place] [address]  Fungus Culture Result     Status: None   Collection Time: 06/27/18  2:49 PM  Result Value Ref Range Status   Result 1 Comment  Final    Comment: (NOTE) KOH/Calcofluor preparation:  no fungus observed. Performed At: [facility]  [facility] [address] [place], [place] [id number] Rush Farmer MD [id number]     Kathi Ludwig, [facility] [phone] pager   [phone] cell 07/01/2018, 9:38 AM

## 2018-07-01 NOTE — Care Management Important Message (Signed)
Important Message  Patient Details  Name: Brent Daniels MRN: [id number] Date of Birth: 05/23/1985   Medicare Important Message Given:  Yes    Manali Mcelmurry 07/01/2018, 3:10 PM

## 2018-07-01 NOTE — Progress Notes (Addendum)
PROGRESS NOTE   Brent Daniels  JHE:[id number]    DOB: Jun 26, 1985    DOA: 06/26/2018  PCP: Glenda Chroman, MD   I have briefly reviewed patients previous medical records in [facility].  Patient has 2 medical records that have yet to be reconciled.  One is the current medical record and the other one has MRN number [id number].    Brief Narrative:  CCM transfer to [facility] 64/47: 33 year old male with PMH of ESRD on HD, noncompliant, HTN, DM 2, seizures but not on AEDs,Opiate dependence, substance abuse, on day of admission suddenly collapsed in front of his friend and had witnessed tonic-clonic seizure activity.  EMS noted normal BS, hypertensive (SBP's in the 200s) on arrival to ED, postictal, very confused and agitated, intubated in ED for CT of head and then successfully extubated shortly thereafter.  He was admitted to ICU by CCM.  Initial concern was for seizures.  Subsequently spiked a high fever with leukocytosis without clear source, underwent spinal tap and started on broad-spectrum antibiotics/acyclovir for possible meningoencephalitis.  Neurology suspects TME likely due to combination of cocaine induced vasospasm, hypertensive emergency/PRES and less likely due to CNS infection.  Nephrology on board for HD needs.  ID consulted 5/23.  Mental status changes resolved.  Hospital course complicated by nonbloody emesis since 5/23 night, possibly diabetic gastroparesis.   Assessment & Plan:   Principal Problem:   Encephalopathy acute Active Problems:   Seizures ([place])   Hypertensive emergency   ESRD (end stage renal disease) on dialysis ([place])   Diabetes type 2, uncontrolled ([place])   Acute respiratory failure (H[facility])   Poor dentition   FUO (fever of unknown origin)   Toxic metabolic encephalopathy  Head and C-spine 5/20 without acute findings.  MRI brain 5/23: Limited exam due to lack of cooperation.  Reported findings suggestive of small vessel acute/early subacute infarction in the  right anterior temporal white matter.  However I discussed this finding with Dr. Rory Percy who indicates that this is not a stroke or a significant finding.  EEG 5/21: Abnormal, generalized background slowing with superimposed beta activity.  No epileptiform activity noted.  UDS 5/21: Positive for benzodiazepines and cocaine.  BAL <10.  CSF 5/21: WBC 113, segmented neutrophils 95.  Glucose 133 and CSF protein 34.  CSF culture: Negative to date.  Blood cultures x2: Negative to date.  Urine pneumococcal and Legionella antigen negative.  Acute hepatitis panel negative, HIV screen negative x2, RPR negative and cryptococcal antigen negative.  SARS coronavirus 2: Negative.  Initial concern on admission was for seizures and postictal state.  He had been loaded with IV Keppra.  However after extensive work-up and as discussed with Dr. Rory Percy, no CVA, less likely to be seizures, possible gabapentin-induced myoclonus in the setting of renal insufficiency, concern for meningoencephalitis (but as discussed with Dr. Tommy Medal this does not look like bacterial infection and stopped antibiotics but continued IV acyclovir pending HSV results), hypertensive emergency/PRES and uremia.  No maintenance AEDs. Blood pressure and CBG better controlled. Continue HD per nephrology.   Delirium precautions. Minimize sedatives/opioids.  Continue folate and completed IV thiamine  Acute encephalopathy resolved and stable.  Noted telemetry sitter this morning, unclear reason, discussed with RN and suggested discontinuing telemetry sitter and monitoring.  CSF HSV 1 and 2 PCR negative.  Discussed with Dr. Megan Salon and discontinued IV Acyclovir.  Fever/possible meningoencephalitis  Extensive evaluation as noted above.  All antibiotics discontinued 5/23 as per ID recommendations.  Was on  IV acyclovir pending HSV results.  Chest x-ray negative.  Panorex negative.  Unable to do TTE because patient had an episode of  vomiting.  No fever since 5/22.  CSF HSV 1 and 2 PCR finally came back negative.  I discussed with Dr. Megan Salon, ID who recommended discontinuing acyclovir, if no further fever or mental status changes, recommends no need for further evaluation of fevers which have now resolved.  His CSF findings and fever may have been related to seizures.  Hypertensive emergency  Weaned off Cleviprex 5/21.  Labetalol 200 mg twice daily added 5/23.  Volume management across HD but does not appear volume overloaded.  Also on PRN IV hydralazine and labetalol  Resumed home amlodipine 10 mg daily.  Discontinued HCTZ due to ESRD on HD.  Home carvedilol not to be resumed at discharge due to active cocaine use history.  BP better still elevated.  Increased labetalol to 300 mg twice daily.  ESRD on HD  Management per nephrology.  Reportedly noncompliant as outpatient and only been to HD 3 times in the month of May.  He undergoes outpatient dialysis at [facility] in [place] on TTS schedule.  Dialyzed through right IJ HD catheter.  Last HD was 5/23 and next HD will be on 5/26  Will check with Nephrology to see if it is okay to give brief Reglan if patient has intractable nausea or vomiting.  Hyperkalemia  Resolved after HD.  Type II DM with renal complications  Z6X 8.8 suggests poor outpatient control.  Reports checking CBGs multiple times at home but this history is not reliable.  Claims that he takes Levemir 20 units twice daily and SSI at home.  Increased Levemir to 15 units twice daily, continue NovoLog sensitive SSI and added NovoLog mealtime 3 units.  Monitor closely and adjust insulins as needed.  CBGs better controlled.  Substance abuse/cocaine  Counseling when able.  Chronic pain  Patient reports that he follows with Dr. Lonell Grandchild in [place] who prescribes his Suboxone.  Resumed Suboxone 5/24.  Pain seems to be better controlled.  Patient reports chronic abdominal pain.   Abdominal pain and nonbloody emesis  KUB recently without acute findings.  DD: Diabetic gastroparesis which patient states that he has had in the past, opioid withdrawal-now less likely since Suboxone has been resumed.  Continue IV PPI 40 mg every 12 hourly and Maalox as needed.    Abdominal pain is better but still having nausea and vomiting.  Changed IV Zofran to 4 mg every 6 hourly scheduled, added PRN IV Phenergan.  May consider Reglan.  Downgraded to full liquid diet.  Patient has 2 medical records that have yet to be reconciled.  Upon review of his other medical record, he has been released from [facility]/Dr. Pilar Grammes practice due to no-show's.  He was then trying to establish with another Gastroenterologist in 2018 but not sure if this happened.  There is documented history of diabetic gastroparesis, severe reflux esophagitis, candida esophagitis, peptic ulcer disease chronic abdominal pain, recurrent vomiting.  He was supposed to have EGD sometime in 2017 which he declined.  Last EGD was from 2015.  If patient does not improve with above conservative measures, may have to consult GI for further evaluation.  DVT prophylaxis: Subcutaneous heparin Code Status: Full Family Communication: None at bedside Disposition: DC home pending clinical improvement   Consultants:  PCCM-signed off Nephrology Neurology ID  Procedures:  Intubated/extubated in ED. LP 5/21 HD  Antimicrobials:  IV acyclovir 5/21 >  5/25 IV ceftriaxone 5/21-5/22, discontinued 5/23 IV vancomycin x1 on 5/21, discontinued 5/23   Subjective: Reports chronic abdominal pain due to "gastroparesis".  Has seen Dr. [name], GI in [place] in the past.  Reports that he still had some vomiting overnight but not as bad as yesterday.  Did not complain of abdominal pain at this time.  Some headache.  ROS: As above, otherwise negative.  Objective:  Vitals:   07/01/18 0237 07/01/18 0441  07/01/18 0452 07/01/18 0842  BP:  (!) 128/101  (!) 167/105  Pulse:  (!) 101 92 100  Resp:  (!) 25 11 12   Temp:  98 F (36.7 C)  98.2 F (36.8 C)  TempSrc:  Axillary  Oral  SpO2:  97% 95% 98%  Weight: 74.9 kg     Height:        Examination:  General exam: Young male, moderately built and nourished, sitting up comfortably in bed without distress.  Oral mucosa moist. Respiratory system: Clear to auscultation. Respiratory effort normal.  Right anterior chest IJ HD catheter.  Stable. Cardiovascular system: S1 & S2 heard, RRR. No JVD, murmurs, rubs, gallops or clicks. No pedal edema.  Telemetry personally reviewed: Sinus rhythm. Gastrointestinal system: Abdomen is nondistended, soft and nontender. No organomegaly or masses felt. Normal bowel sounds heard. Central nervous system: Alert and oriented x4. No focal neurological deficits. Extremities: Symmetric 5 x 5 power. Skin: No rashes, lesions or ulcers Psychiatry: Judgement and insight intact. Mood & affect appropriate.     Data Reviewed: I have personally reviewed following labs and imaging studies  CBC: Recent Labs  Lab 06/26/18 2200 06/26/18 2320 06/27/18 0509 06/28/18 0744 06/29/18 0418  WBC 12.6*  --  21.2* 15.5* 12.8*  NEUTROABS 8.2*  --   --   --   --   HGB 12.2* 11.9* 12.3* 13.2 13.6  HCT 38.0* 35.0* 38.3* 40.4 41.0  MCV 93.8  --  94.6 91.8 91.1  PLT 284  --  245 276 213   Basic Metabolic Panel: Recent Labs  Lab 06/26/18 2200 06/26/18 2320 06/27/18 0509 06/27/18 1550 06/28/18 0744 06/29/18 0418  NA 140 139 135 143 136 135  K 4.7 4.5 6.6* 5.1 5.2* 4.9  CL 105  --  99 106 98 96*  CO2 22  --  19* 22 20* 19*  GLUCOSE 68*  --  470* 80 143* 245*  BUN 58*  --  58* 66* 33* 47*  CREATININE 7.73*  --  7.75* 9.00* 6.01* 7.97*  CALCIUM 9.9  --  9.3 9.6 9.8 9.8  MG  --   --  2.0  --   --   --   PHOS  --   --  6.5*  --   --   --    Liver Function Tests: Recent Labs  Lab 06/26/18 2200  AST 23  ALT 19  ALKPHOS  83  BILITOT 0.7  PROT 7.5  ALBUMIN 4.3   Coagulation Profile: Recent Labs  Lab 06/26/18 2200  INR 1.1   Cardiac Enzymes: Recent Labs  Lab 06/26/18 2200 06/27/18 0509  CKTOTAL 201 221   HbA1C: No results for input(s): HGBA1C in the last 72 hours. CBG: Recent Labs  Lab 06/30/18 0730 06/30/18 1217 06/30/18 1706 06/30/18 2114 07/01/18 0645  GLUCAP 392* 243* 139* 90 179*    Recent Results (from the past 240 hour(s))  SARS Coronavirus 2 (CEPHEID- Performed in [place] hospital lab), Hosp Order     Status: None  Collection Time: 06/26/18 10:50 PM  Result Value Ref Range Status   SARS Coronavirus 2 NEGATIVE NEGATIVE Final    Comment: (NOTE) If result is NEGATIVE SARS-CoV-2 target nucleic acids are NOT DETECTED. The SARS-CoV-2 RNA is generally detectable in upper and lower  respiratory specimens during the acute phase of infection. The lowest  concentration of SARS-CoV-2 viral copies this assay can detect is 250  copies / mL. A negative result does not preclude SARS-CoV-2 infection  and should not be used as the sole basis for treatment or other  patient management decisions.  A negative result may occur with  improper specimen collection / handling, submission of specimen other  than nasopharyngeal swab, presence of viral mutation(s) within the  areas targeted by this assay, and inadequate number of viral copies  (<250 copies / mL). A negative result must be combined with clinical  observations, patient history, and epidemiological information. If result is POSITIVE SARS-CoV-2 target nucleic acids are DETECTED. The SARS-CoV-2 RNA is generally detectable in upper and lower  respiratory specimens dur ing the acute phase of infection.  Positive  results are indicative of active infection with SARS-CoV-2.  Clinical  correlation with patient history and other diagnostic information is  necessary to determine patient infection status.  Positive results do  not rule out  bacterial infection or co-infection with other viruses. If result is PRESUMPTIVE POSTIVE SARS-CoV-2 nucleic acids MAY BE PRESENT.   A presumptive positive result was obtained on the submitted specimen  and confirmed on repeat testing.  While 2019 novel coronavirus  (SARS-CoV-2) nucleic acids may be present in the submitted sample  additional confirmatory testing may be necessary for epidemiological  and / or clinical management purposes  to differentiate between  SARS-CoV-2 and other Sarbecovirus currently known to infect humans.  If clinically indicated additional testing with an alternate test  methodology [phone]) is advised. The SARS-CoV-2 RNA is generally  detectable in upper and lower respiratory sp ecimens during the acute  phase of infection. The expected result is Negative. Fact Sheet for Patients:  StrictlyIdeas.no Fact Sheet for Healthcare Providers: BankingDealers.co.za This test is not yet approved or cleared by the [place] FDA and has been authorized for detection and/or diagnosis of SARS-CoV-2 by FDA under an Emergency Use Authorization (EUA).  This EUA will remain in effect (meaning this test can be used) for the duration of the COVID-19 declaration under Section 564(b)(1) of the Act, 21 U.S.C. section 360bbb-3(b)(1), unless the authorization is terminated or revoked sooner. Performed at [facility], [place] [address], [place], [place] [address]   Culture, blood (Routine X 2) w Reflex to ID Panel     Status: None (Preliminary result)   Collection Time: 06/27/18 12:52 PM  Result Value Ref Range Status   Specimen Description BLOOD LEFT ANTECUBITAL  Final   Special Requests   Final    BOTTLES DRAWN AEROBIC ONLY Blood Culture adequate volume   Culture   Final    NO GROWTH 4 DAYS Performed at [facility], [place] [address], [place],  [address]    Report Status PENDING  Incomplete  Culture, blood  (Routine X 2) w Reflex to ID Panel     Status: None (Preliminary result)   Collection Time: 06/27/18  1:00 PM  Result Value Ref Range Status   Specimen Description BLOOD RIGHT HAND  Final   Special Requests   Final    BOTTLES DRAWN AEROBIC ONLY Blood Culture adequate volume  Culture   Final    NO GROWTH 4 DAYS Performed at [facility], [place] [address], [place], [place] [address]    Report Status PENDING  Incomplete  MRSA PCR Screening     Status: None   Collection Time: 06/27/18  1:06 PM  Result Value Ref Range Status   MRSA by PCR NEGATIVE NEGATIVE Final    Comment:        The GeneXpert MRSA Assay (FDA approved for NASAL specimens only), is one component of a comprehensive MRSA colonization surveillance program. It is not intended to diagnose MRSA infection nor to guide or monitor treatment for MRSA infections. Performed at [facility], [place] [address] [place], [place] [address]   CSF culture with Stat gram stain     Status: None   Collection Time: 06/27/18  2:49 PM  Result Value Ref Range Status   Specimen Description CSF  Final   Special Requests NONE  Final   Gram Stain   Final    WBC PRESENT, PREDOMINANTLY PMN NO ORGANISMS SEEN CYTOSPIN SMEAR    Culture   Final    NO GROWTH 3 DAYS Performed at [facility], [place] [address], [place], [place] [address]    Report Status 06/30/2018 FINAL  Final  Fungus Culture With Stain     Status: None (Preliminary result)   Collection Time: 06/27/18  2:49 PM  Result Value Ref Range Status   Fungus Stain Final report  Final    Comment: (NOTE) Performed At: [facility] [place], [place] [id number] Rush Farmer MD GY:[id number]    Fungus (Mycology) Culture PENDING  Incomplete   Fungal Source CSF  Final    Comment: Performed at [facility], [place] [address], [place], [place] [address]  Fungus Culture Result     Status: None   Collection Time: 06/27/18  2:49 PM  Result Value Ref  Range Status   Result 1 Comment  Final    Comment: (NOTE) KOH/Calcofluor preparation:  no fungus observed. Performed At: [facility] [place], [place] [id number] Rush Farmer MD SV:[id number]          Radiology Studies: Dg Orthopantogram  Result Date: 06/30/2018 CLINICAL DATA:  Fever.  Possible infection. EXAM: ORTHOPANTOGRAM/PANORAMIC COMPARISON:  None. FINDINGS: No evidence for mandibular fracture. Temporomandibular joints appear located. Maxillary sinuses are clear. No evidence for period a coal lucency associated with any of the lower teeth. No definite lucency seen around the roots of the upper teeth although superimposed bony anatomy hinders assessment. IMPRESSION: No definite findings to suggest periapical abscess. Electronically Signed   By: Misty Stanley M.D.   On: 06/30/2018 14:53        Scheduled Meds: . amLODipine  10 mg Oral Daily  . buprenorphine-naloxone  1 tablet Sublingual TID  . chlorhexidine  15 mL Mouth Rinse BID  . Chlorhexidine Gluconate Cloth  6 each Topical Q0600  . Chlorhexidine Gluconate Cloth  6 each Topical Q0600  . folic acid  1 mg Intravenous Daily  . heparin  5,000 Units Subcutaneous Q8H  . hyoscyamine  0.25 mg Sublingual Once  . insulin aspart  0-5 Units Subcutaneous QHS  . insulin aspart  0-9 Units Subcutaneous TID WC  . insulin aspart  3 Units Subcutaneous TID WC  . insulin detemir  15 Units Subcutaneous Q12H  . labetalol  200 mg Oral BID  . mouth rinse  15 mL Mouth Rinse q12n4p  .  pantoprazole (PROTONIX) IV  40 mg Intravenous Q12H   Continuous Infusions: . sodium chloride    . sodium chloride Stopped (06/30/18 0744)  . acyclovir 375 mg (06/30/18 2158)     LOS: 5 days     Vernell Leep, MD, FACP, [facility]. [facility]  To contact the attending provider between 7A-7P or the covering provider during after hours 7P-7A, please log into the web site [email or web address] and access using universal [place]  password for that web site. If you do not have the password, please call the hospital operator.  07/01/2018, 9:33 AM

## 2018-07-01 NOTE — CV Procedure (Signed)
Echocardiogram not completed per patients request. I asked if we could do the echo he replied "lets do it later". I  Informed the patient we wouldn't be able to come back until tomorrow. He was fine with that.  Darlina Sicilian RDCS

## 2018-07-02 ENCOUNTER — Inpatient Hospital Stay (HOSPITAL_COMMUNITY): Payer: Medicare Other

## 2018-07-02 DIAGNOSIS — R112 Nausea with vomiting, unspecified: Secondary | ICD-10-CM

## 2018-07-02 DIAGNOSIS — R1110 Vomiting, unspecified: Secondary | ICD-10-CM

## 2018-07-02 DIAGNOSIS — E1143 Type 2 diabetes mellitus with diabetic autonomic (poly)neuropathy: Secondary | ICD-10-CM

## 2018-07-02 DIAGNOSIS — K3184 Gastroparesis: Secondary | ICD-10-CM

## 2018-07-02 DIAGNOSIS — R509 Fever, unspecified: Secondary | ICD-10-CM

## 2018-07-02 LAB — RENAL FUNCTION PANEL
Albumin: 4 g/dL (ref 3.5–5.0)
Anion gap: 15 (ref 5–15)
BUN: 55 mg/dL — ABNORMAL HIGH (ref 6–20)
CO2: 34 mmol/L — ABNORMAL HIGH (ref 22–32)
Calcium: 10.2 mg/dL (ref 8.9–10.3)
Chloride: 85 mmol/L — ABNORMAL LOW (ref 98–111)
Creatinine, Ser: 9.23 mg/dL — ABNORMAL HIGH (ref 0.61–1.24)
GFR calc Af Amer: 8 mL/min — ABNORMAL LOW (ref >60)
GFR calc non Af Amer: 7 mL/min — ABNORMAL LOW (ref >60)
Glucose, Bld: 93 mg/dL (ref 70–99)
Phosphorus: 8.9 mg/dL — ABNORMAL HIGH (ref 2.5–4.6)
Potassium: 3.1 mmol/L — ABNORMAL LOW (ref 3.5–5.1)
Sodium: 134 mmol/L — ABNORMAL LOW (ref 135–145)

## 2018-07-02 LAB — CBC
HCT: 42.9 % (ref 39.0–52.0)
Hemoglobin: 14 g/dL (ref 13.0–17.0)
MCH: 29.7 pg (ref 26.0–34.0)
MCHC: 32.6 g/dL (ref 30.0–36.0)
MCV: 91.1 fL (ref 80.0–100.0)
Platelets: 330 10*3/uL (ref 150–400)
RBC: 4.71 MIL/uL (ref 4.22–5.81)
RDW: 15.9 % — ABNORMAL HIGH (ref 11.5–15.5)
WBC: 11.8 10*3/uL — ABNORMAL HIGH (ref 4.0–10.5)
nRBC: 0 % (ref 0.0–0.2)

## 2018-07-02 LAB — CULTURE, BLOOD (ROUTINE X 2)
Culture: NO GROWTH
Culture: NO GROWTH
Special Requests: ADEQUATE
Special Requests: ADEQUATE

## 2018-07-02 LAB — GLUCOSE, CAPILLARY
Glucose-Capillary: 97 mg/dL (ref 70–99)
Glucose-Capillary: 131 mg/dL — ABNORMAL HIGH (ref 70–99)
Glucose-Capillary: 360 mg/dL — ABNORMAL HIGH (ref 70–99)
Glucose-Capillary: 449 mg/dL — ABNORMAL HIGH (ref 70–99)

## 2018-07-02 LAB — HEPATITIS C ANTIBODY (REFLEX): HCV Ab: <0.1 s/co ratio (ref 0.0–0.9)

## 2018-07-02 LAB — ECHOCARDIOGRAM COMPLETE
Height: 72 in
Weight: 2747.81 oz

## 2018-07-02 LAB — HCV COMMENT:

## 2018-07-02 MED ORDER — METOCLOPRAMIDE HCL 5 MG/ML IJ SOLN
10.0000 mg | Freq: Four times a day (QID) | INTRAMUSCULAR | Status: DC
  Administered 2018-07-02 – 2018-07-03 (×3): 10 mg via INTRAVENOUS
  Filled 2018-07-02 (×3): qty 2

## 2018-07-02 MED ORDER — LORAZEPAM 2 MG/ML IJ SOLN
1.0000 mg | Freq: Once | INTRAMUSCULAR | Status: AC
  Administered 2018-07-02: 02:00:00 1 mg via INTRAVENOUS
  Filled 2018-07-02: qty 1

## 2018-07-02 MED ORDER — INSULIN ASPART 100 UNIT/ML ~~LOC~~ SOLN: 0.0000 [IU] | Freq: Three times a day (TID) | SUBCUTANEOUS | Status: DC

## 2018-07-02 MED ORDER — BISACODYL 10 MG RE SUPP: 10.0000 mg | Freq: Every day | RECTAL | Status: DC | PRN

## 2018-07-02 MED ORDER — SODIUM CHLORIDE 0.9 % IV SOLN
INTRAVENOUS | Status: AC
  Administered 2018-07-02: 17:00:00 via INTRAVENOUS

## 2018-07-02 MED ORDER — HEPARIN SODIUM (PORCINE) 1000 UNIT/ML IJ SOLN
INTRAMUSCULAR | Status: AC
  Administered 2018-07-02: 1000 [IU]
  Filled 2018-07-02: qty 4

## 2018-07-02 MED ORDER — INSULIN ASPART 100 UNIT/ML ~~LOC~~ SOLN
0.0000 [IU] | SUBCUTANEOUS | Status: DC
  Administered 2018-07-02: 19:00:00 7 [IU] via SUBCUTANEOUS

## 2018-07-02 MED ORDER — INSULIN ASPART 100 UNIT/ML ~~LOC~~ SOLN
0.0000 [IU] | Freq: Every day | SUBCUTANEOUS | Status: DC
  Administered 2018-07-02: 23:00:00 4 [IU] via SUBCUTANEOUS

## 2018-07-02 MED ORDER — INSULIN DETEMIR 100 UNIT/ML ~~LOC~~ SOLN
10.0000 [IU] | Freq: Two times a day (BID) | SUBCUTANEOUS | Status: DC
  Administered 2018-07-02 – 2018-07-03 (×3): 10 [IU] via SUBCUTANEOUS
  Filled 2018-07-02 (×4): qty 0.1

## 2018-07-02 NOTE — Consult Note (Addendum)
Consultation  Referring Provider: [facility] hospitalist/Dr. Truddie Crumble primary Care Physician:  Glenda Chroman, MD Primary Gastroenterologist: None/unassigned-discharged from [facility] Reason for Consultation: Intractable nausea and vomiting  HPI: Brent Daniels is a 33 y.o. male, who we are asked to evaluate for intractable nausea and vomiting onset 5/24 to 07/01/2018.  Patient was admitted on 06/26/2018 after he lapsed and was noted to have seizure activity.  Noted to be very hypertensive with systolic blood pressure 833 per EMS.  He was confused and agitated.  Patient has history of end-stage renal disease, on hemodialysis, insulin-dependent diabetes mellitus, seizure disorder, hypertension, and noncompliance.  Appendectomy has history of substance abuse and opioid dependence.  He had been on Suboxone as an outpatient. After admission he spiked a temp and was noted to have leukocytosis underwent spinal tap due to concerns of meningioma encephalitis.  He is also been evaluated by neurology and was felt to have had a toxic metabolic encephalopathy likely secondary to cocaine induced vasospasm.  He underwent extensive infectious work and ID consultation.  Infectious work-up has been negative, viral's and antibiotic biotics were stopped as of 07/01/2018.  At this point his confusion is completely resolved.  He had apparently been eating okay prior to 06/30/2018.  He says he has history of gastroparesis but has not required any medication for it over the past several years.  He had been followed by Dr. [name] in the past, but has been discharged from that practice.  I believe he had EGD in 2015.  He does stay on Protonix at home and says he uses Phenergan on a daily basis for fairly chronic nausea.  He denies any aspirin or NSAID use.  EtOH was negative on admit, urine drug screen positive for cocaine and benzos.  Patient states he was not having any problems with his stomach prior to this admission.  Also  denies any problems with chronic constipation.  KUB on 07/01/2018 shows diffuse stool in the colon, KUB this a.m. shows NG tube in the stomach no dilated loops  Acute hepatitis panel negative on admission, HIV negative, SARS COVID negative CBC today with WBC 11.8, hemoglobin 14, potassium 3.1, BUN 55, creatinine 9.2. LFTs normal on admit. NG tube was placed last evening.  He has had about 400 cc out total thick liquid, no heme. He had been restarted on Suboxone on 06/30/2018. He says he feels much better, has not had any nausea today and is starving.  He adamantly wants the NG tube removed. Patient underwent dialysis this morning.   Past Medical History:  Diagnosis Date  . Diabetes mellitus without complication ([place])   . Renal disorder     History reviewed. No pertinent surgical history.  Prior to Admission medications   Medication Sig Start Date End Date Taking? Authorizing Provider  amLODipine (NORVASC) 10 MG tablet Take 10 mg by mouth daily. 05/15/18  Yes [provider]  Buprenorphine HCl-Naloxone HCl 8-2 MG FILM Place 1 Film under the tongue 3 (three) times daily. 06/14/18  Yes [provider]  carvedilol (COREG) 25 MG tablet Take 25 mg by mouth 2 (two) times daily. 06/19/18  Yes [provider]  DULoxetine (CYMBALTA) 30 MG capsule Take 30 mg by mouth 2 (two) times daily. 05/15/18  Yes [provider]  furosemide (LASIX) 40 MG tablet Take 40 mg by mouth daily. 06/19/18  Yes [provider]  HUMALOG KWIKPEN 100 UNIT/ML KwikPen Inject 45 Units into the skin daily. Inject into the  skin as directed per sliding (max dose 45 units daily) 06/19/18  Yes [provider]  hydrochlorothiazide (HYDRODIURIL) 12.5 MG tablet Take 12.5 mg by mouth daily. 06/19/18  Yes [provider]  LEVEMIR FLEXTOUCH 100 UNIT/ML Pen Inject 25 Units into the skin See admin instructions. 25 units in the morning and evening 06/19/18  Yes [provider]   pantoprazole (PROTONIX) 40 MG tablet Take 40 mg by mouth daily. 05/15/18  Yes [provider]  promethazine (PHENERGAN) 25 MG tablet Take 25 mg by mouth every 8 (eight) hours as needed for nausea/vomiting. 06/08/18  Yes [provider]  tiZANidine (ZANAFLEX) 4 MG tablet Take 4 mg by mouth 2 (two) times daily. 06/16/18  Yes [provider]  NOVOFINE 32G X 6 MM MISC USE TO INJECT INSULIN FIVE TIMES DAILY 06/19/18   [provider]    Current Facility-Administered Medications  Medication Dose Route Frequency Provider Last Rate Last Dose  . 0.9 %  sodium chloride infusion  100 mL Intravenous PRN Parrett, Tammy S, NP      . 0.9 %  sodium chloride infusion  100 mL Intravenous PRN Parrett, Fonnie Mu, NP   Stopped at 06/30/18 0744  . 0.9 %  sodium chloride infusion   Intravenous Continuous Hongalgi, Anand D, MD      . acetaminophen (TYLENOL) tablet 500 mg  500 mg Oral Q6H PRN Hongalgi, Anand D, MD      . alum & mag hydroxide-simeth (MAALOX/MYLANTA) 200-200-20 MG/5ML suspension 30 mL  30 mL Oral Q6H PRN Modena Jansky, MD   30 mL at 06/30/18 1418  . amLODipine (NORVASC) tablet 10 mg  10 mg Oral Daily Vernell Leep D, MD   10 mg at 06/30/18 9323  . buprenorphine-naloxone (SUBOXONE) 8-2 mg per SL tablet 1 tablet  1 tablet Sublingual TID Modena Jansky, MD   1 tablet at 06/30/18 2138  . chlorhexidine (PERIDEX) 0.12 % solution 15 mL  15 mL Mouth Rinse BID Parrett, Tammy S, NP   15 mL at 06/30/18 2138  . Chlorhexidine Gluconate Cloth 2 % PADS 6 each  6 each Topical Q0600 Parrett, Tammy S, NP   6 each at 07/02/18 0645  . Chlorhexidine Gluconate Cloth 2 % PADS 6 each  6 each Topical Q0600 Edrick Oh, MD   6 each at 07/02/18 [phone]  . dextrose 50 % solution 25 g  25 g Intravenous Q1H PRN Parrett, Tammy S, NP      . dicyclomine (BENTYL) tablet 20 mg  20 mg Oral Q6H PRN Hongalgi, Anand D, MD      . folic acid injection 1 mg  1 mg Intravenous Daily Parrett, Tammy S, NP   1 mg at  07/01/18 0952  . heparin injection 3,100 Units  40 Units/kg Dialysis PRN Parrett, Tammy S, NP   3,100 Units at 06/28/18 0332  . heparin injection 5,000 Units  5,000 Units Subcutaneous Q8H Parrett, Tammy S, NP   5,000 Units at 07/01/18 2230  . hydrALAZINE (APRESOLINE) injection 10-20 mg  10-20 mg Intravenous Q4H PRN Parrett, Tammy S, NP   20 mg at 06/30/18 0728  . hydrOXYzine (ATARAX/VISTARIL) tablet 25 mg  25 mg Oral Q6H PRN Modena Jansky, MD   25 mg at 07/01/18 0024  . insulin aspart (novoLOG) injection 0-9 Units  0-9 Units Subcutaneous Q4H Hongalgi, Anand D, MD      . insulin detemir (LEVEMIR) injection 10 Units  10 Units Subcutaneous Q12H Hongalgi, Lenis Dickinson,  MD      . labetalol (NORMODYNE) injection 20 mg  20 mg Intravenous Q10 min PRN Parrett, Tammy S, NP   20 mg at 06/28/18 4008  . labetalol (NORMODYNE) tablet 300 mg  300 mg Oral BID Modena Jansky, MD   300 mg at 07/01/18 2232  . loperamide (IMODIUM) capsule 2-4 mg  2-4 mg Oral PRN Hongalgi, Anand D, MD      . MEDLINE mouth rinse  15 mL Mouth Rinse q12n4p Parrett, Tammy S, NP      . methocarbamol (ROBAXIN) tablet 500 mg  500 mg Oral Q8H PRN Hongalgi, Anand D, MD      . ondansetron (ZOFRAN) injection 4 mg  4 mg Intravenous Q6H Hongalgi, Lenis Dickinson, MD   4 mg at 07/02/18 0430  . pantoprazole (PROTONIX) injection 40 mg  40 mg Intravenous Q12H Modena Jansky, MD   40 mg at 07/01/18 2225  . promethazine (PHENERGAN) injection 25 mg  25 mg Intravenous Q8H PRN Gardiner Barefoot, NP        Allergies as of 06/26/2018  . (No Known Allergies)    History reviewed. No pertinent family history.  Social History   Socioeconomic History  . Marital status: Single    Spouse name: Not on file  . Number of children: Not on file  . Years of education: Not on file  . Highest education level: Not on file  Occupational History  . Not on file  Social Needs  . Financial resource strain: Not on file  . Food insecurity:    Worry: Not on file     Inability: Not on file  . Transportation needs:    Medical: Not on file    Non-medical: Not on file  Tobacco Use  . Smoking status: Unknown If Ever Smoked  . Smokeless tobacco: Never Used  Substance and Sexual Activity  . Alcohol use: Not on file  . Drug use: Not on file  . Sexual activity: Not on file  Lifestyle  . Physical activity:    Days per week: Not on file    Minutes per session: Not on file  . Stress: Not on file  Relationships  . Social connections:    Talks on phone: Not on file    Gets together: Not on file    Attends religious service: Not on file    Active member of club or organization: Not on file    Attends meetings of clubs or organizations: Not on file    Relationship status: Not on file  . Intimate partner violence:    Fear of current or ex partner: Not on file    Emotionally abused: Not on file    Physically abused: Not on file    Forced sexual activity: Not on file  Other Topics Concern  . Not on file  Social History Narrative  . Not on file    Review of Systems:Pertinent positive and negative review of systems were noted in the above HPI section.  All other review of systems was otherwise negative.  Physical Exam: Vital signs in last 24 hours: Temp:  [97.6 F (36.4 C)-98.2 F (36.8 C)] 97.6 F (36.4 C) (05/26 1131) Pulse Rate:  [79-100] 93 (05/26 1131) Resp:  [12-18] 13 (05/26 1131) BP: (101-167)/(55-105) 113/78 (05/26 1131) SpO2:  [94 %-98 %] 97 % (05/26 1131) Weight:  [78.9 kg] 78.9 kg (05/26 0710) Last BM Date: 06/28/18 General:   Alert,  Well-developed, thin young white male, cooperative  in NAD Head:  Normocephalic and atraumatic. Eyes:  Sclera clear, no icterus.   Conjunctiva pink. Ears:  Normal auditory acuity. Nose:  No deformity, discharge,  or lesions.  NG tube in place Mouth:  No deformity or lesions.   Neck:  Supple; no masses or thyromegaly. Lungs:  Clear throughout to auscultation.   No wheezes, crackles, or rhonchi.   Heart:  Regular rate and rhythm; no murmurs, clicks, rubs,  or gallops. Abdomen:  Soft,nontender, BS active,nonpalp mass or hsm.   Rectal:  Deferred  Msk:  Symmetrical without gross deformities. . Pulses:  Normal pulses noted. Extremities:  Without clubbing or edema. Neurologic:  Alert and  oriented x4;  grossly normal neurologically. Skin:  Intact without significant lesions or rashes.. Psych:  Alert and cooperative. Normal mood and affect.  Intake/Output from previous day: No intake/output data recorded. Intake/Output this shift: Total I/O In: -  Out: 1200 [Other:1200]  Lab Results: Recent Labs    07/02/18 0723  WBC 11.8*  HGB 14.0  HCT 42.9  PLT 330   BMET Recent Labs    07/02/18 0723  NA 134*  K 3.1*  CL 85*  CO2 34*  GLUCOSE 93  BUN 55*  CREATININE 9.23*  CALCIUM 10.2   LFT Recent Labs    07/02/18 0723  ALBUMIN 4.0   PT/INR No results for input(s): LABPROT, INR in the last 72 hours. Hepatitis Panel No results for input(s): HEPBSAG, HCVAB, HEPAIGM, HEPBIGM in the last 72 hours.    IMPRESSION:  #2 33 year old white male with 2 to 3-day history of intractable nausea and vomiting, and setting of insulin-dependent diabetes mellitus, prior history of gastroparesis, opioid dependence and probable withdrawal. NG was placed last evening-no further nausea or vomiting, KUB without evidence of gastric distention or ileus/obstruction.  Suspect nausea and vomiting precipitated by opioid withdrawal as he had been off of Suboxone since admission.  He may have component of gastroparesis exacerbated by acute illness.  #2 end-stage renal disease on hemodialysis #3 history of medical noncompliance #4 history of substance abuse-drug screen positive for cocaine on admission #5 collapse and seizure-precipitating admission on 06/26/2018-followed by confusion and agitation now resolved -extensive infectious evaluation negative, per neuro felt to have toxic metabolic  encephalopathy secondary to vasospasm with cocaine #6 obstipation  PLAN: The patient is adamant about having NG tube removed, will DC NG tube and start on clear liquids Continue Zofran every 6 hours Continue Protonix 40 mg IV twice daily Add Reglan 10 mg IV every 6 hours, short-term Dulcolax suppository today.  Once he is taking p.o.'s will benefit from purge with at least MiraLAX Stop Bentyl No plans for endoscopic intervention at present, he fails to improve over the next 24 to 48 hours and may need EGD. We will follow with you  Amy Esterwood  07/02/2018, 11:57 AM  GI ATTENDING  History, laboratories, x-rays reviewed.  Agree with comprehensive consultation note as outlined above.  Agree with impression and plans as outlined without additional dilutions.  Will follow.  Docia Chuck. Geri Seminole., M.D. [facility] Division of Gastroenterology

## 2018-07-02 NOTE — Progress Notes (Signed)
  Echocardiogram 2D Echocardiogram has been performed.  Brent Daniels 07/02/2018, 3:50 PM

## 2018-07-02 NOTE — Progress Notes (Signed)
PROGRESS NOTE   Brent Daniels  JKD:[id number]    DOB: 1985-07-22    DOA: 06/26/2018  PCP: Glenda Chroman, MD   I have briefly reviewed patients previous medical records in [facility].  Patient has 2 medical records that have yet to be reconciled.  One is the current medical record and the other one has MRN number [id number].    Brief Narrative:  CCM transfer to [facility] 33/63: 33 year old male with PMH of ESRD on HD, noncompliant, HTN, DM 2, seizures but not on AEDs,Opiate dependence, substance abuse, on day of admission suddenly collapsed in front of his friend and had witnessed tonic-clonic seizure activity.  EMS noted normal BS, hypertensive (SBP's in the 200s) on arrival to ED, postictal, very confused and agitated, intubated in ED for CT of head and then successfully extubated shortly thereafter.  He was admitted to ICU by CCM.  Initial concern was for seizures.  Subsequently spiked a high fever with leukocytosis without clear source, underwent spinal tap and started on broad-spectrum antibiotics/acyclovir for possible meningoencephalitis.  Neurology suspects TME likely due to combination of cocaine induced vasospasm, hypertensive emergency/PRES and less likely due to CNS infection.  Nephrology on board for HD needs.  Infectious work-up negative, antibiotics and acyclovir discontinued, ID signed off 5/25.  Mental status changes resolved.  Hospital course complicated by intractable nausea and vomiting since 5/23 night, possibly diabetic gastroparesis but cannot rule out other etiologies as below, [facility] consulted 5/26.   Assessment & Plan:   Principal Problem:   Encephalopathy acute Active Problems:   Seizures ([place])   Hypertensive emergency   ESRD (end stage renal disease) on dialysis ([place])   Diabetes type 2, uncontrolled ([place])   Acute respiratory failure (HCC)   Poor dentition   FUO (fever of unknown origin)   Toxic metabolic encephalopathy  Head and C-spine 5/20 without acute  findings.  MRI brain 5/23: Limited exam due to lack of cooperation.  Reported findings suggestive of small vessel acute/early subacute infarction in the right anterior temporal white matter.  However I discussed this finding with Dr. Rory Percy, Neurology who indicates that this is not a stroke or a significant finding.  EEG 5/21: Abnormal, generalized background slowing with superimposed beta activity.  No epileptiform activity noted.  UDS 5/21: Positive for benzodiazepines and cocaine.  BAL <10.  CSF 5/21: WBC 113, segmented neutrophils 95.  Glucose 133 and CSF protein 34.  CSF culture: Negative.  Blood cultures x2: Negative to date.  Urine pneumococcal and Legionella antigen negative.  Acute hepatitis panel negative, HIV screen negative x2, RPR negative and cryptococcal antigen negative.  SARS coronavirus 2: Negative.  Initial concern on admission was for seizures and postictal state.  He had been loaded with IV Keppra.  However after extensive work-up and as discussed with Dr. Rory Percy, no CVA, less likely to be seizures, possible gabapentin-induced myoclonus in the setting of renal insufficiency, concern for meningoencephalitis-evaluation negative and all antibiotics and acyclovir discontinued and ID signed off 5/25, hypertensive emergency/PRES and uremia.  No maintenance AEDs. Blood pressure and CBG better controlled. Continue HD per nephrology.   Delirium precautions. Minimize sedatives/opioids.  Continue folate and completed IV thiamine  Acute encephalopathy resolved and stable.  CSF HSV 1 and 2 PCR negative.   Resolved.  Fever  Extensive evaluation as noted above.  Chest x-ray negative.  Panorex negative.  Unable to do TTE because patient had an episode of vomiting.  No fever since 5/22.  CSF HSV 1  and 2 PCR finally came back negative.  ID work-up unremarkable/negative.  Antibiotics discontinued several days ago and IV acyclovir stopped 5/25.  Resolved.  If he has  recurrence then reconsult ID.  Hypertensive emergency  Weaned off Cleviprex 5/21.  Labetalol 200 mg twice daily added 5/23.  Volume management across HD but does not appear volume overloaded.  Also on PRN IV hydralazine and labetalol  Resumed home amlodipine 10 mg daily.  Discontinued HCTZ due to ESRD on HD.  Home carvedilol not to be resumed at discharge due to active cocaine use history.  BP was still elevated.  Increased labetalol to 300 mg twice daily.  Better BP control today.  ESRD on HD  Management per nephrology.  Reportedly noncompliant as outpatient and only been to HD 3 times in the month of May.  He undergoes outpatient dialysis at [facility] in [place] on TTS schedule.  Dialyzed through right IJ HD catheter.  Last HD was 5/23 and seen at HD on 5/26  Hyperkalemia  Resolved after HD.  Type II DM with renal complications  S0Y 8.8 suggests poor outpatient control.  Reports checking CBGs multiple times at home but this history is not reliable.  Claims that he takes Levemir 20 units twice daily and SSI at home.  Increased Levemir to 15 units twice daily, continue NovoLog sensitive SSI and added NovoLog mealtime 3 units.  Monitor closely and adjust insulins as needed.  CBGs better controlled.  However now that patient is n.p.o., NG tube for GI bleed issues as noted below, fasting blood sugar in the 90s this morning, reduced Levemir to 10 units twice daily and changed SSI to every 4 hour, sensitive scale.  Discontinued mealtime NovoLog.  Monitor closely and adjust insulins as needed.  Substance abuse/cocaine  Counseling when able.  Chronic pain  Patient reports that he follows with Dr. Lonell Grandchild in [place] who prescribes his Suboxone.  Resumed Suboxone 5/24.  Pain seems to be better controlled.  Patient reports chronic abdominal pain.  Abdominal pain and nonbloody emesis  KUB 5/22 without acute findings.  DD: Diabetic gastroparesis which patient  states that he has had in the past, opioid withdrawal-now less likely since Suboxone has been resumed.  Continue IV PPI 40 mg every 12 hourly and Maalox as needed.    Patient has 2 medical records that have yet to be reconciled.  Upon review of his other medical record, he has been released from [facility]/Dr. Pilar Grammes practice due to no-show's.  He was then trying to establish with another Gastroenterologist in 2018 but not sure if this happened.  There is documented history of diabetic gastroparesis, severe reflux esophagitis, candida esophagitis, peptic ulcer disease chronic abdominal pain, recurrent vomiting.  He was supposed to have EGD sometime in 2017 which he declined.  Last EGD was from 2015.  Despite scheduled IV Zofran, PRN IV Phenergan, IV twice daily PPI, Maalox, ongoing intractable nausea and nonbloody emesis, overnight 5/25 NG tube was placed and changed to NPO.  KUB 5/25: Fairly diffuse stool throughout colon, paucity of bowel gas noted,?  Ileus or enteritis.  Bowel obstruction felt less likely.  No free air.  DD: Diabetic gastroparesis, ileus, PUD/gastritis/esophagitis versus other etiologies.  Due to persistent symptoms and no significant improvement, [facility] consulted 5/26.  I discussed with Dr. Marval Regal, okay to give small volume IV fluids while he is n.p.o. and unable to eat or drink.  Hypokalemia  Potassium 3.1.  Defer to Nephrology to manage across dialysis.  DVT prophylaxis: Subcutaneous heparin Code Status: Full Family Communication: I discussed in detail with patient's mother via phone, updated care and answered questions. Disposition: DC home pending clinical improvement.  Current barrier to discharge is is intractable nausea and vomiting.   Consultants:  PCCM-signed off Nephrology Neurology ID [facility]  Procedures:  Intubated/extubated in ED. LP 5/21 HD NG tube to low wall suction 5/26 >  Antimicrobials:  IV  acyclovir 5/21 > 5/25 IV ceftriaxone 5/21-5/22, discontinued 5/23 IV vancomycin x1 on 5/21, discontinued 5/23   Subjective: Overnight events noted.  Ongoing intractable nausea and nonbloody emesis.  NG tube placed to low wall suction early this morning.  Patient seen at dialysis this morning.  Feels better.  No further nausea or vomiting.  Chronic abdominal pain, no worse than his chronic pain.  Passing small amount of flatus.  No BM for last 5 days.  ROS: As above, otherwise negative.  Objective:  Vitals:   07/02/18 0800 07/02/18 0830 07/02/18 0900 07/02/18 0930  BP: 125/71 127/80 112/82 132/82  Pulse: 84 85 88 79  Resp: 16     Temp:      TempSrc:      SpO2:      Weight:      Height:        Examination:  General exam: Young male, moderately built and nourished, lying comfortably supine in bed undergoing HD. Respiratory system: Clear to auscultation.  Clear to auscultation.  Right anterior chest IJ HD catheter. Cardiovascular system: S1 & S2 heard, RRR. No JVD, murmurs, rubs, gallops or clicks. No pedal edema.  Telemetry personally reviewed: Sinus rhythm.  Stable Gastrointestinal system: Abdomen is nondistended, soft and nontender. No organomegaly or masses felt.  Possible paucity of bowel sounds. Central nervous system: Alert and oriented x4. No focal neurological deficits. Extremities: Symmetric 5 x 5 power. Skin: No rashes, lesions or ulcers Psychiatry: Judgement and insight intact. Mood & affect appropriate.     Data Reviewed: I have personally reviewed following labs and imaging studies  CBC: Recent Labs  Lab 06/26/18 2200 06/26/18 2320 06/27/18 0509 06/28/18 0744 06/29/18 0418 07/02/18 0723  WBC 12.6*  --  21.2* 15.5* 12.8* 11.8*  NEUTROABS 8.2*  --   --   --   --   --   HGB 12.2* 11.9* 12.3* 13.2 13.6 14.0  HCT 38.0* 35.0* 38.3* 40.4 41.0 42.9  MCV 93.8  --  94.6 91.8 91.1 91.1  PLT 284  --  245 276 269 702   Basic Metabolic Panel: Recent Labs  Lab  06/27/18 0509 06/27/18 1550 06/28/18 0744 06/29/18 0418 07/02/18 0723  NA 135 143 136 135 134*  K 6.6* 5.1 5.2* 4.9 3.1*  CL 99 106 98 96* 85*  CO2 19* 22 20* 19* 34*  GLUCOSE 470* 80 143* 245* 93  BUN 58* 66* 33* 47* 55*  CREATININE 7.75* 9.00* 6.01* 7.97* 9.23*  CALCIUM 9.3 9.6 9.8 9.8 10.2  MG 2.0  --   --   --   --   PHOS 6.5*  --   --   --  8.9*   Liver Function Tests: Recent Labs  Lab 06/26/18 2200 07/02/18 0723  AST 23  --   ALT 19  --   ALKPHOS 83  --   BILITOT 0.7  --   PROT 7.5  --   ALBUMIN 4.3 4.0   Coagulation Profile: Recent Labs  Lab 06/26/18 2200  INR 1.1   Cardiac Enzymes: Recent Labs  Lab 06/26/18 2200 06/27/18 0509  CKTOTAL 201 221   HbA1C: No results for input(s): HGBA1C in the last 72 hours. CBG: Recent Labs  Lab 07/01/18 0645 07/01/18 1125 07/01/18 1609 07/01/18 2105 07/02/18 0624  GLUCAP 179* 225* 206* 178* 97    Recent Results (from the past 240 hour(s))  SARS Coronavirus 2 (CEPHEID- Performed in [place] hospital lab), Hosp Order     Status: None   Collection Time: 06/26/18 10:50 PM  Result Value Ref Range Status   SARS Coronavirus 2 NEGATIVE NEGATIVE Final    Comment: (NOTE) If result is NEGATIVE SARS-CoV-2 target nucleic acids are NOT DETECTED. The SARS-CoV-2 RNA is generally detectable in upper and lower  respiratory specimens during the acute phase of infection. The lowest  concentration of SARS-CoV-2 viral copies this assay can detect is 250  copies / mL. A negative result does not preclude SARS-CoV-2 infection  and should not be used as the sole basis for treatment or other  patient management decisions.  A negative result may occur with  improper specimen collection / handling, submission of specimen other  than nasopharyngeal swab, presence of viral mutation(s) within the  areas targeted by this assay, and inadequate number of viral copies  (<250 copies / mL). A negative result must be combined with clinical    observations, patient history, and epidemiological information. If result is POSITIVE SARS-CoV-2 target nucleic acids are DETECTED. The SARS-CoV-2 RNA is generally detectable in upper and lower  respiratory specimens dur ing the acute phase of infection.  Positive  results are indicative of active infection with SARS-CoV-2.  Clinical  correlation with patient history and other diagnostic information is  necessary to determine patient infection status.  Positive results do  not rule out bacterial infection or co-infection with other viruses. If result is PRESUMPTIVE POSTIVE SARS-CoV-2 nucleic acids MAY BE PRESENT.   A presumptive positive result was obtained on the submitted specimen  and confirmed on repeat testing.  While 2019 novel coronavirus  (SARS-CoV-2) nucleic acids may be present in the submitted sample  additional confirmatory testing may be necessary for epidemiological  and / or clinical management purposes  to differentiate between  SARS-CoV-2 and other Sarbecovirus currently known to infect humans.  If clinically indicated additional testing with an alternate test  methodology [phone]) is advised. The SARS-CoV-2 RNA is generally  detectable in upper and lower respiratory sp ecimens during the acute  phase of infection. The expected result is Negative. Fact Sheet for Patients:  StrictlyIdeas.no Fact Sheet for Healthcare Providers: BankingDealers.co.za This test is not yet approved or cleared by the [place] FDA and has been authorized for detection and/or diagnosis of SARS-CoV-2 by FDA under an Emergency Use Authorization (EUA).  This EUA will remain in effect (meaning this test can be used) for the duration of the COVID-19 declaration under Section 564(b)(1) of the Act, 21 U.S.C. section 360bbb-3(b)(1), unless the authorization is terminated or revoked sooner. Performed at [facility], [place] [address],  [place], [place] [address]   Culture, blood (Routine X 2) w Reflex to ID Panel     Status: None   Collection Time: 06/27/18 12:52 PM  Result Value Ref Range Status   Specimen Description BLOOD LEFT ANTECUBITAL  Final   Special Requests   Final    BOTTLES DRAWN AEROBIC ONLY Blood Culture adequate volume   Culture   Final    NO GROWTH 5 DAYS Performed at [facility], [place]  [address] [place], [place] [address]    Report Status 07/02/2018 FINAL  Final  Culture, blood (Routine X 2) w Reflex to ID Panel     Status: None   Collection Time: 06/27/18  1:00 PM  Result Value Ref Range Status   Specimen Description BLOOD RIGHT HAND  Final   Special Requests   Final    BOTTLES DRAWN AEROBIC ONLY Blood Culture adequate volume   Culture   Final    NO GROWTH 5 DAYS Performed at [facility], [place] [address], [place], [place] [address]    Report Status 07/02/2018 FINAL  Final  MRSA PCR Screening     Status: None   Collection Time: 06/27/18  1:06 PM  Result Value Ref Range Status   MRSA by PCR NEGATIVE NEGATIVE Final    Comment:        The GeneXpert MRSA Assay (FDA approved for NASAL specimens only), is one component of a comprehensive MRSA colonization surveillance program. It is not intended to diagnose MRSA infection nor to guide or monitor treatment for MRSA infections. Performed at [facility], [place] [address] [place], [place] [address]   CSF culture with Stat gram stain     Status: None   Collection Time: 06/27/18  2:49 PM  Result Value Ref Range Status   Specimen Description CSF  Final   Special Requests NONE  Final   Gram Stain   Final    WBC PRESENT, PREDOMINANTLY PMN NO ORGANISMS SEEN CYTOSPIN SMEAR    Culture   Final    NO GROWTH 3 DAYS Performed at [facility], [place] [address] [place], [place] [address]    Report Status 06/30/2018 FINAL  Final  Fungus Culture With Stain     Status: None (Preliminary result)   Collection Time: 06/27/18  2:49 PM    Result Value Ref Range Status   Fungus Stain Final report  Final    Comment: (NOTE) Performed At: [facility] [place], [place] [id number] Rush Farmer MD JS:[id number]    Fungus (Mycology) Culture PENDING  Incomplete   Fungal Source CSF  Final    Comment: Performed at [facility], [place] [address] [place], [place] [address]  Fungus Culture Result     Status: None   Collection Time: 06/27/18  2:49 PM  Result Value Ref Range Status   Result 1 Comment  Final    Comment: (NOTE) KOH/Calcofluor preparation:  no fungus observed. Performed At: [facility] [place], [place] [id number] Rush Farmer MD OE:[id number]          Radiology Studies: Dg Orthopantogram  Result Date: 06/30/2018 CLINICAL DATA:  Fever.  Possible infection. EXAM: ORTHOPANTOGRAM/PANORAMIC COMPARISON:  None. FINDINGS: No evidence for mandibular fracture. Temporomandibular joints appear located. Maxillary sinuses are clear. No evidence for period a coal lucency associated with any of the lower teeth. No definite lucency seen around the roots of the upper teeth although superimposed bony anatomy hinders assessment. IMPRESSION: No definite findings to suggest periapical abscess. Electronically Signed   By: Misty Stanley M.D.   On: 06/30/2018 14:53   Dg Abd 1 View  Result Date: 07/02/2018 CLINICAL DATA:  Check gastric catheter placement EXAM: ABDOMEN - 1 VIEW COMPARISON:  07/01/2018 FINDINGS: Gastric catheter is now seen within the stomach. No free air is noted. IMPRESSION: Gastric catheter within the stomach. Electronically Signed   By: Inez Catalina M.D.   On: 07/02/2018 02:27  Dg Abd 1 View  Result Date: 07/01/2018 CLINICAL DATA:  Nausea and vomiting EXAM: ABDOMEN - 1 VIEW COMPARISON:  Jun 28, 2018 FINDINGS: There is fairly diffuse stool throughout the colon. There is a paucity of bowel gas. There is no bowel dilatation or air-fluid level to suggest bowel  obstruction. No free air. Surgical clips noted in right mid abdomen. Visualized lung bases clear. IMPRESSION: Fairly diffuse stool throughout colon. Paucity of bowel gas noted. While this finding may be seen normally, it also may be indicative of ileus or enteritis. Bowel obstruction felt to be less likely. No free air. Electronically Signed   By: Lowella Grip III M.D.   On: 07/01/2018 22:27        Scheduled Meds:  amLODipine  10 mg Oral Daily   buprenorphine-naloxone  1 tablet Sublingual TID   chlorhexidine  15 mL Mouth Rinse BID   Chlorhexidine Gluconate Cloth  6 each Topical Q0600   Chlorhexidine Gluconate Cloth  6 each Topical T7711   folic acid  1 mg Intravenous Daily   heparin  5,000 Units Subcutaneous Q8H   insulin aspart  0-9 Units Subcutaneous Q4H   insulin detemir  10 Units Subcutaneous Q12H   labetalol  300 mg Oral BID   mouth rinse  15 mL Mouth Rinse q12n4p   ondansetron (ZOFRAN) IV  4 mg Intravenous Q6H   pantoprazole (PROTONIX) IV  40 mg Intravenous Q12H   Continuous Infusions:  sodium chloride     sodium chloride Stopped (06/30/18 0744)     LOS: 6 days     Vernell Leep, MD, FACP, [facility]. [facility]  To contact the attending provider between 7A-7P or the covering provider during after hours 7P-7A, please log into the web site [email or web address] and access using universal  password for that web site. If you do not have the password, please call the hospital operator.  07/02/2018, 9:56 AM

## 2018-07-02 NOTE — Procedures (Signed)
I was present at this dialysis session. I have reviewed the session itself and made appropriate changes.   Vital signs in last 24 hours:  Temp:  [97.7 F (36.5 C)-98.2 F (36.8 C)] 97.9 F (36.6 C) (05/26 0710) Pulse Rate:  [82-100] 84 (05/26 0710) Resp:  [12-18] 13 (05/26 0710) BP: (112-167)/(76-105) 112/76 (05/26 0710) SpO2:  [95 %-98 %] 97 % (05/26 0710) Weight:  [78.9 kg] 78.9 kg (05/26 0710) Weight change: 4 kg Filed Weights   07/01/18 0237 07/02/18 0431 07/02/18 0710  Weight: 74.9 kg 78.9 kg 78.9 kg    Recent Labs  Lab 06/27/18 0509  06/29/18 0418  NA 135   < > 135  K 6.6*   < > 4.9  CL 99   < > 96*  CO2 19*   < > 19*  GLUCOSE 470*   < > 245*  BUN 58*   < > 47*  CREATININE 7.75*   < > 7.97*  CALCIUM 9.3   < > 9.8  PHOS 6.5*  --   --    < > = values in this interval not displayed.    Recent Labs  Lab 06/26/18 2200  06/28/18 0744 06/29/18 0418 07/02/18 0723  WBC 12.6*   < > 15.5* 12.8* 11.8*  NEUTROABS 8.2*  --   --   --   --   HGB 12.2*   < > 13.2 13.6 14.0  HCT 38.0*   < > 40.4 41.0 42.9  MCV 93.8   < > 91.8 91.1 91.1  PLT 284   < > 276 269 330   < > = values in this interval not displayed.    Scheduled Meds: . amLODipine  10 mg Oral Daily  . buprenorphine-naloxone  1 tablet Sublingual TID  . chlorhexidine  15 mL Mouth Rinse BID  . Chlorhexidine Gluconate Cloth  6 each Topical Q0600  . Chlorhexidine Gluconate Cloth  6 each Topical Q0600  . folic acid  1 mg Intravenous Daily  . heparin  5,000 Units Subcutaneous Q8H  . insulin aspart  0-9 Units Subcutaneous Q4H  . insulin detemir  10 Units Subcutaneous Q12H  . labetalol  300 mg Oral BID  . mouth rinse  15 mL Mouth Rinse q12n4p  . ondansetron (ZOFRAN) IV  4 mg Intravenous Q6H  . pantoprazole (PROTONIX) IV  40 mg Intravenous Q12H   Continuous Infusions: . sodium chloride    . sodium chloride Stopped (06/30/18 0744)   PRN Meds:.sodium chloride, sodium chloride, acetaminophen, alum & mag  hydroxide-simeth, dextrose, dicyclomine, heparin, hydrALAZINE, hydrOXYzine, labetalol, loperamide, methocarbamol, promethazine    Dialysis Prescription:  [facility] [place], TTS edw 77kg, time 4 hrs, BFR 350, DFR 600, 2K/2.5 Ca, RIJ TDC, heparin 800 units bolus, 400units/hr  Assessment/ Plan:   1. Seizure- mental status is improving.  Off of acyclovir. 2. ESRD continue with HD qTTS 3. Anemia: no ESA due to Hgb >12 4. 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 Nutrition: renal diet 6. Hypertension: elevated, follow with UF at HD. 7. Abdominal pain with nausea and vomiting- likely diabetic gastroparesis. S/p NGT. 8. Noncompliance due to polysubstance abuse   Donetta Potts,  MD 07/02/2018, 8:05 AM

## 2018-07-03 DIAGNOSIS — E11649 Type 2 diabetes mellitus with hypoglycemia without coma: Secondary | ICD-10-CM

## 2018-07-03 DIAGNOSIS — J9601 Acute respiratory failure with hypoxia: Secondary | ICD-10-CM

## 2018-07-03 DIAGNOSIS — K089 Disorder of teeth and supporting structures, unspecified: Secondary | ICD-10-CM

## 2018-07-03 LAB — BASIC METABOLIC PANEL
Anion gap: 15 (ref 5–15)
BUN: 42 mg/dL — ABNORMAL HIGH (ref 6–20)
CO2: 26 mmol/L (ref 22–32)
Calcium: 9.9 mg/dL (ref 8.9–10.3)
Chloride: 89 mmol/L — ABNORMAL LOW (ref 98–111)
Creatinine, Ser: 8.13 mg/dL — ABNORMAL HIGH (ref 0.61–1.24)
GFR calc Af Amer: 9 mL/min — ABNORMAL LOW (ref >60)
GFR calc non Af Amer: 8 mL/min — ABNORMAL LOW (ref >60)
Glucose, Bld: 96 mg/dL (ref 70–99)
Potassium: 4 mmol/L (ref 3.5–5.1)
Sodium: 130 mmol/L — ABNORMAL LOW (ref 135–145)

## 2018-07-03 LAB — GLUCOSE, CAPILLARY
Glucose-Capillary: 340 mg/dL — ABNORMAL HIGH (ref 70–99)
Glucose-Capillary: 113 mg/dL — ABNORMAL HIGH (ref 70–99)
Glucose-Capillary: 95 mg/dL (ref 70–99)

## 2018-07-03 MED ORDER — ACETAMINOPHEN 500 MG PO TABS: 500.0000 mg | ORAL_TABLET | Freq: Four times a day (QID) | ORAL | 0 refills | Status: DC | PRN

## 2018-07-03 MED ORDER — LABETALOL HCL 200 MG PO TABS: 200.0000 mg | ORAL_TABLET | Freq: Two times a day (BID) | ORAL | Status: DC

## 2018-07-03 MED ORDER — BISACODYL 10 MG RE SUPP: 10.0000 mg | Freq: Every day | RECTAL | 0 refills | Status: DC | PRN

## 2018-07-03 MED ORDER — FOLIC ACID 1 MG PO TABS: 1.0000 mg | ORAL_TABLET | Freq: Every day | ORAL | Status: DC

## 2018-07-03 MED ORDER — METOCLOPRAMIDE HCL 5 MG PO TABS
5.0000 mg | ORAL_TABLET | Freq: Three times a day (TID) | ORAL | Status: DC
  Administered 2018-07-03: 11:00:00 5 mg via ORAL
  Filled 2018-07-03: qty 1

## 2018-07-03 MED ORDER — PROMETHAZINE HCL 25 MG PO TABS: 25.0000 mg | ORAL_TABLET | Freq: Three times a day (TID) | ORAL | 0 refills | Status: DC | PRN

## 2018-07-03 MED ORDER — FOLIC ACID 1 MG PO TABS: 1.0000 mg | ORAL_TABLET | Freq: Every day | ORAL | 0 refills | Status: DC

## 2018-07-03 MED ORDER — PANTOPRAZOLE SODIUM 40 MG PO TBEC: 40.0000 mg | DELAYED_RELEASE_TABLET | Freq: Every day | ORAL | Status: DC

## 2018-07-03 MED ORDER — LABETALOL HCL 200 MG PO TABS: 200.0000 mg | ORAL_TABLET | Freq: Two times a day (BID) | ORAL | 0 refills | Status: DC

## 2018-07-03 MED ORDER — METOCLOPRAMIDE HCL 5 MG PO TABS: 5.0000 mg | ORAL_TABLET | Freq: Three times a day (TID) | ORAL | 0 refills | Status: DC

## 2018-07-03 MED ORDER — ONDANSETRON HCL 4 MG/2ML IJ SOLN: 4.0000 mg | Freq: Four times a day (QID) | INTRAMUSCULAR | Status: DC | PRN

## 2018-07-03 MED ORDER — ALUM & MAG HYDROXIDE-SIMETH 200-200-20 MG/5ML PO SUSP: 30.0000 mL | Freq: Four times a day (QID) | ORAL | 0 refills | Status: DC | PRN

## 2018-07-03 NOTE — TOC Initial Note (Signed)
Transition of Care [facility]) - Initial/Assessment Note    Patient Details  Name: Brent Daniels MRN: [id number] Date of Birth: [birth year]  [facility] CM/SW Contact:    Pollie Friar, RN Phone Number: 07/03/2018, 1:26 PM  Clinical Narrative:                 Pt denies issues obtaining home meds.  Pt denies issues with transportation.  Expected Discharge Plan: Home/Self Care Barriers to Discharge: Continued Medical Work up   Patient Goals and CMS Choice Patient states their goals for this hospitalization and ongoing recovery are:: to eat and get out of hospital      Expected Discharge Plan and Services Expected Discharge Plan: Home/Self Care       Living arrangements for the past 2 months: Mobile Home(4 steps to enter)                                      Prior Living Arrangements/Services Living arrangements for the past 2 months: Mobile Home(4 steps to enter) Lives with:: Parents(Mom) Patient language and need for interpreter reviewed:: Yes(no needs) Do you feel safe going back to the place where you live?: Yes      Need for Family Participation in Patient Care: No (Comment) Care giver support system in place?: Yes (comment)   Criminal Activity/Legal Involvement Pertinent to Current Situation/Hospitalization: No - Comment as needed  Activities of Daily Living      Permission Sought/Granted                  Emotional Assessment Appearance:: Appears stated age Attitude/Demeanor/Rapport: Guarded Affect (typically observed): Guarded Orientation: : Oriented to Self, Oriented to Place, Oriented to  Time, Oriented to Situation   Psych Involvement: No (comment)  Admission diagnosis:  Acute respiratory failure, unspecified whether with hypoxia or hypercapnia ([place]) [J96.00] Patient Active Problem List   Diagnosis Date Noted  . Intractable vomiting   . Acute respiratory failure ([place])   . Poor dentition   . FUO (fever of unknown origin)   .  Seizures ([place]) 06/27/2018  . Hypertensive emergency 06/27/2018  . ESRD (end stage renal disease) on dialysis ([place]) 06/27/2018  . Diabetes type 2, uncontrolled ([place]) 06/27/2018  . Encephalopathy acute 06/26/2018   PCP:  Glenda Chroman, MD Pharmacy:   [facility], [place] [address] [place] [address] Phone: [phone] Fax: [phone]     Social Determinants of Health (SDOH) Interventions    Readmission Risk Interventions No flowsheet data found.

## 2018-07-03 NOTE — Progress Notes (Signed)
Inpatient Diabetes Program Recommendations  AACE/ADA: New Consensus Statement on Inpatient Glycemic Control   Target Ranges:  Prepandial:   less than 140 mg/dL      Peak postprandial:   less than 180 mg/dL (1-2 hours)      Critically ill patients:  140 - 180 mg/dL   Results for Brent Daniels, Brent Daniels (MRN [id number]) as of 07/03/2018 11:25  Ref. Range 07/02/2018 06:24 07/02/2018 11:32 07/02/2018 17:33 07/02/2018 21:29 07/02/2018 22:54 07/03/2018 06:13 07/03/2018 10:56  Glucose-Capillary Latest Ref Range: 70 - 99 mg/dL 97 131 (H) 360 (H) 449 (H) 340 (H) 113 (H) 95   Review of Glycemic Control  Outpatient Diabetes medications: Levemir 25 units BID, Humalog 45 units daily as directed per sliding scale Current orders for Inpatient glycemic control: Levemir 10 units Q12H, Novolog 0-9 units TID with meals, Novolog 0-5 units QHS  Inpatient Diabetes Program Recommendations:   Insulin - Basal: Please consider chaning Levemir to 14 units QAM and continue Levemir 10 units QHS.  Insulin - Meal Coverage: Please consider ordering Novolog 3 units TID with meals for meal coverage if patient eats at least 50% of meals.  Thanks, Barnie Alderman, RN, MSN, CDE Diabetes Coordinator Inpatient Diabetes Program [phone] (Team Pager from 8am to 5pm)

## 2018-07-03 NOTE — Progress Notes (Signed)
Patient being discharged home. Education and instructions provided to patient. IV removed. CCMD notified. All belongings with patient. Leaving unit via wheelchair.

## 2018-07-03 NOTE — Progress Notes (Signed)
Patient ID: Brent Daniels, male   DOB: Aug 01, 1985, 33 y.o.   MRN: [id number] S: feels better and is "starving" O:BP 116/75 (BP Location: Left Arm)   Pulse 85   Temp 97.6 F (36.4 C) (Oral)   Resp 15   Ht 6' (1.829 m)   Wt 77 kg   SpO2 100%   BMI 23.02 kg/m   Intake/Output Summary (Last 24 hours) at 07/03/2018 1140 Last data filed at 07/03/2018 0400 Gross per 24 hour  Intake 887.44 ml  Output 300 ml  Net 587.44 ml   Intake/Output: I/O last 3 completed shifts: In: 887.4 [P.O.:462; I.V.:425.4] Out: 1500 [Urine:300; Other:1200]  Intake/Output this shift:  No intake/output data recorded. Weight change: 0 kg Gen: NAD CVS: no rub Resp: cta Abd: benign Ext: no edema  Recent Labs  Lab 06/26/18 2200 06/26/18 2320 06/27/18 0509 06/27/18 1550 06/28/18 0744 06/29/18 0418 07/02/18 0723 07/03/18 0724  NA 140 139 135 143 136 135 134* 130*  K 4.7 4.5 6.6* 5.1 5.2* 4.9 3.1* 4.0  CL 105  --  99 106 98 96* 85* 89*  CO2 22  --  19* 22 20* 19* 34* 26  GLUCOSE 68*  --  470* 80 143* 245* 93 96  BUN 58*  --  58* 66* 33* 47* 55* 42*  CREATININE 7.73*  --  7.75* 9.00* 6.01* 7.97* 9.23* 8.13*  ALBUMIN 4.3  --   --   --   --   --  4.0  --   CALCIUM 9.9  --  9.3 9.6 9.8 9.8 10.2 9.9  PHOS  --   --  6.5*  --   --   --  8.9*  --   AST 23  --   --   --   --   --   --   --   ALT 19  --   --   --   --   --   --   --    Liver Function Tests: Recent Labs  Lab 06/26/18 2200 07/02/18 0723  AST 23  --   ALT 19  --   ALKPHOS 83  --   BILITOT 0.7  --   PROT 7.5  --   ALBUMIN 4.3 4.0   No results for input(s): LIPASE, AMYLASE in the last 168 hours. No results for input(s): AMMONIA in the last 168 hours. CBC: Recent Labs  Lab 06/26/18 2200  06/27/18 0509 06/28/18 0744 06/29/18 0418 07/02/18 0723  WBC 12.6*  --  21.2* 15.5* 12.8* 11.8*  NEUTROABS 8.2*  --   --   --   --   --   HGB 12.2*   < > 12.3* 13.2 13.6 14.0  HCT 38.0*   < > 38.3* 40.4 41.0 42.9  MCV 93.8  --  94.6 91.8 91.1  91.1  PLT 284  --  245 276 269 330   < > = values in this interval not displayed.   Cardiac Enzymes: Recent Labs  Lab 06/26/18 2200 06/27/18 0509  CKTOTAL 201 221   CBG: Recent Labs  Lab 07/02/18 1733 07/02/18 2129 07/02/18 2254 07/03/18 0613 07/03/18 1056  GLUCAP 360* 449* 340* 113* 95    Iron Studies: No results for input(s): IRON, TIBC, TRANSFERRIN, FERRITIN in the last 72 hours. Studies/Results: Dg Abd 1 View  Result Date: 07/02/2018 CLINICAL DATA:  Check gastric catheter placement EXAM: ABDOMEN - 1 VIEW COMPARISON:  07/01/2018 FINDINGS: Gastric catheter is now seen within the  stomach. No free air is noted. IMPRESSION: Gastric catheter within the stomach. Electronically Signed   By: Inez Catalina M.D.   On: 07/02/2018 02:27   Dg Abd 1 View  Result Date: 07/01/2018 CLINICAL DATA:  Nausea and vomiting EXAM: ABDOMEN - 1 VIEW COMPARISON:  Jun 28, 2018 FINDINGS: There is fairly diffuse stool throughout the colon. There is a paucity of bowel gas. There is no bowel dilatation or air-fluid level to suggest bowel obstruction. No free air. Surgical clips noted in right mid abdomen. Visualized lung bases clear. IMPRESSION: Fairly diffuse stool throughout colon. Paucity of bowel gas noted. While this finding may be seen normally, it also may be indicative of ileus or enteritis. Bowel obstruction felt to be less likely. No free air. Electronically Signed   By: Lowella Grip III M.D.   On: 07/01/2018 22:27   . amLODipine  10 mg Oral Daily  . buprenorphine-naloxone  1 tablet Sublingual TID  . chlorhexidine  15 mL Mouth Rinse BID  . Chlorhexidine Gluconate Cloth  6 each Topical Q0600  . Chlorhexidine Gluconate Cloth  6 each Topical Q0600  . folic acid  1 mg Intravenous Daily  . heparin  5,000 Units Subcutaneous Q8H  . insulin aspart  0-5 Units Subcutaneous QHS  . insulin aspart  0-9 Units Subcutaneous TID WC  . insulin detemir  10 Units Subcutaneous Q12H  . labetalol  200 mg Oral BID   . mouth rinse  15 mL Mouth Rinse q12n4p  . metoCLOPramide  5 mg Oral TID AC  . [START ON 07/04/2018] pantoprazole  40 mg Oral Q0600    BMET    Component Value Date/Time   NA 130 (L) 07/03/2018 0724   K 4.0 07/03/2018 0724   CL 89 (L) 07/03/2018 0724   CO2 26 07/03/2018 0724   GLUCOSE 96 07/03/2018 0724   BUN 42 (H) 07/03/2018 0724   CREATININE 8.13 (H) 07/03/2018 0724   CALCIUM 9.9 07/03/2018 0724   GFRNONAA 8 (L) 07/03/2018 0724   GFRAA 9 (L) 07/03/2018 0724   CBC    Component Value Date/Time   WBC 11.8 (H) 07/02/2018 0723   RBC 4.71 07/02/2018 0723   HGB 14.0 07/02/2018 0723   HCT 42.9 07/02/2018 0723   PLT 330 07/02/2018 0723   MCV 91.1 07/02/2018 0723   MCH 29.7 07/02/2018 0723   MCHC 32.6 07/02/2018 0723   RDW 15.9 (H) 07/02/2018 0723   LYMPHSABS 2.8 06/26/2018 2200   MONOABS 0.9 06/26/2018 2200   EOSABS 0.6 (H) 06/26/2018 2200   BASOSABS 0.1 06/26/2018 2200     Dialysis Prescription: [facility], TTS edw 77kg, time 4 hrs, BFR 350, DFR 600, 2K/2.5 Ca, RIJ TDC, heparin 800 units bolus, 400units/hr  Assessment/ Plan:  1. Seizure- mental status is improving. Off of acyclovir. 2. ESRDcontinue with HD qTTS 3. Anemia:no ESA due to Hgb >12 4. CKD-MBD:cont with home meds and renal diet 5. Nutrition:renal diet 6. Hypertension:elevated, follow with UF at HD. 7. Abdominal pain with nausea and vomiting- likely diabetic gastroparesis. S/p NGT. 8. Noncompliance due to polysubstance abuse 9. Disposition- stable for discharge from renal standpoint.  If tolerates lunch ok for discharge per GI.  He will f/u with his outpatient HD tomorrow in [place].  Donetta Potts, MD [employer] [phone]

## 2018-07-03 NOTE — Discharge Summary (Signed)
Physician Discharge Summary  Brent Daniels ION:[id number] DOB: 09-02-1985 DOA: 06/26/2018  PCP: Brent Chroman, MD  Admit date: 06/26/2018 Discharge date: 07/03/2018  Admitted From: home  Disposition:  home   Recommendations for Outpatient Follow-up:  1. Needs to f/u with GI  Home Health:  none  Equipment/Devices:  none    Discharge Condition:  stable   CODE STATUS:  Full code  Consultations:  Nephro, Neuro, ID    Discharge Diagnoses:  Principal Problem:   Encephalopathy acute Active Problems:   Hypertensive emergency   ESRD (end stage renal disease) on dialysis ([place])   Diabetes type 2, uncontrolled ([place])   Poor dentition   FUO (fever of unknown origin)   Intractable vomiting - probable gastroparesis  Chronic pain  Brief Summary: Brent Daniels is a 33 y/o wth ESRD on HD, DM, HTN, Seizure disorder. Noted to have a what was described to be a tonic clonic seizure at a friend's house and brought to ED.  SBP was 230s and 240s in ED.   Noted to be confused and agitated and intubated for head CT. Subsequently extubated in ED. Admitted to critical care.   Per history obtained by neuro from mother "she says that he was taking gabapentin for pain and because of his worsening renal function he started having some jerking movements which at times progressed to seizures. They discontinued his gabapentin and start him on dialysis and the seizures did not recur after that. He is never seen a neurologist for that."   Noted to be persistently confused and agitated in ICU and then developed a fever of 102. LP done on 5/21 and started on Vanc, Ceftriaxone and Acylcovir. Given Cleviprex for HTN initially. EEG done on 5/21- showed generalized background slowing UDS + for Cocaine and Benzos- NOTE: Ativan given in ED could account for Benzo  Fever 101 on 5/22 ID consulted on 5/23 - IV antibiotics d/c'd, Acyclovir continued, Panorex ordered  Hospital Course:  Principal Problem:   Acute toxic Encephalopathy   - ? Due to drug (UDS + for Cocaine and Benzo), hypertensive emergency, non compliance with dialysis- Neuro felt his confusion may have been related to Cocaine use and vasospasm (and resultant HTN) - MRI brain 5/23: Limited exam due to lack of cooperation. Reported findings suggestive of small vessel acute/early subacute infarction in the right anterior temporal white matter.  Dr. Rory Daniels did not feel it was a significant finding. - CSF 5/21: WBC 113, segmented neutrophils 95. Glucose 133 and CSF protein 34. - CSF culture: Negative to date. - Blood cultures x2: Negative to date. - RPR negative and cryptococcal antigen negative.   Active Problems:   Seizures? 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 - started on Labetalol - currently on 300 mg twice daily  - home Carvedilol still on hold due to cocaine abuse history- should not resume - Resumed home amlodipine 10 mg daily - BP slightly low-will cut back on Labetalol to 200 BID -   home Lasix and HCTZ have not been resumed     FUO (fever of unknown origin)  - neg CXR and Panorex - HSV 1 and 2 on CSF negative - antibiotics stopped on 5/25- no recurrence of fevers    ESRD (end stage renal disease) on dialysis  - reported to  be non compliant    Diabetes type 2, uncontrolled - A1c 8.8 - cont Levemir and Novolog SSI    Substance abuse- benzo and Cocaine + on UDS - received Ativan in ED which could account for the Benzos on the UDS  Chronic pain - on Suboxone per Dr Brent Daniels in [place]  Vomiting and abdominal pain - ? opoid withdrawal- may also have gastroparesis- he declined an EGD in 2017 - GI consulted on 5/26 and NG removed, Reglan added - cont Protonix 40 mg oral daily and add Reglan 5 mg TID AC and  prescribe PRN Phenergan    Discharge Exam: Vitals:   07/03/18 1100 07/03/18 1103  BP:  131/84  Pulse:  85  Resp: 12 12  Temp:  97.6 F (36.4 C)  SpO2:  100%   Vitals:   07/03/18 0500 07/03/18 0808 07/03/18 1100 07/03/18 1103  BP:  116/75  131/84  Pulse:  72  85  Resp:  15 12 12   Temp:  (!) 97.5 F (36.4 C)  97.6 F (36.4 C)  TempSrc:  Oral  Oral  SpO2:  97%  100%  Weight: 77 kg     Height:        General: Pt is alert, awake, not in acute distress Cardiovascular: RRR, S1/S2 +, no rubs, no gallops Respiratory: CTA bilaterally, no wheezing, no rhonchi Abdominal: Soft, NT, ND, bowel sounds + Extremities: no edema, no cyanosis   Discharge Instructions  Discharge Instructions    Diet - low sodium heart healthy   Complete by:  As directed    Renal and diabetic diet   Increase activity slowly   Complete by:  As directed      Allergies as of 07/03/2018   No Known Allergies     Medication List    STOP taking these medications   carvedilol 25 MG tablet Commonly known as:  COREG   furosemide 40 MG tablet Commonly known as:  LASIX   hydrochlorothiazide 12.5 MG tablet Commonly known as:  HYDRODIURIL   tiZANidine 4 MG tablet Commonly known as:  ZANAFLEX     TAKE these medications   acetaminophen 500 MG tablet Commonly known as:  TYLENOL Take 1 tablet (500 mg total) by mouth every 6 (six) hours as needed for mild pain, moderate pain, fever or headache.   alum & mag hydroxide-simeth 200-200-20 MG/5ML suspension Commonly known as:  MAALOX/MYLANTA Take 30 mLs by mouth every 6 (six) hours as needed for indigestion or heartburn.   amLODipine 10 MG tablet Commonly known as:  NORVASC Take 10 mg by mouth daily.   bisacodyl 10 MG suppository Commonly known as:  DULCOLAX Place 1 suppository (10 mg total) rectally daily as needed for moderate constipation.   Buprenorphine HCl-Naloxone HCl 8-2 MG Film Place 1 Film under the tongue 3 (three) times daily.    DULoxetine 30 MG capsule Commonly known as:  CYMBALTA Take 30 mg by mouth 2 (two) times daily.   folic acid 1 MG tablet Commonly known as:  FOLVITE Take 1 tablet (1 mg total) by mouth daily. Start taking on:  Jul 04, 2018   HumaLOG KwikPen 100 UNIT/ML KwikPen Generic drug:  insulin lispro Inject 45 Units into the skin daily. Inject into the skin as directed per sliding (max dose 45 units daily)   labetalol 200 MG tablet Commonly known as:  NORMODYNE Take 1 tablet (200 mg total) by mouth 2 (two) times daily.   Levemir FlexTouch 100 UNIT/ML Pen Generic drug:  Insulin Detemir Inject 25 Units into the skin See admin instructions. 25 units in the morning and evening   metoCLOPramide 5 MG tablet Commonly known as:  REGLAN Take 1 tablet (5 mg total) by mouth 3 (three) times daily before meals.   NovoFine 32G X 6 MM Misc Generic drug:  Insulin Pen Needle USE TO INJECT INSULIN FIVE TIMES DAILY   pantoprazole 40 MG tablet Commonly known as:  PROTONIX Take 40 mg by mouth daily.   promethazine 25 MG tablet Commonly known as:  PHENERGAN Take 1 tablet (25 mg total) by mouth every 8 (eight) hours as needed. What changed:  reasons to take this      Follow-up Information    Vyas, Dhruv B, MD Follow up in 1 week(s).   Specialty:  Internal Medicine Contact information: [place] [address] [phone]          No Known Allergies   Procedures/Studies:  Intubation/Extubatino NG tube  Dg Orthopantogram  Result Date: 06/30/2018 CLINICAL DATA:  Fever.  Possible infection. EXAM: ORTHOPANTOGRAM/PANORAMIC COMPARISON:  None. FINDINGS: No evidence for mandibular fracture. Temporomandibular joints appear located. Maxillary sinuses are clear. No evidence for period a coal lucency associated with any of the lower teeth. No definite lucency seen around the roots of the upper teeth although superimposed bony anatomy hinders assessment. IMPRESSION: No definite findings to  suggest periapical abscess. Electronically Signed   By: Misty Stanley M.D.   On: 06/30/2018 14:53   Dg Abd 1 View  Result Date: 07/02/2018 CLINICAL DATA:  Check gastric catheter placement EXAM: ABDOMEN - 1 VIEW COMPARISON:  07/01/2018 FINDINGS: Gastric catheter is now seen within the stomach. No free air is noted. IMPRESSION: Gastric catheter within the stomach. Electronically Signed   By: Inez Catalina M.D.   On: 07/02/2018 02:27   Dg Abd 1 View  Result Date: 07/01/2018 CLINICAL DATA:  Nausea and vomiting EXAM: ABDOMEN - 1 VIEW COMPARISON:  Jun 28, 2018 FINDINGS: There is fairly diffuse stool throughout the colon. There is a paucity of bowel gas. There is no bowel dilatation or air-fluid level to suggest bowel obstruction. No free air. Surgical clips noted in right mid abdomen. Visualized lung bases clear. IMPRESSION: Fairly diffuse stool throughout colon. Paucity of bowel gas noted. While this finding may be seen normally, it also may be indicative of ileus or enteritis. Bowel obstruction felt to be less likely. No free air. Electronically Signed   By: Lowella Grip III M.D.   On: 07/01/2018 22:27   Ct Head Wo Contrast  Result Date: 06/26/2018 CLINICAL DATA:  33 year old male found down after seizure like activity. Head trauma. EXAM: CT HEAD WITHOUT CONTRAST CT CERVICAL SPINE WITHOUT CONTRAST TECHNIQUE: Multidetector CT imaging of the head and cervical spine was performed following the standard protocol without intravenous contrast. Multiplanar CT image reconstructions of the cervical spine were also generated. COMPARISON:  None. FINDINGS: CT HEAD FINDINGS Brain: Prominent lateral ventricle size with a degree of colpocephaly although the temporal horns remain fairly diminutive. Third and especially 4th ventricle size is within normal limits. No evidence of transependymal edema. No midline shift, mass effect, or evidence of intracranial mass lesion. Normal basilar cisterns. No acute intracranial  hemorrhage identified. Gray-white matter differentiation is within normal limits throughout the brain. No cortically based acute infarct identified. Vascular: No suspicious intracranial vascular hyperdensity. Skull: Intact. Sinuses/Orbits: Visualized paranasal sinuses are well pneumatized. The tympanic cavities are clear, but there is partial left mastoid opacification with chronic  appearing mastoid coalescence and sclerosis. The right mastoids are clear. Other: Intubated on the scout view with fluid in the pharynx. Orbit and scalp soft tissues appear within normal limits. CT CERVICAL SPINE FINDINGS Alignment: Normal cervical lordosis. Cervicothoracic junction alignment is within normal limits. Bilateral posterior element alignment is within normal limits. Skull base and vertebrae: Visualized skull base is intact. No atlanto-occipital dissociation. No acute osseous abnormality identified. Soft tissues and spinal canal: No prevertebral fluid or swelling. No visible canal hematoma. Endotracheal tube courses appropriately into the trachea, tip not included. Enteric tube courses into the esophagus. Negative noncontrast neck soft tissues. Disc levels:  Minor endplate spurring. Upper chest: Visible upper thoracic levels appear grossly intact. Partially visible dual lumen right IJ approach catheter. There is confluent dependent opacity in the left lung apex on series 8, image 95. Negative right lung apex. IMPRESSION: 1. No acute traumatic injury identified in the head or cervical spine. 2. Ventricular prominence with Colpocephaly, but otherwise negative noncontrast CT appearance of the brain. 3. Dependent opacity in the left upper lobe. Favor atelectasis over aspiration or pneumonia. 4. Visible ET and enteric tubes placed as expected. 5. Chronic postinflammatory changes of the left mastoid air cells. Electronically Signed   By: Genevie Ann M.D.   On: 06/26/2018 22:50   Ct Cervical Spine Wo Contrast  Result Date:  06/26/2018 CLINICAL DATA:  32 year old male found down after seizure like activity. Head trauma. EXAM: CT HEAD WITHOUT CONTRAST CT CERVICAL SPINE WITHOUT CONTRAST TECHNIQUE: Multidetector CT imaging of the head and cervical spine was performed following the standard protocol without intravenous contrast. Multiplanar CT image reconstructions of the cervical spine were also generated. COMPARISON:  None. FINDINGS: CT HEAD FINDINGS Brain: Prominent lateral ventricle size with a degree of colpocephaly although the temporal horns remain fairly diminutive. Third and especially 4th ventricle size is within normal limits. No evidence of transependymal edema. No midline shift, mass effect, or evidence of intracranial mass lesion. Normal basilar cisterns. No acute intracranial hemorrhage identified. Gray-white matter differentiation is within normal limits throughout the brain. No cortically based acute infarct identified. Vascular: No suspicious intracranial vascular hyperdensity. Skull: Intact. Sinuses/Orbits: Visualized paranasal sinuses are well pneumatized. The tympanic cavities are clear, but there is partial left mastoid opacification with chronic appearing mastoid coalescence and sclerosis. The right mastoids are clear. Other: Intubated on the scout view with fluid in the pharynx. Orbit and scalp soft tissues appear within normal limits. CT CERVICAL SPINE FINDINGS Alignment: Normal cervical lordosis. Cervicothoracic junction alignment is within normal limits. Bilateral posterior element alignment is within normal limits. Skull base and vertebrae: Visualized skull base is intact. No atlanto-occipital dissociation. No acute osseous abnormality identified. Soft tissues and spinal canal: No prevertebral fluid or swelling. No visible canal hematoma. Endotracheal tube courses appropriately into the trachea, tip not included. Enteric tube courses into the esophagus. Negative noncontrast neck soft tissues. Disc levels:  Minor  endplate spurring. Upper chest: Visible upper thoracic levels appear grossly intact. Partially visible dual lumen right IJ approach catheter. There is confluent dependent opacity in the left lung apex on series 8, image 95. Negative right lung apex. IMPRESSION: 1. No acute traumatic injury identified in the head or cervical spine. 2. Ventricular prominence with Colpocephaly, but otherwise negative noncontrast CT appearance of the brain. 3. Dependent opacity in the left upper lobe. Favor atelectasis over aspiration or pneumonia. 4. Visible ET and enteric tubes placed as expected. 5. Chronic postinflammatory changes of the left mastoid air cells. Electronically Signed  By: Genevie Ann M.D.   On: 06/26/2018 22:50   Mr Brain Wo Contrast  Result Date: 06/29/2018 CLINICAL DATA:  33 y/o M; history of seizure. Episode of loss of consciousness. EXAM: MRI HEAD WITHOUT CONTRAST TECHNIQUE: Axial DWI, coronal DWI, and sagittal T1 weighted sequences were acquired. Patient was unable to tolerate additional sequences. COMPARISON:  06/26/2018 CT of the head. FINDINGS: On the diffusion weighted sequence there is a punctate focus of reduced diffusion within the right anterior temporal lobe white matter (series 5, image 63). No additional diffusion signal abnormality is evident. Stable ventriculomegaly. Motion degraded sagittal T1 weighted sequence. No gross mass effect is evident. IMPRESSION: Limited examination. Punctate focus of reduced diffusion in the right anterior temporal white matter likely representing a small vessel acute/early subacute infarction. Pattern is atypical for seizure related activity. These results were called by telephone at the time of interpretation on 06/29/2018 at 3:59 am to Dr. Ignatius Specking , who verbally acknowledged these results. Electronically Signed   By: Kristine Garbe M.D.   On: 06/29/2018 04:02   Dg Chest Port 1 View  Result Date: 06/28/2018 CLINICAL DATA:  Respiratory failure. EXAM:  PORTABLE CHEST 1 VIEW COMPARISON:  Radiograph Jun 27, 2018. FINDINGS: The heart size and mediastinal contours are within normal limits. Right internal jugular dialysis catheter is unchanged in position. No pneumothorax or pleural effusion is noted. Lungs are clear. The visualized skeletal structures are unremarkable. IMPRESSION: No active disease. Electronically Signed   By: Marijo Conception M.D.   On: 06/28/2018 08:27   Dg Chest Port 1 View  Result Date: 06/27/2018 CLINICAL DATA:  Fever and hypertension EXAM: PORTABLE CHEST 1 VIEW COMPARISON:  06/26/2018 FINDINGS: Endotracheal tube and nasogastric catheter have been removed. Dialysis catheter is again seen in satisfactory position. Lungs are well aerated bilaterally. Mild right basilar atelectasis is seen. No acute bony abnormality is noted. Cardiac shadow is within normal limits. IMPRESSION: Mild right basilar atelectasis. Electronically Signed   By: Inez Catalina M.D.   On: 06/27/2018 12:35   Dg Chest Portable 1 View  Result Date: 06/26/2018 CLINICAL DATA:  33 year old male level 1 trauma, head injury. Intubated. EXAM: PORTABLE CHEST 1 VIEW COMPARISON:  None. FINDINGS: Portable AP supine view at 2202 hours. Endotracheal tube tip in good position at the level the clavicles. Enteric tube courses to the left abdomen, tip not included. Superimposed right chest tunneled type dual lumen dialysis catheter. The patient is rotated to the left. Low lung volumes. Normal cardiac size and mediastinal contours. Allowing for portable technique the lungs are clear. Paucity bowel gas in the upper abdomen. No acute osseous abnormality identified. IMPRESSION: 1. ET tube tip in good position. Enteric tube courses to the left abdomen, tip not included. Right chest dialysis type catheter also in place. 2. No acute cardiopulmonary abnormality. Electronically Signed   By: Genevie Ann M.D.   On: 06/26/2018 22:30   Dg Abd Portable 1v  Result Date: 06/28/2018 CLINICAL DATA:   Abdominal pain EXAM: PORTABLE ABDOMEN - 1 VIEW COMPARISON:  None. FINDINGS: There is no appreciable bowel dilatation or air-fluid level to suggest bowel obstruction. No free air. There are surgical clips in the right abdomen. IMPRESSION: No bowel obstruction or free air evident. Electronically Signed   By: Lowella Grip III M.D.   On: 06/28/2018 16:44     The results of significant diagnostics from this hospitalization (including imaging, microbiology, ancillary and laboratory) are listed below for reference.     Microbiology: Recent  Results (from the past 240 hour(s))  SARS Coronavirus 2 (CEPHEID- Performed in [facility] hospital lab), Hosp Order     Status: None   Collection Time: 06/26/18 10:50 PM  Result Value Ref Range Status   SARS Coronavirus 2 NEGATIVE NEGATIVE Final    Comment: (NOTE) If result is NEGATIVE SARS-CoV-2 target nucleic acids are NOT DETECTED. The SARS-CoV-2 RNA is generally detectable in upper and lower  respiratory specimens during the acute phase of infection. The lowest  concentration of SARS-CoV-2 viral copies this assay can detect is 250  copies / mL. A negative result does not preclude SARS-CoV-2 infection  and should not be used as the sole basis for treatment or other  patient management decisions.  A negative result may occur with  improper specimen collection / handling, submission of specimen other  than nasopharyngeal swab, presence of viral mutation(s) within the  areas targeted by this assay, and inadequate number of viral copies  (<250 copies / mL). A negative result must be combined with clinical  observations, patient history, and epidemiological information. If result is POSITIVE SARS-CoV-2 target nucleic acids are DETECTED. The SARS-CoV-2 RNA is generally detectable in upper and lower  respiratory specimens dur ing the acute phase of infection.  Positive  results are indicative of active infection with SARS-CoV-2.  Clinical  correlation  with patient history and other diagnostic information is  necessary to determine patient infection status.  Positive results do  not rule out bacterial infection or co-infection with other viruses. If result is PRESUMPTIVE POSTIVE SARS-CoV-2 nucleic acids MAY BE PRESENT.   A presumptive positive result was obtained on the submitted specimen  and confirmed on repeat testing.  While 2019 novel coronavirus  (SARS-CoV-2) nucleic acids may be present in the submitted sample  additional confirmatory testing may be necessary for epidemiological  and / or clinical management purposes  to differentiate between  SARS-CoV-2 and other Sarbecovirus currently known to infect humans.  If clinically indicated additional testing with an alternate test  methodology [phone]) is advised. The SARS-CoV-2 RNA is generally  detectable in upper and lower respiratory sp ecimens during the acute  phase of infection. The expected result is Negative. Fact Sheet for Patients:  StrictlyIdeas.no Fact Sheet for Healthcare Providers: BankingDealers.co.za This test is not yet approved or cleared by the [place] FDA and has been authorized for detection and/or diagnosis of SARS-CoV-2 by FDA under an Emergency Use Authorization (EUA).  This EUA will remain in effect (meaning this test can be used) for the duration of the COVID-19 declaration under Section 564(b)(1) of the Act, 21 U.S.C. section 360bbb-3(b)(1), unless the authorization is terminated or revoked sooner. Performed at [facility], [place] [address] [place], [place] [address]   Culture, blood (Routine X 2) w Reflex to ID Panel     Status: None   Collection Time: 06/27/18 12:52 PM  Result Value Ref Range Status   Specimen Description BLOOD LEFT ANTECUBITAL  Final   Special Requests   Final    BOTTLES DRAWN AEROBIC ONLY Blood Culture adequate volume   Culture   Final    NO GROWTH 5 DAYS Performed at  [facility], [place] [address], [place], [place] [address]    Report Status 07/02/2018 FINAL  Final  Culture, blood (Routine X 2) w Reflex to ID Panel     Status: None   Collection Time: 06/27/18  1:00 PM  Result Value Ref Range Status   Specimen Description BLOOD  RIGHT HAND  Final   Special Requests   Final    BOTTLES DRAWN AEROBIC ONLY Blood Culture adequate volume   Culture   Final    NO GROWTH 5 DAYS Performed at [facility], [address] [place], [place] [address]    Report Status 07/02/2018 FINAL  Final  MRSA PCR Screening     Status: None   Collection Time: 06/27/18  1:06 PM  Result Value Ref Range Status   MRSA by PCR NEGATIVE NEGATIVE Final    Comment:        The GeneXpert MRSA Assay (FDA approved for NASAL specimens only), is one component of a comprehensive MRSA colonization surveillance program. It is not intended to diagnose MRSA infection nor to guide or monitor treatment for MRSA infections. Performed at [facility], [place] [address], [place], [place] [address]   CSF culture with Stat gram stain     Status: None   Collection Time: 06/27/18  2:49 PM  Result Value Ref Range Status   Specimen Description CSF  Final   Special Requests NONE  Final   Gram Stain   Final    WBC PRESENT, PREDOMINANTLY PMN NO ORGANISMS SEEN CYTOSPIN SMEAR    Culture   Final    NO GROWTH 3 DAYS Performed at [facility], [place] [address] [place], [place] [address]    Report Status 06/30/2018 FINAL  Final  Fungus Culture With Stain     Status: None (Preliminary result)   Collection Time: 06/27/18  2:49 PM  Result Value Ref Range Status   Fungus Stain Final report  Final    Comment: (NOTE) Performed At: [facility] [place], [place] [id number] Rush Farmer MD TD:[id number]    Fungus (Mycology) Culture PENDING  Incomplete   Fungal Source CSF  Final    Comment: Performed at [facility], [place] [address], [place], [place]  [address]  Fungus Culture Result     Status: None   Collection Time: 06/27/18  2:49 PM  Result Value Ref Range Status   Result 1 Comment  Final    Comment: (NOTE) KOH/Calcofluor preparation:  no fungus observed. Performed At: [facility] [place], [place] [id number] Rush Farmer MD [id number]      Labs: BNP (last 3 results) No results for input(s): BNP in the last 8760 hours. Basic Metabolic Panel: Recent Labs  Lab 06/27/18 0509 06/27/18 1550 06/28/18 0744 06/29/18 0418 07/02/18 0723 07/03/18 0724  NA 135 143 136 135 134* 130*  K 6.6* 5.1 5.2* 4.9 3.1* 4.0  CL 99 106 98 96* 85* 89*  CO2 19* 22 20* 19* 34* 26  GLUCOSE 470* 80 143* 245* 93 96  BUN 58* 66* 33* 47* 55* 42*  CREATININE 7.75* 9.00* 6.01* 7.97* 9.23* 8.13*  CALCIUM 9.3 9.6 9.8 9.8 10.2 9.9  MG 2.0  --   --   --   --   --   PHOS 6.5*  --   --   --  8.9*  --    Liver Function Tests: Recent Labs  Lab 06/26/18 2200 07/02/18 0723  AST 23  --   ALT 19  --   ALKPHOS 83  --   BILITOT 0.7  --   PROT 7.5  --   ALBUMIN 4.3 4.0   No results for input(s): LIPASE, AMYLASE in the last 168 hours. No results for input(s): AMMONIA in the last 168 hours.  CBC: Recent Labs  Lab 06/26/18 2200 06/26/18 2320 06/27/18 0509 06/28/18 0744 06/29/18 0418 07/02/18 0723  WBC 12.6*  --  21.2* 15.5* 12.8* 11.8*  NEUTROABS 8.2*  --   --   --   --   --   HGB 12.2* 11.9* 12.3* 13.2 13.6 14.0  HCT 38.0* 35.0* 38.3* 40.4 41.0 42.9  MCV 93.8  --  94.6 91.8 91.1 91.1  PLT 284  --  245 276 269 330   Cardiac Enzymes: Recent Labs  Lab 06/26/18 2200 06/27/18 0509  CKTOTAL 201 221   BNP: Invalid input(s): POCBNP CBG: Recent Labs  Lab 07/02/18 1733 07/02/18 2129 07/02/18 2254 07/03/18 0613 07/03/18 1056  GLUCAP 360* 449* 340* 113* 95   D-Dimer No results for input(s): DDIMER in the last 72 hours. Hgb A1c No results for input(s): HGBA1C in the last 72 hours. Lipid Profile No results for  input(s): CHOL, HDL, LDLCALC, TRIG, CHOLHDL, LDLDIRECT in the last 72 hours. Thyroid function studies No results for input(s): TSH, T4TOTAL, T3FREE, THYROIDAB in the last 72 hours.  Invalid input(s): FREET3 Anemia work up No results for input(s): VITAMINB12, FOLATE, FERRITIN, TIBC, IRON, RETICCTPCT in the last 72 hours. Urinalysis    Component Value Date/Time   COLORURINE STRAW (A) 06/27/2018 0509   APPEARANCEUR CLEAR 06/27/2018 0509   LABSPEC 1.010 06/27/2018 0509   PHURINE 7.0 06/27/2018 0509   GLUCOSEU >=500 (A) 06/27/2018 0509   HGBUR SMALL (A) 06/27/2018 0509   BILIRUBINUR NEGATIVE 06/27/2018 0509   KETONESUR 5 (A) 06/27/2018 0509   PROTEINUR >=300 (A) 06/27/2018 0509   NITRITE NEGATIVE 06/27/2018 0509   LEUKOCYTESUR NEGATIVE 06/27/2018 0509   Sepsis Labs Invalid input(s): PROCALCITONIN,  WBC,  LACTICIDVEN Microbiology Recent Results (from the past 240 hour(s))  SARS Coronavirus 2 (CEPHEID- Performed in [facility] lab), Hosp Order     Status: None   Collection Time: 06/26/18 10:50 PM  Result Value Ref Range Status   SARS Coronavirus 2 NEGATIVE NEGATIVE Final    Comment: (NOTE) If result is NEGATIVE SARS-CoV-2 target nucleic acids are NOT DETECTED. The SARS-CoV-2 RNA is generally detectable in upper and lower  respiratory specimens during the acute phase of infection. The lowest  concentration of SARS-CoV-2 viral copies this assay can detect is 250  copies / mL. A negative result does not preclude SARS-CoV-2 infection  and should not be used as the sole basis for treatment or other  patient management decisions.  A negative result may occur with  improper specimen collection / handling, submission of specimen other  than nasopharyngeal swab, presence of viral mutation(s) within the  areas targeted by this assay, and inadequate number of viral copies  (<250 copies / mL). A negative result must be combined with clinical  observations, patient history, and  epidemiological information. If result is POSITIVE SARS-CoV-2 target nucleic acids are DETECTED. The SARS-CoV-2 RNA is generally detectable in upper and lower  respiratory specimens dur ing the acute phase of infection.  Positive  results are indicative of active infection with SARS-CoV-2.  Clinical  correlation with patient history and other diagnostic information is  necessary to determine patient infection status.  Positive results do  not rule out bacterial infection or co-infection with other viruses. If result is PRESUMPTIVE POSTIVE SARS-CoV-2 nucleic acids MAY BE PRESENT.   A presumptive positive result was obtained on the submitted specimen  and confirmed on repeat testing.  While 2019 novel coronavirus  (SARS-CoV-2) nucleic acids may be present in the  submitted sample  additional confirmatory testing may be necessary for epidemiological  and / or clinical management purposes  to differentiate between  SARS-CoV-2 and other Sarbecovirus currently known to infect humans.  If clinically indicated additional testing with an alternate test  methodology [phone]) is advised. The SARS-CoV-2 RNA is generally  detectable in upper and lower respiratory sp ecimens during the acute  phase of infection. The expected result is Negative. Fact Sheet for Patients:  StrictlyIdeas.no Fact Sheet for Healthcare Providers: BankingDealers.co.za This test is not yet approved or cleared by the [place] FDA and has been authorized for detection and/or diagnosis of SARS-CoV-2 by FDA under an Emergency Use Authorization (EUA).  This EUA will remain in effect (meaning this test can be used) for the duration of the COVID-19 declaration under Section 564(b)(1) of the Act, 21 U.S.C. section 360bbb-3(b)(1), unless the authorization is terminated or revoked sooner. Performed at [facility], [place] [address], [place], [place] [address]   Culture,  blood (Routine X 2) w Reflex to ID Panel     Status: None   Collection Time: 06/27/18 12:52 PM  Result Value Ref Range Status   Specimen Description BLOOD LEFT ANTECUBITAL  Final   Special Requests   Final    BOTTLES DRAWN AEROBIC ONLY Blood Culture adequate volume   Culture   Final    NO GROWTH 5 DAYS Performed at [facility], [place] [address], [place], [place] [address]    Report Status 07/02/2018 FINAL  Final  Culture, blood (Routine X 2) w Reflex to ID Panel     Status: None   Collection Time: 06/27/18  1:00 PM  Result Value Ref Range Status   Specimen Description BLOOD RIGHT HAND  Final   Special Requests   Final    BOTTLES DRAWN AEROBIC ONLY Blood Culture adequate volume   Culture   Final    NO GROWTH 5 DAYS Performed at [facility], [place] [address] [place], [place] [address]    Report Status 07/02/2018 FINAL  Final  MRSA PCR Screening     Status: None   Collection Time: 06/27/18  1:06 PM  Result Value Ref Range Status   MRSA by PCR NEGATIVE NEGATIVE Final    Comment:        The GeneXpert MRSA Assay (FDA approved for NASAL specimens only), is one component of a comprehensive MRSA colonization surveillance program. It is not intended to diagnose MRSA infection nor to guide or monitor treatment for MRSA infections. Performed at [facility], [place] [address], [place], [place] [address]   CSF culture with Stat gram stain     Status: None   Collection Time: 06/27/18  2:49 PM  Result Value Ref Range Status   Specimen Description CSF  Final   Special Requests NONE  Final   Gram Stain   Final    WBC PRESENT, PREDOMINANTLY PMN NO ORGANISMS SEEN CYTOSPIN SMEAR    Culture   Final    NO GROWTH 3 DAYS Performed at [facility], [place] [address], [place], [place] [address]    Report Status 06/30/2018 FINAL  Final  Fungus Culture With Stain     Status: None (Preliminary result)   Collection Time: 06/27/18  2:49 PM  Result Value Ref Range Status    Fungus Stain Final report  Final    Comment: (NOTE) Performed At: [facility] [place], [place] [id number] Rush Farmer MD [id number]  Fungus (Mycology) Culture PENDING  Incomplete   Fungal Source CSF  Final    Comment: Performed at [facility], [place] [address] [place], [place] [address]  Fungus Culture Result     Status: None   Collection Time: 06/27/18  2:49 PM  Result Value Ref Range Status   Result 1 Comment  Final    Comment: (NOTE) KOH/Calcofluor preparation:  no fungus observed. Performed At: [facility] [place], [place] [id number] Rush Farmer MD [id number]      Time coordinating discharge in minutes: 55  SIGNED:   Debbe Odea, MD  [facility] 07/03/2018, 2:01 PM Pager   If 7PM-7AM, please contact night-coverage [email or web address] Password [email or web address]

## 2018-07-03 NOTE — Progress Notes (Deleted)
PROGRESS NOTE    Brent Daniels   XKG:[id number]  DOB: 1985/07/21  DOA: 06/26/2018 PCP: Glenda Chroman, MD   Brief Narrative:  Brent Daniels is a 33 y/o wth ESRD on HD, DM, HTN, Seizure disorder. Noted to have a what was described to be a tonic clonic seizure at a friend's house and brought to ED.  SBP was 230s and 240s in ED.   Noted to be confused and agitated and intubated for head CT. Subsequently extubated in ED. Admitted to critical care.   Per history obtained by neuro from mother "she says that he was taking gabapentin for pain and because of his worsening renal function he started having some jerking movements which at times progressed to seizures.  They discontinued his gabapentin and start him on dialysis and the seizures did not recur after that.  He is never seen a neurologist for that."   Noted to be persistently confused and agitated in ICU and then developed a fever of 102. LP done on 5/21 and started on Vanc, Ceftriaxone and Acylcovir. Given Cleviprex for HTN initially. EEG done on 5/21- showed generalized background slowing UDS + for Cocaine and Benzos  Fever 101 on 5/22 ID consulted on 5/23 - IV antibiotics d/c'd, Acyclovir continued, Panorex ordered    Subjective: Angry and asking for solid food. He has no other complaints.     Assessment & Plan:   Principal Problem:  Acute toxic Encephalopathy   - ? Due to drug (UDS + for Cocaine and Benzoss), hypertensive emergency, non compliance with dialysis - MRI brain 5/23: Limited exam due to lack of cooperation.  Reported findings suggestive of small vessel acute/early subacute infarction in the right anterior temporal white matter.  Dr. Rory Percy did not feel it was a significant finding. - CSF 5/21: WBC 113, segmented neutrophils 95.  Glucose 133 and CSF protein 34. - CSF culture: Negative to date. -  Blood cultures x2: Negative to date. - RPR negative and cryptococcal antigen negative.  Active Problems:  Seizures? 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 - started on Labetalol - currently on 300 mg twice daily  - home Carvedilol still on hold due to cocaine abuse history- should not resume - Resumed home amlodipine 10 mg daily - BP slightly low-will cut back on Labetalol to 200 BID -   home Lasix and HCTZ have not been resumed     FUO (fever of unknown origin)  - neg CXR and Panorex - HSV 1 and 2 on CSF negative - antibiotics stopped on 5/25- no recurrence of fevers    ESRD (end stage renal disease) on dialysis  - reported to be non compliant    Diabetes type 2, uncontrolled - A1c 8.8 - cont Levemir and Novolog SSI    Substance abuse- benzo and Cocaine + on UDS - received Ativan in ED which could account for the Benzos on the UDS  Chronic pain - on Suboxone per Dr Lonell Grandchild in [place]  Vomiting and abdominal pain - ? opoid withdrawal- may also have gastroparesis- he declined and EGD in 2017 - GI consulted on 5/26 and NG removed, Reglan added - cont PPI       Time spent in minutes: 61- majority of time spent on reviewing chart DVT prophylaxis: Heparin Code Status:  Full code Family Communication:  Disposition Plan: advance diet today Consultants:   GI  Nephrology  ID  PCCM Procedures:   LP EEG> This is an abnormal electroencephalogram characterized by general background slowing with superimposed beta activity consistent with current medications.  No epileptiform activity is noted.     Antimicrobials:  Anti-infectives (From admission, onward)   Start     Dose/Rate Route Frequency Ordered Stop   06/28/18 2200  acyclovir (ZOVIRAX) 375 mg in dextrose 5 % 100 mL IVPB  Status:  Discontinued     5 mg/kg  74.9 kg 107.5 mL/hr over 60 Minutes Intravenous Every 24 hours 06/28/18  1023 07/01/18 0941   06/27/18 2000  vancomycin (VANCOCIN) IVPB 750 mg/150 ml premix  Status:  Discontinued     750 mg 150 mL/hr over 60 Minutes Intravenous  Once 06/27/18 1520 06/28/18 1010   06/27/18 1700  acyclovir (ZOVIRAX) 765 mg in dextrose 5 % 150 mL IVPB  Status:  Discontinued     10 mg/kg  76.6 kg 165.3 mL/hr over 60 Minutes Intravenous Every 24 hours 06/27/18 1629 06/28/18 1023   06/27/18 1645  cefTRIAXone (ROCEPHIN) 2 g in sodium chloride 0.9 % 100 mL IVPB  Status:  Discontinued     2 g 200 mL/hr over 30 Minutes Intravenous Every 12 hours 06/27/18 1454 06/29/18 1128   06/27/18 1600  vancomycin (VANCOCIN) 1,500 mg in sodium chloride 0.9 % 500 mL IVPB     1,500 mg 250 mL/hr over 120 Minutes Intravenous  Once 06/27/18 1520 06/27/18 1957   06/27/18 1520  vancomycin variable dose per unstable renal function (pharmacist dosing)  Status:  Discontinued      Does not apply See admin instructions 06/27/18 1520 06/29/18 1128   06/27/18 1500  Ampicillin-Sulbactam (UNASYN) 3 g in sodium chloride 0.9 % 100 mL IVPB  Status:  Discontinued     3 g 200 mL/hr over 30 Minutes Intravenous Every 24 hours 06/27/18 1356 06/27/18 1440   06/27/18 1500  cefTRIAXone (ROCEPHIN) 1 g in sodium chloride 0.9 % 100 mL IVPB  Status:  Discontinued     1 g 200 mL/hr over 30 Minutes Intravenous Every 12 hours 06/27/18 1450 06/27/18 1454   06/27/18 1445  cefTRIAXone (ROCEPHIN) injection 1 g  Status:  Discontinued     1 g Intramuscular Every 24 hours 06/27/18 1440 06/27/18 1449       Objective: Vitals:   07/02/18 2321 07/03/18 0320 07/03/18 0500 07/03/18 0808  BP: 128/90 92/70  116/75  Pulse: 79 76  72  Resp: 18 14  15   Temp: 98.7 F (37.1 C) 97.8 F (36.6 C)  (!) 97.5 F (36.4 C)  TempSrc: Oral Oral  Oral  SpO2: 100% 100%  97%  Weight:   77 kg   Height:        Intake/Output Summary (Last 24 hours) at 07/03/2018 0834 Last data filed at 07/03/2018 0400 Gross per 24 hour  Intake 887.44 ml  Output 1500  ml  Net -612.56 ml   Filed Weights   07/02/18 0710 07/02/18 1035 07/03/18 0500  Weight: 78.9 kg 77.9 kg 77 kg    Examination: General exam: Appears comfortable  HEENT: PERRLA, oral mucosa moist, no sclera icterus or thrush Respiratory system: Clear to auscultation. Respiratory effort normal. Cardiovascular system: S1 & S2 heard, RRR.   Gastrointestinal system: Abdomen soft, non-tender, nondistended. Normal bowel sounds. Central nervous system: Alert and oriented. No focal neurological deficits. Extremities: No cyanosis, clubbing or edema Skin: No  rashes or ulcers Psychiatry:  Mood & affect appropriate.     Data Reviewed: I have personally reviewed following labs and imaging studies  CBC: Recent Labs  Lab 06/26/18 2200 06/26/18 2320 06/27/18 0509 06/28/18 0744 06/29/18 0418 07/02/18 0723  WBC 12.6*  --  21.2* 15.5* 12.8* 11.8*  NEUTROABS 8.2*  --   --   --   --   --   HGB 12.2* 11.9* 12.3* 13.2 13.6 14.0  HCT 38.0* 35.0* 38.3* 40.4 41.0 42.9  MCV 93.8  --  94.6 91.8 91.1 91.1  PLT 284  --  245 276 269 498   Basic Metabolic Panel: Recent Labs  Lab 06/27/18 0509 06/27/18 1550 06/28/18 0744 06/29/18 0418 07/02/18 0723 07/03/18 0724  NA 135 143 136 135 134* 130*  K 6.6* 5.1 5.2* 4.9 3.1* 4.0  CL 99 106 98 96* 85* 89*  CO2 19* 22 20* 19* 34* 26  GLUCOSE 470* 80 143* 245* 93 96  BUN 58* 66* 33* 47* 55* 42*  CREATININE 7.75* 9.00* 6.01* 7.97* 9.23* 8.13*  CALCIUM 9.3 9.6 9.8 9.8 10.2 9.9  MG 2.0  --   --   --   --   --   PHOS 6.5*  --   --   --  8.9*  --    GFR: Estimated Creatinine Clearance: 13.9 mL/min (A) (by C-G formula based on SCr of 8.13 mg/dL (H)). Liver Function Tests: Recent Labs  Lab 06/26/18 2200 07/02/18 0723  AST 23  --   ALT 19  --   ALKPHOS 83  --   BILITOT 0.7  --   PROT 7.5  --   ALBUMIN 4.3 4.0   No results for input(s): LIPASE, AMYLASE in the last 168 hours. No results for input(s): AMMONIA in the last 168 hours. Coagulation  Profile: Recent Labs  Lab 06/26/18 2200  INR 1.1   Cardiac Enzymes: Recent Labs  Lab 06/26/18 2200 06/27/18 0509  CKTOTAL 201 221   BNP (last 3 results) No results for input(s): PROBNP in the last 8760 hours. HbA1C: No results for input(s): HGBA1C in the last 72 hours. CBG: Recent Labs  Lab 07/02/18 1132 07/02/18 1733 07/02/18 2129 07/02/18 2254 07/03/18 0613  GLUCAP 131* 360* 449* 340* 113*   Lipid Profile: No results for input(s): CHOL, HDL, LDLCALC, TRIG, CHOLHDL, LDLDIRECT in the last 72 hours. Thyroid Function Tests: No results for input(s): TSH, T4TOTAL, FREET4, T3FREE, THYROIDAB in the last 72 hours. Anemia Panel: No results for input(s): VITAMINB12, FOLATE, FERRITIN, TIBC, IRON, RETICCTPCT in the last 72 hours. Urine analysis:    Component Value Date/Time   COLORURINE STRAW (A) 06/27/2018 0509   APPEARANCEUR CLEAR 06/27/2018 0509   LABSPEC 1.010 06/27/2018 0509   PHURINE 7.0 06/27/2018 0509   GLUCOSEU >=500 (A) 06/27/2018 0509   HGBUR SMALL (A) 06/27/2018 0509   BILIRUBINUR NEGATIVE 06/27/2018 0509   KETONESUR 5 (A) 06/27/2018 0509   PROTEINUR >=300 (A) 06/27/2018 0509   NITRITE NEGATIVE 06/27/2018 0509   LEUKOCYTESUR NEGATIVE 06/27/2018 0509   Sepsis Labs: @LABRCNTIP (procalcitonin:4,lacticidven:4) ) Recent Results (from the past 240 hour(s))  SARS Coronavirus 2 (CEPHEID- Performed in [facility] hospital lab), Hosp Order     Status: None   Collection Time: 06/26/18 10:50 PM  Result Value Ref Range Status   SARS Coronavirus 2 NEGATIVE NEGATIVE Final    Comment: (NOTE) If result is NEGATIVE SARS-CoV-2 target nucleic acids are NOT DETECTED. The SARS-CoV-2 RNA is generally detectable in upper and lower  respiratory specimens during the acute phase of infection. The lowest  concentration of SARS-CoV-2 viral copies this assay can detect is 250  copies / mL. A negative result does not preclude SARS-CoV-2 infection  and should not be used as the sole  basis for treatment or other  patient management decisions.  A negative result may occur with  improper specimen collection / handling, submission of specimen other  than nasopharyngeal swab, presence of viral mutation(s) within the  areas targeted by this assay, and inadequate number of viral copies  (<250 copies / mL). A negative result must be combined with clinical  observations, patient history, and epidemiological information. If result is POSITIVE SARS-CoV-2 target nucleic acids are DETECTED. The SARS-CoV-2 RNA is generally detectable in upper and lower  respiratory specimens dur ing the acute phase of infection.  Positive  results are indicative of active infection with SARS-CoV-2.  Clinical  correlation with patient history and other diagnostic information is  necessary to determine patient infection status.  Positive results do  not rule out bacterial infection or co-infection with other viruses. If result is PRESUMPTIVE POSTIVE SARS-CoV-2 nucleic acids MAY BE PRESENT.   A presumptive positive result was obtained on the submitted specimen  and confirmed on repeat testing.  While 2019 novel coronavirus  (SARS-CoV-2) nucleic acids may be present in the submitted sample  additional confirmatory testing may be necessary for epidemiological  and / or clinical management purposes  to differentiate between  SARS-CoV-2 and other Sarbecovirus currently known to infect humans.  If clinically indicated additional testing with an alternate test  methodology [phone]) is advised. The SARS-CoV-2 RNA is generally  detectable in upper and lower respiratory sp ecimens during the acute  phase of infection. The expected result is Negative. Fact Sheet for Patients:  StrictlyIdeas.no Fact Sheet for Healthcare Providers: BankingDealers.co.za This test is not yet approved or cleared by the [place] FDA and has been authorized for detection  and/or diagnosis of SARS-CoV-2 by FDA under an Emergency Use Authorization (EUA).  This EUA will remain in effect (meaning this test can be used) for the duration of the COVID-19 declaration under Section 564(b)(1) of the Act, 21 U.S.C. section 360bbb-3(b)(1), unless the authorization is terminated or revoked sooner. Performed at [facility], [place] [address], [place], [place] [address]   Culture, blood (Routine X 2) w Reflex to ID Panel     Status: None   Collection Time: 06/27/18 12:52 PM  Result Value Ref Range Status   Specimen Description BLOOD LEFT ANTECUBITAL  Final   Special Requests   Final    BOTTLES DRAWN AEROBIC ONLY Blood Culture adequate volume   Culture   Final    NO GROWTH 5 DAYS Performed at [facility], [place] [address], [place], [place] [address]    Report Status 07/02/2018 FINAL  Final  Culture, blood (Routine X 2) w Reflex to ID Panel     Status: None   Collection Time: 06/27/18  1:00 PM  Result Value Ref Range Status   Specimen Description BLOOD RIGHT HAND  Final   Special Requests   Final    BOTTLES DRAWN AEROBIC ONLY Blood Culture adequate volume   Culture   Final    NO GROWTH 5 DAYS Performed at [facility], [place] [address] [place],  [address]    Report Status 07/02/2018 FINAL  Final  MRSA PCR Screening     Status: None   Collection Time: 06/27/18  1:06 PM  Result Value Ref Range Status   MRSA by PCR NEGATIVE NEGATIVE Final    Comment:        The GeneXpert MRSA Assay (FDA approved for NASAL specimens only), is one component of a comprehensive MRSA colonization surveillance program. It is not intended to diagnose MRSA infection nor to guide or monitor treatment for MRSA infections. Performed at [facility], [place] [address] [place], [place] [address]   CSF culture with Stat gram stain     Status: None   Collection Time: 06/27/18  2:49 PM  Result Value Ref Range Status   Specimen Description CSF  Final   Special  Requests NONE  Final   Gram Stain   Final    WBC PRESENT, PREDOMINANTLY PMN NO ORGANISMS SEEN CYTOSPIN SMEAR    Culture   Final    NO GROWTH 3 DAYS Performed at [facility], [place] [address], [place], [place] [address]    Report Status 06/30/2018 FINAL  Final  Fungus Culture With Stain     Status: None (Preliminary result)   Collection Time: 06/27/18  2:49 PM  Result Value Ref Range Status   Fungus Stain Final report  Final    Comment: (NOTE) Performed At: [facility] [place], [place] [id number] Rush Farmer MD IW:[id number]    Fungus (Mycology) Culture PENDING  Incomplete   Fungal Source CSF  Final    Comment: Performed at [facility], [place] [address], [place], [place] [address]  Fungus Culture Result     Status: None   Collection Time: 06/27/18  2:49 PM  Result Value Ref Range Status   Result 1 Comment  Final    Comment: (NOTE) KOH/Calcofluor preparation:  no fungus observed. Performed At: [facility] [place], [place] [id number] Rush Farmer MD [id number]          Radiology Studies: Dg Abd 1 View  Result Date: 07/02/2018 CLINICAL DATA:  Check gastric catheter placement EXAM: ABDOMEN - 1 VIEW COMPARISON:  07/01/2018 FINDINGS: Gastric catheter is now seen within the stomach. No free air is noted. IMPRESSION: Gastric catheter within the stomach. Electronically Signed   By: Inez Catalina M.D.   On: 07/02/2018 02:27   Dg Abd 1 View  Result Date: 07/01/2018 CLINICAL DATA:  Nausea and vomiting EXAM: ABDOMEN - 1 VIEW COMPARISON:  Jun 28, 2018 FINDINGS: There is fairly diffuse stool throughout the colon. There is a paucity of bowel gas. There is no bowel dilatation or air-fluid level to suggest bowel obstruction. No free air. Surgical clips noted in right mid abdomen. Visualized lung bases clear. IMPRESSION: Fairly diffuse stool throughout colon. Paucity of bowel gas noted. While this finding may be seen normally,  it also may be indicative of ileus or enteritis. Bowel obstruction felt to be less likely. No free air. Electronically Signed   By: Lowella Grip III M.D.   On: 07/01/2018 22:27      Scheduled Meds: . amLODipine  10 mg Oral Daily  . buprenorphine-naloxone  1 tablet Sublingual TID  . chlorhexidine  15 mL Mouth Rinse BID  . Chlorhexidine Gluconate Cloth  6 each Topical Q0600  . Chlorhexidine Gluconate Cloth  6 each Topical Q0600  . folic acid  1 mg Intravenous Daily  . heparin  5,000 Units Subcutaneous Q8H  . insulin aspart  0-5 Units Subcutaneous QHS  . insulin aspart  0-9 Units Subcutaneous TID WC  . insulin detemir  10 Units Subcutaneous Q12H  . labetalol  300 mg Oral BID  . mouth rinse  15 mL Mouth Rinse q12n4p  . metoCLOPramide (REGLAN) injection  10 mg Intravenous Q6H  . ondansetron (ZOFRAN) IV  4 mg Intravenous Q6H  . pantoprazole (PROTONIX) IV  40 mg Intravenous Q12H   Continuous Infusions: . sodium chloride    . sodium chloride Stopped (07/01/18 0958)  . sodium chloride 30 mL/hr at 07/02/18 2000     LOS: 7 days      Debbe Odea, MD [facility] Pager: [email or web address] Password [facility] 07/03/2018, 8:34 AM

## 2018-07-03 NOTE — Progress Notes (Signed)
Renal Navigator notified OP HD clinic/[facility] of patient's hospital discharge today. Renal Navigator faxed hospital discharge summary, today's Nephrology note and most recent HD treatment sheet to OP HD clinic to provide continuity of care and assist with smooth transition from hospital back to OP HD clinic.  Alphonzo Cruise, [place] Renal Navigator [phone]

## 2018-07-03 NOTE — Progress Notes (Addendum)
Patient ID: Brent Daniels, male   DOB: [birth year], 33 y.o.   MRN: [id number]    Progress Note   Subjective   Starving!!.. denies any abdominal pain,no nausea or vomiting    Objective   Vital signs in last 24 hours: Temp:  [97.5 F (36.4 C)-98.7 F (37.1 C)] 97.5 F (36.4 C) (05/27 0808) Pulse Rate:  [72-93] 72 (05/27 0808) Resp:  [12-18] 15 (05/27 0808) BP: (92-143)/(55-92) 116/75 (05/27 0808) SpO2:  [94 %-100 %] 97 % (05/27 0808) Weight:  [77 kg-77.9 kg] 77 kg (05/27 0500) Last BM Date: 06/28/18 General:    white male in NAD Heart:  Regular rate and rhythm; no murmurs Lungs: Respirations even and unlabored, lungs CTA bilaterally Abdomen:  Soft, nontender and nondistended. Normal bowel sounds. Extremities:  Without edema. Neurologic:  Alert and oriented,  grossly normal neurologically. Psych:  Cooperative. Normal mood and affect.  Intake/Output from previous day: 05/26 0701 - 05/27 0700 In: 887.4 [P.O.:462; I.V.:425.4] Out: 1500 [Urine:300] Intake/Output this shift: No intake/output data recorded.  Lab Results: Recent Labs    07/02/18 0723  WBC 11.8*  HGB 14.0  HCT 42.9  PLT 330   BMET Recent Labs    07/02/18 0723 07/03/18 0724  NA 134* 130*  K 3.1* 4.0  CL 85* 89*  CO2 34* 26  GLUCOSE 93 96  BUN 55* 42*  CREATININE 9.23* 8.13*  CALCIUM 10.2 9.9   LFT Recent Labs    07/02/18 0723  ALBUMIN 4.0   PT/INR No results for input(s): LABPROT, INR in the last 72 hours.  Studies/Results: Dg Abd 1 View  Result Date: 07/02/2018 CLINICAL DATA:  Check gastric catheter placement EXAM: ABDOMEN - 1 VIEW COMPARISON:  07/01/2018 FINDINGS: Gastric catheter is now seen within the stomach. No free air is noted. IMPRESSION: Gastric catheter within the stomach. Electronically Signed   By: Inez Catalina M.D.   On: 07/02/2018 02:27   Dg Abd 1 View  Result Date: 07/01/2018 CLINICAL DATA:  Nausea and vomiting EXAM: ABDOMEN - 1 VIEW COMPARISON:  Jun 28, 2018 FINDINGS:  There is fairly diffuse stool throughout the colon. There is a paucity of bowel gas. There is no bowel dilatation or air-fluid level to suggest bowel obstruction. No free air. Surgical clips noted in right mid abdomen. Visualized lung bases clear. IMPRESSION: Fairly diffuse stool throughout colon. Paucity of bowel gas noted. While this finding may be seen normally, it also may be indicative of ileus or enteritis. Bowel obstruction felt to be less likely. No free air. Electronically Signed   By: Lowella Grip III M.D.   On: 07/01/2018 22:27       Assessment / Plan:    #7  33 year old white male with intractable nausea and vomiting in the setting of insulin-dependent diabetes mellitus deviously diagnosed gastroparesis, opioid dependence and probable withdrawal.  Patient symptoms have completely resolved on current regimen and he is tolerating liquids without any difficulty. Suspect nausea and vomiting precipitated by opioid withdrawal-resolved now that he is back on Suboxone and certainly may have had a component of gastroparesis which was exacerbated by acute illness. 2.  End-stage renal disease on dialysis 3.  History of medical noncompliance 4.  History of substance abuse-drug screen positive for cocaine and benzos on admit #5 prolapse and seizure type episode precipitating admission on 06/26/2018 sequelae of confusion and agitation now resolved.  Extensive infectious work-up negative per neurology felt to have a toxic metabolic encephalopathy secondary to vasospasm from cocaine.  Plan; Will advance diet to carb modified.  Discussed lower roughage diet with patient which he seems familiar with. DC IV Protonix and convert to a grams p.o. every morning Discontinue IV Reglan and change to 5 mg 1/2-hour AC Change Zofran to 4 mg IV every 6 hours as needed-be discharged with prescription for either Zofran or Phenergan which she has used previously  If patient tolerates p.o.'s, okay to discharge  from GI standpoint. GI will sign off, please call for questions or problems.   LOS: 7 days   Amy Esterwood  07/03/2018, 9:56 AM  GI ATTENDING  Interval history data reviewed.  Patient seen and examined.  Agree with interval progress note as outlined above including recommendations for diet advancement and medication modification. Nothing further to add from a GI perspective.  No GI outpatient follow-up is required or recommended.  We will sign off.  Docia Chuck. Geri Seminole., M.D. [facility] Division of Gastroenterology

## 2018-07-04 ENCOUNTER — Encounter (HOSPITAL_COMMUNITY): Payer: Self-pay | Admitting: Interventional Radiology

## 2018-07-26 LAB — FUNGUS CULTURE RESULT

## 2018-07-26 LAB — FUNGUS CULTURE WITH STAIN

## 2018-08-12 ENCOUNTER — Other Ambulatory Visit: Payer: Self-pay

## 2018-08-12 DIAGNOSIS — N185 Chronic kidney disease, stage 5: Secondary | ICD-10-CM

## 2018-08-13 ENCOUNTER — Telehealth (HOSPITAL_COMMUNITY): Payer: Self-pay | Admitting: *Deleted

## 2018-08-13 NOTE — Telephone Encounter (Signed)
The above patient or their representative was contacted and gave the following answers to these questions:         Do you have any of the following symptoms?n  Fever                    Cough                   Shortness of breath  Do  you have any of the following other symptoms? n   muscle pain         vomiting,        diarrhea        rash         weakness        red eye        abdominal pain         bruising          bruising or bleeding              joint pain           severe headache    Have you been in contact with someone who was or has been sick in the past 2 weeks?n  Yes                 Unsure                         Unable to assess   Does the person that you were in contact with have any of the following symptoms?   Cough         shortness of breath           muscle pain         vomiting,            diarrhea            rash            weakness           fever            red eye           abdominal pain           bruising  or  bleeding                joint pain                severe headache               Have you  or someone you have been in contact with traveled internationally in th last month?   n      If yes, which countries?   Have you  or someone you have been in contact with traveled outside [place] in th last month?   n      If yes, which state and city?   COMMENTS OR ACTION PLAN FOR THIS PATIENT:         

## 2018-08-14 ENCOUNTER — Other Ambulatory Visit (HOSPITAL_COMMUNITY): Payer: Medicare Other

## 2018-08-14 ENCOUNTER — Encounter (HOSPITAL_COMMUNITY): Payer: Medicare Other

## 2018-08-14 ENCOUNTER — Ambulatory Visit: Payer: Medicare Other | Admitting: Vascular Surgery

## 2018-08-19 ENCOUNTER — Inpatient Hospital Stay (HOSPITAL_COMMUNITY)
Admission: AD | Admit: 2018-08-19 | Discharge: 2018-08-21 | DRG: 073 | Disposition: A | Discharge status: Home / Self Care | Payer: Medicare Other | Source: Other Acute Inpatient Hospital | Attending: Internal Medicine | Admitting: Internal Medicine

## 2018-08-19 DIAGNOSIS — E876 Hypokalemia: Secondary | ICD-10-CM | POA: Diagnosis present

## 2018-08-19 DIAGNOSIS — E1043 Type 1 diabetes mellitus with diabetic autonomic (poly)neuropathy: Principal | ICD-10-CM | POA: Diagnosis present

## 2018-08-19 DIAGNOSIS — R000 Tachycardia, unspecified: Secondary | ICD-10-CM | POA: Diagnosis present

## 2018-08-19 DIAGNOSIS — N186 End stage renal disease: Secondary | ICD-10-CM

## 2018-08-19 DIAGNOSIS — M545 Low back pain: Secondary | ICD-10-CM | POA: Diagnosis present

## 2018-08-19 DIAGNOSIS — K3184 Gastroparesis: Secondary | ICD-10-CM | POA: Diagnosis present

## 2018-08-19 DIAGNOSIS — R112 Nausea with vomiting, unspecified: Secondary | ICD-10-CM | POA: Diagnosis present

## 2018-08-19 DIAGNOSIS — E1022 Type 1 diabetes mellitus with diabetic chronic kidney disease: Secondary | ICD-10-CM | POA: Diagnosis present

## 2018-08-19 DIAGNOSIS — F17200 Nicotine dependence, unspecified, uncomplicated: Secondary | ICD-10-CM | POA: Diagnosis present

## 2018-08-19 DIAGNOSIS — E1065 Type 1 diabetes mellitus with hyperglycemia: Secondary | ICD-10-CM | POA: Diagnosis present

## 2018-08-19 DIAGNOSIS — G8929 Other chronic pain: Secondary | ICD-10-CM | POA: Diagnosis present

## 2018-08-19 DIAGNOSIS — I10 Essential (primary) hypertension: Secondary | ICD-10-CM

## 2018-08-19 DIAGNOSIS — Z1159 Encounter for screening for other viral diseases: Secondary | ICD-10-CM

## 2018-08-19 DIAGNOSIS — N2581 Secondary hyperparathyroidism of renal origin: Secondary | ICD-10-CM | POA: Diagnosis present

## 2018-08-19 DIAGNOSIS — E1165 Type 2 diabetes mellitus with hyperglycemia: Secondary | ICD-10-CM | POA: Diagnosis present

## 2018-08-19 DIAGNOSIS — Z833 Family history of diabetes mellitus: Secondary | ICD-10-CM

## 2018-08-19 DIAGNOSIS — I120 Hypertensive chronic kidney disease with stage 5 chronic kidney disease or end stage renal disease: Secondary | ICD-10-CM | POA: Diagnosis present

## 2018-08-19 DIAGNOSIS — E1143 Type 2 diabetes mellitus with diabetic autonomic (poly)neuropathy: Secondary | ICD-10-CM | POA: Diagnosis present

## 2018-08-19 DIAGNOSIS — IMO0002 Reserved for concepts with insufficient information to code with codable children: Secondary | ICD-10-CM | POA: Diagnosis present

## 2018-08-19 DIAGNOSIS — Z794 Long term (current) use of insulin: Secondary | ICD-10-CM

## 2018-08-19 DIAGNOSIS — Z992 Dependence on renal dialysis: Secondary | ICD-10-CM

## 2018-08-19 LAB — CBC
HCT: 39.7 % (ref 39.0–52.0)
Hemoglobin: 13.6 g/dL (ref 13.0–17.0)
MCH: 30 pg (ref 26.0–34.0)
MCHC: 34.3 g/dL (ref 30.0–36.0)
MCV: 87.6 fL (ref 80.0–100.0)
Platelets: 313 10*3/uL (ref 150–400)
RBC: 4.53 MIL/uL (ref 4.22–5.81)
RDW: 14.2 % (ref 11.5–15.5)
WBC: 18.3 10*3/uL — ABNORMAL HIGH (ref 4.0–10.5)
nRBC: 0 % (ref 0.0–0.2)

## 2018-08-19 LAB — BASIC METABOLIC PANEL
Anion gap: 16 — ABNORMAL HIGH (ref 5–15)
BUN: 84 mg/dL — ABNORMAL HIGH (ref 6–20)
CO2: 31 mmol/L (ref 22–32)
Calcium: 9.3 mg/dL (ref 8.9–10.3)
Chloride: 79 mmol/L — ABNORMAL LOW (ref 98–111)
Creatinine, Ser: 8.46 mg/dL — ABNORMAL HIGH (ref 0.61–1.24)
GFR calc Af Amer: 9 mL/min — ABNORMAL LOW (ref >60)
GFR calc non Af Amer: 7 mL/min — ABNORMAL LOW (ref >60)
Glucose, Bld: 240 mg/dL — ABNORMAL HIGH (ref 70–99)
Potassium: 3 mmol/L — ABNORMAL LOW (ref 3.5–5.1)
Sodium: 126 mmol/L — ABNORMAL LOW (ref 135–145)

## 2018-08-19 LAB — GLUCOSE, CAPILLARY: Glucose-Capillary: 204 mg/dL — ABNORMAL HIGH (ref 70–99)

## 2018-08-19 LAB — MAGNESIUM: Magnesium: 3.4 mg/dL — ABNORMAL HIGH (ref 1.7–2.4)

## 2018-08-19 MED ORDER — METOCLOPRAMIDE HCL 5 MG/ML IJ SOLN
5.0000 mg | Freq: Four times a day (QID) | INTRAMUSCULAR | Status: DC
  Administered 2018-08-19: 5 mg via INTRAVENOUS
  Filled 2018-08-19 (×2): qty 2

## 2018-08-19 MED ORDER — ONDANSETRON HCL 4 MG/2ML IJ SOLN
4.0000 mg | Freq: Four times a day (QID) | INTRAMUSCULAR | Status: DC | PRN
  Administered 2018-08-20 – 2018-08-21 (×2): 4 mg via INTRAVENOUS
  Filled 2018-08-19 (×3): qty 2

## 2018-08-19 MED ORDER — CLONIDINE HCL 0.3 MG/24HR TD PTWK: 0.3000 mg | MEDICATED_PATCH | TRANSDERMAL | Status: DC

## 2018-08-19 MED ORDER — ACETAMINOPHEN 650 MG RE SUPP: 650.0000 mg | Freq: Four times a day (QID) | RECTAL | Status: DC | PRN

## 2018-08-19 MED ORDER — ACETAMINOPHEN 325 MG PO TABS
650.0000 mg | ORAL_TABLET | Freq: Four times a day (QID) | ORAL | Status: DC | PRN
  Filled 2018-08-19: qty 2

## 2018-08-19 MED ORDER — LABETALOL HCL 200 MG PO TABS
200.0000 mg | ORAL_TABLET | Freq: Two times a day (BID) | ORAL | Status: DC
  Filled 2018-08-19: qty 1

## 2018-08-19 MED ORDER — SODIUM CHLORIDE 0.9% FLUSH
3.0000 mL | Freq: Two times a day (BID) | INTRAVENOUS | Status: DC
  Administered 2018-08-19 – 2018-08-21 (×4): 3 mL via INTRAVENOUS

## 2018-08-19 MED ORDER — OXYCODONE HCL 5 MG PO TABS
5.0000 mg | ORAL_TABLET | Freq: Four times a day (QID) | ORAL | Status: DC | PRN
  Administered 2018-08-20: 5 mg via ORAL
  Filled 2018-08-19: qty 1

## 2018-08-19 MED ORDER — INSULIN ASPART 100 UNIT/ML ~~LOC~~ SOLN
0.0000 [IU] | SUBCUTANEOUS | Status: DC
  Administered 2018-08-20: 5 [IU] via SUBCUTANEOUS
  Administered 2018-08-20: 3 [IU] via SUBCUTANEOUS
  Administered 2018-08-20: 5 [IU] via SUBCUTANEOUS
  Administered 2018-08-20: 1 [IU] via SUBCUTANEOUS

## 2018-08-19 MED ORDER — PANTOPRAZOLE SODIUM 40 MG PO TBEC
40.0000 mg | DELAYED_RELEASE_TABLET | Freq: Every day | ORAL | Status: DC
  Administered 2018-08-20 – 2018-08-21 (×2): 40 mg via ORAL
  Filled 2018-08-19 (×2): qty 1

## 2018-08-19 MED ORDER — INSULIN GLARGINE 100 UNIT/ML ~~LOC~~ SOLN
10.0000 [IU] | Freq: Every day | SUBCUTANEOUS | Status: DC
  Administered 2018-08-20: 10 [IU] via SUBCUTANEOUS
  Filled 2018-08-19 (×2): qty 0.1

## 2018-08-19 MED ORDER — HYDRALAZINE HCL 20 MG/ML IJ SOLN
10.0000 mg | INTRAMUSCULAR | Status: DC | PRN
  Filled 2018-08-19: qty 1

## 2018-08-19 MED ORDER — HEPARIN SODIUM (PORCINE) 5000 UNIT/ML IJ SOLN
5000.0000 [IU] | Freq: Three times a day (TID) | INTRAMUSCULAR | Status: DC
  Administered 2018-08-19 – 2018-08-20 (×3): 5000 [IU] via SUBCUTANEOUS
  Filled 2018-08-19 (×4): qty 1

## 2018-08-19 MED ORDER — HYDROMORPHONE HCL 1 MG/ML IJ SOLN
0.5000 mg | INTRAMUSCULAR | Status: AC | PRN
  Administered 2018-08-19 – 2018-08-20 (×2): 0.5 mg via INTRAVENOUS
  Filled 2018-08-19 (×2): qty 1

## 2018-08-19 MED ORDER — CARVEDILOL 25 MG PO TABS: 25.0000 mg | ORAL_TABLET | Freq: Two times a day (BID) | ORAL | Status: DC

## 2018-08-19 MED ORDER — AMLODIPINE BESYLATE 10 MG PO TABS
10.0000 mg | ORAL_TABLET | Freq: Every day | ORAL | Status: DC
  Administered 2018-08-20 – 2018-08-21 (×2): 10 mg via ORAL
  Filled 2018-08-19 (×2): qty 1

## 2018-08-19 NOTE — H&P (Signed)
History and Physical    Brent Daniels ZOX:[id number] DOB: 09/17/1985 DOA: 08/19/2018  PCP: Glenda Chroman, MD  Patient coming from: [place]  I have personally briefly reviewed patient's old medical records in [place]  Chief Complaint: Nausea and vomiting, abdominal pain  HPI: Brent Daniels is a 33 y.o. male with medical history significant for ESRD on TTS HD, insulin-dependent type 2 diabetes, hypertension, chronic pain, substance use disorder who is transferred to [facility] from [facility] for dialysis needs.  Patient was admitted there on 08/17/2018 for DKA treated with insulin and IV fluids which resolved.  He was noted to have continued persistent nausea and vomiting secondary to gastroparesis therefore unable to be discharged home and transferred to [facility] for dialysis needs.  Patient currently reports continued nausea, vomiting, and abdominal pain.  He reports brown emesis without obvious blood.  He reports good bowel movements and that he continues to make urine without dysuria.  He denies any subjective fevers, chills, diaphoresis, chest pain, dyspnea.  [facility] Course:  Per discharge summary, significant labs showed WBC 25, hemoglobin 11.5, BUN 84, creatinine 7.96, sodium 118, potassium 4.7, chloride 76, bicarb 20.  Blood gas pH 7.46, PCO2 45, PO2 71.  Chest x-ray did not show edema or consolidation.  CT of the abdomen and pelvis revealed stomach and lower esophagus fluid level most likely related to gastroparesis.  Patient was treated with IV fluids, Reglan, morphine as needed, and IV hydralazine and labetalol for hypertension.  Review of Systems: All systems reviewed and are negative except as documented in history of present illness above.   Past Medical History:  Diagnosis Date  . Acute esophagitis   . Anemia   . Anxiety   . Arthritis   . Bicuspid aortic valve    noted on 10/20/16 [facility])  . Chronic abdominal pain   .  Chronic back pain   . Chronic kidney disease    Stage 5 Kidney disease  . Depression   . Diabetes mellitus    type 1  . Diabetes mellitus without complication ([place])   . Diabetic gastroparesis associated with type 1 diabetes mellitus ([place])   . Erosive esophagitis 12/23/2013   "severe" per EGD   . Gastroparesis   . GERD (gastroesophageal reflux disease)   . Headache    in the past  . Heart murmur    mild TR, mild MR, mild AS 02/19/17  . History of hiatal hernia   . Hypercholesteremia   . Hypertension   . Insomnia   . Nausea and vomiting    chronic, recurrent  . Neuropathy   . Pneumonia   . PUD (peptic ulcer disease)   . Renal disorder   . S/P arthroscopic knee surgery 07/04/2011  . Scoliosis 07/11/2012    Past Surgical History:  Procedure Laterality Date  . AV FISTULA PLACEMENT Right 04/09/2017   Procedure: ARTERIOVENOUS (AV) FISTULA CREATION RIGHT ARM;  Surgeon: Rosetta Posner, MD;  Location: [facility];  Service: Vascular;  Laterality: Right;  . BIOPSY  06/17/2013   Procedure: GASTRIC ULCER AND ANTRAL BIOPSIES; ESOPHAGEAL BRUSHING;  Surgeon: Danie Binder, MD;  Location: [facility];  Service: Endoscopy;;  . BIOPSY N/A 12/23/2013   Procedure: ESOPHAGEAL BIOPSY;  Surgeon: Daneil Dolin, MD;  Location: [facility];  Service: Endoscopy;  Laterality: N/A;  . CHOLECYSTECTOMY N/A 01/28/2014   Procedure: LAPAROSCOPIC CHOLECYSTECTOMY;  Surgeon: Jamesetta So, MD;  Location: [facility];  Service: General;  Laterality: N/A;  . ESOPHAGOGASTRODUODENOSCOPY (EGD) WITH PROPOFOL  06/17/2013   Dr. Oneida Alar: two gastric ulcers in fundus, mild antral gastritis, candida esophagitis  . ESOPHAGOGASTRODUODENOSCOPY (EGD) WITH PROPOFOL N/A 09/11/2013   Dr. [name]: abnormal hypopharynx, question massively enlarged tonsils, stomach full of food precluded exam, needs EGD for verification of ulcer healing at a later date  . ESOPHAGOGASTRODUODENOSCOPY (EGD) WITH PROPOFOL N/A 12/23/2013   RMR: Severe exudative esophagitis likely  predominatly reflux related. Superimposed Candida infection not excluded status post KOH brushing and biopsy. Localized excoriating gastric mucosa most consistant with trauma(vomiting). No evidence of peptic ulcer disease or other gastric/duodenal pathology. I suspect severe inflammation involving the distal esophagus may account  for at least some of patii  . INCISION AND DRAINAGE ABSCESS N/A 10/19/2017   Procedure: INCISION AND DRAINAGE ABSCESS SCROTAL EXPLORATION,WASHOUT, Petros OF TISSUE;  Surgeon: Ardis Hughs, MD;  Location: [facility];  Service: Urology;  Laterality: N/A;  . IR FLUORO GUIDE CV LINE RIGHT  05/03/2018  . IR US GUIDE VASC ACCESS RIGHT  05/03/2018  . IRRIGATION AND DEBRIDEMENT ABSCESS N/A 10/21/2017   Procedure: IRRIGATION AND DEBRIDEMENT ABSCESS;  Surgeon: Ardis Hughs, MD;  Location: [facility];  Service: Urology;  Laterality: N/A;  . KNEE ARTHROSCOPY  06/30/2011   Procedure: ARTHROSCOPY KNEE;  Surgeon: Carole Civil, MD;  Location: [facility];  Service: Orthopedics;  Laterality: Right;  diagnostic arthroscopy  . TYMPANOSTOMY TUBE PLACEMENT      Social History:  reports that he has been smoking. He has never used smokeless tobacco. He reports that he does not drink alcohol or use drugs.  Allergies  Allergen Reactions  . Nsaids Nausea And Vomiting  . Tramadol Anaphylaxis, Other (See Comments) and Nausea Only    Acid Reflux Acid Reflux  . Ibuprofen Other (See Comments) and Nausea And Vomiting    Stomach ulcers  . Naproxen Other (See Comments)    Stomach ulcers Stomach ulcers  . Sulfa Antibiotics Rash and Other (See Comments)    Stomach ulcers   . Hydrocodone Bitartrate Er     Anaphylaxis Can take plain Tylenol  . Ketorolac Other (See Comments)    Unknown    Family History  Problem Relation Age of Onset  . Cancer Father   . Alcohol abuse Father   . Diabetes Father   . Diabetes Paternal Grandfather   . Pseudochol deficiency Neg Hx   . Malignant  hyperthermia Neg Hx   . Hypotension Neg Hx   . Anesthesia problems Neg Hx   . Colon cancer Neg Hx      Prior to Admission medications   Medication Sig Start Date End Date Taking? Authorizing Provider  acetaminophen (TYLENOL) 500 MG tablet Take 1 tablet (500 mg total) by mouth every 6 (six) hours as needed for mild pain, moderate pain, fever or headache. 07/03/18   Debbe Odea, MD  alum & mag hydroxide-simeth (MAALOX/MYLANTA) 200-200-20 MG/5ML suspension Take 30 mLs by mouth every 6 (six) hours as needed for indigestion or heartburn. 07/03/18   Debbe Odea, MD  amLODipine (NORVASC) 10 MG tablet Take 10 mg by mouth daily.  05/08/15   [provider]  amLODipine (NORVASC) 10 MG tablet Take 10 mg by mouth daily. 05/15/18   [provider]  amoxicillin-clavulanate (AUGMENTIN) 500-125 MG tablet Take 1 tablet (500 mg total) by mouth 2 (two) times daily. 10/29/17   Thurnell Lose, MD  BAYER MICROLET LANCETS lancets Use as instructed to test sugar 5 times daily 10/16/17  Elayne Snare, MD  bisacodyl (DULCOLAX) 10 MG suppository Place 1 suppository (10 mg total) rectally daily as needed for moderate constipation. 07/03/18   Debbe Odea, MD  Buprenorphine HCl-Naloxone HCl 8-2 MG FILM Place 1 Film under the tongue 2 (two) times daily.    [provider]  Buprenorphine HCl-Naloxone HCl 8-2 MG FILM Place 1 Film under the tongue 3 (three) times daily. 06/14/18   [provider]  calcitRIOL (ROCALTROL) 0.25 MCG capsule Take 1 capsule (0.25 mcg total) by mouth daily. Patient not taking: Reported on 10/16/2017 06/02/17   Orson Eva, MD  calcium acetate (PHOSLO) 667 MG capsule Take 667 mg by mouth 3 (three) times daily with meals. 05/08/17   [provider]  carvedilol (COREG) 25 MG tablet Take 1 tablet (25 mg total) by mouth 2 (two) times daily with a meal. 06/02/17   Tat, Shanon Brow, MD  cloNIDine (CATAPRES - DOSED IN MG/24 HR) 0.3 mg/24hr patch Place 1 patch (0.3 mg total) onto  the skin once a week. Change on Friday's 06/08/17   Orson Eva, MD  collagenase (SANTYL) ointment Apply topically daily. 10/30/17   Thurnell Lose, MD  Continuous Blood Gluc Receiver (FREESTYLE LIBRE 14 DAY READER) DEVI 1 each by Does not apply route every 14 (fourteen) days. 04/05/17   Cassandria Anger, MD  DULoxetine (CYMBALTA) 30 MG capsule Take 30 mg by mouth 2 (two) times daily. 05/15/18   [provider]  folic acid (FOLVITE) 1 MG tablet Take 1 tablet (1 mg total) by mouth daily. 07/04/18   Debbe Odea, MD  furosemide (LASIX) 40 MG tablet Take 40 mg by mouth daily.  09/13/17   [provider]  gabapentin (NEURONTIN) 300 MG capsule Take 1 capsule (300 mg total) by mouth at bedtime. Patient not taking: Reported on 10/16/2017 11/19/16   Murlean Iba, MD  Gauze Pads & Dressings (DRESSING SPONGES) 4"X4" PADS Provide 1 month supply for daily dressing - Kerlix 4.5 inch-wide roll gauze(s)  Cover with dry dressing (gauze or ABD pads) & tape, daily change 10/29/17   Thurnell Lose, MD  glucose blood (CONTOUR NEXT TEST) test strip Use as instructed to test sugar 5 times daily 09/19/17   Elayne Snare, MD  glucose blood (ONETOUCH VERIO) test strip Use to test blood sugar two times daily 12/12/17   Elayne Snare, MD  HUMALOG KWIKPEN 100 UNIT/ML KwikPen Inject 45 Units into the skin daily. Inject into the skin as directed per sliding (max dose 45 units daily) 06/19/18   [provider]  Insulin Glargine (BASAGLAR KWIKPEN) 100 UNIT/ML SOPN Inject 6 Units into the skin 2 (two) times daily.    [provider]  insulin lispro (HUMALOG KWIKPEN) 100 UNIT/ML KwikPen INJECT 10 UNITS INTO THE SKIN THREE TIMES DAILY BEFORE MEALS (this is a 50 DAY supply) 01/21/18   Elayne Snare, MD  Insulin Pen Needle (CARETOUCH PEN NEEDLES) 31G X 8 MM MISC To use with Basaglar and Humalog pens, 4 times daily 10/23/17   Elayne Snare, MD  labetalol (NORMODYNE) 200 MG tablet Take 1 tablet (200 mg total)  by mouth 2 (two) times daily. 07/03/18   Debbe Odea, MD  LEVEMIR FLEXTOUCH 100 UNIT/ML Pen Inject 25 Units into the skin See admin instructions. 25 units in the morning and evening 06/19/18   [provider]  metoCLOPramide (REGLAN) 5 MG tablet Take 1 tablet (5 mg total) by mouth 3 (three) times daily before meals. 07/03/18   Debbe Odea, MD  NOVOFINE 32G X 6 MM MISC USE TO INJECT INSULIN FIVE TIMES DAILY 06/19/18   [provider]  oxyCODONE (OXY IR/ROXICODONE) 5 MG immediate release tablet Take 1 tablet (5 mg total) by mouth every 6 (six) hours as needed for moderate pain or severe pain. 10/29/17   Thurnell Lose, MD  pantoprazole (PROTONIX) 40 MG tablet Take 1 tablet (40 mg total) by mouth 2 (two) times daily before a meal. Patient taking differently: Take 40 mg by mouth daily.  11/19/16 04/04/18  Johnson, Clanford L, MD  pantoprazole (PROTONIX) 40 MG tablet Take 40 mg by mouth daily. 05/15/18   [provider]  promethazine (PHENERGAN) 25 MG tablet Take 25 mg by mouth 3 (three) times daily as needed for nausea or vomiting.  09/15/17   [provider]  promethazine (PHENERGAN) 25 MG tablet Take 1 tablet (25 mg total) by mouth every 8 (eight) hours as needed. 07/03/18   Debbe Odea, MD  tiZANidine (ZANAFLEX) 4 MG tablet Take 2 mg by mouth every 6 (six) hours as needed for muscle spasms.     [provider]    Physical Exam: Vitals:   08/19/18 2000 08/19/18 2213  BP: (!) 170/114   Pulse: 99   Resp: 20   Temp: 98.3 F (36.8 C)   TempSrc: Oral   SpO2: 99%   Weight:  78.9 kg  Height: 6\' 1"  (1.854 m)     Constitutional: Resting in the left lateral decubitus position in bed, NAD, comfortable Eyes: PERRL, lids and conjunctivae normal ENMT: Mucous membranes are moist. Posterior pharynx clear of any exudate or lesions.Normal dentition.  Neck: normal, supple, no masses. Respiratory: clear to auscultation bilaterally, no wheezing, no crackles. Normal  respiratory effort. No accessory muscle use.  Cardiovascular: Regular rate and rhythm, no murmurs / rubs / gallops. No extremity edema. 2+ pedal pulses.  HD cath in place right chest wall. Abdomen: Mild generalized tenderness, no masses palpated. No hepatosplenomegaly. Bowel sounds positive.  Musculoskeletal: no clubbing / cyanosis. No joint deformity upper and lower extremities. Good ROM, no contractures. Normal muscle tone.  Skin: no rashes, lesions, ulcers. No induration Neurologic: CN 2-12 grossly intact. Sensation intact, Strength 5/5 in all 4.  Psychiatric: Normal judgment and insight. Alert and oriented x 3. Normal mood.     Labs on Admission: I have personally reviewed following labs and imaging studies  CBC: No results for input(s): WBC, NEUTROABS, HGB, HCT, MCV, PLT in the last 168 hours. Basic Metabolic Panel: No results for input(s): NA, K, CL, CO2, GLUCOSE, BUN, CREATININE, CALCIUM, MG, PHOS in the last 168 hours. GFR: CrCl cannot be calculated (Patient's most recent lab result is older than the maximum 21 days allowed.). Liver Function Tests: No results for input(s): AST, ALT, ALKPHOS, BILITOT, PROT, ALBUMIN in the last 168 hours. No results for input(s): LIPASE, AMYLASE in the last 168 hours. No results for input(s): AMMONIA in the last 168 hours. Coagulation Profile: No results for input(s): INR, PROTIME in the last 168 hours. Cardiac Enzymes: No results for input(s): CKTOTAL, CKMB, CKMBINDEX, TROPONINI in the last 168 hours. BNP (last 3 results) No results for input(s): PROBNP in the last 8760 hours. HbA1C: No results for input(s): HGBA1C in the last 72 hours. CBG: Recent Labs  Lab 08/19/18 2128  GLUCAP 204*   Lipid Profile: No results for input(s): CHOL, HDL, LDLCALC, TRIG, CHOLHDL, LDLDIRECT in the last 72 hours. Thyroid Function Tests: No results for input(s): TSH, T4TOTAL, FREET4, T3FREE, THYROIDAB in  the last 72 hours. Anemia Panel: No results for  input(s): VITAMINB12, FOLATE, FERRITIN, TIBC, IRON, RETICCTPCT in the last 72 hours. Urine analysis:    Component Value Date/Time   COLORURINE STRAW (A) 06/27/2018 0509   APPEARANCEUR CLEAR 06/27/2018 0509   LABSPEC 1.010 06/27/2018 0509   PHURINE 7.0 06/27/2018 0509   GLUCOSEU >=500 (A) 06/27/2018 0509   HGBUR SMALL (A) 06/27/2018 0509   BILIRUBINUR NEGATIVE 06/27/2018 0509   KETONESUR 5 (A) 06/27/2018 0509   PROTEINUR >=300 (A) 06/27/2018 0509   UROBILINOGEN 1.0 11/13/2014 0155   NITRITE NEGATIVE 06/27/2018 0509   LEUKOCYTESUR NEGATIVE 06/27/2018 0509    Radiological Exams on Admission: No results found.  EKG: Not performed.  Assessment/Plan Principal Problem:   Nausea and vomiting Active Problems:   Diabetic gastroparesis (HCC)   ESRD (end stage renal disease) on dialysis ([place])   Diabetes type 2, uncontrolled ([place])   Uncontrolled hypertension  Brent Daniels is a 33 y.o. male with medical history significant for ESRD on TTS HD, insulin-dependent type 2 diabetes, hypertension, chronic pain, substance use disorder who is transferred to [facility] from [facility] for intractable nausea and vomiting and dialysis needs.   Intractable nausea and vomiting/gastroparesis: CT abdomen/pelvis at outside hospital reportedly showed stomach and lower esophagus fluid level most likely related to gastroparesis.  Patient currently does not feel ready to try oral intake. -IV Reglan 5 mg every 6 hours, as needed IV Zofran -Keep n.p.o. for now, advance diet as tolerated  ESRD on TTS HD: Reports last dialysis on 08/13/2018. -Obtain labs -Will need nephrology consultation in a.m. for dialysis needs  Uncontrolled type 2 diabetes: A1c 8.8% on 06/27/2018.  Was admitted and treated for DKA on 08/17/2018 at [facility] prior to transfer here. -Lantus 10 units daily, SSI q4h adjust as needed  Uncontrolled hypertension: Hypertensive on admission. -Continue home amlodipine,  labetalol orally as tolerated -Resume clonidine 0.3 mg patch -IV hydralazine as needed  Chronic pain/substance use disorder: Previously on Suboxone, unclear if he is still taking this.   DVT prophylaxis: Subcutaneous heparin Code Status: Full code, confirmed with patient Family Communication: None present on admission Disposition Plan: Pending clinical progress Consults called: None, need nephrology consultation in a.m. Admission status: Observation   Zada Finders MD [facility]  If 7PM-7AM, please contact night-coverage [email or web address]  08/19/2018, 11:03 PM

## 2018-08-20 ENCOUNTER — Other Ambulatory Visit: Payer: Self-pay

## 2018-08-20 ENCOUNTER — Encounter (HOSPITAL_COMMUNITY): Payer: Self-pay

## 2018-08-20 DIAGNOSIS — E1143 Type 2 diabetes mellitus with diabetic autonomic (poly)neuropathy: Secondary | ICD-10-CM | POA: Diagnosis not present

## 2018-08-20 DIAGNOSIS — N186 End stage renal disease: Secondary | ICD-10-CM | POA: Diagnosis present

## 2018-08-20 DIAGNOSIS — M545 Low back pain: Secondary | ICD-10-CM | POA: Diagnosis present

## 2018-08-20 DIAGNOSIS — N2581 Secondary hyperparathyroidism of renal origin: Secondary | ICD-10-CM | POA: Diagnosis present

## 2018-08-20 DIAGNOSIS — R000 Tachycardia, unspecified: Secondary | ICD-10-CM | POA: Diagnosis present

## 2018-08-20 DIAGNOSIS — K3184 Gastroparesis: Secondary | ICD-10-CM | POA: Diagnosis present

## 2018-08-20 DIAGNOSIS — Z794 Long term (current) use of insulin: Secondary | ICD-10-CM | POA: Diagnosis not present

## 2018-08-20 DIAGNOSIS — E1043 Type 1 diabetes mellitus with diabetic autonomic (poly)neuropathy: Secondary | ICD-10-CM | POA: Diagnosis present

## 2018-08-20 DIAGNOSIS — E1165 Type 2 diabetes mellitus with hyperglycemia: Secondary | ICD-10-CM | POA: Diagnosis not present

## 2018-08-20 DIAGNOSIS — G8929 Other chronic pain: Secondary | ICD-10-CM | POA: Diagnosis present

## 2018-08-20 DIAGNOSIS — E1022 Type 1 diabetes mellitus with diabetic chronic kidney disease: Secondary | ICD-10-CM | POA: Diagnosis present

## 2018-08-20 DIAGNOSIS — F17200 Nicotine dependence, unspecified, uncomplicated: Secondary | ICD-10-CM | POA: Diagnosis present

## 2018-08-20 DIAGNOSIS — I10 Essential (primary) hypertension: Secondary | ICD-10-CM

## 2018-08-20 DIAGNOSIS — Z992 Dependence on renal dialysis: Secondary | ICD-10-CM | POA: Diagnosis not present

## 2018-08-20 DIAGNOSIS — E1065 Type 1 diabetes mellitus with hyperglycemia: Secondary | ICD-10-CM | POA: Diagnosis present

## 2018-08-20 DIAGNOSIS — Z833 Family history of diabetes mellitus: Secondary | ICD-10-CM | POA: Diagnosis not present

## 2018-08-20 DIAGNOSIS — E876 Hypokalemia: Secondary | ICD-10-CM | POA: Diagnosis present

## 2018-08-20 DIAGNOSIS — R112 Nausea with vomiting, unspecified: Secondary | ICD-10-CM | POA: Diagnosis not present

## 2018-08-20 DIAGNOSIS — Z1159 Encounter for screening for other viral diseases: Secondary | ICD-10-CM | POA: Diagnosis not present

## 2018-08-20 DIAGNOSIS — I120 Hypertensive chronic kidney disease with stage 5 chronic kidney disease or end stage renal disease: Secondary | ICD-10-CM | POA: Diagnosis present

## 2018-08-20 LAB — CBC
HCT: 39.2 % (ref 39.0–52.0)
Hemoglobin: 13.3 g/dL (ref 13.0–17.0)
MCH: 30.3 pg (ref 26.0–34.0)
MCHC: 33.9 g/dL (ref 30.0–36.0)
MCV: 89.3 fL (ref 80.0–100.0)
Platelets: 314 10*3/uL (ref 150–400)
RBC: 4.39 MIL/uL (ref 4.22–5.81)
RDW: 14.3 % (ref 11.5–15.5)
WBC: 14.8 10*3/uL — ABNORMAL HIGH (ref 4.0–10.5)
nRBC: 0 % (ref 0.0–0.2)

## 2018-08-20 LAB — RENAL FUNCTION PANEL
Albumin: 3.5 g/dL (ref 3.5–5.0)
Anion gap: 18 — ABNORMAL HIGH (ref 5–15)
BUN: 86 mg/dL — ABNORMAL HIGH (ref 6–20)
CO2: 31 mmol/L (ref 22–32)
Calcium: 9.4 mg/dL (ref 8.9–10.3)
Chloride: 81 mmol/L — ABNORMAL LOW (ref 98–111)
Creatinine, Ser: 8.54 mg/dL — ABNORMAL HIGH (ref 0.61–1.24)
GFR calc Af Amer: 8 mL/min — ABNORMAL LOW (ref >60)
GFR calc non Af Amer: 7 mL/min — ABNORMAL LOW (ref >60)
Glucose, Bld: 186 mg/dL — ABNORMAL HIGH (ref 70–99)
Phosphorus: 5.6 mg/dL — ABNORMAL HIGH (ref 2.5–4.6)
Potassium: 2.6 mmol/L — CL (ref 3.5–5.1)
Sodium: 130 mmol/L — ABNORMAL LOW (ref 135–145)

## 2018-08-20 LAB — BASIC METABOLIC PANEL
Anion gap: 14 (ref 5–15)
BUN: 52 mg/dL — ABNORMAL HIGH (ref 6–20)
CO2: 24 mmol/L (ref 22–32)
Calcium: 8.7 mg/dL — ABNORMAL LOW (ref 8.9–10.3)
Chloride: 91 mmol/L — ABNORMAL LOW (ref 98–111)
Creatinine, Ser: 6.17 mg/dL — ABNORMAL HIGH (ref 0.61–1.24)
GFR calc Af Amer: 12 mL/min — ABNORMAL LOW (ref >60)
GFR calc non Af Amer: 11 mL/min — ABNORMAL LOW (ref >60)
Glucose, Bld: 385 mg/dL — ABNORMAL HIGH (ref 70–99)
Potassium: 3.5 mmol/L (ref 3.5–5.1)
Sodium: 129 mmol/L — ABNORMAL LOW (ref 135–145)

## 2018-08-20 LAB — GLUCOSE, CAPILLARY
Glucose-Capillary: 123 mg/dL — ABNORMAL HIGH (ref 70–99)
Glucose-Capillary: 204 mg/dL — ABNORMAL HIGH (ref 70–99)
Glucose-Capillary: 228 mg/dL — ABNORMAL HIGH (ref 70–99)
Glucose-Capillary: 262 mg/dL — ABNORMAL HIGH (ref 70–99)
Glucose-Capillary: 277 mg/dL — ABNORMAL HIGH (ref 70–99)
Glucose-Capillary: 376 mg/dL — ABNORMAL HIGH (ref 70–99)

## 2018-08-20 LAB — SARS CORONAVIRUS 2 BY RT PCR (HOSPITAL ORDER, PERFORMED IN ~~LOC~~ HOSPITAL LAB): SARS Coronavirus 2: NEGATIVE

## 2018-08-20 MED ORDER — HEPARIN SODIUM (PORCINE) 1000 UNIT/ML DIALYSIS: 800.0000 [IU] | INTRAMUSCULAR | Status: DC | PRN

## 2018-08-20 MED ORDER — LEVEMIR FLEXTOUCH 100 UNIT/ML ~~LOC~~ SOPN: 5.0000 [IU] | PEN_INJECTOR | Freq: Two times a day (BID) | SUBCUTANEOUS | 11 refills | Status: DC

## 2018-08-20 MED ORDER — DIPHENHYDRAMINE HCL 50 MG/ML IJ SOLN
25.0000 mg | Freq: Once | INTRAMUSCULAR | Status: AC
  Administered 2018-08-20: 25 mg via INTRAVENOUS

## 2018-08-20 MED ORDER — INSULIN ASPART 100 UNIT/ML ~~LOC~~ SOLN: 0.0000 [IU] | Freq: Three times a day (TID) | SUBCUTANEOUS | Status: DC

## 2018-08-20 MED ORDER — ALUM & MAG HYDROXIDE-SIMETH 200-200-20 MG/5ML PO SUSP
15.0000 mL | Freq: Four times a day (QID) | ORAL | Status: DC | PRN
  Administered 2018-08-20 (×2): 15 mL via ORAL
  Filled 2018-08-20 (×2): qty 30

## 2018-08-20 MED ORDER — LIDOCAINE HCL (PF) 1 % IJ SOLN: 5.0000 mL | INTRAMUSCULAR | Status: DC | PRN

## 2018-08-20 MED ORDER — PENTAFLUOROPROP-TETRAFLUOROETH EX AERO: 1.0000 "application " | INHALATION_SPRAY | CUTANEOUS | Status: DC | PRN

## 2018-08-20 MED ORDER — INSULIN ASPART 100 UNIT/ML ~~LOC~~ SOLN
10.0000 [IU] | Freq: Three times a day (TID) | SUBCUTANEOUS | Status: DC
  Administered 2018-08-21: 10 [IU] via SUBCUTANEOUS

## 2018-08-20 MED ORDER — METOCLOPRAMIDE HCL 5 MG/ML IJ SOLN
10.0000 mg | Freq: Four times a day (QID) | INTRAMUSCULAR | Status: DC
  Administered 2018-08-20 – 2018-08-21 (×4): 10 mg via INTRAVENOUS
  Filled 2018-08-20 (×4): qty 2

## 2018-08-20 MED ORDER — ALTEPLASE 2 MG IJ SOLR: 2.0000 mg | Freq: Once | INTRAMUSCULAR | Status: DC | PRN

## 2018-08-20 MED ORDER — SODIUM CHLORIDE 0.9 % IV SOLN: 100.0000 mL | INTRAVENOUS | Status: DC | PRN

## 2018-08-20 MED ORDER — LIDOCAINE-PRILOCAINE 2.5-2.5 % EX CREA: 1.0000 "application " | TOPICAL_CREAM | CUTANEOUS | Status: DC | PRN

## 2018-08-20 MED ORDER — HEPARIN SODIUM (PORCINE) 1000 UNIT/ML IJ SOLN
INTRAMUSCULAR | Status: AC
  Administered 2018-08-20: 18:00:00
  Filled 2018-08-20: qty 4

## 2018-08-20 MED ORDER — LABETALOL HCL 200 MG PO TABS
200.0000 mg | ORAL_TABLET | Freq: Three times a day (TID) | ORAL | Status: DC
  Administered 2018-08-20 – 2018-08-21 (×2): 200 mg via ORAL
  Filled 2018-08-20 (×2): qty 1

## 2018-08-20 MED ORDER — HEPARIN SODIUM (PORCINE) 1000 UNIT/ML DIALYSIS: 1000.0000 [IU] | INTRAMUSCULAR | Status: DC | PRN

## 2018-08-20 MED ORDER — HUMALOG KWIKPEN 100 UNIT/ML ~~LOC~~ SOPN: 0.0000 [IU] | PEN_INJECTOR | Freq: Three times a day (TID) | SUBCUTANEOUS | 11 refills | Status: DC

## 2018-08-20 MED ORDER — METOCLOPRAMIDE HCL 10 MG PO TABS: 10.0000 mg | ORAL_TABLET | Freq: Three times a day (TID) | ORAL | 0 refills | Status: DC

## 2018-08-20 MED ORDER — INSULIN ASPART 100 UNIT/ML ~~LOC~~ SOLN
0.0000 [IU] | Freq: Three times a day (TID) | SUBCUTANEOUS | Status: DC
  Administered 2018-08-20: 15 [IU] via SUBCUTANEOUS
  Administered 2018-08-21: 8 [IU] via SUBCUTANEOUS

## 2018-08-20 MED ORDER — DIPHENHYDRAMINE HCL 50 MG/ML IJ SOLN
INTRAMUSCULAR | Status: AC
  Administered 2018-08-20: 25 mg via INTRAVENOUS
  Filled 2018-08-20: qty 1

## 2018-08-20 MED ORDER — INSULIN ASPART 100 UNIT/ML ~~LOC~~ SOLN
0.0000 [IU] | Freq: Every day | SUBCUTANEOUS | Status: DC
  Administered 2018-08-20: 2 [IU] via SUBCUTANEOUS

## 2018-08-20 MED ORDER — CHLORHEXIDINE GLUCONATE CLOTH 2 % EX PADS
6.0000 | MEDICATED_PAD | Freq: Every day | CUTANEOUS | Status: DC
  Administered 2018-08-20: 6 via TOPICAL

## 2018-08-20 MED ORDER — BUPRENORPHINE HCL-NALOXONE HCL 8-2 MG SL SUBL
1.0000 | SUBLINGUAL_TABLET | Freq: Every day | SUBLINGUAL | Status: DC
  Administered 2018-08-20 – 2018-08-21 (×2): 1 via SUBLINGUAL
  Filled 2018-08-20 (×2): qty 1

## 2018-08-20 MED ORDER — POTASSIUM CHLORIDE CRYS ER 20 MEQ PO TBCR
40.0000 meq | EXTENDED_RELEASE_TABLET | Freq: Once | ORAL | Status: AC
  Administered 2018-08-20: 40 meq via ORAL
  Filled 2018-08-20: qty 2

## 2018-08-20 MED ORDER — LABETALOL HCL 300 MG PO TABS: 300.0000 mg | ORAL_TABLET | Freq: Three times a day (TID) | ORAL | Status: DC

## 2018-08-20 MED ORDER — LORAZEPAM 2 MG/ML IJ SOLN
0.5000 mg | Freq: Once | INTRAMUSCULAR | Status: AC
  Administered 2018-08-20: 0.5 mg via INTRAVENOUS
  Filled 2018-08-20: qty 1

## 2018-08-20 MED ORDER — INSULIN GLARGINE 100 UNIT/ML ~~LOC~~ SOLN
20.0000 [IU] | Freq: Two times a day (BID) | SUBCUTANEOUS | Status: DC
  Administered 2018-08-20 – 2018-08-21 (×2): 20 [IU] via SUBCUTANEOUS
  Filled 2018-08-20 (×4): qty 0.2

## 2018-08-20 NOTE — Progress Notes (Signed)
Inpatient Diabetes Program Recommendations  AACE/ADA: New Consensus Statement on Inpatient Glycemic Control (2015)  Target Ranges:  Prepandial:   less than 140 mg/dL      Peak postprandial:   less than 180 mg/dL (1-2 hours)      Critically ill patients:  140 - 180 mg/dL   Lab Results  Component Value Date   GLUCAP 262 (H) 08/20/2018   HGBA1C 8.8 (H) 06/27/2018    Review of Glycemic Control Results for JOSEDANIEL, HAYE (MRN [id number]) as of 08/20/2018 13:37  Ref. Range 08/20/2018 00:06 08/20/2018 04:02 08/20/2018 07:23 08/20/2018 11:12  Glucose-Capillary Latest Ref Range: 70 - 99 mg/dL 277 (H) 204 (H) 123 (H) 262 (H)   Diabetes history: Type 1 DM Outpatient Diabetes medications: Humalog 45 units QD per SSI, Levemir 25 units BID Current orders for Inpatient glycemic control: Novolog 0-9 units Q4H, Lantus 10 units QHS  Inpatient Diabetes Program Recommendations:    Noted diet order. If tolerating recommend switching correction to Novolog 0-9 units TID, adding Novolog 3 units TID (assuming patient is consuming >50% of meal), and increasing Lantus to 14 units QHS.  Thanks, Bronson Curb, MSN, RNC-OB Diabetes Coordinator [phone] (8a-5p)

## 2018-08-20 NOTE — Progress Notes (Signed)
PROGRESS NOTE  Brent Daniels PZW:[id number] DOB: Sep 11, 1985   PCP: Glenda Chroman, MD  Patient is from: Home via [place]: 08/19/2018 LOS: 1  Brief Narrative / Interim history: 33 y.o.malewith medical history significant forESRD on TTS HD, insulin-dependent type 2 diabetes, hypertension, chronic pain, substance use disorder who is transferred to [facility] from [facility] for dialysis needs.  Initially admitted for DKA to [facility].  Continues to have intractable nausea/vomiting/abdominal pain.  Transferred here for dialysis.  Has not had dialysis in a week.  Subjective: Patient arrived last night.  Complaints of abdominal pain and low back pain.  Describes the pain as aching.  He could not be more specific.  Rates his pain 10 out of 10.  Abdominal pain is diffuse.  No aggravating factor.  Alleviated by IV Dilaudid.  Back pain chronic.  Denies radiation to his legs.  Denies lower extremity weakness, numbness, tingling or fecal incontinence.  Patient is on Suboxone outpatient.  Reports nausea but no emesis.  Denies chest pain or dyspnea.  Objective: Vitals:   08/20/18 1600 08/20/18 1630 08/20/18 1700 08/20/18 1701  BP: (!) 143/87 (!) 163/94 (!) 152/86 (!) 150/92  Pulse: (!) 104 (!) 102 (!) 111 (!) 102  Resp: (!) 22   15  Temp:    98.9 F (37.2 C)  TempSrc:    Oral  SpO2:    97%  Weight:    78.1 kg  Height:        Intake/Output Summary (Last 24 hours) at 08/20/2018 1807 Last data filed at 08/20/2018 1701 Gross per 24 hour  Intake 480 ml  Output 2090 ml  Net -1610 ml   Filed Weights   08/19/18 2213 08/20/18 1420 08/20/18 1701  Weight: 78.9 kg 82.9 kg 78.1 kg    Examination:  GENERAL: No acute distress.  Appears well.  HEENT: MMM.  Vision and hearing grossly intact.  NECK: Supple.  No apparent JVD. LUNGS:  No IWOB. Good air movement bilaterally. HEART:  RRR. Heart sounds normal.  ABD: Bowel sounds present. Soft.  Mild diffuse tenderness but no rebound or  guarding. MSK/EXT:  Moves all extremities. No apparent deformity. No edema bilaterally. SKIN: no apparent skin lesion or wound NEURO: Awake, alert and oriented appropriately.  No gross deficit.  PSYCH: Calm.   I have personally reviewed the following labs and images:  Radiology Studies: No results found.  Microbiology: Recent Results (from the past 240 hour(s))  SARS Coronavirus 2 (CEPHEID - Performed in [place] hospital lab), Hosp Order     Status: None   Collection Time: 08/19/18 11:08 PM   Specimen: Nasopharyngeal Swab  Result Value Ref Range Status   SARS Coronavirus 2 NEGATIVE NEGATIVE Final    Comment: (NOTE) If result is NEGATIVE SARS-CoV-2 target nucleic acids are NOT DETECTED. The SARS-CoV-2 RNA is generally detectable in upper and lower  respiratory specimens during the acute phase of infection. The lowest  concentration of SARS-CoV-2 viral copies this assay can detect is 250  copies / mL. A negative result does not preclude SARS-CoV-2 infection  and should not be used as the sole basis for treatment or other  patient management decisions.  A negative result may occur with  improper specimen collection / handling, submission of specimen other  than nasopharyngeal swab, presence of viral mutation(s) within the  areas targeted by this assay, and inadequate number of viral copies  (<250 copies / mL). A negative result must be combined with clinical  observations, patient history, and epidemiological information. If result is POSITIVE SARS-CoV-2 target nucleic acids are DETECTED. The SARS-CoV-2 RNA is generally detectable in upper and lower  respiratory specimens dur ing the acute phase of infection.  Positive  results are indicative of active infection with SARS-CoV-2.  Clinical  correlation with patient history and other diagnostic information is  necessary to determine patient infection status.  Positive results do  not rule out bacterial infection or co-infection  with other viruses. If result is PRESUMPTIVE POSTIVE SARS-CoV-2 nucleic acids MAY BE PRESENT.   A presumptive positive result was obtained on the submitted specimen  and confirmed on repeat testing.  While 2019 novel coronavirus  (SARS-CoV-2) nucleic acids may be present in the submitted sample  additional confirmatory testing may be necessary for epidemiological  and / or clinical management purposes  to differentiate between  SARS-CoV-2 and other Sarbecovirus currently known to infect humans.  If clinically indicated additional testing with an alternate test  methodology [phone]) is advised. The SARS-CoV-2 RNA is generally  detectable in upper and lower respiratory sp ecimens during the acute  phase of infection. The expected result is Negative. Fact Sheet for Patients:  StrictlyIdeas.no Fact Sheet for Healthcare Providers: BankingDealers.co.za This test is not yet approved or cleared by the [place] FDA and has been authorized for detection and/or diagnosis of SARS-CoV-2 by FDA under an Emergency Use Authorization (EUA).  This EUA will remain in effect (meaning this test can be used) for the duration of the COVID-19 declaration under Section 564(b)(1) of the Act, 21 U.S.C. section 360bbb-3(b)(1), unless the authorization is terminated or revoked sooner. Performed at [facility], [place] [address], [place], [place] [address]     Sepsis Labs: Invalid input(s): PROCALCITONIN, LACTICIDVEN  Urine analysis:    Component Value Date/Time   COLORURINE STRAW (A) 06/27/2018 0509   APPEARANCEUR CLEAR 06/27/2018 0509   LABSPEC 1.010 06/27/2018 0509   PHURINE 7.0 06/27/2018 0509   GLUCOSEU >=500 (A) 06/27/2018 0509   HGBUR SMALL (A) 06/27/2018 0509   BILIRUBINUR NEGATIVE 06/27/2018 0509   KETONESUR 5 (A) 06/27/2018 0509   PROTEINUR >=300 (A) 06/27/2018 0509   UROBILINOGEN 1.0 11/13/2014 0155   NITRITE NEGATIVE 06/27/2018 0509    LEUKOCYTESUR NEGATIVE 06/27/2018 0509    Anemia Panel: No results for input(s): VITAMINB12, FOLATE, FERRITIN, TIBC, IRON, RETICCTPCT in the last 72 hours.  Thyroid Function Tests: No results for input(s): TSH, T4TOTAL, FREET4, T3FREE, THYROIDAB in the last 72 hours.  Lipid Profile: No results for input(s): CHOL, HDL, LDLCALC, TRIG, CHOLHDL, LDLDIRECT in the last 72 hours.  CBG: Recent Labs  Lab 08/20/18 0006 08/20/18 0402 08/20/18 0723 08/20/18 1112 08/20/18 1746  GLUCAP 277* 204* 123* 262* 376*    HbA1C: No results for input(s): HGBA1C in the last 72 hours.  BNP (last 3 results): No results for input(s): PROBNP in the last 8760 hours.  Cardiac Enzymes: No results for input(s): CKTOTAL, CKMB, CKMBINDEX, TROPONINI in the last 168 hours.  Coagulation Profile: No results for input(s): INR, PROTIME in the last 168 hours.  Liver Function Tests: Recent Labs  Lab 08/20/18 0527  ALBUMIN 3.5   No results for input(s): LIPASE, AMYLASE in the last 168 hours. No results for input(s): AMMONIA in the last 168 hours.  Basic Metabolic Panel: Recent Labs  Lab 08/19/18 2317 08/20/18 0527  NA 126* 130*  K 3.0* 2.6*  CL 79* 81*  CO2 31 31  GLUCOSE 240* 186*  BUN 84* 86*  CREATININE 8.46* 8.54*  CALCIUM 9.3 9.4  MG 3.4*  --   PHOS  --  5.6*   GFR: Estimated Creatinine Clearance: 13.3 mL/min (A) (by C-G formula based on SCr of 8.54 mg/dL (H)).  CBC: Recent Labs  Lab 08/19/18 2317 08/20/18 0527  WBC 18.3* 14.8*  HGB 13.6 13.3  HCT 39.7 39.2  MCV 87.6 89.3  PLT 313 314    Procedures:  Dialysis on 7/14  Microbiology summarized: COVID-19 negative.  Assessment & Plan: Intractable nausea/vomiting/abdominal pain: Likely due to gastroparesis.  Had some azotemia to 86 but doubt this to be contributing to his GI symptoms.  Tolerated renal diet for lunch.  Patient has not had emesis since admission. -Reglan 10 mg 3 times daily as needed for nausea and vomiting   ESRD on TTS HD/azotemia/bone mineral disorder: Missed dialysis for about a week.   -Dialysis today. -Patient to resume outpatient dialysis -Follow-up with outpatient nephrology  Hypokalemia: Potassium 2.6.  -Gave K-Dur 40 mEq once -Further correlation with dialysis.  Hyperphosphatemia/hypomagnesemia: Likely due to renal failure. -Per nephrology  Poorly controlled DM-2 with hyperglycemia renal complication: Reportedly admitted for DKA on 08/17/2018 at [place], mealtime and SSI insulin -CBG monitoring  Chronic back pain/history of substance use disorder: Narcotic database reviewed and noted that he is on Suboxone. -Discontinue Percocet and Dilaudid -Resume home Suboxone.  Hypertension/tachycardia: BP elevated. -Resume home labetalol, amlodipine and clonidine.  DVT prophylaxis: Subcu heparin Code Status: Full code Family Communication: Patient and/or RN. Available if any question.  Disposition Plan: Remains inpatient.  Anticipate discharge home on 7/15. Consultants: Nephrology for dialysis   Antimicrobials: Anti-infectives (From admission, onward)   None      Sch Meds:  Scheduled Meds: . amLODipine  10 mg Oral Daily  . buprenorphine-naloxone  1 tablet Sublingual Daily  . Chlorhexidine Gluconate Cloth  6 each Topical Q0600  . [START ON 08/23/2018] cloNIDine  0.3 mg Transdermal Q Fri  . heparin  5,000 Units Subcutaneous Q8H  . insulin aspart  0-15 Units Subcutaneous TID WC  . insulin aspart  0-5 Units Subcutaneous QHS  . [START ON 08/21/2018] insulin aspart  10 Units Subcutaneous TID WC  . insulin glargine  20 Units Subcutaneous BID  . labetalol  300 mg Oral TID  . metoCLOPramide (REGLAN) injection  10 mg Intravenous Q6H  . pantoprazole  40 mg Oral Daily  . sodium chloride flush  3 mL Intravenous Q12H   Continuous Infusions: PRN Meds:.acetaminophen **OR** acetaminophen, alum & mag hydroxide-simeth, hydrALAZINE, ondansetron (ZOFRAN) IV   Veron Senner T.  [place]  If 7PM-7AM, please contact night-coverage [email or web address] Password [facility] 08/20/2018, 6:07 PM

## 2018-08-20 NOTE — Progress Notes (Signed)
Received care of pt as a transfer via EMS from [facility]. While completing assessment pt, requested Morphine IV for his back, admitting MD paged. MD arrived on unit to assess and write orders on admissions. During this time pt continually put on call light shouting at staff that he's been waiting hours for pain medicine. Writer explained to patient that MD was finishing up with another patient and would be in shortly. Pt shouted "You said that last time!", writer asked patient what are his home pain medications, he replied, I don't have any. But when I'm in the other hospital, they just give me Morphine IV. While the MD, finished his first admission orders, writer requested security to accompany MD and be present while his admission assessment was completed. Pt was given Dilaudid 0.5mg  IV for c/o back pain x2, Benadryl 25mg  IV x1 for c/o insominia, then patient later awakened at 0400 during his accucheck and VS, and c/o to the tech that he couldn't sleep and needed something to help him sleep. Writer went in to speak to patient, pt shouted "I need Haldol, to go to sleep!",  Writer contacted the provider on call who ordered  x1 dose of Ativan 0.5, when writer went to let patient know he stated "I want pain medicine", Writer explained to patient that he could have either the Ativan or the Dilaudid but not both. Pt stated he would have the Dilaudid for pain, but wanted to know why he couldn't have both, writer explained to patient what could happen if he were over sedated. Pt requested that writer not dilute the Dilaudid, in saline before administering it, Probation officer stated it is our policy to dilute IV narcotics with normal saline. Pt replied, "Ok". Then patient wanted to know how soon he'd be able to have the Ativan, Probation officer replied in 2hrs. He again replied, "okay". On call MD notified and updated regarding patient's interest in seeking narcotics. Will continue to monitor. Pt given the x1 dose of ativan  with 0600 meds.

## 2018-08-20 NOTE — Procedures (Signed)
Patient was seen on dialysis and the procedure was supervised.  BFR 350  Via TDC BP is  155/97.   Patient appears to be tolerating treatment well- catheter dressing is dingy.  Is asking about signing off of dialysis early- and wanting to go home- feels better   Louis Meckel 08/20/2018

## 2018-08-20 NOTE — Progress Notes (Addendum)
Brent Daniels [id number] [birth year]  Mr. [name] is an ESRD patient on HD TTS at [facility]. Patient was admitted to [facility] on 7/11 for persistent n/v 2/2 gastroparesis. He was transferred today to [facility] d/t dialysis needs and admitted for observation.  Nephrology service aware and orders written for HD today per regular schedule.  Will contact OP center for his regular orders.  If patient status is changed to inpatient will complete formal consult.   Jen Mow, PA-C [facility] Pager: [phone]  Patient seen and examined, agree with above note with above modifications. I saw him on HD- says he is feeling better and wants to go home.  Asking about signing off of dialysis early  Corliss Parish, MD 08/20/2018

## 2018-08-21 LAB — GLUCOSE, CAPILLARY
Glucose-Capillary: 155 mg/dL — ABNORMAL HIGH (ref 70–99)
Glucose-Capillary: 255 mg/dL — ABNORMAL HIGH (ref 70–99)
Glucose-Capillary: 295 mg/dL — ABNORMAL HIGH (ref 70–99)

## 2018-08-21 LAB — HEPATITIS B SURFACE ANTIGEN: Hepatitis B Surface Ag: NEGATIVE

## 2018-08-21 MED ORDER — LABETALOL HCL 200 MG PO TABS: 200.0000 mg | ORAL_TABLET | Freq: Three times a day (TID) | ORAL | 0 refills | Status: AC

## 2018-08-21 MED ORDER — LEVEMIR FLEXTOUCH 100 UNIT/ML ~~LOC~~ SOPN: PEN_INJECTOR | SUBCUTANEOUS | 11 refills | Status: AC

## 2018-08-21 MED ORDER — ZOLPIDEM TARTRATE 5 MG PO TABS
5.0000 mg | ORAL_TABLET | Freq: Once | ORAL | Status: AC
  Administered 2018-08-21: 5 mg via ORAL
  Filled 2018-08-21: qty 1

## 2018-08-21 NOTE — Discharge Summary (Signed)
Physician Discharge Summary  Brent Daniels HYW:[id number] DOB: 1986-01-23 DOA: 08/19/2018  PCP: Glenda Chroman, MD  Admit date: 08/19/2018 Discharge date: 08/21/2018  Admitted From: Home  Disposition:  Home   Recommendations for Outpatient Follow-up:  1. Follow up with PCP in 1-2 weeks 2. Please obtain BMP/CBC in one week 3. Encourage compliance with medications and HD   Discharge Condition: stable CODE STATUS: full code Diet recommendation: carb modified  Brief/Interim Summary: 33 y.o.malewith medical history significant forESRD on TTS HD, insulin-dependent type 2 diabetes, hypertension, chronic pain, substance use disorder who is transferred to [facility] from [facility] for dialysis needs.  Initially admitted for DKA to [facility].  Continues to have intractable nausea/vomiting/abdominal pain.  Transferred here for dialysis.  Has not had dialysis in a week.   Intractable nausea/vomiting/abdominal pain: Likely due to gastroparesis. Had some azotemia to 86 but doubt this tobe contributing to his GI symptoms. Tolerated renal diet for lunch.Patient has not had emesis since admission. -Reglan 10 mg 3 times daily as needed for nausea and vomiting -Resolved, tolerating diet. Denies abdominal pain.  Stable for discharge/  ESRD on TTS HD/azotemia/bone mineral disorder: Missed dialysis for about a week.  -Dialysis today. -Patient to resume outpatient dialysis -Follow-up with outpatient nephrology  Hypokalemia:Potassium 2.6. -Gave K-Dur 40 mEq once -Further correlation with dialysis.  Hyperphosphatemia/hypomagnesemia: Likely due to renal failure. -Per nephrology  Poorly controlled DM-2 with hyperglycemia renal complication:Reportedly admitted for DKA on 08/17/2018 at [facility]. -Basal, mealtime and SSI insulin -CBG monitoring discharge on levemir BID 20 units.   Chronic back pain/history of substance use disorder: Narcotic database reviewed and noted  that he is on Suboxone. -Discontinue Percocet and Dilaudid -Resume home Suboxone.  Hypertension/tachycardia: BP elevated. -Resume home labetalol, amlodipine and clonidine.   Discharge Diagnoses:  Principal Problem:   Nausea and vomiting Active Problems:   Diabetic gastroparesis (HCC)   ESRD (end stage renal disease) on dialysis (HCC)   Diabetes type 2, uncontrolled ([place])   Uncontrolled hypertension   Gastroparesis due to DM [facility])    Discharge Instructions  Discharge Instructions    Call MD for:  difficulty breathing, headache or visual disturbances   Complete by: As directed    Call MD for:  extreme fatigue   Complete by: As directed    Call MD for:  persistant dizziness or light-headedness   Complete by: As directed    Call MD for:  persistant nausea and vomiting   Complete by: As directed    Call MD for:  severe uncontrolled pain   Complete by: As directed    Call MD for:  temperature >100.4   Complete by: As directed    Diet - low sodium heart healthy   Complete by: As directed    Diet Carb Modified   Complete by: As directed    Diet Carb Modified   Complete by: As directed    Discharge instructions   Complete by: As directed    It has been a pleasure taking care of you! You were hospitalized due to persistent nausea, vomiting and abdominal pain which is likely due to gastroparesis from poorly controlled diabetes.  Gastroparesis could be exacerbated by chronic pain medications.  We have started you on Reglan for nausea and vomiting.  You may adjust your diet consistency based on your symptoms.  We made some adjustments to your medications including your diabetic medications until you fully recover.  Please review your new medication list and the directions before you take  your medications. Please call your primary care office as soon as possible to schedule hospital follow-up visit in 1 to 2 weeks.  You may follow the directions below for your insulin until you  recover fully.   It is very important that you eat your meals regularly and watch the amount of carbohydrate you eat.   Check your blood glucose 4 times a day (before meals and at bedtime), and write them down. Always inject 5 units of Levemir twice a day (before breakfast and dinner) Correction coverage during day as below Glucose < 70: implement hypoglycemia protocol Glucose 70 - 120: 0 units Glucose 121 - 150: 2 units Glucose 151 - 200: 3 units Glucose 201 - 250: 5 units Glucose 251 - 300: 8 units Glucose 301 - 350: 11 units Glucose 351 - 400: 15 units and call your doctor or go to emergency department  5.  Correction coverage at bedtime (9pm) Glucose < 70: implement hypoglycemia protocol Glucose  70 - 120: 0 units Glucose 121 - 150: 0 units Glucose 151 - 200: 0 units Glucose 201 - 250: 2 units Glucose 251 - 300: 3 units Glucose 301 - 350: 4 units and call you doctor or go to emergency department   Increase activity slowly   Complete by: As directed    Increase activity slowly   Complete by: As directed      Allergies as of 08/21/2018      Reactions   Nsaids Nausea And Vomiting   Tramadol Anaphylaxis, Other (See Comments), Nausea Only   Acid Reflux Acid Reflux   Ibuprofen Other (See Comments), Nausea And Vomiting   Stomach ulcers   Naproxen Other (See Comments)   Stomach ulcers Stomach ulcers   Sulfa Antibiotics Rash, Other (See Comments)   Stomach ulcers   Hydrocodone Bitartrate Er    Anaphylaxis Can take plain Tylenol   Ketorolac Other (See Comments)   Unknown      Medication List    STOP taking these medications   amoxicillin-clavulanate 500-125 MG tablet Commonly known as: AUGMENTIN   Basaglar KwikPen 100 UNIT/ML Sopn   carvedilol 25 MG tablet Commonly known as: COREG   oxyCODONE 5 MG immediate release tablet Commonly known as: Oxy IR/ROXICODONE     TAKE these medications   acetaminophen 500 MG tablet Commonly known as: TYLENOL Take 1 tablet  (500 mg total) by mouth every 6 (six) hours as needed for mild pain, moderate pain, fever or headache.   alum & mag hydroxide-simeth 200-200-20 MG/5ML suspension Commonly known as: MAALOX/MYLANTA Take 30 mLs by mouth every 6 (six) hours as needed for indigestion or heartburn.   amLODipine 10 MG tablet Commonly known as: NORVASC Take 10 mg by mouth daily. What changed: Another medication with the same name was removed. Continue taking this medication, and follow the directions you see here.   Bayer Microlet Lancets lancets Use as instructed to test sugar 5 times daily   bisacodyl 10 MG suppository Commonly known as: DULCOLAX Place 1 suppository (10 mg total) rectally daily as needed for moderate constipation.   Buprenorphine HCl-Naloxone HCl 8-2 MG Film Place 1 Film under the tongue 3 (three) times daily. What changed: Another medication with the same name was removed. Continue taking this medication, and follow the directions you see here.   calcitRIOL 0.25 MCG capsule Commonly known as: ROCALTROL Take 1 capsule (0.25 mcg total) by mouth daily.   calcium acetate 667 MG capsule Commonly known as: PHOSLO Take 667 mg by  mouth 3 (three) times daily with meals.   cloNIDine 0.3 mg/24hr patch Commonly known as: CATAPRES - Dosed in mg/24 hr Place 1 patch (0.3 mg total) onto the skin once a week. Change on Friday's   collagenase ointment Commonly known as: SANTYL Apply topically daily.   Dressing Sponges 4"X4" Pads Provide 1 month supply for daily dressing - Kerlix 4.5 inch-wide roll gauze(s)  Cover with dry dressing (gauze or ABD pads) & tape, daily change   DULoxetine 30 MG capsule Commonly known as: CYMBALTA Take 30 mg by mouth 2 (two) times daily.   folic acid 1 MG tablet Commonly known as: FOLVITE Take 1 tablet (1 mg total) by mouth daily.   FreeStyle Libre 14 Day Reader Kerrin Mo 1 each by Does not apply route every 14 (fourteen) days.   furosemide 40 MG tablet Commonly  known as: LASIX Take 40 mg by mouth daily.   gabapentin 300 MG capsule Commonly known as: NEURONTIN Take 1 capsule (300 mg total) by mouth at bedtime.   glucose blood test strip Commonly known as: Contour Next Test Use as instructed to test sugar 5 times daily   glucose blood test strip Commonly known as: OneTouch Verio Use to test blood sugar two times daily   insulin lispro 100 UNIT/ML KwikPen Commonly known as: HumaLOG KwikPen INJECT 10 UNITS INTO THE SKIN THREE TIMES DAILY BEFORE MEALS (this is a 61 DAY supply) What changed: Another medication with the same name was changed. Make sure you understand how and when to take each.   HumaLOG KwikPen 100 UNIT/ML KwikPen Generic drug: insulin lispro Inject 0-0.15 mLs (0-15 Units total) into the skin 4 (four) times daily - after meals and at bedtime. What changed:   how much to take  when to take this  additional instructions   Insulin Pen Needle 31G X 8 MM Misc Commonly known as: CareTouch Pen Needles To use with Basaglar and Humalog pens, 4 times daily   NovoFine 32G X 6 MM Misc Generic drug: Insulin Pen Needle USE TO INJECT INSULIN FIVE TIMES DAILY   labetalol 200 MG tablet Commonly known as: NORMODYNE Take 1 tablet (200 mg total) by mouth 3 (three) times daily. What changed: when to take this   Levemir FlexTouch 100 UNIT/ML Pen Generic drug: Insulin Detemir Inject 20 units Twice a day. What changed:   how much to take  how to take this  when to take this  additional instructions   metoCLOPramide 5 MG tablet Commonly known as: REGLAN Take 1 tablet (5 mg total) by mouth 3 (three) times daily before meals. What changed: Another medication with the same name was added. Make sure you understand how and when to take each.   metoCLOPramide 10 MG tablet Commonly known as: REGLAN Take 1 tablet (10 mg total) by mouth 3 (three) times daily with meals for 10 days. What changed: You were already taking a medication  with the same name, and this prescription was added. Make sure you understand how and when to take each.   pantoprazole 40 MG tablet Commonly known as: PROTONIX Take 1 tablet (40 mg total) by mouth 2 (two) times daily before a meal. What changed:   when to take this  Another medication with the same name was removed. Continue taking this medication, and follow the directions you see here.   promethazine 25 MG tablet Commonly known as: PHENERGAN Take 25 mg by mouth 3 (three) times daily as needed for nausea or vomiting. What  changed: Another medication with the same name was removed. Continue taking this medication, and follow the directions you see here.   tiZANidine 4 MG tablet Commonly known as: ZANAFLEX Take 2 mg by mouth every 6 (six) hours as needed for muscle spasms.      Follow-up Information    Vyas, Dhruv B, MD. Schedule an appointment as soon as possible for a visit in 1 week(s).   Specialty: Internal Medicine Contact information: [place] [id number] [phone]          Allergies  Allergen Reactions  . Nsaids Nausea And Vomiting  . Tramadol Anaphylaxis, Other (See Comments) and Nausea Only    Acid Reflux Acid Reflux  . Ibuprofen Other (See Comments) and Nausea And Vomiting    Stomach ulcers  . Naproxen Other (See Comments)    Stomach ulcers Stomach ulcers  . Sulfa Antibiotics Rash and Other (See Comments)    Stomach ulcers   . Hydrocodone Bitartrate Er     Anaphylaxis Can take plain Tylenol  . Ketorolac Other (See Comments)    Unknown    Consultations:  Nephrology    Procedures/Studies:  No results found.   Subjective:   Discharge Exam: Vitals:   08/20/18 2348 08/21/18 0830  BP: (!) 148/86 (!) 120/98  Pulse:  88  Resp:  18  Temp:  98.2 F (36.8 C)  SpO2:  100%     General: Pt is alert, awake, not in acute distress Cardiovascular: RRR, S1/S2 +, no rubs, no gallops Respiratory: CTA bilaterally, no wheezing, no  rhonchi Abdominal: Soft, NT, ND, bowel sounds + Extremities: no edema, no cyanosis    The results of significant diagnostics from this hospitalization (including imaging, microbiology, ancillary and laboratory) are listed below for reference.     Microbiology: Recent Results (from the past 240 hour(s))  SARS Coronavirus 2 (CEPHEID - Performed in [place] hospital lab), Hosp Order     Status: None   Collection Time: 08/19/18 11:08 PM   Specimen: Nasopharyngeal Swab  Result Value Ref Range Status   SARS Coronavirus 2 NEGATIVE NEGATIVE Final    Comment: (NOTE) If result is NEGATIVE SARS-CoV-2 target nucleic acids are NOT DETECTED. The SARS-CoV-2 RNA is generally detectable in upper and lower  respiratory specimens during the acute phase of infection. The lowest  concentration of SARS-CoV-2 viral copies this assay can detect is 250  copies / mL. A negative result does not preclude SARS-CoV-2 infection  and should not be used as the sole basis for treatment or other  patient management decisions.  A negative result may occur with  improper specimen collection / handling, submission of specimen other  than nasopharyngeal swab, presence of viral mutation(s) within the  areas targeted by this assay, and inadequate number of viral copies  (<250 copies / mL). A negative result must be combined with clinical  observations, patient history, and epidemiological information. If result is POSITIVE SARS-CoV-2 target nucleic acids are DETECTED. The SARS-CoV-2 RNA is generally detectable in upper and lower  respiratory specimens dur ing the acute phase of infection.  Positive  results are indicative of active infection with SARS-CoV-2.  Clinical  correlation with patient history and other diagnostic information is  necessary to determine patient infection status.  Positive results do  not rule out bacterial infection or co-infection with other viruses. If result is PRESUMPTIVE  POSTIVE SARS-CoV-2 nucleic acids MAY BE PRESENT.   A presumptive positive result was obtained on the submitted specimen  and confirmed on repeat testing.  While 2019 novel coronavirus  (SARS-CoV-2) nucleic acids may be present in the submitted sample  additional confirmatory testing may be necessary for epidemiological  and / or clinical management purposes  to differentiate between  SARS-CoV-2 and other Sarbecovirus currently known to infect humans.  If clinically indicated additional testing with an alternate test  methodology [phone]) is advised. The SARS-CoV-2 RNA is generally  detectable in upper and lower respiratory sp ecimens during the acute  phase of infection. The expected result is Negative. Fact Sheet for Patients:  StrictlyIdeas.no Fact Sheet for Healthcare Providers: BankingDealers.co.za This test is not yet approved or cleared by the [place] FDA and has been authorized for detection and/or diagnosis of SARS-CoV-2 by FDA under an Emergency Use Authorization (EUA).  This EUA will remain in effect (meaning this test can be used) for the duration of the COVID-19 declaration under Section 564(b)(1) of the Act, 21 U.S.C. section 360bbb-3(b)(1), unless the authorization is terminated or revoked sooner. Performed at [facility], [place] [address] [place], [place] [address]      Labs: BNP (last 3 results) No results for input(s): BNP in the last 8760 hours. Basic Metabolic Panel: Recent Labs  Lab 08/19/18 2317 08/20/18 0527 08/20/18 1852  NA 126* 130* 129*  K 3.0* 2.6* 3.5  CL 79* 81* 91*  CO2 31 31 24   GLUCOSE 240* 186* 385*  BUN 84* 86* 52*  CREATININE 8.46* 8.54* 6.17*  CALCIUM 9.3 9.4 8.7*  MG 3.4*  --   --   PHOS  --  5.6*  --    Liver Function Tests: Recent Labs  Lab 08/20/18 0527  ALBUMIN 3.5   No results for input(s): LIPASE, AMYLASE in the last 168 hours. No results for input(s): AMMONIA  in the last 168 hours. CBC: Recent Labs  Lab 08/19/18 2317 08/20/18 0527  WBC 18.3* 14.8*  HGB 13.6 13.3  HCT 39.7 39.2  MCV 87.6 89.3  PLT 313 314   Cardiac Enzymes: No results for input(s): CKTOTAL, CKMB, CKMBINDEX, TROPONINI in the last 168 hours. BNP: Invalid input(s): POCBNP CBG: Recent Labs  Lab 08/20/18 1746 08/20/18 2026 08/21/18 0002 08/21/18 0244 08/21/18 0826  GLUCAP 376* 228* 155* 295* 255*   D-Dimer No results for input(s): DDIMER in the last 72 hours. Hgb A1c No results for input(s): HGBA1C in the last 72 hours. Lipid Profile No results for input(s): CHOL, HDL, LDLCALC, TRIG, CHOLHDL, LDLDIRECT in the last 72 hours. Thyroid function studies No results for input(s): TSH, T4TOTAL, T3FREE, THYROIDAB in the last 72 hours.  Invalid input(s): FREET3 Anemia work up No results for input(s): VITAMINB12, FOLATE, FERRITIN, TIBC, IRON, RETICCTPCT in the last 72 hours. Urinalysis    Component Value Date/Time   COLORURINE STRAW (A) 06/27/2018 0509   APPEARANCEUR CLEAR 06/27/2018 0509   LABSPEC 1.010 06/27/2018 0509   PHURINE 7.0 06/27/2018 0509   GLUCOSEU >=500 (A) 06/27/2018 0509   HGBUR SMALL (A) 06/27/2018 0509   BILIRUBINUR NEGATIVE 06/27/2018 0509   KETONESUR 5 (A) 06/27/2018 0509   PROTEINUR >=300 (A) 06/27/2018 0509   UROBILINOGEN 1.0 11/13/2014 0155   NITRITE NEGATIVE 06/27/2018 0509   LEUKOCYTESUR NEGATIVE 06/27/2018 0509   Sepsis Labs Invalid input(s): PROCALCITONIN,  WBC,  LACTICIDVEN Microbiology Recent Results (from the past 240 hour(s))  SARS Coronavirus 2 (CEPHEID - Performed in [place] hospital lab), Hosp Order     Status: None   Collection Time: 08/19/18 11:08 PM   Specimen:  Nasopharyngeal Swab  Result Value Ref Range Status   SARS Coronavirus 2 NEGATIVE NEGATIVE Final    Comment: (NOTE) If result is NEGATIVE SARS-CoV-2 target nucleic acids are NOT DETECTED. The SARS-CoV-2 RNA is generally detectable in upper and lower   respiratory specimens during the acute phase of infection. The lowest  concentration of SARS-CoV-2 viral copies this assay can detect is 250  copies / mL. A negative result does not preclude SARS-CoV-2 infection  and should not be used as the sole basis for treatment or other  patient management decisions.  A negative result may occur with  improper specimen collection / handling, submission of specimen other  than nasopharyngeal swab, presence of viral mutation(s) within the  areas targeted by this assay, and inadequate number of viral copies  (<250 copies / mL). A negative result must be combined with clinical  observations, patient history, and epidemiological information. If result is POSITIVE SARS-CoV-2 target nucleic acids are DETECTED. The SARS-CoV-2 RNA is generally detectable in upper and lower  respiratory specimens dur ing the acute phase of infection.  Positive  results are indicative of active infection with SARS-CoV-2.  Clinical  correlation with patient history and other diagnostic information is  necessary to determine patient infection status.  Positive results do  not rule out bacterial infection or co-infection with other viruses. If result is PRESUMPTIVE POSTIVE SARS-CoV-2 nucleic acids MAY BE PRESENT.   A presumptive positive result was obtained on the submitted specimen  and confirmed on repeat testing.  While 2019 novel coronavirus  (SARS-CoV-2) nucleic acids may be present in the submitted sample  additional confirmatory testing may be necessary for epidemiological  and / or clinical management purposes  to differentiate between  SARS-CoV-2 and other Sarbecovirus currently known to infect humans.  If clinically indicated additional testing with an alternate test  methodology [phone]) is advised. The SARS-CoV-2 RNA is generally  detectable in upper and lower respiratory sp ecimens during the acute  phase of infection. The expected result is Negative. Fact  Sheet for Patients:  StrictlyIdeas.no Fact Sheet for Healthcare Providers: BankingDealers.co.za This test is not yet approved or cleared by the [place] FDA and has been authorized for detection and/or diagnosis of SARS-CoV-2 by FDA under an Emergency Use Authorization (EUA).  This EUA will remain in effect (meaning this test can be used) for the duration of the COVID-19 declaration under Section 564(b)(1) of the Act, 21 U.S.C. section 360bbb-3(b)(1), unless the authorization is terminated or revoked sooner. Performed at [facility], [place] [address], [place], [place] [address]      Time coordinating discharge: 40 minutes  SIGNED:   Elmarie Shiley, MD  [facility]

## 2018-08-21 NOTE — Progress Notes (Signed)
Subjective:  No more N/V-  Ready for discharge today.  HD yesterday- removed 1600 - tolerated fine- ending weight 78 but dont know his EDW  Objective Vital signs in last 24 hours: Vitals:   08/20/18 2100 08/20/18 2300 08/20/18 2348 08/21/18 0500  BP: (!) 165/87 (!) 172/86 (!) 148/86   Pulse: (!) 101 (!) 101    Resp:  18    Temp:  97.8 F (36.6 C)    TempSrc:  Oral    SpO2:  98%    Weight:    82.3 kg  Height:       Weight change: 4 kg  Intake/Output Summary (Last 24 hours) at 08/21/2018 7591 Last data filed at 08/21/2018 0200 Gross per 24 hour  Intake 483 ml  Output 2990 ml  Net -2507 ml    Assessment/ Plan: Pt is a 33 y.o. yo male with ESRD and DM who was admitted on 08/19/2018 with DKA and gastroparesis  Assessment/Plan: 1. N/V-  Resolved, now on reglan 2. ESRD - normally TTS but sounds as if non compliant-  HD yest- he tells me he will go to HD tomorrow as OP-  Has TDC 3. Anemia- not an issue 4. Secondary hyperparathyroidism- no meds, phos 5.6 5. HTN/volume- overall BP is a little high- on norvasc and clonidine patch- had not had consistent HD so that impacts as well  Louis Meckel    Labs: Basic Metabolic Panel: Recent Labs  Lab 08/19/18 2317 08/20/18 0527 08/20/18 1852  NA 126* 130* 129*  K 3.0* 2.6* 3.5  CL 79* 81* 91*  CO2 31 31 24   GLUCOSE 240* 186* 385*  BUN 84* 86* 52*  CREATININE 8.46* 8.54* 6.17*  CALCIUM 9.3 9.4 8.7*  PHOS  --  5.6*  --    Liver Function Tests: Recent Labs  Lab 08/20/18 0527  ALBUMIN 3.5   No results for input(s): LIPASE, AMYLASE in the last 168 hours. No results for input(s): AMMONIA in the last 168 hours. CBC: Recent Labs  Lab 08/19/18 2317 08/20/18 0527  WBC 18.3* 14.8*  HGB 13.6 13.3  HCT 39.7 39.2  MCV 87.6 89.3  PLT 313 314   Cardiac Enzymes: No results for input(s): CKTOTAL, CKMB, CKMBINDEX, TROPONINI in the last 168 hours. CBG: Recent Labs  Lab 08/20/18 1112 08/20/18 1746 08/20/18 2026  08/21/18 0002 08/21/18 0244  GLUCAP 262* 376* 228* 155* 295*    Iron Studies: No results for input(s): IRON, TIBC, TRANSFERRIN, FERRITIN in the last 72 hours. Studies/Results: No results found. Medications: Infusions:   Scheduled Medications: . amLODipine  10 mg Oral Daily  . buprenorphine-naloxone  1 tablet Sublingual Daily  . Chlorhexidine Gluconate Cloth  6 each Topical Q0600  . [START ON 08/23/2018] cloNIDine  0.3 mg Transdermal Q Fri  . heparin  5,000 Units Subcutaneous Q8H  . insulin aspart  0-15 Units Subcutaneous TID WC  . insulin aspart  0-5 Units Subcutaneous QHS  . insulin aspart  10 Units Subcutaneous TID WC  . insulin glargine  20 Units Subcutaneous BID  . labetalol  200 mg Oral TID  . metoCLOPramide (REGLAN) injection  10 mg Intravenous Q6H  . pantoprazole  40 mg Oral Daily  . sodium chloride flush  3 mL Intravenous Q12H    have reviewed scheduled and prn medications.  Physical Exam: General: NAD Heart: RRR Lungs: mostly clear Abdomen: soft, non tender Extremities: no edema Dialysis Access: right sided TDC     08/21/2018,8:19 AM  LOS: 2 days

## 2018-08-21 NOTE — TOC Initial Note (Signed)
Transition of Care [facility]) - Initial/Assessment Note    Patient Details  Name: Brent Daniels MRN: [id number] Date of Birth: 1985-03-20  Transition of Care [facility]) CM/SW Contact:    Zenon Mayo, RN Phone Number: 08/21/2018, 10:16 AM  Clinical Narrative:                 From home with parent, pta indep,Patient for discharge today, he has transportation, no issues with getting meds.  He states he will make his follow up apt with his MD.  He has no needs at this time  Expected Discharge Plan: Home/Self Care Barriers to Discharge: No Barriers Identified   Patient Goals and CMS Choice Patient states their goals for this hospitalization and ongoing recovery are:: to get back to HD and follow up with MD   Choice offered to / list presented to : NA  Expected Discharge Plan and Services Expected Discharge Plan: Home/Self Care In-house Referral: NA Discharge Planning Services: CM Consult   Living arrangements for the past 2 months: Single Family Home Expected Discharge Date: 08/21/18               DME Arranged: (NA)         HH Arranged: NA          Prior Living Arrangements/Services Living arrangements for the past 2 months: Single Family Home Lives with:: Parents Patient language and need for interpreter reviewed:: Yes Do you feel safe going back to the place where you live?: Yes      Need for Family Participation in Patient Care: No (Comment) Care giver support system in place?: No (comment)   Criminal Activity/Legal Involvement Pertinent to Current Situation/Hospitalization: No - Comment as needed  Activities of Daily Living Home Assistive Devices/Equipment: None ADL Screening (condition at time of admission) Patient's cognitive ability adequate to safely complete daily activities?: Yes Is the patient deaf or have difficulty hearing?: No Does the patient have difficulty seeing, even when wearing glasses/contacts?: No Does the patient have difficulty  concentrating, remembering, or making decisions?: No Patient able to express need for assistance with ADLs?: No Does the patient have difficulty dressing or bathing?: No Independently performs ADLs?: No Communication: Independent Dressing (OT): Independent Grooming: Independent Feeding: Independent Bathing: Independent Toileting: Independent In/Out Bed: Independent Walks in Home: Independent Does the patient have difficulty walking or climbing stairs?: No Weakness of Legs: None Weakness of Arms/Hands: None  Permission Sought/Granted                  Emotional Assessment Appearance:: Appears stated age Attitude/Demeanor/Rapport: Gracious Affect (typically observed): Appropriate Orientation: : Oriented to Self, Oriented to Place, Oriented to  Time, Oriented to Situation Alcohol / Substance Use: Tobacco Use Psych Involvement: No (comment)  Admission diagnosis:  DIABETIC ACIDOSIS RENAL FAILURE Patient Active Problem List   Diagnosis Date Noted  . Gastroparesis due to DM ([place]) 08/20/2018  . Nausea and vomiting 08/19/2018  . Uncontrolled hypertension 08/19/2018  . Intractable vomiting   . Acute respiratory failure ([place])   . Poor dentition   . FUO (fever of unknown origin)   . Seizures ([place]) 06/27/2018  . Hypertensive emergency 06/27/2018  . ESRD (end stage renal disease) on dialysis ([place]) 06/27/2018  . Diabetes type 2, uncontrolled ([place]) 06/27/2018  . Encephalopathy acute 06/26/2018  .  02/01/2018  . DKA (diabetic ketoacidosis) ([place]) 10/17/2017  . Epididymitis 10/17/2017  . Sepsis ([place]) 10/16/2017  . Cellulitis 10/16/2017  . Hydrocele 10/16/2017  . Intractable vomiting with nausea 05/30/2017  . Renal failure (ARF), acute on chronic (HCC) 05/30/2017  . Hyperkalemia  05/29/2017  . Current smoker 01/01/2017  . Acute renal failure ([place])   . ATN (acute tubular necrosis) ([place])   . Erosive esophagitis   . Uremia 11/16/2016  . Neuropathy 11/16/2016  . Vitamin D deficiency 11/16/2016  . CKD (chronic kidney disease), stage V ([place]) 11/16/2016  . Acute renal failure superimposed on chronic kidney disease ([place]) 08/27/2016  . Intractable vomiting   . Volume depletion 08/24/2016  . Narcotic withdrawal ([place]) 08/24/2016  . Hypochloremic alkalosis 08/24/2016  . Diabetic hyperosmolar non-ketotic state ([place]) 08/24/2016  . Chronic abdominal pain 08/24/2016  . Diabetic gastroparesis ([place]) 08/24/2016  . Uncontrolled type 1 diabetes mellitus with hyperglycemia, with long-term current use of insulin ([place]) 08/24/2016  . Dehydration   . Reflux esophagitis 05/14/2015  . Nausea & vomiting 11/13/2014  . AKI (acute kidney injury) ([place]) 11/13/2014  . Hyperglycemia due to type 1 diabetes mellitus ([place]) 04/24/2014  . RUQ abdominal pain 04/14/2014  . DKA (diabetic ketoacidoses) ([place]) 04/03/2014  . Gastroparesis 03/30/2014  . Drug abuse ([place]) 03/30/2014  . Tobacco use 03/30/2014  . HLD (hyperlipidemia) 03/30/2014  . Oropharyngeal candidiasis   . Metabolic acidosis   . Abdominal fluid collection   . Abdominal pain, acute   . Esophagitis 01/03/2014  . Nausea with vomiting   . Acute esophagitis   . Hyperglycemia 12/20/2013  . Abdominal pain 12/09/2013  . PUD (peptic ulcer disease) 10/29/2013  . Leukocytosis 09/29/2013  . Tachycardia 09/29/2013  . Intractable nausea and vomiting 09/28/2013  . Chronic generalized abdominal pain 09/28/2013  . Diabetic gastroparesis associated with type 1 diabetes mellitus ([place]) 09/28/2013  . Hematemesis 09/28/2013  . Diabetic neuropathy ([place]) 09/28/2013  . Abdominal pain, epigastric 08/19/2013  . Uncontrolled diabetes mellitus ([place]) 06/15/2013  . GERD (gastroesophageal reflux disease) 06/15/2013  . DKA, type 1 ([place]) 06/15/2013  .  Essential hypertension, benign 07/11/2012  . Diabetes mellitus with stage 5 chronic kidney disease ([place]) 07/11/2012  . Scoliosis 07/11/2012  . Back pain 07/11/2012  . S/P arthroscopic knee surgery 07/04/2011   PCP:  Glenda Chroman, MD Pharmacy:   [place], [place] [address] [place] [address] Phone: [phone] Fax: [phone]     Social Determinants of Health (SDOH) Interventions    Readmission Risk Interventions Readmission Risk Prevention Plan 08/21/2018  Transportation Screening Complete  Medication Review ([place]) Complete  PCP or Specialist appointment within 3-5 days of discharge Complete  HRI or [place] Complete  SW Recovery Care/Counseling Consult Complete  [place] Not Applicable  Some recent data might be hidden

## 2018-08-21 NOTE — TOC Transition Note (Signed)
Transition of Care [facility]) - CM/SW Discharge Note   Patient Details  Name: KERRICK MILER MRN: [id number] Date of Birth: [birth year]  Transition of Care [facility]) CM/SW Contact:  Zenon Mayo, RN Phone Number: 08/21/2018, 9:37 AM   Clinical Narrative:    Patient for discharge today, he has transportation, no issues with getting meds.  He states he will make his follow up apt with his MD.  He has no needs at this time.   Final next level of care: Home/Self Care Barriers to Discharge: No Barriers Identified   Patient Goals and CMS Choice Patient states their goals for this hospitalization and ongoing recovery are:: to get back on his HD and follow up with his doctor   Choice offered to / list presented to : NA  Discharge Placement                       Discharge Plan and Services                DME Arranged: (NA)         HH Arranged: NA          Social Determinants of Health (SDOH) Interventions     Readmission Risk Interventions Readmission Risk Prevention Plan 08/21/2018  Transportation Screening Complete  Medication Review Press photographer) Complete  PCP or Specialist appointment within 3-5 days of discharge Complete  HRI or [place] Complete  SW Recovery Care/Counseling Consult Complete  [place] Not Applicable  Some recent data might be hidden

## 2018-09-11 ENCOUNTER — Ambulatory Visit (INDEPENDENT_AMBULATORY_CARE_PROVIDER_SITE_OTHER): Payer: Medicare Other | Admitting: Vascular Surgery

## 2018-09-11 ENCOUNTER — Other Ambulatory Visit: Payer: Self-pay | Admitting: *Deleted

## 2018-09-11 ENCOUNTER — Other Ambulatory Visit: Payer: Self-pay

## 2018-09-11 ENCOUNTER — Ambulatory Visit (INDEPENDENT_AMBULATORY_CARE_PROVIDER_SITE_OTHER)
Admission: RE | Admit: 2018-09-11 | Discharge: 2018-09-11 | Disposition: A | Discharge status: Home / Self Care | Payer: Medicare Other | Source: Ambulatory Visit | Attending: Vascular Surgery | Admitting: Vascular Surgery

## 2018-09-11 ENCOUNTER — Ambulatory Visit (HOSPITAL_COMMUNITY)
Admission: RE | Admit: 2018-09-11 | Discharge: 2018-09-11 | Disposition: A | Discharge status: Home / Self Care | Payer: Medicare Other | Source: Ambulatory Visit | Attending: Vascular Surgery | Admitting: Vascular Surgery

## 2018-09-11 ENCOUNTER — Encounter: Payer: Self-pay | Admitting: Vascular Surgery

## 2018-09-11 ENCOUNTER — Encounter: Payer: Self-pay | Admitting: *Deleted

## 2018-09-11 ENCOUNTER — Encounter (HOSPITAL_COMMUNITY): Payer: Medicare Other

## 2018-09-11 ENCOUNTER — Inpatient Hospital Stay (HOSPITAL_COMMUNITY): Admission: RE | Admit: 2018-09-11 | Payer: Medicare Other | Source: Ambulatory Visit

## 2018-09-11 VITALS — BP 143/86 | HR 85 | Temp 97.2°F | Resp 20 | Ht 73.0 in | Wt 181.0 lb

## 2018-09-11 DIAGNOSIS — N186 End stage renal disease: Secondary | ICD-10-CM | POA: Diagnosis not present

## 2018-09-11 DIAGNOSIS — N185 Chronic kidney disease, stage 5: Secondary | ICD-10-CM | POA: Insufficient documentation

## 2018-09-11 DIAGNOSIS — Z992 Dependence on renal dialysis: Secondary | ICD-10-CM

## 2018-09-11 NOTE — H&P (View-Only) (Signed)
REASON FOR CONSULT:    To evaluate for hemodialysis access.  The consult is requested by Dr. Lowanda Foster  ASSESSMENT & PLAN:   END-STAGE RENAL DISEASE: This patient has a failed right brachiocephalic AV fistula.  He has a functioning right IJ tunneled dialysis catheter.  He dialyzes on Tuesdays Thursdays and Saturdays.  Based on his vein mapping I think his best chance for a fistula would be a basilic vein transposition on the left.  I have explained that we would do this in 1 or 2 stages depending upon the size of the vein.  If the vein is not adequate then we would place an AV graft.  I have discussed the indications for the procedure and the potential complications and he is agreeable to proceed.  We will schedule this on a nondialysis day on Friday, 09/27/2018.  Deitra Mayo, MD, FACS Beeper [phone] Office: [phone]   HPI:   Brent Daniels is a pleasant 33 y.o. male, who began hemodialysis in January of this year.  He dialyzes on Tuesdays Thursdays and Saturdays in [place] and has a functioning catheter.  His renal failure secondary to diabetes and hypertension.  He has had some nausea and vomiting which he attributes to his renal failure but also to gastroparesis.  He feels better after his dialysis sessions.  He does state that he is had some problems with hypotension during dialysis.  He does not have problems with hypotension otherwise.  He is right-handed.  He has not had a pacemaker.  He is not on any blood thinners.  In reviewing his records he had a right brachiocephalic fistula placed on 04/09/2017.  He states that this never worked adequately.  Prior to this it was noted that both forearm cephalic veins were very small.  Past Medical History:  Diagnosis Date  . Acute esophagitis   . Anemia   . Anxiety   . Arthritis   . Bicuspid aortic valve    noted on 10/20/16 [facility])  . Chronic abdominal pain   . Chronic back pain   . Chronic kidney disease    Stage 5 Kidney  disease  . Depression   . Diabetes mellitus    type 1  . Diabetes mellitus without complication ([place])   . Diabetic gastroparesis associated with type 1 diabetes mellitus ([place])   . Erosive esophagitis 12/23/2013   "severe" per EGD   . Gastroparesis   . GERD (gastroesophageal reflux disease)   . Headache    in the past  . Heart murmur    mild TR, mild MR, mild AS 02/19/17  . History of hiatal hernia   . Hypercholesteremia   . Hypertension   . Insomnia   . Nausea and vomiting    chronic, recurrent  . Neuropathy   . Pneumonia   . PUD (peptic ulcer disease)   . Renal disorder   . S/P arthroscopic knee surgery 07/04/2011  . Scoliosis 07/11/2012    Family History  Problem Relation Age of Onset  . Cancer Father   . Alcohol abuse Father   . Diabetes Father   . Diabetes Paternal Grandfather   . Pseudochol deficiency Neg Hx   . Malignant hyperthermia Neg Hx   . Hypotension Neg Hx   . Anesthesia problems Neg Hx   . Colon cancer Neg Hx     SOCIAL HISTORY: Social History   Socioeconomic History  . Marital status: Single    Spouse name: Not on file  . Number  of children: Not on file  . Years of education: 40  . Highest education level: Not on file  Occupational History  . Occupation: unemployed    Fish farm manager: UNEMPLOYED    Comment: trying to get disability  Social Needs  . Financial resource strain: Not on file  . Food insecurity    Worry: Not on file    Inability: Not on file  . Transportation needs    Medical: Not on file    Non-medical: Not on file  Tobacco Use  . Smoking status: Current Every Day Smoker    Years: 20.00    Types: Cigarettes  . Smokeless tobacco: Never Used  Substance and Sexual Activity  . Alcohol use: No  . Drug use: No  . Sexual activity: Not Currently  Lifestyle  . Physical activity    Days per week: Not on file    Minutes per session: Not on file  . Stress: Not on file  Relationships  . Social Herbalist on phone: Not on file     Gets together: Not on file    Attends religious service: Not on file    Active member of club or organization: Not on file    Attends meetings of clubs or organizations: Not on file    Relationship status: Not on file  . Intimate partner violence    Fear of current or ex partner: Not on file    Emotionally abused: Not on file    Physically abused: Not on file    Forced sexual activity: Not on file  Other Topics Concern  . Not on file  Social History Narrative   ** Merged History Encounter **        Allergies  Allergen Reactions  . Nsaids Nausea And Vomiting  . Tramadol Anaphylaxis, Other (See Comments) and Nausea Only    Acid Reflux Acid Reflux  . Ibuprofen Other (See Comments) and Nausea And Vomiting    Stomach ulcers  . Naproxen Other (See Comments)    Stomach ulcers Stomach ulcers  . Sulfa Antibiotics Rash and Other (See Comments)    Stomach ulcers   . Hydrocodone Bitartrate Er     Anaphylaxis Can take plain Tylenol  . Ketorolac Other (See Comments)    Unknown    Current Outpatient Medications  Medication Sig Dispense Refill  . acetaminophen (TYLENOL) 500 MG tablet Take 1 tablet (500 mg total) by mouth every 6 (six) hours as needed for mild pain, moderate pain, fever or headache. 30 tablet 0  . alum & mag hydroxide-simeth (MAALOX/MYLANTA) 200-200-20 MG/5ML suspension Take 30 mLs by mouth every 6 (six) hours as needed for indigestion or heartburn. 355 mL 0  . BAYER MICROLET LANCETS lancets Use as instructed to test sugar 5 times daily 500 each 2  . bisacodyl (DULCOLAX) 10 MG suppository Place 1 suppository (10 mg total) rectally daily as needed for moderate constipation. 12 suppository 0  . Buprenorphine HCl-Naloxone HCl 8-2 MG FILM Place 1 Film under the tongue 3 (three) times daily.    . cloNIDine (CATAPRES - DOSED IN MG/24 HR) 0.3 mg/24hr patch Place 1 patch (0.3 mg total) onto the skin once a week. Change on Friday's 4 patch 1  . collagenase (SANTYL) ointment  Apply topically daily. 15 g 0  . DULoxetine (CYMBALTA) 30 MG capsule Take 30 mg by mouth 2 (two) times daily.    . furosemide (LASIX) 40 MG tablet Take 40 mg by mouth daily.   1  .  glucose blood (CONTOUR NEXT TEST) test strip Use as instructed to test sugar 5 times daily 500 each 0  . glucose blood (ONETOUCH VERIO) test strip Use to test blood sugar two times daily 200 each 2  . HUMALOG KWIKPEN 100 UNIT/ML KwikPen Inject 0-0.15 mLs (0-15 Units total) into the skin 4 (four) times daily - after meals and at bedtime. 15 mL 11  . insulin lispro (HUMALOG KWIKPEN) 100 UNIT/ML KwikPen INJECT 10 UNITS INTO THE SKIN THREE TIMES DAILY BEFORE MEALS (this is a 50 DAY supply) 27 mL 2  . Insulin Pen Needle (CARETOUCH PEN NEEDLES) 31G X 8 MM MISC To use with Basaglar and Humalog pens, 4 times daily 400 each 0  . labetalol (NORMODYNE) 200 MG tablet Take 1 tablet (200 mg total) by mouth 3 (three) times daily. 60 tablet 0  . LEVEMIR FLEXTOUCH 100 UNIT/ML Pen Inject 20 units Twice a day. 15 mL 11  . metoCLOPramide (REGLAN) 5 MG tablet Take 1 tablet (5 mg total) by mouth 3 (three) times daily before meals. 90 tablet 0  . NOVOFINE 32G X 6 MM MISC USE TO INJECT INSULIN FIVE TIMES DAILY    . promethazine (PHENERGAN) 25 MG tablet Take 25 mg by mouth 3 (three) times daily as needed for nausea or vomiting.   1  . tiZANidine (ZANAFLEX) 4 MG tablet Take 2 mg by mouth every 6 (six) hours as needed for muscle spasms.     . calcitRIOL (ROCALTROL) 0.25 MCG capsule Take 1 capsule (0.25 mcg total) by mouth daily. (Patient not taking: Reported on 10/16/2017) 30 capsule 1  . calcium acetate (PHOSLO) 667 MG capsule Take 667 mg by mouth 3 (three) times daily with meals.  3  . Continuous Blood Gluc Receiver (FREESTYLE LIBRE 14 DAY READER) DEVI 1 each by Does not apply route every 14 (fourteen) days. (Patient not taking: Reported on 09/11/2018) 1 Device 0  . folic acid (FOLVITE) 1 MG tablet Take 1 tablet (1 mg total) by mouth daily. (Patient  not taking: Reported on 09/11/2018) 30 tablet 0  . gabapentin (NEURONTIN) 300 MG capsule Take 1 capsule (300 mg total) by mouth at bedtime. (Patient not taking: Reported on 10/16/2017)    . metoCLOPramide (REGLAN) 10 MG tablet Take 1 tablet (10 mg total) by mouth 3 (three) times daily with meals for 10 days. 30 tablet 0  . pantoprazole (PROTONIX) 40 MG tablet Take 1 tablet (40 mg total) by mouth 2 (two) times daily before a meal. (Patient taking differently: Take 40 mg by mouth daily. ) 60 tablet 0   No current facility-administered medications for this visit.     REVIEW OF SYSTEMS:  [X]  denotes positive finding, [ ]  denotes negative finding Cardiac  Comments:  Chest pain or chest pressure:    Shortness of breath upon exertion:    Short of breath when lying flat:    Irregular heart rhythm:        Vascular    Pain in calf, thigh, or hip brought on by ambulation:    Pain in feet at night that wakes you up from your sleep:     Blood clot in your veins:    Leg swelling:         Pulmonary    Oxygen at home:    Productive cough:     Wheezing:         Neurologic    Sudden weakness in arms or legs:     Sudden numbness in  arms or legs:     Sudden onset of difficulty speaking or slurred speech:    Temporary loss of vision in one eye:     Problems with dizziness:         Gastrointestinal    Blood in stool:     Vomited blood:         Genitourinary    Burning when urinating:     Blood in urine:        Psychiatric    Major depression:         Hematologic    Bleeding problems:    Problems with blood clotting too easily:        Skin    Rashes or ulcers:        Constitutional    Fever or chills:     PHYSICAL EXAM:   Vitals:   09/11/18 1436  BP: (!) 143/86  Pulse: 85  Resp: 20  Temp: (!) 97.2 F (36.2 C)  SpO2: 97%  Weight: 181 lb (82.1 kg)  Height: 6\' 1"  (1.854 m)    GENERAL: The patient is a well-nourished male, in no acute distress. The vital signs are documented  above. CARDIAC: There is a regular rate and rhythm.  VASCULAR: I do not detect carotid bruits. He has palpable brachial and radial pulses bilaterally. He has no significant lower extremity swelling. PULMONARY: There is good air exchange bilaterally without wheezing or rales. ABDOMEN: Soft and non-tender with normal pitched bowel sounds.  MUSCULOSKELETAL: There are no major deformities or cyanosis. NEUROLOGIC: No focal weakness or paresthesias are detected. SKIN: There are no ulcers or rashes noted. PSYCHIATRIC: The patient has a normal affect.  DATA:    VEIN MAP: I have independently interpreted his vein map today.  On the right side the forearm cephalic vein looks somewhat small.  The upper arm cephalic vein looks reasonable in size although smaller at the antecubital level.  The basilic vein looks reasonable in size.  On the left side the forearm and upper arm cephalic vein do not appear to be usable.  The basilic vein looks reasonable in size.  The it was  ARTERIAL DUPLEX: I have independently interpreted his arterial duplex scan today.  On the right side there is a biphasic radial signal with a triphasic ulnar signal.  The brachial artery measures 0.52 cm in diameter.  On the left side there is a biphasic radial and ulnar signal with the Doppler.  The brachial artery measures 0.53 cm in diameter.  LABS: His GFR is 11

## 2018-09-11 NOTE — Progress Notes (Addendum)
REASON FOR CONSULT:    To evaluate for hemodialysis access.  The consult is requested by Dr. Lowanda Foster  ASSESSMENT & PLAN:   END-STAGE RENAL DISEASE: This patient has a failed right brachiocephalic AV fistula.  He has a functioning right IJ tunneled dialysis catheter.  He dialyzes on Tuesdays Thursdays and Saturdays.  Based on his vein mapping I think his best chance for a fistula would be a basilic vein transposition on the left.  I have explained that we would do this in 1 or 2 stages depending upon the size of the vein.  If the vein is not adequate then we would place an AV graft.  I have discussed the indications for the procedure and the potential complications and he is agreeable to proceed.  We will schedule this on a nondialysis day on Friday, 09/27/2018.  Deitra Mayo, MD, FACS Beeper [phone] Office: [phone]   HPI:   Brent Daniels is a pleasant 33 y.o. male, who began hemodialysis in January of this year.  He dialyzes on Tuesdays Thursdays and Saturdays in [place] and has a functioning catheter.  His renal failure secondary to diabetes and hypertension.  He has had some nausea and vomiting which he attributes to his renal failure but also to gastroparesis.  He feels better after his dialysis sessions.  He does state that he is had some problems with hypotension during dialysis.  He does not have problems with hypotension otherwise.  He is right-handed.  He has not had a pacemaker.  He is not on any blood thinners.  In reviewing his records he had a right brachiocephalic fistula placed on 04/09/2017.  He states that this never worked adequately.  Prior to this it was noted that both forearm cephalic veins were very small.  Past Medical History:  Diagnosis Date  . Acute esophagitis   . Anemia   . Anxiety   . Arthritis   . Bicuspid aortic valve    noted on 10/20/16 TEE [facility])  . Chronic abdominal pain   . Chronic back pain   . Chronic kidney disease    Stage 5 Kidney  disease  . Depression   . Diabetes mellitus    type 1  . Diabetes mellitus without complication ([place])   . Diabetic gastroparesis associated with type 1 diabetes mellitus ([place])   . Erosive esophagitis 12/23/2013   "severe" per EGD   . Gastroparesis   . GERD (gastroesophageal reflux disease)   . Headache    in the past  . Heart murmur    mild TR, mild MR, mild AS 02/19/17  . History of hiatal hernia   . Hypercholesteremia   . Hypertension   . Insomnia   . Nausea and vomiting    chronic, recurrent  . Neuropathy   . Pneumonia   . PUD (peptic ulcer disease)   . Renal disorder   . S/P arthroscopic knee surgery 07/04/2011  . Scoliosis 07/11/2012    Family History  Problem Relation Age of Onset  . Cancer Father   . Alcohol abuse Father   . Diabetes Father   . Diabetes Paternal Grandfather   . Pseudochol deficiency Neg Hx   . Malignant hyperthermia Neg Hx   . Hypotension Neg Hx   . Anesthesia problems Neg Hx   . Colon cancer Neg Hx     SOCIAL HISTORY: Social History   Socioeconomic History  . Marital status: Single    Spouse name: Not on file  . Number  of children: Not on file  . Years of education: 61  . Highest education level: Not on file  Occupational History  . Occupation: unemployed    Fish farm manager: UNEMPLOYED    Comment: trying to get disability  Social Needs  . Financial resource strain: Not on file  . Food insecurity    Worry: Not on file    Inability: Not on file  . Transportation needs    Medical: Not on file    Non-medical: Not on file  Tobacco Use  . Smoking status: Current Every Day Smoker    Years: 20.00    Types: Cigarettes  . Smokeless tobacco: Never Used  Substance and Sexual Activity  . Alcohol use: No  . Drug use: No  . Sexual activity: Not Currently  Lifestyle  . Physical activity    Days per week: Not on file    Minutes per session: Not on file  . Stress: Not on file  Relationships  . Social Herbalist on phone: Not on file     Gets together: Not on file    Attends religious service: Not on file    Active member of club or organization: Not on file    Attends meetings of clubs or organizations: Not on file    Relationship status: Not on file  . Intimate partner violence    Fear of current or ex partner: Not on file    Emotionally abused: Not on file    Physically abused: Not on file    Forced sexual activity: Not on file  Other Topics Concern  . Not on file  Social History Narrative   ** Merged History Encounter **        Allergies  Allergen Reactions  . Nsaids Nausea And Vomiting  . Tramadol Anaphylaxis, Other (See Comments) and Nausea Only    Acid Reflux Acid Reflux  . Ibuprofen Other (See Comments) and Nausea And Vomiting    Stomach ulcers  . Naproxen Other (See Comments)    Stomach ulcers Stomach ulcers  . Sulfa Antibiotics Rash and Other (See Comments)    Stomach ulcers   . Hydrocodone Bitartrate Er     Anaphylaxis Can take plain Tylenol  . Ketorolac Other (See Comments)    Unknown    Current Outpatient Medications  Medication Sig Dispense Refill  . acetaminophen (TYLENOL) 500 MG tablet Take 1 tablet (500 mg total) by mouth every 6 (six) hours as needed for mild pain, moderate pain, fever or headache. 30 tablet 0  . alum & mag hydroxide-simeth (MAALOX/MYLANTA) 200-200-20 MG/5ML suspension Take 30 mLs by mouth every 6 (six) hours as needed for indigestion or heartburn. 355 mL 0  . BAYER MICROLET LANCETS lancets Use as instructed to test sugar 5 times daily 500 each 2  . bisacodyl (DULCOLAX) 10 MG suppository Place 1 suppository (10 mg total) rectally daily as needed for moderate constipation. 12 suppository 0  . Buprenorphine HCl-Naloxone HCl 8-2 MG FILM Place 1 Film under the tongue 3 (three) times daily.    . cloNIDine (CATAPRES - DOSED IN MG/24 HR) 0.3 mg/24hr patch Place 1 patch (0.3 mg total) onto the skin once a week. Change on Friday's 4 patch 1  . collagenase (SANTYL) ointment  Apply topically daily. 15 g 0  . DULoxetine (CYMBALTA) 30 MG capsule Take 30 mg by mouth 2 (two) times daily.    . furosemide (LASIX) 40 MG tablet Take 40 mg by mouth daily.   1  .  glucose blood (CONTOUR NEXT TEST) test strip Use as instructed to test sugar 5 times daily 500 each 0  . glucose blood (ONETOUCH VERIO) test strip Use to test blood sugar two times daily 200 each 2  . HUMALOG KWIKPEN 100 UNIT/ML KwikPen Inject 0-0.15 mLs (0-15 Units total) into the skin 4 (four) times daily - after meals and at bedtime. 15 mL 11  . insulin lispro (HUMALOG KWIKPEN) 100 UNIT/ML KwikPen INJECT 10 UNITS INTO THE SKIN THREE TIMES DAILY BEFORE MEALS (this is a 50 DAY supply) 27 mL 2  . Insulin Pen Needle (CARETOUCH PEN NEEDLES) 31G X 8 MM MISC To use with Basaglar and Humalog pens, 4 times daily 400 each 0  . labetalol (NORMODYNE) 200 MG tablet Take 1 tablet (200 mg total) by mouth 3 (three) times daily. 60 tablet 0  . LEVEMIR FLEXTOUCH 100 UNIT/ML Pen Inject 20 units Twice a day. 15 mL 11  . metoCLOPramide (REGLAN) 5 MG tablet Take 1 tablet (5 mg total) by mouth 3 (three) times daily before meals. 90 tablet 0  . NOVOFINE 32G X 6 MM MISC USE TO INJECT INSULIN FIVE TIMES DAILY    . promethazine (PHENERGAN) 25 MG tablet Take 25 mg by mouth 3 (three) times daily as needed for nausea or vomiting.   1  . tiZANidine (ZANAFLEX) 4 MG tablet Take 2 mg by mouth every 6 (six) hours as needed for muscle spasms.     . calcitRIOL (ROCALTROL) 0.25 MCG capsule Take 1 capsule (0.25 mcg total) by mouth daily. (Patient not taking: Reported on 10/16/2017) 30 capsule 1  . calcium acetate (PHOSLO) 667 MG capsule Take 667 mg by mouth 3 (three) times daily with meals.  3  . Continuous Blood Gluc Receiver (FREESTYLE LIBRE 14 DAY READER) DEVI 1 each by Does not apply route every 14 (fourteen) days. (Patient not taking: Reported on 09/11/2018) 1 Device 0  . folic acid (FOLVITE) 1 MG tablet Take 1 tablet (1 mg total) by mouth daily. (Patient  not taking: Reported on 09/11/2018) 30 tablet 0  . gabapentin (NEURONTIN) 300 MG capsule Take 1 capsule (300 mg total) by mouth at bedtime. (Patient not taking: Reported on 10/16/2017)    . metoCLOPramide (REGLAN) 10 MG tablet Take 1 tablet (10 mg total) by mouth 3 (three) times daily with meals for 10 days. 30 tablet 0  . pantoprazole (PROTONIX) 40 MG tablet Take 1 tablet (40 mg total) by mouth 2 (two) times daily before a meal. (Patient taking differently: Take 40 mg by mouth daily. ) 60 tablet 0   No current facility-administered medications for this visit.     REVIEW OF SYSTEMS:  [X]  denotes positive finding, [ ]  denotes negative finding Cardiac  Comments:  Chest pain or chest pressure:    Shortness of breath upon exertion:    Short of breath when lying flat:    Irregular heart rhythm:        Vascular    Pain in calf, thigh, or hip brought on by ambulation:    Pain in feet at night that wakes you up from your sleep:     Blood clot in your veins:    Leg swelling:         Pulmonary    Oxygen at home:    Productive cough:     Wheezing:         Neurologic    Sudden weakness in arms or legs:     Sudden numbness in  arms or legs:     Sudden onset of difficulty speaking or slurred speech:    Temporary loss of vision in one eye:     Problems with dizziness:         Gastrointestinal    Blood in stool:     Vomited blood:         Genitourinary    Burning when urinating:     Blood in urine:        Psychiatric    Major depression:         Hematologic    Bleeding problems:    Problems with blood clotting too easily:        Skin    Rashes or ulcers:        Constitutional    Fever or chills:     PHYSICAL EXAM:   Vitals:   09/11/18 1436  BP: (!) 143/86  Pulse: 85  Resp: 20  Temp: (!) 97.2 F (36.2 C)  SpO2: 97%  Weight: 181 lb (82.1 kg)  Height: 6\' 1"  (1.854 m)    GENERAL: The patient is a well-nourished male, in no acute distress. The vital signs are documented  above. CARDIAC: There is a regular rate and rhythm.  VASCULAR: I do not detect carotid bruits. He has palpable brachial and radial pulses bilaterally. He has no significant lower extremity swelling. PULMONARY: There is good air exchange bilaterally without wheezing or rales. ABDOMEN: Soft and non-tender with normal pitched bowel sounds.  MUSCULOSKELETAL: There are no major deformities or cyanosis. NEUROLOGIC: No focal weakness or paresthesias are detected. SKIN: There are no ulcers or rashes noted. PSYCHIATRIC: The patient has a normal affect.  DATA:    VEIN MAP: I have independently interpreted his vein map today.  On the right side the forearm cephalic vein looks somewhat small.  The upper arm cephalic vein looks reasonable in size although smaller at the antecubital level.  The basilic vein looks reasonable in size.  On the left side the forearm and upper arm cephalic vein do not appear to be usable.  The basilic vein looks reasonable in size.  The it was  ARTERIAL DUPLEX: I have independently interpreted his arterial duplex scan today.  On the right side there is a biphasic radial signal with a triphasic ulnar signal.  The brachial artery measures 0.52 cm in diameter.  On the left side there is a biphasic radial and ulnar signal with the Doppler.  The brachial artery measures 0.53 cm in diameter.  LABS: His GFR is 11

## 2018-09-25 ENCOUNTER — Other Ambulatory Visit (HOSPITAL_COMMUNITY)
Admission: RE | Admit: 2018-09-25 | Discharge: 2018-09-25 | Disposition: A | Discharge status: Home / Self Care | Payer: Medicare Other | Source: Ambulatory Visit | Attending: Vascular Surgery | Admitting: Vascular Surgery

## 2018-09-25 ENCOUNTER — Other Ambulatory Visit: Payer: Self-pay

## 2018-09-25 DIAGNOSIS — Z01812 Encounter for preprocedural laboratory examination: Secondary | ICD-10-CM | POA: Diagnosis present

## 2018-09-25 DIAGNOSIS — Z20828 Contact with and (suspected) exposure to other viral communicable diseases: Secondary | ICD-10-CM | POA: Insufficient documentation

## 2018-09-25 LAB — SARS CORONAVIRUS 2 (TAT 6-24 HRS): SARS Coronavirus 2: NEGATIVE

## 2018-09-26 ENCOUNTER — Encounter (HOSPITAL_COMMUNITY): Payer: Self-pay | Admitting: Physician Assistant

## 2018-09-26 ENCOUNTER — Other Ambulatory Visit: Payer: Self-pay

## 2018-09-26 NOTE — Anesthesia Preprocedure Evaluation (Addendum)
Anesthesia Evaluation  Patient identified by MRN, date of birth, ID band Patient awake    Reviewed: Allergy & Precautions, NPO status , Patient's Chart, lab work & pertinent test results, reviewed documented beta blocker date and time   History of Anesthesia Complications Negative for: history of anesthetic complications  Airway Mallampati: I  TM Distance: >3 FB Neck ROM: Full    Dental  (+) Dental Advisory Given, Poor Dentition   Pulmonary Current SmokerPatient did not abstain from smoking.,    Pulmonary exam normal        Cardiovascular hypertension, Pt. on medications and Pt. on home beta blockers (-) anginaNormal cardiovascular exam   '20 TTE - EF 55-60%. Moderate concentric left ventricular hypertrophy. Left ventricular diastolic Doppler parameters are consistent with impaired relaxation. Left atrial size was mildly dilated    Neuro/Psych  Headaches, Seizures -, Poorly Controlled,  PSYCHIATRIC DISORDERS Anxiety Depression    GI/Hepatic hiatal hernia, PUD, GERD  Medicated and Controlled,(+)     substance abuse  IV drug use,  Diabetic gastroparesis    Endo/Other  diabetes, Type 1, Insulin Dependent  Renal/GU ESRF and DialysisRenal disease     Musculoskeletal  (+) Arthritis , narcotic dependent Scoliosis    Abdominal   Peds  Hematology negative hematology ROS (+)   Anesthesia Other Findings   Reproductive/Obstetrics                          Anesthesia Physical Anesthesia Plan  ASA: III  Anesthesia Plan: General   Post-op Pain Management:    Induction: Intravenous and Rapid sequence  PONV Risk Score and Plan: 2 and Treatment may vary due to age or medical condition, Ondansetron, Midazolam and Scopolamine patch - Pre-op  Airway Management Planned: Oral ETT  Additional Equipment: None  Intra-op Plan:   Post-operative Plan: Extubation in OR  Informed Consent: I have  reviewed the patients History and Physical, chart, labs and discussed the procedure including the risks, benefits and alternatives for the proposed anesthesia with the patient or authorized representative who has indicated his/her understanding and acceptance.     Dental advisory given  Plan Discussed with: CRNA and Anesthesiologist  Anesthesia Plan Comments:       Anesthesia Quick Evaluation

## 2018-09-26 NOTE — Progress Notes (Signed)
SDW-pre-op call completed by py mother, Lattie Haw (DPR), with pt on speaker phone. PCP - Dr. Nemiah Commander, PCP Cardiologist - denies Endocrinologist- Dr. Dwyane Dee Chest x-ray - 1 view 06/28/18 EKG - 06/28/18 Stress Test - denies ECHO - 06/12/18 (Epic) Cardiac Cath - denies DPR made aware to have pt stop Buprenorphine HCl-Naloxone; DPR not sure if pt can go w/o several doses. DPR made aware to have pt stop taking vitamins, fish oil and herbal medications. Do not take any NSAIDs ie: Ibuprofen, Advil, Naproxen (Aleve), Motrin, BC and Goody Powder. Sleep Study - denies CPAP - n/a Type 1 diabetic- nurse made DPR aware to have pt take 16 units of Levemir insulin at HS and if CBG is > 70 on DOS take 16 units of Levemir insulin. If CBG is > 220 take 1/2 of Novolog insulin SS . DPR stated that pt was given insulin instructions by the surgeon to take only half .  DPR made aware to have pt check CBG every 2 hours prior to arrival to hospital on DOS. Pt made aware to treat a CBG < 70 with  4 ounces of apple or cranberry juice, wait 15 minutes after intervention to recheck BG, if BG remains < 70, call Short Stay unit to speak with a nurse. Fasting Blood Sugar -" 200-300's it depends "  Blood Thinner Instructions: n/a Aspirin Instructions: continue Anesthesia review: yes; see note  Coronavirus Screening  Have you experienced the following symptoms:  Cough yes/no: No Fever (>100.7F)  yes/no: No Runny nose yes/no: No Sore throat yes/no: No Difficulty breathing/shortness of breath  yes/no: No  Have you or a family member traveled in the last 14 days and where? yes/no: No  DPR reminded that hospital visitation restrictions are in effect and the importance of the restrictions.   DPR  verbalized understanding of all pre-op  Instructions.

## 2018-09-27 ENCOUNTER — Ambulatory Visit (HOSPITAL_COMMUNITY): Payer: Medicare Other | Admitting: Physician Assistant

## 2018-09-27 ENCOUNTER — Ambulatory Visit (HOSPITAL_COMMUNITY)
Admission: RE | Admit: 2018-09-27 | Discharge: 2018-09-27 | Disposition: A | Discharge status: Home / Self Care | Payer: Medicare Other | Source: Ambulatory Visit | Attending: Vascular Surgery | Admitting: Vascular Surgery

## 2018-09-27 ENCOUNTER — Encounter (HOSPITAL_COMMUNITY)
Admission: RE | Disposition: A | Discharge status: Home / Self Care | Payer: Self-pay | Source: Ambulatory Visit | Attending: Vascular Surgery

## 2018-09-27 DIAGNOSIS — Z992 Dependence on renal dialysis: Secondary | ICD-10-CM

## 2018-09-27 DIAGNOSIS — F329 Major depressive disorder, single episode, unspecified: Secondary | ICD-10-CM | POA: Insufficient documentation

## 2018-09-27 DIAGNOSIS — E1143 Type 2 diabetes mellitus with diabetic autonomic (poly)neuropathy: Secondary | ICD-10-CM | POA: Diagnosis not present

## 2018-09-27 DIAGNOSIS — E1122 Type 2 diabetes mellitus with diabetic chronic kidney disease: Secondary | ICD-10-CM | POA: Diagnosis not present

## 2018-09-27 DIAGNOSIS — Q231 Congenital insufficiency of aortic valve: Secondary | ICD-10-CM | POA: Insufficient documentation

## 2018-09-27 DIAGNOSIS — R011 Cardiac murmur, unspecified: Secondary | ICD-10-CM | POA: Insufficient documentation

## 2018-09-27 DIAGNOSIS — M549 Dorsalgia, unspecified: Secondary | ICD-10-CM | POA: Diagnosis not present

## 2018-09-27 DIAGNOSIS — K449 Diaphragmatic hernia without obstruction or gangrene: Secondary | ICD-10-CM | POA: Insufficient documentation

## 2018-09-27 DIAGNOSIS — N186 End stage renal disease: Secondary | ICD-10-CM

## 2018-09-27 DIAGNOSIS — Z79899 Other long term (current) drug therapy: Secondary | ICD-10-CM | POA: Insufficient documentation

## 2018-09-27 DIAGNOSIS — T82510A Breakdown (mechanical) of surgically created arteriovenous fistula, initial encounter: Secondary | ICD-10-CM | POA: Insufficient documentation

## 2018-09-27 DIAGNOSIS — Y832 Surgical operation with anastomosis, bypass or graft as the cause of abnormal reaction of the patient, or of later complication, without mention of misadventure at the time of the procedure: Secondary | ICD-10-CM | POA: Insufficient documentation

## 2018-09-27 DIAGNOSIS — K279 Peptic ulcer, site unspecified, unspecified as acute or chronic, without hemorrhage or perforation: Secondary | ICD-10-CM | POA: Diagnosis not present

## 2018-09-27 DIAGNOSIS — M419 Scoliosis, unspecified: Secondary | ICD-10-CM | POA: Diagnosis not present

## 2018-09-27 DIAGNOSIS — E7800 Pure hypercholesterolemia, unspecified: Secondary | ICD-10-CM | POA: Diagnosis not present

## 2018-09-27 DIAGNOSIS — F17210 Nicotine dependence, cigarettes, uncomplicated: Secondary | ICD-10-CM | POA: Diagnosis not present

## 2018-09-27 DIAGNOSIS — I120 Hypertensive chronic kidney disease with stage 5 chronic kidney disease or end stage renal disease: Secondary | ICD-10-CM | POA: Insufficient documentation

## 2018-09-27 DIAGNOSIS — M1990 Unspecified osteoarthritis, unspecified site: Secondary | ICD-10-CM | POA: Diagnosis not present

## 2018-09-27 DIAGNOSIS — F419 Anxiety disorder, unspecified: Secondary | ICD-10-CM | POA: Insufficient documentation

## 2018-09-27 DIAGNOSIS — K210 Gastro-esophageal reflux disease with esophagitis: Secondary | ICD-10-CM | POA: Diagnosis not present

## 2018-09-27 DIAGNOSIS — Z794 Long term (current) use of insulin: Secondary | ICD-10-CM | POA: Diagnosis not present

## 2018-09-27 DIAGNOSIS — K3184 Gastroparesis: Secondary | ICD-10-CM | POA: Insufficient documentation

## 2018-09-27 HISTORY — PX: BASCILIC VEIN TRANSPOSITION: SHX5742

## 2018-09-27 HISTORY — DX: End stage renal disease: N18.6

## 2018-09-27 HISTORY — DX: Unspecified convulsions: R56.9

## 2018-09-27 HISTORY — DX: Dependence on renal dialysis: Z99.2

## 2018-09-27 LAB — GLUCOSE, CAPILLARY
Glucose-Capillary: 363 mg/dL — ABNORMAL HIGH (ref 70–99)
Glucose-Capillary: 374 mg/dL — ABNORMAL HIGH (ref 70–99)
Glucose-Capillary: 341 mg/dL — ABNORMAL HIGH (ref 70–99)
Glucose-Capillary: 358 mg/dL — ABNORMAL HIGH (ref 70–99)

## 2018-09-27 SURGERY — TRANSPOSITION, VEIN, BASILIC
Anesthesia: General | Site: Arm Upper | Laterality: Left

## 2018-09-27 MED ORDER — KETAMINE HCL 50 MG/5ML IJ SOSY
PREFILLED_SYRINGE | INTRAMUSCULAR | Status: AC
  Filled 2018-09-27: qty 5

## 2018-09-27 MED ORDER — 0.9 % SODIUM CHLORIDE (POUR BTL) OPTIME
TOPICAL | Status: DC | PRN
  Administered 2018-09-27: 1000 mL

## 2018-09-27 MED ORDER — PHENYLEPHRINE 40 MCG/ML (10ML) SYRINGE FOR IV PUSH (FOR BLOOD PRESSURE SUPPORT)
PREFILLED_SYRINGE | INTRAVENOUS | Status: AC
  Filled 2018-09-27: qty 10

## 2018-09-27 MED ORDER — LIDOCAINE 2% (20 MG/ML) 5 ML SYRINGE
INTRAMUSCULAR | Status: DC | PRN
  Administered 2018-09-27: 80 mg via INTRAVENOUS

## 2018-09-27 MED ORDER — ONDANSETRON HCL 4 MG/2ML IJ SOLN
INTRAMUSCULAR | Status: AC
  Filled 2018-09-27: qty 2

## 2018-09-27 MED ORDER — PHENYLEPHRINE HCL (PRESSORS) 10 MG/ML IV SOLN
INTRAVENOUS | Status: AC
  Filled 2018-09-27: qty 1

## 2018-09-27 MED ORDER — LIDOCAINE-EPINEPHRINE (PF) 1 %-1:200000 IJ SOLN
INTRAMUSCULAR | Status: DC | PRN
  Administered 2018-09-27: 30 mL

## 2018-09-27 MED ORDER — PAPAVERINE HCL 30 MG/ML IJ SOLN
INTRAMUSCULAR | Status: AC
  Filled 2018-09-27: qty 2

## 2018-09-27 MED ORDER — MIDAZOLAM HCL 2 MG/2ML IJ SOLN
INTRAMUSCULAR | Status: AC
  Filled 2018-09-27: qty 2

## 2018-09-27 MED ORDER — THROMBIN (RECOMBINANT) 20000 UNITS EX SOLR
CUTANEOUS | Status: AC
  Filled 2018-09-27: qty 20000

## 2018-09-27 MED ORDER — CHLORHEXIDINE GLUCONATE 4 % EX LIQD: 60.0000 mL | Freq: Once | CUTANEOUS | Status: DC

## 2018-09-27 MED ORDER — INSULIN ASPART 100 UNIT/ML ~~LOC~~ SOLN
9.0000 [IU] | Freq: Once | SUBCUTANEOUS | Status: AC
  Administered 2018-09-27: 9 [IU] via SUBCUTANEOUS

## 2018-09-27 MED ORDER — PROTAMINE SULFATE 10 MG/ML IV SOLN
INTRAVENOUS | Status: DC | PRN
  Administered 2018-09-27: 40 mg via INTRAVENOUS
  Administered 2018-09-27: 10 mg via INTRAVENOUS

## 2018-09-27 MED ORDER — INSULIN ASPART 100 UNIT/ML ~~LOC~~ SOLN
10.0000 [IU] | Freq: Once | SUBCUTANEOUS | Status: AC
  Administered 2018-09-27: 10 [IU] via SUBCUTANEOUS

## 2018-09-27 MED ORDER — HEPARIN SODIUM (PORCINE) 1000 UNIT/ML IJ SOLN
INTRAMUSCULAR | Status: DC | PRN
  Administered 2018-09-27: 7000 [IU] via INTRAVENOUS

## 2018-09-27 MED ORDER — FENTANYL CITRATE (PF) 250 MCG/5ML IJ SOLN
INTRAMUSCULAR | Status: DC | PRN
  Administered 2018-09-27: 50 ug via INTRAVENOUS
  Administered 2018-09-27: 200 ug via INTRAVENOUS

## 2018-09-27 MED ORDER — LIDOCAINE 2% (20 MG/ML) 5 ML SYRINGE
INTRAMUSCULAR | Status: AC
  Filled 2018-09-27: qty 5

## 2018-09-27 MED ORDER — SODIUM CHLORIDE 0.9 % IV SOLN
INTRAVENOUS | Status: DC | PRN
  Administered 2018-09-27: 20 ug/min via INTRAVENOUS

## 2018-09-27 MED ORDER — HEPARIN SODIUM (PORCINE) 1000 UNIT/ML IJ SOLN
INTRAMUSCULAR | Status: AC
  Filled 2018-09-27: qty 2

## 2018-09-27 MED ORDER — INSULIN ASPART 100 UNIT/ML ~~LOC~~ SOLN
SUBCUTANEOUS | Status: AC
  Filled 2018-09-27: qty 1

## 2018-09-27 MED ORDER — PROPOFOL 10 MG/ML IV BOLUS
INTRAVENOUS | Status: DC | PRN
  Administered 2018-09-27: 200 mg via INTRAVENOUS

## 2018-09-27 MED ORDER — PAPAVERINE HCL 30 MG/ML IJ SOLN
INTRAMUSCULAR | Status: DC | PRN
  Administered 2018-09-27: 60 mg

## 2018-09-27 MED ORDER — LIDOCAINE HCL (PF) 1 % IJ SOLN
INTRAMUSCULAR | Status: AC
  Filled 2018-09-27: qty 30

## 2018-09-27 MED ORDER — PROPOFOL 10 MG/ML IV BOLUS
INTRAVENOUS | Status: AC
  Filled 2018-09-27: qty 20

## 2018-09-27 MED ORDER — EPHEDRINE SULFATE 50 MG/ML IJ SOLN
INTRAMUSCULAR | Status: DC | PRN
  Administered 2018-09-27: 15 mg via INTRAVENOUS
  Administered 2018-09-27 (×3): 10 mg via INTRAVENOUS

## 2018-09-27 MED ORDER — ONDANSETRON HCL 4 MG/2ML IJ SOLN
INTRAMUSCULAR | Status: DC | PRN
  Administered 2018-09-27: 4 mg via INTRAVENOUS

## 2018-09-27 MED ORDER — SUCCINYLCHOLINE CHLORIDE 200 MG/10ML IV SOSY
PREFILLED_SYRINGE | INTRAVENOUS | Status: AC
  Filled 2018-09-27: qty 10

## 2018-09-27 MED ORDER — FENTANYL CITRATE (PF) 100 MCG/2ML IJ SOLN: 25.0000 ug | INTRAMUSCULAR | Status: DC | PRN

## 2018-09-27 MED ORDER — PROMETHAZINE HCL 25 MG/ML IJ SOLN: 6.2500 mg | INTRAMUSCULAR | Status: DC | PRN

## 2018-09-27 MED ORDER — CEFAZOLIN SODIUM-DEXTROSE 2-4 GM/100ML-% IV SOLN
2.0000 g | INTRAVENOUS | Status: AC
  Administered 2018-09-27: 12:00:00 2 g via INTRAVENOUS

## 2018-09-27 MED ORDER — CEFAZOLIN SODIUM 1 G IJ SOLR
INTRAMUSCULAR | Status: AC
  Filled 2018-09-27: qty 20

## 2018-09-27 MED ORDER — SCOPOLAMINE 1 MG/3DAYS TD PT72
MEDICATED_PATCH | TRANSDERMAL | Status: AC
  Filled 2018-09-27: qty 1

## 2018-09-27 MED ORDER — MIDAZOLAM HCL 2 MG/2ML IJ SOLN
INTRAMUSCULAR | Status: DC | PRN
  Administered 2018-09-27: 2 mg via INTRAVENOUS

## 2018-09-27 MED ORDER — SODIUM CHLORIDE 0.9 % IV SOLN
INTRAVENOUS | Status: DC | PRN
  Administered 2018-09-27: 500 mL

## 2018-09-27 MED ORDER — SODIUM CHLORIDE 0.9 % IV SOLN
INTRAVENOUS | Status: DC
  Administered 2018-09-27 (×2): via INTRAVENOUS

## 2018-09-27 MED ORDER — INSULIN ASPART 100 UNIT/ML ~~LOC~~ SOLN
SUBCUTANEOUS | Status: AC
  Administered 2018-09-27: 10 [IU] via SUBCUTANEOUS
  Filled 2018-09-27: qty 1

## 2018-09-27 MED ORDER — KETAMINE HCL 10 MG/ML IJ SOLN
INTRAMUSCULAR | Status: DC | PRN
  Administered 2018-09-27: 50 mg via INTRAVENOUS

## 2018-09-27 MED ORDER — SODIUM CHLORIDE 0.9 % IV SOLN
INTRAVENOUS | Status: AC
  Filled 2018-09-27: qty 1.2

## 2018-09-27 MED ORDER — SUCCINYLCHOLINE CHLORIDE 200 MG/10ML IV SOSY
PREFILLED_SYRINGE | INTRAVENOUS | Status: DC | PRN
  Administered 2018-09-27: 120 mg via INTRAVENOUS

## 2018-09-27 MED ORDER — FENTANYL CITRATE (PF) 250 MCG/5ML IJ SOLN
INTRAMUSCULAR | Status: AC
  Filled 2018-09-27: qty 5

## 2018-09-27 MED ORDER — PHENYLEPHRINE 40 MCG/ML (10ML) SYRINGE FOR IV PUSH (FOR BLOOD PRESSURE SUPPORT)
PREFILLED_SYRINGE | INTRAVENOUS | Status: DC | PRN
  Administered 2018-09-27: 40 ug via INTRAVENOUS
  Administered 2018-09-27 (×2): 80 ug via INTRAVENOUS
  Administered 2018-09-27: 120 ug via INTRAVENOUS
  Administered 2018-09-27: 80 ug via INTRAVENOUS

## 2018-09-27 MED ORDER — LIDOCAINE-EPINEPHRINE (PF) 1 %-1:200000 IJ SOLN
INTRAMUSCULAR | Status: AC
  Filled 2018-09-27: qty 30

## 2018-09-27 MED ORDER — INSULIN ASPART 100 UNIT/ML ~~LOC~~ SOLN
5.0000 [IU] | Freq: Once | SUBCUTANEOUS | Status: AC
  Administered 2018-09-27: 5 [IU] via SUBCUTANEOUS

## 2018-09-27 MED ORDER — PROTAMINE SULFATE 10 MG/ML IV SOLN
INTRAVENOUS | Status: AC
  Filled 2018-09-27: qty 10

## 2018-09-27 SURGICAL SUPPLY — 37 items
ADH SKN CLS APL DERMABOND .7 (GAUZE/BANDAGES/DRESSINGS) ×1
ARMBAND PINK RESTRICT EXTREMIT (MISCELLANEOUS) ×3 IMPLANT
CANISTER SUCT 3000ML PPV (MISCELLANEOUS) ×3 IMPLANT
CANNULA VESSEL 3MM 2 BLNT TIP (CANNULA) ×5 IMPLANT
CLIP VESOCCLUDE MED 24/CT (CLIP) ×3 IMPLANT
CLIP VESOCCLUDE SM WIDE 24/CT (CLIP) ×3 IMPLANT
COVER PROBE W GEL 5X96 (DRAPES) IMPLANT
COVER WAND RF STERILE (DRAPES) ×1 IMPLANT
DECANTER SPIKE VIAL GLASS SM (MISCELLANEOUS) ×5 IMPLANT
DERMABOND ADVANCED (GAUZE/BANDAGES/DRESSINGS) ×2
DERMABOND ADVANCED .7 DNX12 (GAUZE/BANDAGES/DRESSINGS) ×1 IMPLANT
ELECT REM PT RETURN 9FT ADLT (ELECTROSURGICAL) ×3
ELECTRODE REM PT RTRN 9FT ADLT (ELECTROSURGICAL) ×1 IMPLANT
GLOVE BIO SURGEON STRL SZ7.5 (GLOVE) ×3 IMPLANT
GLOVE BIOGEL PI IND STRL 8 (GLOVE) ×1 IMPLANT
GLOVE BIOGEL PI INDICATOR 8 (GLOVE) ×2
GLOVE SURG SS PI 7.0 STRL IVOR (GLOVE) ×2 IMPLANT
GOWN STRL REUS W/ TWL LRG LVL3 (GOWN DISPOSABLE) ×3 IMPLANT
GOWN STRL REUS W/TWL LRG LVL3 (GOWN DISPOSABLE) ×9
KIT BASIN OR (CUSTOM PROCEDURE TRAY) ×3 IMPLANT
KIT TURNOVER KIT B (KITS) ×3 IMPLANT
NS IRRIG 1000ML POUR BTL (IV SOLUTION) ×3 IMPLANT
PACK CV ACCESS (CUSTOM PROCEDURE TRAY) ×3 IMPLANT
PAD ARMBOARD 7.5X6 YLW CONV (MISCELLANEOUS) ×6 IMPLANT
SPONGE SURGIFOAM ABS GEL 100 (HEMOSTASIS) IMPLANT
SUT PROLENE 6 0 BV (SUTURE) ×6 IMPLANT
SUT SILK 2 0 SH (SUTURE) IMPLANT
SUT SILK 3 0 (SUTURE) ×3
SUT SILK 3-0 18XBRD TIE 12 (SUTURE) IMPLANT
SUT VIC AB 3-0 SH 27 (SUTURE) ×6
SUT VIC AB 3-0 SH 27X BRD (SUTURE) ×2 IMPLANT
SUT VICRYL 4-0 PS2 18IN ABS (SUTURE) ×6 IMPLANT
SYR 20ML ECCENTRIC (SYRINGE) ×2 IMPLANT
SYRINGE 3CC LL L/F (MISCELLANEOUS) ×2 IMPLANT
TOWEL GREEN STERILE (TOWEL DISPOSABLE) ×3 IMPLANT
UNDERPAD 30X30 (UNDERPADS AND DIAPERS) ×3 IMPLANT
WATER STERILE IRR 1000ML POUR (IV SOLUTION) ×3 IMPLANT

## 2018-09-27 NOTE — Progress Notes (Signed)
Pt received total of 24 units of Novolog in PACU. Final blood sugar 341. Patient requesting to leave hospital to go home against medical advice. Dr. Fransisco Beau evaluated patient and discussed his concerns for discharge but patient requested to still go home. Dr. Fransisco Beau approved discharge after informing patient that this is against medical advice.

## 2018-09-27 NOTE — Interval H&P Note (Signed)
History and Physical Interval Note:  09/27/2018 11:11 AM  [address]  has presented today for surgery, with the diagnosis of end stage renal disease.  The various methods of treatment have been discussed with the patient and family. After consideration of risks, benefits and other options for treatment, the patient has consented to  Procedure(s): BASILIC VEIN TRANSPOSITION VERSUS GRAFT PLACEMENT (Left) as a surgical intervention.  The patient's history has been reviewed, patient examined, no change in status, stable for surgery.  I have reviewed the patient's chart and labs.  Questions were answered to the patient's satisfaction.     Deitra Mayo

## 2018-09-27 NOTE — Discharge Instructions (Signed)
° °  [facility] [place] ° °Discharge Instructions ° °AV Fistula or Graft Surgery for Dialysis Access ° °Please refer to the following instructions for your post-procedure care. Your surgeon or physician assistant will discuss any changes with you. ° °Activity ° °You may drive the day following your surgery, if you are comfortable and no longer taking prescription pain medication. Resume full activity as the soreness in your incision resolves. ° °Bathing/Showering ° °You may shower after you go home. Keep your incision dry for 48 hours. Do not soak in a bathtub, hot tub, or swim until the incision heals completely. You may not shower if you have a hemodialysis catheter. ° °Incision Care ° °Clean your incision with mild soap and water after 48 hours. Pat the area dry with a clean towel. You do not need a bandage unless otherwise instructed. Do not apply any ointments or creams to your incision. You may have skin glue on your incision. Do not peel it off. It will come off on its own in about one week. Your arm may swell a bit after surgery. To reduce swelling use pillows to elevate your arm so it is above your heart. Your doctor will tell you if you need to lightly wrap your arm with an ACE bandage. ° °Diet ° °Resume your normal diet. There are not special food restrictions following this procedure. In order to heal from your surgery, it is CRITICAL to get adequate nutrition. Your body requires vitamins, minerals, and protein. Vegetables are the best source of vitamins and minerals. Vegetables also provide the perfect balance of protein. Processed food has little nutritional value, so try to avoid this. ° °Medications ° °Resume taking all of your medications. If your incision is causing pain, you may take over-the counter pain relievers such as acetaminophen (Tylenol). If you were prescribed a stronger pain medication, please be aware these medications can cause nausea and constipation. Prevent  nausea by taking the medication with a snack or meal. Avoid constipation by drinking plenty of fluids and eating foods with high amount of fiber, such as fruits, vegetables, and grains. Do not take Tylenol if you are taking prescription pain medications. ° ° ° ° °Follow up °Your surgeon may want to see you in the office following your access surgery. If so, this will be arranged at the time of your surgery. ° °Please call us immediately for any of the following conditions: ° °Increased pain, redness, drainage (pus) from your incision site °Fever of 101 degrees or higher °Severe or worsening pain at your incision site °Hand pain or numbness. ° °Reduce your risk of vascular disease: ° °Stop smoking. If you would like help, call [facility] at 1-800-QUIT-NOW (1-800-784-8669) or [facility] at [phone] ° °Manage your cholesterol °Maintain a desired weight °Control your diabetes °Keep your blood pressure down ° °Dialysis ° °It will take several weeks to several months for your new dialysis access to be ready for use. Your surgeon will determine when it is OK to use it. Your nephrologist will continue to direct your dialysis. You can continue to use your Permcath until your new access is ready for use. ° °If you have any questions, please call the office at [phone]. ° °

## 2018-09-27 NOTE — Anesthesia Procedure Notes (Signed)
Procedure Name: Intubation Date/Time: 09/27/2018 11:45 AM Performed by: Leonor Liv, CRNA Pre-anesthesia Checklist: Patient identified, Emergency Drugs available, Suction available and Patient being monitored Patient Re-evaluated:Patient Re-evaluated prior to induction Oxygen Delivery Method: Circle System Utilized Preoxygenation: Pre-oxygenation with 100% oxygen Induction Type: IV induction, Rapid sequence and Cricoid Pressure applied Laryngoscope Size: Mac and 4 Grade View: Grade I Tube type: Oral Number of attempts: 1 Airway Equipment and Method: Stylet and Oral airway Placement Confirmation: ETT inserted through vocal cords under direct vision,  positive ETCO2 and breath sounds checked- equal and bilateral Secured at: 23 cm Tube secured with: Tape Dental Injury: Teeth and Oropharynx as per pre-operative assessment

## 2018-09-27 NOTE — Transfer of Care (Signed)
Immediate Anesthesia Transfer of Care Note  Patient: Brent Daniels  Procedure(s) Performed: BASILIC VEIN TRANSPOSITION Left Arm (Left Arm Upper)  Patient Location: PACU  Anesthesia Type:General  Level of Consciousness: awake and patient cooperative  Airway & Oxygen Therapy: Patient Spontanous Breathing and Patient connected to nasal cannula oxygen  Post-op Assessment: Report given to RN, Post -op Vital signs reviewed and stable and Patient moving all extremities  Post vital signs: Reviewed and stable  Last Vitals:  Vitals Value Taken Time  BP 173/96 09/27/18 1421  Temp    Pulse 95 09/27/18 1423  Resp 15 09/27/18 1423  SpO2 100 % 09/27/18 1423  Vitals shown include unvalidated device data.  Last Pain:  Vitals:   09/27/18 1044  TempSrc: Oral         Complications: No apparent anesthesia complications

## 2018-09-27 NOTE — Op Note (Signed)
    NAME: Brent Daniels    MRN: [id number] DOB: [birth year]    DATE OF OPERATION: 09/27/2018  PREOP DIAGNOSIS:    End-stage renal disease  POSTOP DIAGNOSIS:    Same  PROCEDURE:    Left basilic vein transposition  SURGEON: Judeth Cornfield. Scot Dock, MD, FACS  ASSIST: Arlee Muslim, PA  ANESTHESIA: General  EBL: Minimal  INDICATIONS:    Brent Daniels is a 33 y.o. male who presents for new access.  He dialyzes using a catheter.  It looks like his best option for a fistula was a left basilic vein transposition.   FINDINGS:   4 mm basilic vein palpable radial pulse at the completion of the procedure.  TECHNIQUE:   The patient was taken to the operating room and received a general anesthetic.  The left arm was prepped and draped in usual sterile fashion.  Longitudinal incision was made over the basilic vein just above the antecubital level.  Here the vein was dissected free.  Branches were divided between clips and 3-0 silk ties.  Using 3 additional incisions along the medial aspect of the left arm the basilic vein was harvested up to the axilla where it entered the brachial system.  Through the distal incision the brachial artery was exposed and controlled with a vessel loop.  The vein was ligated distally and irrigated up nicely with heparinized saline.  It was marked to prevent twisting.  A tunnel was created between the distal incision in the axillary incision and the vein was brought through the tunnel.  The patient was heparinized.  The brachial artery was clamped proximally and distally and a longitudinal arteriotomy was made.  The vein was spatulated and sewn end-to-side to the artery using continuous 6-0 Prolene suture.  At the completion was an excellent thrill in the fistula.  There was a palpable radial pulse.  The heparin was partially reversed with protamine.  Hemostasis was obtained the wounds.  Each of the wounds was closed with a deep layer of 3-0 Vicryl and the skin closed  with 4-0 Vicryl.  Dermabond was applied.  The patient tolerated the procedure well and was transferred to the recovery room in stable condition.  All needle and sponge counts were correct.  Deitra Mayo, MD, FACS [facility]  DATE OF DICTATION:   09/27/2018

## 2018-09-29 NOTE — Anesthesia Postprocedure Evaluation (Signed)
Anesthesia Post Note  Patient: Brent Daniels  Procedure(s) Performed: BASILIC VEIN TRANSPOSITION Left Arm (Left Arm Upper)     Patient location during evaluation: PACU Anesthesia Type: General Level of consciousness: awake and alert Pain management: pain level controlled Vital Signs Assessment: post-procedure vital signs reviewed and stable Respiratory status: spontaneous breathing, nonlabored ventilation and respiratory function stable Cardiovascular status: blood pressure returned to baseline and stable Postop Assessment: no apparent nausea or vomiting Anesthetic complications: no Comments: Patient with consistently elevated BS in PACU despite multiple doses of subcutaneous novolog. Patient agitated, requesting to leave. Patient advised against leaving due to persistently elevated BS > 300. Patient otherwise doing well, recovered from anesthesia. After lengthy discussion, patient opted to leave against medical advice. Patient instructed to return to ED if BS unable to be brought down to safer level at home.    Last Vitals:  Vitals:   09/27/18 1615 09/27/18 1621  BP:  135/90  Pulse: 94 89  Resp: 13 12  Temp:  (!) 36.3 C  SpO2: 98% 100%    Last Pain:  Vitals:   09/27/18 1621  TempSrc:   PainSc: 0-No pain                 Audry Pili

## 2018-09-30 ENCOUNTER — Encounter (HOSPITAL_COMMUNITY): Payer: Self-pay | Admitting: Vascular Surgery

## 2018-09-30 LAB — POCT I-STAT 4, (NA,K, GLUC, HGB,HCT)
Glucose, Bld: 187 mg/dL — ABNORMAL HIGH (ref 70–99)
HCT: 34 % — ABNORMAL LOW (ref 39.0–52.0)
Hemoglobin: 11.6 g/dL — ABNORMAL LOW (ref 13.0–17.0)
Potassium: 5.5 mmol/L — ABNORMAL HIGH (ref 3.5–5.1)
Sodium: 133 mmol/L — ABNORMAL LOW (ref 135–145)

## 2018-11-07 ENCOUNTER — Other Ambulatory Visit: Payer: Self-pay

## 2018-11-07 DIAGNOSIS — Z992 Dependence on renal dialysis: Secondary | ICD-10-CM

## 2018-11-07 DIAGNOSIS — N186 End stage renal disease: Secondary | ICD-10-CM

## 2018-11-14 ENCOUNTER — Telehealth (HOSPITAL_COMMUNITY): Payer: Self-pay | Admitting: *Deleted

## 2018-11-15 ENCOUNTER — Encounter (HOSPITAL_COMMUNITY): Payer: Medicare Other

## 2018-11-25 ENCOUNTER — Encounter (HOSPITAL_COMMUNITY): Payer: Self-pay | Admitting: Emergency Medicine

## 2018-11-25 ENCOUNTER — Other Ambulatory Visit: Payer: Self-pay

## 2018-11-25 ENCOUNTER — Emergency Department (HOSPITAL_COMMUNITY): Payer: Medicare Other

## 2018-11-25 ENCOUNTER — Inpatient Hospital Stay (HOSPITAL_COMMUNITY)
Admission: EM | Admit: 2018-11-25 | Discharge: 2018-11-28 | DRG: 637 | Disposition: A | Discharge status: Home / Self Care | Payer: Medicare Other | Attending: Internal Medicine | Admitting: Internal Medicine

## 2018-11-25 DIAGNOSIS — Z833 Family history of diabetes mellitus: Secondary | ICD-10-CM

## 2018-11-25 DIAGNOSIS — I509 Heart failure, unspecified: Secondary | ICD-10-CM | POA: Diagnosis present

## 2018-11-25 DIAGNOSIS — D649 Anemia, unspecified: Secondary | ICD-10-CM | POA: Diagnosis present

## 2018-11-25 DIAGNOSIS — F17210 Nicotine dependence, cigarettes, uncomplicated: Secondary | ICD-10-CM | POA: Diagnosis present

## 2018-11-25 DIAGNOSIS — K3184 Gastroparesis: Secondary | ICD-10-CM | POA: Diagnosis present

## 2018-11-25 DIAGNOSIS — G8929 Other chronic pain: Secondary | ICD-10-CM | POA: Diagnosis present

## 2018-11-25 DIAGNOSIS — Z20828 Contact with and (suspected) exposure to other viral communicable diseases: Secondary | ICD-10-CM | POA: Diagnosis present

## 2018-11-25 DIAGNOSIS — N186 End stage renal disease: Secondary | ICD-10-CM | POA: Diagnosis present

## 2018-11-25 DIAGNOSIS — G9341 Metabolic encephalopathy: Secondary | ICD-10-CM | POA: Diagnosis present

## 2018-11-25 DIAGNOSIS — I132 Hypertensive heart and chronic kidney disease with heart failure and with stage 5 chronic kidney disease, or end stage renal disease: Secondary | ICD-10-CM | POA: Diagnosis present

## 2018-11-25 DIAGNOSIS — N2581 Secondary hyperparathyroidism of renal origin: Secondary | ICD-10-CM | POA: Diagnosis present

## 2018-11-25 DIAGNOSIS — Z794 Long term (current) use of insulin: Secondary | ICD-10-CM

## 2018-11-25 DIAGNOSIS — E1022 Type 1 diabetes mellitus with diabetic chronic kidney disease: Secondary | ICD-10-CM | POA: Diagnosis present

## 2018-11-25 DIAGNOSIS — E878 Other disorders of electrolyte and fluid balance, not elsewhere classified: Secondary | ICD-10-CM | POA: Diagnosis present

## 2018-11-25 DIAGNOSIS — E871 Hypo-osmolality and hyponatremia: Secondary | ICD-10-CM | POA: Diagnosis present

## 2018-11-25 DIAGNOSIS — R739 Hyperglycemia, unspecified: Secondary | ICD-10-CM | POA: Diagnosis present

## 2018-11-25 DIAGNOSIS — I4581 Long QT syndrome: Secondary | ICD-10-CM | POA: Diagnosis present

## 2018-11-25 DIAGNOSIS — E860 Dehydration: Secondary | ICD-10-CM | POA: Diagnosis present

## 2018-11-25 DIAGNOSIS — Z8719 Personal history of other diseases of the digestive system: Secondary | ICD-10-CM

## 2018-11-25 DIAGNOSIS — Z885 Allergy status to narcotic agent status: Secondary | ICD-10-CM

## 2018-11-25 DIAGNOSIS — Z886 Allergy status to analgesic agent status: Secondary | ICD-10-CM

## 2018-11-25 DIAGNOSIS — Z992 Dependence on renal dialysis: Secondary | ICD-10-CM

## 2018-11-25 DIAGNOSIS — I083 Combined rheumatic disorders of mitral, aortic and tricuspid valves: Secondary | ICD-10-CM | POA: Diagnosis present

## 2018-11-25 DIAGNOSIS — F329 Major depressive disorder, single episode, unspecified: Secondary | ICD-10-CM | POA: Diagnosis present

## 2018-11-25 DIAGNOSIS — M545 Low back pain: Secondary | ICD-10-CM | POA: Diagnosis present

## 2018-11-25 DIAGNOSIS — E1110 Type 2 diabetes mellitus with ketoacidosis without coma: Secondary | ICD-10-CM | POA: Diagnosis present

## 2018-11-25 DIAGNOSIS — K219 Gastro-esophageal reflux disease without esophagitis: Secondary | ICD-10-CM | POA: Diagnosis present

## 2018-11-25 DIAGNOSIS — Z9115 Patient's noncompliance with renal dialysis: Secondary | ICD-10-CM

## 2018-11-25 DIAGNOSIS — E1043 Type 1 diabetes mellitus with diabetic autonomic (poly)neuropathy: Secondary | ICD-10-CM | POA: Diagnosis present

## 2018-11-25 DIAGNOSIS — E1310 Other specified diabetes mellitus with ketoacidosis without coma: Secondary | ICD-10-CM

## 2018-11-25 DIAGNOSIS — E1010 Type 1 diabetes mellitus with ketoacidosis without coma: Principal | ICD-10-CM

## 2018-11-25 DIAGNOSIS — Z9114 Patient's other noncompliance with medication regimen: Secondary | ICD-10-CM

## 2018-11-25 DIAGNOSIS — Z882 Allergy status to sulfonamides status: Secondary | ICD-10-CM

## 2018-11-25 DIAGNOSIS — Z9049 Acquired absence of other specified parts of digestive tract: Secondary | ICD-10-CM

## 2018-11-25 DIAGNOSIS — I5033 Acute on chronic diastolic (congestive) heart failure: Secondary | ICD-10-CM | POA: Diagnosis present

## 2018-11-25 DIAGNOSIS — E876 Hypokalemia: Secondary | ICD-10-CM | POA: Diagnosis present

## 2018-11-25 DIAGNOSIS — F1410 Cocaine abuse, uncomplicated: Secondary | ICD-10-CM | POA: Diagnosis present

## 2018-11-25 DIAGNOSIS — N19 Unspecified kidney failure: Secondary | ICD-10-CM

## 2018-11-25 DIAGNOSIS — Z8711 Personal history of peptic ulcer disease: Secondary | ICD-10-CM

## 2018-11-25 DIAGNOSIS — Z79899 Other long term (current) drug therapy: Secondary | ICD-10-CM

## 2018-11-25 LAB — CBC WITH DIFFERENTIAL/PLATELET
Abs Immature Granulocytes: 0.08 10*3/uL — ABNORMAL HIGH (ref 0.00–0.07)
Basophils Absolute: 0.1 10*3/uL (ref 0.0–0.1)
Basophils Relative: 0 %
Eosinophils Absolute: 0 10*3/uL (ref 0.0–0.5)
Eosinophils Relative: 0 %
HCT: 38.1 % — ABNORMAL LOW (ref 39.0–52.0)
Hemoglobin: 12.7 g/dL — ABNORMAL LOW (ref 13.0–17.0)
Immature Granulocytes: 0 %
Lymphocytes Relative: 11 %
Lymphs Abs: 2.1 10*3/uL (ref 0.7–4.0)
MCH: 30.7 pg (ref 26.0–34.0)
MCHC: 33.3 g/dL (ref 30.0–36.0)
MCV: 92 fL (ref 80.0–100.0)
Monocytes Absolute: 1.2 10*3/uL — ABNORMAL HIGH (ref 0.1–1.0)
Monocytes Relative: 6 %
Neutro Abs: 15.7 10*3/uL — ABNORMAL HIGH (ref 1.7–7.7)
Neutrophils Relative %: 83 %
Platelets: 374 10*3/uL (ref 150–400)
RBC: 4.14 MIL/uL — ABNORMAL LOW (ref 4.22–5.81)
RDW: 14.5 % (ref 11.5–15.5)
WBC: 19.1 10*3/uL — ABNORMAL HIGH (ref 4.0–10.5)
nRBC: 0 % (ref 0.0–0.2)

## 2018-11-25 LAB — COMPREHENSIVE METABOLIC PANEL
ALT: 16 U/L (ref 0–44)
AST: 17 U/L (ref 15–41)
Albumin: 4.4 g/dL (ref 3.5–5.0)
Alkaline Phosphatase: 120 U/L (ref 38–126)
Anion gap: >20 — ABNORMAL HIGH (ref 5–15)
BUN: 120 mg/dL — ABNORMAL HIGH (ref 6–20)
CO2: 20 mmol/L — ABNORMAL LOW (ref 22–32)
Calcium: 7.7 mg/dL — ABNORMAL LOW (ref 8.9–10.3)
Chloride: 59 mmol/L — CL (ref 98–111)
Creatinine, Ser: 13.84 mg/dL — ABNORMAL HIGH (ref 0.61–1.24)
GFR calc Af Amer: 5 mL/min — ABNORMAL LOW (ref >60)
GFR calc non Af Amer: 4 mL/min — ABNORMAL LOW (ref >60)
Glucose, Bld: 518 mg/dL (ref 70–99)
Potassium: 3.2 mmol/L — ABNORMAL LOW (ref 3.5–5.1)
Sodium: 122 mmol/L — ABNORMAL LOW (ref 135–145)
Total Bilirubin: 2 mg/dL — ABNORMAL HIGH (ref 0.3–1.2)
Total Protein: 8 g/dL (ref 6.5–8.1)

## 2018-11-25 LAB — BASIC METABOLIC PANEL
Anion gap: >20 — ABNORMAL HIGH (ref 5–15)
BUN: 94 mg/dL — ABNORMAL HIGH (ref 6–20)
CO2: 24 mmol/L (ref 22–32)
Calcium: 7.2 mg/dL — ABNORMAL LOW (ref 8.9–10.3)
Chloride: 65 mmol/L — ABNORMAL LOW (ref 98–111)
Creatinine, Ser: 14.37 mg/dL — ABNORMAL HIGH (ref 0.61–1.24)
GFR calc Af Amer: 4 mL/min — ABNORMAL LOW (ref >60)
GFR calc non Af Amer: 4 mL/min — ABNORMAL LOW (ref >60)
Glucose, Bld: 169 mg/dL — ABNORMAL HIGH (ref 70–99)
Potassium: 3.1 mmol/L — ABNORMAL LOW (ref 3.5–5.1)
Sodium: 125 mmol/L — ABNORMAL LOW (ref 135–145)

## 2018-11-25 LAB — CBG MONITORING, ED
Glucose-Capillary: 166 mg/dL — ABNORMAL HIGH (ref 70–99)
Glucose-Capillary: 173 mg/dL — ABNORMAL HIGH (ref 70–99)
Glucose-Capillary: 174 mg/dL — ABNORMAL HIGH (ref 70–99)
Glucose-Capillary: 187 mg/dL — ABNORMAL HIGH (ref 70–99)
Glucose-Capillary: 190 mg/dL — ABNORMAL HIGH (ref 70–99)
Glucose-Capillary: 207 mg/dL — ABNORMAL HIGH (ref 70–99)
Glucose-Capillary: 265 mg/dL — ABNORMAL HIGH (ref 70–99)
Glucose-Capillary: 344 mg/dL — ABNORMAL HIGH (ref 70–99)
Glucose-Capillary: >600 mg/dL (ref 70–99)

## 2018-11-25 LAB — BLOOD GAS, VENOUS
Acid-base deficit: 2 mmol/L (ref 0.0–2.0)
Bicarbonate: 23 mmol/L (ref 20.0–28.0)
FIO2: 21
O2 Saturation: 98.5 %
Patient temperature: 36.7
pCO2, Ven: 36.2 mmHg — ABNORMAL LOW (ref 44.0–60.0)
pH, Ven: 7.402 (ref 7.250–7.430)
pO2, Ven: 139 mmHg — ABNORMAL HIGH (ref 32.0–45.0)

## 2018-11-25 LAB — LIPASE, BLOOD: Lipase: 17 U/L (ref 11–51)

## 2018-11-25 LAB — MAGNESIUM: Magnesium: 2.8 mg/dL — ABNORMAL HIGH (ref 1.7–2.4)

## 2018-11-25 LAB — SARS CORONAVIRUS 2 BY RT PCR (HOSPITAL ORDER, PERFORMED IN ~~LOC~~ HOSPITAL LAB): SARS Coronavirus 2: NEGATIVE

## 2018-11-25 MED ORDER — PANTOPRAZOLE SODIUM 40 MG IV SOLR
40.0000 mg | INTRAVENOUS | Status: DC
  Administered 2018-11-26 – 2018-11-27 (×2): 40 mg via INTRAVENOUS
  Filled 2018-11-25 (×2): qty 40

## 2018-11-25 MED ORDER — POTASSIUM CHLORIDE 10 MEQ/100ML IV SOLN
INTRAVENOUS | Status: AC
  Filled 2018-11-25: qty 100

## 2018-11-25 MED ORDER — ONDANSETRON HCL 4 MG/2ML IJ SOLN
4.0000 mg | Freq: Once | INTRAMUSCULAR | Status: DC
  Filled 2018-11-25: qty 2

## 2018-11-25 MED ORDER — SODIUM CHLORIDE 0.9 % IV SOLN: 100.0000 mL | INTRAVENOUS | Status: DC | PRN

## 2018-11-25 MED ORDER — FENTANYL CITRATE (PF) 100 MCG/2ML IJ SOLN
50.0000 ug | Freq: Once | INTRAMUSCULAR | Status: AC
  Administered 2018-11-25: 50 ug via INTRAVENOUS
  Filled 2018-11-25: qty 2

## 2018-11-25 MED ORDER — DEXTROSE-NACL 5-0.45 % IV SOLN
INTRAVENOUS | Status: DC
  Administered 2018-11-25: 19:00:00 via INTRAVENOUS

## 2018-11-25 MED ORDER — LIDOCAINE-PRILOCAINE 2.5-2.5 % EX CREA: 1.0000 "application " | TOPICAL_CREAM | CUTANEOUS | Status: DC | PRN

## 2018-11-25 MED ORDER — DEXTROSE-NACL 5-0.9 % IV SOLN: INTRAVENOUS | Status: DC

## 2018-11-25 MED ORDER — HEPARIN SODIUM (PORCINE) 5000 UNIT/ML IJ SOLN
5000.0000 [IU] | Freq: Three times a day (TID) | INTRAMUSCULAR | Status: DC
  Filled 2018-11-25: qty 1

## 2018-11-25 MED ORDER — INSULIN REGULAR(HUMAN) IN NACL 100-0.9 UT/100ML-% IV SOLN
INTRAVENOUS | Status: DC
  Administered 2018-11-25: 5.4 [IU]/h via INTRAVENOUS
  Filled 2018-11-25: qty 100

## 2018-11-25 MED ORDER — CHLORHEXIDINE GLUCONATE CLOTH 2 % EX PADS: 6.0000 | MEDICATED_PAD | Freq: Every day | CUTANEOUS | Status: DC

## 2018-11-25 MED ORDER — LIDOCAINE HCL (PF) 1 % IJ SOLN: 5.0000 mL | INTRAMUSCULAR | Status: DC | PRN

## 2018-11-25 MED ORDER — POTASSIUM CHLORIDE 10 MEQ/100ML IV SOLN
10.0000 meq | Freq: Once | INTRAVENOUS | Status: AC
  Administered 2018-11-25: 10 meq via INTRAVENOUS

## 2018-11-25 MED ORDER — ONDANSETRON HCL 4 MG/2ML IJ SOLN
4.0000 mg | Freq: Once | INTRAMUSCULAR | Status: AC
  Administered 2018-11-25: 4 mg via INTRAVENOUS
  Filled 2018-11-25: qty 2

## 2018-11-25 MED ORDER — PENTAFLUOROPROP-TETRAFLUOROETH EX AERO: 1.0000 "application " | INHALATION_SPRAY | CUTANEOUS | Status: DC | PRN

## 2018-11-25 MED ORDER — DEXTROSE-NACL 5-0.45 % IV SOLN
INTRAVENOUS | Status: DC
  Administered 2018-11-25: 21:00:00 via INTRAVENOUS

## 2018-11-25 MED ORDER — POTASSIUM CHLORIDE 10 MEQ/100ML IV SOLN
10.0000 meq | INTRAVENOUS | Status: DC
  Administered 2018-11-25 (×3): 10 meq via INTRAVENOUS
  Filled 2018-11-25 (×3): qty 100

## 2018-11-25 MED ORDER — LABETALOL HCL 200 MG PO TABS
200.0000 mg | ORAL_TABLET | Freq: Two times a day (BID) | ORAL | Status: DC
  Administered 2018-11-26 – 2018-11-28 (×4): 200 mg via ORAL
  Filled 2018-11-25 (×4): qty 1

## 2018-11-25 MED ORDER — SODIUM CHLORIDE 0.9 % IV SOLN
INTRAVENOUS | Status: DC
  Administered 2018-11-25: 15:00:00 via INTRAVENOUS

## 2018-11-25 NOTE — ED Notes (Signed)
CBG at 1620 341

## 2018-11-25 NOTE — ED Notes (Signed)
CRITICAL VALUE ALERT  Critical Value:  CL 59, Glucose 518  Date & Time Notied:  1617, 11/25/2018  Provider Notified: Dr. Laverta Baltimore  Orders Received/Actions taken: no new orders

## 2018-11-25 NOTE — ED Triage Notes (Signed)
Patient has missed 4 dialysis treatments due to inability to obtain a covid test. Patient's current CBG is over 600.

## 2018-11-25 NOTE — ED Provider Notes (Signed)
[facility] EMERGENCY DEPARTMENT Provider Note   CSN: [id number] Arrival date & time: 11/25/18  1349     History   Chief Complaint Chief Complaint  Patient presents with  . Hyperglycemia    HPI Brent Daniels is a 33 y.o. male.     HPI  This patient is an ill-appearing 33 year old male with a known history of diabetes type 1, end-stage renal disease with gastroparesis, chronic back pain, chronic abdominal pain as well as a history of seizures.  He presents to the hospital today after not having dialysis for the last 4 sessions.  It is not clear why this is happened but he reports that he is not allowed to come without a negative coronavirus test.  The patient states he has not been able to get a test, review of the medical record shows the last time he was seen in our system was for a left basilar vein transposition done by Dr. Doren Custard on August 21 of 2020 approximately 2 months ago.  The patient reports that he has had progressive abdominal discomfort, progressive vomiting, has not had dialysis in several days and is very weak.  This is gradually worsening, has now become severe and his blood sugar was noted to be well over 600 prior to arrival.  He is too high to measure on arrival.  Past Medical History:  Diagnosis Date  . Acute esophagitis   . Anemia   . Anxiety   . Arthritis   . Bicuspid aortic valve    noted on 10/20/16 TEE [facility])  . Chronic abdominal pain   . Chronic back pain   . Depression   . Diabetes mellitus    type 1  . Diabetes mellitus without complication ([place])   . Diabetic gastroparesis associated with type 1 diabetes mellitus ([place])   . Erosive esophagitis 12/23/2013   "severe" per EGD   . ESRD (end stage renal disease) on dialysis ([place])   . Gastroparesis   . GERD (gastroesophageal reflux disease)   . Headache    in the past  . Heart murmur    mild TR, mild MR, mild AS 02/19/17  . History of hiatal hernia   . Hypercholesteremia   . Hypertension   .  Insomnia   . Nausea and vomiting    chronic, recurrent  . Neuropathy   . Pneumonia   . PUD (peptic ulcer disease)   . Renal disorder   . S/P arthroscopic knee surgery 07/04/2011  . Scoliosis 07/11/2012  . Seizure [facility])     Patient Active Problem List   Diagnosis Date Noted  . Gastroparesis due to DM ([place]) 08/20/2018  . Nausea and vomiting 08/19/2018  . Uncontrolled hypertension 08/19/2018  . Intractable vomiting   . Acute respiratory failure ([place])   . Poor dentition   . FUO (fever of unknown origin)   . Seizures ([place]) 06/27/2018  . Hypertensive emergency 06/27/2018  . ESRD (end stage renal disease) on dialysis ([place]) 06/27/2018  . Diabetes type 2, uncontrolled ([place]) 06/27/2018  . Encephalopathy acute 06/26/2018  .  02/01/2018  . DKA (diabetic ketoacidosis) ([place]) 10/17/2017  . Epididymitis 10/17/2017  . Sepsis ([place]) 10/16/2017  . Cellulitis 10/16/2017  . Hydrocele 10/16/2017  . Intractable vomiting with nausea 05/30/2017  . Renal failure (ARF), acute on chronic (HCC) 05/30/2017  . Hyperkalemia 05/29/2017  . Current smoker 01/01/2017  . Acute renal failure ([place])   . ATN (acute tubular necrosis) ([place])   . Erosive esophagitis   . Uremia 11/16/2016  . Neuropathy 11/16/2016  . Vitamin D deficiency 11/16/2016  . CKD (chronic kidney disease), stage V ([place]) 11/16/2016  . Acute renal failure superimposed on chronic kidney disease ([place]) 08/27/2016  . Intractable vomiting   . Volume depletion 08/24/2016  . Narcotic withdrawal ([place]) 08/24/2016  . Hypochloremic alkalosis 08/24/2016  . Diabetic hyperosmolar non-ketotic state ([place]) 08/24/2016  . Chronic abdominal pain 08/24/2016  . Diabetic gastroparesis ([place]) 08/24/2016  . Uncontrolled type 1 diabetes mellitus with hyperglycemia, with  long-term current use of insulin ([place]) 08/24/2016  . Dehydration   . Reflux esophagitis 05/14/2015  . Nausea & vomiting 11/13/2014  . AKI (acute kidney injury) ([place]) 11/13/2014  . Hyperglycemia due to type 1 diabetes mellitus ([place]) 04/24/2014  . RUQ abdominal pain 04/14/2014  . DKA (diabetic ketoacidoses) ([place]) 04/03/2014  . Gastroparesis 03/30/2014  . Drug abuse ([place]) 03/30/2014  . Tobacco use 03/30/2014  . HLD (hyperlipidemia) 03/30/2014  . Oropharyngeal candidiasis   . Metabolic acidosis   . Abdominal fluid collection   . Abdominal pain, acute   . Esophagitis 01/03/2014  . Nausea with vomiting   . Acute esophagitis   . Hyperglycemia 12/20/2013  . Abdominal pain 12/09/2013  . PUD (peptic ulcer disease) 10/29/2013  . Leukocytosis 09/29/2013  . Tachycardia 09/29/2013  . Intractable nausea and vomiting 09/28/2013  . Chronic generalized abdominal pain 09/28/2013  . Diabetic gastroparesis associated with type 1 diabetes mellitus ([place]) 09/28/2013  . Hematemesis 09/28/2013  . Diabetic neuropathy ([place]) 09/28/2013  . Abdominal pain, epigastric 08/19/2013  . Uncontrolled diabetes mellitus ([place]) 06/15/2013  . GERD (gastroesophageal reflux disease) 06/15/2013  . DKA, type 1 ([place]) 06/15/2013  . Essential hypertension, benign 07/11/2012  . Diabetes mellitus with stage 5 chronic kidney disease ([place]) 07/11/2012  . Scoliosis 07/11/2012  . Back pain 07/11/2012  . S/P arthroscopic knee surgery 07/04/2011    Past Surgical History:  Procedure Laterality Date  . AV FISTULA PLACEMENT Right 04/09/2017   Procedure: ARTERIOVENOUS (AV) FISTULA CREATION RIGHT ARM;  Surgeon: Rosetta Posner, MD;  Location: [place];  Service: Vascular;  Laterality: Right;  . BASCILIC VEIN TRANSPOSITION Left 09/27/2018   Procedure: BASILIC VEIN TRANSPOSITION Left Arm;  Surgeon: Angelia Mould, MD;  Location: [place];  Service: Vascular;  Laterality: Left;  . BIOPSY  06/17/2013   Procedure: GASTRIC ULCER AND ANTRAL  BIOPSIES; ESOPHAGEAL BRUSHING;  Surgeon: Danie Binder, MD;  Location: [facility];  Service: Endoscopy;;  . BIOPSY N/A 12/23/2013   Procedure: ESOPHAGEAL BIOPSY;  Surgeon: Daneil Dolin, MD;  Location: [facility];  Service: Endoscopy;  Laterality: N/A;  . CHOLECYSTECTOMY N/A 01/28/2014   Procedure: LAPAROSCOPIC CHOLECYSTECTOMY;  Surgeon: Jamesetta So, MD;  Location: [facility];  Service: General;  Laterality: N/A;  . ESOPHAGOGASTRODUODENOSCOPY (EGD) WITH PROPOFOL  06/17/2013   Dr. Oneida Alar: two gastric ulcers in fundus, mild antral gastritis, candida esophagitis  . ESOPHAGOGASTRODUODENOSCOPY (EGD) WITH PROPOFOL N/A 09/11/2013   Dr. [name]: abnormal hypopharynx, question massively enlarged tonsils, stomach full of food precluded exam, needs EGD for verification of ulcer healing at a  later date  . ESOPHAGOGASTRODUODENOSCOPY (EGD) WITH PROPOFOL N/A 12/23/2013   RMR: Severe exudative esophagitis likely predominatly reflux related. Superimposed Candida infection not excluded status post KOH brushing and biopsy. Localized excoriating gastric mucosa most consistant with trauma(vomiting). No evidence of peptic ulcer disease or other gastric/duodenal pathology. I suspect severe inflammation involving the distal esophagus may account  for at least some of patii  . EYE SURGERY    . INCISION AND DRAINAGE ABSCESS N/A 10/19/2017   Procedure: INCISION AND DRAINAGE ABSCESS SCROTAL EXPLORATION,WASHOUT, [place] OF TISSUE;  Surgeon: Ardis Hughs, MD;  Location: [place];  Service: Urology;  Laterality: N/A;  . IR FLUORO GUIDE CV LINE RIGHT  05/03/2018  . IR US GUIDE VASC ACCESS RIGHT  05/03/2018  . IRRIGATION AND DEBRIDEMENT ABSCESS N/A 10/21/2017   Procedure: IRRIGATION AND DEBRIDEMENT ABSCESS;  Surgeon: Ardis Hughs, MD;  Location: [place];  Service: Urology;  Laterality: N/A;  . KNEE ARTHROSCOPY  06/30/2011   Procedure: ARTHROSCOPY KNEE;  Surgeon: Carole Civil, MD;  Location: [facility];  Service: Orthopedics;   Laterality: Right;  diagnostic arthroscopy  . TYMPANOSTOMY TUBE PLACEMENT    . WISDOM TOOTH EXTRACTION          Home Medications    Prior to Admission medications   Medication Sig Start Date End Date Taking? Authorizing Provider  BAYER MICROLET LANCETS lancets Use as instructed to test sugar 5 times daily 10/16/17   Elayne Snare, MD  Buprenorphine HCl-Naloxone HCl 8-2 MG FILM Place 1 Film under the tongue 3 (three) times daily. 06/14/18   [provider]  Continuous Blood Gluc Receiver (FREESTYLE LIBRE 14 DAY READER) [place] 1 each by Does not apply route every 14 (fourteen) days. Patient not taking: Reported on 09/11/2018 04/05/17   Cassandria Anger, MD  DULoxetine (CYMBALTA) 30 MG capsule Take 30 mg by mouth 2 (two) times daily. 05/15/18   [provider]  famotidine (PEPCID) 20 MG tablet Take 20 mg by mouth 2 (two) times daily.    [provider]  furosemide (LASIX) 40 MG tablet Take 40 mg by mouth daily.  09/13/17   [provider]  glucose blood (CONTOUR NEXT TEST) test strip Use as instructed to test sugar 5 times daily 09/19/17   Elayne Snare, MD  glucose blood (ONETOUCH VERIO) test strip Use to test blood sugar two times daily 12/12/17   Elayne Snare, MD  insulin aspart (NOVOLOG FLEXPEN) 100 UNIT/ML FlexPen Inject 5-10 Units into the skin 3 (three) times daily with meals. Per sliding scale    [provider]  Insulin Pen Needle (CARETOUCH PEN NEEDLES) 31G X 8 MM MISC To use with Basaglar and Humalog pens, 4 times daily 10/23/17   Elayne Snare, MD  labetalol (NORMODYNE) 200 MG tablet Take 1 tablet (200 mg total) by mouth 3 (three) times daily. Patient taking differently: Take 200 mg by mouth 2 (two) times daily.  08/21/18   Regalado, Belkys A, MD  LEVEMIR FLEXTOUCH 100 UNIT/ML Pen Inject 20 units Twice a day. Patient taking differently: Inject 20 Units into the skin 2 (two) times daily. Inject 20 units Twice a day. 08/21/18   Regalado, Belkys A, MD   lisinopril (ZESTRIL) 2.5 MG tablet Take 2.5 mg by mouth daily.    [provider]  metoCLOPramide (REGLAN) 10 MG tablet Take 10 mg by mouth 3 (three) times daily before meals.    [provider]  NOVOFINE 32G X 6 MM MISC USE TO INJECT INSULIN  FIVE TIMES DAILY 06/19/18   [provider]  pantoprazole (PROTONIX) 40 MG tablet Take 40 mg by mouth daily.    [provider]  promethazine (PHENERGAN) 25 MG tablet Take 25 mg by mouth every 6 (six) hours as needed for nausea or vomiting.  09/15/17   [provider]  tiZANidine (ZANAFLEX) 4 MG tablet Take 2 mg by mouth every 6 (six) hours as needed for muscle spasms.     [provider]    Family History Family History  Problem Relation Age of Onset  . Cancer Father   . Alcohol abuse Father   . Diabetes Father   . Diabetes Paternal Grandfather   . Pseudochol deficiency Neg Hx   . Malignant hyperthermia Neg Hx   . Hypotension Neg Hx   . Anesthesia problems Neg Hx   . Colon cancer Neg Hx     Social History Social History   Tobacco Use  . Smoking status: Current Every Day Smoker    Packs/day: 1.00    Years: 20.00    Pack years: 20.00    Types: Cigarettes  . Smokeless tobacco: Never Used  Substance Use Topics  . Alcohol use: No  . Drug use: No     Allergies   Hydrocodone bitartrate er, Tramadol, Ibuprofen, Naproxen, Sulfa antibiotics, Ketorolac, and Nsaids   Review of Systems Review of Systems  All other systems reviewed and are negative.    Physical Exam Updated Vital Signs BP 137/76 (BP Location: Left Arm)   Pulse (!) 107   Temp 98.1 F (36.7 C) (Oral)   Resp 18   Ht 1.854 m (6\' 1" )   Wt 83.9 kg   SpO2 99%   BMI 24.41 kg/m   Physical Exam Vitals signs and nursing note reviewed.  Constitutional:      General: He is in acute distress.     Appearance: He is well-developed. He is ill-appearing.  HENT:     Head: Normocephalic and atraumatic.     Mouth/Throat:      Pharynx: No oropharyngeal exudate.  Eyes:     General: No scleral icterus.       Right eye: No discharge.        Left eye: No discharge.     Conjunctiva/sclera: Conjunctivae normal.     Pupils: Pupils are equal, round, and reactive to light.  Neck:     Musculoskeletal: Normal range of motion and neck supple.     Thyroid: No thyromegaly.     Vascular: No JVD.  Cardiovascular:     Rate and Rhythm: Normal rate and regular rhythm.     Heart sounds: Normal heart sounds. No murmur. No friction rub. No gallop.      Comments: Fistula palpated in the left arm, no redness or overlying warmth, dialysis catheter in the right upper chest wall present Pulmonary:     Effort: Pulmonary effort is normal. No respiratory distress.     Breath sounds: Normal breath sounds. No wheezing or rales.  Abdominal:     General: Bowel sounds are normal. There is no distension.     Palpations: Abdomen is soft. There is no mass.     Tenderness: There is abdominal tenderness.     Comments: Mid abdominal tenderness but no guarding, no muscular rigidity  Musculoskeletal: Normal range of motion.        General: No tenderness.  Lymphadenopathy:     Cervical: No cervical adenopathy.  Skin:    General: Skin is  warm and dry.     Findings: No erythema or rash.  Neurological:     Mental Status: He is alert.     Coordination: Coordination normal.     Comments: Somnolent, arousable, able to follow commands, generally weak diffusely  Psychiatric:        Behavior: Behavior normal.      ED Treatments / Results  Labs (all labs ordered are listed, but only abnormal results are displayed) Labs Reviewed  CBG MONITORING, ED - Abnormal; Notable for the following components:      Result Value   Glucose-Capillary >600 (*)    All other components within normal limits  SARS CORONAVIRUS 2 (TAT 6-24 HRS)  CBC WITH DIFFERENTIAL/PLATELET  COMPREHENSIVE METABOLIC PANEL  URINALYSIS, ROUTINE W REFLEX MICROSCOPIC  MAGNESIUM   ALKALINE PHOSPHATASE  BLOOD GAS, VENOUS  LIPASE, BLOOD  CBG MONITORING, ED    EKG EKG Interpretation  Date/Time:  Monday November 25 2018 15:00:23 EDT Ventricular Rate:  104 PR Interval:    QRS Duration: 93 QT Interval:  394 QTC Calculation: 519 R Axis:   59 Text Interpretation:  Sinus tachycardia LAE, consider biatrial enlargement Probable left ventricular hypertrophy Prolonged QT interval Baseline wander in lead(s) V1 since last tracing no significant change Confirmed by Noemi Chapel [phone]) on 11/25/2018 3:19:29 PM   Radiology No results found.  Procedures Procedures (including critical care time)  Medications Ordered in ED Medications  ondansetron (ZOFRAN) injection 4 mg (has no administration in time range)  0.9 %  sodium chloride infusion ( Intravenous New Bag/Given 11/25/18 1507)  insulin regular, human (MYXREDLIN) 100 units/ 100 mL infusion (has no administration in time range)  dextrose 5 %-0.45 % sodium chloride infusion (has no administration in time range)  fentaNYL (SUBLIMAZE) injection 50 mcg (50 mcg Intravenous Given 11/25/18 1504)  ondansetron (ZOFRAN) injection 4 mg (4 mg Intravenous Given 11/25/18 1504)     Initial Impression / Assessment and Plan / ED Course  I have reviewed the triage vital signs and the nursing notes.  Pertinent labs & imaging results that were available during my care of the patient were reviewed by me and considered in my medical decision making (see chart for details).        At the minimum this patient is severely hyperglycemic but is mildly tachycardic, afebrile and normotensive.  I am concerned for hyperkalemia or other consequences of missed dialysis.  He does not appear to be fluid overloaded on his clinical exam however chest x-ray and labs will be ordered.  EKG to assess degree of hyperkalemia, insulin will be started, the patient will need further management of this likely in an inpatient area where he can get dialysis.   At the change of shift, the care was signed out to Dr. Laverta Baltimore, he will follow up the results.  Final Clinical Impressions(s) / ED Diagnoses   Final diagnoses:  None    ED Discharge Orders    None       Noemi Chapel, MD 11/25/18 [phone]

## 2018-11-25 NOTE — ED Provider Notes (Signed)
Blood pressure 137/76, pulse (!) 107, temperature 98.1 F (36.7 C), temperature source Oral, resp. rate 18, height 6\' 1"  (1.854 m), weight 83.9 kg, SpO2 99 %.  Assuming care from Dr. Sabra Heck.  In short, Brent Daniels is a 33 y.o. male with a chief complaint of Hyperglycemia .  Refer to the original H&P for additional details.  The current plan of care is to f/u on labs and likely admit.  04:45 PM  Delay in lab work coming back from the lab.  Patient appears to be in DKA.  Potassium is 3.3.  Insulin infusion started along with potassium supplementation IV.  Reevaluated the patient.  He continues to be drowsy but maintaining his airway.  No active vomiting.  Will discuss with nephrology.  His BUN is significantly elevated.  No acute volume overload or other indication for emergency dialysis but will need it while inpatient. TRH paged as well.   Discussed patient's case with TRH, Dr. Denton Brick to request admission. Patient and family (if present) updated with plan. Care transferred to [facility] service.  I reviewed all nursing notes, vitals, pertinent old records, EKGs, labs, imaging (as available).  05:55 PM  Spoke with Nephrology Dr. Hollie Salk. She will arrange AM HD [facility] at [facility].   CRITICAL CARE Performed by: Margette Fast Total critical care time: 35 minutes Critical care time was exclusive of separately billable procedures and treating other patients. Critical care was necessary to treat or prevent imminent or life-threatening deterioration. Critical care was time spent personally by me on the following activities: development of treatment plan with patient and/or surrogate as well as nursing, discussions with consultants, evaluation of patient's response to treatment, examination of patient, obtaining history from patient or surrogate, ordering and performing treatments and interventions, ordering and review of laboratory studies, ordering and review of radiographic studies, pulse oximetry and  re-evaluation of patient's condition.  Nanda Quinton, MD Emergency Medicine     Long, Wonda [facility], MD 11/25/18 [phone]

## 2018-11-25 NOTE — H&P (Addendum)
History and Physical    Brent Daniels [id number] DOB: [birth year] DOA: 11/25/2018  PCP: Glenda Chroman, MD   Patient coming from: Home  I have personally briefly reviewed patient's old medical records in [place]  Chief Complaint: Missed HD sessions  HPI: Brent Daniels is a 33 y.o. male with medical history significant for ESRD, DM with gastroparesis, hypertension, seizures, depression, peptic ulcer disease.  History is obtained mostly from chart review, with time of my evaluation patient is awake and appears alert, but he is refusing to answer any questions or follow any directions.  History is obtained from chart review, EDP. Patient presented to the ED reports that he has missed 4 of his last HD sessions.  This was because he was told he was not allowed to come without the negative Covid test.  He also reported progressive abdominal discomfort, vomiting and feeling weak.   ED Course: Heart rate 107.  WBC 19.1.  Sodium 122, with low chloride 59, low serum bicarb 20, potassium 3.2.  Elevated BUN 120, blood glucose 518, anion gap greater than 20.  Venous blood gas pH of 7.4.  PCO2 of 36.  Portable chest x-ray showed interval cardiomegaly with interval mild changes of congestive heart failure.  EKG- sinus  tachycardia with prolonged QTC 519.  Insulin GTT started. EDP talked to nephrologist on-call, plans to dialyze patient in a.m.  Review of Systems: As per HPI all other systems reviewed and negative.  Past Medical History:  Diagnosis Date  . Acute esophagitis   . Anemia   . Anxiety   . Arthritis   . Bicuspid aortic valve    noted on 10/20/16 [facility])  . Chronic abdominal pain   . Chronic back pain   . Depression   . Diabetes mellitus    type 1  . Diabetes mellitus without complication ([place])   . Diabetic gastroparesis associated with type 1 diabetes mellitus ([place])   . Erosive esophagitis 12/23/2013   "severe" per EGD   . ESRD (end stage renal disease) on dialysis  ([place])   . Gastroparesis   . GERD (gastroesophageal reflux disease)   . Headache    in the past  . Heart murmur    mild TR, mild MR, mild AS 02/19/17  . History of hiatal hernia   . Hypercholesteremia   . Hypertension   . Insomnia   . Nausea and vomiting    chronic, recurrent  . Neuropathy   . Pneumonia   . PUD (peptic ulcer disease)   . Renal disorder   . S/P arthroscopic knee surgery 07/04/2011  . Scoliosis 07/11/2012  . Seizure [facility])     Past Surgical History:  Procedure Laterality Date  . AV FISTULA PLACEMENT Right 04/09/2017   Procedure: ARTERIOVENOUS (AV) FISTULA CREATION RIGHT ARM;  Surgeon: Rosetta Posner, MD;  Location: [place];  Service: Vascular;  Laterality: Right;  . BASCILIC VEIN TRANSPOSITION Left 09/27/2018   Procedure: BASILIC VEIN TRANSPOSITION Left Arm;  Surgeon: Angelia Mould, MD;  Location: [place];  Service: Vascular;  Laterality: Left;  . BIOPSY  06/17/2013   Procedure: GASTRIC ULCER AND ANTRAL BIOPSIES; ESOPHAGEAL BRUSHING;  Surgeon: Danie Binder, MD;  Location: [facility];  Service: Endoscopy;;  . BIOPSY N/A 12/23/2013   Procedure: ESOPHAGEAL BIOPSY;  Surgeon: Daneil Dolin, MD;  Location: [facility];  Service: Endoscopy;  Laterality: N/A;  . CHOLECYSTECTOMY N/A 01/28/2014   Procedure: LAPAROSCOPIC CHOLECYSTECTOMY;  Surgeon: Jamesetta So, MD;  Location: [facility];  Service: General;  Laterality: N/A;  . ESOPHAGOGASTRODUODENOSCOPY (EGD) WITH PROPOFOL  06/17/2013   Dr. Oneida Alar: two gastric ulcers in fundus, mild antral gastritis, candida esophagitis  . ESOPHAGOGASTRODUODENOSCOPY (EGD) WITH PROPOFOL N/A 09/11/2013   Dr. [name]: abnormal hypopharynx, question massively enlarged tonsils, stomach full of food precluded exam, needs EGD for verification of ulcer healing at a later date  . ESOPHAGOGASTRODUODENOSCOPY (EGD) WITH PROPOFOL N/A 12/23/2013   RMR: Severe exudative esophagitis likely predominatly reflux related. Superimposed Candida infection not excluded status post  KOH brushing and biopsy. Localized excoriating gastric mucosa most consistant with trauma(vomiting). No evidence of peptic ulcer disease or other gastric/duodenal pathology. I suspect severe inflammation involving the distal esophagus may account  for at least some of patii  . EYE SURGERY    . INCISION AND DRAINAGE ABSCESS N/A 10/19/2017   Procedure: INCISION AND DRAINAGE ABSCESS SCROTAL EXPLORATION,WASHOUT, Thermal OF TISSUE;  Surgeon: Ardis Hughs, MD;  Location: [place];  Service: Urology;  Laterality: N/A;  . IR FLUORO GUIDE CV LINE RIGHT  05/03/2018  . IR US GUIDE VASC ACCESS RIGHT  05/03/2018  . IRRIGATION AND DEBRIDEMENT ABSCESS N/A 10/21/2017   Procedure: IRRIGATION AND DEBRIDEMENT ABSCESS;  Surgeon: Ardis Hughs, MD;  Location: [place];  Service: Urology;  Laterality: N/A;  . KNEE ARTHROSCOPY  06/30/2011   Procedure: ARTHROSCOPY KNEE;  Surgeon: Carole Civil, MD;  Location: [facility];  Service: Orthopedics;  Laterality: Right;  diagnostic arthroscopy  . TYMPANOSTOMY TUBE PLACEMENT    . WISDOM TOOTH EXTRACTION       reports that he has been smoking cigarettes. He has a 20.00 pack-year smoking history. He has never used smokeless tobacco. He reports that he does not drink alcohol or use drugs.  Allergies  Allergen Reactions  . Hydrocodone Bitartrate Er Anaphylaxis    Anaphylaxis Can take plain Tylenol  . Tramadol Anaphylaxis, Nausea Only and Other (See Comments)    Acid Reflux  . Ibuprofen Nausea And Vomiting and Other (See Comments)    Stomach ulcers  . Naproxen Other (See Comments)    Stomach ulcers  . Sulfa Antibiotics Rash and Other (See Comments)    Stomach ulcers   . Ketorolac Other (See Comments)    Unknown  . Nsaids Nausea And Vomiting    Family History  Problem Relation Age of Onset  . Cancer Father   . Alcohol abuse Father   . Diabetes Father   . Diabetes Paternal Grandfather   . Pseudochol deficiency Neg Hx   . Malignant hyperthermia Neg Hx   .  Hypotension Neg Hx   . Anesthesia problems Neg Hx   . Colon cancer Neg Hx     Prior to Admission medications   Medication Sig Start Date End Date Taking? Authorizing Provider  Buprenorphine HCl-Naloxone HCl 8-2 MG FILM Place 1 Film under the tongue 3 (three) times daily. 06/14/18  Yes [provider]  dicyclomine (BENTYL) 10 MG capsule Take 10 mg by mouth 4 (four) times daily as needed for spasms (abdominal pain).   Yes [provider]  DULoxetine (CYMBALTA) 30 MG capsule Take 30 mg by mouth 2 (two) times daily. 05/15/18  Yes [provider]  famotidine (PEPCID) 20 MG tablet Take 20 mg by mouth 2 (two) times daily.   Yes [provider]  furosemide (LASIX) 40 MG tablet Take 40 mg by mouth daily.  09/13/17  Yes [provider]  insulin aspart (NOVOLOG FLEXPEN) 100 UNIT/ML FlexPen Inject  5-10 Units into the skin 3 (three) times daily with meals. Per sliding scale   Yes [provider]  labetalol (NORMODYNE) 200 MG tablet Take 1 tablet (200 mg total) by mouth 3 (three) times daily. Patient taking differently: Take 200 mg by mouth 2 (two) times daily.  08/21/18  Yes Regalado, Belkys A, MD  LEVEMIR FLEXTOUCH 100 UNIT/ML Pen Inject 20 units Twice a day. Patient taking differently: Inject 20 Units into the skin 2 (two) times daily.  08/21/18  Yes Regalado, Belkys A, MD  lisinopril (ZESTRIL) 2.5 MG tablet Take 2.5 mg by mouth daily.   Yes [provider]  Metoclopramide HCl 5 MG TBDP Take 5 mg by mouth 4 (four) times daily -  before meals and at bedtime.    Yes [provider]  ondansetron (ZOFRAN) 4 MG tablet Take 4 mg by mouth every 6 (six) hours as needed for nausea or vomiting.   Yes [provider]  pantoprazole (PROTONIX) 40 MG tablet Take 40 mg by mouth daily.   Yes [provider]  promethazine (PHENERGAN) 25 MG tablet Take 25 mg by mouth every 6 (six) hours as needed for nausea or vomiting.  09/15/17  Yes [provider]  tiZANidine (ZANAFLEX) 4 MG tablet Take 4 mg by mouth every 6 (six) hours as needed for muscle spasms.    Yes [provider]    Physical Exam: Vitals:   11/25/18 1405 11/25/18 1406 11/25/18 1530 11/25/18 1730  BP: 137/76  120/78 124/76  Pulse: (!) 107  100 (!) 107  Resp: 18  13 14   Temp: 98.1 F (36.7 C)     TempSrc: Oral     SpO2: 99%  99% 99%  Weight:  83.9 kg    Height:  6\' 1"  (1.854 m)      Constitutional: NAD, calm, comfortable Vitals:   11/25/18 1405 11/25/18 1406 11/25/18 1530 11/25/18 1730  BP: 137/76  120/78 124/76  Pulse: (!) 107  100 (!) 107  Resp: 18  13 14   Temp: 98.1 F (36.7 C)     TempSrc: Oral     SpO2: 99%  99% 99%  Weight:  83.9 kg    Height:  6\' 1"  (1.854 m)     Eyes: PERRL, lids and conjunctivae normal ENMT: Mucous membranes are moist. Posterior pharynx clear of any exudate or lesions. Neck: normal, supple, no masses, no thyromegaly Respiratory: Normal respiratory effort. No accessory muscle use.  Cardiovascular: Mildly tachycardic, regular rate and rhythm, no murmurs / rubs / gallops. No extremity edema. 2+ pedal pulses. No carotid bruits.  Abdomen: no tenderness, soft. Musculoskeletal: no clubbing / cyanosis. No joint deformity upper and lower extremities. Good ROM, no contractures. Normal muscle tone.  Skin: no rashes, lesions, ulcers. No induration Neurologic: Not following directions on exam unable to fully assess.  No obvious deficits.   Psychiatric: Awake, slightly lethargic, not following directions, sitting up in bed, eyes open, tracking.  Labs on Admission: I have personally reviewed following labs and imaging studies  CBC: Recent Labs  Lab 11/25/18 1458  WBC 19.1*  NEUTROABS 15.7*  HGB 12.7*  HCT 38.1*  MCV 92.0  PLT [id number]   Basic Metabolic Panel: Recent Labs  Lab 11/25/18 1458  NA 122*  K 3.2*  CL 59*  CO2 20*  GLUCOSE 518*  BUN 120*  CREATININE 13.84*  CALCIUM 7.7*  MG 2.8*   Liver Function  Tests: Recent Labs  Lab 11/25/18 1458  AST 17  ALT 16  ALKPHOS 120  BILITOT 2.0*  PROT 8.0  ALBUMIN 4.4   Recent Labs  Lab 11/25/18 1458  LIPASE 17   CBG: Recent Labs  Lab 11/25/18 1403 11/25/18 1618 11/25/18 1730 11/25/18 1845  GLUCAP >600* 344* 265* 190*    Radiological Exams on Admission: Dg Chest Portable 1 View  Result Date: 11/25/2018 CLINICAL DATA:  Shortness of breath. Missed 4 dialysis treatments due to inability to obtain a COVID-19 test. EXAM: PORTABLE CHEST 1 VIEW COMPARISON:  07/18/2018 FINDINGS: Enlarged cardiac silhouette with an interval increase in size. Mild increase in prominence of the pulmonary vasculature and interstitial markings. No pleural fluid. IMPRESSION: Interval cardiomegaly with interval mild changes of congestive heart failure. Electronically Signed   By: Claudie Revering M.D.   On: 11/25/2018 16:28    EKG: Independently reviewed.  Sinus tachycardia, prolonged QTC 519.  LVH.  No significant change from prior.  Assessment/Plan Active Problems:   DKA (diabetic ketoacidoses) ([place])   DKA- Unable to assess since compliance with home insulins.  Glucose 518, anion gap greater than 20, serum bicarb 20.  Venous blood gas pH 7.4, PCO2 36. -DKA protocol with insulin GTT. -Glucose 518 >> 190.  -BMP every 4 hourly x2 -Cautious hydration -Addendum-anion gap still > 20, serum bicarb has corrected to 24.  Hard to tell how much of this is from uremia.  Will continue insulin drip gtt for now, with D5 normal saline at 40cc/hr. Allow clear liquid diet.  Metabolic encephalopathy with anion gap metabolic acidosis- likely combination of DKA and uremia. with BUN of 120. -Hydrate -HD in a.m.  ESRD-missed last 4 HD sessions.  Schedule Tuesday Thursday Saturday.  Marked BUN elevation at 120.  Potassium 3.2. - Nephrology consulted, plan for HD in a.m.  Electrolyte abnormalities- hypokalemia 3.2, hypochloremia 59.  Pseudo-hyponatremia 122-corrected 129.  Likely  from dehydration and DKA, ?  Missed HD sessions -Replete  Hypertension-stable. -Hold home Lasix, lisinopril, ? compliance -Resume home labetalol, in a.m.  Prolonged QTC-519.  Likely from metabolic/electrolyte derangements.  Hx of Seizures-unable clarify history at this time.  Not on antiseizure medications.  Depression, gastroparesis-  -Hold Cymbalta for now with prolonged QTC 518.  DVT prophylaxis: Heparin Code Status: Full code Family Communication: Bedside Disposition Plan: > 2 days Consults called: Nephrology Admission status: Inpatient, stepdown I certify that at the point of admission it is my clinical judgment that the patient will require inpatient hospital care spanning beyond 2 midnights from the point of admission due to high intensity of service, high risk for further deterioration and high frequency of surveillance required. The following factors support the patient status of inpatient: DKA and uremia requiring insulin GTT and close monitoring, and his stepdown level of care.   Bethena Roys MD [facility]  11/25/2018, 7:58 PM

## 2018-11-26 ENCOUNTER — Encounter (HOSPITAL_COMMUNITY): Payer: Self-pay

## 2018-11-26 ENCOUNTER — Other Ambulatory Visit: Payer: Self-pay

## 2018-11-26 LAB — BASIC METABOLIC PANEL
Anion gap: >20 — ABNORMAL HIGH (ref 5–15)
Anion gap: >20 — ABNORMAL HIGH (ref 5–15)
Anion gap: >20 — ABNORMAL HIGH (ref 5–15)
BUN: 124 mg/dL — ABNORMAL HIGH (ref 6–20)
BUN: 120 mg/dL — ABNORMAL HIGH (ref 6–20)
BUN: 120 mg/dL — ABNORMAL HIGH (ref 6–20)
BUN: 115 mg/dL — ABNORMAL HIGH (ref 6–20)
BUN: 124 mg/dL — ABNORMAL HIGH (ref 6–20)
CO2: 26 mmol/L (ref 22–32)
CO2: 20 mmol/L — ABNORMAL LOW (ref 22–32)
CO2: 17 mmol/L — ABNORMAL LOW (ref 22–32)
CO2: 26 mmol/L (ref 22–32)
CO2: 28 mmol/L (ref 22–32)
Calcium: 7.4 mg/dL — ABNORMAL LOW (ref 8.9–10.3)
Calcium: 7.4 mg/dL — ABNORMAL LOW (ref 8.9–10.3)
Calcium: 7.1 mg/dL — ABNORMAL LOW (ref 8.9–10.3)
Calcium: 7.8 mg/dL — ABNORMAL LOW (ref 8.9–10.3)
Calcium: 7.3 mg/dL — ABNORMAL LOW (ref 8.9–10.3)
Chloride: 71 mmol/L — ABNORMAL LOW (ref 98–111)
Chloride: 69 mmol/L — ABNORMAL LOW (ref 98–111)
Chloride: 68 mmol/L — ABNORMAL LOW (ref 98–111)
Chloride: 72 mmol/L — ABNORMAL LOW (ref 98–111)
Chloride: 71 mmol/L — ABNORMAL LOW (ref 98–111)
Creatinine, Ser: 13.99 mg/dL — ABNORMAL HIGH (ref 0.61–1.24)
Creatinine, Ser: 14.01 mg/dL — ABNORMAL HIGH (ref 0.61–1.24)
Creatinine, Ser: 14.42 mg/dL — ABNORMAL HIGH (ref 0.61–1.24)
Creatinine, Ser: 14.02 mg/dL — ABNORMAL HIGH (ref 0.61–1.24)
Creatinine, Ser: 14.33 mg/dL — ABNORMAL HIGH (ref 0.61–1.24)
GFR calc Af Amer: 5 mL/min — ABNORMAL LOW (ref >60)
GFR calc Af Amer: 5 mL/min — ABNORMAL LOW (ref >60)
GFR calc Af Amer: 4 mL/min — ABNORMAL LOW (ref >60)
GFR calc Af Amer: 5 mL/min — ABNORMAL LOW (ref >60)
GFR calc Af Amer: 5 mL/min — ABNORMAL LOW (ref >60)
GFR calc non Af Amer: 4 mL/min — ABNORMAL LOW (ref >60)
GFR calc non Af Amer: 4 mL/min — ABNORMAL LOW (ref >60)
GFR calc non Af Amer: 4 mL/min — ABNORMAL LOW (ref >60)
GFR calc non Af Amer: 4 mL/min — ABNORMAL LOW (ref >60)
GFR calc non Af Amer: 4 mL/min — ABNORMAL LOW (ref >60)
Glucose, Bld: 99 mg/dL (ref 70–99)
Glucose, Bld: 369 mg/dL — ABNORMAL HIGH (ref 70–99)
Glucose, Bld: 445 mg/dL — ABNORMAL HIGH (ref 70–99)
Glucose, Bld: 210 mg/dL — ABNORMAL HIGH (ref 70–99)
Glucose, Bld: 130 mg/dL — ABNORMAL HIGH (ref 70–99)
Potassium: 2.8 mmol/L — ABNORMAL LOW (ref 3.5–5.1)
Potassium: 3.3 mmol/L — ABNORMAL LOW (ref 3.5–5.1)
Potassium: 3.5 mmol/L (ref 3.5–5.1)
Potassium: 3.1 mmol/L — ABNORMAL LOW (ref 3.5–5.1)
Potassium: 3.1 mmol/L — ABNORMAL LOW (ref 3.5–5.1)
Sodium: 129 mmol/L — ABNORMAL LOW (ref 135–145)
Sodium: 127 mmol/L — ABNORMAL LOW (ref 135–145)
Sodium: 125 mmol/L — ABNORMAL LOW (ref 135–145)
Sodium: 129 mmol/L — ABNORMAL LOW (ref 135–145)
Sodium: 125 mmol/L — ABNORMAL LOW (ref 135–145)

## 2018-11-26 LAB — GLUCOSE, CAPILLARY
Glucose-Capillary: 450 mg/dL — ABNORMAL HIGH (ref 70–99)
Glucose-Capillary: 416 mg/dL — ABNORMAL HIGH (ref 70–99)
Glucose-Capillary: 340 mg/dL — ABNORMAL HIGH (ref 70–99)
Glucose-Capillary: 288 mg/dL — ABNORMAL HIGH (ref 70–99)
Glucose-Capillary: 139 mg/dL — ABNORMAL HIGH (ref 70–99)
Glucose-Capillary: 191 mg/dL — ABNORMAL HIGH (ref 70–99)
Glucose-Capillary: 119 mg/dL — ABNORMAL HIGH (ref 70–99)
Glucose-Capillary: 124 mg/dL — ABNORMAL HIGH (ref 70–99)
Glucose-Capillary: 128 mg/dL — ABNORMAL HIGH (ref 70–99)
Glucose-Capillary: 133 mg/dL — ABNORMAL HIGH (ref 70–99)
Glucose-Capillary: 133 mg/dL — ABNORMAL HIGH (ref 70–99)
Glucose-Capillary: 129 mg/dL — ABNORMAL HIGH (ref 70–99)
Glucose-Capillary: 134 mg/dL — ABNORMAL HIGH (ref 70–99)
Glucose-Capillary: 146 mg/dL — ABNORMAL HIGH (ref 70–99)
Glucose-Capillary: 189 mg/dL — ABNORMAL HIGH (ref 70–99)
Glucose-Capillary: 196 mg/dL — ABNORMAL HIGH (ref 70–99)

## 2018-11-26 LAB — MAGNESIUM: Magnesium: 2.5 mg/dL — ABNORMAL HIGH (ref 1.7–2.4)

## 2018-11-26 LAB — MRSA PCR SCREENING: MRSA by PCR: NEGATIVE

## 2018-11-26 LAB — CBG MONITORING, ED
Glucose-Capillary: 121 mg/dL — ABNORMAL HIGH (ref 70–99)
Glucose-Capillary: 94 mg/dL (ref 70–99)
Glucose-Capillary: 117 mg/dL — ABNORMAL HIGH (ref 70–99)
Glucose-Capillary: 342 mg/dL — ABNORMAL HIGH (ref 70–99)

## 2018-11-26 MED ORDER — SODIUM CHLORIDE 0.9 % IV SOLN
INTRAVENOUS | Status: DC
  Administered 2018-11-26: 09:00:00 via INTRAVENOUS

## 2018-11-26 MED ORDER — BUPRENORPHINE HCL-NALOXONE HCL 8-2 MG SL FILM: 1.0000 | ORAL_FILM | Freq: Three times a day (TID) | SUBLINGUAL | Status: DC

## 2018-11-26 MED ORDER — POTASSIUM CHLORIDE CRYS ER 20 MEQ PO TBCR
40.0000 meq | EXTENDED_RELEASE_TABLET | Freq: Once | ORAL | Status: AC
  Administered 2018-11-26: 40 meq via ORAL
  Filled 2018-11-26: qty 2

## 2018-11-26 MED ORDER — BUPRENORPHINE HCL-NALOXONE HCL 8-2 MG SL SUBL
1.0000 | SUBLINGUAL_TABLET | Freq: Three times a day (TID) | SUBLINGUAL | Status: DC
  Administered 2018-11-26 – 2018-11-28 (×7): 1 via SUBLINGUAL
  Filled 2018-11-26 (×7): qty 1

## 2018-11-26 MED ORDER — HEPARIN SODIUM (PORCINE) 1000 UNIT/ML DIALYSIS: 1000.0000 [IU] | INTRAMUSCULAR | Status: DC | PRN

## 2018-11-26 MED ORDER — INSULIN ASPART 100 UNIT/ML ~~LOC~~ SOLN: 0.0000 [IU] | Freq: Every day | SUBCUTANEOUS | Status: DC

## 2018-11-26 MED ORDER — INSULIN GLARGINE 100 UNIT/ML ~~LOC~~ SOLN
10.0000 [IU] | Freq: Every day | SUBCUTANEOUS | Status: DC
  Administered 2018-11-26: 10 [IU] via SUBCUTANEOUS
  Filled 2018-11-26 (×2): qty 0.1

## 2018-11-26 MED ORDER — HEPARIN SODIUM (PORCINE) 1000 UNIT/ML DIALYSIS
3200.0000 [IU] | INTRAMUSCULAR | Status: DC | PRN
  Filled 2018-11-26 (×2): qty 4

## 2018-11-26 MED ORDER — BUPRENORPHINE HCL 2 MG SL SUBL: 8.0000 mg | SUBLINGUAL_TABLET | Freq: Three times a day (TID) | SUBLINGUAL | Status: DC

## 2018-11-26 MED ORDER — DEXTROSE-NACL 5-0.45 % IV SOLN
INTRAVENOUS | Status: DC
  Administered 2018-11-26 – 2018-11-28 (×3): via INTRAVENOUS

## 2018-11-26 MED ORDER — TIZANIDINE HCL 2 MG PO TABS
4.0000 mg | ORAL_TABLET | Freq: Four times a day (QID) | ORAL | Status: DC | PRN
  Administered 2018-11-26 – 2018-11-28 (×7): 4 mg via ORAL
  Filled 2018-11-26 (×7): qty 2

## 2018-11-26 MED ORDER — INSULIN ASPART 100 UNIT/ML ~~LOC~~ SOLN: 0.0000 [IU] | Freq: Three times a day (TID) | SUBCUTANEOUS | Status: DC

## 2018-11-26 MED ORDER — DEXTROSE 50 % IV SOLN: 25.0000 mL | INTRAVENOUS | Status: DC | PRN

## 2018-11-26 MED ORDER — LORAZEPAM 2 MG/ML IJ SOLN
1.0000 mg | INTRAMUSCULAR | Status: DC | PRN
  Administered 2018-11-26 – 2018-11-28 (×7): 1 mg via INTRAVENOUS
  Filled 2018-11-26 (×7): qty 1

## 2018-11-26 MED ORDER — INSULIN REGULAR BOLUS VIA INFUSION
0.0000 [IU] | Freq: Three times a day (TID) | INTRAVENOUS | Status: DC
  Filled 2018-11-26: qty 10

## 2018-11-26 MED ORDER — SODIUM CHLORIDE 0.9 % IV SOLN
Freq: Once | INTRAVENOUS | Status: AC
  Administered 2018-11-26: 04:00:00 via INTRAVENOUS

## 2018-11-26 MED ORDER — ALTEPLASE 2 MG IJ SOLR
2.0000 mg | Freq: Once | INTRAMUSCULAR | Status: AC | PRN
  Administered 2018-11-26: 4 mg

## 2018-11-26 MED ORDER — INSULIN REGULAR(HUMAN) IN NACL 100-0.9 UT/100ML-% IV SOLN
INTRAVENOUS | Status: AC
  Administered 2018-11-26: 0.5 [IU]/h via INTRAVENOUS
  Filled 2018-11-26: qty 100

## 2018-11-26 MED ORDER — LIDOCAINE 5 % EX PTCH
1.0000 | MEDICATED_PATCH | CUTANEOUS | Status: DC
  Administered 2018-11-26 – 2018-11-27 (×2): 1 via TRANSDERMAL
  Filled 2018-11-26 (×3): qty 1

## 2018-11-26 NOTE — Progress Notes (Signed)
Inpatient Diabetes Program Recommendations  AACE/ADA: New Consensus Statement on Inpatient Glycemic Control (2015)  Target Ranges:  Prepandial:   less than 140 mg/dL      Peak postprandial:   less than 180 mg/dL (1-2 hours)      Critically ill patients:  140 - 180 mg/dL   Lab Results  Component Value Date   GLUCAP 416 (H) 11/26/2018   HGBA1C 8.8 (H) 06/27/2018    Review of Glycemic Control  Diabetes history: DM 1 Outpatient Diabetes medications: Levemir 20 units bid, Novolog 5-10 tid Current orders for Inpatient glycemic control: IV insulin gtt per DKA protocol  Inpatient Diabetes Program Recommendations:    Glucose >600 on presentation. IV insulin initially. IV insulin stopped before Levemir given. Tried to transition on Levemir 10 units. Glucose increased >400. IV insulin restarted.  Pt has seen Dr. Dwyane Dee (Endocrinology) in the past for DM.   IV insulin for now per DKA protocol.  At time of transition consider starting patient's home dose of Levemir 20 units bid and overlap it 2 hours with IV insulin. Also Consider Novolog 0-9 units tid + meal coverage if eating.  Thanks,  Tama Headings RN, MSN, BC-ADM Inpatient Diabetes Coordinator Team Pager [phone] (8a-5p)

## 2018-11-26 NOTE — ED Notes (Signed)
Pt screamed so I checked on him and he stated his blood pressure was too high. Pt's pressure was 116/74 so I checked his blood sugar and it was 342. Orders received from hospitalist.

## 2018-11-26 NOTE — Progress Notes (Signed)
PROGRESS NOTE    Brent Daniels  [id number] DOB: Jun 14, 1985 DOA: 11/25/2018 PCP: Glenda Chroman, MD   Brief Narrative:  Per HPI: Brent Daniels is a 33 y.o. male with medical history significant for ESRD, DM with gastroparesis, hypertension, seizures, depression, peptic ulcer disease.  History is obtained mostly from chart review, with time of my evaluation patient is awake and appears alert, but he is refusing to answer any questions or follow any directions.  History is obtained from chart review, EDP. Patient presented to the ED reports that he has missed 4 of his last HD sessions.  This was because he was told he was not allowed to come without the negative Covid test.  He also reported progressive abdominal discomfort, vomiting and feeling weak.   10/20: Patient was admitted with DKA with likely noncompliance with home insulin.  It appears that he has been noncompliant with his hemodialysis sessions as well.  He has been seen by nephrology with plans for hemodialysis today.  He unfortunately was converted from IV insulin drip to subcutaneous form too quickly and has gone back into DKA.  Assessment & Plan:   Active Problems:   DKA (diabetic ketoacidoses) ([place])   DKA with likely poor compliance at home insulin -Continue back on insulin drip -Plan to keep n.p.o. for now and monitor BMP every 4 hours -Resume diet once anion gap resolved and will start D5 drip at gentle rate once blood glucose less -Appreciate diabetes coordinator  Acute metabolic encephalopathy-multifactorial -Related to DKA as well as missed hemodialysis sessions with uremia -Appreciate nephrology for hemodialysis and continue treatment for DKA  ESRD on HD TTS -Appreciate nephrology for hemodialysis today  Hypokalemia/hyponatremia -Likely related to dehydration with DKA and missed HD sessions -Replete and monitor repeat labs -Should improve after hemodialysis today  Hypertension-stable -Hold home Lasix  and lisinopril -Continue home labetalol  Prolonged QTC -Likely secondary to electrolyte disturbances and will continue to monitor  History of seizures -Not currently on antiepileptic agents -Monitor closely and consider EEG should mentation not improve  Depression/gastroparesis -Hold Cymbalta given prolonged QTC   DVT prophylaxis: Heparin Code Status: Full Family Communication: None at bedside Disposition Plan: Continue DKA treatment and start diet once this has resolved.  Hemodialysis per nephrology.   Consultants:   Nephrology  Procedures:   None  Antimicrobials:   None   Subjective: Patient seen and evaluated today with some persistent somnolence noted.  He denies any specific complaints.  Objective: Vitals:   11/26/18 0600 11/26/18 0630 11/26/18 0749 11/26/18 0800  BP: 123/70 122/69 139/72 135/73  Pulse: 99 98 93 93  Resp:   16 11  Temp:   97.8 F (36.6 C)   TempSrc:   Oral   SpO2: 99% 99% 99% 99%  Weight:   79.5 kg   Height:   6\' 1"  (1.854 m)     Intake/Output Summary (Last 24 hours) at 11/26/2018 1028 Last data filed at 11/26/2018 0805 Gross per 24 hour  Intake 755.25 ml  Output -  Net 755.25 ml   Filed Weights   11/25/18 1406 11/26/18 0749  Weight: 83.9 kg 79.5 kg    Examination:  General exam: Appears calm and comfortable  Respiratory system: Clear to auscultation. Respiratory effort normal.  Currently on room air. Cardiovascular system: S1 & S2 heard, RRR. No JVD, murmurs, rubs, gallops or clicks. No pedal edema. Gastrointestinal system: Abdomen is nondistended, soft and nontender. No organomegaly or masses felt. Normal bowel sounds heard. Central  nervous system: Somnolent but arousable Extremities: Symmetric 5 x 5 power. Skin: No rashes, lesions or ulcers Psychiatry: Flat affect    Data Reviewed: I have personally reviewed following labs and imaging studies  CBC: Recent Labs  Lab 11/25/18 1458  WBC 19.1*  NEUTROABS 15.7*   HGB 12.7*  HCT 38.1*  MCV 92.0  PLT [id number]   Basic Metabolic Panel: Recent Labs  Lab 11/25/18 1458 11/25/18 1959 11/26/18 0121 11/26/18 0537 11/26/18 0852  NA 122* 125* 129* 127* 125*  K 3.2* 3.1* 2.8* 3.3* 3.5  CL 59* 65* 71* 69* 68*  CO2 20* 24 26 20* 17*  GLUCOSE 518* 169* 99 369* 445*  BUN 120* 94* 124* 120* 120*  CREATININE 13.84* 14.37* 13.99* 14.01* 14.42*  CALCIUM 7.7* 7.2* 7.4* 7.4* 7.1*  MG 2.8*  --   --   --  2.5*   GFR: Estimated Creatinine Clearance: 8 mL/min (A) (by C-G formula based on SCr of 14.42 mg/dL (H)). Liver Function Tests: Recent Labs  Lab 11/25/18 1458  AST 17  ALT 16  ALKPHOS 120  BILITOT 2.0*  PROT 8.0  ALBUMIN 4.4   Recent Labs  Lab 11/25/18 1458  LIPASE 17   No results for input(s): AMMONIA in the last 168 hours. Coagulation Profile: No results for input(s): INR, PROTIME in the last 168 hours. Cardiac Enzymes: No results for input(s): CKTOTAL, CKMB, CKMBINDEX, TROPONINI in the last 168 hours. BNP (last 3 results) No results for input(s): PROBNP in the last 8760 hours. HbA1C: No results for input(s): HGBA1C in the last 72 hours. CBG: Recent Labs  Lab 11/26/18 0204 11/26/18 0313 11/26/18 0515 11/26/18 0801 11/26/18 0932  GLUCAP 94 117* 342* 450* 416*   Lipid Profile: No results for input(s): CHOL, HDL, LDLCALC, TRIG, CHOLHDL, LDLDIRECT in the last 72 hours. Thyroid Function Tests: No results for input(s): TSH, T4TOTAL, FREET4, T3FREE, THYROIDAB in the last 72 hours. Anemia Panel: No results for input(s): VITAMINB12, FOLATE, FERRITIN, TIBC, IRON, RETICCTPCT in the last 72 hours. Sepsis Labs: No results for input(s): PROCALCITON, LATICACIDVEN in the last 168 hours.  Recent Results (from the past 240 hour(s))  SARS Coronavirus 2 by RT PCR (hospital order, performed in [facility] hospital lab) Nasopharyngeal Nasopharyngeal Swab     Status: None   Collection Time: 11/25/18  2:44 PM   Specimen: Nasopharyngeal Swab  Result  Value Ref Range Status   SARS Coronavirus 2 NEGATIVE NEGATIVE Final    Comment: (NOTE) If result is NEGATIVE SARS-CoV-2 target nucleic acids are NOT DETECTED. The SARS-CoV-2 RNA is generally detectable in upper and lower  respiratory specimens during the acute phase of infection. The lowest  concentration of SARS-CoV-2 viral copies this assay can detect is 250  copies / mL. A negative result does not preclude SARS-CoV-2 infection  and should not be used as the sole basis for treatment or other  patient management decisions.  A negative result may occur with  improper specimen collection / handling, submission of specimen other  than nasopharyngeal swab, presence of viral mutation(s) within the  areas targeted by this assay, and inadequate number of viral copies  (<250 copies / mL). A negative result must be combined with clinical  observations, patient history, and epidemiological information. If result is POSITIVE SARS-CoV-2 target nucleic acids are DETECTED. The SARS-CoV-2 RNA is generally detectable in upper and lower  respiratory specimens dur ing the acute phase of infection.  Positive  results are indicative of active infection with SARS-CoV-2.  Clinical  correlation with patient history and other diagnostic information is  necessary to determine patient infection status.  Positive results do  not rule out bacterial infection or co-infection with other viruses. If result is PRESUMPTIVE POSTIVE SARS-CoV-2 nucleic acids MAY BE PRESENT.   A presumptive positive result was obtained on the submitted specimen  and confirmed on repeat testing.  While 2019 novel coronavirus  (SARS-CoV-2) nucleic acids may be present in the submitted sample  additional confirmatory testing may be necessary for epidemiological  and / or clinical management purposes  to differentiate between  SARS-CoV-2 and other Sarbecovirus currently known to infect humans.  If clinically indicated additional testing  with an alternate test  methodology [phone]) is advised. The SARS-CoV-2 RNA is generally  detectable in upper and lower respiratory sp ecimens during the acute  phase of infection. The expected result is Negative. Fact Sheet for Patients:  StrictlyIdeas.no Fact Sheet for Healthcare Providers: BankingDealers.co.za This test is not yet approved or cleared by the [place] FDA and has been authorized for detection and/or diagnosis of SARS-CoV-2 by FDA under an Emergency Use Authorization (EUA).  This EUA will remain in effect (meaning this test can be used) for the duration of the COVID-19 declaration under Section 564(b)(1) of the Act, 21 U.S.C. section 360bbb-3(b)(1), unless the authorization is terminated or revoked sooner. Performed at [facility], [address], [place], [place] [address]          Radiology Studies: Dg Chest Portable 1 View  Result Date: 11/25/2018 CLINICAL DATA:  Shortness of breath. Missed 4 dialysis treatments due to inability to obtain a COVID-19 test. EXAM: PORTABLE CHEST 1 VIEW COMPARISON:  07/18/2018 FINDINGS: Enlarged cardiac silhouette with an interval increase in size. Mild increase in prominence of the pulmonary vasculature and interstitial markings. No pleural fluid. IMPRESSION: Interval cardiomegaly with interval mild changes of congestive heart failure. Electronically Signed   By: Claudie Revering M.D.   On: 11/25/2018 16:28        Scheduled Meds: . Chlorhexidine Gluconate Cloth  6 each Topical Q0600  . heparin  5,000 Units Subcutaneous Q8H  . insulin regular  0-10 Units Intravenous TID WC  . labetalol  200 mg Oral BID  . ondansetron (ZOFRAN) IV  4 mg Intravenous Once  . pantoprazole (PROTONIX) IV  40 mg Intravenous Q24H   Continuous Infusions: . sodium chloride    . sodium chloride    . sodium chloride 10 mL/hr at 11/26/18 0837  . dextrose 5 % and 0.45% NaCl    . insulin 0.5 Units/hr  (11/26/18 0827)     LOS: 1 day    Time spent: 30 minutes    [place] Darleen Crocker, DO [facility]  If 7PM-7AM, please contact night-coverage [email or web address] 11/26/2018, 10:28 AM

## 2018-11-26 NOTE — Procedures (Signed)
    HEMODIALYSIS TREATMENT NOTE:  Catheter completely occluded when dialysis was attempted this morning.  Activase instilled and dwelled while another patient was dialyzed.  Cathflo fully withdrawn and catheter limbs flushed without resistance at 1710.  Treatment was completed without problems.  All blood was returned.  Total run time: 3 hours No fluid removed; at dry weight 79.5kg BMP scheduled for 2000 NOT collected.  Next BMP at 2400.  Rockwell Alexandria, RN

## 2018-11-26 NOTE — ED Notes (Signed)
ED TO INPATIENT HANDOFF REPORT  ED Nurse Name and Phone #: [phone]  S Name/Age/Gender Brent Daniels 33 y.o. male Room/Bed: APA12/APA12  Code Status   Code Status: Full Code  Home/SNF/Other Home Patient oriented to: self Is this baseline? Yes   Triage Complete: Triage complete  Chief Complaint Hyperglycemia  Triage Note Patient has missed 4 dialysis treatments due to inability to obtain a covid test. Patient's current CBG is over 600.    Allergies Allergies  Allergen Reactions  . Hydrocodone Bitartrate Er Anaphylaxis    Anaphylaxis Can take plain Tylenol  . Tramadol Anaphylaxis, Nausea Only and Other (See Comments)    Acid Reflux  . Ibuprofen Nausea And Vomiting and Other (See Comments)    Stomach ulcers  . Naproxen Other (See Comments)    Stomach ulcers  . Sulfa Antibiotics Rash and Other (See Comments)    Stomach ulcers   . Ketorolac Other (See Comments)    Unknown  . Nsaids Nausea And Vomiting    Level of Care/Admitting Diagnosis ED Disposition    ED Disposition Condition [place] [facility]: [facility] [id number]  Level of Care: Stepdown [14]  Covid Evaluation: Asymptomatic Screening Protocol (No Symptoms)  Diagnosis: DKA (diabetic ketoacidoses) [facility]) [id number]  Admitting Physician: Kara Pacer  Attending Physician: Bethena Roys [phone]  Estimated length of stay: past midnight tomorrow  Certification:: I certify this patient will need inpatient services for at least 2 midnights  PT Class (Do Not Modify): Inpatient [101]  PT Acc Code (Do Not Modify): Private [1]       B Medical/Surgery History Past Medical History:  Diagnosis Date  . Acute esophagitis   . Anemia   . Anxiety   . Arthritis   . Bicuspid aortic valve    noted on 10/20/16 [facility])  . Chronic abdominal pain   . Chronic back pain   . Depression   . Diabetes mellitus    type 1  . Diabetes mellitus without complication ([place])   .  Diabetic gastroparesis associated with type 1 diabetes mellitus ([place])   . Erosive esophagitis 12/23/2013   "severe" per EGD   . ESRD (end stage renal disease) on dialysis ([place])   . Gastroparesis   . GERD (gastroesophageal reflux disease)   . Headache    in the past  . Heart murmur    mild TR, mild MR, mild AS 02/19/17  . History of hiatal hernia   . Hypercholesteremia   . Hypertension   . Insomnia   . Nausea and vomiting    chronic, recurrent  . Neuropathy   . Pneumonia   . PUD (peptic ulcer disease)   . Renal disorder   . S/P arthroscopic knee surgery 07/04/2011  . Scoliosis 07/11/2012  . Seizure [facility])    Past Surgical History:  Procedure Laterality Date  . AV FISTULA PLACEMENT Right 04/09/2017   Procedure: ARTERIOVENOUS (AV) FISTULA CREATION RIGHT ARM;  Surgeon: Rosetta Posner, MD;  Location: [place];  Service: Vascular;  Laterality: Right;  . BASCILIC VEIN TRANSPOSITION Left 09/27/2018   Procedure: BASILIC VEIN TRANSPOSITION Left Arm;  Surgeon: Angelia Mould, MD;  Location: [place];  Service: Vascular;  Laterality: Left;  . BIOPSY  06/17/2013   Procedure: GASTRIC ULCER AND ANTRAL BIOPSIES; ESOPHAGEAL BRUSHING;  Surgeon: Danie Binder, MD;  Location: [facility];  Service: Endoscopy;;  . BIOPSY N/A 12/23/2013   Procedure: ESOPHAGEAL BIOPSY;  Surgeon: Daneil Dolin, MD;  Location: [facility];  Service: Endoscopy;  Laterality: N/A;  . CHOLECYSTECTOMY N/A 01/28/2014   Procedure: LAPAROSCOPIC CHOLECYSTECTOMY;  Surgeon: Jamesetta So, MD;  Location: [facility];  Service: General;  Laterality: N/A;  . ESOPHAGOGASTRODUODENOSCOPY (EGD) WITH PROPOFOL  06/17/2013   Dr. Oneida Alar: two gastric ulcers in fundus, mild antral gastritis, candida esophagitis  . ESOPHAGOGASTRODUODENOSCOPY (EGD) WITH PROPOFOL N/A 09/11/2013   Dr. [name]: abnormal hypopharynx, question massively enlarged tonsils, stomach full of food precluded exam, needs EGD for verification of ulcer healing at a later date  .  ESOPHAGOGASTRODUODENOSCOPY (EGD) WITH PROPOFOL N/A 12/23/2013   RMR: Severe exudative esophagitis likely predominatly reflux related. Superimposed Candida infection not excluded status post KOH brushing and biopsy. Localized excoriating gastric mucosa most consistant with trauma(vomiting). No evidence of peptic ulcer disease or other gastric/duodenal pathology. I suspect severe inflammation involving the distal esophagus may account  for at least some of patii  . EYE SURGERY    . INCISION AND DRAINAGE ABSCESS N/A 10/19/2017   Procedure: INCISION AND DRAINAGE ABSCESS SCROTAL EXPLORATION,WASHOUT, [place] OF TISSUE;  Surgeon: Ardis Hughs, MD;  Location: [place];  Service: Urology;  Laterality: N/A;  . IR FLUORO GUIDE CV LINE RIGHT  05/03/2018  . IR US GUIDE VASC ACCESS RIGHT  05/03/2018  . IRRIGATION AND DEBRIDEMENT ABSCESS N/A 10/21/2017   Procedure: IRRIGATION AND DEBRIDEMENT ABSCESS;  Surgeon: Ardis Hughs, MD;  Location: [place];  Service: Urology;  Laterality: N/A;  . KNEE ARTHROSCOPY  06/30/2011   Procedure: ARTHROSCOPY KNEE;  Surgeon: Carole Civil, MD;  Location: [facility];  Service: Orthopedics;  Laterality: Right;  diagnostic arthroscopy  . TYMPANOSTOMY TUBE PLACEMENT    . WISDOM TOOTH EXTRACTION       A IV Location/Drains/Wounds Patient Lines/Drains/Airways Status   Active Line/Drains/Airways    Name:   Placement date:   Placement time:   Site:   Days:   Peripheral IV 11/25/18 Right Antecubital   11/25/18    1428    Antecubital   1   Fistula / Graft Right Forearm Arteriovenous fistula   04/09/17    0808    Forearm   596   Fistula / Graft Left Upper arm Arteriovenous fistula   09/27/18    1255    Upper arm   60   Hemodialysis Catheter Right Internal jugular Double-lumen;Permanent   05/03/18    1549    Internal jugular   207   Hemodialysis Catheter Right Subclavian   -    -    Subclavian      Hemodialysis Catheter Right Subclavian   -    -    Subclavian      Incision  (Closed) 10/19/17 Scrotum Other (Comment)   10/19/17    2338     403   Incision (Closed) 10/21/17 Scrotum   10/21/17    0859     401   Incision (Closed) 05/03/18 Chest Right   05/03/18    1605     207   Incision (Closed) 09/27/18 Arm Left   09/27/18    1226     60   Wound / Incision (Open or Dehisced) 06/27/18 Laceration Back Right;Lower 7 cm long   06/27/18    0110    Back   152          Intake/Output Last 24 hours  Intake/Output Summary (Last 24 hours) at 11/26/2018 0647 Last data filed at 11/26/2018 0109 Gross per 24 hour  Intake 753.78 ml  Output -  Net 753.78 ml    Labs/Imaging Results for orders placed or performed during the hospital encounter of 11/25/18 (from the past 48 hour(s))  CBG monitoring, ED     Status: Abnormal   Collection Time: 11/25/18  2:03 PM  Result Value Ref Range   Glucose-Capillary >600 (HH) 70 - 99 mg/dL  SARS Coronavirus 2 by RT PCR (hospital order, performed in [facility] hospital lab) Nasopharyngeal Nasopharyngeal Swab     Status: None   Collection Time: 11/25/18  2:44 PM   Specimen: Nasopharyngeal Swab  Result Value Ref Range   SARS Coronavirus 2 NEGATIVE NEGATIVE    Comment: (NOTE) If result is NEGATIVE SARS-CoV-2 target nucleic acids are NOT DETECTED. The SARS-CoV-2 RNA is generally detectable in upper and lower  respiratory specimens during the acute phase of infection. The lowest  concentration of SARS-CoV-2 viral copies this assay can detect is 250  copies / mL. A negative result does not preclude SARS-CoV-2 infection  and should not be used as the sole basis for treatment or other  patient management decisions.  A negative result may occur with  improper specimen collection / handling, submission of specimen other  than nasopharyngeal swab, presence of viral mutation(s) within the  areas targeted by this assay, and inadequate number of viral copies  (<250 copies / mL). A negative result must be combined with clinical  observations,  patient history, and epidemiological information. If result is POSITIVE SARS-CoV-2 target nucleic acids are DETECTED. The SARS-CoV-2 RNA is generally detectable in upper and lower  respiratory specimens dur ing the acute phase of infection.  Positive  results are indicative of active infection with SARS-CoV-2.  Clinical  correlation with patient history and other diagnostic information is  necessary to determine patient infection status.  Positive results do  not rule out bacterial infection or co-infection with other viruses. If result is PRESUMPTIVE POSTIVE SARS-CoV-2 nucleic acids MAY BE PRESENT.   A presumptive positive result was obtained on the submitted specimen  and confirmed on repeat testing.  While 2019 novel coronavirus  (SARS-CoV-2) nucleic acids may be present in the submitted sample  additional confirmatory testing may be necessary for epidemiological  and / or clinical management purposes  to differentiate between  SARS-CoV-2 and other Sarbecovirus currently known to infect humans.  If clinically indicated additional testing with an alternate test  methodology [phone]) is advised. The SARS-CoV-2 RNA is generally  detectable in upper and lower respiratory sp ecimens during the acute  phase of infection. The expected result is Negative. Fact Sheet for Patients:  StrictlyIdeas.no Fact Sheet for Healthcare Providers: BankingDealers.co.za This test is not yet approved or cleared by the [place] FDA and has been authorized for detection and/or diagnosis of SARS-CoV-2 by FDA under an Emergency Use Authorization (EUA).  This EUA will remain in effect (meaning this test can be used) for the duration of the COVID-19 declaration under Section 564(b)(1) of the Act, 21 U.S.C. section 360bbb-3(b)(1), unless the authorization is terminated or revoked sooner. Performed at [facility], [address], [place], [place] [address]    CBC with Differential/Platelet     Status: Abnormal   Collection Time: 11/25/18  2:58 PM  Result Value Ref Range   WBC 19.1 (H) 4.0 - 10.5 K/uL   RBC 4.14 (L) 4.22 - 5.81 MIL/uL   Hemoglobin 12.7 (L) 13.0 - 17.0 g/dL   HCT 38.1 (L) 39.0 - 52.0 %   MCV 92.0 80.0 - 100.0 fL  MCH 30.7 26.0 - 34.0 pg   MCHC 33.3 30.0 - 36.0 g/dL   RDW 14.5 11.5 - 15.5 %   Platelets 374 150 - 400 K/uL   nRBC 0.0 0.0 - 0.2 %   Neutrophils Relative % 83 %   Neutro Abs 15.7 (H) 1.7 - 7.7 K/uL   Lymphocytes Relative 11 %   Lymphs Abs 2.1 0.7 - 4.0 K/uL   Monocytes Relative 6 %   Monocytes Absolute 1.2 (H) 0.1 - 1.0 K/uL   Eosinophils Relative 0 %   Eosinophils Absolute 0.0 0.0 - 0.5 K/uL   Basophils Relative 0 %   Basophils Absolute 0.1 0.0 - 0.1 K/uL   Immature Granulocytes 0 %   Abs Immature Granulocytes 0.08 (H) 0.00 - 0.07 K/uL    Comment: Performed at [facility], [address] [place], [place] [address]  Comprehensive metabolic panel     Status: Abnormal   Collection Time: 11/25/18  2:58 PM  Result Value Ref Range   Sodium 122 (L) 135 - 145 mmol/L   Potassium 3.2 (L) 3.5 - 5.1 mmol/L   Chloride 59 (LL) 98 - 111 mmol/L    Comment: CRITICAL RESULT CALLED TO, READ BACK BY AND VERIFIED WITH: MINTER,R ON 11/25/18 AT 1615 BY LOY,C    CO2 20 (L) 22 - 32 mmol/L   Glucose, Bld 518 (HH) 70 - 99 mg/dL    Comment: CRITICAL RESULT CALLED TO, READ BACK BY AND VERIFIED WITH: MINTER,R ON 11/25/18 AT 1615 BY LOY,C    BUN 120 (H) 6 - 20 mg/dL    Comment: RESULTS CONFIRMED BY MANUAL DILUTION   Creatinine, Ser 13.84 (H) 0.61 - 1.24 mg/dL   Calcium 7.7 (L) 8.9 - 10.3 mg/dL   Total Protein 8.0 6.5 - 8.1 g/dL   Albumin 4.4 3.5 - 5.0 g/dL   AST 17 15 - 41 U/L   ALT 16 0 - 44 U/L   Alkaline Phosphatase 120 38 - 126 U/L   Total Bilirubin 2.0 (H) 0.3 - 1.2 mg/dL   GFR calc non Af Amer 4 (L) >60 mL/min   GFR calc Af Amer 5 (L) >60 mL/min   Anion gap >20 (H) 5 - 15    Comment: Performed at [facility], [address], [place], [place] [address]  Magnesium     Status: Abnormal   Collection Time: 11/25/18  2:58 PM  Result Value Ref Range   Magnesium 2.8 (H) 1.7 - 2.4 mg/dL    Comment: Performed at [facility], [address] [place], [place] [address]  Blood gas, venous     Status: Abnormal   Collection Time: 11/25/18  2:58 PM  Result Value Ref Range   FIO2 21.00    pH, Ven 7.402 7.250 - 7.430   pCO2, Ven 36.2 (L) 44.0 - 60.0 mmHg   pO2, Ven 139.0 (H) 32.0 - 45.0 mmHg   Bicarbonate 23.0 20.0 - 28.0 mmol/L   Acid-base deficit 2.0 0.0 - 2.0 mmol/L   O2 Saturation 98.5 %   Patient temperature 36.7     Comment: Performed at [facility], [address], [place], [place] [address]  Lipase, blood     Status: None   Collection Time: 11/25/18  2:58 PM  Result Value Ref Range   Lipase 17 11 - 51 U/L    Comment: Performed at [facility], [address], [place], [place] [address]  CBG monitoring, ED     Status: Abnormal   Collection Time:  11/25/18  4:18 PM  Result Value Ref Range   Glucose-Capillary 344 (H) 70 - 99 mg/dL  CBG monitoring, ED     Status: Abnormal   Collection Time: 11/25/18  5:30 PM  Result Value Ref Range   Glucose-Capillary 265 (H) 70 - 99 mg/dL  CBG monitoring, ED     Status: Abnormal   Collection Time: 11/25/18  6:45 PM  Result Value Ref Range   Glucose-Capillary 190 (H) 70 - 99 mg/dL  CBG monitoring, ED     Status: Abnormal   Collection Time: 11/25/18  7:46 PM  Result Value Ref Range   Glucose-Capillary 173 (H) 70 - 99 mg/dL  Basic metabolic panel     Status: Abnormal   Collection Time: 11/25/18  7:59 PM  Result Value Ref Range   Sodium 125 (L) 135 - 145 mmol/L   Potassium 3.1 (L) 3.5 - 5.1 mmol/L   Chloride 65 (L) 98 - 111 mmol/L   CO2 24 22 - 32 mmol/L   Glucose, Bld 169 (H) 70 - 99 mg/dL   BUN 94 (H) 6 - 20 mg/dL    Comment: RESULTS CONFIRMED BY MANUAL DILUTION   Creatinine, Ser 14.37 (H) 0.61 - 1.24 mg/dL   Calcium 7.2 (L) 8.9 - 10.3 mg/dL   GFR calc  non Af Amer 4 (L) >60 mL/min   GFR calc Af Amer 4 (L) >60 mL/min   Anion gap >20 (H) 5 - 15    Comment: Performed at [facility], [address] [place], [place] [address]  CBG monitoring, ED     Status: Abnormal   Collection Time: 11/25/18  8:43 PM  Result Value Ref Range   Glucose-Capillary 174 (H) 70 - 99 mg/dL  CBG monitoring, ED     Status: Abnormal   Collection Time: 11/25/18  9:53 PM  Result Value Ref Range   Glucose-Capillary 207 (H) 70 - 99 mg/dL  CBG monitoring, ED     Status: Abnormal   Collection Time: 11/25/18 10:42 PM  Result Value Ref Range   Glucose-Capillary 187 (H) 70 - 99 mg/dL  CBG monitoring, ED     Status: Abnormal   Collection Time: 11/25/18 11:49 PM  Result Value Ref Range   Glucose-Capillary 166 (H) 70 - 99 mg/dL  CBG monitoring, ED     Status: Abnormal   Collection Time: 11/26/18  1:05 AM  Result Value Ref Range   Glucose-Capillary 121 (H) 70 - 99 mg/dL  Basic metabolic panel     Status: Abnormal   Collection Time: 11/26/18  1:21 AM  Result Value Ref Range   Sodium 129 (L) 135 - 145 mmol/L   Potassium 2.8 (L) 3.5 - 5.1 mmol/L   Chloride 71 (L) 98 - 111 mmol/L   CO2 26 22 - 32 mmol/L   Glucose, Bld 99 70 - 99 mg/dL   BUN 124 (H) 6 - 20 mg/dL    Comment: RESULTS CONFIRMED BY MANUAL DILUTION   Creatinine, Ser 13.99 (H) 0.61 - 1.24 mg/dL   Calcium 7.4 (L) 8.9 - 10.3 mg/dL   GFR calc non Af Amer 4 (L) >60 mL/min   GFR calc Af Amer 5 (L) >60 mL/min   Anion gap >20 (H) 5 - 15    Comment: Performed at [facility], [address], [place], [place] [address]  CBG monitoring, ED     Status: None   Collection Time: 11/26/18  2:04 AM  Result Value Ref Range   Glucose-Capillary 94 70 -  99 mg/dL  CBG monitoring, ED     Status: Abnormal   Collection Time: 11/26/18  3:13 AM  Result Value Ref Range   Glucose-Capillary 117 (H) 70 - 99 mg/dL  CBG monitoring, ED     Status: Abnormal   Collection Time: 11/26/18  5:15 AM  Result Value Ref Range    Glucose-Capillary 342 (H) 70 - 99 mg/dL  Basic metabolic panel     Status: Abnormal   Collection Time: 11/26/18  5:37 AM  Result Value Ref Range   Sodium 127 (L) 135 - 145 mmol/L   Potassium 3.3 (L) 3.5 - 5.1 mmol/L   Chloride 69 (L) 98 - 111 mmol/L   CO2 20 (L) 22 - 32 mmol/L   Glucose, Bld 369 (H) 70 - 99 mg/dL   BUN 120 (H) 6 - 20 mg/dL    Comment: RESULTS CONFIRMED BY MANUAL DILUTION   Creatinine, Ser 14.01 (H) 0.61 - 1.24 mg/dL   Calcium 7.4 (L) 8.9 - 10.3 mg/dL   GFR calc non Af Amer 4 (L) >60 mL/min   GFR calc Af Amer 5 (L) >60 mL/min    Comment: Performed at [facility], [address], [place], [place] [address]   Dg Chest Portable 1 View  Result Date: 11/25/2018 CLINICAL DATA:  Shortness of breath. Missed 4 dialysis treatments due to inability to obtain a COVID-19 test. EXAM: PORTABLE CHEST 1 VIEW COMPARISON:  07/18/2018 FINDINGS: Enlarged cardiac silhouette with an interval increase in size. Mild increase in prominence of the pulmonary vasculature and interstitial markings. No pleural fluid. IMPRESSION: Interval cardiomegaly with interval mild changes of congestive heart failure. Electronically Signed   By: Claudie Revering M.D.   On: 11/25/2018 16:28    Pending Labs Unresulted Labs (From admission, onward)    Start     Ordered   11/25/18 1438  Urinalysis, Routine w reflex microscopic  Once,   STAT     11/25/18 1440   Signed and Held  Renal function panel  Once,   R     Signed and Held   Signed and Held  CBC  Once,   R     Signed and Held          Vitals/Pain Today's Vitals   11/26/18 0430 11/26/18 0530 11/26/18 0600 11/26/18 0630  BP: (!) 107/47 111/73 123/70 122/69  Pulse:  (!) 102 99 98  Resp:      Temp:      TempSrc:      SpO2:  99% 99% 99%  Weight:      Height:      PainSc:        Isolation Precautions No active isolations  Medications Medications  ondansetron (ZOFRAN) injection 4 mg (4 mg Intravenous Not Given 11/25/18 1535)  labetalol (NORMODYNE)  tablet 200 mg (has no administration in time range)  pantoprazole (PROTONIX) injection 40 mg (40 mg Intravenous Not Given 11/25/18 2121)  heparin injection 5,000 Units (5,000 Units Subcutaneous Not Given 11/26/18 0502)  Chlorhexidine Gluconate Cloth 2 % PADS 6 each (6 each Topical Not Given 11/26/18 0511)  pentafluoroprop-tetrafluoroeth (GEBAUERS) aerosol 1 application (has no administration in time range)  lidocaine (PF) (XYLOCAINE) 1 % injection 5 mL (has no administration in time range)  lidocaine-prilocaine (EMLA) cream 1 application (has no administration in time range)  0.9 %  sodium chloride infusion (has no administration in time range)  0.9 %  sodium chloride infusion (has no administration in time range)  insulin aspart (novoLOG)  injection 0-9 Units (has no administration in time range)  insulin aspart (novoLOG) injection 0-5 Units (has no administration in time range)  insulin glargine (LANTUS) injection 10 Units (10 Units Subcutaneous Given 11/26/18 0543)  fentaNYL (SUBLIMAZE) injection 50 mcg (50 mcg Intravenous Given 11/25/18 1504)  ondansetron (ZOFRAN) injection 4 mg (4 mg Intravenous Given 11/25/18 1504)  potassium chloride 10 mEq in 100 mL IVPB (0 mEq Intravenous Stopped 11/25/18 2331)  0.9 %  sodium chloride infusion ( Intravenous New Bag/Given 11/26/18 0332)    Mobility walks with device Low fall risk   Focused Assessments    R Recommendations: See Admitting Provider Note  Report given to:   Additional Notes:

## 2018-11-26 NOTE — Consult Note (Signed)
Referring Provider: No ref. provider found Primary Care Physician:  Glenda Chroman, MD Primary Nephrologist:  Dr. Holley Raring  Reason for Consultation: Management of end-stage renal disease, maintenance of euvolemia, assessment and treatment of anemia, assessment and treatment of secondary hyperparathyroidism.  HPI: This is a 33 year old gentleman with a history of diabetes mellitus type 1, gastroesophageal reflux disease end-stage renal disease Tuesday Thursday Saturday dialysis [facility].  Frequently missing dialysis apparently has missed about 4 with dialysis sessions.  History of cocaine polysubstance abuse.  He was admitted with a blood sugar over 600 diabetic ketoacidosis.  Blood pressure 140/75 pulse 96 temperature 97.8 O2 sats 100% room air  Sodium 127 potassium 3.3 chloride 89 CO2 22 glucose 369 BUN 120 creatinine 14 calcium 7.4 WBC 19.1 hemoglobin 12.7 platelets 374  Home medications per record  Cymbalta 30 mg twice daily, Pepcid 20 mg twice daily, Lasix 40 mg daily, labetalol 200 mg 3 times daily, lisinopril 2.5 mg daily, Levemir 20 units twice daily, Reglan 5 mg 4 times daily Protonix 40 mg daily Zanaflex 4 mg every 6 hours promethazine 25 mg every 6 hours  Chest x-ray and large cardiac silhouette with mild changes of congestive heart failure  Last 2D echo EF 55 to 65% 07/02/2018   Past Medical History:  Diagnosis Date  . Acute esophagitis   . Anemia   . Anxiety   . Arthritis   . Bicuspid aortic valve    noted on 10/20/16 [facility])  . Chronic abdominal pain   . Chronic back pain   . Depression   . Diabetes mellitus    type 1  . Diabetes mellitus without complication ([place])   . Diabetic gastroparesis associated with type 1 diabetes mellitus ([place])   . Erosive esophagitis 12/23/2013   "severe" per EGD   . ESRD (end stage renal disease) on dialysis ([place])   . Gastroparesis   . GERD (gastroesophageal reflux disease)   . Headache    in the past  . Heart murmur    mild TR,  mild MR, mild AS 02/19/17  . History of hiatal hernia   . Hypercholesteremia   . Hypertension   . Insomnia   . Nausea and vomiting    chronic, recurrent  . Neuropathy   . Pneumonia   . PUD (peptic ulcer disease)   . Renal disorder   . S/P arthroscopic knee surgery 07/04/2011  . Scoliosis 07/11/2012  . Seizure [facility])     Past Surgical History:  Procedure Laterality Date  . AV FISTULA PLACEMENT Right 04/09/2017   Procedure: ARTERIOVENOUS (AV) FISTULA CREATION RIGHT ARM;  Surgeon: Rosetta Posner, MD;  Location: [place];  Service: Vascular;  Laterality: Right;  . BASCILIC VEIN TRANSPOSITION Left 09/27/2018   Procedure: BASILIC VEIN TRANSPOSITION Left Arm;  Surgeon: Angelia Mould, MD;  Location: [place];  Service: Vascular;  Laterality: Left;  . BIOPSY  06/17/2013   Procedure: GASTRIC ULCER AND ANTRAL BIOPSIES; ESOPHAGEAL BRUSHING;  Surgeon: Danie Binder, MD;  Location: [facility];  Service: Endoscopy;;  . BIOPSY N/A 12/23/2013   Procedure: ESOPHAGEAL BIOPSY;  Surgeon: Daneil Dolin, MD;  Location: [facility];  Service: Endoscopy;  Laterality: N/A;  . CHOLECYSTECTOMY N/A 01/28/2014   Procedure: LAPAROSCOPIC CHOLECYSTECTOMY;  Surgeon: Jamesetta So, MD;  Location: [facility];  Service: General;  Laterality: N/A;  . ESOPHAGOGASTRODUODENOSCOPY (EGD) WITH PROPOFOL  06/17/2013   Dr. Oneida Alar: two gastric ulcers in fundus, mild antral gastritis, candida esophagitis  . ESOPHAGOGASTRODUODENOSCOPY (EGD) WITH  PROPOFOL N/A 09/11/2013   Dr. [name]: abnormal hypopharynx, question massively enlarged tonsils, stomach full of food precluded exam, needs EGD for verification of ulcer healing at a later date  . ESOPHAGOGASTRODUODENOSCOPY (EGD) WITH PROPOFOL N/A 12/23/2013   RMR: Severe exudative esophagitis likely predominatly reflux related. Superimposed Candida infection not excluded status post KOH brushing and biopsy. Localized excoriating gastric mucosa most consistant with trauma(vomiting). No evidence of peptic ulcer  disease or other gastric/duodenal pathology. I suspect severe inflammation involving the distal esophagus may account  for at least some of patii  . EYE SURGERY    . INCISION AND DRAINAGE ABSCESS N/A 10/19/2017   Procedure: INCISION AND DRAINAGE ABSCESS SCROTAL EXPLORATION,WASHOUT, [place] OF TISSUE;  Surgeon: Ardis Hughs, MD;  Location: [place];  Service: Urology;  Laterality: N/A;  . IR FLUORO GUIDE CV LINE RIGHT  05/03/2018  . IR US GUIDE VASC ACCESS RIGHT  05/03/2018  . IRRIGATION AND DEBRIDEMENT ABSCESS N/A 10/21/2017   Procedure: IRRIGATION AND DEBRIDEMENT ABSCESS;  Surgeon: Ardis Hughs, MD;  Location: [place];  Service: Urology;  Laterality: N/A;  . KNEE ARTHROSCOPY  06/30/2011   Procedure: ARTHROSCOPY KNEE;  Surgeon: Carole Civil, MD;  Location: [facility];  Service: Orthopedics;  Laterality: Right;  diagnostic arthroscopy  . TYMPANOSTOMY TUBE PLACEMENT    . WISDOM TOOTH EXTRACTION      Prior to Admission medications   Medication Sig Start Date End Date Taking? Authorizing Provider  Buprenorphine HCl-Naloxone HCl 8-2 MG FILM Place 1 Film under the tongue 3 (three) times daily. 06/14/18  Yes [provider]  dicyclomine (BENTYL) 10 MG capsule Take 10 mg by mouth 4 (four) times daily as needed for spasms (abdominal pain).   Yes [provider]  DULoxetine (CYMBALTA) 30 MG capsule Take 30 mg by mouth 2 (two) times daily. 05/15/18  Yes [provider]  famotidine (PEPCID) 20 MG tablet Take 20 mg by mouth 2 (two) times daily.   Yes [provider]  furosemide (LASIX) 40 MG tablet Take 40 mg by mouth daily.  09/13/17  Yes [provider]  insulin aspart (NOVOLOG FLEXPEN) 100 UNIT/ML FlexPen Inject 5-10 Units into the skin 3 (three) times daily with meals. Per sliding scale   Yes [provider]  labetalol (NORMODYNE) 200 MG tablet Take 1 tablet (200 mg total) by mouth 3 (three) times daily. Patient taking differently: Take 200 mg  by mouth 2 (two) times daily.  08/21/18  Yes Regalado, Belkys A, MD  LEVEMIR FLEXTOUCH 100 UNIT/ML Pen Inject 20 units Twice a day. Patient taking differently: Inject 20 Units into the skin 2 (two) times daily.  08/21/18  Yes Regalado, Belkys A, MD  lisinopril (ZESTRIL) 2.5 MG tablet Take 2.5 mg by mouth daily.   Yes [provider]  Metoclopramide HCl 5 MG TBDP Take 5 mg by mouth 4 (four) times daily -  before meals and at bedtime.    Yes [provider]  ondansetron (ZOFRAN) 4 MG tablet Take 4 mg by mouth every 6 (six) hours as needed for nausea or vomiting.   Yes [provider]  pantoprazole (PROTONIX) 40 MG tablet Take 40 mg by mouth daily.   Yes [provider]  promethazine (PHENERGAN) 25 MG tablet Take 25 mg by mouth every 6 (six) hours as needed for nausea or vomiting.  09/15/17  Yes [provider]  tiZANidine (ZANAFLEX) 4 MG tablet Take 4 mg by mouth every 6 (six) hours as needed for  muscle spasms.    Yes [provider]    Current Facility-Administered Medications  Medication Dose Route Frequency Provider Last Rate Last Dose  . 0.9 %  sodium chloride infusion  100 mL Intravenous PRN Madelon Lips, MD      . 0.9 %  sodium chloride infusion  100 mL Intravenous PRN Madelon Lips, MD      . 0.9 %  sodium chloride infusion   Intravenous Continuous Heath Lark D, DO 10 mL/hr at 11/26/18 [id number]    . Chlorhexidine Gluconate Cloth 2 % PADS 6 each  6 each Topical Q0600 Madelon Lips, MD      . dextrose 5 %-0.45 % sodium chloride infusion   Intravenous Continuous Shah, Pratik D, DO      . dextrose 50 % solution 25 mL  25 mL Intravenous PRN Manuella Ghazi, Pratik D, DO      . heparin injection 5,000 Units  5,000 Units Subcutaneous Q8H Emokpae, Ejiroghene E, MD      . insulin regular bolus via infusion 0-10 Units  0-10 Units Intravenous TID WC Shah, Pratik D, DO      . insulin regular, human (MYXREDLIN) 100 units/ 100 mL infusion   Intravenous  Continuous Shah, Pratik D, DO 0.5 mL/hr at 11/26/18 0827 0.5 Units/hr at 11/26/18 0827  . labetalol (NORMODYNE) tablet 200 mg  200 mg Oral BID Emokpae, Ejiroghene E, MD      . lidocaine (PF) (XYLOCAINE) 1 % injection 5 mL  5 mL Intradermal PRN Madelon Lips, MD      . lidocaine-prilocaine (EMLA) cream 1 application  1 application Topical PRN Madelon Lips, MD      . ondansetron [facility]) injection 4 mg  4 mg Intravenous Once Emokpae, Ejiroghene E, MD      . pantoprazole (PROTONIX) injection 40 mg  40 mg Intravenous Q24H Emokpae, Ejiroghene E, MD      . pentafluoroprop-tetrafluoroeth (GEBAUERS) aerosol 1 application  1 application Topical PRN Madelon Lips, MD        Allergies as of 11/25/2018 - Review Complete 11/25/2018  Allergen Reaction Noted  . Hydrocodone bitartrate er Anaphylaxis 10/16/2017  . Tramadol Anaphylaxis, Nausea Only, and Other (See Comments) 10/14/2013  . Ibuprofen Nausea And Vomiting and Other (See Comments) 08/14/2011  . Naproxen Other (See Comments) 08/14/2011  . Sulfa antibiotics Rash and Other (See Comments) 08/14/2011  . Ketorolac Other (See Comments)   . Nsaids Nausea And Vomiting 05/10/2012    Family History  Problem Relation Age of Onset  . Cancer Father   . Alcohol abuse Father   . Diabetes Father   . Diabetes Paternal Grandfather   . Pseudochol deficiency Neg Hx   . Malignant hyperthermia Neg Hx   . Hypotension Neg Hx   . Anesthesia problems Neg Hx   . Colon cancer Neg Hx     Social History   Socioeconomic History  . Marital status: Single    Spouse name: Not on file  . Number of children: Not on file  . Years of education: 41  . Highest education level: Not on file  Occupational History  . Occupation: unemployed    Fish farm manager: UNEMPLOYED    Comment: trying to get disability  Social Needs  . Financial resource strain: Not on file  . Food insecurity    Worry: Not on file    Inability: Not on file  . Transportation needs    Medical:  Not on file    Non-medical: Not on file  Tobacco  Use  . Smoking status: Current Every Day Smoker    Packs/day: 1.00    Years: 20.00    Pack years: 20.00    Types: Cigarettes  . Smokeless tobacco: Never Used  Substance and Sexual Activity  . Alcohol use: No  . Drug use: No  . Sexual activity: Not Currently  Lifestyle  . Physical activity    Days per week: Not on file    Minutes per session: Not on file  . Stress: Not on file  Relationships  . Social Herbalist on phone: Not on file    Gets together: Not on file    Attends religious service: Not on file    Active member of club or organization: Not on file    Attends meetings of clubs or organizations: Not on file    Relationship status: Not on file  . Intimate partner violence    Fear of current or ex partner: Not on file    Emotionally abused: Not on file    Physically abused: Not on file    Forced sexual activity: Not on file  Other Topics Concern  . Not on file  Social History Narrative   ** Merged History Encounter **        Review of Systems: Unable to obtain  Physical Exam: Vital signs in last 24 hours: Temp:  [97.8 F (36.6 C)-98.1 F (36.7 C)] 97.8 F (36.6 C) (10/20 0749) Pulse Rate:  [93-109] 93 (10/20 0749) Resp:  [12-18] 16 (10/20 0749) BP: (107-141)/(47-93) 139/72 (10/20 0749) SpO2:  [99 %-100 %] 99 % (10/20 0749) Weight:  [79.5 kg-83.9 kg] 79.5 kg (10/20 0749)   General: Chronically ill-appearing gentleman no obvious distress Head:  Normocephalic and atraumatic. Eyes:  Sclera clear, no icterus.   Conjunctiva pink. Ears:  Normal auditory acuity. Nose:  No deformity, discharge,  or lesions. Mouth:  No deformity or lesions, dentition normal. Neck:  Supple; no masses or thyromegaly. JVP not elevated Lungs:  Clear throughout to auscultation.   No wheezes, crackles, or rhonchi. No acute distress. Heart:  Regular rate and rhythm; no murmurs, clicks, rubs,  or gallops. Abdomen:  Soft,  nontender and nondistended. No masses, hepatosplenomegaly or hernias noted. Normal bowel sounds, without guarding, and without rebound.   Msk:  Symmetrical without gross deformities. Normal posture. Pulses:  No carotid, renal, femoral bruits. DP and PT symmetrical and equal Extremities:  Without clubbing or edema.  AV fistula right   right IJ cath Neurologic: Somnolent.  Nonverbal Skin:  Intact without significant lesions or rashes.    Intake/Output from previous day: 10/19 0701 - 10/20 0700 In: 753.8 [I.V.:494.2; IV Piggyback:259.6] Out: -  Intake/Output this shift: Total I/O In: 1.5 [I.V.:1.5] Out: -   Lab Results: Recent Labs    11/25/18 1458  WBC 19.1*  HGB 12.7*  HCT 38.1*  PLT 374   BMET Recent Labs    11/25/18 1959 11/26/18 0121 11/26/18 0537  NA 125* 129* 127*  K 3.1* 2.8* 3.3*  CL 65* 71* 69*  CO2 24 26 20*  GLUCOSE 169* 99 369*  BUN 94* 124* 120*  CREATININE 14.37* 13.99* 14.01*  CALCIUM 7.2* 7.4* 7.4*   LFT Recent Labs    11/25/18 1458  PROT 8.0  ALBUMIN 4.4  AST 17  ALT 16  ALKPHOS 120  BILITOT 2.0*   PT/INR No results for input(s): LABPROT, INR in the last 72 hours. Hepatitis Panel No results for input(s): HEPBSAG, HCVAB, [place], HEPBIGM  in the last 72 hours.  Studies/Results: Dg Chest Portable 1 View  Result Date: 11/25/2018 CLINICAL DATA:  Shortness of breath. Missed 4 dialysis treatments due to inability to obtain a COVID-19 test. EXAM: PORTABLE CHEST 1 VIEW COMPARISON:  07/18/2018 FINDINGS: Enlarged cardiac silhouette with an interval increase in size. Mild increase in prominence of the pulmonary vasculature and interstitial markings. No pleural fluid. IMPRESSION: Interval cardiomegaly with interval mild changes of congestive heart failure. Electronically Signed   By: Claudie Revering M.D.   On: 11/25/2018 16:28   [facility] [place] TTS edw 77kg, time 4 hrs, BFR 350, DFR 600, 2K/2.5 Ca, RIJ TDC, heparin 800 units bolus,  400units/hr  Assessment/Plan:  End-stage renal disease Tuesday Thursday Saturday dialysis.  Will schedule dialysis treatment 11/26/2018.  Stressed importance of compliance with this dialysis regimen  Hypertension/volume will continue dialysis to dry weight.  Has prescription for labetalol twice daily for blood pressure control.  Anemia does not seem to be an issue at this time  Diabetes mellitus as per primary service  Polysubstance abuse will need cessation program  Hypokalemia.  Will follow dialyze added K bath  Hyponatremia slight secondary to noncompliance and excessive free water intake.  Should improve with dialysis  Gastroparesis continues on Reglan and Phenergan  Chronic low back pain was followed by pain management clinic and on Suboxone    LOS: Bryce @TODAY @8 :37 AM

## 2018-11-27 ENCOUNTER — Encounter (HOSPITAL_COMMUNITY): Payer: Medicare Other

## 2018-11-27 LAB — BASIC METABOLIC PANEL
Anion gap: 14 (ref 5–15)
Anion gap: 18 — ABNORMAL HIGH (ref 5–15)
Anion gap: 18 — ABNORMAL HIGH (ref 5–15)
Anion gap: 15 (ref 5–15)
Anion gap: 16 — ABNORMAL HIGH (ref 5–15)
Anion gap: 15 (ref 5–15)
BUN: 50 mg/dL — ABNORMAL HIGH (ref 6–20)
BUN: 52 mg/dL — ABNORMAL HIGH (ref 6–20)
BUN: 51 mg/dL — ABNORMAL HIGH (ref 6–20)
BUN: 53 mg/dL — ABNORMAL HIGH (ref 6–20)
BUN: 52 mg/dL — ABNORMAL HIGH (ref 6–20)
BUN: 52 mg/dL — ABNORMAL HIGH (ref 6–20)
CO2: 24 mmol/L (ref 22–32)
CO2: 23 mmol/L (ref 22–32)
CO2: 25 mmol/L (ref 22–32)
CO2: 27 mmol/L (ref 22–32)
CO2: 26 mmol/L (ref 22–32)
CO2: 26 mmol/L (ref 22–32)
Calcium: 8.1 mg/dL — ABNORMAL LOW (ref 8.9–10.3)
Calcium: 8.9 mg/dL (ref 8.9–10.3)
Calcium: 9 mg/dL (ref 8.9–10.3)
Calcium: 9.4 mg/dL (ref 8.9–10.3)
Calcium: 9.4 mg/dL (ref 8.9–10.3)
Calcium: 9.1 mg/dL (ref 8.9–10.3)
Chloride: 90 mmol/L — ABNORMAL LOW (ref 98–111)
Chloride: 92 mmol/L — ABNORMAL LOW (ref 98–111)
Chloride: 89 mmol/L — ABNORMAL LOW (ref 98–111)
Chloride: 91 mmol/L — ABNORMAL LOW (ref 98–111)
Chloride: 90 mmol/L — ABNORMAL LOW (ref 98–111)
Chloride: 92 mmol/L — ABNORMAL LOW (ref 98–111)
Creatinine, Ser: 7.82 mg/dL — ABNORMAL HIGH (ref 0.61–1.24)
Creatinine, Ser: 7.99 mg/dL — ABNORMAL HIGH (ref 0.61–1.24)
Creatinine, Ser: 8.19 mg/dL — ABNORMAL HIGH (ref 0.61–1.24)
Creatinine, Ser: 8.35 mg/dL — ABNORMAL HIGH (ref 0.61–1.24)
Creatinine, Ser: 8.58 mg/dL — ABNORMAL HIGH (ref 0.61–1.24)
Creatinine, Ser: 8.23 mg/dL — ABNORMAL HIGH (ref 0.61–1.24)
GFR calc Af Amer: 9 mL/min — ABNORMAL LOW (ref >60)
GFR calc Af Amer: 9 mL/min — ABNORMAL LOW (ref >60)
GFR calc Af Amer: 9 mL/min — ABNORMAL LOW (ref >60)
GFR calc Af Amer: 9 mL/min — ABNORMAL LOW (ref >60)
GFR calc Af Amer: 8 mL/min — ABNORMAL LOW (ref >60)
GFR calc Af Amer: 9 mL/min — ABNORMAL LOW (ref >60)
GFR calc non Af Amer: 8 mL/min — ABNORMAL LOW (ref >60)
GFR calc non Af Amer: 8 mL/min — ABNORMAL LOW (ref >60)
GFR calc non Af Amer: 8 mL/min — ABNORMAL LOW (ref >60)
GFR calc non Af Amer: 7 mL/min — ABNORMAL LOW (ref >60)
GFR calc non Af Amer: 7 mL/min — ABNORMAL LOW (ref >60)
GFR calc non Af Amer: 8 mL/min — ABNORMAL LOW (ref >60)
Glucose, Bld: 201 mg/dL — ABNORMAL HIGH (ref 70–99)
Glucose, Bld: 115 mg/dL — ABNORMAL HIGH (ref 70–99)
Glucose, Bld: 208 mg/dL — ABNORMAL HIGH (ref 70–99)
Glucose, Bld: 124 mg/dL — ABNORMAL HIGH (ref 70–99)
Glucose, Bld: 141 mg/dL — ABNORMAL HIGH (ref 70–99)
Glucose, Bld: 205 mg/dL — ABNORMAL HIGH (ref 70–99)
Potassium: 3.4 mmol/L — ABNORMAL LOW (ref 3.5–5.1)
Potassium: 3.5 mmol/L (ref 3.5–5.1)
Potassium: 3.2 mmol/L — ABNORMAL LOW (ref 3.5–5.1)
Potassium: 3.3 mmol/L — ABNORMAL LOW (ref 3.5–5.1)
Potassium: 3.1 mmol/L — ABNORMAL LOW (ref 3.5–5.1)
Potassium: 3.4 mmol/L — ABNORMAL LOW (ref 3.5–5.1)
Sodium: 128 mmol/L — ABNORMAL LOW (ref 135–145)
Sodium: 133 mmol/L — ABNORMAL LOW (ref 135–145)
Sodium: 132 mmol/L — ABNORMAL LOW (ref 135–145)
Sodium: 133 mmol/L — ABNORMAL LOW (ref 135–145)
Sodium: 132 mmol/L — ABNORMAL LOW (ref 135–145)
Sodium: 133 mmol/L — ABNORMAL LOW (ref 135–145)

## 2018-11-27 LAB — GLUCOSE, CAPILLARY
Glucose-Capillary: 119 mg/dL — ABNORMAL HIGH (ref 70–99)
Glucose-Capillary: 120 mg/dL — ABNORMAL HIGH (ref 70–99)
Glucose-Capillary: 123 mg/dL — ABNORMAL HIGH (ref 70–99)
Glucose-Capillary: 125 mg/dL — ABNORMAL HIGH (ref 70–99)
Glucose-Capillary: 129 mg/dL — ABNORMAL HIGH (ref 70–99)
Glucose-Capillary: 132 mg/dL — ABNORMAL HIGH (ref 70–99)
Glucose-Capillary: 136 mg/dL — ABNORMAL HIGH (ref 70–99)
Glucose-Capillary: 138 mg/dL — ABNORMAL HIGH (ref 70–99)
Glucose-Capillary: 146 mg/dL — ABNORMAL HIGH (ref 70–99)
Glucose-Capillary: 146 mg/dL — ABNORMAL HIGH (ref 70–99)
Glucose-Capillary: 160 mg/dL — ABNORMAL HIGH (ref 70–99)
Glucose-Capillary: 166 mg/dL — ABNORMAL HIGH (ref 70–99)
Glucose-Capillary: 168 mg/dL — ABNORMAL HIGH (ref 70–99)
Glucose-Capillary: 170 mg/dL — ABNORMAL HIGH (ref 70–99)
Glucose-Capillary: 172 mg/dL — ABNORMAL HIGH (ref 70–99)
Glucose-Capillary: 179 mg/dL — ABNORMAL HIGH (ref 70–99)
Glucose-Capillary: 183 mg/dL — ABNORMAL HIGH (ref 70–99)
Glucose-Capillary: 183 mg/dL — ABNORMAL HIGH (ref 70–99)
Glucose-Capillary: 191 mg/dL — ABNORMAL HIGH (ref 70–99)
Glucose-Capillary: 204 mg/dL — ABNORMAL HIGH (ref 70–99)
Glucose-Capillary: 219 mg/dL — ABNORMAL HIGH (ref 70–99)
Glucose-Capillary: 224 mg/dL — ABNORMAL HIGH (ref 70–99)
Glucose-Capillary: 88 mg/dL (ref 70–99)

## 2018-11-27 LAB — CBC
HCT: 35.1 % — ABNORMAL LOW (ref 39.0–52.0)
Hemoglobin: 11.3 g/dL — ABNORMAL LOW (ref 13.0–17.0)
MCH: 31 pg (ref 26.0–34.0)
MCHC: 32.2 g/dL (ref 30.0–36.0)
MCV: 96.4 fL (ref 80.0–100.0)
Platelets: 225 10*3/uL (ref 150–400)
RBC: 3.64 MIL/uL — ABNORMAL LOW (ref 4.22–5.81)
RDW: 14.6 % (ref 11.5–15.5)
WBC: 7.5 10*3/uL (ref 4.0–10.5)
nRBC: 0 % (ref 0.0–0.2)

## 2018-11-27 LAB — HEPATITIS B SURFACE ANTIGEN: Hepatitis B Surface Ag: NONREACTIVE

## 2018-11-27 LAB — BETA-HYDROXYBUTYRIC ACID
Beta-Hydroxybutyric Acid: 2.9 mmol/L — ABNORMAL HIGH (ref 0.05–0.27)
Beta-Hydroxybutyric Acid: 0.49 mmol/L — ABNORMAL HIGH (ref 0.05–0.27)

## 2018-11-27 MED ORDER — POTASSIUM CHLORIDE CRYS ER 20 MEQ PO TBCR
40.0000 meq | EXTENDED_RELEASE_TABLET | Freq: Once | ORAL | Status: AC
  Administered 2018-11-27: 40 meq via ORAL
  Filled 2018-11-27: qty 2

## 2018-11-27 MED ORDER — POTASSIUM CHLORIDE CRYS ER 20 MEQ PO TBCR: 40.0000 meq | EXTENDED_RELEASE_TABLET | Freq: Three times a day (TID) | ORAL | Status: DC

## 2018-11-27 MED ORDER — CHLORHEXIDINE GLUCONATE CLOTH 2 % EX PADS
6.0000 | MEDICATED_PAD | Freq: Every day | CUTANEOUS | Status: DC
  Administered 2018-11-27: 6 via TOPICAL

## 2018-11-27 MED ORDER — ONDANSETRON HCL 4 MG/2ML IJ SOLN
4.0000 mg | Freq: Four times a day (QID) | INTRAMUSCULAR | Status: DC | PRN
  Administered 2018-11-27 – 2018-11-28 (×2): 4 mg via INTRAVENOUS
  Filled 2018-11-27 (×2): qty 2

## 2018-11-27 NOTE — Care Management Important Message (Signed)
Important Message  Patient Details  Name: Brent Daniels MRN: [id number] Date of Birth: 11-11-1985   Medicare Important Message Given:  Yes     Tommy Medal 11/27/2018, 3:28 PM

## 2018-11-27 NOTE — TOC Initial Note (Signed)
Transition of Care [facility]) - Initial/Assessment Note    Patient Details  Name: Brent Daniels MRN: [id number] Date of Birth: 1985/12/18  Transition of Care [facility]) CM/SW Contact:    Ihor Gully, LCSW Phone Number: 11/27/2018, 4:11 PM  Clinical Narrative:  Admitted for DKA. On HD. Missed last four sessions. From home with parent. Independent PTA. TOC will assess patient when he is awake for his identifiable needs.   indep,Expected Discharge Plan: Home/Self Care Barriers to Discharge: Continued Medical Work up   Patient Goals and CMS Choice        Expected Discharge Plan and Services Expected Discharge Plan: Home/Self Care       Living arrangements for the past 2 months: Single Family Home                                      Prior Living Arrangements/Services Living arrangements for the past 2 months: Single Family Home Lives with:: Parents Patient language and need for interpreter reviewed:: Yes Do you feel safe going back to the place where you live?: Yes      Need for Family Participation in Patient Care: Yes (Comment) Care giver support system in place?: Yes (comment)   Criminal Activity/Legal Involvement Pertinent to Current Situation/Hospitalization: No - Comment as needed  Activities of Daily Living Home Assistive Devices/Equipment: CBG Meter ADL Screening (condition at time of admission) Patient's cognitive ability adequate to safely complete daily activities?: Yes Is the patient deaf or have difficulty hearing?: No Does the patient have difficulty seeing, even when wearing glasses/contacts?: No Does the patient have difficulty concentrating, remembering, or making decisions?: No Patient able to express need for assistance with ADLs?: Yes Does the patient have difficulty dressing or bathing?: No Independently performs ADLs?: Yes (appropriate for developmental age) Does the patient have difficulty walking or climbing stairs?: No Weakness of Legs:  Both Weakness of Arms/Hands: None  Permission Sought/Granted                  Emotional Assessment Appearance:: Appears stated age   Affect (typically observed): Unable to Assess Orientation: : Oriented to Self, Oriented to Place, Oriented to  Time, Oriented to Situation Alcohol / Substance Use: Not Applicable Psych Involvement: No (comment)  Admission diagnosis:  Uremia [N19] Diabetic ketoacidosis without coma associated with type 1 diabetes mellitus ([place]) [E10.10] Patient Active Problem List   Diagnosis Date Noted  . Gastroparesis due to DM ([place]) 08/20/2018  . Nausea and vomiting 08/19/2018  . Uncontrolled hypertension 08/19/2018  . Intractable vomiting   . Acute respiratory failure ([place])   . Poor dentition   . FUO (fever of unknown origin)   . Seizures ([place]) 06/27/2018  . Hypertensive emergency 06/27/2018  . ESRD (end stage renal disease) on dialysis ([place]) 06/27/2018  . Diabetes type 2, uncontrolled ([place]) 06/27/2018  . Encephalopathy acute 06/26/2018  .  02/01/2018  . DKA (diabetic ketoacidosis) ([place]) 10/17/2017  . Epididymitis 10/17/2017  . Sepsis ([place]) 10/16/2017  . Cellulitis 10/16/2017  . Hydrocele 10/16/2017  . Intractable vomiting with nausea 05/30/2017  . Renal failure (ARF), acute on chronic (HCC) 05/30/2017  . Hyperkalemia 05/29/2017  . Current smoker 01/01/2017  . Acute renal failure ([place])   . ATN (acute tubular necrosis) ([place])   . Erosive esophagitis   . Uremia 11/16/2016  . Neuropathy 11/16/2016  . Vitamin D deficiency 11/16/2016  . CKD (chronic kidney disease), stage V ([place]) 11/16/2016  . Acute renal failure superimposed on chronic kidney disease ([place]) 08/27/2016  . Intractable vomiting   . Volume depletion 08/24/2016  . Narcotic withdrawal ([place])  08/24/2016  . Hypochloremic alkalosis 08/24/2016  . Diabetic hyperosmolar non-ketotic state ([place]) 08/24/2016  . Chronic abdominal pain 08/24/2016  . Diabetic gastroparesis ([place]) 08/24/2016  . Uncontrolled type 1 diabetes mellitus with hyperglycemia, with long-term current use of insulin ([place]) 08/24/2016  . Dehydration   . Reflux esophagitis 05/14/2015  . Nausea & vomiting 11/13/2014  . AKI (acute kidney injury) ([place]) 11/13/2014  . Hyperglycemia due to type 1 diabetes mellitus ([place]) 04/24/2014  . RUQ abdominal pain 04/14/2014  . DKA (diabetic ketoacidoses) ([place]) 04/03/2014  . Gastroparesis 03/30/2014  . Drug abuse ([place]) 03/30/2014  . Tobacco use 03/30/2014  . HLD (hyperlipidemia) 03/30/2014  . Oropharyngeal candidiasis   . Metabolic acidosis   . Abdominal fluid collection   . Abdominal pain, acute   . Esophagitis 01/03/2014  . Nausea with vomiting   . Acute esophagitis   . Hyperglycemia 12/20/2013  . Abdominal pain 12/09/2013  . PUD (peptic ulcer disease) 10/29/2013  . Leukocytosis 09/29/2013  . Tachycardia 09/29/2013  . Intractable nausea and vomiting 09/28/2013  . Chronic generalized abdominal pain 09/28/2013  . Diabetic gastroparesis associated with type 1 diabetes mellitus ([place]) 09/28/2013  . Hematemesis 09/28/2013  . Diabetic neuropathy ([place]) 09/28/2013  . Abdominal pain, epigastric 08/19/2013  . Uncontrolled diabetes mellitus ([place]) 06/15/2013  . GERD (gastroesophageal reflux disease) 06/15/2013  . DKA, type 1 ([place]) 06/15/2013  . Essential hypertension, benign 07/11/2012  . Diabetes mellitus with stage 5 chronic kidney disease ([place]) 07/11/2012  . Scoliosis 07/11/2012  . Back pain 07/11/2012  . S/P arthroscopic knee surgery 07/04/2011   PCP:  Glenda Chroman, MD Pharmacy:   [place], [place] [id number] [place] [id number] Phone: [phone] Fax: [phone]     Social Determinants of Health (SDOH) Interventions     Readmission Risk Interventions Readmission Risk Prevention Plan 08/21/2018  Transportation Screening Complete  Medication Review ([place]) Complete  PCP or Specialist appointment within 3-5 days of discharge Complete  HRI or [place] Complete  SW Recovery Care/Counseling Consult Complete  [place] Not Applicable  Some recent data might be hidden

## 2018-11-27 NOTE — Progress Notes (Signed)
PROGRESS NOTE    Brent Daniels  [id number] DOB: Feb 24, 1985 DOA: 11/25/2018 PCP: Glenda Chroman, MD   Brief Narrative:  Per HPI: Brent Daniels a 33 y.o.malewith medical history significant forESRD, DM with gastroparesis, hypertension, seizures, depression, peptic ulcer disease.History is obtained mostly from chart review, with time of my evaluation patient is awake and appears alert, but he is refusing to answer any questions or follow any directions. History is obtained from chart review, EDP.Patient presented to the ED reports that he has missed 4 of his last HD sessions. This was because he was told he was not allowed to come without the negative Covid test. He also reported progressive abdominal discomfort,vomiting and feeling weak.  10/20: Patient was admitted with DKA with likely noncompliance with home insulin.  It appears that he has been noncompliant with his hemodialysis sessions as well.  He has been seen by nephrology with plans for hemodialysis today.  He unfortunately was converted from IV insulin drip to subcutaneous form too quickly and has gone back into DKA.  10/21: Patient is noted to feel quite thirsty this morning no further nausea, vomiting, or abdominal pain noted.  He is still quite anxious.  Plans for further hemodialysis in a.m.  He remains on insulin drip as well as D5 IV.  Assessment & Plan:   Active Problems:   DKA (diabetic ketoacidoses) ([place])   DKA with likely poor compliance at home insulin -Continue D5 fluid as well as insulin drip with potassium replacement as needed -Continue statin beta hydroxybutyrate elevation which will follow in a.m. -Plan to keep n.p.o. for now and monitor BMP every 6 hours -Appreciate diabetes coordinator recommendations  Acute metabolic encephalopathy-multifactorial, improving -Related to DKA as well as missed hemodialysis sessions with uremia -Appreciate nephrology for hemodialysis and continue treatment  for DKA  ESRD on HD TTS -Appreciate nephrology with ultrafiltration performed 10/20 and plans for further hemodialysis on 10/22  Hypokalemia/hyponatremia -Likely related to dehydration with DKA and missed HD sessions -Replete and monitor repeat labs  Hypertension-stable -Hold home Lasix and lisinopril -Continue home labetalol  Prolonged QTC -Likely secondary to electrolyte disturbances and will continue to monitor  History of seizures -Not currently on antiepileptic agents -Monitor closely and consider EEG should mentation not improve  Depression/gastroparesis -Hold Cymbalta given prolonged QTC   DVT prophylaxis: Heparin Code Status: Full Family Communication: None at bedside Disposition Plan: Continue DKA treatment and follow labs closely.  Continue n.p.o. status.  Plans for further hemodialysis in a.m.   Consultants:   Nephrology  Procedures:   None  Antimicrobials:   None  Subjective: Patient seen and evaluated today with complaints of thirst.  He denies any abdominal pain, nausea or vomiting.  He would like to try and eat if possible.  Blood glucose levels remain stable.  Objective: Vitals:   11/27/18 0841 11/27/18 0900 11/27/18 0912 11/27/18 1100  BP:  (!) 128/96 (!) 128/96   Pulse:  88 92   Resp:  12    Temp:    98 F (36.7 C)  TempSrc:    Oral  SpO2: 100% 99%    Weight:      Height:        Intake/Output Summary (Last 24 hours) at 11/27/2018 1207 Last data filed at 11/27/2018 0300 Gross per 24 hour  Intake 764.06 ml  Output 1000 ml  Net -235.94 ml   Filed Weights   11/26/18 0749 11/26/18 1710 11/27/18 0500  Weight: 79.5 kg 79.5 kg 72.8 kg  Examination:  General exam: Appears calm and comfortable  Respiratory system: Clear to auscultation. Respiratory effort normal. Cardiovascular system: S1 & S2 heard, RRR. No JVD, murmurs, rubs, gallops or clicks. No pedal edema. Gastrointestinal system: Abdomen is nondistended, soft and  nontender. No organomegaly or masses felt. Normal bowel sounds heard. Central nervous system: Alert and oriented. No focal neurological deficits. Extremities: Symmetric 5 x 5 power. Skin: No rashes, lesions or ulcers Psychiatry: Judgement and insight appear normal. Mood & affect appropriate.     Data Reviewed: I have personally reviewed following labs and imaging studies  CBC: Recent Labs  Lab 11/25/18 1458 11/27/18 0812  WBC 19.1* 7.5  NEUTROABS 15.7*  --   HGB 12.7* 11.3*  HCT 38.1* 35.1*  MCV 92.0 96.4  PLT 374 [id number]   Basic Metabolic Panel: Recent Labs  Lab 11/25/18 1458  11/26/18 0852 11/26/18 1224 11/26/18 1637 11/26/18 2328 11/27/18 0344 11/27/18 0812  NA 122*   < > 125* 129* 125* 128* 133* 132*  K 3.2*   < > 3.5 3.1* 3.1* 3.4* 3.5 3.2*  CL 59*   < > 68* 72* 71* 90* 92* 89*  CO2 20*   < > 17* 26 28 24 23 25   GLUCOSE 518*   < > 445* 210* 130* 201* 115* 208*  BUN 120*   < > 120* 115* 124* 50* 52* 51*  CREATININE 13.84*   < > 14.42* 14.02* 14.33* 7.82* 7.99* 8.19*  CALCIUM 7.7*   < > 7.1* 7.8* 7.3* 8.1* 8.9 9.0  MG 2.8*  --  2.5*  --   --   --   --   --    < > = values in this interval not displayed.   GFR: Estimated Creatinine Clearance: 13 mL/min (A) (by C-G formula based on SCr of 8.19 mg/dL (H)). Liver Function Tests: Recent Labs  Lab 11/25/18 1458  AST 17  ALT 16  ALKPHOS 120  BILITOT 2.0*  PROT 8.0  ALBUMIN 4.4   Recent Labs  Lab 11/25/18 1458  LIPASE 17   No results for input(s): AMMONIA in the last 168 hours. Coagulation Profile: No results for input(s): INR, PROTIME in the last 168 hours. Cardiac Enzymes: No results for input(s): CKTOTAL, CKMB, CKMBINDEX, TROPONINI in the last 168 hours. BNP (last 3 results) No results for input(s): PROBNP in the last 8760 hours. HbA1C: No results for input(s): HGBA1C in the last 72 hours. CBG: Recent Labs  Lab 11/27/18 0749 11/27/18 0844 11/27/18 0951 11/27/18 1055 11/27/18 1200  GLUCAP 183*  204* 166* 129* 123*   Lipid Profile: No results for input(s): CHOL, HDL, LDLCALC, TRIG, CHOLHDL, LDLDIRECT in the last 72 hours. Thyroid Function Tests: No results for input(s): TSH, T4TOTAL, FREET4, T3FREE, THYROIDAB in the last 72 hours. Anemia Panel: No results for input(s): VITAMINB12, FOLATE, FERRITIN, TIBC, IRON, RETICCTPCT in the last 72 hours. Sepsis Labs: No results for input(s): PROCALCITON, LATICACIDVEN in the last 168 hours.  Recent Results (from the past 240 hour(s))  SARS Coronavirus 2 by RT PCR (hospital order, performed in [facility] hospital lab) Nasopharyngeal Nasopharyngeal Swab     Status: None   Collection Time: 11/25/18  2:44 PM   Specimen: Nasopharyngeal Swab  Result Value Ref Range Status   SARS Coronavirus 2 NEGATIVE NEGATIVE Final    Comment: (NOTE) If result is NEGATIVE SARS-CoV-2 target nucleic acids are NOT DETECTED. The SARS-CoV-2 RNA is generally detectable in upper and lower  respiratory specimens during the acute phase of  infection. The lowest  concentration of SARS-CoV-2 viral copies this assay can detect is 250  copies / mL. A negative result does not preclude SARS-CoV-2 infection  and should not be used as the sole basis for treatment or other  patient management decisions.  A negative result may occur with  improper specimen collection / handling, submission of specimen other  than nasopharyngeal swab, presence of viral mutation(s) within the  areas targeted by this assay, and inadequate number of viral copies  (<250 copies / mL). A negative result must be combined with clinical  observations, patient history, and epidemiological information. If result is POSITIVE SARS-CoV-2 target nucleic acids are DETECTED. The SARS-CoV-2 RNA is generally detectable in upper and lower  respiratory specimens dur ing the acute phase of infection.  Positive  results are indicative of active infection with SARS-CoV-2.  Clinical  correlation with patient history  and other diagnostic information is  necessary to determine patient infection status.  Positive results do  not rule out bacterial infection or co-infection with other viruses. If result is PRESUMPTIVE POSTIVE SARS-CoV-2 nucleic acids MAY BE PRESENT.   A presumptive positive result was obtained on the submitted specimen  and confirmed on repeat testing.  While 2019 novel coronavirus  (SARS-CoV-2) nucleic acids may be present in the submitted sample  additional confirmatory testing may be necessary for epidemiological  and / or clinical management purposes  to differentiate between  SARS-CoV-2 and other Sarbecovirus currently known to infect humans.  If clinically indicated additional testing with an alternate test  methodology [phone]) is advised. The SARS-CoV-2 RNA is generally  detectable in upper and lower respiratory sp ecimens during the acute  phase of infection. The expected result is Negative. Fact Sheet for Patients:  StrictlyIdeas.no Fact Sheet for Healthcare Providers: BankingDealers.co.za This test is not yet approved or cleared by the [place] FDA and has been authorized for detection and/or diagnosis of SARS-CoV-2 by FDA under an Emergency Use Authorization (EUA).  This EUA will remain in effect (meaning this test can be used) for the duration of the COVID-19 declaration under Section 564(b)(1) of the Act, 21 U.S.C. section 360bbb-3(b)(1), unless the authorization is terminated or revoked sooner. Performed at [facility], [address], [place], [place] [address]   MRSA PCR Screening     Status: None   Collection Time: 11/26/18  7:47 AM   Specimen: Nasopharyngeal  Result Value Ref Range Status   MRSA by PCR NEGATIVE NEGATIVE Final    Comment:        The GeneXpert MRSA Assay (FDA approved for NASAL specimens only), is one component of a comprehensive MRSA colonization surveillance program. It is not intended to  diagnose MRSA infection nor to guide or monitor treatment for MRSA infections. Performed at [facility], [address], [place], [place] [address]          Radiology Studies: Dg Chest Portable 1 View  Result Date: 11/25/2018 CLINICAL DATA:  Shortness of breath. Missed 4 dialysis treatments due to inability to obtain a COVID-19 test. EXAM: PORTABLE CHEST 1 VIEW COMPARISON:  07/18/2018 FINDINGS: Enlarged cardiac silhouette with an interval increase in size. Mild increase in prominence of the pulmonary vasculature and interstitial markings. No pleural fluid. IMPRESSION: Interval cardiomegaly with interval mild changes of congestive heart failure. Electronically Signed   By: Claudie Revering M.D.   On: 11/25/2018 16:28        Scheduled Meds: . buprenorphine-naloxone  1 tablet Sublingual TID  . Chlorhexidine Gluconate  Cloth  6 each Topical [id number]  . Chlorhexidine Gluconate Cloth  6 each Topical Q0600  . heparin  5,000 Units Subcutaneous Q8H  . insulin regular  0-10 Units Intravenous TID WC  . labetalol  200 mg Oral BID  . lidocaine  1 patch Transdermal Q24H  . ondansetron (ZOFRAN) IV  4 mg Intravenous Once  . pantoprazole (PROTONIX) IV  40 mg Intravenous Q24H   Continuous Infusions: . sodium chloride    . sodium chloride    . sodium chloride Stopped (11/26/18 1237)  . dextrose 5 % and 0.45% NaCl 50 mL/hr at 11/27/18 0919  . insulin 0.5 Units/hr (11/26/18 2141)     LOS: 2 days    Time spent: 30 minutes    Mckensi Redinger Darleen Crocker, DO [facility] Pager [phone]  If 7PM-7AM, please contact night-coverage [email or web address] Password [email or web address] 11/27/2018, 12:07 PM

## 2018-11-27 NOTE — Progress Notes (Signed)
[facility] NEPHROLOGY PROGRESS NOTE  Assessment/ Plan: Pt is a 33 y.o. yo male with type I DM, acid reflux, ESRD on HD TTS at [facility] admitted with DKA.  Patient has history of missing dialysis frequently.  #Diabetic ketoacidosis: Improving, IV fluid and insulin per primary team.  # ESRD: TTS at [facility].  Status post HD yesterday with no net fluid removal.  Required Cathflo.  Plan for next treatment tomorrow.  # Anemia: Acceptable.  Continue to monitor.  # Secondary hyperparathyroidism: Check phosphorus level.  He is currently n.p.o.  # HTN/volume: Blood pressure acceptable.  Volume status looks good.  Subjective: Seen and examined at bedside.  Denies headache, dizziness, nausea vomiting chest pain or shortness of breath.  Feeling thirsty.  Had dialysis yesterday, initially problem with catheter which required Cathflo. Objective Vital signs in last 24 hours: Vitals:   11/27/18 0600 11/27/18 0700 11/27/18 0735 11/27/18 0800  BP: 132/81 139/88  (!) 142/90  Pulse: 90 82  83  Resp: 19 16  18   Temp:      TempSrc:   Oral   SpO2: 98% 100%  100%  Weight:      Height:       Weight change: -4.415 kg  Intake/Output Summary (Last 24 hours) at 11/27/2018 0827 Last data filed at 11/27/2018 0300 Gross per 24 hour  Intake 764.06 ml  Output 1000 ml  Net -235.94 ml       Labs: Basic Metabolic Panel: Recent Labs  Lab 11/26/18 1637 11/26/18 2328 11/27/18 0344  NA 125* 128* 133*  K 3.1* 3.4* 3.5  CL 71* 90* 92*  CO2 28 24 23   GLUCOSE 130* 201* 115*  BUN 124* 50* 52*  CREATININE 14.33* 7.82* 7.99*  CALCIUM 7.3* 8.1* 8.9   Liver Function Tests: Recent Labs  Lab 11/25/18 1458  AST 17  ALT 16  ALKPHOS 120  BILITOT 2.0*  PROT 8.0  ALBUMIN 4.4   Recent Labs  Lab 11/25/18 1458  LIPASE 17   No results for input(s): AMMONIA in the last 168 hours. CBC: Recent Labs  Lab 11/25/18 1458  WBC 19.1*  NEUTROABS 15.7*  HGB 12.7*  HCT 38.1*  MCV  92.0  PLT 374   Cardiac Enzymes: No results for input(s): CKTOTAL, CKMB, CKMBINDEX, TROPONINI in the last 168 hours. CBG: Recent Labs  Lab 11/27/18 0352 11/27/18 0443 11/27/18 0554 11/27/18 0647 11/27/18 0749  GLUCAP 138* 160* 170* 179* 183*    Iron Studies: No results for input(s): IRON, TIBC, TRANSFERRIN, FERRITIN in the last 72 hours. Studies/Results: Dg Chest Portable 1 View  Result Date: 11/25/2018 CLINICAL DATA:  Shortness of breath. Missed 4 dialysis treatments due to inability to obtain a COVID-19 test. EXAM: PORTABLE CHEST 1 VIEW COMPARISON:  07/18/2018 FINDINGS: Enlarged cardiac silhouette with an interval increase in size. Mild increase in prominence of the pulmonary vasculature and interstitial markings. No pleural fluid. IMPRESSION: Interval cardiomegaly with interval mild changes of congestive heart failure. Electronically Signed   By: Claudie Revering M.D.   On: 11/25/2018 16:28    Medications: Infusions: . sodium chloride    . sodium chloride    . sodium chloride Stopped (11/26/18 1237)  . dextrose 5 % and 0.45% NaCl 50 mL/hr at 11/27/18 0300  . insulin 0.5 Units/hr (11/26/18 2141)    Scheduled Medications: . buprenorphine-naloxone  1 tablet Sublingual TID  . Chlorhexidine Gluconate Cloth  6 each Topical Q0600  . heparin  5,000 Units Subcutaneous Q8H  .  insulin regular  0-10 Units Intravenous TID WC  . labetalol  200 mg Oral BID  . lidocaine  1 patch Transdermal Q24H  . ondansetron (ZOFRAN) IV  4 mg Intravenous Once  . pantoprazole (PROTONIX) IV  40 mg Intravenous Q24H    have reviewed scheduled and prn medications.  Physical Exam: General:NAD, comfortable Heart:RRR, s1s2 nl Lungs:clear b/l, no crackle Abdomen:soft, Non-tender, non-distended Extremities:No edema Dialysis Access: Right TDC, site clean.  Left upper extremity AV fistula has good thrill and bruit.  Daja Shuping Tanna Furry 11/27/2018,8:27 AM  LOS: 2 days  Pager: [phone]

## 2018-11-27 NOTE — Progress Notes (Addendum)
Inpatient Diabetes Program Recommendations  AACE/ADA: New Consensus Statement on Inpatient Glycemic Control (2015)  Target Ranges:  Prepandial:   less than 140 mg/dL      Peak postprandial:   less than 180 mg/dL (1-2 hours)      Critically ill patients:  140 - 180 mg/dL   Lab Results  Component Value Date   GLUCAP 166 (H) 11/27/2018   HGBA1C 8.8 (H) 06/27/2018    Review of Glycemic Control  Diabetes history: DM 1 Outpatient Diabetes medications: Levemir 20 units bid, Novolog 5-10 tid Current orders for Inpatient glycemic control: IV insulin gtt per DKA protocol  Inpatient Diabetes Program Recommendations:    Glucose >600 on presentation. IV insulin initially. IV insulin stopped before Levemir given. Tried to transition on Levemir 10 units. Glucose increased >400. IV insulin restarted.  Pt has seen Dr. Dwyane Dee (Endocrinology) in the past for DM.   CO2 normal, Glucose normal. Anion Gap 18 (however this can be related to renal failure) Consider transitioning this am.  At time of transition consider starting patient's home dose of Levemir 20 units bid and overlap it 2 hours with IV insulin.  Also Consider Novolog 0-9 units tid + hs scale.  Addendum 1200 pm:  Pplan of care discussed between Dr. Manuella Ghazi and DM Coordinator on call. Beta Hydroxybutyric acid still elevated at 2.9, indicating DKA still. Will watch on IV insulin for today.  Thanks,  Tama Headings RN, MSN, BC-ADM Inpatient Diabetes Coordinator Team Pager [phone] (8a-5p)

## 2018-11-28 LAB — GLUCOSE, CAPILLARY
Glucose-Capillary: 104 mg/dL — ABNORMAL HIGH (ref 70–99)
Glucose-Capillary: 108 mg/dL — ABNORMAL HIGH (ref 70–99)
Glucose-Capillary: 129 mg/dL — ABNORMAL HIGH (ref 70–99)
Glucose-Capillary: 140 mg/dL — ABNORMAL HIGH (ref 70–99)
Glucose-Capillary: 152 mg/dL — ABNORMAL HIGH (ref 70–99)
Glucose-Capillary: 155 mg/dL — ABNORMAL HIGH (ref 70–99)
Glucose-Capillary: 162 mg/dL — ABNORMAL HIGH (ref 70–99)
Glucose-Capillary: 164 mg/dL — ABNORMAL HIGH (ref 70–99)
Glucose-Capillary: 193 mg/dL — ABNORMAL HIGH (ref 70–99)
Glucose-Capillary: 52 mg/dL — ABNORMAL LOW (ref 70–99)
Glucose-Capillary: 88 mg/dL (ref 70–99)
Glucose-Capillary: 95 mg/dL (ref 70–99)
Glucose-Capillary: 97 mg/dL (ref 70–99)

## 2018-11-28 LAB — BASIC METABOLIC PANEL
Anion gap: 13 (ref 5–15)
BUN: 50 mg/dL — ABNORMAL HIGH (ref 6–20)
CO2: 26 mmol/L (ref 22–32)
Calcium: 9.2 mg/dL (ref 8.9–10.3)
Chloride: 94 mmol/L — ABNORMAL LOW (ref 98–111)
Creatinine, Ser: 8.36 mg/dL — ABNORMAL HIGH (ref 0.61–1.24)
GFR calc Af Amer: 9 mL/min — ABNORMAL LOW (ref >60)
GFR calc non Af Amer: 7 mL/min — ABNORMAL LOW (ref >60)
Glucose, Bld: 91 mg/dL (ref 70–99)
Potassium: 3.3 mmol/L — ABNORMAL LOW (ref 3.5–5.1)
Sodium: 133 mmol/L — ABNORMAL LOW (ref 135–145)

## 2018-11-28 LAB — PHOSPHORUS: Phosphorus: 5.2 mg/dL — ABNORMAL HIGH (ref 2.5–4.6)

## 2018-11-28 LAB — MAGNESIUM: Magnesium: 2.2 mg/dL (ref 1.7–2.4)

## 2018-11-28 LAB — CBC
HCT: 35 % — ABNORMAL LOW (ref 39.0–52.0)
Hemoglobin: 11 g/dL — ABNORMAL LOW (ref 13.0–17.0)
MCH: 30.6 pg (ref 26.0–34.0)
MCHC: 31.4 g/dL (ref 30.0–36.0)
MCV: 97.2 fL (ref 80.0–100.0)
Platelets: 206 10*3/uL (ref 150–400)
RBC: 3.6 MIL/uL — ABNORMAL LOW (ref 4.22–5.81)
RDW: 14.4 % (ref 11.5–15.5)
WBC: 8.6 10*3/uL (ref 4.0–10.5)
nRBC: 0 % (ref 0.0–0.2)

## 2018-11-28 LAB — HEMOGLOBIN A1C
Hgb A1c MFr Bld: 9.5 % — ABNORMAL HIGH (ref 4.8–5.6)
Mean Plasma Glucose: 225.95 mg/dL

## 2018-11-28 LAB — BETA-HYDROXYBUTYRIC ACID: Beta-Hydroxybutyric Acid: 0.17 mmol/L (ref 0.05–0.27)

## 2018-11-28 MED ORDER — INSULIN ASPART 100 UNIT/ML ~~LOC~~ SOLN: 0.0000 [IU] | Freq: Every day | SUBCUTANEOUS | Status: DC

## 2018-11-28 MED ORDER — SODIUM CHLORIDE 0.9 % IV SOLN: 100.0000 mL | INTRAVENOUS | Status: DC | PRN

## 2018-11-28 MED ORDER — INSULIN ASPART 100 UNIT/ML FLEXPEN: 5.0000 [IU] | PEN_INJECTOR | Freq: Three times a day (TID) | SUBCUTANEOUS | Status: DC

## 2018-11-28 MED ORDER — INSULIN DETEMIR 100 UNIT/ML FLEXPEN: 20.0000 [IU] | PEN_INJECTOR | Freq: Two times a day (BID) | SUBCUTANEOUS | Status: DC

## 2018-11-28 MED ORDER — INSULIN ASPART 100 UNIT/ML ~~LOC~~ SOLN: 0.0000 [IU] | Freq: Three times a day (TID) | SUBCUTANEOUS | Status: DC

## 2018-11-28 MED ORDER — INSULIN DETEMIR 100 UNIT/ML ~~LOC~~ SOLN
20.0000 [IU] | Freq: Two times a day (BID) | SUBCUTANEOUS | Status: DC
  Administered 2018-11-28: 20 [IU] via SUBCUTANEOUS
  Filled 2018-11-28 (×3): qty 0.2

## 2018-11-28 MED ORDER — HEPARIN SODIUM (PORCINE) 1000 UNIT/ML DIALYSIS
20.0000 [IU]/kg | INTRAMUSCULAR | Status: DC | PRN
  Filled 2018-11-28: qty 2

## 2018-11-28 MED ORDER — INSULIN ASPART 100 UNIT/ML ~~LOC~~ SOLN
5.0000 [IU] | Freq: Three times a day (TID) | SUBCUTANEOUS | Status: DC
  Administered 2018-11-28 (×2): 5 [IU] via SUBCUTANEOUS

## 2018-11-28 MED ORDER — ALTEPLASE 2 MG IJ SOLR: 2.0000 mg | Freq: Once | INTRAMUSCULAR | Status: DC | PRN

## 2018-11-28 NOTE — TOC Transition Note (Signed)
Transition of Care [facility]) - CM/SW Discharge Note   Patient Details  Name: Brent Daniels MRN: [id number] Date of Birth: 05/10/1985  [facility]) CM/SW Contact:  Ihor Gully, LCSW Phone Number: 11/28/2018, 12:41 PM   Clinical Narrative:    Patient states that he is on the TTS dialysis schedule at [facility] in [place]. He states that he is compliant with this medications because he has to take them to live. He states that he and his mother assist each other in staying on schedule taking medications as prescribed. Patient stated "none at all" when asked if there were any barriers to getting medications or taking medications as prescribed. He states that his mother transports him to dialysis and picks up prescriptions for him. Patient is also that he receives SSI and that his mother is on disability. Patient states that he uses [facility] and they deliver his medications if he needs them to do so.  Patient made contradictory statements.  Message left for mother whom patient lives with to gain clarification.    Barriers to Discharge: Continued Medical Work up   Patient Goals and CMS Choice        Discharge Placement                       Discharge Plan and Services                                     Social Determinants of Health (SDOH) Interventions     Readmission Risk Interventions Readmission Risk Prevention Plan 08/21/2018  Transportation Screening Complete  Medication Review [employer]) Complete  PCP or Specialist appointment within 3-5 days of discharge Complete  HRI or [place] Complete  SW Recovery Care/Counseling Consult Complete  [place] Not Applicable  Some recent data might be hidden

## 2018-11-28 NOTE — Progress Notes (Signed)
Discharge instructions given to patient. IV removed, telemetry removed. Pts mother picked up patient from ED parking. Pt wheeled out via w/c by NT

## 2018-11-28 NOTE — Discharge Summary (Signed)
Physician Discharge Summary  Brent Daniels [id number] DOB: 07/28/1985 DOA: 11/25/2018  PCP: Glenda Chroman, MD  Admit date: 11/25/2018  Discharge date: 11/28/2018  Admitted From:Home  Disposition:  Home  Recommendations for Outpatient Follow-up:  1. Follow up with PCP in 1-2 weeks 2. Continue on home insulin regimen as prior 3. Continue on hemodialysis as prior with TTS schedule 4. Continue home medications as prior  Home Health: None  Equipment/Devices: None  Discharge Condition: Stable  CODE STATUS: Full  Diet recommendation: Heart Healthy/carb modified  Brief/Interim Summary: Per HPI: Brent Daniels a 34 y.o.malewith medical history significant forESRD, DM with gastroparesis, hypertension, seizures, depression, peptic ulcer disease.History is obtained mostly from chart review, with time of my evaluation patient is awake and appears alert, but he is refusing to answer any questions or follow any directions. History is obtained from chart review, EDP.Patient presented to the ED reports that he has missed 4 of his last HD sessions. This was because he was told he was not allowed to come without the negative Covid test. He also reported progressive abdominal discomfort,vomiting and feeling weak.  10/20:Patient was admitted with DKA with likely noncompliance with home insulin. It appears that he has been noncompliant with his hemodialysis sessions as well. He has been seen by nephrology with plans for hemodialysis today. He unfortunately was converted from IV insulin drip to subcutaneous form too quickly and has gone back into DKA.  10/21: Patient is noted to feel quite thirsty this morning no further nausea, vomiting, or abdominal pain noted.  He is still quite anxious.  Plans for further hemodialysis in a.m.  He remains on insulin drip as well as D5 IV.  10/22: Patient will be transition from IV insulin to subcutaneous insulin with his prior home regimen.   He is tolerating his diet with no concerns today.  He will receive hemodialysis prior to discharge and will need to remain compliant with his prior hemodialysis regimen.  CSW will evaluate for any home medication or dialysis related concerns prior to discharge.  He is otherwise stable for discharge today after hemodialysis session with no other acute events noted throughout the course of this admission.  Discharge Diagnoses:  Active Problems:   DKA (diabetic ketoacidoses) ([place])  Principal discharge diagnosis: DKA secondary to poor compliance with home insulin as well as missed HD.  Discharge Instructions  Discharge Instructions    Diet - low sodium heart healthy   Complete by: As directed    Increase activity slowly   Complete by: As directed      Allergies as of 11/28/2018      Reactions   Hydrocodone Bitartrate Er Anaphylaxis   Anaphylaxis Can take plain Tylenol   Tramadol Anaphylaxis, Nausea Only, Other (See Comments)   Acid Reflux   Ibuprofen Nausea And Vomiting, Other (See Comments)   Stomach ulcers   Naproxen Other (See Comments)   Stomach ulcers   Sulfa Antibiotics Rash, Other (See Comments)   Stomach ulcers   Ketorolac Other (See Comments)   Unknown   Nsaids Nausea And Vomiting      Medication List    TAKE these medications   Buprenorphine HCl-Naloxone HCl 8-2 MG Film Place 1 Film under the tongue 3 (three) times daily.   dicyclomine 10 MG capsule Commonly known as: BENTYL Take 10 mg by mouth 4 (four) times daily as needed for spasms (abdominal pain).   DULoxetine 30 MG capsule Commonly known as: CYMBALTA Take 30 mg by mouth 2 (  two) times daily.   famotidine 20 MG tablet Commonly known as: PEPCID Take 20 mg by mouth 2 (two) times daily.   furosemide 40 MG tablet Commonly known as: LASIX Take 40 mg by mouth daily.   labetalol 200 MG tablet Commonly known as: NORMODYNE Take 1 tablet (200 mg total) by mouth 3 (three) times daily. What changed: when to  take this   Levemir FlexTouch 100 UNIT/ML Pen Generic drug: Insulin Detemir Inject 20 units Twice a day. What changed:   how much to take  how to take this  when to take this  additional instructions   lisinopril 2.5 MG tablet Commonly known as: ZESTRIL Take 2.5 mg by mouth daily.   Metoclopramide HCl 5 MG Tbdp Take 5 mg by mouth 4 (four) times daily -  before meals and at bedtime.   NovoLOG FlexPen 100 UNIT/ML FlexPen Generic drug: insulin aspart Inject 5-10 Units into the skin 3 (three) times daily with meals. Per sliding scale   ondansetron 4 MG tablet Commonly known as: ZOFRAN Take 4 mg by mouth every 6 (six) hours as needed for nausea or vomiting.   pantoprazole 40 MG tablet Commonly known as: PROTONIX Take 40 mg by mouth daily.   promethazine 25 MG tablet Commonly known as: PHENERGAN Take 25 mg by mouth every 6 (six) hours as needed for nausea or vomiting.   tiZANidine 4 MG tablet Commonly known as: ZANAFLEX Take 4 mg by mouth every 6 (six) hours as needed for muscle spasms.      Follow-up Information    Vyas, Dhruv B, MD Follow up in 1 week(s).   Specialty: Internal Medicine Contact information: [place] [address] [phone]          Allergies  Allergen Reactions  . Hydrocodone Bitartrate Er Anaphylaxis    Anaphylaxis Can take plain Tylenol  . Tramadol Anaphylaxis, Nausea Only and Other (See Comments)    Acid Reflux  . Ibuprofen Nausea And Vomiting and Other (See Comments)    Stomach ulcers  . Naproxen Other (See Comments)    Stomach ulcers  . Sulfa Antibiotics Rash and Other (See Comments)    Stomach ulcers   . Ketorolac Other (See Comments)    Unknown  . Nsaids Nausea And Vomiting    Consultations:  Nephrology   Procedures/Studies: Dg Chest Portable 1 View  Result Date: 11/25/2018 CLINICAL DATA:  Shortness of breath. Missed 4 dialysis treatments due to inability to obtain a COVID-19 test. EXAM: PORTABLE CHEST 1  VIEW COMPARISON:  07/18/2018 FINDINGS: Enlarged cardiac silhouette with an interval increase in size. Mild increase in prominence of the pulmonary vasculature and interstitial markings. No pleural fluid. IMPRESSION: Interval cardiomegaly with interval mild changes of congestive heart failure. Electronically Signed   By: Claudie Revering M.D.   On: 11/25/2018 16:28     Discharge Exam: Vitals:   11/28/18 1100 11/28/18 1146  BP: 114/74   Pulse:    Resp: 13   Temp:  98.1 F (36.7 C)  SpO2:     Vitals:   11/28/18 0900 11/28/18 1000 11/28/18 1100 11/28/18 1146  BP: 118/88 123/83 114/74   Pulse:      Resp: 14 14 13    Temp:    98.1 F (36.7 C)  TempSrc:    Oral  SpO2:      Weight:      Height:        General: Pt is alert, awake, not in acute distress Cardiovascular:  RRR, S1/S2 +, no rubs, no gallops Respiratory: CTA bilaterally, no wheezing, no rhonchi Abdominal: Soft, NT, ND, bowel sounds + Extremities: no edema, no cyanosis    The results of significant diagnostics from this hospitalization (including imaging, microbiology, ancillary and laboratory) are listed below for reference.     Microbiology: Recent Results (from the past 240 hour(s))  SARS Coronavirus 2 by RT PCR (hospital order, performed in [facility] hospital lab) Nasopharyngeal Nasopharyngeal Swab     Status: None   Collection Time: 11/25/18  2:44 PM   Specimen: Nasopharyngeal Swab  Result Value Ref Range Status   SARS Coronavirus 2 NEGATIVE NEGATIVE Final    Comment: (NOTE) If result is NEGATIVE SARS-CoV-2 target nucleic acids are NOT DETECTED. The SARS-CoV-2 RNA is generally detectable in upper and lower  respiratory specimens during the acute phase of infection. The lowest  concentration of SARS-CoV-2 viral copies this assay can detect is 250  copies / mL. A negative result does not preclude SARS-CoV-2 infection  and should not be used as the sole basis for treatment or other  patient management decisions.  A  negative result may occur with  improper specimen collection / handling, submission of specimen other  than nasopharyngeal swab, presence of viral mutation(s) within the  areas targeted by this assay, and inadequate number of viral copies  (<250 copies / mL). A negative result must be combined with clinical  observations, patient history, and epidemiological information. If result is POSITIVE SARS-CoV-2 target nucleic acids are DETECTED. The SARS-CoV-2 RNA is generally detectable in upper and lower  respiratory specimens dur ing the acute phase of infection.  Positive  results are indicative of active infection with SARS-CoV-2.  Clinical  correlation with patient history and other diagnostic information is  necessary to determine patient infection status.  Positive results do  not rule out bacterial infection or co-infection with other viruses. If result is PRESUMPTIVE POSTIVE SARS-CoV-2 nucleic acids MAY BE PRESENT.   A presumptive positive result was obtained on the submitted specimen  and confirmed on repeat testing.  While 2019 novel coronavirus  (SARS-CoV-2) nucleic acids may be present in the submitted sample  additional confirmatory testing may be necessary for epidemiological  and / or clinical management purposes  to differentiate between  SARS-CoV-2 and other Sarbecovirus currently known to infect humans.  If clinically indicated additional testing with an alternate test  methodology [phone]) is advised. The SARS-CoV-2 RNA is generally  detectable in upper and lower respiratory sp ecimens during the acute  phase of infection. The expected result is Negative. Fact Sheet for Patients:  StrictlyIdeas.no Fact Sheet for Healthcare Providers: BankingDealers.co.za This test is not yet approved or cleared by the [place] FDA and has been authorized for detection and/or diagnosis of SARS-CoV-2 by FDA under an Emergency Use  Authorization (EUA).  This EUA will remain in effect (meaning this test can be used) for the duration of the COVID-19 declaration under Section 564(b)(1) of the Act, 21 U.S.C. section 360bbb-3(b)(1), unless the authorization is terminated or revoked sooner. Performed at [facility], [address], [place], [place] [address]   MRSA PCR Screening     Status: None   Collection Time: 11/26/18  7:47 AM   Specimen: Nasopharyngeal  Result Value Ref Range Status   MRSA by PCR NEGATIVE NEGATIVE Final    Comment:        The GeneXpert MRSA Assay (FDA approved for NASAL specimens only), is one component of a comprehensive MRSA colonization  surveillance program. It is not intended to diagnose MRSA infection nor to guide or monitor treatment for MRSA infections. Performed at [facility], [address] [place], [place] [address]      Labs: BNP (last 3 results) No results for input(s): BNP in the last 8760 hours. Basic Metabolic Panel: Recent Labs  Lab 11/25/18 1458  11/26/18 0852  11/27/18 0812 11/27/18 1206 11/27/18 1644 11/27/18 2256 11/28/18 0309  NA 122*   < > 125*   < > 132* 133* 132* 133* 133*  K 3.2*   < > 3.5   < > 3.2* 3.3* 3.1* 3.4* 3.3*  CL 59*   < > 68*   < > 89* 91* 90* 92* 94*  CO2 20*   < > 17*   < > 25 27 26 26 26   GLUCOSE 518*   < > 445*   < > 208* 124* 141* 205* 91  BUN 120*   < > 120*   < > 51* 53* 52* 52* 50*  CREATININE 13.84*   < > 14.42*   < > 8.19* 8.35* 8.58* 8.23* 8.36*  CALCIUM 7.7*   < > 7.1*   < > 9.0 9.4 9.4 9.1 9.2  MG 2.8*  --  2.5*  --   --   --   --   --  2.2  PHOS  --   --   --   --   --   --   --   --  5.2*   < > = values in this interval not displayed.   Liver Function Tests: Recent Labs  Lab 11/25/18 1458  AST 17  ALT 16  ALKPHOS 120  BILITOT 2.0*  PROT 8.0  ALBUMIN 4.4   Recent Labs  Lab 11/25/18 1458  LIPASE 17   No results for input(s): AMMONIA in the last 168 hours. CBC: Recent Labs  Lab 11/25/18 1458 11/27/18 0812  11/28/18 0709  WBC 19.1* 7.5 8.6  NEUTROABS 15.7*  --   --   HGB 12.7* 11.3* 11.0*  HCT 38.1* 35.1* 35.0*  MCV 92.0 96.4 97.2  PLT 374 225 206   Cardiac Enzymes: No results for input(s): CKTOTAL, CKMB, CKMBINDEX, TROPONINI in the last 168 hours. BNP: Invalid input(s): POCBNP CBG: Recent Labs  Lab 11/28/18 0627 11/28/18 0735 11/28/18 0831 11/28/18 0930 11/28/18 1147  GLUCAP 162* 155* 193* 129* 97   D-Dimer No results for input(s): DDIMER in the last 72 hours. Hgb A1c No results for input(s): HGBA1C in the last 72 hours. Lipid Profile No results for input(s): CHOL, HDL, LDLCALC, TRIG, CHOLHDL, LDLDIRECT in the last 72 hours. Thyroid function studies No results for input(s): TSH, T4TOTAL, T3FREE, THYROIDAB in the last 72 hours.  Invalid input(s): FREET3 Anemia work up No results for input(s): VITAMINB12, FOLATE, FERRITIN, TIBC, IRON, RETICCTPCT in the last 72 hours. Urinalysis    Component Value Date/Time   COLORURINE STRAW (A) 06/27/2018 0509   APPEARANCEUR CLEAR 06/27/2018 0509   LABSPEC 1.010 06/27/2018 0509   PHURINE 7.0 06/27/2018 0509   GLUCOSEU >=500 (A) 06/27/2018 0509   HGBUR SMALL (A) 06/27/2018 0509   BILIRUBINUR NEGATIVE 06/27/2018 0509   KETONESUR 5 (A) 06/27/2018 0509   PROTEINUR >=300 (A) 06/27/2018 0509   UROBILINOGEN 1.0 11/13/2014 0155   NITRITE NEGATIVE 06/27/2018 0509   LEUKOCYTESUR NEGATIVE 06/27/2018 0509   Sepsis Labs Invalid input(s): PROCALCITONIN,  WBC,  LACTICIDVEN Microbiology Recent Results (from the past 240 hour(s))  SARS Coronavirus 2 by RT  PCR (hospital order, performed in [facility] hospital lab) Nasopharyngeal Nasopharyngeal Swab     Status: None   Collection Time: 11/25/18  2:44 PM   Specimen: Nasopharyngeal Swab  Result Value Ref Range Status   SARS Coronavirus 2 NEGATIVE NEGATIVE Final    Comment: (NOTE) If result is NEGATIVE SARS-CoV-2 target nucleic acids are NOT DETECTED. The SARS-CoV-2 RNA is generally detectable  in upper and lower  respiratory specimens during the acute phase of infection. The lowest  concentration of SARS-CoV-2 viral copies this assay can detect is 250  copies / mL. A negative result does not preclude SARS-CoV-2 infection  and should not be used as the sole basis for treatment or other  patient management decisions.  A negative result may occur with  improper specimen collection / handling, submission of specimen other  than nasopharyngeal swab, presence of viral mutation(s) within the  areas targeted by this assay, and inadequate number of viral copies  (<250 copies / mL). A negative result must be combined with clinical  observations, patient history, and epidemiological information. If result is POSITIVE SARS-CoV-2 target nucleic acids are DETECTED. The SARS-CoV-2 RNA is generally detectable in upper and lower  respiratory specimens dur ing the acute phase of infection.  Positive  results are indicative of active infection with SARS-CoV-2.  Clinical  correlation with patient history and other diagnostic information is  necessary to determine patient infection status.  Positive results do  not rule out bacterial infection or co-infection with other viruses. If result is PRESUMPTIVE POSTIVE SARS-CoV-2 nucleic acids MAY BE PRESENT.   A presumptive positive result was obtained on the submitted specimen  and confirmed on repeat testing.  While 2019 novel coronavirus  (SARS-CoV-2) nucleic acids may be present in the submitted sample  additional confirmatory testing may be necessary for epidemiological  and / or clinical management purposes  to differentiate between  SARS-CoV-2 and other Sarbecovirus currently known to infect humans.  If clinically indicated additional testing with an alternate test  methodology [phone]) is advised. The SARS-CoV-2 RNA is generally  detectable in upper and lower respiratory sp ecimens during the acute  phase of infection. The expected result is  Negative. Fact Sheet for Patients:  StrictlyIdeas.no Fact Sheet for Healthcare Providers: BankingDealers.co.za This test is not yet approved or cleared by the [place] FDA and has been authorized for detection and/or diagnosis of SARS-CoV-2 by FDA under an Emergency Use Authorization (EUA).  This EUA will remain in effect (meaning this test can be used) for the duration of the COVID-19 declaration under Section 564(b)(1) of the Act, 21 U.S.C. section 360bbb-3(b)(1), unless the authorization is terminated or revoked sooner. Performed at [facility], [address], [place], [place] [address]   MRSA PCR Screening     Status: None   Collection Time: 11/26/18  7:47 AM   Specimen: Nasopharyngeal  Result Value Ref Range Status   MRSA by PCR NEGATIVE NEGATIVE Final    Comment:        The GeneXpert MRSA Assay (FDA approved for NASAL specimens only), is one component of a comprehensive MRSA colonization surveillance program. It is not intended to diagnose MRSA infection nor to guide or monitor treatment for MRSA infections. Performed at [facility], [address] [place], [place] [address]      Time coordinating discharge: 35 minutes  SIGNED:   Rodena Goldmann, DO [facility] 11/28/2018, 11:56 AM  If 7PM-7AM, please contact night-coverage [email or web address]

## 2018-11-28 NOTE — Progress Notes (Signed)
Patient ID: Brent Daniels, male   DOB: 1985/07/16, 33 y.o.   MRN: [id number]  [facility] Progress Note    Subjective:    No complaints   Objective:   BP 116/78   Pulse 90   Temp 98.4 F (36.9 C) (Oral)   Resp (!) 22   Ht 6\' 1"  (1.854 m)   Wt 78.6 kg   SpO2 100%   BMI 22.86 kg/m   Intake/Output: I/O last 3 completed shifts: In: 574.5 [I.V.:574.5] Out: 2100 [Urine:2100]   Intake/Output this shift:  Total I/O In: 1506.4 [I.V.:1506.4] Out: -  Weight change: -0.9 kg  Physical Exam: Gen: chronically ill-appearing WM in NAD CVS: no rub Resp:cta Abd: +BS, soft, NT/ND Ext: no edema  Labs: BMET Recent Labs  Lab 11/25/18 1458  11/26/18 2328 11/27/18 0344 11/27/18 0812 11/27/18 1206 11/27/18 1644 11/27/18 2256 11/28/18 0309  NA 122*   < > 128* 133* 132* 133* 132* 133* 133*  K 3.2*   < > 3.4* 3.5 3.2* 3.3* 3.1* 3.4* 3.3*  CL 59*   < > 90* 92* 89* 91* 90* 92* 94*  CO2 20*   < > 24 23 25 27 26 26 26   GLUCOSE 518*   < > 201* 115* 208* 124* 141* 205* 91  BUN 120*   < > 50* 52* 51* 53* 52* 52* 50*  CREATININE 13.84*   < > 7.82* 7.99* 8.19* 8.35* 8.58* 8.23* 8.36*  ALBUMIN 4.4  --   --   --   --   --   --   --   --   CALCIUM 7.7*   < > 8.1* 8.9 9.0 9.4 9.4 9.1 9.2  PHOS  --   --   --   --   --   --   --   --  5.2*   < > = values in this interval not displayed.   CBC Recent Labs  Lab 11/25/18 1458 11/27/18 0812 11/28/18 0709  WBC 19.1* 7.5 8.6  NEUTROABS 15.7*  --   --   HGB 12.7* 11.3* 11.0*  HCT 38.1* 35.1* 35.0*  MCV 92.0 96.4 97.2  PLT 374 225 206    @IMGRELPRIORS @ Medications:    . buprenorphine-naloxone  1 tablet Sublingual TID  . Chlorhexidine Gluconate Cloth  6 each Topical Q0600  . Chlorhexidine Gluconate Cloth  6 each Topical Q0600  . heparin  5,000 Units Subcutaneous Q8H  . insulin aspart  0-5 Units Subcutaneous QHS  . insulin aspart  0-9 Units Subcutaneous TID WC  . insulin aspart  5 Units Subcutaneous TID WC  . insulin  detemir  20 Units Subcutaneous BID  . labetalol  200 mg Oral BID  . lidocaine  1 patch Transdermal Q24H  . pantoprazole (PROTONIX) IV  40 mg Intravenous Q24H     Assessment/ Plan:   1. DKA- presumably due to noncompliance with home insulin.  Per primary svc 2. ESRD continue with HD qTTS 3. Anemia: stable 4. CKD-MBD:cont outpatient meds and renal diet 5. Nutrition: renal diet/carb modified 6. Hypertension: stable 7. Hypokalemia- will use added K bath today with HD.  8. Vascular access- RIJ TDC with poor bfr last tx s/p activase.  Will need new AVF/AVG as an outpatient.  Donetta Potts, MD [place] Pager [phone] 11/28/2018, 9:29 AM

## 2018-11-28 NOTE — Procedures (Signed)
    HEMODIALYSIS TREATMENT NOTE:  3.5 hour low-heparin dialysis completed via right chest wall tunneled catheter. Kept even / no fluid removed per Dr. Louie Boston order.  All blood was returned.  Rockwell Alexandria, RN

## 2018-12-02 ENCOUNTER — Encounter (HOSPITAL_COMMUNITY): Payer: Medicare Other

## 2018-12-02 ENCOUNTER — Encounter: Payer: Medicare Other | Admitting: Family

## 2018-12-04 ENCOUNTER — Telehealth (HOSPITAL_COMMUNITY): Payer: Self-pay

## 2018-12-04 NOTE — Telephone Encounter (Signed)
Voicemail was left for patient including appointment time and information and instructions regarding COVID-19 safety procedures including mask usage, rescheduling if sympotmatic, and limited visitors in testing area.  

## 2018-12-05 ENCOUNTER — Inpatient Hospital Stay (HOSPITAL_COMMUNITY): Admit: 2018-12-05 | Payer: Medicare Other

## 2018-12-05 ENCOUNTER — Encounter: Payer: Medicare Other | Admitting: Family

## 2018-12-13 ENCOUNTER — Encounter: Payer: Medicare Other | Admitting: Family

## 2018-12-13 ENCOUNTER — Inpatient Hospital Stay (HOSPITAL_COMMUNITY): Admission: RE | Admit: 2018-12-13 | Payer: Medicare Other | Source: Ambulatory Visit

## 2018-12-23 ENCOUNTER — Ambulatory Visit (INDEPENDENT_AMBULATORY_CARE_PROVIDER_SITE_OTHER): Payer: Self-pay | Admitting: Family

## 2018-12-23 ENCOUNTER — Ambulatory Visit (HOSPITAL_COMMUNITY)
Admission: RE | Admit: 2018-12-23 | Discharge: 2018-12-23 | Disposition: A | Discharge status: Home / Self Care | Payer: Medicare Other | Source: Ambulatory Visit | Attending: Vascular Surgery | Admitting: Vascular Surgery

## 2018-12-23 ENCOUNTER — Encounter: Payer: Self-pay | Admitting: Family

## 2018-12-23 ENCOUNTER — Other Ambulatory Visit: Payer: Self-pay

## 2018-12-23 VITALS — BP 147/90 | HR 74 | Temp 97.7°F | Resp 18 | Ht 73.0 in | Wt 192.0 lb

## 2018-12-23 DIAGNOSIS — Z992 Dependence on renal dialysis: Secondary | ICD-10-CM

## 2018-12-23 DIAGNOSIS — N186 End stage renal disease: Secondary | ICD-10-CM | POA: Insufficient documentation

## 2018-12-23 DIAGNOSIS — I770 Arteriovenous fistula, acquired: Secondary | ICD-10-CM

## 2018-12-23 NOTE — Progress Notes (Signed)
CC: 6 weeks post op duplex for AVF  History of Present Illness  Brent Daniels is a 33 y.o. (1985-03-17) male who is s/p left basilic vein transposition on 09-27-18 by Dr. Scot Dock. Previous permanent access was right brachiocephalic AV fistula creation on 04-09-17 by Dr. Donnetta Hutching which eventually occluded.   Pt returns today for 6 weeks post operative duplex of AVF. He denies any steal type symptoms in his left upper extremity, denies fever or chills.   He dialyzes on TTS via right IJ TDC at [facility] in [place].   He is right hand dominant.    Past Medical History:  Diagnosis Date  . Acute esophagitis   . Anemia   . Anxiety   . Arthritis   . Bicuspid aortic valve    noted on 10/20/16 TEE [facility])  . Chronic abdominal pain   . Chronic back pain   . Depression   . Diabetes mellitus    type 1  . Diabetes mellitus without complication ([place])   . Diabetic gastroparesis associated with type 1 diabetes mellitus ([place])   . Erosive esophagitis 12/23/2013   "severe" per EGD   . ESRD (end stage renal disease) on dialysis ([place])   . Gastroparesis   . GERD (gastroesophageal reflux disease)   . Headache    in the past  . Heart murmur    mild TR, mild MR, mild AS 02/19/17  . History of hiatal hernia   . Hypercholesteremia   . Hypertension   . Insomnia   . Nausea and vomiting    chronic, recurrent  . Neuropathy   . Pneumonia   . PUD (peptic ulcer disease)   . Renal disorder   . S/P arthroscopic knee surgery 07/04/2011  . Scoliosis 07/11/2012  . Seizure [facility])     Social History Social History   Tobacco Use  . Smoking status: Current Every Day Smoker    Packs/day: 1.00    Years: 20.00    Pack years: 20.00    Types: Cigarettes  . Smokeless tobacco: Never Used  Substance Use Topics  . Alcohol use: No  . Drug use: No    Family History Family History  Problem Relation Age of Onset  . Cancer Father   . Alcohol abuse Father   . Diabetes Father   . Diabetes Paternal Grandfather    . Pseudochol deficiency Neg Hx   . Malignant hyperthermia Neg Hx   . Hypotension Neg Hx   . Anesthesia problems Neg Hx   . Colon cancer Neg Hx     Surgical History Past Surgical History:  Procedure Laterality Date  . AV FISTULA PLACEMENT Right 04/09/2017   Procedure: ARTERIOVENOUS (AV) FISTULA CREATION RIGHT ARM;  Surgeon: Rosetta Posner, MD;  Location: [place];  Service: Vascular;  Laterality: Right;  . BASCILIC VEIN TRANSPOSITION Left 09/27/2018   Procedure: BASILIC VEIN TRANSPOSITION Left Arm;  Surgeon: Angelia Mould, MD;  Location: [place];  Service: Vascular;  Laterality: Left;  . BIOPSY  06/17/2013   Procedure: GASTRIC ULCER AND ANTRAL BIOPSIES; ESOPHAGEAL BRUSHING;  Surgeon: Danie Binder, MD;  Location: [facility];  Service: Endoscopy;;  . BIOPSY N/A 12/23/2013   Procedure: ESOPHAGEAL BIOPSY;  Surgeon: Daneil Dolin, MD;  Location: [facility];  Service: Endoscopy;  Laterality: N/A;  . CHOLECYSTECTOMY N/A 01/28/2014   Procedure: LAPAROSCOPIC CHOLECYSTECTOMY;  Surgeon: Jamesetta So, MD;  Location: [facility];  Service: General;  Laterality: N/A;  . ESOPHAGOGASTRODUODENOSCOPY (EGD) WITH PROPOFOL  06/17/2013   Dr. Oneida Alar: two gastric ulcers in fundus, mild antral gastritis, candida esophagitis  . ESOPHAGOGASTRODUODENOSCOPY (EGD) WITH PROPOFOL N/A 09/11/2013   Dr. [name]: abnormal hypopharynx, question massively enlarged tonsils, stomach full of food precluded exam, needs EGD for verification of ulcer healing at a later date  . ESOPHAGOGASTRODUODENOSCOPY (EGD) WITH PROPOFOL N/A 12/23/2013   RMR: Severe exudative esophagitis likely predominatly reflux related. Superimposed Candida infection not excluded status post KOH brushing and biopsy. Localized excoriating gastric mucosa most consistant with trauma(vomiting). No evidence of peptic ulcer disease or other gastric/duodenal pathology. I suspect severe inflammation involving the distal esophagus may account  for at least some of patii  . EYE SURGERY     . INCISION AND DRAINAGE ABSCESS N/A 10/19/2017   Procedure: INCISION AND DRAINAGE ABSCESS SCROTAL EXPLORATION,WASHOUT, [place] OF TISSUE;  Surgeon: Ardis Hughs, MD;  Location: [place];  Service: Urology;  Laterality: N/A;  . IR FLUORO GUIDE CV LINE RIGHT  05/03/2018  . IR US GUIDE VASC ACCESS RIGHT  05/03/2018  . IRRIGATION AND DEBRIDEMENT ABSCESS N/A 10/21/2017   Procedure: IRRIGATION AND DEBRIDEMENT ABSCESS;  Surgeon: Ardis Hughs, MD;  Location: [place];  Service: Urology;  Laterality: N/A;  . KNEE ARTHROSCOPY  06/30/2011   Procedure: ARTHROSCOPY KNEE;  Surgeon: Carole Civil, MD;  Location: [facility];  Service: Orthopedics;  Laterality: Right;  diagnostic arthroscopy  . TYMPANOSTOMY TUBE PLACEMENT    . WISDOM TOOTH EXTRACTION      Allergies  Allergen Reactions  . Hydrocodone Bitartrate Er Anaphylaxis    Anaphylaxis Can take plain Tylenol  . Tramadol Anaphylaxis, Nausea Only and Other (See Comments)    Acid Reflux  . Ibuprofen Nausea And Vomiting and Other (See Comments)    Stomach ulcers  . Naproxen Other (See Comments)    Stomach ulcers  . Sulfa Antibiotics Rash and Other (See Comments)    Stomach ulcers   . Ketorolac Other (See Comments)    Unknown  . Nsaids Nausea And Vomiting    Current Outpatient Medications  Medication Sig Dispense Refill  . Buprenorphine HCl-Naloxone HCl 8-2 MG FILM Place 1 Film under the tongue 3 (three) times daily.    Marland Kitchen dicyclomine (BENTYL) 10 MG capsule Take 10 mg by mouth 4 (four) times daily as needed for spasms (abdominal pain).    . DULoxetine (CYMBALTA) 30 MG capsule Take 30 mg by mouth 2 (two) times daily.    . famotidine (PEPCID) 20 MG tablet Take 20 mg by mouth 2 (two) times daily.    . furosemide (LASIX) 40 MG tablet Take 40 mg by mouth daily.   1  . insulin aspart (NOVOLOG FLEXPEN) 100 UNIT/ML FlexPen Inject 5-10 Units into the skin 3 (three) times daily with meals. Per sliding scale    . labetalol (NORMODYNE) 200 MG  tablet Take 1 tablet (200 mg total) by mouth 3 (three) times daily. (Patient taking differently: Take 200 mg by mouth 2 (two) times daily. ) 60 tablet 0  . LEVEMIR FLEXTOUCH 100 UNIT/ML Pen Inject 20 units Twice a day. (Patient taking differently: Inject 20 Units into the skin 2 (two) times daily. ) 15 mL 11  . lisinopril (ZESTRIL) 2.5 MG tablet Take 2.5 mg by mouth daily.    . Metoclopramide HCl 5 MG TBDP Take 5 mg by mouth 4 (four) times daily -  before meals and at bedtime.     . ondansetron (ZOFRAN) 4 MG tablet Take 4 mg by mouth every 6 (six)  hours as needed for nausea or vomiting.    . pantoprazole (PROTONIX) 40 MG tablet Take 40 mg by mouth daily.    . promethazine (PHENERGAN) 25 MG tablet Take 25 mg by mouth every 6 (six) hours as needed for nausea or vomiting.   1  . tiZANidine (ZANAFLEX) 4 MG tablet Take 4 mg by mouth every 6 (six) hours as needed for muscle spasms.     . TRUE METRIX BLOOD GLUCOSE TEST test strip USE TO test THREE TIMES DAILY     No current facility-administered medications for this visit.      REVIEW OF SYSTEMS: see HPI for pertinent positives and negatives    PHYSICAL EXAMINATION:  Vitals:   12/23/18 1319  BP: (!) 147/90  Pulse: 74  Resp: 18  Temp: 97.7 F (36.5 C)  TempSrc: Temporal  SpO2: 99%  Weight: 192 lb (87.1 kg)  Height: 6\' 1"  (1.854 m)   Body mass index is 25.33 kg/m.  General: Young man in NAD HEENT:  No gross abnormalities Pulmonary: Respirations are non-labored Abdomen: Soft and non-tender  Musculoskeletal: There are no major deformities.   Neurologic: No focal weakness or paresthesias are detected, bilateral hand grip strength is 5/5 Skin: There are no ulcer or rashes noted. Psychiatric: The patient has normal affect. Cardiovascular: There is a regular rate and rhythm. Bilateral radial pulses are 2+ palpable. Left upper arm AVF with brisk bruit throughout, brisk thrill more distally.   Non-Invasive Vascular Imaging  Left upper  arm Access Duplex  (Date: 12/23/2018): Findings: +--------------------+----------+-----------------+--------+ AVF                 PSV (cm/s)Flow Vol (mL/min)Comments +--------------------+----------+-----------------+--------+ Native artery inflow   169           466                +--------------------+----------+-----------------+--------+ AVF Anastomosis        445                              +--------------------+----------+-----------------+--------+  +------------+----------+-------------+----------+--------+ OUTFLOW VEINPSV (cm/s)Diameter (cm)Depth (cm)Describe +------------+----------+-------------+----------+--------+ Prox UA        136        0.89        0.89            +------------+----------+-------------+----------+--------+ Mid UA         225        0.53        0.15            +------------+----------+-------------+----------+--------+ Dist UA        622        0.47        0.19            +------------+----------+-------------+----------+--------+ AC Fossa       622        0.33        0.37            +------------+----------+-------------+----------+--------+ Summary: Patent arteriovenous fistula    Medical Decision Making  VIHAN OSTER is a 33 y.o. male who is s/p left basilic vein transposition on 09-27-18 by Dr. Scot Dock.   Left upper arm AVF duplex today shows shallow enough depths except proximally. Diameters are too small except proximally.  He states that he has been squeezing his stress ball multiple times/day in his left hand; I advised him to continue doing this.   Follow up  in 6 weeks with repeat duplex of AVF. Daily exercises of left hand by squeezing an object to increase the diameters of the AVF.     Clemon Chambers, RN, MSN, FNP-C [facility] Office: [phone]  12/23/2018, 1:35 PM  Clinic MD: Trula Slade

## 2018-12-24 ENCOUNTER — Other Ambulatory Visit: Payer: Self-pay

## 2018-12-24 DIAGNOSIS — N186 End stage renal disease: Secondary | ICD-10-CM

## 2018-12-24 DIAGNOSIS — I770 Arteriovenous fistula, acquired: Secondary | ICD-10-CM

## 2018-12-24 DIAGNOSIS — Z992 Dependence on renal dialysis: Secondary | ICD-10-CM

## 2019-01-07 DEATH — deceased

## 2019-02-03 ENCOUNTER — Ambulatory Visit: Payer: Medicare Other

## 2019-02-03 ENCOUNTER — Encounter (HOSPITAL_COMMUNITY): Payer: Medicare Other

## 2020-05-26 IMAGING — CT CT CERVICAL SPINE WITHOUT CONTRAST
5 of 8 series · 12 of 33 positions shown, 13 images · non-contrast
Comparison: None.

CLINICAL DATA: 32-year-old male found down after seizure like
activity. Head trauma.

EXAM:
CT HEAD WITHOUT CONTRAST
CT CERVICAL SPINE WITHOUT CONTRAST
TECHNIQUE: Multidetector CT imaging of the head and cervical spine was
performed following the standard protocol without intravenous
contrast. Multiplanar CT image reconstructions of the cervical spine
were also generated.

[Series 4: head bone · axial · 0.47mm/px · z∈[-66,-6]mm · 2 of 92 slices shown]
[im 31/92  bone]
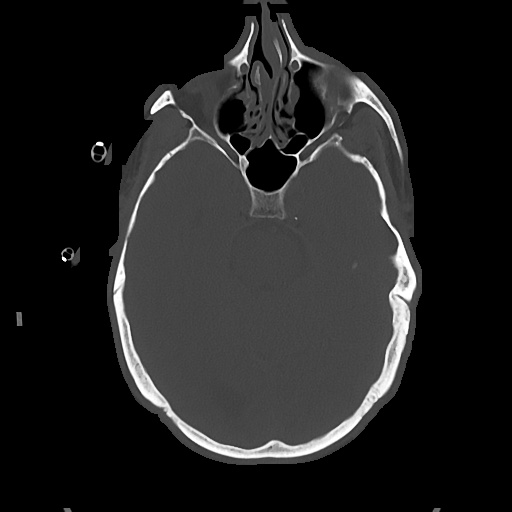
[im 61/92  bone]
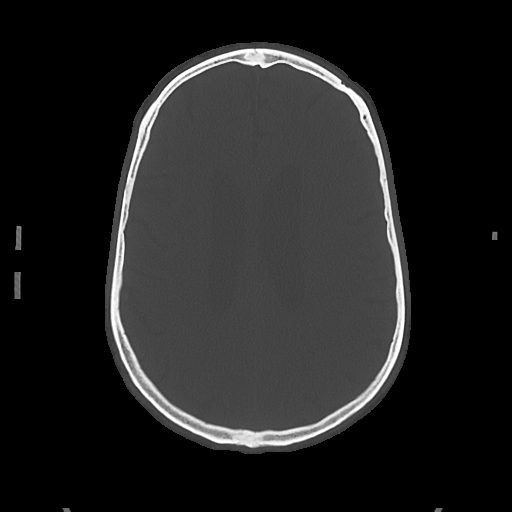

[Series 9: c spine soft · axial · 0.26mm/px · z∈[-211,-145]mm · 2 of 99 slices shown]
[im 33/99  soft-tissue]
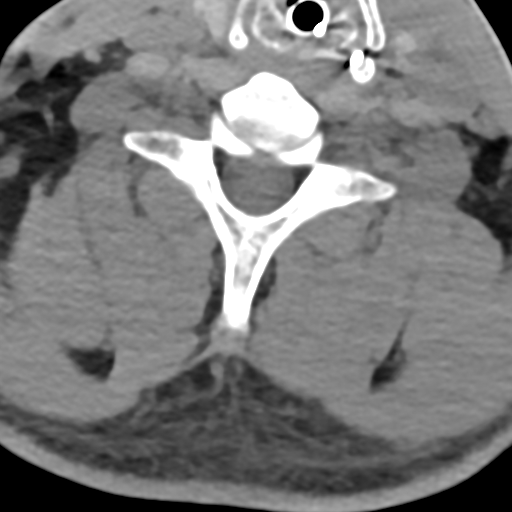
[im 66/99  soft-tissue]
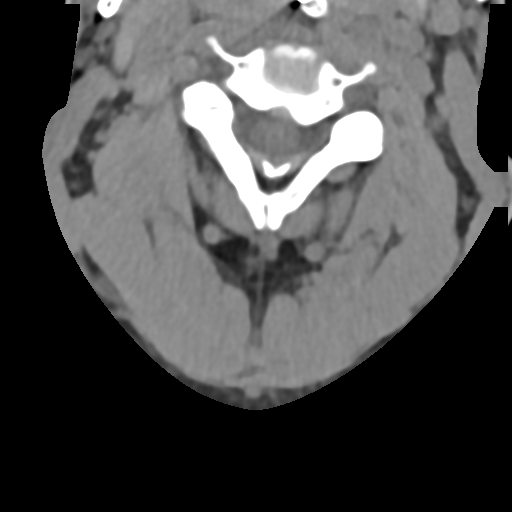

[Series 10: sag bone · sagittal · 0.29mm/px · 5 of 61 slices shown]
[im 11/61  bone]
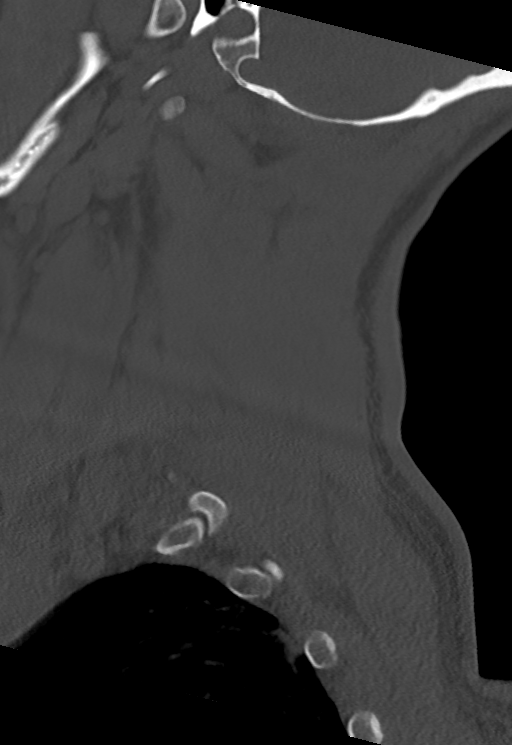
[im 21/61  bone]
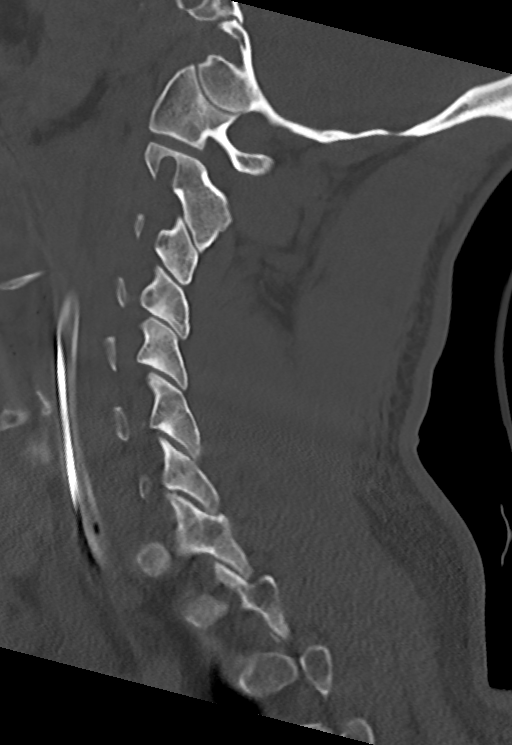
[im 31/61  bone]
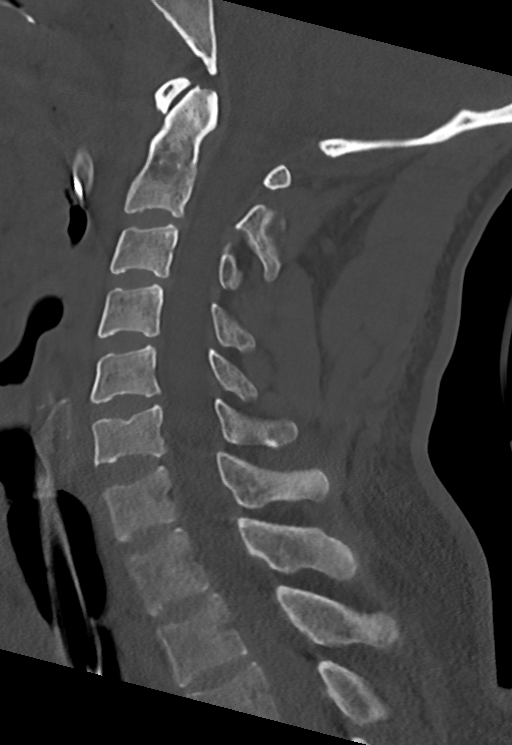
[im 41/61  bone]
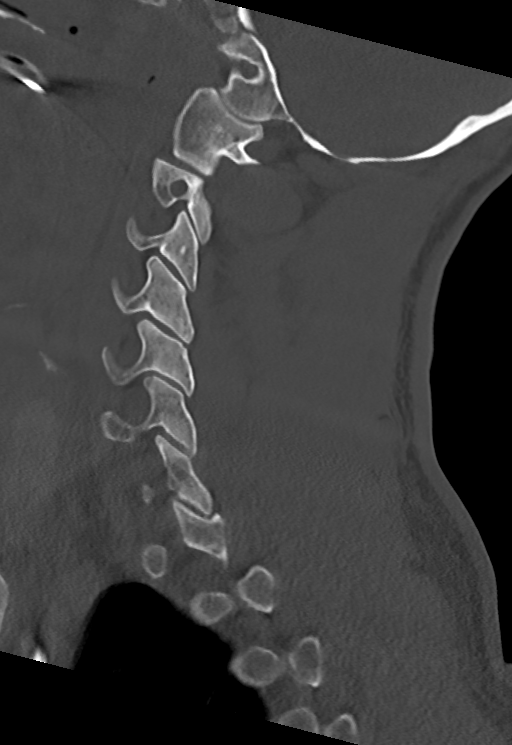
[im 51/61  bone]
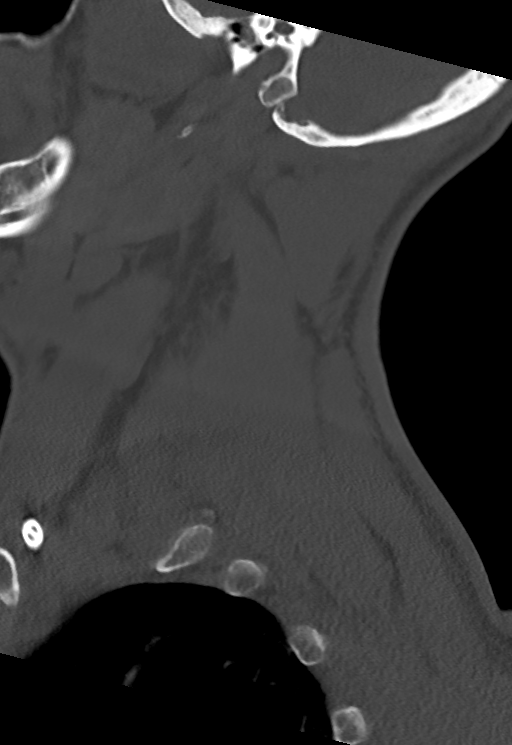

[Series 11: cor bone · coronal · 0.24mm/px · 1 of 61 slices shown]
[im 31/61  bone]
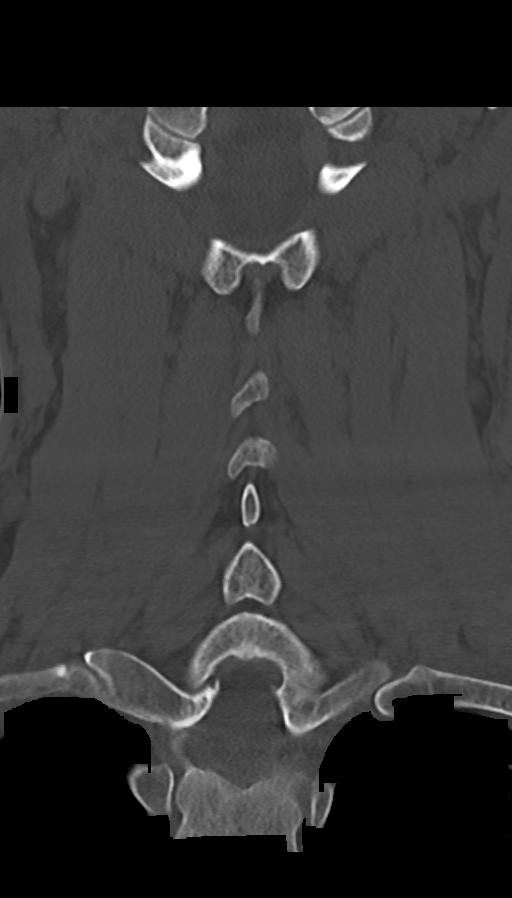

[Series 12: orthogonal axials · axial · 0.21mm/px · z∈[-234,-164]mm · 2 of 104 slices shown, 3 images]
[im 35/104  soft-tissue]
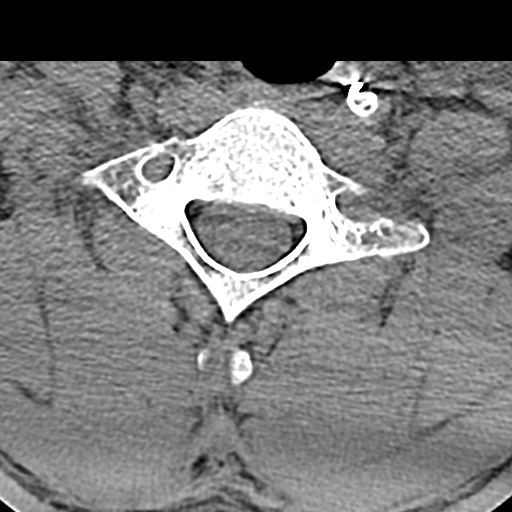
[im 35/104  bone]
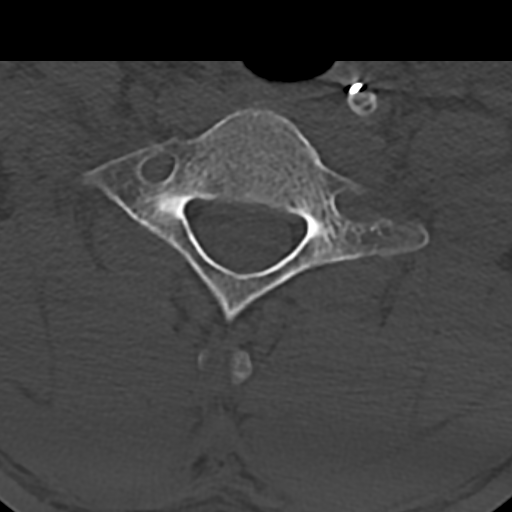
[im 69/104  bone]
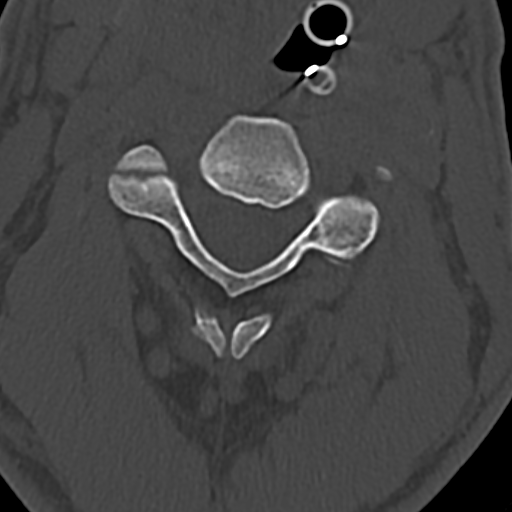

[12 of 33 positions shown; findings below may reference images not displayed]

FINDINGS: CT HEAD FINDINGS

Brain: Prominent lateral ventricle size with a degree of
colpocephaly although the temporal horns remain fairly diminutive.
Third and especially 4th ventricle size is within normal limits. No
evidence of transependymal edema.

No midline shift, mass effect, or evidence of intracranial mass
lesion. Normal basilar cisterns.

No acute intracranial hemorrhage identified. Gray-white matter
differentiation is within normal limits throughout the brain. No
cortically based acute infarct identified.

Vascular: No suspicious intracranial vascular hyperdensity.

Skull: Intact.

Sinuses/Orbits: Visualized paranasal sinuses are well pneumatized.
The tympanic cavities are clear, but there is partial left mastoid
opacification with chronic appearing mastoid coalescence and
sclerosis. The right mastoids are clear.

Other: Intubated on the scout view with fluid in the pharynx.

Orbit and scalp soft tissues appear within normal limits.

CT CERVICAL SPINE FINDINGS

Alignment: Normal cervical lordosis. Cervicothoracic junction
alignment is within normal limits. Bilateral posterior element
alignment is within normal limits.

Skull base and vertebrae: Visualized skull base is intact. No
atlanto-occipital dissociation. No acute osseous abnormality
identified.

Soft tissues and spinal canal: No prevertebral fluid or swelling. No
visible canal hematoma. Endotracheal tube courses appropriately into
the trachea, tip not included. Enteric tube courses into the
esophagus. Negative noncontrast neck soft tissues.

Disc levels:  Minor endplate spurring.

Upper chest: Visible upper thoracic levels appear grossly intact.
Partially visible dual lumen right IJ approach catheter. There is
confluent dependent opacity in the left lung apex on series 8, image
95. Negative right lung apex.
IMPRESSION: 1. No acute traumatic injury identified in the head or cervical
spine.
2. Ventricular prominence with Colpocephaly, but otherwise negative
noncontrast CT appearance of the brain.
3. Dependent opacity in the left upper lobe. Favor atelectasis over
aspiration or pneumonia.
4. Visible ET and enteric tubes placed as expected.
5. Chronic postinflammatory changes of the left mastoid air cells.

## 2020-05-27 IMAGING — DX PORTABLE CHEST - 1 VIEW
1 series · 1 of 1 positions shown · non-contrast
Comparison: 06/26/2018

CLINICAL DATA: Fever and hypertension

EXAM:
PORTABLE CHEST 1 VIEW

[chest]
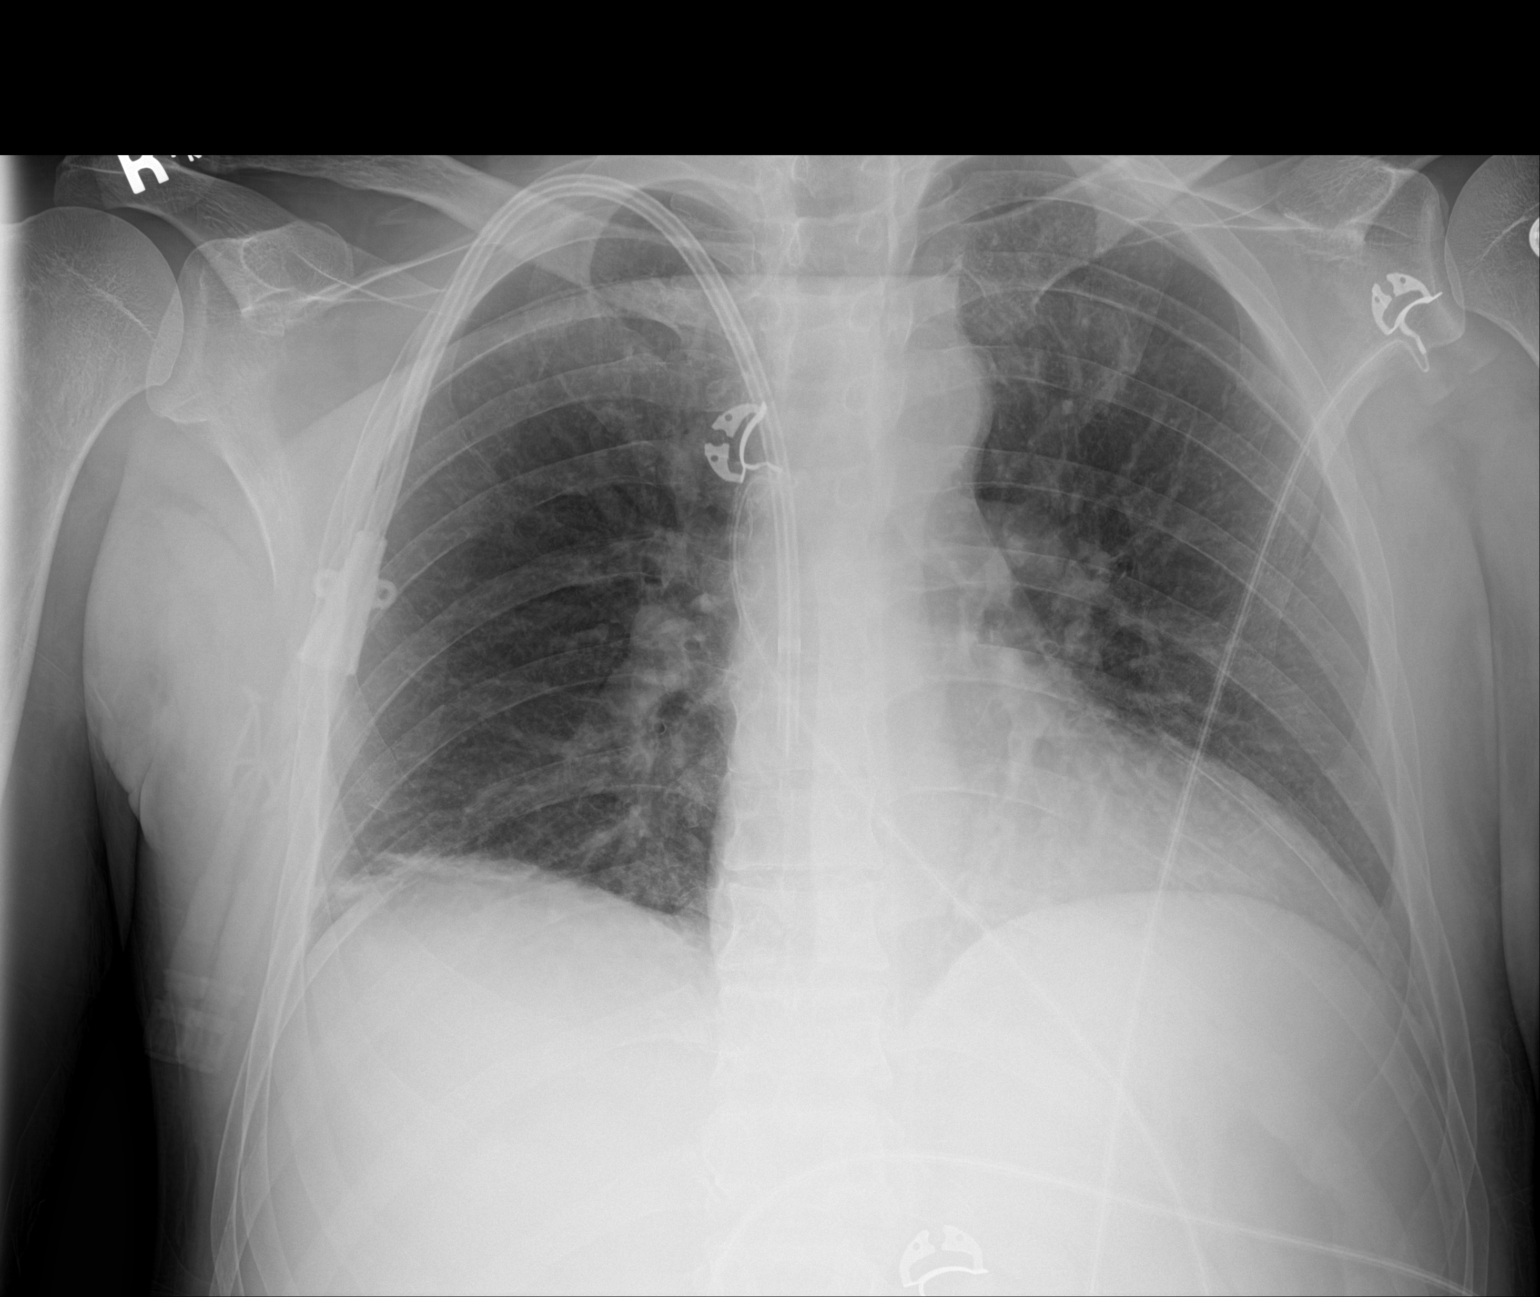

[1 of 1 positions shown; findings below may reference images not displayed]

FINDINGS: Endotracheal tube and nasogastric catheter have been removed.
Dialysis catheter is again seen in satisfactory position. Lungs are
well aerated bilaterally. Mild right basilar atelectasis is seen. No
acute bony abnormality is noted. Cardiac shadow is within normal
limits.
IMPRESSION: Mild right basilar atelectasis.

## 2020-05-28 IMAGING — DX PORTABLE ABDOMEN - 1 VIEW
2 series · 2 of 2 positions shown · non-contrast
Comparison: None.

CLINICAL DATA: Abdominal pain

EXAM:
PORTABLE ABDOMEN - 1 VIEW

[abdomen kub (1 of 2)]
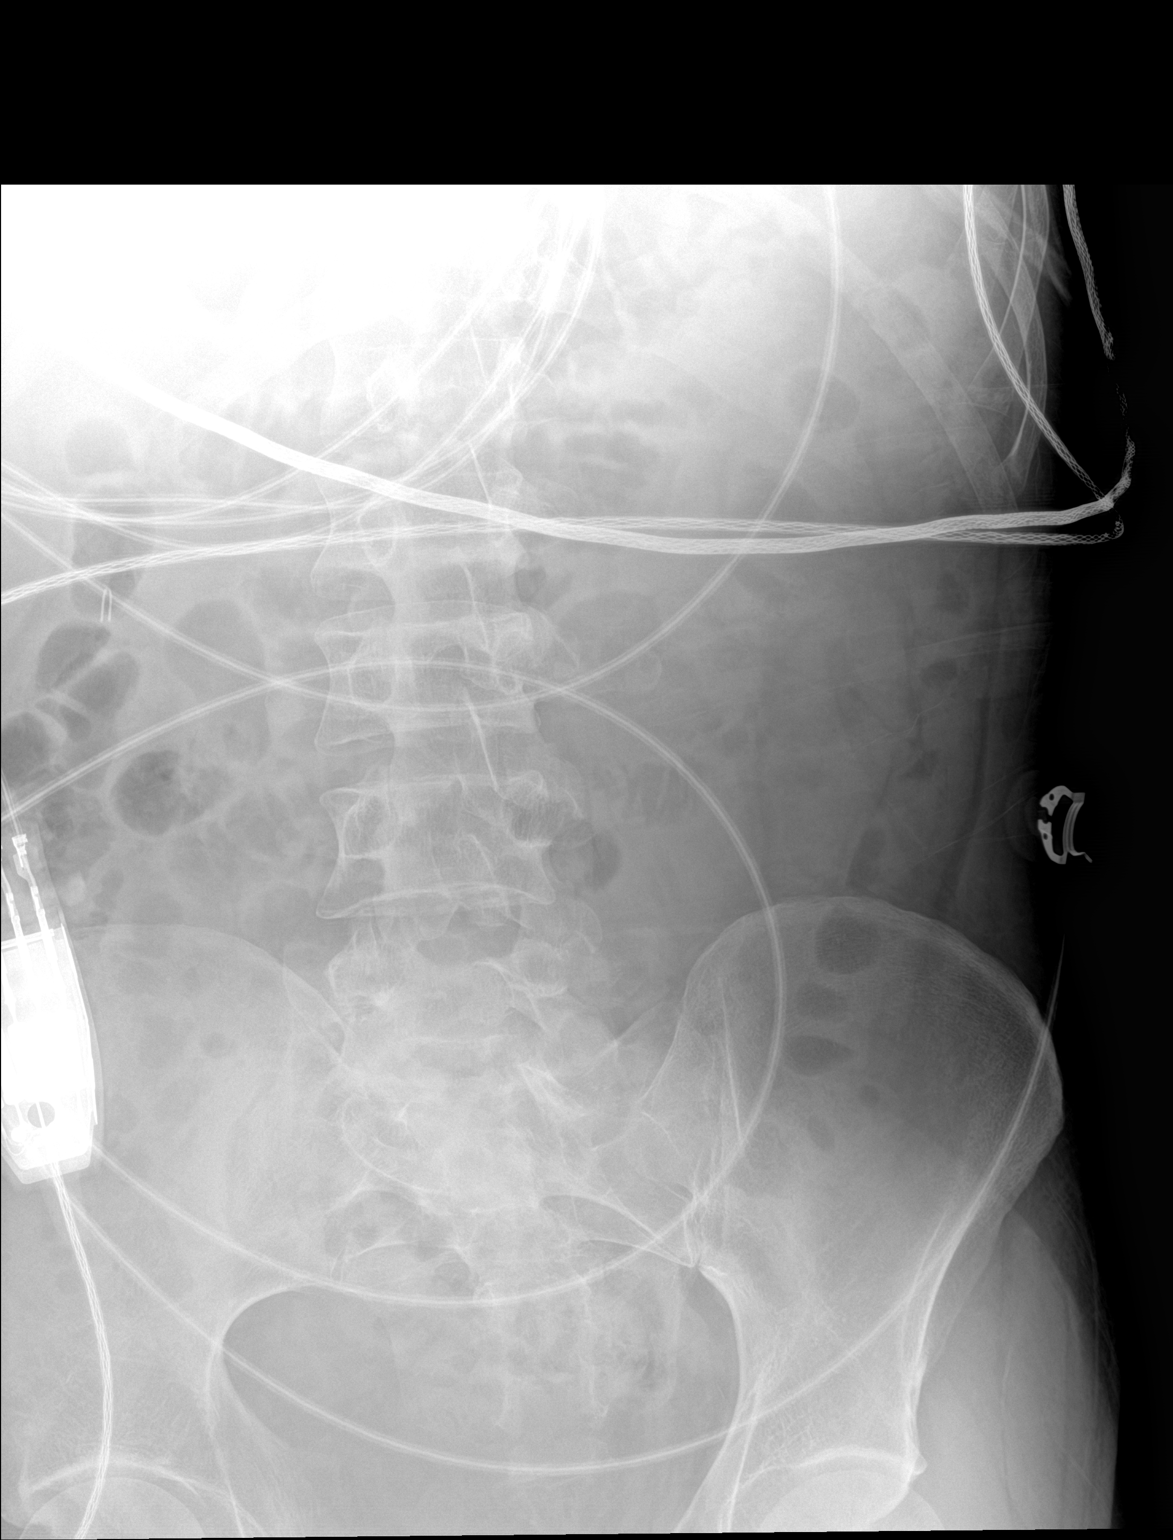

[abdomen kub (2 of 2)]
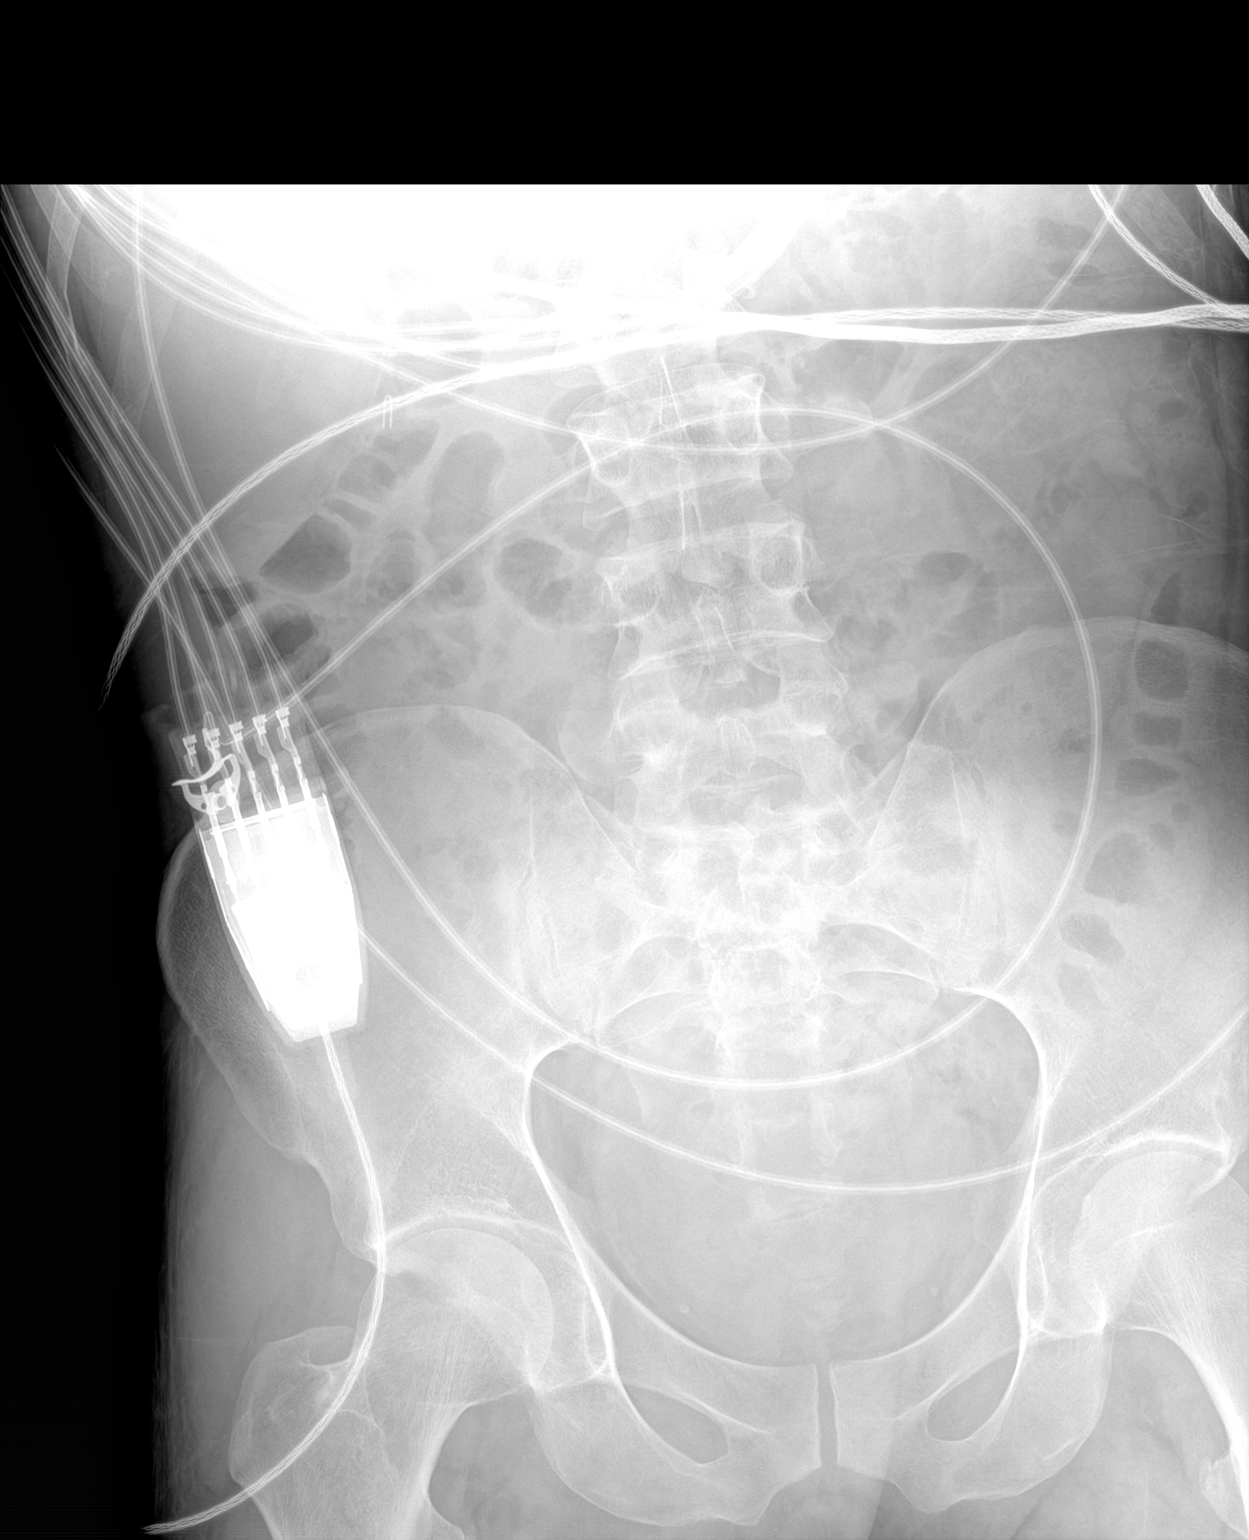

[2 of 2 positions shown; findings below may reference images not displayed]

FINDINGS: There is no appreciable bowel dilatation or air-fluid level to
suggest bowel obstruction. No free air. There are surgical clips in
the right abdomen.
IMPRESSION: No bowel obstruction or free air evident.

## 2020-05-30 IMAGING — DX ORTHOPANTOGRAM/PANORAMIC
1 series · 1 of 1 positions shown · non-contrast
Comparison: None.

CLINICAL DATA: Fever.  Possible infection.

EXAM:
ORTHOPANTOGRAM/PANORAMIC

[view not recorded]
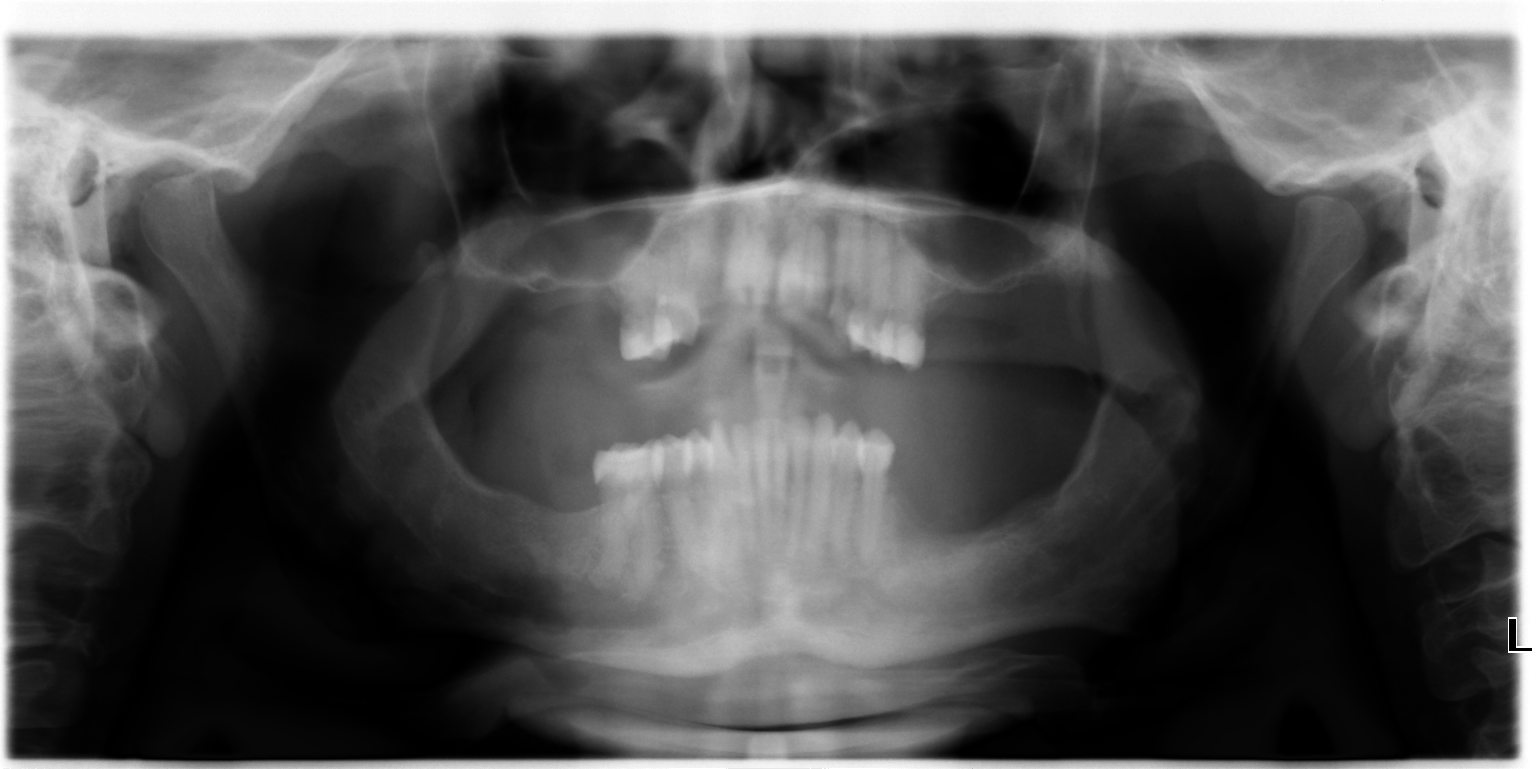

[1 of 1 positions shown; findings below may reference images not displayed]

FINDINGS: No evidence for mandibular fracture. Temporomandibular joints appear
located. Maxillary sinuses are clear. No evidence for period a coal
lucency associated with any of the lower teeth. No definite lucency
seen around the roots of the upper teeth although superimposed bony
anatomy hinders assessment.
IMPRESSION: No definite findings to suggest periapical abscess.
